# Patient Record
Sex: Female | Born: 1960 | Hispanic: No | Marital: Married | Smoking: Former smoker
Health system: Southern US, Community
[De-identification: ages and names within clinical notes are randomized; demographics above are authoritative.]

## PROBLEM LIST (undated history)

## (undated) DIAGNOSIS — G4700 Insomnia, unspecified: Secondary | ICD-10-CM

## (undated) DIAGNOSIS — R740 Nonspecific elevation of levels of transaminase and lactic acid dehydrogenase [LDH]: Secondary | ICD-10-CM

## (undated) DIAGNOSIS — L709 Acne, unspecified: Secondary | ICD-10-CM

## (undated) DIAGNOSIS — A64 Unspecified sexually transmitted disease: Secondary | ICD-10-CM

## (undated) DIAGNOSIS — F329 Major depressive disorder, single episode, unspecified: Secondary | ICD-10-CM

## (undated) DIAGNOSIS — D649 Anemia, unspecified: Secondary | ICD-10-CM

## (undated) DIAGNOSIS — F5000 Anorexia nervosa, unspecified: Secondary | ICD-10-CM

## (undated) HISTORY — DX: Major depressive disorder, single episode, unspecified: F32.9

## (undated) HISTORY — PX: BUNIONECTOMY: SHX129

## (undated) HISTORY — DX: Nonspecific elevation of levels of transaminase and lactic acid dehydrogenase (ldh): R74.0

## (undated) HISTORY — DX: Anorexia nervosa, unspecified: F50.00

## (undated) HISTORY — DX: Acne, unspecified: L70.9

## (undated) HISTORY — PX: DILITATION & CURRETTAGE/HYSTROSCOPY WITH HYDROTHERMAL ABLATION: SHX5570

## (undated) HISTORY — PX: BREAST ENHANCEMENT SURGERY: SHX7

## (undated) HISTORY — DX: Unspecified sexually transmitted disease: A64

## (undated) HISTORY — DX: Anemia, unspecified: D64.9

## (undated) HISTORY — DX: Insomnia, unspecified: G47.00

## (undated) HISTORY — PX: AUGMENTATION MAMMAPLASTY: SUR837

---

## 2004-01-31 ENCOUNTER — Other Ambulatory Visit: Admission: RE | Admit: 2004-01-31 | Discharge: 2004-01-31 | Payer: Self-pay | Admitting: Obstetrics and Gynecology

## 2004-03-11 ENCOUNTER — Encounter: Admission: RE | Admit: 2004-03-11 | Discharge: 2004-03-11 | Payer: Self-pay | Admitting: Obstetrics and Gynecology

## 2004-04-09 ENCOUNTER — Ambulatory Visit (HOSPITAL_COMMUNITY): Admission: RE | Admit: 2004-04-09 | Discharge: 2004-04-09 | Payer: Self-pay | Admitting: Obstetrics and Gynecology

## 2004-04-09 ENCOUNTER — Encounter (INDEPENDENT_AMBULATORY_CARE_PROVIDER_SITE_OTHER): Payer: Self-pay | Admitting: *Deleted

## 2004-06-04 ENCOUNTER — Ambulatory Visit (HOSPITAL_COMMUNITY): Admission: RE | Admit: 2004-06-04 | Discharge: 2004-06-04 | Payer: Self-pay | Admitting: Family Medicine

## 2005-03-27 ENCOUNTER — Other Ambulatory Visit: Admission: RE | Admit: 2005-03-27 | Discharge: 2005-03-27 | Payer: Self-pay | Admitting: Obstetrics and Gynecology

## 2005-05-16 ENCOUNTER — Encounter: Admission: RE | Admit: 2005-05-16 | Discharge: 2005-05-16 | Payer: Self-pay | Admitting: Obstetrics and Gynecology

## 2005-11-01 ENCOUNTER — Emergency Department (HOSPITAL_COMMUNITY): Admission: EM | Admit: 2005-11-01 | Discharge: 2005-11-01 | Payer: Self-pay | Admitting: Emergency Medicine

## 2006-08-26 ENCOUNTER — Encounter: Admission: RE | Admit: 2006-08-26 | Discharge: 2006-08-26 | Payer: Self-pay | Admitting: Obstetrics and Gynecology

## 2007-09-21 ENCOUNTER — Encounter: Admission: RE | Admit: 2007-09-21 | Discharge: 2007-09-21 | Payer: Self-pay | Admitting: Obstetrics and Gynecology

## 2007-10-01 ENCOUNTER — Encounter: Admission: RE | Admit: 2007-10-01 | Discharge: 2007-10-01 | Payer: Self-pay | Admitting: Obstetrics and Gynecology

## 2007-12-27 ENCOUNTER — Encounter: Payer: Self-pay | Admitting: Obstetrics and Gynecology

## 2007-12-27 ENCOUNTER — Ambulatory Visit (HOSPITAL_BASED_OUTPATIENT_CLINIC_OR_DEPARTMENT_OTHER): Admission: RE | Admit: 2007-12-27 | Discharge: 2007-12-27 | Payer: Self-pay | Admitting: Obstetrics and Gynecology

## 2007-12-27 HISTORY — PX: HYSTEROSCOPY: SHX211

## 2007-12-27 HISTORY — PX: ENDOMETRIAL ABLATION: SHX621

## 2008-05-10 LAB — HM MAMMOGRAPHY: HM Mammogram: NORMAL

## 2008-11-03 ENCOUNTER — Encounter: Admission: RE | Admit: 2008-11-03 | Discharge: 2008-11-03 | Payer: Self-pay | Admitting: Obstetrics and Gynecology

## 2008-11-26 LAB — CONVERTED CEMR LAB: Pap Smear: NORMAL

## 2009-03-04 ENCOUNTER — Emergency Department (HOSPITAL_COMMUNITY): Admission: EM | Admit: 2009-03-04 | Discharge: 2009-03-04 | Payer: Self-pay | Admitting: Emergency Medicine

## 2009-08-09 ENCOUNTER — Ambulatory Visit: Payer: Self-pay | Admitting: Family Medicine

## 2009-08-09 DIAGNOSIS — F3289 Other specified depressive episodes: Secondary | ICD-10-CM

## 2009-08-09 DIAGNOSIS — F329 Major depressive disorder, single episode, unspecified: Secondary | ICD-10-CM | POA: Insufficient documentation

## 2009-08-09 HISTORY — DX: Major depressive disorder, single episode, unspecified: F32.9

## 2009-08-09 HISTORY — DX: Other specified depressive episodes: F32.89

## 2009-08-13 ENCOUNTER — Telehealth: Payer: Self-pay | Admitting: Family Medicine

## 2009-08-13 LAB — CONVERTED CEMR LAB
ALT: 70 units/L — ABNORMAL HIGH (ref 0–35)
AST: 41 units/L — ABNORMAL HIGH (ref 0–37)
Albumin: 4 g/dL (ref 3.5–5.2)
Alkaline Phosphatase: 98 units/L (ref 39–117)
BUN: 13 mg/dL (ref 6–23)
Basophils Absolute: 0 10*3/uL (ref 0.0–0.1)
Basophils Relative: 1 % (ref 0.0–3.0)
Bilirubin, Direct: 0.1 mg/dL (ref 0.0–0.3)
CO2: 31 meq/L (ref 19–32)
Calcium: 9.4 mg/dL (ref 8.4–10.5)
Chloride: 98 meq/L (ref 96–112)
Cholesterol: 181 mg/dL (ref 0–200)
Creatinine, Ser: 0.6 mg/dL (ref 0.4–1.2)
Eosinophils Absolute: 0 10*3/uL (ref 0.0–0.7)
Eosinophils Relative: 0.9 % (ref 0.0–5.0)
GFR calc non Af Amer: 113.73 mL/min (ref >60)
Glucose, Bld: 84 mg/dL (ref 70–99)
HCT: 36.9 % (ref 36.0–46.0)
HDL: 29.6 mg/dL — ABNORMAL LOW (ref >39.00)
Hemoglobin: 12.9 g/dL (ref 12.0–15.0)
LDL Cholesterol: 136 mg/dL — ABNORMAL HIGH (ref 0–99)
Lymphocytes Relative: 29.8 % (ref 12.0–46.0)
Lymphs Abs: 1.4 10*3/uL (ref 0.7–4.0)
MCHC: 34.9 g/dL (ref 30.0–36.0)
MCV: 98 fL (ref 78.0–100.0)
Monocytes Absolute: 0.4 10*3/uL (ref 0.1–1.0)
Monocytes Relative: 8.1 % (ref 3.0–12.0)
Neutro Abs: 2.9 10*3/uL (ref 1.4–7.7)
Neutrophils Relative %: 60.2 % (ref 43.0–77.0)
Platelets: 483 10*3/uL — ABNORMAL HIGH (ref 150.0–400.0)
Potassium: 4 meq/L (ref 3.5–5.1)
RBC: 3.77 M/uL — ABNORMAL LOW (ref 3.87–5.11)
RDW: 13.5 % (ref 11.5–14.6)
Sodium: 138 meq/L (ref 135–145)
TSH: 1.75 microintl units/mL (ref 0.35–5.50)
Total Bilirubin: 0.3 mg/dL (ref 0.3–1.2)
Total CHOL/HDL Ratio: 6
Total Protein: 7.5 g/dL (ref 6.0–8.3)
Triglycerides: 78 mg/dL (ref 0.0–149.0)
VLDL: 15.6 mg/dL (ref 0.0–40.0)
WBC: 4.8 10*3/uL (ref 4.5–10.5)

## 2009-08-14 ENCOUNTER — Ambulatory Visit: Payer: Self-pay | Admitting: Family Medicine

## 2009-08-14 DIAGNOSIS — R740 Nonspecific elevation of levels of transaminase and lactic acid dehydrogenase [LDH]: Secondary | ICD-10-CM

## 2009-08-14 DIAGNOSIS — R7401 Elevation of levels of liver transaminase levels: Secondary | ICD-10-CM

## 2009-08-14 DIAGNOSIS — R21 Rash and other nonspecific skin eruption: Secondary | ICD-10-CM

## 2009-08-14 DIAGNOSIS — R7402 Elevation of levels of lactic acid dehydrogenase (LDH): Secondary | ICD-10-CM

## 2009-08-14 HISTORY — DX: Elevation of levels of lactic acid dehydrogenase (LDH): R74.02

## 2009-08-14 HISTORY — DX: Elevation of levels of liver transaminase levels: R74.01

## 2009-08-24 ENCOUNTER — Ambulatory Visit: Payer: Self-pay | Admitting: Family Medicine

## 2009-08-27 LAB — CONVERTED CEMR LAB
ALT: 39 units/L — ABNORMAL HIGH (ref 0–35)
AST: 37 units/L (ref 0–37)
Albumin: 3.8 g/dL (ref 3.5–5.2)
Alkaline Phosphatase: 66 units/L (ref 39–117)
Bilirubin, Direct: 0 mg/dL (ref 0.0–0.3)
Total Bilirubin: 0.4 mg/dL (ref 0.3–1.2)
Total Protein: 7.2 g/dL (ref 6.0–8.3)

## 2009-12-09 LAB — HM PAP SMEAR: HM Pap smear: NORMAL

## 2010-01-08 ENCOUNTER — Ambulatory Visit: Payer: Self-pay | Admitting: Family Medicine

## 2010-01-08 DIAGNOSIS — N390 Urinary tract infection, site not specified: Secondary | ICD-10-CM

## 2010-01-08 DIAGNOSIS — B373 Candidiasis of vulva and vagina: Secondary | ICD-10-CM

## 2010-01-08 LAB — CONVERTED CEMR LAB
Bilirubin Urine: NEGATIVE
Blood in Urine, dipstick: NEGATIVE
Glucose, Urine, Semiquant: NEGATIVE
Ketones, urine, test strip: NEGATIVE
Nitrite: NEGATIVE
Protein, U semiquant: NEGATIVE
Specific Gravity, Urine: 1.015
Urobilinogen, UA: 0.2
WBC Urine, dipstick: NEGATIVE
pH: 7

## 2010-01-09 ENCOUNTER — Encounter: Payer: Self-pay | Admitting: Family Medicine

## 2010-01-11 ENCOUNTER — Encounter: Payer: Self-pay | Admitting: Family Medicine

## 2010-01-14 ENCOUNTER — Encounter: Payer: Self-pay | Admitting: Family Medicine

## 2010-01-17 ENCOUNTER — Telehealth: Payer: Self-pay | Admitting: Family Medicine

## 2010-01-24 ENCOUNTER — Encounter: Admission: RE | Admit: 2010-01-24 | Discharge: 2010-01-24 | Payer: Self-pay | Admitting: Obstetrics and Gynecology

## 2010-02-07 ENCOUNTER — Ambulatory Visit: Payer: Self-pay | Admitting: Family Medicine

## 2010-02-07 LAB — CONVERTED CEMR LAB
Bilirubin Urine: NEGATIVE
Blood in Urine, dipstick: NEGATIVE
Glucose, Urine, Semiquant: NEGATIVE
Ketones, urine, test strip: NEGATIVE
Nitrite: NEGATIVE
Protein, U semiquant: NEGATIVE
Specific Gravity, Urine: 1.01
Urobilinogen, UA: 0.2
WBC Urine, dipstick: NEGATIVE
pH: 7

## 2010-02-23 ENCOUNTER — Ambulatory Visit: Payer: Self-pay | Admitting: Internal Medicine

## 2010-02-23 LAB — CONVERTED CEMR LAB
Blood in Urine, dipstick: NEGATIVE
Glucose, Urine, Semiquant: NEGATIVE
Ketones, urine, test strip: NEGATIVE
Nitrite: NEGATIVE
Protein, U semiquant: 30
Specific Gravity, Urine: 1.015
Urobilinogen, UA: 0.2
WBC Urine, dipstick: NEGATIVE
pH: 6.5

## 2010-05-02 ENCOUNTER — Encounter
Admission: RE | Admit: 2010-05-02 | Discharge: 2010-05-02 | Payer: Self-pay | Source: Home / Self Care | Attending: Gastroenterology | Admitting: Gastroenterology

## 2010-05-02 ENCOUNTER — Encounter: Payer: Self-pay | Admitting: Family Medicine

## 2010-05-19 ENCOUNTER — Encounter: Payer: Self-pay | Admitting: Obstetrics and Gynecology

## 2010-05-28 NOTE — Assessment & Plan Note (Signed)
Summary: RASH // RS   Vital Signs:  Patient profile:   50 year old female Menstrual status:  irregular Temp:     97.7 degrees F oral BP sitting:   92 / 64  (left arm) Cuff size:   regular  Vitals Entered By: Sid Falcon LPN (August 14, 2009 11:32 AM) CC: dry skin hand, groin area, reddened spots all over yesterday   History of Present Illness: Patient is seen for evaluation of rash. Approximately one week ago developed desquamating rash of her palms. This is nonpruritic and nonpainful. No mouth ulcerations. Yesterday had a different type rash which is described as some erythematous dots on her trunk which were nonpruritic and blanched with pressure. That rash is gone today.  No hx of hives. No known topical chemical exposures.  Patient does take Lamictal and Seroquel has been on these per her psychiatrist for approximately 6 months with no prior rash. Recent lab work significant for mildly elevated liver transaminases. Patient denies any fever or other problems.  Allergies: 1)  Percocet (Oxycodone-Acetaminophen) 2)  Cipro (Ciprofloxacin Hcl)  Past History:  Past Medical History: Last updated: 08/09/2009 Depression Eating disorder Hx Anorexia Nervosa  Review of Systems  The patient denies fever, chest pain, dyspnea on exertion, peripheral edema, headaches, hemoptysis, and muscle weakness.    Physical Exam  General:  alert, appropriate dress, cooperative to examination, and underweight appearing.   Eyes:  pupils equal, pupils round, and pupils reactive to light.   Ears:  External ear exam shows no significant lesions or deformities.  Otoscopic examination reveals clear canals, tympanic membranes are intact bilaterally without bulging, retraction, inflammation or discharge. Hearing is grossly normal bilaterally. Mouth:  no lesions or ulcerations noted Neck:  No deformities, masses, or tenderness noted. Lungs:  Normal respiratory effort, chest expands symmetrically. Lungs are  clear to auscultation, no crackles or wheezes. Heart:  Normal rate and regular rhythm. S1 and S2 normal without gallop, murmur, click, rub or other extra sounds. Skin:  patient has somewhat scaly rash palms of both hands. No evidence for ulceration.   punctate erythematous blanching rash which appears to be fading on her trunk. No evidence for urticaria.  no petechiae. Cervical Nodes:  No lymphadenopathy noted   Impression & Recommendations:  Problem # 1:  SKIN RASH (ICD-782.1) NO evidence for toxic epidermal necrolysis.  Doubt fungal.  Will consult with her psychiatrist regarding medications.  Problem # 2:  TRANSAMINASES, SERUM, ELEVATED (ICD-790.4) mild.  ?drug related.  Pt to get rececked in 2-3 weeks.  Complete Medication List: 1)  Lamictal 200 Mg Tabs (Lamotrigine) .... Once daily 2)  Seroquel Xr 150 Mg Xr24h-tab (Quetiapine fumarate) .... As needed 3)  Ambien 10 Mg Tabs (Zolpidem tartrate) .... As needed  Patient Instructions: 1)  follow up immediately if you develop any fever, mouth ulcerations, or worsening rash. 2)  I will notify your psychiatrist to make further decisions regarding your medication therapy

## 2010-05-28 NOTE — Assessment & Plan Note (Signed)
Summary: ? uti/cjr   Vital Signs:  Patient profile:   50 year old female Menstrual status:  irregular Height:      60 inches (152.40 cm) Weight:      102.31 pounds (46.50 kg) O2 Sat:      99 % on Room air Temp:     98.4 degrees F (36.89 degrees C) oral Pulse rate:   85 / minute BP sitting:   90 / 64  (left arm) Cuff size:   regular  Vitals Entered By: Josph Macho RMA (January 08, 2010 2:09 PM)  O2 Flow:  Room air CC: Possible UTI/ CF Is Patient Diabetic? No   History of Present Illness: patient is a 50 year old Caucasian female who is in today with complaints of mild dysuria. She reports symptoms have been present for the last several days. She relays a history of very mild dysuria earlier this year which quickly turned into an episode of pyelonephritis so she is excessively anxious about her current symptoms. She describes the discomfort as mild and more on the external surface. Tthere is a mild odor associated with the urine. She does also knowledge some vaginal itching and slight increase in vaginal discharge. The vaginal discharge is slightly malodorous whitish in nature. She is married and sexually active. She acknowledges some increasing urinary urgency but denies urinary frequency hematuria or incontinence. She denies fevers, chills, abdominal pain, back pain, chest pain, malaise, myalgias.  Current Medications (verified): 1)  Cymbalta 60 Mg Cpep (Duloxetine Hcl) .... One Daily 2)  Restoril 30 Mg Caps (Temazepam) .... Once Daily  Allergies (verified): 1)  Percocet (Oxycodone-Acetaminophen) 2)  Cipro (Ciprofloxacin Hcl)  Past History:  Past medical history reviewed for relevance to current acute and chronic problems. Social history (including risk factors) reviewed for relevance to current acute and chronic problems.  Past Medical History: Reviewed history from 08/09/2009 and no changes required. Depression Eating disorder Hx Anorexia Nervosa  Social  History: Reviewed history from 08/09/2009 and no changes required. Occupation:  Disease Management Nurse Married Alcohol use-no Religion affecting care Past smoker, quit 1989  Review of Systems      See HPI  Physical Exam  General:  Well-developed,well-nourished,in no acute distress; alert,appropriate and cooperative throughout examination Mouth:  Oral mucosa and oropharynx without lesions or exudates.  Lungs:  Normal respiratory effort, chest expands symmetrically. Lungs are clear to auscultation, no crackles or wheezes. Heart:  Normal rate and regular rhythm. S1 and S2 normal without gallop, murmur, click, rub or other extra sounds. Abdomen:  Bowel sounds positive,abdomen soft and non-tender without masses, organomegaly or hernias noted. Psych:  Cognition and judgment appear intact. Alert and cooperative with normal attention span and concentration. No apparent delusions, illusions, hallucinations   Impression & Recommendations:  Problem # 1:  CANDIDIASIS, VAGINAL (ICD-112.1)  Her updated medication list for this problem includes:    Fluconazole 150 Mg Tabs (Fluconazole) .Marland Kitchen... 1 tab by mouth q week as needed candidiasis Symptoms suggestive of yeast over growth, given instructions on good hydration and probiotic supplementation  Problem # 2:  UTI (ICD-599.0)  Orders: UA Dipstick w/o Micro (automated)  (81003) T-Culture, Urine ([address]268-34196) Has a h/o UTI and some mild dysuria noted, will send urine for culture, no strong indication of UTI on dip stick today  Complete Medication List: 1)  Cymbalta 60 Mg Cpep (Duloxetine hcl) .... One daily 2)  Restoril 30 Mg Caps (Temazepam) .... Once daily 3)  Fluconazole 150 Mg Tabs (Fluconazole) .Marland Kitchen.. 1 tab by mouth  q week as needed candidiasis 4)  Align 4 Mg Caps (Probiotic product) .Marland Kitchen.. 1 cap by mouth once daily  Patient Instructions: 1)  Please schedule a follow-up appointment as needed if symptoms worsen or do not improve. 2)  Drink  plenty of fluids up to 3-4 quarts a day. Cranberry juice is especially recommended in addition to large amounts of water. Avoid caffeine & carbonated drinks, they tend to irritate the bladder, Return in 3-5 days if you're not better: sooner if you're feeling worse.  3)  Try a probiotic such as Align caps daily for the next month and as needed after that. Prescriptions: ALIGN 4 MG CAPS (PROBIOTIC PRODUCT) 1 cap by mouth once daily  #7 x 0   Entered and Authorized by:   Danise Edge MD   Signed by:   Danise Edge MD on 01/08/2010   Method used:   Samples Given   RxID:   [id number] FLUCONAZOLE 150 MG TABS (FLUCONAZOLE) 1 tab by mouth q week as needed candidiasis  #2 x 0   Entered and Authorized by:   Danise Edge MD   Signed by:   Danise Edge MD on 01/08/2010   Method used:   [facility] to        [facility]  [place] [address] [facility]retail[facility]       [address]       [place], [place]  [address]       Ph: [phone] or [phone]       Fax: [phone]   RxID:   [id number]   Laboratory Results   Urine Tests    Routine Urinalysis   Color: yellow Appearance: Clear Glucose: negative   (Normal Range: Negative) Bilirubin: negative   (Normal Range: Negative) Ketone: negative   (Normal Range: Negative) Spec. Gravity: 1.015   (Normal Range: 1.003-1.035) Blood: negative   (Normal Range: Negative) pH: 7.0   (Normal Range: 5.0-8.0) Protein: negative   (Normal Range: Negative) Urobilinogen: 0.2   (Normal Range: 0-1) Nitrite: negative   (Normal Range: Negative) Leukocyte Esterace: negative   (Normal Range: Negative)    Comments: Rita Ohara  January 08, 2010 1:40 PM

## 2010-05-28 NOTE — Progress Notes (Signed)
  Phone Note Call from Patient   Caller: Patient Call For: [name] MD Complaint: Urinary/GYN Problems Summary of Call: New phone :  [phone] Initial call taken by: Lynann Beaver CMA,  January 17, 2010 1:29 PM

## 2010-05-28 NOTE — Assessment & Plan Note (Signed)
Summary: NEW PT EST // RS   Vital Signs:  Patient profile:   50 year old female Menstrual status:  irregular LMP:     05/29/2009 Height:      60 inches Weight:      100 pounds BMI:     19.60 Temp:     97.7 degrees F oral Pulse rate:   60 / minute Pulse rhythm:   regular Resp:     12 per minute BP sitting:   98 / 70  (left arm) Cuff size:   regular  Vitals Entered By: Sid Falcon LPN (August 09, 2009 10:29 AM) CC: New to establish  LMP (date): 05/29/2009     Menstrual Status irregular Enter LMP: 05/29/2009 Last PAP Result normal   History of Present Illness:  new patient to establish care.  Past medical history reviewed. Patient has history of anorexia over 30 years ago. Has had substantial weight loss this past year she estimates about 15 pounds but her weight has stabilized. She feels comfortable at her current weight. No history of bulimia. Remote history of anemia.  Previous C-section x3. History of depression treated by psychiatrist Lamictal and Seroquel. Question of bipolar illness but she is not sure of this diagnosis.  Currently depression symptoms stable.  Patient sees GYN regularly. Mammograms through their office. Tetanus up to date. Reported history low vitamin D on replacement per GYN. Last Pap smear last August.  Family history significant father bypass graft age 52 and history of stroke. Mother with hypertension.  Patient is married has 3 children. No history of smoking. No alcohol use. Works as a Tax adviser.  Preventive Screening-Counseling & Management  Alcohol-Tobacco     Smoking Status: quit     Year Quit: 1984  Allergies (verified): 1)  Percocet (Oxycodone-Acetaminophen) 2)  Cipro (Ciprofloxacin Hcl)  Past History:  Family History: Last updated: 08/09/2009 Mother, hypertension Father, heart disease (CABG62), stroke  Social History: Last updated: 08/09/2009 Occupation:  Disease Management Nurse Married Alcohol use-no Religion  affecting care Past smoker, quit 1989  Risk Factors: Smoking Status: quit (08/09/2009)  Past Medical History: Depression Eating disorder Hx Anorexia Nervosa  Past Surgical History: Three C- Sections, 1992, 1993, 1997  Family History: Mother, hypertension Father, heart disease (CABG62), stroke  Social History: Occupation:  Disease Management Nurse Married Alcohol use-no Religion affecting care Past smoker, quit 1989 Smoking Status:  quit Occupation:  employed  Review of Systems  The patient denies fever, vision loss, decreased hearing, hoarseness, chest pain, syncope, dyspnea on exertion, peripheral edema, prolonged cough, headaches, hemoptysis, abdominal pain, melena, hematochezia, severe indigestion/heartburn, hematuria, incontinence, genital sores, muscle weakness, suspicious skin lesions, transient blindness, difficulty walking, depression, unusual weight change, abnormal bleeding, enlarged lymph nodes, angioedema, and breast masses.         weight loss as per history of present illness. Patient states his weight has stabilized over the past several weeks  Physical Exam  General:  Well-developed,well-nourished,in no acute distress; alert,appropriate and cooperative throughout examination Head:  Normocephalic and atraumatic without obvious abnormalities. No apparent alopecia or balding. Eyes:  No corneal or conjunctival inflammation noted. EOMI. Perrla. Funduscopic exam benign, without hemorrhages, exudates or papilledema. Vision grossly normal. Ears:  External ear exam shows no significant lesions or deformities.  Otoscopic examination reveals clear canals, tympanic membranes are intact bilaterally without bulging, retraction, inflammation or discharge. Hearing is grossly normal bilaterally. Mouth:  Oral mucosa and oropharynx without lesions or exudates.  Teeth in good repair. Neck:  No deformities, masses,  or tenderness noted. Lungs:  Normal respiratory effort, chest  expands symmetrically. Lungs are clear to auscultation, no crackles or wheezes. Heart:  Normal rate and regular rhythm. S1 and S2 normal without gallop, murmur, click, rub or other extra sounds. Abdomen:  Bowel sounds positive,abdomen soft and non-tender without masses, organomegaly or hernias noted. Genitalia:  gyn Msk:  No deformity or scoliosis noted of thoracic or lumbar spine.   Neurologic:  alert & oriented X3, cranial nerves II-XII intact, strength normal in all extremities, and gait normal.   Skin:  Intact without suspicious lesions or rashes Psych:  good eye contact, not anxious appearing, and not depressed appearing.     Impression & Recommendations:  Problem # 1:  Preventive Health Care (ICD-V70.0) plan screening lab work. She will continue followup with GYN. Discussed importance of stabilizing her weight and regular food intake.  tetanus up to date.  Pt has hx low Vit D and she will continue with replacement.  Complete Medication List: 1)  Lamictal 200 Mg Tabs (Lamotrigine) .... Once daily 2)  Seroquel Xr 150 Mg Xr24h-tab (Quetiapine fumarate) .... As needed 3)  Ambien 10 Mg Tabs (Zolpidem tartrate) .... As needed  Other Orders: TLB-Lipid Panel (80061-LIPID) TLB-BMP (Basic Metabolic Panel-BMET) (80048-METABOL) TLB-CBC Platelet - w/Differential (85025-CBCD) TLB-Hepatic/Liver Function Pnl (80076-HEPATIC) TLB-TSH (Thyroid Stimulating Hormone) (84443-TSH) Venipuncture ([address])  Patient Instructions: 1)  It is important that you exercise reguarly at least 20 minutes 5 times a week. If you develop chest pain, have severe difficulty breathing, or feel very tired, stop exercising immediately and seek medical attention.  2)  Take calcium +vitamin D daily.   Preventive Care Screening  Pap Smear:    Date:  11/26/2008    Results:  normal   Mammogram:    Date:  04/28/2008    Results:  normal   Last Tetanus Booster:    Date:  07/28/2007    Results:  Historical

## 2010-05-28 NOTE — Letter (Signed)
Summary: Generic Letter  [place] at [facility]  [address] [place], [place] [address]   Phone: [phone]  Fax: [phone]    01/11/2010  TYLASIA FLETCHALL [address] [place], [place]  [address]  Dear Ms. Hottel,  Our office tried to reach you and was unsuccessful. We were wanting to notify you that your urine grew yeast. You should be feeling better with the Fluconazole. If not you could give [place] another urine sample so we can retest.  Any questions please contact us at [phone]. Also please let us know of an updated phone number.     Sincerely,   Josph Macho RMA

## 2010-05-28 NOTE — Letter (Signed)
Summary: Generic Letter  [place] at [facility]  [address] [place], [place] [address]   Phone: [phone]  Fax: [phone]    01/14/2010  GERRICA CYGAN [address] [place], [place]  [address]  Dear Ms. Obyrne,  Our office has tried to contact you to let you know we received your labs. The final culture came back recommending treatment with for your UTI. Would treat with Macrobid two times a day x 5days and repeat UA in 3-4 weeks.  Please contact our office at [phone] for a lab appointment and to also update our information with the correct phone number. Also if you have any questions please feel free to contact us.      Sincerely,   Josph Macho RMA

## 2010-05-28 NOTE — Progress Notes (Signed)
Summary: labs  Phone Note Call from Patient   Caller: Patient Call For: [name] MD Summary of Call: [phone] Pt would like to schedule liver function labs in 2 weeks rather than 3 as she is starting a new job and needs to know if this is ok with Dr. Caryl Never. Initial call taken by: Lynann Beaver CMA,  August 13, 2009 9:16 AM  Follow-up for Phone Call        yes  Pt. notified. Lynann Beaver [facility]  August 13, 2009 9:45 AM  Follow-up by: [name] MD,  August 13, 2009 9:43 AM

## 2010-05-28 NOTE — Assessment & Plan Note (Signed)
Summary: UTI SYMPTOMS...AS.   Vital Signs:  Patient profile:   50 year old female Menstrual status:  irregular Height:      60 inches Weight:      105 pounds O2 Sat:      97 % Temp:     97.4 degrees F Pulse rate:   61 / minute BP sitting:   102 / 60  (left arm) Cuff size:   regular  Vitals Entered By: Heather Davila (February 23, 2010 10:24 AM) CC: UTI?/pb   History of Present Illness: Feels she has another UTI still concerned due to history of pyelonephritis in spring  Recent urine and non diagnostic culture 5 days of macrobid  Having some foul smelling urine Now with bad "splitting headache" which she relates to UTI  some urgency and frequency No dysuria No hematuria  No dyspareunia Does note more vaginal dryness---not clear if symptoms may be post-coital    Current Medications (verified): 1)  Cymbalta 60 Mg Cpep (Duloxetine Hcl) .... One Daily 2)  Restoril 30 Mg Caps (Temazepam) .... Once Daily 3)  Fluconazole 150 Mg Tabs (Fluconazole) .Marland Kitchen.. 1 Tab By Mouth Q Week As Needed Candidiasis 4)  Align 4 Mg Caps (Probiotic Product) .Marland Kitchen.. 1 Cap By Mouth Once Daily 5)  Macrobid 100 Mg Caps (Nitrofurantoin Monohyd Macro) .Marland Kitchen.. 1 Capsule Two Times A Day X5 Days  Allergies (verified): 1)  Percocet (Oxycodone-Acetaminophen) 2)  Cipro (Ciprofloxacin Hcl)  Past History:  Past medical, surgical, family and social histories (including risk factors) reviewed for relevance to current acute and chronic problems.  Past Medical History: Reviewed history from 08/09/2009 and no changes required. Depression Eating disorder Hx Anorexia Nervosa  Past Surgical History: Reviewed history from 08/09/2009 and no changes required. Three C- Sections, 1992, 1993, 1997  Family History: Reviewed history from 08/09/2009 and no changes required. Mother, hypertension Father, heart disease (CABG62), stroke  Social History: Reviewed history from 08/09/2009 and no changes  required. Occupation:  Disease Management Nurse Married Alcohol use-no Religion affecting care Past smoker, quit 1989  Review of Systems       No vaginal discharge no fever  Physical Exam  General:  alert and normal appearance.   Abdomen:  soft and non-tender.   Msk:  no CVA tenderness   Impression & Recommendations:  Problem # 1:  ACUTE CYSTITIS (ICD-595.0) Assessment Heather Davila  not sure why she is having recurrent bladder symptoms No clear vaginal symptoms (though should consider vaginosis if recurrences continue) ??post coital problems  P: try amoxil for 3 days and then use before sex  Orders: UA Dipstick w/o Micro (manual) (03474)  Complete Medication List: 1)  Cymbalta 60 Mg Cpep (Duloxetine hcl) .... One daily 2)  Restoril 30 Mg Caps (Temazepam) .... Once daily 3)  Fluconazole 150 Mg Tabs (Fluconazole) .Marland Kitchen.. 1 tab by mouth q week as needed candidiasis 4)  Align 4 Mg Caps (Probiotic product) .Marland Kitchen.. 1 cap by mouth once daily 5)  Macrobid 100 Mg Caps (Nitrofurantoin monohyd macro) .Marland Kitchen.. 1 capsule two times a day x5 days 6)  Amoxicillin 500 Mg Tabs (Amoxicillin) .Marland Kitchen.. 1 tab by mouth two times a day for 3 days, then take 1 tab before sex  Patient Instructions: 1)  Please take the antibiotic two times a day for 3 days, then take before sex 2)  Please schedule a follow-up appointment as needed .  Prescriptions: AMOXICILLIN 500 MG TABS (AMOXICILLIN) 1 tab by mouth two times a day for 3 days, then take 1  tab before sex  #20 x 0   Entered and Authorized by:   Heather Salt MD   Signed by:   Heather Salt MD on 02/23/2010   Method used:   [facility]        [facility]  [place] [address] [facility]       [address]       [place], [place]  [address]       Ph: [phone] or [phone]       Fax: [phone]   RxID:   [id number]    Orders Added: 1)  Est. Patient Level III [27253] 2)  UA Dipstick w/o Micro (manual) [81002]    Laboratory Results   Urine  Tests   Date/Time Reported: Heather Davila  February 23, 2010 10:36 AM   Routine Urinalysis   Color: yellow Appearance: Clear Glucose: negative   (Normal Range: Negative) Bilirubin: moderate   (Normal Range: Negative) Ketone: negative   (Normal Range: Negative) Spec. Gravity: 1.015   (Normal Range: 1.003-1.035) Blood: negative   (Normal Range: Negative) pH: 6.5   (Normal Range: 5.0-8.0) Protein: 30   (Normal Range: Negative) Urobilinogen: 0.2   (Normal Range: 0-1) Nitrite: negative   (Normal Range: Negative) Leukocyte Esterace: negative   (Normal Range: Negative)

## 2010-06-05 ENCOUNTER — Ambulatory Visit: Payer: Self-pay | Admitting: Family Medicine

## 2010-07-03 ENCOUNTER — Other Ambulatory Visit: Payer: BC Managed Care – PPO | Admitting: Family Medicine

## 2010-07-03 DIAGNOSIS — Z0000 Encounter for general adult medical examination without abnormal findings: Secondary | ICD-10-CM

## 2010-07-03 LAB — BASIC METABOLIC PANEL
BUN: 16 mg/dL (ref 6–23)
CO2: 28 mEq/L (ref 19–32)
Calcium: 9.5 mg/dL (ref 8.4–10.5)
Chloride: 100 mEq/L (ref 96–112)
Creatinine, Ser: 0.6 mg/dL (ref 0.4–1.2)
GFR: 109.09 mL/min (ref >60.00)
Glucose, Bld: 81 mg/dL (ref 70–99)
Potassium: 4.5 mEq/L (ref 3.5–5.1)
Sodium: 136 mEq/L (ref 135–145)

## 2010-07-03 LAB — CBC WITH DIFFERENTIAL/PLATELET
Basophils Absolute: 0 10*3/uL (ref 0.0–0.1)
Basophils Relative: 1 % (ref 0.0–3.0)
Eosinophils Absolute: 0.1 10*3/uL (ref 0.0–0.7)
Eosinophils Relative: 2.4 % (ref 0.0–5.0)
HCT: 39.3 % (ref 36.0–46.0)
Hemoglobin: 13.9 g/dL (ref 12.0–15.0)
Lymphocytes Relative: 23.8 % (ref 12.0–46.0)
Lymphs Abs: 1 10*3/uL (ref 0.7–4.0)
MCHC: 35.3 g/dL (ref 30.0–36.0)
MCV: 99 fl (ref 78.0–100.0)
Monocytes Absolute: 0.4 10*3/uL (ref 0.1–1.0)
Monocytes Relative: 9.7 % (ref 3.0–12.0)
Neutro Abs: 2.6 10*3/uL (ref 1.4–7.7)
Neutrophils Relative %: 63.1 % (ref 43.0–77.0)
Platelets: 202 10*3/uL (ref 150.0–400.0)
RBC: 3.97 Mil/uL (ref 3.87–5.11)
RDW: 12.4 % (ref 11.5–14.6)
WBC: 4.2 10*3/uL — ABNORMAL LOW (ref 4.5–10.5)

## 2010-07-03 LAB — LIPID PANEL
Cholesterol: 181 mg/dL (ref 0–200)
HDL: 36.1 mg/dL — ABNORMAL LOW (ref >39.00)
LDL Cholesterol: 128 mg/dL — ABNORMAL HIGH (ref 0–99)
Total CHOL/HDL Ratio: 5
Triglycerides: 84 mg/dL (ref 0.0–149.0)
VLDL: 16.8 mg/dL (ref 0.0–40.0)

## 2010-07-03 LAB — HEPATIC FUNCTION PANEL
ALT: 48 U/L — ABNORMAL HIGH (ref 0–35)
AST: 31 U/L (ref 0–37)
Albumin: 4.3 g/dL (ref 3.5–5.2)
Alkaline Phosphatase: 49 U/L (ref 39–117)
Bilirubin, Direct: 0.1 mg/dL (ref 0.0–0.3)
Total Bilirubin: 0.5 mg/dL (ref 0.3–1.2)
Total Protein: 6.8 g/dL (ref 6.0–8.3)

## 2010-07-03 LAB — POCT URINALYSIS DIPSTICK
Bilirubin, UA: NEGATIVE
Blood, UA: NEGATIVE
Glucose, UA: NEGATIVE
Ketones, UA: NEGATIVE
Leukocytes, UA: NEGATIVE
Nitrite, UA: NEGATIVE
Protein, UA: NEGATIVE
Spec Grav, UA: 1.01
Urobilinogen, UA: 0.2
pH, UA: 7.5

## 2010-07-03 LAB — TSH: TSH: 2.3 u[IU]/mL (ref 0.35–5.50)

## 2010-07-08 ENCOUNTER — Other Ambulatory Visit: Payer: Self-pay

## 2010-07-09 ENCOUNTER — Encounter: Payer: Self-pay | Admitting: Family Medicine

## 2010-07-10 ENCOUNTER — Ambulatory Visit (INDEPENDENT_AMBULATORY_CARE_PROVIDER_SITE_OTHER): Payer: BC Managed Care – PPO | Admitting: Family Medicine

## 2010-07-10 ENCOUNTER — Encounter: Payer: Self-pay | Admitting: Family Medicine

## 2010-07-10 VITALS — BP 80/58 | HR 60 | Temp 98.6°F | Resp 12 | Ht 60.5 in | Wt 106.0 lb

## 2010-07-10 DIAGNOSIS — Z0000 Encounter for general adult medical examination without abnormal findings: Secondary | ICD-10-CM

## 2010-07-10 NOTE — Patient Instructions (Signed)
Continue with weight bearing exercise. Continue with calcium and Vit D supplementation.

## 2010-07-10 NOTE — Progress Notes (Signed)
  Subjective:    Patient ID: Heather Davila, female    DOB: 05-Sep-1960, 50 y.o.   MRN: [id number]  HPI  patient seen for wellness visit. Sees gynecologist regularly. Pap smear this past August normal.  Recent evaluation for some swallowing difficulties. Barium swallow and endoscopy performed. Apparently had esophageal ulcer. Question of silent reflux. Patient on Prilosec. Followup with gastroenterology.   Tetanus 2009.   History of depression treated with Cymbalta. Sees psychiatrist. Depression symptoms stable.   Tends to run low blood pressure. Taking no medications which would effect. Sometimes has dizziness with standing. Remote history of anemia but none now.  Walks some for exercise.   Review of Systems  Constitutional: Negative for fever, activity change, appetite change and fatigue.  HENT: Negative for hearing loss, ear pain, congestion, sore throat, voice change and sinus pressure.   Eyes: Negative for visual disturbance.  Respiratory: Negative for cough and shortness of breath.   Cardiovascular: Negative for chest pain and palpitations.  Gastrointestinal: Negative for abdominal pain, diarrhea, constipation and blood in stool.  Genitourinary: Negative for dysuria and hematuria.  Musculoskeletal: Negative for myalgias, back pain and arthralgias.  Skin: Negative for rash.  Neurological: Positive for dizziness. Negative for syncope and headaches.  Hematological: Negative for adenopathy.  Psychiatric/Behavioral: Negative for confusion and dysphoric mood.       Objective:   Physical Exam  Constitutional: She is oriented to person, place, and time. She appears well-developed and well-nourished.  HENT:  Head: Normocephalic and atraumatic.  Right Ear: External ear normal.  Left Ear: External ear normal.  Mouth/Throat: No oropharyngeal exudate.  Eyes: Conjunctivae and EOM are normal. Pupils are equal, round, and reactive to light. Right eye exhibits no discharge. Left eye  exhibits no discharge.  Neck: Normal range of motion. Neck supple. No thyromegaly present.  Cardiovascular: Normal rate, regular rhythm and normal heart sounds.  Exam reveals no gallop.   No murmur heard. Pulmonary/Chest: Effort normal and breath sounds normal. She has no wheezes. She has no rales.        Breast exam per GYN  Abdominal: Soft. Bowel sounds are normal. She exhibits no distension and no mass. There is no tenderness. There is no rebound and no guarding.  Genitourinary:        Per GYN  Musculoskeletal: Normal range of motion. She exhibits no edema and no tenderness.  Lymphadenopathy:    She has no cervical adenopathy.  Neurological: She is oriented to person, place, and time. No cranial nerve deficit.  Skin: Skin is warm. No rash noted.  Psychiatric: She has a normal mood and affect.          Assessment & Plan:   complete physical. Labs reviewed with patient. Low HDL which is chronic. Continue gynecologic followup. Reviewed appropriate levels of calcium and vitamin D. Recommend consistent weightbearing exercise. Tetanus up to date.

## 2010-07-18 ENCOUNTER — Encounter: Payer: Self-pay | Admitting: Family Medicine

## 2010-07-23 ENCOUNTER — Encounter: Payer: Self-pay | Admitting: Family Medicine

## 2010-07-29 ENCOUNTER — Telehealth: Payer: Self-pay

## 2010-07-29 NOTE — Telephone Encounter (Signed)
Pt dropped off 2 physical forms to be filled out on different days. She picked up one form and was told that the other form was lost and for pt to call and talk with Harriett Sine about locating the other form. Pt wants a call back about the other form or when form is found and ready to pick up.

## 2010-07-29 NOTE — Telephone Encounter (Signed)
I am not aware of a 2nd form, are you?

## 2010-07-29 NOTE — Telephone Encounter (Signed)
I never saw a second form.

## 2010-07-30 NOTE — Telephone Encounter (Signed)
Pt informed and she states she faxed the 2nd form last night.  Will be on the look-out for it

## 2010-07-30 NOTE — Telephone Encounter (Signed)
Form received, on Dr [employer] desk

## 2010-07-31 LAB — CBC
HCT: 41.4 % (ref 36.0–46.0)
Hemoglobin: 14.3 g/dL (ref 12.0–15.0)
MCHC: 34.5 g/dL (ref 30.0–36.0)
MCV: 97.2 fL (ref 78.0–100.0)
Platelets: 236 10*3/uL (ref 150–400)
RBC: 4.26 MIL/uL (ref 3.87–5.11)
RDW: 12.6 % (ref 11.5–15.5)
WBC: 5.3 10*3/uL (ref 4.0–10.5)

## 2010-07-31 LAB — DIFFERENTIAL
Basophils Relative: 1 % (ref 0–1)
Eosinophils Absolute: 0 10*3/uL (ref 0.0–0.7)
Eosinophils Relative: 1 % (ref 0–5)
Lymphs Abs: 1 10*3/uL (ref 0.7–4.0)
Monocytes Absolute: 0.3 10*3/uL (ref 0.1–1.0)
Monocytes Relative: 5 % (ref 3–12)

## 2010-07-31 LAB — BASIC METABOLIC PANEL
BUN: 11 mg/dL (ref 6–23)
CO2: 27 mEq/L (ref 19–32)
Calcium: 9.7 mg/dL (ref 8.4–10.5)
Chloride: 99 mEq/L (ref 96–112)
Creatinine, Ser: 0.65 mg/dL (ref 0.4–1.2)
GFR calc Af Amer: >60 mL/min (ref >60)
GFR calc non Af Amer: >60 mL/min (ref >60)
Glucose, Bld: 121 mg/dL — ABNORMAL HIGH (ref 70–99)
Potassium: 3.6 mEq/L (ref 3.5–5.1)
Sodium: 135 mEq/L (ref 135–145)

## 2010-07-31 LAB — POCT CARDIAC MARKERS
CKMB, poc: 1.4 ng/mL (ref 1.0–8.0)
Myoglobin, poc: 60 ng/mL (ref 12–200)
Myoglobin, poc: 67.7 ng/mL (ref 12–200)
Troponin i, poc: <0.05 ng/mL (ref 0.00–0.09)

## 2010-07-31 NOTE — Telephone Encounter (Signed)
Pt came to pick up form, last PPD 04/2009.  Will scan form to pt chart

## 2010-08-21 ENCOUNTER — Encounter: Payer: Self-pay | Admitting: Family Medicine

## 2010-08-27 ENCOUNTER — Encounter: Payer: Self-pay | Admitting: Family Medicine

## 2010-09-10 NOTE — Op Note (Signed)
NAMECARY, Heather Davila               ACCOUNT NO.:  [id number]   MEDICAL RECORD NO.:  [id number]          PATIENT TYPE:  AMB   LOCATION:  [facility]                         FACILITY:  [facility]   PHYSICIAN:  Cynthia P. Romine, M.D.DATE OF BIRTH:  Feb 10, 1961   DATE OF PROCEDURE:  12/27/2007  DATE OF DISCHARGE:                               OPERATIVE REPORT   PREOPERATIVE DIAGNOSIS:  Menorrhagia and known small endometrial polyps.   POSTOPERATIVE DIAGNOSIS:  Menorrhagia and known small endometrial  polyps, pathology pending.   PROCEDURE:  Hydrothermablation and hysteroscopy with resection of  endometrial polyp.   SURGEON:  Dr. Arline Asp Romine.   ANESTHESIA:  General.   ESTIMATED BLOOD LOSS:  Minimal.   COMPLICATIONS:  None.   PROCEDURE:  The patient was taken to the operating room and after the  induction of adequate general anesthesia was placed in dorsal lithotomy  position and prepped and draped in usual fashion.  The bladder was  drained with a red rubber catheter.  Posterior weighted and anterior  Sims retractors were placed.  The cervix was grasped on its anterior and  posterior lips with a single-tooth tenaculum.  Cervix was dilated to a  #23.  The hysteroscope for use with the hydrothermablation technique was  introduced into the cavity, and because of a kink in the tubing in the  system, visualization was poor.  It was necessary to change the tubing  in the HTA device, and after this was changed, visualization was  excellent.  The tubal ostia could be seen.  A small anterior fundal  polyp was also visualized.  Photographic documentation was taken.  Scope  was withdrawn to a point that was just inside the internal os, and  hydrothermablation was carried out in the usual fashion without  incident.  When the fluid was clear, the scope was withdrawn, and the  cervix was dilated to a #31 [employer].  An operating hysteroscope was then  introduced.  Sorbitol was used as a distention medium with  a pump, the  pressure on the pump being at 70 mmHg.  Sorbitol was introduced into the  endometrial cavity.  The anterior fundal polyp was resected with a  single loop.  The cavity was  then clean.  It well ablated.  Photographic documentation was taken  again.  The instrument was removed, and the polyp was removed and sent  to pathology.  Instruments removed from vagina, and the procedure was  terminated.  The patient tolerated it well and went in satisfactory  condition to post-anesthesia recovery.      Cynthia P. Romine, M.D.  Electronically Signed     CPR/MEDQ  D:  12/27/2007  T:  12/27/2007  Job:  [id number]   cc:   Edwena Felty. Romine, M.D.  Fax: [phone]

## 2010-09-13 NOTE — Op Note (Signed)
NAMEMCKENA, CHERN               ACCOUNT NO.:  [id number]   MEDICAL RECORD NO.:  [id number]          PATIENT TYPE:  AMB   LOCATION:  [facility]                           FACILITY:  [facility]   PHYSICIAN:  Carrington Clamp, M.D. DATE OF BIRTH:  1961-03-18   DATE OF PROCEDURE:  04/09/2004  DATE OF DISCHARGE:                                 OPERATIVE REPORT   PREOPERATIVE DIAGNOSES:  1.  Metrorrhagia.  2.  Endometrial polyps.   POSTOPERATIVE DIAGNOSES:  1.  Metrorrhagia.  2.  Endometrial polyps.   PROCEDURE:  Dilation and curettage and hysteroscopy.   ATTENDING:  Carrington Clamp, M.D.   ANESTHESIA:  MAC.   ESTIMATED BLOOD LOSS:  Less than 100 mL.   FLUIDS REPLACED:  __________.  Urine output was not measured.   COMPLICATIONS:  A perforated uterus.  There was a small cervical laceration.   FINDINGS:  Multiple endometrial polyps.  No fibroids seen in the endometrial  cavity.  Perforation was done with a NovaSure ablation instrument, but the  perforation was not bleeding for several minutes after the event.  The  NovaSure ablation was then abandoned.  Fluid imbalance was approximately 200  mL.   MEDICATIONS:  1% lidocaine to the cervix.   PATHOLOGY:  Uterine curettings.   COUNTS:  Correct x3.   TECHNIQUE:  After adequate anesthesia was achieved, the patient was prepped  and draped in the usual sterile fashion in the dorsal lithotomy position.  The bladder was emptied with a red rubber catheter.  The cervix was easily  dilated with Shawnie Pons dilators until the hysteroscope could be passed.  The  hysteroscope was passed into the uterine cavity and the above findings  noted.  The polypectomy was performed, followed by a curettage with a sharp  curette.  Material was obtained and sent to pathology.  The hysteroscope was  passed again, and the uterine cavity appeared to be cleaner.  The  measurements were then taken for the cavity length.  A sounding length of 11  cm was obtained, a  cervical length of 4.5 cm was obtained, which gave a  cavity length of 6.5, 6.5 was set on the NovaSure instrument, and it was  introduced into the endometrial cavity.  The instrument was deployed several  times but, unfortunately, the width of the cavity would go no further than 2  cm.  This to me did not seem correct because the cavity seemed much wider  than that.  On the third attempt to deploy, a pop was heard.  The instrument  was immediately withdrawn and the hysteroscope was passed into the uterus.  The hysteroscope was able to find the perforation and bowel was seen with  the hysteroscope.  The NovaSure was then abandoned and a few minutes were  awaited while we checked to make sure there was no additional bleeding from  the cervix.  There was some bleeding from a small cervical tear secondary to  the tenaculum being torn through.  Two stitches of 2-0 Vicryl were performed  to ensure hemostasis.  The hysteroscope was passed back into the uterine  cavity and no active bleeding was seen.  The perforation was obviously  stable and the hysteroscope was removed.  All instruments were then  withdrawn from the uterus and vagina.  The patient tolerated the procedure  well and was returned to the recovery room in stable condition.      MH/MEDQ  D:  04/09/2004  T:  04/09/2004  Job:  [id number]

## 2010-10-02 ENCOUNTER — Encounter: Payer: Self-pay | Admitting: Family Medicine

## 2010-10-02 ENCOUNTER — Ambulatory Visit (INDEPENDENT_AMBULATORY_CARE_PROVIDER_SITE_OTHER): Payer: BC Managed Care – PPO | Admitting: Family Medicine

## 2010-10-02 VITALS — BP 90/60 | Temp 97.4°F | Wt 104.0 lb

## 2010-10-02 DIAGNOSIS — R4189 Other symptoms and signs involving cognitive functions and awareness: Secondary | ICD-10-CM

## 2010-10-02 NOTE — Progress Notes (Signed)
  Subjective:    Patient ID: Heather Davila, female    DOB: June 21, 1960, 50 y.o.   MRN: [id number]  HPI Patient here with concern for short-term memory loss. She gives as an example difficulty with name recall. She had a grandmother with Alzheimer's disease otherwise no known family history of dementia. She denied headaches or any focal neurologic symptoms. She thinks symptoms present for about a year. She had several recent screening labs including TSH which was normal. No history of anemia or any concern for B12 deficiency. She denies any head injury. She has no history of seizures.  History of chronic insomnia and depression which has been treated by psychiatrist. She currently takes Cymbalta and Restoril for sleep.   Review of Systems  Constitutional: Negative for activity change, appetite change and unexpected weight change.  Eyes: Negative for visual disturbance.  Respiratory: Negative for cough and shortness of breath.   Cardiovascular: Negative for chest pain, palpitations and leg swelling.  Gastrointestinal: Negative for abdominal pain.  Genitourinary: Negative for dysuria.  Neurological: Negative for dizziness, syncope, weakness, light-headedness and headaches.  Hematological: Negative for adenopathy. Does not bruise/bleed easily.  Psychiatric/Behavioral: Positive for sleep disturbance. Negative for suicidal ideas, confusion, dysphoric mood and agitation.       Objective:   Physical Exam  Constitutional: She is oriented to person, place, and time. She appears well-developed and well-nourished. No distress.  HENT:  Right Ear: External ear normal.  Left Ear: External ear normal.  Mouth/Throat: Oropharynx is clear and moist.  Eyes: Pupils are equal, round, and reactive to light.  Cardiovascular: Normal rate, regular rhythm and normal heart sounds.   No murmur heard. Pulmonary/Chest: Effort normal and breath sounds normal. No respiratory distress. She has no wheezes. She has no  rales.  Musculoskeletal: She exhibits no edema.  Neurological: She is alert and oriented to person, place, and time. No cranial nerve deficit.       Cerebellar normal finger to nose testing No focal strength deficits  Skin: No rash noted.  Psychiatric: She has a normal mood and affect. Her behavior is normal.          Assessment & Plan:  Concern for short-term memory loss. Mini-Mental status exam reveals perfect score of 30 out of 30. Recent thyroid normal.  Nonfocal neurologic exam. We have not recommended any kind of brain scan at this time.  Consider CT of head or MRI if she develops any severe headaches or any new neurologic symptoms. We also discussed possible role of fatigue, anxiety, and/or depression may contribute to her symptoms

## 2010-10-02 NOTE — Patient Instructions (Signed)
Be in touch if you note any progressive memory problems or any focal neurologic concerns such as focal weakness, visual changes, or any new headaches.

## 2011-09-02 ENCOUNTER — Ambulatory Visit (HOSPITAL_COMMUNITY): Payer: BC Managed Care – PPO | Admitting: Psychiatry

## 2011-09-05 ENCOUNTER — Ambulatory Visit (HOSPITAL_COMMUNITY): Payer: BC Managed Care – PPO | Admitting: Psychiatry

## 2011-09-08 ENCOUNTER — Other Ambulatory Visit: Payer: Self-pay | Admitting: Family Medicine

## 2011-09-08 DIAGNOSIS — R945 Abnormal results of liver function studies: Secondary | ICD-10-CM

## 2011-09-10 ENCOUNTER — Other Ambulatory Visit: Payer: BC Managed Care – PPO

## 2011-10-27 ENCOUNTER — Encounter: Payer: Self-pay | Admitting: Family Medicine

## 2012-01-08 ENCOUNTER — Encounter: Payer: Self-pay | Admitting: Family Medicine

## 2012-01-08 ENCOUNTER — Ambulatory Visit (INDEPENDENT_AMBULATORY_CARE_PROVIDER_SITE_OTHER): Payer: BC Managed Care – PPO | Admitting: Family Medicine

## 2012-01-08 VITALS — BP 98/60 | Temp 97.0°F | Wt 102.0 lb

## 2012-01-08 DIAGNOSIS — M549 Dorsalgia, unspecified: Secondary | ICD-10-CM

## 2012-01-08 DIAGNOSIS — L03119 Cellulitis of unspecified part of limb: Secondary | ICD-10-CM

## 2012-01-08 LAB — POCT URINALYSIS DIPSTICK
Ketones, UA: NEGATIVE
Leukocytes, UA: NEGATIVE
Protein, UA: NEGATIVE
Urobilinogen, UA: 0.2
pH, UA: 6

## 2012-01-08 NOTE — Progress Notes (Signed)
  Subjective:    Patient ID: Heather Davila, female    DOB: 1961-03-19, 51 y.o.   MRN: [id number]  HPI  Patient seen with couple day history of nonspecific low back pain. She has some mild dizziness and felt similar with UTIs previously. No burning with urination. No urinary frequency. Patient also started Keflex from son's prescription yesterday. She had recent pedicure and had abrasion of her right heel and noticed some redness of the heel. No fever or chills. Redness right foot is already improving with one-day of Keflex. No nausea or vomiting.  Patient reports diagnosed with fibromyalgia and chronic fatigue syndrome during the past year. She is seen holistic medicine doctor for several lots of non-FDA supplements  Past Medical History  Diagnosis Date  . CANDIDIASIS, VAGINAL 01/08/2010  . DEPRESSION 08/09/2009  . TRANSAMINASES, SERUM, ELEVATED 08/14/2009  . Anorexia nervosa   . Anemia   . Insomnia    Past Surgical History  Procedure Date  . Cesarean section     X 3    reports that she quit smoking about 33 years ago. Her smoking use included Cigarettes. She has a 2.5 pack-year smoking history. She does not have any smokeless tobacco history on file. Her alcohol and drug histories not on file. family history includes Heart disease in her father; Hypertension in her mother; and Stroke in her father. Allergies  Allergen Reactions  . Ciprofloxacin     REACTION: hives  . Oxycodone-Acetaminophen     REACTION: hives      Review of Systems  Constitutional: Positive for fatigue. Negative for fever and chills.  Gastrointestinal: Negative for nausea, vomiting and abdominal pain.  Genitourinary: Negative for hematuria.  Musculoskeletal: Positive for back pain.       Objective:   Physical Exam  Constitutional: She appears well-developed and well-nourished.  Cardiovascular: Normal rate and regular rhythm.   Pulmonary/Chest: Effort normal and breath sounds normal. No respiratory  distress. She has no wheezes. She has no rales.  Musculoskeletal: She exhibits no edema.       Straight leg raises are negative bilaterally  Neurological:       Symmetric reflexes knee and ankle. Full-strength lower extremities  Skin:       Mild erythema and warmth and tenderness right heel. This covers an area approximately 3 x 3 cm. No pustules. No fluctuance          Assessment & Plan:  #1 low back pain. Nonfocal exam. Urine dipstick unremarkable. No evidence for UTI.  #2 early cellulitis right heel. Finish out Keflex 500 mg 3 times a day for 7 days. Followup as needed

## 2012-01-08 NOTE — Patient Instructions (Addendum)
Follow up immediately for any increased fever or other concerns

## 2012-05-31 LAB — HM PAP SMEAR: HM PAP: NEGATIVE

## 2012-06-01 LAB — HM MAMMOGRAPHY

## 2012-06-29 ENCOUNTER — Ambulatory Visit: Payer: Self-pay | Admitting: Family Medicine

## 2012-07-05 ENCOUNTER — Telehealth: Payer: Self-pay

## 2012-07-05 NOTE — Telephone Encounter (Signed)
What does she need? She needs copy was seen on I/A. She wants to have this mailed to her. This is done. Amy

## 2012-07-05 NOTE — Telephone Encounter (Signed)
Pt needs the forms for immunizations and tb   Call when ready  [phone]

## 2013-04-12 DIAGNOSIS — G4700 Insomnia, unspecified: Secondary | ICD-10-CM | POA: Insufficient documentation

## 2013-05-24 ENCOUNTER — Encounter: Payer: Self-pay | Admitting: Family Medicine

## 2013-05-24 ENCOUNTER — Ambulatory Visit (INDEPENDENT_AMBULATORY_CARE_PROVIDER_SITE_OTHER): Payer: BC Managed Care – PPO | Admitting: Family Medicine

## 2013-05-24 VITALS — BP 98/60 | HR 60 | Wt 108.0 lb

## 2013-05-24 DIAGNOSIS — R42 Dizziness and giddiness: Secondary | ICD-10-CM

## 2013-05-24 DIAGNOSIS — R51 Headache: Secondary | ICD-10-CM

## 2013-05-24 DIAGNOSIS — R519 Headache, unspecified: Secondary | ICD-10-CM

## 2013-05-24 NOTE — Progress Notes (Signed)
   Subjective:    Patient ID: Heather Davila, female    DOB: 09-Jul-1960, 53 y.o.   MRN: [id number]  HPI Patient seen with headaches an lightheadedness. Onset about 8 days ago. She also noticed some increased fatigue. Her headaches are fairly diffuse and dull and relatively mild and worse early morning and improved later in the day. She's had minimal if any sinus congestion. No cough. No fever. No nausea or vomiting. No visual difficulties. No focal weakness. No history of chronic headaches.  Her symptoms are actually improved today. She does not describe any orthostasis. Stays well-hydrated. No history of anemia.  Past Medical History  Diagnosis Date  . CANDIDIASIS, VAGINAL 01/08/2010  . DEPRESSION 08/09/2009  . TRANSAMINASES, SERUM, ELEVATED 08/14/2009  . Anorexia nervosa   . Anemia   . Insomnia    Past Surgical History  Procedure Laterality Date  . Cesarean section      X 3    reports that she quit smoking about 34 years ago. Her smoking use included Cigarettes. She has a 2.5 pack-year smoking history. She does not have any smokeless tobacco history on file. Her alcohol and drug histories are not on file. family history includes Heart disease in her father; Hypertension in her mother; Stroke in her father. Allergies  Allergen Reactions  . Ciprofloxacin     REACTION: hives  . Oxycodone-Acetaminophen     REACTION: hives       Review of Systems  Constitutional: Positive for fatigue. Negative for fever and chills.  Respiratory: Negative for cough and shortness of breath.   Cardiovascular: Negative for chest pain.  Genitourinary: Negative for dysuria.  Neurological: Positive for light-headedness and headaches. Negative for seizures and syncope.  Hematological: Negative for adenopathy.       Objective:   Physical Exam  Constitutional: She is oriented to person, place, and time. She appears well-developed and well-nourished.  HENT:  Mouth/Throat: Oropharynx is clear and  moist.  Neck: Neck supple. No thyromegaly present.  Cardiovascular: Normal rate and regular rhythm.  Exam reveals no gallop.   Pulmonary/Chest: Effort normal and breath sounds normal. No respiratory distress. She has no wheezes. She has no rales.  Musculoskeletal: She exhibits no edema.  Neurological: She is alert and oriented to person, place, and time. No cranial nerve deficit.          Assessment & Plan:  Patient presents with nonspecific headaches and lightheadedness. Question sinus related. Her symptoms are actually improved today. Nonfocal exam. No evidence for any active infection. Observe for now.

## 2013-05-24 NOTE — Progress Notes (Signed)
Pre visit review using our clinic review tool, if applicable. No additional management support is needed unless otherwise documented below in the visit note. 

## 2013-06-02 ENCOUNTER — Telehealth: Payer: Self-pay | Admitting: Family Medicine

## 2013-06-02 DIAGNOSIS — R42 Dizziness and giddiness: Secondary | ICD-10-CM

## 2013-06-02 NOTE — Telephone Encounter (Signed)
Can you please call patient and set up appt for lab to have a UA done please.

## 2013-06-02 NOTE — Telephone Encounter (Signed)
Yes.  Go ahead and order UA.

## 2013-06-02 NOTE — Telephone Encounter (Signed)
Pt states she was seen last Wednesday and she still is not feeling well, wants to see if she can get a urine specimen ordered to check for uti.

## 2013-06-02 NOTE — Telephone Encounter (Signed)
Last visit 05/24/13 for lightheadness

## 2013-06-06 ENCOUNTER — Encounter: Payer: Self-pay | Admitting: Nurse Practitioner

## 2013-06-07 ENCOUNTER — Ambulatory Visit: Payer: Self-pay | Admitting: Nurse Practitioner

## 2013-06-07 NOTE — Telephone Encounter (Signed)
Pt states she has an appt with her OBGYN and she will have it done there.

## 2013-06-09 ENCOUNTER — Ambulatory Visit: Payer: Self-pay | Admitting: Nurse Practitioner

## 2013-07-18 ENCOUNTER — Ambulatory Visit (INDEPENDENT_AMBULATORY_CARE_PROVIDER_SITE_OTHER): Payer: BC Managed Care – PPO | Admitting: Nurse Practitioner

## 2013-07-18 ENCOUNTER — Encounter: Payer: Self-pay | Admitting: Nurse Practitioner

## 2013-07-18 VITALS — BP 90/56 | HR 56 | Ht 60.0 in | Wt 103.0 lb

## 2013-07-18 DIAGNOSIS — Z01419 Encounter for gynecological examination (general) (routine) without abnormal findings: Secondary | ICD-10-CM

## 2013-07-18 DIAGNOSIS — Z0000 Encounter for general adult medical examination without abnormal findings: Secondary | ICD-10-CM

## 2013-07-18 DIAGNOSIS — E2839 Other primary ovarian failure: Secondary | ICD-10-CM

## 2013-07-18 LAB — POCT URINALYSIS DIPSTICK
BILIRUBIN UA: NEGATIVE
Blood, UA: NEGATIVE
GLUCOSE UA: NEGATIVE
Ketones, UA: NEGATIVE
LEUKOCYTES UA: NEGATIVE
NITRITE UA: NEGATIVE
PH UA: 8
Protein, UA: NEGATIVE
UROBILINOGEN UA: NEGATIVE

## 2013-07-18 NOTE — Progress Notes (Signed)
Patient ID: Heather Davila, female   DOB: 10-Oct-1960, 53 y.o.   MRN: [id number] 53 y.o. [id number] Married Caucasian Fe here for annual exam.  Not seeing alternative therapist  and not on any med's including progesterone.  No vaso symptoms. Some vaginal dryness and low libido.  Patient's last menstrual period was 12/27/2007.          Sexually active: yes  The current method of family planning is vasectomy and post menopausal status.    Exercising: no  The patient does not participate in regular exercise at present. Smoker:  no  Health Maintenance: Pap:  05/31/12, WNL, neg HR HPV MMG:  06/01/12, Bi-Rads 2: benign going to schedule Colonoscopy:  Tried X 2 and prep not good.  So put to sleep X 1 BMD:  none TDaP:  2013 Labs:  HB:  13.9 Urine:  Negative     reports that she quit smoking about 35 years ago. Her smoking use included Cigarettes. She has a 2.5 pack-year smoking history. She has never used smokeless tobacco. She reports that she does not drink alcohol or use illicit drugs.  Past Medical History  Diagnosis Date  . CANDIDIASIS, VAGINAL 01/08/2010  . DEPRESSION 08/09/2009  . TRANSAMINASES, SERUM, ELEVATED 08/14/2009  . Anorexia nervosa teen  . Anemia   . Insomnia     Past Surgical History  Procedure Laterality Date  . Cesarean section      X 3  . Dilitation & currettage/hystroscopy with hydrothermal ablation    . Breast enhancement surgery Bilateral   . Hysteroscopy  12/27/07    and HTA  . Endometrial ablation  12/27/07    Current Outpatient Prescriptions  Medication Sig Dispense Refill  . ABILIFY 5 MG tablet Take 1 tablet by mouth daily.      . clonazePAM (KLONOPIN) 0.5 MG tablet Take 1 tablet by mouth as needed.      . DULoxetine (CYMBALTA) 60 MG capsule Take 60 mg by mouth daily.      Marland Kitchen ibuprofen (ADVIL,MOTRIN) 800 MG tablet Take 1 tablet by mouth every 8 (eight) hours as needed.      . temazepam (RESTORIL) 30 MG capsule Take 30 mg by mouth at bedtime as needed.         No  current facility-administered medications for this visit.    Family History  Problem Relation Age of Onset  . Hypertension Mother   . Heart disease Father     [id number]  . Stroke Father   . Heart disease Paternal Grandfather     ROS:  Pertinent items are noted in HPI.  Otherwise, a comprehensive ROS was negative.  Exam:   BP 90/56  Pulse 56  Ht 5' (1.524 m)  Wt 103 lb (46.72 kg)  BMI 20.12 kg/m2  LMP 12/27/2007 Height: 5' (152.4 cm)  Ht Readings from Last 3 Encounters:  07/18/13 5' (1.524 m)  07/10/10 5' 0.5" (1.537 m)  02/23/10 5' (1.524 m)    General appearance: alert, cooperative and appears stated age Head: Normocephalic, without obvious abnormality, atraumatic Neck: no adenopathy, supple, symmetrical, trachea midline and thyroid normal to inspection and palpation Lungs: clear to auscultation bilaterally Breasts: normal appearance, no masses or tenderness, positive findings: implants bilaterally Heart: regular rate and rhythm Abdomen: soft, non-tender; no masses,  no organomegaly Extremities: extremities normal, atraumatic, no cyanosis or edema Skin: Skin color, texture, turgor normal. No rashes or lesions Lymph nodes: Cervical, supraclavicular, and axillary nodes normal. No abnormal inguinal nodes palpated Neurologic: Grossly normal  Pelvic: External genitalia:  no lesions              Urethra:  normal appearing urethra with no masses, tenderness or lesions              Bartholin's and Skene's: normal                 Vagina: normal appearing vagina with normal color and discharge, no lesions              Cervix: anteverted              Pap taken: no Bimanual Exam:  Uterus:  normal size, contour, position, consistency, mobility, non-tender              Adnexa: no mass, fullness, tenderness               Rectovaginal: Confirms               Anus:  normal sphincter tone, no lesions  A:  Well Woman with normal exam  Postmenopausal no longer on bio identical  hormones  Low libido    P:   Pap smear as per guidelines   Mammogram due now and is scheduled  Per request will check her estrogen levels. We discussed use of testosterone for libido and she was not interested.  Her previous estrogen level was quite high and FSH did show menopausal range.  Counseled on mammography screening, adequate intake of calcium and vitamin D, diet and exercise return annually or prn  An After Visit Summary was printed and given to the patient.

## 2013-07-18 NOTE — Patient Instructions (Signed)

## 2013-07-19 ENCOUNTER — Emergency Department (HOSPITAL_COMMUNITY)
Admission: EM | Admit: 2013-07-19 | Discharge: 2013-07-19 | Discharge status: Against Medical Advice | Payer: BC Managed Care – PPO | Attending: Emergency Medicine | Admitting: Emergency Medicine

## 2013-07-19 ENCOUNTER — Encounter (HOSPITAL_COMMUNITY): Payer: Self-pay | Admitting: Emergency Medicine

## 2013-07-19 DIAGNOSIS — R1013 Epigastric pain: Secondary | ICD-10-CM | POA: Insufficient documentation

## 2013-07-19 LAB — HEMOGLOBIN, FINGERSTICK: Hemoglobin, fingerstick: 13.9 g/dL (ref 12.0–16.0)

## 2013-07-19 LAB — ESTRADIOL: Estradiol: 38.2 pg/mL

## 2013-07-19 NOTE — ED Notes (Signed)
Pt states she is having sharp pain in her midepigastric area that started about 2100   Pt states she has been belching without relief  Denies N/V/D

## 2013-07-19 NOTE — Progress Notes (Signed)
Encouter reviewed by Dr. Josefa Half.

## 2013-07-19 NOTE — ED Notes (Signed)
Pt told registration clerk that she was feeling better and was going home

## 2013-08-05 ENCOUNTER — Encounter: Payer: Self-pay | Admitting: Certified Nurse Midwife

## 2013-08-05 ENCOUNTER — Ambulatory Visit (INDEPENDENT_AMBULATORY_CARE_PROVIDER_SITE_OTHER): Payer: BC Managed Care – PPO | Admitting: Certified Nurse Midwife

## 2013-08-05 ENCOUNTER — Ambulatory Visit: Payer: BC Managed Care – PPO | Admitting: Nurse Practitioner

## 2013-08-05 VITALS — BP 94/64 | HR 68 | Resp 16 | Ht 60.0 in | Wt 105.0 lb

## 2013-08-05 DIAGNOSIS — A64 Unspecified sexually transmitted disease: Secondary | ICD-10-CM

## 2013-08-05 DIAGNOSIS — N952 Postmenopausal atrophic vaginitis: Secondary | ICD-10-CM

## 2013-08-05 DIAGNOSIS — A6000 Herpesviral infection of urogenital system, unspecified: Secondary | ICD-10-CM

## 2013-08-05 DIAGNOSIS — A6009 Herpesviral infection of other urogenital tract: Secondary | ICD-10-CM

## 2013-08-05 HISTORY — DX: Unspecified sexually transmitted disease: A64

## 2013-08-05 MED ORDER — VALACYCLOVIR HCL 500 MG PO TABS: ORAL_TABLET | ORAL | Status: DC

## 2013-08-05 NOTE — Progress Notes (Signed)
53 y.o. Married Caucasian female [phone] here for complaint of blisters in vaginal area. Patient spouse of 26 years has history of HSV 2, which he treats with "cream". Patient has never had a outbreak in her life that she is aware history of or of HSV 1. Patient complainng of fatigue for the past few days and onset of blisters 2- 3 days ago and look just like what spouse has. Feels very irritated in vaginal area and history of vaginal dryness. No other issues. No new personal products.   O: Healthy WD,WN female Affect: normal, orientation x 3 Skin: warm and dry Abdomen:soft, non tender Inguinal lymph nodes, slightly tender not enlarged Pelvic exam:EXTERNAL GENITALIA: vulva slight red tender with herpetic appearing blisters noted, atrophic appearance   VAGINA: no abnormal discharge or lesions, and atrophic appearance CERVIX: no lesions or cervical motion tenderness and normal UTERUS: mid and normal, non tender ADNEXA: no masses palpable and nontender RECTUM: no herpetic blisters around rectal area or lesions  A:HSV ? 2 Spouse has HSV 2 for years  P: Discussed findings of herpes and etiology of transmission and reoccurrence. Questions addressed at length. Culture and serum screen offered declined, due spouse history and exam confirmation of appearance consistent with HSV. Discussed treatment with Valtrex, agreeable. Rx Valtrex see order Comfort measures with Aveeno sitz bath prn.  Discussed vaginal dryness will assess at recheck and discuss further.  RV  10 days, prn

## 2013-08-05 NOTE — Progress Notes (Signed)
Reviewed personally.  M. Suzanne Robbyn Hodkinson, MD.  

## 2013-08-05 NOTE — Patient Instructions (Signed)
Herpes Simplex Herpes simplex is generally classified as Type 1 or Type 2. Type 1 is generally the type that is responsible for cold sores. Type 2 is generally associated with sexually transmitted diseases. We now know that most of the thoughts on these viruses are inaccurate. We find that HSV1 is also present genitally and HSV2 can be present orally, but this will vary in different locations of the world. Herpes simplex is usually detected by doing a culture. Blood tests are also available for this virus; however, the accuracy is often not as good.  PREPARATION FOR TEST No preparation or fasting is necessary. NORMAL FINDINGS  No virus present  No HSV antigens or antibodies present Ranges for normal findings may vary among different laboratories and hospitals. You should always check with your doctor after having lab work or other tests done to discuss the meaning of your test results and whether your values are considered within normal limits. MEANING OF TEST  Your caregiver will go over the test results with you and discuss the importance and meaning of your results, as well as treatment options and the need for additional tests if necessary. OBTAINING THE TEST RESULTS  It is your responsibility to obtain your test results. Ask the lab or department performing the test when and how you will get your results. Document Released: 05/17/2004 Document Revised: 07/07/2011 Document Reviewed: 03/25/2008 [facility] Patient Information 2014 [place], [place].

## 2013-08-08 ENCOUNTER — Telehealth: Payer: Self-pay | Admitting: Gynecology

## 2013-08-08 NOTE — Telephone Encounter (Signed)
Spoke with patient. Patient states that when she was last seen by Regina Eck CNM on 4/10 it was decided to "hold off" on medication for a bacterial infection. Patient states that she is still having symptoms which include yellow discharge with odor, irritation, urinary frequency, and itching. Denies abdominal pain, bloating, and fevers. States she has appointment next week on 4/21 with Regina Eck CNM for a follow up. Would like to know if there is anything that she can have prescribed before coming in next week as she is uncomfortable. Has tried oatmeal sitz bath which she states "Does not seem to be working for me." Patient is also taking Valtrex and is requesting a cream to help to reduce her pain. Advised would check with Regina Eck CNM and would give her a call back. Patient agreeable and verbalizes understanding.   Preferred pharmacy is [facility].

## 2013-08-08 NOTE — Telephone Encounter (Signed)
Patient says Dr.Lathrop was waiting to call in a prescription for her bacterial infection. Patient has decided that she wants the prescription. Patient is also having pain and is asking for a topical prescription.

## 2013-08-09 ENCOUNTER — Other Ambulatory Visit: Payer: Self-pay | Admitting: Certified Nurse Midwife

## 2013-08-09 MED ORDER — ACYCLOVIR 5 % EX CREA: TOPICAL_CREAM | CUTANEOUS | Status: DC

## 2013-08-09 NOTE — Telephone Encounter (Signed)
Message left to return call to [place] at [phone], release of information advised can leave detailed message on cell phone. Advised a prescription was sent to [facility]. To return call with questions.   Routing to provider for final review. Patient agreeable to disposition. Will close encounter

## 2013-08-09 NOTE — Telephone Encounter (Signed)
Zovirax cream sent to pharmacy. Call patient let her know.

## 2013-08-09 NOTE — Telephone Encounter (Signed)
Patient calling again. Advised that I would send message to Debbi for her review and call back. Patient requesting a prescription cream.

## 2013-08-16 ENCOUNTER — Encounter: Payer: Self-pay | Admitting: Certified Nurse Midwife

## 2013-08-16 ENCOUNTER — Ambulatory Visit (INDEPENDENT_AMBULATORY_CARE_PROVIDER_SITE_OTHER): Payer: BC Managed Care – PPO | Admitting: Certified Nurse Midwife

## 2013-08-16 VITALS — BP 90/60 | HR 68 | Resp 16 | Ht 60.0 in | Wt 102.0 lb

## 2013-08-16 DIAGNOSIS — A6009 Herpesviral infection of other urogenital tract: Secondary | ICD-10-CM

## 2013-08-16 DIAGNOSIS — A6000 Herpesviral infection of urogenital system, unspecified: Secondary | ICD-10-CM

## 2013-08-16 MED ORDER — VALACYCLOVIR HCL 500 MG PO TABS: ORAL_TABLET | ORAL | Status: DC

## 2013-08-16 NOTE — Patient Instructions (Signed)
Herpes Simplex Herpes simplex is generally classified as Type 1 or Type 2. Type 1 is generally the type that is responsible for cold sores. Type 2 is generally associated with sexually transmitted diseases. We now know that most of the thoughts on these viruses are inaccurate. We find that HSV1 is also present genitally and HSV2 can be present orally, but this will vary in different locations of the world. Herpes simplex is usually detected by doing a culture. Blood tests are also available for this virus; however, the accuracy is often not as good.  PREPARATION FOR TEST No preparation or fasting is necessary. NORMAL FINDINGS  No virus present  No HSV antigens or antibodies present Ranges for normal findings may vary among different laboratories and hospitals. You should always check with your doctor after having lab work or other tests done to discuss the meaning of your test results and whether your values are considered within normal limits. MEANING OF TEST  Your caregiver will go over the test results with you and discuss the importance and meaning of your results, as well as treatment options and the need for additional tests if necessary. OBTAINING THE TEST RESULTS  It is your responsibility to obtain your test results. Ask the lab or department performing the test when and how you will get your results. Document Released: 05/17/2004 Document Revised: 07/07/2011 Document Reviewed: 03/25/2008 [employer] Patient Information 2014 [employer].  

## 2013-08-16 NOTE — Progress Notes (Signed)
53 y.o. Married Caucasian female [phone] here for follow up of Herpes treated with Valtrex and Acyclovir ointment initiated on 08/05/13. Patient has not noted any new blisters. Feeling much better, with less fatigue now. Starting to use Coconut oil for vaginal dryness now. Would like to do suppression therapy with Valtrex.Completed all medication as directed. No problems with medication.  O: Healthy WD,WN female Affect: normal, orientation x 3 Skin:warm and dry Inguinal lymph nodes non tender not enlarged Pelvic exam:EXTERNAL GENITALIA: normal appearing vulva with no masses, tenderness or lesions, only one herpetic blister noted, healing appearance VAGINA: no abnormal discharge or lesions and no blisters noted.  A:HSV ?2 resolving with Valtrex and Acyclovir ointment use. Patient desires suppression for HSV, due spouse history of.  Atrophic vaginitis using coconut oil with good response  P: Discussed findings of only one herpes blister which is healing. Questions addressed. Rx Valtrex see order. Advise if has any outbreaks in next 3 months.  RV prn

## 2013-08-24 NOTE — Progress Notes (Signed)
Reviewed personally.  M. Suzanne Terez Montee, MD.  

## 2013-09-15 ENCOUNTER — Encounter: Payer: Self-pay | Admitting: *Deleted

## 2013-09-16 ENCOUNTER — Ambulatory Visit (INDEPENDENT_AMBULATORY_CARE_PROVIDER_SITE_OTHER): Payer: BC Managed Care – PPO | Admitting: Neurology

## 2013-09-16 ENCOUNTER — Encounter: Payer: Self-pay | Admitting: Neurology

## 2013-09-16 ENCOUNTER — Encounter (INDEPENDENT_AMBULATORY_CARE_PROVIDER_SITE_OTHER): Payer: Self-pay

## 2013-09-16 VITALS — BP 95/63 | HR 54 | Resp 16 | Ht 60.5 in | Wt 104.0 lb

## 2013-09-16 DIAGNOSIS — F518 Other sleep disorders not due to a substance or known physiological condition: Secondary | ICD-10-CM

## 2013-09-16 DIAGNOSIS — IMO0002 Reserved for concepts with insufficient information to code with codable children: Secondary | ICD-10-CM

## 2013-09-16 DIAGNOSIS — R0609 Other forms of dyspnea: Secondary | ICD-10-CM

## 2013-09-16 DIAGNOSIS — R0989 Other specified symptoms and signs involving the circulatory and respiratory systems: Secondary | ICD-10-CM

## 2013-09-16 DIAGNOSIS — R0683 Snoring: Secondary | ICD-10-CM

## 2013-09-16 DIAGNOSIS — G4710 Hypersomnia, unspecified: Secondary | ICD-10-CM

## 2013-09-16 MED ORDER — ARMODAFINIL 150 MG PO TABS: 150.0000 mg | ORAL_TABLET | Freq: Every day | ORAL | Status: DC

## 2013-09-16 NOTE — Patient Instructions (Signed)

## 2013-09-16 NOTE — Progress Notes (Signed)
[facility]  Provider:  Larey Seat, M D  Referring Provider: Eulas Post, MD Primary Care Physician:  Eulas Post, MD  Chief Complaint  Patient presents with  . New Evaluation    Room 11  . Sleep consult    HPI:  Heather Davila is a 53 y.o., caucasian, right handed ,married  female, employed mother of 3.  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  She is  seen here as a referral from Dr. Sheralyn Boatman for evaluation of  Excessive hypersomnia.    [name] reports that she has become excessively daytime sleepy and fatigued sleep problems were first evident about 6 years ago when she had trouble falling asleep, She was not having sleep problems before that time. She begun taking sleep aids such as Ambien, Lunesta, trazodone but finds nor improvement. She was finally prescribed Restoril or temazepam and she was able to initiate sleep and sleeps all night with this medication. The dose was increased from 15 mg to 30 several years ago and she has stayed on the 30 mg pills.  However she's not feeling that it is  longer allowing her to fall asleep easier -or to stay asleep. This is in context to developing the hypersomnia and daytime as she is sleep deprived at night. The patient reports that she will take for sleep medicine at about 9 PM and usually is in Bed by 8 PM. She will fall asleep probably around 10 PM  but then wakes up after 3 hours perhaps 3-1/2 hours later. If she sleeps until 4 AM it "would be a rare, good night". Her husband of 25 years r eports her to snore, gasping, grunting for sleep. He travels for business a lot, and she mostly sleeps alone. Bed room is cool quiet and dark, but she runs a fan for noise.  She said that she feels as if she is ready to go for the day and once she gets up she is completely fatigued and exhausted;  a feeling that lasts the rest of the day. She feels no longer able to unwind if she goes  later to bed. Yet, she will accept a 2 hour period until she falls asleep. She reads in Bed, which used to relax her. No TV in bed.  No cell phone or computer in the bed room. She drinks no coffee after 8 AM because of insomnia. She wakes up spontaneously and never relies on an alarm. She has not been able to identify what is waking her.  She changed medications, tried to wean off Abilify and Cymbalta , but this has no effect on sleep.  She recalls screaming perfectly achieved only dreams after she falls asleep. During the daytime when she works she cannot now, also she may crave a nap. She has never been a person taking naps in her life she states.  Last week end her husband asked her to lay down since she was exhausted , and she fell asleep. She will fall easily asleep as a passenger ( not  as a driver).  She had an episode of restless legs related to low iron levels which were treated. Restless legs do not currently appear to be a problem.   She denies sleep paralysis or dream intrusion, no cataplexy.   Her medications are listed separately - she's currently only on temazepam and Klonopin and Brentilex .  She has no history of facial or airway surgeries,  she is the main caretaker of  her medically affected son, 65 years old. He has vascular lymphatic malformations with pain and blood clots. .  She has 3 sons , 50, 79.and 17.  She has been evaluated many years ago for Vit D  Dec deficiency , takes supplement per prescription, TSH yearly checked as normal. No anemia.      She endorses fatigue severity score at that 57 points and the Epworth sleepiness score at 17 points.   Review of Systems: Out of a complete 14 system review, the patient complains of only the following symptoms, and all other reviewed systems are negative. She endorses fatigue severity score at that 57 points and the Epworth sleepiness score at 17 points.  History   Social History  . Marital Status: Married    Spouse  Name: [name]    Number of Children: 3  . Years of Education: Master's   Occupational History  .     Social History Main Topics  . Smoking status: Former Smoker -- 0.50 packs/day for 5 years    Types: Cigarettes    Quit date: 07/10/1987  . Smokeless tobacco: Never Used  . Alcohol Use: No  . Drug Use: No  . Sexual Activity: Yes    Partners: Male    Birth Control/ Protection: Post-menopausal, Surgical     Comment: vasectomy   Other Topics Concern  . Not on file   Social History Narrative   Patient is married [name]) and lives at home with her husband and two children.   Patient has three children.   Patient works at [employer].   Patient has a Master's degree.   Patient is left-handed.   Patient drinks 6 cups of coffee daily.    Family History  Problem Relation Age of Onset  . Hypertension Mother   . Heart disease Father     [id number]  . Stroke Father   . Heart disease Paternal Grandfather     Past Medical History  Diagnosis Date  . CANDIDIASIS, VAGINAL 01/08/2010  . DEPRESSION 08/09/2009  . TRANSAMINASES, SERUM, ELEVATED 08/14/2009  . Anorexia nervosa teen  . Anemia   . Insomnia   . STD (sexually transmitted disease)     HSV2?    Past Surgical History  Procedure Laterality Date  . Cesarean section      X 3  . Dilitation & currettage/hystroscopy with hydrothermal ablation    . Breast enhancement surgery Bilateral   . Hysteroscopy  12/27/07    and HTA  . Endometrial ablation  12/27/07    Current Outpatient Prescriptions  Medication Sig Dispense Refill  . ABILIFY 5 MG tablet Take 2.5 mg by mouth daily.       . clonazePAM (KLONOPIN) 0.5 MG tablet Take 1 tablet by mouth 2 (two) times daily as needed for anxiety.       . temazepam (RESTORIL) 30 MG capsule Take 30 mg by mouth at bedtime as needed for sleep.       . Vortioxetine HBr (BRINTELLIX) 10 MG TABS Take 1 tablet by mouth daily.       No current facility-administered medications for this visit.     Allergies as of 09/16/2013 - Review Complete 09/16/2013  Allergen Reaction Noted  . Ciprofloxacin  08/09/2009  . Oxycodone-acetaminophen  08/09/2009    Vitals: BP 95/63  Pulse 54  Resp 16  Ht 5' 0.5" (1.537 m)  Wt 104 lb (47.174 kg)  BMI 19.97 kg/m2  LMP 12/27/2007  Last Weight:  Wt Readings from Last 1 Encounters:  09/16/13 104 lb (47.174 kg)   Last Height:   Ht Readings from Last 1 Encounters:  09/16/13 5' 0.5" (1.537 m)    Physical exam:  General: The patient is awake, alert and appears not in acute distress. The patient is well groomed. Head: Normocephalic, atraumatic. Neck is supple. Mallampati  14 , neck circumference:12 inches! , no TMJ , unrestrcited  Air flow through her nose. Retrognathia , overbite , used to have braces.  Cardiovascular:  Regular rate and rhythm, without  murmurs or carotid bruit, and without distended neck veins. Respiratory: Lungs are clear to auscultation. Skin:  Without evidence of edema, or rash Trunk: BMI is  elevated and patient  has normal posture.  Neurologic exam : The patient is awake and alert, oriented to place and time.  Memory subjective  described as intact.  There is a normal attention span & concentration ability. Speech is fluent without  dysarthria, dysphonia or aphasia. Mood and affect are appropriate.  Cranial nerves: Pupils are equal and briskly reactive to light. Funduscopic exam without  evidence of pallor or edema.  Extraocular movements  in vertical and horizontal planes intact and without nystagmus. Visual fields by finger perimetry are intact. Hearing to finger rub intact.  Facial sensation intact to fine touch. Facial motor strength is symmetric and tongue and uvula move midline.  Motor exam: Normal tone and normal muscle bulk and symmetric normal strength in all extremities.  Sensory:  Fine touch, pinprick and vibration were tested in all extremities. Proprioception normal.  Coordination: Rapid alternating  movements in the fingers/hands is tested and normal. Finger-to-nose maneuver tested and normal without evidence of ataxia, dysmetria or tremor.  Gait and station: Patient walks without assistive device , climbs up to the exam table. Strength within normal limits. Stance is stable and normal. Steps are unfragmented. Romberg testing is normal.  Deep tendon reflexes: in the  upper and lower extremities are symmetric and intact. Babinski maneuver response is downgoing.   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   Plan:  Treatment plan and additional workup :  Try the CALM application on your i phone or I pad. Cover the light emitting screen. Sleep hygiene information given .   Use Belsomra as  A non addictive sleep aid. Klonopin not longer effective, Temazepam not effective.  Seroquel was tried, but she felt depressed.  SPLIT study ordered, will be done with parasomnia montage.

## 2013-09-21 ENCOUNTER — Telehealth: Payer: Self-pay | Admitting: Neurology

## 2013-09-21 NOTE — Telephone Encounter (Signed)
All info has been submitted to ins.  Pending response.

## 2013-09-21 NOTE — Telephone Encounter (Signed)
Mae with [employer] is calling for pre authorization for Nuvigil 150mg  for patient--thank you.

## 2013-09-22 ENCOUNTER — Other Ambulatory Visit: Payer: Self-pay | Admitting: Neurology

## 2013-09-22 DIAGNOSIS — R0683 Snoring: Secondary | ICD-10-CM

## 2013-09-22 DIAGNOSIS — G4730 Sleep apnea, unspecified: Secondary | ICD-10-CM

## 2013-09-23 ENCOUNTER — Telehealth: Payer: Self-pay | Admitting: *Deleted

## 2013-09-23 ENCOUNTER — Telehealth: Payer: Self-pay

## 2013-09-23 NOTE — Telephone Encounter (Signed)
Message copied by Cathi Roan on Fri Sep 23, 2013  9:24 AM ------      Message from: Dory Horn      Created: Wed Sep 21, 2013 12:46 PM      Regarding: Need HST       This patient was denied an attended sleep study.  Please add order for HST.            Pt will be covered at 100% - copay does not apply.   ------

## 2013-09-23 NOTE — Telephone Encounter (Signed)
The prior auth for Heather Davila has been approved.  Please see next note.

## 2013-09-23 NOTE — Telephone Encounter (Signed)
Called to arrange home sleep test appointment and patient stated she really did not want to pursue this testing because she felt very strongly that she did not have sleep apnea.  She feels like it is an unnecessary test and expense.  Her primary diagnosis is insomnia and her hypersomnia she feels results from that. She doesn't snore, she has no witnessed apneas, she has a low BMI, a very small neck.  She simply has trouble initiating and maintaining sleep and this results in feeling sleepy during the daytime. She would like to hold off on home sleep testing for sleep apnea at this time.  I reviewed her chart and she doesn't meet the "high pre-test probability for sleep apnea" requirements for home sleep testing.  I expressed to her that if she desires, she can always take a little time and if she feels like she would like to pursue testing, her order will be in the system and we can revisit this.  She understands.  She mentions that her insurance called her and stated she needs to get more info from the physician in order to approve the request for Nuvigil.  I will forward this to [name] in order to expedite authorization for this prescription.

## 2013-09-23 NOTE — Telephone Encounter (Signed)
[employer] sent [place] a letter saying they have approved our request for coverage on Nuvigil effective until 09/23/2014 Ref # [id number].  They indicate they have notified the patient as well.

## 2013-10-18 ENCOUNTER — Ambulatory Visit: Payer: Self-pay | Admitting: Nurse Practitioner

## 2014-02-06 ENCOUNTER — Encounter (HOSPITAL_COMMUNITY): Payer: Self-pay | Admitting: Emergency Medicine

## 2014-02-06 ENCOUNTER — Emergency Department (HOSPITAL_COMMUNITY)
Admission: EM | Admit: 2014-02-06 | Discharge: 2014-02-06 | Disposition: A | Discharge status: Home / Self Care | Payer: BC Managed Care – PPO | Attending: Emergency Medicine | Admitting: Emergency Medicine

## 2014-02-06 DIAGNOSIS — Z8619 Personal history of other infectious and parasitic diseases: Secondary | ICD-10-CM | POA: Diagnosis not present

## 2014-02-06 DIAGNOSIS — Z862 Personal history of diseases of the blood and blood-forming organs and certain disorders involving the immune mechanism: Secondary | ICD-10-CM | POA: Insufficient documentation

## 2014-02-06 DIAGNOSIS — F329 Major depressive disorder, single episode, unspecified: Secondary | ICD-10-CM | POA: Diagnosis not present

## 2014-02-06 DIAGNOSIS — R300 Dysuria: Secondary | ICD-10-CM | POA: Diagnosis present

## 2014-02-06 DIAGNOSIS — Z87891 Personal history of nicotine dependence: Secondary | ICD-10-CM | POA: Insufficient documentation

## 2014-02-06 DIAGNOSIS — N390 Urinary tract infection, site not specified: Secondary | ICD-10-CM | POA: Diagnosis not present

## 2014-02-06 DIAGNOSIS — Z79899 Other long term (current) drug therapy: Secondary | ICD-10-CM | POA: Diagnosis not present

## 2014-02-06 LAB — URINE MICROSCOPIC-ADD ON

## 2014-02-06 LAB — URINALYSIS, ROUTINE W REFLEX MICROSCOPIC
BILIRUBIN URINE: NEGATIVE
GLUCOSE, UA: NEGATIVE mg/dL
KETONES UR: NEGATIVE mg/dL
Nitrite: NEGATIVE
PH: 7 (ref 5.0–8.0)
PROTEIN: NEGATIVE mg/dL
Specific Gravity, Urine: 1.007 (ref 1.005–1.030)
Urobilinogen, UA: 0.2 mg/dL (ref 0.0–1.0)

## 2014-02-06 MED ORDER — CEPHALEXIN 250 MG PO CAPS
500.0000 mg | ORAL_CAPSULE | Freq: Once | ORAL | Status: AC
  Administered 2014-02-06: 500 mg via ORAL
  Filled 2014-02-06: qty 2

## 2014-02-06 MED ORDER — KETOROLAC TROMETHAMINE 60 MG/2ML IM SOLN
60.0000 mg | Freq: Once | INTRAMUSCULAR | Status: AC
  Administered 2014-02-06: 60 mg via INTRAMUSCULAR
  Filled 2014-02-06: qty 2

## 2014-02-06 MED ORDER — PHENAZOPYRIDINE HCL 100 MG PO TABS: 100.0000 mg | ORAL_TABLET | Freq: Three times a day (TID) | ORAL | Status: DC | PRN

## 2014-02-06 MED ORDER — CEPHALEXIN 500 MG PO CAPS: 500.0000 mg | ORAL_CAPSULE | Freq: Two times a day (BID) | ORAL | Status: DC

## 2014-02-06 MED ORDER — PHENAZOPYRIDINE HCL 100 MG PO TABS
100.0000 mg | ORAL_TABLET | Freq: Once | ORAL | Status: AC
  Administered 2014-02-06: 100 mg via ORAL
  Filled 2014-02-06: qty 1

## 2014-02-06 NOTE — ED Provider Notes (Signed)
CSN: [id number]     Arrival date & time 02/06/14  0111 History   First MD Initiated Contact with Patient 02/06/14 0231     Chief Complaint  Patient presents with  . Urinary Tract Infection     (Consider location/radiation/quality/duration/timing/severity/associated sxs/prior Treatment) HPI  This is a 53 year old female who presents with dysuria. Patient reports 4-5 hours of worsening dysuria and frequency. Denies any urgency. Reports suprapubic pressure without pain. History of UTI which felt similar in the past. Denies any fevers or back pain.  Past Medical History  Diagnosis Date  . CANDIDIASIS, VAGINAL 01/08/2010  . DEPRESSION 08/09/2009  . TRANSAMINASES, SERUM, ELEVATED 08/14/2009  . Anorexia nervosa teen  . Anemia   . Insomnia   . STD (sexually transmitted disease)     HSV2?   Past Surgical History  Procedure Laterality Date  . Cesarean section      X 3  . Dilitation & currettage/hystroscopy with hydrothermal ablation    . Breast enhancement surgery Bilateral   . Hysteroscopy  12/27/07    and HTA  . Endometrial ablation  12/27/07   Family History  Problem Relation Age of Onset  . Hypertension Mother   . Heart disease Father     [id number]  . Stroke Father   . Heart disease Paternal Grandfather    History  Substance Use Topics  . Smoking status: Former Smoker -- 0.50 packs/day for 5 years    Types: Cigarettes    Quit date: 07/10/1987  . Smokeless tobacco: Never Used  . Alcohol Use: No   OB History   Grav Para Term Preterm Abortions TAB SAB Ect Mult Living   3 3 3       3      Review of Systems  Constitutional: Negative for fever.  Genitourinary: Positive for dysuria and frequency. Negative for hematuria and flank pain.  All other systems reviewed and are negative.     Allergies  Ciprofloxacin and Oxycodone-acetaminophen  Home Medications   Prior to Admission medications   Medication Sig Start Date End Date Taking? Authorizing Provider  Vortioxetine  HBr (BRINTELLIX) 10 MG TABS Take 1 tablet by mouth daily.   Yes Historical Provider, MD  cephALEXin (KEFLEX) 500 MG capsule Take 1 capsule (500 mg total) by mouth 2 (two) times daily. 02/06/14   Merryl Hacker, MD  phenazopyridine (PYRIDIUM) 100 MG tablet Take 1 tablet (100 mg total) by mouth 3 (three) times daily as needed for pain. 02/06/14   Merryl Hacker, MD   BP 90/57  Pulse 58  Temp(Src) 98 F (36.7 C) (Oral)  Resp 16  Ht 5' (1.524 m)  Wt 110 lb (49.896 kg)  BMI 21.48 kg/m2  SpO2 100%  LMP 12/27/2007 Physical Exam  Nursing note and vitals reviewed. Constitutional: She is oriented to person, place, and time. She appears well-developed and well-nourished. No distress.  HENT:  Head: Normocephalic and atraumatic.  Cardiovascular: Normal rate, regular rhythm and normal heart sounds.   No murmur heard. Pulmonary/Chest: Effort normal and breath sounds normal. No respiratory distress. She has no wheezes.  Abdominal: Soft. Bowel sounds are normal. There is no tenderness. There is no rebound and no guarding.  Genitourinary:  No CVA tenderness  Musculoskeletal: She exhibits no edema.  Neurological: She is alert and oriented to person, place, and time.  Skin: Skin is warm and dry.  Psychiatric: She has a normal mood and affect.    ED Course  Procedures (including critical care time) Labs Review  Labs Reviewed  URINALYSIS, ROUTINE W REFLEX MICROSCOPIC - Abnormal; Notable for the following:    APPearance CLOUDY (*)    Hgb urine dipstick LARGE (*)    Leukocytes, UA LARGE (*)    All other components within normal limits  URINE MICROSCOPIC-ADD ON - Abnormal; Notable for the following:    Squamous Epithelial / LPF FEW (*)    Bacteria, UA MANY (*)    All other components within normal limits  URINE CULTURE    Imaging Review No results found.   EKG Interpretation None      MDM   Final diagnoses:  UTI (lower urinary tract infection)    Patient presents with dysuria  and frequency. History of UTI. Denies systemic symptoms. Vital signs reassuring. Urinalysis with too numerous to count white cells and many bacteria. Culture sent. Patient given Keflex and pyridium. Will discharge with the same.  After history, exam, and medical workup I feel the patient has been appropriately medically screened and is safe for discharge home. Pertinent diagnoses were discussed with the patient. Patient was given return precautions.     Merryl Hacker, MD 02/06/14 [phone]

## 2014-02-06 NOTE — ED Notes (Signed)
MD at bedside. 

## 2014-02-06 NOTE — Discharge Instructions (Signed)

## 2014-02-06 NOTE — ED Notes (Signed)
Patient presents stating that a couple of hours ago she started having painful urination and urgency

## 2014-02-08 ENCOUNTER — Telehealth: Payer: Self-pay | Admitting: Certified Nurse Midwife

## 2014-02-08 ENCOUNTER — Institutional Professional Consult (permissible substitution): Payer: BC Managed Care – PPO | Admitting: Certified Nurse Midwife

## 2014-02-08 LAB — URINE CULTURE

## 2014-02-08 NOTE — Telephone Encounter (Signed)
Patient called and cancelled her consult appointment with Johny Shock, CNM today due her "son is in the hospital." I did not charge the patient a fee for the missed appointment. She rescheduled for 02/13/14.

## 2014-02-09 ENCOUNTER — Telehealth (HOSPITAL_COMMUNITY): Payer: Self-pay

## 2014-02-09 NOTE — ED Notes (Signed)
Post ED Visit - Positive Culture Follow-up  Culture report reviewed by antimicrobial stewardship pharmacist: []  Wes Dulaney, Pharm.D., BCPS [x]  Heide Guile, Pharm.D., BCPS []  Alycia Rossetti, Pharm.D., BCPS []  [place], Pharm.D., BCPS, AAHIVP []  Legrand Como, Pharm.D., BCPS, AAHIVP []  Carly Sabat, Pharm.D. []  Elenor Quinones, Pharm.D.  Positive urine culture Treated with cephalexin, organism sensitive to the same and no further patient follow-up is required at this time.  Ileene Musa 02/09/2014, 9:57 AM

## 2014-02-13 ENCOUNTER — Encounter: Payer: Self-pay | Admitting: Certified Nurse Midwife

## 2014-02-13 ENCOUNTER — Ambulatory Visit (INDEPENDENT_AMBULATORY_CARE_PROVIDER_SITE_OTHER): Payer: BC Managed Care – PPO | Admitting: Certified Nurse Midwife

## 2014-02-13 VITALS — BP 98/62 | HR 72 | Resp 16 | Ht 60.0 in | Wt 107.0 lb

## 2014-02-13 DIAGNOSIS — N952 Postmenopausal atrophic vaginitis: Secondary | ICD-10-CM

## 2014-02-13 MED ORDER — ESTRADIOL 10 MCG VA TABS: ORAL_TABLET | VAGINAL | Status: DC

## 2014-02-13 NOTE — Patient Instructions (Signed)

## 2014-02-13 NOTE — Progress Notes (Signed)
53 y.o. married white menopausal female [id number] here with complaint of vaginal symptoms dryness and pain with intercourse. Patient is not on HRT or desire use. Patient denies itching or burning. Recent UTI, seen in ER and treated.Patient has been using coconut oil, but is not working as well now. "Help". All UTI symptoms have resolved. No history of breast cancer, DVT or cardiovascular issues. Pap smear in 2/14 normal, with neg. HPVHR  O:Healthy female WDWN Affect: normal, orientation x 3  Exam: Lymph node: no enlargement or tenderness Pelvic exam: External genital: atrophic appearance BUS: negative Vagina: scant discharge noted. Vagina is thin and pale in appearance and slightly tender on exam. No odor Cervix: normal, non tender Uterus: normal, non tender Adnexa:normal, non tender, no masses or fullness noted    A:Atrophic vaginitis, menopausal Normal pelvic exam  P:Discussed findings of atrophic vaginitis and etiology. Discussed risks and benefits and expectation of estrogen use for atrophic vaginitis. Questions addressed. Patient would like to try. Continue Coconut Oil for sexual activity, but would wait until at least 2 weeks of Vagifem use first. Rx Vagifem see order with instructions of every hs for 14 days and then twice weekly vaginally.  Rv one month

## 2014-02-14 MED ORDER — ESTRADIOL 10 MCG VA TABS: 1.0000 | ORAL_TABLET | VAGINAL | Status: DC

## 2014-02-14 NOTE — Telephone Encounter (Signed)
Spoke with patient. Patient states that when she went to pick up Estradiol from pharmacy she was given one month supply but "My [facility]. So I was hoping you could switch the rx over." Rx for Estradiol 10 mcg #16 0RF sent to [facility]. Patient has one month follow up scheduled with Regina Eck CNM on 11/19 at 4pm. Patient is agreeable and verbalizes understanding.  Routing to provider for final review. Patient agreeable to disposition. Will close encounter

## 2014-02-14 NOTE — Telephone Encounter (Signed)
Pt would like to speak with nurse about a prescription.

## 2014-02-17 NOTE — Progress Notes (Signed)
Reviewed personally.  M. Suzanne Kyriana Yankee, MD.  

## 2014-02-27 ENCOUNTER — Encounter: Payer: Self-pay | Admitting: Certified Nurse Midwife

## 2014-03-15 ENCOUNTER — Ambulatory Visit: Payer: BC Managed Care – PPO | Admitting: Certified Nurse Midwife

## 2014-03-16 ENCOUNTER — Ambulatory Visit: Payer: BC Managed Care – PPO | Admitting: Certified Nurse Midwife

## 2014-03-20 ENCOUNTER — Encounter: Payer: Self-pay | Admitting: Certified Nurse Midwife

## 2014-03-20 ENCOUNTER — Ambulatory Visit (INDEPENDENT_AMBULATORY_CARE_PROVIDER_SITE_OTHER): Payer: BC Managed Care – PPO | Admitting: Certified Nurse Midwife

## 2014-03-20 VITALS — BP 120/74 | HR 72 | Ht 60.0 in | Wt 104.0 lb

## 2014-03-20 DIAGNOSIS — N952 Postmenopausal atrophic vaginitis: Secondary | ICD-10-CM

## 2014-03-20 MED ORDER — ESTRADIOL 10 MCG VA TABS: 1.0000 | ORAL_TABLET | VAGINAL | Status: DC

## 2014-03-20 NOTE — Progress Notes (Signed)
53 y.o. Married Caucasian female [phone] here for follow up of Atrophic Vaginits treated with Vagifem initiated on 02/13/14. Using medication as prescribed. Patient feels she may have improved. Has not tried sexual activity yet. Denies any vaginal bleeding.  O: Healthy WD,WN female Affect: Orientation  X 3 Pelvic exam:EXTERNAL GENITALIA: normal appearing vulva with no masses, tenderness or lesions VAGINA: no abnormal discharge or lesions and looks very moist, no dryness noted no pain with exam!!! CERVIX: no lesions or cervical motion tenderness UTERUS: gravid and anteverted ADNEXA: no masses palpable and nontender  A:Atrophic Vaginitis Vagifem working well  P: Discussed findings of improved appearance and no pain. Patient feels that things have definitely changed. Discussed sexual activity for the first time, using coconut oil vaginally and on spouse. Patient aware and will advise if concerns. Rx Vagifem see order  RV prn

## 2014-03-22 NOTE — Progress Notes (Signed)
Reviewed personally.  M. Suzanne Minetta Krisher, MD.  

## 2014-04-25 ENCOUNTER — Other Ambulatory Visit: Payer: BC Managed Care – PPO

## 2014-05-01 ENCOUNTER — Encounter (HOSPITAL_COMMUNITY): Payer: Self-pay | Admitting: *Deleted

## 2014-05-01 ENCOUNTER — Telehealth: Payer: Self-pay | Admitting: Family Medicine

## 2014-05-01 ENCOUNTER — Emergency Department (HOSPITAL_COMMUNITY): Payer: BLUE CROSS/BLUE SHIELD

## 2014-05-01 ENCOUNTER — Emergency Department (HOSPITAL_COMMUNITY)
Admission: EM | Admit: 2014-05-01 | Discharge: 2014-05-01 | Disposition: A | Discharge status: Home / Self Care | Payer: BLUE CROSS/BLUE SHIELD | Attending: Emergency Medicine | Admitting: Emergency Medicine

## 2014-05-01 ENCOUNTER — Encounter: Payer: BC Managed Care – PPO | Admitting: Family Medicine

## 2014-05-01 DIAGNOSIS — Z87891 Personal history of nicotine dependence: Secondary | ICD-10-CM | POA: Diagnosis not present

## 2014-05-01 DIAGNOSIS — R110 Nausea: Secondary | ICD-10-CM

## 2014-05-01 DIAGNOSIS — Z8619 Personal history of other infectious and parasitic diseases: Secondary | ICD-10-CM | POA: Diagnosis not present

## 2014-05-01 DIAGNOSIS — Z79899 Other long term (current) drug therapy: Secondary | ICD-10-CM | POA: Diagnosis not present

## 2014-05-01 DIAGNOSIS — R42 Dizziness and giddiness: Secondary | ICD-10-CM | POA: Diagnosis not present

## 2014-05-01 DIAGNOSIS — Z862 Personal history of diseases of the blood and blood-forming organs and certain disorders involving the immune mechanism: Secondary | ICD-10-CM | POA: Insufficient documentation

## 2014-05-01 DIAGNOSIS — G4700 Insomnia, unspecified: Secondary | ICD-10-CM | POA: Diagnosis not present

## 2014-05-01 LAB — DIFFERENTIAL
Basophils Absolute: 0 10*3/uL (ref 0.0–0.1)
Basophils Relative: 1 % (ref 0–1)
Eosinophils Absolute: 0.1 10*3/uL (ref 0.0–0.7)
Eosinophils Relative: 2 % (ref 0–5)
LYMPHS PCT: 23 % (ref 12–46)
Lymphs Abs: 1.1 10*3/uL (ref 0.7–4.0)
Monocytes Absolute: 0.4 10*3/uL (ref 0.1–1.0)
Monocytes Relative: 9 % (ref 3–12)
NEUTROS ABS: 3.1 10*3/uL (ref 1.7–7.7)
NEUTROS PCT: 65 % (ref 43–77)

## 2014-05-01 LAB — URINALYSIS, ROUTINE W REFLEX MICROSCOPIC
Bilirubin Urine: NEGATIVE
Glucose, UA: NEGATIVE mg/dL
Hgb urine dipstick: NEGATIVE
Ketones, ur: NEGATIVE mg/dL
Leukocytes, UA: NEGATIVE
Nitrite: NEGATIVE
Protein, ur: NEGATIVE mg/dL
Specific Gravity, Urine: 1.015 (ref 1.005–1.030)
Urobilinogen, UA: 0.2 mg/dL (ref 0.0–1.0)
pH: 7.5 (ref 5.0–8.0)

## 2014-05-01 LAB — RAPID URINE DRUG SCREEN, HOSP PERFORMED
Amphetamines: NOT DETECTED
Barbiturates: NOT DETECTED
Benzodiazepines: POSITIVE — AB
Cocaine: NOT DETECTED
Opiates: NOT DETECTED
Tetrahydrocannabinol: NOT DETECTED

## 2014-05-01 LAB — CBC
HEMATOCRIT: 38.6 % (ref 36.0–46.0)
HEMOGLOBIN: 13.3 g/dL (ref 12.0–15.0)
MCH: 32.6 pg (ref 26.0–34.0)
MCHC: 34.5 g/dL (ref 30.0–36.0)
MCV: 94.6 fL (ref 78.0–100.0)
Platelets: 210 10*3/uL (ref 150–400)
RBC: 4.08 MIL/uL (ref 3.87–5.11)
RDW: 11.9 % (ref 11.5–15.5)
WBC: 4.8 10*3/uL (ref 4.0–10.5)

## 2014-05-01 LAB — COMPREHENSIVE METABOLIC PANEL
ALT: 100 U/L — AB (ref 0–35)
AST: 68 U/L — ABNORMAL HIGH (ref 0–37)
Albumin: 4.3 g/dL (ref 3.5–5.2)
Alkaline Phosphatase: 76 U/L (ref 39–117)
Anion gap: 9 (ref 5–15)
BILIRUBIN TOTAL: 0.6 mg/dL (ref 0.3–1.2)
BUN: 8 mg/dL (ref 6–23)
CHLORIDE: 93 meq/L — AB (ref 96–112)
CO2: 30 mmol/L (ref 19–32)
CREATININE: 0.58 mg/dL (ref 0.50–1.10)
Calcium: 9.7 mg/dL (ref 8.4–10.5)
GLUCOSE: 107 mg/dL — AB (ref 70–99)
Potassium: 3.4 mmol/L — ABNORMAL LOW (ref 3.5–5.1)
Sodium: 132 mmol/L — ABNORMAL LOW (ref 135–145)
Total Protein: 7 g/dL (ref 6.0–8.3)

## 2014-05-01 LAB — APTT: aPTT: 33 seconds (ref 24–37)

## 2014-05-01 LAB — PROTIME-INR
INR: 1.04 (ref 0.00–1.49)
Prothrombin Time: 13.7 seconds (ref 11.6–15.2)

## 2014-05-01 LAB — TROPONIN I: Troponin I: <0.03 ng/mL (ref <0.031)

## 2014-05-01 LAB — ETHANOL: Alcohol, Ethyl (B): <5 mg/dL (ref 0–9)

## 2014-05-01 MED ORDER — SODIUM CHLORIDE 0.9 % IV BOLUS (SEPSIS)
1000.0000 mL | Freq: Once | INTRAVENOUS | Status: AC
  Administered 2014-05-01: 1000 mL via INTRAVENOUS

## 2014-05-01 MED ORDER — ONDANSETRON 4 MG PO TBDP: 4.0000 mg | ORAL_TABLET | Freq: Three times a day (TID) | ORAL | Status: DC | PRN

## 2014-05-01 MED ORDER — MECLIZINE HCL 25 MG PO TABS: 25.0000 mg | ORAL_TABLET | Freq: Three times a day (TID) | ORAL | Status: DC | PRN

## 2014-05-01 MED ORDER — ONDANSETRON HCL 4 MG/2ML IJ SOLN
4.0000 mg | Freq: Once | INTRAMUSCULAR | Status: AC
  Administered 2014-05-01: 4 mg via INTRAVENOUS
  Filled 2014-05-01: qty 2

## 2014-05-01 MED ORDER — MECLIZINE HCL 25 MG PO TABS
25.0000 mg | ORAL_TABLET | Freq: Once | ORAL | Status: AC
  Administered 2014-05-01: 25 mg via ORAL
  Filled 2014-05-01: qty 1

## 2014-05-01 NOTE — ED Notes (Signed)
Spoke with lisa, pa, in regards to patient's blood pressure, 86/65. She acknowledges, gives verbal order for 1 NS bolus.

## 2014-05-01 NOTE — ED Provider Notes (Signed)
MSE was initiated and I personally evaluated the patient and placed orders (if any) at  5:00 PM on May 01, 2014.  The patient appears stable so that the remainder of the MSE may be completed by another provider.   Pt reported onset of dizziness/near syncope at 1400 She is now  improving No arm/leg drift.  No facial droop Orders placed At this point, with symptoms improving and also reported near syncope, not code stroke at this time   Sharyon Cable, MD 05/01/14 1700

## 2014-05-01 NOTE — Telephone Encounter (Signed)
Can you please schedule patient for appt. Last visit 05/24/13

## 2014-05-01 NOTE — ED Notes (Signed)
Had Dairl Ponder, PA to eval pt at triage. pt reports dizziness increases when turning her head from side to side.

## 2014-05-01 NOTE — ED Notes (Addendum)
Pt reports feeling fine and then onset of dizziness while driving at approx [phone], pt also has nausea and "feels like she is leaning towards the right." right arm strength weaker, no facial droop noted, speech is clear, pt is leaning more towards right side in wheelchair.  ekg done airway intact at triage.

## 2014-05-01 NOTE — ED Notes (Signed)
Patient ambulated in hall to restroom, no distress. States she is feeling much better.

## 2014-05-01 NOTE — Discharge Instructions (Signed)
Take the prescribed medication as directed. °Follow-up with your primary care physician. °Return to the ED for new or worsening symptoms. ° °

## 2014-05-01 NOTE — ED Provider Notes (Signed)
CSN: [id number]     Arrival date & time 05/01/14  1625 History   First MD Initiated Contact with Patient 05/01/14 1720     Chief Complaint  Patient presents with  . Dizziness     (Consider location/radiation/quality/duration/timing/severity/associated sxs/prior Treatment) The history is provided by the patient and medical records.    This is a 54 y.o. F with PMH significant for anemia, insomnia, presenting to the ED for dizziness.  Patient states earlier this afternoon while driving around 0160 she experienced sudden onset of dizziness and nausea. States she pulled over and had her son pick her up. No syncope or LOC.  She states since her son picked her up her dizziness has improved, but has not entirely resolved. Dizziness and nausea both seem worse with movement, better when sitting still.  She denies any tinnitus, confusion, changes in speech, numbness or weakness of extremities, visual disturbance, fever, chills, or neck pain.  Patient states she feels like there may be fluid in her ears.  No episodes of dizziness like this in the past.  Patient not currently on any type of anti-coagulation.  VS stable on arrival.  Past Medical History  Diagnosis Date  . CANDIDIASIS, VAGINAL 01/08/2010  . DEPRESSION 08/09/2009  . TRANSAMINASES, SERUM, ELEVATED 08/14/2009  . Anorexia nervosa teen  . Anemia   . Insomnia   . STD (sexually transmitted disease)     HSV2?   Past Surgical History  Procedure Laterality Date  . Cesarean section      X 3  . Dilitation & currettage/hystroscopy with hydrothermal ablation    . Breast enhancement surgery Bilateral   . Hysteroscopy  12/27/07    and HTA  . Endometrial ablation  12/27/07   Family History  Problem Relation Age of Onset  . Hypertension Mother   . Heart disease Father     [id number]  . Stroke Father   . Heart disease Paternal Grandfather    History  Substance Use Topics  . Smoking status: Former Smoker -- 0.50 packs/day for 5 years    Types:  Cigarettes    Quit date: 07/10/1987  . Smokeless tobacco: Never Used  . Alcohol Use: No   OB History    Gravida Para Term Preterm AB TAB SAB Ectopic Multiple Living   3 3 3       3      Review of Systems  Gastrointestinal: Positive for nausea.  Neurological: Positive for dizziness.  All other systems reviewed and are negative.     Allergies  Ciprofloxacin and Oxycodone-acetaminophen  Home Medications   Prior to Admission medications   Medication Sig Start Date End Date Taking? Authorizing Provider  clonazePAM (KLONOPIN) 0.5 MG tablet as needed. 01/18/14   Historical Provider, MD  Estradiol 10 MCG TABS vaginal tablet Place 1 tablet (10 mcg total) vaginally 2 (two) times a week. 03/20/14   Regina Eck, CNM  JUBLIA 10 % SOLN daily. 01/05/14   Historical Provider, MD  temazepam (RESTORIL) 15 MG capsule at bedtime. 01/23/14   Historical Provider, MD  valACYclovir (VALTREX) 500 MG tablet daily. 02/05/14   Historical Provider, MD  Vortioxetine HBr (BRINTELLIX) 10 MG TABS Take 1 tablet by mouth daily.    Historical Provider, MD   BP 121/70 mmHg  Pulse 57  Temp(Src) 97.5 F (36.4 C)  Resp 18  SpO2 100%  LMP 12/27/2007   Physical Exam  Constitutional: She is oriented to person, place, and time. She appears well-developed and well-nourished.  HENT:  Head: Normocephalic and atraumatic.  Right Ear: Tympanic membrane and ear canal normal.  Left Ear: Tympanic membrane and ear canal normal.  Nose: Nose normal.  Mouth/Throat: Uvula is midline, oropharynx is clear and moist and mucous membranes are normal. No oropharyngeal exudate, posterior oropharyngeal edema, posterior oropharyngeal erythema or tonsillar abscesses.  Eyes: Conjunctivae and EOM are normal. Pupils are equal, round, and reactive to light.  Neck: Normal range of motion.  Cardiovascular: Normal rate, regular rhythm and normal heart sounds.   Pulmonary/Chest: Effort normal and breath sounds normal. No respiratory  distress. She has no wheezes.  Abdominal: Soft. Bowel sounds are normal. There is no tenderness. There is no guarding.  Musculoskeletal: Normal range of motion.  Neurological: She is alert and oriented to person, place, and time.  AAOx3, answering questions appropriately; equal strength UE and LE bilaterally; CN grossly intact; moves all extremities appropriately without ataxia; normal coordination, negative pronator drift, focal neuro deficits or facial asymmetry appreciated; + head turn test  Skin: Skin is warm and dry.  Psychiatric: She has a normal mood and affect. Her speech is normal.  Nursing note and vitals reviewed.   ED Course  Procedures (including critical care time) Labs Review Labs Reviewed  COMPREHENSIVE METABOLIC PANEL - Abnormal; Notable for the following:    Sodium 132 (*)    Potassium 3.4 (*)    Chloride 93 (*)    Glucose, Bld 107 (*)    AST 68 (*)    ALT 100 (*)    All other components within normal limits  URINE RAPID DRUG SCREEN (HOSP PERFORMED) - Abnormal; Notable for the following:    Benzodiazepines POSITIVE (*)    All other components within normal limits  URINALYSIS, ROUTINE W REFLEX MICROSCOPIC - Abnormal; Notable for the following:    APPearance HAZY (*)    All other components within normal limits  PROTIME-INR  APTT  CBC  DIFFERENTIAL  TROPONIN I  ETHANOL    Imaging Review Ct Head Wo Contrast  05/01/2014   CLINICAL DATA:  54 year old with dizziness.  EXAM: CT HEAD WITHOUT CONTRAST  TECHNIQUE: Contiguous axial images were obtained from the base of the skull through the vertex without contrast.  COMPARISON:  None  FINDINGS: There are calcifications in the basal ganglia bilaterally. No evidence for acute hemorrhage, mass lesion, midline shift, hydrocephalus or large infarct. Visualized paranasal sinuses are clear. Mastoid air cells are clear. No acute bone abnormality.  IMPRESSION: No acute intracranial abnormality.   Electronically Signed   By: Markus Daft M.D.   On: 05/01/2014 17:46     EKG Interpretation None      MDM   Final diagnoses:  Dizziness  Nausea   Patient 29-year-old female with sudden onset of dizziness while driving this afternoon. On arrival to ED, patient states symptoms have improved but not entirely resolved. Her neurologic exam is nonfocal-- + head turn test in room. Lab work is reassuring. CT head negative for acute findings.  After IV fluids, Zofran, and meclizine patient states all symptoms have resolved. She is currently sitting in bed reading a book in no acute distress.  Patient was able to get out of bed and ambulate in the hall without difficulty. She had a steady, non-ataxic gait. Upon returning to room, she did have a brief episode of hypotension. She was given additional fluids with improvement of her blood pressure. She continues to feel well without any recurrent nausea or dizziness.  Given patient's exam findings and improvement with  supportive treatment, feel that her symptoms are likely due to peripheral vertigo, low suspicion for cerebellar stroke or other acute intracranial pathology at this time.  Patient will be discharged home with zofran and meclizine as needed.  Encouraged close FU with PCP.  Discussed plan with patient, he/she acknowledged understanding and agreed with plan of care.  Return precautions given for new or worsening symptoms.  Case discussed with attending physician, Dr. Christy Gentles, who evaluated patient and agrees with assessment and plan of care.  Larene Pickett, PA-C 05/01/14 [place], MD 05/02/14 [phone]

## 2014-05-01 NOTE — Telephone Encounter (Signed)
[facility] - Client [place] [facility] Patient Name: Heather Davila Gender: Unknown DOB: 1960/06/23 Age: 54 Y 19 D Return Phone Number: [phone] (Primary) Address: [address] City/State/Zip: [place] [address] Client [facility] - Client Client Site [place] - Day Physician Carolann Littler Contact Type Call Call Type Triage / Clinical Relationship To Patient Self Return Phone Number [phone] (Primary) Chief Complaint Dizziness Initial Comment Caller states she is dizzy, thinks having ear problems. Nurse Assessment Guidelines Guideline Title Affirmed Question Affirmed Notes Nurse Date/Time (Eastern Time) Disp. Time Eilene Ghazi Time) Disposition Final User 05/01/2014 2:53:21 PM Attempt made - message left Fredna Dow 05/01/2014 3:10:33 PM Send To Clinical Follow Up Houston Siren, RN, Wells Guiles 05/01/2014 3:16:17 PM Attempt made - message left Loletta Specter, RN, Wells Guiles 05/01/2014 3:27:29 PM FINAL ATTEMPT MADE - message left Yes Loletta Specter RN, Wells Guiles After Care Instructions Given Call Event Type User Date / Time

## 2014-05-02 ENCOUNTER — Ambulatory Visit (INDEPENDENT_AMBULATORY_CARE_PROVIDER_SITE_OTHER): Payer: BLUE CROSS/BLUE SHIELD | Admitting: Family Medicine

## 2014-05-02 ENCOUNTER — Encounter: Payer: Self-pay | Admitting: Family Medicine

## 2014-05-02 VITALS — BP 110/60 | HR 64 | Temp 97.4°F | Wt 106.0 lb

## 2014-05-02 DIAGNOSIS — R7401 Elevation of levels of liver transaminase levels: Secondary | ICD-10-CM

## 2014-05-02 DIAGNOSIS — R740 Nonspecific elevation of levels of transaminase and lactic acid dehydrogenase [LDH]: Secondary | ICD-10-CM

## 2014-05-02 DIAGNOSIS — R42 Dizziness and giddiness: Secondary | ICD-10-CM

## 2014-05-02 NOTE — Telephone Encounter (Signed)
Pt has been sche for 9:45 today. Lm last night on vm. Confirmed w/ pt this am.

## 2014-05-02 NOTE — Progress Notes (Signed)
Pre visit review using our clinic review tool, if applicable. No additional management support is needed unless otherwise documented below in the visit note. 

## 2014-05-02 NOTE — Progress Notes (Signed)
Subjective:    Patient ID: Heather Davila, female    DOB: 1960/12/01, 54 y.o.   MRN: [id number]  HPI Emergency room follow-up. Patient was sitting yesterday with acute onset around 1:45 PM of dizziness and nausea without vomiting. This sounded like vertigo type of dizziness. She did recall having some bilateral ear pressure but ear exam was unremarkable. She had no syncope or loss of consciousness. She denied any change of hearing, tinnitus, confusion, speech changes, dysphagia, or any focal weakness. No recent fevers or chills. She's had somewhat similar symptoms rarely in the past. Patient had CT of head which showed no acute abnormalities. She had lab work significant for sodium 132 potassium 3.4. She had mildly elevated AST and ALTs levels which been positive in the past. She is followed by psychiatry and takes Klonopin and Restoril for chronic insomnia.   She feels well today. No dizziness. No chest pains. She received Zofran yesterday for nausea which did help  Regarding elevated liver transaminases. She's had mild elevations for several years. She's had previous hepatitis testing normal. She states her 68 year old mother has had elevated liver enzymes all of her life and they never found a clear etiology. Patient relates that she had ultrasound from another physician 2 years ago reportedly normal. We have no record of that x-ray. No alcohol use. She's had previous hepatitis B vaccinations  Past Medical History  Diagnosis Date  . CANDIDIASIS, VAGINAL 01/08/2010  . DEPRESSION 08/09/2009  . TRANSAMINASES, SERUM, ELEVATED 08/14/2009  . Anorexia nervosa teen  . Anemia   . Insomnia   . STD (sexually transmitted disease)     HSV2?   Past Surgical History  Procedure Laterality Date  . Cesarean section      X 3  . Dilitation & currettage/hystroscopy with hydrothermal ablation    . Breast enhancement surgery Bilateral   . Hysteroscopy  12/27/07    and HTA  . Endometrial ablation  12/27/07     reports that she quit smoking about 26 years ago. Her smoking use included Cigarettes. She has a 2.5 pack-year smoking history. She has never used smokeless tobacco. She reports that she does not drink alcohol or use illicit drugs. family history includes Heart disease in her father and paternal grandfather; Hypertension in her mother; Stroke in her father. Allergies  Allergen Reactions  . Ciprofloxacin     REACTION: hives  . Oxycodone-Acetaminophen     REACTION: hives      Review of Systems  Constitutional: Negative for fever and chills.  Respiratory: Negative for cough and shortness of breath.   Cardiovascular: Negative for chest pain.  Neurological: Positive for dizziness. Negative for syncope, weakness and headaches.  Hematological: Negative for adenopathy. Does not bruise/bleed easily.  Psychiatric/Behavioral: Negative for confusion.       Objective:   Physical Exam  Constitutional: She is oriented to person, place, and time. She appears well-developed and well-nourished.  HENT:  Right Ear: External ear normal.  Mouth/Throat: Oropharynx is clear and moist.  She has little bit of blood and bruising to the junction of the superior aspect of the left ear drum in the ear canal but no active bleeding. No signs of eardrum perforation  Neck: Neck supple.  Cardiovascular: Normal rate and regular rhythm.   Pulmonary/Chest: Effort normal and breath sounds normal.  Musculoskeletal: She exhibits no edema.  Neurological: She is alert and oriented to person, place, and time. No cranial nerve deficit. Coordination normal.  Gait normal. No Strength deficits  Psychiatric: She has a normal mood and affect. Her behavior is normal. Judgment and thought content normal.          Assessment & Plan:  #1 transient vertigo. Probable benign peripheral positional  vertigo. Nonfocal exam today.  Recommended observation #2 chronic elevated liver transaminases. She states these have been  elevated for several years without clear etiology. By recent labs these are just slightly more elevated. Check on prior testing and then will set up further lab work to investigate. We'll try to track down ultrasound she reportedly had 2 years ago.

## 2014-05-02 NOTE — Patient Instructions (Signed)

## 2014-05-03 ENCOUNTER — Other Ambulatory Visit: Payer: BC Managed Care – PPO

## 2014-05-03 ENCOUNTER — Telehealth: Payer: Self-pay

## 2014-05-03 NOTE — Telephone Encounter (Signed)
Can you please call this patient for a lab appointment. Thank you.

## 2014-05-03 NOTE — Telephone Encounter (Signed)
Lm on vm to cb °

## 2014-05-04 ENCOUNTER — Other Ambulatory Visit (INDEPENDENT_AMBULATORY_CARE_PROVIDER_SITE_OTHER): Payer: BLUE CROSS/BLUE SHIELD

## 2014-05-04 DIAGNOSIS — R7401 Elevation of levels of liver transaminase levels: Secondary | ICD-10-CM

## 2014-05-04 DIAGNOSIS — R740 Nonspecific elevation of levels of transaminase and lactic acid dehydrogenase [LDH]: Secondary | ICD-10-CM

## 2014-05-04 LAB — IRON AND TIBC
%SAT: 23 % (ref 20–55)
Iron: 86 ug/dL (ref 42–145)
TIBC: 366 ug/dL (ref 250–470)
UIBC: 280 ug/dL (ref 125–400)

## 2014-05-04 NOTE — Telephone Encounter (Signed)
Pt has been scheduled today for lab appt

## 2014-05-05 ENCOUNTER — Encounter: Payer: Self-pay | Admitting: Family Medicine

## 2014-05-05 LAB — HEPATITIS C ANTIBODY: HCV Ab: NEGATIVE

## 2014-05-05 LAB — ANA: Anti Nuclear Antibody(ANA): NEGATIVE

## 2014-05-08 LAB — CERULOPLASMIN: Ceruloplasmin: 27 mg/dL (ref 18–53)

## 2014-05-08 LAB — ALPHA-1-ANTITRYPSIN: A-1 Antitrypsin, Ser: 108 mg/dL (ref 83–199)

## 2014-05-09 LAB — ANTI-MICROSOMAL ANTIBODY LIVER / KIDNEY: LKM1 Ab: <=20 U (ref <=20.0)

## 2014-05-10 ENCOUNTER — Encounter: Payer: Self-pay | Admitting: Family Medicine

## 2014-05-15 ENCOUNTER — Encounter: Payer: BC Managed Care – PPO | Admitting: Family Medicine

## 2014-05-18 ENCOUNTER — Encounter: Payer: BC Managed Care – PPO | Admitting: Family Medicine

## 2014-05-18 ENCOUNTER — Other Ambulatory Visit: Payer: Self-pay

## 2014-05-18 MED ORDER — ARMODAFINIL 150 MG PO TABS: 150.0000 mg | ORAL_TABLET | Freq: Every day | ORAL | Status: DC

## 2014-05-29 ENCOUNTER — Encounter: Payer: Self-pay | Admitting: Family Medicine

## 2014-07-31 ENCOUNTER — Ambulatory Visit: Payer: BC Managed Care – PPO | Admitting: Nurse Practitioner

## 2014-08-09 ENCOUNTER — Encounter: Payer: Self-pay | Admitting: Family Medicine

## 2014-08-16 ENCOUNTER — Other Ambulatory Visit: Payer: Self-pay

## 2014-08-16 ENCOUNTER — Other Ambulatory Visit: Payer: Self-pay | Admitting: Certified Nurse Midwife

## 2014-08-16 DIAGNOSIS — N952 Postmenopausal atrophic vaginitis: Secondary | ICD-10-CM

## 2014-08-16 DIAGNOSIS — Z1231 Encounter for screening mammogram for malignant neoplasm of breast: Secondary | ICD-10-CM

## 2014-08-16 NOTE — Telephone Encounter (Signed)
Medication refill request: Estradiol  Last AEX:  07/18/13 PG Next AEX: 10/26/14 PG Last MMG (if hormonal medication request): 2014? Refill authorized: 03/20/14 #16tab/ 2 R to [facility].  Called patient to ask about MMG. Patient states she will call back and hang up the phone. Was unable to tell her that the phones were off and that she has to call at 1:30pm.   Routed to DL.  PG out of the office

## 2014-08-16 NOTE — Telephone Encounter (Signed)
Pt requests refill on vagifem - Generic estradiol  Pharmacy: [facility]

## 2014-08-16 NOTE — Telephone Encounter (Signed)
Patient called back. She states has not had a MMG. She will call today to make appt.

## 2014-08-16 NOTE — Telephone Encounter (Addendum)
Patient notified. Verbalized understanding.   Will need refill a soon as you get MMG report. Please Mrs. Heather Davila  - Encounter closed

## 2014-08-16 NOTE — Telephone Encounter (Signed)
Will need current mammogram before refill

## 2014-08-25 ENCOUNTER — Encounter: Payer: Self-pay | Admitting: Family Medicine

## 2014-08-28 ENCOUNTER — Other Ambulatory Visit: Payer: Self-pay | Admitting: Family Medicine

## 2014-08-28 DIAGNOSIS — R748 Abnormal levels of other serum enzymes: Secondary | ICD-10-CM

## 2014-08-29 ENCOUNTER — Ambulatory Visit
Admission: RE | Admit: 2014-08-29 | Discharge: 2014-08-29 | Disposition: A | Discharge status: Home / Self Care | Payer: BLUE CROSS/BLUE SHIELD | Source: Ambulatory Visit

## 2014-08-29 DIAGNOSIS — Z1231 Encounter for screening mammogram for malignant neoplasm of breast: Secondary | ICD-10-CM

## 2014-08-30 MED ORDER — ESTRADIOL 10 MCG VA TABS: 1.0000 | ORAL_TABLET | VAGINAL | Status: DC

## 2014-08-30 NOTE — Addendum Note (Signed)
Addended by: Elroy Channel on: 08/30/2014 11:34 AM   Modules accepted: Orders

## 2014-08-30 NOTE — Telephone Encounter (Signed)
MMG 08/29/14 BIRADS1:Neg   Please approve or deny Rx Routed to DL

## 2014-10-02 ENCOUNTER — Encounter: Payer: Self-pay | Admitting: Family Medicine

## 2014-10-26 ENCOUNTER — Ambulatory Visit (INDEPENDENT_AMBULATORY_CARE_PROVIDER_SITE_OTHER): Payer: BLUE CROSS/BLUE SHIELD | Admitting: Nurse Practitioner

## 2014-10-26 ENCOUNTER — Encounter: Payer: Self-pay | Admitting: Nurse Practitioner

## 2014-10-26 VITALS — BP 96/64 | HR 64 | Ht 60.25 in | Wt 104.0 lb

## 2014-10-26 DIAGNOSIS — Z0000 Encounter for general adult medical examination without abnormal findings: Secondary | ICD-10-CM

## 2014-10-26 DIAGNOSIS — Z01419 Encounter for gynecological examination (general) (routine) without abnormal findings: Secondary | ICD-10-CM | POA: Diagnosis not present

## 2014-10-26 DIAGNOSIS — N952 Postmenopausal atrophic vaginitis: Secondary | ICD-10-CM

## 2014-10-26 LAB — POCT URINALYSIS DIPSTICK
BILIRUBIN UA: NEGATIVE
Blood, UA: NEGATIVE
GLUCOSE UA: NEGATIVE
Ketones, UA: NEGATIVE
Leukocytes, UA: NEGATIVE
Nitrite, UA: NEGATIVE
PH UA: 8.5
PROTEIN UA: NEGATIVE
Urobilinogen, UA: NEGATIVE

## 2014-10-26 MED ORDER — ESTRADIOL 10 MCG VA TABS: 1.0000 | ORAL_TABLET | VAGINAL | Status: DC

## 2014-10-26 MED ORDER — VALACYCLOVIR HCL 500 MG PO TABS: 500.0000 mg | ORAL_TABLET | Freq: Two times a day (BID) | ORAL | Status: DC

## 2014-10-26 NOTE — Patient Instructions (Signed)

## 2014-10-26 NOTE — Progress Notes (Signed)
Encounter reviewed by Dr. Jaryn Hocutt Amundson C. Silva.  

## 2014-10-26 NOTE — Progress Notes (Signed)
Patient ID: Heather Davila, female   DOB: 06/01/1960, 54 y.o.   MRN: [id number] 54 y.o. [id number] Married  Caucasian Fe here for annual exam.  Doing well without new health problems.  She did have sleep study done and was negative.  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  Husband with history of HSV for 26 years - first known episode for her was 07/2013.  She did take suppressive therapy for awhile, but now only prn.   Patient's last menstrual period was 12/27/2007 (exact date).          Sexually active: Yes.    The current method of family planning is vasectomy and post menopausal status.    Exercising: Yes.    walking the dog Smoker:  no  Health Maintenance: Pap:  05/31/12, WNL, neg HR HPV MMG:  08/29/14, Bi-Rads 1:  negative  Colonoscopy:  10/17/11, poor prep, redo 2018 TDaP:  2013 Labs:  HB:  14.1  Urine:  Negative     reports that she quit smoking about 27 years ago. Her smoking use included Cigarettes. She has a 2.5 pack-year smoking history. She has never used smokeless tobacco. She reports that she does not drink alcohol or use illicit drugs.  Past Medical History  Diagnosis Date  . CANDIDIASIS, VAGINAL 01/08/2010  . DEPRESSION 08/09/2009  . TRANSAMINASES, SERUM, ELEVATED 08/14/2009  . Anorexia nervosa teen  . Anemia   . Insomnia   . STD (sexually transmitted disease)     HSV2?    Past Surgical History  Procedure Laterality Date  . Cesarean section      X 3  . Dilitation & currettage/hystroscopy with hydrothermal ablation    . Breast enhancement surgery Bilateral   . Hysteroscopy  12/27/07    and HTA  . Endometrial ablation  12/27/07    Current Outpatient Prescriptions  Medication Sig Dispense Refill  . buPROPion (WELLBUTRIN) 100 MG tablet Take 1.5 tablets by mouth daily.    . clonazePAM (KLONOPIN) 0.5 MG tablet Take 0.5 mg by mouth as needed.     . Estradiol 10 MCG TABS vaginal tablet Place 1 tablet (10 mcg total) vaginally 2 (two) times a week. 24 tablet 3   . ibuprofen (ADVIL,MOTRIN) 200 MG tablet Take 200 mg by mouth every 6 (six) hours as needed.    . temazepam (RESTORIL) 15 MG capsule Take 15 mg by mouth at bedtime.     . Vortioxetine HBr (BRINTELLIX) 10 MG TABS Take 1 tablet by mouth daily.    . JUBLIA 10 % SOLN daily.    . valACYclovir (VALTREX) 500 MG tablet Take 1 tablet (500 mg total) by mouth 2 (two) times daily. 90 tablet 3   No current facility-administered medications for this visit.    Family History  Problem Relation Age of Onset  . Hypertension Mother   . Heart disease Father     [id number]  . Stroke Father   . Heart disease Paternal Grandfather     ROS:  Pertinent items are noted in HPI.  Otherwise, a comprehensive ROS was negative.  Exam:   BP 96/64 mmHg  Pulse 64  Ht 5' 0.25" (1.53 m)  Wt 104 lb (47.174 kg)  BMI 20.15 kg/m2  LMP 12/27/2007 (Exact Date) Height: 5' 0.25" (153 cm) Ht Readings from Last 3 Encounters:  10/26/14 5' 0.25" (1.53 m)  03/20/14 5' (1.524 m)  02/13/14 5' (1.524 m)    General appearance: alert, cooperative and appears stated age Head:  Normocephalic, without obvious abnormality, atraumatic Neck: no adenopathy, supple, symmetrical, trachea midline and thyroid normal to inspection and palpation Lungs: clear to auscultation bilaterally Breasts: normal appearance, no masses or tenderness, positive findings: implants on the right remains the same irregularity of 'wave" without implant rupture. Heart: regular rate and rhythm Abdomen: soft, non-tender; no masses,  no organomegaly Extremities: extremities normal, atraumatic, no cyanosis or edema Skin: Skin color, texture, turgor normal. No rashes or lesions Lymph nodes: Cervical, supraclavicular, and axillary nodes normal. No abnormal inguinal nodes palpated Neurologic: Grossly normal   Pelvic: External genitalia:  no lesions              Urethra:  normal appearing urethra with no masses, tenderness or lesions              Bartholin's and  Skene's: normal                 Vagina: normal appearing vagina with normal color and discharge, no lesions              Cervix: anteverted              Pap taken: No. Bimanual Exam:  Uterus:  normal size, contour, position, consistency, mobility, non-tender              Adnexa: no mass, fullness, tenderness               Rectovaginal: Confirms               Anus:  normal sphincter tone, no lesions  Chaperone present:  no  A:  Well Woman with normal exam  Postmenopausal no longer on bio identical hormones Low libido  Atrophic vaginitis - improved with Vagifem  New diagnoses of HSV - spouse with 46 yr history of HSV    P:   Reviewed health and wellness pertinent to exam  Pap smear as above  Mammogram is due 5/17  Refill on Vagifem and Valtrex for a year  Counseled on breast self exam, mammography screening, adequate intake of calcium and vitamin D, diet and exercise return annually or prn  An After Visit Summary was printed and given to the patient.

## 2014-10-27 LAB — COMPREHENSIVE METABOLIC PANEL
ALK PHOS: 60 U/L (ref 39–117)
ALT: 156 U/L — AB (ref 0–35)
AST: 100 U/L — ABNORMAL HIGH (ref 0–37)
Albumin: 4.5 g/dL (ref 3.5–5.2)
BUN: 13 mg/dL (ref 6–23)
CALCIUM: 9.8 mg/dL (ref 8.4–10.5)
CO2: 30 mEq/L (ref 19–32)
Chloride: 99 mEq/L (ref 96–112)
Creat: 0.62 mg/dL (ref 0.50–1.10)
GLUCOSE: 72 mg/dL (ref 70–99)
Potassium: 4.6 mEq/L (ref 3.5–5.3)
Sodium: 141 mEq/L (ref 135–145)
TOTAL PROTEIN: 7.2 g/dL (ref 6.0–8.3)
Total Bilirubin: 0.4 mg/dL (ref 0.2–1.2)

## 2014-10-27 LAB — VITAMIN D 25 HYDROXY (VIT D DEFICIENCY, FRACTURES): Vit D, 25-Hydroxy: 57 ng/mL (ref 30–100)

## 2014-10-27 LAB — LIPID PANEL
CHOL/HDL RATIO: 4.9 ratio
Cholesterol: 206 mg/dL — ABNORMAL HIGH (ref 0–200)
HDL: 42 mg/dL — ABNORMAL LOW (ref >=46)
LDL CALC: 144 mg/dL — AB (ref 0–99)
TRIGLYCERIDES: 99 mg/dL (ref <150)
VLDL: 20 mg/dL (ref 0–40)

## 2014-10-27 LAB — TSH: TSH: 2.539 u[IU]/mL (ref 0.350–4.500)

## 2014-10-31 LAB — HEMOGLOBIN, FINGERSTICK: Hemoglobin, fingerstick: 14.1 g/dL (ref 12.0–16.0)

## 2014-11-02 ENCOUNTER — Telehealth: Payer: Self-pay | Admitting: Family Medicine

## 2014-11-02 NOTE — Telephone Encounter (Signed)
Results are in patient chart.

## 2014-11-02 NOTE — Telephone Encounter (Addendum)
Pt had hepatic function done at her OGYN and the results were elevated. Pt would like to know if you need to see her

## 2014-11-02 NOTE — Telephone Encounter (Signed)
I recommend that she get abdominal ultrasound to further assess.  Several recent screening labs for etiology of transaminasemia were negative.

## 2014-11-03 ENCOUNTER — Other Ambulatory Visit: Payer: Self-pay | Admitting: Family Medicine

## 2014-11-03 DIAGNOSIS — R740 Nonspecific elevation of levels of transaminase and lactic acid dehydrogenase [LDH]: Principal | ICD-10-CM

## 2014-11-03 DIAGNOSIS — R7401 Elevation of levels of liver transaminase levels: Secondary | ICD-10-CM

## 2014-11-03 NOTE — Addendum Note (Signed)
Addended by: Noe Gens E on: 11/03/2014 01:16 PM   Modules accepted: Orders

## 2014-11-03 NOTE — Telephone Encounter (Signed)
Elevated liver transaminases.

## 2014-11-03 NOTE — Telephone Encounter (Signed)
Pt informed. DX code?

## 2014-11-03 NOTE — Telephone Encounter (Signed)
Ultrasound ordered

## 2014-11-09 ENCOUNTER — Telehealth: Payer: Self-pay | Admitting: Family Medicine

## 2014-11-09 NOTE — Telephone Encounter (Signed)
OK to order but need to know which side- right or left?

## 2014-11-09 NOTE — Telephone Encounter (Signed)
Pt has an appt at [facility] and would like to have a  xray of  ribs. Pt son gave her a bear hug and they heard a pop.

## 2014-11-10 ENCOUNTER — Ambulatory Visit
Admission: RE | Admit: 2014-11-10 | Discharge: 2014-11-10 | Disposition: A | Discharge status: Home / Self Care | Payer: BLUE CROSS/BLUE SHIELD | Source: Ambulatory Visit | Attending: Family Medicine | Admitting: Family Medicine

## 2014-11-10 DIAGNOSIS — R740 Nonspecific elevation of levels of transaminase and lactic acid dehydrogenase [LDH]: Principal | ICD-10-CM

## 2014-11-10 DIAGNOSIS — R7402 Elevation of levels of lactic acid dehydrogenase (LDH): Secondary | ICD-10-CM

## 2014-11-13 NOTE — Telephone Encounter (Signed)
Called and pt stated that she is feeling better and  That she wants to hold off on getting a Xray.

## 2014-11-14 ENCOUNTER — Other Ambulatory Visit: Payer: Self-pay | Admitting: Family Medicine

## 2014-11-14 DIAGNOSIS — R748 Abnormal levels of other serum enzymes: Secondary | ICD-10-CM

## 2014-11-14 DIAGNOSIS — K838 Other specified diseases of biliary tract: Secondary | ICD-10-CM

## 2014-11-15 ENCOUNTER — Encounter: Payer: Self-pay | Admitting: Gastroenterology

## 2015-01-19 ENCOUNTER — Ambulatory Visit: Payer: BLUE CROSS/BLUE SHIELD | Admitting: Gastroenterology

## 2015-03-29 ENCOUNTER — Ambulatory Visit: Payer: BC Managed Care – PPO | Admitting: Nurse Practitioner

## 2015-04-17 ENCOUNTER — Encounter: Payer: Self-pay | Admitting: Family Medicine

## 2015-04-17 ENCOUNTER — Ambulatory Visit (INDEPENDENT_AMBULATORY_CARE_PROVIDER_SITE_OTHER): Payer: BLUE CROSS/BLUE SHIELD | Admitting: Family Medicine

## 2015-04-17 VITALS — BP 90/68 | HR 62 | Resp 14 | Ht 60.25 in | Wt 106.4 lb

## 2015-04-17 DIAGNOSIS — R413 Other amnesia: Secondary | ICD-10-CM | POA: Diagnosis not present

## 2015-04-17 LAB — VITAMIN B12: Vitamin B-12: 1398 pg/mL — ABNORMAL HIGH (ref 211–911)

## 2015-04-17 NOTE — Progress Notes (Signed)
Pre visit review using our clinic review tool, if applicable. No additional management support is needed unless otherwise documented below in the visit note. 

## 2015-04-17 NOTE — Progress Notes (Signed)
   Subjective:    Patient ID: Heather Davila, female    DOB: 1960/09/05, 54 y.o.   MRN: [id number]  HPI Patient seen with concern for cognitive changes Still works full-time. She's had testing in the past for ADD and was diagnosed with this by psychiatrist. She took Adderall but did not like side effects and stopped this. She does take Wellbutrin Denies depression.  She relates a list two-year history of some short-term memory deficits.  Sometimes difficulty retrieving words. Sometimes has difficulty with comprehension She feels it is more difficult to learn past. Recent TSH normal. CT head January 2016 no acute changes. No recent headaches. No specific risk factors for B12 deficiency.  Does have poor sleep at times and pills this definitely has an impact negatively on her memory. Denies any new stressors.  Past Medical History  Diagnosis Date  . CANDIDIASIS, VAGINAL 01/08/2010  . DEPRESSION 08/09/2009  . TRANSAMINASES, SERUM, ELEVATED 08/14/2009  . Anorexia nervosa teen  . Anemia   . Insomnia   . STD (sexually transmitted disease)     HSV2?   Past Surgical History  Procedure Laterality Date  . Cesarean section      X 3  . Dilitation & currettage/hystroscopy with hydrothermal ablation    . Breast enhancement surgery Bilateral   . Hysteroscopy  12/27/07    and HTA  . Endometrial ablation  12/27/07    reports that she quit smoking about 27 years ago. Her smoking use included Cigarettes. She has a 2.5 pack-year smoking history. She has never used smokeless tobacco. She reports that she does not drink alcohol or use illicit drugs. family history includes Heart disease in her father and paternal grandfather; Hypertension in her mother; Stroke in her father. Allergies  Allergen Reactions  . Ciprofloxacin     REACTION: hives  . Oxycodone-Acetaminophen     REACTION: hives      Review of Systems  Constitutional: Negative for appetite change, fatigue and unexpected weight change.    Eyes: Negative for visual disturbance.  Respiratory: Negative for cough, chest tightness, shortness of breath and wheezing.   Cardiovascular: Negative for chest pain, palpitations and leg swelling.  Genitourinary: Negative for dysuria.  Neurological: Negative for dizziness, seizures, syncope, weakness, light-headedness and headaches.       Objective:   Physical Exam  Constitutional: She is oriented to person, place, and time. She appears well-developed and well-nourished.  Eyes: Pupils are equal, round, and reactive to light.  Neck: Neck supple. No JVD present. No thyromegaly present.  Cardiovascular: Normal rate and regular rhythm.  Exam reveals no gallop.   Pulmonary/Chest: Effort normal and breath sounds normal. No respiratory distress. She has no wheezes. She has no rales.  Musculoskeletal: She exhibits no edema.  Neurological: She is alert and oriented to person, place, and time. No cranial nerve deficit.  Psychiatric: She has a normal mood and affect. Her behavior is normal. Judgment and thought content normal.  MMSE 30 out of 30          Assessment & Plan:  Patient presents with concern for cognitive impairment. She scores 30 out of 30 on Mini-Mental status exam. Will check B12 and RPR. We discussed other things that can effect cognitive performance such as adequate sleep and stress management. Routine follow-up 6 months and repeat cognitive testing then

## 2015-04-18 LAB — RPR

## 2015-04-25 ENCOUNTER — Other Ambulatory Visit (INDEPENDENT_AMBULATORY_CARE_PROVIDER_SITE_OTHER): Payer: BLUE CROSS/BLUE SHIELD

## 2015-04-25 ENCOUNTER — Ambulatory Visit (INDEPENDENT_AMBULATORY_CARE_PROVIDER_SITE_OTHER): Payer: BLUE CROSS/BLUE SHIELD | Admitting: Gastroenterology

## 2015-04-25 ENCOUNTER — Encounter: Payer: Self-pay | Admitting: Gastroenterology

## 2015-04-25 VITALS — BP 90/62 | HR 60 | Ht 63.0 in | Wt 103.6 lb

## 2015-04-25 DIAGNOSIS — R748 Abnormal levels of other serum enzymes: Secondary | ICD-10-CM

## 2015-04-25 LAB — CBC WITH DIFFERENTIAL/PLATELET
BASOS ABS: 0 10*3/uL (ref 0.0–0.1)
Basophils Relative: 0.9 % (ref 0.0–3.0)
EOS ABS: 0.2 10*3/uL (ref 0.0–0.7)
Eosinophils Relative: 4.3 % (ref 0.0–5.0)
HCT: 43.7 % (ref 36.0–46.0)
HEMOGLOBIN: 14.6 g/dL (ref 12.0–15.0)
Lymphocytes Relative: 32.9 % (ref 12.0–46.0)
Lymphs Abs: 1.6 10*3/uL (ref 0.7–4.0)
MCHC: 33.5 g/dL (ref 30.0–36.0)
MCV: 97.2 fl (ref 78.0–100.0)
MONO ABS: 0.3 10*3/uL (ref 0.1–1.0)
Monocytes Relative: 6.9 % (ref 3.0–12.0)
Neutro Abs: 2.6 10*3/uL (ref 1.4–7.7)
Neutrophils Relative %: 55 % (ref 43.0–77.0)
PLATELETS: 290 10*3/uL (ref 150.0–400.0)
RBC: 4.49 Mil/uL (ref 3.87–5.11)
RDW: 12.4 % (ref 11.5–15.5)
WBC: 4.7 10*3/uL (ref 4.0–10.5)

## 2015-04-25 LAB — COMPREHENSIVE METABOLIC PANEL
ALT: 83 U/L — AB (ref 0–35)
AST: 54 U/L — AB (ref 0–37)
Albumin: 4.8 g/dL (ref 3.5–5.2)
Alkaline Phosphatase: 87 U/L (ref 39–117)
BILIRUBIN TOTAL: 0.4 mg/dL (ref 0.2–1.2)
BUN: 13 mg/dL (ref 6–23)
CALCIUM: 10.3 mg/dL (ref 8.4–10.5)
CO2: 31 meq/L (ref 19–32)
CREATININE: 0.66 mg/dL (ref 0.40–1.20)
Chloride: 100 mEq/L (ref 96–112)
GFR: 99.56 mL/min (ref >60.00)
Glucose, Bld: 110 mg/dL — ABNORMAL HIGH (ref 70–99)
Potassium: 4 mEq/L (ref 3.5–5.1)
SODIUM: 140 meq/L (ref 135–145)
Total Protein: 8.1 g/dL (ref 6.0–8.3)

## 2015-04-25 LAB — IGA: IgA: 113 mg/dL (ref 68–378)

## 2015-04-25 LAB — PROTIME-INR
INR: 1.1 ratio — ABNORMAL HIGH (ref 0.8–1.0)
PROTHROMBIN TIME: 11.8 s (ref 9.6–13.1)

## 2015-04-25 NOTE — Patient Instructions (Addendum)
You will get labs drawn today:  Hepatitis A (IgM and IgG), Hepatitis B surface antigen, Hepatitis B surface antibody,anti smooth muscle antibody, TTG, total IgA level, CBC, CMET, INR.   MRI liver with MRCP images for elevated liver tests, dilated CBD on 2016 [place].   You have been scheduled for an MRI at [facility]  on 05/09/15. Your appointment time is 8 am. Please arrive 15 minutes prior to your appointment time for registration purposes. Please make certain not to have anything to eat or drink 6 hours prior to your test. In addition, if you have any metal in your body, have a pacemaker or defibrillator, please be sure to let your ordering physician know. This test typically takes 45 minutes to 1 hour to complete.

## 2015-04-25 NOTE — Progress Notes (Signed)
HPI: This is a   Very pleasant 54 year old woman     who was referred to me by Eulas Post, MD  to evaluate   Elevated liver tests .    Chief complaint is  Elevated liver tests  Maybe 10 years of elevated liver tests.  No risk factors for hepatitis.  No risky behavior ( no tattoos, no IV drug use).   She has never had hepatitis, no family history of liver disease.  Has always been thin.   Tends to have slightly constipated bowels.   No abdominal pains.  No abd pains.  No jaundice.  [place] 10/2014: 1. The gallbladder is unremarkable. There is mild common bile ductal dilation without evidence of common duct stones. No pancreatic mass is observed. 2. The liver exhibits no focal mass or ductal dilation. 3. The spleen, kidneys, IVC, and abdominal aorta are unremarkable. 4. MRI of the abdomen is recommended for further evaluation of the liver and hepatobiliary tree  Labs 08/21/2014:  Hepatitis C antibody negative , ceruloplasmin normal, alpha-1 antitrypsin level normal , ANA negative, AMA negative,  Iron studies normal Liver tests since Aug 20, 2009:  AST and ALT have been intermittently elevated dating back at least to 20-Aug-2009 and they were 40 and 70 respectively. Most recently ALT and AST in the 1-200 range. The remaining liver tests have all been normal persistently.  Review of systems: Pertinent positive and negative review of systems were noted in the above HPI section. Complete review of systems was performed and was otherwise normal.   Past Medical History  Diagnosis Date  . CANDIDIASIS, VAGINAL 01/08/2010  . DEPRESSION 08/09/2009  . TRANSAMINASES, SERUM, ELEVATED 08/14/2009  . Anorexia nervosa teen  . Anemia   . Insomnia   . STD (sexually transmitted disease)     HSV2?    Past Surgical History  Procedure Laterality Date  . Cesarean section      X 3  . Dilitation & currettage/hystroscopy with hydrothermal ablation    . Breast enhancement surgery Bilateral   . Hysteroscopy  12/27/07   and HTA  . Endometrial ablation  12/27/07    Current Outpatient Prescriptions  Medication Sig Dispense Refill  . buPROPion (WELLBUTRIN) 100 MG tablet Take 1.5 tablets by mouth daily.    . clonazePAM (KLONOPIN) 0.5 MG tablet Take 0.5 mg by mouth 2 (two) times daily as needed for anxiety.    . Estradiol 10 MCG TABS vaginal tablet Place 1 tablet (10 mcg total) vaginally 2 (two) times a week. 24 tablet 3  . ibuprofen (ADVIL,MOTRIN) 200 MG tablet Take 200 mg by mouth every 6 (six) hours as needed.    . Vortioxetine HBr (BRINTELLIX) 10 MG TABS Take 1 tablet by mouth daily.     No current facility-administered medications for this visit.    Allergies as of 04/25/2015 - Review Complete 04/25/2015  Allergen Reaction Noted  . Ciprofloxacin  08/09/2009  . Oxycodone-acetaminophen  08/09/2009    Family History  Problem Relation Age of Onset  . Hypertension Mother   . Colon cancer Mother   . Heart disease Father     [id number]  . Stroke Father   . Heart disease Paternal Grandfather     Social History   Social History  . Marital Status: Married    Spouse Name: [name]  . Number of Children: 3  . Years of Education: Master's   Occupational History  . Nurse    Social History Main Topics  . Smoking status: Former Smoker --  0.50 packs/day for 5 years    Types: Cigarettes    Quit date: 07/10/1987  . Smokeless tobacco: Never Used  . Alcohol Use: No  . Drug Use: No  . Sexual Activity:    Partners: Male    Birth Control/ Protection: Post-menopausal, Surgical     Comment: vasectomy   Other Topics Concern  . Not on file   Social History Narrative   Patient is married [name]) and lives at home with her husband and two children.   Patient has three children.   Patient works at [employer].   Patient has a Master's degree.   Patient is left-handed.   Patient drinks 6 cups of coffee daily.     Physical Exam: BP 90/62 mmHg  Pulse 60  Ht 5\' 3"  (1.6 m)  Wt 103 lb 9.6 oz  (46.993 kg)  BMI 18.36 kg/m2  LMP 12/27/2007 (Exact Date) Constitutional: generally well-appearing Psychiatric: alert and oriented x3 Eyes: extraocular movements intact Mouth: oral pharynx moist, no lesions Neck: supple no lymphadenopathy Cardiovascular: heart regular rate and rhythm Lungs: clear to auscultation bilaterally Abdomen: soft, nontender, nondistended, no obvious ascites, no peritoneal signs, normal bowel sounds Extremities: no lower extremity edema bilaterally Skin: no lesions on visible extremities   Assessment and plan: 54 y.o. female with   Elevated liver tests, asymptomatic;  Slightly dilated common bile duct on 2016 ultrasound   her liver tests have been elevated for at least 5 years and she believes even possibly 10 years. Most recently transaminases are in the 1-200 range and I explained to her that I think we should try to understand exactly why her liver is irritated even if necessary proceeding with liver biopsy. She has no liver disease risk factors. She  Has already had several blood tests done those do not need be repeated now however she needs some further biochemical liver workup, see those labs in the instructions section below. She will also have an MRI with MRCP images to better understand her slightly dilated bile duct on recent ultrasound.   Owens Loffler, MD [facility] 04/25/2015, 9:09 AM  Cc: Eulas Post, MD

## 2015-04-26 LAB — HEPATITIS B SURFACE ANTIGEN: Hepatitis B Surface Ag: NEGATIVE

## 2015-04-26 LAB — HEPATITIS B SURFACE ANTIBODY,QUALITATIVE: HEP B S AB: POSITIVE — AB

## 2015-04-26 LAB — HEPATITIS A ANTIBODY, TOTAL: Hep A Total Ab: BORDERLINE — AB

## 2015-04-26 LAB — TISSUE TRANSGLUTAMINASE, IGA: Tissue Transglutaminase Ab, IgA: 1 U/mL (ref <4)

## 2015-04-27 LAB — ANTI-SMOOTH MUSCLE ANTIBODY, IGG: Smooth Muscle Ab: 9 U (ref <20)

## 2015-05-01 ENCOUNTER — Encounter: Payer: Self-pay | Admitting: Gastroenterology

## 2015-05-01 NOTE — Telephone Encounter (Signed)
Dr Ardis Hughs please review the pt question regarding the MRI/MRCP?

## 2015-05-09 ENCOUNTER — Ambulatory Visit (HOSPITAL_COMMUNITY)
Admission: RE | Admit: 2015-05-09 | Discharge: 2015-05-09 | Disposition: A | Discharge status: Home / Self Care | Payer: BLUE CROSS/BLUE SHIELD | Source: Ambulatory Visit | Attending: Gastroenterology | Admitting: Gastroenterology

## 2015-05-09 ENCOUNTER — Ambulatory Visit (HOSPITAL_COMMUNITY): Admission: RE | Admit: 2015-05-09 | Payer: BLUE CROSS/BLUE SHIELD | Source: Ambulatory Visit

## 2015-05-09 ENCOUNTER — Other Ambulatory Visit: Payer: Self-pay | Admitting: Gastroenterology

## 2015-05-09 DIAGNOSIS — R748 Abnormal levels of other serum enzymes: Secondary | ICD-10-CM

## 2015-05-09 DIAGNOSIS — R945 Abnormal results of liver function studies: Secondary | ICD-10-CM | POA: Insufficient documentation

## 2015-05-09 MED ORDER — GADOBENATE DIMEGLUMINE 529 MG/ML IV SOLN
10.0000 mL | Freq: Once | INTRAVENOUS | Status: AC | PRN
  Administered 2015-05-09: 10 mL via INTRAVENOUS

## 2015-05-10 ENCOUNTER — Other Ambulatory Visit: Payer: Self-pay

## 2015-05-10 DIAGNOSIS — R945 Abnormal results of liver function studies: Principal | ICD-10-CM

## 2015-05-10 DIAGNOSIS — R7989 Other specified abnormal findings of blood chemistry: Secondary | ICD-10-CM

## 2015-05-23 ENCOUNTER — Other Ambulatory Visit (HOSPITAL_COMMUNITY): Payer: BLUE CROSS/BLUE SHIELD

## 2015-05-23 ENCOUNTER — Encounter: Payer: Self-pay | Admitting: Family Medicine

## 2015-05-23 ENCOUNTER — Ambulatory Visit (INDEPENDENT_AMBULATORY_CARE_PROVIDER_SITE_OTHER): Payer: BLUE CROSS/BLUE SHIELD | Admitting: Family Medicine

## 2015-05-23 VITALS — BP 90/60 | HR 60 | Wt 105.8 lb

## 2015-05-23 DIAGNOSIS — M19041 Primary osteoarthritis, right hand: Secondary | ICD-10-CM

## 2015-05-23 DIAGNOSIS — M19042 Primary osteoarthritis, left hand: Secondary | ICD-10-CM | POA: Diagnosis not present

## 2015-05-23 NOTE — Progress Notes (Signed)
   Subjective:    Patient ID: Heather Davila, female    DOB: 14-Nov-1960, 55 y.o.   MRN: [id number]  HPI Patient seen with bilateral hand pain. She states that her mother and sister have osteoarthritis. There is no family history of known inflammatory arthritis. She has pain and stiffness which involves the DIP and PIP joints of several digits of the hand. She does not have any generalized polyarthralgias and his never see signs of inflammation such as warmth or erythema. She does not take daily medication. Sometimes has some early morning stiffness.  Past Medical History  Diagnosis Date  . CANDIDIASIS, VAGINAL 01/08/2010  . DEPRESSION 08/09/2009  . TRANSAMINASES, SERUM, ELEVATED 08/14/2009  . Anorexia nervosa teen  . Anemia   . Insomnia   . STD (sexually transmitted disease)     HSV2?   Past Surgical History  Procedure Laterality Date  . Cesarean section      X 3  . Dilitation & currettage/hystroscopy with hydrothermal ablation    . Breast enhancement surgery Bilateral   . Hysteroscopy  12/27/07    and HTA  . Endometrial ablation  12/27/07    reports that she quit smoking about 27 years ago. Her smoking use included Cigarettes. She has a 2.5 pack-year smoking history. She has never used smokeless tobacco. She reports that she does not drink alcohol or use illicit drugs. family history includes Colon cancer in her mother; Heart disease in her father and paternal grandfather; Hypertension in her mother; Stroke in her father. Allergies  Allergen Reactions  . Ciprofloxacin     REACTION: hives  . Oxycodone-Acetaminophen     REACTION: hives      Review of Systems  Constitutional: Negative for fever and chills.  Musculoskeletal: Positive for arthralgias. Negative for back pain, joint swelling and neck stiffness.       Objective:   Physical Exam  Constitutional: She appears well-developed and well-nourished.  Cardiovascular: Normal rate and regular rhythm.   Pulmonary/Chest:  Effort normal and breath sounds normal. No respiratory distress. She has no wheezes. She has no rales.  Musculoskeletal:  Patient has some mild nodular changes involving DIP and PIP joints of several fingers. No signs of inflammation such as erythema or warmth. No evidence for any joint effusion          Assessment & Plan:  Bilateral hand pain. She has exam and history consistent with osteoarthritis. We explained this will progress gradually over time. She is not requiring any medication this point. We do not see indication for x-rays or labs since her findings are consistent with degenerative arthritis.

## 2015-05-23 NOTE — Patient Instructions (Signed)

## 2015-05-23 NOTE — Progress Notes (Signed)
Pre visit review using our clinic review tool, if applicable. No additional management support is needed unless otherwise documented below in the visit note. 

## 2015-06-04 ENCOUNTER — Telehealth: Payer: Self-pay | Admitting: *Deleted

## 2015-06-04 ENCOUNTER — Telehealth: Payer: Self-pay | Admitting: Family Medicine

## 2015-06-04 NOTE — Telephone Encounter (Signed)
-----   Message from Algernon Huxley, RN sent at 05/10/2015 12:48 PM EST ----- Regarding: LFT's Pt needs LFT's, orders in epic.

## 2015-06-04 NOTE — Telephone Encounter (Signed)
Patient notified and she will come for labs. 

## 2015-06-04 NOTE — Telephone Encounter (Signed)
Pt states she sent over letter for excusable for jury duty this Thursday 2/9. 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 Pt would like to pick up the letter asap.  Pt sent a letter to them back on her on, but they sent back stating she needs letter form her pcp

## 2015-06-05 ENCOUNTER — Encounter: Payer: Self-pay | Admitting: Family Medicine

## 2015-06-05 NOTE — Telephone Encounter (Signed)
Patient called again concerning the letter to excuse her for jury duty.  She is getting anxious because she is suppose to report to court this Thursday, Feb 9th.

## 2015-06-06 NOTE — Telephone Encounter (Signed)
Let me know when this is ready.

## 2015-06-06 NOTE — Telephone Encounter (Signed)
Letter done

## 2015-06-06 NOTE — Telephone Encounter (Signed)
Letter printed, signed, and put up front for pick up. Pt is aware.

## 2015-06-08 ENCOUNTER — Other Ambulatory Visit (INDEPENDENT_AMBULATORY_CARE_PROVIDER_SITE_OTHER): Payer: BLUE CROSS/BLUE SHIELD

## 2015-06-08 DIAGNOSIS — R7989 Other specified abnormal findings of blood chemistry: Secondary | ICD-10-CM | POA: Diagnosis not present

## 2015-06-08 DIAGNOSIS — R945 Abnormal results of liver function studies: Principal | ICD-10-CM

## 2015-06-08 LAB — HEPATIC FUNCTION PANEL
ALT: 127 U/L — ABNORMAL HIGH (ref 0–35)
AST: 82 U/L — ABNORMAL HIGH (ref 0–37)
Albumin: 4.5 g/dL (ref 3.5–5.2)
Alkaline Phosphatase: 73 U/L (ref 39–117)
BILIRUBIN TOTAL: 0.3 mg/dL (ref 0.2–1.2)
Bilirubin, Direct: 0.1 mg/dL (ref 0.0–0.3)
Total Protein: 7.3 g/dL (ref 6.0–8.3)

## 2015-06-13 ENCOUNTER — Other Ambulatory Visit: Payer: Self-pay

## 2015-06-13 DIAGNOSIS — R945 Abnormal results of liver function studies: Principal | ICD-10-CM

## 2015-06-13 DIAGNOSIS — R7989 Other specified abnormal findings of blood chemistry: Secondary | ICD-10-CM

## 2015-06-30 ENCOUNTER — Encounter: Payer: Self-pay | Admitting: Family Medicine

## 2015-08-13 ENCOUNTER — Other Ambulatory Visit (INDEPENDENT_AMBULATORY_CARE_PROVIDER_SITE_OTHER): Payer: BLUE CROSS/BLUE SHIELD

## 2015-08-13 DIAGNOSIS — R7989 Other specified abnormal findings of blood chemistry: Secondary | ICD-10-CM

## 2015-08-13 DIAGNOSIS — R945 Abnormal results of liver function studies: Principal | ICD-10-CM

## 2015-08-13 LAB — HEPATIC FUNCTION PANEL
ALBUMIN: 4.2 g/dL (ref 3.5–5.2)
ALT: 37 U/L — ABNORMAL HIGH (ref 0–35)
AST: 33 U/L (ref 0–37)
Alkaline Phosphatase: 57 U/L (ref 39–117)
Bilirubin, Direct: 0.1 mg/dL (ref 0.0–0.3)
Total Bilirubin: 0.3 mg/dL (ref 0.2–1.2)
Total Protein: 7 g/dL (ref 6.0–8.3)

## 2015-08-14 ENCOUNTER — Telehealth: Payer: Self-pay | Admitting: Gastroenterology

## 2015-08-14 NOTE — Telephone Encounter (Signed)
Pt aware to keep appt as scheduled

## 2015-08-15 ENCOUNTER — Ambulatory Visit (INDEPENDENT_AMBULATORY_CARE_PROVIDER_SITE_OTHER): Payer: BLUE CROSS/BLUE SHIELD | Admitting: Gastroenterology

## 2015-08-15 ENCOUNTER — Encounter: Payer: Self-pay | Admitting: Gastroenterology

## 2015-08-15 VITALS — BP 90/60 | HR 62 | Ht 60.0 in | Wt 105.5 lb

## 2015-08-15 DIAGNOSIS — R945 Abnormal results of liver function studies: Principal | ICD-10-CM

## 2015-08-15 DIAGNOSIS — R7989 Other specified abnormal findings of blood chemistry: Secondary | ICD-10-CM

## 2015-08-15 NOTE — Progress Notes (Signed)
Review of pertinent gastrointestinal problems: 1. Elevated liver tests (chronic, at least for 5-10 years by time of formal evaluation 08/22/2014): imaging [place] and MRI 08/22/2014 were both essentially normal: Labs 2016/17: Hepatitis C antibody negative , ceruloplasmin normal, alpha-1 antitrypsin level normal , ANA negative, AMA negative, Iron studies normal Liver tests since 21-Aug-2009; she is immune to hepatitis B, anti-smooth muscle antibody negative, celiac sprue testing negative, hepatitis A serology not immune, INR normal: AST and ALT have been intermittently elevated dating back at least to 2009/08/21 and they were 40 and 70 respectively. Most recently ALT and AST in the 1-200 range. The remaining liver tests have all been normal persistently.  LFTs have trended to nearly normal by August 22, 2015  HPI: This is a very pleasant 55 year old woman whom I last saw about 3 months ago at her initial evaluation.  Chief complaint is elevated liver tests  She has felt completely fine. She has no liver like issues of jaundice, pruritus, significant abdominal pains.     Past Medical History  Diagnosis Date  . CANDIDIASIS, VAGINAL 01/08/2010  . DEPRESSION 08/09/2009  . TRANSAMINASES, SERUM, ELEVATED 08/14/2009  . Anorexia nervosa teen  . Anemia   . Insomnia   . STD (sexually transmitted disease)     HSV2?    Past Surgical History  Procedure Laterality Date  . Cesarean section      X 3  . Dilitation & currettage/hystroscopy with hydrothermal ablation    . Breast enhancement surgery Bilateral   . Hysteroscopy  12/27/07    and HTA  . Endometrial ablation  12/27/07    Current Outpatient Prescriptions  Medication Sig Dispense Refill  . buPROPion (WELLBUTRIN) 100 MG tablet Take 1.5 tablets by mouth daily.    . clonazePAM (KLONOPIN) 0.5 MG tablet Take 0.5 mg by mouth 2 (two) times daily as needed for anxiety.    . Estradiol 10 MCG TABS vaginal tablet Place 1 tablet (10 mcg total) vaginally 2 (two) times a week. 24  tablet 3  . ibuprofen (ADVIL,MOTRIN) 200 MG tablet Take 200 mg by mouth every 6 (six) hours as needed.    . Vortioxetine HBr (BRINTELLIX) 10 MG TABS Take 1 tablet by mouth daily.     No current facility-administered medications for this visit.    Allergies as of 08/15/2015 - Review Complete 08/15/2015  Allergen Reaction Noted  . Ciprofloxacin  08/09/2009  . Oxycodone-acetaminophen  08/09/2009    Family History  Problem Relation Age of Onset  . Hypertension Mother   . Colon cancer Mother   . Heart disease Father     CABG62  . Stroke Father   . Heart disease Paternal Grandfather     Social History   Social History  . Marital Status: Married    Spouse Name: [name]  . Number of Children: 3  . Years of Education: Master's   Occupational History  . Nurse    Social History Main Topics  . Smoking status: Former Smoker -- 0.50 packs/day for 5 years    Types: Cigarettes    Quit date: 07/10/1987  . Smokeless tobacco: Never Used  . Alcohol Use: No  . Drug Use: No  . Sexual Activity:    Partners: Male    Birth Control/ Protection: Post-menopausal, Surgical     Comment: vasectomy   Other Topics Concern  . Not on file   Social History Narrative   Patient is married [name]) and lives at home with her husband and two children.  Patient has three children.   Patient works at [employer].   Patient has a Master's degree.   Patient is left-handed.   Patient drinks 6 cups of coffee daily.     Physical Exam: BP 90/60 mmHg  Pulse 62  Ht 5' (1.524 m)  Wt 105 lb 8 oz (47.854 kg)  BMI 20.60 kg/m2  LMP 12/27/2007 (Exact Date) Constitutional: generally well-appearing Psychiatric: alert and oriented x3 Abdomen: soft, nontender, nondistended, no obvious ascites, no peritoneal signs, normal bowel sounds   Assessment and plan: 55 y.o. female with Elevated liver tests  These are of unclear etiology but the usual battery of blood tests and imaging studies were  all essentially normal. Her liver tests have been slightly elevated for at least 6-10 years. She has no symptoms of liver disease or biliary process. Most recent set of LFTs show only single transaminase very mild elevation, 2 units above normal. I explained to her that I think we should simply follow her liver tests over time. The next set will be in 4 months. If she ever has persistent, significant elevation (transaminases in the high double digits or triple digits) then she may warrant liver biopsy for further evaluation. I reviewed her meds list with her, she is on no overtly hepatotoxic medicines   Owens Loffler, MD [facility] 08/15/2015, 8:50 AM

## 2015-08-15 NOTE — Patient Instructions (Signed)
Repeat LFTs in 4 months. Call if any questions or concerns before then.

## 2015-10-11 ENCOUNTER — Other Ambulatory Visit: Payer: Self-pay | Admitting: Nurse Practitioner

## 2015-10-11 NOTE — Telephone Encounter (Signed)
Medication refill request: YUVAFEM 10MCG Last AEX:  10/26/14 PG Next AEX: 11/12/15  Last MMG (if hormonal medication request): 08/29/14 BIRADS1 negative Refill authorized: 10/26/14 #24 w/3 refills today #24 w/0 refills? Please advise

## 2015-10-11 NOTE — Telephone Encounter (Signed)
Needs mammogram will do one refill only

## 2015-10-11 NOTE — Telephone Encounter (Signed)
Medication refill request: Yuvafem 4mcg Last AEX:  10/26/14 PG Next AEX:11/12/15 Last MMG (if hormonal medication request): 08/29/14 BIRADS1 Refill authorized: Yuvafem 14mcg #24 3R. Please advise. Thank you.

## 2015-10-12 ENCOUNTER — Telehealth: Payer: Self-pay

## 2015-10-12 NOTE — Telephone Encounter (Signed)
Called patient to inform that she needs to schedule mammogram. No answer, left message for patient to call me back.

## 2015-10-12 NOTE — Telephone Encounter (Signed)
Tried calling patient, no answer, left message to call back

## 2015-10-15 NOTE — Telephone Encounter (Signed)
Patient is returning a call to [facility]. °

## 2015-10-16 NOTE — Telephone Encounter (Signed)
Spoke with patient regarding scheduling mammogram. Closing this note.

## 2015-10-16 NOTE — Telephone Encounter (Signed)
Spoke with patient and let her know she needed to schedule mammogram. Closing this encounter

## 2015-10-25 ENCOUNTER — Other Ambulatory Visit: Payer: Self-pay | Admitting: Nurse Practitioner

## 2015-10-25 DIAGNOSIS — Z1231 Encounter for screening mammogram for malignant neoplasm of breast: Secondary | ICD-10-CM

## 2015-11-09 ENCOUNTER — Ambulatory Visit
Admission: RE | Admit: 2015-11-09 | Discharge: 2015-11-09 | Disposition: A | Discharge status: Home / Self Care | Payer: BLUE CROSS/BLUE SHIELD | Source: Ambulatory Visit | Attending: Nurse Practitioner | Admitting: Nurse Practitioner

## 2015-11-09 DIAGNOSIS — Z1231 Encounter for screening mammogram for malignant neoplasm of breast: Secondary | ICD-10-CM

## 2015-11-12 ENCOUNTER — Encounter: Payer: Self-pay | Admitting: Nurse Practitioner

## 2015-11-12 ENCOUNTER — Ambulatory Visit (INDEPENDENT_AMBULATORY_CARE_PROVIDER_SITE_OTHER): Payer: BLUE CROSS/BLUE SHIELD | Admitting: Nurse Practitioner

## 2015-11-12 VITALS — BP 108/66 | HR 76 | Ht 60.0 in | Wt 106.0 lb

## 2015-11-12 DIAGNOSIS — N952 Postmenopausal atrophic vaginitis: Secondary | ICD-10-CM

## 2015-11-12 DIAGNOSIS — Z01419 Encounter for gynecological examination (general) (routine) without abnormal findings: Secondary | ICD-10-CM

## 2015-11-12 DIAGNOSIS — A609 Anogenital herpesviral infection, unspecified: Secondary | ICD-10-CM

## 2015-11-12 DIAGNOSIS — Z0000 Encounter for general adult medical examination without abnormal findings: Secondary | ICD-10-CM | POA: Diagnosis not present

## 2015-11-12 LAB — POCT URINALYSIS DIPSTICK
Bilirubin, UA: NEGATIVE
Glucose, UA: NEGATIVE
KETONES UA: NEGATIVE
LEUKOCYTES UA: NEGATIVE
Nitrite, UA: NEGATIVE
PH UA: 6.5
PROTEIN UA: NEGATIVE
RBC UA: NEGATIVE
Urobilinogen, UA: NEGATIVE

## 2015-11-12 MED ORDER — ESTRADIOL 10 MCG VA TABS: ORAL_TABLET | VAGINAL | Status: DC

## 2015-11-12 NOTE — Progress Notes (Signed)
Reviewed personally.  M. Suzanne Faithlynn Deeley, MD.  

## 2015-11-12 NOTE — Progress Notes (Signed)
Patient ID: Heather Davila, female   DOB: 10/29/1960, 55 y.o.   MRN: [id number]  55 y.o. [id number] Married  Caucasian Fe here for annual exam.  She is followed by Dr. Ardis Hughs for abnormal LFT's and is due for a recheck in August.  She feels well without other health issues. Using Vagifem 1-2 X a week.  Patient's last menstrual period was 12/27/2007 (exact date).          Sexually active: Yes.    The current method of family planning is vasectomy and post menopausal status.    Exercising: Yes.   Walking almost everyday. Smoker:  no  Health Maintenance: Pap:  05/31/12, Negative with neg HR HPV MMG:  11/09/15, Bi-Rads 1:  negative,  Colonoscopy:  10/17/11, poor prep, redo (I spoke with Dr. Lorie Apley office 7/13, she has not had repeat and they will call her to schedule)  Will do with Dr. Ardis Hughs BMD:  Never  TDaP:  2013 ? - will verify with PCP/ health dept Hep C: 05/04/14 HIV: 1997 with pregnancy Labs: HB: 04/25/15=14.6   Urine: Negative     reports that she quit smoking about 28 years ago. Her smoking use included Cigarettes. She has a 2.5 pack-year smoking history. She has never used smokeless tobacco. She reports that she does not drink alcohol or use illicit drugs.  Past Medical History  Diagnosis Date  . DEPRESSION 08/09/2009  . TRANSAMINASES, SERUM, ELEVATED 08/14/2009  . Anorexia nervosa teen  . Anemia   . Insomnia   . STD (sexually transmitted disease) 08/05/2013    HSV2?, 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    Past Surgical History  Procedure Laterality Date  . Cesarean section      X 3  . Dilitation & currettage/hystroscopy with hydrothermal ablation    . Breast enhancement surgery Bilateral   . Hysteroscopy  12/27/07    and HTA  . Endometrial ablation  12/27/07    Current Outpatient Prescriptions  Medication Sig Dispense Refill  . buPROPion (WELLBUTRIN) 100 MG tablet Take 1.5 tablets by mouth daily.    . clonazePAM (KLONOPIN) 0.5 MG tablet Take 0.5 mg by mouth 2 (two) times daily as  needed for anxiety.    . Estradiol (YUVAFEM) 10 MCG TABS vaginal tablet INSERT 1 TABLET VAGINALLY TWICE WEEKLY 24 tablet 4  . ibuprofen (ADVIL,MOTRIN) 200 MG tablet Take 200 mg by mouth every 6 (six) hours as needed.    . Vortioxetine HBr (BRINTELLIX) 10 MG TABS Take 1 tablet by mouth daily.     No current facility-administered medications for this visit.    Family History  Problem Relation Age of Onset  . Hypertension Mother   . Colon cancer Mother 70    colon rescection  . Heart disease Father 44    CABG62  . Stroke Father 18  . Heart disease Paternal Grandfather   . Heart attack Sister 85    no stents  . Heart failure Paternal Aunt   . Diabetes Paternal Uncle     ROS:  Pertinent items are noted in HPI.  Otherwise, a comprehensive ROS was negative.  Exam:   BP 108/66 mmHg  Pulse 76  Ht 5' (1.524 m)  Wt 106 lb (48.081 kg)  BMI 20.70 kg/m2  LMP 12/27/2007 (Exact Date) Height: 5' (152.4 cm) Ht Readings from Last 3 Encounters:  11/12/15 5' (1.524 m)  08/15/15 5' (1.524 m)  04/25/15 5\' 3"  (1.6 m)    General appearance: alert, cooperative and appears  stated age Head: Normocephalic, without obvious abnormality, atraumatic Neck: no adenopathy, supple, symmetrical, trachea midline and thyroid normal to inspection and palpation Lungs: clear to auscultation bilaterally Breasts: normal appearance, no masses or tenderness, positive findings: implants bilaterally Heart: regular rate and rhythm Abdomen: soft, non-tender; no masses,  no organomegaly Extremities: extremities normal, atraumatic, no cyanosis or edema Skin: Skin color, texture, turgor normal. No rashes or lesions Lymph nodes: Cervical, supraclavicular, and axillary nodes normal. No abnormal inguinal nodes palpated Neurologic: Grossly normal   Pelvic: External genitalia:  no lesions              Urethra:  normal appearing urethra with no masses, tenderness or lesions              Bartholin's and Skene's: normal                  Vagina: slight atrophic appearing vagina with normal color and discharge, no lesions              Cervix: anteverted              Pap taken: Yes.   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  [place]: mother with colon cancer  History of depression - doing well on med's   P:   Reviewed health and wellness pertinent to exam  Pap smear as above  Mammogram is due 10/2016  Will follow with pap  She is encouraged to get colonoscopy with new GI - Dr. Ardis Hughs.  Refill on Vagifem for a year  Counseled on risk of DVT, CVA, cancer, etc.  Does not need a refill on Valtrex at this time -will CB  Counseled on breast self exam, mammography screening, adequate intake of calcium and vitamin D, diet and exercise, Kegel's exercises return annually or prn  An After Visit Summary was printed and given to the patient.

## 2015-11-12 NOTE — Patient Instructions (Addendum)

## 2015-11-14 LAB — IPS PAP TEST WITH HPV

## 2015-12-11 ENCOUNTER — Other Ambulatory Visit (INDEPENDENT_AMBULATORY_CARE_PROVIDER_SITE_OTHER): Payer: BLUE CROSS/BLUE SHIELD

## 2015-12-11 DIAGNOSIS — R7989 Other specified abnormal findings of blood chemistry: Secondary | ICD-10-CM | POA: Diagnosis not present

## 2015-12-11 DIAGNOSIS — R945 Abnormal results of liver function studies: Principal | ICD-10-CM

## 2015-12-11 LAB — HEPATIC FUNCTION PANEL
ALT: 83 U/L — ABNORMAL HIGH (ref 0–35)
AST: 62 U/L — AB (ref 0–37)
Albumin: 4.7 g/dL (ref 3.5–5.2)
Alkaline Phosphatase: 60 U/L (ref 39–117)
BILIRUBIN TOTAL: 0.4 mg/dL (ref 0.2–1.2)
Bilirubin, Direct: 0.1 mg/dL (ref 0.0–0.3)
Total Protein: 7.4 g/dL (ref 6.0–8.3)

## 2015-12-14 ENCOUNTER — Other Ambulatory Visit: Payer: Self-pay

## 2015-12-14 DIAGNOSIS — R945 Abnormal results of liver function studies: Principal | ICD-10-CM

## 2015-12-14 DIAGNOSIS — R7989 Other specified abnormal findings of blood chemistry: Secondary | ICD-10-CM

## 2016-02-24 ENCOUNTER — Other Ambulatory Visit: Payer: Self-pay | Admitting: Nurse Practitioner

## 2016-02-25 NOTE — Telephone Encounter (Signed)
Medication refill request: Valtrex  Last AEX:  11-12-15  Next AEX: not scheduled  Last MMG (if hormonal medication request): 11-12-15 WNL  Refill authorized: please advise

## 2016-09-02 ENCOUNTER — Other Ambulatory Visit: Payer: Self-pay

## 2016-09-02 MED ORDER — ESTRADIOL 10 MCG VA TABS: ORAL_TABLET | VAGINAL | 0 refills | Status: DC

## 2016-09-02 NOTE — Telephone Encounter (Signed)
Medication refill request: Estradiol Merril Abbe) Last AEX:  11/12/15 PG Next AEX: 12/15/16 PG Last MMG (if hormonal medication request): 11/09/15 BIRADS1, [facility]. Refill authorized: 10/1715 #24 4R. Please advise. Thank you.   Routing to DL since PG is out of the office.

## 2016-10-01 IMAGING — MR MR 3D RECON AT SCANNER
20 of 27 series · 20 of 27 positions shown · IV contrast (multihance)
Comparison: No priors.  Ultrasound of the abdomen 11/10/2014.

CLINICAL DATA: 54-year-old female with elevated liver enzymes for
the past 10 years. Abnormal ultrasound examination. Followup study.

EXAM:
MRI ABDOMEN WITHOUT AND WITH CONTRAST (INCLUDING MRCP)
TECHNIQUE: Multiplanar multisequence MR imaging of the abdomen was performed
both before and after the administration of intravenous contrast.
Heavily T2-weighted images of the biliary and pancreatic ducts were
obtained, and three-dimensional MRCP images were rendered by post
processing.
CONTRAST:  10mL MULTIHANCE GADOBENATE DIMEGLUMINE 529 MG/ML IV SOLN

[Series 3: T2 fat-sat · axial · 5.0mm · 0.78mm/px · 1 of 50 slices shown]
[im 1/50]
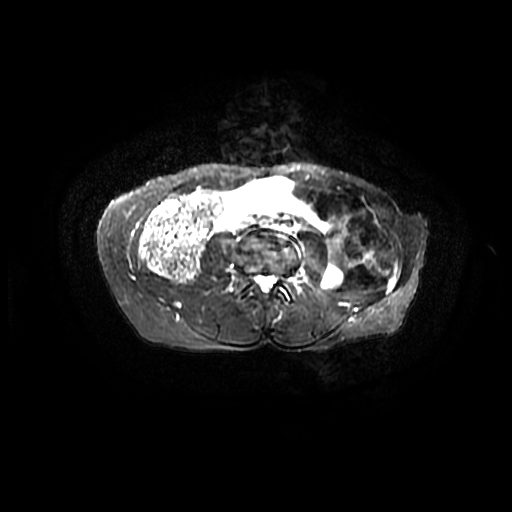

[Series 5: MRCP · coronal · 2.0mm · 0.70mm/px · 1 of 45 slices shown]
[im 1/45]
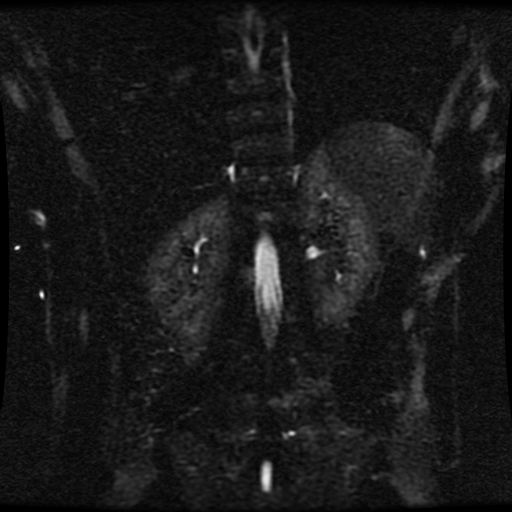

[Series 6: DWI b500 · axial · 6.0mm · 1.48mm/px · 1 of 72 slices shown]
[im 1/72]
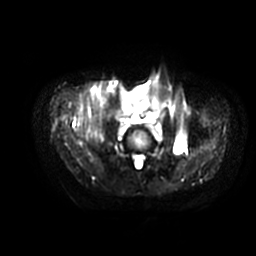

[Series 7: ax dualecho · axial · 5.0mm · 0.78mm/px · 1 of 106 slices shown]
[im 1/106]
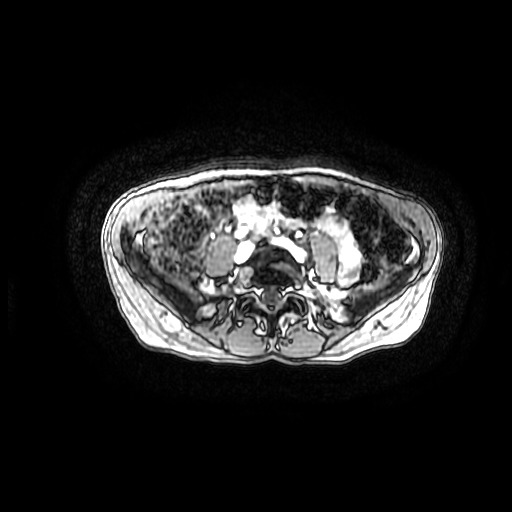

[Series 9: bSSFP fat-sat · coronal · 5.0mm · 0.70mm/px · 1 of 34 slices shown]
[im 1/34]
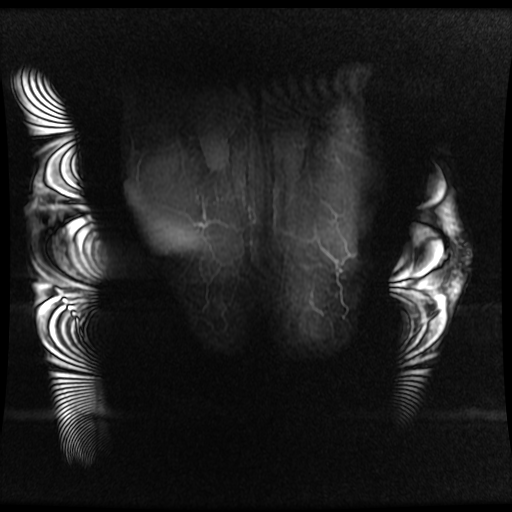

[Series 10: T2 · axial · 5.0mm · 0.78mm/px · 1 of 53 slices shown]
[im 1/53]
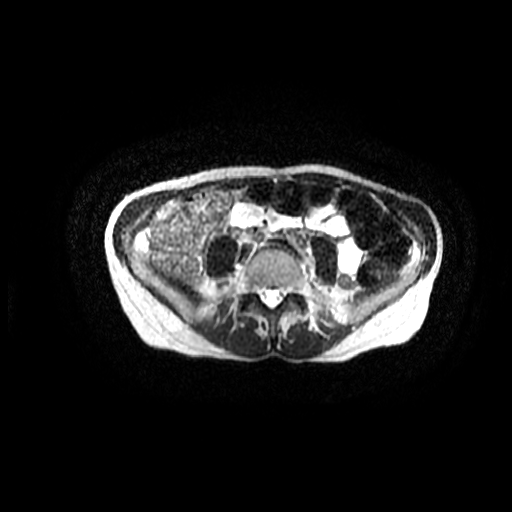

[Series 12: T1 dynamic · coronal · delayed · 4.0mm · 0.78mm/px · 1 of 88 slices shown (1 of 7)]
[im 1/88]
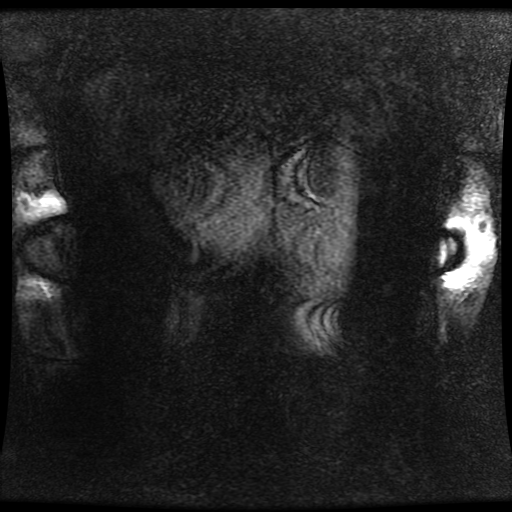

[Series 401: reformatted · coronal · 100.0mm · 0.51mm/px · 1 of 180 slices shown]
[im 1/180]
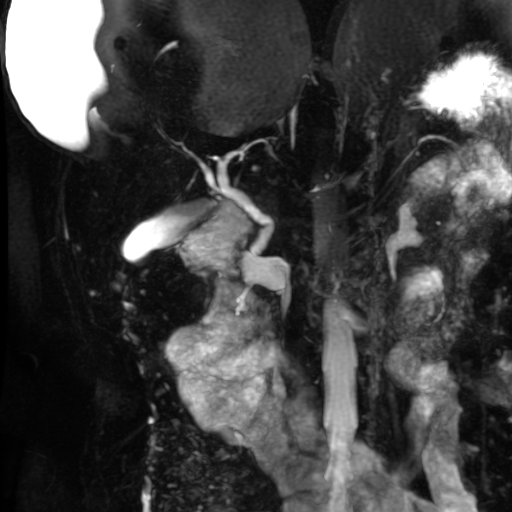

[Series 600: DWI · axial · 6.0mm · 1.48mm/px · 1 of 36 slices shown]
[im 1/36]
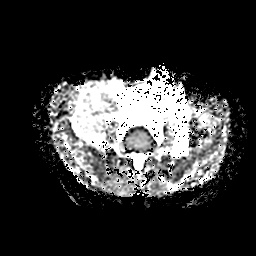

[Series 1100: T1 dynamic · axial · 5.0mm · 0.78mm/px · 1 of 96 slices shown (2 of 7)]
[im 1/96]
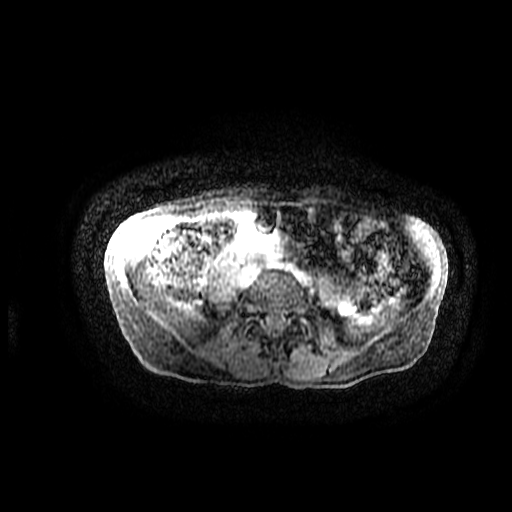

[Series 1102: T1 dynamic · axial · 5.0mm · 0.78mm/px · 1 of 96 slices shown (3 of 7)]
[im 1/96]
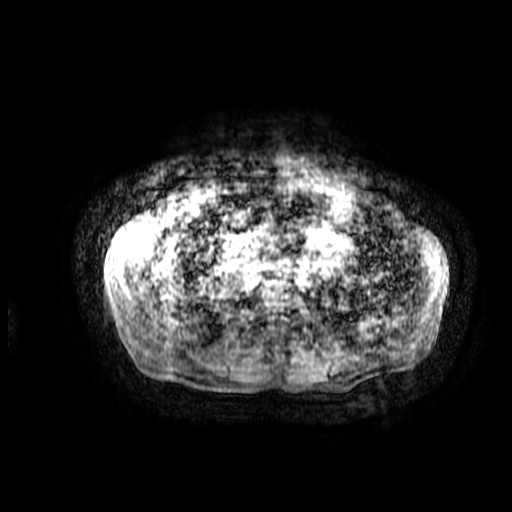

[Series 1103: T1 dynamic · axial · 5.0mm · 0.78mm/px · 1 of 96 slices shown (4 of 7)]
[im 1/96]
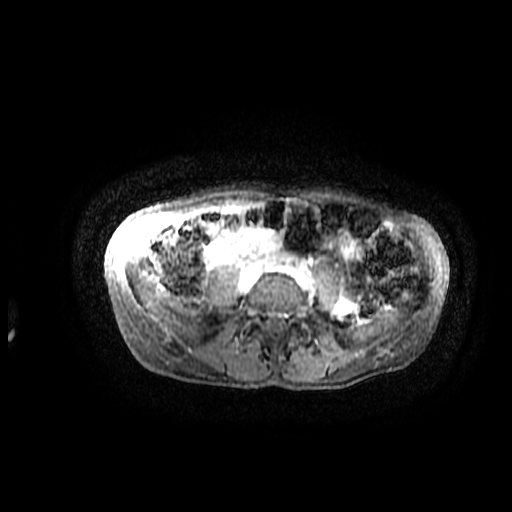

[Series 1104: T1 dynamic · axial · 5.0mm · 0.78mm/px · 1 of 96 slices shown (5 of 7)]
[im 1/96]
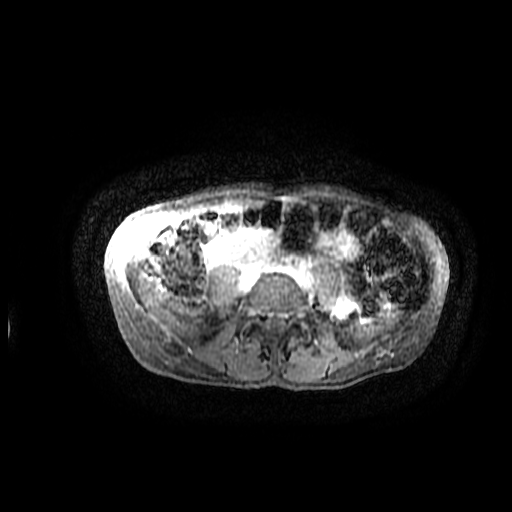

[Series 1106: T1 dynamic · axial · 5.0mm · 0.78mm/px · 1 of 96 slices shown (6 of 7)]
[im 1/96]
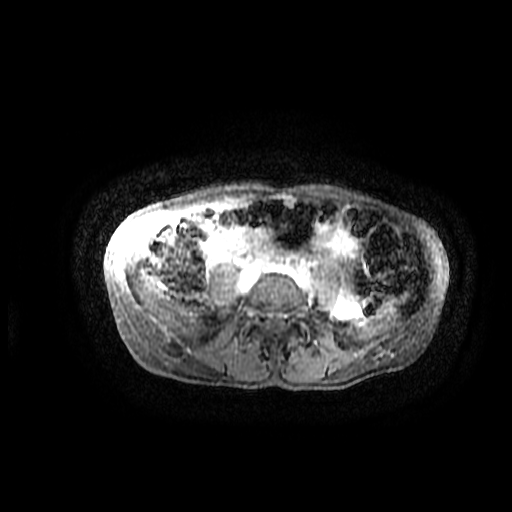

[Series 1107: T1 dynamic · axial · 5.0mm · 0.78mm/px · 1 of 96 slices shown (7 of 7)]
[im 1/96]
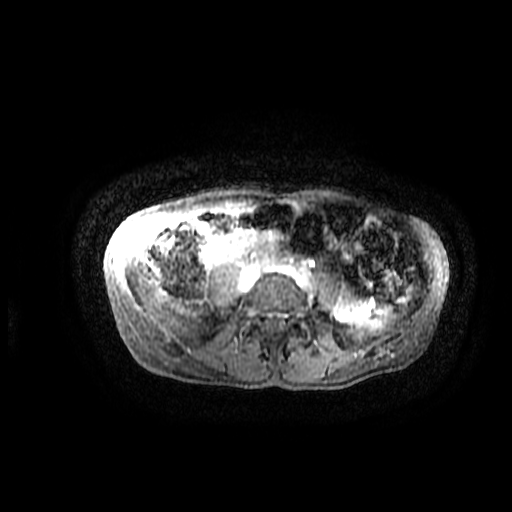

[((id)/(id)/1)-((id)/(id)/1) · axial · 5.0mm · 0.78mm/px · 1 of 95 slices shown (1 of 5)]
[im 1/95]
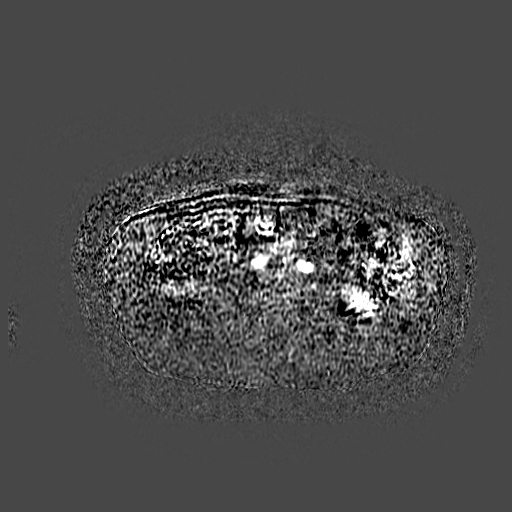

[((id)/(id)/1)-((id)/(id)/1) · axial · 5.0mm · 0.78mm/px · 1 of 96 slices shown (2 of 5)]
[im 1/96]
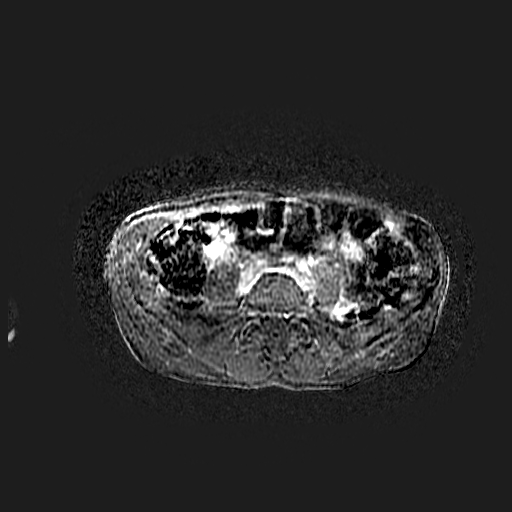

[((id)/(id)/1)-((id)/(id)/1) · axial · 5.0mm · 0.78mm/px · 1 of 96 slices shown (3 of 5)]
[im 1/96]
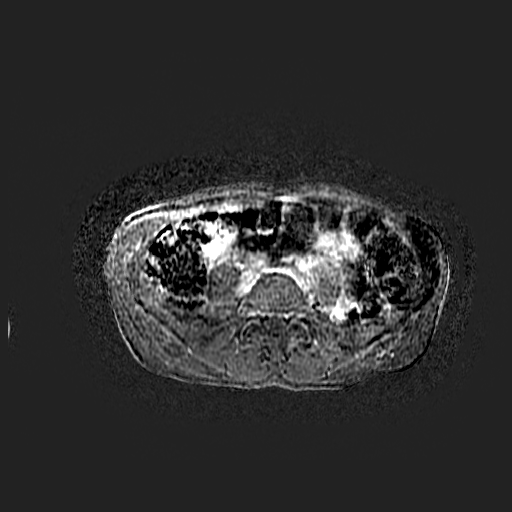

[((id)/(id)/1)-((id)/(id)/1) · axial · 5.0mm · 0.78mm/px · 1 of 96 slices shown (4 of 5)]
[im 1/96]
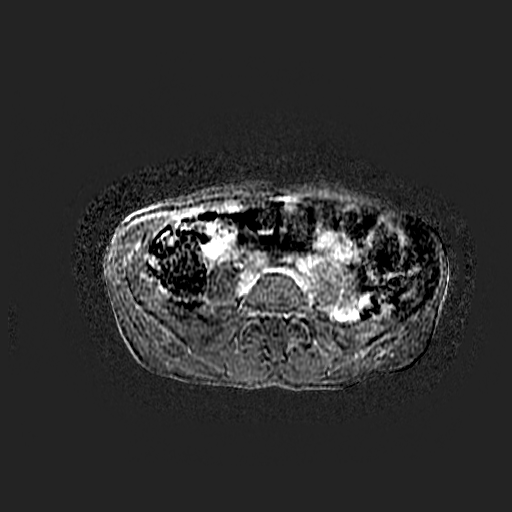

[((id)/(id)/1)-((id)/(id)/1) · axial · 5.0mm · 0.78mm/px · 1 of 96 slices shown (5 of 5)]
[im 1/96]
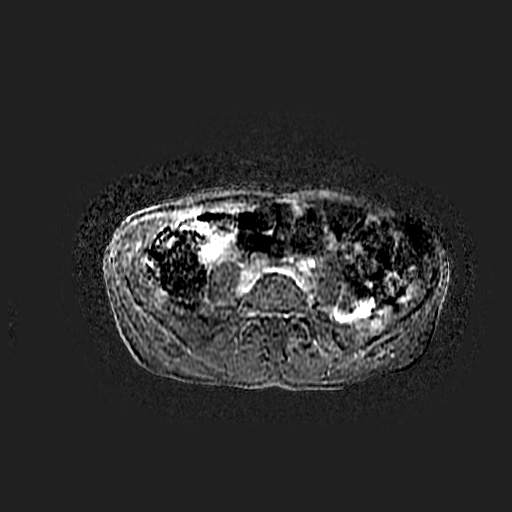

[20 of 27 positions shown; findings below may reference images not displayed]

FINDINGS: Comment: Per report from the technologist the patient began sneezing
after gadolinium administration, and the scan was momentarily
stopped, then later re-initiated.

Lower chest:  Bilateral breast implants are incidentally noted.

Hepatobiliary: No cystic or solid hepatic lesions. MRCP images
demonstrate no intra or extrahepatic biliary ductal dilatation.
Common bile duct measures 6 mm in the porta hepatis. No filling
defects in the gallbladder or common bile duct to suggest either
cholelithiasis or choledocholithiasis.

Pancreas: No pancreatic mass. No pancreatic ductal dilatation on
MRCP images. No pancreatic or peripancreatic fluid or inflammatory
changes.

Spleen: Unremarkable.

Adrenals/Urinary Tract: Bilateral kidneys and bilateral adrenal
glands are normal in appearance. No hydroureteronephrosis in the
visualized abdomen.

Stomach/Bowel: Visualized portions are unremarkable.

Vascular/Lymphatic: No aneurysm identified in the visualized
abdominal vasculature. No lymphadenopathy noted in the abdomen.

Other: No significant volume of ascites in the visualized peritoneal
cavity.

Musculoskeletal: No aggressive osseous lesions are noted in the
visualized portions of the skeleton.
IMPRESSION: 1. No explanation for the patient's elevated liver function tests.
Specifically, no evidence of biliary tract obstruction.
2. Incidental findings, as above.

## 2016-10-27 ENCOUNTER — Telehealth: Payer: Self-pay | Admitting: Certified Nurse Midwife

## 2016-10-27 NOTE — Telephone Encounter (Signed)
Patient would like a prescription for her mammogram mail to her so she can get done before coming in for aex in August.

## 2016-10-27 NOTE — Telephone Encounter (Signed)
Spoke with patient. Patient states she is planning to have screening MMG in [place] and needs an order, requesting written order to be mailed. Advised patient would review with Melvia Heaps, CNM when she returns to the office on 10/28/16 and return call. Verified mailing address on file.   Written order to Melvia Heaps, CNM for signature.

## 2016-10-28 NOTE — Telephone Encounter (Signed)
Call to patient. Advised written order for MMG placed in outgoing mail. Patient aware will need to call and schedule MMG. Patient verbalizes understanding and is agreeable.  Copy of written order to scan.  Routing to provider for final review. Patient is agreeable to disposition. Will close encounter.

## 2016-12-12 ENCOUNTER — Ambulatory Visit: Payer: BLUE CROSS/BLUE SHIELD | Admitting: Certified Nurse Midwife

## 2016-12-15 ENCOUNTER — Ambulatory Visit: Payer: BLUE CROSS/BLUE SHIELD | Admitting: Nurse Practitioner

## 2016-12-16 ENCOUNTER — Ambulatory Visit: Payer: BLUE CROSS/BLUE SHIELD | Admitting: Certified Nurse Midwife

## 2017-01-15 ENCOUNTER — Encounter: Payer: Self-pay | Admitting: Family Medicine

## 2017-08-12 DIAGNOSIS — F909 Attention-deficit hyperactivity disorder, unspecified type: Secondary | ICD-10-CM | POA: Insufficient documentation

## 2017-08-14 DIAGNOSIS — F520 Hypoactive sexual desire disorder: Secondary | ICD-10-CM | POA: Insufficient documentation

## 2017-08-14 DIAGNOSIS — N951 Menopausal and female climacteric states: Secondary | ICD-10-CM | POA: Insufficient documentation

## 2017-09-03 DIAGNOSIS — E079 Disorder of thyroid, unspecified: Secondary | ICD-10-CM | POA: Insufficient documentation

## 2017-09-03 DIAGNOSIS — L659 Nonscarring hair loss, unspecified: Secondary | ICD-10-CM | POA: Insufficient documentation

## 2017-09-03 DIAGNOSIS — E237 Disorder of pituitary gland, unspecified: Secondary | ICD-10-CM | POA: Insufficient documentation

## 2018-03-05 ENCOUNTER — Ambulatory Visit (INDEPENDENT_AMBULATORY_CARE_PROVIDER_SITE_OTHER): Payer: BLUE CROSS/BLUE SHIELD | Admitting: Certified Nurse Midwife

## 2018-03-05 ENCOUNTER — Encounter: Payer: Self-pay | Admitting: Certified Nurse Midwife

## 2018-03-05 ENCOUNTER — Other Ambulatory Visit: Payer: Self-pay

## 2018-03-05 VITALS — BP 102/64 | HR 68 | Resp 16 | Wt 109.0 lb

## 2018-03-05 DIAGNOSIS — N951 Menopausal and female climacteric states: Secondary | ICD-10-CM

## 2018-03-05 DIAGNOSIS — Z0000 Encounter for general adult medical examination without abnormal findings: Secondary | ICD-10-CM

## 2018-03-05 DIAGNOSIS — Z01419 Encounter for gynecological examination (general) (routine) without abnormal findings: Secondary | ICD-10-CM

## 2018-03-05 DIAGNOSIS — Z23 Encounter for immunization: Secondary | ICD-10-CM

## 2018-03-05 NOTE — Progress Notes (Signed)
Review of Systems  Constitutional: Negative.   HENT: Negative.   Eyes: Negative.   Respiratory: Negative.   Cardiovascular: Negative.   Gastrointestinal: Negative.   Genitourinary: Negative.   Musculoskeletal: Negative.   Skin: Negative.   Neurological: Negative.   Endo/Heme/Allergies: Negative.   Psychiatric/Behavioral:       Sleep changes with hot flashes      57 yo white married female here for consult regarding menopausal symptoms. Patient has been living in [place] and had aex with pap smear(normal per patient) and mammogram while  There. (brought copies of results) normal. Her Gyn there started her on a hormonal cream, because she was told she did not have any hormones with lab testing. Feels this has made the symptoms increase. She was also given Trazadone for insomnia used but for a very short period time. Did not help and she did not like how she felt with use. Denies vaginal bleeding. She returns here to seek recommendation regarding HRT use and expectations with menopause. Patient feels all this changed her and stopped both. Vaginal dryness had increased so continued  with Yuvafem twice weekly with good results. Has aex with PCP and all normal per patient. No other concerns today. She will be here in 10/2018 permanently and will do next exam here. Would like breast exam and pelvic exam today, just for reassurance.  Physical Exam  Constitutional: She is oriented to person, place, and time. She appears well-developed and well-nourished.  Neck: No thyromegaly present.  Pulmonary/Chest: Right breast exhibits no nipple discharge, no skin change and no tenderness. Left breast exhibits no nipple discharge, no skin change and no tenderness.  No masses noted,   Abdominal: Soft. She exhibits no mass. There is no tenderness. There is no guarding.  Genitourinary: Rectum normal, vagina normal and uterus normal. Pelvic exam was performed with patient supine. There is no rash, tenderness, lesion or  injury on the right labia. There is no rash, tenderness, lesion or injury on the left labia. Uterus is not tender. Cervix exhibits no motion tenderness and no discharge. Right adnexum displays no mass, no tenderness and no fullness. Left adnexum displays no mass, no tenderness and no fullness. No tenderness or bleeding in the vagina. No vaginal discharge found.  Genitourinary Comments: Vaginal moist, no atrophy  Lymphadenopathy: No inguinal adenopathy noted on the right or left side.  Neurological: She is alert and oriented to person, place, and time.  Skin: Skin is warm and dry.  Psychiatric: She has a normal mood and affect. Her speech is normal and behavior is normal. Judgment and thought content normal. Cognition and memory are normal.    A: Normal pelvic and breast exam Menopausal previous HRT cream has stopped Vaginal dryness with Yuvafem use with good results Anxiety/Depression with Prozac use working well Klonipin for increase anxiety if needed. Immunization update  P: Discussed normal finding of pelvic and breast exam.   Discussed no vaginal dryness, noted. Discussed continuing Yuvafem, she has refill Discussed Prozac can help with menopausal symptoms would advise to stay off HRT and see if this works well. Patient feel it is working now Discussed Klonipin use at hs to see if insomnia improves. Questions addressed at length. Feels much better after discussion. Will call if other questions until in [place] permanently again. Requests TDAP  Rv prn  18 minutes in consultation in addition to exam

## 2018-03-05 NOTE — Patient Instructions (Signed)
Perimenopause Perimenopause is the time when your body begins to move into the menopause (no menstrual period for 12 straight months). It is a natural process. Perimenopause can begin 2-8 years before the menopause and usually lasts for 1 year after the menopause. During this time, your ovaries may or may not produce an egg. The ovaries vary in their production of estrogen and progesterone hormones each month. This can cause irregular menstrual periods, difficulty getting pregnant, vaginal bleeding between periods, and uncomfortable symptoms. What are the causes?  Irregular production of the ovarian hormones, estrogen and progesterone, and not ovulating every month. Other causes include:  Tumor of the pituitary gland in the brain.  Medical disease that affects the ovaries.  Radiation treatment.  Chemotherapy.  Unknown causes.  Heavy smoking and excessive alcohol intake can bring on perimenopause sooner. What are the signs or symptoms?  Hot flashes.  Night sweats.  Irregular menstrual periods.  Decreased sex drive.  Vaginal dryness.  Headaches.  Mood swings.  Depression.  Memory problems.  Irritability.  Tiredness.  Weight gain.  Trouble getting pregnant.  The beginning of losing bone cells (osteoporosis).  The beginning of hardening of the arteries (atherosclerosis). How is this diagnosed? Your health care provider will make a diagnosis by analyzing your age, menstrual history, and symptoms. He or she will do a physical exam and note any changes in your body, especially your female organs. Female hormone tests may or may not be helpful depending on the amount of female hormones you produce and when you produce them. However, other hormone tests may be helpful to rule out other problems. How is this treated? In some cases, no treatment is needed. The decision on whether treatment is necessary during the perimenopause should be made by you and your health care  provider based on how the symptoms are affecting you and your lifestyle. Various treatments are available, such as:  Treating individual symptoms with a specific medicine for that symptom.  Herbal medicines that can help specific symptoms.  Counseling.  Group therapy. Follow these instructions at home:  Keep track of your menstrual periods (when they occur, how heavy they are, how long between periods, and how long they last) as well as your symptoms and when they started.  Only take over-the-counter or prescription medicines as directed by your health care provider.  Sleep and rest.  Exercise.  Eat a diet that contains calcium (good for your bones) and soy (acts like the estrogen hormone).  Do not smoke.  Avoid alcoholic beverages.  Take vitamin supplements as recommended by your health care provider. Taking vitamin E may help in certain cases.  Take calcium and vitamin D supplements to help prevent bone loss.  Group therapy is sometimes helpful.  Acupuncture may help in some cases. Contact a health care provider if:  You have questions about any symptoms you are having.  You need a referral to a specialist (gynecologist, psychiatrist, or psychologist). Get help right away if:  You have vaginal bleeding.  Your period lasts longer than 8 days.  Your periods are recurring sooner than 21 days.  You have bleeding after intercourse.  You have severe depression.  You have pain when you urinate.  You have severe headaches.  You have vision problems. This information is not intended to replace advice given to you by your health care provider. Make sure you discuss any questions you have with your health care provider. Document Released: 05/22/2004 Document Revised: 09/20/2015 Document Reviewed: 11/11/2012 Elsevier Interactive   Patient Education  2017 [employer] Inc[employer]  

## 2018-04-19 ENCOUNTER — Encounter: Payer: Self-pay | Admitting: Family Medicine

## 2018-04-19 ENCOUNTER — Ambulatory Visit: Payer: BLUE CROSS/BLUE SHIELD | Admitting: Family Medicine

## 2018-04-19 ENCOUNTER — Other Ambulatory Visit: Payer: Self-pay

## 2018-04-19 VITALS — BP 110/68 | HR 77 | Temp 97.8°F | Ht 60.0 in | Wt 105.1 lb

## 2018-04-19 DIAGNOSIS — K5904 Chronic idiopathic constipation: Secondary | ICD-10-CM | POA: Diagnosis not present

## 2018-04-19 MED ORDER — LUBIPROSTONE 24 MCG PO CAPS
24.0000 ug | ORAL_CAPSULE | Freq: Two times a day (BID) | ORAL | 1 refills | Status: DC
Start: 2018-04-19 — End: 2018-09-09

## 2018-04-19 NOTE — Patient Instructions (Signed)

## 2018-04-19 NOTE — Progress Notes (Signed)
Subjective:     Patient ID: Heather Davila, female   DOB: [birth year], 57 y.o.   MRN: [id number]  HPI Patient is seen with chief complaint of chronic constipation.  She is up in [place] but plans to move back here in the spring.  She plans to set up physical then.  She states that she had thyroid function testing as part of a physical last April which was normal.  She has had longstanding history of constipation.  No diagnosis of IBS.  She has some bloating.  She was scheduled for colonoscopy 2013 and went but had poor prep and colonoscopy was not done.  She is never rescheduled.  Is not any bloody stools.  No appetite or weight changes.  She has tried multiple things including increase fluid intake, regular walking, increase fiber intake, probiotics, stool softeners, MiraLAX, and Ex-Lax without much improvement.  Sometimes goes up to 10 days between bowel movements.  Past Medical History:  Diagnosis Date  . Anemia   . Anorexia nervosa teen  . DEPRESSION 08/09/2009  . Insomnia   . 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  . TRANSAMINASES, SERUM, ELEVATED 08/14/2009   Past Surgical History:  Procedure Laterality Date  . BREAST ENHANCEMENT SURGERY Bilateral   . CESAREAN SECTION     X 3  . DILITATION & CURRETTAGE/HYSTROSCOPY WITH HYDROTHERMAL ABLATION    . ENDOMETRIAL ABLATION  12/27/07  . HYSTEROSCOPY  12/27/07   and HTA    reports that she quit smoking about 30 years ago. Her smoking use included cigarettes. She has a 2.50 pack-year smoking history. She has never used smokeless tobacco. She reports that she does not drink alcohol or use drugs. family history includes Colon cancer (age of onset: 32) in her mother; Diabetes in her paternal uncle; Heart attack (age of onset: 78) in her sister; Heart disease in her paternal grandfather; Heart disease (age of onset: 82) in her father; Heart failure in her paternal aunt; Hypertension in her mother;  Stroke (age of onset: 8) in her father. Allergies  Allergen Reactions  . Ciprofloxacin     REACTION: hives  . Oxycodone-Acetaminophen     REACTION: hives     Review of Systems  Constitutional: Negative for appetite change, chills, fever and unexpected weight change.  Respiratory: Negative for shortness of breath.   Gastrointestinal: Positive for abdominal distention and constipation. Negative for blood in stool, diarrhea, nausea and vomiting.       Objective:   Physical Exam Constitutional:      Appearance: Normal appearance.  Cardiovascular:     Rate and Rhythm: Normal rate and regular rhythm.     Heart sounds: Normal heart sounds.  Pulmonary:     Effort: Pulmonary effort is normal.     Breath sounds: Normal breath sounds.  Abdominal:     General: Bowel sounds are normal.     Palpations: Abdomen is soft. There is no mass.     Tenderness: There is no abdominal tenderness. There is no guarding or rebound.  Neurological:     Mental Status: She is alert.        Assessment:     Chronic idiopathic constipation.  Patient has tried multiple conservative things as above without improvement.  She has had recent TSH which was normal.  Does not take any anticholinergic medications.    Plan:     -We recommend a trial of amities 24 mcg twice daily and  reviewed potential side effects -Patient needs repeat colonoscopy.  She would like to wait until she moved back here in April to get this set up -Continue conservative measures with increased fluids, fiber, exercise -Touch base if not improving over the next couple weeks  Eulas Post MD [facility]

## 2018-04-27 ENCOUNTER — Ambulatory Visit: Payer: BLUE CROSS/BLUE SHIELD | Admitting: Certified Nurse Midwife

## 2018-08-07 ENCOUNTER — Encounter: Payer: Self-pay | Admitting: Family Medicine

## 2018-08-31 ENCOUNTER — Ambulatory Visit: Payer: BLUE CROSS/BLUE SHIELD | Admitting: Family Medicine

## 2018-08-31 ENCOUNTER — Encounter: Payer: Self-pay | Admitting: Family Medicine

## 2018-08-31 ENCOUNTER — Other Ambulatory Visit: Payer: Self-pay

## 2018-08-31 VITALS — BP 94/58 | HR 88 | Temp 97.9°F | Ht 60.0 in | Wt 108.4 lb

## 2018-08-31 DIAGNOSIS — Z8709 Personal history of other diseases of the respiratory system: Secondary | ICD-10-CM

## 2018-08-31 DIAGNOSIS — R1310 Dysphagia, unspecified: Secondary | ICD-10-CM | POA: Diagnosis not present

## 2018-08-31 DIAGNOSIS — R740 Nonspecific elevation of levels of transaminase and lactic acid dehydrogenase [LDH]: Secondary | ICD-10-CM

## 2018-08-31 DIAGNOSIS — R7401 Elevation of levels of liver transaminase levels: Secondary | ICD-10-CM

## 2018-08-31 DIAGNOSIS — Z87898 Personal history of other specified conditions: Secondary | ICD-10-CM

## 2018-08-31 LAB — CBC WITH DIFFERENTIAL/PLATELET
Basophils Absolute: 0 10*3/uL (ref 0.0–0.1)
Basophils Relative: 1.3 % (ref 0.0–3.0)
Eosinophils Absolute: 0.2 10*3/uL (ref 0.0–0.7)
Eosinophils Relative: 6.4 % — ABNORMAL HIGH (ref 0.0–5.0)
HCT: 42.1 % (ref 36.0–46.0)
Hemoglobin: 14.6 g/dL (ref 12.0–15.0)
Lymphocytes Relative: 32.2 % (ref 12.0–46.0)
Lymphs Abs: 1.2 10*3/uL (ref 0.7–4.0)
MCHC: 34.7 g/dL (ref 30.0–36.0)
MCV: 95.6 fl (ref 78.0–100.0)
Monocytes Absolute: 0.3 10*3/uL (ref 0.1–1.0)
Monocytes Relative: 8.1 % (ref 3.0–12.0)
Neutro Abs: 2 10*3/uL (ref 1.4–7.7)
Neutrophils Relative %: 52 % (ref 43.0–77.0)
Platelets: 230 10*3/uL (ref 150.0–400.0)
RBC: 4.4 Mil/uL (ref 3.87–5.11)
RDW: 12.3 % (ref 11.5–15.5)
WBC: 3.8 10*3/uL — ABNORMAL LOW (ref 4.0–10.5)

## 2018-08-31 LAB — COMPREHENSIVE METABOLIC PANEL
ALT: 32 U/L (ref 0–35)
AST: 27 U/L (ref 0–37)
Albumin: 4.8 g/dL (ref 3.5–5.2)
Alkaline Phosphatase: 75 U/L (ref 39–117)
BUN: 20 mg/dL (ref 6–23)
CO2: 32 mEq/L (ref 19–32)
Calcium: 10 mg/dL (ref 8.4–10.5)
Chloride: 98 mEq/L (ref 96–112)
Creatinine, Ser: 0.59 mg/dL (ref 0.40–1.20)
GFR: 105.29 mL/min (ref >60.00)
Glucose, Bld: 95 mg/dL (ref 70–99)
Potassium: 4.1 mEq/L (ref 3.5–5.1)
Sodium: 137 mEq/L (ref 135–145)
Total Bilirubin: 0.5 mg/dL (ref 0.2–1.2)
Total Protein: 7.6 g/dL (ref 6.0–8.3)

## 2018-08-31 NOTE — Progress Notes (Signed)
Subjective:     Patient ID: Heather Davila, female   DOB: 20-Aug-1960, 58 y.o.   MRN: [id number]  HPI Kiarrah is seen with 1 year history of somewhat progressive solid food dysphagia.  She states she has had problems with swallowing things like carrots and pretzels.  She does not eat a lot of meats.  She is also noted that she frequently has cough when things to feel more obstructed in the sternal notch region of her esophagus.  She has not had any pain with swallowing.  Good appetite no weight loss.  She denies any active GERD symptoms.  Does never had upper endoscopy.  She has chronic constipation which is essentially unchanged.  She does have history of mild elevated liver transaminases.  She has had previous GI work-up with no clear etiology.  No recent labs  Patient relates back in March she had fairly severe dry cough.  She is specifically requesting COVID-19 antibody testing.  Her son and husband worked extensively in [place] and took public transportation there frequently.  They did not have any symptoms but she did have the cough and some mild dyspnea at that time.  Her cough and other respiratory symptoms have pretty much fully resolved.  , Past Medical History:  Diagnosis Date  . Anemia   . Anorexia nervosa teen  . DEPRESSION 08/09/2009  . Insomnia   . 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  . TRANSAMINASES, SERUM, ELEVATED 08/14/2009   Past Surgical History:  Procedure Laterality Date  . BREAST ENHANCEMENT SURGERY Bilateral   . CESAREAN SECTION     X 3  . DILITATION & CURRETTAGE/HYSTROSCOPY WITH HYDROTHERMAL ABLATION    . ENDOMETRIAL ABLATION  12/27/07  . HYSTEROSCOPY  12/27/07   and HTA    reports that she quit smoking about 31 years ago. Her smoking use included cigarettes. She has a 2.50 pack-year smoking history. She has never used smokeless tobacco. She reports that she does not drink alcohol or use drugs. family history  includes Colon cancer (age of onset: 24) in her mother; Diabetes in her paternal uncle; Heart attack (age of onset: 77) in her sister; Heart disease in her paternal grandfather; Heart disease (age of onset: 5) in her father; Heart failure in her paternal aunt; Hypertension in her mother; Stroke (age of onset: 42) in her father. Allergies  Allergen Reactions  . Ciprofloxacin     REACTION: hives  . Oxycodone-Acetaminophen     REACTION: hives     Review of Systems  Constitutional: Negative for appetite change, chills, fever and unexpected weight change.  Respiratory: Negative for shortness of breath and wheezing.   Cardiovascular: Negative for chest pain.  Gastrointestinal: Positive for constipation. Negative for abdominal pain, blood in stool, diarrhea, nausea and vomiting.  Genitourinary: Negative for dysuria.       Objective:   Physical Exam Constitutional:      Appearance: Normal appearance.  HENT:     Mouth/Throat:     Pharynx: Oropharynx is clear.  Neck:     Musculoskeletal: Neck supple.  Cardiovascular:     Rate and Rhythm: Normal rate and regular rhythm.  Pulmonary:     Effort: Pulmonary effort is normal.     Breath sounds: Normal breath sounds.  Abdominal:     General: Bowel sounds are normal. There is no distension.     Palpations: Abdomen is soft. There is no mass.  Tenderness: There is no abdominal tenderness. There is no guarding or rebound.  Lymphadenopathy:     Cervical: No cervical adenopathy.  Neurological:     Mental Status: She is alert.        Assessment:     #1 year history of progressive solid food dysphagia.  She has not had any red flags such as appetite or weight changes.  No active GERD symptoms  #2 history of mild elevated liver transaminases of undetermined etiology  #3 concern for previous COVID-19 infection.  Patient requesting antibody testing  #4 history of chronic idiopathic constipation    Plan:     -Set up GI referral.   Needs colonoscopy as she has never had full colonoscopy and may need EGD to further evaluate dysphagia symptoms as above  -We discussed limitations of COVID-19 antibody testing.  Patient would nevertheless like to proceed  -Check comprehensive metabolic panel and CBC  Eulas Post MD  Primary Care at [facility]

## 2018-08-31 NOTE — Patient Instructions (Signed)

## 2018-09-01 ENCOUNTER — Encounter: Payer: Self-pay | Admitting: Family Medicine

## 2018-09-01 LAB — SAR COV2 SEROLOGY (COVID19)AB(IGG),IA: SARS CoV2 AB IGG: NEGATIVE

## 2018-09-07 ENCOUNTER — Other Ambulatory Visit: Payer: Self-pay

## 2018-09-07 MED ORDER — YUVAFEM 10 MCG VA TABS: ORAL_TABLET | VAGINAL | 0 refills | Status: DC

## 2018-09-07 NOTE — Telephone Encounter (Signed)
Mills Koller  to Regina Eck, CNM       09/06/18 2:12 PM  Hi,  My last appointment was in November. At that time I had enough Yuvafem. I am now out of refills. Can you please send a [facility] to [facility] (I need the brand name. The generic doesn't work) 1 twice weekly.  Thank you,  Sybel Standish 2062/04/22

## 2018-09-07 NOTE — Telephone Encounter (Signed)
Telephone encounter created  

## 2018-09-07 NOTE — Telephone Encounter (Signed)
Medication refill request: Yuvafem 10 mcg vaginal tablets Last AEX:  11/12/2015 Next AEX: 12/15/2018 Last MMG (if hormonal medication request): 11/25/2017 Birads 2 benign Refill authorized: Yuvafem 10 mcg place 1 tablet vaginally twice weekly #[id number]  Please refill if appropriate.

## 2018-09-14 ENCOUNTER — Ambulatory Visit: Payer: BLUE CROSS/BLUE SHIELD | Admitting: Gastroenterology

## 2018-09-16 ENCOUNTER — Ambulatory Visit (INDEPENDENT_AMBULATORY_CARE_PROVIDER_SITE_OTHER): Payer: BLUE CROSS/BLUE SHIELD | Admitting: Gastroenterology

## 2018-09-16 ENCOUNTER — Telehealth: Payer: Self-pay | Admitting: Gastroenterology

## 2018-09-16 ENCOUNTER — Encounter: Payer: Self-pay | Admitting: Gastroenterology

## 2018-09-16 ENCOUNTER — Other Ambulatory Visit (HOSPITAL_COMMUNITY): Payer: Self-pay | Admitting: *Deleted

## 2018-09-16 ENCOUNTER — Other Ambulatory Visit: Payer: Self-pay

## 2018-09-16 VITALS — Ht 60.0 in | Wt 105.0 lb

## 2018-09-16 DIAGNOSIS — R1310 Dysphagia, unspecified: Secondary | ICD-10-CM

## 2018-09-16 DIAGNOSIS — Z1211 Encounter for screening for malignant neoplasm of colon: Secondary | ICD-10-CM | POA: Diagnosis not present

## 2018-09-16 NOTE — Progress Notes (Signed)
Review of pertinent gastrointestinal problems: 1. Elevated liver tests (chronic, at least for 5-10 years by time of formal evaluation 07-25-14): imaging [place] and MRI 07/25/2014 were both essentially normal: Labs 2016/17: Hepatitis C antibody negative , ceruloplasmin normal, alpha-1 antitrypsin level normal , ANA negative, AMA negative, Iron studies normal Liver tests since Jul 24, 2009; she is immune to hepatitis B, anti-smooth muscle antibody negative, celiac sprue testing negative, hepatitis A serology not immune, INR normal: AST and ALT have been intermittently elevated dating back at least to 2009-07-24 and they were 40 and 70 respectively. Most recently ALT and AST in the 1-200 range. The remaining liver tests have all been normal persistently.  LFTs have trended to nearly normal by April 2017  LFTs normal May 2020   This service was provided via virtual visit.  We tried audio and visual however the visual app was not working for her and so we went with audio and he had the patient was located at home.  I was located in my office.  The patient did consent to this virtual visit and is aware of possible charges through their insurance for this visit.  The patient is a new patient.  My certified medical assistant, Grace Bushy, contributed to this visit by contacting the patient by phone 1 or 2 business days prior to the appointment and also followed up on the recommendations I made after the visit.  Time spent on virtual visit: 22 min   HPI: This is a very pleasant 58 year old woman whom I last saw about 3 years ago.  She is here today for a different problem.  She is here today for a different problem.  For about a year she has had problems with coughing after she eats particular types of food: especially apples, carrots, pretzles can cause coughing.  Sometimes it will trigger sneezing as well.  These foods ALWAYS cause coughing.  She feels secretions in her lungs that want to come out.  She never has coughing fits  when she is not eating these types of foods.  She does not really describe symptoms of esophageal dysphasia.  She has no coughing or any troubles at all swallowing liquids. Overall her weight has been stable.  She sort of recalls seeing Dr. Collene Mares years ago around 2011-07-25. The EGD was for dysphasia.  She was noted to have some antral erosions as well as suggestions of eosinophilic esophagitis morphologically in her esophagus.  Multiple biopsies were taken.  I do not have any of the pathology results from that test however.  Chief complaint is coughing  ROS: complete GI ROS as described in HPI, all other review negative.  Constitutional:  No unintentional weight loss   Past Medical History:  Diagnosis Date  . Anemia   . Anorexia nervosa teen  . DEPRESSION 08/09/2009  . Insomnia   . 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  . TRANSAMINASES, SERUM, ELEVATED 08/14/2009    Past Surgical History:  Procedure Laterality Date  . BREAST ENHANCEMENT SURGERY Bilateral   . CESAREAN SECTION     X 3  . DILITATION & CURRETTAGE/HYSTROSCOPY WITH HYDROTHERMAL ABLATION    . ENDOMETRIAL ABLATION  12/27/07  . HYSTEROSCOPY  12/27/07   and HTA    Current Outpatient Medications  Medication Sig Dispense Refill  . clonazePAM (KLONOPIN) 0.5 MG tablet Take 0.5 mg by mouth daily as needed for anxiety.     Marland Kitchen doxycycline (VIBRAMYCIN) 100 MG capsule Take  100 mg by mouth daily.    Marland Kitchen ibuprofen (ADVIL,MOTRIN) 200 MG tablet Take 200 mg by mouth every 6 (six) hours as needed.    Marland Kitchen MAGNESIUM PO Take by mouth.    . ORACEA 40 MG capsule     . Probiotic Product (PROBIOTIC PO) Take by mouth.    Marland Kitchen UNABLE TO FIND 5HTP    . UNABLE TO FIND GABA    . valACYclovir (VALTREX) 500 MG tablet TAKE 1 TABLET TWICE A DAY (Patient taking differently: Take 500 mg by mouth as needed. ) 90 tablet 3  . venlafaxine XR (EFFEXOR-XR) 150 MG 24 hr capsule Take 150 mg by mouth daily with breakfast.    .  YUVAFEM 10 MCG TABS vaginal tablet INSERT 1 TABLET VAGINALLY TWICE WEEKLY 24 tablet 0   No current facility-administered medications for this visit.     Allergies as of 09/16/2018 - Review Complete 09/16/2018  Allergen Reaction Noted  . Ciprofloxacin  08/09/2009  . Oxycodone-acetaminophen  08/09/2009    Family History  Problem Relation Age of Onset  . Hypertension Mother   . Colon cancer Mother 14       colon rescection  . Heart disease Father 46       CABG62  . Stroke Father 30  . Heart disease Paternal Grandfather   . Heart attack Sister 66       no stents  . Heart failure Paternal Aunt   . Diabetes Paternal Uncle     Social History   Socioeconomic History  . Marital status: Married    Spouse name: [name]  . Number of children: 3  . Years of education: Master's  . Highest education level: Not on file  Occupational History  . Occupation: Optician, dispensing: [employer]  . Financial resource strain: Not on file  . Food insecurity:    Worry: Not on file    Inability: Not on file  . Transportation needs:    Medical: Not on file    Non-medical: Not on file  Tobacco Use  . Smoking status: Former Smoker    Packs/day: 0.50    Years: 5.00    Pack years: 2.50    Types: Cigarettes    Last attempt to quit: 07/10/1987    Years since quitting: 31.2  . Smokeless tobacco: Never Used  Substance and Sexual Activity  . Alcohol use: No  . Drug use: No  . Sexual activity: Yes    Partners: Male    Birth control/protection: Post-menopausal, Other-see comments    Comment: vasectomy  Lifestyle  . Physical activity:    Days per week: Not on file    Minutes per session: Not on file  . Stress: Not on file  Relationships  . Social connections:    Talks on phone: Not on file    Gets together: Not on file    Attends religious service: Not on file    Active member of club or organization: Not on file    Attends meetings of clubs or organizations:  Not on file    Relationship status: Not on file  . Intimate partner violence:    Fear of current or ex partner: Not on file    Emotionally abused: Not on file    Physically abused: Not on file    Forced sexual activity: Not on file  Other Topics Concern  . Not on file  Social History Narrative   Patient is married [name])  and lives at home with her husband and two children.   Patient has three children.   Patient works at [employer].   Patient has a Master's degree.   Patient is left-handed.   Patient drinks 6 cups of coffee daily.     Physical Exam: Unable to perform because this was a "telemed visit" due to current Covid-19 pandemic  Assessment and plan: 58 y.o. female with coughing after eating particular types of foods  It does not seem that she is really having any esophageal dysphasia.  What she is describing is coughing fits after she eats hard foods.  She said particularly this is noticeable with apples, carrots and pretzels.  She never has coughing fits when she is not eating.  She does not have any type of history of food hanging or catching after she swallows.  This seems like it is an oropharyngeal problem and I recommended modified barium swallow study with speech therapy as well as a barium esophagram to check for overt stricturing disease.  It is interesting that she had an EGD in 2013 with a different gastroenterologist for dysphasia.  That gastroenterologist described some morphologic findings of eosinophilic esophagitis and she took biopsies.  I cannot see the biopsy reports at the time of this visit.  We will contact that provider to get them sent here for my review.  Please see the "Patient Instructions" section for addition details about the plan.  Owens Loffler, MD [facility] 09/16/2018, 2:15 PM

## 2018-09-16 NOTE — Patient Instructions (Addendum)
We will reach out to Dr. Lorie Apley office for pathology reports from her 2013 EGD biopsies.  We will also arrange for modified barium swallow study with speech therapy as well as a barium esophagram.  You have been scheduled for a Barium Esophogram at [facility] imaging at [facility]  on 09/23/18 at 10:10. Please arrive 15 minutes prior to your appointment for registration. Make certain not to have anything to eat or drink 3 hours prior to your test. If you need to reschedule for any reason, please contact radiology at [phone] to do so. __________________________________________________________________ A barium swallow is an examination that concentrates on views of the esophagus. This tends to be a double contrast exam (barium and two liquids which, when combined, create a gas to distend the wall of the oesophagus) or single contrast (non-ionic iodine based). The study is usually tailored to your symptoms so a good history is essential. Attention is paid during the study to the form, structure and configuration of the esophagus, looking for functional disorders (such as aspiration, dysphagia, achalasia, motility and reflux) EXAMINATION You may be asked to change into a gown, depending on the type of swallow being performed. A radiologist and radiographer will perform the procedure. The radiologist will advise you of the type of contrast selected for your procedure and direct you during the exam. You will be asked to stand, sit or lie in several different positions and to hold a small amount of fluid in your mouth before being asked to swallow while the imaging is performed .In some instances you may be asked to swallow barium coated marshmallows to assess the motility of a solid food bolus. The exam can be recorded as a digital or video fluoroscopy procedure. POST PROCEDURE It will take 1-2 days for the barium to pass through your system. To facilitate this, it is important, unless otherwise  directed, to increase your fluids for the next 24-48hrs and to resume your normal diet.  This test typically takes about 30 minutes to perform.  You have been scheduled for a modified barium swallow on     at   Please arrive 15 minutes prior to your test for registration. You will go to  Radiology (1st Floor) for your appointment. Should you need to cancel or reschedule your appointment, please contact [phone] [place] [facility] [place]) or [phone] [place]). _____________________________________________________________________ A Modified Barium Swallow Study, or MBS, is a special x-ray that is taken to check swallowing skills. It is carried out by a Stage manager and a Psychologist, clinical (SLP). During this test, yourmouth, throat, and esophagus, a muscular tube which connects your mouth to your stomach, is checked. The test will help you, your doctor, and the SLP plan what types of foods and liquids are easier for you to swallow. The SLP will also identify positions and ways to help you swallow more easily and safely. What will happen during an MBS? You will be taken to an x-ray room and seated comfortably. You will be asked to swallow small amounts of food and liquid mixed with barium. Barium is a liquid or paste that allows images of your mouth, throat and esophagus to be seen on x-ray. The x-ray captures moving images of the food you are swallowing as it travels from your mouth through your throat and into your esophagus. This test helps identify whether food or liquid is entering your lungs (aspiration). The test also shows which part of your mouth or throat lacks strength or coordination to move the  food or liquid in the right direction. This test typically takes 30 minutes to 1 hour to complete.  Thank you for entrusting me with your care and choosing [facility].  Dr  Ardis Hughs  _______________________________________________________________________  __________________________________________________________________________________

## 2018-09-16 NOTE — Telephone Encounter (Signed)
Pt called to update pharmacy:  [facility] at [address], [place]

## 2018-09-16 NOTE — Telephone Encounter (Signed)
noted and updated

## 2018-09-21 ENCOUNTER — Ambulatory Visit (HOSPITAL_COMMUNITY)
Admission: RE | Admit: 2018-09-21 | Discharge: 2018-09-21 | Disposition: A | Discharge status: Home / Self Care | Payer: BLUE CROSS/BLUE SHIELD | Source: Ambulatory Visit | Attending: Gastroenterology | Admitting: Gastroenterology

## 2018-09-21 ENCOUNTER — Other Ambulatory Visit: Payer: Self-pay

## 2018-09-21 DIAGNOSIS — R1310 Dysphagia, unspecified: Secondary | ICD-10-CM

## 2018-09-21 DIAGNOSIS — R05 Cough: Secondary | ICD-10-CM | POA: Diagnosis not present

## 2018-09-23 ENCOUNTER — Other Ambulatory Visit: Payer: BLUE CROSS/BLUE SHIELD

## 2018-09-27 ENCOUNTER — Other Ambulatory Visit: Payer: Self-pay | Admitting: Gastroenterology

## 2018-09-27 DIAGNOSIS — K200 Eosinophilic esophagitis: Secondary | ICD-10-CM

## 2018-09-27 MED ORDER — PEG 3350-KCL-NA BICARB-NACL 420 G PO SOLR: 4000.0000 mL | ORAL | 0 refills | Status: DC

## 2018-09-29 NOTE — Addendum Note (Signed)
Addended by: Grace Bushy A on: 09/29/2018 11:42 AM   Modules accepted: Orders

## 2018-10-04 ENCOUNTER — Telehealth: Payer: Self-pay | Admitting: *Deleted

## 2018-10-04 NOTE — Telephone Encounter (Signed)

## 2018-10-05 ENCOUNTER — Encounter: Payer: Self-pay | Admitting: Gastroenterology

## 2018-10-05 ENCOUNTER — Ambulatory Visit (AMBULATORY_SURGERY_CENTER): Payer: BLUE CROSS/BLUE SHIELD | Admitting: Gastroenterology

## 2018-10-05 ENCOUNTER — Telehealth: Payer: Self-pay | Admitting: Gastroenterology

## 2018-10-05 ENCOUNTER — Other Ambulatory Visit: Payer: Self-pay

## 2018-10-05 VITALS — BP 128/57 | HR 66 | Temp 98.3°F | Resp 9 | Ht 60.0 in | Wt 105.0 lb

## 2018-10-05 DIAGNOSIS — Z1211 Encounter for screening for malignant neoplasm of colon: Secondary | ICD-10-CM | POA: Diagnosis not present

## 2018-10-05 DIAGNOSIS — D123 Benign neoplasm of transverse colon: Secondary | ICD-10-CM

## 2018-10-05 DIAGNOSIS — R1310 Dysphagia, unspecified: Secondary | ICD-10-CM | POA: Diagnosis not present

## 2018-10-05 DIAGNOSIS — B3781 Candidal esophagitis: Secondary | ICD-10-CM

## 2018-10-05 DIAGNOSIS — K635 Polyp of colon: Secondary | ICD-10-CM | POA: Diagnosis not present

## 2018-10-05 MED ORDER — SODIUM CHLORIDE 0.9 % IV SOLN: 500.0000 mL | Freq: Once | INTRAVENOUS | Status: DC

## 2018-10-05 MED ORDER — FLUCONAZOLE 100 MG PO TABS: 100.0000 mg | ORAL_TABLET | Freq: Every day | ORAL | 0 refills | Status: DC

## 2018-10-05 NOTE — Progress Notes (Signed)
Called to room to assist during endoscopic procedure.  Patient ID and intended procedure confirmed with present staff. Received instructions for my participation in the procedure from the performing physician.  

## 2018-10-05 NOTE — Op Note (Signed)
[facility] Patient Name: Heather Davila Procedure Date: 10/05/2018 1:58 PM MRN: [id number] Endoscopist: Milus Banister , MD Age: 58 Referring MD:  Date of Birth: 1960/12/24 Gender: Female Account #: [id number] Procedure:                Upper GI endoscopy Indications:              coughing spells with particular foods only; Barium                            esophagram and MBSS were both normal. EGD remotely                            with Dr. Collene Mares suggested EoE endoscopically however                            path never able to be obtained. No GERD, no                            dysphagia. Medicines:                Monitored Anesthesia Care Procedure:                Pre-Anesthesia Assessment:                           - Prior to the procedure, a History and Physical                            was performed, and patient medications and                            allergies were reviewed. The patient's tolerance of                            previous anesthesia was also reviewed. The risks                            and benefits of the procedure and the sedation                            options and risks were discussed with the patient.                            All questions were answered, and informed consent                            was obtained. Prior Anticoagulants: The patient has                            taken no previous anticoagulant or antiplatelet                            agents. ASA Grade Assessment: II - A patient with  mild systemic disease. After reviewing the risks                            and benefits, the patient was deemed in                            satisfactory condition to undergo the procedure.                           After obtaining informed consent, the endoscope was                            passed under direct vision. Throughout the                            procedure, the patient's blood pressure, pulse,  and                            oxygen saturations were monitored continuously. The                            Model GIF-HQ190 [id number]) scope was introduced                            through the mouth, and advanced to the second part                            of duodenum. The upper GI endoscopy was                            accomplished without difficulty. The patient                            tolerated the procedure well. Scope In: Scope Out: Findings:                 Classic appearing candida esophagitis throughout                            the entire esophagus.                           The exam was otherwise without abnormality. Complications:            No immediate complications. Estimated blood loss:                            None. Estimated Blood Loss:     Estimated blood loss: none. Impression:               - Candida esophagitis.                           - The examination was otherwise normal.                           - No specimens collected. Recommendation:           -  Patient has a contact number available for                            emergencies. The signs and symptoms of potential                            delayed complications were discussed with the                            patient. Return to normal activities tomorrow.                            Written discharge instructions were provided to the                            patient.                           - Resume previous diet.                           - Continue present medications.                           - Please start diflucan prescription that was                            called in today (diflucan 100mg  pills, one pill                            daily for 10 days, no refills).                           - Call Dr. Ardis Hughs' office to report on your                            response in 3-4 weeks. Milus Banister, MD 10/05/2018 2:38:09 PM This report has been signed electronically.

## 2018-10-05 NOTE — Telephone Encounter (Signed)
Patient husband called said that they sent the medication to the wrong location it needs to [facility] [address] [place] for fluconazole (DIFLUCAN) 100 MG

## 2018-10-05 NOTE — Op Note (Signed)
[facility] Patient Name: Heather Davila Procedure Date: 10/05/2018 1:58 PM MRN: [id number] Endoscopist: Milus Banister , MD Age: 58 Referring MD:  Date of Birth: 1960/05/30 Gender: Female Account #: [id number] Procedure:                Colonoscopy Indications:              Screening for colorectal malignant neoplasm Medicines:                Monitored Anesthesia Care Procedure:                Pre-Anesthesia Assessment:                           - Prior to the procedure, a History and Physical                            was performed, and patient medications and                            allergies were reviewed. The patient's tolerance of                            previous anesthesia was also reviewed. The risks                            and benefits of the procedure and the sedation                            options and risks were discussed with the patient.                            All questions were answered, and informed consent                            was obtained. Prior Anticoagulants: The patient has                            taken no previous anticoagulant or antiplatelet                            agents. ASA Grade Assessment: II - A patient with                            mild systemic disease. After reviewing the risks                            and benefits, the patient was deemed in                            satisfactory condition to undergo the procedure.                           After obtaining informed consent, the colonoscope  was passed under direct vision. Throughout the                            procedure, the patient's blood pressure, pulse, and                            oxygen saturations were monitored continuously. The                            Colonoscope was introduced through the anus and                            advanced to the the cecum, identified by                            appendiceal orifice and  ileocecal valve. The                            colonoscopy was performed without difficulty. The                            patient tolerated the procedure well. The quality                            of the bowel preparation was good. The ileocecal                            valve, appendiceal orifice, and rectum were                            photographed. Scope In: 2:05:44 PM Scope Out: 2:24:55 PM Scope Withdrawal Time: 0 hours 9 minutes 53 seconds  Total Procedure Duration: 0 hours 19 minutes 11 seconds  Findings:                 A 14 mm polyp was found in the hepatic flexure. The                            polyp was sessile. The polyp was removed with a hot                            snare. Resection and retrieval were complete.                           The exam was otherwise without abnormality on                            direct and retroflexion views. Complications:            No immediate complications. Estimated blood loss:                            None. Estimated Blood Loss:     Estimated blood loss: none. Impression:               -  One 14 mm polyp at the hepatic flexure, removed                            with a hot snare. Resected and retrieved.                           - The examination was otherwise normal on direct                            and retroflexion views. Recommendation:           - Patient has a contact number available for                            emergencies. The signs and symptoms of potential                            delayed complications were discussed with the                            patient. Return to normal activities tomorrow.                            Written discharge instructions were provided to the                            patient.                           - Resume previous diet.                           - Continue present medications.                           You will receive a letter within 2-3 weeks with the                             pathology results and my final recommendations.                           If the polyp(s) is proven to be 'pre-cancerous' on                            pathology, you will need repeat colonoscopy in 3                            years. If the polyp(s) is NOT 'precancerous' on                            pathology then you should repeat colon cancer                            screening in 10 years with colonoscopy without need  for colon cancer screening by any method prior to                            then (including stool testing). Milus Banister, MD 10/05/2018 2:26:54 PM This report has been signed electronically.

## 2018-10-05 NOTE — Progress Notes (Signed)
Pt's states no medical or surgical changes since previsit or office visit. 

## 2018-10-05 NOTE — Progress Notes (Signed)
Temp taken by Emmit Alexanders, CMA VS taken by Rica Mote, CMA

## 2018-10-05 NOTE — Patient Instructions (Signed)
Information on polyps given to you today.  Await pathology results.  Start new medication, Diflucan, today.    YOU HAD AN ENDOSCOPIC PROCEDURE TODAY AT [facility]:   Refer to the procedure report that was given to you for any specific questions about what was found during the examination.  If the procedure report does not answer your questions, please call your gastroenterologist to clarify.  If you requested that your care partner not be given the details of your procedure findings, then the procedure report has been included in a sealed envelope for you to review at your convenience later.  YOU SHOULD EXPECT: Some feelings of bloating in the abdomen. Passage of more gas than usual.  Walking can help get rid of the air that was put into your GI tract during the procedure and reduce the bloating. If you had a lower endoscopy (such as a colonoscopy or flexible sigmoidoscopy) you may notice spotting of blood in your stool or on the toilet paper. If you underwent a bowel prep for your procedure, you may not have a normal bowel movement for a few days.  Please Note:  You might notice some irritation and congestion in your nose or some drainage.  This is from the oxygen used during your procedure.  There is no need for concern and it should clear up in a day or so.  SYMPTOMS TO REPORT IMMEDIATELY:   Following lower endoscopy (colonoscopy or flexible sigmoidoscopy):  Excessive amounts of blood in the stool  Significant tenderness or worsening of abdominal pains  Swelling of the abdomen that is new, acute  Fever of 100F or higher   Following upper endoscopy (EGD)  Vomiting of blood or coffee ground material  New chest pain or pain under the shoulder blades  Painful or persistently difficult swallowing  New shortness of breath  Fever of 100F or higher  Black, tarry-looking stools  For urgent or emergent issues, a gastroenterologist can be reached at any hour by calling [phone]  [phone].   DIET:  We do recommend a small meal at first, but then you may proceed to your regular diet.  Drink plenty of fluids but you should avoid alcoholic beverages for 24 hours.  ACTIVITY:  You should plan to take it easy for the rest of today and you should NOT DRIVE or use heavy machinery until tomorrow (because of the sedation medicines used during the test).    FOLLOW UP: Our staff will call the number listed on your records 48-72 hours following your procedure to check on you and address any questions or concerns that you may have regarding the information given to you following your procedure. If we do not reach you, we will leave a message.  We will attempt to reach you two times.  During this call, we will ask if you have developed any symptoms of COVID 19. If you develop any symptoms (ie: fever, flu-like symptoms, shortness of breath, cough etc.) before then, please call [phone].  If you test positive for Covid 19 in the 2 weeks post procedure, please call and report this information to [place].    If any biopsies were taken you will be contacted by phone or by letter within the next 1-3 weeks.  Please call us at [phone] if you have not heard about the biopsies in 3 weeks.    SIGNATURES/CONFIDENTIALITY: You and/or your care partner have signed paperwork which will be entered into your electronic medical record.  These  signatures attest to the fact that that the information above on your After Visit Summary has been reviewed and is understood.  Full responsibility of the confidentiality of this discharge information lies with you and/or your care-partner.

## 2018-10-05 NOTE — Progress Notes (Signed)
To PACU, VSS. Report to Rn.tb 

## 2018-10-06 NOTE — Telephone Encounter (Signed)
Attempted to call patient back to confirm pharmacy as I see [facility] in [place], [address] No answer. Left message.

## 2018-10-06 NOTE — Telephone Encounter (Signed)
Patient return call please call back thanks.

## 2018-10-06 NOTE — Telephone Encounter (Signed)
Spoke with Mitzi Hansen at [facility] to cancel Rx for Diflucan. Spoke with [facility] to [facility].Order still being held. Called back [facility], order was cancelled but could not be released as it has already been shipped. Notified patient who states understanding. [facility] states it should be delivered in 3-5 business days. Dr. Ardis Hughs aware.

## 2018-10-07 ENCOUNTER — Telehealth: Payer: Self-pay | Admitting: *Deleted

## 2018-10-07 NOTE — Telephone Encounter (Signed)
  Follow up Call-  Call back number 10/05/2018  Post procedure Call Back phone  # [phone]  Permission to leave phone message Yes  Some recent data might be hidden     Patient questions:  Message left to call us if necessary.

## 2018-10-07 NOTE — Telephone Encounter (Signed)
  Follow up Call-  Call back number 10/05/2018  Post procedure Call Back phone  # [phone]  Permission to leave phone message Yes  Some recent data might be hidden     Patient questions:  Do you have a fever, pain , or abdominal swelling? No. Pain Score  0 *  Have you tolerated food without any problems? Yes.    Have you been able to return to your normal activities? Yes.    Do you have any questions about your discharge instructions: Diet   No. Medications  No. Follow up visit  No.  Do you have questions or concerns about your Care? No.  Actions: * If pain score is 4 or above: No action needed, pain <4.  1. Have you developed a fever since your procedure? NO  2.   Have you had an respiratory symptoms (SOB or cough) since your procedure? NO  3.   Have you tested positive for COVID 19 since your procedure NO  4.   Have you had any family members/close contacts diagnosed with the COVID 19 since your procedure?  NO   If yes to any of these questions please route to Joylene John, RN and Alphonsa Gin, RN.

## 2018-10-13 ENCOUNTER — Encounter: Payer: Self-pay | Admitting: Gastroenterology

## 2018-10-25 ENCOUNTER — Other Ambulatory Visit: Payer: Self-pay | Admitting: Certified Nurse Midwife

## 2018-10-25 DIAGNOSIS — Z1231 Encounter for screening mammogram for malignant neoplasm of breast: Secondary | ICD-10-CM

## 2018-11-01 ENCOUNTER — Other Ambulatory Visit: Payer: Self-pay

## 2018-11-01 DIAGNOSIS — R053 Chronic cough: Secondary | ICD-10-CM

## 2018-11-01 DIAGNOSIS — R05 Cough: Secondary | ICD-10-CM

## 2018-11-01 MED ORDER — OMEPRAZOLE 40 MG PO CPDR: 40.0000 mg | DELAYED_RELEASE_CAPSULE | Freq: Every day | ORAL | 3 refills | Status: DC

## 2018-11-02 ENCOUNTER — Encounter: Payer: Self-pay | Admitting: Psychiatry

## 2018-11-02 ENCOUNTER — Telehealth: Payer: Self-pay | Admitting: Gastroenterology

## 2018-11-02 ENCOUNTER — Other Ambulatory Visit: Payer: Self-pay

## 2018-11-02 ENCOUNTER — Ambulatory Visit (INDEPENDENT_AMBULATORY_CARE_PROVIDER_SITE_OTHER): Payer: BLUE CROSS/BLUE SHIELD | Admitting: Psychiatry

## 2018-11-02 VITALS — BP 101/56 | HR 64 | Ht 60.0 in | Wt 105.0 lb

## 2018-11-02 DIAGNOSIS — F3342 Major depressive disorder, recurrent, in full remission: Secondary | ICD-10-CM

## 2018-11-02 DIAGNOSIS — F419 Anxiety disorder, unspecified: Secondary | ICD-10-CM

## 2018-11-02 MED ORDER — CLONAZEPAM 0.5 MG PO TABS: 0.5000 mg | ORAL_TABLET | Freq: Every day | ORAL | 3 refills | Status: DC | PRN

## 2018-11-02 MED ORDER — VENLAFAXINE HCL ER 150 MG PO CP24: 150.0000 mg | ORAL_CAPSULE | Freq: Every day | ORAL | 1 refills | Status: DC
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
## 2018-11-02 NOTE — Telephone Encounter (Signed)
Patient called said that why is she getting a medication for GERD if on 5/26 it was known that she does not have GERD.Marland Kitchen and also that she would like to know if she can just try diflucan before being referred to pulmonary.

## 2018-11-02 NOTE — Telephone Encounter (Signed)
Ok, diflucan 100mg  pills, one pill once daily for 10 days.

## 2018-11-02 NOTE — Telephone Encounter (Signed)
Spoke with pt and explained Dr. Ardis Hughs was wanting her to try the omeprazole for chronic cough and he was not sure if it was gerd related or not. Pt does not want to try this, states she only coughs when she eats solid food. Pt thinks she needs another round of diflucan, she would like to try another 10days of the diflucan and does not feel she needs to see pulmonary. Please advise.

## 2018-11-03 ENCOUNTER — Other Ambulatory Visit: Payer: Self-pay

## 2018-11-03 MED ORDER — FLUCONAZOLE 100 MG PO TABS: 100.0000 mg | ORAL_TABLET | Freq: Every day | ORAL | 0 refills | Status: DC

## 2018-11-03 NOTE — Telephone Encounter (Signed)
Spoke with pt and script sent to pharmacy. 

## 2018-11-08 ENCOUNTER — Encounter: Payer: Self-pay | Admitting: Certified Nurse Midwife

## 2018-11-08 ENCOUNTER — Ambulatory Visit: Payer: BLUE CROSS/BLUE SHIELD | Admitting: Certified Nurse Midwife

## 2018-11-08 ENCOUNTER — Other Ambulatory Visit: Payer: Self-pay

## 2018-11-08 ENCOUNTER — Other Ambulatory Visit (HOSPITAL_COMMUNITY)
Admission: RE | Admit: 2018-11-08 | Discharge: 2018-11-08 | Disposition: A | Discharge status: Home / Self Care | Payer: BLUE CROSS/BLUE SHIELD | Source: Ambulatory Visit | Attending: Certified Nurse Midwife | Admitting: Certified Nurse Midwife

## 2018-11-08 VITALS — BP 94/64 | HR 68 | Temp 97.3°F | Resp 16 | Ht 59.75 in | Wt 105.0 lb

## 2018-11-08 DIAGNOSIS — Z124 Encounter for screening for malignant neoplasm of cervix: Secondary | ICD-10-CM | POA: Diagnosis not present

## 2018-11-08 DIAGNOSIS — Z01419 Encounter for gynecological examination (general) (routine) without abnormal findings: Secondary | ICD-10-CM | POA: Diagnosis not present

## 2018-11-08 DIAGNOSIS — Z0000 Encounter for general adult medical examination without abnormal findings: Secondary | ICD-10-CM

## 2018-11-08 DIAGNOSIS — A6009 Herpesviral infection of other urogenital tract: Secondary | ICD-10-CM

## 2018-11-08 DIAGNOSIS — E559 Vitamin D deficiency, unspecified: Secondary | ICD-10-CM

## 2018-11-08 DIAGNOSIS — N951 Menopausal and female climacteric states: Secondary | ICD-10-CM

## 2018-11-08 MED ORDER — VALACYCLOVIR HCL 500 MG PO TABS: ORAL_TABLET | ORAL | 12 refills | Status: DC

## 2018-11-08 NOTE — Progress Notes (Signed)
58 y.o. G11P3003 Married  Caucasian Fe here for annual exam. Menopausal symptoms improving and less hot flashes. Denies vaginal bleeding or vaginal dryness at this point, using Yuvafem with good results. Occasional outbreak of HSV, needs Valtrex updated. Has been exercising to help with the Covid 19 changes. Sees PCP aex, Klonopin, Effexor management.. No other health issues today.  Patient's last menstrual period was 12/27/2007 (exact date).          Sexually active: Yes.    The current method of family planning is vasectomy.    Exercising: Yes.    walking Smoker:  no  Review of Systems  Constitutional: Negative.   HENT: Negative.   Eyes: Negative.   Respiratory: Negative.   Cardiovascular: Negative.   Gastrointestinal: Negative.   Genitourinary: Negative.   Musculoskeletal: Negative.   Skin: Negative.   Neurological: Negative.   Endo/Heme/Allergies: Negative.   Psychiatric/Behavioral: Negative.     Health Maintenance: Pap:  11-12-15 neg HPV HR neg, 2019 done in [place]. History of Abnormal Pap: no MMG:  Bilateral & u/s 11-25-17 neg Self Breast exams: no Colonoscopy:  2020 f/u 37yrs polyp BMD: 2019 osteopenia    TDaP:  2019 Shingles: not done Pneumonia: not done Hep C and HIV: both neg per patient Labs: if needed   reports that she quit smoking about 31 years ago. Her smoking use included cigarettes. She has a 2.50 pack-year smoking history. She has never used smokeless tobacco. She reports that she does not drink alcohol or use drugs.  Past Medical History:  Diagnosis Date  . Anemia   . Anorexia nervosa teen  . DEPRESSION 08/09/2009  . Insomnia   . 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  . TRANSAMINASES, SERUM, ELEVATED 08/14/2009    Past Surgical History:  Procedure Laterality Date  . BREAST ENHANCEMENT SURGERY Bilateral   . CESAREAN SECTION     X 3  . DILITATION & CURRETTAGE/HYSTROSCOPY WITH HYDROTHERMAL ABLATION    .  ENDOMETRIAL ABLATION  12/27/07  . HYSTEROSCOPY  12/27/07   and HTA    Current Outpatient Medications  Medication Sig Dispense Refill  . clonazePAM (KLONOPIN) 0.5 MG tablet Take 1 tablet (0.5 mg total) by mouth daily as needed for anxiety. 30 tablet 3  . fluconazole (DIFLUCAN) 100 MG tablet Take 1 tablet (100 mg total) by mouth daily. 10 tablet 0  . fluconazole (DIFLUCAN) 100 MG tablet Take 1 tablet (100 mg total) by mouth daily. 10 tablet 0  . ibuprofen (ADVIL,MOTRIN) 200 MG tablet Take 200 mg by mouth every 6 (six) hours as needed.    Marland Kitchen MAGNESIUM PO Take by mouth.    Marland Kitchen omeprazole (PRILOSEC) 40 MG capsule Take 1 capsule (40 mg total) by mouth daily. (Patient not taking: Reported on 11/02/2018) 90 capsule 3  . ORACEA 40 MG capsule     . valACYclovir (VALTREX) 500 MG tablet TAKE 1 TABLET TWICE A DAY (Patient taking differently: Take 500 mg by mouth as needed. ) 90 tablet 3  . venlafaxine XR (EFFEXOR-XR) 150 MG 24 hr capsule Take 1 capsule (150 mg total) by mouth daily with breakfast. 90 capsule 1  . YUVAFEM 10 MCG TABS vaginal tablet INSERT 1 TABLET VAGINALLY TWICE WEEKLY 24 tablet 0   No current facility-administered medications for this visit.     Family History  Problem Relation Age of Onset  . Hypertension Mother   . Colon cancer Mother 51  colon rescection  . Heart disease Father 89       CABG62  . Stroke Father 95  . Heart disease Paternal Grandfather   . Heart attack Sister 69       no stents  . Depression Sister   . Heart failure Paternal Aunt   . Diabetes Paternal Uncle   . Esophageal cancer Neg Hx   . Rectal cancer Neg Hx   . Stomach cancer Neg Hx     ROS:  Pertinent items are noted in HPI.  Otherwise, a comprehensive ROS was negative.  Exam:   LMP 12/27/2007 (Exact Date)    Ht Readings from Last 3 Encounters:  10/05/18 5' (1.524 m)  09/16/18 5' (1.524 m)  08/31/18 5' (1.524 m)    General appearance: alert, cooperative and appears stated age Head:  Normocephalic, without obvious abnormality, atraumatic Neck: no adenopathy, supple, symmetrical, trachea midline and thyroid normal to inspection and palpation Lungs: clear to auscultation bilaterally Breasts: normal appearance, no masses or tenderness, No nipple retraction or dimpling, No nipple discharge or bleeding, No axillary or supraclavicular adenopathy, implants palpate intact Heart: regular rate and rhythm Abdomen: soft, non-tender; no masses,  no organomegaly Extremities: extremities normal, atraumatic, no cyanosis or edema Skin: Skin color, texture, turgor normal. No rashes or lesions Lymph nodes: Cervical, supraclavicular, and axillary nodes normal. No abnormal inguinal nodes palpated Neurologic: Grossly normal   Pelvic: External genitalia:  no lesions              Urethra:  normal appearing urethra with no masses, tenderness or lesions              Bartholin's and Skene's: normal                 Vagina: normal appearing vagina with normal color and discharge, no lesions              Cervix: no cervical motion tenderness, no lesions and normal appearance              Pap taken: Yes.   Bimanual Exam:  Uterus:  normal size, contour, position, consistency, mobility, non-tender and anteverted              Adnexa: normal adnexa and no mass, fullness, tenderness               Rectovaginal: Confirms               Anus:  normal sphincter tone, no lesions  Chaperone present: yes  A:  Well Woman with normal exam  Menopausal no HRT  Vaginal dryness Yuvafem working well, desires continuance  HSV 2 history, needs Valtrex update  Screening labs  P:   Reviewed health and wellness pertinent to exam  Aware of need to advise if vaginal bleeding  Discussed risks/benefits and warning signs with use, request Rx.   Rx Yuvafem see order with instructions  Rx Valtrex see order with instructions  Labs: TSH, Lipid panel, Vitamin D  Pap smear: yes   counseled on breast self exam, mammography  screening, feminine hygiene, adequate intake of calcium and vitamin D, diet and exercise  return annually or prn  An After Visit Summary was printed and given to the patient.

## 2018-11-09 ENCOUNTER — Encounter: Payer: Self-pay | Admitting: Certified Nurse Midwife

## 2018-11-09 ENCOUNTER — Telehealth: Payer: Self-pay | Admitting: Gastroenterology

## 2018-11-09 LAB — LIPID PANEL
Chol/HDL Ratio: 4 ratio (ref 0.0–4.4)
Cholesterol, Total: 204 mg/dL — ABNORMAL HIGH (ref 100–199)
HDL: 51 mg/dL (ref >39)
LDL Calculated: 139 mg/dL — ABNORMAL HIGH (ref 0–99)
Triglycerides: 71 mg/dL (ref 0–149)
VLDL Cholesterol Cal: 14 mg/dL (ref 5–40)

## 2018-11-09 LAB — TSH: TSH: 3.95 u[IU]/mL (ref 0.450–4.500)

## 2018-11-09 LAB — VITAMIN D 25 HYDROXY (VIT D DEFICIENCY, FRACTURES): Vit D, 25-Hydroxy: 46.2 ng/mL (ref 30.0–100.0)

## 2018-11-09 MED ORDER — YUVAFEM 10 MCG VA TABS: ORAL_TABLET | VAGINAL | 3 refills | Status: DC

## 2018-11-09 NOTE — Telephone Encounter (Signed)
   EGD, June 2013, Dr. Collene Mares, indication dysphasia and acid reflux.  Findings "linear folds in the esophagus biopsy x3 to rule out eosinophilic esophagitis.  Antral erosions.  Otherwise normal EGD."  Pathology showed "unremarkable squamous mucosa".

## 2018-11-11 LAB — CYTOLOGY - PAP
Diagnosis: NEGATIVE
HPV: NOT DETECTED

## 2018-12-03 ENCOUNTER — Ambulatory Visit
Admission: RE | Admit: 2018-12-03 | Discharge: 2018-12-03 | Disposition: A | Discharge status: Home / Self Care | Payer: BLUE CROSS/BLUE SHIELD | Source: Ambulatory Visit | Attending: Certified Nurse Midwife | Admitting: Certified Nurse Midwife

## 2018-12-03 ENCOUNTER — Other Ambulatory Visit: Payer: Self-pay | Admitting: Certified Nurse Midwife

## 2018-12-03 ENCOUNTER — Other Ambulatory Visit: Payer: Self-pay

## 2018-12-03 DIAGNOSIS — Z1231 Encounter for screening mammogram for malignant neoplasm of breast: Secondary | ICD-10-CM | POA: Diagnosis not present

## 2018-12-15 ENCOUNTER — Ambulatory Visit: Payer: BLUE CROSS/BLUE SHIELD | Admitting: Certified Nurse Midwife

## 2019-01-19 ENCOUNTER — Telehealth: Payer: Self-pay | Admitting: Psychiatry

## 2019-01-19 NOTE — Telephone Encounter (Signed)
Should have refills already on file, will contact pharmacy

## 2019-01-19 NOTE — Telephone Encounter (Signed)
Patient need refill on Klonapin to be sent to [facility] in [place]

## 2019-01-20 NOTE — Telephone Encounter (Signed)
I called her pharmacy and she has 3 refills on file. They are getting it ready for her.

## 2019-03-06 MED ORDER — YUVAFEM 10 MCG VA TABS: ORAL_TABLET | VAGINAL | 3 refills | Status: DC

## 2019-03-06 NOTE — Addendum Note (Signed)
Addended by: Regina Eck on: 03/06/2019 06:23 PM   Modules accepted: Orders

## 2019-03-10 ENCOUNTER — Ambulatory Visit (INDEPENDENT_AMBULATORY_CARE_PROVIDER_SITE_OTHER): Payer: BLUE CROSS/BLUE SHIELD | Admitting: Psychiatry

## 2019-03-10 ENCOUNTER — Other Ambulatory Visit: Payer: Self-pay

## 2019-03-10 ENCOUNTER — Encounter: Payer: Self-pay | Admitting: Psychiatry

## 2019-03-10 DIAGNOSIS — F419 Anxiety disorder, unspecified: Secondary | ICD-10-CM

## 2019-03-10 DIAGNOSIS — F3342 Major depressive disorder, recurrent, in full remission: Secondary | ICD-10-CM

## 2019-03-10 MED ORDER — CLONAZEPAM 0.5 MG PO TABS: ORAL_TABLET | ORAL | 5 refills | Status: DC

## 2019-03-10 MED ORDER — VENLAFAXINE HCL ER 150 MG PO CP24: 150.0000 mg | ORAL_CAPSULE | Freq: Every day | ORAL | 1 refills | Status: DC

## 2019-03-10 NOTE — Progress Notes (Signed)
Heather Davila [id number] May 10, 1960 58 y.o.  Virtual Visit via Telephone Note  I connected with pt on 03/10/19 at  4:00 PM EST by telephone and verified that I am speaking with the correct person using two identifiers.   I discussed the limitations, risks, security and privacy concerns of performing an evaluation and management service by telephone and the availability of in person appointments. I also discussed with the patient that there may be a patient responsible charge related to this service. The patient expressed understanding and agreed to proceed.   I discussed the assessment and treatment plan with the patient. The patient was provided an opportunity to ask questions and all were answered. The patient agreed with the plan and demonstrated an understanding of the instructions.   The patient was advised to call back or seek an in-person evaluation if the symptoms worsen or if the condition fails to improve as anticipated.  I provided 20 minutes of non-face-to-face time during this encounter.  The patient was located at home.  The provider was located at [facility].   [name], PMHNP   Subjective:   Patient ID:  Heather Davila is a 58 y.o. (DOB [birth year]) female.  Chief Complaint:  Chief Complaint  Patient presents with  . Follow-up    Depression, Anxiety, Insomnia.    HPI Heather Davila presents for follow-up of anxiety and depression. She reports "everything is fine. I am doing well on the medications I am on." She reports that her mood has been stable and denies depressed mood. She reports that her anxiety has been well controlled.   She reports that she has some difficulty with sleep since menopause and that Klonopin has been effective. She reports that sleep is "ok, but I was sleeping better." For the last couple of weeks she has been waking up after 5 hours and then is able to return to sleep. She is unsure what may have triggered insomnia. Denies  nightmares or vivid dreams. Appetite has been good. Energy and motivation have been good. Denies impaired concentration. Denies SI.   She is working as a Government social research officer for [employer].    Past Psychiatric Medication Trials: Effexor XR- Effective, well tolerated. Has taken for about 6 months.  Brintellix - Initially effective and then was not effective when re-started Trintellix Paxil- Ineffective Cymbalta- Ineffective Wellbutrin XL Abilify- Took briefly. Did not like.  Klonopin- Effective Temazepam- Took during menopause Lunesta- Ineffective Ambien-Ineffective Sonata- Ineffective Trazodone- Ineffective Adderall- Took once and had adverse effect. Lamictal- May have had an adverse effects.  Review of Systems:  Review of Systems  Musculoskeletal: Negative for gait problem.  Neurological: Negative for tremors and headaches.  Psychiatric/Behavioral:       Please refer to HPI    Medications: I have reviewed the patient's current medications.  Current Outpatient Medications  Medication Sig Dispense Refill  . ibuprofen (ADVIL,MOTRIN) 200 MG tablet Take 200 mg by mouth every 6 (six) hours as needed.    Marland Kitchen MAGNESIUM PO Take by mouth.    . valACYclovir (VALTREX) 500 MG tablet Take one twice daily for 3 days at onset of outbreak 45 tablet 12  . YUVAFEM 10 MCG TABS vaginal tablet INSERT 1 TABLET VAGINALLY TWICE WEEKLY 24 tablet 3  . [START ON 03/15/2019] clonazePAM (KLONOPIN) 0.5 MG tablet Take 1-1.5 tabs po QHS 40 tablet 5  . ORACEA 40 MG capsule     . venlafaxine XR (EFFEXOR-XR) 150 MG 24 hr capsule Take 1 capsule (150 mg total)  by mouth daily with breakfast. 90 capsule 1   No current facility-administered medications for this visit.     Medication Side Effects: None  Allergies:  Allergies  Allergen Reactions  . Ciprofloxacin     REACTION: hives  . Oxycodone-Acetaminophen     REACTION: hives    Past Medical History:  Diagnosis Date  . Anemia   . Anorexia nervosa teen   . DEPRESSION 08/09/2009  . Insomnia   . 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  . TRANSAMINASES, SERUM, ELEVATED 08/14/2009    Family History  Problem Relation Age of Onset  . Hypertension Mother   . Colon cancer Mother 78       colon rescection  . Heart disease Father 67       CABG62  . Stroke Father 60  . Heart disease Paternal Grandfather   . Heart attack Sister 62       no stents  . Depression Sister   . Heart failure Paternal Aunt   . Diabetes Paternal Uncle   . Esophageal cancer Neg Hx   . Rectal cancer Neg Hx   . Stomach cancer Neg Hx     Social History   Socioeconomic History  . Marital status: Married    Spouse name: [name]  . Number of children: 3  . Years of education: Master's  . Highest education level: Not on file  Occupational History  . Occupation: Optician, dispensing: [employer]  . Financial resource strain: Not on file  . Food insecurity    Worry: Not on file    Inability: Not on file  . Transportation needs    Medical: Not on file    Non-medical: Not on file  Tobacco Use  . Smoking status: Former Smoker    Packs/day: 0.50    Years: 5.00    Pack years: 2.50    Types: Cigarettes    Quit date: 07/10/1987    Years since quitting: 31.6  . Smokeless tobacco: Never Used  Substance and Sexual Activity  . Alcohol use: No  . Drug use: No  . Sexual activity: Yes    Partners: Male    Birth control/protection: Post-menopausal, Other-see comments    Comment: vasectomy  Lifestyle  . Physical activity    Days per week: Not on file    Minutes per session: Not on file  . Stress: Not on file  Relationships  . Social Herbalist on phone: Not on file    Gets together: Not on file    Attends religious service: Not on file    Active member of club or organization: Not on file    Attends meetings of clubs or organizations: Not on file    Relationship status: Not on file   . Intimate partner violence    Fear of current or ex partner: Not on file    Emotionally abused: Not on file    Physically abused: Not on file    Forced sexual activity: Not on file  Other Topics Concern  . Not on file  Social History Narrative   Patient is married [name]) and lives at home with her husband and two children.   Patient has three children.   Patient works at [employer].   Patient has a Master's degree.   Patient is left-handed.   Patient drinks 6 cups of coffee daily.  Past Medical History, Surgical history, Social history, and Family history were reviewed and updated as appropriate.   Please see review of systems for further details on the patient's review from today.   Objective:   Physical Exam:  LMP 12/27/2007 (Exact Date)   Physical Exam Neurological:     Mental Status: She is alert and oriented to person, place, and time.     Cranial Nerves: No dysarthria.  Psychiatric:        Attention and Perception: Attention normal.        Mood and Affect: Mood normal.        Speech: Speech normal.        Behavior: Behavior is cooperative.        Thought Content: Thought content normal. Thought content is not paranoid or delusional. Thought content does not include homicidal or suicidal ideation. Thought content does not include homicidal or suicidal plan.        Cognition and Memory: Cognition and memory normal.        Judgment: Judgment normal.     Lab Review:     Component Value Date/Time   NA 137 08/31/2018 0912   K 4.1 08/31/2018 0912   CL 98 08/31/2018 0912   CO2 32 08/31/2018 0912   GLUCOSE 95 08/31/2018 0912   BUN 20 08/31/2018 0912   CREATININE 0.59 08/31/2018 0912   CREATININE 0.62 10/26/2014 0953   CALCIUM 10.0 08/31/2018 0912   PROT 7.6 08/31/2018 0912   ALBUMIN 4.8 08/31/2018 0912   AST 27 08/31/2018 0912   ALT 32 08/31/2018 0912   ALKPHOS 75 08/31/2018 0912   BILITOT 0.5 08/31/2018 0912   GFRNONAA >90 05/01/2014 1645    GFRAA >90 05/01/2014 1645       Component Value Date/Time   WBC 3.8 (L) 08/31/2018 0912   RBC 4.40 08/31/2018 0912   HGB 14.6 08/31/2018 0912   HGB 14.1 10/26/2014 1034   HCT 42.1 08/31/2018 0912   PLT 230.0 08/31/2018 0912   MCV 95.6 08/31/2018 0912   MCH 32.6 05/01/2014 1645   MCHC 34.7 08/31/2018 0912   RDW 12.3 08/31/2018 0912   LYMPHSABS 1.2 08/31/2018 0912   MONOABS 0.3 08/31/2018 0912   EOSABS 0.2 08/31/2018 0912   BASOSABS 0.0 08/31/2018 0912    No results found for: POCLITH, LITHIUM   No results found for: PHENYTOIN, PHENOBARB, VALPROATE, CBMZ   .res Assessment: Plan:   Discussed that patient may wish to increase Klonopin to 0.5 mg 1-1/2 tabs p.o. nightly for 3-5 consecutive nights when she experiences occasional cycles of insomnia and then resume usual dose of 0.5 mg p.o. nightly.  Advised patient to contact office if insomnia worsens or does not improve. Will continue Effexor XR 150 mg daily for depression and anxiety. Patient to follow-up in 6 months or sooner if clinically indicated. Patient advised to contact office with any questions, adverse effects, or acute worsening in signs and symptoms.  Heather Davila was seen today for follow-up.  Diagnoses and all orders for this visit:  Recurrent major depressive disorder, in full remission ([place]) -     venlafaxine XR (EFFEXOR-XR) 150 MG 24 hr capsule; Take 1 capsule (150 mg total) by mouth daily with breakfast.  Anxiety -     venlafaxine XR (EFFEXOR-XR) 150 MG 24 hr capsule; Take 1 capsule (150 mg total) by mouth daily with breakfast. -     clonazePAM (KLONOPIN) 0.5 MG tablet; Take 1-1.5 tabs po QHS    Please see After  Visit Summary for patient specific instructions.  Future Appointments  Date Time Provider [place]  04/04/2019  3:40 PM Milus Banister, MD [facility]  09/08/2019  4:00 PM [name], PMHNP [facility] None  11/11/2019  1:30 PM Regina Eck, CNM [facility] None    No orders of the  defined types were placed in this encounter.     -------------------------------

## 2019-04-04 ENCOUNTER — Ambulatory Visit: Payer: BLUE CROSS/BLUE SHIELD | Admitting: Gastroenterology

## 2019-04-04 ENCOUNTER — Encounter: Payer: Self-pay | Admitting: Gastroenterology

## 2019-04-04 VITALS — BP 94/60 | HR 60 | Temp 97.1°F | Ht 60.0 in | Wt 103.4 lb

## 2019-04-04 DIAGNOSIS — R05 Cough: Secondary | ICD-10-CM

## 2019-04-04 DIAGNOSIS — K219 Gastro-esophageal reflux disease without esophagitis: Secondary | ICD-10-CM

## 2019-04-04 DIAGNOSIS — R053 Chronic cough: Secondary | ICD-10-CM

## 2019-04-04 MED ORDER — OMEPRAZOLE 40 MG PO CPDR: 40.0000 mg | DELAYED_RELEASE_CAPSULE | Freq: Every day | ORAL | 3 refills | Status: DC

## 2019-04-04 NOTE — Patient Instructions (Signed)
We have sent the following medications to your pharmacy for you to pick up at your convenience: Omeprazole   Referral has been sent to Pulmonary for chronic cough. Their office will contact you with appointment.   If you are age 57 or younger, your body mass index should be between 19-25. Your Body mass index is 20.19 kg/m. If this is out of the aformentioned range listed, please consider follow up with your Primary Care Provider.   Thank you for choosing me and [facility].  Dr. Ardis Hughs

## 2019-04-04 NOTE — Progress Notes (Signed)
Review of pertinent gastrointestinal problems: 1. Elevated liver tests (chronic, at least for 5-10 years by time of formal evaluation 07-30-2014): imaging [place] and MRI 30-Jul-2014 were both essentially normal: Labs 2016/17: Hepatitis C antibody negative , ceruloplasmin normal, alpha-1 antitrypsin level normal , ANA negative, AMA negative, Iron studies normal Liver tests since July 29, 2009; she is immune to hepatitis B, anti-smooth muscle antibody negative, celiac sprue testing negative, hepatitis A serology not immune, INR normal: AST and ALT have been intermittently elevated dating back at least to 07-29-09 and they were 40 and 70 respectively. Most recently ALT and AST in the 1-200 range. The remaining liver tests have all been normal persistently.  LFTs have trended to nearly normal by April 2017  LFTs normal May 2020 2. Coughing when she eats: Especially with apples, carrots and pretzels.  May 2020 barium esophagram was normal.  May 2020 modified swallow study was normal as well.  EGD June 2020 showed significant Candida esophagitis.  The examination was otherwise normal.  She was put on Diflucan.  Symptoms did not improved after Diflucan therapy, she was put on proton pump inhibitor once daily and referred to pulmonary for chronic cough.  She declined PPI and pulmonary referral.  Eventual repeat 10-day course of Diflucan was again unhelpful for her cough 3.  Precancerous colon polyps.  Colonoscopy June 2020 removed 1.4 cm sessile serrated adenoma.  3-year recall recommended   EGD, June 2013, Dr. Collene Mares, indication dysphasia and acid reflux.  Findings "linear folds in the esophagus biopsy x3 to rule out eosinophilic esophagitis.  Antral erosions.  Otherwise normal EGD."  Pathology showed "unremarkable squamous mucosa".   HPI: This is a very pleasant 58 year old woman whom I last saw the time of the EGD about 6 months ago.  At that time I diagnosed with Candida esophagitis and felt that this might be contributing to her  unusual coughing that only occur when she would eat hard foods.  210-day courses of Diflucan have failed to make any improvement in her cough.  Interestingly she never had any dysphagia or odynophagia with the Candida esophagitis.  She continues to have cough only when she eats hard foods.  Soft foods do not cause her to have a cough at all.  She does not have heartburn, she has no dysphagia.  She has no O. Dino aphasia.    ROS: complete GI ROS as described in HPI, all other review negative.  Constitutional:  No unintentional weight loss   Past Medical History:  Diagnosis Date  . Anemia   . Anorexia nervosa teen  . DEPRESSION 08/09/2009  . Insomnia   . 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  . TRANSAMINASES, SERUM, ELEVATED 08/14/2009    Past Surgical History:  Procedure Laterality Date  . BREAST ENHANCEMENT SURGERY Bilateral   . CESAREAN SECTION     X 3  . DILITATION & CURRETTAGE/HYSTROSCOPY WITH HYDROTHERMAL ABLATION    . ENDOMETRIAL ABLATION  12/27/07  . HYSTEROSCOPY  12/27/07   and HTA    Current Outpatient Medications  Medication Sig Dispense Refill  . clonazePAM (KLONOPIN) 0.5 MG tablet Take 1-1.5 tabs po QHS 40 tablet 5  . ibuprofen (ADVIL,MOTRIN) 200 MG tablet Take 200 mg by mouth every 6 (six) hours as needed.    Marland Kitchen MAGNESIUM PO Take by mouth.    . ORACEA 40 MG capsule     . valACYclovir (VALTREX) 500 MG tablet Take one twice daily for 3  days at onset of outbreak 45 tablet 12  . venlafaxine XR (EFFEXOR-XR) 150 MG 24 hr capsule Take 1 capsule (150 mg total) by mouth daily with breakfast. 90 capsule 1  . YUVAFEM 10 MCG TABS vaginal tablet INSERT 1 TABLET VAGINALLY TWICE WEEKLY 24 tablet 3   No current facility-administered medications for this visit.     Allergies as of 04/04/2019 - Review Complete 04/04/2019  Allergen Reaction Noted  . Ciprofloxacin  08/09/2009  . Oxycodone-acetaminophen  08/09/2009    Family History   Problem Relation Age of Onset  . Hypertension Mother   . Colon cancer Mother 95       colon rescection  . Heart disease Father 47       CABG62  . Stroke Father 36  . Heart disease Paternal Grandfather   . Heart attack Sister 84       no stents  . Depression Sister   . Heart failure Paternal Aunt   . Diabetes Paternal Uncle   . Esophageal cancer Neg Hx   . Rectal cancer Neg Hx   . Stomach cancer Neg Hx     Social History   Socioeconomic History  . Marital status: Married    Spouse name: [name]  . Number of children: 3  . Years of education: Master's  . Highest education level: Not on file  Occupational History  . Occupation: Optician, dispensing: [employer]  . Financial resource strain: Not on file  . Food insecurity    Worry: Not on file    Inability: Not on file  . Transportation needs    Medical: Not on file    Non-medical: Not on file  Tobacco Use  . Smoking status: Former Smoker    Packs/day: 0.50    Years: 5.00    Pack years: 2.50    Types: Cigarettes    Quit date: 07/10/1987    Years since quitting: 31.7  . Smokeless tobacco: Never Used  Substance and Sexual Activity  . Alcohol use: No  . Drug use: No  . Sexual activity: Yes    Partners: Male    Birth control/protection: Post-menopausal, Other-see comments    Comment: vasectomy  Lifestyle  . Physical activity    Days per week: Not on file    Minutes per session: Not on file  . Stress: Not on file  Relationships  . Social Herbalist on phone: Not on file    Gets together: Not on file    Attends religious service: Not on file    Active member of club or organization: Not on file    Attends meetings of clubs or organizations: Not on file    Relationship status: Not on file  . Intimate partner violence    Fear of current or ex partner: Not on file    Emotionally abused: Not on file    Physically abused: Not on file    Forced sexual activity: Not on file   Other Topics Concern  . Not on file  Social History Narrative   Patient is married [name]) and lives at home with her husband and two children.   Patient has three children.   Patient works at [employer].   Patient has a Master's degree.   Patient is left-handed.   Patient drinks 6 cups of coffee daily.     Physical Exam: BP 94/60   Pulse 60   Temp (!) 97.1 F (  36.2 C)   Ht 5' (1.524 m)   Wt 103 lb 6.4 oz (46.9 kg)   LMP 12/27/2007 (Exact Date)   BMI 20.19 kg/m  Constitutional: generally well-appearing Psychiatric: alert and oriented x3 Abdomen: soft, nontender, nondistended, no obvious ascites, no peritoneal signs, normal bowel sounds No peripheral edema noted in lower extremities  Assessment and plan: 58 y.o. female with chronic cough  She has a very unusual cough symptom.  It only occurs when she eats hard foods.  She does not have dysphagia or odynophagia.  EGD remotely with another provider with biopsies of the esophagus showed no sign of eosinophilic esophagitis.  My EGD June 2020 showed Candida in her esophagus.  I thought that this might be contributing to some of her symptoms however 2 10-day treatments of Diflucan have failed to make any improvement in her chronic cough.  I recommended a trial of omeprazole 40 mg 1 pill once daily shortly for breakfast and evaluation with a pulmonologist.  I think if neither of those are helpful then repeat EGD will probably be in order, really to confirm whether she still has the yeast infection or not.  If she does have persistent Candida then I would likely biopsy it to see if she possibly has a Diflucan resistant strain.  Please see the "Patient Instructions" section for addition details about the plan.  Owens Loffler, MD [facility] 04/04/2019, 3:49 PM

## 2019-04-05 DIAGNOSIS — L280 Lichen simplex chronicus: Secondary | ICD-10-CM | POA: Diagnosis not present

## 2019-04-05 DIAGNOSIS — L700 Acne vulgaris: Secondary | ICD-10-CM | POA: Diagnosis not present

## 2019-05-03 ENCOUNTER — Institutional Professional Consult (permissible substitution): Payer: BLUE CROSS/BLUE SHIELD | Admitting: Internal Medicine

## 2019-05-04 ENCOUNTER — Institutional Professional Consult (permissible substitution): Payer: BLUE CROSS/BLUE SHIELD | Admitting: Internal Medicine

## 2019-05-06 ENCOUNTER — Institutional Professional Consult (permissible substitution): Payer: BLUE CROSS/BLUE SHIELD | Admitting: Internal Medicine

## 2019-06-09 ENCOUNTER — Telehealth: Payer: Self-pay | Admitting: Psychiatry

## 2019-06-09 DIAGNOSIS — F419 Anxiety disorder, unspecified: Secondary | ICD-10-CM

## 2019-06-09 DIAGNOSIS — F3342 Major depressive disorder, recurrent, in full remission: Secondary | ICD-10-CM

## 2019-06-09 MED ORDER — VENLAFAXINE HCL ER 37.5 MG PO CP24: 37.5000 mg | ORAL_CAPSULE | Freq: Every day | ORAL | 0 refills | Status: DC

## 2019-06-09 NOTE — Telephone Encounter (Addendum)
She is noticing that if she forgets to take Effexor XR she will experience some dizziness and now is experiencing this even if she does not miss a dose. She is also noticing some mild depressive s/s.   She denies any recent medication changes.  Plan: Will add Effexor XR 37.5 mg po qhs and continue Effexor XR 150 mg po q am to improve possible discontinuation s/s that are occurring prior to next scheduled dose. Increase in dose may also improve mild depressive s/s.

## 2019-06-09 NOTE — Telephone Encounter (Signed)
Sherrianne to report that she feels the Effexor XR is starting to wear off too soon.  Would like to discuss increasing it some.  Please call.  Next appt 09/08/19

## 2019-06-12 ENCOUNTER — Other Ambulatory Visit: Payer: Self-pay | Admitting: Psychiatry

## 2019-06-12 DIAGNOSIS — F419 Anxiety disorder, unspecified: Secondary | ICD-10-CM

## 2019-06-12 NOTE — Telephone Encounter (Signed)
Next apt 09/08/2019

## 2019-06-15 ENCOUNTER — Institutional Professional Consult (permissible substitution): Payer: BLUE CROSS/BLUE SHIELD | Admitting: Internal Medicine

## 2019-06-16 ENCOUNTER — Telehealth: Payer: Self-pay | Admitting: Psychiatry

## 2019-06-16 DIAGNOSIS — F419 Anxiety disorder, unspecified: Secondary | ICD-10-CM

## 2019-06-16 DIAGNOSIS — F3342 Major depressive disorder, recurrent, in full remission: Secondary | ICD-10-CM

## 2019-06-16 MED ORDER — VENLAFAXINE HCL ER 75 MG PO CP24: 75.0000 mg | ORAL_CAPSULE | Freq: Every day | ORAL | 0 refills | Status: DC

## 2019-06-16 NOTE — Telephone Encounter (Signed)
Reports that she has still not received Effexor XR 37.5 mg from mail order due to weather delays with shipping. Mail order indicates she should have shipment by Monday and recommended that she request limited supply from local pharmacy. Pt reports that she is continuing to have significant depressive s/s and has been having significant difficulty with concentration that is negatively affecting occupational functioning. She reports that on a few occasions she has taken 2 Effexor XR 150 mg capsules to try to improve s/s without tolerability issues. Discussed increasing Effexor XR to 225 mg po qd and will send in 10 day supply of Effexor XR 75 mg to take with 150 mg capsules that she currently has. Advised pt to take two 37.5 mg capsules once shipment arrives from mail order with 150 mg capsules. Advised pt to call back if s/s worsen or do not improve.

## 2019-06-21 ENCOUNTER — Other Ambulatory Visit: Payer: Self-pay

## 2019-06-21 DIAGNOSIS — F419 Anxiety disorder, unspecified: Secondary | ICD-10-CM

## 2019-06-21 MED ORDER — CLONAZEPAM 0.5 MG PO TABS: ORAL_TABLET | ORAL | 2 refills | Status: DC

## 2019-06-23 ENCOUNTER — Institutional Professional Consult (permissible substitution): Payer: BLUE CROSS/BLUE SHIELD | Admitting: Internal Medicine

## 2019-07-13 ENCOUNTER — Other Ambulatory Visit: Payer: Self-pay | Admitting: Psychiatry

## 2019-07-13 DIAGNOSIS — F419 Anxiety disorder, unspecified: Secondary | ICD-10-CM

## 2019-07-13 DIAGNOSIS — F3342 Major depressive disorder, recurrent, in full remission: Secondary | ICD-10-CM

## 2019-07-13 MED ORDER — VENLAFAXINE HCL ER 150 MG PO CP24: 300.0000 mg | ORAL_CAPSULE | Freq: Every day | ORAL | 1 refills | Status: DC

## 2019-07-13 NOTE — Telephone Encounter (Signed)
Pt called to report she has increased Effexor XR to 300 mg daily and is running out soon. Asking if you will send new Rx to [facility] for 300 mg daily for 90 day supply. Working well at 300 mg dose. Next appt 5/11

## 2019-07-18 ENCOUNTER — Encounter: Payer: Self-pay | Admitting: Certified Nurse Midwife

## 2019-07-18 ENCOUNTER — Ambulatory Visit: Payer: BLUE CROSS/BLUE SHIELD | Admitting: Family Medicine

## 2019-07-18 ENCOUNTER — Encounter: Payer: Self-pay | Admitting: Internal Medicine

## 2019-07-18 ENCOUNTER — Ambulatory Visit: Payer: BLUE CROSS/BLUE SHIELD | Admitting: Internal Medicine

## 2019-07-18 ENCOUNTER — Other Ambulatory Visit: Payer: Self-pay

## 2019-07-18 ENCOUNTER — Ambulatory Visit: Payer: Self-pay

## 2019-07-18 ENCOUNTER — Encounter: Payer: Self-pay | Admitting: Family Medicine

## 2019-07-18 VITALS — BP 93/60 | HR 77 | Temp 97.6°F | Ht 60.0 in | Wt 101.2 lb

## 2019-07-18 DIAGNOSIS — R053 Chronic cough: Secondary | ICD-10-CM

## 2019-07-18 DIAGNOSIS — K219 Gastro-esophageal reflux disease without esophagitis: Secondary | ICD-10-CM | POA: Diagnosis not present

## 2019-07-18 DIAGNOSIS — Z87891 Personal history of nicotine dependence: Secondary | ICD-10-CM | POA: Diagnosis not present

## 2019-07-18 DIAGNOSIS — M542 Cervicalgia: Secondary | ICD-10-CM | POA: Diagnosis not present

## 2019-07-18 DIAGNOSIS — R05 Cough: Secondary | ICD-10-CM

## 2019-07-18 NOTE — Progress Notes (Signed)
Heather Davila    [id number]    06-08-1960  Primary Care Physician:Burchette, Alinda Sierras, MD  Referring Physician: Milus Banister, MD [address],  [place] [address] Reason for Consultation: "GERD, Chronic Cough" Date of Consultation: 07/18/2019  Chief complaint:   Chief Complaint  Patient presents with  . Consult    cough when eating solid foods x 2 years; hx in esophagus     HPI: Heather Davila is a 60 y.o. woman who presents with chronic cough. Started 1-2 years ago she thinks after a trial of doxycycline capsule for acne. (might have started before, she isn't sure.)  Cough is only with eating.  Apples, raw carrots, hard foods make her cough. No coughing with liquids or soft foods (cottage cheese, oatmeal.) cough is dry. Denies regurgitation.  It is hard to swallow, but she does eventually get the food down. No overt heart burn. No nocturnal cough. No dyspnea, chest tightness wheezing. Cough is not provoked by talking for long periods of time, exercise.   No history of pneumonia or bronchitis. Youngest son has asthma. Middle son has reactive airways disease. No remitting factors for cough.  On the days she doesn't eat hard foods, no cough. Overall feels like cough symptoms are progressing. Treated twice for candidal esophagitis. No change in her cough after treatments. Never been prescribed any inhalers.   There was consideration of EoE as a diagnosis but no biopsies to confirm this.   Started on once daily PPI - took it for 3 weeks - no change in cough.   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  Exposures: Lives at home with husband and adult son with health condition. 2 dogs.  Smoking history: light, remote in high school. Passive smoke exposure in both parents.   Social History   Occupational History  . Occupation: Optician, dispensing: [employer]  Tobacco Use  .  Smoking status: Former Smoker    Packs/day: 0.50    Years: 5.00    Pack years: 2.50    Types: Cigarettes    Quit date: 07/10/1987    Years since quitting: 32.0  . Smokeless tobacco: Never Used  Substance and Sexual Activity  . Alcohol use: No  . Drug use: No  . Sexual activity: Yes    Partners: Male    Birth control/protection: Post-menopausal, Other-see comments    Comment: vasectomy    Relevant family history: Family History  Problem Relation Age of Onset  . Hypertension Mother   . Colon cancer Mother 51       colon rescection  . Heart disease Father 63       CABG62  . Stroke Father 6  . Heart disease Paternal Grandfather   . Heart attack Sister 59       no stents  . Depression Sister   . Heart failure Paternal Aunt   . Diabetes Paternal Uncle   . Esophageal cancer Neg Hx   . Rectal cancer Neg Hx   . Stomach cancer Neg Hx     Past Medical History:  Diagnosis Date  . Anemia   . Anorexia nervosa teen  . DEPRESSION 08/09/2009  . Insomnia   . 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  . TRANSAMINASES, SERUM, ELEVATED 08/14/2009    Past Surgical History:  Procedure Laterality Date  .  BREAST ENHANCEMENT SURGERY Bilateral   . CESAREAN SECTION     X 3  . DILITATION & CURRETTAGE/HYSTROSCOPY WITH HYDROTHERMAL ABLATION    . ENDOMETRIAL ABLATION  12/27/07  . HYSTEROSCOPY  12/27/07   and HTA     Review of systems: Review of Systems  Constitutional: Negative for chills, fever and weight loss.  HENT: Negative for congestion, sinus pain and sore throat.   Eyes: Negative for discharge and redness.  Respiratory: Positive for cough. Negative for hemoptysis, sputum production, shortness of breath and wheezing.   Cardiovascular: Negative for chest pain, palpitations and leg swelling.  Gastrointestinal: Negative for heartburn, nausea and vomiting.  Musculoskeletal: Negative for joint pain and myalgias.  Skin: Negative for rash.    Neurological: Negative for dizziness, tremors, focal weakness and headaches.  Endo/Heme/Allergies: Negative for environmental allergies.  Psychiatric/Behavioral: Negative for depression. The patient is not nervous/anxious.   All other systems reviewed and are negative.   Physical Exam: Blood pressure 93/60, pulse 77, temperature 97.6 F (36.4 C), temperature source Temporal, height 5' (1.524 m), weight 101 lb 3.2 oz (45.9 kg), last menstrual period 12/27/2007, SpO2 98 %. Gen:      No acute distress ENT:  no nasal polyps, mucus membranes moist, no thrush Lungs:    No increased respiratory effort, symmetric chest wall excursion, clear to auscultation bilaterally, no wheezes or crackles CV:         Regular rate and rhythm; no murmurs, rubs, or gallops.  No pedal edema Abd:      + bowel sounds; soft, non-tender; no distension MSK: no acute synovitis of DIP or PIP joints, no mechanics hands.  Skin:      Warm and dry; no rashes Neuro: normal speech, no focal facial asymmetry Psych: alert and oriented x3, normal mood and affect   Data Reviewed/Medical Decision Making:  Independent interpretation of tests: Imaging:  PFTs: None available for review  Labs:   Immunization status:  Immunization History  Administered Date(s) Administered  . Influenza,inj,quad, With Preservative 02/26/2017  . Moderna SARS-COVID-2 Vaccination 04/20/2019, 05/18/2019  . Td 07/28/2007  . Tdap 03/05/2018    . I reviewed prior external note(s) from SLP, Dr. Edison Nasuti . I reviewed the result(s) of the labs and imaging as noted above.  . I have ordered spirometry and FeNO   Assessment:  Chronic Cough Possible GERD Remote tobacco use disorder - in high school None in over 30 years.   Plan/Recommendations:  Heather Davila is a 59 year old woman with a history of chronic cough dating over 2 years which seems to be progressively getting worse.  Her symptoms are almost entirely isolated to eating hard foods and  her symptoms completely abate with avoidance of these foods.  I explained the rationale in treating chronic cough methodology and trial and error.  I also explained that a lot of what we do in medicine is ruling out medical conditions.  Her story is not very convincing for asthma, and her history of smoking is very remote so I think smoking-related lung disease is in exhalation for her chronic cough is less likely.  I do think it is reasonable to obtain some spirometry pre and postbronchodilator as well as an exhaled nitric oxide test.  An exhaled nitric oxide test may be elevated in patients with eosinophilic esophagitis that could be helpful.  She did have an SLP evaluation but unfortunately those harder foods were not tested at that time and this evaluation was unable to provoke her symptoms.  All  breathing testing does require a negative Covid test and patient is unsure if she wants to proceed at this time.  I am happy to see her back as needed should her symptoms change or she develop any new respiratory condition.   Return to Care: Return if symptoms worsen or fail to improve.  Heather Llamas, MD [facility] [place]  CC: Milus Banister, MD

## 2019-07-18 NOTE — Progress Notes (Signed)
Office Visit Note   Patient: Heather Davila           Date of Birth: 1960/09/26           MRN: [id number] Visit Date: 07/18/2019 Requested by: Eulas Post, MD [place],  [place] [id number] PCP: Eulas Post, MD  Subjective: Chief Complaint  Patient presents with  . Neck - Pain    Pain posterior neck x 1 month. NKI. No radiating pain. No headaches.    HPI: She is here with neck pain.  Symptoms started about a month ago, no injury.  She started noticing pain at the base of her neck especially when looking downward for longer periods.  She is left-hand dominant, denies any radicular symptoms.  She noticed a numbness sensation near the C7 level intermittently.  She is not taking anything for the pain.  She is never had injury to her neck in the past.  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               ROS:   All other systems were reviewed and are negative.  Objective: Vital Signs: LMP 12/27/2007 (Exact Date)   Physical Exam:  General:  Alert and oriented, in no acute distress. Pulm:  Breathing unlabored. Psy:  Normal mood, congruent affect. Skin: No rash Neck: She has full range of motion, negative Spurling's test.  She is tender near the C7 spinous process and in the paraspinous muscles.  She has a lot of tightness in the trapezius muscles.  Upper extremity strength and reflexes are normal.  Imaging: X-ray cervical spine: She has straightening of the cervical spine.  There is degenerative disc disease at C5-6 and uncovertebral DJD.  She also has facet arthropathy at C5-6 and C6-7.    Assessment & Plan: 1.  Neck pain probably related to facet arthropathy at C5-6 and C6-7.  She also has a component of myofascial pain. -She will try either physical therapy or chiropractic, I suggested 2 options on her way home from work. -If symptoms do not improve, could contemplate MRI scan. -Trial of glucosamine and turmeric.  She did not want any  prescription medications.     Procedures: No procedures performed  No notes on file     PMFS History: Patient Active Problem List   Diagnosis Date Noted  . Alopecia 09/03/2017  . 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 Thyroid dysfunction 09/03/2017  . Lack of libido 08/14/2017  . Menopausal symptom 08/14/2017  . Adult attention deficit hyperactivity disorder 08/12/2017  . 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 Hypersomnia, persistent 09/16/2013  . Herpes genitalis in women 08/16/2013    Class: Diagnosis of  . Elevated LFTs 04/12/2013  . Insomnia 04/12/2013  . TRANSAMINASES, SERUM, ELEVATED 08/14/2009  . DEPRESSION 08/09/2009   Past Medical History:  Diagnosis Date  . Anemia   . Anorexia nervosa teen  . DEPRESSION 08/09/2009  . Insomnia   . 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  . TRANSAMINASES, SERUM, ELEVATED 08/14/2009    Family History  Problem Relation Age of Onset  . Hypertension Mother   . Colon cancer Mother 58       colon rescection  . Heart disease Father 47       CABG62  . Stroke Father 51  . Heart disease Paternal Grandfather   . Heart attack Sister 68  no stents  . Depression Sister   . Heart failure Paternal Aunt   . Diabetes Paternal Uncle   . Esophageal cancer Neg Hx   . Rectal cancer Neg Hx   . Stomach cancer Neg Hx     Past Surgical History:  Procedure Laterality Date  . BREAST ENHANCEMENT SURGERY Bilateral   . CESAREAN SECTION     X 3  . DILITATION & CURRETTAGE/HYSTROSCOPY WITH HYDROTHERMAL ABLATION    . ENDOMETRIAL ABLATION  12/27/07  . HYSTEROSCOPY  12/27/07   and HTA   Social History   Occupational History  . Occupation: Optician, dispensing: [employer]  Tobacco Use  . Smoking status: Former Smoker    Packs/day: 0.50    Years: 5.00    Pack years: 2.50    Types: Cigarettes    Quit date: 07/10/1987    Years since quitting:  32.0  . Smokeless tobacco: Never Used  Substance and Sexual Activity  . Alcohol use: No  . Drug use: No  . Sexual activity: Yes    Partners: Male    Birth control/protection: Post-menopausal, Other-see comments    Comment: vasectomy

## 2019-07-18 NOTE — Patient Instructions (Signed)
Spirometry and FeNO

## 2019-07-27 DIAGNOSIS — M5384 Other specified dorsopathies, thoracic region: Secondary | ICD-10-CM | POA: Diagnosis not present

## 2019-07-27 DIAGNOSIS — M50322 Other cervical disc degeneration at C5-C6 level: Secondary | ICD-10-CM | POA: Diagnosis not present

## 2019-07-27 DIAGNOSIS — M9901 Segmental and somatic dysfunction of cervical region: Secondary | ICD-10-CM | POA: Diagnosis not present

## 2019-07-27 DIAGNOSIS — M9902 Segmental and somatic dysfunction of thoracic region: Secondary | ICD-10-CM | POA: Diagnosis not present

## 2019-07-28 DIAGNOSIS — M9902 Segmental and somatic dysfunction of thoracic region: Secondary | ICD-10-CM | POA: Diagnosis not present

## 2019-07-28 DIAGNOSIS — M9901 Segmental and somatic dysfunction of cervical region: Secondary | ICD-10-CM | POA: Diagnosis not present

## 2019-07-28 DIAGNOSIS — M5384 Other specified dorsopathies, thoracic region: Secondary | ICD-10-CM | POA: Diagnosis not present

## 2019-07-28 DIAGNOSIS — M50322 Other cervical disc degeneration at C5-C6 level: Secondary | ICD-10-CM | POA: Diagnosis not present

## 2019-08-01 DIAGNOSIS — M50322 Other cervical disc degeneration at C5-C6 level: Secondary | ICD-10-CM | POA: Diagnosis not present

## 2019-08-01 DIAGNOSIS — M9902 Segmental and somatic dysfunction of thoracic region: Secondary | ICD-10-CM | POA: Diagnosis not present

## 2019-08-01 DIAGNOSIS — M5384 Other specified dorsopathies, thoracic region: Secondary | ICD-10-CM | POA: Diagnosis not present

## 2019-08-01 DIAGNOSIS — M9901 Segmental and somatic dysfunction of cervical region: Secondary | ICD-10-CM | POA: Diagnosis not present

## 2019-08-02 DIAGNOSIS — M9902 Segmental and somatic dysfunction of thoracic region: Secondary | ICD-10-CM | POA: Diagnosis not present

## 2019-08-02 DIAGNOSIS — M9901 Segmental and somatic dysfunction of cervical region: Secondary | ICD-10-CM | POA: Diagnosis not present

## 2019-08-02 DIAGNOSIS — M5384 Other specified dorsopathies, thoracic region: Secondary | ICD-10-CM | POA: Diagnosis not present

## 2019-08-02 DIAGNOSIS — M50322 Other cervical disc degeneration at C5-C6 level: Secondary | ICD-10-CM | POA: Diagnosis not present

## 2019-08-03 DIAGNOSIS — M50322 Other cervical disc degeneration at C5-C6 level: Secondary | ICD-10-CM | POA: Diagnosis not present

## 2019-08-03 DIAGNOSIS — M9901 Segmental and somatic dysfunction of cervical region: Secondary | ICD-10-CM | POA: Diagnosis not present

## 2019-08-03 DIAGNOSIS — M5384 Other specified dorsopathies, thoracic region: Secondary | ICD-10-CM | POA: Diagnosis not present

## 2019-08-03 DIAGNOSIS — M9902 Segmental and somatic dysfunction of thoracic region: Secondary | ICD-10-CM | POA: Diagnosis not present

## 2019-08-15 ENCOUNTER — Encounter: Payer: Self-pay | Admitting: Family Medicine

## 2019-08-15 DIAGNOSIS — M9901 Segmental and somatic dysfunction of cervical region: Secondary | ICD-10-CM | POA: Diagnosis not present

## 2019-08-15 DIAGNOSIS — M50322 Other cervical disc degeneration at C5-C6 level: Secondary | ICD-10-CM | POA: Diagnosis not present

## 2019-08-15 DIAGNOSIS — M9902 Segmental and somatic dysfunction of thoracic region: Secondary | ICD-10-CM | POA: Diagnosis not present

## 2019-08-15 DIAGNOSIS — M5384 Other specified dorsopathies, thoracic region: Secondary | ICD-10-CM | POA: Diagnosis not present

## 2019-08-15 DIAGNOSIS — M542 Cervicalgia: Secondary | ICD-10-CM

## 2019-08-16 ENCOUNTER — Ambulatory Visit: Payer: BLUE CROSS/BLUE SHIELD | Admitting: Physical Therapy

## 2019-08-16 DIAGNOSIS — M50322 Other cervical disc degeneration at C5-C6 level: Secondary | ICD-10-CM | POA: Diagnosis not present

## 2019-08-16 DIAGNOSIS — M9901 Segmental and somatic dysfunction of cervical region: Secondary | ICD-10-CM | POA: Diagnosis not present

## 2019-08-16 DIAGNOSIS — M5384 Other specified dorsopathies, thoracic region: Secondary | ICD-10-CM | POA: Diagnosis not present

## 2019-08-16 DIAGNOSIS — M9902 Segmental and somatic dysfunction of thoracic region: Secondary | ICD-10-CM | POA: Diagnosis not present

## 2019-08-17 DIAGNOSIS — M9902 Segmental and somatic dysfunction of thoracic region: Secondary | ICD-10-CM | POA: Diagnosis not present

## 2019-08-17 DIAGNOSIS — M50322 Other cervical disc degeneration at C5-C6 level: Secondary | ICD-10-CM | POA: Diagnosis not present

## 2019-08-17 DIAGNOSIS — M5384 Other specified dorsopathies, thoracic region: Secondary | ICD-10-CM | POA: Diagnosis not present

## 2019-08-17 DIAGNOSIS — M9901 Segmental and somatic dysfunction of cervical region: Secondary | ICD-10-CM | POA: Diagnosis not present

## 2019-08-22 ENCOUNTER — Encounter: Payer: BLUE CROSS/BLUE SHIELD | Admitting: Physical Therapy

## 2019-08-22 DIAGNOSIS — M9901 Segmental and somatic dysfunction of cervical region: Secondary | ICD-10-CM | POA: Diagnosis not present

## 2019-08-22 DIAGNOSIS — M9902 Segmental and somatic dysfunction of thoracic region: Secondary | ICD-10-CM | POA: Diagnosis not present

## 2019-08-22 DIAGNOSIS — M5384 Other specified dorsopathies, thoracic region: Secondary | ICD-10-CM | POA: Diagnosis not present

## 2019-08-22 DIAGNOSIS — M50322 Other cervical disc degeneration at C5-C6 level: Secondary | ICD-10-CM | POA: Diagnosis not present

## 2019-08-26 DIAGNOSIS — M47812 Spondylosis without myelopathy or radiculopathy, cervical region: Secondary | ICD-10-CM | POA: Diagnosis not present

## 2019-08-26 DIAGNOSIS — M5489 Other dorsalgia: Secondary | ICD-10-CM | POA: Diagnosis not present

## 2019-08-26 DIAGNOSIS — M7918 Myalgia, other site: Secondary | ICD-10-CM | POA: Diagnosis not present

## 2019-08-26 DIAGNOSIS — M5030 Other cervical disc degeneration, unspecified cervical region: Secondary | ICD-10-CM | POA: Diagnosis not present

## 2019-08-29 DIAGNOSIS — M5489 Other dorsalgia: Secondary | ICD-10-CM | POA: Diagnosis not present

## 2019-08-29 DIAGNOSIS — M7918 Myalgia, other site: Secondary | ICD-10-CM | POA: Diagnosis not present

## 2019-08-29 DIAGNOSIS — M5030 Other cervical disc degeneration, unspecified cervical region: Secondary | ICD-10-CM | POA: Diagnosis not present

## 2019-08-29 DIAGNOSIS — M47812 Spondylosis without myelopathy or radiculopathy, cervical region: Secondary | ICD-10-CM | POA: Diagnosis not present

## 2019-09-08 ENCOUNTER — Telehealth (INDEPENDENT_AMBULATORY_CARE_PROVIDER_SITE_OTHER): Payer: BLUE CROSS/BLUE SHIELD | Admitting: Psychiatry

## 2019-09-08 ENCOUNTER — Telehealth: Payer: Self-pay | Admitting: Psychiatry

## 2019-09-08 ENCOUNTER — Encounter: Payer: Self-pay | Admitting: Psychiatry

## 2019-09-08 VITALS — BP 110/60 | HR 60

## 2019-09-08 DIAGNOSIS — F3342 Major depressive disorder, recurrent, in full remission: Secondary | ICD-10-CM

## 2019-09-08 DIAGNOSIS — G4700 Insomnia, unspecified: Secondary | ICD-10-CM

## 2019-09-08 DIAGNOSIS — F419 Anxiety disorder, unspecified: Secondary | ICD-10-CM

## 2019-09-08 MED ORDER — CLONAZEPAM 0.5 MG PO TABS: ORAL_TABLET | ORAL | 5 refills | Status: DC

## 2019-09-08 MED ORDER — VENLAFAXINE HCL ER 150 MG PO CP24: 300.0000 mg | ORAL_CAPSULE | Freq: Every day | ORAL | 1 refills | Status: DC

## 2019-09-08 NOTE — Telephone Encounter (Signed)
Heather Davila, Heather Davila are scheduled for a virtual visit with your provider today.    Just as we do with appointments in the office, we must obtain your consent to participate.  Your consent will be active for this visit and any virtual visit you may have with one of our providers in the next 365 days.    If you have a MyChart account, I can also send a copy of this consent to you electronically.  All virtual visits are billed to your insurance company just like a traditional visit in the office.  As this is a virtual visit, video technology does not allow for your provider to perform a traditional examination.  This may limit your provider's ability to fully assess your condition.  If your provider identifies any concerns that need to be evaluated in person or the need to arrange testing such as labs, EKG, etc, we will make arrangements to do so.    Although advances in technology are sophisticated, we cannot ensure that it will always work on either your end or our end.  If the connection with a video visit is poor, we may have to switch to a telephone visit.  With either a video or telephone visit, we are not always able to ensure that we have a secure connection.   I need to obtain your verbal consent now.   Are you willing to proceed with your visit today?   Heather Davila has provided verbal consent on 09/08/2019 for a virtual visit (video or telephone).   [name], PMHNP 09/08/2019  4:21 PM

## 2019-09-08 NOTE — Progress Notes (Signed)
Birda Frye [id number] Nov 15, 1960 59 y.o.  Virtual Visit via Video Note  I connected with pt @ on 09/08/19 at  4:00 PM EDT by a video enabled telemedicine application and verified that I am speaking with the correct person using two identifiers.   I discussed the limitations of evaluation and management by telemedicine and the availability of in person appointments. The patient expressed understanding and agreed to proceed.  I discussed the assessment and treatment plan with the patient. The patient was provided an opportunity to ask questions and all were answered. The patient agreed with the plan and demonstrated an understanding of the instructions.   The patient was advised to call back or seek an in-person evaluation if the symptoms worsen or if the condition fails to improve as anticipated.  I provided 25 minutes of non-face-to-face time during this encounter.  The patient was located in her vehicle.  The provider was located at [place].   [name], PMHNP   Subjective:   Patient ID:  Heather Davila is a 59 y.o. (DOB 01/19/1961) female.  Chief Complaint:  Chief Complaint  Patient presents with  . Follow-up    Depression, Anxiety    HPI Heather Davila presents for follow-up of depression and insomnia. She reports that she is now taking Effexor XR 300 mg po qd and that her mood improved after increase in dose. She reports that she does not have any persistent depression. She reports that her anxiety has been ok. She has been sleeping well with Klonopin. Estimates sleeping about 7 hours a night. Appetite has been good. Energy and motivation have been good. Concentration has been adequate. She noticed worsening concentration when her depression worsened. Denies anhedonia and is enjoying the warmer weather. Denies SI.   She reports that her work has been going well and has been busy. Will have some time off when school ends June 10th. Will probably travel to [place] to  see new grandchild. Husband planning to retire in 39 months.   Past Psychiatric Medication Trials: Effexor XR- Effective, well tolerated. Has taken for about 6 months.  Brintellix - Initially effective and then was not effective when re-started Trintellix Paxil- Ineffective Cymbalta- Ineffective Wellbutrin XL Abilify- Took briefly. Did not like.  Klonopin- Effective Temazepam- Took during menopause Lunesta- Ineffective Ambien-Ineffective Sonata- Ineffective Trazodone- Ineffective Adderall- Took once and had adverse effect. Lamictal- May have had an adverse effects.   Review of Systems:  Review of Systems  Gastrointestinal: Negative.   Musculoskeletal: Negative for gait problem.  Neurological: Negative for tremors and headaches.  Psychiatric/Behavioral:       Please refer to HPI   Started PT for neck pain.  Medications: I have reviewed the patient's current medications.  Current Outpatient Medications  Medication Sig Dispense Refill  . clonazePAM (KLONOPIN) 0.5 MG tablet TAKE 1 TABLET BY MOUTH EVERY DAY AS NEEDED FOR ANXIETY 30 tablet 5  . ibuprofen (ADVIL,MOTRIN) 200 MG tablet Take 200 mg by mouth every 6 (six) hours as needed.    Marland Kitchen MAGNESIUM PO Take by mouth.    . ORACEA 40 MG capsule     . valACYclovir (VALTREX) 500 MG tablet Take one twice daily for 3 days at onset of outbreak 45 tablet 12  . venlafaxine XR (EFFEXOR-XR) 150 MG 24 hr capsule Take 2 capsules (300 mg total) by mouth daily with breakfast. 180 capsule 1  . YUVAFEM 10 MCG TABS vaginal tablet INSERT 1 TABLET VAGINALLY TWICE WEEKLY 24 tablet 3   No  current facility-administered medications for this visit.    Medication Side Effects: None  Allergies:  Allergies  Allergen Reactions  . Ciprofloxacin     REACTION: hives  . Oxycodone-Acetaminophen     REACTION: hives    Past Medical History:  Diagnosis Date  . Anemia   . Anorexia nervosa teen  . DEPRESSION 08/09/2009  . Insomnia   . 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  . TRANSAMINASES, SERUM, ELEVATED 08/14/2009    Family History  Problem Relation Age of Onset  . Hypertension Mother   . Colon cancer Mother 4       colon rescection  . Heart disease Father 36       CABG62  . Stroke Father 89  . Heart disease Paternal Grandfather   . Heart attack Sister 84       no stents  . Depression Sister   . Heart failure Paternal Aunt   . Diabetes Paternal Uncle   . Esophageal cancer Neg Hx   . Rectal cancer Neg Hx   . Stomach cancer Neg Hx     Social History   Socioeconomic History  . Marital status: Married    Spouse name: [name]  . Number of children: 3  . Years of education: Master's  . Highest education level: Not on file  Occupational History  . Occupation: Optician, dispensing: [employer]  Tobacco Use  . Smoking status: Former Smoker    Packs/day: 0.50    Years: 5.00    Pack years: 2.50    Types: Cigarettes    Quit date: 07/10/1987    Years since quitting: 32.1  . Smokeless tobacco: Never Used  Substance and Sexual Activity  . Alcohol use: No  . Drug use: No  . Sexual activity: Yes    Partners: Male    Birth control/protection: Post-menopausal, Other-see comments    Comment: vasectomy  Other Topics Concern  . Not on file  Social History Narrative   Patient is married [name]) and lives at home with her husband and two children.   Patient has three children.   Patient works at [employer].   Patient has a Master's degree.   Patient is left-handed.   Patient drinks 6 cups of coffee daily.   Social Determinants of Health   Financial Resource Strain:   . Difficulty of Paying Living Expenses:   Food Insecurity:   . Worried About Charity fundraiser in the Last Year:   . Arboriculturist in the Last Year:   Transportation Needs:   . Film/video editor (Medical):   Marland Kitchen Lack of Transportation (Non-Medical):   Physical Activity:   .  Days of Exercise per Week:   . Minutes of Exercise per Session:   Stress:   . Feeling of Stress :   Social Connections:   . Frequency of Communication with Friends and Family:   . Frequency of Social Gatherings with Friends and Family:   . Attends Religious Services:   . Active Member of Clubs or Organizations:   . Attends Archivist Meetings:   Marland Kitchen Marital Status:   Intimate Partner Violence:   . Fear of Current or Ex-Partner:   . Emotionally Abused:   Marland Kitchen Physically Abused:   . Sexually Abused:     Past Medical History, Surgical history, Social history, and Family history were reviewed and updated as appropriate.  Please see review of systems for further details on the patient's review from today.   Objective:   Physical Exam:  BP 110/60   Pulse 60   LMP 12/27/2007 (Exact Date)   Physical Exam Neurological:     Mental Status: She is alert and oriented to person, place, and time.     Cranial Nerves: No dysarthria.  Psychiatric:        Attention and Perception: Attention and perception normal.        Mood and Affect: Mood normal.        Speech: Speech normal.        Behavior: Behavior is cooperative.        Thought Content: Thought content normal. Thought content is not paranoid or delusional. Thought content does not include homicidal or suicidal ideation. Thought content does not include homicidal or suicidal plan.        Cognition and Memory: Cognition and memory normal.        Judgment: Judgment normal.     Comments: Insight intact     Lab Review:     Component Value Date/Time   NA 137 08/31/2018 0912   K 4.1 08/31/2018 0912   CL 98 08/31/2018 0912   CO2 32 08/31/2018 0912   GLUCOSE 95 08/31/2018 0912   BUN 20 08/31/2018 0912   CREATININE 0.59 08/31/2018 0912   CREATININE 0.62 10/26/2014 0953   CALCIUM 10.0 08/31/2018 0912   PROT 7.6 08/31/2018 0912   ALBUMIN 4.8 08/31/2018 0912   AST 27 08/31/2018 0912   ALT 32 08/31/2018 0912   ALKPHOS 75  08/31/2018 0912   BILITOT 0.5 08/31/2018 0912   GFRNONAA >90 05/01/2014 1645   GFRAA >90 05/01/2014 1645       Component Value Date/Time   WBC 3.8 (L) 08/31/2018 0912   RBC 4.40 08/31/2018 0912   HGB 14.6 08/31/2018 0912   HGB 14.1 10/26/2014 1034   HCT 42.1 08/31/2018 0912   PLT 230.0 08/31/2018 0912   MCV 95.6 08/31/2018 0912   MCH 32.6 05/01/2014 1645   MCHC 34.7 08/31/2018 0912   RDW 12.3 08/31/2018 0912   LYMPHSABS 1.2 08/31/2018 0912   MONOABS 0.3 08/31/2018 0912   EOSABS 0.2 08/31/2018 0912   BASOSABS 0.0 08/31/2018 0912    No results found for: POCLITH, LITHIUM   No results found for: PHENYTOIN, PHENOBARB, VALPROATE, CBMZ   .res Assessment: Plan:   Will continue Effexor XR 300 mg po qd for depression and anxiety since increased dose has been helpful for mood and anxiety.  Continue Klonopin 0.5 mg po qd prn anxiety. Pt to f/u in 6 months or sooner if clinically indicated.  Patient advised to contact office with any questions, adverse effects, or acute worsening in signs and symptoms.  Leveda was seen today for follow-up.  Diagnoses and all orders for this visit:  Recurrent major depressive disorder, in full remission ([place]) -     venlafaxine XR (EFFEXOR-XR) 150 MG 24 hr capsule; Take 2 capsules (300 mg total) by mouth daily with breakfast.  Anxiety -     venlafaxine XR (EFFEXOR-XR) 150 MG 24 hr capsule; Take 2 capsules (300 mg total) by mouth daily with breakfast. -     clonazePAM (KLONOPIN) 0.5 MG tablet; TAKE 1 TABLET BY MOUTH EVERY DAY AS NEEDED FOR ANXIETY     Please see After Visit Summary for patient specific instructions.  Future Appointments  Date Time Provider [place]  11/15/2019  1:00 PM Hale Bogus  S, MD [place] None    No orders of the defined types were placed in this encounter.     -------------------------------

## 2019-09-13 DIAGNOSIS — M7918 Myalgia, other site: Secondary | ICD-10-CM | POA: Diagnosis not present

## 2019-09-13 DIAGNOSIS — M5030 Other cervical disc degeneration, unspecified cervical region: Secondary | ICD-10-CM | POA: Diagnosis not present

## 2019-09-13 DIAGNOSIS — M5489 Other dorsalgia: Secondary | ICD-10-CM | POA: Diagnosis not present

## 2019-09-13 DIAGNOSIS — M47812 Spondylosis without myelopathy or radiculopathy, cervical region: Secondary | ICD-10-CM | POA: Diagnosis not present

## 2019-09-15 ENCOUNTER — Telehealth: Payer: Self-pay

## 2019-09-15 ENCOUNTER — Other Ambulatory Visit: Payer: Self-pay

## 2019-09-15 DIAGNOSIS — F419 Anxiety disorder, unspecified: Secondary | ICD-10-CM

## 2019-09-15 DIAGNOSIS — F3342 Major depressive disorder, recurrent, in full remission: Secondary | ICD-10-CM

## 2019-09-15 MED ORDER — VENLAFAXINE HCL ER 150 MG PO CP24: 300.0000 mg | ORAL_CAPSULE | Freq: Every day | ORAL | 1 refills | Status: DC

## 2019-09-15 NOTE — Telephone Encounter (Signed)
Patient called office yesterday about her medication Venlafaxine ER 150 mg take 2 daily, #180/90 day needing a prior authorization. I contacted her insurance [employer] and they informed me the patient is NOT covered for PHARMACY benefits, she only had commercial MEDICAL. They advised me to have her contact member services to see how she can add that to her plan.    I checked [facility] and she can get #180 with Heather Davila for $26.00.  LM with patient to call back

## 2019-09-15 NOTE — Telephone Encounter (Signed)
Patient called back and said she has her pharmacy benefits through a different plan that we did not have on file. This is through her husband she said. Given information, will submit with cover my meds again with updated insurance information.  [id number] RXBIN [id number] GRP#[id number]

## 2019-09-15 NOTE — Telephone Encounter (Signed)
Tried to submit a prior authorization for patient's VENLAFAXINE ER 150 mg through cover my med's, they responded back with medication is already covered on her plan. Contacted [facility] directly and they said yes it's on her plan and covered but due to quantity it needs a prior authorization. Gave representative information and she did approve it over the phone.   Effective 08/16/2019-09/14/2022 PA# [id number]

## 2019-09-20 ENCOUNTER — Encounter: Payer: Self-pay | Admitting: Gastroenterology

## 2019-09-20 NOTE — Telephone Encounter (Signed)
Pt scheduled for EGD at [facility] on 6/30 at 10:00am and [facility] on 6/16.

## 2019-09-21 ENCOUNTER — Telehealth: Payer: Self-pay | Admitting: Psychiatry

## 2019-09-21 NOTE — Telephone Encounter (Signed)
Contacted [facility] at [phone], they informed me patient received #90 delivered by [facility] on 09/14/2019. Due to her dose change they will ship her #180 on 10/21/2019. She therefore has enough for 45 days taking 2 of her Venlafaxine XR 150 mg capsules as prescribed.

## 2019-09-21 NOTE — Telephone Encounter (Signed)
LM with detailed information and if she did not receive #90 on 09/14/2019 contact [facility] and let us know. They show it was delivered to her address on file.

## 2019-09-21 NOTE — Telephone Encounter (Signed)
Patient's PA has been approved, not sure if patient needs to speak to someone directly at [facility], receiving texts are not always accurate.  I tried to reach them but never got to a live person then got disconnected, will try back again. This may be the issue the patient is also having.

## 2019-09-21 NOTE — Telephone Encounter (Signed)
Heather Davila called back today to report that the pharmacy will not fill her effexor.  She got a text from them saying it was too soon to fill.  She doesn't understand that.  She thought everything was straightened out.  Is this correct or is it still the PA?  Please let her know the status.

## 2019-09-22 DIAGNOSIS — M7918 Myalgia, other site: Secondary | ICD-10-CM | POA: Diagnosis not present

## 2019-09-22 DIAGNOSIS — M79605 Pain in left leg: Secondary | ICD-10-CM | POA: Diagnosis not present

## 2019-09-22 DIAGNOSIS — M5489 Other dorsalgia: Secondary | ICD-10-CM | POA: Diagnosis not present

## 2019-09-22 DIAGNOSIS — M5030 Other cervical disc degeneration, unspecified cervical region: Secondary | ICD-10-CM | POA: Diagnosis not present

## 2019-09-27 DIAGNOSIS — M5030 Other cervical disc degeneration, unspecified cervical region: Secondary | ICD-10-CM | POA: Diagnosis not present

## 2019-09-27 DIAGNOSIS — M7918 Myalgia, other site: Secondary | ICD-10-CM | POA: Diagnosis not present

## 2019-09-27 DIAGNOSIS — M5489 Other dorsalgia: Secondary | ICD-10-CM | POA: Diagnosis not present

## 2019-09-27 DIAGNOSIS — M79605 Pain in left leg: Secondary | ICD-10-CM | POA: Diagnosis not present

## 2019-09-29 DIAGNOSIS — M7918 Myalgia, other site: Secondary | ICD-10-CM | POA: Diagnosis not present

## 2019-09-29 DIAGNOSIS — M79605 Pain in left leg: Secondary | ICD-10-CM | POA: Diagnosis not present

## 2019-09-29 DIAGNOSIS — M5030 Other cervical disc degeneration, unspecified cervical region: Secondary | ICD-10-CM | POA: Diagnosis not present

## 2019-09-29 DIAGNOSIS — M5489 Other dorsalgia: Secondary | ICD-10-CM | POA: Diagnosis not present

## 2019-10-10 DIAGNOSIS — M79605 Pain in left leg: Secondary | ICD-10-CM | POA: Diagnosis not present

## 2019-10-10 DIAGNOSIS — M7918 Myalgia, other site: Secondary | ICD-10-CM | POA: Diagnosis not present

## 2019-10-10 DIAGNOSIS — M5489 Other dorsalgia: Secondary | ICD-10-CM | POA: Diagnosis not present

## 2019-10-10 DIAGNOSIS — M5030 Other cervical disc degeneration, unspecified cervical region: Secondary | ICD-10-CM | POA: Diagnosis not present

## 2019-10-12 ENCOUNTER — Other Ambulatory Visit: Payer: Self-pay | Admitting: Obstetrics & Gynecology

## 2019-10-12 ENCOUNTER — Telehealth: Payer: Self-pay | Admitting: Psychiatry

## 2019-10-12 ENCOUNTER — Other Ambulatory Visit: Payer: Self-pay

## 2019-10-12 ENCOUNTER — Encounter: Payer: Self-pay | Admitting: Family Medicine

## 2019-10-12 ENCOUNTER — Ambulatory Visit (AMBULATORY_SURGERY_CENTER): Payer: Self-pay

## 2019-10-12 VITALS — Ht 60.0 in | Wt 102.4 lb

## 2019-10-12 DIAGNOSIS — F3342 Major depressive disorder, recurrent, in full remission: Secondary | ICD-10-CM

## 2019-10-12 DIAGNOSIS — B3781 Candidal esophagitis: Secondary | ICD-10-CM

## 2019-10-12 DIAGNOSIS — Z1231 Encounter for screening mammogram for malignant neoplasm of breast: Secondary | ICD-10-CM

## 2019-10-12 NOTE — Telephone Encounter (Signed)
Patient called and said that she feels the medicine is not wqorking and she would like to change medicine. Please call her back at [phone]

## 2019-10-12 NOTE — Progress Notes (Signed)
No allergies to soy or egg Pt is not on blood thinners or diet pills Denies issues with sedation/intubation Denies atrial flutter/fib Denies constipation   Pt is aware of Covid safety and care partner requirements.      

## 2019-10-13 MED ORDER — BUPROPION HCL ER (XL) 150 MG PO TB24: 150.0000 mg | ORAL_TABLET | Freq: Every day | ORAL | 0 refills | Status: DC

## 2019-10-26 ENCOUNTER — Other Ambulatory Visit: Payer: Self-pay

## 2019-10-26 ENCOUNTER — Other Ambulatory Visit: Payer: Self-pay | Admitting: Psychiatry

## 2019-10-26 ENCOUNTER — Ambulatory Visit (AMBULATORY_SURGERY_CENTER): Payer: BLUE CROSS/BLUE SHIELD | Admitting: Gastroenterology

## 2019-10-26 ENCOUNTER — Encounter: Payer: Self-pay | Admitting: Gastroenterology

## 2019-10-26 VITALS — BP 111/71 | HR 54 | Temp 97.2°F | Resp 13 | Ht 60.0 in | Wt 102.0 lb

## 2019-10-26 DIAGNOSIS — B3781 Candidal esophagitis: Secondary | ICD-10-CM

## 2019-10-26 DIAGNOSIS — R05 Cough: Secondary | ICD-10-CM | POA: Diagnosis not present

## 2019-10-26 DIAGNOSIS — K229 Disease of esophagus, unspecified: Secondary | ICD-10-CM | POA: Diagnosis not present

## 2019-10-26 DIAGNOSIS — F3342 Major depressive disorder, recurrent, in full remission: Secondary | ICD-10-CM

## 2019-10-26 MED ORDER — SODIUM CHLORIDE 0.9 % IV SOLN: 500.0000 mL | Freq: Once | INTRAVENOUS | Status: DC

## 2019-10-26 NOTE — Progress Notes (Signed)
No problems noted in the recovery room. maw 

## 2019-10-26 NOTE — Progress Notes (Signed)
Called to room to assist during endoscopic procedure.  Patient ID and intended procedure confirmed with present staff. Received instructions for my participation in the procedure from the performing physician.  

## 2019-10-26 NOTE — Telephone Encounter (Signed)
Has apt 07/01, just started Wellbutrin last week

## 2019-10-26 NOTE — Progress Notes (Signed)
PT taken to PACU. Monitors in place. VSS. Report given to RN. 

## 2019-10-26 NOTE — Progress Notes (Signed)
Pt's states no medical or surgical changes since previsit or office visit.  CW - vitals 

## 2019-10-26 NOTE — Patient Instructions (Addendum)
You may resume your current medications today. Await biopsy results. Please call if any questions or concerns.     YOU HAD AN ENDOSCOPIC PROCEDURE TODAY AT [facility]:   Refer to the procedure report that was given to you for any specific questions about what was found during the examination.  If the procedure report does not answer your questions, please call your gastroenterologist to clarify.  If you requested that your care partner not be given the details of your procedure findings, then the procedure report has been included in a sealed envelope for you to review at your convenience later.  YOU SHOULD EXPECT: Some feelings of bloating in the abdomen. Passage of more gas than usual.  Walking can help get rid of the air that was put into your GI tract during the procedure and reduce the bloating. If you had a lower endoscopy (such as a colonoscopy or flexible sigmoidoscopy) you may notice spotting of blood in your stool or on the toilet paper. If you underwent a bowel prep for your procedure, you may not have a normal bowel movement for a few days.  Please Note:  You might notice some irritation and congestion in your nose or some drainage.  This is from the oxygen used during your procedure.  There is no need for concern and it should clear up in a day or so.  SYMPTOMS TO REPORT IMMEDIATELY:  r   Following upper endoscopy (EGD)  Vomiting of blood or coffee ground material  New chest pain or pain under the shoulder blades  Painful or persistently difficult swallowing  New shortness of breath  Fever of 100F or higher  Black, tarry-looking stools  For urgent or emergent issues, a gastroenterologist can be reached at any hour by calling [phone]. Do not use MyChart messaging for urgent concerns.    DIET:  We do recommend a small meal at first, but then you may proceed to your regular diet.  Drink plenty of fluids but you should avoid alcoholic beverages for  24 hours.  ACTIVITY:  You should plan to take it easy for the rest of today and you should NOT DRIVE or use heavy machinery until tomorrow (because of the sedation medicines used during the test).    FOLLOW UP: Our staff will call the number listed on your records 48-72 hours following your procedure to check on you and address any questions or concerns that you may have regarding the information given to you following your procedure. If we do not reach you, we will leave a message.  We will attempt to reach you two times.  During this call, we will ask if you have developed any symptoms of COVID 19. If you develop any symptoms (ie: fever, flu-like symptoms, shortness of breath, cough etc.) before then, please call [phone].  If you test positive for Covid 19 in the 2 weeks post procedure, please call and report this information to [place].    If any biopsies were taken you will be contacted by phone or by letter within the next 1-3 weeks.  Please call us at [phone] if you have not heard about the biopsies in 3 weeks.    SIGNATURES/CONFIDENTIALITY: You and/or your care partner have signed paperwork which will be entered into your electronic medical record.  These signatures attest to the fact that that the information above on your After Visit Summary has been reviewed and is understood.  Full responsibility of the confidentiality of  this discharge information lies with you and/or your care-partner.

## 2019-10-26 NOTE — Op Note (Signed)
[facility] Patient Name: Heather Davila Procedure Date: 10/26/2019 9:51 AM MRN: [id number] Endoscopist: Milus Banister , MD Age: 59 Referring MD:  Date of Birth: Dec 24, 1960 Gender: Female Account #: [id number] Procedure:                Upper GI endoscopy Indications:              Chronic cough while eating particular foods, EGD                            2020 candida esophagitis without improvement in                            symptoms after diflucan course twice, MBS and                            Barium esopahgram normal. EGD remotely with Dr.                            Collene Mares, normal esophageal biopsies. Currently on no                            antiacid meds. Medicines:                Monitored Anesthesia Care Procedure:                Pre-Anesthesia Assessment:                           - Prior to the procedure, a History and Physical                            was performed, and patient medications and                            allergies were reviewed. The patient's tolerance of                            previous anesthesia was also reviewed. The risks                            and benefits of the procedure and the sedation                            options and risks were discussed with the patient.                            All questions were answered, and informed consent                            was obtained. Prior Anticoagulants: The patient has                            taken no previous anticoagulant or antiplatelet  agents. ASA Grade Assessment: II - A patient with                            mild systemic disease. After reviewing the risks                            and benefits, the patient was deemed in                            satisfactory condition to undergo the procedure.                           After obtaining informed consent, the endoscope was                            passed under direct vision. Throughout the                             procedure, the patient's blood pressure, pulse, and                            oxygen saturations were monitored continuously. The                            Endoscope was introduced through the mouth, and                            advanced to the second part of duodenum. The upper                            GI endoscopy was accomplished without difficulty.                            The patient tolerated the procedure well. Scope In: Scope Out: Findings:                 The esophagus was normal.                           The stomach was normal.                           The examined duodenum was normal.                           Biopsies were taken with a cold forceps in the                            distal and proximal esophagus in separate jars and                            sent to pathology. Complications:            No immediate complications. Estimated blood loss:  None. Estimated Blood Loss:     Estimated blood loss: none. Impression:               - Normal UGI tract.                           - Esophagus was biopsied to check for eosinophilic                            esophagitis. Recommendation:           - Patient has a contact number available for                            emergencies. The signs and symptoms of potential                            delayed complications were discussed with the                            patient. Return to normal activities tomorrow.                            Written discharge instructions were provided to the                            patient.                           - Resume previous diet.                           - Continue present medications.                           - Await pathology results. Milus Banister, MD 10/26/2019 44:81:85 AM This report has been signed electronically.

## 2019-10-27 ENCOUNTER — Telehealth (INDEPENDENT_AMBULATORY_CARE_PROVIDER_SITE_OTHER): Payer: BLUE CROSS/BLUE SHIELD | Admitting: Psychiatry

## 2019-10-27 ENCOUNTER — Other Ambulatory Visit: Payer: Self-pay

## 2019-10-27 ENCOUNTER — Telehealth: Payer: Self-pay | Admitting: Psychiatry

## 2019-10-27 ENCOUNTER — Encounter: Payer: Self-pay | Admitting: Psychiatry

## 2019-10-27 DIAGNOSIS — F3342 Major depressive disorder, recurrent, in full remission: Secondary | ICD-10-CM

## 2019-10-27 DIAGNOSIS — M7918 Myalgia, other site: Secondary | ICD-10-CM | POA: Diagnosis not present

## 2019-10-27 DIAGNOSIS — M79605 Pain in left leg: Secondary | ICD-10-CM | POA: Diagnosis not present

## 2019-10-27 DIAGNOSIS — M5030 Other cervical disc degeneration, unspecified cervical region: Secondary | ICD-10-CM | POA: Diagnosis not present

## 2019-10-27 DIAGNOSIS — M5489 Other dorsalgia: Secondary | ICD-10-CM | POA: Diagnosis not present

## 2019-10-27 MED ORDER — BUPROPION HCL ER (XL) 150 MG PO TB24: 150.0000 mg | ORAL_TABLET | Freq: Every day | ORAL | 0 refills | Status: DC

## 2019-10-27 MED ORDER — LITHIUM CARBONATE 150 MG PO CAPS: ORAL_CAPSULE | ORAL | 1 refills | Status: DC

## 2019-10-27 NOTE — Telephone Encounter (Signed)
90 day sent to [facility] for Wellbutrin XL 150 mg

## 2019-10-27 NOTE — Telephone Encounter (Signed)
Patient called and said her [facility]butrin xl [facility]. Please re send

## 2019-10-27 NOTE — Progress Notes (Signed)
[name] [id number] 04-Aug-1960 59 y.o.  Virtual Visit via Telephone Note  I connected with pt on 10/27/19 at  1:15 PM EDT by telephone and verified that I am speaking with the correct person using two identifiers.   I discussed the limitations, risks, security and privacy concerns of performing an evaluation and management service by telephone and the availability of in person appointments. I also discussed with the patient that there may be a patient responsible charge related to this service. The patient expressed understanding and agreed to proceed.   I discussed the assessment and treatment plan with the patient. The patient was provided an opportunity to ask questions and all were answered. The patient agreed with the plan and demonstrated an understanding of the instructions.   The patient was advised to call back or seek an in-person evaluation if the symptoms worsen or if the condition fails to improve as anticipated.  I provided 30 minutes of non-face-to-face time during this encounter.  The patient was located at home.  The provider was located at [place].   [name], PMHNP   Subjective:   Patient ID:  Heather Davila is a 59 y.o. (DOB 1960-09-06) female.  Chief Complaint:  Chief Complaint  Patient presents with  . Depression  . Sleeping Problem    HPI Heather Davila presents for follow-up of depression. She reports, "I don't feel like the medicine is helping like it should." She reports that she also did not notice any improvement with increase in Effexor XR. She has not experienced any improvement with addition of Wellbutrin XL approximately 2 weeks ago. She reports that her sleep has been disrupted with early morning awakenings. She reports anxiety has been "not too bad." She reports that Klonopin no longer seems to be as effective for her sleep. Had some initial increase in anxiety with starting Wellbutrin and then this resolved.   She reports, "I  have good days and bad." She reports that she has "waves" of depression without apparent trigger. She reports that waves of depression can last hours or a couple of days. She reports that her energy and motivation are lower when depression comes on and otherwise there is no change in energy or motivation. Reports that she is still doing the tasks that she needs to do. Denies diminished interests and anhedonia, other than during waves of depression. Appetite has not changed. Denies being socially withdrawn overall. Denies SI.   Past Psychiatric Medication Trials: Effexor XR- Effective, well tolerated. Has taken for about 6 months.  Brintellix - Initially effective and then was not effective when re-started Trintellix Paxil- Ineffective. Helped initially. Cymbalta- Ineffective Wellbutrin XL Abilify- Took briefly. Did not like.  Klonopin- Effective Temazepam- Took during menopause Lunesta- Ineffective Ambien-Ineffective Sonata- Ineffective Trazodone- Ineffective Adderall- Took once and had adverse effect. Lamictal- May have had an adverse effects. "Felt weird."   Review of Systems:  Review of Systems  HENT: Positive for trouble swallowing.   Musculoskeletal: Negative for gait problem.  Neurological: Negative for tremors.  Psychiatric/Behavioral:       Please refer to HPI    Medications: I have reviewed the patient's current medications.  Current Outpatient Medications  Medication Sig Dispense Refill  . clonazePAM (KLONOPIN) 0.5 MG tablet TAKE 1 TABLET BY MOUTH EVERY DAY AS NEEDED FOR ANXIETY 30 tablet 5  . ibuprofen (ADVIL,MOTRIN) 200 MG tablet Take 200 mg by mouth every 6 (six) hours as needed.    Marland Kitchen MAGNESIUM PO Take by mouth.    Marland Kitchen  ORACEA 40 MG capsule     . venlafaxine XR (EFFEXOR-XR) 150 MG 24 hr capsule Take 2 capsules (300 mg total) by mouth daily with breakfast. 180 capsule 1  . YUVAFEM 10 MCG TABS vaginal tablet INSERT 1 TABLET VAGINALLY TWICE WEEKLY 24 tablet 3  . buPROPion  (WELLBUTRIN XL) 150 MG 24 hr tablet Take 1 tablet (150 mg total) by mouth daily. 90 tablet 0  . lithium carbonate 150 MG capsule Take 1 capsule po QHS x 3-5 days, then increase to 2 capsules po QHS 60 capsule 1  . valACYclovir (VALTREX) 500 MG tablet Take one twice daily for 3 days at onset of outbreak 45 tablet 12   No current facility-administered medications for this visit.    Medication Side Effects: None  Allergies:  Allergies  Allergen Reactions  . Ciprofloxacin     REACTION: hives  . Oxycodone-Acetaminophen     REACTION: hives    Past Medical History:  Diagnosis Date  . Anemia   . Anorexia nervosa teen  . DEPRESSION 08/09/2009  . Insomnia   . 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  . TRANSAMINASES, SERUM, ELEVATED 08/14/2009    Family History  Problem Relation Age of Onset  . Hypertension Mother   . Colon cancer Mother 48       colon rescection  . Colon polyps Mother   . Heart disease Father 31       CABG62  . Stroke Father 41  . Heart disease Paternal Grandfather   . Heart attack Sister 88       no stents  . Depression Sister   . Heart failure Paternal Aunt   . Diabetes Paternal Uncle   . Esophageal cancer Neg Hx   . Rectal cancer Neg Hx   . Stomach cancer Neg Hx     Social History   Socioeconomic History  . Marital status: Married    Spouse name: Heather Davila  . Number of children: 3  . Years of education: Master's  . Highest education level: Not on file  Occupational History  . Occupation: Optician, dispensing: [employer]  Tobacco Use  . Smoking status: Former Smoker    Packs/day: 0.50    Years: 5.00    Pack years: 2.50    Types: Cigarettes    Quit date: 07/10/1987    Years since quitting: 32.3  . Smokeless tobacco: Never Used  Vaping Use  . Vaping Use: Never used  Substance and Sexual Activity  . Alcohol use: No  . Drug use: No  . Sexual activity: Yes    Partners: Male    Birth  control/protection: Post-menopausal, Other-see comments    Comment: vasectomy  Other Topics Concern  . Not on file  Social History Narrative   Patient is married Heather Davila) and lives at home with her husband and two children.   Patient has three children.   Patient works at [employer].   Patient has a Master's degree.   Patient is left-handed.   Patient drinks 6 cups of coffee daily.   Social Determinants of Health   Financial Resource Strain:   . Difficulty of Paying Living Expenses:   Food Insecurity:   . Worried About Charity fundraiser in the Last Year:   . Arboriculturist in the Last Year:   Transportation Needs:   . Film/video editor (Medical):   Marland Kitchen Lack of Transportation (  Non-Medical):   Physical Activity:   . Days of Exercise per Week:   . Minutes of Exercise per Session:   Stress:   . Feeling of Stress :   Social Connections:   . Frequency of Communication with Friends and Family:   . Frequency of Social Gatherings with Friends and Family:   . Attends Religious Services:   . Active Member of Clubs or Organizations:   . Attends Archivist Meetings:   Marland Kitchen Marital Status:   Intimate Partner Violence:   . Fear of Current or Ex-Partner:   . Emotionally Abused:   Marland Kitchen Physically Abused:   . Sexually Abused:     Past Medical History, Surgical history, Social history, and Family history were reviewed and updated as appropriate.   Please see review of systems for further details on the patient's review from today.   Objective:   Physical Exam:  LMP 12/27/2007 (Exact Date)   Physical Exam Neurological:     Mental Status: She is alert and oriented to person, place, and time.     Cranial Nerves: No dysarthria.  Psychiatric:        Attention and Perception: Attention and perception normal.        Mood and Affect: Mood is depressed.        Speech: Speech normal.        Behavior: Behavior is cooperative.        Thought Content: Thought content  normal. Thought content is not paranoid or delusional. Thought content does not include homicidal or suicidal ideation. Thought content does not include homicidal or suicidal plan.        Cognition and Memory: Cognition and memory normal.        Judgment: Judgment normal.     Comments: Insight intact     Lab Review:     Component Value Date/Time   NA 137 08/31/2018 0912   K 4.1 08/31/2018 0912   CL 98 08/31/2018 0912   CO2 32 08/31/2018 0912   GLUCOSE 95 08/31/2018 0912   BUN 20 08/31/2018 0912   CREATININE 0.59 08/31/2018 0912   CREATININE 0.62 10/26/2014 0953   CALCIUM 10.0 08/31/2018 0912   PROT 7.6 08/31/2018 0912   ALBUMIN 4.8 08/31/2018 0912   AST 27 08/31/2018 0912   ALT 32 08/31/2018 0912   ALKPHOS 75 08/31/2018 0912   BILITOT 0.5 08/31/2018 0912   GFRNONAA >90 05/01/2014 1645   GFRAA >90 05/01/2014 1645       Component Value Date/Time   WBC 3.8 (L) 08/31/2018 0912   RBC 4.40 08/31/2018 0912   HGB 14.6 08/31/2018 0912   HGB 14.1 10/26/2014 1034   HCT 42.1 08/31/2018 0912   PLT 230.0 08/31/2018 0912   MCV 95.6 08/31/2018 0912   MCH 32.6 05/01/2014 1645   MCHC 34.7 08/31/2018 0912   RDW 12.3 08/31/2018 0912   LYMPHSABS 1.2 08/31/2018 0912   MONOABS 0.3 08/31/2018 0912   EOSABS 0.2 08/31/2018 0912   BASOSABS 0.0 08/31/2018 0912    No results found for: POCLITH, LITHIUM   No results found for: PHENYTOIN, PHENOBARB, VALPROATE, CBMZ   .res Assessment: Plan:   Discussed treatment options with patient based upon her past responses to medications.  Discussed potential benefits, risks, and side effects of several treatment options to include Rexulti and Lithium.  Patient reports that she is willing to start trial of other medication.  Discussed starting low-dose lithium since this may also be helpful for sleep initiation and  recent sleep disturbance.  Encouraged patient to contact office if she has not experienced any improvement or if signs and symptoms worsen, and  then Rexulti could be initiated. Will start lithium 150 mg p.o. nightly for 3 to 5 days, then increase to 2 capsules at bedtime for augmentation of depression. Will continue Wellbutrin XL 150 mg daily since more time is needed for an adequate trial since patient has only resumed Wellbutrin XL 2 weeks ago. Continue Effexor XR 300 mg daily for depression and anxiety. Continue Klonopin 0.5 mg at bedtime. Patient to follow-up in 2 to 3 weeks or sooner if clinically indicated. Patient advised to contact office with any questions, adverse effects, or acute worsening in signs and symptoms.   Heather Davila was seen today for depression and sleeping problem.  Diagnoses and all orders for this visit:  Recurrent major depressive disorder, in full remission ([place]) -     lithium carbonate 150 MG capsule; Take 1 capsule po QHS x 3-5 days, then increase to 2 capsules po QHS    Please see After Visit Summary for patient specific instructions.  Future Appointments  Date Time Provider [place]  11/15/2019  1:00 PM Megan Salon, MD [place] None  12/05/2019  4:20 PM [facility]    No orders of the defined types were placed in this encounter.     -------------------------------

## 2019-10-28 ENCOUNTER — Telehealth: Payer: Self-pay

## 2019-10-28 NOTE — Telephone Encounter (Signed)
  Follow up Call-  Call back number 10/26/2019 10/05/2018  Post procedure Call Back phone  # [phone]  Permission to leave phone message Yes Yes  Some recent data might be hidden     Patient questions:  Do you have a fever, pain , or abdominal swelling? No. Pain Score  0 *  Have you tolerated food without any problems? Yes.    Have you been able to return to your normal activities? Yes.    Do you have any questions about your discharge instructions: Diet   No. Medications  No. Follow up visit  No.  Do you have questions or concerns about your Care? No.  Actions: * If pain score is 4 or above: No action needed, pain <4.  1. Have you developed a fever since your procedure? no  2.   Have you had an respiratory symptoms (SOB or cough) since your procedure? no  3.   Have you tested positive for COVID 19 since your procedure no  4.   Have you had any family members/close contacts diagnosed with the COVID 19 since your procedure?  no   If yes to any of these questions please route to Joylene John, RN and Erenest Rasher, RN

## 2019-11-04 ENCOUNTER — Other Ambulatory Visit: Payer: Self-pay | Admitting: Psychiatry

## 2019-11-04 DIAGNOSIS — M79605 Pain in left leg: Secondary | ICD-10-CM | POA: Diagnosis not present

## 2019-11-04 DIAGNOSIS — M7918 Myalgia, other site: Secondary | ICD-10-CM | POA: Diagnosis not present

## 2019-11-04 DIAGNOSIS — M5489 Other dorsalgia: Secondary | ICD-10-CM | POA: Diagnosis not present

## 2019-11-04 DIAGNOSIS — F3342 Major depressive disorder, recurrent, in full remission: Secondary | ICD-10-CM

## 2019-11-04 DIAGNOSIS — M5030 Other cervical disc degeneration, unspecified cervical region: Secondary | ICD-10-CM | POA: Diagnosis not present

## 2019-11-04 NOTE — Telephone Encounter (Signed)
Pt requesting [facility]ill [facility] Apt [facility]

## 2019-11-07 MED ORDER — LITHIUM CARBONATE 150 MG PO CAPS: ORAL_CAPSULE | ORAL | 0 refills | Status: DC

## 2019-11-11 ENCOUNTER — Ambulatory Visit: Payer: Self-pay | Admitting: Certified Nurse Midwife

## 2019-11-14 NOTE — Progress Notes (Addendum)
59 y.o. [id number] Married Other or two or more races female here for annual exam.  Denies vaginal bleeding.  Never on HRT.  She   Patient's last menstrual period was 12/27/2007 (exact date).          Sexually active: Yes.    The current method of family planning is vasectomy.    Exercising: Yes.    walking Smoker:  no  Health Maintenance: Pap:  11-08-2018 neg HPV HR neg History of abnormal Pap:  no MMG:  12-06-2018 category c density birads 1:neg Colonoscopy:  2020 f/u 3 yrs polyp (Dr. Ardis Hughs) BMD:   2019 osteopenia TDaP:  2019 Pneumonia vaccine(s):  Not done Shingrix:   Not done Hep C testing: neg per patient Screening Labs:  Done 2020   reports that she quit smoking about 32 years ago. Her smoking use included cigarettes. She has a 2.50 pack-year smoking history. She has never used smokeless tobacco. She reports that she does not drink alcohol and does not use drugs.  Past Medical History:  Diagnosis Date  . Anemia   . Anorexia nervosa teen  . DEPRESSION 08/09/2009  . Insomnia   . 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  . TRANSAMINASES, SERUM, ELEVATED 08/14/2009    Past Surgical History:  Procedure Laterality Date  . BREAST ENHANCEMENT SURGERY Bilateral   . CESAREAN SECTION     X 3  . DILITATION & CURRETTAGE/HYSTROSCOPY WITH HYDROTHERMAL ABLATION    . ENDOMETRIAL ABLATION  12/27/07  . HYSTEROSCOPY  12/27/07   and HTA  . UPPER GASTROINTESTINAL ENDOSCOPY  2020    Current Outpatient Medications  Medication Sig Dispense Refill  . buPROPion (WELLBUTRIN XL) 150 MG 24 hr tablet Take 1 tablet (150 mg total) by mouth daily. 90 tablet 0  . clonazePAM (KLONOPIN) 0.5 MG tablet TAKE 1 TABLET BY MOUTH EVERY DAY AS NEEDED FOR ANXIETY 30 tablet 5  . ibuprofen (ADVIL,MOTRIN) 200 MG tablet Take 200 mg by mouth every 6 (six) hours as needed.    . lithium carbonate 150 MG capsule Take 2-3 capsules po QHS 270 capsule 0  . MAGNESIUM PO Take by  mouth.    . ORACEA 40 MG capsule     . valACYclovir (VALTREX) 500 MG tablet Take one twice daily for 3 days at onset of outbreak 45 tablet 12  . venlafaxine XR (EFFEXOR-XR) 150 MG 24 hr capsule Take 2 capsules (300 mg total) by mouth daily with breakfast. 180 capsule 1  . YUVAFEM 10 MCG TABS vaginal tablet INSERT 1 TABLET VAGINALLY TWICE WEEKLY 24 tablet 3   No current facility-administered medications for this visit.    Family History  Problem Relation Age of Onset  . Hypertension Mother   . Colon cancer Mother 46       colon rescection  . Colon polyps Mother   . Heart disease Father 66       CABG62  . Stroke Father 83  . Heart disease Paternal Grandfather   . Heart attack Sister 18       no stents  . Depression Sister   . Heart failure Paternal Aunt   . Diabetes Paternal Uncle   . Esophageal cancer Neg Hx   . Rectal cancer Neg Hx   . Stomach cancer Neg Hx     Review of Systems  Constitutional: Negative.   HENT: Negative.   Eyes: Negative.   Respiratory: Negative.   Cardiovascular: Negative.  Gastrointestinal: Negative.   Endocrine: Negative.   Musculoskeletal: Negative.   Skin: Negative.   Allergic/Immunologic: Negative.   Neurological: Negative.   Hematological: Negative.   Psychiatric/Behavioral: Negative.     Exam:   BP 100/62   Pulse 70   Resp 16   Ht 5' (1.524 m)   Wt 97 lb (44 kg)   LMP 12/27/2007 (Exact Date)   BMI 18.94 kg/m   Height: 5' (152.4 cm)  General appearance: alert, cooperative and appears stated age Head: Normocephalic, without obvious abnormality, atraumatic Neck: no adenopathy, supple, symmetrical, trachea midline and thyroid normal to inspection and palpation Lungs: clear to auscultation bilaterally Breasts: normal appearance, no masses or tenderness, saline implants present Heart: regular rate and rhythm Abdomen: soft, non-tender; bowel sounds normal; no masses,  no organomegaly Extremities: extremities normal, atraumatic, no  cyanosis or edema Skin: Skin color, texture, turgor normal. No rashes or lesions Lymph nodes: Cervical, supraclavicular, and axillary nodes normal. No abnormal inguinal nodes palpated Neurologic: Grossly normal   Pelvic: External genitalia:  no lesions              Urethra:  normal appearing urethra with no masses, tenderness or lesions              Bartholins and Skenes: normal                 Vagina: normal appearing vagina with normal color and discharge, no lesions              Cervix: no lesions              Pap taken: No. Bimanual Exam:  Uterus:  normal size, contour, position, consistency, mobility, non-tender              Adnexa: normal adnexa and no mass, fullness, tenderness               Rectovaginal: Confirms               Anus:  normal sphincter tone, no lesions  Chaperone, Terence Lux, CMA, was present for exam.  A:  Well Woman with normal exam PMP, no HRT Saline breast implants Family hx of colon cancer in her mother (aged 71) H/o HSV 2 H/o anorexia and depression Vaginal atrophy  P:   Mammogram guidelines reviewed.  This is scheduled for early August pap smear neg with neg HR HPV 2020.  Not indicated today Colonoscopy due 2023 D/w pt shingrix vaccination.  She's going to do this with a local pharmacy. Trial of estrace vaginal cream, 1 gram pv twice weekly.  #42.5 gram/3RD Lab work done 2020 Return annually or prn

## 2019-11-15 ENCOUNTER — Ambulatory Visit (INDEPENDENT_AMBULATORY_CARE_PROVIDER_SITE_OTHER): Payer: BLUE CROSS/BLUE SHIELD | Admitting: Obstetrics & Gynecology

## 2019-11-15 ENCOUNTER — Other Ambulatory Visit: Payer: Self-pay

## 2019-11-15 ENCOUNTER — Encounter: Payer: Self-pay | Admitting: Obstetrics & Gynecology

## 2019-11-15 ENCOUNTER — Telehealth: Payer: Self-pay | Admitting: Gastroenterology

## 2019-11-15 VITALS — BP 100/62 | HR 70 | Resp 16 | Ht 60.0 in | Wt 97.0 lb

## 2019-11-15 DIAGNOSIS — A6009 Herpesviral infection of other urogenital tract: Secondary | ICD-10-CM

## 2019-11-15 DIAGNOSIS — N951 Menopausal and female climacteric states: Secondary | ICD-10-CM

## 2019-11-15 DIAGNOSIS — Z01419 Encounter for gynecological examination (general) (routine) without abnormal findings: Secondary | ICD-10-CM | POA: Diagnosis not present

## 2019-11-15 MED ORDER — ESTRADIOL 0.1 MG/GM VA CREA: TOPICAL_CREAM | VAGINAL | 3 refills | Status: DC

## 2019-11-15 NOTE — Telephone Encounter (Signed)
  The pt has been advised that Dr Ardis Hughs sent a message on 7/6 regarding path results.  I have also given the information to the pt and resent to her My Chart. The pt has been advised of the information and verbalized understanding.     Heather Davila, The biopsies from your esophagus were all completely normal. So far, there is no evidence that your bothersome cough related to eating certain foods is due to a gastrointestinal issue. I think he should follow-up with pulmonary with regards to the testing that they recommended.

## 2019-11-15 NOTE — Telephone Encounter (Signed)
Pt is requesting results from EGD she had on 6/30

## 2019-11-17 ENCOUNTER — Encounter: Payer: Self-pay | Admitting: Psychiatry

## 2019-11-17 ENCOUNTER — Telehealth (INDEPENDENT_AMBULATORY_CARE_PROVIDER_SITE_OTHER): Payer: BLUE CROSS/BLUE SHIELD | Admitting: Psychiatry

## 2019-11-17 DIAGNOSIS — G4700 Insomnia, unspecified: Secondary | ICD-10-CM | POA: Diagnosis not present

## 2019-11-17 DIAGNOSIS — F3342 Major depressive disorder, recurrent, in full remission: Secondary | ICD-10-CM

## 2019-11-17 DIAGNOSIS — F419 Anxiety disorder, unspecified: Secondary | ICD-10-CM | POA: Diagnosis not present

## 2019-11-17 MED ORDER — VENLAFAXINE HCL ER 150 MG PO CP24: 300.0000 mg | ORAL_CAPSULE | Freq: Every day | ORAL | 1 refills | Status: DC

## 2019-11-17 MED ORDER — CLONAZEPAM 0.5 MG PO TABS: 0.7500 mg | ORAL_TABLET | Freq: Every day | ORAL | 4 refills | Status: DC

## 2019-11-17 MED ORDER — LITHIUM CARBONATE 150 MG PO CAPS: ORAL_CAPSULE | ORAL | 1 refills | Status: DC

## 2019-11-17 NOTE — Progress Notes (Signed)
Heather Davila [id number] 10/02/1960 59 y.o.  Virtual Visit via Video Note  I connected with pt @ on 11/17/19 at  3:00 PM EDT by a video enabled telemedicine application and verified that I am speaking with the correct person using two identifiers.   I discussed the limitations of evaluation and management by telemedicine and the availability of in person appointments. The patient expressed understanding and agreed to proceed.  I discussed the assessment and treatment plan with the patient. The patient was provided an opportunity to ask questions and all were answered. The patient agreed with the plan and demonstrated an understanding of the instructions.   The patient was advised to call back or seek an in-person evaluation if the symptoms worsen or if the condition fails to improve as anticipated.  I provided 20 minutes of non-face-to-face time during this encounter.  The patient was located at home.  The provider was located at [place].   [name], PMHNP   Subjective:   Patient ID:  Heather Davila is a 60 y.o. (DOB 1961/01/06) female.  Chief Complaint:  Chief Complaint  Patient presents with  . Follow-up    Depression, Insomnia    HPI Heather Davila presents for follow-up of depression and insomnia. She reports that she feels that Lithium has been helping with mood. Noticed additional improvement with increase in Lithium to 450 mg po QHS about 5 days ago. She did not notice any significant change in 150 mg and noticed some partial improvement with 300 mg dose. She reports that she stopped taking Wellbutrin. She reports that she is less depressed. No longer experiencing "waves" of depression. She reports sometimes she notices some very slight depression. Denies irritability. She reports that her anxiety is ok. She is experiencing increased interest and excitement about things. Excited about seeing her granddaughter next weekend. She reports that her energy and  motivation have been ok overall. Has to push herself to do some tasks that are not enjoyable. Concentration has been adequate. Appetite has been good. She has been awakening most nights around 3 am. She has been taking an additional 1/4 tab of Klonopin and then is able to return to sleep quickly. Sleeping about 7.5-8 hours. No recent socially withdrawn behavior. Denies SI.  Works part-time now and will return to work full-time in August.   Past Psychiatric Medication Trials: Effexor XR- Effective, well tolerated. Has taken for about 6 months.  Brintellix - Initially effective and then was not effective when re-started Trintellix Paxil- Ineffective. Helped initially. Cymbalta- Ineffective Wellbutrin XL Abilify- Took briefly. Did not like.  Klonopin- Effective Temazepam- Took during menopause Lunesta- Ineffective Ambien-Ineffective Sonata- Ineffective Trazodone- Ineffective Adderall- Took once and had adverse effect. Lamictal- May have had an adverse effects. "Felt weird."     Review of Systems:  Review of Systems  Gastrointestinal: Negative.   Musculoskeletal: Negative for gait problem.  Neurological: Negative for tremors.  Psychiatric/Behavioral:       Please refer to HPI    Medications: I have reviewed the patient's current medications.  Current Outpatient Medications  Medication Sig Dispense Refill  . clonazePAM (KLONOPIN) 0.5 MG tablet Take 1.5 tablets (0.75 mg total) by mouth at bedtime. 45 tablet 4  . estradiol (ESTRACE VAGINAL) 0.1 MG/GM vaginal cream 1 gram pv twice weekly at bedtime 42.5 g 3  . ibuprofen (ADVIL,MOTRIN) 200 MG tablet Take 200 mg by mouth every 6 (six) hours as needed.    . lithium carbonate 150 MG capsule Take 2-3 capsules po  QHS 270 capsule 1  . MAGNESIUM PO Take by mouth.    . ORACEA 40 MG capsule     . valACYclovir (VALTREX) 500 MG tablet Take one twice daily for 3 days at onset of outbreak 45 tablet 12  . venlafaxine XR (EFFEXOR-XR) 150 MG 24 hr  capsule Take 2 capsules (300 mg total) by mouth daily with breakfast. 180 capsule 1   No current facility-administered medications for this visit.    Medication Side Effects: None  Allergies:  Allergies  Allergen Reactions  . Ciprofloxacin     REACTION: hives  . Oxycodone-Acetaminophen     REACTION: hives    Past Medical History:  Diagnosis Date  . Adult acne   . Anemia   . Anorexia nervosa teen  . DEPRESSION 08/09/2009  . Insomnia   . 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  . TRANSAMINASES, SERUM, ELEVATED 08/14/2009    Family History  Problem Relation Age of Onset  . Hypertension Mother   . Colon cancer Mother 87       colon rescection  . Colon polyps Mother   . Heart disease Father 55       CABG62  . Stroke Father 48  . Heart disease Paternal Grandfather   . Heart attack Sister 55       no stents  . Depression Sister   . Heart failure Paternal Aunt   . Diabetes Paternal Uncle   . Esophageal cancer Neg Hx   . Rectal cancer Neg Hx   . Stomach cancer Neg Hx     Social History   Socioeconomic History  . Marital status: Married    Spouse name: [name]  . Number of children: 3  . Years of education: Master's  . Highest education level: Not on file  Occupational History  . Occupation: Optician, dispensing: [employer]  Tobacco Use  . Smoking status: Former Smoker    Packs/day: 0.50    Years: 5.00    Pack years: 2.50    Types: Cigarettes    Quit date: 07/10/1987    Years since quitting: 32.3  . Smokeless tobacco: Never Used  Vaping Use  . Vaping Use: Never used  Substance and Sexual Activity  . Alcohol use: No  . Drug use: No  . Sexual activity: Yes    Partners: Male    Birth control/protection: Post-menopausal, Other-see comments    Comment: vasectomy  Other Topics Concern  . Not on file  Social History Narrative   Patient is married [name]) and lives at home with her husband and two  children.   Patient has three children.   Patient works at [employer].   Patient has a Master's degree.   Patient is left-handed.   Patient drinks 6 cups of coffee daily.   Social Determinants of Health   Financial Resource Strain:   . Difficulty of Paying Living Expenses:   Food Insecurity:   . Worried About Charity fundraiser in the Last Year:   . Arboriculturist in the Last Year:   Transportation Needs:   . Film/video editor (Medical):   Marland Kitchen Lack of Transportation (Non-Medical):   Physical Activity:   . Days of Exercise per Week:   . Minutes of Exercise per Session:   Stress:   . Feeling of Stress :   Social Connections:   . Frequency of Communication with Friends and  Family:   . Frequency of Social Gatherings with Friends and Family:   . Attends Religious Services:   . Active Member of Clubs or Organizations:   . Attends Archivist Meetings:   Marland Kitchen Marital Status:   Intimate Partner Violence:   . Fear of Current or Ex-Partner:   . Emotionally Abused:   Marland Kitchen Physically Abused:   . Sexually Abused:     Past Medical History, Surgical history, Social history, and Family history were reviewed and updated as appropriate.   Please see review of systems for further details on the patient's review from today.   Objective:   Physical Exam:  LMP 12/27/2007 (Exact Date)   Physical Exam Neurological:     Mental Status: She is alert and oriented to person, place, and time.     Cranial Nerves: No dysarthria.  Psychiatric:        Attention and Perception: Attention and perception normal.        Mood and Affect: Mood normal.        Speech: Speech normal.        Behavior: Behavior is cooperative.        Thought Content: Thought content normal. Thought content is not paranoid or delusional. Thought content does not include homicidal or suicidal ideation. Thought content does not include homicidal or suicidal plan.        Cognition and Memory: Cognition and  memory normal.        Judgment: Judgment normal.     Comments: Insight intact     Lab Review:     Component Value Date/Time   NA 137 08/31/2018 0912   K 4.1 08/31/2018 0912   CL 98 08/31/2018 0912   CO2 32 08/31/2018 0912   GLUCOSE 95 08/31/2018 0912   BUN 20 08/31/2018 0912   CREATININE 0.59 08/31/2018 0912   CREATININE 0.62 10/26/2014 0953   CALCIUM 10.0 08/31/2018 0912   PROT 7.6 08/31/2018 0912   ALBUMIN 4.8 08/31/2018 0912   AST 27 08/31/2018 0912   ALT 32 08/31/2018 0912   ALKPHOS 75 08/31/2018 0912   BILITOT 0.5 08/31/2018 0912   GFRNONAA >90 05/01/2014 1645   GFRAA >90 05/01/2014 1645       Component Value Date/Time   WBC 3.8 (L) 08/31/2018 0912   RBC 4.40 08/31/2018 0912   HGB 14.6 08/31/2018 0912   HGB 14.1 10/26/2014 1034   HCT 42.1 08/31/2018 0912   PLT 230.0 08/31/2018 0912   MCV 95.6 08/31/2018 0912   MCH 32.6 05/01/2014 1645   MCHC 34.7 08/31/2018 0912   RDW 12.3 08/31/2018 0912   LYMPHSABS 1.2 08/31/2018 0912   MONOABS 0.3 08/31/2018 0912   EOSABS 0.2 08/31/2018 0912   BASOSABS 0.0 08/31/2018 0912    No results found for: POCLITH, LITHIUM   No results found for: PHENYTOIN, PHENOBARB, VALPROATE, CBMZ   .res Assessment: Plan:   Will continue lithium 450 mg daily for augmentation of depression since patient reports that her mood has improved with this dose of lithium. Continue Effexor XR 300 mg daily for depression and anxiety. Will increase Klonopin to 0.5 mg 1-1/2 tabs bedtime for insomnia. Patient to follow-up in 4 months or sooner if clinically indicated. Patient advised to contact office with any questions, adverse effects, or acute worsening in signs and symptoms.  Heather Davila was seen today for follow-up.  Diagnoses and all orders for this visit:  Anxiety -     clonazePAM (KLONOPIN) 0.5 MG tablet; Take 1.5 tablets (  0.75 mg total) by mouth at bedtime. -     venlafaxine XR (EFFEXOR-XR) 150 MG 24 hr capsule; Take 2 capsules (300 mg total) by  mouth daily with breakfast.  Insomnia, unspecified type -     clonazePAM (KLONOPIN) 0.5 MG tablet; Take 1.5 tablets (0.75 mg total) by mouth at bedtime.  Recurrent major depressive disorder, in full remission (HCC) -     venlafaxine XR (EFFEXOR-XR) 150 MG 24 hr capsule; Take 2 capsules (300 mg total) by mouth daily with breakfast. -     lithium carbonate 150 MG capsule; Take 2-3 capsules po QHS     Please see After Visit Summary for patient specific instructions.  Future Appointments  Date Time Provider [place]  12/05/2019  4:20 PM [facility]    No orders of the defined types were placed in this encounter.     -------------------------------

## 2019-12-05 ENCOUNTER — Other Ambulatory Visit: Payer: Self-pay

## 2019-12-05 ENCOUNTER — Ambulatory Visit
Admission: RE | Admit: 2019-12-05 | Discharge: 2019-12-05 | Disposition: A | Discharge status: Home / Self Care | Payer: BLUE CROSS/BLUE SHIELD | Source: Ambulatory Visit | Attending: Obstetrics & Gynecology | Admitting: Obstetrics & Gynecology

## 2019-12-05 DIAGNOSIS — Z1231 Encounter for screening mammogram for malignant neoplasm of breast: Secondary | ICD-10-CM | POA: Diagnosis not present

## 2020-01-21 DIAGNOSIS — Z03818 Encounter for observation for suspected exposure to other biological agents ruled out: Secondary | ICD-10-CM | POA: Diagnosis not present

## 2020-01-21 DIAGNOSIS — Z20822 Contact with and (suspected) exposure to covid-19: Secondary | ICD-10-CM | POA: Diagnosis not present

## 2020-01-26 ENCOUNTER — Other Ambulatory Visit: Payer: Self-pay

## 2020-01-26 ENCOUNTER — Encounter: Payer: Self-pay | Admitting: Family Medicine

## 2020-01-26 ENCOUNTER — Ambulatory Visit (INDEPENDENT_AMBULATORY_CARE_PROVIDER_SITE_OTHER): Payer: BLUE CROSS/BLUE SHIELD | Admitting: Family Medicine

## 2020-01-26 VITALS — BP 82/60 | HR 76 | Ht 60.0 in | Wt 99.4 lb

## 2020-01-26 DIAGNOSIS — F909 Attention-deficit hyperactivity disorder, unspecified type: Secondary | ICD-10-CM

## 2020-01-26 DIAGNOSIS — Z0000 Encounter for general adult medical examination without abnormal findings: Secondary | ICD-10-CM

## 2020-01-26 DIAGNOSIS — E079 Disorder of thyroid, unspecified: Secondary | ICD-10-CM | POA: Diagnosis not present

## 2020-01-26 DIAGNOSIS — E237 Disorder of pituitary gland, unspecified: Secondary | ICD-10-CM | POA: Diagnosis not present

## 2020-01-26 DIAGNOSIS — K5909 Other constipation: Secondary | ICD-10-CM

## 2020-01-26 DIAGNOSIS — Z8249 Family history of ischemic heart disease and other diseases of the circulatory system: Secondary | ICD-10-CM

## 2020-01-26 NOTE — Progress Notes (Signed)
Office Visit Note   Patient: Heather Davila           Date of Birth: July 14, 1960           MRN: [id number] Visit Date: 01/26/2020 Requested by: Eulas Post, MD [place],  [place] [id number] PCP: Eunice Blase, MD  Subjective: Chief Complaint  Patient presents with  . Annual Exam    HPI: She is here for annual wellness exam.  Feeling well, no specific concerns.  She has a history of thyroid dysfunction.  She has never required medication, but her TSH is always in the higher range of normal.  She has struggled with depression over the years, and has been on Effexor for about 15 years with good success.  She is not having any symptoms right now.  Since menopause, she has had constipation.  She goes about once per week and usually requires a laxative of some sort.  She was told she had a pituitary disorder but is asymptomatic from that standpoint as well.  She has a family history of early cardiac disease in a couple members.  She has not had any cardiac issues herself and is asymptomatic.  She had a colonoscopy about a year ago which revealed benign polyps.  She is up-to-date on eye exams and dental exams.  She saw her gynecologist recently.                ROS:   All other systems were reviewed and are negative.  Objective: Vital Signs: BP (!) 82/60   Pulse 76   Ht 5' (1.524 m)   Wt 99 lb 6.4 oz (45.1 kg)   LMP 12/27/2007 (Exact Date)   BMI 19.41 kg/m   Physical Exam:  General:  Alert and oriented, in no acute distress. Pulm:  Breathing unlabored. Psy:  Normal mood, congruent affect. Skin: No suspicious lesions seen HEENT:  [place]/AT, PERRLA, EOM Full, no nystagmus.  Funduscopic examination within normal limits.  No conjunctival erythema.  Tympanic membranes are pearly gray with normal landmarks.  External ear canals are normal.  Nasal passages are clear.  Oropharynx is clear.  No significant lymphadenopathy.  No thyromegaly or nodules.  2+ carotid  pulses without bruits. CV: Regular rate and rhythm without murmurs, rubs, or gallops.  No peripheral edema.  2+ radial and posterior tibial pulses. Lungs: Clear to auscultation throughout with no wheezing or areas of consolidation. Abd: Bowel sounds are active, no hepatosplenomegaly or masses.  Soft and nontender.  No audible bruits.  No evidence of ascites. Extremities: 2+ upper and lower DTRs.  No nail deformities.   Imaging: No results found.  Assessment & Plan: 1.  Wellness examination -Labs today.  Follow-up yearly.  2.  Elevated TSH -We will draw more detailed thyroid labs this morning.  3.  Constipation - Trial of elimination diet. [facility] Target GBX probiotics. - Thyroid studies.  4.  FH CAD - Consider CT calcium score.     Procedures: No procedures performed  No notes on file     PMFS History: Patient Active Problem List   Diagnosis Date Noted  . Alopecia 09/03/2017  . Disorder of pituitary gland ([place]) 09/03/2017  . Thyroid dysfunction 09/03/2017  . Menopausal symptom 08/14/2017  . Adult attention deficit hyperactivity disorder 08/12/2017  . 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 Hypersomnia, persistent 09/16/2013  . Herpes genitalis in women 08/16/2013    Class: Diagnosis of  .  Elevated LFTs 04/12/2013  . Insomnia 04/12/2013  . TRANSAMINASES, SERUM, ELEVATED 08/14/2009  . DEPRESSION 08/09/2009   Past Medical History:  Diagnosis Date  . Adult acne   . Anemia   . Anorexia nervosa teen  . DEPRESSION 08/09/2009  . Insomnia   . 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  . TRANSAMINASES, SERUM, ELEVATED 08/14/2009    Family History  Problem Relation Age of Onset  . Hypertension Mother   . Colon cancer Mother 33       colon rescection  . Colon polyps Mother   . Heart disease Father 81       CABG62  . Stroke Father 96  . Heart disease Paternal Grandfather   . Heart  attack Sister 40       no stents  . Depression Sister   . Heart failure Paternal Aunt   . Diabetes Paternal Uncle   . Esophageal cancer Neg Hx   . Rectal cancer Neg Hx   . Stomach cancer Neg Hx     Past Surgical History:  Procedure Laterality Date  . AUGMENTATION MAMMAPLASTY Bilateral   . BREAST ENHANCEMENT SURGERY Bilateral   . CESAREAN SECTION     X 3  . DILITATION & CURRETTAGE/HYSTROSCOPY WITH HYDROTHERMAL ABLATION    . ENDOMETRIAL ABLATION  12/27/07  . HYSTEROSCOPY  12/27/07   and HTA  . UPPER GASTROINTESTINAL ENDOSCOPY  2020   Social History   Occupational History  . Occupation: Optician, dispensing: [employer]  Tobacco Use  . Smoking status: Former Smoker    Packs/day: 0.50    Years: 5.00    Pack years: 2.50    Types: Cigarettes    Quit date: 07/10/1987    Years since quitting: 32.5  . Smokeless tobacco: Never Used  Vaping Use  . Vaping Use: Never used  Substance and Sexual Activity  . Alcohol use: No  . Drug use: No  . Sexual activity: Yes    Partners: Male    Birth control/protection: Post-menopausal, Other-see comments    Comment: vasectomy

## 2020-01-27 ENCOUNTER — Telehealth: Payer: Self-pay | Admitting: Family Medicine

## 2020-01-27 LAB — COMPREHENSIVE METABOLIC PANEL
AG Ratio: 1.9 (calc) (ref 1.0–2.5)
ALT: 194 U/L — ABNORMAL HIGH (ref 6–29)
AST: 91 U/L — ABNORMAL HIGH (ref 10–35)
Albumin: 4.7 g/dL (ref 3.6–5.1)
Alkaline phosphatase (APISO): 61 U/L (ref 37–153)
BUN: 13 mg/dL (ref 7–25)
CO2: 27 mmol/L (ref 20–32)
Calcium: 10.2 mg/dL (ref 8.6–10.4)
Chloride: 99 mmol/L (ref 98–110)
Creat: 0.68 mg/dL (ref 0.50–1.05)
Globulin: 2.5 g/dL (calc) (ref 1.9–3.7)
Glucose, Bld: 84 mg/dL (ref 65–99)
Potassium: 4.3 mmol/L (ref 3.5–5.3)
Sodium: 137 mmol/L (ref 135–146)
Total Bilirubin: 0.5 mg/dL (ref 0.2–1.2)
Total Protein: 7.2 g/dL (ref 6.1–8.1)

## 2020-01-27 LAB — CBC WITH DIFFERENTIAL/PLATELET
Absolute Monocytes: 286 cells/uL (ref 200–950)
Basophils Absolute: 80 cells/uL (ref 0–200)
Basophils Relative: 1.9 %
Eosinophils Absolute: 218 cells/uL (ref 15–500)
Eosinophils Relative: 5.2 %
HCT: 39.3 % (ref 35.0–45.0)
Hemoglobin: 13.7 g/dL (ref 11.7–15.5)
Lymphs Abs: 1247 cells/uL (ref 850–3900)
MCH: 34.4 pg — ABNORMAL HIGH (ref 27.0–33.0)
MCHC: 34.9 g/dL (ref 32.0–36.0)
MCV: 98.7 fL (ref 80.0–100.0)
MPV: 9.8 fL (ref 7.5–12.5)
Monocytes Relative: 6.8 %
Neutro Abs: 2369 cells/uL (ref 1500–7800)
Neutrophils Relative %: 56.4 %
Platelets: 255 10*3/uL (ref 140–400)
RBC: 3.98 10*6/uL (ref 3.80–5.10)
RDW: 11.7 % (ref 11.0–15.0)
Total Lymphocyte: 29.7 %
WBC: 4.2 10*3/uL (ref 3.8–10.8)

## 2020-01-27 LAB — THYROID PANEL WITH TSH
Free Thyroxine Index: 3 (ref 1.4–3.8)
T3 Uptake: 29 % (ref 22–35)
T4, Total: 10.4 ug/dL (ref 5.1–11.9)
TSH: 2.9 mIU/L (ref 0.40–4.50)

## 2020-01-27 LAB — LIPID PANEL
Cholesterol: 238 mg/dL — ABNORMAL HIGH (ref <200)
HDL: 61 mg/dL (ref >=50)
LDL Cholesterol (Calc): 159 mg/dL (calc) — ABNORMAL HIGH
Non-HDL Cholesterol (Calc): 177 mg/dL (calc) — ABNORMAL HIGH (ref <130)
Total CHOL/HDL Ratio: 3.9 (calc) (ref <5.0)
Triglycerides: 81 mg/dL (ref <150)

## 2020-01-27 LAB — THYROID PEROXIDASE ANTIBODY: Thyroperoxidase Ab SerPl-aCnc: 2 IU/mL (ref <9)

## 2020-01-27 LAB — HIGH SENSITIVITY CRP: hs-CRP: 0.3 mg/L

## 2020-01-27 NOTE — Telephone Encounter (Signed)
Labs show:  Thyroid labs look good.  Lipids are elevated.  Could contemplate ordering a CT Calcium Score test (see previous message).  Liver enzymes are elevated (AST, ALT).  It appears that Dr. Ardis Hughs looked into this in the past.  I would suggest rechecking in 3-4 months to be sure the numbers normalize again.  All else looks good.

## 2020-03-13 DIAGNOSIS — L700 Acne vulgaris: Secondary | ICD-10-CM | POA: Diagnosis not present

## 2020-04-06 ENCOUNTER — Telehealth (INDEPENDENT_AMBULATORY_CARE_PROVIDER_SITE_OTHER): Payer: BLUE CROSS/BLUE SHIELD | Admitting: Psychiatry

## 2020-04-06 ENCOUNTER — Encounter: Payer: Self-pay | Admitting: Psychiatry

## 2020-04-06 DIAGNOSIS — G4700 Insomnia, unspecified: Secondary | ICD-10-CM

## 2020-04-06 DIAGNOSIS — F3342 Major depressive disorder, recurrent, in full remission: Secondary | ICD-10-CM | POA: Diagnosis not present

## 2020-04-06 DIAGNOSIS — F419 Anxiety disorder, unspecified: Secondary | ICD-10-CM

## 2020-04-06 MED ORDER — CLONAZEPAM 0.5 MG PO TABS: 0.7500 mg | ORAL_TABLET | Freq: Every day | ORAL | 2 refills | Status: DC

## 2020-04-06 MED ORDER — LITHIUM CARBONATE 150 MG PO CAPS: 600.0000 mg | ORAL_CAPSULE | Freq: Every day | ORAL | 1 refills | Status: DC

## 2020-04-06 MED ORDER — VENLAFAXINE HCL ER 150 MG PO CP24: 300.0000 mg | ORAL_CAPSULE | Freq: Every day | ORAL | 1 refills | Status: DC

## 2020-04-06 NOTE — Progress Notes (Signed)
Heather Davila [id number] 07-May-1960 59 y.o.  Virtual Visit via Video Note  I connected with pt @ on 04/06/20 at  1:45 PM EST by a video enabled telemedicine application and verified that I am speaking with the correct person using two identifiers.   I discussed the limitations of evaluation and management by telemedicine and the availability of in person appointments. The patient expressed understanding and agreed to proceed.  I discussed the assessment and treatment plan with the patient. The patient was provided an opportunity to ask questions and all were answered. The patient agreed with the plan and demonstrated an understanding of the instructions.   The patient was advised to call back or seek an in-person evaluation if the symptoms worsen or if the condition fails to improve as anticipated.  I provided 30 minutes of non-face-to-face time during this encounter.  The patient was located at work.  The provider was located at [place].   Heather Davila, PMHNP   Subjective:   Patient ID:  Heather Davila is a 59 y.o. (DOB July 01, 1960) female.  Chief Complaint:  Chief Complaint  Patient presents with  . Follow-up    H/o depression, anxiety    HPI Heather Davila presents for follow-up of depression and anxiety. She reports, "I have been doing ok" however she feels that medication could be working better. She reports that she is "down more" without any apparent reason. She reports that she is noticing some continuous low mood. Energy and motivation have been ok. She reports that she is still taking care of everything that she needs to take care of. Some diminished enjoyment in things and continues to have some enjoyment and engage in these activities. She reports anxiety has been manageable. Denies any panic attacks. Some nervousness. Denies irritability. She reports that sleep has not been as good. She reports that she awakens after 4-5 hours and is unable to return to  sleep. Reports that she has about 4 days a week of not feeling well due to disturbed sleep. No change in appetite. Concentration has been ok and may be related to sleep loss. Denies SI.   Work has been going well. Denies any major psychosocial stressors. She reports that her son continues to have medical problems.   Has been taking Lithium 450 mg po QHS. She reports that she took 600 mg about 4 times this past week without seeing a significant change. Has been taking Lithium in the morning.   Past Psychiatric Medication Trials: Effexor XR- Effective, well tolerated. Has taken for about 6 months.  Brintellix - Initially effective and then was not effective when re-started Trintellix Paxil- Ineffective. Helped initially. Cymbalta- Ineffective Wellbutrin XL Abilify- Took briefly. Did not like.  Klonopin- Effective Temazepam- Took during menopause Lunesta- Ineffective Ambien-Ineffective Sonata- Ineffective Trazodone- Ineffective Remeron-Ineffective. Does not recall wt gain.  Adderall- Took once and had adverse effect. Lamictal- May have had an adverse effects."Felt weird."   Review of Systems:  Review of Systems  Gastrointestinal: Negative.   Musculoskeletal: Negative for gait problem.  Neurological: Negative for tremors.  Psychiatric/Behavioral:       Please refer to HPI    Medications: I have reviewed the patient's current medications.  Current Outpatient Medications  Medication Sig Dispense Refill  . estradiol (ESTRACE VAGINAL) 0.1 MG/GM vaginal cream 1 gram pv twice weekly at bedtime 42.5 g 3  . ibuprofen (ADVIL,MOTRIN) 200 MG tablet Take 200 mg by mouth every 6 (six) hours as needed.    Marland Kitchen MAGNESIUM PO  Take by mouth.    . Multiple Vitamin (MULTIVITAMIN) tablet Take 1 tablet by mouth daily.    . ORACEA 40 MG capsule     . valACYclovir (VALTREX) 500 MG tablet Take one twice daily for 3 days at onset of outbreak 45 tablet 12  . clonazePAM (KLONOPIN) 0.5 MG tablet Take 1.5  tablets (0.75 mg total) by mouth at bedtime. 45 tablet 2  . lithium carbonate 150 MG capsule Take 4 capsules (600 mg total) by mouth at bedtime. 270 capsule 1  . venlafaxine XR (EFFEXOR-XR) 150 MG 24 hr capsule Take 2 capsules (300 mg total) by mouth daily with breakfast. 180 capsule 1   No current facility-administered medications for this visit.    Medication Side Effects: None  Allergies:  Allergies  Allergen Reactions  . Ciprofloxacin     REACTION: hives  . Oxycodone-Acetaminophen     REACTION: hives    Past Medical History:  Diagnosis Date  . Adult acne   . Anemia   . Anorexia nervosa teen  . DEPRESSION 08/09/2009  . Insomnia   . 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  . TRANSAMINASES, SERUM, ELEVATED 08/14/2009    Family History  Problem Relation Age of Onset  . Hypertension Mother   . Colon cancer Mother 32       colon rescection  . Colon polyps Mother   . Heart disease Father 8       CABG62  . Stroke Father 82  . Heart disease Paternal Grandfather   . Heart attack Sister 5       no stents  . Depression Sister   . Heart failure Paternal Aunt   . Diabetes Paternal Uncle   . Esophageal cancer Neg Hx   . Rectal cancer Neg Hx   . Stomach cancer Neg Hx     Social History   Socioeconomic History  . Marital status: Married    Spouse name: [name]  . Number of children: 3  . Years of education: Master's  . Highest education level: Not on file  Occupational History  . Occupation: Optician, dispensing: [employer]  Tobacco Use  . Smoking status: Former Smoker    Packs/day: 0.50    Years: 5.00    Pack years: 2.50    Types: Cigarettes    Quit date: 07/10/1987    Years since quitting: 32.7  . Smokeless tobacco: Never Used  Vaping Use  . Vaping Use: Never used  Substance and Sexual Activity  . Alcohol use: No  . Drug use: No  . Sexual activity: Yes    Partners: Male    Birth control/protection:  Post-menopausal, Other-see comments    Comment: vasectomy  Other Topics Concern  . Not on file  Social History Narrative   Patient is married [name]) and lives at home with her husband and two children.   Patient has three children.   Patient works at [employer].   Patient has a Master's degree.   Patient is left-handed.   Patient drinks 6 cups of coffee daily.   Social Determinants of Health   Financial Resource Strain: Not on file  Food Insecurity: Not on file  Transportation Needs: Not on file  Physical Activity: Not on file  Stress: Not on file  Social Connections: Not on file  Intimate Partner Violence: Not on file    Past Medical History, Surgical history, Social history, and Family  history were reviewed and updated as appropriate.   Please see review of systems for further details on the patient's review from today.   Objective:   Physical Exam:  LMP 12/27/2007 (Exact Date)   Physical Exam Neurological:     Mental Status: She is alert and oriented to person, place, and time.     Cranial Nerves: No dysarthria.  Psychiatric:        Attention and Perception: Attention and perception normal.        Mood and Affect: Mood is depressed.        Speech: Speech normal.        Behavior: Behavior is cooperative.        Thought Content: Thought content normal. Thought content is not paranoid or delusional. Thought content does not include homicidal or suicidal ideation. Thought content does not include homicidal or suicidal plan.        Cognition and Memory: Cognition and memory normal.        Judgment: Judgment normal.     Comments: Insight intact     Lab Review:     Component Value Date/Time   NA 137 01/26/2020 0920   K 4.3 01/26/2020 0920   CL 99 01/26/2020 0920   CO2 27 01/26/2020 0920   GLUCOSE 84 01/26/2020 0920   BUN 13 01/26/2020 0920   CREATININE 0.68 01/26/2020 0920   CALCIUM 10.2 01/26/2020 0920   PROT 7.2 01/26/2020 0920   ALBUMIN 4.8  08/31/2018 0912   AST 91 (H) 01/26/2020 0920   ALT 194 (H) 01/26/2020 0920   ALKPHOS 75 08/31/2018 0912   BILITOT 0.5 01/26/2020 0920   GFRNONAA >90 05/01/2014 1645   GFRAA >90 05/01/2014 1645       Component Value Date/Time   WBC 4.2 01/26/2020 0920   RBC 3.98 01/26/2020 0920   HGB 13.7 01/26/2020 0920   HGB 14.1 10/26/2014 1034   HCT 39.3 01/26/2020 0920   PLT 255 01/26/2020 0920   MCV 98.7 01/26/2020 0920   MCH 34.4 (H) 01/26/2020 0920   MCHC 34.9 01/26/2020 0920   RDW 11.7 01/26/2020 0920   LYMPHSABS 1,247 01/26/2020 0920   MONOABS 0.3 08/31/2018 0912   EOSABS 218 01/26/2020 0920   BASOSABS 80 01/26/2020 0920    No results found for: POCLITH, LITHIUM   No results found for: PHENYTOIN, PHENOBARB, VALPROATE, CBMZ   .res Assessment: Plan:   Discussed potential benefits, risks, and side effects of increase in Lithium to 600 mg po qd for depression since Lithium has been helpful for her depressive s/s. Also discussed potential benefits, risks, and side effects of Remeron and discussed that it may help with augmentation of Effexor XR. She reports that she would initially like to try higher dose of Lithium to improve depression. Discussed that more time may be needed to determine response to increase in Lithium to 600 mg po QHS since she has only been at this dose for about 4 days.  She will call office in 2 weeks to update regarding response to increase in Lithium. Will send script for Lithium 600 mg po QHS at that time if depression is improved or consider adding remeron.  Continue Effexor XR 300 mg po qd for depression.  Continue Klonopin 0.75 mg po QHS.  Also discussed potential benefits, risks, and side effects of Belsomra and dayvigo to consider in the future if sleep does not improve. Pt to follow-up in 2 months or sooner if clinically indicated.  Patient advised to  contact office with any questions, adverse effects, or acute worsening in signs and symptoms.  Heather Davila was  seen today for follow-up.  Diagnoses and all orders for this visit:  Recurrent major depressive disorder, in full remission ([place]) -     lithium carbonate 150 MG capsule; Take 4 capsules (600 mg total) by mouth at bedtime. -     venlafaxine XR (EFFEXOR-XR) 150 MG 24 hr capsule; Take 2 capsules (300 mg total) by mouth daily with breakfast.  Anxiety -     venlafaxine XR (EFFEXOR-XR) 150 MG 24 hr capsule; Take 2 capsules (300 mg total) by mouth daily with breakfast. -     clonazePAM (KLONOPIN) 0.5 MG tablet; Take 1.5 tablets (0.75 mg total) by mouth at bedtime.  Insomnia, unspecified type -     clonazePAM (KLONOPIN) 0.5 MG tablet; Take 1.5 tablets (0.75 mg total) by mouth at bedtime.     Please see After Visit Summary for patient specific instructions.  Future Appointments  Date Time Provider [place]  05/29/2020  8:30 AM Heather Davila, PMHNP [facility] None    No orders of the defined types were placed in this encounter.     -------------------------------

## 2020-04-16 ENCOUNTER — Telehealth: Payer: Self-pay | Admitting: Psychiatry

## 2020-04-16 DIAGNOSIS — F3342 Major depressive disorder, recurrent, in full remission: Secondary | ICD-10-CM

## 2020-04-16 MED ORDER — LITHIUM CARBONATE 600 MG PO CAPS: 600.0000 mg | ORAL_CAPSULE | Freq: Every day | ORAL | 0 refills | Status: DC

## 2020-04-16 NOTE — Telephone Encounter (Signed)
Script sent  

## 2020-04-16 NOTE — Telephone Encounter (Signed)
Pt called and said that the  lithiun 150 mg is working when she takes 4 tabs. So please send in a new [facility]

## 2020-04-28 HISTORY — PX: FACELIFT: SHX1566

## 2020-05-07 DIAGNOSIS — H04123 Dry eye syndrome of bilateral lacrimal glands: Secondary | ICD-10-CM | POA: Diagnosis not present

## 2020-05-07 DIAGNOSIS — Z83511 Family history of glaucoma: Secondary | ICD-10-CM | POA: Diagnosis not present

## 2020-05-07 DIAGNOSIS — H43393 Other vitreous opacities, bilateral: Secondary | ICD-10-CM | POA: Diagnosis not present

## 2020-05-07 DIAGNOSIS — H2513 Age-related nuclear cataract, bilateral: Secondary | ICD-10-CM | POA: Diagnosis not present

## 2020-05-08 ENCOUNTER — Other Ambulatory Visit: Payer: Self-pay | Admitting: Psychiatry

## 2020-05-08 DIAGNOSIS — F3342 Major depressive disorder, recurrent, in full remission: Secondary | ICD-10-CM

## 2020-05-29 ENCOUNTER — Telehealth: Payer: BLUE CROSS/BLUE SHIELD | Admitting: Psychiatry

## 2020-06-08 ENCOUNTER — Telehealth (INDEPENDENT_AMBULATORY_CARE_PROVIDER_SITE_OTHER): Payer: BLUE CROSS/BLUE SHIELD | Admitting: Psychiatry

## 2020-06-08 DIAGNOSIS — F3342 Major depressive disorder, recurrent, in full remission: Secondary | ICD-10-CM

## 2020-06-08 MED ORDER — LITHIUM CARBONATE 150 MG PO CAPS: 450.0000 mg | ORAL_CAPSULE | Freq: Every day | ORAL | 0 refills | Status: DC

## 2020-06-08 MED ORDER — DEPLIN 15 15-90.314 MG PO CAPS: 15.0000 mg | ORAL_CAPSULE | Freq: Every day | ORAL | 0 refills | Status: DC

## 2020-06-08 NOTE — Progress Notes (Signed)
Heather Davila [id number] 09/03/1960 60 y.o.  Virtual Visit via Video Note  I connected with pt @ on 06/08/20 at 11:30 AM EST by a video enabled telemedicine application and verified that I am speaking with the correct person using two identifiers.   I discussed the limitations of evaluation and management by telemedicine and the availability of in person appointments. The patient expressed understanding and agreed to proceed.  I discussed the assessment and treatment plan with the patient. The patient was provided an opportunity to ask questions and all were answered. The patient agreed with the plan and demonstrated an understanding of the instructions.   The patient was advised to call back or seek an in-person evaluation if the symptoms worsen or if the condition fails to improve as anticipated.  I provided 30 minutes of non-face-to-face time during this encounter.  The patient was located at home.  The provider was located at [place].   [name], PMHNP   Subjective:   Patient ID:  Heather Davila is a 60 y.o. (DOB 1961/01/02) female.  Chief Complaint:  Chief Complaint  Patient presents with  . Depression    HPI Heather Davila presents for follow-up of depression and anxiety. She reports that initially Lithium helped and that increase in Lithium did not seem to be effective. She reports, "I wish I felt a little bit better." She reports that she does not have severe depression and tearfulness. "I feel like a need a little boost... Just not happy." She reports a low level persistent depression. Denies irritability. She reports that she has diminished excitement and enjoyment in things. She reports that she keeps busy at work. Energy and motivation has been ok. She reports that anxiety is "ok" with occasional middle of the night awakenings with anxious thoughts. She reports that her sleep has been ok overall. Appetite has not changed. She reports that her attention and  focus has been worse over time. She reports that she will jump between topics and has difficulty sustaining focus. She reports that sometimes she will forget what she went into a room to do. She does not feel like it is related to a med side effect. Denies SI.   She reports that "waves" of depression improved with Lithium. No improvement with 600 mg.   Her work has been busy due to Youth worker. She reports that her son is having some difficulties.   Past Psychiatric Medication Trials: Effexor XR- Effective, well tolerated. Has taken for about 6 months.  Trintellix - Initially effective and then was not effective when re-started Paxil- Ineffective. Helped initially. Cymbalta- Ineffective Wellbutrin XL-Ineffective.May have increased anxiety. Abilify- Took briefly. Did not like.  Klonopin- Effective Temazepam- Took during menopause Lunesta- Ineffective Ambien-Ineffective Sonata- Ineffective Trazodone- Ineffective Remeron-Ineffective. Does not recall wt gain.  Adderall- Took once and had adverse effect. Lamictal- May have had an adverse effects."Felt weird. Reports worsening s/s.   Review of Systems:  Review of Systems  Gastrointestinal: Negative.   Musculoskeletal: Negative for gait problem.  Neurological: Negative for tremors.  Psychiatric/Behavioral:       Please refer to HPI    Medications: I have reviewed the patient's current medications.  Current Outpatient Medications  Medication Sig Dispense Refill  . estradiol (ESTRACE VAGINAL) 0.1 MG/GM vaginal cream 1 gram pv twice weekly at bedtime 42.5 g 3  . ibuprofen (ADVIL,MOTRIN) 200 MG tablet Take 200 mg by mouth every 6 (six) hours as needed.    Marland Kitchen L-Methylfolate-Algae (DEPLIN 15) 15-90.314 MG CAPS  Take 15 mg by mouth daily. 30 capsule 0  . MAGNESIUM PO Take by mouth.    . Multiple Vitamin (MULTIVITAMIN) tablet Take 1 tablet by mouth daily.    . ORACEA 40 MG capsule     . valACYclovir (VALTREX) 500 MG tablet Take one twice  daily for 3 days at onset of outbreak 45 tablet 12  . venlafaxine XR (EFFEXOR-XR) 150 MG 24 hr capsule Take 2 capsules (300 mg total) by mouth daily with breakfast. 180 capsule 1  . clonazePAM (KLONOPIN) 0.5 MG tablet Take 1.5 tablets (0.75 mg total) by mouth at bedtime. 45 tablet 2  . lithium 150 MG capsule Take 3 capsules (450 mg total) by mouth at bedtime. 270 capsule 0   No current facility-administered medications for this visit.    Medication Side Effects: None  Allergies:  Allergies  Allergen Reactions  . Ciprofloxacin     REACTION: hives  . Oxycodone-Acetaminophen     REACTION: hives    Past Medical History:  Diagnosis Date  . Adult acne   . Anemia   . Anorexia nervosa teen  . DEPRESSION 08/09/2009  . Insomnia   . 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  . TRANSAMINASES, SERUM, ELEVATED 08/14/2009    Family History  Problem Relation Age of Onset  . Hypertension Mother   . Colon cancer Mother 65       colon rescection  . Colon polyps Mother   . Heart disease Father 32       CABG62  . Stroke Father 57  . Heart disease Paternal Grandfather   . Heart attack Sister 27       no stents  . Depression Sister   . Heart failure Paternal Aunt   . Diabetes Paternal Uncle   . Esophageal cancer Neg Hx   . Rectal cancer Neg Hx   . Stomach cancer Neg Hx     Social History   Socioeconomic History  . Marital status: Married    Spouse name: Heather Davila  . Number of children: 3  . Years of education: Master's  . Highest education level: Not on file  Occupational History  . Occupation: Optician, dispensing: [employer]  Tobacco Use  . Smoking status: Former Smoker    Packs/day: 0.50    Years: 5.00    Pack years: 2.50    Types: Cigarettes    Quit date: 07/10/1987    Years since quitting: 32.9  . Smokeless tobacco: Never Used  Vaping Use  . Vaping Use: Never used  Substance and Sexual Activity  . Alcohol use: No   . Drug use: No  . Sexual activity: Yes    Partners: Male    Birth control/protection: Post-menopausal, Other-see comments    Comment: vasectomy  Other Topics Concern  . Not on file  Social History Narrative   Patient is married Heather Davila) and lives at home with her husband and two children.   Patient has three children.   Patient works at [employer].   Patient has a Master's degree.   Patient is left-handed.   Patient drinks 6 cups of coffee daily.   Social Determinants of Health   Financial Resource Strain: Not on file  Food Insecurity: Not on file  Transportation Needs: Not on file  Physical Activity: Not on file  Stress: Not on file  Social Connections: Not on file  Intimate Partner Violence: Not on file  Past Medical History, Surgical history, Social history, and Family history were reviewed and updated as appropriate.   Please see review of systems for further details on the patient's review from today.   Objective:   Physical Exam:  LMP 12/27/2007 (Exact Date)   Physical Exam Neurological:     Mental Status: She is alert and oriented to person, place, and time.     Cranial Nerves: No dysarthria.  Psychiatric:        Attention and Perception: Attention and perception normal.        Mood and Affect: Mood normal.        Speech: Speech normal.        Behavior: Behavior is cooperative.        Thought Content: Thought content normal. Thought content is not paranoid or delusional. Thought content does not include homicidal or suicidal ideation. Thought content does not include homicidal or suicidal plan.        Cognition and Memory: Cognition and memory normal.        Judgment: Judgment normal.     Comments: Insight intact     Lab Review:     Component Value Date/Time   NA 137 01/26/2020 0920   K 4.3 01/26/2020 0920   CL 99 01/26/2020 0920   CO2 27 01/26/2020 0920   GLUCOSE 84 01/26/2020 0920   BUN 13 01/26/2020 0920   CREATININE 0.68  01/26/2020 0920   CALCIUM 10.2 01/26/2020 0920   PROT 7.2 01/26/2020 0920   ALBUMIN 4.8 08/31/2018 0912   AST 91 (H) 01/26/2020 0920   ALT 194 (H) 01/26/2020 0920   ALKPHOS 75 08/31/2018 0912   BILITOT 0.5 01/26/2020 0920   GFRNONAA >90 05/01/2014 1645   GFRAA >90 05/01/2014 1645       Component Value Date/Time   WBC 4.2 01/26/2020 0920   RBC 3.98 01/26/2020 0920   HGB 13.7 01/26/2020 0920   HGB 14.1 10/26/2014 1034   HCT 39.3 01/26/2020 0920   PLT 255 01/26/2020 0920   MCV 98.7 01/26/2020 0920   MCH 34.4 (H) 01/26/2020 0920   MCHC 34.9 01/26/2020 0920   RDW 11.7 01/26/2020 0920   LYMPHSABS 1,247 01/26/2020 0920   MONOABS 0.3 08/31/2018 0912   EOSABS 218 01/26/2020 0920   BASOSABS 80 01/26/2020 0920    No results found for: POCLITH, LITHIUM   No results found for: PHENYTOIN, PHENOBARB, VALPROATE, CBMZ   .res Assessment: Plan:   Patient seen for 30 minutes and time spent counseling patient regarding possible treatment options.  Discussed potential benefits, risks, and side effects of Deplin and Abilify. Discussed potential metabolic side effects associated with atypical antipsychotics, as well as potential risk for movement side effects. Advised pt to contact office if movement side effects occur.  Patient agrees to try of Deplin 15 mg po qd for augmentation of depression. Will decrease lithium to 450 mg at bedtime to use lowest possible effective dose since there was no significant improvement at the 600 mg dose. Continue Effexor XR 300 mg daily for depression and anxiety. Consider Abilify 2 mg daily.  Continue Klonopin 0.75 MG at bedtime for insomnia. Patient to follow up in 6 weeks or sooner if clinically indicated. Patient advised to contact office with any questions, adverse effects, or acute worsening in signs and symptoms.   Aleira was seen today for depression.  Diagnoses and all orders for this visit:  Recurrent major depressive disorder, in full remission  ([place]) -  lithium 150 MG capsule; Take 3 capsules (450 mg total) by mouth at bedtime. -     L-Methylfolate-Algae (DEPLIN 15) 15-90.314 MG CAPS; Take 15 mg by mouth daily.     Please see After Visit Summary for patient specific instructions.  Future Appointments  Date Time Provider [place]  11/16/2020  8:45 AM Megan Salon, MD [facility]    No orders of the defined types were placed in this encounter.     -------------------------------

## 2020-06-11 ENCOUNTER — Telehealth: Payer: BLUE CROSS/BLUE SHIELD | Admitting: Psychiatry

## 2020-06-18 ENCOUNTER — Telehealth: Payer: Self-pay | Admitting: Psychiatry

## 2020-06-18 DIAGNOSIS — F3342 Major depressive disorder, recurrent, in full remission: Secondary | ICD-10-CM

## 2020-06-18 MED ORDER — ARIPIPRAZOLE 2 MG PO TABS: 2.0000 mg | ORAL_TABLET | Freq: Every day | ORAL | 0 refills | Status: DC

## 2020-06-18 NOTE — Telephone Encounter (Signed)
Left detailed message for patient. Discussed that Abilify 2 mg po qd would be sent to her pharmacy since this was discussed during last visit. Advised her to contact office if she experiences any restlessness or involuntary movements. Discussed Abilify can be taken with or without food. Recommend taking Abilify in the morning and moving to HS if somnolence occurs. Patient advised to contact office with any questions, adverse effects, or acute worsening in signs and symptoms.

## 2020-06-18 NOTE — Telephone Encounter (Signed)
Pt called and said that the deplin is not working and she wants to talk to you about what to do next. Please give her a call at [phone]

## 2020-06-19 ENCOUNTER — Telehealth: Payer: Self-pay | Admitting: Psychiatry

## 2020-06-19 NOTE — Telephone Encounter (Signed)
Patient called in today concerned with the medications she is currently taking. She states that they just aren't working. She is taking Lithium 150mg , Effeexor XR, and just recently began Abilify, but was concerned that this wasn't the right medication she should be taking. She would like for you to please give her a call. Ph: [phone]

## 2020-06-19 NOTE — Telephone Encounter (Signed)
Returned call to pt. She reports that she is not sure about re-starting Abilify. She notices she is grinding her teeth and now thinks that she may have some anxiety. Her dentist noticed that she has been grinding her teeth. She reports that she is also requesting a medication that would target anxiety. Denies irritability. She reports that she is concerned about taking multiple medications.   She reports that Lithium has been helpful for "waves" of depression. She reports that Effexor continues to be helpful.   She reports that depression is her chief complaint.   She reports that she had [place] in the past and did not respond.  Discussed treatment options. Discussed that she is currently on maximum dose of Effexor and feels that she is receiving some benefit. Dicsussed that she has tried other medications for augmentation with either side effects or no improvement, ie Lamictal and Wellbutrin XL. Discussed that Abilify is approved for add on to an antidepressant for MDD. Discussed that it is not indicated for anxiety, however some people feel that it has helped both depression and anxiety.   Pt agrees to 2 week trial of Abilify and will contact office if no improvement or if this is not well tolerated. Patient advised to contact office with any questions, adverse effects, or acute worsening in signs and symptoms.

## 2020-07-03 ENCOUNTER — Other Ambulatory Visit: Payer: Self-pay | Admitting: Psychiatry

## 2020-07-03 DIAGNOSIS — F3342 Major depressive disorder, recurrent, in full remission: Secondary | ICD-10-CM

## 2020-07-06 ENCOUNTER — Telehealth: Payer: Self-pay | Admitting: Psychiatry

## 2020-07-06 DIAGNOSIS — F3342 Major depressive disorder, recurrent, in full remission: Secondary | ICD-10-CM

## 2020-07-06 MED ORDER — ARIPIPRAZOLE 2 MG PO TABS: 2.0000 mg | ORAL_TABLET | Freq: Every day | ORAL | 0 refills | Status: DC

## 2020-07-06 NOTE — Telephone Encounter (Signed)
Sent!

## 2020-07-06 NOTE — Telephone Encounter (Signed)
Next visit is 07/25/20. Heather Davila states Abilify is working well for her. She would like a RX sent to  [facility], [place] Phone:  [phone]  Fax:  [phone]

## 2020-07-25 ENCOUNTER — Encounter: Payer: Self-pay | Admitting: Psychiatry

## 2020-07-25 ENCOUNTER — Telehealth (INDEPENDENT_AMBULATORY_CARE_PROVIDER_SITE_OTHER): Payer: BLUE CROSS/BLUE SHIELD | Admitting: Psychiatry

## 2020-07-25 DIAGNOSIS — G4700 Insomnia, unspecified: Secondary | ICD-10-CM | POA: Diagnosis not present

## 2020-07-25 DIAGNOSIS — F419 Anxiety disorder, unspecified: Secondary | ICD-10-CM | POA: Diagnosis not present

## 2020-07-25 DIAGNOSIS — F3342 Major depressive disorder, recurrent, in full remission: Secondary | ICD-10-CM | POA: Diagnosis not present

## 2020-07-25 MED ORDER — VENLAFAXINE HCL ER 150 MG PO CP24: 300.0000 mg | ORAL_CAPSULE | Freq: Every day | ORAL | 1 refills | Status: DC

## 2020-07-25 MED ORDER — ARIPIPRAZOLE 2 MG PO TABS: 2.0000 mg | ORAL_TABLET | Freq: Every day | ORAL | 0 refills | Status: DC

## 2020-07-25 MED ORDER — LITHIUM CARBONATE 150 MG PO CAPS
450.0000 mg | ORAL_CAPSULE | Freq: Every day | ORAL | 0 refills | Status: DC
Start: 2020-07-25 — End: 2020-09-27

## 2020-07-25 NOTE — Progress Notes (Signed)
Heather Davila [id number] [birth year] 60 y.o.  Virtual Visit via Telephone Note  I connected with pt on 07/25/20 at  1:45 PM EDT by telephone and verified that I am speaking with the correct person using two identifiers.   I discussed the limitations, risks, security and privacy concerns of performing an evaluation and management service by telephone and the availability of in person appointments. I also discussed with the patient that there may be a patient responsible charge related to this service. The patient expressed understanding and agreed to proceed.   I discussed the assessment and treatment plan with the patient. The patient was provided an opportunity to ask questions and all were answered. The patient agreed with the plan and demonstrated an understanding of the instructions.   The patient was advised to call back or seek an in-person evaluation if the symptoms worsen or if the condition fails to improve as anticipated.  I provided 17 minutes of non-face-to-face time during this encounter.  The patient was located at home.  The provider was located at [place].   [name], PMHNP   Subjective:   Patient ID:  Heather Davila is a 60 y.o. (DOB 11/27/1960) female.  Chief Complaint:  Chief Complaint  Patient presents with  . Follow-up    Depression, Anxiety  . Insomnia    HPI [name] presents for follow-up of depression, anxiety, and insomnia. She reports that Abilify "was the best medication of anything I have tried... like before I was ever depressed." She reports that she was only sleeping 4-5 hours a night. She reports that she was able to fall asleep without difficulty and then would wake up after 5 hours. She reports that she did not feel restless. She reports that she did not experience any other side effects. She reports that anxiety did not increase.  She reports that she stopped Abilify 5 days ago and sleep has not significantly improved. She  reports that she slept for the first 3 days and is now having early morning awakenings again. She reports that mood may have worsened "a little bit" since stopping Abilify. She reports that she has been grinding her teeth. She reports that she has some anxiety and anxiety tends to correlate with depression. Appetite has been good. Energy and motivation have been ok. She reports that concentration is consistent with baseline. Denies SI.   Past Psychiatric Medication Trials: Effexor XR- Effective, well tolerated. Has taken for about 6 months.  Trintellix - Initially effective and then was not effective when re-started Paxil- Ineffective. Helped initially. Cymbalta- Ineffective Wellbutrin XL-Ineffective.May have increased anxiety. Abilify- Took briefly. Did not like.  Klonopin- Effective Temazepam- Took during menopause Lunesta- Ineffective Ambien-Ineffective Sonata- Ineffective Trazodone- Ineffective Remeron-Ineffective. Does not recall wt gain. Adderall- Took once and had adverse effect. Lamictal- May have had an adverse effects."Felt weird. Reports worsening s/s.   Review of Systems:  Review of Systems  Musculoskeletal: Negative for gait problem.  Neurological: Negative for tremors.  Psychiatric/Behavioral:       Please refer to HPI    Medications: I have reviewed the patient's current medications.  Current Outpatient Medications  Medication Sig Dispense Refill  . estradiol (ESTRACE VAGINAL) 0.1 MG/GM vaginal cream 1 gram pv twice weekly at bedtime 42.5 g 3  . ibuprofen (ADVIL,MOTRIN) 200 MG tablet Take 200 mg by mouth every 6 (six) hours as needed.    Marland Kitchen MAGNESIUM PO Take by mouth.    . Multiple Vitamin (MULTIVITAMIN) tablet Take 1 tablet by  mouth daily.    . ORACEA 40 MG capsule     . valACYclovir (VALTREX) 500 MG tablet Take one twice daily for 3 days at onset of outbreak 45 tablet 12  . ARIPiprazole (ABILIFY) 2 MG tablet Take 1 tablet (2 mg total) by mouth daily. 90 tablet 0   . clonazePAM (KLONOPIN) 0.5 MG tablet Take 1.5 tablets (0.75 mg total) by mouth at bedtime. 45 tablet 2  . lithium carbonate 150 MG capsule Take 3 capsules (450 mg total) by mouth at bedtime. 270 capsule 0  . venlafaxine XR (EFFEXOR-XR) 150 MG 24 hr capsule Take 2 capsules (300 mg total) by mouth daily with breakfast. 180 capsule 1   No current facility-administered medications for this visit.    Medication Side Effects: None  Allergies:  Allergies  Allergen Reactions  . Ciprofloxacin     REACTION: hives  . Oxycodone-Acetaminophen     REACTION: hives    Past Medical History:  Diagnosis Date  . Adult acne   . Anemia   . Anorexia nervosa teen  . DEPRESSION 08/09/2009  . Insomnia   . 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  . TRANSAMINASES, SERUM, ELEVATED 08/14/2009    Family History  Problem Relation Age of Onset  . Hypertension Mother   . Colon cancer Mother 58       colon rescection  . Colon polyps Mother   . Heart disease Father 90       CABG62  . Stroke Father 72  . Heart disease Paternal Grandfather   . Heart attack Sister 71       no stents  . Depression Sister   . Heart failure Paternal Aunt   . Diabetes Paternal Uncle   . Esophageal cancer Neg Hx   . Rectal cancer Neg Hx   . Stomach cancer Neg Hx     Social History   Socioeconomic History  . Marital status: Married    Spouse name: Heather Davila  . Number of children: 3  . Years of education: Master's  . Highest education level: Not on file  Occupational History  . Occupation: Optician, dispensing: [employer]  Tobacco Use  . Smoking status: Former Smoker    Packs/day: 0.50    Years: 5.00    Pack years: 2.50    Types: Cigarettes    Quit date: 07/10/1987    Years since quitting: 33.0  . Smokeless tobacco: Never Used  Vaping Use  . Vaping Use: Never used  Substance and Sexual Activity  . Alcohol use: No  . Drug use: No  . Sexual activity: Yes     Partners: Male    Birth control/protection: Post-menopausal, Other-see comments    Comment: vasectomy  Other Topics Concern  . Not on file  Social History Narrative   Patient is married Heather Davila) and lives at home with her husband and two children.   Patient has three children.   Patient works at [employer].   Patient has a Master's degree.   Patient is left-handed.   Patient drinks 6 cups of coffee daily.   Social Determinants of Health   Financial Resource Strain: Not on file  Food Insecurity: Not on file  Transportation Needs: Not on file  Physical Activity: Not on file  Stress: Not on file  Social Connections: Not on file  Intimate Partner Violence: Not on file    Past Medical History, Surgical history,  Social history, and Family history were reviewed and updated as appropriate.   Please see review of systems for further details on the patient's review from today.   Objective:   Physical Exam:  LMP 12/27/2007 (Exact Date)   Physical Exam Constitutional:      General: She is not in acute distress. Musculoskeletal:        General: No deformity.  Neurological:     Mental Status: She is alert and oriented to person, place, and time.     Coordination: Coordination normal.  Psychiatric:        Attention and Perception: Attention and perception normal. She does not perceive auditory or visual hallucinations.        Mood and Affect: Mood normal. Mood is not anxious or depressed. Affect is not labile, blunt, angry or inappropriate.        Speech: Speech normal.        Behavior: Behavior normal.        Thought Content: Thought content normal. Thought content is not paranoid or delusional. Thought content does not include homicidal or suicidal ideation. Thought content does not include homicidal or suicidal plan.        Cognition and Memory: Cognition and memory normal.        Judgment: Judgment normal.     Comments: Insight intact     Lab Review:      Component Value Date/Time   NA 137 01/26/2020 0920   K 4.3 01/26/2020 0920   CL 99 01/26/2020 0920   CO2 27 01/26/2020 0920   GLUCOSE 84 01/26/2020 0920   BUN 13 01/26/2020 0920   CREATININE 0.68 01/26/2020 0920   CALCIUM 10.2 01/26/2020 0920   PROT 7.2 01/26/2020 0920   ALBUMIN 4.8 08/31/2018 0912   AST 91 (H) 01/26/2020 0920   ALT 194 (H) 01/26/2020 0920   ALKPHOS 75 08/31/2018 0912   BILITOT 0.5 01/26/2020 0920   GFRNONAA >90 05/01/2014 1645   GFRAA >90 05/01/2014 1645       Component Value Date/Time   WBC 4.2 01/26/2020 0920   RBC 3.98 01/26/2020 0920   HGB 13.7 01/26/2020 0920   HGB 14.1 10/26/2014 1034   HCT 39.3 01/26/2020 0920   PLT 255 01/26/2020 0920   MCV 98.7 01/26/2020 0920   MCH 34.4 (H) 01/26/2020 0920   MCHC 34.9 01/26/2020 0920   RDW 11.7 01/26/2020 0920   LYMPHSABS 1,247 01/26/2020 0920   MONOABS 0.3 08/31/2018 0912   EOSABS 218 01/26/2020 0920   BASOSABS 80 01/26/2020 0920    No results found for: POCLITH, LITHIUM   No results found for: PHENYTOIN, PHENOBARB, VALPROATE, CBMZ   .res Assessment: Plan:   Pt reports that she would like to start re-trial of Abilify since it has been most helpful for her depressive s/s. Discussed taking Abilify 2 mg every other day to possibly minimize side effects and maintain benefit. Discussed that Abilify has a long half life and can be taken every other day without difficulty.  Will continue Effexor XR 300 mg po qd for depression and anxiety.  Continue Lithium 450 mg po QHS for depression. Pt to follow-up in 1-2 months or sooner if clinically indicated.  Patient advised to contact office with any questions, adverse effects, or acute worsening in signs and symptoms.  Amberlie was seen today for follow-up and insomnia.  Diagnoses and all orders for this visit:  Insomnia, unspecified type  Recurrent major depressive disorder, in full remission ([place]) -  ARIPiprazole (ABILIFY) 2 MG tablet; Take 1 tablet (2 mg  total) by mouth daily. -     lithium carbonate 150 MG capsule; Take 3 capsules (450 mg total) by mouth at bedtime. -     venlafaxine XR (EFFEXOR-XR) 150 MG 24 hr capsule; Take 2 capsules (300 mg total) by mouth daily with breakfast.  Anxiety -     venlafaxine XR (EFFEXOR-XR) 150 MG 24 hr capsule; Take 2 capsules (300 mg total) by mouth daily with breakfast.    Please see After Visit Summary for patient specific instructions.  Future Appointments  Date Time Provider [place]  08/06/2020  8:00 AM Dillingham, Loel Lofty, DO [facility] None  09/06/2020  9:00 AM [name], PMHNP [facility] None  11/16/2020  8:45 AM Megan Salon, MD [facility]    No orders of the defined types were placed in this encounter.     -------------------------------

## 2020-08-01 ENCOUNTER — Telehealth: Payer: Self-pay | Admitting: Psychiatry

## 2020-08-01 NOTE — Telephone Encounter (Signed)
Heather Davila called to report that she thinks her effexor needs to be changed.  Things are just off.  She hasn't been sleeping well for awhile.  She just feels it isn't working anymore.  Please call to discuss.  appt 5/12

## 2020-08-01 NOTE — Telephone Encounter (Signed)
Please review

## 2020-08-06 ENCOUNTER — Encounter: Payer: Self-pay | Admitting: Plastic Surgery

## 2020-08-06 ENCOUNTER — Institutional Professional Consult (permissible substitution): Payer: BLUE CROSS/BLUE SHIELD | Admitting: Plastic Surgery

## 2020-08-06 ENCOUNTER — Ambulatory Visit (INDEPENDENT_AMBULATORY_CARE_PROVIDER_SITE_OTHER): Payer: Self-pay | Admitting: Plastic Surgery

## 2020-08-06 ENCOUNTER — Other Ambulatory Visit: Payer: Self-pay

## 2020-08-06 DIAGNOSIS — Z719 Counseling, unspecified: Secondary | ICD-10-CM

## 2020-08-06 NOTE — Progress Notes (Addendum)
Patient ID: Heather Davila, female    DOB: Jul 25, 1960, 60 y.o.   MRN: [id number]   Chief Complaint  Patient presents with   Advice Only    The patient is a 60 year old female here for evaluation of her face.  She is 5 feet tall.  A mini facelift with Dr. Dessie Coma in the office about 6 years ago.  She does not like her nasolabial folds that have excess tissue and a deep line.  She has loss of volume in her midface her overall skin texture is quite nice.  She has a little bit of jowling on each side.  Her past medical history is positive for depression and she has had a breast augmentation, C-section and hysteroscopy.  She is not a smoker.  She is interested in a facelift.   Review of Systems  Constitutional: Negative.  Negative for activity change and appetite change.  HENT: Negative.   Eyes: Negative.   Respiratory: Negative.  Negative for chest tightness.   Cardiovascular: Negative.  Negative for leg swelling.  Gastrointestinal: Negative for abdominal distention.  Endocrine: Negative.   Genitourinary: Negative.   Musculoskeletal: Negative.   Hematological: Negative.   Psychiatric/Behavioral: Negative.     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   TRANSAMINASES, SERUM, ELEVATED 08/14/2009    Past Surgical History:  Procedure Laterality Date   AUGMENTATION MAMMAPLASTY Bilateral    BREAST ENHANCEMENT SURGERY Bilateral    CESAREAN SECTION     X 3   DILITATION & CURRETTAGE/HYSTROSCOPY WITH HYDROTHERMAL ABLATION     ENDOMETRIAL ABLATION  12/27/07   HYSTEROSCOPY  12/27/07   and HTA   UPPER GASTROINTESTINAL ENDOSCOPY  2020      Current Outpatient Medications:    ARIPiprazole (ABILIFY) 2 MG tablet, Take 1 tablet (2 mg total) by mouth daily., Disp: 90 tablet, Rfl: 0   estradiol (ESTRACE VAGINAL) 0.1 MG/GM  vaginal cream, 1 gram pv twice weekly at bedtime, Disp: 42.5 g, Rfl: 3   ibuprofen (ADVIL,MOTRIN) 200 MG tablet, Take 200 mg by mouth every 6 (six) hours as needed., Disp: , Rfl:    lithium carbonate 150 MG capsule, Take 3 capsules (450 mg total) by mouth at bedtime., Disp: 270 capsule, Rfl: 0   MAGNESIUM PO, Take by mouth., Disp: , Rfl:    Multiple Vitamin (MULTIVITAMIN) tablet, Take 1 tablet by mouth daily., Disp: , Rfl:    ORACEA 40 MG capsule, , Disp: , Rfl:    valACYclovir (VALTREX) 500 MG tablet, Take one twice daily for 3 days at onset of outbreak, Disp: 45 tablet, Rfl: 12   venlafaxine XR (EFFEXOR-XR) 150 MG 24 hr capsule, Take 2 capsules (300 mg total) by mouth daily with breakfast., Disp: 180 capsule, Rfl: 1   clonazePAM (KLONOPIN) 0.5 MG tablet, Take 1.5 tablets (0.75 mg total) by mouth at bedtime., Disp: 45 tablet, Rfl: 2   Objective:   Vitals:   08/06/20 0817  BP: 101/68  Pulse: 61  SpO2: (!) 86%    Physical Exam Vitals and nursing note reviewed.  Constitutional:      Appearance: Normal appearance.  HENT:     Head: Normocephalic and atraumatic.  Cardiovascular:     Rate and Rhythm: Normal rate.     Pulses: Normal pulses.  Pulmonary:     Effort: Pulmonary effort is normal.  Abdominal:     General: Abdomen is flat. There is no distension.  Musculoskeletal:        General: No swelling or deformity.  Skin:    General: Skin is warm.     Capillary Refill: Capillary refill takes less than 2 seconds.     Coloration: Skin is not jaundiced.     Findings: No bruising.  Neurological:     General: No focal deficit present.     Mental Status: She is alert and oriented to person, place, and time.  Psychiatric:        Mood and Affect: Mood normal.        Behavior: Behavior normal.        Thought Content: Thought content normal.     Assessment & Plan:  Encounter for counseling  The patient is a good candidate for a facelift.  We talked about the options for fillers,  Botox and laser.  These would all be of benefit but certainly not as gratifying as the facelift.  She would like a quote for a mini facelift and a full facelift. Pictures were obtained of the patient and placed in the chart with the patient's or guardian's permission.   [place], DO

## 2020-08-07 ENCOUNTER — Ambulatory Visit (HOSPITAL_BASED_OUTPATIENT_CLINIC_OR_DEPARTMENT_OTHER): Payer: BLUE CROSS/BLUE SHIELD | Admitting: Obstetrics & Gynecology

## 2020-08-09 ENCOUNTER — Telehealth: Payer: Self-pay | Admitting: Psychiatry

## 2020-08-09 DIAGNOSIS — F900 Attention-deficit hyperactivity disorder, predominantly inattentive type: Secondary | ICD-10-CM

## 2020-08-09 MED ORDER — METHYLPHENIDATE HCL ER (OSM) 27 MG PO TBCR
27.0000 mg | EXTENDED_RELEASE_TABLET | ORAL | 0 refills | Status: DC
Start: 2020-08-09 — End: 2020-08-17

## 2020-08-09 NOTE — Telephone Encounter (Signed)
Heather Davila called in today stating that she has been working from home this week and has a trouble concentrating. She would like to know if she could be place on some kind of ADHD medicine to try before her next appt on 5/12. She also said that her son is on Concerta and asked if that might be an option or something else would be fine. Pls call [phone]

## 2020-08-10 ENCOUNTER — Encounter (HOSPITAL_BASED_OUTPATIENT_CLINIC_OR_DEPARTMENT_OTHER): Payer: Self-pay

## 2020-08-13 ENCOUNTER — Ambulatory Visit (HOSPITAL_BASED_OUTPATIENT_CLINIC_OR_DEPARTMENT_OTHER): Payer: BLUE CROSS/BLUE SHIELD | Admitting: Obstetrics & Gynecology

## 2020-08-14 ENCOUNTER — Institutional Professional Consult (permissible substitution): Payer: BLUE CROSS/BLUE SHIELD | Admitting: Plastic Surgery

## 2020-08-17 ENCOUNTER — Telehealth: Payer: Self-pay | Admitting: Psychiatry

## 2020-08-17 DIAGNOSIS — F900 Attention-deficit hyperactivity disorder, predominantly inattentive type: Secondary | ICD-10-CM

## 2020-08-17 MED ORDER — METHYLPHENIDATE HCL ER (OSM) 36 MG PO TBCR: 36.0000 mg | EXTENDED_RELEASE_TABLET | ORAL | 0 refills | Status: DC

## 2020-08-17 NOTE — Telephone Encounter (Signed)
Pt informed

## 2020-08-17 NOTE — Telephone Encounter (Signed)
Please review

## 2020-08-17 NOTE — Telephone Encounter (Signed)
Please let her know it was sent. Insurance may not cover it yet since she just filled #[phone] last week.

## 2020-08-17 NOTE — Telephone Encounter (Signed)
Patient lm stating the Concerta at the lowest dose was well tolerated. She is requesting an increase per your suggestion. Please advise. # [id number].

## 2020-08-28 ENCOUNTER — Other Ambulatory Visit: Payer: Self-pay

## 2020-08-28 ENCOUNTER — Telehealth (INDEPENDENT_AMBULATORY_CARE_PROVIDER_SITE_OTHER): Payer: BLUE CROSS/BLUE SHIELD | Admitting: Psychiatry

## 2020-08-28 ENCOUNTER — Encounter: Payer: Self-pay | Admitting: Psychiatry

## 2020-08-28 DIAGNOSIS — F3342 Major depressive disorder, recurrent, in full remission: Secondary | ICD-10-CM | POA: Diagnosis not present

## 2020-08-28 DIAGNOSIS — F900 Attention-deficit hyperactivity disorder, predominantly inattentive type: Secondary | ICD-10-CM | POA: Diagnosis not present

## 2020-08-28 DIAGNOSIS — F419 Anxiety disorder, unspecified: Secondary | ICD-10-CM | POA: Diagnosis not present

## 2020-08-28 MED ORDER — REXULTI 0.5 MG PO TABS: 0.5000 mg | ORAL_TABLET | Freq: Every day | ORAL | 0 refills | Status: DC

## 2020-08-28 MED ORDER — VENLAFAXINE HCL ER 150 MG PO CP24: ORAL_CAPSULE | ORAL | 1 refills | Status: DC

## 2020-08-28 MED ORDER — PAROXETINE HCL 40 MG PO TABS: ORAL_TABLET | ORAL | 0 refills | Status: DC

## 2020-08-28 MED ORDER — METHYLPHENIDATE HCL ER (OSM) 36 MG PO TBCR: 36.0000 mg | EXTENDED_RELEASE_TABLET | ORAL | 0 refills | Status: DC

## 2020-08-28 NOTE — Progress Notes (Signed)
Heather Davila [id number] Nov 28, 1960 59 y.o.  Virtual Visit via Video Note  I connected with pt @ on 08/28/20 at 11:00 AM EDT by a video enabled telemedicine application and verified that I am speaking with the correct person using two identifiers.   I discussed the limitations of evaluation and management by telemedicine and the availability of in person appointments. The patient expressed understanding and agreed to proceed.  I discussed the assessment and treatment plan with the patient. The patient was provided an opportunity to ask questions and all were answered. The patient agreed with the plan and demonstrated an understanding of the instructions.   The patient was advised to call back or seek an in-person evaluation if the symptoms worsen or if the condition fails to improve as anticipated.  I provided 30 minutes of non-face-to-face time during this encounter.  The patient was located at home.  The provider was located at [place].   [name], PMHNP   Subjective:   Patient ID:  Heather Davila is a 60 y.o. (DOB 1960/10/13) female.  Chief Complaint:  Chief Complaint  Patient presents with  . ADHD  . Depression  . Anxiety    HPI Heather Davila presents for follow-up of ADHD, Depression, and anxiety. She reports that she feels Effexor is not working well. She has been grinding her teeth a lot and chewing gum and reports that this started before Abilify and Concerta. She reports that she weaned herself off Abilify since she was not sleeping longer than 4 hours. She reports that she started sleeping again after not taking it for 10 days. Now sleeping about 7 hours a night. She reports that Abilify was helpful for her depression. She reports that she has been having persistent sad mood. Denies panic s/s. She reports that Concerta has been helpful for concentration. Energy and motivation are better when she takes Concerta and is more productive. Energy and motivation  have been lower with depression. Appetite has been good and denies appetite suppression. Denies SI.   Paxil seemed to be more effective than others.   Past Psychiatric Medication Trials: Effexor XR- Effective, well tolerated.  Trintellix - Initially effective and then was not effective when re-started Paxil- Ineffective. Helped initially. Prozac-Insomnia Lexapro- Effective and then no longer as effective Zoloft- Initially effective and then not as effective.  Cymbalta- Ineffective. Helped initially. Wellbutrin XL-Ineffective.May have increased anxiety. Amitriptyline Nortriptyline Abilify- Helpful but caused severe insomnia.  Seroquel - Ineffective Risperdal Olanzapine- Ineffective Klonopin- Effective Temazepam- Took during menopause Lunesta- Ineffective Ambien-Ineffective Sonata- Ineffective Trazodone- Ineffective Remeron-Ineffective. Does not recall wt gain. Adderall- Took once and had adverse effect. Lamictal- May have had an adverse effects."Felt weird."Reports worsening s/s. Lithium   Review of Systems:  Review of Systems  Cardiovascular: Negative for palpitations.  Musculoskeletal: Negative for gait problem.  Neurological: Negative for tremors and headaches.  Psychiatric/Behavioral:       Please refer to HPI    Medications: I have reviewed the patient's current medications.  Current Outpatient Medications  Medication Sig Dispense Refill  . Brexpiprazole (REXULTI) 0.5 MG TABS Take 1 tablet (0.5 mg total) by mouth daily. 30 tablet 0  . clonazePAM (KLONOPIN) 0.5 MG tablet Take 1.5 tablets (0.75 mg total) by mouth at bedtime. 45 tablet 2  . estradiol (ESTRACE VAGINAL) 0.1 MG/GM vaginal cream 1 gram pv twice weekly at bedtime 42.5 g 3  . ibuprofen (ADVIL,MOTRIN) 200 MG tablet Take 200 mg by mouth every 6 (six) hours as needed.    Marland Kitchen  lithium carbonate 150 MG capsule Take 3 capsules (450 mg total) by mouth at bedtime. 270 capsule 0  . MAGNESIUM PO Take by mouth.     Derrill Memo ON 09/14/2020] methylphenidate (CONCERTA) 36 MG PO CR tablet Take 1 tablet (36 mg total) by mouth every morning. 30 tablet 0  . Multiple Vitamin (MULTIVITAMIN) tablet Take 1 tablet by mouth daily.    . ORACEA 40 MG capsule     . PARoxetine (PAXIL) 40 MG tablet Take 1/2 tablet daily for one week, then increase to 1 tablet daily 30 tablet 0  . valACYclovir (VALTREX) 500 MG tablet Take one twice daily for 3 days at onset of outbreak 45 tablet 12  . venlafaxine XR (EFFEXOR-XR) 150 MG 24 hr capsule Decrease to 1 capsule daily for one week, then stop 180 capsule 1   No current facility-administered medications for this visit.    Medication Side Effects: None  Allergies:  Allergies  Allergen Reactions  . Ciprofloxacin     REACTION: hives  . Oxycodone-Acetaminophen     REACTION: hives    Past Medical History:  Diagnosis Date  . Adult acne   . Anemia   . Anorexia nervosa teen  . DEPRESSION 08/09/2009  . Insomnia   . 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  . TRANSAMINASES, SERUM, ELEVATED 08/14/2009    Family History  Problem Relation Age of Onset  . Hypertension Mother   . Colon cancer Mother 78       colon rescection  . Colon polyps Mother   . Heart disease Father 28       CABG62  . Stroke Father 61  . Heart disease Paternal Grandfather   . Heart attack Sister 18       no stents  . Depression Sister   . Heart failure Paternal Aunt   . Diabetes Paternal Uncle   . Esophageal cancer Neg Hx   . Rectal cancer Neg Hx   . Stomach cancer Neg Hx     Social History   Socioeconomic History  . Marital status: Married    Spouse name: [name]  . Number of children: 3  . Years of education: Master's  . Highest education level: Not on file  Occupational History  . Occupation: Optician, dispensing: [employer]  Tobacco Use  . Smoking status: Former Smoker    Packs/day: 0.50    Years: 5.00    Pack years: 2.50     Types: Cigarettes    Quit date: 07/10/1987    Years since quitting: 33.1  . Smokeless tobacco: Never Used  Vaping Use  . Vaping Use: Never used  Substance and Sexual Activity  . Alcohol use: No  . Drug use: No  . Sexual activity: Yes    Partners: Male    Birth control/protection: Post-menopausal, Other-see comments    Comment: vasectomy  Other Topics Concern  . Not on file  Social History Narrative   Patient is married [name]) and lives at home with her husband and two children.   Patient has three children.   Patient works at [employer].   Patient has a Master's degree.   Patient is left-handed.   Patient drinks 6 cups of coffee daily.   Social Determinants of Health   Financial Resource Strain: Not on file  Food Insecurity: Not on file  Transportation Needs: Not on file  Physical Activity: Not on file  Stress: Not on file  Social Connections: Not on file  Intimate Partner Violence: Not on file    Past Medical History, Surgical history, Social history, and Family history were reviewed and updated as appropriate.   Please see review of systems for further details on the patient's review from today.   Objective:   Physical Exam:  LMP 12/27/2007 (Exact Date)   Physical Exam Neurological:     Mental Status: She is alert and oriented to person, place, and time.     Cranial Nerves: No dysarthria.  Psychiatric:        Attention and Perception: Attention and perception normal.        Mood and Affect: Mood is anxious and depressed.        Speech: Speech normal.        Behavior: Behavior is cooperative.        Thought Content: Thought content normal. Thought content is not paranoid or delusional. Thought content does not include homicidal or suicidal ideation. Thought content does not include homicidal or suicidal plan.        Cognition and Memory: Cognition and memory normal.        Judgment: Judgment normal.     Comments: Insight intact     Lab Review:      Component Value Date/Time   NA 137 01/26/2020 0920   K 4.3 01/26/2020 0920   CL 99 01/26/2020 0920   CO2 27 01/26/2020 0920   GLUCOSE 84 01/26/2020 0920   BUN 13 01/26/2020 0920   CREATININE 0.68 01/26/2020 0920   CALCIUM 10.2 01/26/2020 0920   PROT 7.2 01/26/2020 0920   ALBUMIN 4.8 08/31/2018 0912   AST 91 (H) 01/26/2020 0920   ALT 194 (H) 01/26/2020 0920   ALKPHOS 75 08/31/2018 0912   BILITOT 0.5 01/26/2020 0920   GFRNONAA >90 05/01/2014 1645   GFRAA >90 05/01/2014 1645       Component Value Date/Time   WBC 4.2 01/26/2020 0920   RBC 3.98 01/26/2020 0920   HGB 13.7 01/26/2020 0920   HGB 14.1 10/26/2014 1034   HCT 39.3 01/26/2020 0920   PLT 255 01/26/2020 0920   MCV 98.7 01/26/2020 0920   MCH 34.4 (H) 01/26/2020 0920   MCHC 34.9 01/26/2020 0920   RDW 11.7 01/26/2020 0920   LYMPHSABS 1,247 01/26/2020 0920   MONOABS 0.3 08/31/2018 0912   EOSABS 218 01/26/2020 0920   BASOSABS 80 01/26/2020 0920    No results found for: POCLITH, LITHIUM   No results found for: PHENYTOIN, PHENOBARB, VALPROATE, CBMZ   .res Assessment: Plan:    Pt seen for 30 minutes and time spent counseling pt regarding treatment options for depression and anxiety to include augmentation with Rexulti since it has a similar mechanism of action to Abilify and may not cause sleep disturbance. Pt agrees to trial of Rexulti. Will start start Rexulti 0.5 mg po qd for augmentation of depression.  Discussed re-trial of Paxil since pt reports that Paxil was most effective for her in the past. Pt reports that she would like to switch from Effexor XR to Paxil along with starting [place]. Will decrease Effexor XR to 150 mg po qd for one week, then stop. Will start Paxil 20 mg po qd for one week, then increase to 40 mg daily for anxiety and depression.  Continue Lithium 450 mg po qhs for mood s/s.  Continue Klonopin 0.75 mg po QHS for insomnia.  Continue Concerta 36 mg po qd for ADHD.  Pt to follow-up in 1 month  or sooner if clinically indicated.  Patient advised to contact office with any questions, adverse effects, or acute worsening in signs and symptoms.   Mckynzie was seen today for adhd, depression and anxiety.  Diagnoses and all orders for this visit:  Recurrent major depressive disorder, in full remission ([place]) -     Discontinue: Brexpiprazole (REXULTI) 0.5 MG TABS; Take 1 tablet (0.5 mg total) by mouth daily. -     Discontinue: PARoxetine (PAXIL) 40 MG tablet; Take 1/2 tablet daily for one week, then increase to 1 tablet daily -     venlafaxine XR (EFFEXOR-XR) 150 MG 24 hr capsule; Decrease to 1 capsule daily for one week, then stop -     Discontinue: Brexpiprazole (REXULTI) 0.5 MG TABS; Take 1 tablet (0.5 mg total) by mouth daily.  Anxiety -     Discontinue: PARoxetine (PAXIL) 40 MG tablet; Take 1/2 tablet daily for one week, then increase to 1 tablet daily -     venlafaxine XR (EFFEXOR-XR) 150 MG 24 hr capsule; Decrease to 1 capsule daily for one week, then stop  Attention deficit hyperactivity disorder (ADHD), predominantly inattentive type -     methylphenidate (CONCERTA) 36 MG PO CR tablet; Take 1 tablet (36 mg total) by mouth every morning.     Please see After Visit Summary for patient specific instructions.  Future Appointments  Date Time Provider [place]  09/27/2020  3:00 PM [name], PMHNP CP-CP None  09/28/2020  8:00 AM Scheeler, Carola Rhine, PA-C PSS[facility]PSS None  10/16/2020  8:20 AM Scheeler, Carola Rhine, PA-C PSS[facility]PSS None  10/23/2020  8:15 AM Dillingham, Loel Lofty, DO PSS[facility]PSS None  11/16/2020  8:45 AM Megan Salon, MD [facility]    No orders of the defined types were placed in this encounter.     -------------------------------

## 2020-08-29 ENCOUNTER — Telehealth: Payer: Self-pay | Admitting: Psychiatry

## 2020-08-29 DIAGNOSIS — F419 Anxiety disorder, unspecified: Secondary | ICD-10-CM

## 2020-08-29 DIAGNOSIS — F3342 Major depressive disorder, recurrent, in full remission: Secondary | ICD-10-CM

## 2020-08-29 MED ORDER — REXULTI 0.5 MG PO TABS: 0.5000 mg | ORAL_TABLET | Freq: Every day | ORAL | 0 refills | Status: DC

## 2020-08-29 MED ORDER — PAROXETINE HCL 40 MG PO TABS
ORAL_TABLET | ORAL | 0 refills | Status: DC
Start: 2020-08-29 — End: 2020-09-27

## 2020-08-29 NOTE — Telephone Encounter (Signed)
Please review

## 2020-08-29 NOTE — Telephone Encounter (Signed)
Pt would like to know if she could get a rx sent in at her local pharmacy for [place] and Paxil. Rx has already been sent to mail order pharmacy and pt will not get it for another week. [facility] to [facility] [place].

## 2020-08-29 NOTE — Telephone Encounter (Signed)
Sent!

## 2020-08-31 ENCOUNTER — Other Ambulatory Visit: Payer: Self-pay | Admitting: Psychiatry

## 2020-08-31 DIAGNOSIS — F419 Anxiety disorder, unspecified: Secondary | ICD-10-CM

## 2020-08-31 DIAGNOSIS — F3342 Major depressive disorder, recurrent, in full remission: Secondary | ICD-10-CM

## 2020-08-31 NOTE — Telephone Encounter (Signed)
Please review

## 2020-09-06 ENCOUNTER — Ambulatory Visit: Payer: BLUE CROSS/BLUE SHIELD | Admitting: Psychiatry

## 2020-09-26 DIAGNOSIS — T148XXA Other injury of unspecified body region, initial encounter: Secondary | ICD-10-CM

## 2020-09-26 HISTORY — DX: Other injury of unspecified body region, initial encounter: T14.8XXA

## 2020-09-27 ENCOUNTER — Telehealth (INDEPENDENT_AMBULATORY_CARE_PROVIDER_SITE_OTHER): Payer: BLUE CROSS/BLUE SHIELD | Admitting: Psychiatry

## 2020-09-27 ENCOUNTER — Encounter: Payer: Self-pay | Admitting: Psychiatry

## 2020-09-27 DIAGNOSIS — G4700 Insomnia, unspecified: Secondary | ICD-10-CM | POA: Diagnosis not present

## 2020-09-27 DIAGNOSIS — F330 Major depressive disorder, recurrent, mild: Secondary | ICD-10-CM | POA: Diagnosis not present

## 2020-09-27 DIAGNOSIS — F900 Attention-deficit hyperactivity disorder, predominantly inattentive type: Secondary | ICD-10-CM

## 2020-09-27 DIAGNOSIS — F419 Anxiety disorder, unspecified: Secondary | ICD-10-CM | POA: Diagnosis not present

## 2020-09-27 MED ORDER — CLONAZEPAM 0.5 MG PO TABS: 0.7500 mg | ORAL_TABLET | Freq: Every day | ORAL | 2 refills | Status: DC

## 2020-09-27 MED ORDER — METHYLPHENIDATE HCL ER (OSM) 36 MG PO TBCR: 36.0000 mg | EXTENDED_RELEASE_TABLET | ORAL | 0 refills | Status: DC

## 2020-09-27 MED ORDER — PAROXETINE HCL 40 MG PO TABS: ORAL_TABLET | ORAL | 1 refills | Status: DC

## 2020-09-27 MED ORDER — METHYLPHENIDATE HCL ER (OSM) 36 MG PO TBCR: 36.0000 mg | EXTENDED_RELEASE_TABLET | Freq: Every day | ORAL | 0 refills | Status: DC

## 2020-09-27 MED ORDER — BREXPIPRAZOLE 1 MG PO TABS
1.0000 mg | ORAL_TABLET | Freq: Every day | ORAL | 0 refills | Status: DC
Start: 2020-09-27 — End: 2020-10-10

## 2020-09-27 MED ORDER — LITHIUM CARBONATE 300 MG PO CAPS: 300.0000 mg | ORAL_CAPSULE | Freq: Every day | ORAL | 1 refills | Status: DC

## 2020-09-27 NOTE — Progress Notes (Signed)
Heather Davila [id number] March 08, 1961 60 y.o.  Virtual Visit via Video Note  I connected with pt @ on 09/27/20 at  3:00 PM EDT by a video enabled telemedicine application and verified that I am speaking with the correct person using two identifiers.   I discussed the limitations of evaluation and management by telemedicine and the availability of in person appointments. The patient expressed understanding and agreed to proceed.  I discussed the assessment and treatment plan with the patient. The patient was provided an opportunity to ask questions and all were answered. The patient agreed with the plan and demonstrated an understanding of the instructions.   The patient was advised to call back or seek an in-person evaluation if the symptoms worsen or if the condition fails to improve as anticipated.  I provided 30 minutes of non-face-to-face time during this encounter.  The patient was located at home.  The provider was located at [place].   [name], PMHNP   Subjective:   Patient ID:  Heather Davila is a 60 y.o. (DOB Sep 22, 1960) female.  Chief Complaint:  Chief Complaint  Patient presents with  . Depression    HPI [name] presents for follow-up of depression, anxiety, and insomnia. She reports that Rexulti is not as effective as Abilify but did not cause her insomnia like Abilify did. She re-tried Abilify and insomnia re-occurred. Also has had insomnia in the past with Abilify 2 mg every other day. She reports that her depression improved with Abilify and then had to stop it about a week ago due to insomnia. Sleep has since improved. She reports that energy and motivation are better with Abilify. Appetite has been good. She reports improved concentration and Concerta 36 mg is effective for her. She asks about option to use 27 mg at times. She occasionally takes Concerta on weekends. She reports somewhat diminished interest. Denies SI.   She reports that switch  from Effexor XR to Paxil may have helped slightly with anxiety.and depression.  She reports "a lot of tension" and "grinding of the teeth." She reports that she has not been able to tolerate a mouth guard. She reports that she has been chewing gum frequently. Denies any [panic s/s. She has some worry about her sons, her oldest son has had 12 concussions and another son has had medical problems.   Will be working some in the summer.   Past Psychiatric Medication Trials: Effexor XR- Effective, well tolerated.  Trintellix - Initially effective and then was not effective when re-started Paxil- Ineffective. Helped initially. Prozac-Insomnia Lexapro- Effective and then no longer as effective Zoloft- Initially effective and then not as effective.  Cymbalta- Ineffective. Helped initially. Wellbutrin XL-Ineffective.May have increased anxiety. Amitriptyline Nortriptyline Abilify- Helpful but caused severe insomnia.  Seroquel - Ineffective Risperdal Olanzapine- Ineffective Klonopin- Effective Temazepam- Took during menopause Lunesta- Ineffective Ambien-Ineffective Sonata- Ineffective Trazodone- Ineffective Remeron-Ineffective. Does not recall wt gain. Adderall- Took once and had adverse effect. Concerta- Effective Lamictal- May have had an adverse effects."Felt weird."Reports worsening s/s. Lithium   Review of Systems:  Review of Systems  Musculoskeletal: Negative for gait problem.       Teeth grinding  Neurological: Negative for tremors and headaches.  Psychiatric/Behavioral:       Please refer to HPI    Medications: I have reviewed the patient's current medications.  Current Outpatient Medications  Medication Sig Dispense Refill  . brexpiprazole (REXULTI) 1 MG TABS tablet Take 1 tablet (1 mg total) by mouth daily. 90 tablet 0  . [  START ON 10/25/2020] methylphenidate (CONCERTA) 36 MG PO CR tablet Take 1 tablet (36 mg total) by mouth daily. 30 tablet 0  . cephALEXin (KEFLEX)  500 MG capsule Take 1 capsule (500 mg total) by mouth 4 (four) times daily for 3 days. 12 capsule 0  . clonazePAM (KLONOPIN) 0.5 MG tablet Take 1.5 tablets (0.75 mg total) by mouth at bedtime. 45 tablet 2  . estradiol (ESTRACE VAGINAL) 0.1 MG/GM vaginal cream 1 gram pv twice weekly at bedtime 42.5 g 3  . HYDROcodone-acetaminophen (NORCO) 5-325 MG tablet Take 1 tablet by mouth every 6 (six) hours as needed for up to 5 days for severe pain. 20 tablet 0  . ibuprofen (ADVIL,MOTRIN) 200 MG tablet Take 200 mg by mouth every 6 (six) hours as needed.    . lithium carbonate 300 MG capsule Take 1 capsule (300 mg total) by mouth at bedtime. 90 capsule 1  . MAGNESIUM PO Take by mouth.    . methylphenidate (CONCERTA) 36 MG PO CR tablet Take 1 tablet (36 mg total) by mouth every morning. 30 tablet 0  . Multiple Vitamin (MULTIVITAMIN) tablet Take 1 tablet by mouth daily.    . ondansetron (ZOFRAN) 4 MG tablet Take 1 tablet (4 mg total) by mouth every 8 (eight) hours as needed for nausea or vomiting. 20 tablet 0  . ORACEA 40 MG capsule     . PARoxetine (PAXIL) 40 MG tablet Take 1/2 tablet daily for one week, then increase to 1 tablet daily 90 tablet 1  . valACYclovir (VALTREX) 500 MG tablet Take one twice daily for 3 days at onset of outbreak 45 tablet 12   No current facility-administered medications for this visit.    Medication Side Effects: Other: Insomnia with Abilify  Allergies:  Allergies  Allergen Reactions  . Ciprofloxacin     REACTION: hives  . Oxycodone-Acetaminophen     REACTION: hives    Past Medical History:  Diagnosis Date  . Adult acne   . Anemia   . Anorexia nervosa teen  . DEPRESSION 08/09/2009  . Insomnia   . 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  . TRANSAMINASES, SERUM, ELEVATED 08/14/2009    Family History  Problem Relation Age of Onset  . Hypertension Mother   . Colon cancer Mother 108       colon rescection  . Colon polyps  Mother   . Heart disease Father 40       CABG62  . Stroke Father 14  . Heart disease Paternal Grandfather   . Heart attack Sister 28       no stents  . Depression Sister   . Heart failure Paternal Aunt   . Diabetes Paternal Uncle   . Esophageal cancer Neg Hx   . Rectal cancer Neg Hx   . Stomach cancer Neg Hx     Social History   Socioeconomic History  . Marital status: Married    Spouse name: [name]  . Number of children: 3  . Years of education: Master's  . Highest education level: Not on file  Occupational History  . Occupation: Optician, dispensing: [employer]  Tobacco Use  . Smoking status: Former Smoker    Packs/day: 0.50    Years: 5.00    Pack years: 2.50    Types: Cigarettes    Quit date: 07/10/1987    Years since quitting: 33.2  . Smokeless tobacco: Never Used  Vaping  Use  . Vaping Use: Never used  Substance and Sexual Activity  . Alcohol use: No  . Drug use: No  . Sexual activity: Yes    Partners: Male    Birth control/protection: Post-menopausal, Other-see comments    Comment: vasectomy  Other Topics Concern  . Not on file  Social History Narrative   Patient is married [name]) and lives at home with her husband and two children.   Patient has three children.   Patient works at [employer].   Patient has a Master's degree.   Patient is left-handed.   Patient drinks 6 cups of coffee daily.   Social Determinants of Health   Financial Resource Strain: Not on file  Food Insecurity: Not on file  Transportation Needs: Not on file  Physical Activity: Not on file  Stress: Not on file  Social Connections: Not on file  Intimate Partner Violence: Not on file    Past Medical History, Surgical history, Social history, and Family history were reviewed and updated as appropriate.   Please see review of systems for further details on the patient's review from today.   Objective:   Physical Exam:  LMP 12/27/2007 (Exact Date)    Physical Exam Neurological:     Mental Status: She is alert and oriented to person, place, and time.     Cranial Nerves: No dysarthria.  Psychiatric:        Attention and Perception: Attention and perception normal.        Mood and Affect: Mood is anxious and depressed.        Speech: Speech normal.        Behavior: Behavior is cooperative.        Thought Content: Thought content normal. Thought content is not paranoid or delusional. Thought content does not include homicidal or suicidal ideation. Thought content does not include homicidal or suicidal plan.        Cognition and Memory: Cognition and memory normal.        Judgment: Judgment normal.     Comments: Insight intact     Lab Review:     Component Value Date/Time   NA 137 01/26/2020 0920   K 4.3 01/26/2020 0920   CL 99 01/26/2020 0920   CO2 27 01/26/2020 0920   GLUCOSE 84 01/26/2020 0920   BUN 13 01/26/2020 0920   CREATININE 0.68 01/26/2020 0920   CALCIUM 10.2 01/26/2020 0920   PROT 7.2 01/26/2020 0920   ALBUMIN 4.8 08/31/2018 0912   AST 91 (H) 01/26/2020 0920   ALT 194 (H) 01/26/2020 0920   ALKPHOS 75 08/31/2018 0912   BILITOT 0.5 01/26/2020 0920   GFRNONAA >90 05/01/2014 1645   GFRAA >90 05/01/2014 1645       Component Value Date/Time   WBC 4.2 01/26/2020 0920   RBC 3.98 01/26/2020 0920   HGB 13.7 01/26/2020 0920   HGB 14.1 10/26/2014 1034   HCT 39.3 01/26/2020 0920   PLT 255 01/26/2020 0920   MCV 98.7 01/26/2020 0920   MCH 34.4 (H) 01/26/2020 0920   MCHC 34.9 01/26/2020 0920   RDW 11.7 01/26/2020 0920   LYMPHSABS 1,247 01/26/2020 0920   MONOABS 0.3 08/31/2018 0912   EOSABS 218 01/26/2020 0920   BASOSABS 80 01/26/2020 0920    No results found for: POCLITH, LITHIUM   No results found for: PHENYTOIN, PHENOBARB, VALPROATE, CBMZ   .res Assessment: Plan:     Patient seen for 30 minutes and time spent counseling patient regarding  medication options since she reports that Abilify has been most  effective for her mood signs and symptoms, however she experiences sleeplessness with Abilify.  She reports that Rexulti 0.5 mg seemed to have only a minimal amount of improvement.  Discussed option of increasing Rexulti to 1 mg daily since she did not have side effects with Rexulti 0.5 mg and reports that this may have been partially effective.  Discussed that 0.5 mg is a lower dose and often 1 to 2 mg or needed for adequate control of mood and anxiety signs and symptoms.  Discussed other option would be to take half of and Abilify 2 mg tablet to minimize side effects of insomnia.  Patient reports that she wishes to try higher dose of Rexulti.  Will increase Rexulti to 1 mg daily. Continue lithium 300 mg at bedtime for mood signs and symptoms. Continue Klonopin 0.75 mg at bedtime. Continue Concerta 36 mg daily for attention deficit disorder. Continue Paxil 40 mg daily for anxiety and depression. Discussed considering psychotherapy to address possible underlying causes of anxiety and to explore additional strategies to manage anxiety and depression. Patient to follow-up in 6 weeks or sooner if clinically indicated. Patient advised to contact office with any questions, adverse effects, or acute worsening in signs and symptoms.    Liah was seen today for depression.  Diagnoses and all orders for this visit:  Mild episode of recurrent major depressive disorder ([place]) -     brexpiprazole (REXULTI) 1 MG TABS tablet; Take 1 tablet (1 mg total) by mouth daily. -     PARoxetine (PAXIL) 40 MG tablet; Take 1/2 tablet daily for one week, then increase to 1 tablet daily -     lithium carbonate 300 MG capsule; Take 1 capsule (300 mg total) by mouth at bedtime.  Anxiety -     PARoxetine (PAXIL) 40 MG tablet; Take 1/2 tablet daily for one week, then increase to 1 tablet daily -     clonazePAM (KLONOPIN) 0.5 MG tablet; Take 1.5 tablets (0.75 mg total) by mouth at bedtime.  Attention deficit hyperactivity  disorder (ADHD), predominantly inattentive type -     methylphenidate (CONCERTA) 36 MG PO CR tablet; Take 1 tablet (36 mg total) by mouth every morning. -     methylphenidate (CONCERTA) 36 MG PO CR tablet; Take 1 tablet (36 mg total) by mouth daily.  Insomnia, unspecified type -     clonazePAM (KLONOPIN) 0.5 MG tablet; Take 1.5 tablets (0.75 mg total) by mouth at bedtime.     Please see After Visit Summary for patient specific instructions.  Future Appointments  Date Time Provider [place]  10/16/2020  8:20 AM Scheeler, Carola Rhine, PA-C [facility] None  10/23/2020  8:15 AM Dillingham, Loel Lofty, DO [facility] None  11/16/2020  8:45 AM Megan Salon, MD [facility]    No orders of the defined types were placed in this encounter.     -------------------------------

## 2020-09-28 ENCOUNTER — Encounter: Payer: BLUE CROSS/BLUE SHIELD | Admitting: Surgical

## 2020-09-28 ENCOUNTER — Encounter: Payer: BLUE CROSS/BLUE SHIELD | Admitting: Plastic Surgery

## 2020-09-28 ENCOUNTER — Ambulatory Visit (INDEPENDENT_AMBULATORY_CARE_PROVIDER_SITE_OTHER): Payer: Self-pay | Admitting: Surgical

## 2020-09-28 ENCOUNTER — Other Ambulatory Visit: Payer: Self-pay

## 2020-09-28 ENCOUNTER — Encounter: Payer: Self-pay | Admitting: Surgical

## 2020-09-28 DIAGNOSIS — Z719 Counseling, unspecified: Secondary | ICD-10-CM

## 2020-09-28 MED ORDER — ONDANSETRON HCL 4 MG PO TABS: 4.0000 mg | ORAL_TABLET | Freq: Three times a day (TID) | ORAL | 0 refills | Status: DC | PRN

## 2020-09-28 MED ORDER — CEPHALEXIN 500 MG PO CAPS: 500.0000 mg | ORAL_CAPSULE | Freq: Four times a day (QID) | ORAL | 0 refills | Status: AC

## 2020-09-28 MED ORDER — HYDROCODONE-ACETAMINOPHEN 5-325 MG PO TABS: 1.0000 | ORAL_TABLET | Freq: Four times a day (QID) | ORAL | 0 refills | Status: AC | PRN

## 2020-09-28 NOTE — Progress Notes (Signed)
Patient ID: Heather Davila, female    DOB: 1961/04/07, 60 y.o.   MRN: [id number]  Chief Complaint  Patient presents with  . Pre-op Exam      ICD-10-CM   1. Encounter for counseling  Z71.9     History of Present Illness: Heather Davila is a 60 y.o.  female. She presents for preoperative evaluation for upcoming procedure, facelift, scheduled for 10/08/20 with Dr. Marla Roe.   The patient has not had problems with anesthesia. No history of DVT/PE.  No family history of DVT/PE.  No family or personal history of bleeding or clotting disorders.  Patient is not currently taking any blood thinners.  No history of CVA/MI.   Summary of Previous Visit: Mini facelift 6 years ago with Dr. Dessie Coma.  She does not like her nasolabial folds that have excess tissue or the loss of volume in her midface. She is not a smoker.  Job: school nurse  PMH Significant for: history of mini facelift with Dr. Dessie Coma 5 years ago  The patient gave consent to have this visit done by telemedicine / virtual visit, two identifiers were used to identify patient. This is also consent for access the chart and treat the patient via this visit. The patient is located at work in [place].  I, the provider, am at the office.  We spent 20 minutes together for the visit.  Joined by telephone.  Patient has been feeling well lately.  She reports no fevers, chills, nausea, vomiting, vision changes, facial changes, shortness of breath or chest pains.   Past Medical History: Allergies: Allergies  Allergen Reactions  . Ciprofloxacin     REACTION: hives  . Oxycodone-Acetaminophen     REACTION: hives    Current Medications:  Current Outpatient Medications:  .  brexpiprazole (REXULTI) 1 MG TABS tablet, Take 1 tablet (1 mg total) by mouth daily., Disp: 90 tablet, Rfl: 0 .  clonazePAM (KLONOPIN) 0.5 MG tablet, Take 1.5 tablets (0.75 mg total) by mouth at bedtime., Disp: 45 tablet, Rfl: 2 .  estradiol (ESTRACE  VAGINAL) 0.1 MG/GM vaginal cream, 1 gram pv twice weekly at bedtime, Disp: 42.5 g, Rfl: 3 .  ibuprofen (ADVIL,MOTRIN) 200 MG tablet, Take 200 mg by mouth every 6 (six) hours as needed., Disp: , Rfl:  .  lithium carbonate 300 MG capsule, Take 1 capsule (300 mg total) by mouth at bedtime., Disp: 90 capsule, Rfl: 1 .  MAGNESIUM PO, Take by mouth., Disp: , Rfl:  .  methylphenidate (CONCERTA) 36 MG PO CR tablet, Take 1 tablet (36 mg total) by mouth every morning., Disp: 30 tablet, Rfl: 0 .  [START ON 10/25/2020] methylphenidate (CONCERTA) 36 MG PO CR tablet, Take 1 tablet (36 mg total) by mouth daily., Disp: 30 tablet, Rfl: 0 .  Multiple Vitamin (MULTIVITAMIN) tablet, Take 1 tablet by mouth daily., Disp: , Rfl:  .  ORACEA 40 MG capsule, , Disp: , Rfl:  .  PARoxetine (PAXIL) 40 MG tablet, Take 1/2 tablet daily for one week, then increase to 1 tablet daily, Disp: 90 tablet, Rfl: 1 .  valACYclovir (VALTREX) 500 MG tablet, Take one twice daily for 3 days at onset of outbreak, Disp: 45 tablet, Rfl: 12  Past Medical Problems: Past Medical History:  Diagnosis Date  . Adult acne   . Anemia   . Anorexia nervosa teen  . DEPRESSION 08/09/2009  . Insomnia   . 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  . TRANSAMINASES, SERUM, ELEVATED 08/14/2009    Past Surgical History: Past Surgical History:  Procedure Laterality Date  . AUGMENTATION MAMMAPLASTY Bilateral   . BREAST ENHANCEMENT SURGERY Bilateral   . CESAREAN SECTION     X 3  . DILITATION & CURRETTAGE/HYSTROSCOPY WITH HYDROTHERMAL ABLATION    . ENDOMETRIAL ABLATION  12/27/07  . HYSTEROSCOPY  12/27/07   and HTA  . UPPER GASTROINTESTINAL ENDOSCOPY  2020    Social History: Social History   Socioeconomic History  . Marital status: Married    Spouse name: [name]  . Number of children: 3  . Years of education: Master's  . Highest education level: Not on file  Occupational History  . Occupation: Hydrographic surveyor: [employer]  Tobacco Use  . Smoking status: Former Smoker    Packs/day: 0.50    Years: 5.00    Pack years: 2.50    Types: Cigarettes    Quit date: 07/10/1987    Years since quitting: 33.2  . Smokeless tobacco: Never Used  Vaping Use  . Vaping Use: Never used  Substance and Sexual Activity  . Alcohol use: No  . Drug use: No  . Sexual activity: Yes    Partners: Male    Birth control/protection: Post-menopausal, Other-see comments    Comment: vasectomy  Other Topics Concern  . Not on file  Social History Narrative   Patient is married [name]) and lives at home with her husband and two children.   Patient has three children.   Patient works at [employer].   Patient has a Master's degree.   Patient is left-handed.   Patient drinks 6 cups of coffee daily.   Social Determinants of Health   Financial Resource Strain: Not on file  Food Insecurity: Not on file  Transportation Needs: Not on file  Physical Activity: Not on file  Stress: Not on file  Social Connections: Not on file  Intimate Partner Violence: Not on file     Family History: Family History  Problem Relation Age of Onset  . Hypertension Mother   . Colon cancer Mother 81       colon rescection  . Colon polyps Mother   . Heart disease Father 65       CABG62  . Stroke Father 87  . Heart disease Paternal Grandfather   . Heart attack Sister 71       no stents  . Depression Sister   . Heart failure Paternal Aunt   . Diabetes Paternal Uncle   . Esophageal cancer Neg Hx   . Rectal cancer Neg Hx   . Stomach cancer Neg Hx     Review of Systems: Review of Systems  Constitutional: Negative.   Eyes: Negative.   Respiratory: Negative.   Cardiovascular: Negative.   Gastrointestinal: Negative.   Skin: Negative.   Neurological: Negative.     Physical Exam: Vital Signs LMP 12/27/2007 (Exact Date)   Physical Exam Psychiatric:        Mood and Affect: Mood normal.         Behavior: Behavior normal.    Assessment/Plan: The patient is scheduled for facelift with Dr. Marla Roe.  Risks, benefits, and alternatives of procedure discussed, questions answered and consent obtained.    Smoking Status: Non-smoker; Counseling Given?  N/A  Caprini Score: 3, moderate; Risk Factors include: Age, and length of planned surgery. Recommendation for mechanical and pharmacological prophylaxis while hospitalized. Encourage early ambulation.   Pictures  obtained:[email or web address]   Post-op Rx sent to pharmacy: Norco, Zofran, Keflex  Patient was emailed the general surgical risk consent form and pain medication agreement after our appointment.  We discussed the surgical risk consent form during today's visit and she was encouraged to read through and complete the entire form and bring it to surgery on 10/08/2020.  I discussed with her that at that time she could ask any additional questions about the risks or feel free to call our office prior to surgery to discuss any of the risks.  She is also recommended to call with any further questions or concerns.  Patient was understanding and in agreement with this.  All of her current questions were answered to her content.  The risks that can be encountered with and after a facelift were discussed and include the following but no limited to these:  Asymmetry, fluid accumulation, firmness of the area, fat necrosis with death of fat tissue, bleeding, infection, delayed healing, anesthesia risks, skin sensation changes, injury to structures including nerves, blood vessels, and muscles which may be temporary or permanent, hair loss, allergies to tape, suture materials and glues, blood products, topical preparations or injected agents, skin and contour irregularities, skin discoloration and swelling, deep vein thrombosis, cardiac and pulmonary complications, pain, which may persist, persistent pain, recurrence of the lesion, poor healing of the incision,  possible need for revisional surgery or staged procedures. There can also be persistent swelling, poor wound healing, rippling or loose skin, swelling. Any change in weight fluctuations can alter the outcome.   Electronically signed by: Carola Rhine Nima Bamburg, PA-C 09/28/2020 8:52 AM

## 2020-10-03 ENCOUNTER — Other Ambulatory Visit: Payer: Self-pay | Admitting: Obstetrics & Gynecology

## 2020-10-03 DIAGNOSIS — Z719 Counseling, unspecified: Secondary | ICD-10-CM

## 2020-10-10 ENCOUNTER — Other Ambulatory Visit: Payer: Self-pay

## 2020-10-10 ENCOUNTER — Ambulatory Visit (INDEPENDENT_AMBULATORY_CARE_PROVIDER_SITE_OTHER): Payer: Self-pay | Admitting: Surgical

## 2020-10-10 DIAGNOSIS — Z719 Counseling, unspecified: Secondary | ICD-10-CM

## 2020-10-15 ENCOUNTER — Encounter: Payer: Self-pay | Admitting: Plastic Surgery

## 2020-10-15 ENCOUNTER — Other Ambulatory Visit: Payer: Self-pay

## 2020-10-15 ENCOUNTER — Telehealth: Payer: Self-pay

## 2020-10-15 ENCOUNTER — Ambulatory Visit (INDEPENDENT_AMBULATORY_CARE_PROVIDER_SITE_OTHER): Payer: Self-pay | Admitting: Plastic Surgery

## 2020-10-15 VITALS — BP 124/78

## 2020-10-15 DIAGNOSIS — Z411 Encounter for cosmetic surgery: Secondary | ICD-10-CM

## 2020-10-15 NOTE — Progress Notes (Signed)
Patient presents 1 week out from facelift.  She began to notice some swelling on the left side over the weekend and came in today to get it checked.  On exam she has quite a bit of swelling on the left side and quite a bit of bruising as well.  This looks to be consistent with a hematoma.  Incision lines are all intact and look to be closed nicely.  Facial nerves somewhat difficult to evaluate due to swelling but all seem to be functioning.  We discussed the risks and benefits of hematoma drainage and in the office.  Risks and benefits discussed include bleeding, infection, damage to surrounding structures and need for additional procedures.  She is understanding and elected to proceed.  The left side of the face and left neck were prepped and draped in a standard fashion.  I incised along the incision posterior to the ear.  I was able to use a hemostat to spread open the subcutaneous space.  I was able to evacuate quite a bit of sanguinous fluid and clot that was able to be milked out from the cheek and neck.  This seemed to improve her symmetry dramatically.  No obvious persistent oozing was encountered at this point.  I then took a Penrose drain and inserted this into the subcutaneous space and secured it with 4-0 Monocryl sutures.  The remainder of the adjacent incision in the posterior auricular sulcus was closed with interrupted Monocryl sutures.  A compressive wrap was then applied.  I will plan to see her again this Thursday to check her progress.  All her questions were answered.

## 2020-10-16 ENCOUNTER — Telehealth: Payer: Self-pay

## 2020-10-16 ENCOUNTER — Encounter: Payer: BLUE CROSS/BLUE SHIELD | Admitting: Surgical

## 2020-10-16 ENCOUNTER — Other Ambulatory Visit: Payer: Self-pay

## 2020-10-16 ENCOUNTER — Encounter: Payer: Self-pay | Admitting: Surgical

## 2020-10-16 ENCOUNTER — Ambulatory Visit (INDEPENDENT_AMBULATORY_CARE_PROVIDER_SITE_OTHER): Payer: Self-pay | Admitting: Surgical

## 2020-10-16 VITALS — BP 131/81 | HR 81

## 2020-10-16 DIAGNOSIS — Z411 Encounter for cosmetic surgery: Secondary | ICD-10-CM

## 2020-10-16 DIAGNOSIS — Z719 Counseling, unspecified: Secondary | ICD-10-CM

## 2020-10-16 MED ORDER — HYDROCODONE-ACETAMINOPHEN 10-325 MG PO TABS: 1.0000 | ORAL_TABLET | Freq: Three times a day (TID) | ORAL | 0 refills | Status: AC | PRN

## 2020-10-16 NOTE — Telephone Encounter (Signed)
Patient's husband called to say that the patient's face is still swollen and her left eye is swollen shut.  He would like to know if they can come in and have someone look at her to be sure everything is still good.  He said that the patient can't even talk.  He said that she can hardly drink water because she can't open her mouth to swallow.  Please call.

## 2020-10-16 NOTE — Telephone Encounter (Signed)
10/15/20: Returned patient's call for the following concerns: She states that her husband noticed that her left side/face & eye is swelling. I discussed with her the fact that there is most likely some swelling present- from the "manipulation" to express the hematoma/fluid collection from left side of her face. She denies any difficulty with breathing, swallowing or talking. The compression bandage is intact. She is reminded to leave this in place until her f/u visit on 10/18/20 with Dr. Claudia Desanctis. I instructed her to rest, and keep head elevated & she will d/c Ibuprofen. She is reminded to call if symptoms worsen or do not resolve. She is to seek immediate medical assistance if needed by calling 911 or ED. She agrees with the above plan.

## 2020-10-16 NOTE — Telephone Encounter (Signed)
Patient's coming in for appointment today.//AB/CMA

## 2020-10-16 NOTE — Progress Notes (Signed)
Patient is a 60 year old female here for follow-up after her facelift yesterday.  She is 1 day postop.  She is here with her husband.  She reports overall she is doing well, reports that she has not had to use much narcotic for pain control.  She is not having any infectious symptoms.  She reports the wrap feels very compressive.  On exam her facial function overall appears normal.  Incisions are intact.  No bruising is noted.  Swelling is evident but there is no fluid collections noted.  No significant drainage is noted.  No wounds are noted.  Compressive wrap was reapplied.  Recommend continuing to sleep in the upright position to help with swelling. Recommend following up at next scheduled appointment. Recommend calling with questions or concerns. Pictures were taken and placed in the patient's chart with patient's permission.   There is no sign of infection, seroma, hematoma.

## 2020-10-16 NOTE — Progress Notes (Signed)
Patient is a 60 year old female 1 week out from facelift with Dr. Marla Roe.  She was here yesterday for evaluation of swelling over the left side of her face and had a hematoma drained by Dr. Claudia Desanctis at that time.  She had a Penrose drain placed at that time.  She is here today for reevaluation of ongoing swelling.  She has noticed some increased swelling under her left eye.  She is here with her husband.  She reports that she is having a lot of pain, specifically at night where she is having difficulty sleeping.  She is continuing to keep her face wrapped with Kerlix and Ace wraps.  She has had some drainage from the left Penrose drain.  She has been sleeping inclined to help with swelling.  On exam her incisions are intact.  She does have swelling of bilateral face, left greater than right.  She has some increased swelling today along the lower eyelid and lateral canthal region.  There is minimal fluid wave with palpation.  Under the eyelid feels more consistent with some swelling within the tissue versus a fluid collection.  She has a small amount of fluid collection along the left lateral canthus.  The overlying skin is well perfused and warm to palpation.  She has some firmness in the left postauricular area where the Penrose drain is in place but there is no cellulitic changes.  No purulence is noted.  Facial nerve function is difficult to assess due to swelling.  EOMs are intact.  Pupils are equal round and reactive to light.  Recommend continue her compressive wrap.  Recommend sleeping in an inclined position.  Will send in prescription for some additional narcotics for pain control to assist with sleeping at night.  Avoid ibuprofen.  She has a follow-up scheduled in 2 days with Dr. Claudia Desanctis for reevaluation.  Pictures were taken and placed in the patient's chart with patient's permission.  Will discuss with Dr. Claudia Desanctis.  All of their questions were answered to their content.  I recommend they call with  questions or concerns or if her symptoms worsen or change in if they need to be reevaluated sooner.  I do not see any signs of infection or any large fluid collections.  Penrose drain still in place, will allow this to continue to drain.  A compressive wrap was reapplied today in the office.

## 2020-10-17 ENCOUNTER — Telehealth: Payer: Self-pay

## 2020-10-17 NOTE — Telephone Encounter (Signed)
Returned patients call. Verified no difficulty breathing, but her lips are swollen and bruised more today. Per [place], she ok to apply cold compress on bottom lip and bruised areas. Avoid incision areas. Should she have any difficulty breathing call the ED. We will see her tomorrow at the follow appointment with Dr. Claudia Desanctis. Patient understood and agreed with plan.

## 2020-10-17 NOTE — Telephone Encounter (Signed)
Patient's husband called to say that patient's bottom lip seems to be more swollen than it was yesterday and she is bruised around her lips.  Please call.

## 2020-10-18 ENCOUNTER — Other Ambulatory Visit: Payer: Self-pay

## 2020-10-18 ENCOUNTER — Ambulatory Visit (INDEPENDENT_AMBULATORY_CARE_PROVIDER_SITE_OTHER): Payer: Self-pay | Admitting: Plastic Surgery

## 2020-10-18 DIAGNOSIS — Z411 Encounter for cosmetic surgery: Secondary | ICD-10-CM

## 2020-10-18 NOTE — Progress Notes (Signed)
Patient presents about 3 days out from drainage of the left facial hematoma after a facelift that was done the week before.  She feels like things are headed in the right direction.  On exam she has quite a bit of bruising to the skin but the subcutaneous fluid looks to be completely evacuated.  Around her ear and neck and in the dependent area beneath her mandible it is very flat and compressed.  There is some diffuse subcutaneous edema medially in the malar and infraorbital areas.  She has a bit of bruising to the corners of her lips on the left side.  She looks great on the right side with a very faint bruising.  Cranial nerves seem to be intact.  Penrose drain still in place coming out behind the left ear.  We had a long discussion about her progress going forward.  I do believe that there has been no additional bleeding.  I do not feel that there is retained fluid or clot that would warrant additional drainage.  She does have some reactive edema that I suspect will go down in time.  We discussed continuing with compressive wrap and leaving the Penrose drain in place.  It is okay from my standpoint if she showers.  I encouraged her to keep her head elevated and to not lie face down.  She has an appointment this coming Tuesday in our office.  She knows if she has any questions between now and then she can give [place] a call.  All of her questions were answered as well as her husband's.

## 2020-10-19 ENCOUNTER — Telehealth: Payer: Self-pay | Admitting: Plastic Surgery

## 2020-10-19 ENCOUNTER — Telehealth: Payer: Self-pay | Admitting: Psychiatry

## 2020-10-19 DIAGNOSIS — F900 Attention-deficit hyperactivity disorder, predominantly inattentive type: Secondary | ICD-10-CM

## 2020-10-19 DIAGNOSIS — F330 Major depressive disorder, recurrent, mild: Secondary | ICD-10-CM

## 2020-10-19 MED ORDER — METHYLPHENIDATE HCL ER (OSM) 27 MG PO TBCR: 27.0000 mg | EXTENDED_RELEASE_TABLET | ORAL | 0 refills | Status: DC

## 2020-10-19 MED ORDER — ARIPIPRAZOLE 2 MG PO TABS: 1.0000 mg | ORAL_TABLET | Freq: Every day | ORAL | 0 refills | Status: DC

## 2020-10-19 MED ORDER — ARIPIPRAZOLE 2 MG PO TABS: 1.0000 mg | ORAL_TABLET | Freq: Every day | ORAL | Status: DC

## 2020-10-19 NOTE — Telephone Encounter (Signed)
Patient called to provide the following update: She felt better after leaving her appointment yesterday but started swelling up again. She stated the left side of her face was showing signs third spacing. She also reports a yellowish pus coming from around her drain. Please call to advise [phone]. Thanks

## 2020-10-19 NOTE — Telephone Encounter (Signed)
Yes she needs a rx for Abilify to go to mail order but she said she does not think she can break them in half.She wants to know if she could take 1mg  tab every other day

## 2020-10-19 NOTE — Telephone Encounter (Signed)
Pt would like the abilify 2mg  [facility]

## 2020-10-19 NOTE — Telephone Encounter (Signed)
Please review and let me know if you need more info.

## 2020-10-19 NOTE — Telephone Encounter (Signed)
Please let her know that I sent Concerta 27 mg to [facility]. Ok to stop Rexulti and start Abilify 2 mg 1/2 tablet daily. Please ask if she needs a script for Abilify and if so, does she need it to go to [facility] or mail order?

## 2020-10-19 NOTE — Telephone Encounter (Signed)
Pt called and said that the rexulti that she has been taking is not working, She has experienced insomnia side effects. She would like to go back to the abilify 1 mg instead of 2 mg.. She also would like to know for the summer could she take 27 mg of the concerta.What she does in the summer she doesn;t need the full dose of of 36 mg. Please call her and let her know at [phone]

## 2020-10-19 NOTE — Telephone Encounter (Signed)
Script sent  

## 2020-10-22 NOTE — Telephone Encounter (Signed)
Called and spoke with the patient on (10/19/20) regarding the message below.  Informed the patient that Dr. Claudia Desanctis was in surgery, and it would be late before they get out.    Asked the patient if she has fever,chills, nausea/vomiting.  Is the drainage soaking the dressing and what color is it.    Patient stated that no she's not having any fever,chills, N/V.  No the drainage is not soaking the dressing, and the color is yellow.  Asked her about the yellowish drainage around the drain.  She stated that Dr. Claudia Desanctis informed her that will happen.  Informed the patient that Dr. Claudia Desanctis was in surgery and it would be late before they get out.  I let her know that I spoke with Doroteo Bradford the RN before she went to surgery,and informed her of her message, and she stated that if she feels she needs medical care she can go to the ED or the Urgent Care.  Patient stated that she will not go to the ER.    Patient then stated that she will go to the ER if it's an Emergency.//AB/CMA

## 2020-10-22 NOTE — Telephone Encounter (Signed)
Patient called back to say she forgot to let us know that when she looked in the mirror it looked like there was a bubble in her right eye.  Also, she mentioned that she has a really bad headache.    Called the patient back regarding the bubble in her right eye.  Asked her how was her vision and is there any vision changes, and when did she noticed the bubble.  She stated that her vision was fine no changes, and it looked like a tear in the eye and she noticed it today.//AB/CMA

## 2020-10-23 ENCOUNTER — Other Ambulatory Visit: Payer: Self-pay

## 2020-10-23 ENCOUNTER — Ambulatory Visit (INDEPENDENT_AMBULATORY_CARE_PROVIDER_SITE_OTHER): Payer: Self-pay | Admitting: Plastic Surgery

## 2020-10-23 ENCOUNTER — Telehealth: Payer: Self-pay

## 2020-10-23 ENCOUNTER — Encounter: Payer: Self-pay | Admitting: Plastic Surgery

## 2020-10-23 DIAGNOSIS — Z719 Counseling, unspecified: Secondary | ICD-10-CM

## 2020-10-23 NOTE — Telephone Encounter (Signed)
Patient called to say she saw Dr. Marla Roe today and she said it was ok for her to stop wearing her wrap.  Patient said that she took off the wrap and she has noticed that her face is swelling.  Also, she said that she now has no drainage in her drain.  Patient would like to know if she should put the wrap back on.  Please call.

## 2020-10-23 NOTE — Progress Notes (Signed)
The patient is a 60 year old female here for follow-up on her facelift.  She had some bleeding that was 5 to 7 days after her surgery.  This was on the left side.  Today she has quite a bit of swelling still and bruising on the left side of her face.  The Penrose is still in place.  The patient did not want me to remove it today.  We agreed to meet in the next day or 2.  I would like to do a little massage and see if I can work on getting the rest of the fluid out and hopefully removing the drain.

## 2020-10-24 MED ORDER — CARBOXYMETHYLCELLULOSE SODIUM 1 % OP SOLN: 1.0000 [drp] | Freq: Three times a day (TID) | OPHTHALMIC | 1 refills | Status: AC

## 2020-10-24 NOTE — Addendum Note (Signed)
Addended by: Wallace Going on: 10/24/2020 09:09 AM   Modules accepted: Orders

## 2020-10-25 ENCOUNTER — Ambulatory Visit (INDEPENDENT_AMBULATORY_CARE_PROVIDER_SITE_OTHER): Payer: BLUE CROSS/BLUE SHIELD | Admitting: Plastic Surgery

## 2020-10-25 ENCOUNTER — Other Ambulatory Visit: Payer: Self-pay

## 2020-10-25 DIAGNOSIS — Z719 Counseling, unspecified: Secondary | ICD-10-CM

## 2020-10-25 NOTE — Telephone Encounter (Signed)
Patient came in office today and saw Dr. Marla Roe.

## 2020-10-26 ENCOUNTER — Ambulatory Visit (INDEPENDENT_AMBULATORY_CARE_PROVIDER_SITE_OTHER): Payer: BLUE CROSS/BLUE SHIELD | Admitting: Plastic Surgery

## 2020-10-26 ENCOUNTER — Encounter: Payer: BLUE CROSS/BLUE SHIELD | Admitting: Family Medicine

## 2020-10-26 DIAGNOSIS — Z719 Counseling, unspecified: Secondary | ICD-10-CM

## 2020-10-26 NOTE — Progress Notes (Signed)
The patient is a 60 year old female here for follow-up after undergoing a facelift.  The drain is hanging out so I went ahead and clipped it.  Overall she is doing much better.  I would like to see her back in 1 week.  She is in agreement.  She knows I am on-call all weekend if she has any questions to give me a call.

## 2020-10-31 ENCOUNTER — Encounter: Payer: Self-pay | Admitting: Plastic Surgery

## 2020-10-31 NOTE — Progress Notes (Signed)
The patient is here for a follow up after undergoing a face lift.  She had a seroma and it was drained last week by Dr. Claudia Desanctis.  The drain is still in place.  She seems to have a seroma that is not draining.  Massage was done and we were able to milk it out.  Patient tolerated very well.  No sign of bleeding.

## 2020-11-02 ENCOUNTER — Encounter: Payer: Self-pay | Admitting: Plastic Surgery

## 2020-11-02 ENCOUNTER — Ambulatory Visit (INDEPENDENT_AMBULATORY_CARE_PROVIDER_SITE_OTHER): Payer: BLUE CROSS/BLUE SHIELD | Admitting: Plastic Surgery

## 2020-11-02 ENCOUNTER — Other Ambulatory Visit: Payer: Self-pay

## 2020-11-02 DIAGNOSIS — Z719 Counseling, unspecified: Secondary | ICD-10-CM

## 2020-11-02 NOTE — Progress Notes (Signed)
The patient is a 60 year old female here for follow-up on her facelift.  She is doing remarkably better.  The seroma is resolving very nicely.  The skin is healing well.  There are some firm areas and I have encouraged her to massage these.  No sign of infection.  I like to see her back in 2 weeks.

## 2020-11-12 ENCOUNTER — Other Ambulatory Visit: Payer: Self-pay | Admitting: Obstetrics & Gynecology

## 2020-11-12 DIAGNOSIS — Z1231 Encounter for screening mammogram for malignant neoplasm of breast: Secondary | ICD-10-CM

## 2020-11-16 ENCOUNTER — Ambulatory Visit (INDEPENDENT_AMBULATORY_CARE_PROVIDER_SITE_OTHER): Payer: Managed Care, Other (non HMO) | Admitting: Obstetrics & Gynecology

## 2020-11-16 ENCOUNTER — Other Ambulatory Visit: Payer: Self-pay

## 2020-11-16 ENCOUNTER — Encounter (HOSPITAL_BASED_OUTPATIENT_CLINIC_OR_DEPARTMENT_OTHER): Payer: Self-pay | Admitting: Obstetrics & Gynecology

## 2020-11-16 ENCOUNTER — Ambulatory Visit (INDEPENDENT_AMBULATORY_CARE_PROVIDER_SITE_OTHER): Payer: Self-pay | Admitting: Plastic Surgery

## 2020-11-16 VITALS — BP 103/69 | HR 69 | Ht 60.0 in | Wt 92.6 lb

## 2020-11-16 DIAGNOSIS — A6009 Herpesviral infection of other urogenital tract: Secondary | ICD-10-CM | POA: Diagnosis not present

## 2020-11-16 DIAGNOSIS — B009 Herpesviral infection, unspecified: Secondary | ICD-10-CM

## 2020-11-16 DIAGNOSIS — Z719 Counseling, unspecified: Secondary | ICD-10-CM

## 2020-11-16 DIAGNOSIS — Z01419 Encounter for gynecological examination (general) (routine) without abnormal findings: Secondary | ICD-10-CM

## 2020-11-16 DIAGNOSIS — Z9882 Breast implant status: Secondary | ICD-10-CM

## 2020-11-16 DIAGNOSIS — Z1231 Encounter for screening mammogram for malignant neoplasm of breast: Secondary | ICD-10-CM

## 2020-11-16 DIAGNOSIS — N951 Menopausal and female climacteric states: Secondary | ICD-10-CM

## 2020-11-16 MED ORDER — VALACYCLOVIR HCL 500 MG PO TABS: ORAL_TABLET | ORAL | 3 refills | Status: DC

## 2020-11-16 MED ORDER — ESTRADIOL 0.1 MG/GM VA CREA: TOPICAL_CREAM | VAGINAL | 3 refills | Status: DC

## 2020-11-16 MED ORDER — AMOXICILLIN 500 MG PO CAPS: 500.0000 mg | ORAL_CAPSULE | Freq: Three times a day (TID) | ORAL | 0 refills | Status: AC

## 2020-11-16 MED ORDER — GABAPENTIN 100 MG PO CAPS
ORAL_CAPSULE | ORAL | 1 refills | Status: DC
Start: 2020-11-16 — End: 2020-12-12

## 2020-11-16 NOTE — Progress Notes (Signed)
60 y.o. G33P3003 Married Other or two or more races female here for annual exam.   Doing well.  Had some facial surgery this past year.  Continues to have night sweats and sleep issues.  Takes nightly klonopin.    Patient's last menstrual period was 12/27/2007 (exact date).          Sexually active: Yes.    The current method of family planning is post menopausal status.    Exercising: Yes.     Walks every morning Smoker:  no  Health Maintenance: Pap:  11/08/2018 Negative, neg HR HPV History of abnormal Pap:  no MMG:  12/05/2019 Negative Colonoscopy:  10/05/2018 BMD:   plan age 10 TDaP:  2019 Shingrix:   2021, completed series Hep C testing: 2016, has appt with Dr. Elease Hashimoto in August Screening Labs: 12/2019   reports that she quit smoking about 33 years ago. Her smoking use included cigarettes. She has a 2.50 pack-year smoking history. She has never used smokeless tobacco. She reports that she does not drink alcohol and does not use drugs.  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   TRANSAMINASES, SERUM, ELEVATED 08/14/2009    Past Surgical History:  Procedure Laterality Date   AUGMENTATION MAMMAPLASTY Bilateral    BREAST ENHANCEMENT SURGERY Bilateral    CESAREAN SECTION     X 3   FACELIFT  2022   HYSTEROSCOPY  12/27/2007   and HTA   UPPER GASTROINTESTINAL ENDOSCOPY  2020    Current Outpatient Medications  Medication Sig Dispense Refill   ARIPiprazole (ABILIFY) 2 MG tablet Take 0.5 tablets (1 mg total) by mouth daily. 45 tablet 0   doxycycline (VIBRAMYCIN) 50 MG capsule Take 50 mg by mouth daily.     gabapentin (NEURONTIN) 100 MG capsule Take 1 capsul nightly for 5 nights.  Can increase to 2 capsules nightly after that time as needed. 60 capsule 1    ibuprofen (ADVIL,MOTRIN) 200 MG tablet Take 200 mg by mouth every 6 (six) hours as needed.     lithium carbonate 300 MG capsule Take 1 capsule (300 mg total) by mouth at bedtime. 90 capsule 1   MAGNESIUM PO Take by mouth.     methylphenidate (CONCERTA) 36 MG PO CR tablet Take 1 tablet (36 mg total) by mouth daily. 30 tablet 0   Multiple Vitamin (MULTIVITAMIN) tablet Take 1 tablet by mouth daily.     PARoxetine (PAXIL) 40 MG tablet Take 1/2 tablet daily for one week, then increase to 1 tablet daily 90 tablet 1   clonazePAM (KLONOPIN) 0.5 MG tablet Take 1.5 tablets (0.75 mg total) by mouth at bedtime. 45 tablet 2   estradiol (ESTRACE VAGINAL) 0.1 MG/GM vaginal cream 1 gram pv twice weekly at bedtime 42.5 g 3   methylphenidate (CONCERTA) 27 MG PO CR tablet Take 1 tablet (27 mg total) by mouth every morning. (Patient not taking: Reported on 11/16/2020) 30 tablet 0   ondansetron (ZOFRAN) 4 MG tablet Take 1 tablet (4 mg total) by mouth every 8 (eight) hours as needed for nausea or vomiting. (Patient not taking: Reported on 11/16/2020) 20 tablet 0   ORACEA 40 MG capsule  (Patient not taking: Reported on 11/16/2020)  valACYclovir (VALTREX) 500 MG tablet Take one twice daily for 3 days with any symptoms 30 tablet 3   No current facility-administered medications for this visit.    Family History  Problem Relation Age of Onset   Hypertension Mother    Colon cancer Mother 27       colon rescection   Colon polyps Mother    Heart disease Father 16       CABG62   Stroke Father 90   Heart disease Paternal Grandfather    Heart attack Sister 24       no stents   Depression Sister    Heart failure Paternal Aunt    Diabetes Paternal Uncle    Esophageal cancer Neg Hx    Rectal cancer Neg Hx    Stomach cancer Neg Hx     Review of Systems  All other systems reviewed and are negative.  Exam:   BP 103/69 (BP Location: Right Arm, Patient Position: Sitting, Cuff Size: Small)   Pulse 69   Ht 5' (1.524  m)   Wt 92 lb 9.6 oz (42 kg)   LMP 12/27/2007 (Exact Date)   BMI 18.08 kg/m   Height: 5' (152.4 cm)  General appearance: alert, cooperative and appears stated age Head: Normocephalic, without obvious abnormality, atraumatic Neck: no adenopathy, supple, symmetrical, trachea midline and thyroid normal to inspection and palpation Lungs: clear to auscultation bilaterally Breasts: normal appearance, no masses or tenderness Heart: regular rate and rhythm Abdomen: soft, non-tender; bowel sounds normal; no masses,  no organomegaly Extremities: extremities normal, atraumatic, no cyanosis or edema Skin: Skin color, texture, turgor normal. No rashes or lesions Lymph nodes: Cervical, supraclavicular, and axillary nodes normal. No abnormal inguinal nodes palpated Neurologic: Grossly normal   Pelvic: External genitalia:  no lesions              Urethra:  normal appearing urethra with no masses, tenderness or lesions              Bartholins and Skenes: normal                 Vagina: normal appearing vagina with normal color and no discharge, no lesions              Cervix: no lesions              Pap taken: No. Bimanual Exam:  Uterus:  normal size, contour, position, consistency, mobility, non-tender              Adnexa: normal adnexa and no mass, fullness, tenderness               Rectovaginal: Confirms              Anus:  normal sphincter tone, no lesions  Chaperone, Octaviano Batty, CMA, was present for exam.  Assessment/Plan: 1. Well woman exam with routine gynecological exam - pap and HR HPV neg 2020.  Repeat next year. - BMD recommended closer to age 65 - lab work will be done with Dr. Elease Hashimoto - MMG due 11/2020 - colonoscopy up to date  2. Herpes genitalis in women - valACYclovir (VALTREX) 500 MG tablet; Take one twice daily for 3 days with any symptoms  Dispense: 30 tablet; Refill: 3  3. Vasomotor symptoms due to menopause - will try gabapentin  4. HSV-2 infection - rf for  valtrex sent to pharmacy  5. H/O bilateral breast implants - MM 3D SCREEN BREAST W/IMPLANT BILATERAL; Future

## 2020-11-16 NOTE — Progress Notes (Signed)
Patient is a 60 year old female here for follow-up on her face surgery.  She is doing much better and looking much better.  She has a little bit of firmness on the left lateral cheek and a little bit of redness in the preauricular and postauricular area.  Just to be safe I am going to send in some amoxicillin for her.  Patient is in agreement.  I like to see her back in a couple weeks and I can always do a telemetry visit if she has any questions.  I recommend massaging any stiff areas and avoiding sun exposure.

## 2020-11-19 ENCOUNTER — Telehealth (INDEPENDENT_AMBULATORY_CARE_PROVIDER_SITE_OTHER): Payer: 59 | Admitting: Psychiatry

## 2020-11-19 ENCOUNTER — Encounter: Payer: Self-pay | Admitting: Psychiatry

## 2020-11-19 VITALS — BP 103/64 | HR 64

## 2020-11-19 DIAGNOSIS — F900 Attention-deficit hyperactivity disorder, predominantly inattentive type: Secondary | ICD-10-CM

## 2020-11-19 DIAGNOSIS — F330 Major depressive disorder, recurrent, mild: Secondary | ICD-10-CM | POA: Diagnosis not present

## 2020-11-19 MED ORDER — METHYLPHENIDATE HCL ER (OSM) 36 MG PO TBCR: 36.0000 mg | EXTENDED_RELEASE_TABLET | Freq: Every day | ORAL | 0 refills | Status: DC

## 2020-11-19 MED ORDER — ARIPIPRAZOLE 2 MG PO TABS: 2.0000 mg | ORAL_TABLET | Freq: Every day | ORAL | 0 refills | Status: DC

## 2020-11-19 NOTE — Progress Notes (Signed)
Heather Davila [id number] [birth year] 60 y.o.  Virtual Visit via Video Note  I connected with pt @ on 11/19/20 at  9:00 AM EDT by a video enabled telemedicine application and verified that I am speaking with the correct person using two identifiers.   I discussed the limitations of evaluation and management by telemedicine and the availability of in person appointments. The patient expressed understanding and agreed to proceed.  I discussed the assessment and treatment plan with the patient. The patient was provided an opportunity to ask questions and all were answered. The patient agreed with the plan and demonstrated an understanding of the instructions.   The patient was advised to call back or seek an in-person evaluation if the symptoms worsen or if the condition fails to improve as anticipated.  I provided 25 minutes of non-face-to-face time during this encounter.  The patient was located at home.  The provider was located at [place].   [name], PMHNP   Subjective:   Patient ID:  Heather Davila is a 60 y.o. (DOB December 17, 1960) female.  Chief Complaint:  Chief Complaint  Patient presents with   Follow-up    History of depression, ADHD, anxiety, and sleep disturbance    HPI Heather Davila presents for follow-up of depression, anxiety, ADHD, and sleep disturbance. She reports "everything I am on seems to be working well." She reports that her gynecologist started her on gabapentin to help with her sleep and this has been minimally effective. Prior to gabapentin she would awaken at 2 am "wide awake." Reports that she slept 8 hours after taking Gabapentin 200 mg po QHS. Last night slept 7.5-8 hours with Gabapentin 100 mg and Klonopin 0.5 mg po QHS. She reports that her sleep has been disrupted for the last 10 years since she started menopause.  She reports that she has decreased Klonopin to 0.5 mg po QHS.   She notices "a little bit" of depression "but it is  manageable for sure." She reports that anxiety is less and is not grinding her teeth as much. Denies panic. She reports energy and motivation. She reports that her appetite has been good and denies any increase in appetite. She reports that Concerta 36 is more effective than the 27 mg. She reports that she has had difficulty with concentration with 27 mg dose. Denies SI.   She reports that Rexulti was not helpful for her mood and Abilify remains the most helpful for her depressive signs and symptoms.  Past Psychiatric Medication Trials: Effexor XR- Effective, well tolerated. Trintellix - Initially effective and then was not effective when re-started Paxil- Ineffective. Helped initially. Prozac-Insomnia Lexapro- Effective and then no longer as effective Zoloft- Initially effective and then not as effective.  Cymbalta- Ineffective. Helped initially. Wellbutrin XL-Ineffective.May have increased anxiety. Amitriptyline Nortriptyline Abilify- Helpful but caused severe insomnia. Seroquel - Ineffective Risperdal Olanzapine- Ineffective Klonopin- Effective Temazepam- Took during menopause Lunesta- Ineffective Ambien-Ineffective Sonata- Ineffective Trazodone- Ineffective Remeron-Ineffective. Does not recall wt gain.  Adderall- Took once and had adverse effect. Concerta- Effective Lamictal- May have had an adverse effects. "Felt weird." Reports worsening s/s.  Lithium Gabapentin  Review of Systems:  Review of Systems  Musculoskeletal:  Negative for gait problem.  Neurological:  Negative for tremors.       Denies any involuntary movements  Psychiatric/Behavioral:         Please refer to HPI   Medications: I have reviewed the patient's current medications.  Current Outpatient Medications  Medication Sig Dispense Refill  amoxicillin (AMOXIL) 500 MG capsule Take 1 capsule (500 mg total) by mouth 3 (three) times daily for 5 days. 15 capsule 0   doxycycline (VIBRAMYCIN) 50 MG capsule  Take 50 mg by mouth daily.     estradiol (ESTRACE VAGINAL) 0.1 MG/GM vaginal cream 1 gram pv twice weekly at bedtime 42.5 g 3   gabapentin (NEURONTIN) 100 MG capsule Take 1 capsul nightly for 5 nights.  Can increase to 2 capsules nightly after that time as needed. 60 capsule 1   lithium carbonate 300 MG capsule Take 1 capsule (300 mg total) by mouth at bedtime. 90 capsule 1   MAGNESIUM PO Take by mouth.     [START ON 01/14/2021] methylphenidate (CONCERTA) 36 MG PO CR tablet Take 1 tablet (36 mg total) by mouth daily. 30 tablet 0   [START ON 12/17/2020] methylphenidate (CONCERTA) 36 MG PO CR tablet Take 1 tablet (36 mg total) by mouth daily. 30 tablet 0   Multiple Vitamin (MULTIVITAMIN) tablet Take 1 tablet by mouth daily.     PARoxetine (PAXIL) 40 MG tablet Take 1/2 tablet daily for one week, then increase to 1 tablet daily 90 tablet 1   ARIPiprazole (ABILIFY) 2 MG tablet Take 1 tablet (2 mg total) by mouth daily. 90 tablet 0   clonazePAM (KLONOPIN) 0.5 MG tablet Take 1.5 tablets (0.75 mg total) by mouth at bedtime. 45 tablet 2   ibuprofen (ADVIL,MOTRIN) 200 MG tablet Take 200 mg by mouth every 6 (six) hours as needed.     methylphenidate (CONCERTA) 36 MG PO CR tablet Take 1 tablet (36 mg total) by mouth daily. 30 tablet 0   ondansetron (ZOFRAN) 4 MG tablet Take 1 tablet (4 mg total) by mouth every 8 (eight) hours as needed for nausea or vomiting. 20 tablet 0   valACYclovir (VALTREX) 500 MG tablet Take one twice daily for 3 days with any symptoms 30 tablet 3   No current facility-administered medications for this visit.    Medication Side Effects: Sleep Problems  Allergies:  Allergies  Allergen Reactions   Ciprofloxacin     REACTION: hives   Oxycodone-Acetaminophen     REACTION: hives    Past Medical History:  Diagnosis Date   Adult acne    Anemia    Anorexia nervosa teen   DEPRESSION 08/09/2009   Hematoma 09/2020   post op after face lift   Insomnia    STD (sexually transmitted  disease) 08/05/2013   HSV2?, 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   TRANSAMINASES, SERUM, ELEVATED 08/14/2009    Family History  Problem Relation Age of Onset   Hypertension Mother    Colon cancer Mother 74       colon rescection   Colon polyps Mother    Heart disease Father 53       CABG62   Stroke Father 34   Heart disease Paternal Grandfather    Heart attack Sister 36       no stents   Depression Sister    Heart failure Paternal Aunt    Diabetes Paternal Uncle    Esophageal cancer Neg Hx    Rectal cancer Neg Hx    Stomach cancer Neg Hx     Social History   Socioeconomic History   Marital status: Married    Spouse name: [name]   Number of children: 3   Years of education: Master's   Highest education level: Not on file  Occupational History   Occupation: Marine scientist  Employer: [employer]  Tobacco Use   Smoking status: Former    Packs/day: 0.50    Years: 5.00    Pack years: 2.50    Types: Cigarettes    Quit date: 07/10/1987    Years since quitting: 33.3   Smokeless tobacco: Never  Vaping Use   Vaping Use: Never used  Substance and Sexual Activity   Alcohol use: No   Drug use: No   Sexual activity: Yes    Partners: Male    Birth control/protection: Post-menopausal, Other-see comments    Comment: vasectomy  Other Topics Concern   Not on file  Social History Narrative   Patient is married Tax inspector) and lives at home with her husband and two children.   Patient has three children.   Patient works at [employer].   Patient has a Master's degree.   Patient is left-handed.   Patient drinks 6 cups of coffee daily.   Social Determinants of Health   Financial Resource Strain: Not on file  Food Insecurity: Not on file  Transportation Needs: Not on file  Physical Activity: Not on file  Stress: Not on file  Social Connections: Not on file  Intimate Partner Violence: Not on file    Past Medical History, Surgical history, Social history, and  Family history were reviewed and updated as appropriate.   Please see review of systems for further details on the patient's review from today.   Objective:   Physical Exam:  BP 103/64   Pulse 64   LMP 12/27/2007 (Exact Date)   Physical Exam Neurological:     Mental Status: She is alert and oriented to person, place, and time.     Cranial Nerves: No dysarthria.  Psychiatric:        Attention and Perception: Attention and perception normal.        Mood and Affect: Mood normal.        Speech: Speech normal.        Behavior: Behavior is cooperative.        Thought Content: Thought content normal. Thought content is not paranoid or delusional. Thought content does not include homicidal or suicidal ideation. Thought content does not include homicidal or suicidal plan.        Cognition and Memory: Cognition and memory normal.        Judgment: Judgment normal.     Comments: Insight intact    Lab Review:     Component Value Date/Time   NA 137 01/26/2020 0920   K 4.3 01/26/2020 0920   CL 99 01/26/2020 0920   CO2 27 01/26/2020 0920   GLUCOSE 84 01/26/2020 0920   BUN 13 01/26/2020 0920   CREATININE 0.68 01/26/2020 0920   CALCIUM 10.2 01/26/2020 0920   PROT 7.2 01/26/2020 0920   ALBUMIN 4.8 08/31/2018 0912   AST 91 (H) 01/26/2020 0920   ALT 194 (H) 01/26/2020 0920   ALKPHOS 75 08/31/2018 0912   BILITOT 0.5 01/26/2020 0920   GFRNONAA >90 05/01/2014 1645   GFRAA >90 05/01/2014 1645       Component Value Date/Time   WBC 4.2 01/26/2020 0920   RBC 3.98 01/26/2020 0920   HGB 13.7 01/26/2020 0920   HGB 14.1 10/26/2014 1034   HCT 39.3 01/26/2020 0920   PLT 255 01/26/2020 0920   MCV 98.7 01/26/2020 0920   MCH 34.4 (H) 01/26/2020 0920   MCHC 34.9 01/26/2020 0920   RDW 11.7 01/26/2020 0920   LYMPHSABS 1,247 01/26/2020 0920  MONOABS 0.3 08/31/2018 0912   EOSABS 218 01/26/2020 0920   BASOSABS 80 01/26/2020 0920    No results found for: POCLITH, LITHIUM   No results found  for: PHENYTOIN, PHENOBARB, VALPROATE, CBMZ   .res Assessment: Plan:    Patient seen for 30 minutes and time spent discussing treatment plan.  She reports that Abilify has been the most effective medication for her depression and that benefits outweigh sleep disturbance.  She reports that her sleep has improved with the addition of gabapentin from her OB/GYN and she plans to continue gabapentin 200 mg at bedtime for insomnia. Will continue Abilify 2 mg daily for augmentation of depression. Continue lithium 300 mg at bedtime for depression and mood stabilization. Continue Klonopin 0.5-0.75 mg for insomnia.  Discussed gradually reducing Klonopin to avoid potential rebound insomnia. Continue Paxil 40 mg daily for anxiety and depression. Will increase Concerta to 36 mg daily since this dose was previously well-tolerated and she reports worsening concentration with lower doses. Patient to follow-up in 3 months or sooner if clinically indicated. Patient advised to contact office with any questions, adverse effects, or acute worsening in signs and symptoms.   Heather Davila was seen today for follow-up.  Diagnoses and all orders for this visit:  Attention deficit hyperactivity disorder (ADHD), predominantly inattentive type -     methylphenidate (CONCERTA) 36 MG PO CR tablet; Take 1 tablet (36 mg total) by mouth daily. -     methylphenidate (CONCERTA) 36 MG PO CR tablet; Take 1 tablet (36 mg total) by mouth daily.  Mild episode of recurrent major depressive disorder (HCC) -     ARIPiprazole (ABILIFY) 2 MG tablet; Take 1 tablet (2 mg total) by mouth daily.    Please see After Visit Summary for patient specific instructions.  Future Appointments  Date Time Provider [place]  11/26/2020  8:30 AM Evelina Bucy, DPM [facility]  11/28/2020  7:00 AM Elease Hashimoto, Alinda Sierras, MD [facility]  11/30/2020  8:15 AM Dilling[place], Loel Lofty, DO [facility] None  12/10/2020  8:00 AM [facility]   02/18/2021  8:30 AM [name], PMHNP [facility] None  12/16/2021  8:15 AM Megan Salon, MD [facility]    No orders of the defined types were placed in this encounter.     -------------------------------

## 2020-11-26 ENCOUNTER — Ambulatory Visit (INDEPENDENT_AMBULATORY_CARE_PROVIDER_SITE_OTHER): Payer: Managed Care, Other (non HMO) | Admitting: Podiatry

## 2020-11-26 ENCOUNTER — Other Ambulatory Visit: Payer: Self-pay

## 2020-11-26 ENCOUNTER — Ambulatory Visit (INDEPENDENT_AMBULATORY_CARE_PROVIDER_SITE_OTHER): Payer: Managed Care, Other (non HMO)

## 2020-11-26 DIAGNOSIS — M2042 Other hammer toe(s) (acquired), left foot: Secondary | ICD-10-CM | POA: Diagnosis not present

## 2020-11-26 DIAGNOSIS — M2012 Hallux valgus (acquired), left foot: Secondary | ICD-10-CM

## 2020-11-26 DIAGNOSIS — M21612 Bunion of left foot: Secondary | ICD-10-CM

## 2020-11-26 DIAGNOSIS — M357 Hypermobility syndrome: Secondary | ICD-10-CM

## 2020-11-26 NOTE — Progress Notes (Signed)
  Subjective:  Patient ID: Heather Davila, female    DOB: 12/20/1960,  MRN: [id number]  Chief Complaint  Patient presents with   lesion    Lt 2nd toe medial side lesion x years -no injury - not painful but annoying -no redness/swelling/draiange/open -Tx: filling, OTC products and pedicures   60 y.o. female presents with the above complaint. History confirmed with patient.   Objective:  Physical Exam: warm, good capillary refill, no trophic changes or ulcerative lesions, normal DP and PT pulses, and normal sensory exam. Left Foot: hallux valgus left, not trackbound. POP 2nd toe PIPJ with large HPK. HPK lateral hallux IPJ, medial plantar distal hallux. 1st TMT gross hypermobility.    No images are attached to the encounter.  Radiographs: X-ray of the left foot: no fracture, dislocation, swelling or degenerative changes noted. Moderate hallux valgus deformity with 2nd metatarsal thickening. Cystic changes medial 1st met head. Assessment:   1. Hallux valgus, left   2. Hammer toe of left foot   3. Foot joint hypermobility    Plan:  Patient was evaluated and treated and all questions answered.  Bunion and Hammertoe -XR reviewed with patient -Educated on etiology of deformity -Patient has failed all conservative therapy and wishes to proceed with surgical intervention. All risks, benefits, and alternatives discussed with patient. No guarantees given. Consent reviewed and signed by patient. -Planned procedures: Lapidus bunion correction left, possible Akin osteotomy, 2nd hammertoe correction with pin fixation -ASA 1 - Normal health patient    No follow-ups on file.

## 2020-11-27 ENCOUNTER — Other Ambulatory Visit: Payer: Self-pay

## 2020-11-28 ENCOUNTER — Encounter: Payer: Self-pay | Admitting: Family Medicine

## 2020-11-28 ENCOUNTER — Other Ambulatory Visit: Payer: Self-pay | Admitting: Podiatry

## 2020-11-28 ENCOUNTER — Ambulatory Visit (INDEPENDENT_AMBULATORY_CARE_PROVIDER_SITE_OTHER): Payer: Managed Care, Other (non HMO) | Admitting: Family Medicine

## 2020-11-28 VITALS — BP 110/60 | Temp 97.9°F | Ht 60.0 in | Wt 94.5 lb

## 2020-11-28 DIAGNOSIS — M2012 Hallux valgus (acquired), left foot: Secondary | ICD-10-CM

## 2020-11-28 DIAGNOSIS — Z0000 Encounter for general adult medical examination without abnormal findings: Secondary | ICD-10-CM | POA: Diagnosis not present

## 2020-11-28 LAB — CBC WITH DIFFERENTIAL/PLATELET
Basophils Absolute: 0.1 10*3/uL (ref 0.0–0.1)
Basophils Relative: 2.3 % (ref 0.0–3.0)
Eosinophils Absolute: 0.1 10*3/uL (ref 0.0–0.7)
Eosinophils Relative: 3.3 % (ref 0.0–5.0)
HCT: 38.1 % (ref 36.0–46.0)
Hemoglobin: 13.2 g/dL (ref 12.0–15.0)
Lymphocytes Relative: 31 % (ref 12.0–46.0)
Lymphs Abs: 1.1 10*3/uL (ref 0.7–4.0)
MCHC: 34.6 g/dL (ref 30.0–36.0)
MCV: 96.6 fl (ref 78.0–100.0)
Monocytes Absolute: 0.3 10*3/uL (ref 0.1–1.0)
Monocytes Relative: 7.7 % (ref 3.0–12.0)
Neutro Abs: 2 10*3/uL (ref 1.4–7.7)
Neutrophils Relative %: 55.7 % (ref 43.0–77.0)
Platelets: 257 10*3/uL (ref 150.0–400.0)
RBC: 3.95 Mil/uL (ref 3.87–5.11)
RDW: 12.4 % (ref 11.5–15.5)
WBC: 3.7 10*3/uL — ABNORMAL LOW (ref 4.0–10.5)

## 2020-11-28 LAB — BASIC METABOLIC PANEL
BUN: 15 mg/dL (ref 6–23)
CO2: 26 mEq/L (ref 19–32)
Calcium: 9.8 mg/dL (ref 8.4–10.5)
Chloride: 101 mEq/L (ref 96–112)
Creatinine, Ser: 0.6 mg/dL (ref 0.40–1.20)
GFR: 98.8 mL/min (ref >60.00)
Glucose, Bld: 89 mg/dL (ref 70–99)
Potassium: 4 mEq/L (ref 3.5–5.1)
Sodium: 137 mEq/L (ref 135–145)

## 2020-11-28 LAB — HEPATIC FUNCTION PANEL
ALT: 81 U/L — ABNORMAL HIGH (ref 0–35)
AST: 50 U/L — ABNORMAL HIGH (ref 0–37)
Albumin: 4.6 g/dL (ref 3.5–5.2)
Alkaline Phosphatase: 64 U/L (ref 39–117)
Bilirubin, Direct: 0.1 mg/dL (ref 0.0–0.3)
Total Bilirubin: 0.4 mg/dL (ref 0.2–1.2)
Total Protein: 6.9 g/dL (ref 6.0–8.3)

## 2020-11-28 LAB — TSH: TSH: 2.38 u[IU]/mL (ref 0.35–5.50)

## 2020-11-28 NOTE — Progress Notes (Signed)
Established Patient Office Visit  Subjective:  Patient ID: Heather Davila, female    DOB: 06/12/1960  Age: 60 y.o. MRN: [id number]  CC:  Chief Complaint  Patient presents with   Annual Exam    No new concerns     HPI [name] presents for physical exam.  She is followed by gynecologist for yearly Pap smears and also gets mammograms through their office.  She has history of chronic idiopathic constipation.  She is taken medications per GI including Linzess without much improvement.  Sometimes goes up to 10 days without bowel movements.  Her colonoscopy is up-to-date but apparently needs repeat in 1 year.  She is followed by psychiatry and has history of attention deficit disorder and also history of recurrent depression.  On several medications for that including Abilify, lithium, Paxil.  Generally doing well.  Had recent plastic surgical face and recovering from that  History of elevated liver transaminases in the past.  She had extensive GI work-up with no clear etiology found.  At that time she was taking milk thistle and thinks this may have been related.  She no longer takes that supplement.  Health maintenance reviewed  -Pap smear up-to-date.  She states she had one last August -Previous hepatitis C screen 2016 negative -Tetanus due 2029 -Colonoscopy due 6/23 -Mammogram scheduled for couple weeks from now -COVID vaccines up-to-date  Family history-mother is 50 and alive.  She lives in [place].  She has no active medical problems other than osteoporosis.  She still drives.  Her father had CABG age 16 but lived to be age 44 and died of congestive heart failure complications.  She has 2 sisters.  They are generally healthy.  1 has had history of atrial fibrillation.  No family history of diabetes  Social history-she is married has 3 children.  Smoked only briefly back in her 72s.  No alcohol use.  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   TRANSAMINASES, SERUM, ELEVATED 08/14/2009    Past Surgical History:  Procedure Laterality Date   AUGMENTATION MAMMAPLASTY Bilateral    BREAST ENHANCEMENT SURGERY Bilateral    CESAREAN SECTION     X 3   FACELIFT  2022   HYSTEROSCOPY  12/27/2007   and HTA   UPPER GASTROINTESTINAL ENDOSCOPY  2020    Family History  Problem Relation Age of Onset   Cancer Mother 74       colon cancer   Hypertension Mother    Colon cancer Mother 21       colon rescection   Colon polyps Mother    Hypertension Father    Heart disease Father 36       CABG62   Stroke Father 34   Heart disease Sister        atrial fibrillation   Heart attack Sister 10       no stents   Depression Sister    Heart failure Paternal Aunt    Diabetes Paternal Uncle    Heart disease Paternal Grandfather    Esophageal cancer Neg Hx    Rectal cancer Neg Hx    Stomach cancer Neg Hx     Social History   Socioeconomic  History   Marital status: Married    Spouse name: Heather Davila   Number of children: 3   Years of education: Master's   Highest education level: Not on file  Occupational History   Occupation: Optician, dispensing: [employer]  Tobacco Use   Smoking status: Former    Packs/day: 0.50    Years: 5.00    Pack years: 2.50    Types: Cigarettes    Quit date: 07/10/1987    Years since quitting: 33.4   Smokeless tobacco: Never  Vaping Use   Vaping Use: Never used  Substance and Sexual Activity   Alcohol use: No   Drug use: No   Sexual activity: Yes    Partners: Male    Birth control/protection: Post-menopausal, Other-see comments    Comment: vasectomy  Other Topics Concern   Not on file  Social History Narrative   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   Patient  has three children.   Patient works at [employer].   Patient has a Master's degree.   Patient is left-handed.   Patient drinks 6 cups of coffee daily.   Social Determinants of Health   Financial Resource Strain: Not on file  Food Insecurity: Not on file  Transportation Needs: Not on file  Physical Activity: Not on file  Stress: Not on file  Social Connections: Not on file  Intimate Partner Violence: Not on file    Outpatient Medications Prior to Visit  Medication Sig Dispense Refill   ARIPiprazole (ABILIFY) 2 MG tablet Take 1 tablet (2 mg total) by mouth daily. 90 tablet 0   doxycycline (VIBRAMYCIN) 50 MG capsule Take 50 mg by mouth daily.     estradiol (ESTRACE VAGINAL) 0.1 MG/GM vaginal cream 1 gram pv twice weekly at bedtime 42.5 g 3   gabapentin (NEURONTIN) 100 MG capsule Take 1 capsul nightly for 5 nights.  Can increase to 2 capsules nightly after that time as needed. 60 capsule 1   ibuprofen (ADVIL,MOTRIN) 200 MG tablet Take 200 mg by mouth every 6 (six) hours as needed.     lithium carbonate 300 MG capsule Take 1 capsule (300 mg total) by mouth at bedtime. 90 capsule 1   MAGNESIUM PO Take by mouth.     methylphenidate (CONCERTA) 36 MG PO CR tablet Take 1 tablet (36 mg total) by mouth daily. 30 tablet 0   Multiple Vitamin (MULTIVITAMIN) tablet Take 1 tablet by mouth daily.     ondansetron (ZOFRAN) 4 MG tablet Take 1 tablet (4 mg total) by mouth every 8 (eight) hours as needed for nausea or vomiting. 20 tablet 0   PARoxetine (PAXIL) 40 MG tablet Take 1/2 tablet daily for one week, then increase to 1 tablet daily 90 tablet 1   valACYclovir (VALTREX) 500 MG tablet Take one twice daily for 3 days with any symptoms 30 tablet 3   clonazePAM (KLONOPIN) 0.5 MG tablet Take 1.5 tablets (0.75 mg total) by mouth at bedtime. 45 tablet 2   No facility-administered medications prior to visit.    Allergies  Allergen Reactions   Ciprofloxacin     REACTION: hives    Oxycodone-Acetaminophen     REACTION: hives    ROS Review of Systems  Constitutional:  Negative for activity change, appetite change, fatigue, fever and unexpected weight change.  HENT:  Negative for ear pain, hearing loss, sore throat and trouble swallowing.   Eyes:  Negative  for visual disturbance.  Respiratory:  Negative for cough and shortness of breath.   Cardiovascular:  Negative for chest pain and palpitations.  Gastrointestinal:  Negative for abdominal pain, blood in stool, constipation and diarrhea.  Genitourinary:  Negative for dysuria and hematuria.  Musculoskeletal:  Negative for arthralgias, back pain and myalgias.  Skin:  Negative for rash.  Neurological:  Negative for dizziness, syncope and headaches.  Hematological:  Negative for adenopathy.  Psychiatric/Behavioral:  Negative for confusion and dysphoric mood.      Objective:    Physical Exam Constitutional:      General: She is not in acute distress.    Appearance: She is well-developed. She is not ill-appearing.  HENT:     Head: Normocephalic and atraumatic.     Right Ear: Tympanic membrane normal.     Left Ear: Tympanic membrane normal.  Eyes:     Pupils: Pupils are equal, round, and reactive to light.  Neck:     Thyroid: No thyromegaly.  Cardiovascular:     Rate and Rhythm: Normal rate and regular rhythm.     Heart sounds: Normal heart sounds. No murmur heard. Pulmonary:     Effort: No respiratory distress.     Breath sounds: Normal breath sounds. No wheezing or rales.  Abdominal:     General: Bowel sounds are normal. There is no distension.     Palpations: Abdomen is soft. There is no mass.     Tenderness: There is no abdominal tenderness. There is no guarding or rebound.  Musculoskeletal:     Cervical back: Normal range of motion and neck supple.     Right lower leg: No edema.     Left lower leg: No edema.  Lymphadenopathy:     Cervical: No cervical adenopathy.  Skin:    Findings: No rash.   Neurological:     Mental Status: She is alert and oriented to person, place, and time.     Cranial Nerves: No cranial nerve deficit.  Psychiatric:        Behavior: Behavior normal.        Thought Content: Thought content normal.        Judgment: Judgment normal.    BP 110/60 (BP Location: Left Arm, Patient Position: Sitting, Cuff Size: Normal)   Temp 97.9 F (36.6 C) (Oral)   Ht 5' (1.524 m)   Wt 94 lb 8 oz (42.9 kg)   LMP 12/27/2007 (Exact Date)   BMI 18.46 kg/m  Wt Readings from Last 3 Encounters:  11/28/20 94 lb 8 oz (42.9 kg)  11/16/20 92 lb 9.6 oz (42 kg)  08/06/20 93 lb 6.4 oz (42.4 kg)     Health Maintenance Due  Topic Date Due   INFLUENZA VACCINE  11/26/2020    There are no preventive care reminders to display for this patient.  Lab Results  Component Value Date   TSH 2.90 01/26/2020   Lab Results  Component Value Date   WBC 4.2 01/26/2020   HGB 13.7 01/26/2020   HCT 39.3 01/26/2020   MCV 98.7 01/26/2020   PLT 255 01/26/2020   Lab Results  Component Value Date   NA 137 01/26/2020   K 4.3 01/26/2020   CO2 27 01/26/2020   GLUCOSE 84 01/26/2020   BUN 13 01/26/2020   CREATININE 0.68 01/26/2020   BILITOT 0.5 01/26/2020   ALKPHOS 75 08/31/2018   AST 91 (H) 01/26/2020   ALT 194 (H) 01/26/2020   PROT 7.2 01/26/2020   ALBUMIN  4.8 08/31/2018   CALCIUM 10.2 01/26/2020   ANIONGAP 9 05/01/2014   GFR 105.29 08/31/2018   Lab Results  Component Value Date   CHOL 238 (H) 01/26/2020   Lab Results  Component Value Date   HDL 61 01/26/2020   Lab Results  Component Value Date   LDLCALC 159 (H) 01/26/2020   Lab Results  Component Value Date   TRIG 81 01/26/2020   Lab Results  Component Value Date   CHOLHDL 3.9 01/26/2020   No results found for: HGBA1C    Assessment & Plan:   Problem List Items Addressed This Visit   None Visit Diagnoses     Physical exam    -  Primary   Relevant Orders   Hepatic function panel   TSH   CBC with  Differential/Platelet   Basic metabolic panel     Generally healthy 60 year old female.  She has history of recurrent depression followed by psychiatry.  She sees gynecologist regularly.  Does have chronic constipation and is considering establishing with a new GI provider.  -She had recent labs through work which included A1c 5.3%, glucose 67, cholesterol 203, HDL 53, LDL 124, triglyceride 131.  Lipids will not be repeated. -Repeat other labs as above. -She will continue with regular GYN follow-up regarding her Pap smears and mammograms. -She will clarify whether she is due for repeat colonoscopy next year -Continue regular weightbearing exercise.  We did discuss possible discussion of DEXA scan with her GYN given her increased risk with BMI less than 19  No orders of the defined types were placed in this encounter.   Follow-up: No follow-ups on file.    Carolann Littler, MD

## 2020-11-30 ENCOUNTER — Ambulatory Visit (INDEPENDENT_AMBULATORY_CARE_PROVIDER_SITE_OTHER): Payer: Self-pay | Admitting: Plastic Surgery

## 2020-11-30 ENCOUNTER — Other Ambulatory Visit: Payer: Self-pay

## 2020-11-30 DIAGNOSIS — Z719 Counseling, unspecified: Secondary | ICD-10-CM

## 2020-11-30 NOTE — Progress Notes (Signed)
Heather Davila is doing very well.  She is here for follow-up after undergoing a facelift.  She had a hematoma that was evacuated.  She still has a little bit of firmness on the left lateral cheek.  This is improving.  The coloring is improving as well.  I have asked her to continue massaging the area.  I would like to do some laser in the next few months.  She is interested in that.  And I will plan to see her back in 1 month unless she needs [place] sooner.

## 2020-12-06 ENCOUNTER — Ambulatory Visit (HOSPITAL_BASED_OUTPATIENT_CLINIC_OR_DEPARTMENT_OTHER): Payer: Managed Care, Other (non HMO) | Admitting: Radiology

## 2020-12-07 ENCOUNTER — Encounter (HOSPITAL_BASED_OUTPATIENT_CLINIC_OR_DEPARTMENT_OTHER): Payer: Self-pay

## 2020-12-10 ENCOUNTER — Ambulatory Visit (HOSPITAL_BASED_OUTPATIENT_CLINIC_OR_DEPARTMENT_OTHER): Payer: Managed Care, Other (non HMO) | Admitting: Radiology

## 2020-12-12 ENCOUNTER — Other Ambulatory Visit (HOSPITAL_BASED_OUTPATIENT_CLINIC_OR_DEPARTMENT_OTHER): Payer: Self-pay | Admitting: Obstetrics & Gynecology

## 2020-12-12 ENCOUNTER — Encounter (HOSPITAL_BASED_OUTPATIENT_CLINIC_OR_DEPARTMENT_OTHER): Payer: Self-pay

## 2020-12-12 ENCOUNTER — Inpatient Hospital Stay (HOSPITAL_BASED_OUTPATIENT_CLINIC_OR_DEPARTMENT_OTHER)
Admission: RE | Admit: 2020-12-12 | Payer: Managed Care, Other (non HMO) | Source: Ambulatory Visit | Admitting: Radiology

## 2020-12-12 MED ORDER — GABAPENTIN 100 MG PO CAPS: ORAL_CAPSULE | ORAL | 3 refills | Status: DC

## 2021-01-01 ENCOUNTER — Ambulatory Visit: Payer: Managed Care, Other (non HMO) | Admitting: Plastic Surgery

## 2021-01-04 ENCOUNTER — Ambulatory Visit: Payer: BLUE CROSS/BLUE SHIELD

## 2021-01-04 ENCOUNTER — Ambulatory Visit (INDEPENDENT_AMBULATORY_CARE_PROVIDER_SITE_OTHER): Payer: Self-pay | Admitting: Plastic Surgery

## 2021-01-04 ENCOUNTER — Other Ambulatory Visit: Payer: Self-pay

## 2021-01-04 ENCOUNTER — Encounter: Payer: Self-pay | Admitting: Plastic Surgery

## 2021-01-04 DIAGNOSIS — Z719 Counseling, unspecified: Secondary | ICD-10-CM

## 2021-01-04 NOTE — Progress Notes (Signed)
The patient has some scarring on the deep part of the left cheek.  I think that it would now be safe to do some laser and see if we can work that out.  I would like to go ahead and just laser her whole face with the BBL.  She is fine with that.  We will get that scheduled for her.

## 2021-01-15 ENCOUNTER — Other Ambulatory Visit: Payer: Self-pay

## 2021-01-15 ENCOUNTER — Ambulatory Visit (HOSPITAL_BASED_OUTPATIENT_CLINIC_OR_DEPARTMENT_OTHER)
Admission: RE | Admit: 2021-01-15 | Discharge: 2021-01-15 | Disposition: A | Discharge status: Home / Self Care | Payer: Managed Care, Other (non HMO) | Source: Ambulatory Visit | Attending: Obstetrics & Gynecology | Admitting: Obstetrics & Gynecology

## 2021-01-15 DIAGNOSIS — Z1231 Encounter for screening mammogram for malignant neoplasm of breast: Secondary | ICD-10-CM | POA: Insufficient documentation

## 2021-01-15 DIAGNOSIS — Z9882 Breast implant status: Secondary | ICD-10-CM | POA: Insufficient documentation

## 2021-01-25 ENCOUNTER — Telehealth (INDEPENDENT_AMBULATORY_CARE_PROVIDER_SITE_OTHER): Payer: 59 | Admitting: Psychiatry

## 2021-01-25 ENCOUNTER — Encounter: Payer: Self-pay | Admitting: Psychiatry

## 2021-01-25 DIAGNOSIS — G4700 Insomnia, unspecified: Secondary | ICD-10-CM

## 2021-01-25 DIAGNOSIS — F900 Attention-deficit hyperactivity disorder, predominantly inattentive type: Secondary | ICD-10-CM

## 2021-01-25 DIAGNOSIS — F419 Anxiety disorder, unspecified: Secondary | ICD-10-CM

## 2021-01-25 DIAGNOSIS — F330 Major depressive disorder, recurrent, mild: Secondary | ICD-10-CM

## 2021-01-25 MED ORDER — ARIPIPRAZOLE 2 MG PO TABS: 2.0000 mg | ORAL_TABLET | Freq: Every day | ORAL | 0 refills | Status: DC

## 2021-01-25 MED ORDER — PAROXETINE HCL 40 MG PO TABS: 40.0000 mg | ORAL_TABLET | Freq: Every day | ORAL | 1 refills | Status: DC

## 2021-01-25 MED ORDER — METHYLPHENIDATE HCL ER (OSM) 36 MG PO TBCR: 36.0000 mg | EXTENDED_RELEASE_TABLET | Freq: Every day | ORAL | 0 refills | Status: DC

## 2021-01-25 MED ORDER — PAROXETINE HCL 40 MG PO TABS: ORAL_TABLET | ORAL | 1 refills | Status: DC

## 2021-01-25 MED ORDER — LITHIUM CARBONATE 300 MG PO CAPS: 300.0000 mg | ORAL_CAPSULE | Freq: Every day | ORAL | 1 refills | Status: DC

## 2021-01-25 NOTE — Progress Notes (Signed)
Heather Davila [id number] 04/24/1961 60 y.o.  Virtual Visit via Video Note  I connected with pt @ on 01/25/21 at  1:45 PM EDT by a video enabled telemedicine application and verified that I am speaking with the correct person using two identifiers.   I discussed the limitations of evaluation and management by telemedicine and the availability of in person appointments. The patient expressed understanding and agreed to proceed.  I discussed the assessment and treatment plan with the patient. The patient was provided an opportunity to ask questions and all were answered. The patient agreed with the plan and demonstrated an understanding of the instructions.   The patient was advised to call back or seek an in-person evaluation if the symptoms worsen or if the condition fails to improve as anticipated.  I provided 30 minutes of non-face-to-face time during this encounter.  The patient was located at home.  The provider was located at [place].   [name], PMHNP   Subjective:   Patient ID:  Heather Davila is a 60 y.o. (DOB 1961-04-14) female.  Chief Complaint:  Chief Complaint  Patient presents with   Sleeping Problem   Follow-up    Depression, anxiety    HPI Heather Davila presents for follow-up of sleep disturbance, anxiety, and depression. She reports poor sleep. She reports that she has been taking Gabapentin 200 mg po QHS and it helps with sleep initiation. She is usually waking up every night around midnight and takes additional Gabapentin and then is able to return to sleep. She reports that if she does not take Gabapentin in the middle night she will not be able to return to sleep. Goes to bed around 8-8:30 pm. She reports that she does not experience excessive daytime somnolence with Gabapentin.   Denies depressed mood. She reports occ "little bouts" of depression. Denies anxiety. Energy and motivation have been good. Appetite has been stable. Concentration has  been improved. Denies SI.   She reports that work has been ok.   Her middle son has medical problems and learned that son has additional scar tissue in his lungs. He has vascular lymphatic malformation. Son had his leg amputated. Son lives with them. Husband retired.   Typically taking Klonopin 1 tab at bedtime.  Past Psychiatric Medication Trials: Effexor XR- Effective, well tolerated. Trintellix - Initially effective and then was not effective when re-started Paxil- Ineffective. Helped initially. Prozac-Insomnia Lexapro- Effective and then no longer as effective Zoloft- Initially effective and then not as effective.  Cymbalta- Ineffective. Helped initially. Wellbutrin XL-Ineffective.May have increased anxiety. Amitriptyline Nortriptyline Abilify- Helpful but caused severe insomnia. Seroquel - Ineffective Risperdal Olanzapine- Ineffective Klonopin- Effective Temazepam- Took during menopause Lunesta- Ineffective Ambien-Ineffective Sonata- Ineffective Trazodone- Ineffective Remeron-Ineffective. Does not recall wt gain.  Adderall- Took once and had adverse effect. Concerta- Effective Lamictal- May have had an adverse effects. "Felt weird." Reports worsening s/s.  Lithium Gabapentin    Review of Systems:  Review of Systems  Cardiovascular:  Negative for palpitations.  Gastrointestinal: Negative.   Musculoskeletal:  Negative for gait problem.       Sciatica  Neurological:  Negative for tremors.  Psychiatric/Behavioral:         Please refer to HPI   Medications: I have reviewed the patient's current medications.  Current Outpatient Medications  Medication Sig Dispense Refill   doxycycline (VIBRAMYCIN) 50 MG capsule Take 50 mg by mouth daily.     estradiol (ESTRACE VAGINAL) 0.1 MG/GM vaginal cream 1 gram pv twice weekly at  bedtime 42.5 g 3   gabapentin (NEURONTIN) 100 MG capsule Take 2 capsules nightly. 180 capsule 3   ibuprofen (ADVIL,MOTRIN) 200 MG tablet Take 200 mg  by mouth every 6 (six) hours as needed.     MAGNESIUM PO Take by mouth.     [START ON 02/24/2021] methylphenidate (CONCERTA) 36 MG PO CR tablet Take 1 tablet (36 mg total) by mouth daily. 30 tablet 0   [START ON 03/24/2021] methylphenidate (CONCERTA) 36 MG PO CR tablet Take 1 tablet (36 mg total) by mouth daily. 30 tablet 0   Multiple Vitamin (MULTIVITAMIN) tablet Take 1 tablet by mouth daily.     valACYclovir (VALTREX) 500 MG tablet Take one twice daily for 3 days with any symptoms 30 tablet 3   ARIPiprazole (ABILIFY) 2 MG tablet Take 1 tablet (2 mg total) by mouth daily. 90 tablet 0   clonazePAM (KLONOPIN) 0.5 MG tablet Take 1.5 tablets (0.75 mg total) by mouth at bedtime. 45 tablet 2   lithium carbonate 300 MG capsule Take 1 capsule (300 mg total) by mouth at bedtime. 90 capsule 1   [START ON 01/27/2021] methylphenidate (CONCERTA) 36 MG PO CR tablet Take 1 tablet (36 mg total) by mouth daily. 30 tablet 0   PARoxetine (PAXIL) 40 MG tablet Take 1/2 tablet daily for one week, then increase to 1 tablet daily 90 tablet 1   No current facility-administered medications for this visit.    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   TRANSAMINASES, SERUM, ELEVATED 08/14/2009    Family History  Problem Relation Age of Onset   Cancer Mother 67       colon cancer   Hypertension Mother    Colon cancer Mother 41       colon rescection   Colon polyps Mother    Hypertension Father    Heart disease Father 16       CABG62   Stroke Father 17   Heart disease Sister        atrial fibrillation   Heart attack Sister 40        no stents   Depression Sister    Heart failure Paternal Aunt    Diabetes Paternal Uncle    Heart disease Paternal Grandfather    Esophageal cancer Neg Hx    Rectal cancer Neg Hx    Stomach cancer Neg Hx     Social History   Socioeconomic History   Marital status: Married    Spouse name: [name]   Number of children: 3   Years of education: Master's   Highest education level: Not on file  Occupational History   Occupation: Optician, dispensing: [employer]  Tobacco Use   Smoking status: Former    Packs/day: 0.50    Years: 5.00    Pack years: 2.50    Types: Cigarettes    Quit date: 07/10/1987    Years since quitting: 33.5   Smokeless tobacco: Never  Vaping Use   Vaping Use: Never used  Substance and Sexual Activity   Alcohol use: No   Drug use: No   Sexual activity: Yes    Partners: Male    Birth control/protection: Post-menopausal, Other-see comments    Comment: vasectomy  Other Topics Concern   Not on file  Social History Narrative   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   Patient has three children.   Patient works at [employer].   Patient has a Master's degree.   Patient is left-handed.   Patient drinks 6 cups of coffee daily.   Social Determinants of Health   Financial Resource Strain: Not on file  Food Insecurity: Not on file  Transportation Needs: Not on file  Physical Activity: Not on file  Stress: Not on file  Social Connections: Not on file  Intimate Partner Violence: Not on file    Past Medical History, Surgical history, Social history, and Family history were reviewed and updated as appropriate.   Please see review of systems for further details on the patient's review from today.   Objective:   Physical Exam:  LMP 12/27/2007 (Exact Date)   Physical Exam Neurological:     Mental Status: She is alert and oriented to person, place, and time.     Cranial Nerves: No  dysarthria.  Psychiatric:        Attention and Perception: Attention and perception normal.        Mood and Affect: Mood normal.        Speech: Speech normal.        Behavior: Behavior is cooperative.        Thought Content: Thought content normal. Thought content is not paranoid or delusional. Thought content does not include homicidal or suicidal ideation. Thought content does not include homicidal or suicidal plan.        Cognition and Memory: Cognition and memory normal.        Judgment: Judgment normal.     Comments: Insight intact    Lab Review:     Component Value Date/Time   NA 137 11/28/2020 0733   K 4.0 11/28/2020 0733   CL 101 11/28/2020 0733   CO2 26 11/28/2020 0733   GLUCOSE 89 11/28/2020 0733   BUN 15 11/28/2020 0733   CREATININE 0.60 11/28/2020 0733   CREATININE 0.68 01/26/2020 0920   CALCIUM 9.8 11/28/2020 0733   PROT 6.9 11/28/2020 0733   ALBUMIN 4.6 11/28/2020 0733   AST 50 (H) 11/28/2020 0733   ALT 81 (H) 11/28/2020 0733   ALKPHOS 64 11/28/2020 0733   BILITOT 0.4 11/28/2020 0733   GFRNONAA >90 05/01/2014 1645   GFRAA >90 05/01/2014 1645       Component Value Date/Time   WBC 3.7 (L) 11/28/2020 0733   RBC 3.95 11/28/2020 0733   HGB 13.2 11/28/2020 0733   HGB 14.1 10/26/2014 1034   HCT 38.1 11/28/2020 0733   PLT 257.0 11/28/2020 0733   MCV 96.6 11/28/2020 0733   MCH 34.4 (H) 01/26/2020 0920   MCHC 34.6 11/28/2020 0733   RDW 12.4 11/28/2020 0733   LYMPHSABS 1.1 11/28/2020 0733   MONOABS 0.3 11/28/2020 0733   EOSABS 0.1 11/28/2020 0733   BASOSABS 0.1 11/28/2020 0733    No results found for: POCLITH, LITHIUM   No results found for: PHENYTOIN, PHENOBARB, VALPROATE, CBMZ   .res Assessment: Plan:    Pt seen for 30 minutes and time spent counseling pt regarding insomnia and possible treatment options. Recommend  increasing Gabapentin before bedtime to 300-400 mg po QHS to determine if these doses may help prevent middle of the night awakenings.  Discussed that she could continue to take Gabapentin as needed for middle of the night awakenings if needed. Requested the pt message provider in MyChart about response to increased dose of Gabapentin to determine which dose and administration schedule is most effective prior to sending additional script.  Will continue Abilify 2 mg po qd for mood s/s. Continue Lithium 300 mg po QHS for depression.  Continue Paxil 40 mg po qd for anxiety and depression.  Pt to follow-up in 6-8 weeks or sooner if clinically indicated.  Patient advised to contact office with any questions, adverse effects, or acute worsening in signs and symptoms.   Heather Davila was seen today for sleeping problem and follow-up.  Diagnoses and all orders for this visit:  Mild episode of recurrent major depressive disorder (HCC) -     ARIPiprazole (ABILIFY) 2 MG tablet; Take 1 tablet (2 mg total) by mouth daily. -     lithium carbonate 300 MG capsule; Take 1 capsule (300 mg total) by mouth at bedtime. -     PARoxetine (PAXIL) 40 MG tablet; Take 1/2 tablet daily for one week, then increase to 1 tablet daily  Attention deficit hyperactivity disorder (ADHD), predominantly inattentive type -     methylphenidate (CONCERTA) 36 MG PO CR tablet; Take 1 tablet (36 mg total) by mouth daily. -     methylphenidate (CONCERTA) 36 MG PO CR tablet; Take 1 tablet (36 mg total) by mouth daily. -     methylphenidate (CONCERTA) 36 MG PO CR tablet; Take 1 tablet (36 mg total) by mouth daily.  Anxiety -     PARoxetine (PAXIL) 40 MG tablet; Take 1/2 tablet daily for one week, then increase to 1 tablet daily    Please see After Visit Summary for patient specific instructions.  Future Appointments  Date Time Provider [place]  02/01/2021  8:15 AM Dillingham, Loel Lofty, DO [facility] None  02/18/2021  8:30 AM [name], PMHNP [facility] None  12/16/2021  8:15 AM Megan Salon, MD [facility]    No orders of the defined types were placed in  this encounter.     -------------------------------

## 2021-01-28 MED ORDER — GABAPENTIN 300 MG PO CAPS: 300.0000 mg | ORAL_CAPSULE | Freq: Every day | ORAL | 1 refills | Status: DC

## 2021-01-28 MED ORDER — GABAPENTIN 100 MG PO CAPS: ORAL_CAPSULE | ORAL | 1 refills | Status: DC

## 2021-02-01 ENCOUNTER — Encounter: Payer: Self-pay | Admitting: Plastic Surgery

## 2021-02-01 ENCOUNTER — Ambulatory Visit (INDEPENDENT_AMBULATORY_CARE_PROVIDER_SITE_OTHER): Payer: Self-pay | Admitting: Plastic Surgery

## 2021-02-01 ENCOUNTER — Other Ambulatory Visit: Payer: Self-pay

## 2021-02-01 DIAGNOSIS — Z719 Counseling, unspecified: Secondary | ICD-10-CM

## 2021-02-01 NOTE — Progress Notes (Signed)
Preoperative Dx: post face lift  Postoperative Dx:  same  Procedure: laser to face   Anesthesia: none  Description of Procedure:  Risks and complications were explained to the patient. Consent was confirmed and signed. Time out was called and all information was confirmed to be correct. The area  area was prepped with alcohol and wiped dry. The BBL laser was set at 6 J/cm2. The face was lasered. The patient tolerated the procedure well and there were no complications. The patient is to follow up in 4 weeks.

## 2021-02-07 MED ORDER — GABAPENTIN 100 MG PO CAPS: ORAL_CAPSULE | ORAL | 1 refills | Status: DC

## 2021-02-07 NOTE — Addendum Note (Signed)
Addended by: Sharyl Nimrod on: 02/07/2021 03:53 PM   Modules accepted: Orders

## 2021-02-18 ENCOUNTER — Telehealth: Payer: BLUE CROSS/BLUE SHIELD | Admitting: Psychiatry

## 2021-03-04 ENCOUNTER — Telehealth (INDEPENDENT_AMBULATORY_CARE_PROVIDER_SITE_OTHER): Payer: 59 | Admitting: Psychiatry

## 2021-03-04 ENCOUNTER — Encounter: Payer: Self-pay | Admitting: Psychiatry

## 2021-03-04 DIAGNOSIS — F419 Anxiety disorder, unspecified: Secondary | ICD-10-CM | POA: Diagnosis not present

## 2021-03-04 DIAGNOSIS — G4700 Insomnia, unspecified: Secondary | ICD-10-CM

## 2021-03-04 DIAGNOSIS — F330 Major depressive disorder, recurrent, mild: Secondary | ICD-10-CM | POA: Diagnosis not present

## 2021-03-04 DIAGNOSIS — F900 Attention-deficit hyperactivity disorder, predominantly inattentive type: Secondary | ICD-10-CM

## 2021-03-04 MED ORDER — ARIPIPRAZOLE 2 MG PO TABS: 2.0000 mg | ORAL_TABLET | Freq: Every day | ORAL | 0 refills | Status: DC

## 2021-03-04 MED ORDER — PAROXETINE HCL 40 MG PO TABS: 40.0000 mg | ORAL_TABLET | Freq: Every day | ORAL | 0 refills | Status: DC

## 2021-03-04 MED ORDER — CLONAZEPAM 0.5 MG PO TABS: 0.5000 mg | ORAL_TABLET | Freq: Every day | ORAL | 2 refills | Status: DC

## 2021-03-04 MED ORDER — METHYLPHENIDATE HCL ER (OSM) 36 MG PO TBCR: 36.0000 mg | EXTENDED_RELEASE_TABLET | Freq: Every day | ORAL | 0 refills | Status: DC

## 2021-03-04 NOTE — Progress Notes (Signed)
Heather Davila [id number] Dec 01, 1960 60 y.o.  Virtual Visit via Video Note  I connected with pt @ on 03/04/21 at  9:00 AM EST by a video enabled telemedicine application and verified that I am speaking with the correct person using two identifiers.   I discussed the limitations of evaluation and management by telemedicine and the availability of in person appointments. The patient expressed understanding and agreed to proceed.  I discussed the assessment and treatment plan with the patient. The patient was provided an opportunity to ask questions and all were answered. The patient agreed with the plan and demonstrated an understanding of the instructions.   The patient was advised to call back or seek an in-person evaluation if the symptoms worsen or if the condition fails to improve as anticipated.  I provided 30 minutes of non-face-to-face time during this encounter.  The patient was located at home.  The provider was located at [place].   [name], PMHNP   Subjective:   Patient ID:  Heather Davila is a 60 y.o. (DOB 05-17-1960) female.  Chief Complaint:  Chief Complaint  Patient presents with   Follow-up    Anxiety, depression, insomnia    HPI Heather Davila presents for follow-up of depression, anxiety, and insomnia. She reports, "lots of things have been going, but I think I am handling it ok."  She recognizes some past traumatic events after recent trigger. She reports some intrusive memories. Some anxiety about how to help family member. Denies panic attacks. Reports depression is "ok... it's manageable." She reports adequate sleep with Gabapentin 300 mg po QHS for sleep initiation and 100-200 mg as needed for middle of the night awakening. She reports energy and motivation have been good. Concerta remains effective for concentration. Denies SI.   Has considered seeing a therapist.   Past Psychiatric Medication Trials: Effexor XR- Effective, well  tolerated. Trintellix - Initially effective and then was not effective when re-started Paxil- Ineffective. Helped initially. Prozac-Insomnia Lexapro- Effective and then no longer as effective Zoloft- Initially effective and then not as effective.  Cymbalta- Ineffective. Helped initially. Wellbutrin XL-Ineffective.May have increased anxiety. Amitriptyline Nortriptyline Abilify- Helpful but caused severe insomnia. Seroquel - Ineffective Risperdal Olanzapine- Ineffective Klonopin- Effective Temazepam- Took during menopause Lunesta- Ineffective Ambien-Ineffective Sonata- Ineffective Trazodone- Ineffective Remeron-Ineffective. Does not recall wt gain.  Adderall- Took once and had adverse effect. Concerta- Effective Lamictal- May have had an adverse effects. "Felt weird." Reports worsening s/s.  Lithium Gabapentin   Review of Systems:  Review of Systems  Musculoskeletal:  Negative for gait problem.  Neurological:  Negative for tremors.  Psychiatric/Behavioral:         Please refer to HPI   Medications: I have reviewed the patient's current medications.  Current Outpatient Medications  Medication Sig Dispense Refill   doxycycline (VIBRAMYCIN) 50 MG capsule Take 50 mg by mouth daily.     estradiol (ESTRACE VAGINAL) 0.1 MG/GM vaginal cream 1 gram pv twice weekly at bedtime 42.5 g 3   gabapentin (NEURONTIN) 100 MG capsule Take 1-2 capsules as needed 180 capsule 1   gabapentin (NEURONTIN) 300 MG capsule Take 1 capsule (300 mg total) by mouth at bedtime. 90 capsule 1   ibuprofen (ADVIL,MOTRIN) 200 MG tablet Take 200 mg by mouth every 6 (six) hours as needed.     lithium carbonate 300 MG capsule Take 1 capsule (300 mg total) by mouth at bedtime. 90 capsule 1   MAGNESIUM PO Take by mouth.     Multiple Vitamin (MULTIVITAMIN)  tablet Take 1 tablet by mouth daily.     valACYclovir (VALTREX) 500 MG tablet Take one twice daily for 3 days with any symptoms 30 tablet 3   ARIPiprazole  (ABILIFY) 2 MG tablet Take 1 tablet (2 mg total) by mouth daily. 90 tablet 0   [START ON 04/01/2021] clonazePAM (KLONOPIN) 0.5 MG tablet Take 1 tablet (0.5 mg total) by mouth at bedtime. 30 tablet 2   [START ON 03/24/2021] methylphenidate (CONCERTA) 36 MG PO CR tablet Take 1 tablet (36 mg total) by mouth daily. 30 tablet 0   [START ON 05/19/2021] methylphenidate (CONCERTA) 36 MG PO CR tablet Take 1 tablet (36 mg total) by mouth daily. 30 tablet 0   [START ON 04/21/2021] methylphenidate (CONCERTA) 36 MG PO CR tablet Take 1 tablet (36 mg total) by mouth daily. 30 tablet 0   PARoxetine (PAXIL) 40 MG tablet Take 1 tablet (40 mg total) by mouth daily. 90 tablet 0   No current facility-administered medications for this visit.    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   TRANSAMINASES, SERUM, ELEVATED 08/14/2009    Family History  Problem Relation Age of Onset   Cancer Mother 85       colon cancer   Hypertension Mother    Colon cancer Mother 71       colon rescection   Colon polyps Mother    Hypertension Father    Heart disease Father 56       CABG62   Stroke Father 96   Heart disease Sister        atrial fibrillation   Heart attack Sister 32       no stents   Depression Sister    Heart failure Paternal Aunt    Diabetes Paternal Uncle    Heart disease Paternal Grandfather    Esophageal cancer Neg Hx    Rectal cancer Neg Hx    Stomach cancer Neg Hx     Social History   Socioeconomic History   Marital status: Married    Spouse name: Charles   Number of children: 3   Years of education: Master's    Highest education level: Not on file  Occupational History   Occupation: Optician, dispensing: [employer]  Tobacco Use   Smoking status: Former    Packs/day: 0.50    Years: 5.00    Pack years: 2.50    Types: Cigarettes    Quit date: 07/10/1987    Years since quitting: 33.6   Smokeless tobacco: Never  Vaping Use   Vaping Use: Never used  Substance and Sexual Activity   Alcohol use: No   Drug use: No   Sexual activity: Yes    Partners: Male    Birth control/protection: Post-menopausal, Other-see comments    Comment: vasectomy  Other Topics Concern   Not on file  Social History Narrative   Patient is married Tax inspector) and lives at home with her husband and two children.   Patient has three children.  Patient works at [employer].   Patient has a Master's degree.   Patient is left-handed.   Patient drinks 6 cups of coffee daily.   Social Determinants of Health   Financial Resource Strain: Not on file  Food Insecurity: Not on file  Transportation Needs: Not on file  Physical Activity: Not on file  Stress: Not on file  Social Connections: Not on file  Intimate Partner Violence: Not on file    Past Medical History, Surgical history, Social history, and Family history were reviewed and updated as appropriate.   Please see review of systems for further details on the patient's review from today.   Objective:   Physical Exam:  BP 95/60   Pulse 60   LMP 12/27/2007 (Exact Date)   Physical Exam Neurological:     Mental Status: She is alert and oriented to person, place, and time.     Cranial Nerves: No dysarthria.  Psychiatric:        Attention and Perception: Attention and perception normal.        Mood and Affect: Mood is anxious.        Speech: Speech normal.        Behavior: Behavior is cooperative.        Thought Content: Thought content normal. Thought content is not paranoid or delusional. Thought content does not include homicidal or  suicidal ideation. Thought content does not include homicidal or suicidal plan.        Cognition and Memory: Cognition and memory normal.        Judgment: Judgment normal.     Comments: Insight intact    Lab Review:     Component Value Date/Time   NA 137 11/28/2020 0733   K 4.0 11/28/2020 0733   CL 101 11/28/2020 0733   CO2 26 11/28/2020 0733   GLUCOSE 89 11/28/2020 0733   BUN 15 11/28/2020 0733   CREATININE 0.60 11/28/2020 0733   CREATININE 0.68 01/26/2020 0920   CALCIUM 9.8 11/28/2020 0733   PROT 6.9 11/28/2020 0733   ALBUMIN 4.6 11/28/2020 0733   AST 50 (H) 11/28/2020 0733   ALT 81 (H) 11/28/2020 0733   ALKPHOS 64 11/28/2020 0733   BILITOT 0.4 11/28/2020 0733   GFRNONAA >90 05/01/2014 1645   GFRAA >90 05/01/2014 1645       Component Value Date/Time   WBC 3.7 (L) 11/28/2020 0733   RBC 3.95 11/28/2020 0733   HGB 13.2 11/28/2020 0733   HGB 14.1 10/26/2014 1034   HCT 38.1 11/28/2020 0733   PLT 257.0 11/28/2020 0733   MCV 96.6 11/28/2020 0733   MCH 34.4 (H) 01/26/2020 0920   MCHC 34.6 11/28/2020 0733   RDW 12.4 11/28/2020 0733   LYMPHSABS 1.1 11/28/2020 0733   MONOABS 0.3 11/28/2020 0733   EOSABS 0.1 11/28/2020 0733   BASOSABS 0.1 11/28/2020 0733    No results found for: POCLITH, LITHIUM   No results found for: PHENYTOIN, PHENOBARB, VALPROATE, CBMZ   .res Assessment: Plan:   Pt seen for 30 minutes and time spent discussing potential benefits of therapy to address past trauma and other anxiety triggers. Provided pt with therapy referrals and pt is amenable to starting therapy.  Will continue current medications without changes at this time since mood and anxiety s/s are overall improved with medications.  Continue Abilify 2 mg po qd for depression.  Continue Klonopin 0.5 mg po QHS for anxiety and insomnia.  Continue Gabapentin 300 mg po QHS and 100-200 mg  as needed for middle of the night awakenings.  Continue Lithium 300 mg po QHS for mood s/s.  Continue Paxil  40 mg po qd for anxiety and depression.  Continue Concerta 36 mg po q am for ADHD.  Pt to follow-up in 3 months or sooner if clinically indicated.  Patient advised to contact office with any questions, adverse effects, or acute worsening in signs and symptoms.   Heather Davila was seen today for follow-up.  Diagnoses and all orders for this visit:  Attention deficit hyperactivity disorder (ADHD), predominantly inattentive type -     methylphenidate (CONCERTA) 36 MG PO CR tablet; Take 1 tablet (36 mg total) by mouth daily. -     methylphenidate (CONCERTA) 36 MG PO CR tablet; Take 1 tablet (36 mg total) by mouth daily.  Mild episode of recurrent major depressive disorder (HCC) -     ARIPiprazole (ABILIFY) 2 MG tablet; Take 1 tablet (2 mg total) by mouth daily. -     PARoxetine (PAXIL) 40 MG tablet; Take 1 tablet (40 mg total) by mouth daily.  Anxiety -     clonazePAM (KLONOPIN) 0.5 MG tablet; Take 1 tablet (0.5 mg total) by mouth at bedtime. -     PARoxetine (PAXIL) 40 MG tablet; Take 1 tablet (40 mg total) by mouth daily.  Insomnia, unspecified type -     clonazePAM (KLONOPIN) 0.5 MG tablet; Take 1 tablet (0.5 mg total) by mouth at bedtime.    Please see After Visit Summary for patient specific instructions.  Future Appointments  Date Time Provider [place]  03/08/2021  8:00 AM Dillingham, Loel Lofty, DO [facility] None  05/13/2021  9:00 AM [name], [facility] None  06/04/2021  8:30 AM [name], PMHNP [facility] None  12/16/2021  8:15 AM Megan Salon, MD [facility]    No orders of the defined types were placed in this encounter.     -------------------------------

## 2021-03-08 ENCOUNTER — Ambulatory Visit (INDEPENDENT_AMBULATORY_CARE_PROVIDER_SITE_OTHER): Payer: Self-pay | Admitting: Plastic Surgery

## 2021-03-08 ENCOUNTER — Other Ambulatory Visit: Payer: Managed Care, Other (non HMO) | Admitting: Plastic Surgery

## 2021-03-08 ENCOUNTER — Other Ambulatory Visit: Payer: Self-pay

## 2021-03-08 ENCOUNTER — Encounter: Payer: Self-pay | Admitting: Plastic Surgery

## 2021-03-08 DIAGNOSIS — Z719 Counseling, unspecified: Secondary | ICD-10-CM

## 2021-03-08 NOTE — Progress Notes (Signed)
[place] BBL Preoperative Dx: redness and hyperpigmentation  Postoperative Dx:  same  Procedure: laser to face   Anesthesia: none  Description of Procedure:  Risks and complications were explained to the patient. Consent was confirmed and signed. Time out was called and all information was confirmed to be correct. The area  area was prepped with alcohol and wiped dry. The BBL laser was set at 7 J/cm2 with the 560 nm and 3 Hz.   The face was lasered and then the handpiece switched to 515 nm and 6 J with 2 Hz.  The face was lasered.  The patient tolerated the procedure well and there were no complications. The patient is to follow up in 4 weeks.

## 2021-03-11 ENCOUNTER — Telehealth: Payer: Self-pay | Admitting: Plastic Surgery

## 2021-03-11 NOTE — Telephone Encounter (Signed)
Called and spoke with the patient regarding the message below.  Informed the patient that I spoke with Dr. Marla Roe regarding her message, and she stated that the swelling is good, and the popping could be scar tissue breaking up.  Patient verbalized understanding and agreed.  She stated that she just wanted to make sure everything was okay.    Informed the patient per Dr. Marla Roe if she is concerned about the swelling she can come in and see her today.  Patient stated that was okay.  Asked the patient was she sure she did not want to come in to be seen, and she stated she was sure.//AB/CMA

## 2021-03-11 NOTE — Telephone Encounter (Signed)
Patient had BBL Laser Friday and is concerned about the swelling she is having in the seroma area on her face. She said that the swelling has seemed to increase and she said she believes she had a blister because she felt something pop.  She asked if someone could follow up with her to make sure she doesn't need to come in or anything.  Please follow up

## 2021-03-13 DIAGNOSIS — S0080XA Unspecified superficial injury of other part of head, initial encounter: Secondary | ICD-10-CM | POA: Diagnosis not present

## 2021-03-13 DIAGNOSIS — L718 Other rosacea: Secondary | ICD-10-CM | POA: Diagnosis not present

## 2021-03-26 ENCOUNTER — Encounter: Payer: Self-pay | Admitting: Psychiatry

## 2021-03-26 ENCOUNTER — Telehealth (INDEPENDENT_AMBULATORY_CARE_PROVIDER_SITE_OTHER): Payer: 59 | Admitting: Psychiatry

## 2021-03-26 DIAGNOSIS — F419 Anxiety disorder, unspecified: Secondary | ICD-10-CM

## 2021-03-26 DIAGNOSIS — F332 Major depressive disorder, recurrent severe without psychotic features: Secondary | ICD-10-CM

## 2021-03-26 DIAGNOSIS — F330 Major depressive disorder, recurrent, mild: Secondary | ICD-10-CM

## 2021-03-26 MED ORDER — PAROXETINE HCL 40 MG PO TABS: ORAL_TABLET | ORAL | 0 refills | Status: DC

## 2021-03-26 MED ORDER — VILAZODONE HCL 40 MG PO TABS: ORAL_TABLET | ORAL | 0 refills | Status: DC

## 2021-03-26 NOTE — Progress Notes (Signed)
Heather Davila [id number] 06-15-1960 60 y.o.  Virtual Visit via Video Note  I connected with pt @ on 03/26/21 at  4:00 PM EST by a video enabled telemedicine application and verified that I am speaking with the correct person using two identifiers.   I discussed the limitations of evaluation and management by telemedicine and the availability of in person appointments. The patient expressed understanding and agreed to proceed.  I discussed the assessment and treatment plan with the patient. The patient was provided an opportunity to ask questions and all were answered. The patient agreed with the plan and demonstrated an understanding of the instructions.   The patient was advised to call back or seek an in-person evaluation if the symptoms worsen or if the condition fails to improve as anticipated.  I provided 30 minutes of non-face-to-face time during this encounter.  The patient was located at home.  The provider was located at [place].   [name], PMHNP   Subjective:   Patient ID:  Heather Davila is a 60 y.o. (DOB January 09, 1961) female.  Chief Complaint:  Chief Complaint  Patient presents with   Depression    HPI Heather Davila presents emergently for worsening depression. She reports that she has noticed some recent depression and questions if Paxil needs to be changed. Reports that she has had some some depression for the last 2 months. Notices some sadness. Some recent lower motivation. Energy has been ok. Lower enjoyment in things. Some diminished interest. "Not bad but creeping back in." Denies anxiety. Appetite has been ok. She reports that sleep is ok- "overall it's better." Concentration has been ok. Denies SI.   Reports things are good at home. Denies any recent stressors.   Does not recall taking Pristiq, Viibryd, Nortriptyline, or Imipramine  Past Psychiatric Medication Trials: Trintellix - Initially effective and then was not effective when  re-started Paxil- Ineffective. Helped initially. Prozac-Insomnia Lexapro- Effective and then no longer as effective Zoloft- Initially effective and then not as effective.  Cymbalta- Ineffective. Helped initially. Effexor XR- Effective, well tolerated. Wellbutrin XL-Ineffective.May have increased anxiety. Amitriptyline Abilify- Helpful but caused severe insomnia. Seroquel - Ineffective Risperdal Olanzapine- Ineffective Klonopin- Effective Temazepam- Took during menopause Lunesta- Ineffective Ambien-Ineffective Sonata- Ineffective Trazodone- Ineffective Remeron-Ineffective. Does not recall wt gain.  Adderall- Took once and had adverse effect. Concerta- Effective Lamictal- May have had an adverse effects. "Felt weird." Reports worsening s/s.  Lithium Gabapentin  Review of Systems:  Review of Systems  Medications: I have reviewed the patient's current medications.  Current Outpatient Medications  Medication Sig Dispense Refill   Vilazodone HCl (VIIBRYD) 40 MG TABS Take 1/2 tablet daily with breakfast for 10 days, then increase to 1 tablet daily with breakfast 30 tablet 0   ARIPiprazole (ABILIFY) 2 MG tablet Take 1 tablet (2 mg total) by mouth daily. 90 tablet 0   [START ON 04/01/2021] clonazePAM (KLONOPIN) 0.5 MG tablet Take 1 tablet (0.5 mg total) by mouth at bedtime. 30 tablet 2   doxycycline (VIBRAMYCIN) 50 MG capsule Take 50 mg by mouth daily.     estradiol (ESTRACE VAGINAL) 0.1 MG/GM vaginal cream 1 gram pv twice weekly at bedtime 42.5 g 3   gabapentin (NEURONTIN) 100 MG capsule Take 1-2 capsules as needed 180 capsule 1   gabapentin (NEURONTIN) 300 MG capsule Take 1 capsule (300 mg total) by mouth at bedtime. 90 capsule 1   ibuprofen (ADVIL,MOTRIN) 200 MG tablet Take 200 mg by mouth every 6 (six) hours as needed.  lithium carbonate 300 MG capsule Take 1 capsule (300 mg total) by mouth at bedtime. 90 capsule 1   MAGNESIUM PO Take by mouth.     methylphenidate (CONCERTA)  36 MG PO CR tablet Take 1 tablet (36 mg total) by mouth daily. 30 tablet 0   [START ON 05/19/2021] methylphenidate (CONCERTA) 36 MG PO CR tablet Take 1 tablet (36 mg total) by mouth daily. 30 tablet 0   [START ON 04/21/2021] methylphenidate (CONCERTA) 36 MG PO CR tablet Take 1 tablet (36 mg total) by mouth daily. 30 tablet 0   Multiple Vitamin (MULTIVITAMIN) tablet Take 1 tablet by mouth daily.     PARoxetine (PAXIL) 40 MG tablet Take 1/2 tablet daily for 10 days, then stop 90 tablet 0   valACYclovir (VALTREX) 500 MG tablet Take one twice daily for 3 days with any symptoms 30 tablet 3   No current facility-administered medications for this visit.    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   TRANSAMINASES, SERUM, ELEVATED 08/14/2009    Family History  Problem Relation Age of Onset   Cancer Mother 68       colon cancer   Hypertension Mother    Colon cancer Mother 65       colon rescection   Colon polyps Mother    Hypertension Father    Heart disease Father 39       CABG62   Stroke Father 42   Heart disease Sister        atrial fibrillation   Heart attack Sister 56       no stents   Depression Sister    Heart failure Paternal Aunt    Diabetes Paternal Uncle    Heart disease Paternal Grandfather    Esophageal cancer Neg Hx    Rectal cancer Neg Hx    Stomach cancer Neg Hx     Social History   Socioeconomic History   Marital status: Married    Spouse name: Charles   Number of children: 3   Years of education: Master's   Highest education level: Not on file  Occupational History   Occupation:  Optician, dispensing: [employer]  Tobacco Use   Smoking status: Former    Packs/day: 0.50    Years: 5.00    Pack years: 2.50    Types: Cigarettes    Quit date: 07/10/1987    Years since quitting: 33.7   Smokeless tobacco: Never  Vaping Use   Vaping Use: Never used  Substance and Sexual Activity   Alcohol use: No   Drug use: No   Sexual activity: Yes    Partners: Male    Birth control/protection: Post-menopausal, Other-see comments    Comment: vasectomy  Other Topics Concern   Not on file  Social History Narrative   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   Patient has three children.   Patient works at [employer].   Patient has a Brewing technologist  degree.   Patient is left-handed.   Patient drinks 6 cups of coffee daily.   Social Determinants of Health   Financial Resource Strain: Not on file  Food Insecurity: Not on file  Transportation Needs: Not on file  Physical Activity: Not on file  Stress: Not on file  Social Connections: Not on file  Intimate Partner Violence: Not on file    Past Medical History, Surgical history, Social history, and Family history were reviewed and updated as appropriate.   Please see review of systems for further details on the patient's review from today.   Objective:   Physical Exam:  LMP 12/27/2007 (Exact Date)   Physical Exam Neurological:     Mental Status: She is alert and oriented to person, place, and time.     Cranial Nerves: No dysarthria.  Psychiatric:        Attention and Perception: Attention and perception normal.        Mood and Affect: Mood is depressed.        Speech: Speech normal.        Behavior: Behavior is cooperative.        Thought Content: Thought content normal. Thought content is not paranoid or delusional. Thought content does not include homicidal or suicidal ideation. Thought content does not include homicidal or suicidal plan.        Cognition and  Memory: Cognition and memory normal.        Judgment: Judgment normal.     Comments: Insight intact    Lab Review:     Component Value Date/Time   NA 137 11/28/2020 0733   K 4.0 11/28/2020 0733   CL 101 11/28/2020 0733   CO2 26 11/28/2020 0733   GLUCOSE 89 11/28/2020 0733   BUN 15 11/28/2020 0733   CREATININE 0.60 11/28/2020 0733   CREATININE 0.68 01/26/2020 0920   CALCIUM 9.8 11/28/2020 0733   PROT 6.9 11/28/2020 0733   ALBUMIN 4.6 11/28/2020 0733   AST 50 (H) 11/28/2020 0733   ALT 81 (H) 11/28/2020 0733   ALKPHOS 64 11/28/2020 0733   BILITOT 0.4 11/28/2020 0733   GFRNONAA >90 05/01/2014 1645   GFRAA >90 05/01/2014 1645       Component Value Date/Time   WBC 3.7 (L) 11/28/2020 0733   RBC 3.95 11/28/2020 0733   HGB 13.2 11/28/2020 0733   HGB 14.1 10/26/2014 1034   HCT 38.1 11/28/2020 0733   PLT 257.0 11/28/2020 0733   MCV 96.6 11/28/2020 0733   MCH 34.4 (H) 01/26/2020 0920   MCHC 34.6 11/28/2020 0733   RDW 12.4 11/28/2020 0733   LYMPHSABS 1.1 11/28/2020 0733   MONOABS 0.3 11/28/2020 0733   EOSABS 0.1 11/28/2020 0733   BASOSABS 0.1 11/28/2020 0733    No results found for: POCLITH, LITHIUM   No results found for: PHENYTOIN, PHENOBARB, VALPROATE, CBMZ   .res Assessment: Plan:    Pt seen for 30 minutes and time spent discussing potential treatment options for her depression and reviewing past medication responses. Discussed that she has typically had more improvement in depressive s/s with SNRI's and meds with novel mechanisms of action compared to [facility]. Therefore, recommend considering either an SNRI, TCA, or Viibryd due to its unique mechanism of action. Reviewed potential benefits, risks, and side effects of Pristiq, Nortriptyline, and Viibryd. Pt agrees to trial of Viibryd. Discussed cross-titration of medications to minimize risk of discontinuation and possible side effects. Pt reports that she typically does not have discontinuation s/s or significant  side  effects with starting medications and therefore requests shorter cross-titration and prefers to use current supply of Paxil to taper. Advised pt to call office if she experiences discontinuation s/s or side effects.  Will decrease Paxil to 40 mg 1/2 tablet for 10 days, then stop.  Will start Viibryd 40 mg 1/2 tablet daily with breakfast for 10 days, then increase to 1 tablet daily with breakfast.  Continue Abilify 2 mg po qd for depression.  Continue Gabapentin 300 mg po QHS for insomnia and 100-200 mg as needed for anxiety.  Continue Lithium 300 mg po QHS for mood s/s.  Continue Concerta 36 mg po q am for ADHD.  Pt to follow-up in 2 months or sooner if clinically indicated.  Patient advised to contact office with any questions, adverse effects, or acute worsening in signs and symptoms.    Kailie was seen today for depression.  Diagnoses and all orders for this visit:  Mild episode of recurrent major depressive disorder (HCC) -     Vilazodone HCl (VIIBRYD) 40 MG TABS; Take 1/2 tablet daily with breakfast for 10 days, then increase to 1 tablet daily with breakfast -     PARoxetine (PAXIL) 40 MG tablet; Take 1/2 tablet daily for 10 days, then stop  Anxiety -     Vilazodone HCl (VIIBRYD) 40 MG TABS; Take 1/2 tablet daily with breakfast for 10 days, then increase to 1 tablet daily with breakfast -     PARoxetine (PAXIL) 40 MG tablet; Take 1/2 tablet daily for 10 days, then stop    Please see After Visit Summary for patient specific instructions.  Future Appointments  Date Time Provider [place]  04/12/2021 11:45 AM Dillingham, Loel Lofty, DO [facility] None  05/13/2021  9:00 AM [name], [facility] CP-CP None  06/04/2021  8:30 AM [name], PMHNP CP-CP None  12/16/2021  8:15 AM Megan Salon, MD [facility]    No orders of the defined types were placed in this encounter.     -------------------------------

## 2021-04-02 ENCOUNTER — Other Ambulatory Visit: Payer: Self-pay

## 2021-04-02 DIAGNOSIS — F419 Anxiety disorder, unspecified: Secondary | ICD-10-CM

## 2021-04-02 DIAGNOSIS — F330 Major depressive disorder, recurrent, mild: Secondary | ICD-10-CM

## 2021-04-02 MED ORDER — VILAZODONE HCL 40 MG PO TABS: ORAL_TABLET | ORAL | 1 refills | Status: DC

## 2021-04-02 NOTE — Telephone Encounter (Signed)
Rx sent for 90 day to [facility] Viibryd 40 mg

## 2021-04-04 ENCOUNTER — Telehealth: Payer: Self-pay | Admitting: Psychiatry

## 2021-04-04 NOTE — Telephone Encounter (Signed)
Im sure you have the PA already but here it is in case you don't

## 2021-04-04 NOTE — Telephone Encounter (Signed)
Viibryd is not covered under the patient's insurance plan. [facility] has set up a PA on [employer] [facility] [employer] with a key# [id number]. Please try to get a PA.

## 2021-04-04 NOTE — Telephone Encounter (Signed)
Noted thanks °

## 2021-04-05 NOTE — Telephone Encounter (Signed)
Prior Authorization submitted and approved for VILAZODONE 40 MG #90 effective 03/06/2021-04/05/2022, PA# [id number] With [facility]

## 2021-04-11 ENCOUNTER — Telehealth (INDEPENDENT_AMBULATORY_CARE_PROVIDER_SITE_OTHER): Payer: 59 | Admitting: Psychiatry

## 2021-04-11 ENCOUNTER — Encounter: Payer: Self-pay | Admitting: Psychiatry

## 2021-04-11 DIAGNOSIS — F330 Major depressive disorder, recurrent, mild: Secondary | ICD-10-CM

## 2021-04-11 DIAGNOSIS — F419 Anxiety disorder, unspecified: Secondary | ICD-10-CM

## 2021-04-11 MED ORDER — ARIPIPRAZOLE 2 MG PO TABS: 2.0000 mg | ORAL_TABLET | Freq: Every day | ORAL | 0 refills | Status: DC

## 2021-04-11 MED ORDER — VILAZODONE HCL 40 MG PO TABS: ORAL_TABLET | ORAL | 0 refills | Status: DC

## 2021-04-11 NOTE — Progress Notes (Signed)
Heather Davila [id number] [birth year] 60 y.o.  Virtual Visit via Video Note  I connected with pt @ on 04/11/21 at  9:30 AM EST by a video enabled telemedicine application and verified that I am speaking with the correct person using two identifiers.   I discussed the limitations of evaluation and management by telemedicine and the availability of in person appointments. The patient expressed understanding and agreed to proceed.  I discussed the assessment and treatment plan with the patient. The patient was provided an opportunity to ask questions and all were answered. The patient agreed with the plan and demonstrated an understanding of the instructions.   The patient was advised to call back or seek an in-person evaluation if the symptoms worsen or if the condition fails to improve as anticipated.  I provided 28 minutes of non-face-to-face time during this encounter.  The patient was located in her personal vehicle.  The provider was located at [place].   [name], PMHNP   Subjective:   Patient ID:  Heather Davila is a 60 y.o. (DOB Oct 22, 1960) female.  Chief Complaint:  Chief Complaint  Patient presents with   Medication Problem    Insomnia   Follow-up    Depression, Anxiety    HPI [name] presents for follow-up of depression, anxiety, and insomnia. She reports that Viibryd 60 mg "is definitely helping" and is no longer waking up crying. She has noticed she is not sleeping as well and has been waking up during the night. Taking 40 mg in the morning and 20 mg in the late afternoon/ early evening. She reports that sleep issues got worse with increased dose and started with initiation.   She reports that "depression is a lot better... I feel normal. I feel good." Reports that she previously had irritability and this has improved. Anxiety has improved with Viibryd. Energy and motivation have improved. Appetite has been good. Reports adequate concentration.  Concentration has improved with Concerta. Reports that she would start and stop different tasks and chores at home previously. Denies SI.   Past Psychiatric Medication Trials: Trintellix - Initially effective and then was not effective when re-started Paxil- Ineffective. Helped initially. Prozac-Insomnia Lexapro- Effective and then no longer as effective Zoloft- Initially effective and then not as effective.  Viibryd Cymbalta- Ineffective. Helped initially. Effexor XR- Effective, well tolerated. Wellbutrin XL-Ineffective.May have increased anxiety. Amitriptyline Abilify- Helpful but caused severe insomnia. Seroquel - Ineffective Risperdal Olanzapine- Ineffective Klonopin- Effective Temazepam- Took during menopause Lunesta- Ineffective Ambien-Ineffective Sonata- Ineffective Trazodone- Ineffective Remeron-Ineffective. Does not recall wt gain.  Adderall- Took once and had adverse effect. Concerta- Effective Lamictal- May have had an adverse effects. "Felt weird." Reports worsening s/s.  Lithium Gabapentin  Review of Systems:  Review of Systems  Musculoskeletal:  Negative for gait problem.  Neurological:  Negative for tremors.  Psychiatric/Behavioral:         Please refer to HPI   Medications: I have reviewed the patient's current medications.  Current Outpatient Medications  Medication Sig Dispense Refill   ARIPiprazole (ABILIFY) 2 MG tablet Take 1 tablet (2 mg total) by mouth daily. 90 tablet 0   clonazePAM (KLONOPIN) 0.5 MG tablet Take 1 tablet (0.5 mg total) by mouth at bedtime. 30 tablet 2   doxycycline (VIBRAMYCIN) 50 MG capsule Take 50 mg by mouth daily.     estradiol (ESTRACE VAGINAL) 0.1 MG/GM vaginal cream 1 gram pv twice weekly at bedtime 42.5 g 3   gabapentin (NEURONTIN) 100 MG capsule Take 1-2 capsules  as needed 180 capsule 1   gabapentin (NEURONTIN) 300 MG capsule Take 1 capsule (300 mg total) by mouth at bedtime. 90 capsule 1   ibuprofen (ADVIL,MOTRIN) 200  MG tablet Take 200 mg by mouth every 6 (six) hours as needed.     lithium carbonate 300 MG capsule Take 1 capsule (300 mg total) by mouth at bedtime. 90 capsule 1   MAGNESIUM PO Take by mouth.     methylphenidate (CONCERTA) 36 MG PO CR tablet Take 1 tablet (36 mg total) by mouth daily. 30 tablet 0   [START ON 05/19/2021] methylphenidate (CONCERTA) 36 MG PO CR tablet Take 1 tablet (36 mg total) by mouth daily. 30 tablet 0   [START ON 04/21/2021] methylphenidate (CONCERTA) 36 MG PO CR tablet Take 1 tablet (36 mg total) by mouth daily. 30 tablet 0   Multiple Vitamin (MULTIVITAMIN) tablet Take 1 tablet by mouth daily.     valACYclovir (VALTREX) 500 MG tablet Take one twice daily for 3 days with any symptoms 30 tablet 3   Vilazodone HCl (VIIBRYD) 40 MG TABS Take 1 tablet (40 mg total) by mouth every morning AND 0.5 tablets (20 mg total) daily after lunch. 135 tablet 0   No current facility-administered medications for this visit.    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   TRANSAMINASES, SERUM, ELEVATED 08/14/2009    Family History  Problem Relation Age of Onset   Cancer Mother 56       colon cancer   Hypertension Mother    Colon cancer Mother 58       colon rescection   Colon polyps Mother    Hypertension Father    Heart disease Father 41       CABG62   Stroke Father 59   Heart disease Sister        atrial fibrillation   Heart attack Sister 72       no stents   Depression Sister    Heart failure Paternal Aunt    Diabetes Paternal Uncle    Heart disease Paternal Grandfather    Esophageal  cancer Neg Hx    Rectal cancer Neg Hx    Stomach cancer Neg Hx     Social History   Socioeconomic History   Marital status: Married    Spouse name: [name]   Number of children: 3   Years of education: Master's   Highest education level: Not on file  Occupational History   Occupation: Optician, dispensing: [employer]  Tobacco Use   Smoking status: Former    Packs/day: 0.50    Years: 5.00    Pack years: 2.50    Types: Cigarettes    Quit date: 07/10/1987    Years since quitting: 33.7   Smokeless tobacco: Never  Vaping Use   Vaping Use: Never used  Substance and Sexual Activity   Alcohol use: No   Drug use: No   Sexual activity: Yes    Partners: Male  Birth control/protection: Post-menopausal, Other-see comments    Comment: vasectomy  Other Topics Concern   Not on file  Social History Narrative   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   Patient has three children.   Patient works at [employer].   Patient has a Master's degree.   Patient is left-handed.   Patient drinks 6 cups of coffee daily.   Social Determinants of Health   Financial Resource Strain: Not on file  Food Insecurity: Not on file  Transportation Needs: Not on file  Physical Activity: Not on file  Stress: Not on file  Social Connections: Not on file  Intimate Partner Violence: Not on file    Past Medical History, Surgical history, Social history, and Family history were reviewed and updated as appropriate.   Please see review of systems for further details on the patient's review from today.   Objective:   Physical Exam:  LMP 12/27/2007 (Exact Date)   Physical Exam Neurological:     Mental Status: She is alert and oriented to person, place, and time.     Cranial Nerves: No dysarthria.  Psychiatric:        Attention and Perception: Attention and perception normal.        Mood and Affect: Mood normal.        Speech: Speech  normal.        Behavior: Behavior is cooperative.        Thought Content: Thought content normal. Thought content is not paranoid or delusional. Thought content does not include homicidal or suicidal ideation. Thought content does not include homicidal or suicidal plan.        Cognition and Memory: Cognition and memory normal.        Judgment: Judgment normal.     Comments: Insight intact    Lab Review:     Component Value Date/Time   NA 137 11/28/2020 0733   K 4.0 11/28/2020 0733   CL 101 11/28/2020 0733   CO2 26 11/28/2020 0733   GLUCOSE 89 11/28/2020 0733   BUN 15 11/28/2020 0733   CREATININE 0.60 11/28/2020 0733   CREATININE 0.68 01/26/2020 0920   CALCIUM 9.8 11/28/2020 0733   PROT 6.9 11/28/2020 0733   ALBUMIN 4.6 11/28/2020 0733   AST 50 (H) 11/28/2020 0733   ALT 81 (H) 11/28/2020 0733   ALKPHOS 64 11/28/2020 0733   BILITOT 0.4 11/28/2020 0733   GFRNONAA >90 05/01/2014 1645   GFRAA >90 05/01/2014 1645       Component Value Date/Time   WBC 3.7 (L) 11/28/2020 0733   RBC 3.95 11/28/2020 0733   HGB 13.2 11/28/2020 0733   HGB 14.1 10/26/2014 1034   HCT 38.1 11/28/2020 0733   PLT 257.0 11/28/2020 0733   MCV 96.6 11/28/2020 0733   MCH 34.4 (H) 01/26/2020 0920   MCHC 34.6 11/28/2020 0733   RDW 12.4 11/28/2020 0733   LYMPHSABS 1.1 11/28/2020 0733   MONOABS 0.3 11/28/2020 0733   EOSABS 0.1 11/28/2020 0733   BASOSABS 0.1 11/28/2020 0733    No results found for: POCLITH, LITHIUM   No results found for: PHENYTOIN, PHENOBARB, VALPROATE, CBMZ   .res Assessment: Plan:   Pt seen for 28 minutes and time spent discussing treatment plan. Discussed that intermittent and early awakenings are a common side effect with Viibryd and often this side effect improves or resolves after a couple of weeks. Discussed that taking second dose of Viibryd earlier  may also help improve sleep disturbance. Discussed that it may be helpful to use Gabapentin 200 mg for middle of the night  awakenings since 100 mg has been only partially effective. She reports that Gabapentin 300 mg po QHS has been helpful for sleep initiation.  Continue Viibryd 40 mg po q am and 20 mg mid-day since this has been helpful for her mood and anxiety s/s.  Discussed considering reducing or discontinuing a medication at next visit to reduce polypharmacy once new baseline has been established after taking Viibryd for at least 4 weeks. Discussed considering discontinuation of either Abilify or Lithium.  Continue Abilify 2 mg po qd for depression. Continue Lithium 300 mg po QHS for depression.  Continue Concerta 36 mg po qd for ADHD.  Continue Klonopin 0.5 mg po QHS for insomnia and anxiety.  Pt to follow-up in 6 weeks or sooner if clinically indicated.  Patient advised to contact office with any questions, adverse effects, or acute worsening in signs and symptoms.   Yen was seen today for medication problem and follow-up.  Diagnoses and all orders for this visit:  Mild episode of recurrent major depressive disorder (HCC) -     Vilazodone HCl (VIIBRYD) 40 MG TABS; Take 1 tablet (40 mg total) by mouth every morning AND 0.5 tablets (20 mg total) daily after lunch. -     ARIPiprazole (ABILIFY) 2 MG tablet; Take 1 tablet (2 mg total) by mouth daily.  Anxiety -     Vilazodone HCl (VIIBRYD) 40 MG TABS; Take 1 tablet (40 mg total) by mouth every morning AND 0.5 tablets (20 mg total) daily after lunch.    Please see After Visit Summary for patient specific instructions.  Future Appointments  Date Time Provider [place]  04/12/2021 11:45 AM Dillingham, Loel Lofty, DO [facility] None  05/13/2021  9:00 AM [name], [facility] CP-CP None  06/04/2021  8:30 AM [name], PMHNP CP-CP None  12/16/2021  8:15 AM Megan Salon, MD [facility]    No orders of the defined types were placed in this encounter.     -------------------------------

## 2021-04-12 ENCOUNTER — Other Ambulatory Visit: Payer: Self-pay

## 2021-04-12 ENCOUNTER — Encounter: Payer: Self-pay | Admitting: Plastic Surgery

## 2021-04-12 ENCOUNTER — Ambulatory Visit (INDEPENDENT_AMBULATORY_CARE_PROVIDER_SITE_OTHER): Payer: Managed Care, Other (non HMO) | Admitting: Plastic Surgery

## 2021-04-12 DIAGNOSIS — Z719 Counseling, unspecified: Secondary | ICD-10-CM

## 2021-04-12 NOTE — Progress Notes (Signed)
Preoperative Dx: hyperpigmentation of face  Postoperative Dx:  same  Procedure: laser to face   Anesthesia: none  Description of Procedure:  Risks and complications were explained to the patient. Consent was confirmed and signed. Eye protection was placed. Time out was called and all information was confirmed to be correct. The area  area was prepped with alcohol and wiped dry. The 590 nm  BBL laser was set at 6 J/cm2. The face was lasered. The patient tolerated the procedure well and there were no complications. The patient is to follow up in 4 weeks.

## 2021-04-15 ENCOUNTER — Telehealth: Payer: Self-pay | Admitting: Psychiatry

## 2021-04-15 NOTE — Telephone Encounter (Signed)
Heather Davila Pharmacist, with [facility] called. They received a prescription on Janyth for generic Vybrid 1-1/2 per day. Per Ronalee Belts, this dose exceeds maximum of one daily. They need clarification before the can proceed to fill prescription. Pharmacy number is [id number]. Reference # is [id number]

## 2021-04-15 NOTE — Telephone Encounter (Signed)
See message from pharmacist. ? Needs a PA.

## 2021-04-17 NOTE — Telephone Encounter (Signed)
CC- can a pt take 60 mg Viibryd daily?

## 2021-04-17 NOTE — Telephone Encounter (Signed)
This is medically appropriate in this case

## 2021-04-18 NOTE — Telephone Encounter (Signed)
Noted thanks I will try to do a PA

## 2021-04-18 NOTE — Telephone Encounter (Signed)
Rtc to [facility] just to confirm dose of 40 mg take 1.5 tablets daily as well as confirming the Paroxetine was discontinued.   They will get her medication mailed out at this time.

## 2021-04-23 ENCOUNTER — Other Ambulatory Visit: Payer: Self-pay | Admitting: Psychiatry

## 2021-04-23 DIAGNOSIS — F330 Major depressive disorder, recurrent, mild: Secondary | ICD-10-CM

## 2021-04-23 DIAGNOSIS — F419 Anxiety disorder, unspecified: Secondary | ICD-10-CM

## 2021-04-30 MED ORDER — NORTRIPTYLINE HCL 25 MG PO CAPS: ORAL_CAPSULE | ORAL | 0 refills | Status: DC

## 2021-04-30 NOTE — Addendum Note (Signed)
Addended by: Sharyl Nimrod on: 04/30/2021 01:00 PM   Modules accepted: Orders

## 2021-05-10 ENCOUNTER — Encounter: Payer: Self-pay | Admitting: Psychiatry

## 2021-05-10 ENCOUNTER — Telehealth (INDEPENDENT_AMBULATORY_CARE_PROVIDER_SITE_OTHER): Payer: 59 | Admitting: Psychiatry

## 2021-05-10 ENCOUNTER — Telehealth: Payer: Self-pay

## 2021-05-10 DIAGNOSIS — F419 Anxiety disorder, unspecified: Secondary | ICD-10-CM | POA: Diagnosis not present

## 2021-05-10 DIAGNOSIS — F330 Major depressive disorder, recurrent, mild: Secondary | ICD-10-CM

## 2021-05-10 MED ORDER — VILAZODONE HCL 40 MG PO TABS: ORAL_TABLET | ORAL | 0 refills | Status: DC

## 2021-05-10 NOTE — Progress Notes (Signed)
Heather Davila [id number] 07-Nov-1960 61 y.o.  Virtual Visit via Video Note  I connected with pt @ on 05/10/21 at 11:30 AM EST by a video enabled telemedicine application and verified that I am speaking with the correct person using two identifiers.   I discussed the limitations of evaluation and management by telemedicine and the availability of in person appointments. The patient expressed understanding and agreed to proceed.  I discussed the assessment and treatment plan with the patient. The patient was provided an opportunity to ask questions and all were answered. The patient agreed with the plan and demonstrated an understanding of the instructions.   The patient was advised to call back or seek an in-person evaluation if the symptoms worsen or if the condition fails to improve as anticipated.  I provided 30 minutes of non-face-to-face time during this encounter.  The patient was located at home.  The provider was located at [place].   [name], PMHNP   Subjective:   Patient ID:  Heather Davila is a 61 y.o. (DOB 05-06-1960) female.  Chief Complaint:  Chief Complaint  Patient presents with   Depression    HPI Jahanna Raether presents for follow-up of depression, anxiety, and insomnia. She reports that she stopped Nortriptyline "because I did not like the way it made me feel. I was more irritable." She reports that Viibryd seemed to help "but it wears off." She reports that Viibryd 60 mg daily was not approved. She reports that she then tried Rexulti and Viibryd and "it worked great" but caused severe insomnia. Able to sleep with Abilify and viibryd. "I feel ok today because I took 60 mg of the Viibryd."  She reports that she has had some mornings where she awakens and is crying and this abates soon after taking Viibryd.  Energy and motivation have been lower with depression. She also experienced crying episodes with depression. Concentration is ok. Appetite has  been ok. Denies any recent anxiety.   Denies SI.   She feels that Abilify and Lithium are no longer effective. She would like to stop these medications.    Past Psychiatric Medication Trials: Trintellix - Initially effective and then was not effective when re-started Paxil- Ineffective. Helped initially. Prozac-Insomnia Lexapro- Effective and then no longer as effective Zoloft- Initially effective and then not as effective.  Viibryd Cymbalta- Ineffective. Helped initially. Effexor XR- Effective, well tolerated. Wellbutrin XL-Ineffective.May have increased anxiety. Amitriptyline Nortriptyline-Caused irritability, constipation Abilify- Helpful but caused severe insomnia. Rexulti-Helpful but caused insomnia. Seroquel - Ineffective Risperdal Olanzapine- Ineffective Klonopin- Effective Temazepam- Took during menopause Lunesta- Ineffective Ambien-Ineffective Sonata- Ineffective Trazodone- Ineffective Remeron-Ineffective. Does not recall wt gain.  Adderall- Took once and had adverse effect. Concerta- Effective Lamictal- May have had an adverse effects. "Felt weird." Reports worsening s/s.  Lithium Gabapentin    Review of Systems:  Review of Systems  Gastrointestinal: Negative.   Musculoskeletal:  Negative for gait problem.  Psychiatric/Behavioral:         Please refer to HPI   Medications: I have reviewed the patient's current medications.  Current Outpatient Medications  Medication Sig Dispense Refill   ARIPiprazole (ABILIFY) 2 MG tablet Take 1 tablet (2 mg total) by mouth daily. 90 tablet 0   doxycycline (VIBRAMYCIN) 50 MG capsule Take 50 mg by mouth daily.     estradiol (ESTRACE VAGINAL) 0.1 MG/GM vaginal cream 1 gram pv twice weekly at bedtime 42.5 g 3   gabapentin (NEURONTIN) 100 MG capsule Take 1-2 capsules as needed 180 capsule  1   gabapentin (NEURONTIN) 300 MG capsule Take 1 capsule (300 mg total) by mouth at bedtime. 90 capsule 1   ibuprofen (ADVIL,MOTRIN) 200  MG tablet Take 200 mg by mouth every 6 (six) hours as needed.     MAGNESIUM PO Take by mouth.     methylphenidate (CONCERTA) 36 MG PO CR tablet Take 1 tablet (36 mg total) by mouth daily. 30 tablet 0   Multiple Vitamin (MULTIVITAMIN) tablet Take 1 tablet by mouth daily.     valACYclovir (VALTREX) 500 MG tablet Take one twice daily for 3 days with any symptoms 30 tablet 3   clonazePAM (KLONOPIN) 0.5 MG tablet Take 1 tablet (0.5 mg total) by mouth at bedtime. 30 tablet 2   [START ON 05/19/2021] methylphenidate (CONCERTA) 36 MG PO CR tablet Take 1 tablet (36 mg total) by mouth daily. 30 tablet 0   methylphenidate (CONCERTA) 36 MG PO CR tablet Take 1 tablet (36 mg total) by mouth daily. 30 tablet 0   Vilazodone HCl (VIIBRYD) 40 MG TABS Take 1 tablet (40 mg total) by mouth every morning AND 0.5 tablets (20 mg total) daily after lunch. 135 tablet 0   No current facility-administered medications for this visit.    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   TRANSAMINASES, SERUM, ELEVATED 08/14/2009    Family History  Problem Relation Age of Onset   Cancer Mother 75       colon cancer   Hypertension Mother    Colon cancer Mother 84       colon rescection   Colon polyps Mother    Hypertension Father    Heart disease Father 73       CABG62   Stroke Father 18   Heart disease Sister        atrial fibrillation   Heart attack Sister 68       no stents   Depression Sister    Heart failure Paternal Aunt    Diabetes Paternal Uncle    Heart disease Paternal Grandfather    Esophageal cancer Neg Hx     Rectal cancer Neg Hx    Stomach cancer Neg Hx     Social History   Socioeconomic History   Marital status: Married    Spouse name: Charles   Number of children: 3   Years of education: Master's   Highest education level: Not on file  Occupational History   Occupation: Optician, dispensing: [employer]  Tobacco Use   Smoking status: Former    Packs/day: 0.50    Years: 5.00    Pack years: 2.50    Types: Cigarettes    Quit date: 07/10/1987    Years since quitting: 33.8   Smokeless tobacco: Never  Vaping Use   Vaping Use: Never used  Substance and Sexual Activity   Alcohol use: No   Drug use: No   Sexual activity: Yes    Partners: Male    Birth control/protection: Post-menopausal, Other-see comments  Comment: vasectomy  Other Topics Concern   Not on file  Social History Narrative   Patient is married [name]) and lives at home with her husband and two children.   Patient has three children.   Patient works at [employer].   Patient has a Master's degree.   Patient is left-handed.   Patient drinks 6 cups of coffee daily.   Social Determinants of Health   Financial Resource Strain: Not on file  Food Insecurity: Not on file  Transportation Needs: Not on file  Physical Activity: Not on file  Stress: Not on file  Social Connections: Not on file  Intimate Partner Violence: Not on file    Past Medical History, Surgical history, Social history, and Family history were reviewed and updated as appropriate.   Please see review of systems for further details on the patient's review from today.   Objective:   Physical Exam:  LMP 12/27/2007 (Exact Date)   Physical Exam Neurological:     Mental Status: She is alert and oriented to person, place, and time.     Cranial Nerves: No dysarthria.  Psychiatric:        Attention and Perception: Attention and perception normal.        Mood and Affect: Mood is depressed.        Speech: Speech normal.         Behavior: Behavior is cooperative.        Thought Content: Thought content normal. Thought content is not paranoid or delusional. Thought content does not include homicidal or suicidal ideation. Thought content does not include homicidal or suicidal plan.        Cognition and Memory: Cognition and memory normal.        Judgment: Judgment normal.     Comments: Insight intact    Lab Review:     Component Value Date/Time   NA 137 11/28/2020 0733   K 4.0 11/28/2020 0733   CL 101 11/28/2020 0733   CO2 26 11/28/2020 0733   GLUCOSE 89 11/28/2020 0733   BUN 15 11/28/2020 0733   CREATININE 0.60 11/28/2020 0733   CREATININE 0.68 01/26/2020 0920   CALCIUM 9.8 11/28/2020 0733   PROT 6.9 11/28/2020 0733   ALBUMIN 4.6 11/28/2020 0733   AST 50 (H) 11/28/2020 0733   ALT 81 (H) 11/28/2020 0733   ALKPHOS 64 11/28/2020 0733   BILITOT 0.4 11/28/2020 0733   GFRNONAA >90 05/01/2014 1645   GFRAA >90 05/01/2014 1645       Component Value Date/Time   WBC 3.7 (L) 11/28/2020 0733   RBC 3.95 11/28/2020 0733   HGB 13.2 11/28/2020 0733   HGB 14.1 10/26/2014 1034   HCT 38.1 11/28/2020 0733   PLT 257.0 11/28/2020 0733   MCV 96.6 11/28/2020 0733   MCH 34.4 (H) 01/26/2020 0920   MCHC 34.6 11/28/2020 0733   RDW 12.4 11/28/2020 0733   LYMPHSABS 1.1 11/28/2020 0733   MONOABS 0.3 11/28/2020 0733   EOSABS 0.1 11/28/2020 0733   BASOSABS 0.1 11/28/2020 0733    No results found for: POCLITH, LITHIUM   No results found for: PHENYTOIN, PHENOBARB, VALPROATE, CBMZ   .res Assessment: Plan:    Patient seen for 30 minutes and time spent discussing treatment plan.  She reports that Viibryd 60 mg daily seems to be effective for depression, however her understanding is that insurance did not approve Viibryd at this dose.  We will resubmit prescription and seek approval for Viibryd 60 mg daily  since she reports improved depression signs and symptoms with Viibryd 60 mg daily and has experienced recurrence of  depression when dose was reduced. Will discontinue lithium since she feels that lithium is no longer effective for her depressive signs and symptoms. She reports that she is also interested in discontinuing Abilify since she is unsure of benefit.  Recommend initially discontinuing lithium and then can try to reduce Abilify after response to discontinuation and lithium is known.  Continue Klonopin 0.5 mg at bedtime for anxiety and insomnia. Continue Concerta 36 mg in the morning for attention deficit disorder. Patient to follow-up with this provider in 3 weeks or sooner if clinically indicated. Patient advised to contact office with any questions, adverse effects, or acute worsening in signs and symptoms.   Annleigh was seen today for depression.  Diagnoses and all orders for this visit:  Mild episode of recurrent major depressive disorder (HCC) -     Vilazodone HCl (VIIBRYD) 40 MG TABS; Take 1 tablet (40 mg total) by mouth every morning AND 0.5 tablets (20 mg total) daily after lunch.  Anxiety -     Vilazodone HCl (VIIBRYD) 40 MG TABS; Take 1 tablet (40 mg total) by mouth every morning AND 0.5 tablets (20 mg total) daily after lunch.     Please see After Visit Summary for patient specific instructions.  Future Appointments  Date Time Provider [place]  05/13/2021  9:00 AM [name], [facility] None  05/14/2021  3:30 PM Dillingham, Loel Lofty, DO [facility] None  06/04/2021  8:30 AM [name], PMHNP CP[facility]CP None  12/16/2021  8:15 AM Megan Salon, MD [facility]    No orders of the defined types were placed in this encounter.     -------------------------------

## 2021-05-13 ENCOUNTER — Ambulatory Visit: Payer: 59 | Admitting: Psychiatry

## 2021-05-14 ENCOUNTER — Encounter: Payer: Self-pay | Admitting: Plastic Surgery

## 2021-05-14 ENCOUNTER — Ambulatory Visit (INDEPENDENT_AMBULATORY_CARE_PROVIDER_SITE_OTHER): Payer: Self-pay | Admitting: Plastic Surgery

## 2021-05-14 ENCOUNTER — Other Ambulatory Visit: Payer: Self-pay

## 2021-05-14 DIAGNOSIS — Z719 Counseling, unspecified: Secondary | ICD-10-CM

## 2021-05-14 NOTE — Progress Notes (Signed)
[place] Preoperative Dx: face scar  Postoperative Dx:  same  Procedure: laser to face   Anesthesia: none  Description of Procedure:  Risks and complications were explained to the patient. Consent was confirmed and signed. Eye protection was placed. Time out was called and all information was confirmed to be correct. The area  area was prepped with alcohol and wiped dry. The BBL laser was set at 560 nm with 5 and 6 J/cm2. The face was lasered. The patient tolerated the procedure well and there were no complications. The patient is to follow up in 4 weeks.

## 2021-05-27 MED ORDER — BREXPIPRAZOLE 1 MG PO TABS: 1.0000 mg | ORAL_TABLET | Freq: Every day | ORAL | 1 refills | Status: DC

## 2021-05-29 ENCOUNTER — Telehealth (INDEPENDENT_AMBULATORY_CARE_PROVIDER_SITE_OTHER): Payer: 59 | Admitting: Psychiatry

## 2021-05-29 ENCOUNTER — Encounter: Payer: Self-pay | Admitting: Psychiatry

## 2021-05-29 DIAGNOSIS — F419 Anxiety disorder, unspecified: Secondary | ICD-10-CM

## 2021-05-29 DIAGNOSIS — F332 Major depressive disorder, recurrent severe without psychotic features: Secondary | ICD-10-CM

## 2021-05-29 DIAGNOSIS — F330 Major depressive disorder, recurrent, mild: Secondary | ICD-10-CM

## 2021-05-29 DIAGNOSIS — F900 Attention-deficit hyperactivity disorder, predominantly inattentive type: Secondary | ICD-10-CM

## 2021-05-29 DIAGNOSIS — G4700 Insomnia, unspecified: Secondary | ICD-10-CM

## 2021-05-29 MED ORDER — METHYLPHENIDATE HCL ER (OSM) 36 MG PO TBCR: 36.0000 mg | EXTENDED_RELEASE_TABLET | Freq: Every day | ORAL | 0 refills | Status: DC

## 2021-05-29 NOTE — Progress Notes (Signed)
Heather Davila [id number] 05-14-1960 61 y.o.  Virtual Visit via Video Note  I connected with pt @ on 05/29/21 at 11:00 AM EST by a video enabled telemedicine application and verified that I am speaking with the correct person using two identifiers.   I discussed the limitations of evaluation and management by telemedicine and the availability of in person appointments. The patient expressed understanding and agreed to proceed.  I discussed the assessment and treatment plan with the patient. The patient was provided an opportunity to ask questions and all were answered. The patient agreed with the plan and demonstrated an understanding of the instructions.   The patient was advised to call back or seek an in-person evaluation if the symptoms worsen or if the condition fails to improve as anticipated.  I provided 25 minutes of non-face-to-face time during this encounter.  The patient was located at home.  The provider was located at [place].   [name], PMHNP   Subjective:   Patient ID:  Heather Davila is a 61 y.o. (DOB 07-May-1960) female.  Chief Complaint:  Chief Complaint  Patient presents with   Depression    HPI Heather Davila presents for follow-up of depression, anxiety, and insomnia. She reports that she has had increased depression. She reports that Viibryd seems to wear off quickly. She went back on Rexulti about 2 weeks ago and noticed some improvement and thinks that she may need to increase to 2 mg daily. She tried taking Rexulti 2 mg today. She has continued Lithium and stopped Abilify. Sleep has been "ok." She reports that she has had some anxiety. Energy and motivation have been low. Limited enjoyment in things. Concentration has been "ok." No change in appetite. Denies SI.   Son has had some health issues. Husband is doing ok. Work is going ok.   Past Psychiatric Medication Trials: Trintellix - Initially effective and then was not effective when  re-started Paxil- Ineffective. Helped initially. Prozac-Insomnia Lexapro- Effective and then no longer as effective Zoloft- Initially effective and then not as effective.  Viibryd Cymbalta- Ineffective. Helped initially. Effexor XR- Effective, well tolerated. Wellbutrin XL-Ineffective.May have increased anxiety. Amitriptyline Nortriptyline-Caused irritability, constipation Abilify- Helpful but caused severe insomnia. Rexulti-Helpful but caused insomnia. Seroquel - Ineffective Risperdal Olanzapine- Ineffective Klonopin- Effective Temazepam- Took during menopause Lunesta- Ineffective Ambien-Ineffective Sonata- Ineffective Trazodone- Ineffective Remeron-Ineffective. Does not recall wt gain.  Adderall- Took once and had adverse effect. Concerta- Effective Lamictal- May have had an adverse effects. "Felt weird." Reports worsening s/s.  Lithium Gabapentin    Review of Systems:  Review of Systems  Gastrointestinal: Negative.   Musculoskeletal:  Negative for gait problem.  Neurological:  Negative for tremors and headaches.  Psychiatric/Behavioral:         Please refer to HPI   Medications: I have reviewed the patient's current medications.  Current Outpatient Medications  Medication Sig Dispense Refill   brexpiprazole (REXULTI) 1 MG TABS tablet Take 1 tablet (1 mg total) by mouth daily. 30 tablet 1   doxycycline (VIBRAMYCIN) 50 MG capsule Take 50 mg by mouth daily.     estradiol (ESTRACE VAGINAL) 0.1 MG/GM vaginal cream 1 gram pv twice weekly at bedtime 42.5 g 3   gabapentin (NEURONTIN) 100 MG capsule Take 1-2 capsules as needed 180 capsule 1   gabapentin (NEURONTIN) 300 MG capsule Take 1 capsule (300 mg total) by mouth at bedtime. 90 capsule 1   ibuprofen (ADVIL,MOTRIN) 200 MG tablet Take 200 mg by mouth every 6 (six) hours as  needed.     Multiple Vitamin (MULTIVITAMIN) tablet Take 1 tablet by mouth daily.     TURMERIC PO Take by mouth.     valACYclovir (VALTREX) 500 MG  tablet Take one twice daily for 3 days with any symptoms 30 tablet 3   Vilazodone HCl (VIIBRYD) 40 MG TABS Take 1 tablet (40 mg total) by mouth every morning AND 0.5 tablets (20 mg total) daily after lunch. 135 tablet 0   clonazePAM (KLONOPIN) 0.5 MG tablet Take 1 tablet (0.5 mg total) by mouth at bedtime. 30 tablet 2   MAGNESIUM PO Take by mouth.     methylphenidate (CONCERTA) 36 MG PO CR tablet Take 1 tablet (36 mg total) by mouth daily. 30 tablet 0   methylphenidate (CONCERTA) 36 MG PO CR tablet Take 1 tablet (36 mg total) by mouth daily. 30 tablet 0   [START ON 07/02/2021] methylphenidate (CONCERTA) 36 MG PO CR tablet Take 1 tablet (36 mg total) by mouth daily. 30 tablet 0   No current facility-administered medications for this visit.    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   TRANSAMINASES, SERUM, ELEVATED 08/14/2009    Family History  Problem Relation Age of Onset   Cancer Mother 27       colon cancer   Hypertension Mother    Colon cancer Mother 77       colon rescection   Colon polyps Mother    Hypertension Father    Heart disease Father 55       CABG62   Stroke Father 58   Heart disease Sister        atrial fibrillation   Heart attack Sister 54       no stents   Depression Sister    Heart failure Paternal Aunt    Diabetes Paternal Uncle    Heart disease Paternal Grandfather    Esophageal cancer Neg Hx    Rectal cancer Neg Hx    Stomach cancer Neg Hx     Social History   Socioeconomic History   Marital status: Married    Spouse name: Charles   Number of children: 3   Years of  education: Master's   Highest education level: Not on file  Occupational History   Occupation: Optician, dispensing: [employer]  Tobacco Use   Smoking status: Former    Packs/day: 0.50    Years: 5.00    Pack years: 2.50    Types: Cigarettes    Quit date: 07/10/1987    Years since quitting: 33.9   Smokeless tobacco: Never  Vaping Use   Vaping Use: Never used  Substance and Sexual Activity   Alcohol use: No   Drug use: No   Sexual activity: Yes    Partners: Male    Birth control/protection: Post-menopausal, Other-see comments    Comment: vasectomy  Other Topics Concern   Not on file  Social History Narrative   Patient is married Tax inspector) and lives at home with her husband and  two children.   Patient has three children.   Patient works at [employer].   Patient has a Master's degree.   Patient is left-handed.   Patient drinks 6 cups of coffee daily.   Social Determinants of Health   Financial Resource Strain: Not on file  Food Insecurity: Not on file  Transportation Needs: Not on file  Physical Activity: Not on file  Stress: Not on file  Social Connections: Not on file  Intimate Partner Violence: Not on file    Past Medical History, Surgical history, Social history, and Family history were reviewed and updated as appropriate.   Please see review of systems for further details on the patient's review from today.   Objective:   Physical Exam:  LMP 12/27/2007 (Exact Date)   Physical Exam Neurological:     Mental Status: She is alert and oriented to person, place, and time.     Cranial Nerves: No dysarthria.  Psychiatric:        Attention and Perception: Attention and perception normal.        Mood and Affect: Mood is depressed.        Speech: Speech normal.        Behavior: Behavior is cooperative.        Thought Content: Thought content normal. Thought content is not paranoid or delusional. Thought content does not include homicidal or  suicidal ideation. Thought content does not include homicidal or suicidal plan.        Cognition and Memory: Cognition and memory normal.        Judgment: Judgment normal.     Comments: Insight intact    Lab Review:     Component Value Date/Time   NA 137 11/28/2020 0733   K 4.0 11/28/2020 0733   CL 101 11/28/2020 0733   CO2 26 11/28/2020 0733   GLUCOSE 89 11/28/2020 0733   BUN 15 11/28/2020 0733   CREATININE 0.60 11/28/2020 0733   CREATININE 0.68 01/26/2020 0920   CALCIUM 9.8 11/28/2020 0733   PROT 6.9 11/28/2020 0733   ALBUMIN 4.6 11/28/2020 0733   AST 50 (H) 11/28/2020 0733   ALT 81 (H) 11/28/2020 0733   ALKPHOS 64 11/28/2020 0733   BILITOT 0.4 11/28/2020 0733   GFRNONAA >90 05/01/2014 1645   GFRAA >90 05/01/2014 1645       Component Value Date/Time   WBC 3.7 (L) 11/28/2020 0733   RBC 3.95 11/28/2020 0733   HGB 13.2 11/28/2020 0733   HGB 14.1 10/26/2014 1034   HCT 38.1 11/28/2020 0733   PLT 257.0 11/28/2020 0733   MCV 96.6 11/28/2020 0733   MCH 34.4 (H) 01/26/2020 0920   MCHC 34.6 11/28/2020 0733   RDW 12.4 11/28/2020 0733   LYMPHSABS 1.1 11/28/2020 0733   MONOABS 0.3 11/28/2020 0733   EOSABS 0.1 11/28/2020 0733   BASOSABS 0.1 11/28/2020 0733    No results found for: POCLITH, LITHIUM   No results found for: PHENYTOIN, PHENOBARB, VALPROATE, CBMZ   .res Assessment: Plan:   Pt seen for 25 minutes and time spent discussing treatment options for depression. She reports that she would like to try an increase of Rexulti to 2 mg po qd using her current supply of 1 mg tabs. She will contact provider if this seems to be effective and she would like script sent for the 2 mg tabs.  Provider sent message to nurse to inquire about status of insurance approval of Viibryd 60 mg daily since pt reported improved  depressive s/s at 60 mg dose and had worsening depression when she backed dose down to 40 mg.  Discussed Pristiq as a possible alternative to Viibryd if depressive s/s  do not improve. Continue Klonopin 0.5 mg po QHS for insomnia.  Continue Concerta 36 mg po q am for ADHD.  Continue Gabapentin 300 mg po QHS for insomnia and anxiety.  Continue Gabapentin 100 mg 1-2 capsules as needed for middle of the night awakenings.  Pt has apt to start therapy with [name], [facility].  Pt to follow-up in 4-6 weeks or sooner if clinically indicated.  Patient advised to contact office with any questions, adverse effects, or acute worsening in signs and symptoms.   Leonor was seen today for depression.  Diagnoses and all orders for this visit:  Severe episode of recurrent major depressive disorder, without psychotic features ([place])  Attention deficit hyperactivity disorder (ADHD), predominantly inattentive type -     methylphenidate (CONCERTA) 36 MG PO CR tablet; Take 1 tablet (36 mg total) by mouth daily.  Insomnia, unspecified type     Please see After Visit Summary for patient specific instructions.  Future Appointments  Date Time Provider [place]  06/04/2021  8:30 AM [name], PMHNP CP-CP None  07/02/2021 10:00 AM [name], [facility] CP[facility]CP None  07/05/2021 11:45 AM [place], Loel Lofty, DO PSS[facility]PSS None  12/16/2021  8:15 AM Megan Salon, MD [facility]    No orders of the defined types were placed in this encounter.     -------------------------------

## 2021-06-04 ENCOUNTER — Ambulatory Visit: Payer: 59 | Admitting: Psychiatry

## 2021-06-10 MED ORDER — METHYLPHENIDATE HCL ER (OSM) 36 MG PO TBCR: 36.0000 mg | EXTENDED_RELEASE_TABLET | Freq: Every day | ORAL | 0 refills | Status: DC

## 2021-06-10 MED ORDER — BREXPIPRAZOLE 2 MG PO TABS: 2.0000 mg | ORAL_TABLET | Freq: Every day | ORAL | 1 refills | Status: DC

## 2021-06-11 MED ORDER — METHYLPHENIDATE HCL ER (CD) 30 MG PO CPCR: 30.0000 mg | ORAL_CAPSULE | ORAL | 0 refills | Status: DC

## 2021-06-11 MED ORDER — BREXPIPRAZOLE 2 MG PO TABS: 2.0000 mg | ORAL_TABLET | Freq: Every day | ORAL | 0 refills | Status: DC

## 2021-06-11 MED ORDER — GABAPENTIN 300 MG PO CAPS: 300.0000 mg | ORAL_CAPSULE | Freq: Every day | ORAL | 1 refills | Status: DC

## 2021-06-11 MED ORDER — METHYLPHENIDATE HCL ER (CD) 20 MG PO CPCR: 20.0000 mg | ORAL_CAPSULE | ORAL | 0 refills | Status: DC

## 2021-06-11 NOTE — Addendum Note (Signed)
Addended by: Sharyl Nimrod on: 06/11/2021 04:52 PM   Modules accepted: Orders

## 2021-06-11 NOTE — Addendum Note (Signed)
Addended by: Sharyl Nimrod on: 06/11/2021 03:17 PM   Modules accepted: Orders

## 2021-06-12 ENCOUNTER — Telehealth: Payer: Self-pay

## 2021-06-12 NOTE — Telephone Encounter (Signed)
Prior Authorization completed over the phone with [employer] for VILAZODONE 40 MG 1.5 TABLETS DAILY #45/30 DAY, approval received effective 06/12/2021-06/12/2022, PA# [id number]   [employer] (800) [phone]

## 2021-06-18 ENCOUNTER — Telehealth: Payer: 59 | Admitting: Psychiatry

## 2021-07-02 ENCOUNTER — Ambulatory Visit: Payer: 59 | Admitting: Psychiatry

## 2021-07-02 DIAGNOSIS — F330 Major depressive disorder, recurrent, mild: Secondary | ICD-10-CM
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
## 2021-07-05 ENCOUNTER — Ambulatory Visit (INDEPENDENT_AMBULATORY_CARE_PROVIDER_SITE_OTHER): Payer: Self-pay | Admitting: Plastic Surgery

## 2021-07-05 ENCOUNTER — Encounter: Payer: Self-pay | Admitting: Plastic Surgery

## 2021-07-05 ENCOUNTER — Other Ambulatory Visit: Payer: Self-pay

## 2021-07-05 DIAGNOSIS — Z719 Counseling, unspecified: Secondary | ICD-10-CM

## 2021-07-05 NOTE — Progress Notes (Signed)
[place] ? ?Preoperative Dx: hyperpigmentation of face ? ?Postoperative Dx:  same ? ?Procedure: laser to face  ? ?Anesthesia: none ? ?Description of Procedure:  ?Risks and complications were explained to the patient. Consent was confirmed and signed. Eye protection was placed. Time out was called and all information was confirmed to be correct. The area  area was prepped with alcohol and wiped dry. The BBL laser was set at 590 nm 7 J/cm2 and the 515 nm at 6 J. The face was lasered. The patient tolerated the procedure well and there were no complications. The patient is to follow up in 4 weeks. ? ? ?

## 2021-07-09 ENCOUNTER — Telehealth (INDEPENDENT_AMBULATORY_CARE_PROVIDER_SITE_OTHER): Payer: 59 | Admitting: Psychiatry

## 2021-07-09 ENCOUNTER — Encounter: Payer: Self-pay | Admitting: Psychiatry

## 2021-07-09 DIAGNOSIS — F900 Attention-deficit hyperactivity disorder, predominantly inattentive type: Secondary | ICD-10-CM

## 2021-07-09 DIAGNOSIS — F332 Major depressive disorder, recurrent severe without psychotic features: Secondary | ICD-10-CM | POA: Diagnosis not present

## 2021-07-09 MED ORDER — METHYLPHENIDATE HCL ER (OSM) 36 MG PO TBCR: 36.0000 mg | EXTENDED_RELEASE_TABLET | Freq: Every day | ORAL | 0 refills | Status: DC

## 2021-07-09 MED ORDER — AUVELITY 45-105 MG PO TBCR: 1.0000 | EXTENDED_RELEASE_TABLET | Freq: Two times a day (BID) | ORAL | 1 refills | Status: DC

## 2021-07-09 NOTE — Progress Notes (Signed)
Heather Davila ?[id number] ?[birth year] ?61 y.o. ? ?Virtual Visit via Video Note ? ?I connected with pt @ on 07/09/21 at  9:00 AM EDT by a video enabled telemedicine application and verified that I am speaking with the correct person using two identifiers. ?  ?I discussed the limitations of evaluation and management by telemedicine and the availability of in person appointments. The patient expressed understanding and agreed to proceed. ? ?I discussed the assessment and treatment plan with the patient. The patient was provided an opportunity to ask questions and all were answered. The patient agreed with the plan and demonstrated an understanding of the instructions. ?  ?The patient was advised to call back or seek an in-person evaluation if the symptoms worsen or if the condition fails to improve as anticipated. ? ?I provided 25 minutes of non-face-to-face time during this encounter.  The patient was located at work.  The provider was located at [place]. ? ? ?[name], PMHNP  ? ?Subjective:  ? ?Patient ID:  Heather Davila is a 61 y.o. (DOB [birth year]) female. ? ?Chief Complaint:  ?Chief Complaint  ?Patient presents with  ? Depression  ? ? ?HPI ?Heather Davila presents for follow-up of depression ?"I don't think the medicine is working." She reports, "I need pep in my step and I need to smile without forcing it." Energy and motivation have been low. She reports that she has had persistent sad mood. Some irritability. Denies anxiety. She reports that sleep is ok with Gabapentin. Appetite has been good. Concentration is improved with Concerta. Diminished enjoyment and interest in things. Denies SI.  ? ?Past Psychiatric Medication Trials: ?Trintellix - Initially effective and then was not effective when re-started ?Paxil- Ineffective. Helped initially. ?Prozac-Insomnia ?Lexapro- Effective and then no longer as effective ?Zoloft- Initially effective and then not as effective.  ?Viibryd ?Cymbalta-  Ineffective. Helped initially. ?Effexor XR- Effective, well tolerated. ?Wellbutrin XL-Ineffective.May have increased anxiety. ?Amitriptyline ?Nortriptyline-Caused irritability, constipation ?Abilify- Helpful but caused severe insomnia. ?Rexulti-Helpful but caused insomnia. ?Seroquel - Ineffective ?Risperdal ?Olanzapine- Ineffective ?Klonopin- Effective ?Temazepam- Took during menopause ?Lunesta- Ineffective ?Ambien-Ineffective ?Sonata- Ineffective ?Trazodone- Ineffective ?Remeron-Ineffective. Does not recall wt gain.  ?Adderall- Took once and had adverse effect. ?Concerta- Effective ?Metadate-Not as effective as Concerta ?Lamictal- May have had an adverse effects. "Felt weird." Reports worsening s/s.  ?Lithium ?Gabapentin ? ?Review of Systems:  ?Review of Systems  ?Musculoskeletal:  Negative for gait problem.  ?Neurological:  Negative for tremors.  ?Psychiatric/Behavioral:    ?     Please refer to HPI  ? ?Medications: I have reviewed the patient's current medications. ? ?Current Outpatient Medications  ?Medication Sig Dispense Refill  ? brexpiprazole (REXULTI) 2 MG TABS tablet Take 1 tablet (2 mg total) by mouth daily. 90 tablet 0  ? Dextromethorphan-buPROPion ER (AUVELITY) 45-105 MG TBCR Take 1 tablet by mouth 2 (two) times daily. 60 tablet 1  ? doxycycline (VIBRAMYCIN) 50 MG capsule Take 50 mg by mouth daily.    ? estradiol (ESTRACE VAGINAL) 0.1 MG/GM vaginal cream 1 gram pv twice weekly at bedtime 42.5 g 3  ? gabapentin (NEURONTIN) 100 MG capsule Take 1-2 capsules as needed 180 capsule 1  ? gabapentin (NEURONTIN) 300 MG capsule Take 1 capsule (300 mg total) by mouth at bedtime. 90 capsule 1  ? Multiple Vitamin (MULTIVITAMIN) tablet Take 1 tablet by mouth daily.    ? TURMERIC PO Take by mouth.    ? Vilazodone HCl (VIIBRYD) 40 MG TABS Take 1 tablet (40 mg total) by mouth  every morning AND 0.5 tablets (20 mg total) daily after lunch. 135 tablet 0  ? clonazePAM (KLONOPIN) 0.5 MG tablet Take 1 tablet (0.5 mg total) by  mouth at bedtime. 30 tablet 2  ? ibuprofen (ADVIL,MOTRIN) 200 MG tablet Take 200 mg by mouth every 6 (six) hours as needed.    ? MAGNESIUM PO Take by mouth. (Patient not taking: Reported on 07/09/2021)    ? methylphenidate (CONCERTA) 36 MG PO CR tablet Take 1 tablet (36 mg total) by mouth daily. (Patient not taking: Reported on 07/09/2021) 30 tablet 0  ? [START ON 07/11/2021] methylphenidate (CONCERTA) 36 MG PO CR tablet Take 1 tablet (36 mg total) by mouth daily. 30 tablet 0  ? methylphenidate (METADATE CD) 30 MG CR capsule Take 1 capsule (30 mg total) by mouth every morning. (Patient not taking: Reported on 07/09/2021) 30 capsule 0  ? valACYclovir (VALTREX) 500 MG tablet Take one twice daily for 3 days with any symptoms 30 tablet 3  ? ?No current facility-administered medications for this visit.  ? ? ?Medication Side Effects: None ? ?Allergies:  ?Allergies  ?Allergen Reactions  ? Ciprofloxacin   ?  REACTION: hives  ? Oxycodone-Acetaminophen   ?  REACTION: hives  ? ? ?Past Medical History:  ?Diagnosis Date  ? Adult acne   ? Anemia   ? Anorexia nervosa teen  ? DEPRESSION 08/09/2009  ? Hematoma 09/2020  ? post op after face lift  ? Insomnia   ? STD (sexually transmitted disease) 08/05/2013  ? 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  ? TRANSAMINASES, SERUM, ELEVATED 08/14/2009  ? ? ?Family History  ?Problem Relation Age of Onset  ? Cancer Mother 70  ?     colon cancer  ? Hypertension Mother   ? Colon cancer Mother 57  ?     colon rescection  ? Colon polyps Mother   ? Hypertension Father   ? Heart disease Father 63  ?     CABG62  ? Stroke Father 65  ? Heart disease Sister   ?     atrial fibrillation  ? Heart attack Sister 64  ?     no stents  ? Depression Sister   ? Heart failure Paternal Aunt   ? Diabetes Paternal Uncle   ? Heart disease Paternal Grandfather   ? Esophageal cancer Neg Hx   ? Rectal cancer Neg Hx   ? Stomach cancer Neg Hx   ? ? ?Social History  ? ?Socioeconomic History  ? Marital status: Married  ?  Spouse  name: [name]  ? Number of children: 3  ? Years of education: Master's  ? Highest education level: Not on file  ?Occupational History  ? Occupation: Nurse  ?  Employer: [employer]  ?Tobacco Use  ? Smoking status: Former  ?  Packs/day: 0.50  ?  Years: 5.00  ?  Pack years: 2.50  ?  Types: Cigarettes  ?  Quit date: 07/10/1987  ?  Years since quitting: 34.0  ? Smokeless tobacco: Never  ?Vaping Use  ? Vaping Use: Never used  ?Substance and Sexual Activity  ? Alcohol use: No  ? Drug use: No  ? Sexual activity: Yes  ?  Partners: Male  ?  Birth control/protection: Post-menopausal, Other-see comments  ?  Comment: vasectomy  ?Other Topics Concern  ? Not on file  ?Social History Narrative  ? 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  ?  Patient has three children.  ? Patient works at [employer].  ? Patient has a Master's degree.  ? Patient is left-handed.  ? Patient drinks 6 cups of coffee daily.  ? ?Social Determinants of Health  ? ?Financial Resource Strain: Not on file  ?Food Insecurity: Not on file  ?Transportation Needs: Not on file  ?Physical Activity: Not on file  ?Stress: Not on file  ?Social Connections: Not on file  ?Intimate Partner Violence: Not on file  ? ? ?Past Medical History, Surgical history, Social history, and Family history were reviewed and updated as appropriate.  ? ?Please see review of systems for further details on the patient's review from today.  ? ?Objective:  ? ?Physical Exam:  ?LMP 12/27/2007 (Exact Date)  ? ?Physical Exam ?Neurological:  ?   Mental Status: She is alert and oriented to person, place, and time.  ?   Cranial Nerves: No dysarthria.  ?Psychiatric:     ?   Attention and Perception: Attention and perception normal.     ?   Mood and Affect: Mood is depressed.     ?   Speech: Speech normal.     ?   Behavior: Behavior is cooperative.     ?   Thought Content: Thought content normal. Thought content is not paranoid or  delusional. Thought content does not include homicidal or suicidal ideation. Thought content does not include homicidal or suicidal plan.     ?   Cognition and Memory: Cognition and memory normal.     ?   Judgment: Judgme

## 2021-07-10 ENCOUNTER — Ambulatory Visit: Payer: 59 | Admitting: Psychiatry

## 2021-07-10 ENCOUNTER — Telehealth: Payer: Self-pay

## 2021-07-12 MED ORDER — METHYLPHENIDATE HCL ER (OSM) 36 MG PO TBCR: 36.0000 mg | EXTENDED_RELEASE_TABLET | Freq: Every day | ORAL | 0 refills | Status: DC

## 2021-07-12 NOTE — Addendum Note (Signed)
Addended by: Sharyl Nimrod on: 07/12/2021 02:48 PM ? ? Modules accepted: Orders ? ?

## 2021-07-15 MED ORDER — BREXPIPRAZOLE 2 MG PO TABS: ORAL_TABLET | ORAL | 0 refills | Status: DC

## 2021-07-15 NOTE — Addendum Note (Signed)
Addended by: Sharyl Nimrod on: 07/15/2021 11:00 AM ? ? Modules accepted: Orders ? ?

## 2021-08-05 ENCOUNTER — Other Ambulatory Visit: Payer: Self-pay | Admitting: Psychiatry

## 2021-08-05 DIAGNOSIS — F332 Major depressive disorder, recurrent severe without psychotic features: Secondary | ICD-10-CM

## 2021-08-06 ENCOUNTER — Encounter: Payer: Self-pay | Admitting: Psychiatry

## 2021-08-06 ENCOUNTER — Telehealth (INDEPENDENT_AMBULATORY_CARE_PROVIDER_SITE_OTHER): Payer: 59 | Admitting: Psychiatry

## 2021-08-06 DIAGNOSIS — G4700 Insomnia, unspecified: Secondary | ICD-10-CM

## 2021-08-06 DIAGNOSIS — F331 Major depressive disorder, recurrent, moderate: Secondary | ICD-10-CM

## 2021-08-06 DIAGNOSIS — F900 Attention-deficit hyperactivity disorder, predominantly inattentive type: Secondary | ICD-10-CM | POA: Diagnosis not present

## 2021-08-06 DIAGNOSIS — F419 Anxiety disorder, unspecified: Secondary | ICD-10-CM | POA: Diagnosis not present

## 2021-08-06 MED ORDER — AUVELITY 45-105 MG PO TBCR: 1.0000 | EXTENDED_RELEASE_TABLET | Freq: Two times a day (BID) | ORAL | 1 refills | Status: DC

## 2021-08-06 MED ORDER — BREXPIPRAZOLE 1 MG PO TABS: ORAL_TABLET | ORAL | 0 refills | Status: DC

## 2021-08-06 MED ORDER — METHYLPHENIDATE HCL ER (OSM) 36 MG PO TBCR: 36.0000 mg | EXTENDED_RELEASE_TABLET | Freq: Every day | ORAL | 0 refills | Status: DC

## 2021-08-06 MED ORDER — CLONAZEPAM 0.25 MG PO TBDP: ORAL_TABLET | ORAL | 2 refills | Status: DC

## 2021-08-06 MED ORDER — GABAPENTIN 100 MG PO CAPS: ORAL_CAPSULE | ORAL | 1 refills | Status: DC

## 2021-08-06 NOTE — Progress Notes (Signed)
Heather Davila ?[id number] ?Jan 20, 1961 ?61 y.o. ? ?Virtual Visit via Video Note ? ?I connected with pt @ on 08/06/21 at  8:30 AM EDT by a video enabled telemedicine application and verified that I am speaking with the correct person using two identifiers. ?  ?I discussed the limitations of evaluation and management by telemedicine and the availability of in person appointments. The patient expressed understanding and agreed to proceed. ? ?I discussed the assessment and treatment plan with the patient. The patient was provided an opportunity to ask questions and all were answered. The patient agreed with the plan and demonstrated an understanding of the instructions. ?  ?The patient was advised to call back or seek an in-person evaluation if the symptoms worsen or if the condition fails to improve as anticipated. ? ?I provided 25 minutes of non-face-to-face time during this encounter.  The patient was located at work.  The provider was located at home. ? ? ?[name], PMHNP  ? ?Subjective:  ? ?Patient ID:  Heather Davila is a 62 y.o. (DOB January 08, 1961) female. ? ?Chief Complaint:  ?Chief Complaint  ?Patient presents with  ? Depression  ? Anxiety  ? Sleeping Problem  ? ? ?HPI ?Heather Davila presents for follow-up of depression, anxiety, and insomnia.  She reports that she has started [employer]- "it is helping somewhat." She reports that she has occasionally forgotten second dose. "I'm still kind of down, but not as down as I was." She reports that she is less interested in things than she was when she was not depressed. Denies significantly irritability. Motivation is fair but not where I would like for it to be. She reports that her energy is ok. Had surgery on her foot 2 weeks ago and is slower. Some anxiety and reports that this has not changed since [place]. Sleep has been "ok." Typically taking Klonopin 0.5 mg 1/2 tab most nights. She wakes up once a night. Appetite has been good. No change with concentration.  Denies SI.  ? ?Did not see any change with stopping Viibryd.  ? ?Past Psychiatric Medication Trials: ?Trintellix - Initially effective and then was not effective when re-started ?Paxil- Ineffective. Helped initially. ?Prozac-Insomnia ?Lexapro- Effective and then no longer as effective ?Zoloft- Initially effective and then not as effective.  ?Viibryd ?Cymbalta- Ineffective. Helped initially. ?Effexor XR- Effective, well tolerated. ?Wellbutrin XL-Ineffective.May have increased anxiety. ?Amitriptyline ?Nortriptyline-Caused irritability, constipation ?Abilify- Helpful but caused severe insomnia. ?Rexulti-Helpful but caused insomnia. ?Seroquel - Ineffective ?Risperdal ?Olanzapine- Ineffective ?Klonopin- Effective ?Temazepam- Took during menopause ?Lunesta- Ineffective ?Ambien-Ineffective ?Sonata- Ineffective ?Trazodone- Ineffective ?Remeron-Ineffective. Does not recall wt gain.  ?Adderall- Took once and had adverse effect. ?Concerta- Effective ?Metadate-Not as effective as Concerta ?Lamictal- May have had an adverse effects. "Felt weird." Reports worsening s/s.  ?Lithium ?Gabapentin ? ?Review of Systems:  ?Review of Systems  ?Musculoskeletal:  Negative for gait problem.  ?     She had foot surgery 2 weeks ago and is wearing a boot.   ?Neurological:  Negative for tremors.  ?Psychiatric/Behavioral:    ?     Please refer to HPI  ? ?Medications: I have reviewed the patient's current medications. ? ?Current Outpatient Medications  ?Medication Sig Dispense Refill  ? clonazePAM (KLONOPIN) 0.25 MG disintegrating tablet Take 1-2 tabs sublingual at bedtime 60 tablet 2  ? doxycycline (VIBRAMYCIN) 50 MG capsule Take 50 mg by mouth daily.    ? estradiol (ESTRACE VAGINAL) 0.1 MG/GM vaginal cream 1 gram pv twice weekly at bedtime 42.5 g 3  ?  gabapentin (NEURONTIN) 300 MG capsule Take 1 capsule (300 mg total) by mouth at bedtime. 90 capsule 1  ? ibuprofen (ADVIL,MOTRIN) 200 MG tablet Take 200 mg by mouth every 6 (six) hours as needed.     ? Multiple Vitamin (MULTIVITAMIN) tablet Take 1 tablet by mouth daily.    ? TURMERIC PO Take by mouth.    ? valACYclovir (VALTREX) 500 MG tablet Take one twice daily for 3 days with any symptoms 30 tablet 3  ? brexpiprazole (REXULTI) 1 MG TABS tablet Take 1-2 tabs daily 180 tablet 0  ? clonazePAM (KLONOPIN) 0.5 MG tablet Take 1 tablet (0.5 mg total) by mouth at bedtime. 30 tablet 2  ? Dextromethorphan-buPROPion ER (AUVELITY) 45-105 MG TBCR Take 1 tablet by mouth 2 (two) times daily. 180 tablet 1  ? gabapentin (NEURONTIN) 100 MG capsule Take 1-2 capsules as needed 180 capsule 1  ? MAGNESIUM PO Take by mouth. (Patient not taking: Reported on 07/09/2021)    ? methylphenidate (CONCERTA) 36 MG PO CR tablet Take 1 tablet (36 mg total) by mouth daily. (Patient not taking: Reported on 07/09/2021) 30 tablet 0  ? [START ON 08/09/2021] methylphenidate (CONCERTA) 36 MG PO CR tablet Take 1 tablet (36 mg total) by mouth daily. 30 tablet 0  ? [START ON 09/06/2021] methylphenidate (CONCERTA) 36 MG PO CR tablet Take 1 tablet (36 mg total) by mouth daily. 30 tablet 0  ? ?No current facility-administered medications for this visit.  ? ? ?Medication Side Effects: None ? ?Allergies:  ?Allergies  ?Allergen Reactions  ? Ciprofloxacin   ?  REACTION: hives  ? Oxycodone-Acetaminophen   ?  REACTION: hives  ? ? ?Past Medical History:  ?Diagnosis Date  ? Adult acne   ? Anemia   ? Anorexia nervosa teen  ? DEPRESSION 08/09/2009  ? Hematoma 09/2020  ? post op after face lift  ? Insomnia   ? STD (sexually transmitted disease) 08/05/2013  ? 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  ? TRANSAMINASES, SERUM, ELEVATED 08/14/2009  ? ? ?Family History  ?Problem Relation Age of Onset  ? Cancer Mother 66  ?     colon cancer  ? Hypertension Mother   ? Colon cancer Mother 78  ?     colon rescection  ? Colon polyps Mother   ? Hypertension Father   ? Heart disease Father 31  ?     CABG62  ? Stroke Father 37  ? Heart disease Sister   ?     atrial fibrillation  ? Heart  attack Sister 99  ?     no stents  ? Depression Sister   ? Heart failure Paternal Aunt   ? Diabetes Paternal Uncle   ? Heart disease Paternal Grandfather   ? Esophageal cancer Neg Hx   ? Rectal cancer Neg Hx   ? Stomach cancer Neg Hx   ? ? ?Social History  ? ?Socioeconomic History  ? Marital status: Married  ?  Spouse name: [name]  ? Number of children: 3  ? Years of education: Master's  ? Highest education level: Not on file  ?Occupational History  ? Occupation: Nurse  ?  Employer: [employer]  ?Tobacco Use  ? Smoking status: Former  ?  Packs/day: 0.50  ?  Years: 5.00  ?  Pack years: 2.50  ?  Types: Cigarettes  ?  Quit date: 07/10/1987  ?  Years since quitting: 34.0  ? Smokeless tobacco: Never  ?Vaping Use  ?  Vaping Use: Never used  ?Substance and Sexual Activity  ? Alcohol use: No  ? Drug use: No  ? Sexual activity: Yes  ?  Partners: Male  ?  Birth control/protection: Post-menopausal, Other-see comments  ?  Comment: vasectomy  ?Other Topics Concern  ? Not on file  ?Social History Narrative  ? 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  ? Patient has three children.  ? Patient works at [employer].  ? Patient has a Master's degree.  ? Patient is left-handed.  ? Patient drinks 6 cups of coffee daily.  ? ?Social Determinants of Health  ? ?Financial Resource Strain: Not on file  ?Food Insecurity: Not on file  ?Transportation Needs: Not on file  ?Physical Activity: Not on file  ?Stress: Not on file  ?Social Connections: Not on file  ?Intimate Partner Violence: Not on file  ? ? ?Past Medical History, Surgical history, Social history, and Family history were reviewed and updated as appropriate.  ? ?Please see review of systems for further details on the patient's review from today.  ? ?Objective:  ? ?Physical Exam:  ?LMP 12/27/2007 (Exact Date)  ? ?Physical Exam ?Neurological:  ?   Mental Status: She is alert and oriented to person, place, and time.  ?    Cranial Nerves: No dysarthria.  ?Psychiatric:     ?   Attention and Perception: Attention and perception normal.     ?   Mood and Affect: Mood is depressed.     ?   Speech: Speech normal.     ?   Behavior: B

## 2021-08-08 ENCOUNTER — Other Ambulatory Visit: Payer: Self-pay | Admitting: Psychiatry

## 2021-08-08 DIAGNOSIS — F331 Major depressive disorder, recurrent, moderate: Secondary | ICD-10-CM

## 2021-08-12 MED ORDER — AUVELITY 45-105 MG PO TBCR: 1.0000 | EXTENDED_RELEASE_TABLET | Freq: Two times a day (BID) | ORAL | 2 refills | Status: DC

## 2021-08-13 MED ORDER — DESVENLAFAXINE SUCCINATE ER 50 MG PO TB24: 50.0000 mg | ORAL_TABLET | Freq: Every day | ORAL | 0 refills | Status: DC

## 2021-08-13 NOTE — Addendum Note (Signed)
Addended by: Sharyl Nimrod on: 08/13/2021 04:51 PM ? ? Modules accepted: Orders ? ?

## 2021-08-15 ENCOUNTER — Telehealth: Payer: 59 | Admitting: Psychiatry

## 2021-08-15 ENCOUNTER — Telehealth: Payer: Self-pay | Admitting: Psychiatry

## 2021-08-15 NOTE — Telephone Encounter (Signed)
Pt says she was told she needed a Prior Auth for her Pristiq.  Pharmacy is [facility] in [place].  Pls advise status. ? ?Next appt 5/11 ?

## 2021-08-15 NOTE — Telephone Encounter (Signed)
[facility]: Bur[facility] Riis - Rx #: [id number] ? ?I sent to plan today.  ?

## 2021-08-18 ENCOUNTER — Telehealth: Payer: Self-pay

## 2021-08-18 NOTE — Telephone Encounter (Signed)
Prior Approval received for DESVENLAFAXINE ER 50 MG PA# :[id number]; effective 07/19/2021-08/18/2022; ?

## 2021-08-18 NOTE — Telephone Encounter (Signed)
Prior Authorization submitted and approved for AUVELITY 45-105 MG IR ER effective 06/10/2021-07/26/2022 with [employer] HB#[id number] ?

## 2021-08-21 ENCOUNTER — Ambulatory Visit (INDEPENDENT_AMBULATORY_CARE_PROVIDER_SITE_OTHER): Payer: 59 | Admitting: Psychiatry

## 2021-08-21 DIAGNOSIS — F330 Major depressive disorder, recurrent, mild: Secondary | ICD-10-CM

## 2021-08-21 NOTE — Progress Notes (Signed)
?      [facility] Counselor/Therapist Progress Note ? ?Patient ID: Heather Davila, MRN: [id number],   ? ?Date: 08/21/2021 ? ?Time Spent: 50 minutes start time 12:07 PM end time 12:57 PM ? ?Treatment Type: Individual Therapy ? ?Reported Symptoms: depression,anxiety ? ?Mental Status Exam: ? ?Appearance:   Well Groomed     ?Behavior:  Appropriate  ?Motor:  Normal  ?Speech/Language:   Normal Rate  ?Affect:  Appropriate  ?Mood:  normal  ?Thought process:  normal  ?Thought content:    WNL  ?Sensory/Perceptual disturbances:    WNL  ?Orientation:  oriented to person, place, time/date, and situation  ?Attention:  Good  ?Concentration:  Good  ?Memory:  WNL  ?Fund of knowledge:   Good  ?Insight:    Good  ?Judgment:   Good  ?Impulse Control:  Good  ? ?Risk Assessment: ?Danger to Self:  No ?Self-injurious Behavior: No ?Danger to Others: No ?Duty to Warn:no ?Physical Aggression / Violence:No  ?Access to Firearms a concern: No  ?Gang Involvement:No  ? ?Subjective: Patient was present for session.  She shared that she was doing okay being on the new antidepressant.  Patient went on to share that she is just frustrated about being depressed.  Had patient think through more specifics and when she feels the depression.  She shared it is more in the morning and afternoon.  Patient did processing set on that feeling, suds level 7, negative cognition "there is something wrong with me" felt anxiety in her shoulders.  Patient was able to recognize that the depression really started when she transitioned from her elementary and middle school to high school.  Did some work with that part of her.  Patient responded well and was able to recognize what she needed to affirm herself with more regularly.  Discussed  Dr. Tawanna Solo neuro cycle detox and podcast that she can watch of hers on line.  Was educated on processing that will continue over the next week and encouraged to take time to journal regularly. ? ?Interventions: Cognitive  Behavioral Therapy and Solution-Oriented/Positive Psychology, and EMDR, BS P ? ?Diagnosis: ?  ICD-10-CM   ?1. Mild episode of recurrent major depressive disorder ([place])  F33.0   ?  ? ? ?Plan: Patient is to use grounding exercises to manage negative emotions.  Patient is to journal to release negative emotions appropriately.  Patient is to research Dr. Tawanna Solo podcast on the neuro cycle.  Patient is to take medication as directed ?Long-term goal: Elevate mood and show evidence of usual energy activities and socialization level ?Short-term goal: Patient will increase ability to express feelings to others and set limits with others.   ? ?[name], [facility] ? ? ? ? ? ? ? ? ? ? ? ? ? ? ? ? ? ? ?

## 2021-09-04 ENCOUNTER — Ambulatory Visit (INDEPENDENT_AMBULATORY_CARE_PROVIDER_SITE_OTHER): Payer: 59 | Admitting: Psychiatry

## 2021-09-04 DIAGNOSIS — F330 Major depressive disorder, recurrent, mild: Secondary | ICD-10-CM

## 2021-09-04 MED ORDER — DESVENLAFAXINE SUCCINATE ER 100 MG PO TB24: 100.0000 mg | ORAL_TABLET | Freq: Every day | ORAL | 1 refills | Status: DC

## 2021-09-04 NOTE — Addendum Note (Signed)
Addended by: Sharyl Nimrod on: 09/04/2021 12:30 PM ? ? Modules accepted: Orders ? ?

## 2021-09-04 NOTE — Progress Notes (Signed)
?    [facility] Counselor/Therapist Progress Note ? ?Patient ID: Heather Davila, MRN: [id number],   ? ?Date: 09/04/2021 ? ?Time Spent: 41 minutes 12:06 Pm end time 12:47 PM ?Virtual Visit via Video Note ?Connected with patient by a telemedicine/telehealth application, with their informed consent, and verified patient privacy and that I am speaking with the correct person using two identifiers. I discussed the limitations, risks, security and privacy concerns of performing psychotherapy and the availability of in person appointments. I also discussed with the patient that there may be a patient responsible charge related to this service. The patient expressed understanding and agreed to proceed. ?I discussed the treatment planning with the patient. The patient was provided an opportunity to ask questions and all were answered. The patient agreed with the plan and demonstrated an understanding of the instructions. The patient was advised to call  our office if  symptoms worsen or feel they are in a crisis state and need immediate contact. ?  ?Therapist Location: office ?Patient Location: Work office ?  ? ?Treatment Type: Individual Therapy ? ?Reported Symptoms: sadness, anxiety, triggered responses ? ?Mental Status Exam: ? ?Appearance:   Well Groomed     ?Behavior:  Appropriate  ?Motor:  Normal  ?Speech/Language:   Normal Rate  ?Affect:  Appropriate  ?Mood:  sad  ?Thought process:  normal  ?Thought content:    WNL  ?Sensory/Perceptual disturbances:    WNL  ?Orientation:  oriented to person, place, time/date, and situation  ?Attention:  Good  ?Concentration:  Good  ?Memory:  WNL  ?Fund of knowledge:   Good  ?Insight:    Good  ?Judgment:   Good  ?Impulse Control:  Good  ? ?Risk Assessment: ?Danger to Self:  No ?Self-injurious Behavior: No ?Danger to Others: No ?Duty to Warn:no ?Physical Aggression / Violence:No  ?Access to Firearms a concern: No  ?Gang Involvement:No  ? ?Subjective: Met with patient via virtual session.  She shared she was still feeling stuck and ready get moving forward with things.  Patient was encouraged to think of there was an automatic negative thought that keeps surfacing for her.  She was able to identify I am afraid of being alone as the cognition.  She went on to share that she remembers being alone most of her life.  She was able to think back to being 6-ish and crying at the window looking for her mom, suds level 8, negative cognition "I am afraid of being alone" felt anxiety in her stomach.  Patient was able to reduce suds level to 5 but not able to resolve the set.  A couple different strategies were tried to since it was a video session patient struggled.  Agreed to continue processing at next session which should be in person.  The importance of working on her automatic negative thoughts and starting to change them doing that Dr. Tawanna Solo neuro cycle strategy was discussed with patient.  The importance of journaling any repetitive thoughts to be addressed at next session was also discussed with patient.  Patient reported feeling more relaxed at the end of session even though processing did not get completed. ? ?Interventions: Cognitive Behavioral Therapy, Solution-Oriented/Positive Psychology, Eye Movement Desensitization and Reprocessing (EMDR), and BSP ? ?Diagnosis: ?  ICD-10-CM   ?1. Mild episode of recurrent major depressive disorder ([place])  F33.0   ?  ? ? ?Plan: Patient is to use grounding exercises to manage negative emotions.  Patient is to journal to release negative emotions appropriately.  Patient is to practice Dr. Tawanna Solo  neuro cycle exercises.  Patient is to take medication as directed ?Long-term goal: Elevate mood and show evidence of usual energy activities and socialization level ?Short-term goal: Patient will increase ability to express feelings to others and set limits with others.   ? ?[name], [facility] ? ? ? ? ? ? ? ? ? ? ? ? ? ? ? ? ? ? ?

## 2021-09-05 ENCOUNTER — Encounter: Payer: Self-pay | Admitting: Psychiatry

## 2021-09-05 ENCOUNTER — Telehealth: Payer: 59 | Admitting: Psychiatry

## 2021-09-05 ENCOUNTER — Telehealth (INDEPENDENT_AMBULATORY_CARE_PROVIDER_SITE_OTHER): Payer: 59 | Admitting: Psychiatry

## 2021-09-05 DIAGNOSIS — G4700 Insomnia, unspecified: Secondary | ICD-10-CM | POA: Diagnosis not present

## 2021-09-05 DIAGNOSIS — F331 Major depressive disorder, recurrent, moderate: Secondary | ICD-10-CM | POA: Diagnosis not present

## 2021-09-05 DIAGNOSIS — F419 Anxiety disorder, unspecified: Secondary | ICD-10-CM | POA: Diagnosis not present

## 2021-09-05 DIAGNOSIS — F900 Attention-deficit hyperactivity disorder, predominantly inattentive type: Secondary | ICD-10-CM

## 2021-09-05 MED ORDER — GABAPENTIN 100 MG PO CAPS: ORAL_CAPSULE | ORAL | 1 refills | Status: DC

## 2021-09-05 MED ORDER — METHYLPHENIDATE HCL ER (OSM) 36 MG PO TBCR: 36.0000 mg | EXTENDED_RELEASE_TABLET | Freq: Every day | ORAL | 0 refills | Status: DC

## 2021-09-05 NOTE — Progress Notes (Signed)
Heather Davila ?[id number] ?1961-03-15 ?61 y.o. ? ?Virtual Visit via Video Note ? ?I connected with pt @ on 09/05/21 at  8:30 AM EDT by a video enabled telemedicine application and verified that I am speaking with the correct person using two identifiers. ?  ?I discussed the limitations of evaluation and management by telemedicine and the availability of in person appointments. The patient expressed understanding and agreed to proceed. ? ?I discussed the assessment and treatment plan with the patient. The patient was provided an opportunity to ask questions and all were answered. The patient agreed with the plan and demonstrated an understanding of the instructions. ?  ?The patient was advised to call back or seek an in-person evaluation if the symptoms worsen or if the condition fails to improve as anticipated. ? ?I provided 15 minutes of non-face-to-face time during this encounter.  The patient was located at work.  The provider was located at [place]. ? ? ?[name], PMHNP  ? ?Subjective:  ? ?Patient ID:  Heather Davila is a 61 y.o. (DOB Jun 13, 1960) female. ? ?Chief Complaint:  ?Chief Complaint  ?Patient presents with  ? Depression  ? Follow-up  ?  Anxiety  ? ? ?Depression ?       Associated symptoms include no headaches. ?Heather Davila presents for follow-up of depression, anxiety, and insomnia.  ?She reports, "I think the medicine is starting to work... I feel like I am not as depressed." She reports some slight improvement in energy and motivation. She reports that she started Pristiq on 08/20/21 and increased Pristiq to 100 mg after a week since "I didn't see anything." Anxiety has been "ok." Some slight improvement in interest "but not where I want it to be." Concentration has been ok. Appetite has been good. Denies SI.  ? ?She reports that she did not sleep well last night. She reports that sometimes she needs a total of Gabapentin 600 mg. ? ?Has seen [name], [facility] for therapy. She  reports that she may need to go into the office for treatments.  ? ?Past Psychiatric Medication Trials: ?Trintellix - Initially effective and then was not effective when re-started ?Paxil- Ineffective. Helped initially. ?Prozac-Insomnia ?Lexapro- Effective and then no longer as effective ?Zoloft- Initially effective and then not as effective.  ?Viibryd ?Cymbalta- Ineffective. Helped initially. ?Effexor XR- Effective, well tolerated. ?Wellbutrin XL-Ineffective.May have increased anxiety. ?Amitriptyline ?Nortriptyline-Caused irritability, constipation ?Auvelity- ineffective ?Abilify- Helpful but caused severe insomnia. ?Rexulti-Helpful but caused insomnia. ?Seroquel - Ineffective ?Risperdal ?Olanzapine- Ineffective ?Klonopin- Effective ?Temazepam- Took during menopause ?Lunesta- Ineffective ?Ambien-Ineffective ?Sonata- Ineffective ?Trazodone- Ineffective ?Remeron-Ineffective. Does not recall wt gain.  ?Adderall- Took once and had adverse effect. ?Concerta- Effective ?Metadate-Not as effective as Concerta ?Lamictal- May have had an adverse effects. "Felt weird." Reports worsening s/s.  ?Lithium ?Gabapentin ? ?Review of Systems:  ?Review of Systems  ?Constitutional:  Negative for diaphoresis.  ?Musculoskeletal:  Negative for gait problem.  ?     Had foot surgery.  ?Neurological:  Negative for headaches.  ?Psychiatric/Behavioral:  Positive for depression.   ?     Please refer to HPI  ? ?Medications: I have reviewed the patient's current medications. ? ?Current Outpatient Medications  ?Medication Sig Dispense Refill  ? brexpiprazole (REXULTI) 1 MG TABS tablet Take 1-2 tabs daily 180 tablet 0  ? desvenlafaxine (PRISTIQ) 100 MG 24 hr tablet Take 1 tablet (100 mg total) by mouth daily. 30 tablet 1  ? doxycycline (VIBRAMYCIN) 50 MG capsule Take 50 mg by mouth daily.    ?  estradiol (ESTRACE VAGINAL) 0.1 MG/GM vaginal cream 1 gram pv twice weekly at bedtime 42.5 g 3  ? gabapentin (NEURONTIN) 300 MG capsule Take 1 capsule (300  mg total) by mouth at bedtime. 90 capsule 1  ? ibuprofen (ADVIL,MOTRIN) 200 MG tablet Take 200 mg by mouth every 6 (six) hours as needed.    ? MAGNESIUM PO Take by mouth.    ? methylphenidate (CONCERTA) 36 MG PO CR tablet Take 1 tablet (36 mg total) by mouth daily. 30 tablet 0  ? Multiple Vitamin (MULTIVITAMIN) tablet Take 1 tablet by mouth daily.    ? TURMERIC PO Take by mouth.    ? clonazePAM (KLONOPIN) 0.5 MG tablet Take 1 tablet (0.5 mg total) by mouth at bedtime. 30 tablet 2  ? gabapentin (NEURONTIN) 100 MG capsule Take 1-3 capsules as needed 270 capsule 1  ? [START ON 09/06/2021] methylphenidate (CONCERTA) 36 MG PO CR tablet Take 1 tablet (36 mg total) by mouth daily. 30 tablet 0  ? [START ON 10/07/2021] methylphenidate (CONCERTA) 36 MG PO CR tablet Take 1 tablet (36 mg total) by mouth daily. 30 tablet 0  ? valACYclovir (VALTREX) 500 MG tablet Take one twice daily for 3 days with any symptoms 30 tablet 3  ? ?No current facility-administered medications for this visit.  ? ? ?Medication Side Effects: None ? ?Allergies:  ?Allergies  ?Allergen Reactions  ? Ciprofloxacin   ?  REACTION: hives  ? Oxycodone-Acetaminophen   ?  REACTION: hives  ? ? ?Past Medical History:  ?Diagnosis Date  ? Adult acne   ? Anemia   ? Anorexia nervosa teen  ? DEPRESSION 08/09/2009  ? Hematoma 09/2020  ? post op after face lift  ? Insomnia   ? STD (sexually transmitted disease) 08/05/2013  ? 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  ? TRANSAMINASES, SERUM, ELEVATED 08/14/2009  ? ? ?Family History  ?Problem Relation Age of Onset  ? Cancer Mother 52  ?     colon cancer  ? Hypertension Mother   ? Colon cancer Mother 108  ?     colon rescection  ? Colon polyps Mother   ? Hypertension Father   ? Heart disease Father 29  ?     [id number]  ? Stroke Father 2  ? Heart disease Sister   ?     atrial fibrillation  ? Heart attack Sister 70  ?     no stents  ? Depression Sister   ? Heart failure Paternal Aunt   ? Diabetes Paternal Uncle   ? Heart disease Paternal  Grandfather   ? Esophageal cancer Neg Hx   ? Rectal cancer Neg Hx   ? Stomach cancer Neg Hx   ? ? ?Social History  ? ?Socioeconomic History  ? Marital status: Married  ?  Spouse name: [name]  ? Number of children: 3  ? Years of education: Master's  ? Highest education level: Not on file  ?Occupational History  ? Occupation: Nurse  ?  Employer: [employer]  ?Tobacco Use  ? Smoking status: Former  ?  Packs/day: 0.50  ?  Years: 5.00  ?  Pack years: 2.50  ?  Types: Cigarettes  ?  Quit date: 07/10/1987  ?  Years since quitting: 34.1  ? Smokeless tobacco: Never  ?Vaping Use  ? Vaping Use: Never used  ?Substance and Sexual Activity  ? Alcohol use: No  ? Drug use: No  ? Sexual activity: Yes  ?  Partners: Male  ?  Birth control/protection: Post-menopausal, Other-see comments  ?  Comment: vasectomy  ?Other Topics Concern  ? Not on file  ?Social History Narrative  ? 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  ? Patient has three children.  ? Patient works at [employer].  ? Patient has a Master's degree.  ? Patient is left-handed.  ? Patient drinks 6 cups of coffee daily.  ? ?Social Determinants of Health  ? ?Financial Resource Strain: Not on file  ?Food Insecurity: Not on file  ?Transportation Needs: Not on file  ?Physical Activity: Not on file  ?Stress: Not on file  ?Social Connections: Not on file  ?Intimate Partner Violence: Not on file  ? ? ?Past Medical History, Surgical history, Social history, and Family history were reviewed and updated as appropriate.  ? ?Please see review of systems for further details on the patient's review from today.  ? ?Objective:  ? ?Physical Exam:  ?LMP 12/27/2007 (Exact Date)  ? ?Physical Exam ?Neurological:  ?   Mental Status: She is alert and oriented to person, place, and time.  ?   Cranial Nerves: No dysarthria.  ?Psychiatric:     ?   Attention and Perception: Attention and perception normal.     ?   Mood and Affect: Mood is  anxious and depressed.     ?   Speech: Speech normal.     ?   Behavior: Behavior is cooperative.     ?   Thought Content: Thought content normal. Thought content is not paranoid or delusional. Thought cont

## 2021-09-13 ENCOUNTER — Encounter: Payer: Self-pay | Admitting: Gastroenterology

## 2021-09-13 MED ORDER — CONCERTA 36 MG PO TBCR: 36.0000 mg | EXTENDED_RELEASE_TABLET | Freq: Every day | ORAL | 0 refills | Status: DC

## 2021-09-16 ENCOUNTER — Ambulatory Visit (INDEPENDENT_AMBULATORY_CARE_PROVIDER_SITE_OTHER): Payer: Self-pay | Admitting: Plastic Surgery

## 2021-09-16 DIAGNOSIS — Z719 Counseling, unspecified: Secondary | ICD-10-CM

## 2021-09-16 NOTE — Progress Notes (Signed)
Preoperative Dx: hyperpigmentation of face  Postoperative Dx:  same  Procedure: laser to face   Anesthesia: none  Description of Procedure:  Risks and complications were explained to the patient. Consent was confirmed and signed. Eye protection was placed. Time out was called and all information was confirmed to be correct. The area  area was prepped with alcohol and wiped dry. The BBL laser was set at 590 nm 6 J/cm2. And 515 nm at 6 J. The face was lasered. The patient tolerated the procedure well and there were no complications. The patient is to follow up in 4 weeks.

## 2021-09-17 ENCOUNTER — Other Ambulatory Visit: Payer: Managed Care, Other (non HMO) | Admitting: Plastic Surgery

## 2021-09-18 ENCOUNTER — Ambulatory Visit (INDEPENDENT_AMBULATORY_CARE_PROVIDER_SITE_OTHER): Payer: 59 | Admitting: Psychiatry

## 2021-09-18 DIAGNOSIS — F330 Major depressive disorder, recurrent, mild: Secondary | ICD-10-CM | POA: Diagnosis not present

## 2021-09-18 MED ORDER — DEXMETHYLPHENIDATE HCL ER 10 MG PO CP24
10.0000 mg | ORAL_CAPSULE | Freq: Every day | ORAL | 0 refills | Status: DC
Start: 2021-09-18 — End: 2021-09-26

## 2021-09-18 NOTE — Addendum Note (Signed)
Addended by: Sharyl Nimrod on: 09/18/2021 12:20 PM   Modules accepted: Orders

## 2021-09-18 NOTE — Progress Notes (Signed)
[facility] Counselor/Therapist Progress Note  Patient ID: Heather Davila, MRN: [id number],    Date: 09/18/2021  Time Spent: 42 minutes Start time 8:02 AM end time 8:44 AM Video Started at 8:02 ended at 8:39 AM Phone had to be added to video due to not being able to hear client Phone only started at 8:39 ended at 8:44 AM Virtual Visit via Video Note Connected with patient by a telemedicine/telehealth application, with their informed consent, and verified patient privacy and that I am speaking with the correct person using two identifiers. I discussed the limitations, risks, security and privacy concerns of performing psychotherapy and the availability of in person appointments. I also discussed with the patient that there may be a patient responsible charge related to this service. The patient expressed understanding and agreed to proceed. I discussed the treatment planning with the patient. The patient was provided an opportunity to ask questions and all were answered. The patient agreed with the plan and demonstrated an understanding of the instructions. The patient was advised to call  our office if  symptoms worsen or feel they are in a crisis state and need immediate contact.   Therapist Location: office Patient Location: home    Treatment Type: Individual Therapy  Reported Symptoms: depression, anxiety, sleep issues,   Mental Status Exam:  Appearance:   Well Groomed     Behavior:  Appropriate  Motor:  Normal  Speech/Language:   Normal Rate  Affect:  Appropriate  Mood:  anxious and sad  Thought process:  normal  Thought content:    WNL  Sensory/Perceptual disturbances:    WNL  Orientation:  oriented to person, place, time/date, and situation  Attention:  Good  Concentration:  Good  Memory:  WNL  Fund of knowledge:   Good  Insight:    Good  Judgment:   Good  Impulse Control:  Good   Risk Assessment: Danger to Self:  No Self-injurious Behavior: No Danger to Others:  No Duty to Warn:no Physical Aggression / Violence:No  Access to Firearms a concern: No  Gang Involvement:No   Subjective: Met with patient via virtual session.  She explained she could not come in for a session and had to keep the session short due to having a Webinar online that she had to attend for work.  Patient reported she had been following through with plans to work on the information given to her on Heather Davila leaf.  She shared she had gone on podcast and decided to do the 21-day detox with the app.  She shared she was having some struggles and questions about different parts of the homework.  She shared she struggles with getting in touch with emotions and getting in touch with her body so that she can recognize when she gets triggered and what she is feeling and thinking.  Discussed the fact that when somebody has had to stop their feelings in and not pay attention to their body because they did not feel safe it is very difficult for them to start paying attention to their bodies and recognizing their feelings and thoughts.  Patient was able to recognize that as a young child she would stuff her feelings because her mother did not want her sharing anything that would upset her sister or anyone else.  Patient was encouraged to realize that in the processing that she is doing in session she is starting to learn how to focus on her body and her feelings as well  as her thoughts and that it will get easier to recognize as she continues in treatment.  Also discussed the importance of being able to identify the automatic negative thoughts and then to use that information to identify triggers and how they impact her physically.  Was also encouraged to recognize that this was a process of rethinking and reconditioning her automatic negative thoughts so it is going to take some time and repetition but she is going to recognize change as she progresses.  Patient stated she is also recognizing some progress  even though it may not be as much as she wants it to and she feels very committed to the process and hopeful that things will continue to progress.  She agreed to continue working on her neuro cycle changes with Heather Davila and through that journey will decide what she needs to address in next session.  Interventions: Cognitive Behavioral Therapy and Solution-Oriented/Positive Psychology  Diagnosis:   ICD-10-CM   1. Mild episode of recurrent major depressive disorder ([name])  F33.0       Plan: Patient is to use grounding exercises to manage negative emotions.  Patient is to journal to release negative emotions appropriately.  Patient is to practice Heather Davila  neuro cycle exercises.  Patient is to continue working on paying attention to what her body is feeling as well as emotions that are surfacing during different situations.  Patient is to take medication as directed Long-term goal: Elevate mood and show evidence of usual energy activities and socialization level Short-term goal: Patient will increase ability to express feelings to others and set limits with others.    [name], [facility]

## 2021-09-26 MED ORDER — DEXMETHYLPHENIDATE HCL ER 10 MG PO CP24: 20.0000 mg | ORAL_CAPSULE | Freq: Every day | ORAL | 0 refills | Status: DC

## 2021-09-26 NOTE — Addendum Note (Signed)
Addended by: Sharyl Nimrod on: 09/26/2021 12:42 PM   Modules accepted: Orders

## 2021-09-27 ENCOUNTER — Other Ambulatory Visit: Payer: Self-pay | Admitting: Psychiatry

## 2021-09-27 DIAGNOSIS — F331 Major depressive disorder, recurrent, moderate: Secondary | ICD-10-CM

## 2021-10-02 ENCOUNTER — Ambulatory Visit (INDEPENDENT_AMBULATORY_CARE_PROVIDER_SITE_OTHER): Payer: 59 | Admitting: Psychiatry

## 2021-10-02 DIAGNOSIS — F330 Major depressive disorder, recurrent, mild: Secondary | ICD-10-CM

## 2021-10-02 MED ORDER — DEXMETHYLPHENIDATE HCL ER 20 MG PO CP24: 20.0000 mg | ORAL_CAPSULE | Freq: Every day | ORAL | 0 refills | Status: DC

## 2021-10-02 NOTE — Progress Notes (Signed)
      [facility] Counselor/Therapist Progress Note  Patient ID: Heather Davila, MRN: [id number],    Date: 10/02/2021  Time Spent: 47 minutes start time 7:51 AM end time 8:38 AM  Treatment Type: Individual Therapy  Reported Symptoms: sleep issues, sadness, fatigue, triggered responses  Mental Status Exam:  Appearance:   Well Groomed     Behavior:  Appropriate  Motor:  Normal  Speech/Language:   Normal Rate  Affect:  Appropriate  Mood:  anxious  Thought process:  normal  Thought content:    WNL  Sensory/Perceptual disturbances:    WNL  Orientation:  oriented to person, place, time/date, and situation  Attention:  Good  Concentration:  Good  Memory:  WNL  Fund of knowledge:   Good  Insight:    Good  Judgment:   Good  Impulse Control:  Good   Risk Assessment: Danger to Self:  No Self-injurious Behavior: No Danger to Others: No Duty to Warn:no Physical Aggression / Violence:No  Access to Firearms a concern: No  Gang Involvement:No   Subjective: Patient was present for session. She shared she has been working on her 21 day detox for the neurocycle and that has been helpful.  Patient reported she feels that the things in treatment are stirring up things that she is still not where she wants to be with things.  Encouraged her to recognize that it does take time and she needs to be patient with herself.  Went on to process an incident that occurred when her mother talked to her like a child.  Suds level 8, negative cognition "I am weak" felt anger in her neck and shoulders.  Patient was able to reduce suds level to 3.  She was able to recognize the 61-year old part of her was getting triggered and did some work with that part of herself.  Patient reported feeling much better at the end of session.  Discussed the importance of continuing the processing between sessions and ways to release things appropriately.  Patient is also to work on affirming the younger parts of herself and  continuing to do the Dr. Tawanna Solo neuro psych work.  Interventions: Cognitive Behavioral Therapy, Eye Movement Desensitization and Reprocessing (EMDR), and Insight-Oriented  Diagnosis:   ICD-10-CM   1. Mild episode of recurrent major depressive disorder ([place])  F33.0       Plan:  Patient is to use grounding exercises to manage negative emotions.  Patient is to journal to release negative emotions appropriately.  Patient is to practice Dr. Tawanna Solo  neuro cycle exercises.  Patient is to continue working on paying attention to what her body is feeling as well as emotions that are surfacing during different situations.  Patient is to take medication as directed Long-term goal: Elevate mood and show evidence of usual energy activities and socialization level Short-term goal: Patient will increase ability to express feelings to others and set limits with others.    Heather Davila, [facility]

## 2021-10-02 NOTE — Addendum Note (Signed)
Addended by: Sharyl Nimrod on: 10/02/2021 08:21 AM   Modules accepted: Orders

## 2021-10-08 ENCOUNTER — Telehealth (INDEPENDENT_AMBULATORY_CARE_PROVIDER_SITE_OTHER): Payer: 59 | Admitting: Psychiatry

## 2021-10-08 ENCOUNTER — Encounter: Payer: Self-pay | Admitting: Psychiatry

## 2021-10-08 VITALS — BP 110/60 | HR 58

## 2021-10-08 DIAGNOSIS — G4700 Insomnia, unspecified: Secondary | ICD-10-CM | POA: Diagnosis not present

## 2021-10-08 DIAGNOSIS — F419 Anxiety disorder, unspecified: Secondary | ICD-10-CM | POA: Diagnosis not present

## 2021-10-08 DIAGNOSIS — F331 Major depressive disorder, recurrent, moderate: Secondary | ICD-10-CM | POA: Diagnosis not present

## 2021-10-08 DIAGNOSIS — F900 Attention-deficit hyperactivity disorder, predominantly inattentive type: Secondary | ICD-10-CM

## 2021-10-08 MED ORDER — AZSTARYS 52.3-10.4 MG PO CAPS: 1.0000 | ORAL_CAPSULE | Freq: Every morning | ORAL | 0 refills | Status: DC

## 2021-10-08 MED ORDER — DESVENLAFAXINE SUCCINATE ER 50 MG PO TB24: 50.0000 mg | ORAL_TABLET | Freq: Every day | ORAL | 0 refills | Status: DC

## 2021-10-08 NOTE — Progress Notes (Signed)
Heather Davila [id number] [birth year] 61 y.o.  Virtual Visit via Video Note  I connected with pt @ on 10/08/21 at  9:30 AM EDT by a video enabled telemedicine application and verified that I am speaking with the correct person using two identifiers.   I discussed the limitations of evaluation and management by telemedicine and the availability of in person appointments. The patient expressed understanding and agreed to proceed.  I discussed the assessment and treatment plan with the patient. The patient was provided an opportunity to ask questions and all were answered. The patient agreed with the plan and demonstrated an understanding of the instructions.   The patient was advised to call back or seek an in-person evaluation if the symptoms worsen or if the condition fails to improve as anticipated.  I provided 25 minutes of non-face-to-face time during this encounter.  The patient was located at work.  The provider was located at [place].   [name], PMHNP   Subjective:   Patient ID:  Heather Davila is a 61 y.o. (DOB [birth year]) female.  Chief Complaint:  Chief Complaint  Patient presents with   Depression   Anxiety    HPI Heather Davila presents for follow-up of depression, anxiety, and insomnia. She reports that Pristiq is "ok." She reports depression is "better." She reports that mornings are typically worse for her. She reports that anxiety has been "alright" and has improved slightly. She denies panic s/s. Sleeping ok overall with occasional sleep disturbance. Energy and motivation have been good. Some slight increase in enjoyment in things. Denies SI.   She reports that Concerta worked better for her than Focalin XR. She reports that Focalin XR 10 mg was not adequate and Focalin XR is helpful for about 4 hours and then wears off.   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   Past Psychiatric Medication Trials: Trintellix - Initially  effective and then was not effective when re-started Paxil- Ineffective. Helped initially. Prozac-Insomnia Lexapro- Effective and then no longer as effective Zoloft- Initially effective and then not as effective.  Viibryd Cymbalta- Ineffective. Helped initially. Effexor XR- Effective, well tolerated. Wellbutrin XL-Ineffective.May have increased anxiety. Amitriptyline Nortriptyline-Caused irritability, constipation Auvelity- ineffective Abilify- Helpful but caused severe insomnia. Rexulti-Helpful but caused insomnia. Seroquel - Ineffective Risperdal Olanzapine- Ineffective Klonopin- Effective Temazepam- Took during menopause Lunesta- Ineffective Ambien-Ineffective Sonata- Ineffective Trazodone- Ineffective Remeron-Ineffective. Does not recall wt gain.  Adderall- Took once and had adverse effect. Concerta- Effective Metadate-Not as effective as Concerta Dexmethylphenidate- Not as effective as Concerta Lamictal- May have had an adverse effects. "Felt weird." Reports worsening s/s.  Lithium Gabapentin   Review of Systems:  Review of Systems  Cardiovascular:  Negative for palpitations.  Musculoskeletal:  Negative for gait problem.  Neurological:  Negative for tremors.  Psychiatric/Behavioral:         Please refer to HPI    Medications: I have reviewed the patient's current medications.  Current Outpatient Medications  Medication Sig Dispense Refill   brexpiprazole (REXULTI) 1 MG TABS tablet Take 1-2 tabs daily 180 tablet 0   desvenlafaxine (PRISTIQ) 100 MG 24 hr tablet TAKE 1 TABLET BY MOUTH EVERY DAY 90 tablet 0   desvenlafaxine (PRISTIQ) 50 MG 24 hr tablet Take 1 tablet (50 mg total) by mouth daily. Take with a 100 mg tablet to equal total daily dose of 150 mg 90 tablet 0   dexmethylphenidate (FOCALIN XR) 20 MG 24 hr capsule Take 1 capsule (20 mg total) by mouth daily.  30 capsule 0   doxycycline (VIBRAMYCIN) 50 MG capsule Take 50 mg by mouth daily.     estradiol  (ESTRACE VAGINAL) 0.1 MG/GM vaginal cream 1 gram pv twice weekly at bedtime 42.5 g 3   gabapentin (NEURONTIN) 100 MG capsule Take 1-3 capsules as needed 270 capsule 1   gabapentin (NEURONTIN) 300 MG capsule Take 1 capsule (300 mg total) by mouth at bedtime. 90 capsule 1   ibuprofen (ADVIL,MOTRIN) 200 MG tablet Take 200 mg by mouth every 6 (six) hours as needed.     MAGNESIUM PO Take by mouth.     Multiple Vitamin (MULTIVITAMIN) tablet Take 1 tablet by mouth daily.     Serdexmethylphen-Dexmethylphen (AZSTARYS) 52.3-10.4 MG CAPS Take 1 capsule by mouth every morning. 30 capsule 0   TURMERIC PO Take by mouth.     clonazePAM (KLONOPIN) 0.5 MG tablet Take 1 tablet (0.5 mg total) by mouth at bedtime. 30 tablet 2   valACYclovir (VALTREX) 500 MG tablet Take one twice daily for 3 days with any symptoms 30 tablet 3   No current facility-administered medications for this visit.    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   TRANSAMINASES, SERUM, ELEVATED 08/14/2009    Family History  Problem Relation Age of Onset   Cancer Mother 62       colon cancer   Hypertension Mother    Colon cancer Mother 61       colon rescection   Colon polyps Mother    Hypertension Father    Heart disease Father 44       CABG62   Stroke Father 26   Heart disease Sister        atrial fibrillation   Heart attack Sister 31       no stents   Depression Sister    Heart failure Paternal Aunt    Diabetes Paternal Uncle    Heart disease Paternal Grandfather    Esophageal cancer Neg Hx    Rectal cancer Neg Hx    Stomach cancer Neg Hx      Social History   Socioeconomic History   Marital status: Married    Spouse name: Charles   Number of children: 3   Years of education: Master's   Highest education level: Not on file  Occupational History   Occupation: Optician, dispensing: [employer]  Tobacco Use   Smoking status: Former    Packs/day: 0.50    Years: 5.00    Total pack years: 2.50    Types: Cigarettes    Quit date: 07/10/1987    Years since quitting: 34.2   Smokeless tobacco: Never  Vaping Use   Vaping Use: Never used  Substance and Sexual Activity   Alcohol use: No   Drug use: No   Sexual activity: Yes    Partners: Male    Birth control/protection: Post-menopausal, Other-see comments    Comment: vasectomy  Other Topics Concern   Not on file  Social History Narrative   Patient  is married Tax inspector) and lives at home with her husband and two children.   Patient has three children.   Patient works at [employer].   Patient has a Master's degree.   Patient is left-handed.   Patient drinks 6 cups of coffee daily.   Social Determinants of Health   Financial Resource Strain: Not on file  Food Insecurity: Not on file  Transportation Needs: Not on file  Physical Activity: Not on file  Stress: Not on file  Social Connections: Not on file  Intimate Partner Violence: Not on file    Past Medical History, Surgical history, Social history, and Family history were reviewed and updated as appropriate.   Please see review of systems for further details on the patient's review from today.   Objective:   Physical Exam:  BP 110/60   Pulse (!) 58   LMP 12/27/2007 (Exact Date)   Physical Exam Neurological:     Mental Status: She is alert and oriented to person, place, and time.     Cranial Nerves: No dysarthria.  Psychiatric:        Attention and Perception: Attention and perception normal.        Mood and Affect: Mood is anxious and depressed.        Speech: Speech normal.         Behavior: Behavior is cooperative.        Thought Content: Thought content normal. Thought content is not paranoid or delusional. Thought content does not include homicidal or suicidal ideation. Thought content does not include homicidal or suicidal plan.        Cognition and Memory: Cognition and memory normal.        Judgment: Judgment normal.     Comments: Insight intact Mood is less anxious and less depressed     Lab Review:     Component Value Date/Time   NA 137 11/28/2020 0733   K 4.0 11/28/2020 0733   CL 101 11/28/2020 0733   CO2 26 11/28/2020 0733   GLUCOSE 89 11/28/2020 0733   BUN 15 11/28/2020 0733   CREATININE 0.60 11/28/2020 0733   CREATININE 0.68 01/26/2020 0920   CALCIUM 9.8 11/28/2020 0733   PROT 6.9 11/28/2020 0733   ALBUMIN 4.6 11/28/2020 0733   AST 50 (H) 11/28/2020 0733   ALT 81 (H) 11/28/2020 0733   ALKPHOS 64 11/28/2020 0733   BILITOT 0.4 11/28/2020 0733   GFRNONAA >90 05/01/2014 1645   GFRAA >90 05/01/2014 1645       Component Value Date/Time   WBC 3.7 (L) 11/28/2020 0733   RBC 3.95 11/28/2020 0733   HGB 13.2 11/28/2020 0733   HGB 14.1 10/26/2014 1034   HCT 38.1 11/28/2020 0733   PLT 257.0 11/28/2020 0733   MCV 96.6 11/28/2020 0733   MCH 34.4 (H) 01/26/2020 0920   MCHC 34.6 11/28/2020 0733   RDW 12.4 11/28/2020 0733   LYMPHSABS 1.1 11/28/2020 0733   MONOABS 0.3 11/28/2020 0733   EOSABS 0.1 11/28/2020 0733   BASOSABS 0.1 11/28/2020 0733    No results found for: "POCLITH", "LITHIUM"   No results found for: "PHENYTOIN", "PHENOBARB", "VALPROATE", "CBMZ"   .res Assessment: Plan:   Pt seen for 25 minutes and time spent discussing potential benefits, risks, and side effects of increasing Pristiq to 150 mg po qd. Pt agrees to increase in Pristiq to 150 mg po qd for depression and anxiety.  Also discussed treatment options for ADHD since Concerta is no longer available  and Focalin XR is not as effective. Discussed alternative treatment options to  include Jornay PM and Azstarys. Pt agrees to trial of Azstarys. Will send script for Azstarys 52.3-10.4 mg daily for ADHD. Discussed that Azstarys may require a Prior Authorization. Discussed that she may take Focalin XR 20 mg BID if needed until Azstarys is available.  Will continue Gabapentin 300 mg po QHS for insomnia and anxiety.  Continue Gabapentin 100 mg 1-3 capsules as needed for insomnia.  Continue Rexulti 1 mg po qd for depression.  Continue Klonopin 0.5 mg 1/2-1 tab po QHS prn anxiety and insomnia.  Recommend continuing therapy with [name], [facility].  Pt to follow-up with this provider in 4 weeks or sooner if clinically indicated.  Patient advised to contact office with any questions, adverse effects, or acute worsening in signs and symptoms.   Delainie was seen today for depression and anxiety.  Diagnoses and all orders for this visit:  Attention deficit hyperactivity disorder (ADHD), predominantly inattentive type -     Serdexmethylphen-Dexmethylphen (AZSTARYS) 52.3-10.4 MG CAPS; Take 1 capsule by mouth every morning.  Insomnia, unspecified type  Anxiety -     desvenlafaxine (PRISTIQ) 50 MG 24 hr tablet; Take 1 tablet (50 mg total) by mouth daily. Take with a 100 mg tablet to equal total daily dose of 150 mg  Moderate episode of recurrent major depressive disorder (HCC) -     desvenlafaxine (PRISTIQ) 50 MG 24 hr tablet; Take 1 tablet (50 mg total) by mouth daily. Take with a 100 mg tablet to equal total daily dose of 150 mg     Please see After Visit Summary for patient specific instructions.  Future Appointments  Date Time Provider [place]  10/31/2021  8:00 AM [name], [facility] None  11/13/2021  8:00 AM [name], [facility] CP-CP None  11/19/2021  9:00 AM [name], PMHNP CP-CP None  11/26/2021  8:00 AM [name], [facility] CP-CP None  12/11/2021  8:00 AM [name], [facility] CP-CP None  12/16/2021  8:15 AM Megan Salon, MD [facility]   12/25/2021  8:00 AM [name], [facility] CP-CP None  01/08/2022  8:00 AM [name], [facility] CP-CP None  01/22/2022  8:00 AM [name], [facility] CP-CP None    No orders of the defined types were placed in this encounter.     -------------------------------

## 2021-10-31 ENCOUNTER — Ambulatory Visit: Payer: 59 | Admitting: Psychiatry

## 2021-10-31 NOTE — Telephone Encounter (Signed)
error 

## 2021-11-05 ENCOUNTER — Encounter: Payer: Self-pay | Admitting: Psychiatry

## 2021-11-05 ENCOUNTER — Telehealth (INDEPENDENT_AMBULATORY_CARE_PROVIDER_SITE_OTHER): Payer: 59 | Admitting: Psychiatry

## 2021-11-05 VITALS — BP 100/60 | HR 60

## 2021-11-05 DIAGNOSIS — F419 Anxiety disorder, unspecified: Secondary | ICD-10-CM | POA: Diagnosis not present

## 2021-11-05 DIAGNOSIS — F900 Attention-deficit hyperactivity disorder, predominantly inattentive type: Secondary | ICD-10-CM | POA: Diagnosis not present

## 2021-11-05 DIAGNOSIS — F332 Major depressive disorder, recurrent severe without psychotic features: Secondary | ICD-10-CM | POA: Diagnosis not present

## 2021-11-05 DIAGNOSIS — G4700 Insomnia, unspecified: Secondary | ICD-10-CM | POA: Diagnosis not present

## 2021-11-05 MED ORDER — DEXMETHYLPHENIDATE HCL ER 20 MG PO CP24: 20.0000 mg | ORAL_CAPSULE | Freq: Every morning | ORAL | 0 refills | Status: DC

## 2021-11-05 MED ORDER — DEXMETHYLPHENIDATE HCL ER 10 MG PO CP24: 10.0000 mg | ORAL_CAPSULE | Freq: Every day | ORAL | 0 refills | Status: DC

## 2021-11-05 MED ORDER — CARIPRAZINE HCL 1.5 MG PO CAPS: 1.5000 mg | ORAL_CAPSULE | Freq: Every day | ORAL | 1 refills | Status: DC

## 2021-11-05 MED ORDER — CLONAZEPAM 0.5 MG PO TABS: 0.5000 mg | ORAL_TABLET | Freq: Every day | ORAL | 2 refills | Status: DC

## 2021-11-05 MED ORDER — GABAPENTIN 300 MG PO CAPS: 300.0000 mg | ORAL_CAPSULE | Freq: Every day | ORAL | 1 refills | Status: DC

## 2021-11-05 NOTE — Progress Notes (Signed)
Heather Davila [id number] 1960/10/25 61 y.o.  Virtual Visit via Video Note  I connected with pt @ on 11/05/21 at  1:00 PM EDT by a video enabled telemedicine application and verified that I am speaking with the correct person using two identifiers.   I discussed the limitations of evaluation and management by telemedicine and the availability of in person appointments. The patient expressed understanding and agreed to proceed.  I discussed the assessment and treatment plan with the patient. The patient was provided an opportunity to ask questions and all were answered. The patient agreed with the plan and demonstrated an understanding of the instructions.   The patient was advised to call back or seek an in-person evaluation if the symptoms worsen or if the condition fails to improve as anticipated.  I provided 25 minutes of non-face-to-face time during this encounter.  The patient was located at work.  The provider was located at [place].   [name], PMHNP   Subjective:   Patient ID:  Heather Davila is a 61 y.o. (DOB 08-Apr-1961) female.  Chief Complaint:  Chief Complaint  Patient presents with   Depression   Follow-up    Anxiety, insomnia, ADHD    HPI Heather Davila presents for follow-up of depression, anxiety, insomnia, and ADHD. She reports, "I feel like the medication isn't working that great." She reports that she felt more anxious on Pristiq 150 mg and resumed 100 mg daily. She reports persistent sad mood. Energy and motivation have been low. Diminished enjoyment in things. Denies withdrawing socially. Anxiety has improved on Pristiq 100 mg. Denies anxiety other than in response to mother having health issues. Sleep is adequate with Gabapentin. Appetite has been ok. Concentration has been "decent." She reports that Dexmethylphenidate 20 mg in the morning and 10 mg in the afternoon has been most effective. Denies SI.   Her mother has liver cancer and has had a  recent decline in her health. Just returned from visiting her in [place] and plans to return to [place] soon.   Past Psychiatric Medication Trials: Trintellix - Initially effective and then was not effective when re-started Paxil- Ineffective. Helped initially. Prozac-Insomnia Lexapro- Effective and then no longer as effective Zoloft- Initially effective and then not as effective.  Viibryd Cymbalta- Ineffective. Helped initially. Effexor XR- Effective, well tolerated. Pristiq- Increased anxiety at 150 mg daily Wellbutrin XL-Ineffective.May have increased anxiety. Amitriptyline Nortriptyline-Caused irritability, constipation Auvelity- ineffective Abilify- Helpful but caused severe insomnia. Rexulti-Helpful but caused insomnia. Seroquel - Ineffective Risperdal Olanzapine- Ineffective Klonopin- Effective Temazepam- Took during menopause Lunesta- Ineffective Ambien-Ineffective Sonata- Ineffective Trazodone- Ineffective Remeron-Ineffective. Does not recall wt gain.  Adderall- Took once and had adverse effect. Concerta- Effective Metadate-Not as effective as Concerta Dexmethylphenidate- Not as effective as Concerta Azstarys- some side effects. Not as effective.  Lamictal- May have had an adverse effects. "Felt weird." Reports worsening s/s.  Lithium Gabapentin    Review of Systems:  Review of Systems  Cardiovascular:  Negative for palpitations.  Musculoskeletal:  Negative for gait problem.  Neurological:  Negative for tremors.  Psychiatric/Behavioral:         Please refer to HPI    Medications: I have reviewed the patient's current medications.  Current Outpatient Medications  Medication Sig Dispense Refill   cariprazine (VRAYLAR) 1.5 MG capsule Take 1 capsule (1.5 mg total) by mouth daily. 30 capsule 1   desvenlafaxine (PRISTIQ) 100 MG 24 hr tablet TAKE 1 TABLET BY MOUTH EVERY DAY 90 tablet 0   dexmethylphenidate (FOCALIN XR)  10 MG 24 hr capsule Take 1 capsule (10 mg total) by  mouth daily in the afternoon. 30 capsule 0   doxycycline (VIBRAMYCIN) 50 MG capsule Take 50 mg by mouth daily.     estradiol (ESTRACE VAGINAL) 0.1 MG/GM vaginal cream 1 gram pv twice weekly at bedtime 42.5 g 3   gabapentin (NEURONTIN) 100 MG capsule Take 1-3 capsules as needed 270 capsule 1   ibuprofen (ADVIL,MOTRIN) 200 MG tablet Take 200 mg by mouth every 6 (six) hours as needed.     MAGNESIUM PO Take by mouth.     Multiple Vitamin (MULTIVITAMIN) tablet Take 1 tablet by mouth daily.     TURMERIC PO Take by mouth.     valACYclovir (VALTREX) 500 MG tablet Take one twice daily for 3 days with any symptoms 30 tablet 3   clonazePAM (KLONOPIN) 0.5 MG tablet Take 1 tablet (0.5 mg total) by mouth at bedtime. 30 tablet 2   dexmethylphenidate (FOCALIN XR) 20 MG 24 hr capsule Take 1 capsule (20 mg total) by mouth every morning. 30 capsule 0   gabapentin (NEURONTIN) 300 MG capsule Take 1 capsule (300 mg total) by mouth at bedtime. 90 capsule 1   No current facility-administered medications for this visit.    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   TRANSAMINASES, SERUM, ELEVATED 08/14/2009    Family History  Problem Relation Age of Onset   Cancer Mother 3       colon cancer   Hypertension Mother    Colon cancer Mother 43       colon rescection   Colon polyps Mother    Hypertension Father    Heart disease Father 53       CABG62   Stroke Father 25   Heart disease Sister        atrial fibrillation   Heart attack Sister 25       no stents   Depression Sister    Heart failure Paternal Aunt    Diabetes  Paternal Uncle    Heart disease Paternal Grandfather    Esophageal cancer Neg Hx    Rectal cancer Neg Hx    Stomach cancer Neg Hx     Social History   Socioeconomic History   Marital status: Married    Spouse name: Charles   Number of children: 3   Years of education: Master's   Highest education level: Not on file  Occupational History   Occupation: Optician, dispensing: [employer]  Tobacco Use   Smoking status: Former    Packs/day: 0.50    Years: 5.00    Total pack years: 2.50    Types: Cigarettes    Quit date: 07/10/1987    Years since quitting: 34.3   Smokeless tobacco: Never  Vaping Use   Vaping Use: Never used  Substance and Sexual Activity   Alcohol use: No   Drug use: No   Sexual activity: Yes    Partners: Male    Birth control/protection: Post-menopausal,  Other-see comments    Comment: vasectomy  Other Topics Concern   Not on file  Social History Narrative   Patient is married [name]) and lives at home with her husband and two children.   Patient has three children.   Patient works at [employer].   Patient has a Master's degree.   Patient is left-handed.   Patient drinks 6 cups of coffee daily.   Social Determinants of Health   Financial Resource Strain: Not on file  Food Insecurity: Not on file  Transportation Needs: Not on file  Physical Activity: Not on file  Stress: Not on file  Social Connections: Not on file  Intimate Partner Violence: Not on file    Past Medical History, Surgical history, Social history, and Family history were reviewed and updated as appropriate.   Please see review of systems for further details on the patient's review from today.   Objective:   Physical Exam:  BP 100/60   Pulse 60   LMP 12/27/2007 (Exact Date)   Physical Exam Neurological:     Mental Status: She is alert and oriented to person, place, and time.     Cranial Nerves: No dysarthria.  Psychiatric:        Attention and  Perception: Attention and perception normal.        Mood and Affect: Mood is depressed.        Speech: Speech normal.        Behavior: Behavior is cooperative.        Thought Content: Thought content normal. Thought content is not paranoid or delusional. Thought content does not include homicidal or suicidal ideation. Thought content does not include homicidal or suicidal plan.        Cognition and Memory: Cognition and memory normal.        Judgment: Judgment normal.     Comments: Insight intact     Lab Review:     Component Value Date/Time   NA 137 11/28/2020 0733   K 4.0 11/28/2020 0733   CL 101 11/28/2020 0733   CO2 26 11/28/2020 0733   GLUCOSE 89 11/28/2020 0733   BUN 15 11/28/2020 0733   CREATININE 0.60 11/28/2020 0733   CREATININE 0.68 01/26/2020 0920   CALCIUM 9.8 11/28/2020 0733   PROT 6.9 11/28/2020 0733   ALBUMIN 4.6 11/28/2020 0733   AST 50 (H) 11/28/2020 0733   ALT 81 (H) 11/28/2020 0733   ALKPHOS 64 11/28/2020 0733   BILITOT 0.4 11/28/2020 0733   GFRNONAA >90 05/01/2014 1645   GFRAA >90 05/01/2014 1645       Component Value Date/Time   WBC 3.7 (L) 11/28/2020 0733   RBC 3.95 11/28/2020 0733   HGB 13.2 11/28/2020 0733   HGB 14.1 10/26/2014 1034   HCT 38.1 11/28/2020 0733   PLT 257.0 11/28/2020 0733   MCV 96.6 11/28/2020 0733   MCH 34.4 (H) 01/26/2020 0920   MCHC 34.6 11/28/2020 0733   RDW 12.4 11/28/2020 0733   LYMPHSABS 1.1 11/28/2020 0733   MONOABS 0.3 11/28/2020 0733   EOSABS 0.1 11/28/2020 0733   BASOSABS 0.1 11/28/2020 0733    No results found for: "POCLITH", "LITHIUM"   No results found for: "PHENYTOIN", "PHENOBARB", "VALPROATE", "CBMZ"   .res Assessment: Plan:    Pt seen for 25 minutes and time spent discussing treatment options for depression. Discussed that Arman Filter has recently been approved for augmentation of major depression and is a partial dopamine agonist, similar to Abilify and Rexulti, which  have been somewhat helpful for her  depressive symptoms at times. Discussed option of discontinuing Rexulti and starting Vraylar for depression since she has recently felt that Rexulti is no longer as effective for her depression. Pt agrees to trial of Vraylar. Will start Vraylar 1.5 mg po qd for depression.  Will discontinue Rexulti due to switching to Vraylar.  Will continue Pristiq 100 mg po qd since she had increased anxiety at 150 mg po qd.  She reports that Focalin XR 20 mg in the morning and 10 mg in the afternoon seems to be effective and well tolerated. Scripts sent.  Continue Klonopin 0.5 mg po QHS for insomnia.  Continue Gabapentin 300 mg po QHS for insomnia and 100 mg 1-3 capsules as needed for intermittent awakenings.  Pt to follow-up with this provider in 4 weeks or sooner if clinically indicated.  Patient advised to contact office with any questions, adverse effects, or acute worsening in signs and symptoms.    Yehudit was seen today for depression and follow-up.  Diagnoses and all orders for this visit:  Severe episode of recurrent major depressive disorder, without psychotic features ([place]) -     cariprazine (VRAYLAR) 1.5 MG capsule; Take 1 capsule (1.5 mg total) by mouth daily.  Attention deficit hyperactivity disorder (ADHD), predominantly inattentive type -     dexmethylphenidate (FOCALIN XR) 20 MG 24 hr capsule; Take 1 capsule (20 mg total) by mouth every morning. -     dexmethylphenidate (FOCALIN XR) 10 MG 24 hr capsule; Take 1 capsule (10 mg total) by mouth daily in the afternoon.  Anxiety -     clonazePAM (KLONOPIN) 0.5 MG tablet; Take 1 tablet (0.5 mg total) by mouth at bedtime. -     gabapentin (NEURONTIN) 300 MG capsule; Take 1 capsule (300 mg total) by mouth at bedtime.  Insomnia, unspecified type -     clonazePAM (KLONOPIN) 0.5 MG tablet; Take 1 tablet (0.5 mg total) by mouth at bedtime. -     gabapentin (NEURONTIN) 300 MG capsule; Take 1 capsule (300 mg total) by mouth at bedtime.     Please  see After Visit Summary for patient specific instructions.  Future Appointments  Date Time Provider [place]  12/05/2021  8:30 AM [name], PMHNP CP-CP None  12/16/2021  8:15 AM Megan Salon, MD [facility]    No orders of the defined types were placed in this encounter.     -------------------------------

## 2021-11-12 ENCOUNTER — Other Ambulatory Visit: Payer: Self-pay

## 2021-11-12 ENCOUNTER — Telehealth: Payer: Self-pay | Admitting: Psychiatry

## 2021-11-12 MED ORDER — AZSTARYS 52.3-10.4 MG PO CAPS: 1.0000 | ORAL_CAPSULE | Freq: Every day | ORAL | 0 refills | Status: DC

## 2021-11-12 NOTE — Telephone Encounter (Signed)
Ok to pend.

## 2021-11-12 NOTE — Telephone Encounter (Signed)
Pended.

## 2021-11-12 NOTE — Telephone Encounter (Signed)
Pt called [facility] on back order. Pt requesting Rx for AZSTARYS 52.3-10.4. Stated she only tried for 2 days prior. Since then, she has tried and works well. [facility] [place]. Apt 8/10.

## 2021-11-13 ENCOUNTER — Ambulatory Visit: Payer: 59 | Admitting: Psychiatry

## 2021-11-13 ENCOUNTER — Telehealth: Payer: Self-pay | Admitting: Psychiatry

## 2021-11-13 NOTE — Telephone Encounter (Signed)
Pt called reporting her mom is in the last weeks of life. She stays with mom during the day and when she goes home she can not go to sleep. Asking for some type of sleep aid to help get some rest. [facility]. Contact Pt @ [phone] if needed.

## 2021-11-13 NOTE — Telephone Encounter (Signed)
Recommend that she try taking two of the Gabapentin 300 mg at bedtime.

## 2021-11-13 NOTE — Telephone Encounter (Signed)
I called patient. She said the clonazepam is not helping. She said the gabapentin had previously been helpful with sleep, but is not helpful now. Denies caffeine use late in the day. Focalin has been on backorder. She just started the new stimulant yesterday. She said she is having difficulty getting to sleep and staying asleep. She is relating it to the current situation and wonders if she just needs to get thru it with current meds.

## 2021-11-13 NOTE — Telephone Encounter (Signed)
Please review

## 2021-11-14 NOTE — Telephone Encounter (Signed)
Patient notified of recommendations. Told her if it was beneficial to let us know so we could send in new Rx.

## 2021-11-19 ENCOUNTER — Telehealth: Payer: 59 | Admitting: Psychiatry

## 2021-11-22 ENCOUNTER — Telehealth: Payer: Self-pay | Admitting: Psychiatry

## 2021-11-22 DIAGNOSIS — F419 Anxiety disorder, unspecified: Secondary | ICD-10-CM

## 2021-11-22 DIAGNOSIS — G4700 Insomnia, unspecified: Secondary | ICD-10-CM

## 2021-11-22 MED ORDER — GABAPENTIN 600 MG PO TABS: 600.0000 mg | ORAL_TABLET | Freq: Every day | ORAL | 1 refills | Status: DC

## 2021-11-22 NOTE — Telephone Encounter (Signed)
Script sent  

## 2021-11-22 NOTE — Telephone Encounter (Addendum)
Patient has been taking two 300-mg gabapentin and she said it is helpful. She needs a new Rx because she is going to run out sooner.  See 7/19 note. Pending new Rx for your disposition. Pended 600 mg qhs.

## 2021-11-22 NOTE — Telephone Encounter (Signed)
Heather Davila called requesting a new Rx on the Gabapentin 300 mg. She stated due to dosage adjustments per [name] she ran out early. A refill is also requested on the 100 mg .  Fill at [id number] [place], [place] - [address]  [address] [id number] Phone:  [phone] Fax:  [phone]  Next appointment : 12/02/21

## 2021-11-26 ENCOUNTER — Telehealth (INDEPENDENT_AMBULATORY_CARE_PROVIDER_SITE_OTHER): Payer: 59 | Admitting: Psychiatry

## 2021-11-26 ENCOUNTER — Ambulatory Visit: Payer: 59 | Admitting: Psychiatry

## 2021-11-26 ENCOUNTER — Encounter: Payer: Self-pay | Admitting: Psychiatry

## 2021-11-26 DIAGNOSIS — G4700 Insomnia, unspecified: Secondary | ICD-10-CM

## 2021-11-26 DIAGNOSIS — F331 Major depressive disorder, recurrent, moderate: Secondary | ICD-10-CM | POA: Diagnosis not present

## 2021-11-26 DIAGNOSIS — F900 Attention-deficit hyperactivity disorder, predominantly inattentive type: Secondary | ICD-10-CM

## 2021-11-26 DIAGNOSIS — F419 Anxiety disorder, unspecified: Secondary | ICD-10-CM

## 2021-11-26 MED ORDER — DESVENLAFAXINE SUCCINATE ER 100 MG PO TB24: 100.0000 mg | ORAL_TABLET | Freq: Every day | ORAL | 0 refills | Status: DC

## 2021-11-26 MED ORDER — DAYVIGO 5 MG PO TABS: 5.0000 mg | ORAL_TABLET | Freq: Every day | ORAL | 0 refills | Status: DC

## 2021-11-26 MED ORDER — DAYVIGO 10 MG PO TABS: ORAL_TABLET | ORAL | 1 refills | Status: DC

## 2021-11-26 MED ORDER — AZSTARYS 52.3-10.4 MG PO CAPS: 1.0000 | ORAL_CAPSULE | Freq: Every day | ORAL | 0 refills | Status: DC

## 2021-11-26 NOTE — Progress Notes (Signed)
Heather Davila [id number] Oct 09, 1960 61 y.o.  Virtual Visit via Video Note  I connected with pt @ on 11/26/21 at  1:45 PM EDT by a video enabled telemedicine application and verified that I am speaking with the correct person using two identifiers.   I discussed the limitations of evaluation and management by telemedicine and the availability of in person appointments. The patient expressed understanding and agreed to proceed.  I discussed the assessment and treatment plan with the patient. The patient was provided an opportunity to ask questions and all were answered. The patient agreed with the plan and demonstrated an understanding of the instructions.   The patient was advised to call back or seek an in-person evaluation if the symptoms worsen or if the condition fails to improve as anticipated.  I provided 25 minutes of non-face-to-face time during this encounter.  The patient was located in her personal vehicle in [place].  The provider was located at [place].   [name], PMHNP   Subjective:   Patient ID:  Heather Davila is a 61 y.o. (DOB Oct 14, 1960) female.  Chief Complaint:  Chief Complaint  Patient presents with   Insomnia   Anxiety   Follow-up    Depression    HPI Heather Davila presents for follow-up of depression, anxiety, ADHD, and insomnia.   She is going to [place] tomorrow to care for her mother. She reports that her mother is coming home from the hospital tomorrow and she plans to care for her mother until her mother passes.   She has been having difficulty falling asleep. Has been taking Gabapentin 700 mg po QHS. She reports that she is also taking Klonopin at bedtime  Has been sleeping better over the last week while not caring for her mother.   Anxiety has been higher. She reports that Arman Filter seems to be helping some with depression. She reports improved energy and motivation. Appetite has not changed. She reports that at times she has not eaten  as much due to staying with her mother in the hospital. She reports that Azstarys has been working ok. Denies SI.   Reports that she has 2 sisters that live nearby, however they are not willing to provide hands on care. Mother has been "kind of difficult" and does not want others coming into her home other than her daughters.   She will be out of work on [employer].   Past Psychiatric Medication Trials: Trintellix - Initially effective and then was not effective when re-started Paxil- Ineffective. Helped initially. Prozac-Insomnia Lexapro- Effective and then no longer as effective Zoloft- Initially effective and then not as effective.  Viibryd Cymbalta- Ineffective. Helped initially. Effexor XR- Effective, well tolerated. Pristiq- Increased anxiety at 150 mg daily Wellbutrin XL-Ineffective.May have increased anxiety. Amitriptyline Nortriptyline-Caused irritability, constipation Auvelity- ineffective Abilify- Helpful but caused severe insomnia. Rexulti-Helpful but caused insomnia. Vraylar Seroquel - Ineffective Risperdal Olanzapine- Ineffective Klonopin- Effective Temazepam- Took during menopause Lunesta- Ineffective Ambien-Ineffective Sonata- Ineffective Trazodone- Ineffective Remeron-Ineffective. Does not recall wt gain.  Adderall- Took once and had adverse effect. Concerta- Effective Metadate-Not as effective as Concerta Dexmethylphenidate- Not as effective as Concerta Azstarys- some side effects. Not as effective.  Lamictal- May have had an adverse effects. "Felt weird." Reports worsening s/s.  Lithium Gabapentin  Review of Systems:  Review of Systems  Musculoskeletal:  Negative for gait problem.  Neurological:  Negative for tremors.  Psychiatric/Behavioral:         Please refer to HPI    Medications: I have  reviewed the patient's current medications.  Current Outpatient Medications  Medication Sig Dispense Refill   Lemborexant (DAYVIGO) 10 MG TABS Take 1/2-1 tab po  QHS 30 tablet 1   Lemborexant (DAYVIGO) 5 MG TABS Take 5 mg by mouth at bedtime. Can increase to 10 mg po QHS after 3-5 days if 5 mg is ineffective 10 tablet 0   cariprazine (VRAYLAR) 1.5 MG capsule Take 1 capsule (1.5 mg total) by mouth daily. 30 capsule 1   clonazePAM (KLONOPIN) 0.5 MG tablet Take 1 tablet (0.5 mg total) by mouth at bedtime. 30 tablet 2   desvenlafaxine (PRISTIQ) 100 MG 24 hr tablet Take 1 tablet (100 mg total) by mouth daily. 90 tablet 0   doxycycline (VIBRAMYCIN) 50 MG capsule Take 50 mg by mouth daily.     estradiol (ESTRACE VAGINAL) 0.1 MG/GM vaginal cream 1 gram pv twice weekly at bedtime 42.5 g 3   gabapentin (NEURONTIN) 100 MG capsule Take 1-3 capsules as needed 270 capsule 1   gabapentin (NEURONTIN) 600 MG tablet Take 1 tablet (600 mg total) by mouth at bedtime. 30 tablet 1   ibuprofen (ADVIL,MOTRIN) 200 MG tablet Take 200 mg by mouth every 6 (six) hours as needed.     MAGNESIUM PO Take by mouth.     Multiple Vitamin (MULTIVITAMIN) tablet Take 1 tablet by mouth daily.     [START ON 12/10/2021] Serdexmethylphen-Dexmethylphen (AZSTARYS) 52.3-10.4 MG CAPS Take 1 capsule by mouth daily. 30 capsule 0   TURMERIC PO Take by mouth.     valACYclovir (VALTREX) 500 MG tablet Take one twice daily for 3 days with any symptoms 30 tablet 3   No current facility-administered medications for this visit.    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   TRANSAMINASES, SERUM, ELEVATED 08/14/2009    Family History  Problem Relation Age of Onset   Cancer Mother 63       colon cancer   Hypertension Mother     Colon cancer Mother 64       colon rescection   Colon polyps Mother    Hypertension Father    Heart disease Father 74       CABG62   Stroke Father 47   Heart disease Sister        atrial fibrillation   Heart attack Sister 28       no stents   Depression Sister    Heart failure Paternal Aunt    Diabetes Paternal Uncle    Heart disease Paternal Grandfather    Esophageal cancer Neg Hx    Rectal cancer Neg Hx    Stomach cancer Neg Hx     Social History   Socioeconomic History   Marital status: Married    Spouse name: [name]   Number of children: 3   Years of education: Master's   Highest education level: Not on file  Occupational History   Occupation: Optician, dispensing: [employer]  Tobacco Use   Smoking status: Former  Packs/day: 0.50    Years: 5.00    Total pack years: 2.50    Types: Cigarettes    Quit date: 07/10/1987    Years since quitting: 34.4   Smokeless tobacco: Never  Vaping Use   Vaping Use: Never used  Substance and Sexual Activity   Alcohol use: No   Drug use: No   Sexual activity: Yes    Partners: Male    Birth control/protection: Post-menopausal, Other-see comments    Comment: vasectomy  Other Topics Concern   Not on file  Social History Narrative   Patient is married Tax inspector) and lives at home with her husband and two children.   Patient has three children.   Patient works at [employer].   Patient has a Master's degree.   Patient is left-handed.   Patient drinks 6 cups of coffee daily.   Social Determinants of Health   Financial Resource Strain: Not on file  Food Insecurity: Not on file  Transportation Needs: Not on file  Physical Activity: Not on file  Stress: Not on file  Social Connections: Not on file  Intimate Partner Violence: Not on file    Past Medical History, Surgical history, Social history, and Family history were reviewed and updated as appropriate.   Please see review of systems for further  details on the patient's review from today.   Objective:   Physical Exam:  LMP 12/27/2007 (Exact Date)   Physical Exam Neurological:     Mental Status: She is alert and oriented to person, place, and time.     Cranial Nerves: No dysarthria.  Psychiatric:        Attention and Perception: Attention and perception normal.        Mood and Affect: Mood is anxious.        Speech: Speech normal.        Behavior: Behavior is cooperative.        Thought Content: Thought content normal. Thought content is not paranoid or delusional. Thought content does not include homicidal or suicidal ideation. Thought content does not include homicidal or suicidal plan.        Cognition and Memory: Cognition and memory normal.        Judgment: Judgment normal.     Comments: Insight intact She reports mood is less depressed since starting Vraylar.      Lab Review:     Component Value Date/Time   NA 137 11/28/2020 0733   K 4.0 11/28/2020 0733   CL 101 11/28/2020 0733   CO2 26 11/28/2020 0733   GLUCOSE 89 11/28/2020 0733   BUN 15 11/28/2020 0733   CREATININE 0.60 11/28/2020 0733   CREATININE 0.68 01/26/2020 0920   CALCIUM 9.8 11/28/2020 0733   PROT 6.9 11/28/2020 0733   ALBUMIN 4.6 11/28/2020 0733   AST 50 (H) 11/28/2020 0733   ALT 81 (H) 11/28/2020 0733   ALKPHOS 64 11/28/2020 0733   BILITOT 0.4 11/28/2020 0733   GFRNONAA >90 05/01/2014 1645   GFRAA >90 05/01/2014 1645       Component Value Date/Time   WBC 3.7 (L) 11/28/2020 0733   RBC 3.95 11/28/2020 0733   HGB 13.2 11/28/2020 0733   HGB 14.1 10/26/2014 1034   HCT 38.1 11/28/2020 0733   PLT 257.0 11/28/2020 0733   MCV 96.6 11/28/2020 0733   MCH 34.4 (H) 01/26/2020 0920   MCHC 34.6 11/28/2020 0733   RDW 12.4 11/28/2020 0733   LYMPHSABS 1.1 11/28/2020 0733   MONOABS  0.3 11/28/2020 0733   EOSABS 0.1 11/28/2020 0733   BASOSABS 0.1 11/28/2020 0733    No results found for: "POCLITH", "LITHIUM"   No results found for: "PHENYTOIN",  "PHENOBARB", "VALPROATE", "CBMZ"   .res Assessment: Plan:    Pt seen for 25 minutes and time spent discussing response to Vraylar and possible treatment options for insomnia. She reports that she had difficulty coming off sleep medication in the past and is apprehensive about re-starting a sleep medication. Discussed orexin antagonists as a possible option due to not being associated with dependence and tolerance. Discussed potential benefits, risks, and and side effects of Dayvigo for insomnia.  Patient agrees to trial of Dayvigo. Discussed free trial offer of Dayvigo from website and script sent to pharmacy for 10-day free trial.  Patient advised to start Dayvigo 5 mg one tablet by mouth at bedtime, and to increase to 2 tabs after 3 to 5 days if 5 mg dose is ineffective. Will also send script for Dayvigo 10 mg po QHS for insomnia.  Continue Vraylar 1.5 mg po qd for depression.  Continue Klonopin 0.5 mg po QHS for insomnia.  Continue Pristiq 100 mg po qd for depression and anxiety.  Continue Gabapentin 600 mg po QHS for insomnia.  Continue Gabapentin 100 mg 1-2 capsules as needed for intermittent awakenings.  Continue azstarys 52.3-10.4 mg po q am for ADHD.  Pt to follow-up with this provider in 4 weeks or sooner if clinically indicated.  Patient advised to contact office with any questions, adverse effects, or acute worsening in signs and symptoms.   Regine was seen today for insomnia, anxiety and follow-up.  Diagnoses and all orders for this visit:  Moderate episode of recurrent major depressive disorder (HCC) -     desvenlafaxine (PRISTIQ) 100 MG 24 hr tablet; Take 1 tablet (100 mg total) by mouth daily.  Insomnia, unspecified type -     Lemborexant (DAYVIGO) 10 MG TABS; Take 1/2-1 tab po QHS -     Lemborexant (DAYVIGO) 5 MG TABS; Take 5 mg by mouth at bedtime. Can increase to 10 mg po QHS after 3-5 days if 5 mg is ineffective  Attention deficit hyperactivity disorder (ADHD),  predominantly inattentive type -     Serdexmethylphen-Dexmethylphen (AZSTARYS) 52.3-10.4 MG CAPS; Take 1 capsule by mouth daily.  Anxiety     Please see After Visit Summary for patient specific instructions.  Future Appointments  Date Time Provider [place]  12/16/2021  8:15 AM Megan Salon, MD [facility]    No orders of the defined types were placed in this encounter.     -------------------------------

## 2021-11-27 ENCOUNTER — Telehealth: Payer: Self-pay

## 2021-11-27 NOTE — Telephone Encounter (Signed)
Prior Authorization initiated for [place] 10 MG with [facility] sent urgently due to pt going out of town Wednesday 8/02, approval received effective 10/28/2021-11/27/2022 PA# [id number]

## 2021-12-02 ENCOUNTER — Telehealth: Payer: 59 | Admitting: Psychiatry

## 2021-12-05 ENCOUNTER — Telehealth: Payer: 59 | Admitting: Psychiatry

## 2021-12-10 ENCOUNTER — Encounter: Payer: Self-pay | Admitting: Physician Assistant

## 2021-12-10 ENCOUNTER — Ambulatory Visit: Payer: Managed Care, Other (non HMO) | Admitting: Physician Assistant

## 2021-12-10 DIAGNOSIS — T8543XA Leakage of breast prosthesis and implant, initial encounter: Secondary | ICD-10-CM

## 2021-12-10 NOTE — Progress Notes (Addendum)
   Referring Provider Heather Post, MD [place],  [place] [address]   CC:  Chief Complaint  Patient presents with   Follow-up      Heather Davila is an 61 y.o. female.   HPI: This is a pleasant 61 year old female seen in our office for evaluation of ruptured right saline implant.  The patient is known to our practice having previously been seen for hyperpigmentation of the face.  She notes that in approximately 2003 she had bilateral saline implants placed in [place] by Dr. Teena Davila.  She notes prior to this she was a cup size a and postoperatively she is a cup size C but does not have any further information regarding the implants that were placed.  She notes she had no issues or concerns with them until several weeks ago when she was trying to move a hospital bed and felt a pop in the right breast, she looked down and saw that the implant had ruptured.  She notes some irritation but denies any significant pain, she denies any issues with the left-sided implant.  She denies any history of smoking, she notes her most recent mammogram was in September 2022 with no significant abnormalities.  She does not take any antiplatelets or anticoagulants.  Patient notes that she would like to be a "small C" cup.  Review of Systems General: Negative for fever  Physical Exam    11/05/2021    1:26 PM 10/08/2021    9:57 AM 03/04/2021    9:28 AM  Vitals with BMI  Systolic     Diastolic     Pulse        Information is confidential and restricted. Go to Review Flowsheets to unlock data.    General:  No acute distress,  Alert and oriented, Non-Toxic, Normal speech and affect Pulmonary : lung expansion normal, speaking in full sentences Cardiovascular: Regular rate Breasts: Right breast deflated, no redness         Assessment/Plan This is a 61 year old female seen in our office for evaluation of ruptured right saline implant.  Dr. Marla Davila had the opportunity to  personally evaluate the patient.  Given the obvious rupture we would recommend operative intervention and replacement of bilateral implants.  The patient will be calling her previous surgeons office to obtain records including implants that were originally placed.  When she obtains this information she will reach out to our office and let us know so that we may start the approval process for surgery.   Heather Davila Heather Davila 12/10/2021, 2:02 PM   Likely Allergan 68 350 cc saline implants

## 2021-12-11 ENCOUNTER — Ambulatory Visit: Payer: 59 | Admitting: Psychiatry

## 2021-12-11 ENCOUNTER — Ambulatory Visit: Payer: Managed Care, Other (non HMO) | Admitting: Surgical

## 2021-12-16 ENCOUNTER — Telehealth: Payer: Self-pay | Admitting: Plastic Surgery

## 2021-12-16 ENCOUNTER — Ambulatory Visit (HOSPITAL_BASED_OUTPATIENT_CLINIC_OR_DEPARTMENT_OTHER): Payer: Managed Care, Other (non HMO) | Admitting: Obstetrics & Gynecology

## 2021-12-16 NOTE — Telephone Encounter (Signed)
Patient wants to know if her implants came in yet; requesting call back.  Please advise at [phone].

## 2021-12-17 ENCOUNTER — Encounter: Payer: Self-pay | Admitting: *Deleted

## 2021-12-17 NOTE — Telephone Encounter (Signed)
Returned call to Ms. Heather Davila. Reviewed quote for removal of bilateral breast implants and replacement with Mentor silicone implants. She is agreeable to quote and states would like to schedule ASAP. Copy of quote sent to pt via mychart.

## 2021-12-19 ENCOUNTER — Telehealth: Payer: Self-pay | Admitting: Plastic Surgery

## 2021-12-19 NOTE — Telephone Encounter (Signed)
Pt is calling in to see if she can go down a size instead of the small "C" cup that was ordered.  Pt would like to have a call to discuss this before her surgery and was wondering if it has already been ordered if there is a charge to change the size if so she would just like to keep it at the size that was originally ordered.

## 2021-12-20 ENCOUNTER — Telehealth: Payer: Self-pay | Admitting: Plastic Surgery

## 2021-12-20 ENCOUNTER — Telehealth: Payer: Self-pay | Admitting: *Deleted

## 2021-12-20 NOTE — Telephone Encounter (Signed)
Pt has question about cup size order of her implants. She was wondering about Full size B to a small C.  She would like a call back.

## 2021-12-20 NOTE — Telephone Encounter (Signed)
Returned pt's call re: implant size. Explained that multiple sizes of implants are ordered with each case so the doctor can fit them according to what is needed. Also that I had forwarded her message from yesterday to Dr. Marla Roe and she can address the size issue during her pre-op appointment. Pt verbalized understanding

## 2021-12-23 ENCOUNTER — Telehealth: Payer: Self-pay | Admitting: *Deleted

## 2021-12-23 NOTE — Telephone Encounter (Signed)
Called and spoke with the patient and informed her that I have not been able to get a fax to go through to request records at  Dr. Holland Falling office.    I have call Dr. Holland Falling office and informed them of this, and I asked them if I could just mail the record release form to them.  They stated that would be fine to mail the record release form.    Mailed Record release form mailed to Dr. Louie Casa [phone] [facility] Dr. Jeanmarie Hubert [id number].//AB/CMA

## 2021-12-25 ENCOUNTER — Ambulatory Visit: Payer: 59 | Admitting: Psychiatry

## 2021-12-31 ENCOUNTER — Other Ambulatory Visit: Payer: Self-pay | Admitting: Psychiatry

## 2021-12-31 DIAGNOSIS — F332 Major depressive disorder, recurrent severe without psychotic features: Secondary | ICD-10-CM

## 2022-01-01 ENCOUNTER — Telehealth: Payer: 59 | Admitting: Psychiatry

## 2022-01-03 ENCOUNTER — Telehealth (INDEPENDENT_AMBULATORY_CARE_PROVIDER_SITE_OTHER): Payer: 59 | Admitting: Psychiatry

## 2022-01-03 ENCOUNTER — Encounter: Payer: Self-pay | Admitting: Psychiatry

## 2022-01-03 ENCOUNTER — Other Ambulatory Visit: Payer: Self-pay | Admitting: Psychiatry

## 2022-01-03 VITALS — BP 110/60 | HR 60

## 2022-01-03 DIAGNOSIS — F330 Major depressive disorder, recurrent, mild: Secondary | ICD-10-CM

## 2022-01-03 DIAGNOSIS — F900 Attention-deficit hyperactivity disorder, predominantly inattentive type: Secondary | ICD-10-CM | POA: Diagnosis not present

## 2022-01-03 DIAGNOSIS — F331 Major depressive disorder, recurrent, moderate: Secondary | ICD-10-CM

## 2022-01-03 DIAGNOSIS — F419 Anxiety disorder, unspecified: Secondary | ICD-10-CM | POA: Diagnosis not present

## 2022-01-03 DIAGNOSIS — G4700 Insomnia, unspecified: Secondary | ICD-10-CM

## 2022-01-03 MED ORDER — GABAPENTIN 100 MG PO CAPS: ORAL_CAPSULE | ORAL | 1 refills | Status: DC

## 2022-01-03 MED ORDER — CARIPRAZINE HCL 1.5 MG PO CAPS: 1.5000 mg | ORAL_CAPSULE | Freq: Every day | ORAL | 0 refills | Status: DC

## 2022-01-03 MED ORDER — GABAPENTIN 800 MG PO TABS: 800.0000 mg | ORAL_TABLET | Freq: Every day | ORAL | 1 refills | Status: DC

## 2022-01-03 MED ORDER — AZSTARYS 52.3-10.4 MG PO CAPS: 1.0000 | ORAL_CAPSULE | Freq: Every day | ORAL | 0 refills | Status: DC

## 2022-01-03 MED ORDER — TRAZODONE HCL 100 MG PO TABS: 100.0000 mg | ORAL_TABLET | Freq: Every day | ORAL | 1 refills | Status: DC

## 2022-01-03 MED ORDER — DESVENLAFAXINE SUCCINATE ER 100 MG PO TB24: 100.0000 mg | ORAL_TABLET | Freq: Every day | ORAL | 0 refills | Status: DC

## 2022-01-03 NOTE — Progress Notes (Signed)
Heather Davila [id number] 12-20-1960 61 y.o.  Virtual Visit via Video Note  I connected with pt @ on 01/03/22 at  9:30 AM EDT by a video enabled telemedicine application and verified that I am speaking with the correct person using two identifiers.   I discussed the limitations of evaluation and management by telemedicine and the availability of in person appointments. The patient expressed understanding and agreed to proceed.  I discussed the assessment and treatment plan with the patient. The patient was provided an opportunity to ask questions and all were answered. The patient agreed with the plan and demonstrated an understanding of the instructions.   The patient was advised to call back or seek an in-person evaluation if the symptoms worsen or if the condition fails to improve as anticipated.  I provided 30 minutes of non-face-to-face time during this encounter.  The patient was located at home.  The provider was located at [place].   [name], PMHNP   Subjective:   Patient ID:  Heather Davila is a 61 y.o. (DOB July 11, 1960) female.  Chief Complaint:  Chief Complaint  Patient presents with   Insomnia    HPI Heather Davila presents for follow-up of insomnia, anxiety, and depression.   She reports that she has had insomnia for several months since she started taking care of her mother. She reports that she is having to take medication in order to fall asleep. Taking Gabapentin 1000 mg po QHS and Trazodone 100 mg po QHS and Klonopin 1/2 tab and sleeps "pretty good" with this combination. Has tried Dayvigo a couple of times. She has not been able to sleep when she attempts to reduce meds. She reports that sleep has not improved since "things have gotten back to normal." Her mother died 12-31-2021 and reports that she had some healing conversations with her mother at the end and were able to resolve some things. She reports that she returned home about a week after  mother passed away. She reports that depression "is under control for sure." She reports that her anxiety has been "ok." Energy has been low with COVID and otherwise has been ok. Motivation has been ok. Concentration is "good" with Azstarys. Denies affective dulling. Denies SI.   She currently has COVID.   Plans to retire January 1st. Husband retired 2 years ago and he is 67 years older. They have an RV and would like to travel some. One son lives with them and 2 other sons live out of state. Both of their families are out of state.   Gabapentin last filled 8/26 x 2 Azstarys last filled 12/21/21 Dayvigo last filled 11/26/21 Klonopin last filled 11/05/21.   Past Psychiatric Medication Trials: Trintellix - Initially effective and then was not effective when re-started Paxil- Ineffective. Helped initially. Prozac-Insomnia Lexapro- Effective and then no longer as effective Zoloft- Initially effective and then not as effective.  Viibryd Cymbalta- Ineffective. Helped initially. Effexor XR- Effective, well tolerated. Pristiq- Increased anxiety at 150 mg daily Wellbutrin XL-Ineffective.May have increased anxiety. Amitriptyline Nortriptyline-Caused irritability, constipation Auvelity- ineffective Abilify- Helpful but caused severe insomnia. Rexulti-Helpful but caused insomnia. Vraylar Seroquel - Ineffective Risperdal Olanzapine- Ineffective Klonopin- Effective Temazepam- Took during menopause Lunesta- Ineffective Ambien-Ineffective Sonata- Ineffective Trazodone- Ineffective Remeron-Ineffective. Does not recall wt gain.  Adderall- Took once and had adverse effect. Concerta- Effective Metadate-Not as effective as Concerta Dexmethylphenidate- Not as effective as Concerta Azstarys- some side effects. Not as effective.  Lamictal- May have had an adverse effects. "Felt weird." Reports worsening  s/s.  Lithium Gabapentin    Review of Systems:  Review of Systems  Musculoskeletal:  Negative  for gait problem.  Neurological:  Negative for tremors.  Psychiatric/Behavioral:         Please refer to HPI    Medications: I have reviewed the patient's current medications.  Current Outpatient Medications  Medication Sig Dispense Refill   doxycycline (VIBRAMYCIN) 50 MG capsule Take 50 mg by mouth daily.     estradiol (ESTRACE VAGINAL) 0.1 MG/GM vaginal cream 1 gram pv twice weekly at bedtime 42.5 g 3   gabapentin (NEURONTIN) 800 MG tablet Take 1 tablet (800 mg total) by mouth at bedtime. 90 tablet 1   ibuprofen (ADVIL,MOTRIN) 200 MG tablet Take 200 mg by mouth every 6 (six) hours as needed.     MAGNESIUM PO Take by mouth.     Multiple Vitamin (MULTIVITAMIN) tablet Take 1 tablet by mouth daily.     [START ON 02/15/2022] Serdexmethylphen-Dexmethylphen (AZSTARYS) 52.3-10.4 MG CAPS Take 1 capsule by mouth daily. 30 capsule 0   cariprazine (VRAYLAR) 1.5 MG capsule Take 1 capsule (1.5 mg total) by mouth daily. 90 capsule 0   clonazePAM (KLONOPIN) 0.5 MG tablet Take 1 tablet (0.5 mg total) by mouth at bedtime. 30 tablet 2   desvenlafaxine (PRISTIQ) 100 MG 24 hr tablet Take 1 tablet (100 mg total) by mouth daily. 90 tablet 0   gabapentin (NEURONTIN) 100 MG capsule Take 1-3 capsules as needed 270 capsule 1   Lemborexant (DAYVIGO) 10 MG TABS Take 1/2-1 tab po QHS 30 tablet 1   Lemborexant (DAYVIGO) 5 MG TABS Take 5 mg by mouth at bedtime. Can increase to 10 mg po QHS after 3-5 days if 5 mg is ineffective 10 tablet 0   [START ON 01/18/2022] Serdexmethylphen-Dexmethylphen (AZSTARYS) 52.3-10.4 MG CAPS Take 1 capsule by mouth daily. 30 capsule 0   traZODone (DESYREL) 100 MG tablet Take 1 tablet (100 mg total) by mouth at bedtime. 90 tablet 1   valACYclovir (VALTREX) 500 MG tablet Take one twice daily for 3 days with any symptoms 30 tablet 3   No current facility-administered medications for this visit.    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   TRANSAMINASES, SERUM, ELEVATED 08/14/2009    Family History  Problem Relation Age of Onset   Cancer Mother 51       colon cancer   Hypertension Mother    Colon cancer Mother 67       colon rescection   Colon polyps Mother    Hypertension Father    Heart disease Father 86       CABG62   Stroke Father 11   Heart disease Sister        atrial fibrillation   Heart attack Sister 38       no stents   Depression Sister    Heart failure Paternal Aunt    Diabetes Paternal Uncle    Heart disease Paternal Grandfather    Esophageal cancer Neg Hx  Rectal cancer Neg Hx    Stomach cancer Neg Hx     Social History   Socioeconomic History   Marital status: Married    Spouse name: Charles   Number of children: 3   Years of education: Master's   Highest education level: Not on file  Occupational History   Occupation: Optician, dispensing: [employer]  Tobacco Use   Smoking status: Former    Packs/day: 0.50    Years: 5.00    Total pack years: 2.50    Types: Cigarettes    Quit date: 07/10/1987    Years since quitting: 34.5   Smokeless tobacco: Never  Vaping Use   Vaping Use: Never used  Substance and Sexual Activity   Alcohol use: No   Drug use: No   Sexual activity: Yes    Partners: Male    Birth control/protection: Post-menopausal, Other-see comments    Comment: vasectomy  Other Topics Concern   Not on file  Social History Narrative   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   Patient has three children.   Patient works at [employer].   Patient has a Master's degree.   Patient  is left-handed.   Patient drinks 6 cups of coffee daily.   Social Determinants of Health   Financial Resource Strain: Not on file  Food Insecurity: Not on file  Transportation Needs: Not on file  Physical Activity: Not on file  Stress: Not on file  Social Connections: Not on file  Intimate Partner Violence: Not on file    Past Medical History, Surgical history, Social history, and Family history were reviewed and updated as appropriate.   Please see review of systems for further details on the patient's review from today.   Objective:   Physical Exam:  BP 110/60   Pulse 60   LMP 12/27/2007 (Exact Date)   Physical Exam Neurological:     Mental Status: She is alert and oriented to person, place, and time.     Cranial Nerves: No dysarthria.  Psychiatric:        Attention and Perception: Attention and perception normal.        Mood and Affect: Mood normal.        Speech: Speech normal.        Behavior: Behavior is cooperative.        Thought Content: Thought content normal. Thought content is not paranoid or delusional. Thought content does not include homicidal or suicidal ideation. Thought content does not include homicidal or suicidal plan.        Cognition and Memory: Cognition and memory normal.        Judgment: Judgment normal.     Comments: Insight intact     Lab Review:     Component Value Date/Time   NA 137 11/28/2020 0733   K 4.0 11/28/2020 0733   CL 101 11/28/2020 0733   CO2 26 11/28/2020 0733   GLUCOSE 89 11/28/2020 0733   BUN 15 11/28/2020 0733   CREATININE 0.60 11/28/2020 0733   CREATININE 0.68 01/26/2020 0920   CALCIUM 9.8 11/28/2020 0733   PROT 6.9 11/28/2020 0733   ALBUMIN 4.6 11/28/2020 0733   AST 50 (H) 11/28/2020 0733   ALT 81 (H) 11/28/2020 0733   ALKPHOS 64 11/28/2020 0733   BILITOT 0.4 11/28/2020 0733   GFRNONAA >90 05/01/2014 1645   GFRAA >90 05/01/2014 1645  Component Value Date/Time   WBC 3.7 (L) 11/28/2020 0733   RBC 3.95  11/28/2020 0733   HGB 13.2 11/28/2020 0733   HGB 14.1 10/26/2014 1034   HCT 38.1 11/28/2020 0733   PLT 257.0 11/28/2020 0733   MCV 96.6 11/28/2020 0733   MCH 34.4 (H) 01/26/2020 0920   MCHC 34.6 11/28/2020 0733   RDW 12.4 11/28/2020 0733   LYMPHSABS 1.1 11/28/2020 0733   MONOABS 0.3 11/28/2020 0733   EOSABS 0.1 11/28/2020 0733   BASOSABS 0.1 11/28/2020 0733    No results found for: "POCLITH", "LITHIUM"   No results found for: "PHENYTOIN", "PHENOBARB", "VALPROATE", "CBMZ"   .res Assessment: Plan:    Pt seen for 30 minutes and time spent discussing recent insomnia and need to take higher amounts of Gabapentin and re-starting Trazodone in order to be able to sleep. Discussed that Vraylar may be causing insomnia since she has had sleep disturbance with other partial dopamine agonists. Discussed that benefits are outweighing side effects at this time since her depression has been significantly better controlled with Vraylar. Discussed considering gradually reducing meds for sleep after her retirement in January.  Will send in script for Trazodone 100 mg po QHS for insomnia since she reports that this has been helpful for her insomnia.  Will increase Gabapentin to 800 mg po QHS for insomnia. Will continue Gabapentin 100 mg 1-3 capsules as needed.  Continue Dayvigo 10 mg po QHS. Discussed that it may be more effective taken nightly instead of as needed.  Continue Vraylar 1.5 mg po qd for depression.  Continue Pristiq 100 mg po qd for depression and anxiety.  Continue Klonopin 0.5 mg po QHS for insomnia.  Continue azstarys 52.3-10.4 mg po q am for ADHD.  Pt to follow-up in 8 weeks or sooner if clinically indicated.  Patient advised to contact office with any questions, adverse effects, or acute worsening in signs and symptoms.   Dasja was seen today for insomnia.  Diagnoses and all orders for this visit:  Mild episode of recurrent major depressive disorder ([place]) -     cariprazine  (VRAYLAR) 1.5 MG capsule; Take 1 capsule (1.5 mg total) by mouth daily. -     desvenlafaxine (PRISTIQ) 100 MG 24 hr tablet; Take 1 tablet (100 mg total) by mouth daily.  Insomnia, unspecified type -     traZODone (DESYREL) 100 MG tablet; Take 1 tablet (100 mg total) by mouth at bedtime. -     gabapentin (NEURONTIN) 800 MG tablet; Take 1 tablet (800 mg total) by mouth at bedtime. -     gabapentin (NEURONTIN) 100 MG capsule; Take 1-3 capsules as needed  Anxiety -     gabapentin (NEURONTIN) 800 MG tablet; Take 1 tablet (800 mg total) by mouth at bedtime. -     gabapentin (NEURONTIN) 100 MG capsule; Take 1-3 capsules as needed  Attention deficit hyperactivity disorder (ADHD), predominantly inattentive type -     Serdexmethylphen-Dexmethylphen (AZSTARYS) 52.3-10.4 MG CAPS; Take 1 capsule by mouth daily. -     Serdexmethylphen-Dexmethylphen (AZSTARYS) 52.3-10.4 MG CAPS; Take 1 capsule by mouth daily.     Please see After Visit Summary for patient specific instructions.  Future Appointments  Date Time Provider [place]  01/10/2022  8:00 AM Clance Boll, PA-C [facility] None  01/30/2022  8:20 AM Clance Boll, PA-C [facility] None  03/27/2022  1:15 PM Megan Salon, MD [facility]    No orders of the defined types were placed in this  encounter.     -------------------------------

## 2022-01-06 ENCOUNTER — Other Ambulatory Visit: Payer: Self-pay

## 2022-01-06 DIAGNOSIS — F330 Major depressive disorder, recurrent, mild: Secondary | ICD-10-CM

## 2022-01-06 DIAGNOSIS — G4700 Insomnia, unspecified: Secondary | ICD-10-CM

## 2022-01-06 DIAGNOSIS — F419 Anxiety disorder, unspecified: Secondary | ICD-10-CM

## 2022-01-06 MED ORDER — GABAPENTIN 100 MG PO CAPS: ORAL_CAPSULE | ORAL | 1 refills | Status: DC

## 2022-01-07 ENCOUNTER — Telehealth: Payer: Self-pay

## 2022-01-07 NOTE — Telephone Encounter (Signed)
Prior Authorization initiated and submitted for AZSTARYS 52.3-10.4 CAPS with [employer], approval received effective 12/08/2021-01/07/2023, PA# [id number]   Tried to reach pt but left vm to call back about her approval and her co-pay should now be $25.00/month.

## 2022-01-08 ENCOUNTER — Encounter: Payer: Managed Care, Other (non HMO) | Admitting: Student

## 2022-01-08 ENCOUNTER — Ambulatory Visit: Payer: 59 | Admitting: Psychiatry

## 2022-01-10 ENCOUNTER — Encounter: Payer: Self-pay | Admitting: Surgical

## 2022-01-10 ENCOUNTER — Encounter: Payer: Managed Care, Other (non HMO) | Admitting: Student

## 2022-01-10 ENCOUNTER — Ambulatory Visit (INDEPENDENT_AMBULATORY_CARE_PROVIDER_SITE_OTHER): Payer: Managed Care, Other (non HMO) | Admitting: Surgical

## 2022-01-10 VITALS — BP 104/67 | HR 60 | Ht 60.0 in | Wt 93.4 lb

## 2022-01-10 DIAGNOSIS — T8543XA Leakage of breast prosthesis and implant, initial encounter: Secondary | ICD-10-CM

## 2022-01-10 DIAGNOSIS — Z719 Counseling, unspecified: Secondary | ICD-10-CM

## 2022-01-10 MED ORDER — HYDROCODONE-ACETAMINOPHEN 5-325 MG PO TABS: 1.0000 | ORAL_TABLET | Freq: Four times a day (QID) | ORAL | 0 refills | Status: AC | PRN

## 2022-01-10 MED ORDER — CEPHALEXIN 500 MG PO CAPS: 500.0000 mg | ORAL_CAPSULE | Freq: Four times a day (QID) | ORAL | 0 refills | Status: AC

## 2022-01-10 MED ORDER — ONDANSETRON HCL 4 MG PO TABS: 4.0000 mg | ORAL_TABLET | Freq: Three times a day (TID) | ORAL | 0 refills | Status: DC | PRN

## 2022-01-10 NOTE — Progress Notes (Signed)
Patient ID: Heather Davila, female    DOB: [birth year], 61 y.o.   MRN: [id number]  Chief Complaint  Patient presents with   Pre-op Exam     ICD-10-CM   1. Rupture of implant of right breast, initial encounter  T85.43XA     2. Encounter for counseling  Z71.9       History of Present Illness: Heather Davila is a 61 y.o.  female  with a history of bilateral breast implant placement in 2003.  She presents for preoperative evaluation for upcoming procedure, exchange of bilateral breast implants with saline implants, scheduled for 01/20/2022 with Dr. Marla Roe.  The patient has had problems with anesthesia. No history of DVT/PE.  Reports her sister had a DVT in the past.  No family or personal history of bleeding or clotting disorders.  Patient is not currently taking any blood thinners.  No history of CVA/MI.   Summary of Previous Visit: Non-smoker, recent mammogram in September 2022 which was negative, she is not on blood thinners.  She had bilateral saline implants placed in [place] in 2003.  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  She is feeling well, no recent changes in her health.    Past Medical History: Allergies: Allergies  Allergen Reactions   Ciprofloxacin     REACTION: hives   Oxycodone-Acetaminophen     REACTION: hives    Current Medications:  Current Outpatient Medications:    cariprazine (VRAYLAR) 1.5 MG capsule, Take 1 capsule (1.5 mg total) by mouth daily., Disp: 90 capsule, Rfl: 0   cephALEXin (KEFLEX) 500 MG capsule, Take 1 capsule (500 mg total) by mouth 4 (four) times daily for 5 days., Disp: 20 capsule, Rfl: 0   desvenlafaxine (PRISTIQ) 100 MG 24 hr tablet, Take 1 tablet (100 mg total) by mouth daily., Disp: 90 tablet, Rfl: 0   doxycycline (VIBRAMYCIN) 50 MG capsule, Take 50 mg by mouth daily., Disp: , Rfl:     estradiol (ESTRACE VAGINAL) 0.1 MG/GM vaginal cream, 1 gram pv twice weekly at bedtime, Disp: 42.5 g, Rfl: 3   gabapentin (NEURONTIN) 100 MG capsule, Take 1-3 capsules by mouth daily as needed, Disp: 270 capsule, Rfl: 1   gabapentin (NEURONTIN) 800 MG tablet, Take 1 tablet (800 mg total) by mouth at bedtime., Disp: 90 tablet, Rfl: 1   HYDROcodone-acetaminophen (NORCO) 5-325 MG tablet, Take 1 tablet by mouth every 6 (six) hours as needed for up to 3 days for severe pain., Disp: 12 tablet, Rfl: 0   ibuprofen (ADVIL,MOTRIN) 200 MG tablet, Take 200 mg by mouth every 6 (six) hours as needed., Disp: , Rfl:    Lemborexant (DAYVIGO) 10 MG TABS, Take 1/2-1 tab po QHS, Disp: 30 tablet, Rfl: 1   Lemborexant (DAYVIGO) 5 MG TABS, Take 5 mg by mouth at bedtime. Can increase to 10 mg po QHS after 3-5 days if 5 mg is ineffective, Disp: 10 tablet, Rfl: 0   MAGNESIUM PO, Take by mouth., Disp: , Rfl:    Multiple Vitamin (MULTIVITAMIN) tablet, Take 1 tablet by mouth daily., Disp: , Rfl:    ondansetron (ZOFRAN) 4 MG tablet, Take 1 tablet (4 mg total) by mouth every 8 (eight) hours as needed for nausea or vomiting., Disp: 20 tablet, Rfl: 0   [START ON 01/18/2022] Serdexmethylphen-Dexmethylphen (AZSTARYS) 52.3-10.4 MG CAPS, Take 1 capsule by mouth daily., Disp:  30 capsule, Rfl: 0   [START ON 02/15/2022] Serdexmethylphen-Dexmethylphen (AZSTARYS) 52.3-10.4 MG CAPS, Take 1 capsule by mouth daily., Disp: 30 capsule, Rfl: 0   traZODone (DESYREL) 100 MG tablet, Take 1 tablet (100 mg total) by mouth at bedtime., Disp: 90 tablet, Rfl: 1   valACYclovir (VALTREX) 500 MG tablet, Take one twice daily for 3 days with any symptoms, Disp: 30 tablet, Rfl: 3   clonazePAM (KLONOPIN) 0.5 MG tablet, Take 1 tablet (0.5 mg total) by mouth at bedtime., Disp: 30 tablet, Rfl: 2  Past Medical Problems: Past Medical History:  Diagnosis Date   Adult acne    Anemia    Anorexia nervosa teen   DEPRESSION 08/09/2009   Hematoma 09/2020   post op  after face lift   Insomnia    STD (sexually transmitted disease) 08/05/2013   HSV2?, 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   TRANSAMINASES, SERUM, ELEVATED 08/14/2009    Past Surgical History: Past Surgical History:  Procedure Laterality Date   AUGMENTATION MAMMAPLASTY Bilateral    BREAST ENHANCEMENT SURGERY Bilateral    BUNIONECTOMY     CESAREAN SECTION     X 3   FACELIFT  2022   HYSTEROSCOPY  12/27/2007   and HTA   UPPER GASTROINTESTINAL ENDOSCOPY  2020    Social History: Social History   Socioeconomic History   Marital status: Married    Spouse name: Charles   Number of children: 3   Years of education: Master's   Highest education level: Not on file  Occupational History   Occupation: Optician, dispensing: [employer]  Tobacco Use   Smoking status: Former    Packs/day: 0.50    Years: 5.00    Total pack years: 2.50    Types: Cigarettes    Quit date: 07/10/1987    Years since quitting: 34.5   Smokeless tobacco: Never  Vaping Use   Vaping Use: Never used  Substance and Sexual Activity   Alcohol use: No   Drug use: No   Sexual activity: Yes    Partners: Male    Birth control/protection: Post-menopausal, Other-see comments    Comment: vasectomy  Other Topics Concern   Not on file  Social History Narrative   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   Patient has three children.   Patient works at [employer].   Patient has a Master's degree.   Patient is left-handed.   Patient drinks 6 cups of coffee daily.   Social Determinants of Health   Financial Resource Strain: Not on file  Food Insecurity: Not on file  Transportation Needs: Not on file  Physical Activity: Not on file  Stress: Not on file  Social Connections: Not on file  Intimate Partner Violence: Not on file    Family History: Family History  Problem Relation Age of Onset   Cancer Mother 43       colon cancer   Hypertension Mother     Colon cancer Mother 21       colon rescection   Colon polyps Mother    Hypertension Father    Heart disease Father 45       CABG62   Stroke Father 81   Heart disease Sister        atrial fibrillation   Heart attack Sister 36       no stents   Depression Sister    Heart failure Paternal Aunt  Diabetes Paternal Uncle    Heart disease Paternal Grandfather    Esophageal cancer Neg Hx    Rectal cancer Neg Hx    Stomach cancer Neg Hx     Review of Systems: Review of Systems  Constitutional: Negative.   Respiratory: Negative.    Cardiovascular: Negative.   Gastrointestinal: Negative.     Physical Exam: Vital Signs BP 104/67 (BP Location: Left Arm, Patient Position: Sitting, Cuff Size: Small)   Pulse 60   Ht 5' (1.524 m)   Wt 93 lb 6.4 oz (42.4 kg)   LMP 12/27/2007 (Exact Date)   SpO2 99%   BMI 18.24 kg/m   Physical Exam  Constitutional:      General: Not in acute distress.    Appearance: Normal appearance. Not ill-appearing.  HENT:     Head: Normocephalic and atraumatic.  Eyes:     Pupils: Pupils are equal, round Neck:     Musculoskeletal: Normal range of motion.  Cardiovascular:     Rate and Rhythm: Normal rate    Pulses: Normal pulses.  Pulmonary:     Effort: Pulmonary effort is normal. No respiratory distress.  Abdominal:     General: Abdomen is flat. There is no distension.  Musculoskeletal: Normal range of motion.  Skin:    General: Skin is warm and dry.     Findings: No erythema or rash.  Neurological:     General: No focal deficit present.     Mental Status: Alert and oriented to person, place, and time. Mental status is at baseline.     Motor: No weakness.  Psychiatric:        Mood and Affect: Mood normal.        Behavior: Behavior normal.    Assessment/Plan: The patient is scheduled for removal of replacement of bilateral breast implants with Dr. Marla Roe.  Risks, benefits, and alternatives of procedure discussed, questions answered and  consent obtained.    Smoking Status: Non-smoker; Counseling Given?  N/A Last Mammogram: 01/15/2021; Results: Negative, BI-RADS 1 Discussed with patient recommendation for her yearly mammogram prior to surgery, patient reports she had 1 last year around this time and is not due for one till the end of September and does not want to have another one before surgery.  She is aware that she will need to wait approximately 6 months after surgery to have her next mammogram and she is understanding and in agreement with this.    Caprini Score: 7, high; Risk Factors include: Age, family history of DVT, varicose veins, and length of planned surgery. Recommendation for mechanical and possible chemoprophylaxis. Encourage early ambulation.  Will discuss need for postoperative Lovenox with Dr. Marla Roe.  Pictures were previously obtained.  Post-op Rx sent to pharmacy:  Norco, Zofran, Keflex  Patient was provided with the General Surgical Risk consent document and Pain Medication Agreement prior to their appointment.  They had adequate time to read through the risk consent documents and Pain Medication Agreement. We also discussed them in person together during this preop appointment. All of their questions were answered to their satisfaction.  Recommended calling if they have any further questions.  Risk consent form and Pain Medication Agreement to be scanned into patient's chart.  The risks that can be encountered with and after placement of a breast implant were discussed and include the following but not limited to these: bleeding, infection, delayed healing, anesthesia risks, skin sensation changes, injury to structures including nerves, blood vessels, and muscles which may be  temporary or permanent, allergies to tape, suture materials and glues, blood products, topical preparations or injected agents, skin contour irregularities, skin discoloration and swelling, deep vein thrombosis, cardiac and pulmonary  complications, pain, which may persist, fluid accumulation, wrinkling of the skin over the implanmt, changes in nipple or breast sensation, implant leakage or rupture, faulty position of the implant, persistent pain, formation of tight scar tissue around the implant (capsular contracture).  Patient was provided with the [place] implant patient decision checklist and this was completed during today's preoperative evaluation. Patient had time to read through the information and any questions were answered to their content. Form will be scanned into patient's chart.   Electronically signed by: Carola Rhine Gustie Bobb, PA-C 01/10/2022 1:24 PM

## 2022-01-20 DIAGNOSIS — T8543XA Leakage of breast prosthesis and implant, initial encounter: Secondary | ICD-10-CM

## 2022-01-20 DIAGNOSIS — Z411 Encounter for cosmetic surgery: Secondary | ICD-10-CM

## 2022-01-22 ENCOUNTER — Ambulatory Visit: Payer: 59 | Admitting: Psychiatry

## 2022-01-30 ENCOUNTER — Ambulatory Visit (INDEPENDENT_AMBULATORY_CARE_PROVIDER_SITE_OTHER): Payer: Self-pay | Admitting: Student

## 2022-01-30 DIAGNOSIS — T8543XD Leakage of breast prosthesis and implant, subsequent encounter: Secondary | ICD-10-CM

## 2022-01-30 NOTE — Progress Notes (Signed)
Patient is a 61 year old female with history of bilateral breast implant placement in 2003.  Patient underwent exchange of bilateral breast implants with saline implants on 01/20/2022 with Dr. Marla Roe.  Intraoperatively, a Mentor smooth round high-profile saline 290 cc with 310 cc in place was placed on the right side, and a Mentor smooth round high-profile saline 290 cc with 295 cc and placed on the left side.  Patient presents to the clinic today for postoperative follow-up.  Today, patient reports she is doing well.  She states that she is really happy with her results overall.  She denies any issues or concerns.  She denies any fevers or chills.  She denies any nausea or vomiting.  She denies any issues with the surgical sites.  Chaperone present on exam.  On exam, patient is sitting upright in no acute distress.  Implants are in place bilaterally.  There is a little mild overlying ecchymosis to the left medial aspect of the left breast.  There is no overlying erythema.  There are no significant fluid collections palpated on exam.  NAC's appear viable bilaterally.  Incisions are intact with Steri-Strips.  I discussed with the patient that she should continue compression and continue her physical activity restrictions.  Patient expressed understanding.  Patient reported that she is traveling this weekend on a plane to [place].  I discussed with the patient to make sure she gets up and ambulates frequently.  Patient expressed understanding.  I instructed the patient to call if she has any questions or concerns.  Patient to follow-up in 2 weeks.

## 2022-01-31 ENCOUNTER — Encounter: Payer: Self-pay | Admitting: Psychiatry

## 2022-01-31 ENCOUNTER — Telehealth (INDEPENDENT_AMBULATORY_CARE_PROVIDER_SITE_OTHER): Payer: 59 | Admitting: Psychiatry

## 2022-01-31 DIAGNOSIS — F332 Major depressive disorder, recurrent severe without psychotic features: Secondary | ICD-10-CM | POA: Diagnosis not present

## 2022-01-31 DIAGNOSIS — F900 Attention-deficit hyperactivity disorder, predominantly inattentive type: Secondary | ICD-10-CM | POA: Diagnosis not present

## 2022-01-31 DIAGNOSIS — G4700 Insomnia, unspecified: Secondary | ICD-10-CM

## 2022-01-31 MED ORDER — LURASIDONE HCL 40 MG PO TABS: ORAL_TABLET | ORAL | 2 refills | Status: DC

## 2022-01-31 MED ORDER — METHYLPHENIDATE HCL ER (OSM) 36 MG PO TBCR: 36.0000 mg | EXTENDED_RELEASE_TABLET | Freq: Two times a day (BID) | ORAL | 0 refills | Status: DC

## 2022-01-31 MED ORDER — TRAZODONE HCL 100 MG PO TABS: 100.0000 mg | ORAL_TABLET | Freq: Every day | ORAL | 1 refills | Status: DC

## 2022-01-31 NOTE — Progress Notes (Signed)
Heather Davila [id number] July 25, 1960 61 y.o.  Virtual Visit via Video Note  I connected with pt @ on 01/31/22 at  1:15 PM EDT by a video enabled telemedicine application and verified that I am speaking with the correct person using two identifiers.   I discussed the limitations of evaluation and management by telemedicine and the availability of in person appointments. The patient expressed understanding and agreed to proceed.  I discussed the assessment and treatment plan with the patient. The patient was provided an opportunity to ask questions and all were answered. The patient agreed with the plan and demonstrated an understanding of the instructions.   The patient was advised to call back or seek an in-person evaluation if the symptoms worsen or if the condition fails to improve as anticipated.  I provided 30 minutes of non-face-to-face time during this encounter.  The patient was located in her personal vehicle in [place].  The provider was located at [place].   [name], PMHNP   Subjective:   Patient ID:  Heather Davila is a 61 y.o. (DOB 07/12/1960) female.  Chief Complaint:  Chief Complaint  Patient presents with   Depression   Anxiety    HPI Arelys Glassco presents for follow-up of depression, anxiety, insomnia, and ADHD. She reports feeling, "probably the worst I have been... my depression medicine is not working at all." She reports that she has had persistent sad mood and denies any identifiable trigger. She reports that she did not take ADHD meds one day and felt more depressed. She notices that she gets some benefit from Azstarys "but it doesn't last." She stopped Pristiq and Vraylar because they did not seem to be working. She also tried increasing Trazodone and this was not helpful for depression. She reports that anxiety has been increased and is worried about her depression. Has been taking Klonopin 0.5 mg in the afternoon. Has been clinching her teeth.  Sleep has been "ok." Energy and motivation have been low. Concentration improved with ADHD medication and difficulty with concentration when medication wears off after 4 hours. Diminished enjoyment in things. Appetite has been ok. Denies SI- "I would never commit suicide."   Azstarys last filled 01/19/22. Dayvigo last filled 01/15/22. Gabapentin 100 last filled 01/06/22 Gabapentin 800 mg last filled 01/03/22 Klonopin last filled 11/05/21   Past Psychiatric Medication Trials: Trintellix - Initially effective and then was not effective when re-started Paxil- Ineffective. Helped initially. Prozac-Insomnia Lexapro- Effective and then no longer as effective Zoloft- Initially effective and then not as effective.  Viibryd Cymbalta- Ineffective. Helped initially. Effexor XR- Effective, well tolerated. Pristiq- Increased anxiety at 150 mg daily Wellbutrin XL-Ineffective.May have increased anxiety. Amitriptyline Nortriptyline-Caused irritability, constipation Auvelity- ineffective Abilify- Helpful but caused severe insomnia. Rexulti-Helpful but caused insomnia. Vraylar Seroquel - Ineffective Risperdal Olanzapine- Ineffective Klonopin- Effective Temazepam- Took during menopause Lunesta- Ineffective Ambien-Ineffective Sonata- Ineffective Trazodone- Ineffective Remeron-Ineffective. Does not recall wt gain.  Adderall- Took once and had adverse effect. Concerta- Effective Metadate-Not as effective as Concerta Dexmethylphenidate- Not as effective as Concerta Azstarys- some side effects. Not as effective.  Lamictal- May have had an adverse effects. "Felt weird." Reports worsening s/s.  Lithium Gabapentin (Had Comern­o and did not notice improvement).   Review of Systems:  Review of Systems  Gastrointestinal: Negative.   Musculoskeletal:  Negative for gait problem.  Neurological:  Negative for tremors and headaches.  Psychiatric/Behavioral:         Please refer to HPI    Medications: I have  reviewed  the patient's current medications.  Current Outpatient Medications  Medication Sig Dispense Refill   doxycycline (VIBRAMYCIN) 50 MG capsule Take 50 mg by mouth daily.     estradiol (ESTRACE VAGINAL) 0.1 MG/GM vaginal cream 1 gram pv twice weekly at bedtime 42.5 g 3   gabapentin (NEURONTIN) 100 MG capsule Take 1-3 capsules by mouth daily as needed 270 capsule 1   gabapentin (NEURONTIN) 800 MG tablet Take 1 tablet (800 mg total) by mouth at bedtime. 90 tablet 1   ibuprofen (ADVIL,MOTRIN) 200 MG tablet Take 200 mg by mouth every 6 (six) hours as needed.     Lemborexant (DAYVIGO) 10 MG TABS Take 1/2-1 tab po QHS 30 tablet 1   lurasidone (LATUDA) 40 MG TABS tablet Take 1/2 tablet daily with supper for one week, then increase to 1 tablet daily with supper 30 tablet 2   MAGNESIUM PO Take by mouth.     methylphenidate 36 MG PO CR tablet Take 1 tablet (36 mg total) by mouth 2 (two) times daily with breakfast and lunch. 60 tablet 0   Multiple Vitamin (MULTIVITAMIN) tablet Take 1 tablet by mouth daily.     clonazePAM (KLONOPIN) 0.5 MG tablet Take 1 tablet (0.5 mg total) by mouth at bedtime. 30 tablet 2   ondansetron (ZOFRAN) 4 MG tablet Take 1 tablet (4 mg total) by mouth every 8 (eight) hours as needed for nausea or vomiting. (Patient not taking: Reported on 01/31/2022) 20 tablet 0   [START ON 02/15/2022] Serdexmethylphen-Dexmethylphen (AZSTARYS) 52.3-10.4 MG CAPS Take 1 capsule by mouth daily. 30 capsule 0   traZODone (DESYREL) 100 MG tablet Take 1 tablet (100 mg total) by mouth at bedtime. Take 1-2 tabs po QHS prn 180 tablet 1   valACYclovir (VALTREX) 500 MG tablet Take one twice daily for 3 days with any symptoms 30 tablet 3   No current facility-administered medications for this visit.    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   TRANSAMINASES, SERUM, ELEVATED 08/14/2009    Family History  Problem Relation Age of Onset   Cancer Mother 105       colon cancer   Hypertension Mother    Colon cancer Mother 65       colon rescection   Colon polyps Mother    Hypertension Father    Heart disease Father 64       CABG62   Stroke Father 74   Heart disease Sister        atrial fibrillation   Heart attack Sister 76       no stents   Depression Sister    Heart failure Paternal Aunt    Diabetes Paternal Uncle    Heart disease Paternal Grandfather    Esophageal cancer Neg Hx    Rectal cancer Neg Hx    Stomach cancer Neg Hx     Social History   Socioeconomic History   Marital status: Married    Spouse name: [name]   Number of children: 3   Years of  education: Master's   Highest education level: Not on file  Occupational History   Occupation: Nurse    Employer: [employer]  Tobacco Use   Smoking status: Former    Packs/day: 0.50    Years: 5.00    Total pack years: 2.50    Types: Cigarettes    Quit date: 07/10/1987    Years since quitting: 34.5   Smokeless tobacco: Never  Vaping Use   Vaping Use: Never used  Substance and Sexual Activity   Alcohol use: No   Drug use: No   Sexual activity: Yes    Partners: Male    Birth control/protection: Post-menopausal, Other-see comments    Comment: vasectomy  Other Topics Concern   Not on file  Social History Narrative   Patient is married Tax inspector) and lives at home with her husband and two children.   Patient has three children.   Patient works at [employer].   Patient has a Master's degree.   Patient is left-handed.   Patient drinks 6 cups of coffee daily.   Social Determinants of Health   Financial  Resource Strain: Not on file  Food Insecurity: Not on file  Transportation Needs: Not on file  Physical Activity: Not on file  Stress: Not on file  Social Connections: Not on file  Intimate Partner Violence: Not on file    Past Medical History, Surgical history, Social history, and Family history were reviewed and updated as appropriate.   Please see review of systems for further details on the patient's review from today.   Objective:   Physical Exam:  LMP 12/27/2007 (Exact Date)   Physical Exam Neurological:     Mental Status: She is alert and oriented to person, place, and time.     Cranial Nerves: No dysarthria.  Psychiatric:        Attention and Perception: Attention and perception normal.        Mood and Affect: Mood is anxious and depressed.        Speech: Speech normal.        Behavior: Behavior is cooperative.        Thought Content: Thought content normal. Thought content is not paranoid or delusional. Thought content does not include homicidal or suicidal ideation. Thought content does not include homicidal or suicidal plan.        Cognition and Memory: Cognition and memory normal.        Judgment: Judgment normal.     Comments: Insight intact     Lab Review:     Component Value Date/Time   NA 137 11/28/2020 0733   K 4.0 11/28/2020 0733   CL 101 11/28/2020 0733   CO2 26 11/28/2020 0733   GLUCOSE 89 11/28/2020 0733   BUN 15 11/28/2020 0733   CREATININE 0.60 11/28/2020 0733   CREATININE 0.68 01/26/2020 0920   CALCIUM 9.8 11/28/2020 0733   PROT 6.9 11/28/2020 0733   ALBUMIN 4.6 11/28/2020 0733   AST 50 (H) 11/28/2020 0733   ALT 81 (H) 11/28/2020 0733   ALKPHOS 64 11/28/2020 0733   BILITOT 0.4 11/28/2020 0733   GFRNONAA >90 05/01/2014 1645   GFRAA >90 05/01/2014 1645       Component Value Date/Time   WBC 3.7 (L) 11/28/2020 0733   RBC 3.95 11/28/2020 0733   HGB 13.2 11/28/2020 0733   HGB 14.1 10/26/2014 1034   HCT 38.1 11/28/2020 0733   PLT 257.0  11/28/2020 0733   MCV 96.6 11/28/2020 0733  MCH 34.4 (H) 01/26/2020 0920   MCHC 34.6 11/28/2020 0733   RDW 12.4 11/28/2020 0733   LYMPHSABS 1.1 11/28/2020 0733   MONOABS 0.3 11/28/2020 0733   EOSABS 0.1 11/28/2020 0733   BASOSABS 0.1 11/28/2020 0733    No results found for: "POCLITH", "LITHIUM"   No results found for: "PHENYTOIN", "PHENOBARB", "VALPROATE", "CBMZ"   .res Assessment: Plan:    Pt seen for 30 minutes and time spent discussing treatment options for depression to include Fetzima or using Latuda off-label for depression without Bipolar D/O. Discussed potential benefits, risks, and side effects of Latuda. Pt agrees to trial of Latuda. Will start Latuda 20 mg daily with supper for one week, then increase to 40 mg daily with supper.  She reports that Azstarys is helpful, however duration typically lasts no more than 4 hours. She reports that Concerta was most effective although it also had a short duration. Will send in script for Concerta 36 mg twice daily since this will likely require prior authorization.  She has recently been taking Trazodone  200 mg po QHS. Will change script to Trazodone 100 mg 1-2 tabs po QHS for insomnia.  Continue Klonopin 0.5 mg po QHS prn insomnia. Discussed that she should have 2 refills on file.  Continue Dayvigo 10 mg po QHS prn insomnia.  Continue Gabapentin 800 mg po QHS for insomnia and anxiety and 100 mg 1-2 capsules as needed.  Pt to follow-up with this provider in 3 weeks or sooner if clinically indicated.  Patient advised to contact office with any questions, adverse effects, or acute worsening in signs and symptoms.   Aramis was seen today for depression and anxiety.  Diagnoses and all orders for this visit:  Severe episode of recurrent major depressive disorder, without psychotic features ([place]) -     lurasidone (LATUDA) 40 MG TABS tablet; Take 1/2 tablet daily with supper for one week, then increase to 1 tablet daily with  supper  Attention deficit hyperactivity disorder (ADHD), predominantly inattentive type -     methylphenidate 36 MG PO CR tablet; Take 1 tablet (36 mg total) by mouth 2 (two) times daily with breakfast and lunch.  Insomnia, unspecified type -     traZODone (DESYREL) 100 MG tablet; Take 1 tablet (100 mg total) by mouth at bedtime. Take 1-2 tabs po QHS prn     Please see After Visit Summary for patient specific instructions.  Future Appointments  Date Time Provider [place]  02/14/2022  8:30 AM Corena Herter, PA-C [facility] None  03/04/2022  8:30 AM [name], PMHNP [facility] None  03/27/2022  1:15 PM Megan Salon, MD [facility]    No orders of the defined types were placed in this encounter.     -------------------------------

## 2022-02-03 MED ORDER — PRAMIPEXOLE DIHYDROCHLORIDE 0.25 MG PO TABS: ORAL_TABLET | ORAL | 0 refills | Status: DC

## 2022-02-05 NOTE — Telephone Encounter (Signed)
Spoke to pt.She stated this is her second day on mirapex and she feels strange.She can't explain exactly how it makes her feel but said she can't function and wants to try something else.Please advise

## 2022-02-05 NOTE — Telephone Encounter (Signed)
Heather Davila called today at 11:00 to report that the Mirapex is making her sick.  Please call to discuss.

## 2022-02-06 ENCOUNTER — Encounter: Payer: Self-pay | Admitting: Psychiatry

## 2022-02-06 ENCOUNTER — Telehealth (INDEPENDENT_AMBULATORY_CARE_PROVIDER_SITE_OTHER): Payer: 59 | Admitting: Psychiatry

## 2022-02-06 DIAGNOSIS — F332 Major depressive disorder, recurrent severe without psychotic features: Secondary | ICD-10-CM

## 2022-02-06 DIAGNOSIS — G4700 Insomnia, unspecified: Secondary | ICD-10-CM

## 2022-02-06 DIAGNOSIS — F419 Anxiety disorder, unspecified: Secondary | ICD-10-CM

## 2022-02-06 DIAGNOSIS — F900 Attention-deficit hyperactivity disorder, predominantly inattentive type: Secondary | ICD-10-CM

## 2022-02-06 DIAGNOSIS — F339 Major depressive disorder, recurrent, unspecified: Secondary | ICD-10-CM | POA: Diagnosis not present

## 2022-02-06 MED ORDER — LURASIDONE HCL 20 MG PO TABS: ORAL_TABLET | ORAL | 0 refills | Status: DC

## 2022-02-06 MED ORDER — LITHIUM CARBONATE 150 MG PO CAPS: ORAL_CAPSULE | ORAL | 1 refills | Status: DC

## 2022-02-06 NOTE — Progress Notes (Signed)
Heather Davila [id number] 1960-12-18 61 y.o.  Virtual Visit via Video Note  I connected with pt @ on 02/06/22 at 10:00 AM EDT by a video enabled telemedicine application and verified that I am speaking with the correct person using two identifiers.   I discussed the limitations of evaluation and management by telemedicine and the availability of in person appointments. The patient expressed understanding and agreed to proceed.  I discussed the assessment and treatment plan with the patient. The patient was provided an opportunity to ask questions and all were answered. The patient agreed with the plan and demonstrated an understanding of the instructions.   The patient was advised to call back or seek an in-person evaluation if the symptoms worsen or if the condition fails to improve as anticipated.  I provided 25 minutes of non-face-to-face time during this encounter.  The patient was located at home.  The provider was located at [place].   [name], PMHNP   Subjective:   Patient ID:  Heather Davila is a 61 y.o. (DOB 08-Aug-1960) female.  Chief Complaint:  Chief Complaint  Patient presents with  . Depression  . Anxiety  . Sleeping Problem    HPI Heather Davila presents for follow-up of depression and anxiety.  She reports that she traveled over the weekend for a wedding and had a panic attack. She has been continuing to have some increased anxiety. Appetite has been ok  Denies SI.   She was able to obtain Concerta and has been taking it once daily since she did not feel well taking it BID. Prefers Azstarys over [employer] and Focalin.   She reports that she has been sleeping ok and will take Dayvigo prn when unable to sleep.   Plans to retire in January and may be interested in ECT during that time.   Past Psychiatric Medication Trials: Trintellix - Initially effective and then was not effective when re-started Paxil- Ineffective. Helped  initially. Prozac-Insomnia Lexapro- Effective and then no longer as effective Zoloft- Initially effective and then not as effective.  Viibryd Cymbalta- Ineffective. Helped initially. Effexor XR- Effective, well tolerated. Pristiq- Increased anxiety at 150 mg daily Wellbutrin XL-Ineffective.May have increased anxiety. Amitriptyline Nortriptyline-Caused irritability, constipation Auvelity- ineffective Abilify- Helpful but caused severe insomnia. Rexulti-Helpful but caused insomnia. Vraylar Seroquel - Ineffective Risperdal Olanzapine- Ineffective Klonopin- Effective Temazepam- Took during menopause Lunesta- Ineffective Ambien-Ineffective Sonata- Ineffective Trazodone- Ineffective Remeron-Ineffective. Does not recall wt gain.  Adderall- Took once and had adverse effect. Concerta- Effective Metadate-Not as effective as Concerta Dexmethylphenidate- Not as effective as Concerta Azstarys- some side effects. Not as effective.  Lamictal- May have had an adverse effects. "Felt weird." Reports worsening s/s.  Lithium Gabapentin Pramipexole- "felt really weird."   Review of Systems:  Review of Systems  Musculoskeletal:  Negative for gait problem.  Neurological:  Negative for tremors.       No current RLS.   Psychiatric/Behavioral:         Please refer to HPI    Medications: {medication reviewed/display:3041432}  Current Outpatient Medications  Medication Sig Dispense Refill  . doxycycline (VIBRAMYCIN) 50 MG capsule Take 50 mg by mouth daily.    Marland Kitchen gabapentin (NEURONTIN) 100 MG capsule Take 1-3 capsules by mouth daily as needed 270 capsule 1  . gabapentin (NEURONTIN) 800 MG tablet Take 1 tablet (800 mg total) by mouth at bedtime. 90 tablet 1  . lithium carbonate 150 MG capsule Take 1 capsule po QHS x 3-5 days, then increase to 2 capsules po  QHS 60 capsule 1  . lurasidone (LATUDA) 20 MG TABS tablet Take 1/2-1 tablet po daily with supper 30 tablet 0  . methylphenidate 36 MG PO CR  tablet Take 1 tablet (36 mg total) by mouth 2 (two) times daily with breakfast and lunch. 60 tablet 0  . traZODone (DESYREL) 100 MG tablet Take 1 tablet (100 mg total) by mouth at bedtime. Take 1-2 tabs po QHS prn 180 tablet 1  . clonazePAM (KLONOPIN) 0.5 MG tablet Take 1 tablet (0.5 mg total) by mouth at bedtime. 30 tablet 2  . estradiol (ESTRACE VAGINAL) 0.1 MG/GM vaginal cream 1 gram pv twice weekly at bedtime 42.5 g 3  . ibuprofen (ADVIL,MOTRIN) 200 MG tablet Take 200 mg by mouth every 6 (six) hours as needed.    . Lemborexant (DAYVIGO) 10 MG TABS Take 1/2-1 tab po QHS (Patient taking differently: Take 1/2-1 tab po QHS prn) 30 tablet 1  . MAGNESIUM PO Take by mouth.    . Multiple Vitamin (MULTIVITAMIN) tablet Take 1 tablet by mouth daily.    . ondansetron (ZOFRAN) 4 MG tablet Take 1 tablet (4 mg total) by mouth every 8 (eight) hours as needed for nausea or vomiting. (Patient not taking: Reported on 01/31/2022) 20 tablet 0  . valACYclovir (VALTREX) 500 MG tablet Take one twice daily for 3 days with any symptoms 30 tablet 3   No current facility-administered medications for this visit.    Medication Side Effects: {Medication Side Effects (Optional):21014029}  Allergies:  Allergies  Allergen Reactions  . Ciprofloxacin     REACTION: hives  . Oxycodone-Acetaminophen     REACTION: hives    Past Medical History:  Diagnosis Date  . Adult acne   . Anemia   . Anorexia nervosa teen  . DEPRESSION 08/09/2009  . Hematoma 09/2020   post op after face lift  . Insomnia   . 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  . TRANSAMINASES, SERUM, ELEVATED 08/14/2009    Family History  Problem Relation Age of Onset  . Cancer Mother 41       colon cancer  . Hypertension Mother   . Colon cancer Mother 81       colon rescection  . Colon polyps Mother   . Hypertension Father   . Heart disease Father 58       CABG62  . Stroke Father 19  . Heart disease  Sister        atrial fibrillation  . Heart attack Sister 34       no stents  . Depression Sister   . Heart failure Paternal Aunt   . Diabetes Paternal Uncle   . Heart disease Paternal Grandfather   . Esophageal cancer Neg Hx   . Rectal cancer Neg Hx   . Stomach cancer Neg Hx     Social History   Socioeconomic History  . Marital status: Married    Spouse name: [name]  . Number of children: 3  . Years of education: Master's  . Highest education level: Not on file  Occupational History  . Occupation: Optician, dispensing: [employer]  Tobacco Use  . Smoking status: Former    Packs/day: 0.50    Years: 5.00    Total pack years: 2.50    Types: Cigarettes    Quit date: 07/10/1987    Years since quitting: 34.6  . Smokeless tobacco: Never  Vaping Use  . Vaping  Use: Never used  Substance and Sexual Activity  . Alcohol use: No  . Drug use: No  . Sexual activity: Yes    Partners: Male    Birth control/protection: Post-menopausal, Other-see comments    Comment: vasectomy  Other Topics Concern  . Not on file  Social History Narrative   Patient is married [name]) and lives at home with her husband and two children.   Patient has three children.   Patient works at [employer].   Patient has a Master's degree.   Patient is left-handed.   Patient drinks 6 cups of coffee daily.   Social Determinants of Health   Financial Resource Strain: Not on file  Food Insecurity: Not on file  Transportation Needs: Not on file  Physical Activity: Not on file  Stress: Not on file  Social Connections: Not on file  Intimate Partner Violence: Not on file    Past Medical History, Surgical history, Social history, and Family history were reviewed and updated as appropriate.   Please see review of systems for further details on the patient's review from today.   Objective:   Physical Exam:  LMP 12/27/2007 (Exact Date)   Physical Exam  Lab Review:      Component Value Date/Time   NA 137 11/28/2020 0733   K 4.0 11/28/2020 0733   CL 101 11/28/2020 0733   CO2 26 11/28/2020 0733   GLUCOSE 89 11/28/2020 0733   BUN 15 11/28/2020 0733   CREATININE 0.60 11/28/2020 0733   CREATININE 0.68 01/26/2020 0920   CALCIUM 9.8 11/28/2020 0733   PROT 6.9 11/28/2020 0733   ALBUMIN 4.6 11/28/2020 0733   AST 50 (H) 11/28/2020 0733   ALT 81 (H) 11/28/2020 0733   ALKPHOS 64 11/28/2020 0733   BILITOT 0.4 11/28/2020 0733   GFRNONAA >90 05/01/2014 1645   GFRAA >90 05/01/2014 1645       Component Value Date/Time   WBC 3.7 (L) 11/28/2020 0733   RBC 3.95 11/28/2020 0733   HGB 13.2 11/28/2020 0733   HGB 14.1 10/26/2014 1034   HCT 38.1 11/28/2020 0733   PLT 257.0 11/28/2020 0733   MCV 96.6 11/28/2020 0733   MCH 34.4 (H) 01/26/2020 0920   MCHC 34.6 11/28/2020 0733   RDW 12.4 11/28/2020 0733   LYMPHSABS 1.1 11/28/2020 0733   MONOABS 0.3 11/28/2020 0733   EOSABS 0.1 11/28/2020 0733   BASOSABS 0.1 11/28/2020 0733    No results found for: "POCLITH", "LITHIUM"   No results found for: "PHENYTOIN", "PHENOBARB", "VALPROATE", "CBMZ"   .res Assessment: Plan:    Waniya was seen today for depression, anxiety and sleeping problem.  Diagnoses and all orders for this visit:  Severe episode of recurrent major depressive disorder, without psychotic features ([place]) -     lurasidone (LATUDA) 20 MG TABS tablet; Take 1/2-1 tablet po daily with supper -     lithium carbonate 150 MG capsule; Take 1 capsule po QHS x 3-5 days, then increase to 2 capsules po QHS     Please see After Visit Summary for patient specific instructions.  Future Appointments  Date Time Provider [place]  02/14/2022  8:30 AM Corena Herter, PA-C [facility] None  02/21/2022 12:45 PM [name], PMHNP [facility] None  03/27/2022  1:15 PM Megan Salon, MD [facility]    No orders of the defined types were placed in this encounter.     -------------------------------

## 2022-02-10 ENCOUNTER — Encounter: Payer: Managed Care, Other (non HMO) | Admitting: Surgical

## 2022-02-10 ENCOUNTER — Telehealth: Payer: Self-pay | Admitting: Psychiatry

## 2022-02-10 NOTE — Telephone Encounter (Signed)
Is it possible to scan the letter into My Chart where she can access it?

## 2022-02-10 NOTE — Telephone Encounter (Signed)
Letter signed and placed in office in box. Please let pt know letter is ready for pick up.

## 2022-02-10 NOTE — Telephone Encounter (Signed)
Pt requesting leave of Absence. Please print the following on Letterhead:    RE: Octivia Canion (DOB: 10-31-2060)    February 10, 2022  To Whom It May Concern:  I am writing to recommend a leave of absence for Princesa Willig (DOB: 10-31-2060) effective today through 03/13/22, with tentative return to work date of 03/14/22. Ms. Matsen is experiencing an exacerbation of depressive symptoms that are severely interfering with her ability to function at work. She is being referred for additional treatments and medications are being adjusted to target these symptoms.   Please contact our office with any questions or if additional information is needed.    Sincerely,    [name], PMH-NP

## 2022-02-11 ENCOUNTER — Telehealth: Payer: Self-pay | Admitting: Psychiatry

## 2022-02-11 NOTE — Progress Notes (Signed)
Patient is a 61 year old female with PMH of bilateral saline implant placement 2003 with subsequent rupture s/p replacement with saline implants performed 01/20/2022 Dr. Marla Roe presents to clinic for postoperative follow-up.    She was last seen here in clinic on 01/30/2022.  At that time, she was pleased with the outcome.  Exam was largely reassuring.  Continued compressive garments and activity modifications.  Today, patient is doing well.  She is pleased with the outcome from her surgery.  No complaints at this time.  She reports that her incisions have healed nicely.  She inquires about ongoing restrictions.  Steri-Strips removed.  Implants well-positioned.  No seromas or hematomas.  Incisions well-healed, small less than 0.5 cm superficial wound where sutures removed from left breast infra-areolar incision.  No overlying skin changes.  Discussed ongoing restrictions.  Vaseline to the small wound, suspect that it will heal within the next few days.  After that, she can begin scar mitigation gels.  Continue with activity modifications and sleeping on her back for another couple of weeks.  She will also obtain from submerging breasts in water until her wound has completely healed.  No specific follow-up needed.  Picture(s) obtained of the patient and placed in the chart were with the patient's or guardian's permission.

## 2022-02-11 NOTE — Telephone Encounter (Addendum)
Pt faxed paperwork to complete for LOA. Advised letter will not work. Call Pt when completed. Put in Magnet box to review.

## 2022-02-12 ENCOUNTER — Telehealth: Payer: Self-pay

## 2022-02-12 NOTE — Telephone Encounter (Signed)
Prior Authorization initiated for METHYLPHENIDATE CR 36 MG BID with [employer], pending response

## 2022-02-13 ENCOUNTER — Telehealth: Payer: Self-pay | Admitting: Psychiatry

## 2022-02-13 NOTE — Telephone Encounter (Signed)
Referral faxed the Mia at [facility] for ECT

## 2022-02-13 NOTE — Telephone Encounter (Signed)
Prior Authorization not needed pt already picked up medication.

## 2022-02-13 NOTE — Telephone Encounter (Signed)
Contacted pt to provide update regarding status of ECT referral. Also discussed intermittent FMLA and needed accommodations. Discussed requesting FMLA intermittent leave with duration of 2 hours- 3 days, with frequency of 1-2 times a week.   She reports that she has been tolerating Latuda and not experiencing RLS. She reports that she recently increased her dose to 20 mg in the evening. She reports that she has also increased Lithium to 300 mg at bedtime and is tolerating this as well. She is unsure about benefit at this time.   Patient advised to contact office with any questions, adverse effects, or acute worsening in signs and symptoms.

## 2022-02-14 ENCOUNTER — Ambulatory Visit (INDEPENDENT_AMBULATORY_CARE_PROVIDER_SITE_OTHER): Payer: Self-pay | Admitting: Physician Assistant

## 2022-02-14 DIAGNOSIS — T8543XD Leakage of breast prosthesis and implant, subsequent encounter: Secondary | ICD-10-CM

## 2022-02-14 DIAGNOSIS — Z0289 Encounter for other administrative examinations: Secondary | ICD-10-CM

## 2022-02-14 NOTE — Telephone Encounter (Signed)
Pt was notified forms complete.Pt will pick up.

## 2022-02-14 NOTE — Telephone Encounter (Signed)
Paper work completed and given to [place] to review and sign

## 2022-02-14 NOTE — Telephone Encounter (Signed)
Pt reported letter can not be used. Pt faxed [place] paperwork to complete and call pt to pick up when completed.

## 2022-02-21 ENCOUNTER — Encounter: Payer: Self-pay | Admitting: Psychiatry

## 2022-02-21 ENCOUNTER — Telehealth (INDEPENDENT_AMBULATORY_CARE_PROVIDER_SITE_OTHER): Payer: 59 | Admitting: Psychiatry

## 2022-02-21 DIAGNOSIS — F339 Major depressive disorder, recurrent, unspecified: Secondary | ICD-10-CM | POA: Diagnosis not present

## 2022-02-21 DIAGNOSIS — F900 Attention-deficit hyperactivity disorder, predominantly inattentive type: Secondary | ICD-10-CM

## 2022-02-21 DIAGNOSIS — G4700 Insomnia, unspecified: Secondary | ICD-10-CM | POA: Diagnosis not present

## 2022-02-21 DIAGNOSIS — F419 Anxiety disorder, unspecified: Secondary | ICD-10-CM

## 2022-02-21 MED ORDER — LITHIUM CARBONATE 600 MG PO CAPS: 600.0000 mg | ORAL_CAPSULE | Freq: Every day | ORAL | 1 refills | Status: DC

## 2022-02-21 MED ORDER — TRAZODONE HCL 100 MG PO TABS: 100.0000 mg | ORAL_TABLET | Freq: Every day | ORAL | 1 refills | Status: DC

## 2022-02-21 MED ORDER — LURASIDONE HCL 20 MG PO TABS: 20.0000 mg | ORAL_TABLET | Freq: Every day | ORAL | 1 refills | Status: DC

## 2022-02-21 MED ORDER — DAYVIGO 10 MG PO TABS: ORAL_TABLET | ORAL | 1 refills | Status: DC

## 2022-02-21 MED ORDER — GABAPENTIN 800 MG PO TABS: 800.0000 mg | ORAL_TABLET | Freq: Every day | ORAL | 1 refills | Status: DC

## 2022-02-21 MED ORDER — CLONAZEPAM 0.5 MG PO TABS: 0.5000 mg | ORAL_TABLET | Freq: Every day | ORAL | 2 refills | Status: DC

## 2022-02-21 MED ORDER — GABAPENTIN 100 MG PO CAPS: ORAL_CAPSULE | ORAL | 1 refills | Status: DC

## 2022-02-21 MED ORDER — METHYLPHENIDATE HCL ER (OSM) 36 MG PO TBCR: 36.0000 mg | EXTENDED_RELEASE_TABLET | Freq: Two times a day (BID) | ORAL | 0 refills | Status: DC

## 2022-02-21 NOTE — Progress Notes (Unsigned)
Heather Davila [id number] [birth year] 61 y.o.  Virtual Visit via Video Note  I connected with pt @ on 02/21/22 at 12:45 PM EDT by a video enabled telemedicine application and verified that I am speaking with the correct person using two identifiers.   I discussed the limitations of evaluation and management by telemedicine and the availability of in person appointments. The patient expressed understanding and agreed to proceed.  I discussed the assessment and treatment plan with the patient. The patient was provided an opportunity to ask questions and all were answered. The patient agreed with the plan and demonstrated an understanding of the instructions.   The patient was advised to call back or seek an in-person evaluation if the symptoms worsen or if the condition fails to improve as anticipated.  I provided 30 minutes of non-face-to-face time during this encounter.  The patient was located at home.  The provider was located at [place].   [name], PMHNP   Subjective:   Patient ID:  Heather Davila is a 61 y.o. (DOB [birth year]) female.  Chief Complaint:  Chief Complaint  Patient presents with   Depression   Anxiety    HPI Heather Davila presents for follow-up of depression, anxiety, and insomnia. She reports, "I'm not doing that great... I improved a little bit, but not that much. " Continued persistent sad mood. She has been having low energy and motivation- "can't even walk my dog." She reports that she has been able to work some with [employer] accommodations. She reports that her anxiety has been "a little bit higher." Denies any panic attacks since last visit. She reports that her sleep is ok with medication. She reports that concentration is improved with Concerta. No change in appetite. Denies SI.   She reports that she just learned that she will have an ECT consultation at [facility] on 02/25/22.  Not having restlessness currently on  Latuda 20 mg.   Concerta last filled 40/9/81 Dayvigo last filled 01/15/22 Klonopin last filled 11/05/21  Past Psychiatric Medication Trials: Trintellix - Initially effective and then was not effective when re-started Paxil- Ineffective. Helped initially. Prozac-Insomnia Lexapro- Effective and then no longer as effective Zoloft- Initially effective and then not as effective.  Viibryd Cymbalta- Ineffective. Helped initially. Effexor XR- Effective, well tolerated. Pristiq- Increased anxiety at 150 mg daily Wellbutrin XL-Ineffective.May have increased anxiety. Amitriptyline Nortriptyline-Caused irritability, constipation Auvelity- ineffective Abilify- Helpful but caused severe insomnia. Rexulti-Helpful but caused insomnia. Vraylar Seroquel - Ineffective Risperdal Olanzapine- Ineffective Klonopin- Effective Temazepam- Took during menopause Lunesta- Ineffective Ambien-Ineffective Sonata- Ineffective Trazodone- Ineffective Remeron-Ineffective. Does not recall wt gain.  Adderall- Took once and had adverse effect. Concerta- Effective Metadate-Not as effective as Concerta Dexmethylphenidate- Not as effective as Concerta Azstarys- some side effects. Not as effective.  Lamictal- May have had an adverse effects. "Felt weird." Reports worsening s/s.  Lithium Gabapentin Pramipexole- "felt really weird."     Review of Systems:  Review of Systems  Musculoskeletal:  Negative for gait problem.  Neurological:  Negative for tremors.       Denies RLS  Psychiatric/Behavioral:         Please refer to HPI    Medications: I have reviewed the patient's current medications.  Current Outpatient Medications  Medication Sig Dispense Refill   doxycycline (VIBRAMYCIN) 50 MG capsule Take 50 mg by mouth daily.     estradiol (ESTRACE VAGINAL) 0.1 MG/GM vaginal cream 1 gram pv twice weekly at bedtime 42.5 g 3   ibuprofen (  ADVIL,MOTRIN) 200 MG tablet Take 200 mg by mouth every 6 (six) hours as  needed.     MAGNESIUM PO Take by mouth.     Multiple Vitamin (MULTIVITAMIN) tablet Take 1 tablet by mouth daily.     valACYclovir (VALTREX) 500 MG tablet Take one twice daily for 3 days with any symptoms 30 tablet 3   clonazePAM (KLONOPIN) 0.5 MG tablet Take 1 tablet (0.5 mg total) by mouth at bedtime. 30 tablet 2   gabapentin (NEURONTIN) 100 MG capsule Take 1-3 capsules by mouth daily as needed 270 capsule 1   gabapentin (NEURONTIN) 800 MG tablet Take 1 tablet (800 mg total) by mouth at bedtime. 90 tablet 1   Lemborexant (DAYVIGO) 10 MG TABS Take 1/2-1 tab po QHS 30 tablet 1   lithium carbonate 600 MG capsule Take 1 capsule (600 mg total) by mouth at bedtime. 30 capsule 1   lurasidone (LATUDA) 20 MG TABS tablet Take 1 tablet (20 mg total) by mouth daily with supper. 30 tablet 1   [START ON 02/28/2022] methylphenidate 36 MG PO CR tablet Take 1 tablet (36 mg total) by mouth 2 (two) times daily with breakfast and lunch. 60 tablet 0   ondansetron (ZOFRAN) 4 MG tablet Take 1 tablet (4 mg total) by mouth every 8 (eight) hours as needed for nausea or vomiting. (Patient not taking: Reported on 01/31/2022) 20 tablet 0   traZODone (DESYREL) 100 MG tablet Take 1 tablet (100 mg total) by mouth at bedtime. Take 1-2 tabs po QHS prn 180 tablet 1   No current facility-administered medications for this visit.    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   TRANSAMINASES, SERUM, ELEVATED 08/14/2009    Family History  Problem Relation Age of Onset   Cancer Mother 64       colon cancer   Hypertension Mother    Colon cancer Mother  90       colon rescection   Colon polyps Mother    Hypertension Father    Heart disease Father 65       CABG62   Stroke Father 45   Heart disease Sister        atrial fibrillation   Heart attack Sister 48       no stents   Depression Sister    Heart failure Paternal Aunt    Diabetes Paternal Uncle    Heart disease Paternal Grandfather    Esophageal cancer Neg Hx    Rectal cancer Neg Hx    Stomach cancer Neg Hx     Social History   Socioeconomic History   Marital status: Married    Spouse name: [name]   Number of children: 3   Years of education: Master's   Highest education level: Not on file  Occupational History   Occupation: Optician, dispensing: [employer]  Tobacco Use   Smoking status: Former    Packs/day: 0.50    Years: 5.00    Total pack years: 2.50  Types: Cigarettes    Quit date: 07/10/1987    Years since quitting: 34.6   Smokeless tobacco: Never  Vaping Use   Vaping Use: Never used  Substance and Sexual Activity   Alcohol use: No   Drug use: No   Sexual activity: Yes    Partners: Male    Birth control/protection: Post-menopausal, Other-see comments    Comment: vasectomy  Other Topics Concern   Not on file  Social History Narrative   Patient is married Tax inspector) and lives at home with her husband and two children.   Patient has three children.   Patient works at [employer].   Patient has a Master's degree.   Patient is left-handed.   Patient drinks 6 cups of coffee daily.   Social Determinants of Health   Financial Resource Strain: Not on file  Food Insecurity: Not on file  Transportation Needs: Not on file  Physical Activity: Not on file  Stress: Not on file  Social Connections: Not on file  Intimate Partner Violence: Not on file    Past Medical History, Surgical history, Social history, and Family history were reviewed and updated as appropriate.   Please see review of systems for further details on the  patient's review from today.   Objective:   Physical Exam:  LMP 12/27/2007 (Exact Date)   Physical Exam Neurological:     Mental Status: She is alert and oriented to person, place, and time.     Cranial Nerves: No dysarthria.  Psychiatric:        Attention and Perception: Attention and perception normal.        Mood and Affect: Mood is anxious and depressed.        Speech: Speech normal.        Behavior: Behavior is cooperative.        Thought Content: Thought content normal. Thought content is not paranoid or delusional. Thought content does not include homicidal or suicidal ideation. Thought content does not include homicidal or suicidal plan.        Cognition and Memory: Cognition and memory normal.        Judgment: Judgment normal.     Comments: Insight intact     Lab Review:     Component Value Date/Time   NA 137 11/28/2020 0733   K 4.0 11/28/2020 0733   CL 101 11/28/2020 0733   CO2 26 11/28/2020 0733   GLUCOSE 89 11/28/2020 0733   BUN 15 11/28/2020 0733   CREATININE 0.60 11/28/2020 0733   CREATININE 0.68 01/26/2020 0920   CALCIUM 9.8 11/28/2020 0733   PROT 6.9 11/28/2020 0733   ALBUMIN 4.6 11/28/2020 0733   AST 50 (H) 11/28/2020 0733   ALT 81 (H) 11/28/2020 0733   ALKPHOS 64 11/28/2020 0733   BILITOT 0.4 11/28/2020 0733   GFRNONAA >90 05/01/2014 1645   GFRAA >90 05/01/2014 1645       Component Value Date/Time   WBC 3.7 (L) 11/28/2020 0733   RBC 3.95 11/28/2020 0733   HGB 13.2 11/28/2020 0733   HGB 14.1 10/26/2014 1034   HCT 38.1 11/28/2020 0733   PLT 257.0 11/28/2020 0733   MCV 96.6 11/28/2020 0733   MCH 34.4 (H) 01/26/2020 0920   MCHC 34.6 11/28/2020 0733   RDW 12.4 11/28/2020 0733   LYMPHSABS 1.1 11/28/2020 0733   MONOABS 0.3 11/28/2020 0733   EOSABS 0.1 11/28/2020 0733   BASOSABS 0.1 11/28/2020 0733    No results found for: "POCLITH", "LITHIUM"  No results found for: "PHENYTOIN", "PHENOBARB", "VALPROATE", "CBMZ"   .res Assessment: Plan:    Pt seen for 30 minutes and time spent discussin referral to ECT, FMLA accommodations, and  Recommend taking Concerta twice daily.   Blandina was seen today for depression and anxiety.  Diagnoses and all orders for this visit:  Recurrent major depression resistant to treatment ([place]) -     lithium carbonate 600 MG capsule; Take 1 capsule (600 mg total) by mouth at bedtime. -     lurasidone (LATUDA) 20 MG TABS tablet; Take 1 tablet (20 mg total) by mouth daily with supper.  Insomnia, unspecified type -     traZODone (DESYREL) 100 MG tablet; Take 1 tablet (100 mg total) by mouth at bedtime. Take 1-2 tabs po QHS prn -     gabapentin (NEURONTIN) 800 MG tablet; Take 1 tablet (800 mg total) by mouth at bedtime. -     gabapentin (NEURONTIN) 100 MG capsule; Take 1-3 capsules by mouth daily as needed -     clonazePAM (KLONOPIN) 0.5 MG tablet; Take 1 tablet (0.5 mg total) by mouth at bedtime. -     Lemborexant (DAYVIGO) 10 MG TABS; Take 1/2-1 tab po QHS  Anxiety -     gabapentin (NEURONTIN) 800 MG tablet; Take 1 tablet (800 mg total) by mouth at bedtime. -     gabapentin (NEURONTIN) 100 MG capsule; Take 1-3 capsules by mouth daily as needed -     clonazePAM (KLONOPIN) 0.5 MG tablet; Take 1 tablet (0.5 mg total) by mouth at bedtime.  Attention deficit hyperactivity disorder (ADHD), predominantly inattentive type -     methylphenidate 36 MG PO CR tablet; Take 1 tablet (36 mg total) by mouth 2 (two) times daily with breakfast and lunch.     Please see After Visit Summary for patient specific instructions.  Future Appointments  Date Time Provider [place]  03/14/2022  8:30 AM [name], PMHNP [facility] None  03/27/2022  1:15 PM Megan Salon, MD [facility]    No orders of the defined types were placed in this encounter.     -------------------------------

## 2022-02-25 ENCOUNTER — Telehealth: Payer: Self-pay | Admitting: Psychiatry

## 2022-02-25 NOTE — Telephone Encounter (Signed)
Pt called to report she starts ECT treatment Nov 8 will need new paperwork starting Nov 8 for 1 month out of work. Contact pt [phone]

## 2022-02-25 NOTE — Telephone Encounter (Signed)
Agree with plan for continuous leave of absence. Please complete FMLA forms and contact pt to determine if she needs leave to start immediately or next week. Thanks.

## 2022-02-26 ENCOUNTER — Other Ambulatory Visit (HOSPITAL_BASED_OUTPATIENT_CLINIC_OR_DEPARTMENT_OTHER): Payer: Self-pay | Admitting: Obstetrics & Gynecology

## 2022-02-27 ENCOUNTER — Telehealth: Payer: Self-pay | Admitting: Psychiatry

## 2022-02-27 ENCOUNTER — Encounter: Payer: Managed Care, Other (non HMO) | Admitting: Surgical

## 2022-02-27 NOTE — Telephone Encounter (Signed)
Received paperwork via fax to update for ECT treatments starting 03/07/22.Placed in Traci's box

## 2022-02-27 NOTE — Telephone Encounter (Signed)
Noted will update paper work received today

## 2022-02-28 NOTE — Telephone Encounter (Signed)
Have forms and working on, needs completed prior to her first ECT on 03/07/2022

## 2022-02-28 NOTE — Telephone Encounter (Signed)
On [place] CURRENT FMLA she is out intermittent basis ONLY. You are wanting to put her continuous for her 12 treatments? She begins on 03/07/2022 with 2-3 per week for 12 treatments then will go down to maintenance treatments.   Just let me know and I will update and let her know, she just needs them prior to beginning treatments next Friday.

## 2022-02-28 NOTE — Telephone Encounter (Signed)
Papers reviewed and signed. Papers in green folder in my box.

## 2022-02-28 NOTE — Telephone Encounter (Signed)
Spoke with pt and we will start continuous on 03/07/2022 for 5 weeks. Then reassess how her treatments are going since the [place] are coming up. Informed her I would get that update, keep her intermittent fmla as well. Will contact her once Jessica signs it. Pt will pick up.

## 2022-02-28 NOTE — Telephone Encounter (Signed)
Pt notified paper work ready for pick up.

## 2022-02-28 NOTE — Telephone Encounter (Signed)
Yes, she has been approved for intermittent FMLA and is requesting continuous FMLA while starting ECT. Please contact patient to find out how long she is needing continuous leave/FMLA.

## 2022-03-03 NOTE — Telephone Encounter (Signed)
Forms already completed, see other phone message

## 2022-03-04 ENCOUNTER — Telehealth: Payer: 59 | Admitting: Psychiatry

## 2022-03-13 ENCOUNTER — Encounter: Payer: Self-pay | Admitting: Psychiatry

## 2022-03-13 ENCOUNTER — Telehealth (INDEPENDENT_AMBULATORY_CARE_PROVIDER_SITE_OTHER): Payer: 59 | Admitting: Psychiatry

## 2022-03-13 DIAGNOSIS — G4700 Insomnia, unspecified: Secondary | ICD-10-CM

## 2022-03-13 DIAGNOSIS — F419 Anxiety disorder, unspecified: Secondary | ICD-10-CM | POA: Diagnosis not present

## 2022-03-13 DIAGNOSIS — F339 Major depressive disorder, recurrent, unspecified: Secondary | ICD-10-CM

## 2022-03-13 MED ORDER — LURASIDONE HCL 20 MG PO TABS: ORAL_TABLET | ORAL | 1 refills | Status: DC

## 2022-03-13 MED ORDER — VENLAFAXINE HCL ER 150 MG PO CP24: 150.0000 mg | ORAL_CAPSULE | Freq: Every day | ORAL | 0 refills | Status: DC

## 2022-03-13 MED ORDER — VENLAFAXINE HCL ER 75 MG PO CP24: ORAL_CAPSULE | ORAL | 0 refills | Status: DC

## 2022-03-13 NOTE — Progress Notes (Signed)
Heather Davila [id number] 1961/02/23 61 y.o.  Virtual Visit via Video Note  I connected with pt @ on 03/13/22 at  1:00 PM EST by a video enabled telemedicine application and verified that I am speaking with the correct person using two identifiers.   I discussed the limitations of evaluation and management by telemedicine and the availability of in person appointments. The patient expressed understanding and agreed to proceed.  I discussed the assessment and treatment plan with the patient. The patient was provided an opportunity to ask questions and all were answered. The patient agreed with the plan and demonstrated an understanding of the instructions.   The patient was advised to call back or seek an in-person evaluation if the symptoms worsen or if the condition fails to improve as anticipated.  I provided 25 minutes of non-face-to-face time during this encounter.  The patient was located at home.  The provider was located at [place].   [name], PMHNP   Subjective:   Patient ID:  Heather Davila is a 61 y.o. (DOB 07/30/1960) female.  Chief Complaint:  Chief Complaint  Patient presents with   Depression    HPI Heather Davila presents for follow-up of depression She has had 3 ECT treatments so far. "The medicine I am on is not helping at all."   She reports that her depression remains severe- "I've never been so depressed in my life." She reports that she has no interest in things. Anhedonia. Low energy and motivation. Denies irritability. She reports that her anxiety is "not too bad." She reports that she is staying in bed about 12 hours. She reports difficulty falling asleep. She has some intermittent awakenings. Appetite has been ok and possibly slightly decreased. Concentration has been poor. "The only thing that gives me a little boost is the ADHD medicine (Concerta)." Denies SI.   She does not think Lithium or Latuda have been helpful.   Currently on  continuous leave of absence from work.    Past Psychiatric Medication Trials: Trintellix - Initially effective and then was not effective when re-started Paxil- Ineffective. Helped initially. Prozac-Insomnia Lexapro- Effective and then no longer as effective Zoloft- Initially effective and then not as effective.  Viibryd Cymbalta- Ineffective. Helped initially. Effexor XR- Effective, well tolerated. Pristiq- Increased anxiety at 150 mg daily Wellbutrin XL-Ineffective.May have increased anxiety. Amitriptyline Nortriptyline-Caused irritability, constipation Auvelity- ineffective Abilify- Helpful but caused severe insomnia. Rexulti-Helpful but caused insomnia. Vraylar Seroquel - Ineffective Risperdal Olanzapine- Ineffective Latuda Klonopin- Effective Temazepam- Took during menopause Lunesta- Ineffective Ambien-Ineffective Sonata- Ineffective Trazodone- Ineffective Remeron-Ineffective. Does not recall wt gain.  Adderall- Took once and had adverse effect. Concerta- Effective Metadate-Not as effective as Concerta Dexmethylphenidate- Not as effective as Concerta Azstarys- some side effects. Not as effective.  Lamictal- May have had an adverse effects. "Felt weird." Reports worsening s/s.  Lithium Gabapentin Pramipexole- "felt really weird."     Review of Systems:  Review of Systems  Musculoskeletal:  Negative for gait problem.  Neurological:  Negative for tremors.  Psychiatric/Behavioral:         Please refer to HPI    Medications: I have reviewed the patient's current medications.  Current Outpatient Medications  Medication Sig Dispense Refill   clonazePAM (KLONOPIN) 0.5 MG tablet Take 1 tablet (0.5 mg total) by mouth at bedtime. 30 tablet 2   doxycycline (VIBRAMYCIN) 50 MG capsule Take 50 mg by mouth daily.     estradiol (ESTRACE) 0.1 MG/GM vaginal cream USE 1 GM VAGINALLY TWICE A WEEK  AT BEDTIME 42.5 g 0   Lemborexant (DAYVIGO) 10 MG TABS Take 1/2-1 tab po QHS 30  tablet 1   MAGNESIUM PO Take by mouth.     methylphenidate 36 MG PO CR tablet Take 1 tablet (36 mg total) by mouth 2 (two) times daily with breakfast and lunch. 60 tablet 0   Multiple Vitamin (MULTIVITAMIN) tablet Take 1 tablet by mouth daily.     traZODone (DESYREL) 100 MG tablet Take 1 tablet (100 mg total) by mouth at bedtime. Take 1-2 tabs po QHS prn 180 tablet 1   venlafaxine XR (EFFEXOR XR) 150 MG 24 hr capsule Take 1 capsule (150 mg total) by mouth daily with breakfast. 30 capsule 0   venlafaxine XR (EFFEXOR XR) 75 MG 24 hr capsule Take 1 capsule daily in the morning for one week, then increase to 2 capsules daily as tolerated 30 capsule 0   gabapentin (NEURONTIN) 100 MG capsule Take 1-3 capsules by mouth daily as needed (Patient not taking: Reported on 03/13/2022) 270 capsule 1   gabapentin (NEURONTIN) 800 MG tablet Take 1 tablet (800 mg total) by mouth at bedtime. (Patient not taking: Reported on 03/13/2022) 90 tablet 1   ibuprofen (ADVIL,MOTRIN) 200 MG tablet Take 200 mg by mouth every 6 (six) hours as needed.     lurasidone (LATUDA) 20 MG TABS tablet Decrease to 20 mg at bedtime for 3 nights, then stop 30 tablet 1   ondansetron (ZOFRAN) 4 MG tablet Take 1 tablet (4 mg total) by mouth every 8 (eight) hours as needed for nausea or vomiting. (Patient not taking: Reported on 01/31/2022) 20 tablet 0   valACYclovir (VALTREX) 500 MG tablet Take one twice daily for 3 days with any symptoms 30 tablet 3   No current facility-administered medications for this visit.    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    TRANSAMINASES, SERUM, ELEVATED 08/14/2009    Family History  Problem Relation Age of Onset   Cancer Mother 51       colon cancer   Hypertension Mother    Colon cancer Mother 68       colon rescection   Colon polyps Mother    Hypertension Father    Heart disease Father 21       CABG62   Stroke Father 19   Heart disease Sister        atrial fibrillation   Heart attack Sister 32       no stents   Depression Sister    Heart failure Paternal Aunt    Diabetes Paternal Uncle    Heart disease Paternal Grandfather    Esophageal cancer Neg Hx    Rectal cancer Neg Hx    Stomach cancer Neg Hx     Social History   Socioeconomic History   Marital status: Married    Spouse name: [name]   Number of children: 3   Years of education: Master's   Highest education level: Not on file  Occupational History   Occupation: Marine scientist  Employer: [employer]  Tobacco Use   Smoking status: Former    Packs/day: 0.50    Years: 5.00    Total pack years: 2.50    Types: Cigarettes    Quit date: 07/10/1987    Years since quitting: 34.6   Smokeless tobacco: Never  Vaping Use   Vaping Use: Never used  Substance and Sexual Activity   Alcohol use: No   Drug use: No   Sexual activity: Yes    Partners: Male    Birth control/protection: Post-menopausal, Other-see comments    Comment: vasectomy  Other Topics Concern   Not on file  Social History Narrative   Patient is married Tax inspector) and lives at home with her husband and two children.   Patient has three children.   Patient works at [employer].   Patient has a Master's degree.   Patient is left-handed.   Patient drinks 6 cups of coffee daily.   Social Determinants of Health   Financial Resource Strain: Not on file  Food Insecurity: Not on file  Transportation Needs: Not on file  Physical Activity: Not on file  Stress: Not on file  Social Connections: Not on file  Intimate Partner Violence: Not on file     Past Medical History, Surgical history, Social history, and Family history were reviewed and updated as appropriate.   Please see review of systems for further details on the patient's review from today.   Objective:   Physical Exam:  LMP 12/27/2007 (Exact Date)   Physical Exam Neurological:     Mental Status: She is alert and oriented to person, place, and time.     Cranial Nerves: No dysarthria.  Psychiatric:        Attention and Perception: Attention and perception normal.        Mood and Affect: Mood is anxious and depressed.        Speech: Speech normal.        Behavior: Behavior is cooperative.        Thought Content: Thought content normal. Thought content is not paranoid or delusional. Thought content does not include homicidal or suicidal ideation. Thought content does not include homicidal or suicidal plan.        Cognition and Memory: Cognition and memory normal.        Judgment: Judgment normal.     Comments: Insight intact     Lab Review:     Component Value Date/Time   NA 137 11/28/2020 0733   K 4.0 11/28/2020 0733   CL 101 11/28/2020 0733   CO2 26 11/28/2020 0733   GLUCOSE 89 11/28/2020 0733   BUN 15 11/28/2020 0733   CREATININE 0.60 11/28/2020 0733   CREATININE 0.68 01/26/2020 0920   CALCIUM 9.8 11/28/2020 0733   PROT 6.9 11/28/2020 0733   ALBUMIN 4.6 11/28/2020 0733   AST 50 (H) 11/28/2020 0733   ALT 81 (H) 11/28/2020 0733   ALKPHOS 64 11/28/2020 0733   BILITOT 0.4 11/28/2020 0733   GFRNONAA >90 05/01/2014 1645   GFRAA >90 05/01/2014 1645       Component Value Date/Time   WBC 3.7 (L) 11/28/2020 0733   RBC 3.95 11/28/2020 0733   HGB 13.2 11/28/2020 0733   HGB 14.1 10/26/2014 1034   HCT 38.1 11/28/2020 0733   PLT 257.0 11/28/2020 0733   MCV 96.6 11/28/2020 0733   MCH 34.4 (H) 01/26/2020 0920   MCHC 34.6 11/28/2020 0733   RDW 12.4 11/28/2020 0733  LYMPHSABS 1.1 11/28/2020 0733   MONOABS 0.3 11/28/2020 0733   EOSABS 0.1 11/28/2020 0733    BASOSABS 0.1 11/28/2020 0733    No results found for: "POCLITH", "LITHIUM"   No results found for: "PHENYTOIN", "PHENOBARB", "VALPROATE", "CBMZ"   .res Assessment: Plan:    Pt seen for 30 minutes and time spent discussing initiation of ECT treatments and changes to medication. Will decrease and discontinue Latuda due to no significant improvement in depression.  Discussed treatment options for depression and discussed re-starting a medication that has been effective or partially effective in the past. Agreed to re-start Effexor XR. Will start Effexor XR 75 mg daily for a week, then increase to 150 mg daily for depression and anxiety.  Recommended she contact ECT clinic regarding whether she should hold Klonopin and Dayvigo the nights before ECT treatments. Will continue Klonopin 0.5 mg po QHS and Dayvigo 10 mg po qhs for insomnia.  Continue Concerta 36 mg po BID for ADHD.  Continue Trazodone 100-200 mg po QHS for insomnia.  Pt to follow-up in 2-4 weeks or sooner if clinically indicated.  Patient advised to contact office with any questions, adverse effects, or acute worsening in signs and symptoms.   Heather Davila was seen today for depression.  Diagnoses and all orders for this visit:  Recurrent major depression resistant to treatment ([facility]) -     venlafaxine XR (EFFEXOR XR) 75 MG 24 hr capsule; Take 1 capsule daily in the morning for one week, then increase to 2 capsules daily as tolerated -     venlafaxine XR (EFFEXOR XR) 150 MG 24 hr capsule; Take 1 capsule (150 mg total) by mouth daily with breakfast. -     lurasidone (LATUDA) 20 MG TABS tablet; Decrease to 20 mg at bedtime for 3 nights, then stop  Anxiety  Insomnia, unspecified type     Please see After Visit Summary for patient specific instructions.  Future Appointments  Date Time Provider Katherine  03/27/2022  1:15 PM Megan Salon, MD [facility]  04/01/2022  9:00 AM [name], PMHNP [facility] None    No  orders of the defined types were placed in this encounter.     -------------------------------

## 2022-03-14 ENCOUNTER — Telehealth: Payer: 59 | Admitting: Psychiatry

## 2022-03-24 MED ORDER — VENLAFAXINE HCL ER 75 MG PO CP24: ORAL_CAPSULE | ORAL | 1 refills | Status: DC

## 2022-03-27 ENCOUNTER — Ambulatory Visit (HOSPITAL_BASED_OUTPATIENT_CLINIC_OR_DEPARTMENT_OTHER): Payer: Managed Care, Other (non HMO) | Admitting: Obstetrics & Gynecology

## 2022-04-01 ENCOUNTER — Telehealth (INDEPENDENT_AMBULATORY_CARE_PROVIDER_SITE_OTHER): Payer: 59 | Admitting: Psychiatry

## 2022-04-01 ENCOUNTER — Encounter: Payer: Self-pay | Admitting: Psychiatry

## 2022-04-01 ENCOUNTER — Telehealth: Payer: Self-pay | Admitting: Psychiatry

## 2022-04-01 DIAGNOSIS — G4700 Insomnia, unspecified: Secondary | ICD-10-CM | POA: Diagnosis not present

## 2022-04-01 DIAGNOSIS — F339 Major depressive disorder, recurrent, unspecified: Secondary | ICD-10-CM

## 2022-04-01 DIAGNOSIS — F419 Anxiety disorder, unspecified: Secondary | ICD-10-CM | POA: Diagnosis not present

## 2022-04-01 DIAGNOSIS — F900 Attention-deficit hyperactivity disorder, predominantly inattentive type: Secondary | ICD-10-CM

## 2022-04-01 MED ORDER — CAPLYTA 42 MG PO CAPS: 42.0000 mg | ORAL_CAPSULE | Freq: Every day | ORAL | 0 refills | Status: DC

## 2022-04-01 MED ORDER — METHYLPHENIDATE HCL ER (OSM) 36 MG PO TBCR: 36.0000 mg | EXTENDED_RELEASE_TABLET | Freq: Two times a day (BID) | ORAL | 0 refills | Status: DC

## 2022-04-01 MED ORDER — VENLAFAXINE HCL ER 150 MG PO CP24: 300.0000 mg | ORAL_CAPSULE | Freq: Every day | ORAL | 0 refills | Status: DC

## 2022-04-01 NOTE — Telephone Encounter (Signed)
Received FMLA forms. Given to [facility] 12/5

## 2022-04-01 NOTE — Progress Notes (Unsigned)
Heather Davila [id number] 1961/04/03 61 y.o.  Virtual Visit via Video Note  I connected with pt @ on 04/01/22 at  9:00 AM EST by a video enabled telemedicine application and verified that I am speaking with the correct person using two identifiers.   I discussed the limitations of evaluation and management by telemedicine and the availability of in person appointments. The patient expressed understanding and agreed to proceed.  I discussed the assessment and treatment plan with the patient. The patient was provided an opportunity to ask questions and all were answered. The patient agreed with the plan and demonstrated an understanding of the instructions.   The patient was advised to call back or seek an in-person evaluation if the symptoms worsen or if the condition fails to improve as anticipated.  I provided 30 minutes of non-face-to-face time during this encounter.  The patient was located at home.  The provider was located at [place].   [name], PMHNP   Subjective:   Patient ID:  Heather Davila is a 61 y.o. (DOB [birth year]) female.  Chief Complaint:  Chief Complaint  Patient presents with   Depression    HPI Jenifer Struve presents for follow-up of depression, anxiety, insomnia, and ADHD.   She reports that she cancelled upcoming ECT treatments since she does not notice any benefit and that ECT treatments are negatively affecting her memory. She reports that she cannot remember how to login to her computer and if she filed retirement paperwork. Her plan was to return to work on 04/11/22 and now feels that she is unable to do so. She has an apt on 04/08/22 with ECT providers.   She reports that she continues to have severe depression. Mood is consistently depressed. She is now having some anxiety in response to recent stressors and memory issues. Some panic. Some irritability. She reports adequate sleep. She resumed Gabapentin since she stopped ECT and reports  improved sleep. Appetite has been good. Energy and motivation have been low. She reports difficulty with concentration. She reports diminished interest and anhedonia. She reports that she has been socially withdrawn. Denies SI.   Denies any change with re-start in Effexor XR.   Klonopin last filled 90/30/09 Concerta last filled 23/30/07 Dayvigo last filled 02/21/22  Past Psychiatric Medication Trials: Trintellix - Initially effective and then was not effective when re-started Paxil- Ineffective. Helped initially. Prozac-Insomnia Lexapro- Effective and then no longer as effective Zoloft- Initially effective and then not as effective.  Viibryd Cymbalta- Ineffective. Helped initially. Effexor XR- Effective, well tolerated. Pristiq- Increased anxiety at 150 mg daily Wellbutrin XL-Ineffective.May have increased anxiety. Amitriptyline Nortriptyline-Caused irritability, constipation Auvelity- ineffective Abilify- Helpful but caused severe insomnia. Rexulti-Helpful but caused insomnia. Vraylar Seroquel - Ineffective Risperdal Olanzapine- Ineffective Latuda- Ineffective Klonopin- Effective Temazepam- Took during menopause Lunesta- Ineffective Ambien-Ineffective Sonata- Ineffective Trazodone- Ineffective Remeron-Ineffective. Does not recall wt gain.  Adderall- Took once and had adverse effect. Concerta- Effective Metadate-Not as effective as Concerta Dexmethylphenidate- Not as effective as Concerta Azstarys- some side effects. Not as effective.  Lamictal- May have had an adverse effects. "Felt weird." Reports worsening s/s.  Lithium Gabapentin Pramipexole- "felt really weird."   Review of Systems:  Review of Systems  Gastrointestinal: Negative.   Musculoskeletal:  Negative for gait problem.  Neurological:  Negative for tremors and headaches.  Psychiatric/Behavioral:         Please refer to HPI    Medications: I have reviewed the patient's current medications.  Current  Outpatient Medications  Medication Sig  Dispense Refill   doxycycline (VIBRAMYCIN) 50 MG capsule Take 50 mg by mouth daily.     estradiol (ESTRACE) 0.1 MG/GM vaginal cream USE 1 GM VAGINALLY TWICE A WEEK AT BEDTIME 42.5 g 0   gabapentin (NEURONTIN) 800 MG tablet Take 1 tablet (800 mg total) by mouth at bedtime. 90 tablet 1   Lemborexant (DAYVIGO) 10 MG TABS Take 1/2-1 tab po QHS 30 tablet 1   lumateperone tosylate (CAPLYTA) 42 MG capsule Take 1 capsule (42 mg total) by mouth daily. 28 capsule 0   MAGNESIUM PO Take by mouth.     Multiple Vitamin (MULTIVITAMIN) tablet Take 1 tablet by mouth daily.     traZODone (DESYREL) 100 MG tablet Take 1 tablet (100 mg total) by mouth at bedtime. Take 1-2 tabs po QHS prn 180 tablet 1   clonazePAM (KLONOPIN) 0.5 MG tablet Take 1 tablet (0.5 mg total) by mouth at bedtime. 30 tablet 2   gabapentin (NEURONTIN) 100 MG capsule Take 1-3 capsules by mouth daily as needed (Patient not taking: Reported on 03/13/2022) 270 capsule 1   ibuprofen (ADVIL,MOTRIN) 200 MG tablet Take 200 mg by mouth every 6 (six) hours as needed.     [START ON 04/21/2022] methylphenidate 36 MG PO CR tablet Take 1 tablet (36 mg total) by mouth 2 (two) times daily with breakfast and lunch. 60 tablet 0   ondansetron (ZOFRAN) 4 MG tablet Take 1 tablet (4 mg total) by mouth every 8 (eight) hours as needed for nausea or vomiting. (Patient not taking: Reported on 01/31/2022) 20 tablet 0   valACYclovir (VALTREX) 500 MG tablet Take one twice daily for 3 days with any symptoms 30 tablet 3   venlafaxine XR (EFFEXOR XR) 150 MG 24 hr capsule Take 2 capsules (300 mg total) by mouth daily with breakfast. 60 capsule 0   No current facility-administered medications for this visit.    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   TRANSAMINASES, SERUM, ELEVATED 08/14/2009    Family History  Problem Relation Age of Onset   Cancer Mother 54       colon cancer   Hypertension Mother    Colon cancer Mother 11       colon rescection   Colon polyps Mother    Hypertension Father    Heart disease Father 72       CABG62   Stroke Father 42   Heart disease Sister        atrial fibrillation   Heart attack Sister 32       no stents   Depression Sister    Heart failure Paternal Aunt    Diabetes Paternal Uncle    Heart disease Paternal Grandfather    Esophageal cancer Neg Hx    Rectal cancer Neg Hx    Stomach cancer Neg Hx     Social History   Socioeconomic History   Marital status: Married    Spouse name: [name]   Number of children: 3   Years of education: Brewing technologist  Highest education level: Not on file  Occupational History   Occupation: Nurse    Employer: [employer]  Tobacco Use   Smoking status: Former    Packs/day: 0.50    Years: 5.00    Total pack years: 2.50    Types: Cigarettes    Quit date: 07/10/1987    Years since quitting: 34.7   Smokeless tobacco: Never  Vaping Use   Vaping Use: Never used  Substance and Sexual Activity   Alcohol use: No   Drug use: No   Sexual activity: Yes    Partners: Male    Birth control/protection: Post-menopausal, Other-see comments    Comment: vasectomy  Other Topics Concern   Not on file  Social History Narrative   Patient is married Tax inspector) and lives at home with her husband and two children.   Patient has three children.   Patient works at [employer].   Patient has a Master's degree.   Patient is left-handed.   Patient drinks 6 cups of coffee daily.   Social Determinants of Health   Financial Resource Strain: Not on file   Food Insecurity: Not on file  Transportation Needs: Not on file  Physical Activity: Not on file  Stress: Not on file  Social Connections: Not on file  Intimate Partner Violence: Not on file    Past Medical History, Surgical history, Social history, and Family history were reviewed and updated as appropriate.   Please see review of systems for further details on the patient's review from today.   Objective:   Physical Exam:  LMP 12/27/2007 (Exact Date)   Physical Exam Constitutional:      General: She is not in acute distress. Musculoskeletal:        General: No deformity.  Neurological:     Mental Status: She is alert and oriented to person, place, and time.     Coordination: Coordination normal.  Psychiatric:        Attention and Perception: Attention and perception normal. She does not perceive auditory or visual hallucinations.        Mood and Affect: Mood is anxious and depressed. Affect is blunt. Affect is not labile, angry or inappropriate.        Speech: Speech normal.        Behavior: Behavior normal.        Thought Content: Thought content normal. Thought content is not paranoid or delusional. Thought content does not include homicidal or suicidal ideation. Thought content does not include homicidal or suicidal plan.        Cognition and Memory: Cognition normal. Memory is impaired.        Judgment: Judgment normal.     Comments: Insight intact Unable to remember if she completed retirement paperwork or process for FMLA     Lab Review:     Component Value Date/Time   NA 137 11/28/2020 0733   K 4.0 11/28/2020 0733   CL 101 11/28/2020 0733   CO2 26 11/28/2020 0733   GLUCOSE 89 11/28/2020 0733   BUN 15 11/28/2020 0733   CREATININE 0.60 11/28/2020 0733   CREATININE 0.68 01/26/2020 0920   CALCIUM 9.8 11/28/2020 0733   PROT 6.9 11/28/2020 0733   ALBUMIN 4.6 11/28/2020 0733   AST 50 (H) 11/28/2020 0733   ALT 81 (H) 11/28/2020 0733   ALKPHOS 64 11/28/2020 0733    BILITOT 0.4 11/28/2020 0733   GFRNONAA >90 05/01/2014 1645   GFRAA >90 05/01/2014 1645  Component Value Date/Time   WBC 3.7 (L) 11/28/2020 0733   RBC 3.95 11/28/2020 0733   HGB 13.2 11/28/2020 0733   HGB 14.1 10/26/2014 1034   HCT 38.1 11/28/2020 0733   PLT 257.0 11/28/2020 0733   MCV 96.6 11/28/2020 0733   MCH 34.4 (H) 01/26/2020 0920   MCHC 34.6 11/28/2020 0733   RDW 12.4 11/28/2020 0733   LYMPHSABS 1.1 11/28/2020 0733   MONOABS 0.3 11/28/2020 0733   EOSABS 0.1 11/28/2020 0733   BASOSABS 0.1 11/28/2020 0733    No results found for: "POCLITH", "LITHIUM"   No results found for: "PHENYTOIN", "PHENOBARB", "VALPROATE", "CBMZ"   .res Assessment: Plan:    Pt seen for 30 minutes and time spent discussing response to ECT and other possible treatment options. Pt reports that she does not plan to resume ECT due to memory impairment and not experiencing any benefit with treatment. She reports that she would like to start a medication that she has not tried previously. Reviewed past med trials. She has tried most Financial risk analyst. TCA's have been poorly tolerated. She has had some time limited improvement with partial dopamine agonist with side effect of insomnia. Discussed trial of Capltya off-label since it has a different mechanism of action from other medications that she has tried. Pt agrees to trial of Caplyta 42 mg daily. Discussed starting trial of Caplyta with samples and then considering sending a script if Caplyta is helpful for mood.  Will increase Effexor XR to 300 mg po qd for depression and anxiety.  Discussed potential benefits of pharmacogenetics testing and reviewed sample report with patient.  Patient consents to pharmacogenetic testing and saliva sample obtained at time of exam. Continue Klonopin 0.5 mg po QHS for anxiety and insomnia.  Continue Gabapentin 800 mg po QHS for insomnia and anxiety. Continue Gabapentin 100 mg 1-3 capsules as needed for insomnia.   Continue Dayvigo 10 mg po QHS for insomnia.  Recommend extending leave of absence for 4 more weeks with tentative RTW date of 05/01/22.  Pt to follow-up with this provider in 2-3 weeks or sooner if clinically indicated.  Patient advised to contact office with any questions, adverse effects, or acute worsening in signs and symptoms.   Paxtyn was seen today for depression.  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  Attention deficit hyperactivity disorder (ADHD), predominantly inattentive type -     methylphenidate 36 MG PO CR tablet; Take 1 tablet (36 mg total) by mouth 2 (two) times daily with breakfast and lunch.  Anxiety  Insomnia, unspecified type  Other orders -     lumateperone tosylate (CAPLYTA) 42 MG capsule; Take 1 capsule (42 mg total) by mouth daily.     Please see After Visit Summary for patient specific instructions.  Future Appointments  Date Time Provider [place]  04/17/2022  8:30 AM [name], PMHNP [facility] None  05/15/2022  1:35 PM Megan Salon, MD [facility]  08/18/2022  2:35 PM Megan Salon, MD [facility]    No orders of the defined types were placed in this encounter.     -------------------------------

## 2022-04-02 NOTE — Telephone Encounter (Signed)
Error

## 2022-04-09 ENCOUNTER — Telehealth: Payer: Self-pay

## 2022-04-09 NOTE — Telephone Encounter (Signed)
Prior Authorization submitted for Venlafaxine ER 150 mg capsules, 2 daily #60/30 day with [facility], pending response

## 2022-04-10 NOTE — Telephone Encounter (Signed)
Approval received effective through 04/09/2023 for Venlafaxine ER 150 mg capsules 2 daily, PA# [id number] with [facility]

## 2022-04-14 ENCOUNTER — Encounter: Payer: Self-pay | Admitting: Psychiatry

## 2022-04-17 ENCOUNTER — Encounter: Payer: Self-pay | Admitting: Psychiatry

## 2022-04-17 ENCOUNTER — Telehealth (INDEPENDENT_AMBULATORY_CARE_PROVIDER_SITE_OTHER): Payer: 59 | Admitting: Psychiatry

## 2022-04-17 DIAGNOSIS — G4700 Insomnia, unspecified: Secondary | ICD-10-CM | POA: Diagnosis not present

## 2022-04-17 DIAGNOSIS — F419 Anxiety disorder, unspecified: Secondary | ICD-10-CM | POA: Diagnosis not present

## 2022-04-17 DIAGNOSIS — F339 Major depressive disorder, recurrent, unspecified: Secondary | ICD-10-CM | POA: Diagnosis not present

## 2022-04-17 MED ORDER — CLONAZEPAM 0.5 MG PO TABS: ORAL_TABLET | ORAL | 1 refills | Status: DC

## 2022-04-17 MED ORDER — VENLAFAXINE HCL ER 75 MG PO CP24: ORAL_CAPSULE | ORAL | 1 refills | Status: DC

## 2022-04-17 MED ORDER — DAYVIGO 10 MG PO TABS: ORAL_TABLET | ORAL | 1 refills | Status: DC

## 2022-04-17 NOTE — Progress Notes (Signed)
Heather Davila [id number] [birth year] 61 y.o.  Virtual Visit via Video Note  I connected with pt @ on 04/17/22 at  8:30 AM EST by a video enabled telemedicine application and verified that I am speaking with the correct person using two identifiers.   I discussed the limitations of evaluation and management by telemedicine and the availability of in person appointments. The patient expressed understanding and agreed to proceed.  I discussed the assessment and treatment plan with the patient. The patient was provided an opportunity to ask questions and all were answered. The patient agreed with the plan and demonstrated an understanding of the instructions.   The patient was advised to call back or seek an in-person evaluation if the symptoms worsen or if the condition fails to improve as anticipated.  I provided 50 minutes of non-face-to-face time during this encounter.  The patient was located at home.  The provider was located at home.   [name], PMHNP   Subjective:   Patient ID:  Heather Davila is a 61 y.o. (DOB July 30, 1960) female.  Chief Complaint:  Chief Complaint  Patient presents with   Depression   Insomnia   Anxiety    HPI Heather Davila presents for follow-up of depression, anxiety, and insomnia. She reports that she had 12th ECT treatment yesterday and has noticed some recent improvement in depression. She reports "I was really discouraged because I wasn't seeing any improvement, but now I am more hopeful." She reports that she has had more energy and interest. "Before I didn't know anyone could be in a depressed state like that." She reports that before she would sit and stare at the wall without doing any other activities. She reports that she has been withdrawn from family and is starting to interact with others. Energy and motivation have improved some and are still low. She reports that she notices some changes in memory with ECT.   She reports that she was having  some increased anxiety and this has improved some since last ECT treatment. She reports that she has not noticed an improvement with antidepressants and reduced Effexor XR on her own to 150 mg daily for the past 1-2 days. She reports that recently she has been taking Concerta once daily on most days. She has been having difficulty sleeping. She has been needing Klonopin at night. She reports that Gabapentin was helping with her sleep and is not able to take it with ECT treatments. Appetite has been ok. Denies SI.   Her BP has been elevated recently, possibly starting 2 weeks ago. She reports that her last 2 BP readings were normal.   Dayvigo last filled 04/09/22. X 2 Klonopin last filled 03/25/22 x 2.  Concerta last filled 34/28/76.   Past Psychiatric Medication Trials: Trintellix - Initially effective and then was not effective when re-started Paxil- Ineffective. Helped initially. Prozac-Insomnia Lexapro- Effective and then no longer as effective Zoloft- Initially effective and then not as effective.  Viibryd Cymbalta- Ineffective. Helped initially. Effexor XR- Effective, well tolerated. Pristiq- Increased anxiety at 150 mg daily Wellbutrin XL-Ineffective.May have increased anxiety. Amitriptyline Nortriptyline-Caused irritability, constipation Auvelity- ineffective Abilify- Helpful but caused severe insomnia. Rexulti-Helpful but caused insomnia. Vraylar Seroquel - Ineffective Risperdal Olanzapine- Ineffective Latuda- Ineffective Caplyta Klonopin- Effective Temazepam- Took during menopause Lunesta- Ineffective Ambien-Ineffective Sonata- Ineffective Dayvigo- partially effective Trazodone- Ineffective Remeron-Ineffective. Does not recall wt gain.  Adderall- Took once and had adverse effect. Concerta- Effective Metadate-Not as effective as Concerta Dexmethylphenidate- Not as effective as Concerta Azstarys-  some side effects. Not as effective.  Lamictal- May have had an adverse  effects. "Felt weird." Reports worsening s/s.  Lithium Gabapentin Pramipexole- "felt really weird."   Review of Systems:  Review of Systems  Musculoskeletal:  Negative for gait problem.  Neurological:  Positive for headaches. Negative for tremors.  Psychiatric/Behavioral:         Please refer to HPI    Medications: I have reviewed the patient's current medications.  Current Outpatient Medications  Medication Sig Dispense Refill   doxycycline (VIBRAMYCIN) 50 MG capsule Take 50 mg by mouth daily.     estradiol (ESTRACE) 0.1 MG/GM vaginal cream USE 1 GM VAGINALLY TWICE A WEEK AT BEDTIME 42.5 g 0   ibuprofen (ADVIL,MOTRIN) 200 MG tablet Take 200 mg by mouth every 6 (six) hours as needed.     lumateperone tosylate (CAPLYTA) 42 MG capsule Take 1 capsule (42 mg total) by mouth daily. 28 capsule 0   MAGNESIUM PO Take by mouth.     [START ON 04/21/2022] methylphenidate 36 MG PO CR tablet Take 1 tablet (36 mg total) by mouth 2 (two) times daily with breakfast and lunch. (Patient taking differently: Take 36 mg by mouth daily.) 60 tablet 0   Multiple Vitamin (MULTIVITAMIN) tablet Take 1 tablet by mouth daily.     traZODone (DESYREL) 100 MG tablet Take 1 tablet (100 mg total) by mouth at bedtime. Take 1-2 tabs po QHS prn 180 tablet 1   clonazePAM (KLONOPIN) 0.5 MG tablet Take 1-1.5 tabs at bedtime and 1/2-1 tab as needed for anxiety. 60 tablet 1   [START ON 05/07/2022] Lemborexant (DAYVIGO) 10 MG TABS Take 1/2-1 tab po QHS 30 tablet 1   ondansetron (ZOFRAN) 4 MG tablet Take 1 tablet (4 mg total) by mouth every 8 (eight) hours as needed for nausea or vomiting. (Patient not taking: Reported on 01/31/2022) 20 tablet 0   valACYclovir (VALTREX) 500 MG tablet Take one twice daily for 3 days with any symptoms 30 tablet 3   venlafaxine XR (EFFEXOR XR) 75 MG 24 hr capsule Take 150 mg daily for 5 days, then 75 mg daily for 5 days, then stop 30 capsule 1   No current facility-administered medications for this  visit.    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   TRANSAMINASES, SERUM, ELEVATED 08/14/2009    Family History  Problem Relation Age of Onset   Cancer Mother 55       colon cancer   Hypertension Mother    Colon cancer Mother 76       colon rescection   Colon polyps Mother    Hypertension Father    Heart disease Father 81       CABG62   Stroke Father 62   Heart disease Sister        atrial fibrillation   Heart attack Sister 12       no stents   Depression Sister    Heart failure Paternal Aunt    Diabetes Paternal Uncle  Heart disease Paternal Grandfather    Esophageal cancer Neg Hx    Rectal cancer Neg Hx    Stomach cancer Neg Hx     Social History   Socioeconomic History   Marital status: Married    Spouse name: Charles   Number of children: 3   Years of education: Master's   Highest education level: Not on file  Occupational History   Occupation: Optician, dispensing: [employer]  Tobacco Use   Smoking status: Former    Packs/day: 0.50    Years: 5.00    Total pack years: 2.50    Types: Cigarettes    Quit date: 07/10/1987    Years since quitting: 34.7   Smokeless tobacco: Never  Vaping Use   Vaping Use: Never used  Substance and Sexual Activity   Alcohol use: No   Drug use: No   Sexual activity: Yes    Partners: Male    Birth control/protection: Post-menopausal, Other-see comments    Comment: vasectomy  Other Topics Concern   Not on file  Social History Narrative   Patient is married Tax inspector) and lives at home with her husband and two children.   Patient  has three children.   Patient works at [employer].   Patient has a Master's degree.   Patient is left-handed.   Patient drinks 6 cups of coffee daily.   Social Determinants of Health   Financial Resource Strain: Not on file  Food Insecurity: Not on file  Transportation Needs: Not on file  Physical Activity: Not on file  Stress: Not on file  Social Connections: Not on file  Intimate Partner Violence: Not on file    Past Medical History, Surgical history, Social history, and Family history were reviewed and updated as appropriate.   Please see review of systems for further details on the patient's review from today.   Objective:   Physical Exam:  LMP 12/27/2007 (Exact Date)   Physical Exam Neurological:     Mental Status: She is alert and oriented to person, place, and time.     Cranial Nerves: No dysarthria.  Psychiatric:        Attention and Perception: Attention and perception normal.        Mood and Affect: Mood is depressed. Mood is not anxious.        Speech: Speech normal.        Behavior: Behavior is cooperative.        Thought Content: Thought content normal. Thought content is not paranoid or delusional. Thought content does not include homicidal or suicidal ideation. Thought content does not include homicidal or suicidal plan.        Cognition and Memory: Cognition and memory normal.        Judgment: Judgment normal.     Comments: Insight intact Mood presents as less depressed and anxious compared to most recent exams. Speech is more spontaneous.      Lab Review:     Component Value Date/Time   NA 137 11/28/2020 0733   K 4.0 11/28/2020 0733   CL 101 11/28/2020 0733   CO2 26 11/28/2020 0733   GLUCOSE 89 11/28/2020 0733   BUN 15 11/28/2020 0733   CREATININE 0.60 11/28/2020 0733   CREATININE 0.68 01/26/2020 0920   CALCIUM 9.8 11/28/2020 0733   PROT 6.9 11/28/2020 0733   ALBUMIN 4.6 11/28/2020 0733   AST 50 (H) 11/28/2020 0733   ALT 81 (H)  11/28/2020 3086  ALKPHOS 64 11/28/2020 0733   BILITOT 0.4 11/28/2020 0733   GFRNONAA >90 05/01/2014 1645   GFRAA >90 05/01/2014 1645       Component Value Date/Time   WBC 3.7 (L) 11/28/2020 0733   RBC 3.95 11/28/2020 0733   HGB 13.2 11/28/2020 0733   HGB 14.1 10/26/2014 1034   HCT 38.1 11/28/2020 0733   PLT 257.0 11/28/2020 0733   MCV 96.6 11/28/2020 0733   MCH 34.4 (H) 01/26/2020 0920   MCHC 34.6 11/28/2020 0733   RDW 12.4 11/28/2020 0733   LYMPHSABS 1.1 11/28/2020 0733   MONOABS 0.3 11/28/2020 0733   EOSABS 0.1 11/28/2020 0733   BASOSABS 0.1 11/28/2020 0733    No results found for: "POCLITH", "LITHIUM"   No results found for: "PHENYTOIN", "PHENOBARB", "VALPROATE", "CBMZ"   .res Assessment: Plan:    Pt seen for 50 minutes and time spent discussing response to ECT treatments and medication changes, as well as reviewing pharmacogenetics testing results and discussing implications for treatment. Discussed that overall she has had very limited benefit from multiple psychiatric med trials and is starting to notice some improvement in symptoms with ECT. Recommended reducing polypharmacy due to limited benefit and possible side effects to include increased BP and sleep disturbance. Recommend taking Concerta 36 mg once daily to possibly reduce BP and sleep disturbance.  Recommend decreasing and discontinuing Effexor XR since this has not been helpful for her depressive s/s and may be elevating BP and possibly contributing to sleep disturbance. She reports that she decreased dose on her own to 150 mg daily a couple of days ago and reports that she would like to stop Effexor. She reports that she has been able to stop Effexor XR in the past without noticeable discontinuation s/s. Recommended not stopping Effexor XR abruptly to minimize risk of discontinuation symptoms and taking 150 mg daily for 5 days, then 75 mg daily for 5 days, and then stopping.  Discussed that she could take Klonopin  0.5 mg 1-1.5 tabs at bedtime to help with insomnia since insomnia is worse after stopping Gabapentin during ECT treatments. Discussed that her sleep may improve with decreases in Concerta and Effexor, and to use lowest possible effective dose of Klonopin to minimize risk of tolerance and dependence.  Will continue Dayvigo 10 mg po QHS for insomnia since she reports that this is helpful for sleep quality.  Will continue Caplyta at this time and will consider future discontinuation.  Continue Trazodone 100-200 mg at bedtime for insomnia.  Pt to follow-up with this provider in 3-4 weeks or sooner if clinically indicated.  She reports that she may be interested in re-starting therapy and would like to try a different therapy approach than EMDR. Provider will assist patient in referral process and has staffed case with potential therapist within the practice. Patient advised to contact office with any questions, adverse effects, or acute worsening in signs and symptoms.   Heather Davila was seen today for depression, insomnia and anxiety.  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  Insomnia, unspecified type -     clonazePAM (KLONOPIN) 0.5 MG tablet; Take 1-1.5 tabs at bedtime  and 1/2-1 tab as needed for anxiety. -     Lemborexant (DAYVIGO) 10 MG TABS; Take 1/2-1 tab po QHS     Please see After Visit Summary for patient specific instructions.  Future Appointments  Date Time Provider [place]  04/22/2022  9:00 AM Eulas Post, MD [facility]  05/09/2022 11:30 AM [name], PMHNP [facility] None  05/15/2022  1:35 PM Megan Salon, MD [facility]  08/18/2022  2:35 PM Megan Salon, MD [facility]  [facility]    No orders of the defined types were placed in this encounter.     -------------------------------

## 2022-04-22 ENCOUNTER — Ambulatory Visit (INDEPENDENT_AMBULATORY_CARE_PROVIDER_SITE_OTHER): Payer: Managed Care, Other (non HMO) | Admitting: Family Medicine

## 2022-04-22 ENCOUNTER — Encounter: Payer: Self-pay | Admitting: Family Medicine

## 2022-04-22 VITALS — BP 120/70 | HR 70 | Temp 97.6°F | Ht 59.84 in | Wt 91.3 lb

## 2022-04-22 DIAGNOSIS — Z0000 Encounter for general adult medical examination without abnormal findings: Secondary | ICD-10-CM

## 2022-04-22 DIAGNOSIS — R748 Abnormal levels of other serum enzymes: Secondary | ICD-10-CM | POA: Diagnosis not present

## 2022-04-22 LAB — BASIC METABOLIC PANEL
BUN: 10 mg/dL (ref 6–23)
CO2: 31 mEq/L (ref 19–32)
Calcium: 10.5 mg/dL (ref 8.4–10.5)
Chloride: 98 mEq/L (ref 96–112)
Creatinine, Ser: 0.71 mg/dL (ref 0.40–1.20)
GFR: 92.67 mL/min (ref >60.00)
Glucose, Bld: 102 mg/dL — ABNORMAL HIGH (ref 70–99)
Potassium: 4.1 mEq/L (ref 3.5–5.1)
Sodium: 138 mEq/L (ref 135–145)

## 2022-04-22 LAB — CBC WITH DIFFERENTIAL/PLATELET
Basophils Absolute: 0 10*3/uL (ref 0.0–0.1)
Basophils Relative: 1 % (ref 0.0–3.0)
Eosinophils Absolute: 0.1 10*3/uL (ref 0.0–0.7)
Eosinophils Relative: 3.7 % (ref 0.0–5.0)
HCT: 40.7 % (ref 36.0–46.0)
Hemoglobin: 14.3 g/dL (ref 12.0–15.0)
Lymphocytes Relative: 27 % (ref 12.0–46.0)
Lymphs Abs: 0.9 10*3/uL (ref 0.7–4.0)
MCHC: 35.1 g/dL (ref 30.0–36.0)
MCV: 95.7 fl (ref 78.0–100.0)
Monocytes Absolute: 0.2 10*3/uL (ref 0.1–1.0)
Monocytes Relative: 6 % (ref 3.0–12.0)
Neutro Abs: 2 10*3/uL (ref 1.4–7.7)
Neutrophils Relative %: 62.3 % (ref 43.0–77.0)
Platelets: 245 10*3/uL (ref 150.0–400.0)
RBC: 4.25 Mil/uL (ref 3.87–5.11)
RDW: 13.2 % (ref 11.5–15.5)
WBC: 3.3 10*3/uL — ABNORMAL LOW (ref 4.0–10.5)

## 2022-04-22 LAB — HEPATIC FUNCTION PANEL
ALT: 118 U/L — ABNORMAL HIGH (ref 0–35)
AST: 82 U/L — ABNORMAL HIGH (ref 0–37)
Albumin: 5.3 g/dL — ABNORMAL HIGH (ref 3.5–5.2)
Alkaline Phosphatase: 74 U/L (ref 39–117)
Bilirubin, Direct: 0 mg/dL (ref 0.0–0.3)
Total Bilirubin: 0.3 mg/dL (ref 0.2–1.2)
Total Protein: 8.3 g/dL (ref 6.0–8.3)

## 2022-04-22 LAB — LIPID PANEL
Cholesterol: 262 mg/dL — ABNORMAL HIGH (ref 0–200)
HDL: 51.3 mg/dL (ref >39.00)
LDL Cholesterol: 178 mg/dL — ABNORMAL HIGH (ref 0–99)
NonHDL: 210.4
Total CHOL/HDL Ratio: 5
Triglycerides: 160 mg/dL — ABNORMAL HIGH (ref 0.0–149.0)
VLDL: 32 mg/dL (ref 0.0–40.0)

## 2022-04-22 LAB — TSH: TSH: 2.64 u[IU]/mL (ref 0.35–5.50)

## 2022-04-22 NOTE — Progress Notes (Signed)
Established Patient Office Visit  Subjective   Patient ID: Heather Davila, female    DOB: 1960-07-30  Age: 61 y.o. MRN: [id number]  Chief Complaint  Patient presents with   Annual Exam    HPI   Heather Davila is seen for physical exam.  She had very challenging year with regard to her mental health.  She is followed by [facility] psychiatry and has been getting ECT treatments after failing several medications.  She does see GYN regularly and states that she is up-to-date with regard to Pap smear but is not sure regarding exact date of last Pap smear.  She states her mammograms are also up-to-date.  Last colonoscopy was 2020 with recommended 3-year follow-up.  She has not heard recently from GI.  Declines flu vaccine.  Tetanus up-to-date.  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   TRANSAMINASES, SERUM, ELEVATED 08/14/2009   Past Surgical History:  Procedure Laterality Date   AUGMENTATION MAMMAPLASTY Bilateral    BREAST ENHANCEMENT SURGERY Bilateral    BUNIONECTOMY     CESAREAN SECTION     X 3   FACELIFT  2022   HYSTEROSCOPY  12/27/2007   and HTA   UPPER GASTROINTESTINAL ENDOSCOPY  2020    reports that she quit smoking about 34 years ago. Her smoking use included cigarettes. She has a 2.50 pack-year smoking history. She has never used smokeless tobacco. She reports that she does not drink alcohol and does not use drugs. family history includes Cancer (age of onset: 104) in her mother; Colon cancer (age of onset: 64) in her mother; Colon polyps in her mother; Depression in her sister; Diabetes in her paternal uncle; Heart attack (age of onset: 33) in her sister; Heart disease in her paternal grandfather and sister; Heart disease (age of onset: 28) in her father; Heart  failure in her paternal aunt; Hypertension in her father and mother; Stroke (age of onset: 8) in her father. Allergies  Allergen Reactions   Ciprofloxacin     REACTION: hives   Oxycodone-Acetaminophen     REACTION: hives    Review of Systems  Constitutional:  Negative for chills, fever, malaise/fatigue and weight loss.  HENT:  Negative for hearing loss.   Eyes:  Negative for blurred vision and double vision.  Respiratory:  Negative for cough and shortness of breath.   Cardiovascular:  Negative for chest pain, palpitations and leg swelling.  Gastrointestinal:  Negative for abdominal pain, blood in stool, constipation and diarrhea.  Genitourinary:  Negative for dysuria.  Skin:  Negative for rash.  Neurological:  Negative for dizziness, speech change, seizures, loss of consciousness and headaches.  Psychiatric/Behavioral:  Negative for depression.       Objective:     BP 120/70 (BP Location: Left Arm, Patient Position: Sitting, Cuff Size: Normal)   Pulse 70   Temp 97.6 F (36.4 C) (Oral)   Ht 4' 11.84" (1.52 m)   Wt 91 lb 4.8 oz (41.4 kg)   LMP 12/27/2007 (Exact Date)   SpO2 99%   BMI 17.92 kg/m  BP Readings from Last 3 Encounters:  04/22/22 120/70  01/10/22 104/67  11/28/20 110/60   Wt Readings from Last 3 Encounters:  04/22/22 91  lb 4.8 oz (41.4 kg)  01/10/22 93 lb 6.4 oz (42.4 kg)  11/28/20 94 lb 8 oz (42.9 kg)      Physical Exam Vitals reviewed.  Constitutional:      Appearance: She is well-developed.  HENT:     Head: Normocephalic and atraumatic.  Eyes:     Pupils: Pupils are equal, round, and reactive to light.  Neck:     Thyroid: No thyromegaly.  Cardiovascular:     Rate and Rhythm: Normal rate and regular rhythm.     Heart sounds: Normal heart sounds. No murmur heard. Pulmonary:     Effort: No respiratory distress.     Breath sounds: Normal breath sounds. No wheezing or rales.  Abdominal:     General: Bowel sounds are normal. There is no  distension.     Palpations: Abdomen is soft. There is no mass.     Tenderness: There is no abdominal tenderness. There is no guarding or rebound.  Musculoskeletal:        General: Normal range of motion.     Cervical back: Normal range of motion and neck supple.     Right lower leg: No edema.     Left lower leg: No edema.  Lymphadenopathy:     Cervical: No cervical adenopathy.  Skin:    Findings: No rash.  Neurological:     Mental Status: She is alert and oriented to person, place, and time.     Cranial Nerves: No cranial nerve deficit.      No results found for any visits on 04/22/22.    The 10-year ASCVD risk score (Arnett DK, et al., 2019) is: 3.1%    Assessment & Plan:   Problem List Items Addressed This Visit   None Visit Diagnoses     Physical exam    -  Primary   Relevant Orders   Basic metabolic panel   Lipid panel   CBC with Differential/Platelet   TSH   Hepatic function panel     Heather Davila has history of recurrent depression has been resistant to multiple medications.  Currently seeing psychiatry for ECT treatments.  She is underweight for height with BMI less than 18.  We expressed her concern regarding her current weight and discussed healthy strategies to try to gain some weight and hopefully establish more consistent exercise  -She will check with GI regarding colonoscopy -She plans to continue follow-up with GYN regarding Pap smears and mammograms -Set up screening labs as above -We have recommended more consistent weightbearing exercise -High risk for osteoporosis.  Consider discussing DEXA scan with her GYN this year  No follow-ups on file.    Heather Littler, MD

## 2022-04-22 NOTE — Patient Instructions (Signed)
Contact Dr Ardis Hughs (GI) to confirm if repeat colonoscopy indicated at this time  Confirm date of last pap smear.

## 2022-04-23 NOTE — Addendum Note (Signed)
Addended by: Nilda Riggs on: 04/23/2022 09:20 AM   Modules accepted: Orders

## 2022-04-24 ENCOUNTER — Telehealth: Payer: Self-pay | Admitting: Psychiatry

## 2022-04-24 NOTE — Telephone Encounter (Signed)
Please advise 

## 2022-04-24 NOTE — Telephone Encounter (Signed)
Returned call to pt. She reports that tomorrow is ECT provider's last day and others will be rotating in place of usual provider and this is concerning to her. Encouraged her to talk with ECT provider about her concerns tomorrow. Discussed that it appears several providers have been involved in her care and usually there is a team approach, and she may continue to receive care from providers that are already familiar with her care and this would be helpful for continuity. Pt verbalized understanding.

## 2022-04-24 NOTE — Telephone Encounter (Signed)
Pt is currently getting treated for ECT at [facility]. Her doctor, Dr. Nelva Bush, is leaving tomorrow and she will only have fill in doctors from now on. Should she continue there or switch treatment to [facility] at this time? Worried about continuity of care and getting best care.

## 2022-04-29 ENCOUNTER — Telehealth (INDEPENDENT_AMBULATORY_CARE_PROVIDER_SITE_OTHER): Payer: 59 | Admitting: Psychiatry

## 2022-04-29 ENCOUNTER — Encounter: Payer: Self-pay | Admitting: Psychiatry

## 2022-04-29 DIAGNOSIS — F419 Anxiety disorder, unspecified: Secondary | ICD-10-CM | POA: Diagnosis not present

## 2022-04-29 DIAGNOSIS — G4700 Insomnia, unspecified: Secondary | ICD-10-CM

## 2022-04-29 DIAGNOSIS — F339 Major depressive disorder, recurrent, unspecified: Secondary | ICD-10-CM | POA: Diagnosis not present

## 2022-04-29 DIAGNOSIS — F900 Attention-deficit hyperactivity disorder, predominantly inattentive type: Secondary | ICD-10-CM

## 2022-04-29 MED ORDER — FETZIMA TITRATION 20 & 40 MG PO C4PK: EXTENDED_RELEASE_CAPSULE | ORAL | 0 refills | Status: DC

## 2022-04-29 NOTE — Progress Notes (Signed)
Heather Davila [id number] 1960-07-01 62 y.o.  Virtual Visit via Video Note  I connected with pt @ on 04/29/22 at 10:30 AM EST by a video enabled telemedicine application and verified that I am speaking with the correct person using two identifiers.   I discussed the limitations of evaluation and management by telemedicine and the availability of in person appointments. The patient expressed understanding and agreed to proceed.  I discussed the assessment and treatment plan with the patient. The patient was provided an opportunity to ask questions and all were answered. The patient agreed with the plan and demonstrated an understanding of the instructions.   The patient was advised to call back or seek an in-person evaluation if the symptoms worsen or if the condition fails to improve as anticipated.  I provided 35 minutes of non-face-to-face time during this encounter.  The patient was located at home.  The provider was located at home.   [name], PMHNP   Subjective:   Patient ID:  Heather Davila is a 62 y.o. (DOB 03/16/1961) female.  Chief Complaint:  Chief Complaint  Patient presents with   Depression   Anxiety    HPI Heather Davila presents for follow-up of depression, anxiety, and insomnia. She reports, "I feel like I am doing worse than ever." She reports, "all I do is sit and stare out the window. I feel like I have no memory." She reports some disorientation- "I don't even know where the grocery store is." She has another ECT treatment scheduled tomorrow. She reports that her energy and motivation at very low. She reports, "I have no life. I don't do anything." Continues to have anhedonia. "The only thing I look forward to is sleep. I wish I could sleep all the time." She reports adequate sleep with medications. She reports that she often feels "frightened." She reports some anxiety and panic. Denies change in appetite. Food is less enjoyable. She used to love to cook and no  longer enjoys cooking. Concentration has been poor. Low energy and motivation. Cannot recall the last time she walked her dog.   Denies SI. "I'm not suicidal, but I wish I was 62 yo and my life was just about over. But I would never do that to my family and because of my faith, but at the same time I don't want to live either.   She has now retired from work.   She reports that she is not sure if Caplyta has had an effect "but I am afraid to be without anything." She reports that she tolerated taper and discontinuation of Effexor XR.   Klonopin last filled 04/22/22 Dayvigo last filled 04/09/22. Concerta last filled 27/25/36.  Past Psychiatric Medication Trials: Trintellix - Initially effective and then was not effective when re-started Paxil- Ineffective. Helped initially. Prozac-Insomnia Lexapro- Effective and then no longer as effective Zoloft- Initially effective and then not as effective.  Viibryd Cymbalta- Ineffective. Helped initially. Effexor XR- Effective, well tolerated. Pristiq- Increased anxiety at 150 mg daily Wellbutrin XL-Ineffective.May have increased anxiety. Amitriptyline Nortriptyline-Caused irritability, constipation Auvelity- ineffective Abilify- Helpful but caused severe insomnia. Rexulti-Helpful but caused insomnia. Vraylar Seroquel - Ineffective Risperdal Olanzapine- Ineffective Latuda- Ineffective Caplyta Klonopin- Effective Temazepam- Took during menopause Lunesta- Ineffective Ambien-Ineffective Sonata- Ineffective Dayvigo- partially effective Trazodone- Ineffective Remeron-Ineffective. Does not recall wt gain.  Adderall- Took once and had adverse effect. Concerta- Effective Metadate-Not as effective as Concerta Dexmethylphenidate- Not as effective as Concerta Azstarys- some side effects. Not as effective.  Lamictal- May have  had an adverse effects. "Felt weird." Reports worsening s/s.  Lithium Gabapentin Pramipexole- "felt really weird."      Review of Systems:  Review of Systems  Musculoskeletal:  Negative for gait problem.  Neurological:  Negative for tremors.  Psychiatric/Behavioral:         Please refer to HPI    Medications: I have reviewed the patient's current medications.  Current Outpatient Medications  Medication Sig Dispense Refill   clonazePAM (KLONOPIN) 0.5 MG tablet Take 1-1.5 tabs at bedtime and 1/2-1 tab as needed for anxiety. 60 tablet 1   [START ON 05/07/2022] Lemborexant (DAYVIGO) 10 MG TABS Take 1/2-1 tab po QHS 30 tablet 1   Levomilnacipran HCl ER (FETZIMA TITRATION) 20 & 40 MG C4PK Use as directed 1 each 0   methylphenidate 36 MG PO CR tablet Take 1 tablet (36 mg total) by mouth 2 (two) times daily with breakfast and lunch. (Patient taking differently: Take 36 mg by mouth daily.) 60 tablet 0   traZODone (DESYREL) 100 MG tablet Take 1 tablet (100 mg total) by mouth at bedtime. Take 1-2 tabs po QHS prn 180 tablet 1   doxycycline (VIBRAMYCIN) 50 MG capsule Take 50 mg by mouth daily.     estradiol (ESTRACE) 0.1 MG/GM vaginal cream USE 1 GM VAGINALLY TWICE A WEEK AT BEDTIME 42.5 g 0   MAGNESIUM PO Take by mouth.     Multiple Vitamin (MULTIVITAMIN) tablet Take 1 tablet by mouth daily.     valACYclovir (VALTREX) 500 MG tablet Take one twice daily for 3 days with any symptoms 30 tablet 3   No current facility-administered medications for this visit.    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   TRANSAMINASES, SERUM, ELEVATED 08/14/2009    Family History  Problem Relation Age of Onset   Cancer Mother 16       colon  cancer   Hypertension Mother    Colon cancer Mother 32       colon rescection   Colon polyps Mother    Hypertension Father    Heart disease Father 55       CABG62   Stroke Father 75   Heart disease Sister        atrial fibrillation   Heart attack Sister 71       no stents   Depression Sister    Heart failure Paternal Aunt    Diabetes Paternal Uncle    Heart disease Paternal Grandfather    Esophageal cancer Neg Hx    Rectal cancer Neg Hx    Stomach cancer Neg Hx     Social History   Socioeconomic History   Marital status: Married    Spouse name: [name]   Number of children: 3   Years of education: Master's   Highest education level: Not on file  Occupational History   Occupation: Optician, dispensing: [employer]  Tobacco Use   Smoking status: Former    Packs/day: 0.50    Years: 5.00  Total pack years: 2.50    Types: Cigarettes    Quit date: 07/10/1987    Years since quitting: 34.8   Smokeless tobacco: Never  Vaping Use   Vaping Use: Never used  Substance and Sexual Activity   Alcohol use: No   Drug use: No   Sexual activity: Yes    Partners: Male    Birth control/protection: Post-menopausal, Other-see comments    Comment: vasectomy  Other Topics Concern   Not on file  Social History Narrative   Patient is married Tax inspector) and lives at home with her husband and two children.   Patient has three children.   Patient works at [employer].   Patient has a Master's degree.   Patient is left-handed.   Patient drinks 6 cups of coffee daily.   Social Determinants of Health   Financial Resource Strain: Not on file  Food Insecurity: Not on file  Transportation Needs: Not on file  Physical Activity: Not on file  Stress: Not on file  Social Connections: Not on file  Intimate Partner Violence: Not on file    Past Medical History, Surgical history, Social history, and Family history were reviewed and updated as appropriate.   Please  see review of systems for further details on the patient's review from today.   Objective:   Physical Exam:  LMP 12/27/2007 (Exact Date)   Physical Exam Neurological:     Mental Status: She is alert and oriented to person, place, and time.     Cranial Nerves: No dysarthria.  Psychiatric:        Attention and Perception: Attention and perception normal.        Mood and Affect: Mood is anxious and depressed. Affect is blunt.        Speech: Speech normal.        Behavior: Behavior is cooperative.        Thought Content: Thought content normal. Thought content is not paranoid or delusional. Thought content does not include homicidal or suicidal ideation. Thought content does not include homicidal or suicidal plan.        Cognition and Memory: Cognition and memory normal.        Judgment: Judgment normal.     Comments: Insight intact     Lab Review:     Component Value Date/Time   NA 138 04/22/2022 0932   K 4.1 04/22/2022 0932   CL 98 04/22/2022 0932   CO2 31 04/22/2022 0932   GLUCOSE 102 (H) 04/22/2022 0932   BUN 10 04/22/2022 0932   CREATININE 0.71 04/22/2022 0932   CREATININE 0.68 01/26/2020 0920   CALCIUM 10.5 04/22/2022 0932   PROT 8.3 04/22/2022 0932   ALBUMIN 5.3 (H) 04/22/2022 0932   AST 82 (H) 04/22/2022 0932   ALT 118 (H) 04/22/2022 0932   ALKPHOS 74 04/22/2022 0932   BILITOT 0.3 04/22/2022 0932   GFRNONAA >90 05/01/2014 1645   GFRAA >90 05/01/2014 1645       Component Value Date/Time   WBC 3.3 (L) 04/22/2022 0932   RBC 4.25 04/22/2022 0932   HGB 14.3 04/22/2022 0932   HGB 14.1 10/26/2014 1034   HCT 40.7 04/22/2022 0932   PLT 245.0 04/22/2022 0932   MCV 95.7 04/22/2022 0932   MCH 34.4 (H) 01/26/2020 0920   MCHC 35.1 04/22/2022 0932   RDW 13.2 04/22/2022 0932   LYMPHSABS 0.9 04/22/2022 0932   MONOABS 0.2 04/22/2022 0932   EOSABS 0.1 04/22/2022 0932   BASOSABS 0.0  04/22/2022 0932    No results found for: "POCLITH", "LITHIUM"   No results found for:  "PHENYTOIN", "PHENOBARB", "VALPROATE", "CBMZ"   .res Assessment: Plan:   Pt seen for 35 minutes and time spent discussing treatment options for depression. Encouraged pt to discuss her questions and concerns about limited response to ECT treatments with ECT providers tomorrow. Pt asks about other treatment options for depression. Discussed potential benefits, risks, and side effects of Spravato. She reports that she is interested in Spravato treatments if she does not respond to ECT. Notified office staff of pt's interest in Spravato.  Will discontinue Caplyta since benefit is unclear.  Discussed potential benefits, risks, and side effects of Fetzima since pharmacogenetic testing indicated that there were no genetic contraindications to [facility]. Pt agrees to trial of Fetzima. Will start Fetzima 20 mg daily for 2 days, then increase to 40 mg daily for depression. Advised pt to contact office if she experiences increase in BP.  Continue Dayvigo 10 mg po QHS for insomnia.  Continue Klonopin 0.5 mg 1-1.5 tabs at bedtime and 1/2-1 tab as needed for anxiety.  Continue Concerta 26 mg daily for ADHD.  Continue Trazodone 100 mg 1-2 tabs po QHS prn insomnia.  Will discuss starting therapy with Rinaldo Cloud, LCSW.  Pt to follow-up with this provider in 3 weeks or sooner if clinically indicated.  Patient advised to contact office with any questions, adverse effects, or acute worsening in signs and symptoms.  Kesley was seen today for depression and anxiety.  Diagnoses and all orders for this visit:  Recurrent major depression resistant to treatment ([place]) -     Levomilnacipran HCl ER (FETZIMA TITRATION) 20 & 40 MG C4PK; Use as directed  Anxiety -     Levomilnacipran HCl ER (FETZIMA TITRATION) 20 & 40 MG C4PK; Use as directed  Insomnia, unspecified type  Attention deficit hyperactivity disorder (ADHD), predominantly inattentive type     Please see After Visit Summary for patient specific  instructions.  Future Appointments  Date Time Provider Ber[place]  05/15/2022  1:35 PM Megan Salon, MD [facility]  08/18/2022  2:35 PM Megan Salon, MD [facility]    No orders of the defined types were placed in this encounter.     -------------------------------

## 2022-05-01 ENCOUNTER — Telehealth: Payer: Self-pay | Admitting: Psychiatry

## 2022-05-01 NOTE — Telephone Encounter (Signed)
Told patient to call [facility] and to ask if they could tell her if another location had in stock if that one didn't.  Also let her know that there were several others in front of her for Spravato so that there was no timeframe for starting it currently.

## 2022-05-01 NOTE — Telephone Encounter (Signed)
Pt called reporting Levomilnacipran HCI 20 & 40 mg is on back order at [facility] in [place]. Advise if you know of pharmacy with in stock. Also, mentioned status on Spravato treatment and therapy apt. Contact pt @ [phone]

## 2022-05-02 ENCOUNTER — Telehealth: Payer: Self-pay | Admitting: Psychiatry

## 2022-05-02 DIAGNOSIS — F339 Major depressive disorder, recurrent, unspecified: Secondary | ICD-10-CM

## 2022-05-02 MED ORDER — SAVELLA 50 MG PO TABS: 50.0000 mg | ORAL_TABLET | Freq: Two times a day (BID) | ORAL | 0 refills | Status: DC

## 2022-05-02 MED ORDER — SAVELLA 12.5 MG PO TABS: ORAL_TABLET | ORAL | 0 refills | Status: DC

## 2022-05-02 NOTE — Telephone Encounter (Signed)
PT called back 2:00 and that [facility] no  longer has the [place] in stock. She is going to call some other places, but may need an alternative med for now. She feels like she needs to be on something.

## 2022-05-02 NOTE — Telephone Encounter (Signed)
Pt called back asking if she can start on a different med in the wait for new med to be available. Also, ask again about spravato. Explained there is a process and others in line before her.

## 2022-05-02 NOTE — Telephone Encounter (Signed)
Patient has called multiple times today. I saw that you sent 2 Rx to [facility]. I notified patient of this.

## 2022-05-02 NOTE — Telephone Encounter (Signed)
Pt called back again and said that no pharmacy  has it in stock. Can you send in something different

## 2022-05-02 NOTE — Telephone Encounter (Signed)
Contacted pt's pharmacy about availability of Milnacipran [facility]) as an alternative. Script sent to pt's pharmacy. Attempted to call pt to discuss. No answer. MyChart message sent to patient with information re: [place].

## 2022-05-02 NOTE — Telephone Encounter (Signed)
Pt called at 11:45a.  She found Fetzima '20mg'$  at [facility] on [address].  The titration packs out out stock nationwide.  She wants to know if [name] will send in the script for what is available.  Next appt 2/6

## 2022-05-06 ENCOUNTER — Telehealth: Payer: 59 | Admitting: Psychiatry

## 2022-05-07 MED ORDER — ESCITALOPRAM OXALATE 10 MG PO TABS: 10.0000 mg | ORAL_TABLET | Freq: Every day | ORAL | 0 refills | Status: DC

## 2022-05-07 NOTE — Addendum Note (Signed)
Addended by: Sharyl Nimrod on: 05/07/2022 02:46 PM   Modules accepted: Orders

## 2022-05-07 NOTE — Addendum Note (Signed)
Addended by: Sharyl Nimrod on: 05/07/2022 12:49 PM   Modules accepted: Orders

## 2022-05-08 ENCOUNTER — Ambulatory Visit (INDEPENDENT_AMBULATORY_CARE_PROVIDER_SITE_OTHER): Payer: 59 | Admitting: Behavioral Health

## 2022-05-08 DIAGNOSIS — F339 Major depressive disorder, recurrent, unspecified: Secondary | ICD-10-CM | POA: Diagnosis not present

## 2022-05-08 DIAGNOSIS — F900 Attention-deficit hyperactivity disorder, predominantly inattentive type: Secondary | ICD-10-CM | POA: Diagnosis not present

## 2022-05-08 DIAGNOSIS — F419 Anxiety disorder, unspecified: Secondary | ICD-10-CM

## 2022-05-08 NOTE — Progress Notes (Signed)
[facility] Initial Adult Exam  Name: Heather Davila Date: 05/08/2022 MRN: [id number] DOB: Feb 28, 1961 PCP: Eulas Post, MD  Time spent: 60 minutes    Guardian/Payee:  Johnnye Sima requested:  No   Reason for Visit /Presenting Problem: The patient presents as a 62 year old Caucasian married female referred for therapy by her current [facility] prescriber. She reports she is compliant with her medication regimen. The patient reports having a recent adjustment with her prescribed medication regimen. The patient states plans to soon attend ECT for depression. The patient states "I just fail into a deep depression". The patient presents with her husband in session whom reports belief that the ECT has been helpful for the patient. However, the patient presents with a reserve belief regarding the effectiveness. The patients husband reports the patient presents for therapy due to "Worrying about not getting better". The patient states belief to have side effects impairing her memory regarding ECT. The patients states she has had 19 ECT treatments as of today. The patient's husband reports she has delt with depression over a decade and has received numerous medication trials that sometimes appeared to help and then it wasn't helpful after a while.   The patient states she was born and raised in [place]. She reports she was raised by her biological parents whom are deceased. She reports to have two sister and no brothers. She describes her relationship with her sisters as "Good". She reports she has been married 34 years to her spouse. She states she has been married once. The patient states to have three sons and no daughters. She describes her relationship with her children as "Good". She has one grandchild age 70 years old whom she describes her relationship with as "Its good". 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 She states the highest education she has achieved is a Conservator, museum/gallery. She reports to have a stable residence and denied being at risk of eviction. The patient reports to reside with her husband. The patient reports interest with going back to church, getting back in her bible study, going out with friends, cooking, and cleaning.    Mental Status Exam:    Appearance:   Casual     Behavior:  Appropriate and Sharing  Motor:  Normal  Speech/Language:   Clear and Coherent  Affect:  Flat  Mood:  depressed and dysthymic  Thought process:  normal  Thought content:    WNL  Sensory/Perceptual disturbances:    WNL  Orientation:  oriented to person  Attention:  Good  Concentration:  Good  Memory:  WNL  Fund of knowledge:   Good  Insight:    Good  Judgment:   Good  Impulse Control:  Good   Reported Symptoms:  Increasing forgetfulness, low energy, hopelessness, trouble concentrating, problems with appetite, worrying all the time, trouble leaving home due to not wanting to, reports to have thoughts of dying sometimes without a plan, means, or intent.   Risk Assessment: Danger to Self:  No Self-injurious Behavior: No Danger to Others: No Duty to Warn:no Physical Aggression / Violence:No  Access to Firearms a concern: No  The patient's husband reports to have a locked safe for his fire arm for protection.  Gang Involvement:No  Patient / guardian was educated about steps to take if suicide or homicide risk level increases between visits: yes While future psychiatric events cannot  be accurately predicted, the patient does not currently require acute inpatient psychiatric care and does not currently meet [facility] involuntary commitment criteria.  In the event of an emergency the patient was encouraged to dial 911, call [facility], utilize a local emergency room, or call [facility] after hours line.   Substance Abuse History: Current  substance abuse: No   The patient denied current and past abuse of alchol and illicit substances.   Past Psychiatric History:   Previous psychological history is significant for ADHD, anxiety, and depression Outpatient Providers: [facility] for E[facility], and [facility].   History of Psych Hospitalization: No  Psychological Testing:   Abuse History: Victim of No.,  Report needed: Denied  Victim of Neglect:Denied  Perpetrator of  Denied  Witness / Exposure to Domestic Violence: Yes   Protective Services Involvement: No  Witness to [employer] Violence:  No   Family History:  Family History  Problem Relation Age of Onset   Cancer Mother 38       colon cancer   Hypertension Mother    Colon cancer Mother 60       colon rescection   Colon polyps Mother    Hypertension Father    Heart disease Father 70       CABG62   Stroke Father 62   Heart disease Sister        atrial fibrillation   Heart attack Sister 20       no stents   Depression Sister    Heart failure Paternal Aunt    Diabetes Paternal Uncle    Heart disease Paternal Grandfather    Esophageal cancer Neg Hx    Rectal cancer Neg Hx    Stomach cancer Neg Hx     Living situation: The patient lives with their spouse  Sexual Orientation:  Straight  Relationship Status: married    Support Systems; Spouse  Financial Stress:  No   Income/Employment/Disability: No income  Armed forces logistics/support/administrative officer: No   Educational History: Education: Scientist, product/process development:   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   Any cultural differences that may affect / interfere with treatment:  Not applicable   Recreation/Hobbies: Reading, walking, clean house and cook.   Stressors: The patient reports "My depression". The patient's husband reports "Fear of her life not getting better".   Strengths:  Supportive Relationships  Barriers:  Denied   Legal History: Pending legal issue / charges: The  patient has no significant history of legal issues. 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   TRANSAMINASES, SERUM, ELEVATED 08/14/2009    Past Surgical History:  Procedure Laterality Date   AUGMENTATION MAMMAPLASTY Bilateral    BREAST ENHANCEMENT SURGERY Bilateral    BUNIONECTOMY     CESAREAN SECTION     X 3   FACELIFT  2022   HYSTEROSCOPY  12/27/2007   and HTA   UPPER GASTROINTESTINAL ENDOSCOPY  2020    Medications: Current Outpatient Medications  Medication Sig Dispense Refill   clonazePAM (KLONOPIN) 0.5 MG tablet Take 1-1.5 tabs at bedtime and 1/2-1 tab as needed for anxiety. 60 tablet 1  doxycycline (VIBRAMYCIN) 50 MG capsule Take 50 mg by mouth daily.     escitalopram (LEXAPRO) 10 MG tablet Take 1 tablet (10 mg total) by mouth daily. 30 tablet 0   estradiol (ESTRACE) 0.1 MG/GM vaginal cream USE 1 GM VAGINALLY TWICE A WEEK AT BEDTIME 42.5 g 0   Lemborexant (DAYVIGO) 10 MG TABS Take 1/2-1 tab po QHS 30 tablet 1   MAGNESIUM PO Take by mouth.     methylphenidate 36 MG PO CR tablet Take 1 tablet (36 mg total) by mouth 2 (two) times daily with breakfast and lunch. (Patient taking differently: Take 36 mg by mouth daily.) 60 tablet 0   Multiple Vitamin (MULTIVITAMIN) tablet Take 1 tablet by mouth daily.     traZODone (DESYREL) 100 MG tablet Take 1 tablet (100 mg total) by mouth at bedtime. Take 1-2 tabs po QHS prn 180 tablet 1   valACYclovir (VALTREX) 500 MG tablet Take one twice daily for 3 days with any symptoms 30 tablet 3   No current facility-administered medications for this visit.    Allergies  Allergen Reactions   Ciprofloxacin     REACTION: hives    Oxycodone-Acetaminophen     REACTION: hives    Diagnoses:    ICD-10-CM   1. Recurrent major depression resistant to treatment ([place])  F33.9     2. Anxiety  F41.9     3. Attention deficit hyperactivity disorder (ADHD), predominantly inattentive type  F90.0       Plan of Care:   1) Long Term Goal: To alleviate depressive symptoms and return to previous level of effective functioning.      Short Term Goal: Increase ability to manage moods.     Objective: Be free of suicidal thoughts; call the crisis hotline if having suicidal thoughts.     Objective: Make short and simple to do list and complete three task each day.     Objective: Get thru the week without a crying spell.   2) Long Term Goal: Stabilize anxiety level while increasing ability to function on a daily basis.      Short Term Goal: Develop strategies to reduce symptoms.     Objective: Develop strategies for thought distraction when fixating on the future.      Objective: Learn two new ways of coping with routine stressors.     Jannifer Hick, [facility]

## 2022-05-09 ENCOUNTER — Telehealth: Payer: 59 | Admitting: Psychiatry

## 2022-05-14 ENCOUNTER — Other Ambulatory Visit: Payer: Self-pay

## 2022-05-14 ENCOUNTER — Ambulatory Visit (INDEPENDENT_AMBULATORY_CARE_PROVIDER_SITE_OTHER): Payer: 59 | Admitting: Psychiatry

## 2022-05-14 ENCOUNTER — Encounter: Payer: Self-pay | Admitting: Behavioral Health

## 2022-05-14 ENCOUNTER — Encounter: Payer: Self-pay | Admitting: Psychiatry

## 2022-05-14 ENCOUNTER — Ambulatory Visit: Payer: 59 | Admitting: Behavioral Health

## 2022-05-14 ENCOUNTER — Ambulatory Visit (INDEPENDENT_AMBULATORY_CARE_PROVIDER_SITE_OTHER): Payer: 59 | Admitting: Behavioral Health

## 2022-05-14 VITALS — BP 125/54 | HR 73 | Wt 89.0 lb

## 2022-05-14 DIAGNOSIS — F419 Anxiety disorder, unspecified: Secondary | ICD-10-CM | POA: Diagnosis not present

## 2022-05-14 DIAGNOSIS — G4700 Insomnia, unspecified: Secondary | ICD-10-CM

## 2022-05-14 DIAGNOSIS — F411 Generalized anxiety disorder: Secondary | ICD-10-CM | POA: Diagnosis not present

## 2022-05-14 DIAGNOSIS — F332 Major depressive disorder, recurrent severe without psychotic features: Secondary | ICD-10-CM | POA: Diagnosis not present

## 2022-05-14 DIAGNOSIS — F339 Major depressive disorder, recurrent, unspecified: Secondary | ICD-10-CM

## 2022-05-14 MED ORDER — ESCITALOPRAM OXALATE 20 MG PO TABS: 20.0000 mg | ORAL_TABLET | Freq: Every day | ORAL | 2 refills | Status: DC

## 2022-05-14 MED ORDER — DAYVIGO 10 MG PO TABS: ORAL_TABLET | ORAL | 2 refills | Status: DC

## 2022-05-14 NOTE — Progress Notes (Signed)
Heather Davila [id number] 05/28/1960 62 y.o.  Subjective:   Patient ID:  Heather Davila is a 62 y.o. (DOB July 30, 1960) female.  Chief Complaint:  Chief Complaint  Patient presents with   Depression   Anxiety    HPI Heather Davila presents to the office today for follow-up of depression, anxiety, insomnia, and ADHD. She is accompanied by her husband. She reports that she is considering stopping ECT treatments since she has not seen a significant improvement and is concerned about memory loss. She has had over 20 ECT treatments and is interested in starting Spravato. Her husband reports that he has noticed some improvements in her depression with ECT. He reports that he has not seen any recent improvements after medication changes. He reports that she is crying less, answering the phone, and talking with their children by phone again.  Husband reports that she has "always been a Research officer, trade [employer]." He reports that she has had to obsessively clean.  Heather Davila reports that she has had increased worry in response to depression treatments not seeming to be effective. She reports that she continues to have persistent depression. She reports feeling sad "all the time and for no reason." Husband reports that she is not crying as much. Energy and motivation have been very low. She cooked last night for the first time in awhile. She has been talking some with family. She reports that she is sleeping well with medication and staying in bed for 10 hours. Husband report that her food intake is minimal. Husband reports that she will eat only small amounts and not sit down to eat.  She reports that her concentration is "not good." They report that she has had significant memory impairment since starting ECT. They report that she is not able to remember where she keeps items in her own kitchen. She reports looking forward to talking to her children. She reports excessive feelings of guilt and husband reports that she apologizes to  him daily for recent depression. Occasional passive death wishes. Denies SI.   She has started therapy with Basilia Jumbo, LCSW.   Klonopin last filled 04/22/22. Dayvigo last filled 04/09/22 x 2  Past Psychiatric Medication Trials: Trintellix - Initially effective and then was not effective when re-started Paxil- Ineffective. Helped initially. Prozac-Insomnia Lexapro- Effective and then no longer as effective Zoloft- Initially effective and then not as effective.  Viibryd Cymbalta- Ineffective. Helped initially. Effexor XR- Effective, well tolerated. Pristiq- Increased anxiety at 150 mg daily Wellbutrin XL-Ineffective.May have increased anxiety. Amitriptyline Nortriptyline-Caused irritability, constipation Auvelity- ineffective Abilify- Helpful but caused severe insomnia. Rexulti-Helpful but caused insomnia. Vraylar Seroquel - Ineffective Risperdal Olanzapine- Ineffective Latuda- Ineffective Caplyta Klonopin- Effective Temazepam- Took during menopause Lunesta- Ineffective Ambien-Ineffective Sonata- Ineffective Dayvigo- partially effective Trazodone- Ineffective Remeron-Ineffective. Does not recall wt gain.  Adderall- Took once and had adverse effect. Concerta- Effective Metadate-Not as effective as Concerta Dexmethylphenidate- Not as effective as Concerta Azstarys- some side effects. Not as effective.  Lamictal- May have had an adverse effects. "Felt weird." Reports worsening s/s.  Lithium Gabapentin Pramipexole- "felt really weird."   AIMS    Flowsheet Row Video Visit from 01/03/2022 in [place] Total Score 0      ECT-MADRS    Flowsheet Row Video Visit from 04/29/2022 in [place] Video Visit from 02/06/2022 in [place] Total Score 44 23      PHQ2-9    Flowsheet Row Video Visit from 02/06/2022 in [place] Visit from  11/16/2020 in [place] Visit from 04/19/2018 in  at [facility] Total Score 6 0 0  PHQ-9 Total Score 14 -- 2        Review of Systems:  Review of Systems  Gastrointestinal: Negative.   Musculoskeletal:  Negative for gait problem.  Neurological:  Negative for tremors and headaches.  Psychiatric/Behavioral:         Please refer to HPI    Medications: I have reviewed the patient's current medications.  Current Outpatient Medications  Medication Sig Dispense Refill   clonazePAM (KLONOPIN) 0.5 MG tablet Take 1-1.5 tabs at bedtime and 1/2-1 tab as needed for anxiety. 60 tablet 1   doxycycline (VIBRAMYCIN) 50 MG capsule Take 50 mg by mouth daily.     estradiol (ESTRACE) 0.1 MG/GM vaginal cream USE 1 GM VAGINALLY TWICE A WEEK AT BEDTIME 42.5 g 0   MAGNESIUM PO Take by mouth.     methylphenidate 36 MG PO CR tablet Take 1 tablet (36 mg total) by mouth 2 (two) times daily with breakfast and lunch. (Patient taking differently: Take 36 mg by mouth daily.) 60 tablet 0   Multiple Vitamin (MULTIVITAMIN) tablet Take 1 tablet by mouth daily.     traZODone (DESYREL) 100 MG tablet Take 1 tablet (100 mg total) by mouth at bedtime. Take 1-2 tabs po QHS prn 180 tablet 1   escitalopram (LEXAPRO) 20 MG tablet Take 1 tablet (20 mg total) by mouth daily. 30 tablet 2   Lemborexant (DAYVIGO) 10 MG TABS Take 1/2-1 tab po QHS 30 tablet 2   valACYclovir (VALTREX) 500 MG tablet Take one twice daily for 3 days with any symptoms (Patient not taking: Reported on 05/14/2022) 30 tablet 3   No current facility-administered medications for this visit.    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   TRANSAMINASES, SERUM, ELEVATED 08/14/2009    Past Medical History, Surgical history, Social history, and Family history were reviewed and updated as appropriate.   Please see review of systems for further details on the patient's review from today.   Objective:   Physical Exam:  BP (!) 125/54   Pulse 73   Wt 89 lb (40.4 kg)   LMP 12/27/2007 (Exact Date)   BMI 17.47 kg/m   Physical Exam Constitutional:      General: She is not in acute distress. Musculoskeletal:        General: No deformity.  Neurological:     Mental Status: She is alert and oriented to person, place, and time.     Coordination: Coordination normal.  Psychiatric:        Attention and Perception: Attention and perception normal. She does not perceive auditory or visual hallucinations.        Mood and Affect: Mood is anxious and depressed. Affect is not labile, blunt, angry or inappropriate.        Speech: Speech normal.        Behavior: Behavior normal.  Thought Content: Thought content normal. Thought content is not paranoid or delusional. Thought content does not include homicidal or suicidal ideation. Thought content does not include homicidal or suicidal plan.        Cognition and Memory: Cognition normal. Memory is impaired.        Judgment: Judgment normal.     Comments: Insight intact     Lab Review:     Component Value Date/Time   NA 138 04/22/2022 0932   K 4.1 04/22/2022 0932   CL 98 04/22/2022 0932   CO2 31 04/22/2022 0932   GLUCOSE 102 (H) 04/22/2022 0932   BUN 10 04/22/2022 0932   CREATININE 0.71 04/22/2022 0932   CREATININE 0.68 01/26/2020 0920   CALCIUM 10.5 04/22/2022 0932   PROT 8.3 04/22/2022 0932   ALBUMIN 5.3 (H) 04/22/2022 0932   AST 82 (H) 04/22/2022 0932   ALT 118 (H) 04/22/2022 0932   ALKPHOS 74 04/22/2022 0932   BILITOT 0.3 04/22/2022 0932   GFRNONAA >90 05/01/2014 1645   GFRAA >90 05/01/2014  1645       Component Value Date/Time   WBC 3.3 (L) 04/22/2022 0932   RBC 4.25 04/22/2022 0932   HGB 14.3 04/22/2022 0932   HGB 14.1 10/26/2014 1034   HCT 40.7 04/22/2022 0932   PLT 245.0 04/22/2022 0932   MCV 95.7 04/22/2022 0932   MCH 34.4 (H) 01/26/2020 0920   MCHC 35.1 04/22/2022 0932   RDW 13.2 04/22/2022 0932   LYMPHSABS 0.9 04/22/2022 0932   MONOABS 0.2 04/22/2022 0932   EOSABS 0.1 04/22/2022 0932   BASOSABS 0.0 04/22/2022 0932    No results found for: "POCLITH", "LITHIUM"   No results found for: "PHENYTOIN", "PHENOBARB", "VALPROATE", "CBMZ"   .res Assessment: Plan:    Pt seen for 45 minutes and time spent obtaining collateral information from pt's husband and discussing response to treatment. Discussed that more time is needed to see response from Lexapro since she has been taking it for one week. Discussed potential benefits, risks, and side effects of increasing Lexapro to 20 mg daily and pt agrees to dose increase. Discussed that she has tried most treatment options approved for depression and many medications that are used off-label for depression and has had limited improvement.  Discussed that she is currently on the waiting list for Spravato and provider is uncertain about wait time before starting treatment since it is dependent on insurance approval and when office is able to start a new patient on treatments due to space and staffing constraints. Informed pt that clinic nurse has offered to meet with her after her visit today and should be able to provide more information.  Recommended continuing ECT at least until she can start Spravato treatments since she may not be able to start Spravato immediately, her husband is noticing some slight improvements in depressive symptoms, and since some patients require multiple treatments before benefiting from ECT.  Continue Klonopin as needed for anxiety.  Continue Dayvigo 10 mg 1/2-1 tab po QHS for insomnia.  Continue  Concerta 36 mg po qd for ADHD.  Continue Trazodone 100 mg 1-2 tabs as needed for insomnia.  Recommend continuing psychotherapy.  Pt to follow-up with this provider in 3-4 weeks or sooner if clinically indicated.  Patient advised to contact office with any questions, adverse effects, or acute worsening in signs and symptoms.   Heather Davila was seen today for depression and anxiety.  Diagnoses and all orders for this visit:  Recurrent major depression resistant  to treatment ([place]) -     escitalopram (LEXAPRO) 20 MG tablet; Take 1 tablet (20 mg total) by mouth daily.  Generalized anxiety disorder  Insomnia, unspecified type -     Lemborexant (DAYVIGO) 10 MG TABS; Take 1/2-1 tab po QHS     Please see After Visit Summary for patient specific instructions.  Future Appointments  Date Time Provider [place]  05/21/2022 10:00 AM Jannifer Hick, [facility] CP-CP None  05/30/2022  9:00 AM Jannifer Hick, [facility] CP-CP None  06/03/2022 10:30 AM [name], PMHNP CP-CP None  08/18/2022  2:35 PM Megan Salon, MD [facility]    No orders of the defined types were placed in this encounter.   -------------------------------

## 2022-05-14 NOTE — Patient Instructions (Signed)
Past Psychiatric Medication Trials: Trintellix - Initially effective and then was not effective when re-started Paxil- Ineffective. Helped initially. Prozac-Insomnia Lexapro- Effective and then no longer as effective Zoloft- Initially effective and then not as effective.  Viibryd Cymbalta- Ineffective. Helped initially. Effexor XR- Effective, well tolerated. Pristiq- Increased anxiety at 150 mg daily Wellbutrin XL-Ineffective.May have increased anxiety. Amitriptyline Nortriptyline-Caused irritability, constipation Auvelity- ineffective Abilify- Helpful but caused severe insomnia. Rexulti-Helpful but caused insomnia. Vraylar Seroquel - Ineffective Risperdal Olanzapine- Ineffective Latuda- Ineffective Caplyta Klonopin- Effective Temazepam- Took during menopause Lunesta- Ineffective Ambien-Ineffective Sonata- Ineffective Dayvigo- partially effective Trazodone- Ineffective Remeron-Ineffective. Does not recall wt gain.  Adderall- Took once and had adverse effect. Concerta- Effective Metadate-Not as effective as Concerta Dexmethylphenidate- Not as effective as Concerta Azstarys- some side effects. Not as effective.  Lamictal- May have had an adverse effects. "Felt weird." Reports worsening s/s.  Lithium Gabapentin Pramipexole- "felt really weird."

## 2022-05-14 NOTE — Progress Notes (Unsigned)
[facility] Counselor/Therapist Progress Note  Patient ID: Heather Davila, MRN: [id number],    Date: 05/14/2022  Time Spent: 50 minutes   Treatment Type: Psychotherapy  Reported Symptoms: The patient reports doing "Alright, same depression and really I have no idea why. I guess cause the meds stopped working. I can't remember anything because of the ECT treatments. I don't feel like they are helping I want to stop them".   Mental Status Exam:  Appearance:   Casual     Behavior:  Appropriate  Motor:  Normal  Speech/Language:   Clear and Coherent  Affect:  Depressed and Flat  Mood:  depressed and dysthymic  Thought process:  normal  Thought content:    WNL  Sensory/Perceptual disturbances:    WNL  Orientation:  oriented to person  Attention:  Good  Concentration:  Good  Memory:  WNL  Fund of knowledge:   Good  Insight:    Good  Judgment:   Good  Impulse Control:  Good   Risk Assessment: Danger to Self:  No Self-injurious Behavior: No Danger to Others: No Duty to Warn:no Physical Aggression / Violence:No  Access to Firearms a concern: No  Gang Involvement:No   Subjective: The patient reports since last being seen by this therapist she has not experienced any crying spells and she denied experiencing suicidal thoughts. The patient reports interest with receiving treatment for Spravado. She reports having knowledge that "Ketamine can help with anti resistant depression and I just thought it would help". She reports she has received 23 ECT treatments and states "It causes you to not remember and that's discouraging because you can't remember anything. I feel like its not helping".   The patient denied experiencing ruminating thoughts on the past and future since being seen by this therapist. She identified a plan to implement three task to work towards her therapy goals as deep cleaning her house, cooking daily and walking the dog daily. She reports interest with meeting with  her friend one day during the week. In addition, the patient reports interest with wanting to incorporate daily prayer time. She reports in the past lack of motivation has prevented her from doing things she enjoy.  In addition, she reports having low energy and feeling tired has prevented her from doing things she enjoy in the past. The patient identified common unhelpful thoughts in the past as "Can't do anything well, nothing is going to change for the better, there must be something wrong with me". In session she changed unhelpful thoughts to "There are people who care about me, I am lovable". The patient identified common unhelpful thoughts in the past that creat anxiety as "I am going to make a mistake and it will be terrible, I worry a lot". In session, she reframed anxious thoughts as "Most of the time nothing really bad ever happens". The patient reports since dealing with depression "I have gotten away from [place] and its really bothering me". The patient identified her take away as "Set goals, try to think positive and focus on Jesus". The patient denied SI/HI, AH/VH.   The counselor questioned mental health symptoms experienced. The counselor discussed automatic thoughts and educated the patient on thoughts/feelings/behaviors are connected. The counselor requested the patient identify common unhelpful thoughts that create anxiety and depression. The counselor provided assistance to the patient with creating a plan and identifying daily task she would like to complete for the week. The counselor integrated spirituality in today's session and  read Philippians 4:8 to the patient due to her spiritual beliefs. The counselor encouraged the patient to challenge negative automatic thoughts and reframe her thoughts. The counselor discussed staying in the present moment to assist the patient with managing thoughts that make her anxious. The counselor assessed for SI/HI, AH/VH.     Interventions: Cognitive  Behavioral Therapy  Diagnosis:   ICD-10-CM   1. Severe episode of recurrent major depressive disorder, without psychotic features ([place])  F33.2     2. Anxiety  F41.9       Plan:   The patient reports interest with going back to church, getting back in her bible study, going out with friends, cooking, cleaning.    1) Long Term Goal: To alleviate depressive symptoms and return to previous level of effective functioning.      Short Term Goal: Increase ability to manage moods.     Objective: Be free of suicidal thoughts; call the crisis hotline if having suicidal thoughts.     Objective: Make short and simple to do list and complete three task each day.     Objective: Get thru the week without a crying spell.    2) Long Term Goal: Stabilize anxiety level while increasing ability to function on a daily basis.      Short Term Goal: Develop strategies to reduce symptoms.     Objective: Develop strategies for thought distraction when fixating on the future.      Objective: Learn two new ways of coping with routine stressors.     Jannifer Hick, [facility]

## 2022-05-15 ENCOUNTER — Ambulatory Visit (HOSPITAL_BASED_OUTPATIENT_CLINIC_OR_DEPARTMENT_OTHER): Payer: Managed Care, Other (non HMO) | Admitting: Obstetrics & Gynecology

## 2022-05-15 MED ORDER — SPRAVATO (56 MG DOSE) 28 MG/DEVICE NA SOPK: 56.0000 mg | PACK | NASAL | 1 refills | Status: DC

## 2022-05-21 ENCOUNTER — Ambulatory Visit: Payer: 59

## 2022-05-21 ENCOUNTER — Other Ambulatory Visit: Payer: Self-pay

## 2022-05-21 ENCOUNTER — Ambulatory Visit: Payer: 59 | Admitting: Behavioral Health

## 2022-05-21 ENCOUNTER — Ambulatory Visit (INDEPENDENT_AMBULATORY_CARE_PROVIDER_SITE_OTHER): Payer: 59 | Admitting: Psychiatry

## 2022-05-21 ENCOUNTER — Encounter: Payer: Self-pay | Admitting: Psychiatry

## 2022-05-21 ENCOUNTER — Ambulatory Visit (INDEPENDENT_AMBULATORY_CARE_PROVIDER_SITE_OTHER): Payer: 59 | Admitting: Behavioral Health

## 2022-05-21 VITALS — BP 133/76 | HR 74

## 2022-05-21 DIAGNOSIS — F900 Attention-deficit hyperactivity disorder, predominantly inattentive type: Secondary | ICD-10-CM

## 2022-05-21 DIAGNOSIS — F332 Major depressive disorder, recurrent severe without psychotic features: Secondary | ICD-10-CM | POA: Diagnosis not present

## 2022-05-21 DIAGNOSIS — G4700 Insomnia, unspecified: Secondary | ICD-10-CM

## 2022-05-21 DIAGNOSIS — F339 Major depressive disorder, recurrent, unspecified: Secondary | ICD-10-CM | POA: Diagnosis not present

## 2022-05-21 DIAGNOSIS — F411 Generalized anxiety disorder: Secondary | ICD-10-CM

## 2022-05-21 MED ORDER — SPRAVATO (84 MG DOSE) 28 MG/DEVICE NA SOPK: 84.0000 mg | PACK | NASAL | 5 refills | Status: DC

## 2022-05-21 NOTE — Progress Notes (Signed)
[name] [id number] 1961-02-15 62 y.o.  Subjective:   Patient ID:  Heather Davila is a 62 y.o. (DOB 1960/10/28) female.  Chief Complaint:  Chief Complaint  Patient presents with   Follow-up   Depression   Anxiety    HPI [name] presents to the office today for follow-up of treatment resistant recurrent depression, generalized anxiety disorder and insomnia.  Almost too numerous to count med failures. She is here for Spravato administration for her treatment resistant depression.  05/21/22 appt noted: Patient was administered first dose Spravato 56 mg intranasally today.  The patient experienced the typical dissociation which gradually resolved over the 2-hour period of observation.  There were no complications.  Specifically the patient did not have nausea or vomiting or headache.   Dissociation was mild. Did not experience any emotional relief from disabling depression.wants to increase Spravato to the usual dose.  She is aware insurance is not covering the costs at this time.  No SI acutely. Tolerating meds.     Past Psychiatric Medication Trials: Trintellix - Initially effective and then was not effective when re-started Paxil- Ineffective. Helped initially. Prozac-Insomnia Lexapro- Effective and then no longer as effective Zoloft- Initially effective and then not as effective.  Viibryd Cymbalta- Ineffective. Helped initially. Effexor XR- Effective, well tolerated. Pristiq- Increased anxiety at 150 mg daily Wellbutrin XL-Ineffective.May have increased anxiety. Amitriptyline Nortriptyline-Caused irritability, constipation Auvelity- ineffective Abilify- Helpful but caused severe insomnia. Rexulti-Helpful but caused insomnia. Vraylar Seroquel - Ineffective Risperdal Olanzapine- Ineffective Latuda- Ineffective Caplyta Klonopin- Effective Temazepam- Took during menopause Lunesta- Ineffective Ambien-Ineffective Sonata- Ineffective Dayvigo- partially  effective Trazodone- Ineffective Remeron-Ineffective. Does not recall wt gain.  Adderall- Took once and had adverse effect. Concerta- Effective Metadate-Not as effective as Concerta Dexmethylphenidate- Not as effective as Concerta Azstarys- some side effects. Not as effective.  Lamictal- May have had an adverse effects. "Felt weird." Reports worsening s/s.  Lithium Gabapentin Pramipexole- "felt really weird."   ECT #20 with minimal response  AIMS    Flowsheet Row Video Visit from 01/03/2022 in [place] Total Score 0      ECT-MADRS    Flowsheet Row Video Visit from 04/29/2022 in [place] Video Visit from 02/06/2022 in [facility]  MADRS Total Score 44 23      PHQ2-9    Flowsheet Row Video Visit from 02/06/2022 in [place] Office Visit from 11/16/2020 in [facility] at [place] Visit from 04/19/2018 in [place] [facility] Total Score 6 0 0  PHQ-9 Total Score 14 -- 2        Review of Systems:  Review of Systems  Constitutional:  Positive for fatigue.  Psychiatric/Behavioral:  Positive for decreased concentration and dysphoric mood.     Medications: I have reviewed the patient's current medications.  Current Outpatient Medications  Medication Sig Dispense Refill   clonazePAM (KLONOPIN) 0.5 MG tablet Take 1-1.5 tabs at bedtime and 1/2-1 tab as needed for anxiety. 60 tablet 1   doxycycline (VIBRAMYCIN) 50 MG capsule Take 50 mg by mouth daily.     escitalopram (LEXAPRO) 20 MG tablet Take 1 tablet (20 mg total) by mouth daily. 30 tablet 2   Esketamine HCl, 56 MG Dose, (SPRAVATO, 56 MG DOSE,) 28 MG/DEVICE SOPK Place 56 mg into the nose every 3 (three) days. 2 each 1   estradiol (ESTRACE) 0.1 MG/GM vaginal cream USE 1  GM VAGINALLY TWICE A WEEK AT BEDTIME 42.5 g 0    Lemborexant (DAYVIGO) 10 MG TABS Take 1/2-1 tab po QHS 30 tablet 2   MAGNESIUM PO Take by mouth.     methylphenidate 36 MG PO CR tablet Take 1 tablet (36 mg total) by mouth 2 (two) times daily with breakfast and lunch. (Patient taking differently: Take 36 mg by mouth daily.) 60 tablet 0   Multiple Vitamin (MULTIVITAMIN) tablet Take 1 tablet by mouth daily.     traZODone (DESYREL) 100 MG tablet Take 1 tablet (100 mg total) by mouth at bedtime. Take 1-2 tabs po QHS prn 180 tablet 1   valACYclovir (VALTREX) 500 MG tablet Take one twice daily for 3 days with any symptoms 30 tablet 3   Esketamine HCl, 84 MG Dose, (SPRAVATO, 84 MG DOSE,) 28 MG/DEVICE SOPK Place 84 mg into the nose every 3 (three) days. 3 each 5   No current facility-administered medications for this visit.    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   TRANSAMINASES, SERUM, ELEVATED 08/14/2009    Past Medical History, Surgical history, Social history, and Family history were reviewed and updated as appropriate.   Please see review of systems for further details on the patient's review from today.   Objective:   Physical Exam:  LMP 12/27/2007 (Exact Date)   Physical Exam Constitutional:      General: She is not in acute distress. Musculoskeletal:        General: No deformity.  Neurological:     Mental Status: She is alert and oriented to person, place, and time.     Coordination: Coordination normal.  Psychiatric:        Attention and Perception: Attention and perception normal. She does not perceive auditory or visual hallucinations.         Mood and Affect: Mood is depressed. Mood is not anxious. Affect is blunt. Affect is not labile, angry or inappropriate.        Speech: Speech normal.        Behavior: Behavior normal.        Thought Content: Thought content normal. Thought content is not paranoid or delusional. Thought content does not include homicidal or suicidal ideation. Thought content does not include suicidal plan.        Cognition and Memory: Cognition and memory normal.        Judgment: Judgment normal.     Comments: Insight intact     Lab Review:     Component Value Date/Time   NA 138 04/22/2022 0932   K 4.1 04/22/2022 0932   CL 98 04/22/2022 0932   CO2 31 04/22/2022 0932   GLUCOSE 102 (H) 04/22/2022 0932   BUN 10 04/22/2022 0932   CREATININE 0.71 04/22/2022 0932   CREATININE 0.68 01/26/2020 0920   CALCIUM 10.5 04/22/2022 0932   PROT 8.3 04/22/2022 0932   ALBUMIN 5.3 (H) 04/22/2022 0932   AST 82 (H) 04/22/2022 0932   ALT 118 (H) 04/22/2022 0932   ALKPHOS  74 04/22/2022 0932   BILITOT 0.3 04/22/2022 0932   GFRNONAA >90 05/01/2014 1645   GFRAA >90 05/01/2014 1645       Component Value Date/Time   WBC 3.3 (L) 04/22/2022 0932   RBC 4.25 04/22/2022 0932   HGB 14.3 04/22/2022 0932   HGB 14.1 10/26/2014 1034   HCT 40.7 04/22/2022 0932   PLT 245.0 04/22/2022 0932   MCV 95.7 04/22/2022 0932   MCH 34.4 (H) 01/26/2020 0920   MCHC 35.1 04/22/2022 0932   RDW 13.2 04/22/2022 0932   LYMPHSABS 0.9 04/22/2022 0932   MONOABS 0.2 04/22/2022 0932   EOSABS 0.1 04/22/2022 0932   BASOSABS 0.0 04/22/2022 0932    No results found for: "POCLITH", "LITHIUM"   No results found for: "PHENYTOIN", "PHENOBARB", "VALPROATE", "CBMZ"   .res Assessment: Plan:    Monisha was seen today for follow-up, depression and anxiety.  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  Patient was administered first dose Spravato 56 mg intranasally today.  The patient experienced the typical dissociation which gradually resolved over the 2-hour period of observation.  There were no complications.  Specifically the patient did not have nausea or vomiting or headache.  Blood pressures remained within normal ranges at the 40-minute and 2-hour follow-up intervals.  By the time the 2-hour observation period was met the patient was alert and oriented and able to exit without assistance.  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  See nursing note for further details.  Regarding med options.  She has obviously been thoroughly tried on numerous medications.  The only antidepressant category that she has not taken is MAO inhibitors which are not compatible with Spravato.  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 Disc this with her.  She will consider.  She wants to increase Spravato to '84mg'$  in hopes of improvement as soon as possible.  Tolerating meds. No med changes.  FU twice weekly Spravato for 4 weeks and reevaluate.  Lynder Parents, MD, DFAPA    Please see After Visit Summary for patient specific instructions.  Future Appointments  Date Time Provider [place]  05/23/2022  9:00 AM CP-NURSE [facility] None  05/23/2022  9:00 AM Mozingo, Berdie Ogren, NP CP-CP None  05/30/2022  9:00 AM Jannifer Hick, [facility] CP-CP None  06/03/2022 10:30 AM [name], PMHNP CP-CP None  08/18/2022  2:35 PM Megan Salon, MD [facility]    No orders of the defined types were placed in this encounter.   ------------------------------- T

## 2022-05-21 NOTE — Progress Notes (Unsigned)
[facility] Counselor/Therapist Progress Note  Patient ID: Heather Davila, MRN: [id number],    Date: 05/21/2022  Time Spent: 50 minutes   Treatment Type: Psychotherapy  Reported Symptoms: Depression   Mental Status Exam:  Appearance:   Casual     Behavior:  Appropriate and Sharing  Motor:  Normal  Speech/Language:   Clear and Coherent  Affect:  Depressed and Flat  Mood:  sad  Thought process:  normal  Thought content:    WNL  Sensory/Perceptual disturbances:    WNL  Orientation:  oriented to person  Attention:  Good  Concentration:  Good  Memory:  WNL  Fund of knowledge:   Good  Insight:    Good  Judgment:   Good  Impulse Control:  Good   Risk Assessment: Danger to Self:  No Self-injurious Behavior: No Danger to Others: No Duty to Warn:no Physical Aggression / Violence:No  Access to Firearms a concern: No  Gang Involvement:No   Subjective: The patient discussed her past history in session that she states belief has contributed to her depression. The patient reports her mother died in 2022/01/02 and reports after her mother died her son whom was addicted to illicit substance moved into her home to help him discontinue his opioid addiction. She reports these events were "Disturbing" she reports her son is a jockey and expressed gratitude with him overcoming his drug addiction. The patient reports having guilty feelings regarding her mother and the patient states to struggle with self blame. She reports having guilty feelings regarding her mother due to "She just made people feel that way she was never around much growing up so I was on my own a lot". She reports having thoughts that she owed her mother something and was supposed to take care of her. She identified self defeating beliefs as "Submissiveness and achievement due to my worthiness depends on my achievements and people will not love me as a flawed human being". In addition she reports "I should always try to please  others even if I make myself miserable in the process. The problems in my relationships are bound to be my fought". She reports her husband excepts her with flaws she states to have self defeating beliefs regarding her deceased mother.   She states to identify with cognitive distortions of emotional reasoning due to "I feel like an idiot a lot of times" and mental filtering by "dwell on negatives and ignore positives of myself". She reports "being that little girl if I were an older person I would tell me it wasn't my responsibility it wasn't my fought. Me being older I feel like it is my fought and my responsibility". She reports additional feelings as sadness, depression, death of a loved one, loss of a job, failure to achieve an important personal goal, guilt or shame to believe she has hurt her mother, anxiety, worry, fear, inferioty or inadequacy, loneliness, and hopelessness. She identified thinking errors as ignoring the good, blowing things up, fortune telling, negative labeling, feelings as facts, and self blaming. The patient identified her take away from today's session as "It made me realize things I am thinking I didn't know I was thinking and how it connects with how I feel and just to be more of aware of how I think".  The patient reports her ECT treatments have been discontinued and states she will start her first [facility] treatment today the patient states "I am looking forward to it". She denied SI/HI, AH/VH.  Counselor discussed the patients mental health. The counselor utilized reflective listening and validated the patient statements. The counselor educated the patient on thoughts/feelings/behaviors are connected. The counselor discussed irrational thinking and challenging cognitive distortions. The counselor requested feedback from the patient on what thinking errors she identified with. The counselor educated the patient on coping strategies to utilize to assist her with challenging  irrational thoughts. The counselor assessed for SI/HI, AH/VH.  Interventions: Cognitive Behavioral Therapy  Diagnosis:   ICD-10-CM   1. Severe episode of recurrent major depressive disorder, without psychotic features ([place])  F33.2     2. Generalized anxiety disorder  F41.1       Plan:   1) Long Term Goal: To alleviate depressive symptoms and return to previous level of effective functioning.      Short Term Goal: Increase ability to manage moods.     Objective: Be free of suicidal thoughts; call the crisis hotline if having suicidal thoughts.     Objective: Make short and simple to do list and complete three task each day.     Objective: Get thru the week without a crying spell.    2) Long Term Goal: Stabilize anxiety level while increasing ability to function on a daily basis.      Short Term Goal: Develop strategies to reduce symptoms.     Objective: Develop strategies for thought distraction when fixating on the future.      Objective: Learn two new ways of coping with routine stressors.    Jannifer Hick, [facility]

## 2022-05-22 ENCOUNTER — Encounter: Payer: Self-pay | Admitting: Behavioral Health

## 2022-05-23 ENCOUNTER — Ambulatory Visit: Payer: 59

## 2022-05-23 ENCOUNTER — Encounter: Payer: Self-pay | Admitting: Psychiatry

## 2022-05-23 ENCOUNTER — Encounter: Payer: 59 | Admitting: Adult Health

## 2022-05-23 ENCOUNTER — Ambulatory Visit (INDEPENDENT_AMBULATORY_CARE_PROVIDER_SITE_OTHER): Payer: 59 | Admitting: Psychiatry

## 2022-05-23 VITALS — BP 127/76 | HR 71

## 2022-05-23 DIAGNOSIS — F411 Generalized anxiety disorder: Secondary | ICD-10-CM | POA: Diagnosis not present

## 2022-05-23 DIAGNOSIS — F339 Major depressive disorder, recurrent, unspecified: Secondary | ICD-10-CM | POA: Diagnosis not present

## 2022-05-23 DIAGNOSIS — G4700 Insomnia, unspecified: Secondary | ICD-10-CM | POA: Diagnosis not present

## 2022-05-23 DIAGNOSIS — F900 Attention-deficit hyperactivity disorder, predominantly inattentive type: Secondary | ICD-10-CM

## 2022-05-23 NOTE — Progress Notes (Signed)
Heather Davila [id number] [birth year] 62 y.o.  Subjective:   Patient ID:  Heather Davila is a 62 y.o. (DOB 04-28-1961) female.  Chief Complaint:  Chief Complaint  Patient presents with   Follow-up   Depression    HPI Heather Davila presents to the office today for follow-up of treatment resistant recurrent depression, generalized anxiety disorder and insomnia.  Almost too numerous to count med failures. She is here for Spravato administration for her treatment resistant depression.  05/21/22 appt noted: Patient was administered first dose Spravato 56 mg intranasally today.  The patient experienced the typical dissociation which gradually resolved over the 2-hour period of observation.  There were no complications.  Specifically the patient did not have nausea or vomiting or headache.   Dissociation was mild. Did not experience any emotional relief from disabling depression.wants to increase Spravato to the usual dose.  She is aware insurance is not covering the costs at this time.  No SI acutely. Tolerating meds.    05/23/22 appt noted: Patient was administered first dose Spravato 84 mg intranasally today.  The patient experienced the typical dissociation which gradually resolved over the 2-hour period of observation.  There were no complications.  Specifically the patient did not have nausea or vomiting or headache.   Dissociation was mild and not sig different from the 56 mg dose. Did not experience any emotional relief from disabling depression.wants to increase Spravato to the usual dose.  She is aware insurance is not covering the costs at this time.  No SI acutely.  Pervasive depression continues without variability. Tolerating meds.     Past Psychiatric Medication Trials: Trintellix - Initially effective and then was not effective when re-started Paxil- Ineffective. Helped initially. Prozac-Insomnia Lexapro- Effective and then no longer as effective Zoloft- Initially effective and  then not as effective.  Viibryd Cymbalta- Ineffective. Helped initially. Effexor XR- Effective, well tolerated. Pristiq- Increased anxiety at 150 mg daily Wellbutrin XL-Ineffective.May have increased anxiety. Amitriptyline Nortriptyline-Caused irritability, constipation Auvelity- ineffective Abilify- Helpful but caused severe insomnia. Rexulti-Helpful but caused insomnia. Vraylar Seroquel - Ineffective Risperdal Olanzapine- Ineffective Latuda- Ineffective Caplyta Klonopin- Effective Temazepam- Took during menopause Lunesta- Ineffective Ambien-Ineffective Sonata- Ineffective Dayvigo- partially effective Trazodone- Ineffective Remeron-Ineffective. Does not recall wt gain.  Adderall- Took once and had adverse effect. Concerta- Effective Metadate-Not as effective as Concerta Dexmethylphenidate- Not as effective as Concerta Azstarys- some side effects. Not as effective.  Lamictal- May have had an adverse effects. "Felt weird." Reports worsening s/s.  Lithium Gabapentin Pramipexole- "felt really weird."   ECT #20 with minimal response  AIMS    Flowsheet Row Video Visit from 01/03/2022 in [place] Total Score 0      ECT-MADRS    Flowsheet Row Video Visit from 04/29/2022 in [place] [facility] Video Visit from 02/06/2022 in [facility]  MADRS Total Score 44 23      PHQ2-9    Flowsheet Row Video Visit from 02/06/2022 in [place] Office Visit from 11/16/2020 in [facility] for [facility] [place] Visit from 04/19/2018 in [place] [facility] Total Score 6 0 0  PHQ-9 Total Score 14 -- 2        Review of Systems:  Review of Systems  Constitutional:  Positive for fatigue.  Neurological:  Negative for weakness.  Psychiatric/Behavioral:  Positive for decreased concentration and dysphoric  mood.     Medications: I  have reviewed the patient's current medications.  Current Outpatient Medications  Medication Sig Dispense Refill   clonazePAM (KLONOPIN) 0.5 MG tablet Take 1-1.5 tabs at bedtime and 1/2-1 tab as needed for anxiety. 60 tablet 1   doxycycline (VIBRAMYCIN) 50 MG capsule Take 50 mg by mouth daily.     escitalopram (LEXAPRO) 20 MG tablet Take 1 tablet (20 mg total) by mouth daily. 30 tablet 2   Esketamine HCl, 56 MG Dose, (SPRAVATO, 56 MG DOSE,) 28 MG/DEVICE SOPK Place 56 mg into the nose every 3 (three) days. 2 each 1   Esketamine HCl, 84 MG Dose, (SPRAVATO, 84 MG DOSE,) 28 MG/DEVICE SOPK Place 84 mg into the nose every 3 (three) days. 3 each 5   estradiol (ESTRACE) 0.1 MG/GM vaginal cream USE 1 GM VAGINALLY TWICE A WEEK AT BEDTIME 42.5 g 0   Lemborexant (DAYVIGO) 10 MG TABS Take 1/2-1 tab po QHS 30 tablet 2   MAGNESIUM PO Take by mouth.     Multiple Vitamin (MULTIVITAMIN) tablet Take 1 tablet by mouth daily.     traZODone (DESYREL) 100 MG tablet Take 1 tablet (100 mg total) by mouth at bedtime. Take 1-2 tabs po QHS prn 180 tablet 1   valACYclovir (VALTREX) 500 MG tablet Take one twice daily for 3 days with any symptoms 30 tablet 3   methylphenidate 36 MG PO CR tablet Take 1 tablet (36 mg total) by mouth 2 (two) times daily with breakfast and lunch. (Patient taking differently: Take 36 mg by mouth daily.) 60 tablet 0   No current facility-administered medications for this visit.    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   TRANSAMINASES, SERUM, ELEVATED 08/14/2009    Past Medical  History, Surgical history, Social history, and Family history were reviewed and updated as appropriate.   Please see review of systems for further details on the patient's review from today.   Objective:   Physical Exam:  LMP 12/27/2007 (Exact Date)   Physical Exam Constitutional:      General: She is not in acute distress. Musculoskeletal:        General: No deformity.  Neurological:     Mental Status: She is alert and oriented to person, place, and time.     Coordination: Coordination normal.  Psychiatric:        Attention and Perception: Attention and perception normal. She does not perceive auditory or visual hallucinations.        Mood and Affect: Mood is depressed. Mood is not anxious. Affect is blunt. Affect is not labile, angry or inappropriate.        Speech: Speech normal.        Behavior: Behavior normal. Behavior is not slowed.        Thought Content: Thought content normal. Thought content is not paranoid or delusional. Thought content does not include homicidal or suicidal ideation. Thought content does not include suicidal plan.        Cognition and Memory: Cognition and memory normal.  Judgment: Judgment normal.     Comments: Insight intact     Lab Review:     Component Value Date/Time   NA 138 04/22/2022 0932   K 4.1 04/22/2022 0932   CL 98 04/22/2022 0932   CO2 31 04/22/2022 0932   GLUCOSE 102 (H) 04/22/2022 0932   BUN 10 04/22/2022 0932   CREATININE 0.71 04/22/2022 0932   CREATININE 0.68 01/26/2020 0920   CALCIUM 10.5 04/22/2022 0932   PROT 8.3 04/22/2022 0932   ALBUMIN 5.3 (H) 04/22/2022 0932   AST 82 (H) 04/22/2022 0932   ALT 118 (H) 04/22/2022 0932   ALKPHOS 74 04/22/2022 0932   BILITOT 0.3 04/22/2022 0932   GFRNONAA >90 05/01/2014 1645   GFRAA >90 05/01/2014 1645       Component Value Date/Time   WBC 3.3 (L) 04/22/2022 0932   RBC 4.25 04/22/2022 0932   HGB 14.3 04/22/2022 0932   HGB 14.1 10/26/2014 1034   HCT 40.7 04/22/2022 0932    PLT 245.0 04/22/2022 0932   MCV 95.7 04/22/2022 0932   MCH 34.4 (H) 01/26/2020 0920   MCHC 35.1 04/22/2022 0932   RDW 13.2 04/22/2022 0932   LYMPHSABS 0.9 04/22/2022 0932   MONOABS 0.2 04/22/2022 0932   EOSABS 0.1 04/22/2022 0932   BASOSABS 0.0 04/22/2022 0932    No results found for: "POCLITH", "LITHIUM"   No results found for: "PHENYTOIN", "PHENOBARB", "VALPROATE", "CBMZ"   .res Assessment: Plan:    Heather Davila was seen today for follow-up and depression.  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  Patient was administered first dose Spravato 84 mg intranasally today.  The patient experienced the typical dissociation which gradually resolved over the 2-hour period of observation.  There were no complications.  Specifically the patient did not have nausea or vomiting or headache.  Blood pressures remained within normal ranges at the 40-minute and 2-hour follow-up intervals.  By the time the 2-hour observation period was met the patient was alert and oriented and able to exit without assistance.  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  See nursing note for further details.  Regarding med options.  She has obviously been thoroughly tried on numerous medications.  The only antidepressant category that she has not taken is MAO inhibitors which are not compatible with Spravato.  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 Disc this with her.  She will consider.  Tolerated the  increase Spravato to '84mg'$  in hopes of  improvement as soon as possible.  Tolerating meds. No med changes.  FU twice weekly Spravato for 4 weeks and reevaluate.  Heather Parents, MD, DFAPA    Please see After Visit Summary for patient specific instructions.  Future Appointments  Date Time Provider [place]  06/03/2022 10:30 AM [name], PMHNP CP-CP None  08/18/2022  2:35 PM Megan Salon, MD [facility]    No orders of the defined types were placed in this encounter.   ------------------------------- T

## 2022-05-27 ENCOUNTER — Ambulatory Visit (INDEPENDENT_AMBULATORY_CARE_PROVIDER_SITE_OTHER): Payer: 59 | Admitting: Psychiatry

## 2022-05-27 ENCOUNTER — Ambulatory Visit: Payer: Self-pay

## 2022-05-27 ENCOUNTER — Encounter: Payer: Self-pay | Admitting: Psychiatry

## 2022-05-27 VITALS — BP 135/70 | HR 74

## 2022-05-27 DIAGNOSIS — F339 Major depressive disorder, recurrent, unspecified: Secondary | ICD-10-CM | POA: Diagnosis not present

## 2022-05-27 DIAGNOSIS — G4700 Insomnia, unspecified: Secondary | ICD-10-CM

## 2022-05-27 DIAGNOSIS — F411 Generalized anxiety disorder: Secondary | ICD-10-CM

## 2022-05-27 DIAGNOSIS — F900 Attention-deficit hyperactivity disorder, predominantly inattentive type: Secondary | ICD-10-CM | POA: Diagnosis not present

## 2022-05-27 NOTE — Progress Notes (Signed)
Heather Davila [id number] 1960/09/24 62 y.o.  Subjective:   Patient ID:  Heather Davila is a 62 y.o. (DOB 12/08/1960) female.  Chief Complaint:  Chief Complaint  Patient presents with   Follow-up   Depression   Anxiety    HPI Heather Davila presents to the office today for follow-up of treatment resistant recurrent depression, generalized anxiety disorder and insomnia.  Almost too numerous to count med failures. She is here for Spravato administration for her treatment resistant depression.  05/21/22 appt noted: Patient was administered first dose Spravato 56 mg intranasally today.  The patient experienced the typical dissociation which gradually resolved over the 2-hour period of observation.  There were no complications.  Specifically the patient did not have nausea or vomiting or headache.   Dissociation was mild. Did not experience any emotional relief from disabling depression.wants to increase Spravato to the usual dose.  She is aware insurance is not covering the costs at this time.  No SI acutely. Tolerating meds.    05/23/22 appt noted: Patient was administered Spravato 84 mg intranasally today.  The patient experienced the typical dissociation which gradually resolved over the 2-hour period of observation.  There were no complications.  Specifically the patient did not have nausea or vomiting.   Dissociation was greater than last dose and a little bothersome DT some HA. Tolerating meds.   Mood has not changed significantly thus far with Spravato.  05/27/22 appt noted: Patient was administered Spravato 84 mg intranasally today.  The patient experienced the typical dissociation which gradually resolved over the 2-hour period of observation.  There were no complications.  Specifically the patient did not have nausea or vomiting or headache. Today she has felt a little relief from the pressing heaviness of depression but a little more anxiety.  During administration of Spravato she  listens to Prue and was able to relax a little more with the feeling of dissociation that was mildly bothersome. Husband was also in on the session today and asked some questions about IV ketamine versus nasal spray ketamine.  Past Psychiatric Medication Trials: Trintellix - Initially effective and then was not effective when re-started Paxil- Ineffective. Helped initially. Prozac-Insomnia Lexapro- Effective and then no longer as effective Zoloft- Initially effective and then not as effective.  Viibryd Cymbalta- Ineffective. Helped initially. Effexor XR- Effective, well tolerated. Pristiq- Increased anxiety at 150 mg daily Wellbutrin XL-Ineffective.May have increased anxiety. Amitriptyline Nortriptyline-Caused irritability, constipation Auvelity- ineffective Abilify- Helpful but caused severe insomnia. Rexulti-Helpful but caused insomnia. Vraylar Seroquel - Ineffective Risperdal Olanzapine- Ineffective Latuda- Ineffective Caplyta Klonopin- Effective Temazepam- Took during menopause Lunesta- Ineffective Ambien-Ineffective Sonata- Ineffective Dayvigo- partially effective Trazodone- Ineffective Remeron-Ineffective. Does not recall wt gain.  Adderall- Took once and had adverse effect. Concerta- Effective Metadate-Not as effective as Concerta Dexmethylphenidate- Not as effective as Concerta Azstarys- some side effects. Not as effective.  Lamictal- May have had an adverse effects. "Felt weird." Reports worsening s/s.  Lithium Gabapentin Pramipexole- "felt really weird."   ECT #20 with minimal response. H says it was partly helpful.  AIMS    Flowsheet Row Video Visit from 01/03/2022 in [place] Total Score 0      ECT-MADRS    Flowsheet Row Video Visit from 04/29/2022 in [place] Video Visit from 02/06/2022 in [facility]  MADRS Total Score 44 23      PHQ2-9     Flowsheet Row Video Visit from 02/06/2022 in [facility]  [facility] Office Visit from 11/16/2020 in [facility] [place] Visit from 04/19/2018 in [place] at [facility] Total Score 6 0 0  PHQ-9 Total Score 14 -- 2        Review of Systems:  Review of Systems  Constitutional:  Positive for fatigue.  Cardiovascular:  Negative for palpitations.  Neurological:  Negative for weakness.  Psychiatric/Behavioral:  Positive for decreased concentration and dysphoric mood.     Medications: I have reviewed the patient's current medications.  Current Outpatient Medications  Medication Sig Dispense Refill   clonazePAM (KLONOPIN) 0.5 MG tablet Take 1-1.5 tabs at bedtime and 1/2-1 tab as needed for anxiety. 60 tablet 1   doxycycline (VIBRAMYCIN) 50 MG capsule Take 50 mg by mouth daily.     escitalopram (LEXAPRO) 20 MG tablet Take 1 tablet (20 mg total) by mouth daily. 30 tablet 2   Esketamine HCl, 56 MG Dose, (SPRAVATO, 56 MG DOSE,) 28 MG/DEVICE SOPK Place 56 mg into the nose every 3 (three) days. 2 each 1   Esketamine HCl, 84 MG Dose, (SPRAVATO, 84 MG DOSE,) 28 MG/DEVICE SOPK Place 84 mg into the nose every 3 (three) days. 3 each 5   estradiol (ESTRACE) 0.1 MG/GM vaginal cream USE 1 GM VAGINALLY TWICE A WEEK AT BEDTIME 42.5 g 0   Lemborexant (DAYVIGO) 10 MG TABS Take 1/2-1 tab po QHS 30 tablet 2   MAGNESIUM PO Take by mouth.     Multiple Vitamin (MULTIVITAMIN) tablet Take 1 tablet by mouth daily.     traZODone (DESYREL) 100 MG tablet Take 1 tablet (100 mg total) by mouth at bedtime. Take 1-2 tabs po QHS prn 180 tablet 1   valACYclovir (VALTREX) 500 MG tablet Take one twice daily for 3 days with any symptoms 30 tablet 3   methylphenidate 36 MG PO CR tablet Take 1 tablet (36 mg total) by mouth 2 (two) times daily with breakfast and lunch. (Patient taking differently: Take 36 mg by mouth daily.) 60 tablet 0    No current facility-administered medications for this visit.    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   TRANSAMINASES, SERUM, ELEVATED 08/14/2009    Past Medical History, Surgical history, Social history, and Family history were reviewed and updated as appropriate.   Please see review of systems for further details on the patient's review from today.   Objective:   Physical Exam:  LMP 12/27/2007 (Exact Date)   Physical Exam Constitutional:      General: She is not in acute distress. Musculoskeletal:        General: No deformity.  Neurological:     Mental Status: She is alert and oriented to person, place, and time.     Coordination: Coordination normal.  Psychiatric:        Attention and Perception: Attention and perception normal. She does not perceive auditory or visual hallucinations.        Mood and Affect: Mood is anxious and  depressed. Affect is blunt. Affect is not labile, angry or inappropriate.        Speech: Speech normal.        Behavior: Behavior normal. Behavior is not slowed.        Thought Content: Thought content normal. Thought content is not paranoid or delusional. Thought content does not include homicidal or suicidal ideation. Thought content does not include suicidal plan.        Cognition and Memory: Cognition and memory normal.        Judgment: Judgment normal.     Comments: Insight intact     Lab Review:     Component Value Date/Time   NA 138 04/22/2022 0932   K 4.1 04/22/2022 0932   CL 98 04/22/2022 0932   CO2 31 04/22/2022 0932   GLUCOSE 102 (H) 04/22/2022  0932   BUN 10 04/22/2022 0932   CREATININE 0.71 04/22/2022 0932   CREATININE 0.68 01/26/2020 0920   CALCIUM 10.5 04/22/2022 0932   PROT 8.3 04/22/2022 0932   ALBUMIN 5.3 (H) 04/22/2022 0932   AST 82 (H) 04/22/2022 0932   ALT 118 (H) 04/22/2022 0932   ALKPHOS 74 04/22/2022 0932   BILITOT 0.3 04/22/2022 0932   GFRNONAA >90 05/01/2014 1645   GFRAA >90 05/01/2014 1645       Component Value Date/Time   WBC 3.3 (L) 04/22/2022 0932   RBC 4.25 04/22/2022 0932   HGB 14.3 04/22/2022 0932   HGB 14.1 10/26/2014 1034   HCT 40.7 04/22/2022 0932   PLT 245.0 04/22/2022 0932   MCV 95.7 04/22/2022 0932   MCH 34.4 (H) 01/26/2020 0920   MCHC 35.1 04/22/2022 0932   RDW 13.2 04/22/2022 0932   LYMPHSABS 0.9 04/22/2022 0932   MONOABS 0.2 04/22/2022 0932   EOSABS 0.1 04/22/2022 0932   BASOSABS 0.0 04/22/2022 0932    No results found for: "POCLITH", "LITHIUM"   No results found for: "PHENYTOIN", "PHENOBARB", "VALPROATE", "CBMZ"   .res Assessment: Plan:    Sarye was seen today for follow-up, depression and anxiety.  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  Patient was administered Spravato 84 mg intranasally today.  The patient experienced the typical dissociation which gradually resolved over the 2-hour period of observation.  There were no complications.  Specifically the patient did not have nausea or vomiting. But mild headache.  Blood pressures remained within normal ranges at the 40-minute and 2-hour follow-up intervals.  By the time the 2-hour observation period was met the patient was alert and oriented and able to exit without assistance.  Patient feels the Spravato administration is helpful for the treatment resistant depression and  would like to continue the treatment.  See nursing note for further details.Tolerated the Spravato to '84mg'$  in hopes of improvement as soon as possible.  Regarding med options.  She has obviously been thoroughly tried on numerous medications.  The only antidepressant category that she has not taken is MAO inhibitors which are not compatible with Spravato.  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 Disc this with her previously.  She will consider.  Today she was a little less depressed but a little more anxious.  Answered  her questions and that of her husband about the difference between IV ketamine and nasal spray ketamine and overall treatment recommendations.  They agreed to continue the current treatment plan with Spravato.  Tolerating meds. No med changes.  FU twice weekly Spravato for 4 weeks and reevaluate.  Lynder Parents, MD, DFAPA    Please see After Visit Summary for patient specific instructions.  Future Appointments  Date Time Provider [place]  05/29/2022  9:00 AM CP-NURSE CP-CP None  05/29/2022  9:00 AM Mozingo, Berdie Ogren, NP CP-CP None  06/03/2022 10:30 AM [name], PMHNP CP-CP None  08/18/2022  2:35 PM Megan Salon, MD [facility]    No orders of the defined types were placed in this encounter.   ------------------------------- T

## 2022-05-28 NOTE — Progress Notes (Signed)
Nurse visit:   Patient arrived for her 1st Spravato treatment. Pt is being treated for Treatment Resistant Depression, the starting dose is 56 mg (2 of the 28 mg) nasal sprays. Pt sees [name], NP but Dr. Clovis Pu will follow pt's care during Spravato treatments. Patient taken to treatment room, I explained and discussed her treatment and how the schedule should go, along with any side effects that may occur. Answered any questions and concerns the patient had. Pt's Spravato is delivered through [place] and pt is paying out of pocket until her insurance is verified and showing up for billing. Spravato medication is stored at doctors office per REMS/FDA guidelines. The medication is required to be locked behind two doors per FDA/REMS Protocol. Medication is also disposed of properly per regulations.    Vital Signs assessed at 1:15 PM 135/83, pulse 69, pulse ox 92%. Instructed pt to blow her nose and recline back slightly to prevent any of medication dripping out of her nose. Pt. Given 1st dose (28 mg inhaler), then 5 minutes between 2nd dose. No complaints of nausea reported. Pt did not bring anything to listen to, so she just reclined back and closed her eyes. After 40 minutes I went to assess her vital signs, no side effects or symptoms reported, B/P 144/80, pulse 69. Informed pt she could rest in the chair until Dr. Clovis Pu came in to discuss her treatment at the end. She had no questions/concerns, Nurse was with pt a total of 60 minutes but pt observed for 120 minutes per protocol. Discharge vital signs were B/P 133/76, pulse 74. Dr. Clovis Pu visited with pt. She reported dissociation and Dr. Clovis Pu felt her next dose should be 84 mg. Pt agreed. Instructed pt to contact [place] to pay for medication but they deliver on M/W/F and I needed the medication before the next treatment. She verbalized understanding, next treatment was scheduled for January 26th, 2023.  LOT # [id number] EXP MAY 2025

## 2022-05-29 ENCOUNTER — Telehealth: Payer: Self-pay

## 2022-05-29 ENCOUNTER — Ambulatory Visit: Payer: Self-pay | Admitting: Psychiatry

## 2022-05-29 ENCOUNTER — Ambulatory Visit: Payer: Self-pay

## 2022-05-29 VITALS — BP 146/79 | HR 65

## 2022-05-29 DIAGNOSIS — G4700 Insomnia, unspecified: Secondary | ICD-10-CM

## 2022-05-29 DIAGNOSIS — F900 Attention-deficit hyperactivity disorder, predominantly inattentive type: Secondary | ICD-10-CM

## 2022-05-29 DIAGNOSIS — F339 Major depressive disorder, recurrent, unspecified: Secondary | ICD-10-CM

## 2022-05-29 DIAGNOSIS — F411 Generalized anxiety disorder: Secondary | ICD-10-CM

## 2022-05-29 NOTE — Progress Notes (Signed)
NURSE NOTE:    Pt arrived for her Spravato Treatment, she was taken to treatment room. Her vitals signs were taken and her B/P was up from her regular readings. She did report a intense headache woke her up this morning and she still has it. She reports taking Ibuprofen right before she arrived at the office. Pt has had 2 headaches after each 84 mg Spravato treatment, she reports the one on Tuesday, January 30th was pretty intense after her treatment. I notified Dr. Clovis Pu before proceeding to administer her Spravato.   After discussing with Dr. Clovis Pu he advised not to start Spravato today and see how she feels either Friday or next week. He also advised she should not take Concerta prior to a Spravato treatment.  Informed pt of Dr. Casimiro Needle recommendation, she agreed and reports she normally doesn't take it but for some reason this morning she did. Will contact me in the morning if there are any worsening side effects or symptoms.

## 2022-05-29 NOTE — Telephone Encounter (Signed)
Prior Authorization submitted and approved for Spravato 84 mg with [employer], effective 04/29/2022-05/29/2023, Case ID [id number]   Rx's sent to [facility].

## 2022-05-29 NOTE — Progress Notes (Signed)
NURSES NOTE:       Patient arrived for her  3rd Spravato treatment. Pt is being treated for Treatment Resistant Depression, the starting dose is 56 mg (2 of the 28 mg) nasal sprays. Pt will be receiving 84 mg, this is her maintenance dose. Pt sees [name], NP but Dr. Clovis Pu will follow pt's care during Spravato treatments. Patient taken to treatment room, I explained and discussed her treatment and how the schedule should go, along with any side effects that may occur. Answered any questions and concerns the patient had. Pt's Spravato is delivered through [place] and pt is paying out of pocket until her insurance is verified and showing up for billing. Spravato medication is stored at doctors office per REMS/FDA guidelines. The medication is required to be locked behind two doors per FDA/REMS Protocol. Medication is also disposed of properly per regulations.      Vital Signs assessed at 2:08 PM 128/72, pulse 71, pulse ox 92%. Instructed pt to blow her nose and recline back slightly to prevent any of medication dripping out of her nose. Pt. Given 1st dose (28 mg inhaler), then 5 minutes between 2nd dose and 3rd dose. No complaints of nausea/vomiting reported. Pt did not bring anything to listen to, so she just reclined back and closed her eyes. After 40 minutes I went to assess her vital signs, no side effects or symptoms reported, B/P 144/75, pulse 65. Informed pt she could rest in the chair until Dr. Clovis Pu came in to discuss her treatment at the end. She had no questions/concerns, Nurse was with pt a total of 60 minutes but pt observed for 120 minutes per protocol. Discharge vital signs were at 4:00 PM, B/P 135/70, pulse 74. Pt reports a pretty intense headache again today, offered Tylenol or Ibuprofen but declined due to she will be leaving soon and has some to take. Dr. Clovis Pu visited with pt. Pt reported to Dr. Clovis Pu that she has dissociation as well, she seems clear when ready to leave with her  husband. Pt agreed. Instructed pt to contact [place] to pay for medication but they deliver on M/W/F and I needed the medication before the next treatment. She verbalized understanding, next treatment was scheduled for February 1st, 2024.   LOT # '[id number]'$ [id number] EXP JAN 2027

## 2022-05-30 ENCOUNTER — Encounter: Payer: Self-pay | Admitting: Psychiatry

## 2022-05-30 ENCOUNTER — Ambulatory Visit: Payer: 59 | Admitting: Behavioral Health

## 2022-05-30 ENCOUNTER — Ambulatory Visit: Payer: Self-pay

## 2022-05-30 ENCOUNTER — Ambulatory Visit (INDEPENDENT_AMBULATORY_CARE_PROVIDER_SITE_OTHER): Payer: 59 | Admitting: Psychiatry

## 2022-05-30 VITALS — BP 125/76 | HR 62

## 2022-05-30 DIAGNOSIS — F339 Major depressive disorder, recurrent, unspecified: Secondary | ICD-10-CM

## 2022-05-30 DIAGNOSIS — F411 Generalized anxiety disorder: Secondary | ICD-10-CM

## 2022-05-30 DIAGNOSIS — F900 Attention-deficit hyperactivity disorder, predominantly inattentive type: Secondary | ICD-10-CM

## 2022-05-30 DIAGNOSIS — G4700 Insomnia, unspecified: Secondary | ICD-10-CM | POA: Diagnosis not present

## 2022-05-30 NOTE — Progress Notes (Addendum)
Heather Davila [id number] December 02, 1960 62 y.o.  Subjective:   Patient ID:  Heather Davila is a 62 y.o. (DOB [birth year]) female.  Chief Complaint:  Chief Complaint  Patient presents with   Follow-up   Depression   Fatigue    HPI Heather Davila presents to the office today for follow-up of treatment resistant recurrent depression, generalized anxiety disorder and insomnia.  Almost too numerous to count med failures. She is here for Spravato administration for her treatment resistant depression.  05/21/22 appt noted: Patient was administered first dose Spravato 56 mg intranasally today.  The patient experienced the typical dissociation which gradually resolved over the 2-hour period of observation.  There were no complications.  Specifically the patient did not have nausea or vomiting or headache.   Dissociation was mild. Did not experience any emotional relief from disabling depression.wants to increase Spravato to the usual dose.  She is aware insurance is not covering the costs at this time.  No SI acutely. Tolerating meds.    05/23/22 appt noted: Patient was administered Spravato 84 mg intranasally today.  The patient experienced the typical dissociation which gradually resolved over the 2-hour period of observation.  There were no complications.  Specifically the patient did not have nausea or vomiting.   Dissociation was greater than last dose and a little bothersome DT some HA. Tolerating meds.   Mood has not changed significantly thus far with Spravato.  05/27/22 appt noted: Patient was administered Spravato 84 mg intranasally today.  The patient experienced the typical dissociation which gradually resolved over the 2-hour period of observation.  There were no complications.  Specifically the patient did not have nausea or vomiting or headache. Today she has felt a little relief from the pressing heaviness of depression but a little more anxiety.  During administration of Spravato she  listens to Heather Davila and was able to relax a little more with the feeling of dissociation that was mildly bothersome. Husband was also in on the session today and asked some questions about IV ketamine versus nasal spray ketamine.  05/29/22 appt noted: Cancelled today DT hypertension  Past Psychiatric Medication Trials: Trintellix - Initially effective and then was not effective when re-started Paxil- Ineffective. Helped initially. Prozac-Insomnia Lexapro- Effective and then no longer as effective Zoloft- Initially effective and then not as effective.  Viibryd Cymbalta- Ineffective. Helped initially. Effexor XR- Effective, well tolerated. Pristiq- Increased anxiety at 150 mg daily Wellbutrin XL-Ineffective.May have increased anxiety. Amitriptyline Nortriptyline-Caused irritability, constipation Auvelity- ineffective Abilify- Helpful but caused severe insomnia. Rexulti-Helpful but caused insomnia. Vraylar Seroquel - Ineffective Risperdal Olanzapine- Ineffective Latuda- Ineffective Caplyta Klonopin- Effective Temazepam- Took during menopause Lunesta- Ineffective Ambien-Ineffective Sonata- Ineffective Dayvigo- partially effective Trazodone- Ineffective Remeron-Ineffective. Does not recall wt gain.  Adderall- Took once and had adverse effect. Concerta- Effective Metadate-Not as effective as Concerta Dexmethylphenidate- Not as effective as Concerta Azstarys- some side effects. Not as effective.  Lamictal- May have had an adverse effects. "Felt weird." Reports worsening s/s.  Lithium Gabapentin Pramipexole- "felt really weird."   ECT #20 with minimal response. H says it was partly helpful.  AIMS    Flowsheet Row Video Visit from 01/03/2022 in [place] Total Score 0      ECT-MADRS    Flowsheet Row Video Visit from 04/29/2022 in [place] Video Visit from 02/06/2022 in [facility]  MADRS Total Score 44 23      PHQ2-9    Flowsheet  Row Video Visit from 02/06/2022 in [place] Office Visit from 11/16/2020 in [facility] [place] Visit from 04/19/2018 in [place] [facility] Total Score 6 0 0  PHQ-9 Total Score 14 -- 2        Review of Systems:  Review of Systems  Constitutional:  Positive for fatigue.  Cardiovascular:  Negative for palpitations.  Neurological:  Negative for tremors and weakness.  Psychiatric/Behavioral:  Positive for decreased concentration and dysphoric mood.     Medications: I have reviewed the patient's current medications.  Current Outpatient Medications  Medication Sig Dispense Refill   clonazePAM (KLONOPIN) 0.5 MG tablet Take 1-1.5 tabs at bedtime and 1/2-1 tab as needed for anxiety. 60 tablet 1   doxycycline (VIBRAMYCIN) 50 MG capsule Take 50 mg by mouth daily.     Esketamine HCl, 84 MG Dose, (SPRAVATO, 84 MG DOSE,) 28 MG/DEVICE SOPK Place 84 mg into the nose every 3 (three) days. 3 each 5   estradiol (ESTRACE) 0.1 MG/GM vaginal cream USE 1 GM VAGINALLY TWICE A WEEK AT BEDTIME 42.5 g 0   MAGNESIUM PO Take by mouth.     Multiple Vitamin (MULTIVITAMIN) tablet Take 1 tablet by mouth daily.     traZODone (DESYREL) 100 MG tablet Take 1 tablet (100 mg total) by mouth at bedtime. Take 1-2 tabs po QHS prn 180 tablet 1   valACYclovir (VALTREX) 500 MG tablet Take one twice daily for 3 days with any symptoms (Patient not taking: Reported on 06/03/2022) 30 tablet 3   escitalopram (LEXAPRO) 20 MG tablet Take 1 tablet (20 mg total) by mouth daily. 30 tablet 2   Esketamine HCl, 56 MG Dose, (SPRAVATO, 56 MG DOSE,) 28 MG/DEVICE SOPK Place 56 mg into the nose every 3 (three) days. (Patient not taking: Reported on 05/30/2022) 2 each 1   gabapentin (NEURONTIN) 100 MG capsule Take 1 capsule (100 mg total) by mouth 3 (three) times daily  as needed. Take 1 capsule three times daily 90 capsule 1   Lemborexant (DAYVIGO) 10 MG TABS Take 1/2-1 tab po QHS 30 tablet 2   No current facility-administered medications for this visit.    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   TRANSAMINASES, SERUM, ELEVATED 08/14/2009    Past Medical History, Surgical history, Social history, and Family history were reviewed and updated as appropriate.   Please see review of systems for further details on the patient's review from today.   Objective:   Physical Exam:  LMP 12/27/2007 (Exact Date)   Physical Exam Constitutional:      General: She is not in acute distress. Musculoskeletal:        General: No deformity.  Neurological:     Mental Status: She is alert and oriented to person, place, and time.     Coordination: Coordination normal.  Psychiatric:        Attention and Perception: Attention and perception normal. She does not perceive auditory or  visual hallucinations.        Mood and Affect: Mood is anxious and depressed. Affect is blunt. Affect is not labile, angry or inappropriate.        Speech: Speech normal.        Behavior: Behavior normal. Behavior is not slowed.        Thought Content: Thought content normal. Thought content is not paranoid or delusional. Thought content does not include homicidal or suicidal ideation. Thought content does not include suicidal plan.        Cognition and Memory: Cognition and memory normal.        Judgment: Judgment normal.     Comments: Insight intact Less intense depression     Lab Review:      Component Value Date/Time   NA 138 04/22/2022 0932   K 4.1 04/22/2022 0932   CL 98 04/22/2022 0932   CO2 31 04/22/2022 0932   GLUCOSE 102 (H) 04/22/2022 0932   BUN 10 04/22/2022 0932   CREATININE 0.71 04/22/2022 0932   CREATININE 0.68 01/26/2020 0920   CALCIUM 10.5 04/22/2022 0932   PROT 8.3 04/22/2022 0932   ALBUMIN 5.3 (H) 04/22/2022 0932   AST 82 (H) 04/22/2022 0932   ALT 118 (H) 04/22/2022 0932   ALKPHOS 74 04/22/2022 0932   BILITOT 0.3 04/22/2022 0932   GFRNONAA >90 05/01/2014 1645   GFRAA >90 05/01/2014 1645       Component Value Date/Time   WBC 3.3 (L) 04/22/2022 0932   RBC 4.25 04/22/2022 0932   HGB 14.3 04/22/2022 0932   HGB 14.1 10/26/2014 1034   HCT 40.7 04/22/2022 0932   PLT 245.0 04/22/2022 0932   MCV 95.7 04/22/2022 0932   MCH 34.4 (H) 01/26/2020 0920   MCHC 35.1 04/22/2022 0932   RDW 13.2 04/22/2022 0932   LYMPHSABS 0.9 04/22/2022 0932   MONOABS 0.2 04/22/2022 0932   EOSABS 0.1 04/22/2022 0932   BASOSABS 0.0 04/22/2022 0932    No results found for: "POCLITH", "LITHIUM"   No results found for: "PHENYTOIN", "PHENOBARB", "VALPROATE", "CBMZ"   .res Assessment: Plan:    Heather Davila was seen today for follow-up, depression and fatigue.  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  Patient was administered Spravato 84 mg intranasally today.  The patient experienced the typical dissociation which gradually resolved over the 2-hour period of observation.  There were no complications.  Specifically the patient did not have nausea or vomiting. But mild headache.  Blood pressures remained within normal ranges at the 40-minute and 2-hour follow-up intervals.  By the time the 2-hour observation period was met the patient  was alert and oriented and able to exit without assistance.  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  See nursing note for further details.Tolerated the Spravato to 25m in hopes of improvement as soon as possible.  Regarding med options.  She has obviously been thoroughly tried on numerous medications.  The only antidepressant category that she has not taken is MAO inhibitors which are not compatible with Spravato.  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 Disc this with her previously.  She will consider.  Today she was a little less depressed and more hopeful  Tolerating meds. No med changes.  FU twice weekly Spravato for 4 weeks and reevaluate.  Heather Parents, MD, DFAPA    Please see After Visit Summary for patient specific instructions.  Future Appointments  Date Time Provider [place]  07/01/2022  9:00 AM [name], PMHNP [facility] None  08/18/2022  2:35 PM Megan Salon, MD [facility]    No orders of the defined types were placed in this encounter.   ------------------------------- T

## 2022-06-02 NOTE — Progress Notes (Signed)
Nurse visit:   Patient arrived for her 2nd Spravato treatment. Pt is being treated for Treatment Resistant Depression, the starting dose is 56 mg (2 of the 28 mg) nasal sprays. Pt will be receiving 84 mg today, first dose. Pt sees [name], NP but Dr. Clovis Pu will follow pt's care during Spravato treatments. Patient taken to treatment room, I explained and discussed her treatment and how the schedule should go, along with any side effects that may occur. Answered any questions and concerns the patient had. Pt's Spravato is delivered through [place] and pt is paying out of pocket until her insurance is verified and showing up for billing. Spravato medication is stored at doctors office per REMS/FDA guidelines. The medication is required to be locked behind two doors per FDA/REMS Protocol. Medication is also disposed of properly per regulations.      Vital Signs assessed at 9:10 AM 110/64, pulse 59, pulse ox 92%. Instructed pt to blow her nose and recline back slightly to prevent any of medication dripping out of her nose. Pt. Given 1st dose (28 mg inhaler), then 5 minutes between 2nd dose and 3rd dose. No complaints of nausea/vomiting reported. Pt did not bring anything to listen to, so she just reclined back and closed her eyes. After 40 minutes I went to assess her vital signs, no side effects or symptoms reported, B/P 144/80, pulse 69. Informed pt she could rest in the chair until Dr. Clovis Pu came in to discuss her treatment at the end. She had no questions/concerns, Nurse was with pt a total of 60 minutes but pt observed for 120 minutes per protocol. Discharge vital signs were at 11:05 AM, B/P 127/7, pulse 74. Dr. Clovis Pu visited with pt. Pt reported to Dr. Clovis Pu she has developed a headache with this dose.  She reported dissociation as well, she seems clear when ready to leave with her husband. Pt agreed. Instructed pt to contact [place] to pay for medication but they deliver on M/W/F and I needed the  medication before the next treatment. She verbalized understanding, next treatment was scheduled for January 30th, 2024.   LOT # '23MG'$ [id number] EXP JAN 2027

## 2022-06-02 NOTE — Progress Notes (Signed)
NURSES NOTE:           Patient arrived for her 4th Spravato treatment. Pt was unable to get treatment yesterday due to intense headache and B/P slightly elevated for her. Dr. Clovis Pu advised she not take treatment until headache resolved. Pt reports her headache finally went away yesterday and none reported at this time. Pt is being treated for Treatment Resistant Depression, the starting dose is 56 mg (2 of the 28 mg) nasal sprays. Pt will be receiving 84 mg, this is her maintenance dose. Pt sees [name], NP but Dr. Clovis Pu will follow pt's care during Spravato treatments. Patient taken to treatment room, I explained and discussed her treatment and how the schedule should go, along with any side effects that may occur. Answered any questions and concerns the patient had. Pt's Spravato is delivered through [place] and pt is paying out of pocket until her insurance is verified and showing up for billing. Spravato medication is stored at doctors office per REMS/FDA guidelines. The medication is required to be locked behind two doors per FDA/REMS Protocol. Medication is also disposed of properly per regulations.      Vital Signs assessed at 9:05 AM 136/86, pulse 59, pulse ox 92%. Instructed pt to blow her nose and recline back slightly to prevent any of medication dripping out of her nose. Pt. Given 1st dose (28 mg inhaler), then 5 minutes between 2nd dose and 3rd dose. No complaints of nausea/vomiting reported. Pt did listen to music today, she just reclined back and closed her eyes. After 40 minutes I went to assess her vital signs, no side effects or symptoms reported, B/P 137/81, pulse 63. Informed pt she could rest in the chair until Dr. Clovis Pu came in to discuss her treatment at the end. She had no questions/concerns, Nurse was with pt a total of 60 minutes but pt observed for 120 minutes per protocol. Discharge vital signs were at 11:05 AM, B/P 125/76, pulse 62. Pt reports a slight headache again  today, offered Tylenol or Ibuprofen but declined due to she will be leaving soon and has some to take. She reports dissociation but just wanted it to get over quickly today for some reason. She seems clear when ready to leave with her husband. Pt agreed. Also found out while pt was here that her insurance is not covering her medication and visits and it will be effective 04/28/2022. Pt has an upcoming apt with [name] on February 6th so she will get her treatment after pt's visit with her.   LOT # [id number] EXP NOV 2026

## 2022-06-03 ENCOUNTER — Encounter: Payer: Self-pay | Admitting: Psychiatry

## 2022-06-03 ENCOUNTER — Ambulatory Visit: Payer: 59

## 2022-06-03 ENCOUNTER — Ambulatory Visit (INDEPENDENT_AMBULATORY_CARE_PROVIDER_SITE_OTHER): Payer: 59 | Admitting: Psychiatry

## 2022-06-03 VITALS — BP 106/73 | HR 61

## 2022-06-03 DIAGNOSIS — F339 Major depressive disorder, recurrent, unspecified: Secondary | ICD-10-CM

## 2022-06-03 DIAGNOSIS — F411 Generalized anxiety disorder: Secondary | ICD-10-CM | POA: Diagnosis not present

## 2022-06-03 DIAGNOSIS — F419 Anxiety disorder, unspecified: Secondary | ICD-10-CM | POA: Diagnosis not present

## 2022-06-03 DIAGNOSIS — G4700 Insomnia, unspecified: Secondary | ICD-10-CM | POA: Diagnosis not present

## 2022-06-03 MED ORDER — DAYVIGO 10 MG PO TABS: ORAL_TABLET | ORAL | 2 refills | Status: DC

## 2022-06-03 MED ORDER — GABAPENTIN 100 MG PO CAPS: 100.0000 mg | ORAL_CAPSULE | Freq: Three times a day (TID) | ORAL | 1 refills | Status: DC | PRN

## 2022-06-03 MED ORDER — ESCITALOPRAM OXALATE 20 MG PO TABS: 20.0000 mg | ORAL_TABLET | Freq: Every day | ORAL | 2 refills | Status: DC

## 2022-06-03 NOTE — Progress Notes (Signed)
NURSES NOTE:     Patient arrived for her 5th Spravato treatment. Pt had appointment with her provider today, [name], NP. Pt is being treated for Treatment Resistant Depression, the starting dose is 56 mg (2 of the 28 mg) nasal sprays. Pt will be receiving 84 mg, this is her maintenance dose. Patient taken to treatment room, I explained and discussed her treatment and how the schedule should go, along with any side effects that may occur. Answered any questions and concerns the patient had. Pt's Spravato is delivered through [place] and she is now billing with her insurance. Spravato medication is stored at doctors office per REMS/FDA guidelines. The medication is required to be locked behind two doors per FDA/REMS Protocol. Medication is also disposed of properly per regulations.      Vital Signs assessed at 11:10 AM 133/73, pulse 61, pulse ox 92%. Instructed pt to blow her nose and recline back slightly to prevent any of medication dripping out of her nose. Pt. Given 1st dose (28 mg inhaler), then 5 minutes between 2nd dose and 3rd dose. No complaints of nausea/vomiting reported. Pt did listen to music today, she just reclined back and closed her eyes. After 40 minutes I went to assess her vital signs, no side effects or symptoms reported, B/P 144/74, pulse 63. Informed pt she could rest in the chair until [name] came in to discuss her treatment at the end. She had no questions/concerns, Nurse was with pt a total of 60 minutes but pt observed for 120 minutes per protocol. Discharge vital signs were at 1:00 PM, B/P 106/73, pulse 61. Pt reports a slight headache but nothing like she has been experiencing. She reports dissociation she was clear upon her discharge, she left with her husband. She scheduled her next apt Thursday morning at 8:30 AM while her husband goes to his doctors apt. Pt is doing more and getting out of house more.    LOT # '[id number]'$ [id number] EXP JAN 2027

## 2022-06-03 NOTE — Progress Notes (Unsigned)
Heather Davila [id number] [birth year] 62 y.o.  Subjective:   Patient ID:  Heather Davila is a 62 y.o. (DOB 13-Jul-1960) female.  Chief Complaint: No chief complaint on file.   HPI [name] presents to the office today for follow-up of *** She reports that Spravato is "helping a little. I don't think my antiepressant is helping at all." She reports that depression has been slightly better. She reports that she has stopped ECT and had limited benefit with it. She reports that she continues to have memory difficulties. She reports that she is continuing to have diffoculty with some tasks, such as banking. She reports that this has been since ECT. She reporst that her concentration is "not so good." She has not been taking Concerta due to elevated BP.   She reports having increased anxiety. She reports feeling "nervous, I can't catch my breath type thing." Denies panic attacks.   She reports that Trazodone has been helpful at night and she feels calmer afterwards. Able to sleep about 10 hours with dayvigo and Trazodone. Slight increase in energy and motivation. She reports that she has been thinking about starting some things back. Has started reading her Bible again. Minimal enjoyment in things. Attended a Bible study at a friend's house yesterday. Taking her dog to a file for runs on occasion. She has been cooking again. Talking to children. Appetite is "alright, not bad." Denies SI.   Thinking about seeing a therapist at [facility].   Taking Klonopin once daily in the afternoon.    Klonopin last filled 04/22/22. Dayvigo last filled 04/09/22  Past Psychiatric Medication Trials: Trintellix - Initially effective and then was not effective when re-started Paxil- Ineffective. Helped initially. Prozac-Insomnia Lexapro- Effective and then no longer as effective Zoloft- Initially effective and then not as effective.  Viibryd Cymbalta- Ineffective. Helped initially. Effexor XR-  Effective, well tolerated. Pristiq- Increased anxiety at 150 mg daily Wellbutrin XL-Ineffective.May have increased anxiety. Amitriptyline Nortriptyline-Caused irritability, constipation Auvelity- ineffective Abilify- Helpful but caused severe insomnia. Rexulti-Helpful but caused insomnia. Vraylar Seroquel - Ineffective Risperdal Olanzapine- Ineffective Latuda- Ineffective Caplyta Klonopin- Effective Temazepam- Took during menopause Lunesta- Ineffective Ambien-Ineffective Sonata- Ineffective Dayvigo- partially effective Trazodone- Ineffective Remeron-Ineffective. Does not recall wt gain.  Adderall- Took once and had adverse effect. Concerta- Effective Metadate-Not as effective as Concerta Dexmethylphenidate- Not as effective as Concerta Azstarys- some side effects. Not as effective.  Lamictal- May have had an adverse effects. "Felt weird." Reports worsening s/s.  Lithium Gabapentin Pramipexole- "felt really weird."     AIMS    Flowsheet Row Video Visit from 01/03/2022 in [place] Total Score 0      ECT-MADRS    Flowsheet Row Video Visit from 04/29/2022 in [place] Video Visit from 02/06/2022 in [facility]  MADRS Total Score 44 23      PHQ2-9    Flowsheet Row Video Visit from 02/06/2022 in [place] Office Visit from 11/16/2020 in [facility] for [facility] [place] Visit from 04/19/2018 in [place] at [facility] Total Score 6 0 0  PHQ-9 Total Score 14 -- 2        Review of Systems:  Review of Systems  Gastrointestinal: Negative.   Musculoskeletal:  Negative for gait problem.  Neurological:  Negative for tremors.  Psychiatric/Behavioral:         Please refer to HPI    Medications:  I have reviewed the patient's current medications.  Current Outpatient Medications   Medication Sig Dispense Refill   clonazePAM (KLONOPIN) 0.5 MG tablet Take 1-1.5 tabs at bedtime and 1/2-1 tab as needed for anxiety. 60 tablet 1   doxycycline (VIBRAMYCIN) 50 MG capsule Take 50 mg by mouth daily.     escitalopram (LEXAPRO) 20 MG tablet Take 1 tablet (20 mg total) by mouth daily. 30 tablet 2   Esketamine HCl, 56 MG Dose, (SPRAVATO, 56 MG DOSE,) 28 MG/DEVICE SOPK Place 56 mg into the nose every 3 (three) days. (Patient not taking: Reported on 05/30/2022) 2 each 1   Esketamine HCl, 84 MG Dose, (SPRAVATO, 84 MG DOSE,) 28 MG/DEVICE SOPK Place 84 mg into the nose every 3 (three) days. 3 each 5   estradiol (ESTRACE) 0.1 MG/GM vaginal cream USE 1 GM VAGINALLY TWICE A WEEK AT BEDTIME 42.5 g 0   Lemborexant (DAYVIGO) 10 MG TABS Take 1/2-1 tab po QHS 30 tablet 2   MAGNESIUM PO Take by mouth.     methylphenidate 36 MG PO CR tablet Take 1 tablet (36 mg total) by mouth 2 (two) times daily with breakfast and lunch. (Patient taking differently: Take 36 mg by mouth daily.) 60 tablet 0   Multiple Vitamin (MULTIVITAMIN) tablet Take 1 tablet by mouth daily.     traZODone (DESYREL) 100 MG tablet Take 1 tablet (100 mg total) by mouth at bedtime. Take 1-2 tabs po QHS prn 180 tablet 1   valACYclovir (VALTREX) 500 MG tablet Take one twice daily for 3 days with any symptoms 30 tablet 3   No current facility-administered medications for this visit.    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   TRANSAMINASES, SERUM, ELEVATED 08/14/2009    Past Medical  History, Surgical history, Social history, and Family history were reviewed and updated as appropriate.   Please see review of systems for further details on the patient's review from today.   Objective:   Physical Exam:  LMP 12/27/2007 (Exact Date)   Physical Exam  Lab Review:     Component Value Date/Time   NA 138 04/22/2022 0932   K 4.1 04/22/2022 0932   CL 98 04/22/2022 0932   CO2 31 04/22/2022 0932   GLUCOSE 102 (H) 04/22/2022 0932   BUN 10 04/22/2022 0932   CREATININE 0.71 04/22/2022 0932   CREATININE 0.68 01/26/2020 0920   CALCIUM 10.5 04/22/2022 0932   PROT 8.3 04/22/2022 0932   ALBUMIN 5.3 (H) 04/22/2022 0932   AST 82 (H) 04/22/2022 0932   ALT 118 (H) 04/22/2022 0932   ALKPHOS 74 04/22/2022 0932   BILITOT 0.3 04/22/2022 0932   GFRNONAA >90 05/01/2014 1645   GFRAA >90 05/01/2014 1645       Component Value Date/Time   WBC 3.3 (L) 04/22/2022 0932   RBC 4.25 04/22/2022 0932   HGB 14.3 04/22/2022 0932   HGB 14.1 10/26/2014 1034   HCT 40.7 04/22/2022 0932  PLT 245.0 04/22/2022 0932   MCV 95.7 04/22/2022 0932   MCH 34.4 (H) 01/26/2020 0920   MCHC 35.1 04/22/2022 0932   RDW 13.2 04/22/2022 0932   LYMPHSABS 0.9 04/22/2022 0932   MONOABS 0.2 04/22/2022 0932   EOSABS 0.1 04/22/2022 0932   BASOSABS 0.0 04/22/2022 0932    No results found for: "POCLITH", "LITHIUM"   No results found for: "PHENYTOIN", "PHENOBARB", "VALPROATE", "CBMZ"   .res Assessment: Plan:    There are no diagnoses linked to this encounter.   Please see After Visit Summary for patient specific instructions.  Future Appointments  Date Time Provider [place]  06/03/2022 11:30 AM CP-NURSE [facility] None  06/05/2022  1:00 PM Cottle, Billey Co., MD CP-CP None  06/05/2022  1:00 PM CP-NURSE CP-CP None  08/18/2022  2:35 PM Megan Salon, MD [facility]    No orders of the defined types were placed in this encounter.   -------------------------------

## 2022-06-05 ENCOUNTER — Encounter: Payer: 59 | Admitting: Psychiatry

## 2022-06-05 ENCOUNTER — Ambulatory Visit (INDEPENDENT_AMBULATORY_CARE_PROVIDER_SITE_OTHER): Payer: 59 | Admitting: Psychiatry

## 2022-06-05 ENCOUNTER — Ambulatory Visit: Payer: 59

## 2022-06-05 VITALS — BP 126/75 | HR 59

## 2022-06-05 DIAGNOSIS — G4700 Insomnia, unspecified: Secondary | ICD-10-CM

## 2022-06-05 DIAGNOSIS — F411 Generalized anxiety disorder: Secondary | ICD-10-CM

## 2022-06-05 DIAGNOSIS — F900 Attention-deficit hyperactivity disorder, predominantly inattentive type: Secondary | ICD-10-CM | POA: Diagnosis not present

## 2022-06-05 DIAGNOSIS — F339 Major depressive disorder, recurrent, unspecified: Secondary | ICD-10-CM | POA: Diagnosis not present

## 2022-06-06 ENCOUNTER — Encounter: Payer: Self-pay | Admitting: Psychiatry

## 2022-06-06 NOTE — Addendum Note (Signed)
Addended by: Reatha Armour on: 06/06/2022 06:08 PM   Modules accepted: Level of Service

## 2022-06-06 NOTE — Progress Notes (Signed)
Heather Davila [id number] February 26, 1961 62 y.o.  Subjective:   Patient ID:  Heather Davila is a 62 y.o. (DOB 1961/04/09) female.  Chief Complaint:  Chief Complaint  Patient presents with   Follow-up   Depression   Fatigue    HPI Heather Davila presents to the office today for follow-up of treatment resistant recurrent depression, generalized anxiety disorder and insomnia.  Almost too numerous to count med failures. She is here for Spravato administration for her treatment resistant depression.  05/21/22 appt noted: Patient was administered first dose Spravato 56 mg intranasally today.  The patient experienced the typical dissociation which gradually resolved over the 2-hour period of observation.  There were no complications.  Specifically the patient did not have nausea or vomiting or headache.   Dissociation was mild. Did not experience any emotional relief from disabling depression.wants to increase Spravato to the usual dose.  She is aware insurance is not covering the costs at this time.  No SI acutely. Tolerating meds.    05/23/22 appt noted: Patient was administered Spravato 84 mg intranasally today.  The patient experienced the typical dissociation which gradually resolved over the 2-hour period of observation.  There were no complications.  Specifically the patient did not have nausea or vomiting.   Dissociation was greater than last dose and a little bothersome DT some HA. Tolerating meds.   Mood has not changed significantly thus far with Spravato.  05/27/22 appt noted: Patient was administered Spravato 84 mg intranasally today.  The patient experienced the typical dissociation which gradually resolved over the 2-hour period of observation.  There were no complications.  Specifically the patient did not have nausea or vomiting or headache. Today she has felt a little relief from the pressing heaviness of depression but a little more anxiety.  During administration of Spravato she  listens to [place] and was able to relax a little more with the feeling of dissociation that was mildly bothersome. Husband was also in on the session today and asked some questions about IV ketamine versus nasal spray ketamine.  05/29/22 appt noted: Cancelled today DT hypertension  05/30/22 appt noted: Patient was administered Spravato 84 mg intranasally today.  The patient experienced the typical dissociation which gradually resolved over the 2-hour period of observation.  There were no complications.  Specifically the patient did not have nausea or vomiting or headache. She is seeing some improvement in mood with less continuous depression and less intensity.  Hopefulness is better.   No med complaints or SE  Past Psychiatric Medication Trials: Trintellix - Initially effective and then was not effective when re-started Paxil- Ineffective. Helped initially. Prozac-Insomnia Lexapro- Effective and then no longer as effective Zoloft- Initially effective and then not as effective.  Viibryd Cymbalta- Ineffective. Helped initially. Effexor XR- Effective, well tolerated. Pristiq- Increased anxiety at 150 mg daily Wellbutrin XL-Ineffective.May have increased anxiety. Amitriptyline Nortriptyline-Caused irritability, constipation Auvelity- ineffective Abilify- Helpful but caused severe insomnia. Rexulti-Helpful but caused insomnia. Vraylar Seroquel - Ineffective Risperdal Olanzapine- Ineffective Latuda- Ineffective Caplyta Klonopin- Effective Temazepam- Took during menopause Lunesta- Ineffective Ambien-Ineffective Sonata- Ineffective Dayvigo- partially effective Trazodone- Ineffective Remeron-Ineffective. Does not recall wt gain.  Adderall- Took once and had adverse effect. Concerta- Effective Metadate-Not as effective as Concerta Dexmethylphenidate- Not as effective as Concerta Azstarys- some side effects. Not as effective.  Lamictal- May have had an adverse effects. "Felt  weird." Reports worsening s/s.  Lithium Gabapentin Pramipexole- "felt really weird."   ECT #20 with minimal response. H says it was partly  helpful.  AIMS    Flowsheet Row Video Visit from 01/03/2022 in [place] Total Score 0      ECT-MADRS    Flowsheet Row Video Visit from 04/29/2022 in [facility] Video Visit from 02/06/2022 in [facility]  MADRS Total Score 44 23      PHQ2-9    Flowsheet Row Video Visit from 02/06/2022 in [place] Office Visit from 11/16/2020 in [facility] for [facility] [place] Visit from 04/19/2018 in [place] at [facility] Total Score 6 0 0  PHQ-9 Total Score 14 -- 2        Review of Systems:  Review of Systems  Constitutional:  Positive for fatigue.  Cardiovascular:  Negative for palpitations.  Neurological:  Negative for dizziness, tremors and weakness.  Psychiatric/Behavioral:  Positive for decreased concentration and dysphoric mood.     Medications: I have reviewed the patient's current medications.  Current Outpatient Medications  Medication Sig Dispense Refill   clonazePAM (KLONOPIN) 0.5 MG tablet Take 1-1.5 tabs at bedtime and 1/2-1 tab as needed for anxiety. 60 tablet 1   doxycycline (VIBRAMYCIN) 50 MG capsule Take 50 mg by mouth daily.     escitalopram (LEXAPRO) 20 MG tablet Take 1 tablet (20 mg total) by mouth daily. 30 tablet 2   Esketamine HCl, 56 MG Dose, (SPRAVATO, 56 MG DOSE,) 28 MG/DEVICE SOPK Place 56 mg into the nose every 3 (three) days. 2 each 1   Esketamine HCl, 84 MG Dose, (SPRAVATO, 84 MG DOSE,) 28 MG/DEVICE SOPK Place 84 mg into the nose every 3 (three) days. 3 each 5   estradiol (ESTRACE) 0.1 MG/GM vaginal cream USE 1 GM VAGINALLY TWICE A WEEK AT BEDTIME 42.5 g 0   gabapentin (NEURONTIN) 100 MG capsule Take 1 capsule (100 mg total) by  mouth 3 (three) times daily as needed. Take 1 capsule three times daily 90 capsule 1   Lemborexant (DAYVIGO) 10 MG TABS Take 1/2-1 tab po QHS 30 tablet 2   MAGNESIUM PO Take by mouth.     Multiple Vitamin (MULTIVITAMIN) tablet Take 1 tablet by mouth daily.     traZODone (DESYREL) 100 MG tablet Take 1 tablet (100 mg total) by mouth at bedtime. Take 1-2 tabs po QHS prn 180 tablet 1   valACYclovir (VALTREX) 500 MG tablet Take one twice daily for 3 days with any symptoms 30 tablet 3   No current facility-administered medications for this visit.    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   TRANSAMINASES, SERUM, ELEVATED 08/14/2009    Past Medical History, Surgical history, Social history, and Family history were reviewed and updated as appropriate.   Please see review of systems for further details on the patient's review from today.   Objective:   Physical Exam:  LMP 12/27/2007 (Exact Date)   Physical Exam Constitutional:      General: She is not in acute  distress. Musculoskeletal:        General: No deformity.  Neurological:     Mental Status: She is alert and oriented to person, place, and time.     Coordination: Coordination normal.  Psychiatric:        Attention and Perception: Attention and perception normal. She does not perceive auditory or visual hallucinations.        Mood and Affect: Mood is anxious and depressed. Affect is blunt. Affect is not labile or angry.        Speech: Speech normal.        Behavior: Behavior normal. Behavior is not slowed.        Thought Content: Thought content  normal. Thought content is not paranoid or delusional. Thought content does not include homicidal or suicidal ideation. Thought content does not include suicidal plan.        Cognition and Memory: Cognition and memory normal.        Judgment: Judgment normal.     Comments: Insight intact Less intense depression     Lab Review:     Component Value Date/Time   NA 138 04/22/2022 0932   K 4.1 04/22/2022 0932   CL 98 04/22/2022 0932   CO2 31 04/22/2022 0932   GLUCOSE 102 (H) 04/22/2022 0932   BUN 10 04/22/2022 0932   CREATININE 0.71 04/22/2022 0932   CREATININE 0.68 01/26/2020 0920   CALCIUM 10.5 04/22/2022 0932   PROT 8.3 04/22/2022 0932   ALBUMIN 5.3 (H) 04/22/2022 0932   AST 82 (H) 04/22/2022 0932   ALT 118 (H) 04/22/2022 0932   ALKPHOS 74 04/22/2022 0932   BILITOT 0.3 04/22/2022 0932   GFRNONAA >90 05/01/2014 1645   GFRAA >90 05/01/2014 1645       Component Value Date/Time   WBC 3.3 (L) 04/22/2022 0932   RBC 4.25 04/22/2022 0932   HGB 14.3 04/22/2022 0932   HGB 14.1 10/26/2014 1034   HCT 40.7 04/22/2022 0932   PLT 245.0 04/22/2022 0932   MCV 95.7 04/22/2022 0932   MCH 34.4 (H) 01/26/2020 0920   MCHC 35.1 04/22/2022 0932   RDW 13.2 04/22/2022 0932   LYMPHSABS 0.9 04/22/2022 0932   MONOABS 0.2 04/22/2022 0932   EOSABS 0.1 04/22/2022 0932   BASOSABS 0.0 04/22/2022 0932    No results found for: "POCLITH", "LITHIUM"   No results found for: "PHENYTOIN", "PHENOBARB", "VALPROATE", "CBMZ"   .res Assessment: Plan:    Heather Davila was seen today for follow-up, depression and fatigue.  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  Patient was administered Spravato 84 mg intranasally today.  The patient experienced the typical  dissociation which gradually resolved over the 2-hour period of observation.  There were no complications.  Specifically the patient did not have nausea or vomiting. But mild headache.  Blood pressures remained within normal ranges at the 40-minute and 2-hour follow-up intervals.  By the time the 2-hour observation period was met the patient was alert and oriented and able to exit without assistance.  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  See nursing note for further details.Tolerated the Spravato to 27m in hopes of improvement as soon as possible.  Regarding med options.  She has obviously  been thoroughly tried on numerous medications.  The only antidepressant category that she has not taken is MAO inhibitors which are not compatible with Spravato.  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 Disc this with her previously.  She will consider.  Today she was a little less depressed and more hopeful.  Looks like starting to improve with Spravato  Tolerating meds. No med changes today  FU twice weekly Spravato for 4 weeks and reevaluate.  Lynder Parents, MD, DFAPA    Please see After Visit Summary for patient specific instructions.  Future Appointments  Date Time Provider [place]  07/01/2022  9:00 AM [name], PMHNP CP-CP None  08/18/2022  2:35 PM Megan Salon, MD [facility]    No orders of the defined types were placed in this encounter.   ------------------------------- T

## 2022-06-09 ENCOUNTER — Other Ambulatory Visit: Payer: Self-pay

## 2022-06-09 DIAGNOSIS — F339 Major depressive disorder, recurrent, unspecified: Secondary | ICD-10-CM

## 2022-06-09 MED ORDER — SPRAVATO (84 MG DOSE) 28 MG/DEVICE NA SOPK: 84.0000 mg | PACK | NASAL | 5 refills | Status: DC

## 2022-06-10 ENCOUNTER — Encounter: Payer: Self-pay | Admitting: Physician Assistant

## 2022-06-10 ENCOUNTER — Ambulatory Visit (INDEPENDENT_AMBULATORY_CARE_PROVIDER_SITE_OTHER): Payer: 59 | Admitting: Physician Assistant

## 2022-06-10 ENCOUNTER — Ambulatory Visit: Payer: 59

## 2022-06-10 VITALS — BP 109/68 | HR 73

## 2022-06-10 DIAGNOSIS — G4700 Insomnia, unspecified: Secondary | ICD-10-CM | POA: Diagnosis not present

## 2022-06-10 DIAGNOSIS — F411 Generalized anxiety disorder: Secondary | ICD-10-CM | POA: Diagnosis not present

## 2022-06-10 DIAGNOSIS — F339 Major depressive disorder, recurrent, unspecified: Secondary | ICD-10-CM | POA: Diagnosis not present

## 2022-06-10 DIAGNOSIS — F900 Attention-deficit hyperactivity disorder, predominantly inattentive type: Secondary | ICD-10-CM

## 2022-06-10 NOTE — Progress Notes (Addendum)
[facility]  Patient ID: Heather Davila,  MRN: [id number]  PCP: Heather Post, MD  Date of Evaluation: 06/10/2022 Time spent:40 minutes  Chief Complaint:  Chief Complaint   Depression; Other    HISTORY/CURRENT STATUS: HPI For 7th Spravato treatment  [name] feels that she's responding to the Spravato treatments. Feels more hopeful, less down, has a little more motivation. Doesn't feel 'heavy' like she did. Not having any SE from the treatments now. Initially she had a mild h/a when started tx a few weeks ago. Feels that she's tolerating it.  Energy and motivation are better.  No extreme sadness now, although it still occurs some.   Sleeps well most of the time. ADLs and personal hygiene are good for her.  Denies any changes in concentration, making decisions, or remembering things.  Appetite has not changed.  Weight is stable.  Anxiety is controlled.  Denies suicidal or homicidal thoughts.  Patient denies increased energy with decreased need for sleep, increased talkativeness, racing thoughts, impulsivity or risky behaviors, increased spending, increased libido, grandiosity, increased irritability or anger, paranoia, or hallucinations.  Review of Systems  Constitutional: Negative.   HENT: Negative.    Eyes: Negative.   Respiratory: Negative.    Cardiovascular: Negative.   Gastrointestinal: Negative.   Genitourinary: Negative.   Musculoskeletal: Negative.   Skin: Negative.   Neurological: Negative.   Endo/Heme/Allergies: Negative.   Psychiatric/Behavioral:         See HPI   Individual Medical History/ Review of Systems: Changes? :No   Past Psychiatric Medication Trials: Trintellix - Initially effective and then was not effective when re-started Paxil- Ineffective. Helped initially. Prozac-Insomnia Lexapro- Effective and then no longer as effective Zoloft- Initially effective and then not as effective.  Viibryd Cymbalta- Ineffective. Helped  initially. Effexor XR- Effective, well tolerated. Pristiq- Increased anxiety at 150 mg daily Wellbutrin XL-Ineffective.May have increased anxiety. Amitriptyline Nortriptyline-Caused irritability, constipation Auvelity- ineffective Abilify- Helpful but caused severe insomnia. Rexulti-Helpful but caused insomnia. Vraylar Seroquel - Ineffective Risperdal Olanzapine- Ineffective Latuda- Ineffective Caplyta Klonopin- Effective Temazepam- Took during menopause Lunesta- Ineffective Ambien-Ineffective Sonata- Ineffective Dayvigo- partially effective Trazodone- Ineffective Remeron-Ineffective. Does not recall wt gain.  Adderall- Took once and had adverse effect. Concerta- Effective Metadate-Not as effective as Concerta Dexmethylphenidate- Not as effective as Concerta Azstarys- some side effects. Not as effective.  Lamictal- May have had an adverse effects. "Felt weird." Reports worsening s/s.  Lithium Gabapentin Pramipexole- "felt really weird."   Allergies: Ciprofloxacin and Oxycodone-acetaminophen  Current Medications:  Current Outpatient Medications:    clonazePAM (KLONOPIN) 0.5 MG tablet, Take 1-1.5 tabs at bedtime and 1/2-1 tab as needed for anxiety., Disp: 60 tablet, Rfl: 1   escitalopram (LEXAPRO) 20 MG tablet, Take 1 tablet (20 mg total) by mouth daily., Disp: 30 tablet, Rfl: 2   Esketamine HCl, 84 MG Dose, (SPRAVATO, 84 MG DOSE,) 28 MG/DEVICE SOPK, Place 84 mg into the nose every 3 (three) days., Disp: 3 each, Rfl: 5   estradiol (ESTRACE) 0.1 MG/GM vaginal cream, USE 1 GM VAGINALLY TWICE A WEEK AT BEDTIME, Disp: 42.5 g, Rfl: 0   gabapentin (NEURONTIN) 100 MG capsule, Take 1 capsule (100 mg total) by mouth 3 (three) times daily as needed. Take 1 capsule three times daily, Disp: 90 capsule, Rfl: 1   Lemborexant (DAYVIGO) 10 MG TABS, Take 1/2-1 tab po QHS, Disp: 30 tablet, Rfl: 2   Multiple Vitamin (MULTIVITAMIN) tablet, Take 1 tablet by mouth daily., Disp: , Rfl:     traZODone (DESYREL) 100  MG tablet, Take 1 tablet (100 mg total) by mouth at bedtime. Take 1-2 tabs po QHS prn, Disp: 180 tablet, Rfl: 1   valACYclovir (VALTREX) 500 MG tablet, Take one twice daily for 3 days with any symptoms, Disp: 30 tablet, Rfl: 3   doxycycline (VIBRAMYCIN) 50 MG capsule, Take 50 mg by mouth daily., Disp: , Rfl:    Esketamine HCl, 56 MG Dose, (SPRAVATO, 56 MG DOSE,) 28 MG/DEVICE SOPK, Place 56 mg into the nose every 3 (three) days. (Patient not taking: Reported on 06/10/2022), Disp: 2 each, Rfl: 1   MAGNESIUM PO, Take by mouth., Disp: , Rfl:  Medication Side Effects: none  Family Medical/ Social History: Changes? No  MENTAL HEALTH EXAM:  Last menstrual period 12/27/2007.There is no height or weight on file to calculate BMI.  General Appearance: Casual and Well Groomed  Eye Contact:  Good  Speech:  Clear and Coherent and Normal Rate  Volume:  Normal  Mood:  Euthymic  Affect:  Congruent  Thought Process:  Goal Directed and Descriptions of Associations: Circumstantial  Orientation:  Full (Time, Place, and Person)  Thought Content: Logical   Suicidal Thoughts:  No  Homicidal Thoughts:  No  Memory:  WNL  Judgement:  Good  Insight:  Good  Psychomotor Activity:  Normal  Concentration:  Concentration: Good  Recall:  Good  Fund of Knowledge: Good  Language: Good  Assets:  Desire for Improvement Financial Resources/Insurance Housing Transportation  ADL's:  Intact  Cognition: WNL  Prognosis:  Good   Patient was administered Spravato 84 mg intranasally today.  The patient experienced the typical dissociation which gradually resolved over the 2-hour period of observation.  There were no complications.  Specifically the patient did not have nausea or vomiting or headache.  Blood pressures remained within normal ranges at the 40-minute and 2-hour follow-up intervals.  By the time the 2-hour observation period was met the patient was alert and oriented and able to exit  without assistance.  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  See nursing note for further details.  DIAGNOSES:    ICD-10-CM   1. 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 Generalized anxiety disorder  F41.1     3. Attention deficit hyperactivity disorder (ADHD), predominantly inattentive type  F90.0     4. Insomnia, unspecified type  G47.00      Receiving Psychotherapy: Yes  with Emily Filbert, [facility]  RECOMMENDATIONS:  PDMP reviewed.  Spravato filled 06/09/2022.  Dayvigo filled 06/03/2022.  I provided 40 minutes of face to face time during this encounter, including time spent before and after the visit in records review, medical decision making, counseling pertinent to today's visit, and charting.   She's responding to the Spravato, will continue txment per protocol.  Continue Klonopin 0.5 mg, 1-1.5 tabs nightly and 1/2-1 p.o. as needed. Continue Lexapro 20 mg, 1 p.o. daily. Continue Spravato 84 mg, twice a week. Continue gabapentin 100 mg, 1 p.o. 3 times daily. Continue Dayvigo 10 mg, 1/2-1 p.o. nightly. Continue trazodone 100 mg, 1 p.o. nightly. Continue therapy with Emily Filbert [facility]. Return in 3 days.  Donnal Moat, PA-C

## 2022-06-11 ENCOUNTER — Encounter: Payer: Self-pay | Admitting: Physician Assistant

## 2022-06-12 ENCOUNTER — Encounter: Payer: Self-pay | Admitting: Physician Assistant

## 2022-06-12 ENCOUNTER — Ambulatory Visit (INDEPENDENT_AMBULATORY_CARE_PROVIDER_SITE_OTHER): Payer: 59 | Admitting: Physician Assistant

## 2022-06-12 DIAGNOSIS — G4700 Insomnia, unspecified: Secondary | ICD-10-CM | POA: Diagnosis not present

## 2022-06-12 DIAGNOSIS — F411 Generalized anxiety disorder: Secondary | ICD-10-CM | POA: Diagnosis not present

## 2022-06-12 DIAGNOSIS — F339 Major depressive disorder, recurrent, unspecified: Secondary | ICD-10-CM | POA: Diagnosis not present

## 2022-06-12 NOTE — Progress Notes (Unsigned)
[facility]  Patient ID: Heather Davila,  MRN: [id number]  PCP: Eulas Post, MD  Date of Evaluation: 06/12/2022 Time spent:40 minutes  Chief Complaint:  Chief Complaint   Depression; Other    HISTORY/CURRENT STATUS: HPI For 8th Spravato treatment  Heather Davila is responding to the Spravato and very thankful it's helping. Has more energy and motivation.  She does things with a couple of friends and is involved in [place] study, which she did prior to starting the Spravato but it was much more difficult to be motivated to do those things.  She feels much more hopeful.   Did not sleep well last night but that does sometimes happen with her.  ADLs and personal hygiene are good for her.  Denies any changes in concentration, making decisions, or remembering things.  Appetite has not changed.  Weight is stable.  Anxiety is controlled.  Denies suicidal or homicidal thoughts.  Patient denies increased energy with decreased need for sleep, increased talkativeness, racing thoughts, impulsivity or risky behaviors, increased spending, increased libido, grandiosity, increased irritability or anger, paranoia, or hallucinations.  Review of Systems  Constitutional: Negative.   HENT: Negative.    Eyes: Negative.   Respiratory: Negative.    Cardiovascular: Negative.   Gastrointestinal: Negative.   Genitourinary: Negative.   Musculoskeletal: Negative.   Skin: Negative.   Neurological: Negative.   Endo/Heme/Allergies: Negative.   Psychiatric/Behavioral:         See HPI   Individual Medical History/ Review of Systems: Changes? :No   Past Psychiatric Medication Trials: Trintellix - Initially effective and then was not effective when re-started Paxil- Ineffective. Helped initially. Prozac-Insomnia Lexapro- Effective and then no longer as effective Zoloft- Initially effective and then not as effective.  Viibryd Cymbalta- Ineffective. Helped initially. Effexor XR- Effective, well  tolerated. Pristiq- Increased anxiety at 150 mg daily Wellbutrin XL-Ineffective.May have increased anxiety. Amitriptyline Nortriptyline-Caused irritability, constipation Auvelity- ineffective Abilify- Helpful but caused severe insomnia. Rexulti-Helpful but caused insomnia. Vraylar Seroquel - Ineffective Risperdal Olanzapine- Ineffective Latuda- Ineffective Caplyta Klonopin- Effective Temazepam- Took during menopause Lunesta- Ineffective Ambien-Ineffective Sonata- Ineffective Dayvigo- partially effective Trazodone- Ineffective Remeron-Ineffective. Does not recall wt gain.  Adderall- Took once and had adverse effect. Concerta- Effective Metadate-Not as effective as Concerta Dexmethylphenidate- Not as effective as Concerta Azstarys- some side effects. Not as effective.  Lamictal- May have had an adverse effects. "Felt weird." Reports worsening s/s.  Lithium Gabapentin Pramipexole- "felt really weird."   Allergies: Ciprofloxacin and Oxycodone-acetaminophen  Current Medications:  Current Outpatient Medications:    clonazePAM (KLONOPIN) 0.5 MG tablet, Take 1-1.5 tabs at bedtime and 1/2-1 tab as needed for anxiety., Disp: 60 tablet, Rfl: 1   doxycycline (VIBRAMYCIN) 50 MG capsule, Take 50 mg by mouth daily., Disp: , Rfl:    escitalopram (LEXAPRO) 20 MG tablet, Take 1 tablet (20 mg total) by mouth daily., Disp: 30 tablet, Rfl: 2   Esketamine HCl, 84 MG Dose, (SPRAVATO, 84 MG DOSE,) 28 MG/DEVICE SOPK, Place 84 mg into the nose every 3 (three) days., Disp: 3 each, Rfl: 5   estradiol (ESTRACE) 0.1 MG/GM vaginal cream, USE 1 GM VAGINALLY TWICE A WEEK AT BEDTIME, Disp: 42.5 g, Rfl: 0   gabapentin (NEURONTIN) 100 MG capsule, Take 1 capsule (100 mg total) by mouth 3 (three) times daily as needed. Take 1 capsule three times daily, Disp: 90 capsule, Rfl: 1   Lemborexant (DAYVIGO) 10 MG TABS, Take 1/2-1 tab po QHS, Disp: 30 tablet, Rfl: 2   MAGNESIUM PO, Take by  mouth., Disp: , Rfl:     Multiple Vitamin (MULTIVITAMIN) tablet, Take 1 tablet by mouth daily., Disp: , Rfl:    traZODone (DESYREL) 100 MG tablet, Take 1 tablet (100 mg total) by mouth at bedtime. Take 1-2 tabs po QHS prn, Disp: 180 tablet, Rfl: 1   valACYclovir (VALTREX) 500 MG tablet, Take one twice daily for 3 days with any symptoms, Disp: 30 tablet, Rfl: 3 Medication Side Effects: none  Family Medical/ Social History: Changes? No  MENTAL HEALTH EXAM:  Last menstrual period 12/27/2007.There is no height or weight on file to calculate BMI.  General Appearance: Casual and Well Groomed  Eye Contact:  Good  Speech:  Clear and Coherent and Normal Rate  Volume:  Normal  Mood:  Euthymic  Affect:  Congruent  Thought Process:  Goal Directed and Descriptions of Associations: Circumstantial  Orientation:  Full (Time, Place, and Person)  Thought Content: Logical   Suicidal Thoughts:  No  Homicidal Thoughts:  No  Memory:  WNL  Judgement:  Good  Insight:  Good  Psychomotor Activity:  Normal  Concentration:  Concentration: Good  Recall:  Good  Fund of Knowledge: Good  Language: Good  Assets:  Communication Skills Desire for Improvement Financial Resources/Insurance Housing Transportation  ADL's:  Intact  Cognition: WNL  Prognosis:  Good   Patient was administered Spravato 84 mg intranasally today.  The patient experienced the typical dissociation which gradually resolved over the 2-hour period of observation.  There were no complications.  Specifically the patient did not have nausea or vomiting or headache.  Blood pressures remained within normal ranges at the 40-minute and 2-hour follow-up intervals.  By the time the 2-hour observation period was met the patient was alert and oriented and able to exit without assistance.  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  See nursing note for further details.  DIAGNOSES:  No  diagnosis found.  Receiving Psychotherapy: Yes  with Emily Filbert, [facility]  RECOMMENDATIONS:  PDMP reviewed.  Spravato filled 06/09/2022.  Dayvigo filled 06/03/2022.  I provided 40 minutes of face to face time during this encounter, including time spent before and after the visit in records review, medical decision making, counseling pertinent to today's visit, and charting.   She's responding to the Spravato, will continue txment per protocol.  Continue Klonopin 0.5 mg, 1-1.5 tabs nightly and 1/2-1 p.o. as needed. Continue Lexapro 20 mg, 1 p.o. daily. Continue Spravato 84 mg, twice a week. Continue gabapentin 100 mg, 1 p.o. 3 times daily. Continue Dayvigo 10 mg, 1/2-1 p.o. nightly. Continue trazodone 100 mg, 1 p.o. nightly. Continue therapy with Emily Filbert [facility]. Return in 3 days.  Donnal Moat, PA-C

## 2022-06-16 ENCOUNTER — Encounter: Payer: Self-pay | Admitting: Psychiatry

## 2022-06-16 ENCOUNTER — Telehealth (INDEPENDENT_AMBULATORY_CARE_PROVIDER_SITE_OTHER): Payer: 59 | Admitting: Psychiatry

## 2022-06-16 DIAGNOSIS — G4700 Insomnia, unspecified: Secondary | ICD-10-CM

## 2022-06-16 DIAGNOSIS — F900 Attention-deficit hyperactivity disorder, predominantly inattentive type: Secondary | ICD-10-CM

## 2022-06-16 DIAGNOSIS — F419 Anxiety disorder, unspecified: Secondary | ICD-10-CM

## 2022-06-16 MED ORDER — GABAPENTIN 300 MG PO CAPS: ORAL_CAPSULE | ORAL | 1 refills | Status: DC

## 2022-06-16 MED ORDER — METHYLPHENIDATE HCL ER (OSM) 36 MG PO TBCR: 36.0000 mg | EXTENDED_RELEASE_TABLET | Freq: Every day | ORAL | 0 refills | Status: DC | PRN

## 2022-06-16 NOTE — Progress Notes (Signed)
Heather Davila [id number] 10-11-1960 62 y.o.  Virtual Visit via Video Note  I connected with pt @ on 06/16/22 at 11:00 AM EST by a video enabled telemedicine application and verified that I am speaking with the correct person using two identifiers.   I discussed the limitations of evaluation and management by telemedicine and the availability of in person appointments. The patient expressed understanding and agreed to proceed.  I discussed the assessment and treatment plan with the patient. The patient was provided an opportunity to ask questions and all were answered. The patient agreed with the plan and demonstrated an understanding of the instructions.   The patient was advised to call back or seek an in-person evaluation if the symptoms worsen or if the condition fails to improve as anticipated.  I provided 25 minutes of non-face-to-face time during this encounter.  The patient was located at home.  The provider was located at home.   [name], PMHNP   Subjective:   Patient ID:  Heather Davila is a 62 y.o. (DOB 12-23-1960) female.  Chief Complaint:  Chief Complaint  Patient presents with   Insomnia    HPI Heather Davila presents emergently for worsening insomnia. She reports, "I'm not sleeping and I cannot function." She reports meds (Dayvigo and Trazodone) "will put me to sleep" and then wakes up about 2 hours later and does not return to sleep other than possibly "dozing off" on occasion. She reports that this has been occurring for about a week. Denies RLS. She notices a hand tremor.   She reports that depression is "ok. I do think the Spravato is helping. I'm still not where I need to be." She reports that she continues to have a "gloomy feeling... don't have the joy I used to feel or the happiness." She reports that she is "doing more" now, to include taking her dog for a walk. She went to a Bible study this morning. Some social interaction. She went shopping with a friend  on Friday. She has been going to church, reading her Bible, and praying. She has started cooking and baking again. Some increase in appetite. Energy and motivation have improved and remain "on the low end." She is trying to get out and walk more.   She reports, "I have anxiety." She questions if anxiety is causing her hands to shake and this has occurred in the past. Denies panic attacks. Denies anxious thoughts at night.   Denies SI.   She reports that she took Concerta one day last week and it was helpful for her concentration. She reports that her memory is gradually improving.   She saw Marco Collie, PhD last week and felt that this was a good fit.   She has been taking Gabapentin 100 mg TID.   Dayvigo last filled 06/03/22.  Klonopin last filled 05/29/22.  Review of Systems:  Review of Systems  Cardiovascular:  Negative for palpitations.  Musculoskeletal:  Negative for gait problem.  Neurological:  Positive for tremors.  Psychiatric/Behavioral:         Please refer to HPI    Medications: I have reviewed the patient's current medications.  Current Outpatient Medications  Medication Sig Dispense Refill   clonazePAM (KLONOPIN) 0.5 MG tablet Take 1-1.5 tabs at bedtime and 1/2-1 tab as needed for anxiety. 60 tablet 1   escitalopram (LEXAPRO) 20 MG tablet Take 1 tablet (20 mg total) by mouth daily. 30 tablet 2   Esketamine HCl, 84 MG Dose, (SPRAVATO, 84 MG DOSE,)  28 MG/DEVICE SOPK Place 84 mg into the nose every 3 (three) days. 3 each 5   estradiol (ESTRACE) 0.1 MG/GM vaginal cream USE 1 GM VAGINALLY TWICE A WEEK AT BEDTIME 42.5 g 0   gabapentin (NEURONTIN) 100 MG capsule Take 1 capsule (100 mg total) by mouth 3 (three) times daily as needed. Take 1 capsule three times daily 90 capsule 1   Lemborexant (DAYVIGO) 10 MG TABS Take 1/2-1 tab po QHS 30 tablet 2   MAGNESIUM PO Take by mouth.     Multiple Vitamin (MULTIVITAMIN) tablet Take 1 tablet by mouth daily.     traZODone (DESYREL) 100 MG  tablet Take 1 tablet (100 mg total) by mouth at bedtime. Take 1-2 tabs po QHS prn 180 tablet 1   valACYclovir (VALTREX) 500 MG tablet Take one twice daily for 3 days with any symptoms 30 tablet 3   doxycycline (VIBRAMYCIN) 50 MG capsule Take 50 mg by mouth daily.     gabapentin (NEURONTIN) 300 MG capsule Take 1-2 capsules at bedtime 60 capsule 1   methylphenidate 36 MG PO CR tablet Take 1 tablet (36 mg total) by mouth daily as needed. Do not take on Spravato treatment days. 30 tablet 0   No current facility-administered medications for this visit.    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   TRANSAMINASES, SERUM, ELEVATED 08/14/2009    Family History  Problem Relation Age of Onset   Cancer Mother 54       colon cancer   Hypertension Mother    Colon cancer Mother 45       colon rescection   Colon polyps Mother    Hypertension Father    Heart disease Father 27       CABG62   Stroke Father 59   Heart disease Sister        atrial fibrillation   Heart attack Sister 30       no stents   Depression Sister    Heart failure Paternal Aunt    Diabetes Paternal Uncle    Heart disease Paternal Grandfather    Esophageal cancer Neg Hx    Rectal cancer Neg Hx    Stomach cancer Neg Hx     Social History   Socioeconomic History   Marital status: Married    Spouse name: [name]   Number of children: 3   Years of education: Master's   Highest education level: Not on file  Occupational History   Occupation: Optician, dispensing: [employer]  Tobacco Use   Smoking status: Former    Packs/day:  0.50    Years: 5.00    Total pack years: 2.50    Types: Cigarettes    Quit date: 07/10/1987    Years since quitting: 34.9   Smokeless tobacco: Never  Vaping Use   Vaping Use: Never used  Substance and Sexual Activity   Alcohol use: No   Drug use: No   Sexual activity: Yes    Partners: Male  Birth control/protection: Post-menopausal, Other-see comments    Comment: vasectomy  Other Topics Concern   Not on file  Social History Narrative   Patient is married Tax inspector) and lives at home with her husband and two children.   Patient has three children.   Patient works at [employer].   Patient has a Master's degree.   Patient is left-handed.   Patient drinks 6 cups of coffee daily.   Social Determinants of Health   Financial Resource Strain: Not on file  Food Insecurity: Not on file  Transportation Needs: Not on file  Physical Activity: Not on file  Stress: Not on file  Social Connections: Not on file  Intimate Partner Violence: Not on file    Past Medical History, Surgical history, Social history, and Family history were reviewed and updated as appropriate.   Please see review of systems for further details on the patient's review from today.   Objective:   Physical Exam:  Wt 89 lb (40.4 kg)   LMP 12/27/2007 (Exact Date)   BMI 17.47 kg/m   Physical Exam Neurological:     Mental Status: She is alert and oriented to person, place, and time.     Cranial Nerves: No dysarthria.  Psychiatric:        Attention and Perception: Attention and perception normal.        Speech: Speech normal.        Behavior: Behavior is cooperative.        Thought Content: Thought content normal. Thought content is not paranoid or delusional. Thought content does not include homicidal or suicidal ideation. Thought content does not include homicidal or suicidal plan.        Cognition and Memory: Cognition and memory normal.        Judgment: Judgment normal.     Comments: Insight  intact Mood presents as overall less anxious and less depressed compared to most recent exam.      Lab Review:     Component Value Date/Time   NA 138 04/22/2022 0932   K 4.1 04/22/2022 0932   CL 98 04/22/2022 0932   CO2 31 04/22/2022 0932   GLUCOSE 102 (H) 04/22/2022 0932   BUN 10 04/22/2022 0932   CREATININE 0.71 04/22/2022 0932   CREATININE 0.68 01/26/2020 0920   CALCIUM 10.5 04/22/2022 0932   PROT 8.3 04/22/2022 0932   ALBUMIN 5.3 (H) 04/22/2022 0932   AST 82 (H) 04/22/2022 0932   ALT 118 (H) 04/22/2022 0932   ALKPHOS 74 04/22/2022 0932   BILITOT 0.3 04/22/2022 0932   GFRNONAA >90 05/01/2014 1645   GFRAA >90 05/01/2014 1645       Component Value Date/Time   WBC 3.3 (L) 04/22/2022 0932   RBC 4.25 04/22/2022 0932   HGB 14.3 04/22/2022 0932   HGB 14.1 10/26/2014 1034   HCT 40.7 04/22/2022 0932   PLT 245.0 04/22/2022 0932   MCV 95.7 04/22/2022 0932   MCH 34.4 (H) 01/26/2020 0920   MCHC 35.1 04/22/2022 0932   RDW 13.2 04/22/2022 0932   LYMPHSABS 0.9 04/22/2022 0932   MONOABS 0.2 04/22/2022 0932   EOSABS 0.1 04/22/2022 0932   BASOSABS 0.0 04/22/2022 0932    No results found for: "POCLITH", "LITHIUM"   No results found for: "PHENYTOIN", "PHENOBARB", "VALPROATE", "CBMZ"   .res Assessment: Plan:    Pt seen for 25 minutes and time spent discussing acute exacerbation of insomnia. Discussed that in the past higher doses of Gabapentin (ie, 300-600 mg po  QHS) have been most effective for her insomnia and that she would use 100 mg capsules for middle of the night awakenings. Recommended increase in Gabapentin at bedtime to improve insomnia. Pt is in agreement with this plan.  Will start Gabapentin 300 mg 1-2 capsules at bedtime. Will continue Gabapentin 100 mg prn, however recommend using Gabapentin 100 mg prn for middle of the night awakenings and not during the day since this has not been helpful for her anxiety.  Will re-start Concerta 36 mg as needed (not on days of  Spravato treatments) since she reports that Concerta has been helpful for her concentration and her BP has remained well controlled. Discussed stopping Concerta if increase in BP occurs.  Continue Dayvigo 10 mg po QHS and Trazodone for insomnia since she reports that these medications have been helpful for sleep initiation.  Continue Lexapro 20 mg po qd for anxiety and depression.  Recommend continuing Spravato treatments since this has been helpful for depression.  Pt to follow-up with this provider in 3-4 weeks or sooner if clinically indicated.  Recommend continuing psychotherapy.  Patient advised to contact office with any questions, adverse effects, or acute worsening in signs and symptoms.   Ellerie was seen today for insomnia.  Diagnoses and all orders for this visit:  Anxiety -     gabapentin (NEURONTIN) 300 MG capsule; Take 1-2 capsules at bedtime  Insomnia, unspecified type -     gabapentin (NEURONTIN) 300 MG capsule; Take 1-2 capsules at bedtime  Attention deficit hyperactivity disorder (ADHD), predominantly inattentive type -     methylphenidate 36 MG PO CR tablet; Take 1 tablet (36 mg total) by mouth daily as needed. Do not take on Spravato treatment days.     Please see After Visit Summary for patient specific instructions.  Future Appointments  Date Time Provider [place]  06/17/2022  2:00 PM Cottle, Billey Co., MD CP-CP None  06/17/2022  2:00 PM CP-NURSE CP-CP None  06/19/2022  8:00 AM CP-NURSE CP-CP None  06/19/2022  9:00 AM Cottle, Billey Co., MD CP-CP None  07/01/2022  9:00 AM [name], PMHNP CP-CP None  08/18/2022  2:35 PM Megan Salon, MD [facility]    No orders of the defined types were placed in this encounter.     -------------------------------

## 2022-06-17 ENCOUNTER — Ambulatory Visit (INDEPENDENT_AMBULATORY_CARE_PROVIDER_SITE_OTHER): Payer: 59 | Admitting: Psychiatry

## 2022-06-17 ENCOUNTER — Ambulatory Visit: Payer: 59

## 2022-06-17 ENCOUNTER — Telehealth: Payer: Self-pay | Admitting: Psychiatry

## 2022-06-17 ENCOUNTER — Ambulatory Visit: Payer: 59 | Admitting: Psychiatry

## 2022-06-17 ENCOUNTER — Encounter: Payer: Self-pay | Admitting: Psychiatry

## 2022-06-17 VITALS — BP 98/59 | HR 71

## 2022-06-17 DIAGNOSIS — F900 Attention-deficit hyperactivity disorder, predominantly inattentive type: Secondary | ICD-10-CM

## 2022-06-17 DIAGNOSIS — F339 Major depressive disorder, recurrent, unspecified: Secondary | ICD-10-CM | POA: Diagnosis not present

## 2022-06-17 DIAGNOSIS — G4700 Insomnia, unspecified: Secondary | ICD-10-CM

## 2022-06-17 DIAGNOSIS — F411 Generalized anxiety disorder: Secondary | ICD-10-CM | POA: Diagnosis not present

## 2022-06-17 NOTE — Progress Notes (Signed)
Heather Davila [id number] 05/17/1960 62 y.o.  Subjective:   Patient ID:  Heather Davila is a 62 y.o. (DOB 10-31-1960) female.  Chief Complaint:  Chief Complaint  Patient presents with   Follow-up   Depression   Anxiety   Sleeping Problem   Fatigue    HPI Heather Davila presents to the office today for follow-up of treatment resistant recurrent depression, generalized anxiety disorder and insomnia.  Almost too numerous to count med failures. She is here for Spravato administration for her treatment resistant depression.  05/21/22 appt noted: Patient was administered first dose Spravato 56 mg intranasally today.  The patient experienced the typical dissociation which gradually resolved over the 2-hour period of observation.  There were no complications.  Specifically the patient did not have nausea or vomiting or headache.   Dissociation was mild. Did not experience any emotional relief from disabling depression.wants to increase Spravato to the usual dose.  She is aware insurance is not covering the costs at this time.  No SI acutely. Tolerating meds.    05/23/22 appt noted: Patient was administered Spravato 84 mg intranasally today.  The patient experienced the typical dissociation which gradually resolved over the 2-hour period of observation.  There were no complications.  Specifically the patient did not have nausea or vomiting.   Dissociation was greater than last dose and a little bothersome DT some HA. Tolerating meds.   Mood has not changed significantly thus far with Spravato.  05/27/22 appt noted: Patient was administered Spravato 84 mg intranasally today.  The patient experienced the typical dissociation which gradually resolved over the 2-hour period of observation.  There were no complications.  Specifically the patient did not have nausea or vomiting or headache. Today she has felt a little relief from the pressing heaviness of depression but a little more anxiety.  During  administration of Spravato she listens to Organ and was able to relax a little more with the feeling of dissociation that was mildly bothersome. Husband was also in on the session today and asked some questions about IV ketamine versus nasal spray ketamine.  05/29/22 appt noted: Cancelled today DT hypertension  05/30/22 appt noted: Patient was administered Spravato 84 mg intranasally today.  The patient experienced the typical dissociation which gradually resolved over the 2-hour period of observation.  There were no complications.  Specifically the patient did not have nausea or vomiting or headache. She is seeing some improvement in mood with less continuous depression and less intensity.  Hopefulness is better.   No med complaints or SE  06/05/22 appt noted: Patient was administered Spravato 84 mg intranasally today.  The patient experienced the typical dissociation which gradually resolved over the 2-hour period of observation.  There were no complications.  Specifically the patient did not have nausea or vomiting or headache.  Specifically HA is less of a problem with Spravato. No SE with meds. Depression is improving with less intensity.  Intermittent anxiety easily.  More able to enjoy things.  No new concerns.  Past Psychiatric Medication Trials: Trintellix - Initially effective and then was not effective when re-started Paxil- Ineffective. Helped initially. Prozac-Insomnia Lexapro- Effective and then no longer as effective Zoloft- Initially effective and then not as effective.  Viibryd Cymbalta- Ineffective. Helped initially. Effexor XR- Effective, well tolerated. Pristiq- Increased anxiety at 150 mg daily Wellbutrin XL-Ineffective.May have increased anxiety. Amitriptyline Nortriptyline-Caused irritability, constipation Auvelity- ineffective Abilify- Helpful but caused severe insomnia. Rexulti-Helpful but caused insomnia. Vraylar Seroquel -  Ineffective Risperdal Olanzapine-  Ineffective Latuda- Ineffective Caplyta Klonopin- Effective Temazepam- Took during menopause Lunesta- Ineffective Ambien-Ineffective Sonata- Ineffective Dayvigo- partially effective Trazodone- Ineffective Remeron-Ineffective. Does not recall wt gain.  Adderall- Took once and had adverse effect. Concerta- Effective Metadate-Not as effective as Concerta Dexmethylphenidate- Not as effective as Concerta Azstarys- some side effects. Not as effective.  Lamictal- May have had an adverse effects. "Felt weird." Reports worsening s/s.  Lithium Gabapentin Pramipexole- "felt really weird."   ECT #20 with minimal response. H says it was partly helpful.  AIMS    Flowsheet Row Video Visit from 01/03/2022 in [place] Total Score 0      ECT-MADRS    Flowsheet Row Video Visit from 04/29/2022 in [facility] Video Visit from 02/06/2022 in [facility]  MADRS Total Score 44 23      PHQ2-9    Flowsheet Row Video Visit from 02/06/2022 in [place] Office Visit from 11/16/2020 in [facility] [place] for [facility] Visit from 04/19/2018 in [place] at [facility] Total Score 6 0 0  PHQ-9 Total Score 14 -- 2        Review of Systems:  Review of Systems  Constitutional:  Positive for fatigue.  Cardiovascular:  Negative for palpitations.  Neurological:  Negative for dizziness and tremors.  Psychiatric/Behavioral:  Positive for decreased concentration and dysphoric mood. The patient is nervous/anxious.     Medications: I have reviewed the patient's current medications.  Current Outpatient Medications  Medication Sig Dispense Refill   clonazePAM (KLONOPIN) 0.5 MG tablet Take 1-1.5 tabs at bedtime and 1/2-1 tab as needed for anxiety. 60 tablet 1   doxycycline (VIBRAMYCIN)  50 MG capsule Take 50 mg by mouth daily.     escitalopram (LEXAPRO) 20 MG tablet Take 1 tablet (20 mg total) by mouth daily. 30 tablet 2   Esketamine HCl, 84 MG Dose, (SPRAVATO, 84 MG DOSE,) 28 MG/DEVICE SOPK Place 84 mg into the nose every 3 (three) days. 3 each 5   estradiol (ESTRACE) 0.1 MG/GM vaginal cream USE 1 GM VAGINALLY TWICE A WEEK AT BEDTIME 42.5 g 0   gabapentin (NEURONTIN) 100 MG capsule Take 1 capsule (100 mg total) by mouth 3 (three) times daily as needed. Take 1 capsule three times daily 90 capsule 1   gabapentin (NEURONTIN) 300 MG capsule Take 1-2 capsules at bedtime 60 capsule 1   Lemborexant (DAYVIGO) 10 MG TABS Take 1/2-1 tab po QHS 30 tablet 2   MAGNESIUM PO Take by mouth.     methylphenidate 36 MG PO CR tablet Take 1 tablet (36 mg total) by mouth daily as needed. Do not take on Spravato treatment days. 30 tablet 0   Multiple Vitamin (MULTIVITAMIN) tablet Take 1 tablet by mouth daily.     traZODone (DESYREL) 100 MG tablet Take 1 tablet (100 mg total) by mouth at bedtime. Take 1-2 tabs po QHS prn 180 tablet 1   valACYclovir (VALTREX) 500 MG tablet Take one twice daily for 3 days with any symptoms 30 tablet 3   No current facility-administered medications for this visit.    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   TRANSAMINASES, SERUM, ELEVATED 08/14/2009    Past Medical History, Surgical history, Social history, and Family history were reviewed and updated as appropriate.   Please see review of systems for further details on the patient's review from today.   Objective:   Physical  Exam:  LMP 12/27/2007 (Exact Date)   Physical Exam Constitutional:      General: She is not in acute distress. Musculoskeletal:        General: No deformity.  Neurological:     Mental Status: She is alert and oriented to person, place, and time.     Coordination: Coordination normal.  Psychiatric:        Attention and Perception: Attention and perception normal. She does not perceive auditory or visual hallucinations.        Mood and Affect: Mood is anxious and depressed. Affect is blunt. Affect is not labile or angry.        Speech: Speech normal.        Behavior: Behavior normal. Behavior is not slowed.        Thought Content: Thought content normal. Thought content is not paranoid or delusional. Thought content does not include homicidal or suicidal ideation. Thought content does not include suicidal plan.        Cognition and Memory: Cognition and memory normal.        Judgment: Judgment normal.     Comments: Insight intact Less intense depression and less blunted     Lab Review:     Component Value Date/Time   NA 138 04/22/2022 0932   K 4.1 04/22/2022 0932   CL 98 04/22/2022 0932   CO2 31 04/22/2022 0932   GLUCOSE 102 (H) 04/22/2022 0932   BUN 10 04/22/2022 0932   CREATININE 0.71 04/22/2022 0932   CREATININE 0.68 01/26/2020 0920   CALCIUM 10.5 04/22/2022 0932   PROT 8.3 04/22/2022 0932   ALBUMIN 5.3 (H) 04/22/2022 0932   AST 82 (H) 04/22/2022 0932   ALT 118 (H) 04/22/2022 0932   ALKPHOS 74 04/22/2022 0932   BILITOT 0.3 04/22/2022 0932   GFRNONAA >90 05/01/2014 1645   GFRAA >90 05/01/2014 1645       Component Value Date/Time   WBC 3.3 (L) 04/22/2022 0932   RBC 4.25 04/22/2022 0932   HGB 14.3 04/22/2022 0932   HGB 14.1 10/26/2014 1034   HCT 40.7 04/22/2022 0932   PLT 245.0 04/22/2022 0932   MCV 95.7 04/22/2022 0932   MCH 34.4 (H) 01/26/2020 0920   MCHC 35.1 04/22/2022 0932   RDW 13.2 04/22/2022 0932   LYMPHSABS 0.9 04/22/2022 0932   MONOABS 0.2 04/22/2022  0932   EOSABS 0.1 04/22/2022 0932   BASOSABS 0.0 04/22/2022 0932    No results found for: "POCLITH", "LITHIUM"   No results found for: "PHENYTOIN", "PHENOBARB", "VALPROATE", "CBMZ"   .res Assessment: Plan:    Heather Davila was seen today for follow-up, depression, anxiety, sleeping problem and fatigue.  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  Patient was administered Spravato 84 mg intranasally today.  The patient experienced the  typical dissociation which gradually resolved over the 2-hour period of observation.  There were no complications.  Specifically the patient did not have nausea or vomiting. But mild headache.  Blood pressures remained within normal ranges at the 40-minute and 2-hour follow-up intervals.  By the time the 2-hour observation period was met the patient was alert and oriented and able to exit without assistance.  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  See nursing note for further details.Tolerated the Spravato to 55m in hopes of improvement as soon as possible.  Regarding med options.  She has obviously been thoroughly tried on numerous medications.  The only antidepressant category that she has not taken is MAO inhibitors which are not compatible with Spravato.  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 Disc this with her previously.  She will consider.  Today she was a little less depressed and more hopeful.  Looks like starting to improve with Spravato.  Wants to continue the tx  plan  Tolerating meds. No med changes today  FU twice weekly Spravato for 4 weeks and reevaluate.  CLynder Parents MD, DFAPA    Please see After Visit Summary for patient specific instructions.  Future Appointments  Date Time Provider [place] 06/19/2022  8:00 AM CP-NURSE CP-CP None  06/19/2022  9:00 AM Cottle, CBilley Co, MD CP-CP None  07/01/2022  9:00 AM CThayer Headings PMHNP CP-CP None  08/18/2022  2:35 PM MMegan Salon MD [facility]    No orders of the defined types were placed in this encounter.   ------------------------------- T

## 2022-06-17 NOTE — Progress Notes (Signed)
NURSES NOTE:     Patient arrived for her 47th Spravato treatment. Pt is being treated for Treatment Resistant Depression, the starting dose is 56 mg (2 of the 28 mg) nasal sprays. Pt will be receiving 84 mg, this is her maintenance dose. Patient taken to treatment room, I explained and discussed her treatment and how the schedule should go, along with any side effects that may occur. Answered any questions and concerns the patient had. Pt's Spravato is delivered through [place] and she is now billing with her insurance. Spravato medication is stored at doctors office per REMS/FDA guidelines. The medication is required to be locked behind two doors per FDA/REMS Protocol. Medication is also disposed of properly per regulations.      Vital Signs assessed at 2:30 PM 112/72, pulse 72, pulse ox 92%. Instructed pt to blow her nose and recline back slightly to prevent any of medication dripping out of her nose. Pt. Given 1st dose (28 mg inhaler), then 5 minutes between 2nd dose and 3rd dose. No complaints of nausea/vomiting reported. Pt did listen to music today, she just reclined back and closed her eyes. After 40 minutes I went to assess her vital signs, no side effects or symptoms reported, B/P 120/72, pulse 69. Informed pt she could rest in the chair Donnal Moat, PA-C came in to discuss her treatment at the end. She had no questions/concerns, Nurse was with pt a total of 60 minutes but pt observed for 120 minutes per protocol. Discharge vital signs were at 4:20 PM, B/P 109/68, pulse 73. Pt denies a headache. She reports dissociation she was clear upon her discharge, she left with her husband. She scheduled her next apt Thursday morning at 8:00 AM. Pt is doing more and getting out of house more. Pt feeling much better.   LOT # [id number] EXP JAN 2027

## 2022-06-17 NOTE — Telephone Encounter (Signed)
Heather Davila called in today at 9:30. Did take Gabapentin for sleep last night. Took up to 965m and only slept 8pm-11pm. Please call.

## 2022-06-17 NOTE — Telephone Encounter (Signed)
Please see message. Dr. Clovis Pu will see her today after her treatment. I will let him know as well.

## 2022-06-18 NOTE — Telephone Encounter (Signed)
Would you please call patient to relay the information I sent to her in the My Chart message? It looks like she has not read it yet. Thanks.

## 2022-06-18 NOTE — Telephone Encounter (Signed)
Called patient with the recommendations. She had been taking 2 trazodone.

## 2022-06-19 ENCOUNTER — Ambulatory Visit (INDEPENDENT_AMBULATORY_CARE_PROVIDER_SITE_OTHER): Payer: 59 | Admitting: Psychiatry

## 2022-06-19 ENCOUNTER — Ambulatory Visit: Payer: 59

## 2022-06-19 VITALS — BP 125/76 | HR 70

## 2022-06-19 DIAGNOSIS — G4700 Insomnia, unspecified: Secondary | ICD-10-CM | POA: Diagnosis not present

## 2022-06-19 DIAGNOSIS — F339 Major depressive disorder, recurrent, unspecified: Secondary | ICD-10-CM | POA: Diagnosis not present

## 2022-06-19 DIAGNOSIS — F900 Attention-deficit hyperactivity disorder, predominantly inattentive type: Secondary | ICD-10-CM

## 2022-06-19 DIAGNOSIS — F411 Generalized anxiety disorder: Secondary | ICD-10-CM

## 2022-06-20 ENCOUNTER — Telehealth: Payer: Self-pay | Admitting: Psychiatry

## 2022-06-20 ENCOUNTER — Encounter: Payer: Self-pay | Admitting: Psychiatry

## 2022-06-20 DIAGNOSIS — F900 Attention-deficit hyperactivity disorder, predominantly inattentive type: Secondary | ICD-10-CM

## 2022-06-20 NOTE — Progress Notes (Signed)
Heather Davila [id number] Feb 16, 1961 62 y.o.  Subjective:   Patient ID:  Heather Davila is a 62 y.o. (DOB February 22, 1961) female.  Chief Complaint:  Chief Complaint  Patient presents with   Follow-up   Depression   Anxiety   Sleeping Problem    HPI Heather Davila presents to the office today for follow-up of treatment resistant recurrent depression, generalized anxiety disorder and insomnia.  Almost too numerous to count med failures. She is here for Spravato administration for her treatment resistant depression.  05/21/22 appt noted: Patient was administered first dose Spravato 56 mg intranasally today.  The patient experienced the typical dissociation which gradually resolved over the 2-hour period of observation.  There were no complications.  Specifically the patient did not have nausea or vomiting or headache.   Dissociation was mild. Did not experience any emotional relief from disabling depression.wants to increase Spravato to the usual dose.  She is aware insurance is not covering the costs at this time.  No SI acutely. Tolerating meds.    05/23/22 appt noted: Patient was administered Spravato 84 mg intranasally today.  The patient experienced the typical dissociation which gradually resolved over the 2-hour period of observation.  There were no complications.  Specifically the patient did not have nausea or vomiting.   Dissociation was greater than last dose and a little bothersome DT some HA. Tolerating meds.   Mood has not changed significantly thus far with Spravato.  05/27/22 appt noted: Patient was administered Spravato 84 mg intranasally today.  The patient experienced the typical dissociation which gradually resolved over the 2-hour period of observation.  There were no complications.  Specifically the patient did not have nausea or vomiting or headache. Today she has felt a little relief from the pressing heaviness of depression but a little more anxiety.  During  administration of Spravato she listens to [place] and was able to relax a little more with the feeling of dissociation that was mildly bothersome. Husband was also in on the session today and asked some questions about IV ketamine versus nasal spray ketamine.  05/29/22 appt noted: Cancelled today DT hypertension  05/30/22 appt noted: Patient was administered Spravato 84 mg intranasally today.  The patient experienced the typical dissociation which gradually resolved over the 2-hour period of observation.  There were no complications.  Specifically the patient did not have nausea or vomiting or headache. She is seeing some improvement in mood with less continuous depression and less intensity.  Hopefulness is better.   No med complaints or SE  06/05/22 appt noted: Patient was administered Spravato 84 mg intranasally today.  The patient experienced the typical dissociation which gradually resolved over the 2-hour period of observation.  There were no complications.  Specifically the patient did not have nausea or vomiting or headache.  Specifically HA is less of a problem with Spravato. No SE with meds. Depression is improving with less intensity.  Intermittent anxiety easily.  More able to enjoy things.  No new concerns.  06/17/22 appt noted: Patient was administered Spravato 84 mg intranasally today.  The patient experienced the typical dissociation which gradually resolved over the 2-hour period of observation.  There were no complications.  Specifically the patient did not have nausea or vomiting or headache.  Specifically HA is less of a problem with Spravato. No SE with meds. Depression is improved 45% with less intensity.  Intermittent anxiety less easily.  More able to enjoy things.  No new concerns.  More active. Current meds:  increased clonazepam to about 1.5 mg HS DT recent insomnia, lexapro 20, Dayvigo 10 mg HS, concerta 36 mg AM, trazodone 100 mg HS.  06/20/22 appt noted: Current  meds: increased clonazepam to about 1.5 mg HS DT recent insomnia, lexapro 20, Dayvigo 10 mg HS, concerta 36 mg AM, trazodone 100 mg HS. Patient was administered Spravato 84 mg intranasally today.  The patient experienced the typical dissociation which gradually resolved over the 2-hour period of observation.  There were no complications.  Specifically the patient did not have nausea or vomiting or headache.  Specifically HA is less of a problem with Spravato. No SE with meds. Depression is improved 45% with less intensity.  Intermittent anxiety less easily.  More able to enjoy things.  No new concerns.  More active.  Past Psychiatric Medication Trials: Trintellix - Initially effective and then was not effective when re-started Paxil- Ineffective. Helped initially. Prozac-Insomnia Lexapro- Effective and then no longer as effective Zoloft- Initially effective and then not as effective.  Viibryd Cymbalta- Ineffective. Helped initially. Effexor XR- Effective, well tolerated. Pristiq- Increased anxiety at 150 mg daily Wellbutrin XL-Ineffective.May have increased anxiety. Amitriptyline Nortriptyline-Caused irritability, constipation Auvelity- ineffective Abilify- Helpful but caused severe insomnia. Rexulti-Helpful but caused insomnia. Vraylar Seroquel - Ineffective Risperdal Olanzapine- Ineffective Latuda- Ineffective Caplyta Klonopin- Effective Temazepam- Took during menopause Lunesta- Ineffective Ambien-Ineffective Sonata- Ineffective Dayvigo- partially effective Trazodone- Ineffective Remeron-Ineffective. Does not recall wt gain.  Adderall- Took once and had adverse effect. Concerta- Effective Metadate-Not as effective as Concerta Dexmethylphenidate- Not as effective as Concerta Azstarys- some side effects. Not as effective.  Lamictal- May have had an adverse effects. "Felt weird." Reports worsening s/s.  Lithium Gabapentin Pramipexole- "felt really weird."   ECT #20 with  minimal response. H says it was partly helpful.  AIMS    Flowsheet Row Video Visit from 01/03/2022 in [facility] Total Score 0      ECT-MADRS    Flowsheet Row Video Visit from 04/29/2022 in [facility] Video Visit from 02/06/2022 in [facility]  MADRS Total Score 44 23      PHQ2-9    Flowsheet Row Video Visit from 02/06/2022 in [place] Office Visit from 11/16/2020 in [facility] for [facility] [place] Visit from 04/19/2018 in [place] at [facility] Total Score 6 0 0  PHQ-9 Total Score 14 -- 2        Review of Systems:  Review of Systems  Constitutional:  Positive for fatigue.  Cardiovascular:  Negative for chest pain and palpitations.  Neurological:  Negative for dizziness and tremors.  Psychiatric/Behavioral:  Positive for dysphoric mood. Negative for decreased concentration. The patient is nervous/anxious.     Medications: I have reviewed the patient's current medications.  Current Outpatient Medications  Medication Sig Dispense Refill   clonazePAM (KLONOPIN) 0.5 MG tablet Take 1-1.5 tabs at bedtime and 1/2-1 tab as needed for anxiety. 60 tablet 1   doxycycline (VIBRAMYCIN) 50 MG capsule Take 50 mg by mouth daily.     escitalopram (LEXAPRO) 20 MG tablet Take 1 tablet (20 mg total) by mouth daily. 30 tablet 2   Esketamine HCl, 84 MG Dose, (SPRAVATO, 84 MG DOSE,) 28 MG/DEVICE SOPK Place 84 mg into the nose every 3 (three) days. 3 each 5   estradiol (ESTRACE) 0.1 MG/GM vaginal cream USE 1 GM VAGINALLY TWICE A WEEK AT BEDTIME 42.5 g 0  gabapentin (NEURONTIN) 100 MG capsule Take 1 capsule (100 mg total) by mouth 3 (three) times daily as needed. Take 1 capsule three times daily 90 capsule 1   gabapentin (NEURONTIN) 300 MG capsule Take 1-2 capsules at bedtime 60 capsule 1   Lemborexant  (DAYVIGO) 10 MG TABS Take 1/2-1 tab po QHS 30 tablet 2   MAGNESIUM PO Take by mouth.     methylphenidate 36 MG PO CR tablet Take 1 tablet (36 mg total) by mouth daily as needed. Do not take on Spravato treatment days. 30 tablet 0   Multiple Vitamin (MULTIVITAMIN) tablet Take 1 tablet by mouth daily.     traZODone (DESYREL) 100 MG tablet Take 1 tablet (100 mg total) by mouth at bedtime. Take 1-2 tabs po QHS prn 180 tablet 1   valACYclovir (VALTREX) 500 MG tablet Take one twice daily for 3 days with any symptoms 30 tablet 3   No current facility-administered medications for this visit.    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   TRANSAMINASES, SERUM, ELEVATED 08/14/2009    Past Medical History, Surgical history, Social history, and Family history were reviewed and updated as appropriate.   Please see review of systems for further details on the patient's review from today.   Objective:   Physical Exam:  LMP 12/27/2007 (Exact Date)   Physical Exam Constitutional:      General: She is not in acute distress. Musculoskeletal:        General: No deformity.  Neurological:     Mental Status: She is alert and oriented to person, place, and time.     Coordination: Coordination normal.  Psychiatric:        Attention and Perception: Attention and perception normal. She does not perceive auditory or visual hallucinations.        Mood and Affect: Mood is anxious and depressed. Affect is blunt. Affect is not labile or angry.        Speech: Speech normal.        Behavior: Behavior normal.        Thought Content:  Thought content normal. Thought content is not paranoid or delusional. Thought content does not include homicidal or suicidal ideation. Thought content does not include suicidal plan.        Cognition and Memory: Cognition and memory normal.        Judgment: Judgment normal.     Comments: Insight intact Less intense depression by 45% less blunted     Lab Review:     Component Value Date/Time   NA 138 04/22/2022 0932   K 4.1 04/22/2022 0932   CL 98 04/22/2022 0932   CO2 31 04/22/2022 0932   GLUCOSE 102 (H) 04/22/2022 0932   BUN 10 04/22/2022 0932   CREATININE 0.71 04/22/2022 0932   CREATININE 0.68 01/26/2020 0920   CALCIUM 10.5 04/22/2022 0932   PROT 8.3 04/22/2022 0932   ALBUMIN 5.3 (H) 04/22/2022 0932   AST 82 (H) 04/22/2022 0932   ALT 118 (H) 04/22/2022 0932  ALKPHOS 74 04/22/2022 0932   BILITOT 0.3 04/22/2022 0932   GFRNONAA >90 05/01/2014 1645   GFRAA >90 05/01/2014 1645       Component Value Date/Time   WBC 3.3 (L) 04/22/2022 0932   RBC 4.25 04/22/2022 0932   HGB 14.3 04/22/2022 0932   HGB 14.1 10/26/2014 1034   HCT 40.7 04/22/2022 0932   PLT 245.0 04/22/2022 0932   MCV 95.7 04/22/2022 0932   MCH 34.4 (H) 01/26/2020 0920   MCHC 35.1 04/22/2022 0932   RDW 13.2 04/22/2022 0932   LYMPHSABS 0.9 04/22/2022 0932   MONOABS 0.2 04/22/2022 0932   EOSABS 0.1 04/22/2022 0932   BASOSABS 0.0 04/22/2022 0932    No results found for: "POCLITH", "LITHIUM"   No results found for: "PHENYTOIN", "PHENOBARB", "VALPROATE", "CBMZ"   .res Assessment: Plan:    Malaija was seen today for follow-up, depression, anxiety and sleeping problem.  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  Patient was administered Spravato 84 mg  intranasally today.  The patient experienced the typical dissociation which gradually resolved over the 2-hour period of observation.  There were no complications.  Specifically the patient did not have nausea or vomiting. But mild headache.  Blood pressures remained within normal ranges at the 40-minute and 2-hour follow-up intervals.  By the time the 2-hour observation period was met the patient was alert and oriented and able to exit without assistance.  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  See nursing note for further details.Tolerated the Spravato to '84mg'$  in hopes of improvement as soon as possible.  Regarding med options.  She has obviously been thoroughly tried on numerous medications.  The only antidepressant category that she has not taken is MAO inhibitors which are not compatible with Spravato.  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 Disc this with her previously.    Today she was a little less depressed and more hopeful.  Looks like starting to improve with Spravato.  Wants to continue the tx plan  Tolerating meds.  Satisfied she is making progress No med changes today, working on sleep meds.  FU twice weekly Spravato until improvement maxes out then drop back to weekly.  Lynder Parents, MD, DFAPA    Please see After Visit Summary for patient specific instructions.  Future Appointments  Date Time Provider [place]  06/23/2022  2:00 PM Cottle, Billey Co., MD CP-CP None  06/23/2022  2:00 PM CP-NURSE CP-CP None  06/26/2022  8:00 AM CP-NURSE CP-CP None  06/26/2022  9:30 AM Cottle, Billey Co., MD CP-CP None  07/01/2022  9:00 AM [name], PMHNP CP-CP None  08/18/2022  2:35 PM Megan Salon, MD [facility]    No orders of the defined  types were placed in this encounter.   ------------------------------- T

## 2022-06-20 NOTE — Progress Notes (Addendum)
Yesenya Lappin [id number] 06-17-1960 62 y.o.  Subjective:   Patient ID:  Heather Davila is a 62 y.o. (DOB December 22, 1960) female.  Chief Complaint:  Chief Complaint  Patient presents with   Follow-up   Depression   Anxiety    HPI Reather Halilovic presents to the office today for follow-up of treatment resistant recurrent depression, generalized anxiety disorder and insomnia.  Almost too numerous to count med failures. She is here for Spravato administration for her treatment resistant depression.  05/21/22 appt noted: Patient was administered first dose Spravato 56 mg intranasally today.  The patient experienced the typical dissociation which gradually resolved over the 2-hour period of observation.  There were no complications.  Specifically the patient did not have nausea or vomiting or headache.   Dissociation was mild. Did not experience any emotional relief from disabling depression.wants to increase Spravato to the usual dose.  She is aware insurance is not covering the costs at this time.  No SI acutely. Tolerating meds.    05/23/22 appt noted: Patient was administered Spravato 84 mg intranasally today.  The patient experienced the typical dissociation which gradually resolved over the 2-hour period of observation.  There were no complications.  Specifically the patient did not have nausea or vomiting.   Dissociation was greater than last dose and a little bothersome DT some HA. Tolerating meds.   Mood has not changed significantly thus far with Spravato.  05/27/22 appt noted: Patient was administered Spravato 84 mg intranasally today.  The patient experienced the typical dissociation which gradually resolved over the 2-hour period of observation.  There were no complications.  Specifically the patient did not have nausea or vomiting or headache. Today she has felt a little relief from the pressing heaviness of depression but a little more anxiety.  During administration of Spravato she  listens to [place] and was able to relax a little more with the feeling of dissociation that was mildly bothersome. Husband was also in on the session today and asked some questions about IV ketamine versus nasal spray ketamine.  05/29/22 appt noted: Cancelled today DT hypertension  05/30/22 appt noted: Patient was administered Spravato 84 mg intranasally today.  The patient experienced the typical dissociation which gradually resolved over the 2-hour period of observation.  There were no complications.  Specifically the patient did not have nausea or vomiting or headache. She is seeing some improvement in mood with less continuous depression and less intensity.  Hopefulness is better.   No med complaints or SE  06/05/22 appt noted: Patient was administered Spravato 84 mg intranasally today.  The patient experienced the typical dissociation which gradually resolved over the 2-hour period of observation.  There were no complications.  Specifically the patient did not have nausea or vomiting or headache.  Specifically HA is less of a problem with Spravato. No SE with meds. Depression is improving with less intensity.  Intermittent anxiety easily.  More able to enjoy things.  No new concerns.  06/17/22 appt noted: Patient was administered Spravato 84 mg intranasally today.  The patient experienced the typical dissociation which gradually resolved over the 2-hour period of observation.  There were no complications.  Specifically the patient did not have nausea or vomiting or headache.  Specifically HA is less of a problem with Spravato. No SE with meds. Depression is improved 45% with less intensity.  Intermittent anxiety less easily.  More able to enjoy things.  No new concerns.  More active. Current meds: increased clonazepam to about  1.5 mg HS DT recent insomnia, lexapro 20, Dayvigo 10 mg HS, concerta 36 mg AM, trazodone 100 mg HS.  Past Psychiatric Medication Trials: Trintellix - Initially  effective and then was not effective when re-started Paxil- Ineffective. Helped initially. Prozac-Insomnia Lexapro- Effective and then no longer as effective Zoloft- Initially effective and then not as effective.  Viibryd Cymbalta- Ineffective. Helped initially. Effexor XR- Effective, well tolerated. Pristiq- Increased anxiety at 150 mg daily Wellbutrin XL-Ineffective.May have increased anxiety. Amitriptyline Nortriptyline-Caused irritability, constipation Auvelity- ineffective Abilify- Helpful but caused severe insomnia. Rexulti-Helpful but caused insomnia. Vraylar Seroquel - Ineffective Risperdal Olanzapine- Ineffective Latuda- Ineffective Caplyta Klonopin- Effective Temazepam- Took during menopause Lunesta- Ineffective Ambien-Ineffective Sonata- Ineffective Dayvigo- partially effective Trazodone- Ineffective Remeron-Ineffective. Does not recall wt gain.  Adderall- Took once and had adverse effect. Concerta- Effective Metadate-Not as effective as Concerta Dexmethylphenidate- Not as effective as Concerta Azstarys- some side effects. Not as effective.  Lamictal- May have had an adverse effects. "Felt weird." Reports worsening s/s.  Lithium Gabapentin Pramipexole- "felt really weird."   ECT #20 with minimal response. H says it was partly helpful.  AIMS    Flowsheet Row Video Visit from 01/03/2022 in [place] Total Score 0      ECT-MADRS    Flowsheet Row Video Visit from 04/29/2022 in [facility] Video Visit from 02/06/2022 in [facility]  MADRS Total Score 44 23      PHQ2-9    Flowsheet Row Video Visit from 02/06/2022 in [place] Office Visit from 11/16/2020 in [facility] for [facility] at [place] Visit from 04/19/2018 in [place] at [facility] Total Score 6 0 0  PHQ-9 Total  Score 14 -- 2        Review of Systems:  Review of Systems  Constitutional:  Positive for fatigue.  Cardiovascular:  Negative for chest pain and palpitations.  Neurological:  Negative for dizziness and tremors.  Psychiatric/Behavioral:  Positive for decreased concentration and dysphoric mood. The patient is nervous/anxious.     Medications: I have reviewed the patient's current medications.  Current Outpatient Medications  Medication Sig Dispense Refill   clonazePAM (KLONOPIN) 0.5 MG tablet Take 1-1.5 tabs at bedtime and 1/2-1 tab as needed for anxiety. 60 tablet 1   doxycycline (VIBRAMYCIN) 50 MG capsule Take 50 mg by mouth daily.     escitalopram (LEXAPRO) 20 MG tablet Take 1 tablet (20 mg total) by mouth daily. 30 tablet 2   Esketamine HCl, 84 MG Dose, (SPRAVATO, 84 MG DOSE,) 28 MG/DEVICE SOPK Place 84 mg into the nose every 3 (three) days. 3 each 5   estradiol (ESTRACE) 0.1 MG/GM vaginal cream USE 1 GM VAGINALLY TWICE A WEEK AT BEDTIME 42.5 g 0   gabapentin (NEURONTIN) 100 MG capsule Take 1 capsule (100 mg total) by mouth 3 (three) times daily as needed. Take 1 capsule three times daily 90 capsule 1   gabapentin (NEURONTIN) 300 MG capsule Take 1-2 capsules at bedtime 60 capsule 1   Lemborexant (DAYVIGO) 10 MG TABS Take 1/2-1 tab po QHS 30 tablet 2   MAGNESIUM PO Take by mouth.     methylphenidate 36 MG PO CR tablet Take 1 tablet (36 mg total) by mouth daily as needed. Do not take on Spravato treatment days. 30 tablet 0   Multiple Vitamin (MULTIVITAMIN) tablet Take 1 tablet by mouth daily.     traZODone (  DESYREL) 100 MG tablet Take 1 tablet (100 mg total) by mouth at bedtime. Take 1-2 tabs po QHS prn 180 tablet 1   valACYclovir (VALTREX) 500 MG tablet Take one twice daily for 3 days with any symptoms 30 tablet 3   No current facility-administered medications for this visit.    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   TRANSAMINASES, SERUM, ELEVATED 08/14/2009    Past Medical History, Surgical history, Social history, and Family history were reviewed and updated as appropriate.   Please see review of systems for further details on the patient's review from today.   Objective:   Physical Exam:  LMP 12/27/2007 (Exact Date)   Physical Exam Constitutional:      General: She is not in acute distress. Musculoskeletal:        General: No deformity.  Neurological:     Mental Status: She is alert and oriented to person, place, and time.     Coordination: Coordination normal.  Psychiatric:        Attention and Perception: Attention and perception normal. She does not perceive auditory or visual hallucinations.        Mood and Affect: Mood is anxious and depressed. Affect is blunt. Affect is not labile or angry.        Speech: Speech normal.        Behavior: Behavior normal. Behavior is not slowed.        Thought Content: Thought content normal. Thought content is not paranoid or delusional. Thought content does not include homicidal or suicidal ideation. Thought content does not include suicidal plan.        Cognition and Memory: Cognition and memory normal.        Judgment: Judgment normal.     Comments: Insight intact Less intense depression by 45% less blunted     Lab Review:     Component Value Date/Time   NA 138 04/22/2022 0932   K 4.1 04/22/2022 0932   CL 98 04/22/2022 0932   CO2 31 04/22/2022 0932   GLUCOSE 102 (H) 04/22/2022 0932   BUN 10 04/22/2022 0932   CREATININE 0.71 04/22/2022 0932   CREATININE 0.68 01/26/2020 0920   CALCIUM 10.5 04/22/2022 0932   PROT  8.3 04/22/2022 0932   ALBUMIN 5.3 (H) 04/22/2022 0932   AST 82 (H) 04/22/2022 0932   ALT 118 (H) 04/22/2022 0932   ALKPHOS 74 04/22/2022 0932   BILITOT 0.3 04/22/2022 0932   GFRNONAA >90 05/01/2014 1645   GFRAA >90 05/01/2014 1645       Component Value Date/Time   WBC 3.3 (L) 04/22/2022 0932   RBC 4.25 04/22/2022 0932   HGB 14.3 04/22/2022 0932   HGB 14.1 10/26/2014 1034   HCT 40.7 04/22/2022 0932   PLT 245.0 04/22/2022 0932   MCV 95.7 04/22/2022 0932   MCH 34.4 (H) 01/26/2020 0920   MCHC 35.1 04/22/2022 0932   RDW 13.2 04/22/2022 0932   LYMPHSABS 0.9 04/22/2022 0932   MONOABS 0.2 04/22/2022 0932  EOSABS 0.1 04/22/2022 0932   BASOSABS 0.0 04/22/2022 0932    No results found for: "POCLITH", "LITHIUM"   No results found for: "PHENYTOIN", "PHENOBARB", "VALPROATE", "CBMZ"   .res Assessment: Plan:    Fantasy was seen today for follow-up, depression and anxiety.  Diagnoses and all orders for this visit:  Recurrent major depression resistant to treatment [facility])  Attention deficit hyperactivity disorder (ADHD), predominantly inattentive type  Generalized anxiety disorder  Insomnia, unspecified type    Pt seen for TRD and administration of Spravato.  Patient was administered Spravato 84 mg intranasally today.  The patient experienced the typical dissociation which gradually resolved over the 2-hour period of observation.  There were no complications.  Specifically the patient did not have nausea or vomiting. But mild headache.  Blood pressures remained within normal ranges at the 40-minute and 2-hour follow-up intervals.  By the time the 2-hour observation period was met the patient was alert and oriented and able to exit without assistance.  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  See nursing note for further details.Tolerated the Spravato to '84mg'$  in hopes of improvement as soon as  possible.  Regarding med options.  She has obviously been thoroughly tried on numerous medications.  The only antidepressant category that she has not taken is MAO inhibitors which are not compatible with Spravato.  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 Disc this with her previously.    Today she was a little less depressed and more hopeful.  Looks like starting to improve with Spravato.  Wants to continue the tx plan  Tolerating meds. No med changes today, working on sleep meds.  FU twice weekly Spravato until improvement maxes out then drop back to weekly.  Lynder Parents, MD, DFAPA    Please see After Visit Summary for patient specific instructions.  Future Appointments  Date Time Provider [place]  06/23/2022  2:00 PM Cottle, Billey Co., MD CP-CP None  06/23/2022  2:00 PM CP-NURSE CP-CP None  06/26/2022  8:00 AM CP-NURSE CP-CP None  06/26/2022  9:30 AM Cottle, Billey Co., MD CP-CP None  07/01/2022  9:00 AM [name], PMHNP CP-CP None  08/18/2022  2:35 PM Megan Salon, MD [facility]    No orders of the defined types were placed in this encounter.   ------------------------------- T

## 2022-06-20 NOTE — Telephone Encounter (Signed)
Pt requesting lower dose of generic Concerta now 36 mg. Not working now, wants to try to decrease due to BP. [facility] [place].

## 2022-06-20 NOTE — Telephone Encounter (Signed)
Recommend holding Concerta for now until BP improves.

## 2022-06-23 ENCOUNTER — Encounter: Payer: Self-pay | Admitting: Psychiatry

## 2022-06-23 ENCOUNTER — Ambulatory Visit: Payer: 59

## 2022-06-23 ENCOUNTER — Ambulatory Visit (INDEPENDENT_AMBULATORY_CARE_PROVIDER_SITE_OTHER): Payer: 59 | Admitting: Psychiatry

## 2022-06-23 VITALS — BP 109/62 | HR 62

## 2022-06-23 DIAGNOSIS — G4700 Insomnia, unspecified: Secondary | ICD-10-CM | POA: Diagnosis not present

## 2022-06-23 DIAGNOSIS — F900 Attention-deficit hyperactivity disorder, predominantly inattentive type: Secondary | ICD-10-CM

## 2022-06-23 DIAGNOSIS — F339 Major depressive disorder, recurrent, unspecified: Secondary | ICD-10-CM

## 2022-06-23 DIAGNOSIS — F411 Generalized anxiety disorder: Secondary | ICD-10-CM | POA: Diagnosis not present

## 2022-06-23 NOTE — Progress Notes (Signed)
NURSES NOTE:     Patient arrived for her 10th Spravato treatment. Pt is being treated for Treatment Resistant Depression, the starting dose is 56 mg (2 of the 28 mg) nasal sprays. Pt will be receiving 84 mg, this is her maintenance dose. Patient taken to treatment room, I explained and discussed her treatment and how the schedule should go, along with any side effects that may occur. Answered any questions and concerns the patient had. Pt's Spravato is delivered through [place] and she is now billing with her insurance. Spravato medication is stored at doctors office per REMS/FDA guidelines. The medication is required to be locked behind two doors per FDA/REMS Protocol. Medication is also disposed of properly per regulations.      Vital Signs assessed at 8:00 AM 116/70, pulse 63, pulse ox 92%. Instructed pt to blow her nose and recline back slightly to prevent any of medication dripping out of her nose. Pt. Given 1st dose (28 mg inhaler), then 5 minutes between 2nd dose and 3rd dose. No complaints of nausea/vomiting reported. Pt did listen to music today, she just reclined back and closed her eyes. After 40 minutes I went to assess her vital signs, no side effects or symptoms reported, B/P 120/69, pulse 63. Informed pt she could rest in the chair Dr. Clovis Pu came in to discuss her treatment at the end. She had no questions/concerns, Nurse was with pt a total of 60 minutes but pt observed for 120 minutes per protocol. Discharge vital signs were at 10:00 AM, B/P 125/76, pulse 70. Pt denies a headache. She reports dissociation she was clear upon her discharge, she left with her husband. She scheduled her next apt Monday afternoon at 2:00 PM. Pt is doing more and getting out of house more. Pt feeling much better.   LOT # [id number] EXP FEB 2027

## 2022-06-23 NOTE — Progress Notes (Signed)
NURSES NOTE:     Patient arrived for her 9th Spravato treatment. Pt is being treated for Treatment Resistant Depression, the starting dose is 56 mg (2 of the 28 mg) nasal sprays. Pt will be receiving 84 mg, this is her maintenance dose. Patient taken to treatment room, I explained and discussed her treatment and how the schedule should go, along with any side effects that may occur. Answered any questions and concerns the patient had. Pt's Spravato is delivered through [place] and she is now billing with her insurance. Spravato medication is stored at doctors office per REMS/FDA guidelines. The medication is required to be locked behind two doors per FDA/REMS Protocol. Medication is also disposed of properly per regulations.      Vital Signs assessed at 2:00 PM 113/60, pulse 70, pulse ox 92%. Instructed pt to blow her nose and recline back slightly to prevent any of medication dripping out of her nose. Pt. Given 1st dose (28 mg inhaler), then 5 minutes between 2nd dose and 3rd dose. No complaints of nausea/vomiting reported. Pt did listen to music today, she just reclined back and closed her eyes. After 40 minutes I went to assess her vital signs, no side effects or symptoms reported, B/P 109/61, pulse 72. Informed pt she could rest in the chair Dr. Clovis Pu came in to discuss her treatment at the end. She had no questions/concerns, Nurse was with pt a total of 60 minutes but pt observed for 120 minutes per protocol. Discharge vital signs were at 4:00 PM, B/P 98/59, pulse 71. Pt denies a headache. She reports dissociation she was clear upon her discharge, she left with her husband. She scheduled her next apt Thursday morning at 8:00 AM. Pt is doing more and getting out of house more. Pt feeling much better.   LOT # [id number] EXP FEB 2027

## 2022-06-23 NOTE — Progress Notes (Signed)
[name] [id number] 20-Mar-1961 62 y.o.  Subjective:   Patient ID:  Heather Davila is a 62 y.o. (DOB 1961/04/09) female.  Chief Complaint:  Chief Complaint  Patient presents with   Follow-up   Depression   Sleeping Problem    HPI Heather Davila presents to the office today for follow-up of treatment resistant recurrent depression, generalized anxiety disorder and insomnia.  Almost too numerous to count med failures. She is here for Spravato administration for her treatment resistant depression.  05/21/22 appt noted: Patient was administered first dose Spravato 56 mg intranasally today.  The patient experienced the typical dissociation which gradually resolved over the 2-hour period of observation.  There were no complications.  Specifically the patient did not have nausea or vomiting or headache.   Dissociation was mild. Did not experience any emotional relief from disabling depression.wants to increase Spravato to the usual dose.  She is aware insurance is not covering the costs at this time.  No SI acutely. Tolerating meds.    05/23/22 appt noted: Patient was administered Spravato 84 mg intranasally today.  The patient experienced the typical dissociation which gradually resolved over the 2-hour period of observation.  There were no complications.  Specifically the patient did not have nausea or vomiting.   Dissociation was greater than last dose and a little bothersome DT some HA. Tolerating meds.   Mood has not changed significantly thus far with Spravato.  05/27/22 appt noted: Patient was administered Spravato 84 mg intranasally today.  The patient experienced the typical dissociation which gradually resolved over the 2-hour period of observation.  There were no complications.  Specifically the patient did not have nausea or vomiting or headache. Today she has felt a little relief from the pressing heaviness of depression but a little more anxiety.  During administration of  Spravato she listens to [place] and was able to relax a little more with the feeling of dissociation that was mildly bothersome. Husband was also in on the session today and asked some questions about IV ketamine versus nasal spray ketamine.  05/29/22 appt noted: Cancelled today DT hypertension  05/30/22 appt noted: Patient was administered Spravato 84 mg intranasally today.  The patient experienced the typical dissociation which gradually resolved over the 2-hour period of observation.  There were no complications.  Specifically the patient did not have nausea or vomiting or headache. She is seeing some improvement in mood with less continuous depression and less intensity.  Hopefulness is better.   No med complaints or SE  06/05/22 appt noted: Patient was administered Spravato 84 mg intranasally today.  The patient experienced the typical dissociation which gradually resolved over the 2-hour period of observation.  There were no complications.  Specifically the patient did not have nausea or vomiting or headache.  Specifically HA is less of a problem with Spravato. No SE with meds. Depression is improving with less intensity.  Intermittent anxiety easily.  More able to enjoy things.  No new concerns.  06/17/22 appt noted: Patient was administered Spravato 84 mg intranasally today.  The patient experienced the typical dissociation which gradually resolved over the 2-hour period of observation.  There were no complications.  Specifically the patient did not have nausea or vomiting or headache.  Specifically HA is less of a problem with Spravato. No SE with meds. Depression is improved 45% with less intensity.  Intermittent anxiety less easily.  More able to enjoy things.  No new concerns.  More active. Current meds: increased clonazepam to  about 1.5 mg HS DT recent insomnia, lexapro 20, Dayvigo 10 mg HS, concerta 36 mg AM, trazodone 100 mg HS.  06/20/22 appt noted: Current meds: increased  clonazepam to about 1.5 mg HS DT recent insomnia, lexapro 20, Dayvigo 10 mg HS, concerta 36 mg AM, trazodone 100 mg HS. Patient was administered Spravato 84 mg intranasally today.  The patient experienced the typical dissociation which gradually resolved over the 2-hour period of observation.  There were no complications.  Specifically the patient did not have nausea or vomiting or headache.  Specifically HA is less of a problem with Spravato. No SE with meds. Depression is improved 45% with less intensity.  Intermittent anxiety less easily.  More able to enjoy things.  No new concerns.  More active.  06/23/22 appt noted:  Current meds: increased clonazepam to about 1.5 mg HS DT recent insomnia, lexapro 20, Dayvigo 10 mg HS, concerta 36 mg AM, trazodone 100 mg HS. Patient was administered Spravato 84 mg intranasally today.  The patient experienced the typical dissociation which gradually resolved over the 2-hour period of observation.  There were no complications.  Specifically the patient did not have nausea or vomiting or headache.  Specifically HA is less of a problem with Spravato. No SE with meds. Intensity of Spravato dissociation varies.  Last week very intesne and this week more typical.  Depression is continuing to improve with better interest and activity and motivation.  Gradual improvement.  Wants to continue.  Past Psychiatric Medication Trials: Trintellix - Initially effective and then was not effective when re-started Paxil- Ineffective. Helped initially. Prozac-Insomnia Lexapro- Effective and then no longer as effective Zoloft- Initially effective and then not as effective.  Viibryd Cymbalta- Ineffective. Helped initially. Effexor XR- Effective, well tolerated. Pristiq- Increased anxiety at 150 mg daily Wellbutrin XL-Ineffective.May have increased anxiety. Amitriptyline Nortriptyline-Caused irritability, constipation Auvelity- ineffective Abilify- Helpful but caused severe  insomnia. Rexulti-Helpful but caused insomnia. Vraylar Seroquel - Ineffective Risperdal Olanzapine- Ineffective Latuda- Ineffective Caplyta Klonopin- Effective Temazepam- Took during menopause Lunesta- Ineffective Ambien-Ineffective Sonata- Ineffective Dayvigo- partially effective Trazodone- Ineffective Remeron-Ineffective. Does not recall wt gain.  Adderall- Took once and had adverse effect. Concerta- Effective Metadate-Not as effective as Concerta Dexmethylphenidate- Not as effective as Concerta Azstarys- some side effects. Not as effective.  Lamictal- May have had an adverse effects. "Felt weird." Reports worsening s/s.  Lithium Gabapentin Pramipexole- "felt really weird."   ECT #20 with minimal response. H says it was partly helpful.  AIMS    Flowsheet Row Video Visit from 01/03/2022 in [place] Total Score 0      ECT-MADRS    Flowsheet Row Video Visit from 04/29/2022 in [facility] Video Visit from 02/06/2022 in [facility]  MADRS Total Score 44 23      PHQ2-9    Flowsheet Row Video Visit from 02/06/2022 in [place] Office Visit from 11/16/2020 in [facility] Visit from 04/19/2018 in [place] [facility] Total Score 6 0 0  PHQ-9 Total Score 14 -- 2        Review of Systems:  Review of Systems  Constitutional:  Positive for fatigue.  Cardiovascular:  Negative for chest pain and palpitations.  Neurological:  Negative for tremors.  Psychiatric/Behavioral:  Positive for dysphoric mood. Negative for decreased concentration. The patient is nervous/anxious.     Medications: I have reviewed  the patient's current medications.  Current Outpatient Medications  Medication Sig Dispense Refill   clonazePAM (KLONOPIN) 0.5 MG tablet Take 1-1.5 tabs at  bedtime and 1/2-1 tab as needed for anxiety. 60 tablet 1   doxycycline (VIBRAMYCIN) 50 MG capsule Take 50 mg by mouth daily.     escitalopram (LEXAPRO) 20 MG tablet Take 1 tablet (20 mg total) by mouth daily. 30 tablet 2   Esketamine HCl, 84 MG Dose, (SPRAVATO, 84 MG DOSE,) 28 MG/DEVICE SOPK Place 84 mg into the nose every 3 (three) days. 3 each 5   estradiol (ESTRACE) 0.1 MG/GM vaginal cream USE 1 GM VAGINALLY TWICE A WEEK AT BEDTIME 42.5 g 0   gabapentin (NEURONTIN) 100 MG capsule Take 1 capsule (100 mg total) by mouth 3 (three) times daily as needed. Take 1 capsule three times daily 90 capsule 1   gabapentin (NEURONTIN) 300 MG capsule Take 1-2 capsules at bedtime 60 capsule 1   Lemborexant (DAYVIGO) 10 MG TABS Take 1/2-1 tab po QHS 30 tablet 2   MAGNESIUM PO Take by mouth.     methylphenidate 36 MG PO CR tablet Take 1 tablet (36 mg total) by mouth daily as needed. Do not take on Spravato treatment days. 30 tablet 0   Multiple Vitamin (MULTIVITAMIN) tablet Take 1 tablet by mouth daily.     traZODone (DESYREL) 100 MG tablet Take 1 tablet (100 mg total) by mouth at bedtime. Take 1-2 tabs po QHS prn 180 tablet 1   valACYclovir (VALTREX) 500 MG tablet Take one twice daily for 3 days with any symptoms 30 tablet 3   No current facility-administered medications for this visit.    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   TRANSAMINASES, SERUM, ELEVATED 08/14/2009    Past Medical History, Surgical history, Social history, and Family history were reviewed and updated as appropriate.   Please see review of systems for  further details on the patient's review from today.   Objective:   Physical Exam:  LMP 12/27/2007 (Exact Date)   Physical Exam Constitutional:      General: She is not in acute distress. Musculoskeletal:        General: No deformity.  Neurological:     Mental Status: She is alert and oriented to person, place, and time.     Coordination: Coordination normal.  Psychiatric:        Attention and Perception: Attention and perception normal. She does not perceive auditory or visual hallucinations.        Mood and Affect: Mood is anxious and depressed. Affect is blunt. Affect is not labile or angry.        Speech: Speech normal.        Behavior: Behavior normal.        Thought Content: Thought content normal. Thought content is not delusional. Thought content does not include homicidal or suicidal ideation. Thought content does not include suicidal plan.        Cognition and Memory: Cognition and memory normal.  Judgment: Judgment normal.     Comments: Insight intact Less intense depression by 45% less blunted     Lab Review:     Component Value Date/Time   NA 138 04/22/2022 0932   K 4.1 04/22/2022 0932   CL 98 04/22/2022 0932   CO2 31 04/22/2022 0932   GLUCOSE 102 (H) 04/22/2022 0932   BUN 10 04/22/2022 0932   CREATININE 0.71 04/22/2022 0932   CREATININE 0.68 01/26/2020 0920   CALCIUM 10.5 04/22/2022 0932   PROT 8.3 04/22/2022 0932   ALBUMIN 5.3 (H) 04/22/2022 0932   AST 82 (H) 04/22/2022 0932   ALT 118 (H) 04/22/2022 0932   ALKPHOS 74 04/22/2022 0932   BILITOT 0.3 04/22/2022 0932   GFRNONAA >90 05/01/2014 1645   GFRAA >90 05/01/2014 1645       Component Value Date/Time   WBC 3.3 (L) 04/22/2022 0932   RBC 4.25 04/22/2022 0932   HGB 14.3 04/22/2022 0932   HGB 14.1 10/26/2014 1034   HCT 40.7 04/22/2022 0932   PLT 245.0 04/22/2022 0932   MCV 95.7 04/22/2022 0932   MCH 34.4 (H) 01/26/2020 0920   MCHC 35.1 04/22/2022 0932   RDW 13.2 04/22/2022 0932    LYMPHSABS 0.9 04/22/2022 0932   MONOABS 0.2 04/22/2022 0932   EOSABS 0.1 04/22/2022 0932   BASOSABS 0.0 04/22/2022 0932    No results found for: "POCLITH", "LITHIUM"   No results found for: "PHENYTOIN", "PHENOBARB", "VALPROATE", "CBMZ"   .res Assessment: Plan:    Heather Davila was seen today for follow-up, depression and sleeping problem.  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  Patient was administered Spravato 84 mg intranasally today.  The patient experienced the typical dissociation which gradually resolved over the 2-hour period of observation.  There were no complications.  Specifically the patient did not have nausea or vomiting. But mild headache.  Blood pressures remained within normal ranges at the 40-minute and 2-hour follow-up intervals.  By the time the 2-hour observation period was met the patient was alert and oriented and able to exit without assistance.  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  See nursing note for further details.Tolerated the Spravato to '84mg'$  .  Regarding med options.  She has obviously been thoroughly tried on numerous medications.  The only antidepressant category that she has not taken is MAO inhibitors which are not compatible with Spravato.  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 Disc this with her previously.    Today she was a little less depressed and more hopeful.  Looks like starting to improve with Spravato.  Wants to continue the tx plan  Tolerating meds.  Satisfied  she is making progress No med changes today, working on sleep meds.  FU twice weekly Spravato until improvement maxes out then drop back to weekly.  Lynder Parents, MD, DFAPA    Please see After Visit Summary for patient specific instructions.  Future Appointments  Date Time Provider Tice  06/26/2022  8:00 AM CP-NURSE [facility] None  06/26/2022  9:30 AM Cottle, Billey Co., MD CP-CP None  06/30/2022  2:00  PM Cottle, Billey Co., MD CP-CP None  06/30/2022  2:00 PM CP-NURSE CP-CP None  07/01/2022  9:00 AM [name], PMHNP CP-CP None  08/18/2022  2:35 PM Megan Salon, MD [facility]    No orders of the defined types were placed in this encounter.   ------------------------------- T

## 2022-06-25 ENCOUNTER — Other Ambulatory Visit: Payer: Self-pay | Admitting: Psychiatry

## 2022-06-25 ENCOUNTER — Encounter: Payer: Self-pay | Admitting: Psychiatry

## 2022-06-25 ENCOUNTER — Ambulatory Visit: Payer: 59

## 2022-06-25 ENCOUNTER — Ambulatory Visit (INDEPENDENT_AMBULATORY_CARE_PROVIDER_SITE_OTHER): Payer: 59 | Admitting: Psychiatry

## 2022-06-25 VITALS — BP 102/74 | HR 63

## 2022-06-25 DIAGNOSIS — F339 Major depressive disorder, recurrent, unspecified: Secondary | ICD-10-CM

## 2022-06-25 DIAGNOSIS — G4700 Insomnia, unspecified: Secondary | ICD-10-CM

## 2022-06-25 DIAGNOSIS — F900 Attention-deficit hyperactivity disorder, predominantly inattentive type: Secondary | ICD-10-CM

## 2022-06-25 DIAGNOSIS — F411 Generalized anxiety disorder: Secondary | ICD-10-CM

## 2022-06-25 MED ORDER — METHYLPHENIDATE HCL ER (OSM) 27 MG PO TBCR: 27.0000 mg | EXTENDED_RELEASE_TABLET | ORAL | 0 refills | Status: DC

## 2022-06-25 NOTE — Telephone Encounter (Signed)
Please let her know a script has been sent for 27 mg dose.

## 2022-06-25 NOTE — Telephone Encounter (Signed)
Please send, I will keep instructions the same

## 2022-06-25 NOTE — Progress Notes (Signed)
Heather Davila:[id number] [birth year] 62 y.o.  Subjective:   Patient ID:  Heather Davila is a 62 y.o. (DOB 1961-02-12) female.  Chief Complaint:  Chief Complaint  Patient presents with   Follow-up   Depression   Anxiety   Fatigue    HPI Tanelle Hausen presents to the office today for follow-up of treatment resistant recurrent depression, generalized anxiety disorder and insomnia.  Almost too numerous to count med failures. She is here for Spravato administration for her treatment resistant depression.  05/21/22 appt noted: Patient was administered first dose Spravato 56 mg intranasally today.  The patient experienced the typical dissociation which gradually resolved over the 2-hour period of observation.  There were no complications.  Specifically the patient did not have nausea or vomiting or headache.   Dissociation was mild. Did not experience any emotional relief from disabling depression.wants to increase Spravato to the usual dose.  She is aware insurance is not covering the costs at this time.  No SI acutely. Tolerating meds.    05/23/22 appt noted: Patient was administered Spravato 84 mg intranasally today.  The patient experienced the typical dissociation which gradually resolved over the 2-hour period of observation.  There were no complications.  Specifically the patient did not have nausea or vomiting.   Dissociation was greater than last dose and a little bothersome DT some HA. Tolerating meds.   Mood has not changed significantly thus far with Spravato.  05/27/22 appt noted: Patient was administered Spravato 84 mg intranasally today.  The patient experienced the typical dissociation which gradually resolved over the 2-hour period of observation.  There were no complications.  Specifically the patient did not have nausea or vomiting or headache. Today she has felt a little relief from the pressing heaviness of depression but a little more anxiety.  During administration of  Spravato she listens to [place] and was able to relax a little more with the feeling of dissociation that was mildly bothersome. Husband was also in on the session today and asked some questions about IV ketamine versus nasal spray ketamine.  05/29/22 appt noted: Cancelled today DT hypertension  05/30/22 appt noted: Patient was administered Spravato 84 mg intranasally today.  The patient experienced the typical dissociation which gradually resolved over the 2-hour period of observation.  There were no complications.  Specifically the patient did not have nausea or vomiting or headache. She is seeing some improvement in mood with less continuous depression and less intensity.  Hopefulness is better.   No med complaints or SE  06/05/22 appt noted: Patient was administered Spravato 84 mg intranasally today.  The patient experienced the typical dissociation which gradually resolved over the 2-hour period of observation.  There were no complications.  Specifically the patient did not have nausea or vomiting or headache.  Specifically HA is less of a problem with Spravato. No SE with meds. Depression is improving with less intensity.  Intermittent anxiety easily.  More able to enjoy things.  No new concerns.  06/17/22 appt noted: Patient was administered Spravato 84 mg intranasally today.  The patient experienced the typical dissociation which gradually resolved over the 2-hour period of observation.  There were no complications.  Specifically the patient did not have nausea or vomiting or headache.  Specifically HA is less of a problem with Spravato. No SE with meds. Depression is improved 45% with less intensity.  Intermittent anxiety less easily.  More able to enjoy things.  No new concerns.  More active. Current meds: increased  clonazepam to about 1.5 mg HS DT recent insomnia, lexapro 20, Dayvigo 10 mg HS, concerta 36 mg AM, trazodone 100 mg HS.  06/20/22 appt noted: Current meds: increased  clonazepam to about 1.5 mg HS DT recent insomnia, lexapro 20, Dayvigo 10 mg HS, concerta 36 mg AM, trazodone 100 mg HS. Patient was administered Spravato 84 mg intranasally today.  The patient experienced the typical dissociation which gradually resolved over the 2-hour period of observation.  There were no complications.  Specifically the patient did not have nausea or vomiting or headache.  Specifically HA is less of a problem with Spravato. No SE with meds. Depression is improved 45% with less intensity.  Intermittent anxiety less easily.  More able to enjoy things.  No new concerns.  More active.  06/23/22 appt noted:  Current meds: increased clonazepam to about 1.5 mg HS DT recent insomnia, lexapro 20, Dayvigo 10 mg HS, concerta 36 mg AM, trazodone 100 mg HS. Patient was administered Spravato 84 mg intranasally today.  The patient experienced the typical dissociation which gradually resolved over the 2-hour period of observation.  There were no complications.  Specifically the patient did not have nausea or vomiting or headache.  Specifically HA is less of a problem with Spravato. No SE with meds. Intensity of Spravato dissociation varies.  Last week very intesne and this week more typical.  Depression is continuing to improve with better interest and activity and motivation.  Gradual improvement.  Wants to continue.  06/25/22 appt noted: Current meds: increased clonazepam to about 1.5 mg HS DT recent insomnia, lexapro 20, Dayvigo 10 mg HS, concerta 36 mg AM, trazodone 100 mg HS. Patient was administered Spravato 84 mg intranasally today.  The patient experienced the typical dissociation which gradually resolved over the 2-hour period of observation.  There were no complications.  Specifically the patient did not have nausea or vomiting or headache.  Specifically HA is less of a problem with Spravato. No SE with meds. Intensity of Spravato dissociation varies.  No scary and well  tolerated. Continues to gradually improve with Spravato.  She is more motivated and enjoying things more.  H sees progress.  Wants to continue twice weekly until gets maximum response.  Dep is not gone yet.  Past Psychiatric Medication Trials: Trintellix - Initially effective and then was not effective when re-started Paxil- Ineffective. Helped initially. Prozac-Insomnia Lexapro- Effective and then no longer as effective Zoloft- Initially effective and then not as effective.  Viibryd Cymbalta- Ineffective. Helped initially. Effexor XR- Effective, well tolerated. Pristiq- Increased anxiety at 150 mg daily Wellbutrin XL-Ineffective.May have increased anxiety. Amitriptyline Nortriptyline-Caused irritability, constipation Auvelity- ineffective Abilify- Helpful but caused severe insomnia. Rexulti-Helpful but caused insomnia. Vraylar Seroquel - Ineffective Risperdal Olanzapine- Ineffective Latuda- Ineffective Caplyta Klonopin- Effective Temazepam- Took during menopause Lunesta- Ineffective Ambien-Ineffective Sonata- Ineffective Dayvigo- partially effective Trazodone- Ineffective Remeron-Ineffective. Does not recall wt gain.  Adderall- Took once and had adverse effect. Concerta- Effective Metadate-Not as effective as Concerta Dexmethylphenidate- Not as effective as Concerta Azstarys- some side effects. Not as effective.  Lamictal- May have had an adverse effects. "Felt weird." Reports worsening s/s.  Lithium Gabapentin Pramipexole- "felt really weird."   ECT #20 with minimal response. H says it was partly helpful.  AIMS    Flowsheet Row Video Visit from 01/03/2022 in [place]  AIMS Total Score 0      ECT-MADRS    Flowsheet Row Video Visit from 04/29/2022 in [facility] Video  Visit from 02/06/2022 in [place] Total Score 44 23      PHQ2-9    Flowsheet Row Video Visit  from 02/06/2022 in [place] Office Visit from 11/16/2020 in [facility] [place] Visit from 04/19/2018 in [place] at [facility] Total Score 6 0 0  PHQ-9 Total Score 14 -- 2        Review of Systems:  Review of Systems  Constitutional:  Positive for fatigue.  Cardiovascular:  Negative for palpitations.  Neurological:  Negative for tremors.  Psychiatric/Behavioral:  Positive for dysphoric mood. Negative for decreased concentration. The patient is nervous/anxious.     Medications: I have reviewed the patient's current medications.  Current Outpatient Medications  Medication Sig Dispense Refill   clonazePAM (KLONOPIN) 0.5 MG tablet Take 1-1.5 tabs at bedtime and 1/2-1 tab as needed for anxiety. 60 tablet 1   doxycycline (VIBRAMYCIN) 50 MG capsule Take 50 mg by mouth daily.     escitalopram (LEXAPRO) 20 MG tablet Take 1 tablet (20 mg total) by mouth daily. 30 tablet 2   estradiol (ESTRACE) 0.1 MG/GM vaginal cream USE 1 GM VAGINALLY TWICE A WEEK AT BEDTIME 42.5 g 0   gabapentin (NEURONTIN) 100 MG capsule Take 1 capsule (100 mg total) by mouth 3 (three) times daily as needed. Take 1 capsule three times daily 90 capsule 1   gabapentin (NEURONTIN) 300 MG capsule Take 1-2 capsules at bedtime 60 capsule 1   Lemborexant (DAYVIGO) 10 MG TABS Take 1/2-1 tab po QHS 30 tablet 2   MAGNESIUM PO Take by mouth.     methylphenidate (CONCERTA) 27 MG PO CR tablet Take 1 tablet (27 mg total) by mouth every morning. 30 tablet 0   Multiple Vitamin (MULTIVITAMIN) tablet Take 1 tablet by mouth daily.     SPRAVATO, 84 MG DOSE, 28 MG/DEVICE SOPK USE 3 SPRAY IN EACH NOSTRIL EVERY 3 (THREE) DAYS. 3 each 5   traZODone (DESYREL) 100 MG tablet Take 1 tablet (100 mg total) by mouth at bedtime. Take 1-2 tabs po QHS prn 180 tablet 1   valACYclovir (VALTREX) 500 MG tablet Take one twice daily for 3 days with any  symptoms 30 tablet 3   No current facility-administered medications for this visit.    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   TRANSAMINASES, SERUM, ELEVATED 08/14/2009    Past Medical History, Surgical history, Social history, and Family history were reviewed and updated as appropriate.   Please see review of systems for further details on the patient's review from today.   Objective:   Physical Exam:  LMP 12/27/2007 (Exact Date)   Physical Exam Constitutional:      General: She is not in acute distress. Musculoskeletal:        General: No deformity.  Neurological:     Mental Status: She is alert and oriented to person, place, and time.     Coordination: Coordination normal.  Psychiatric:        Attention and Perception: Attention and perception normal. She does not perceive auditory or visual hallucinations.        Mood and Affect: Mood is anxious and depressed. Affect is blunt. Affect is not labile or angry.        Speech: Speech normal.        Behavior: Behavior normal.        Thought Content: Thought content normal. Thought content is not delusional. Thought content does not include homicidal or suicidal ideation. Thought content does not include suicidal plan.        Cognition and Memory: Cognition and memory normal.        Judgment: Judgment normal.     Comments: Insight intact Less intense depression by 50% less blunted     Lab Review:     Component Value Date/Time   NA 138 04/22/2022 0932   K 4.1 04/22/2022 0932   CL 98 04/22/2022 0932   CO2 31 04/22/2022 0932   GLUCOSE 102  (H) 04/22/2022 0932   BUN 10 04/22/2022 0932   CREATININE 0.71 04/22/2022 0932   CREATININE 0.68 01/26/2020 0920   CALCIUM 10.5 04/22/2022 0932   PROT 8.3 04/22/2022 0932   ALBUMIN 5.3 (H) 04/22/2022 0932   AST 82 (H) 04/22/2022 0932   ALT 118 (H) 04/22/2022 0932   ALKPHOS 74 04/22/2022 0932   BILITOT 0.3 04/22/2022 0932   GFRNONAA >90 05/01/2014 1645   GFRAA >90 05/01/2014 1645       Component Value Date/Time   WBC 3.3 (L) 04/22/2022 0932   RBC 4.25 04/22/2022 0932   HGB 14.3 04/22/2022 0932   HGB 14.1 10/26/2014 1034   HCT 40.7 04/22/2022 0932   PLT 245.0 04/22/2022 0932   MCV 95.7 04/22/2022 0932   MCH 34.4 (H) 01/26/2020 0920   MCHC 35.1 04/22/2022 0932   RDW 13.2 04/22/2022 0932   LYMPHSABS 0.9 04/22/2022 0932   MONOABS 0.2 04/22/2022 0932   EOSABS 0.1 04/22/2022 0932   BASOSABS 0.0 04/22/2022 0932    No results found for: "POCLITH", "LITHIUM"   No results found for: "PHENYTOIN", "PHENOBARB", "VALPROATE", "CBMZ"   .res Assessment: Plan:    Madelena was seen today for follow-up, depression, anxiety and fatigue.  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  Patient was administered Spravato 84 mg intranasally today.  The patient experienced the typical dissociation which gradually resolved over the 2-hour period of observation.  There were no complications.  Specifically the patient did not have nausea or vomiting. But mild headache.  Blood pressures remained within normal ranges at the 40-minute and 2-hour follow-up intervals.  By the time the 2-hour observation period was met the patient was alert and oriented and able to exit without assistance.  Patient feels the Spravato administration is helpful for the treatment  resistant depression and would like to continue the treatment.  See nursing note for further details.Tolerated the Spravato to '84mg'$  .  Regarding med options.  She has obviously been thoroughly tried on numerous medications.  The only antidepressant category that she has not taken is MAO inhibitors which are not compatible with Spravato.  The only other options with reasonable chance of success might be to use the most successful antidepressants at higher than  recommended maximum dose for treatment resistant status. Disc this with her previously.    Today she was a little less depressed and more hopeful.  Looks like starting to improve with Spravato.  Wants to continue the tx plan  Tolerating meds.  Satisfied she is making progress No med changes today  FU twice weekly Spravato until improvement maxes out then drop back to weekly.  Lynder Parents, MD, DFAPA    Please see After Visit Summary for patient specific instructions.  Future Appointments  Date Time Provider Sanders  06/30/2022  2:00 PM Cottle, Billey Co., MD CP-CP None  06/30/2022  2:00 PM CP-NURSE CP-CP None  07/01/2022  9:00 AM [name], PMHNP CP-CP None  08/18/2022  2:35 PM Megan Salon, MD [facility]    No orders of the defined types were placed in this encounter.   ------------------------------- T

## 2022-06-25 NOTE — Progress Notes (Signed)
NURSES NOTE:     Patient arrived for her 45 th Spravato treatment. Pt is being treated for Treatment Resistant Depression, the starting dose is 56 mg (2 of the 28 mg) nasal sprays. Pt will be receiving 84 mg, this is her maintenance dose. Patient taken to treatment room, I explained and discussed her treatment and how the schedule should go, along with any side effects that may occur. Answered any questions and concerns the patient had. Pt's Spravato is delivered through [place] and she is now billing with her insurance. Spravato medication is stored at doctors office per REMS/FDA guidelines. The medication is required to be locked behind two doors per FDA/REMS Protocol. Medication is also disposed of properly per regulations.      Vital Signs assessed at 8:05 AM 113/67, pulse 58, pulse ox 92%. Instructed pt to blow her nose and recline back slightly to prevent any of medication dripping out of her nose. Pt. Given 1st dose (28 mg inhaler), then 5 minutes between 2nd dose and 3rd dose. No complaints of nausea/vomiting reported. Pt did listen to music today, she just reclined back and closed her eyes. After 40 minutes I went to assess her vital signs, no side effects or symptoms reported, B/P 129/79, pulse 57. Informed pt she could rest in the chair Dr. Clovis Pu came in to discuss her treatment at the end. She had no questions/concerns, Nurse was with pt a total of 60 minutes but pt observed for 120 minutes per protocol. Discharge vital signs were at 10:09 AM, B/P 102/74, pulse 63. Pt denies a headache. She reports dissociation she was clear upon her discharge, she left with her husband. She scheduled her next apt Monday afternoon at 2:00 PM. Pt is doing more and getting out of house more. Pt feeling much better.   LOT # [id number] EXP JAN 2027

## 2022-06-25 NOTE — Telephone Encounter (Signed)
Patient says her BP is better. They are better on Spravato days but Tressia Miners said she holds Concerta on those days. She feels like she just needs a lower dose of Concerta.

## 2022-06-26 ENCOUNTER — Encounter: Payer: 59 | Admitting: Psychiatry

## 2022-06-26 ENCOUNTER — Encounter: Payer: Self-pay | Admitting: Physician Assistant

## 2022-06-28 NOTE — Progress Notes (Signed)
NURSES NOTE:     Patient arrived for her 68th Spravato treatment. Pt is being treated for Treatment Resistant Depression, the starting dose is 56 mg (2 of the 28 mg) nasal sprays. Pt will be receiving 84 mg, this is her maintenance dose. Patient taken to treatment room, I explained and discussed her treatment and how the schedule should go, along with any side effects that may occur. Answered any questions and concerns the patient had. Pt's Spravato is delivered through [place] and she is now billing with her insurance. Spravato medication is stored at doctors office per REMS/FDA guidelines. The medication is required to be locked behind two doors per FDA/REMS Protocol. Medication is also disposed of properly per regulations.      Vital Signs assessed at 8:40 AM 124/75, pulse 73, pulse ox 92%. Instructed pt to blow her nose and recline back slightly to prevent any of medication dripping out of her nose. Pt. Given 1st dose (28 mg inhaler), then 5 minutes between 2nd dose and 3rd dose. No complaints of nausea/vomiting reported. Pt did listen to music today, she just reclined back and closed her eyes. After 40 minutes I went to assess her vital signs, no side effects or symptoms reported, B/P 127/71, pulse 59. Informed pt she could rest in the chair Dr. Clovis Pu came in to discuss her treatment at the end. She had no questions/concerns, Nurse was with pt a total of 60 minutes but pt observed for 120 minutes per protocol. Discharge vital signs were at 10:40 AM, B/P 126/75, pulse 59. Pt denies a headache. She reports dissociation she was clear upon her discharge, she left with her husband. She scheduled her next apt February 13th. Pt is doing more and getting out of house more. Pt feeling much better.   LOT # '[id number] EXP JAN 2027

## 2022-06-30 ENCOUNTER — Encounter: Payer: Self-pay | Admitting: Psychiatry

## 2022-06-30 ENCOUNTER — Ambulatory Visit: Payer: 59

## 2022-06-30 ENCOUNTER — Ambulatory Visit (INDEPENDENT_AMBULATORY_CARE_PROVIDER_SITE_OTHER): Payer: 59 | Admitting: Psychiatry

## 2022-06-30 VITALS — BP 115/70 | HR 66

## 2022-06-30 DIAGNOSIS — G4700 Insomnia, unspecified: Secondary | ICD-10-CM | POA: Diagnosis not present

## 2022-06-30 DIAGNOSIS — F900 Attention-deficit hyperactivity disorder, predominantly inattentive type: Secondary | ICD-10-CM

## 2022-06-30 DIAGNOSIS — F339 Major depressive disorder, recurrent, unspecified: Secondary | ICD-10-CM

## 2022-06-30 DIAGNOSIS — F411 Generalized anxiety disorder: Secondary | ICD-10-CM

## 2022-06-30 NOTE — Progress Notes (Signed)
Verdis Granat [id number] Apr 08, 1961 62 y.o.  Subjective:   Patient ID:  Heather Davila is a 62 y.o. (DOB [birth year]) female.  Chief Complaint:  Chief Complaint  Patient presents with   Follow-up   Depression   Fatigue    HPI Heather Davila presents to the office today for follow-up of treatment resistant recurrent depression, generalized anxiety disorder and insomnia.  Almost too numerous to count med failures. She is here for Spravato administration for her treatment resistant depression.  05/21/22 appt noted: Patient was administered first dose Spravato 56 mg intranasally today.  The patient experienced the typical dissociation which gradually resolved over the 2-hour period of observation.  There were no complications.  Specifically the patient did not have nausea or vomiting or headache.   Dissociation was mild. Did not experience any emotional relief from disabling depression.wants to increase Spravato to the usual dose.  She is aware insurance is not covering the costs at this time.  No SI acutely. Tolerating meds.    05/23/22 appt noted: Patient was administered Spravato 84 mg intranasally today.  The patient experienced the typical dissociation which gradually resolved over the 2-hour period of observation.  There were no complications.  Specifically the patient did not have nausea or vomiting.   Dissociation was greater than last dose and a little bothersome DT some HA. Tolerating meds.   Mood has not changed significantly thus far with Spravato.  05/27/22 appt noted: Patient was administered Spravato 84 mg intranasally today.  The patient experienced the typical dissociation which gradually resolved over the 2-hour period of observation.  There were no complications.  Specifically the patient did not have nausea or vomiting or headache. Today she has felt a little relief from the pressing heaviness of depression but a little more anxiety.  During administration of Spravato she  listens to [place] and was able to relax a little more with the feeling of dissociation that was mildly bothersome. Husband was also in on the session today and asked some questions about IV ketamine versus nasal spray ketamine.  05/29/22 appt noted: Cancelled today DT hypertension  05/30/22 appt noted: Patient was administered Spravato 84 mg intranasally today.  The patient experienced the typical dissociation which gradually resolved over the 2-hour period of observation.  There were no complications.  Specifically the patient did not have nausea or vomiting or headache. She is seeing some improvement in mood with less continuous depression and less intensity.  Hopefulness is better.   No med complaints or SE  06/05/22 appt noted: Patient was administered Spravato 84 mg intranasally today.  The patient experienced the typical dissociation which gradually resolved over the 2-hour period of observation.  There were no complications.  Specifically the patient did not have nausea or vomiting or headache.  Specifically HA is less of a problem with Spravato. No SE with meds. Depression is improving with less intensity.  Intermittent anxiety easily.  More able to enjoy things.  No new concerns.  06/17/22 appt noted: Patient was administered Spravato 84 mg intranasally today.  The patient experienced the typical dissociation which gradually resolved over the 2-hour period of observation.  There were no complications.  Specifically the patient did not have nausea or vomiting or headache.  Specifically HA is less of a problem with Spravato. No SE with meds. Depression is improved 45% with less intensity.  Intermittent anxiety less easily.  More able to enjoy things.  No new concerns.  More active. Current meds: increased clonazepam to about  1.5 mg HS DT recent insomnia, lexapro 20, Dayvigo 10 mg HS, concerta 36 mg AM, trazodone 100 mg HS.  06/20/22 appt noted: Current meds: increased clonazepam to  about 1.5 mg HS DT recent insomnia, lexapro 20, Dayvigo 10 mg HS, concerta 36 mg AM, trazodone 100 mg HS. Patient was administered Spravato 84 mg intranasally today.  The patient experienced the typical dissociation which gradually resolved over the 2-hour period of observation.  There were no complications.  Specifically the patient did not have nausea or vomiting or headache.  Specifically HA is less of a problem with Spravato. No SE with meds. Depression is improved 45% with less intensity.  Intermittent anxiety less easily.  More able to enjoy things.  No new concerns.  More active.  06/23/22 appt noted:  Current meds: increased clonazepam to about 1.5 mg HS DT recent insomnia, lexapro 20, Dayvigo 10 mg HS, concerta 36 mg AM, trazodone 100 mg HS. Patient was administered Spravato 84 mg intranasally today.  The patient experienced the typical dissociation which gradually resolved over the 2-hour period of observation.  There were no complications.  Specifically the patient did not have nausea or vomiting or headache.  Specifically HA is less of a problem with Spravato. No SE with meds. Intensity of Spravato dissociation varies.  Last week very intesne and this week more typical.  Depression is continuing to improve with better interest and activity and motivation.  Gradual improvement.  Wants to continue.  06/25/22 appt noted: Current meds: increased clonazepam to about 1.5 mg HS DT recent insomnia, lexapro 20, Dayvigo 10 mg HS, concerta 36 mg AM, trazodone 100 mg HS. Patient was administered Spravato 84 mg intranasally today.  The patient experienced the typical dissociation which gradually resolved over the 2-hour period of observation.  There were no complications.  Specifically the patient did not have nausea or vomiting or headache.  Specifically HA is less of a problem with Spravato. No SE with meds. Intensity of Spravato dissociation varies.  No scary and well tolerated. Continues to  gradually improve with Spravato.  She is more motivated and enjoying things more.  H sees progress.  Wants to continue twice weekly until gets maximum response.  Dep is not gone yet.  06/30/22 appt noted:  seen with H Current meds: increased clonazepam to about 1.5 mg HS DT recent insomnia, lexapro 20, Dayvigo 10 mg HS, concerta 36 mg AM, trazodone 100 mg HS. Patient was administered Spravato 84 mg intranasally today.  The patient experienced the typical dissociation which gradually resolved over the 2-hour period of observation.  There were no complications.  Specifically the patient did not have nausea or vomiting or headache.  Specifically HA is less of a problem with Spravato. No SE with meds. Intensity of Spravato dissociation varies.  No scary and well tolerated. Continues to gradually improve with Spravato.  She is more motivated and enjoying things more.  H sees even more progress than she does; more active and smiling.  Wants to continue twice weekly until gets maximum response.  Dep is 80% better.  Past Psychiatric Medication Trials: Trintellix - Initially effective and then was not effective when re-started Paxil- Ineffective. Helped initially. Prozac-Insomnia Lexapro- Effective and then no longer as effective Zoloft- Initially effective and then not as effective.  Viibryd Cymbalta- Ineffective. Helped initially. Effexor XR- Effective, well tolerated. Pristiq- Increased anxiety at 150 mg daily Wellbutrin XL-Ineffective.May have increased anxiety. Amitriptyline Nortriptyline-Caused irritability, constipation Auvelity- ineffective Abilify- Helpful but caused severe insomnia. Rexulti-Helpful  but caused insomnia. Vraylar Seroquel - Ineffective Risperdal Olanzapine- Ineffective Latuda- Ineffective Caplyta Klonopin- Effective Temazepam- Took during menopause Lunesta- Ineffective Ambien-Ineffective Sonata- Ineffective Dayvigo- partially effective Trazodone-  Ineffective Remeron-Ineffective. Does not recall wt gain.  Adderall- Took once and had adverse effect. Concerta- Effective Metadate-Not as effective as Concerta Dexmethylphenidate- Not as effective as Concerta Azstarys- some side effects. Not as effective.  Lamictal- May have had an adverse effects. "Felt weird." Reports worsening s/s.  Lithium Gabapentin Pramipexole- "felt really weird."   ECT #20 with minimal response. H says it was partly helpful.  AIMS    Flowsheet Row Video Visit from 01/03/2022 in [place] Total Score 0      ECT-MADRS    Flowsheet Row Video Visit from 04/29/2022 in [facility] Video Visit from 02/06/2022 in [facility]  MADRS Total Score 44 23      PHQ2-9    Flowsheet Row Video Visit from 02/06/2022 in [place] Office Visit from 11/16/2020 in [facility] [place] Visit from 04/19/2018 in [place] at [facility] Total Score 6 0 0  PHQ-9 Total Score 14 -- 2        Review of Systems:  Review of Systems  Constitutional:  Negative for fatigue.  Cardiovascular:  Negative for palpitations.  Neurological:  Negative for tremors.  Psychiatric/Behavioral:  Positive for dysphoric mood. Negative for decreased concentration. The patient is not nervous/anxious.     Medications: I have reviewed the patient's current medications.  Current Outpatient Medications  Medication Sig Dispense Refill   clonazePAM (KLONOPIN) 0.5 MG tablet Take 1-1.5 tabs at bedtime and 1/2-1 tab as needed for anxiety. 60 tablet 1   doxycycline (VIBRAMYCIN) 50 MG capsule Take 50 mg by mouth daily.     escitalopram (LEXAPRO) 20 MG tablet Take 1 tablet (20 mg total) by mouth daily. 30 tablet 2   estradiol (ESTRACE) 0.1 MG/GM vaginal cream USE 1 GM VAGINALLY TWICE A WEEK AT BEDTIME 42.5 g 0    gabapentin (NEURONTIN) 100 MG capsule Take 1 capsule (100 mg total) by mouth 3 (three) times daily as needed. Take 1 capsule three times daily 90 capsule 1   gabapentin (NEURONTIN) 300 MG capsule Take 1-2 capsules at bedtime 60 capsule 1   Lemborexant (DAYVIGO) 10 MG TABS Take 1/2-1 tab po QHS 30 tablet 2   MAGNESIUM PO Take by mouth.     methylphenidate (CONCERTA) 27 MG PO CR tablet Take 1 tablet (27 mg total) by mouth every morning. 30 tablet 0   Multiple Vitamin (MULTIVITAMIN) tablet Take 1 tablet by mouth daily.     SPRAVATO, 84 MG DOSE, 28 MG/DEVICE SOPK USE 3 SPRAY IN EACH NOSTRIL EVERY 3 (THREE) DAYS. 3 each 5   traZODone (DESYREL) 100 MG tablet Take 1 tablet (100 mg total) by mouth at bedtime. Take 1-2 tabs po QHS prn 180 tablet 1   valACYclovir (VALTREX) 500 MG tablet Take one twice daily for 3 days with any symptoms 30 tablet 3   No current facility-administered medications for this visit.    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   TRANSAMINASES, SERUM, ELEVATED 08/14/2009    Past Medical History, Surgical history, Social history, and Family history were reviewed and updated as appropriate.   Please see review of systems for further details on the patient's review from today.   Objective:   Physical Exam:  LMP 12/27/2007 (Exact Date)   Physical Exam Constitutional:      General: She is not in acute distress. Musculoskeletal:        General: No deformity.  Neurological:     Mental Status: She is alert and oriented to person, place, and time.     Coordination: Coordination normal.   Psychiatric:        Attention and Perception: Attention and perception normal. She does not perceive auditory or visual hallucinations.        Mood and Affect: Mood is anxious and depressed. Affect is not labile, blunt or angry.        Speech: Speech normal.        Behavior: Behavior normal.        Thought Content: Thought content normal. Thought content is not delusional. Thought content does not include homicidal or suicidal ideation. Thought content does not include suicidal plan.        Cognition and Memory: Cognition and memory normal.        Judgment: Judgment normal.     Comments: Insight intact Less intense depression by 85%  Some smiling     Lab Review:     Component Value Date/Time   NA 138 04/22/2022 0932   K 4.1 04/22/2022 0932   CL 98 04/22/2022 0932   CO2 31 04/22/2022 0932   GLUCOSE 102 (H) 04/22/2022 0932   BUN 10 04/22/2022 0932   CREATININE 0.71 04/22/2022 0932   CREATININE 0.68 01/26/2020 0920   CALCIUM 10.5 04/22/2022 0932   PROT 8.3 04/22/2022 0932   ALBUMIN 5.3 (H) 04/22/2022 0932   AST 82 (H) 04/22/2022 0932   ALT 118 (H) 04/22/2022 0932   ALKPHOS 74 04/22/2022 0932   BILITOT 0.3 04/22/2022 0932   GFRNONAA >90 05/01/2014 1645   GFRAA >90 05/01/2014 1645       Component Value Date/Time   WBC 3.3 (L) 04/22/2022 0932   RBC 4.25 04/22/2022 0932   HGB 14.3 04/22/2022 0932   HGB 14.1 10/26/2014 1034   HCT 40.7 04/22/2022 0932   PLT 245.0 04/22/2022 0932   MCV 95.7 04/22/2022 0932   MCH 34.4 (H) 01/26/2020 0920   MCHC 35.1 04/22/2022 0932   RDW 13.2 04/22/2022 0932   LYMPHSABS 0.9 04/22/2022 0932   MONOABS 0.2 04/22/2022 0932   EOSABS 0.1 04/22/2022 0932   BASOSABS 0.0 04/22/2022 0932    No results found for: "POCLITH", "LITHIUM"   No results found for: "PHENYTOIN", "PHENOBARB", "VALPROATE", "CBMZ"   .res Assessment: Plan:    Heather Davila was seen today for follow-up, depression and fatigue.  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  Patient was administered Spravato 84 mg intranasally today.  The patient experienced the typical dissociation which gradually resolved over the 2-hour period of observation.  There were no complications.  Specifically the patient did not have nausea or vomiting. But mild headache.  Blood pressures remained within normal ranges at the 40-minute and 2-hour follow-up intervals.  By the time the 2-hour observation period was met the patient was alert and oriented and able to exit without assistance.  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  See nursing note for further details.Tolerated the Spravato to '84mg'$  .  Regarding med options.  She has obviously been thoroughly tried on numerous medications.  The only antidepressant category that she has not taken is MAO inhibitors which are not compatible with Spravato.  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 Disc this with her previously.    Today she was 85% less depressed and more hopeful.  She and H very pleased. Wants to continue the tx plan  Tolerating meds.  Satisfied she is making progress No med changes today  FU twice weekly Spravato until improvement maxes out then drop back to weekly.  Lynder Parents, MD, DFAPA    Please see After Visit Summary for patient specific instructions.  Future Appointments  Date Time Provider [place]  07/01/2022  9:00 AM [name], PMHNP [facility] None  07/02/2022  9:00 AM Cottle, Billey Co., MD CP-CP None  07/02/2022   9:00 AM CP-NURSE CP-CP None  08/18/2022  2:35 PM Megan Salon, MD [facility]    No orders of the defined types were placed in this encounter.   ------------------------------- T

## 2022-06-30 NOTE — Progress Notes (Signed)
NURSES NOTE:     Patient arrived for her 13th Spravato treatment. Pt is being treated for Treatment Resistant Depression, the starting dose is 56 mg (2 of the 28 mg) nasal sprays. Pt will be receiving 84 mg, this is her maintenance dose. Patient taken to treatment room, I explained and discussed her treatment and how the schedule should go, along with any side effects that may occur. Answered any questions and concerns the patient had. Pt's Spravato is delivered through [place] and she is now billing with her insurance. Spravato medication is stored at doctors office per REMS/FDA guidelines. The medication is required to be locked behind two doors per FDA/REMS Protocol. Medication is also disposed of properly per regulations.      Vital Signs assessed at 2:15 PM 100/61, pulse 56, pulse ox 92%. Instructed pt to blow her nose and recline back slightly to prevent any of medication dripping out of her nose. Pt. Given 1st dose (28 mg inhaler), then 5 minutes between 2nd dose and 3rd dose. No complaints of nausea/vomiting reported. Pt did listen to music today, she just reclined back and closed her eyes. After 40 minutes I went to assess her vital signs, no side effects or symptoms reported, B/P 126/73, pulse 65. Informed pt could rest in the chair Dr. Clovis Pu came in to discuss her treatment at the end. She had no questions/concerns, Nurse was with pt a total of 60 minutes but pt observed for 120 minutes per protocol. Discharge vital signs were at 4:00 PM, B/P 115/70, pulse 66. She reports dissociation she was clear upon her discharge, she left with her husband. She scheduled her next apt Wednesday morning, March 6th. Pt is doing more and getting out of house more. Pt feeling much better.   LOT # [id number] EXP FEB 2027

## 2022-07-01 ENCOUNTER — Encounter: Payer: Self-pay | Admitting: Psychiatry

## 2022-07-01 ENCOUNTER — Telehealth (INDEPENDENT_AMBULATORY_CARE_PROVIDER_SITE_OTHER): Payer: 59 | Admitting: Psychiatry

## 2022-07-01 DIAGNOSIS — F339 Major depressive disorder, recurrent, unspecified: Secondary | ICD-10-CM

## 2022-07-01 DIAGNOSIS — F419 Anxiety disorder, unspecified: Secondary | ICD-10-CM

## 2022-07-01 DIAGNOSIS — G4700 Insomnia, unspecified: Secondary | ICD-10-CM

## 2022-07-01 MED ORDER — ESCITALOPRAM OXALATE 20 MG PO TABS: ORAL_TABLET | ORAL | 2 refills | Status: DC

## 2022-07-01 MED ORDER — CLONAZEPAM 0.5 MG PO TABS: ORAL_TABLET | ORAL | 1 refills | Status: DC

## 2022-07-01 MED ORDER — SERTRALINE HCL 100 MG PO TABS: ORAL_TABLET | ORAL | 1 refills | Status: DC

## 2022-07-01 MED ORDER — GABAPENTIN 800 MG PO TABS: 800.0000 mg | ORAL_TABLET | Freq: Every day | ORAL | 1 refills | Status: DC
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
## 2022-07-02 ENCOUNTER — Ambulatory Visit: Payer: 59

## 2022-07-02 ENCOUNTER — Ambulatory Visit (INDEPENDENT_AMBULATORY_CARE_PROVIDER_SITE_OTHER): Payer: 59 | Admitting: Psychiatry

## 2022-07-02 ENCOUNTER — Encounter: Payer: Self-pay | Admitting: Psychiatry

## 2022-07-02 VITALS — BP 89/63 | HR 59

## 2022-07-02 DIAGNOSIS — F339 Major depressive disorder, recurrent, unspecified: Secondary | ICD-10-CM | POA: Diagnosis not present

## 2022-07-02 DIAGNOSIS — G4700 Insomnia, unspecified: Secondary | ICD-10-CM

## 2022-07-02 DIAGNOSIS — F411 Generalized anxiety disorder: Secondary | ICD-10-CM | POA: Diagnosis not present

## 2022-07-02 DIAGNOSIS — F900 Attention-deficit hyperactivity disorder, predominantly inattentive type: Secondary | ICD-10-CM | POA: Diagnosis not present

## 2022-07-02 NOTE — Progress Notes (Signed)
NURSES NOTE:     Patient arrived for her 63 th Spravato treatment. Pt is being treated for Treatment Resistant Depression, the starting dose is 56 mg (2 of the 28 mg) nasal sprays. Pt will be receiving 84 mg, this is her maintenance dose. Patient taken to treatment room, I explained and discussed her treatment and how the schedule should go, along with any side effects that may occur. Answered any questions and concerns the patient had. Pt's Spravato is delivered through [place] and she is now billing with her insurance. Spravato medication is stored at doctors office per REMS/FDA guidelines. The medication is required to be locked behind two doors per FDA/REMS Protocol. Medication is also disposed of properly per regulations.      Vital Signs assessed at 9:00 AM 84/58, pulse 61, pulse ox 92%. Instructed pt to blow her nose and recline back slightly to prevent any of medication dripping out of her nose. Pt. Given 1st dose (28 mg inhaler), then 5 minutes between 2nd dose and 3rd dose. No complaints of nausea/vomiting reported. Pt did listen to music today, she just reclined back and closed her eyes. After 40 minutes I went to assess her vital signs, no side effects or symptoms reported, B/P 98/61, pulse 55. Informed pt could rest in the chair Dr. Clovis Pu came in to discuss her treatment at the end. She had no questions/concerns, Nurse was with pt a total of 60 minutes but pt observed for 120 minutes per protocol. Discharge vital signs were at 11:00 AM, B/P 89/63, pulse 59. She reports dissociation she was clear upon her discharge, she left with her husband. She scheduled her next apt Monday afternoon , March 11th. Pt is doing more and getting out of house more. Pt feeling much better.   LOT # [id number] EXP JAN 2027

## 2022-07-02 NOTE — Progress Notes (Incomplete)
Heather Davila ZL:[id number] Sep 15, 1960 62 y.o.  Subjective:   Patient ID:  Heather Davila is a 62 y.o. (DOB [birth year]) female.  Chief Complaint:  No chief complaint on file.   HPI Heather Davila presents to the office today for follow-up of treatment resistant recurrent depression, generalized anxiety disorder and insomnia.  Almost too numerous to count med failures. She is here for Spravato administration for her treatment resistant depression.  05/21/22 appt noted: Patient was administered first dose Spravato 56 mg intranasally today.  The patient experienced the typical dissociation which gradually resolved over the 2-hour period of observation.  There were no complications.  Specifically the patient did not have nausea or vomiting or headache.   Dissociation was mild. Did not experience any emotional relief from disabling depression.wants to increase Spravato to the usual dose.  She is aware insurance is not covering the costs at this time.  No SI acutely. Tolerating meds.    05/23/22 appt noted: Patient was administered Spravato 84 mg intranasally today.  The patient experienced the typical dissociation which gradually resolved over the 2-hour period of observation.  There were no complications.  Specifically the patient did not have nausea or vomiting.   Dissociation was greater than last dose and a little bothersome DT some HA. Tolerating meds.   Mood has not changed significantly thus far with Spravato.  05/27/22 appt noted: Patient was administered Spravato 84 mg intranasally today.  The patient experienced the typical dissociation which gradually resolved over the 2-hour period of observation.  There were no complications.  Specifically the patient did not have nausea or vomiting or headache. Today she has felt a little relief from the pressing heaviness of depression but a little more anxiety.  During administration of Spravato she listens to [place] and was able to relax a  little more with the feeling of dissociation that was mildly bothersome. Husband was also in on the session today and asked some questions about IV ketamine versus nasal spray ketamine.  05/29/22 appt noted: Cancelled today DT hypertension  05/30/22 appt noted: Patient was administered Spravato 84 mg intranasally today.  The patient experienced the typical dissociation which gradually resolved over the 2-hour period of observation.  There were no complications.  Specifically the patient did not have nausea or vomiting or headache. She is seeing some improvement in mood with less continuous depression and less intensity.  Hopefulness is better.   No med complaints or SE  06/05/22 appt noted: Patient was administered Spravato 84 mg intranasally today.  The patient experienced the typical dissociation which gradually resolved over the 2-hour period of observation.  There were no complications.  Specifically the patient did not have nausea or vomiting or headache.  Specifically HA is less of a problem with Spravato. No SE with meds. Depression is improving with less intensity.  Intermittent anxiety easily.  More able to enjoy things.  No new concerns.  06/17/22 appt noted: Patient was administered Spravato 84 mg intranasally today.  The patient experienced the typical dissociation which gradually resolved over the 2-hour period of observation.  There were no complications.  Specifically the patient did not have nausea or vomiting or headache.  Specifically HA is less of a problem with Spravato. No SE with meds. Depression is improved 45% with less intensity.  Intermittent anxiety less easily.  More able to enjoy things.  No new concerns.  More active. Current meds: increased clonazepam to about 1.5 mg HS DT recent insomnia, lexapro 20, Dayvigo 10 mg  HS, concerta 36 mg AM, trazodone 100 mg HS.  06/20/22 appt noted: Current meds: increased clonazepam to about 1.5 mg HS DT recent insomnia, lexapro 20,  Dayvigo 10 mg HS, concerta 36 mg AM, trazodone 100 mg HS. Patient was administered Spravato 84 mg intranasally today.  The patient experienced the typical dissociation which gradually resolved over the 2-hour period of observation.  There were no complications.  Specifically the patient did not have nausea or vomiting or headache.  Specifically HA is less of a problem with Spravato. No SE with meds. Depression is improved 45% with less intensity.  Intermittent anxiety less easily.  More able to enjoy things.  No new concerns.  More active.  06/23/22 appt noted:  Current meds: increased clonazepam to about 1.5 mg HS DT recent insomnia, lexapro 20, Dayvigo 10 mg HS, concerta 36 mg AM, trazodone 100 mg HS. Patient was administered Spravato 84 mg intranasally today.  The patient experienced the typical dissociation which gradually resolved over the 2-hour period of observation.  There were no complications.  Specifically the patient did not have nausea or vomiting or headache.  Specifically HA is less of a problem with Spravato. No SE with meds. Intensity of Spravato dissociation varies.  Last week very intesne and this week more typical.  Depression is continuing to improve with better interest and activity and motivation.  Gradual improvement.  Wants to continue.  06/25/22 appt noted: Current meds: increased clonazepam to about 1.5 mg HS DT recent insomnia, lexapro 20, Dayvigo 10 mg HS, concerta 36 mg AM, trazodone 100 mg HS. Patient was administered Spravato 84 mg intranasally today.  The patient experienced the typical dissociation which gradually resolved over the 2-hour period of observation.  There were no complications.  Specifically the patient did not have nausea or vomiting or headache.  Specifically HA is less of a problem with Spravato. No SE with meds. Intensity of Spravato dissociation varies.  No scary and well tolerated. Continues to gradually improve with Spravato.  She is more motivated  and enjoying things more.  H sees progress.  Wants to continue twice weekly until gets maximum response.  Dep is not gone yet.  06/30/22 appt noted:  seen with H Current meds: increased clonazepam to about 1.5 mg HS DT recent insomnia, lexapro 20, Dayvigo 10 mg HS, concerta 36 mg AM, trazodone 100 mg HS. Patient was administered Spravato 84 mg intranasally today.  The patient experienced the typical dissociation which gradually resolved over the 2-hour period of observation.  There were no complications.  Specifically the patient did not have nausea or vomiting or headache.  Specifically HA is less of a problem with Spravato. No SE with meds. Intensity of Spravato dissociation varies.  No scary and well tolerated. Continues to gradually improve with Spravato.  She is more motivated and enjoying things more.  H sees even more progress than she does; more active and smiling.  Wants to continue twice weekly until gets maximum response.  Dep is 80% better.  Past Psychiatric Medication Trials: Trintellix - Initially effective and then was not effective when re-started Paxil- Ineffective. Helped initially. Prozac-Insomnia Lexapro- Effective and then no longer as effective Zoloft- Initially effective and then not as effective.  Viibryd Cymbalta- Ineffective. Helped initially. Effexor XR- Effective, well tolerated. Pristiq- Increased anxiety at 150 mg daily Wellbutrin XL-Ineffective.May have increased anxiety. Amitriptyline Nortriptyline-Caused irritability, constipation Auvelity- ineffective Abilify- Helpful but caused severe insomnia. Rexulti-Helpful but caused insomnia. Vraylar Seroquel - Ineffective Risperdal Olanzapine- Ineffective Latuda-  Ineffective Caplyta Klonopin- Effective Temazepam- Took during menopause Lunesta- Ineffective Ambien-Ineffective Sonata- Ineffective Dayvigo- partially effective Trazodone- Ineffective Remeron-Ineffective. Does not recall wt gain.  Adderall- Took  once and had adverse effect. Concerta- Effective Metadate-Not as effective as Concerta Dexmethylphenidate- Not as effective as Concerta Azstarys- some side effects. Not as effective.  Lamictal- May have had an adverse effects. "Felt weird." Reports worsening s/s.  Lithium Gabapentin Pramipexole- "felt really weird."   ECT #20 with minimal response. H says it was partly helpful.  AIMS    Flowsheet Row Video Visit from 01/03/2022 in [facility] Total Score 0      ECT-MADRS    Flowsheet Row Video Visit from 04/29/2022 in [facility] Video Visit from 02/06/2022 in [facility]  MADRS Total Score 44 23      PHQ2-9    Flowsheet Row Video Visit from 02/06/2022 in [place] Office Visit from 11/16/2020 in [facility] Visit from 04/19/2018 in [place] [facility] Total Score 6 0 0  PHQ-9 Total Score 14 -- 2        Review of Systems:  Review of Systems  Constitutional:  Negative for fatigue.  Cardiovascular:  Negative for palpitations.  Neurological:  Negative for tremors.  Psychiatric/Behavioral:  Positive for dysphoric mood. Negative for decreased concentration. The patient is not nervous/anxious.     Medications: I have reviewed the patient's current medications.  Current Outpatient Medications  Medication Sig Dispense Refill  . clonazePAM (KLONOPIN) 0.5 MG tablet Take 1-1.5 tabs at bedtime and 1/2-1 tab as needed for anxiety. 60 tablet 1  . doxycycline (VIBRAMYCIN) 50 MG capsule Take 50 mg by mouth daily.    Marland Kitchen escitalopram (LEXAPRO) 20 MG tablet Take 1/2 tablet daily for 7 days, then stop 30 tablet 2  . estradiol (ESTRACE) 0.1 MG/GM vaginal cream USE 1 GM VAGINALLY TWICE A WEEK AT BEDTIME 42.5 g 0  . gabapentin (NEURONTIN) 100 MG capsule Take 1 capsule (100 mg total)  by mouth 3 (three) times daily as needed. Take 1 capsule three times daily (Patient not taking: Reported on 07/01/2022) 90 capsule 1  . gabapentin (NEURONTIN) 800 MG tablet Take 1 tablet (800 mg total) by mouth at bedtime. 90 tablet 1  . Lemborexant (DAYVIGO) 10 MG TABS Take 1/2-1 tab po QHS 30 tablet 2  . MAGNESIUM PO Take by mouth.    . methylphenidate (CONCERTA) 27 MG PO CR tablet Take 1 tablet (27 mg total) by mouth every morning. 30 tablet 0  . Multiple Vitamin (MULTIVITAMIN) tablet Take 1 tablet by mouth daily.    . sertraline (ZOLOFT) 100 MG tablet Take 1/2 tablet daily for 5 days, then 1 tablet daily for 5 days, then 1.5 tablets daily 45 tablet 1  . SPRAVATO, 84 MG DOSE, 28 MG/DEVICE SOPK USE 3 SPRAY IN EACH NOSTRIL EVERY 3 (THREE) DAYS. 3 each 5  . traZODone (DESYREL) 100 MG tablet Take 1 tablet (100 mg total) by mouth at bedtime. Take 1-2 tabs po QHS prn 180 tablet 1  . valACYclovir (VALTREX) 500 MG tablet Take one twice daily for 3 days with any symptoms 30 tablet 3   No current facility-administered medications for this visit.    Medication Side Effects: None  Allergies:  Allergies  Allergen Reactions  . Ciprofloxacin     REACTION: hives  . Oxycodone-Acetaminophen  REACTION: hives    Past Medical History:  Diagnosis Date  . Adult acne   . Anemia   . Anorexia nervosa teen  . DEPRESSION 08/09/2009  . Hematoma 09/2020   post op after face lift  . Insomnia   . 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  . TRANSAMINASES, SERUM, ELEVATED 08/14/2009    Past Medical History, Surgical history, Social history, and Family history were reviewed and updated as appropriate.   Please see review of systems for further details on the patient's review from today.   Objective:   Physical Exam:  LMP 12/27/2007 (Exact Date)   Physical Exam Constitutional:      General: She is not in acute distress. Musculoskeletal:        General:  No deformity.  Neurological:     Mental Status: She is alert and oriented to person, place, and time.     Coordination: Coordination normal.  Psychiatric:        Attention and Perception: Attention and perception normal. She does not perceive auditory or visual hallucinations.        Mood and Affect: Mood is anxious and depressed. Affect is not labile, blunt or angry.        Speech: Speech normal.        Behavior: Behavior normal.        Thought Content: Thought content normal. Thought content is not delusional. Thought content does not include homicidal or suicidal ideation. Thought content does not include suicidal plan.        Cognition and Memory: Cognition and memory normal.        Judgment: Judgment normal.     Comments: Insight intact Less intense depression by 85%  Some smiling     Lab Review:     Component Value Date/Time   NA 138 04/22/2022 0932   K 4.1 04/22/2022 0932   CL 98 04/22/2022 0932   CO2 31 04/22/2022 0932   GLUCOSE 102 (H) 04/22/2022 0932   BUN 10 04/22/2022 0932   CREATININE 0.71 04/22/2022 0932   CREATININE 0.68 01/26/2020 0920   CALCIUM 10.5 04/22/2022 0932   PROT 8.3 04/22/2022 0932   ALBUMIN 5.3 (H) 04/22/2022 0932   AST 82 (H) 04/22/2022 0932   ALT 118 (H) 04/22/2022 0932   ALKPHOS 74 04/22/2022 0932   BILITOT 0.3 04/22/2022 0932   GFRNONAA >90 05/01/2014 1645   GFRAA >90 05/01/2014 1645       Component Value Date/Time   WBC 3.3 (L) 04/22/2022 0932   RBC 4.25 04/22/2022 0932   HGB 14.3 04/22/2022 0932   HGB 14.1 10/26/2014 1034   HCT 40.7 04/22/2022 0932   PLT 245.0 04/22/2022 0932   MCV 95.7 04/22/2022 0932   MCH 34.4 (H) 01/26/2020 0920   MCHC 35.1 04/22/2022 0932   RDW 13.2 04/22/2022 0932   LYMPHSABS 0.9 04/22/2022 0932   MONOABS 0.2 04/22/2022 0932   EOSABS 0.1 04/22/2022 0932   BASOSABS 0.0 04/22/2022 0932    No results found for: "POCLITH", "LITHIUM"   No results found for: "PHENYTOIN", "PHENOBARB", "VALPROATE", "CBMZ"    .res Assessment: Plan:    There are no diagnoses linked to this encounter.   Pt seen for TRD and administration of Spravato.  Patient was administered Spravato 84 mg intranasally today.  The patient experienced the typical dissociation which gradually resolved over the 2-hour period of observation.  There were no complications.  Specifically the patient  did not have nausea or vomiting. But mild headache.  Blood pressures remained within normal ranges at the 40-minute and 2-hour follow-up intervals.  By the time the 2-hour observation period was met the patient was alert and oriented and able to exit without assistance.  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  See nursing note for further details.Tolerated the Spravato to '84mg'$  .  Regarding med options.  She has obviously been thoroughly tried on numerous medications.  The only antidepressant category that she has not taken is MAO inhibitors which are not compatible with Spravato.  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 Disc this with her previously.    Today she was 85% less depressed and more hopeful.  She and H very pleased. Wants to continue the tx plan  Tolerating meds.  Satisfied she is making progress No med changes today  FU twice weekly Spravato until improvement maxes out then drop back to weekly.  Lynder Parents, MD, DFAPA    Please see After Visit Summary for patient specific instructions.  Future Appointments  Date Time Provider [place]  07/07/2022  2:00 PM Cottle, Billey Co., MD CP-CP None  07/07/2022  2:00 PM CP-NURSE CP-CP None  07/09/2022  8:00 AM CP-NURSE CP-CP None  07/09/2022  9:00 AM Cottle, Billey Co., MD CP-CP None  08/18/2022  2:35 PM Megan Salon, MD [facility]     No orders of the defined types were placed in this encounter.   ------------------------------- T

## 2022-07-07 ENCOUNTER — Ambulatory Visit: Payer: 59

## 2022-07-07 ENCOUNTER — Encounter: Payer: Self-pay | Admitting: Psychiatry

## 2022-07-07 ENCOUNTER — Ambulatory Visit (INDEPENDENT_AMBULATORY_CARE_PROVIDER_SITE_OTHER): Payer: 59 | Admitting: Psychiatry

## 2022-07-07 VITALS — BP 130/64 | HR 72

## 2022-07-07 DIAGNOSIS — G4700 Insomnia, unspecified: Secondary | ICD-10-CM

## 2022-07-07 DIAGNOSIS — F900 Attention-deficit hyperactivity disorder, predominantly inattentive type: Secondary | ICD-10-CM | POA: Diagnosis not present

## 2022-07-07 DIAGNOSIS — F339 Major depressive disorder, recurrent, unspecified: Secondary | ICD-10-CM | POA: Diagnosis not present

## 2022-07-07 DIAGNOSIS — F411 Generalized anxiety disorder: Secondary | ICD-10-CM

## 2022-07-07 NOTE — Progress Notes (Signed)
NURSES NOTE:     Patient arrived for her 15th Spravato treatment. Pt is being treated for Treatment Resistant Depression, the starting dose is 56 mg (2 of the 28 mg) nasal sprays. Pt will be receiving 84 mg, this is her maintenance dose. Patient taken to treatment room, I explained and discussed her treatment and how the schedule should go, along with any side effects that may occur. Answered any questions and concerns the patient had. Pt's Spravato is delivered through [place] and she is now billing with her insurance. Spravato medication is stored at doctors office per REMS/FDA guidelines. The medication is required to be locked behind two doors per FDA/REMS Protocol. Medication is also disposed of properly per regulations.      Vital Signs assessed at 1:45 PM 100/58, pulse 59, pulse ox 92%. Instructed pt to blow her nose and recline back slightly to prevent any of medication dripping out of her nose. Pt. Given 1st dose (28 mg inhaler), then 5 minutes between 2nd dose and 3rd dose. No complaints of nausea/vomiting reported. Pt did listen to music today, she just reclined back and closed her eyes. After 40 minutes I went to assess her vital signs, no side effects or symptoms reported, B/P 106/72, pulse 63. Informed pt could rest in the chair Dr. Clovis Pu came in to discuss her treatment at the end. She had no questions/concerns, Nurse was with pt a total of 60 minutes but pt observed for 120 minutes per protocol. Discharge vital signs were at 3:45 PM, B/P 130/64, pulse 72. She reports dissociation she was clear upon her discharge, she left with her husband. She scheduled her next apt Wednesday morning, March 13th. Pt is doing more and getting out of house more. Pt feeling much better.   LOT # [id number] EXP JAN 2027

## 2022-07-07 NOTE — Progress Notes (Signed)
Heather Davila ZL:[id number] 1961/04/21 62 y.o.  Subjective:   Patient ID:  Heather Davila is a 62 y.o. (DOB [birth year]) female.  Chief Complaint:  Chief Complaint  Patient presents with   Follow-up   Depression   Anxiety   Fatigue    HPI Heather Davila presents to the office today for follow-up of treatment resistant recurrent depression, generalized anxiety disorder and insomnia.  Almost too numerous to count med failures. Heather Davila is here for Spravato administration for her treatment resistant depression.  05/21/22 appt noted: Patient was administered first dose Spravato 56 mg intranasally today.  The patient experienced the typical dissociation which gradually resolved over the 2-hour period of observation.  There were no complications.  Specifically the patient did not have nausea or vomiting or headache.   Dissociation was mild. Did not experience any emotional relief from disabling depression.wants to increase Spravato to the usual dose.  Heather Davila is aware insurance is not covering the costs at this time.  No SI acutely. Tolerating meds.    05/23/22 appt noted: Patient was administered Spravato 84 mg intranasally today.  The patient experienced the typical dissociation which gradually resolved over the 2-hour period of observation.  There were no complications.  Specifically the patient did not have nausea or vomiting.   Dissociation was greater than last dose and a little bothersome DT some HA. Tolerating meds.   Mood has not changed significantly thus far with Spravato.  05/27/22 appt noted: Patient was administered Spravato 84 mg intranasally today.  The patient experienced the typical dissociation which gradually resolved over the 2-hour period of observation.  There were no complications.  Specifically the patient did not have nausea or vomiting or headache. Today Heather Davila has felt a little relief from the pressing heaviness of depression but a little more anxiety.  During administration of  Spravato Heather Davila listens to [place] and was able to relax a little more with the feeling of dissociation that was mildly bothersome. Husband was also in on the session today and asked some questions about IV ketamine versus nasal spray ketamine.  05/29/22 appt noted: Cancelled today DT hypertension  05/30/22 appt noted: Patient was administered Spravato 84 mg intranasally today.  The patient experienced the typical dissociation which gradually resolved over the 2-hour period of observation.  There were no complications.  Specifically the patient did not have nausea or vomiting or headache. Heather Davila is seeing some improvement in mood with less continuous depression and less intensity.  Hopefulness is better.   No med complaints or SE  06/05/22 appt noted: Patient was administered Spravato 84 mg intranasally today.  The patient experienced the typical dissociation which gradually resolved over the 2-hour period of observation.  There were no complications.  Specifically the patient did not have nausea or vomiting or headache.  Specifically HA is less of a problem with Spravato. No SE with meds. Depression is improving with less intensity.  Intermittent anxiety easily.  More able to enjoy things.  No new concerns.  06/17/22 appt noted: Patient was administered Spravato 84 mg intranasally today.  The patient experienced the typical dissociation which gradually resolved over the 2-hour period of observation.  There were no complications.  Specifically the patient did not have nausea or vomiting or headache.  Specifically HA is less of a problem with Spravato. No SE with meds. Depression is improved 45% with less intensity.  Intermittent anxiety less easily.  More able to enjoy things.  No new concerns.  More active. Current meds: increased  clonazepam to about 1.5 mg HS DT recent insomnia, lexapro 20, Dayvigo 10 mg HS, concerta 36 mg AM, trazodone 100 mg HS.  06/20/22 appt noted: Current meds: increased  clonazepam to about 1.5 mg HS DT recent insomnia, lexapro 20, Dayvigo 10 mg HS, concerta 36 mg AM, trazodone 100 mg HS. Patient was administered Spravato 84 mg intranasally today.  The patient experienced the typical dissociation which gradually resolved over the 2-hour period of observation.  There were no complications.  Specifically the patient did not have nausea or vomiting or headache.  Specifically HA is less of a problem with Spravato. No SE with meds. Depression is improved 45% with less intensity.  Intermittent anxiety less easily.  More able to enjoy things.  No new concerns.  More active.  06/23/22 appt noted:  Current meds: increased clonazepam to about 1.5 mg HS DT recent insomnia, lexapro 20, Dayvigo 10 mg HS, concerta 36 mg AM, trazodone 100 mg HS. Patient was administered Spravato 84 mg intranasally today.  The patient experienced the typical dissociation which gradually resolved over the 2-hour period of observation.  There were no complications.  Specifically the patient did not have nausea or vomiting or headache.  Specifically HA is less of a problem with Spravato. No SE with meds. Intensity of Spravato dissociation varies.  Last week very intesne and this week more typical.  Depression is continuing to improve with better interest and activity and motivation.  Gradual improvement.  Wants to continue.  06/25/22 appt noted: Current meds: increased clonazepam to about 1.5 mg HS DT recent insomnia, lexapro 20, Dayvigo 10 mg HS, concerta 36 mg AM, trazodone 100 mg HS. Patient was administered Spravato 84 mg intranasally today.  The patient experienced the typical dissociation which gradually resolved over the 2-hour period of observation.  There were no complications.  Specifically the patient did not have nausea or vomiting or headache.  Specifically HA is less of a problem with Spravato. No SE with meds. Intensity of Spravato dissociation varies.  No scary and well  tolerated. Continues to gradually improve with Spravato.  Heather Davila is more motivated and enjoying things more.  H sees progress.  Wants to continue twice weekly until gets maximum response.  Dep is not gone yet.  06/30/22 appt noted:  seen with H Current meds: increased clonazepam to about 1.5 mg HS DT recent insomnia, lexapro 20, Dayvigo 10 mg HS, concerta 36 mg AM, trazodone 100 mg HS. Patient was administered Spravato 84 mg intranasally today.  The patient experienced the typical dissociation which gradually resolved over the 2-hour period of observation.  There were no complications.  Specifically the patient did not have nausea or vomiting or headache.  Specifically HA is less of a problem with Spravato. No SE with meds. Intensity of Spravato dissociation varies.  No scary and well tolerated. Continues to gradually improve with Spravato.  Heather Davila is more motivated and enjoying things more.  H sees even more progress than Heather Davila does; more active and smiling.  Wants to continue twice weekly until gets maximum response.  Dep is 80% better.  07/07/22 appt noted: Current meds: increased clonazepam to about 1.5 mg HS DT recent insomnia, lexapro 20, Dayvigo 10 mg HS, concerta 36 mg AM, trazodone 100 mg HS. Patient was administered Spravato 84 mg intranasally today.  The patient experienced the typical dissociation which gradually resolved over the 2-hour period of observation.  There were no complications.  Specifically the patient did not have nausea or vomiting or  headache.  Specifically HA is less of a problem with Spravato. No SE with meds. Intensity of Spravato dissociation varies.  No scary and well tolerated.  Was more intese today. Overall mood is markedly better and please with meds. No changes desired.  Past Psychiatric Medication Trials: Trintellix - Initially effective and then was not effective when re-started Paxil- Ineffective. Helped initially. Prozac-Insomnia Lexapro- Effective and then no  longer as effective Zoloft- Initially effective and then not as effective.  Viibryd Cymbalta- Ineffective. Helped initially. Effexor XR- Effective, well tolerated. Pristiq- Increased anxiety at 150 mg daily Wellbutrin XL-Ineffective.May have increased anxiety. Amitriptyline Nortriptyline-Caused irritability, constipation Auvelity- ineffective Abilify- Helpful but caused severe insomnia. Rexulti-Helpful but caused insomnia. Vraylar Seroquel - Ineffective Risperdal Olanzapine- Ineffective Latuda- Ineffective Caplyta Klonopin- Effective Temazepam- Took during menopause Lunesta- Ineffective Ambien-Ineffective Sonata- Ineffective Dayvigo- partially effective Trazodone- Ineffective Remeron-Ineffective. Does not recall wt gain.  Adderall- Took once and had adverse effect. Concerta- Effective Metadate-Not as effective as Concerta Dexmethylphenidate- Not as effective as Concerta Azstarys- some side effects. Not as effective.  Lamictal- May have had an adverse effects. "Felt weird." Reports worsening s/s.  Lithium Gabapentin Pramipexole- "felt really weird."   ECT #20 with minimal response. H says it was partly helpful.  AIMS    Flowsheet Row Video Visit from 01/03/2022 in [place] Total Score 0      ECT-MADRS    Flowsheet Row Video Visit from 04/29/2022 in [facility] Video Visit from 02/06/2022 in [facility]  MADRS Total Score 44 23      PHQ2-9    Flowsheet Row Video Visit from 02/06/2022 in [place] Office Visit from 11/16/2020 in [facility] [place] Visit from 04/19/2018 in [place] [facility] Total Score 6 0 0  PHQ-9 Total Score 14 -- 2        Review of Systems:  Review of Systems  Constitutional:  Negative for fatigue.  Cardiovascular:  Negative for  palpitations.  Psychiatric/Behavioral:  Positive for dysphoric mood. Negative for decreased concentration. The patient is not nervous/anxious.     Medications: I have reviewed the patient's current medications.  Current Outpatient Medications  Medication Sig Dispense Refill   clonazePAM (KLONOPIN) 0.5 MG tablet Take 1-1.5 tabs at bedtime and 1/2-1 tab as needed for anxiety. 60 tablet 1   doxycycline (VIBRAMYCIN) 50 MG capsule Take 50 mg by mouth daily.     escitalopram (LEXAPRO) 20 MG tablet Take 1/2 tablet daily for 7 days, then stop 30 tablet 2   estradiol (ESTRACE) 0.1 MG/GM vaginal cream USE 1 GM VAGINALLY TWICE A WEEK AT BEDTIME 42.5 g 0   gabapentin (NEURONTIN) 800 MG tablet Take 1 tablet (800 mg total) by mouth at bedtime. 90 tablet 1   Lemborexant (DAYVIGO) 10 MG TABS Take 1/2-1 tab po QHS 30 tablet 2   MAGNESIUM PO Take by mouth.     methylphenidate (CONCERTA) 27 MG PO CR tablet Take 1 tablet (27 mg total) by mouth every morning. 30 tablet 0   Multiple Vitamin (MULTIVITAMIN) tablet Take 1 tablet by mouth daily.     sertraline (ZOLOFT) 100 MG tablet Take 1/2 tablet daily for 5 days, then 1 tablet daily for 5 days, then 1.5 tablets daily 45 tablet 1   SPRAVATO, 84 MG DOSE, 28 MG/DEVICE SOPK USE 3 SPRAY IN EACH NOSTRIL EVERY 3 (THREE) DAYS. 3  each 5   traZODone (DESYREL) 100 MG tablet Take 1 tablet (100 mg total) by mouth at bedtime. Take 1-2 tabs po QHS prn 180 tablet 1   valACYclovir (VALTREX) 500 MG tablet Take one twice daily for 3 days with any symptoms 30 tablet 3   gabapentin (NEURONTIN) 100 MG capsule Take 1 capsule (100 mg total) by mouth 3 (three) times daily as needed. Take 1 capsule three times daily 90 capsule 1   No current facility-administered medications for this visit.    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   TRANSAMINASES, SERUM, ELEVATED 08/14/2009    Past Medical History, Surgical history, Social history, and Family history were reviewed and updated as appropriate.   Please see review of systems for further details on the patient's review from today.   Objective:   Physical Exam:  LMP 12/27/2007 (Exact Date)   Physical Exam Constitutional:      General: Heather Davila is not in acute distress. Musculoskeletal:        General: No deformity.  Neurological:     Mental Status: Heather Davila is alert and oriented to person, place, and time.     Coordination: Coordination normal.  Psychiatric:        Attention and Perception: Attention and perception normal. Heather Davila does not perceive auditory or visual hallucinations.        Mood and Affect: Mood is anxious and depressed. Affect is not labile, blunt or angry.        Speech: Speech normal.        Behavior: Behavior normal.        Thought Content: Thought content normal. Thought content is not delusional. Thought content does not include homicidal or suicidal ideation. Thought content does not include suicidal plan.        Cognition and Memory: Cognition and memory normal.        Judgment: Judgment normal.     Comments: Insight intact Less intense depression by 85%  More smiling     Lab Review:     Component Value Date/Time   NA 138 04/22/2022 0932   K 4.1 04/22/2022 0932   CL 98 04/22/2022 0932   CO2 31 04/22/2022 0932   GLUCOSE 102 (H) 04/22/2022 0932   BUN 10 04/22/2022 0932   CREATININE 0.71 04/22/2022 0932   CREATININE 0.68 01/26/2020 0920   CALCIUM 10.5 04/22/2022 0932   PROT 8.3 04/22/2022 0932   ALBUMIN 5.3 (H) 04/22/2022 0932   AST 82 (H) 04/22/2022 0932   ALT 118 (H) 04/22/2022 0932   ALKPHOS 74  04/22/2022 0932   BILITOT 0.3 04/22/2022 0932   GFRNONAA >90 05/01/2014 1645   GFRAA >90 05/01/2014 1645       Component Value Date/Time   WBC 3.3 (L) 04/22/2022 0932   RBC 4.25 04/22/2022 0932   HGB 14.3 04/22/2022 0932   HGB 14.1 10/26/2014 1034   HCT 40.7 04/22/2022 0932   PLT 245.0 04/22/2022 0932   MCV 95.7 04/22/2022 0932   MCH 34.4 (H)  01/26/2020 0920   MCHC 35.1 04/22/2022 0932   RDW 13.2 04/22/2022 0932   LYMPHSABS 0.9 04/22/2022 0932   MONOABS 0.2 04/22/2022 0932   EOSABS 0.1 04/22/2022 0932   BASOSABS 0.0 04/22/2022 0932    No results found for: "POCLITH", "LITHIUM"   No results found for: "PHENYTOIN", "PHENOBARB", "VALPROATE", "CBMZ"   .res Assessment: Plan:    Heather Davila was seen today for follow-up, depression, anxiety and fatigue.  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  Patient was administered Spravato 84 mg intranasally today.  The patient experienced the typical dissociation which gradually resolved over the 2-hour period of observation.  There were no complications.  Specifically the patient did not have nausea or vomiting. But mild headache.  Blood pressures remained within normal ranges at the 40-minute and 2-hour follow-up intervals.  By the time the 2-hour observation period was met the patient was alert and oriented and able to exit without assistance.  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  See nursing note for further details.Tolerated the Spravato to '84mg'$  .  Regarding med options.  Heather Davila has obviously been thoroughly tried on numerous medications.  The only antidepressant category that Heather Davila has not taken is MAO  inhibitors which are not compatible with Spravato.  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 Disc this with her previously.    Today Heather Davila was 85% less depressed and more hopeful.  Heather Davila and H very pleased. Wants to continue the tx plan  Tolerating meds.  Satisfied Heather Davila is making progress No med changes today  FU twice weekly Spravato until improvement maxes out then drop back to weekly.  Lynder Parents, MD, DFAPA    Please see After Visit Summary for patient specific instructions.  Future Appointments  Date Time Provider Craig  07/09/2022  8:00 AM CP-NURSE [facility] None  07/09/2022  9:00 AM Cottle, Billey Co., MD [facility] None  07/29/2022  9:00 AM [name], PMHNP CP-CP None  11/14/2022  9:55 AM Megan Salon, MD [facility]    No orders of the defined types were placed in this encounter.   ------------------------------- T

## 2022-07-09 ENCOUNTER — Encounter: Payer: Self-pay | Admitting: Psychiatry

## 2022-07-09 ENCOUNTER — Telehealth: Payer: Self-pay

## 2022-07-09 ENCOUNTER — Ambulatory Visit (INDEPENDENT_AMBULATORY_CARE_PROVIDER_SITE_OTHER): Payer: 59 | Admitting: Psychiatry

## 2022-07-09 ENCOUNTER — Ambulatory Visit: Payer: 59

## 2022-07-09 VITALS — BP 115/62 | HR 62

## 2022-07-09 DIAGNOSIS — G4700 Insomnia, unspecified: Secondary | ICD-10-CM | POA: Diagnosis not present

## 2022-07-09 DIAGNOSIS — F900 Attention-deficit hyperactivity disorder, predominantly inattentive type: Secondary | ICD-10-CM | POA: Diagnosis not present

## 2022-07-09 DIAGNOSIS — F411 Generalized anxiety disorder: Secondary | ICD-10-CM | POA: Diagnosis not present

## 2022-07-09 DIAGNOSIS — F339 Major depressive disorder, recurrent, unspecified: Secondary | ICD-10-CM

## 2022-07-09 NOTE — Progress Notes (Signed)
Heather Davila ZL:[id number] 1961/04/21 62 y.o.  Subjective:   Patient ID:  Heather Davila is a 62 y.o. (DOB [birth year]) female.  Chief Complaint:  Chief Complaint  Patient presents with   Follow-up   Depression   Anxiety   Fatigue    HPI Heather Davila presents to the office today for follow-up of treatment resistant recurrent depression, generalized anxiety disorder and insomnia.  Almost too numerous to count med failures. She is here for Spravato administration for her treatment resistant depression.  05/21/22 appt noted: Patient was administered first dose Spravato 56 mg intranasally today.  The patient experienced the typical dissociation which gradually resolved over the 2-hour period of observation.  There were no complications.  Specifically the patient did not have nausea or vomiting or headache.   Dissociation was mild. Did not experience any emotional relief from disabling depression.wants to increase Spravato to the usual dose.  She is aware insurance is not covering the costs at this time.  No SI acutely. Tolerating meds.    05/23/22 appt noted: Patient was administered Spravato 84 mg intranasally today.  The patient experienced the typical dissociation which gradually resolved over the 2-hour period of observation.  There were no complications.  Specifically the patient did not have nausea or vomiting.   Dissociation was greater than last dose and a little bothersome DT some HA. Tolerating meds.   Mood has not changed significantly thus far with Spravato.  05/27/22 appt noted: Patient was administered Spravato 84 mg intranasally today.  The patient experienced the typical dissociation which gradually resolved over the 2-hour period of observation.  There were no complications.  Specifically the patient did not have nausea or vomiting or headache. Today she has felt a little relief from the pressing heaviness of depression but a little more anxiety.  During administration of  Spravato she listens to [place] and was able to relax a little more with the feeling of dissociation that was mildly bothersome. Husband was also in on the session today and asked some questions about IV ketamine versus nasal spray ketamine.  05/29/22 appt noted: Cancelled today DT hypertension  05/30/22 appt noted: Patient was administered Spravato 84 mg intranasally today.  The patient experienced the typical dissociation which gradually resolved over the 2-hour period of observation.  There were no complications.  Specifically the patient did not have nausea or vomiting or headache. She is seeing some improvement in mood with less continuous depression and less intensity.  Hopefulness is better.   No med complaints or SE  06/05/22 appt noted: Patient was administered Spravato 84 mg intranasally today.  The patient experienced the typical dissociation which gradually resolved over the 2-hour period of observation.  There were no complications.  Specifically the patient did not have nausea or vomiting or headache.  Specifically HA is less of a problem with Spravato. No SE with meds. Depression is improving with less intensity.  Intermittent anxiety easily.  More able to enjoy things.  No new concerns.  06/17/22 appt noted: Patient was administered Spravato 84 mg intranasally today.  The patient experienced the typical dissociation which gradually resolved over the 2-hour period of observation.  There were no complications.  Specifically the patient did not have nausea or vomiting or headache.  Specifically HA is less of a problem with Spravato. No SE with meds. Depression is improved 45% with less intensity.  Intermittent anxiety less easily.  More able to enjoy things.  No new concerns.  More active. Current meds: increased  clonazepam to about 1.5 mg HS DT recent insomnia, lexapro 20, Dayvigo 10 mg HS, concerta 36 mg AM, trazodone 100 mg HS.  06/20/22 appt noted: Current meds: increased  clonazepam to about 1.5 mg HS DT recent insomnia, lexapro 20, Dayvigo 10 mg HS, concerta 36 mg AM, trazodone 100 mg HS. Patient was administered Spravato 84 mg intranasally today.  The patient experienced the typical dissociation which gradually resolved over the 2-hour period of observation.  There were no complications.  Specifically the patient did not have nausea or vomiting or headache.  Specifically HA is less of a problem with Spravato. No SE with meds. Depression is improved 45% with less intensity.  Intermittent anxiety less easily.  More able to enjoy things.  No new concerns.  More active.  06/23/22 appt noted:  Current meds: increased clonazepam to about 1.5 mg HS DT recent insomnia, lexapro 20, Dayvigo 10 mg HS, concerta 36 mg AM, trazodone 100 mg HS. Patient was administered Spravato 84 mg intranasally today.  The patient experienced the typical dissociation which gradually resolved over the 2-hour period of observation.  There were no complications.  Specifically the patient did not have nausea or vomiting or headache.  Specifically HA is less of a problem with Spravato. No SE with meds. Intensity of Spravato dissociation varies.  Last week very intesne and this week more typical.  Depression is continuing to improve with better interest and activity and motivation.  Gradual improvement.  Wants to continue.  06/25/22 appt noted: Current meds: increased clonazepam to about 1.5 mg HS DT recent insomnia, lexapro 20, Dayvigo 10 mg HS, concerta 36 mg AM, trazodone 100 mg HS. Patient was administered Spravato 84 mg intranasally today.  The patient experienced the typical dissociation which gradually resolved over the 2-hour period of observation.  There were no complications.  Specifically the patient did not have nausea or vomiting or headache.  Specifically HA is less of a problem with Spravato. No SE with meds. Intensity of Spravato dissociation varies.  No scary and well  tolerated. Continues to gradually improve with Spravato.  She is more motivated and enjoying things more.  H sees progress.  Wants to continue twice weekly until gets maximum response.  Dep is not gone yet.  06/30/22 appt noted:  seen with H Current meds: increased clonazepam to about 1.5 mg HS DT recent insomnia, lexapro 20, Dayvigo 10 mg HS, concerta 36 mg AM, trazodone 100 mg HS. Patient was administered Spravato 84 mg intranasally today.  The patient experienced the typical dissociation which gradually resolved over the 2-hour period of observation.  There were no complications.  Specifically the patient did not have nausea or vomiting or headache.  Specifically HA is less of a problem with Spravato. No SE with meds. Intensity of Spravato dissociation varies.  No scary and well tolerated. Continues to gradually improve with Spravato.  She is more motivated and enjoying things more.  H sees even more progress than she does; more active and smiling.  Wants to continue twice weekly until gets maximum response.  Dep is 80% better.  07/07/22 appt noted: Current meds: increased clonazepam to about 1.5 mg HS DT recent insomnia, lexapro 20, Dayvigo 10 mg HS, concerta 36 mg AM, trazodone 100 mg HS. Patient was administered Spravato 84 mg intranasally today.  The patient experienced the typical dissociation which gradually resolved over the 2-hour period of observation.  There were no complications.  Specifically the patient did not have nausea or vomiting or  headache.  Specifically HA is less of a problem with Spravato. No SE with meds. Intensity of Spravato dissociation varies.  No scary and well tolerated.  Was more intese today. Overall mood is markedly better and please with meds. No changes desired.  07/09/22 appt noted: In transition from Lexapro to sertraline and missing Concerta bc CO crash from it after about 4 hours.  Disc these concerns.  She'll discuss also with [name], NP [place] dep the  last couple of days and concerned about reducing Spravato frequency while transition from one antidep to another.  Affect less positive. Patient was administered Spravato 84 mg intranasally today.  The patient experienced the typical dissociation which gradually resolved over the 2-hour period of observation.  There were no complications.  Specifically the patient did not have nausea or vomiting or headache.  Specifically HA is less of a problem with Spravato. No SE with meds. Intensity of Spravato dissociation varies.  No scary and well tolerated.   Past Psychiatric Medication Trials: Trintellix - Initially effective and then was not effective when re-started Paxil- Ineffective. Helped initially. Prozac-Insomnia Lexapro- Effective and then no longer as effective Zoloft- Initially effective and then not as effective.  Viibryd Cymbalta- Ineffective. Helped initially. Effexor XR- Effective, well tolerated. Pristiq- Increased anxiety at 150 mg daily Wellbutrin XL-Ineffective.May have increased anxiety. Amitriptyline Nortriptyline-Caused irritability, constipation Auvelity- ineffective Abilify- Helpful but caused severe insomnia. Rexulti-Helpful but caused insomnia. Vraylar Seroquel - Ineffective Risperdal Olanzapine- Ineffective Latuda- Ineffective Caplyta Klonopin- Effective Temazepam- Took during menopause Lunesta- Ineffective Ambien-Ineffective Sonata- Ineffective Dayvigo- partially effective Trazodone- Ineffective Remeron-Ineffective. Does not recall wt gain.  Adderall- Took once and had adverse effect. Concerta- Effective Metadate-Not as effective as Concerta Dexmethylphenidate- Not as effective as Concerta Azstarys- some side effects. Not as effective.  Lamictal- May have had an adverse effects. "Felt weird." Reports worsening s/s.  Lithium Gabapentin Pramipexole- "felt really weird."   ECT #20 with minimal response. H says it was partly helpful.  AIMS    Flowsheet  Row Video Visit from 01/03/2022 in [facility] Total Score 0      ECT-MADRS    Flowsheet Row Video Visit from 04/29/2022 in [facility] Video Visit from 02/06/2022 in [facility]  MADRS Total Score 44 23      PHQ2-9    Flowsheet Row Video Visit from 02/06/2022 in [facility] Visit from 11/16/2020 in [facility] for [facility] [place] Visit from 04/19/2018 in [place] [facility] Total Score 6 0 0  PHQ-9 Total Score 14 -- 2        Review of Systems:  Review of Systems  Constitutional:  Negative for fatigue.  Cardiovascular:  Negative for palpitations.  Psychiatric/Behavioral:  Positive for dysphoric mood. Negative for agitation and decreased concentration. The patient is not nervous/anxious.     Medications: I have reviewed the patient's current medications.  Current Outpatient Medications  Medication Sig Dispense Refill   clonazePAM (KLONOPIN) 0.5 MG tablet Take 1-1.5 tabs at bedtime and 1/2-1 tab as needed for anxiety. 60 tablet 1   doxycycline (VIBRAMYCIN) 50 MG capsule Take 50 mg by mouth daily.     estradiol (ESTRACE) 0.1 MG/GM vaginal cream USE 1 GM VAGINALLY TWICE A WEEK AT BEDTIME 42.5 g 0   gabapentin (NEURONTIN) 800 MG tablet Take 1 tablet (800 mg total) by mouth at bedtime. 90 tablet 1  Lemborexant (DAYVIGO) 10 MG TABS Take 1/2-1 tab po QHS 30 tablet 2   MAGNESIUM PO Take by mouth.     methylphenidate (CONCERTA) 27 MG PO CR tablet Take 1 tablet (27 mg total) by mouth every morning. 30 tablet 0   Multiple Vitamin (MULTIVITAMIN) tablet Take 1 tablet by mouth daily.     sertraline (ZOLOFT) 100 MG tablet Take 1/2 tablet daily for 5 days, then 1 tablet daily for 5 days, then 1.5 tablets daily 45 tablet 1   SPRAVATO, 84 MG DOSE, 28 MG/DEVICE SOPK USE 3 SPRAY IN EACH NOSTRIL EVERY 3  (THREE) DAYS. 3 each 5   traZODone (DESYREL) 100 MG tablet Take 1 tablet (100 mg total) by mouth at bedtime. Take 1-2 tabs po QHS prn 180 tablet 1   valACYclovir (VALTREX) 500 MG tablet Take one twice daily for 3 days with any symptoms 30 tablet 3   escitalopram (LEXAPRO) 20 MG tablet Take 1/2 tablet daily for 7 days, then stop (Patient not taking: Reported on 07/09/2022) 30 tablet 2   gabapentin (NEURONTIN) 100 MG capsule Take 1 capsule (100 mg total) by mouth 3 (three) times daily as needed. Take 1 capsule three times daily 90 capsule 1   No current facility-administered medications for this visit.    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   TRANSAMINASES, SERUM, ELEVATED 08/14/2009    Past Medical History, Surgical history, Social history, and Family history were reviewed and updated as appropriate.   Please see review of systems for further details on the patient's review from today.   Objective:   Physical Exam:  LMP 12/27/2007 (Exact Date)   Physical Exam Constitutional:      General: She is not in acute distress. Musculoskeletal:        General: No deformity.  Neurological:     Mental Status: She is alert and oriented to person, place, and time.     Coordination: Coordination normal.  Psychiatric:        Attention and Perception: Attention and perception normal. She does not perceive auditory or visual hallucinations.        Mood and Affect: Mood is anxious and depressed. Affect is not labile, blunt or angry.        Speech: Speech normal.        Behavior: Behavior normal.        Thought  Content: Thought content normal. Thought content is not delusional. Thought content does not include homicidal or suicidal ideation. Thought content does not include suicidal plan.        Cognition and Memory: Cognition and memory normal.        Judgment: Judgment normal.     Comments: Insight intact Less intense depression by 85% usually but a little more the last couple of days perhaps related to med transitons     Lab Review:     Component Value Date/Time   NA 138 04/22/2022 0932   K 4.1 04/22/2022 0932   CL 98 04/22/2022 0932  CO2 31 04/22/2022 0932   GLUCOSE 102 (H) 04/22/2022 0932   BUN 10 04/22/2022 0932   CREATININE 0.71 04/22/2022 0932   CREATININE 0.68 01/26/2020 0920   CALCIUM 10.5 04/22/2022 0932   PROT 8.3 04/22/2022 0932   ALBUMIN 5.3 (H) 04/22/2022 0932   AST 82 (H) 04/22/2022 0932   ALT 118 (H) 04/22/2022 0932   ALKPHOS 74 04/22/2022 0932   BILITOT 0.3 04/22/2022 0932   GFRNONAA >90 05/01/2014 1645   GFRAA >90 05/01/2014 1645       Component Value Date/Time   WBC 3.3 (L) 04/22/2022 0932   RBC 4.25 04/22/2022 0932   HGB 14.3 04/22/2022 0932   HGB 14.1 10/26/2014 1034   HCT 40.7 04/22/2022 0932   PLT 245.0 04/22/2022 0932   MCV 95.7 04/22/2022 0932   MCH 34.4 (H) 01/26/2020 0920   MCHC 35.1 04/22/2022 0932   RDW 13.2 04/22/2022 0932   LYMPHSABS 0.9 04/22/2022 0932   MONOABS 0.2 04/22/2022 0932   EOSABS 0.1 04/22/2022 0932   BASOSABS 0.0 04/22/2022 0932    No results found for: "POCLITH", "LITHIUM"   No results found for: "PHENYTOIN", "PHENOBARB", "VALPROATE", "CBMZ"   .res Assessment: Plan:    Heather Davila was seen today for follow-up, depression, anxiety and fatigue.  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  Patient was administered Spravato 84 mg intranasally today.  The patient experienced the typical dissociation which gradually resolved over the 2-hour period of observation.  There were no complications.  Specifically the patient did not have nausea or vomiting. But mild headache.  Blood pressures remained within normal ranges at the 40-minute and 2-hour follow-up intervals.  By the time the 2-hour observation period was met the patient was alert and oriented and able to exit without assistance.  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  See nursing note for further details.Tolerated the Spravato to '84mg'$  .  Regarding med options.  She has obviously been thoroughly tried on numerous medications.  The only antidepressant category that she has not taken is MAO inhibitors which are not compatible with Spravato.  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 Disc this with her previously.    Less intense depression by 85% usually but a little more the last couple of days perhaps related to med transitons Wants to continue the tx plan  Tolerating meds.  Satisfied she is making progress Continue transition to sertraline from Lexapro.  Consider other long acting MPH like Cotempla, Jornay PM, Daytrana bc she gets mood benefit with MPH and concerta is too short acting.    FU twice weekly Spravato until improvement maxes out then drop back to weekly.  She wants to   Lynder Parents, MD, DFAPA    Please see After Visit Summary for patient specific instructions.  Future Appointments  Date Time Provider [place]  07/29/2022  9:00 AM [name], PMHNP [facility] None  11/14/2022  9:55 AM Megan Salon, MD [facility]    No orders of the defined types were placed  in this encounter.   ------------------------------- T

## 2022-07-09 NOTE — Telephone Encounter (Signed)
She would like to keep Spravato twice weekly during her transition in antidepressant.  She mentioned she was more depressed in the last 24 to 48 hours then she had been.  She also mentioned that could be other factors such as changing her antidepressant dosage and changing the dosage of Concerta and the fact that she usually skips it.  She reports the Concerta helps her mood but it only lasts for hours or so and then the crash is unpleasant and dysphoric.  So she has often been skipping the Concerta.  Consider potentially longer smoother acting methylphenidate products than Concerta both to help ADD symptoms as well as to help mood symptoms.  Examples could include Jornay, Cotempla, and Daytrana if it could get it covered by insurance.

## 2022-07-09 NOTE — Telephone Encounter (Signed)
Pt scheduled for twice a week next week.

## 2022-07-09 NOTE — Telephone Encounter (Signed)
Pt is here this morning for her 16 th Spravato Treatment, her first treatment was 56 mg but all others have been 84 mg. Dr. Clovis Pu will touch base with pt after her treatment this morning but pt has reported yesterday she noticed she was more depressed. It has been discussed she would go down to once a week starting next week with her Spravato treatments, she is scheduled next Thursday the 21 st. She has also had a couple of changes with oral medications, she recently decreased her Concerta to 27 mg the end of March around the 28 th due to side effects. Pt also tapered off Lexapro 20 mg and started Zoloft this was on 07/01/2022 at her last apt with [name]. She is currently at 100 mg daily but will be going up to 150 mg soon. Pt is concerned about going down to once a week with Spravato, I did reassure her that we can certainty go back to twice a week if needed. Informed her sometimes other patients may decrease down but may have to increase dosing temporarily. Also discussed with her other medication changes this could all be a factor to look at. Her insurance has approved her Spravato for twice a week currently.   Informed her I would discuss with Dr. Clovis Pu as well as [name] and see what they both are recommending. Reassured her if over the weekend she starts declining or feeling worse she can contact me and I can get her in for a treatment on Monday the 18th if needed. Patient felt better about that possibility and that decreased her anxiety some. Pt has significantly improved since starting Spravato and the goal is to continue that improvement as well as get her stabilized consistently.

## 2022-07-09 NOTE — Progress Notes (Signed)
NURSES NOTE:     Patient arrived for her 16th Spravato treatment. Pt is being treated for Treatment Resistant Depression, the starting dose is 56 mg (2 of the 28 mg) nasal sprays. Pt will be receiving 84 mg, this is her maintenance dose. Patient taken to treatment room, I explained and discussed her treatment and how the schedule should go, along with any side effects that may occur. Answered any questions and concerns the patient had. Pt's Spravato is delivered through [place] and she is now billing with her insurance. Spravato medication is stored at doctors office per REMS/FDA guidelines. The medication is required to be locked behind two doors per FDA/REMS Protocol. Medication is also disposed of properly per regulations.      Vital Signs assessed at 8:05 AM 105/66, pulse 59, pulse ox 92%. Instructed pt to blow her nose and recline back slightly to prevent any of medication dripping out of her nose. Pt did report feeling depressed yesterday, we discussed her medication changes recently and she is concerned about decreasing Spravato treatments to once a week. Informed her I would discuss with Dr. Clovis Pu and [name] and get their recommendation. See note.  Pt. Given 1st dose (28 mg inhaler), then 5 minutes between 2nd dose and 3rd dose. No complaints of nausea/vomiting reported. Pt did listen to music today, she just reclined back and closed her eyes. After 40 minutes I went to assess her vital signs, no side effects or symptoms reported, B/P 112/65, pulse 58. Informed pt could rest in the chair Dr. Clovis Pu came in to discuss her treatment at the end. She will continue twice a week Spravato treatments. Nurse was with pt a total of 60 minutes but pt observed for 120 minutes per protocol. Discharge vital signs were at 10:00 AM, B/P 115/62, pulse 62. She reports dissociation she was clear upon her discharge, she left with her husband. She scheduled her next apt Monday, March 18th. Advised to contact me with any  concerns or issues prior to her apt next week.    LOT # [id number] EXP JAN [phone]

## 2022-07-09 NOTE — Progress Notes (Signed)
Heather Davila [id number] [birth year] 62 y.o.  Subjective:   Patient ID:  Heather Davila is a 62 y.o. (DOB 02-13-1961) female.  Chief Complaint:  Chief Complaint  Patient presents with   Follow-up   Depression   Fatigue     HPI Heather Davila presents to the office today for follow-up of treatment resistant recurrent depression, generalized anxiety disorder and insomnia.  Almost too numerous to count med failures. She is here for Spravato administration for her treatment resistant depression.  05/21/22 appt noted: Patient was administered first dose Spravato 56 mg intranasally today.  The patient experienced the typical dissociation which gradually resolved over the 2-hour period of observation.  There were no complications.  Specifically the patient did not have nausea or vomiting or headache.   Dissociation was mild. Did not experience any emotional relief from disabling depression.wants to increase Spravato to the usual dose.  She is aware insurance is not covering the costs at this time.  No SI acutely. Tolerating meds.    05/23/22 appt noted: Patient was administered Spravato 84 mg intranasally today.  The patient experienced the typical dissociation which gradually resolved over the 2-hour period of observation.  There were no complications.  Specifically the patient did not have nausea or vomiting.   Dissociation was greater than last dose and a little bothersome DT some HA. Tolerating meds.   Mood has not changed significantly thus far with Spravato.  05/27/22 appt noted: Patient was administered Spravato 84 mg intranasally today.  The patient experienced the typical dissociation which gradually resolved over the 2-hour period of observation.  There were no complications.  Specifically the patient did not have nausea or vomiting or headache. Today she has felt a little relief from the pressing heaviness of depression but a little more anxiety.  During administration of Spravato she  listens to [place] and was able to relax a little more with the feeling of dissociation that was mildly bothersome. Husband was also in on the session today and asked some questions about IV ketamine versus nasal spray ketamine.  05/29/22 appt noted: Cancelled today DT hypertension  05/30/22 appt noted: Patient was administered Spravato 84 mg intranasally today.  The patient experienced the typical dissociation which gradually resolved over the 2-hour period of observation.  There were no complications.  Specifically the patient did not have nausea or vomiting or headache. She is seeing some improvement in mood with less continuous depression and less intensity.  Hopefulness is better.   No med complaints or SE  06/05/22 appt noted: Patient was administered Spravato 84 mg intranasally today.  The patient experienced the typical dissociation which gradually resolved over the 2-hour period of observation.  There were no complications.  Specifically the patient did not have nausea or vomiting or headache.  Specifically HA is less of a problem with Spravato. No SE with meds. Depression is improving with less intensity.  Intermittent anxiety easily.  More able to enjoy things.  No new concerns.  06/17/22 appt noted: Patient was administered Spravato 84 mg intranasally today.  The patient experienced the typical dissociation which gradually resolved over the 2-hour period of observation.  There were no complications.  Specifically the patient did not have nausea or vomiting or headache.  Specifically HA is less of a problem with Spravato. No SE with meds. Depression is improved 45% with less intensity.  Intermittent anxiety less easily.  More able to enjoy things.  No new concerns.  More active. Current meds: increased clonazepam to  about 1.5 mg HS DT recent insomnia, lexapro 20, Dayvigo 10 mg HS, concerta 36 mg AM, trazodone 100 mg HS.  06/20/22 appt noted: Current meds: increased clonazepam to  about 1.5 mg HS DT recent insomnia, lexapro 20, Dayvigo 10 mg HS, concerta 36 mg AM, trazodone 100 mg HS. Patient was administered Spravato 84 mg intranasally today.  The patient experienced the typical dissociation which gradually resolved over the 2-hour period of observation.  There were no complications.  Specifically the patient did not have nausea or vomiting or headache.  Specifically HA is less of a problem with Spravato. No SE with meds. Depression is improved 45% with less intensity.  Intermittent anxiety less easily.  More able to enjoy things.  No new concerns.  More active.  06/23/22 appt noted:  Current meds: increased clonazepam to about 1.5 mg HS DT recent insomnia, lexapro 20, Dayvigo 10 mg HS, concerta 36 mg AM, trazodone 100 mg HS. Patient was administered Spravato 84 mg intranasally today.  The patient experienced the typical dissociation which gradually resolved over the 2-hour period of observation.  There were no complications.  Specifically the patient did not have nausea or vomiting or headache.  Specifically HA is less of a problem with Spravato. No SE with meds. Intensity of Spravato dissociation varies.  Last week very intesne and this week more typical.  Depression is continuing to improve with better interest and activity and motivation.  Gradual improvement.  Wants to continue.  06/25/22 appt noted: Current meds: increased clonazepam to about 1.5 mg HS DT recent insomnia, lexapro 20, Dayvigo 10 mg HS, concerta 36 mg AM, trazodone 100 mg HS. Patient was administered Spravato 84 mg intranasally today.  The patient experienced the typical dissociation which gradually resolved over the 2-hour period of observation.  There were no complications.  Specifically the patient did not have nausea or vomiting or headache.  Specifically HA is less of a problem with Spravato. No SE with meds. Intensity of Spravato dissociation varies.  No scary and well tolerated. Continues to  gradually improve with Spravato.  She is more motivated and enjoying things more.  H sees progress.  Wants to continue twice weekly until gets maximum response.  Dep is not gone yet.  06/30/22 appt noted:  seen with H Current meds: increased clonazepam to about 1.5 mg HS DT recent insomnia, lexapro 20, Dayvigo 10 mg HS, concerta 36 mg AM, trazodone 100 mg HS. Patient was administered Spravato 84 mg intranasally today.  The patient experienced the typical dissociation which gradually resolved over the 2-hour period of observation.  There were no complications.  Specifically the patient did not have nausea or vomiting or headache.  Specifically HA is less of a problem with Spravato. No SE with meds. Intensity of Spravato dissociation varies.  No scary and well tolerated. Continues to gradually improve with Spravato.  She is more motivated and enjoying things more.  H sees even more progress than she does; more active and smiling.  Wants to continue twice weekly until gets maximum response.  Dep is 80% better.  07/02/22 appt noted: Current meds: increased clonazepam to about 1.5 mg HS DT recent insomnia, lexapro 20, Dayvigo 10 mg HS, concerta 36 mg AM, trazodone 100 mg HS. Patient was administered Spravato 84 mg intranasally today.  The patient experienced the typical dissociation which gradually resolved over the 2-hour period of observation.  There were no complications.  Specifically the patient did not have nausea or vomiting or headache.  Specifically HA is less of a problem with Spravato. No SE with meds. Intensity of Spravato dissociation varies.  No scary and well tolerated. Continues to gradually improve with Spravato.  She is more motivated and enjoying things more.  H sees even more progress than she does; more active and smiling.  Wants to continue twice weekly until gets maximum response.  Dep is 80+% better.  Past Psychiatric Medication Trials: Trintellix - Initially effective and then was  not effective when re-started Paxil- Ineffective. Helped initially. Prozac-Insomnia Lexapro- Effective and then no longer as effective Zoloft- Initially effective and then not as effective.  Viibryd Cymbalta- Ineffective. Helped initially. Effexor XR- Effective, well tolerated. Pristiq- Increased anxiety at 150 mg daily Wellbutrin XL-Ineffective.May have increased anxiety. Amitriptyline Nortriptyline-Caused irritability, constipation Auvelity- ineffective Abilify- Helpful but caused severe insomnia. Rexulti-Helpful but caused insomnia. Vraylar Seroquel - Ineffective Risperdal Olanzapine- Ineffective Latuda- Ineffective Caplyta Klonopin- Effective Temazepam- Took during menopause Lunesta- Ineffective Ambien-Ineffective Sonata- Ineffective Dayvigo- partially effective Trazodone- Ineffective Remeron-Ineffective. Does not recall wt gain.  Adderall- Took once and had adverse effect. Concerta- Effective Metadate-Not as effective as Concerta Dexmethylphenidate- Not as effective as Concerta Azstarys- some side effects. Not as effective.  Lamictal- May have had an adverse effects. "Felt weird." Reports worsening s/s.  Lithium Gabapentin Pramipexole- "felt really weird."   ECT #20 with minimal response. H says it was partly helpful.  AIMS    Flowsheet Row Video Visit from 01/03/2022 in [place] Total Score 0      ECT-MADRS    Flowsheet Row Video Visit from 04/29/2022 in [facility] Video Visit from 02/06/2022 in [facility]  MADRS Total Score 44 23      PHQ2-9    Flowsheet Row Video Visit from 02/06/2022 in [place] Office Visit from 11/16/2020 in [facility] for [facility] [place] Visit from 04/19/2018 in [place] at [facility] Total Score 6 0 0  PHQ-9 Total Score 14 -- 2         Review of Systems:  Review of Systems  Constitutional:  Negative for fatigue.  Cardiovascular:  Negative for palpitations.  Neurological:  Negative for tremors.  Psychiatric/Behavioral:  Positive for dysphoric mood. Negative for decreased concentration. The patient is not nervous/anxious.     Medications: I have reviewed the patient's current medications.  Current Outpatient Medications  Medication Sig Dispense Refill   clonazePAM (KLONOPIN) 0.5 MG tablet Take 1-1.5 tabs at bedtime and 1/2-1 tab as needed for anxiety. 60 tablet 1   doxycycline (VIBRAMYCIN) 50 MG capsule Take 50 mg by mouth daily.     escitalopram (LEXAPRO) 20 MG tablet Take 1/2 tablet daily for 7 days, then stop (Patient not taking: Reported on 07/09/2022) 30 tablet 2   estradiol (ESTRACE) 0.1 MG/GM vaginal cream USE 1 GM VAGINALLY TWICE A WEEK AT BEDTIME 42.5 g 0   gabapentin (NEURONTIN) 100 MG capsule Take 1 capsule (100 mg total) by mouth 3 (three) times daily as needed. Take 1 capsule three times daily 90 capsule 1   gabapentin (NEURONTIN) 800 MG tablet Take 1 tablet (800 mg total) by mouth at bedtime. 90 tablet 1   Lemborexant (DAYVIGO) 10 MG TABS Take 1/2-1 tab po QHS 30 tablet 2   MAGNESIUM PO Take by mouth.     methylphenidate (CONCERTA) 27 MG PO CR tablet Take 1 tablet (27 mg total) by mouth every  morning. 30 tablet 0   Multiple Vitamin (MULTIVITAMIN) tablet Take 1 tablet by mouth daily.     sertraline (ZOLOFT) 100 MG tablet Take 1/2 tablet daily for 5 days, then 1 tablet daily for 5 days, then 1.5 tablets daily 45 tablet 1   SPRAVATO, 84 MG DOSE, 28 MG/DEVICE SOPK USE 3 SPRAY IN EACH NOSTRIL EVERY 3 (THREE) DAYS. 3 each 5   traZODone (DESYREL) 100 MG tablet Take 1 tablet (100 mg total) by mouth at bedtime. Take 1-2 tabs po QHS prn 180 tablet 1   valACYclovir (VALTREX) 500 MG tablet Take one twice daily for 3 days with any symptoms 30 tablet 3   No current facility-administered medications for this visit.     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   TRANSAMINASES, SERUM, ELEVATED 08/14/2009    Past Medical History, Surgical history, Social history, and Family history were reviewed and updated as appropriate.   Please see review of systems for further details on the patient's review from today.   Objective:   Physical Exam:  LMP 12/27/2007 (Exact Date)   Physical Exam Constitutional:      General: She is not in acute distress. Musculoskeletal:        General: No deformity.  Neurological:     Mental Status: She is alert and oriented to person, place, and time.     Coordination: Coordination normal.  Psychiatric:        Attention and Perception: Attention and perception normal. She does not perceive auditory or visual hallucinations.        Mood and Affect: Mood is anxious and depressed. Affect is not labile, blunt or angry.        Speech: Speech normal.        Behavior: Behavior normal.        Thought Content: Thought content normal. Thought content is not delusional. Thought content does not include homicidal or suicidal ideation. Thought content does not include suicidal plan.        Cognition and Memory: Cognition and memory normal.        Judgment: Judgment normal.     Comments: Insight intact Less intense depression by 85%  Some smiling     Lab Review:     Component Value Date/Time   NA 138 04/22/2022 0932   K 4.1 04/22/2022 0932   CL 98 04/22/2022 0932   CO2 31 04/22/2022 0932   GLUCOSE 102 (H) 04/22/2022 0932   BUN 10 04/22/2022 0932   CREATININE 0.71 04/22/2022 0932   CREATININE  0.68 01/26/2020 0920   CALCIUM 10.5 04/22/2022 0932   PROT 8.3 04/22/2022 0932   ALBUMIN 5.3 (H) 04/22/2022 0932   AST 82 (H) 04/22/2022 0932   ALT 118 (H) 04/22/2022 0932   ALKPHOS 74 04/22/2022 0932   BILITOT 0.3 04/22/2022 0932   GFRNONAA >90 05/01/2014 1645   GFRAA >90 05/01/2014 1645       Component Value Date/Time   WBC 3.3 (L) 04/22/2022 0932   RBC 4.25 04/22/2022  0932   HGB 14.3 04/22/2022 0932   HGB 14.1 10/26/2014 1034   HCT 40.7 04/22/2022 0932   PLT 245.0 04/22/2022 0932   MCV 95.7 04/22/2022 0932   MCH 34.4 (H) 01/26/2020 0920   MCHC 35.1 04/22/2022 0932   RDW 13.2 04/22/2022 0932   LYMPHSABS 0.9 04/22/2022 0932   MONOABS 0.2 04/22/2022 0932   EOSABS 0.1 04/22/2022 0932   BASOSABS 0.0 04/22/2022 0932    No results found for: "POCLITH", "LITHIUM"   No results found for: "PHENYTOIN", "PHENOBARB", "VALPROATE", "CBMZ"   .res Assessment: Plan:    Luddie was seen today for follow-up, depression and fatigue.  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  Patient was administered Spravato 84 mg intranasally today.  The patient experienced the typical dissociation which gradually resolved over the 2-hour period of observation.  There were no complications.  Specifically the patient did not have nausea or vomiting. But mild headache.  Blood pressures remained within normal ranges at the 40-minute and 2-hour follow-up intervals.  By the time the 2-hour observation period was met the patient was alert and oriented and able to exit without assistance.  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  See nursing note for further  details.Tolerated the Spravato to '84mg'$  .  Regarding med options.  She has obviously been thoroughly tried on numerous medications.  The only antidepressant category that she has not taken is MAO inhibitors which are not compatible with Spravato.  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 Disc this with her previously.    Today she was 85% less depressed and more hopeful.  She and H very pleased. Wants to continue the tx plan  Tolerating meds.  Satisfied she is making progress No med changes today  FU twice weekly Spravato until improvement maxes out then drop back to weekly.  Lynder Parents, MD, DFAPA    Please see After Visit Summary for patient specific instructions.  Future Appointments  Date Time Provider [place]  07/29/2022  9:00 AM [name], PMHNP [facility] None  11/14/2022  9:55 AM Megan Salon, MD [facility]    No orders of the defined types were placed in this encounter.   ------------------------------- T

## 2022-07-10 ENCOUNTER — Other Ambulatory Visit: Payer: Self-pay | Admitting: Psychiatry

## 2022-07-10 DIAGNOSIS — F339 Major depressive disorder, recurrent, unspecified: Secondary | ICD-10-CM

## 2022-07-10 NOTE — Telephone Encounter (Signed)
Please send

## 2022-07-10 NOTE — Telephone Encounter (Signed)
These recommendations sound reasonable based on you all's latest observations. Agree with whatever medication changes you all consider appropriate in conjunction with her Spravato treatments since she is being frequently followed by you all. Will be glad to resume management of her care once she has completed Spravato treatments. Thank you.

## 2022-07-14 ENCOUNTER — Ambulatory Visit: Payer: 59

## 2022-07-14 ENCOUNTER — Ambulatory Visit (INDEPENDENT_AMBULATORY_CARE_PROVIDER_SITE_OTHER): Payer: 59 | Admitting: Psychiatry

## 2022-07-14 ENCOUNTER — Encounter: Payer: Self-pay | Admitting: Psychiatry

## 2022-07-14 ENCOUNTER — Telehealth: Payer: Self-pay

## 2022-07-14 VITALS — BP 100/58 | HR 65

## 2022-07-14 DIAGNOSIS — F339 Major depressive disorder, recurrent, unspecified: Secondary | ICD-10-CM

## 2022-07-14 DIAGNOSIS — G4700 Insomnia, unspecified: Secondary | ICD-10-CM | POA: Diagnosis not present

## 2022-07-14 DIAGNOSIS — F411 Generalized anxiety disorder: Secondary | ICD-10-CM

## 2022-07-14 DIAGNOSIS — F900 Attention-deficit hyperactivity disorder, predominantly inattentive type: Secondary | ICD-10-CM | POA: Diagnosis not present

## 2022-07-14 DIAGNOSIS — F419 Anxiety disorder, unspecified: Secondary | ICD-10-CM

## 2022-07-14 MED ORDER — JORNAY PM 20 MG PO CP24: 1.0000 | ORAL_CAPSULE | Freq: Every evening | ORAL | 0 refills | Status: DC

## 2022-07-14 MED ORDER — CLONAZEPAM 0.5 MG PO TABS: ORAL_TABLET | ORAL | 1 refills | Status: DC

## 2022-07-14 NOTE — Telephone Encounter (Signed)
Pt arrived for her Spravato treatment and reports she is not doing well. Her last treatment was on 07/09/22 and she reported then her depression seemed to be worsening. Now she reports over the weekend her depression worsened even more so she stopped Zoloft 100 mg and went straight back to Lexapro 20 mg this is on Saturday the 16th when she restarted that. She hasn't been taking the Concerta because it wears off after 4 hours and she doesn't like how it makes her feel when it wears off. I asked her if it did that when she was on the previous dose of 36 mg but she couldn't remember. She does know it helps with depression so she said she is going to restart it but did ask about switching to the patch, previously mentioned by Dr. Clovis Pu. Her son did have a procedure on Thursday which can obviously be contributing to her worsening depression.   Informed her I would update Dr. Clovis Pu and [name] and see how they wanted to proceed.

## 2022-07-14 NOTE — Progress Notes (Signed)
Heather Davila [id number] 18-Aug-1960 62 y.o.  Subjective:   Patient ID:  Heather Davila is a 62 y.o. (DOB [birth year]) female.  Chief Complaint:  Chief Complaint  Patient presents with   Follow-up   Depression   ADD   Fatigue   Stress   Sleeping Problem    HPI Heather Davila presents to the office today for follow-up of treatment resistant recurrent depression, generalized anxiety disorder and insomnia.  Almost too numerous to count med failures. She is here for Spravato administration for her treatment resistant depression.  05/21/22 appt noted: Patient was administered first dose Spravato 56 mg intranasally today.  The patient experienced the typical dissociation which gradually resolved over the 2-hour period of observation.  There were no complications.  Specifically the patient did not have nausea or vomiting or headache.   Dissociation was mild. Did not experience any emotional relief from disabling depression.wants to increase Spravato to the usual dose.  She is aware insurance is not covering the costs at this time.  No SI acutely. Tolerating meds.    05/23/22 appt noted: Patient was administered Spravato 84 mg intranasally today.  The patient experienced the typical dissociation which gradually resolved over the 2-hour period of observation.  There were no complications.  Specifically the patient did not have nausea or vomiting.   Dissociation was greater than last dose and a little bothersome DT some HA. Tolerating meds.   Mood has not changed significantly thus far with Spravato.  05/27/22 appt noted: Patient was administered Spravato 84 mg intranasally today.  The patient experienced the typical dissociation which gradually resolved over the 2-hour period of observation.  There were no complications.  Specifically the patient did not have nausea or vomiting or headache. Today she has felt a little relief from the pressing heaviness of depression but a little more anxiety.   During administration of Spravato she listens to [place] and was able to relax a little more with the feeling of dissociation that was mildly bothersome. Husband was also in on the session today and asked some questions about IV ketamine versus nasal spray ketamine.  05/29/22 appt noted: Cancelled today DT hypertension  05/30/22 appt noted: Patient was administered Spravato 84 mg intranasally today.  The patient experienced the typical dissociation which gradually resolved over the 2-hour period of observation.  There were no complications.  Specifically the patient did not have nausea or vomiting or headache. She is seeing some improvement in mood with less continuous depression and less intensity.  Hopefulness is better.   No med complaints or SE  06/05/22 appt noted: Patient was administered Spravato 84 mg intranasally today.  The patient experienced the typical dissociation which gradually resolved over the 2-hour period of observation.  There were no complications.  Specifically the patient did not have nausea or vomiting or headache.  Specifically HA is less of a problem with Spravato. No SE with meds. Depression is improving with less intensity.  Intermittent anxiety easily.  More able to enjoy things.  No new concerns.  06/17/22 appt noted: Patient was administered Spravato 84 mg intranasally today.  The patient experienced the typical dissociation which gradually resolved over the 2-hour period of observation.  There were no complications.  Specifically the patient did not have nausea or vomiting or headache.  Specifically HA is less of a problem with Spravato. No SE with meds. Depression is improved 45% with less intensity.  Intermittent anxiety less easily.  More able to enjoy things.  No new  concerns.  More active. Current meds: increased clonazepam to about 1.5 mg HS DT recent insomnia, lexapro 20, Dayvigo 10 mg HS, concerta 36 mg AM, trazodone 100 mg HS.  06/20/22 appt  noted: Current meds: increased clonazepam to about 1.5 mg HS DT recent insomnia, lexapro 20, Dayvigo 10 mg HS, concerta 36 mg AM, trazodone 100 mg HS. Patient was administered Spravato 84 mg intranasally today.  The patient experienced the typical dissociation which gradually resolved over the 2-hour period of observation.  There were no complications.  Specifically the patient did not have nausea or vomiting or headache.  Specifically HA is less of a problem with Spravato. No SE with meds. Depression is improved 45% with less intensity.  Intermittent anxiety less easily.  More able to enjoy things.  No new concerns.  More active.  06/23/22 appt noted:  Current meds: increased clonazepam to about 1.5 mg HS DT recent insomnia, lexapro 20, Dayvigo 10 mg HS, concerta 36 mg AM, trazodone 100 mg HS. Patient was administered Spravato 84 mg intranasally today.  The patient experienced the typical dissociation which gradually resolved over the 2-hour period of observation.  There were no complications.  Specifically the patient did not have nausea or vomiting or headache.  Specifically HA is less of a problem with Spravato. No SE with meds. Intensity of Spravato dissociation varies.  Last week very intesne and this week more typical.  Depression is continuing to improve with better interest and activity and motivation.  Gradual improvement.  Wants to continue.  06/25/22 appt noted: Current meds: increased clonazepam to about 1.5 mg HS DT recent insomnia, lexapro 20, Dayvigo 10 mg HS, concerta 36 mg AM, trazodone 100 mg HS. Patient was administered Spravato 84 mg intranasally today.  The patient experienced the typical dissociation which gradually resolved over the 2-hour period of observation.  There were no complications.  Specifically the patient did not have nausea or vomiting or headache.  Specifically HA is less of a problem with Spravato. No SE with meds. Intensity of Spravato dissociation varies.  No  scary and well tolerated. Continues to gradually improve with Spravato.  She is more motivated and enjoying things more.  H sees progress.  Wants to continue twice weekly until gets maximum response.  Dep is not gone yet.  06/30/22 appt noted:  seen with H Current meds: increased clonazepam to about 1.5 mg HS DT recent insomnia, lexapro 20, Dayvigo 10 mg HS, concerta 36 mg AM, trazodone 100 mg HS. Patient was administered Spravato 84 mg intranasally today.  The patient experienced the typical dissociation which gradually resolved over the 2-hour period of observation.  There were no complications.  Specifically the patient did not have nausea or vomiting or headache.  Specifically HA is less of a problem with Spravato. No SE with meds. Intensity of Spravato dissociation varies.  No scary and well tolerated. Continues to gradually improve with Spravato.  She is more motivated and enjoying things more.  H sees even more progress than she does; more active and smiling.  Wants to continue twice weekly until gets maximum response.  Dep is 80% better.  07/07/22 appt noted: Current meds: increased clonazepam to about 1.5 mg HS DT recent insomnia, lexapro 20, Dayvigo 10 mg HS, concerta 36 mg AM, trazodone 100 mg HS. Patient was administered Spravato 84 mg intranasally today.  The patient experienced the typical dissociation which gradually resolved over the 2-hour period of observation.  There were no complications.  Specifically the patient  did not have nausea or vomiting or headache.  Specifically HA is less of a problem with Spravato. No SE with meds. Intensity of Spravato dissociation varies.  No scary and well tolerated.  Was more intese today. Overall mood is markedly better and please with meds. No changes desired.  07/09/22 appt noted: In transition from Lexapro to sertraline and missing Concerta bc CO crash from it after about 4 hours.  Disc these concerns.  She'll discuss also with [name],  NP More dep the last couple of days and concerned about reducing Spravato frequency while transition from one antidep to another.  Affect less positive. Patient was administered Spravato 84 mg intranasally today.  The patient experienced the typical dissociation which gradually resolved over the 2-hour period of observation.  There were no complications.  Specifically the patient did not have nausea or vomiting or headache.  Specifically HA is less of a problem with Spravato. No SE with meds. Intensity of Spravato dissociation varies.  No scary and well tolerated.   07/14/22 appt noted: Has been more depressed this week.  More trouble with sleep.  Taking clonazepam 1-1.5  of the 0.5 mg tablets.   Trazodone not helping much. She switched back from 200 sertraline to Lexapro 20.  No SE More trouble with motivation.  Some stress with son with special needs. Misses the benevit of Concerta but it was too short acting and crashed in 4-6 hours. Patient was administered Spravato 84 mg intranasally today.  The patient experienced the typical dissociation which gradually resolved over the 2-hour period of observation.  There were no complications.  Specifically the patient did not have nausea or vomiting or headache.  Specifically HA is less of a problem with Spravato. No SE with meds. Intensity of Spravato dissociation varies.  No scary and well tolerated.   Past Psychiatric Medication Trials: Trintellix - Initially effective and then was not effective when re-started Paxil- Ineffective. Helped initially. Prozac-Insomnia Lexapro- Effective and then no longer as effective Zoloft- Initially effective and then not as effective.  Viibryd Cymbalta- Ineffective. Helped initially. Effexor XR- Effective, well tolerated. Pristiq- Increased anxiety at 150 mg daily Wellbutrin XL-Ineffective.May have increased anxiety. Amitriptyline Nortriptyline-Caused irritability, constipation Auvelity- ineffective Abilify-  Helpful but caused severe insomnia. Rexulti-Helpful but caused insomnia. Vraylar Seroquel - Ineffective Risperdal Olanzapine- Ineffective Latuda- Ineffective Caplyta Klonopin- Effective Temazepam- Took during menopause Lunesta- Ineffective Ambien-Ineffective Sonata- Ineffective Dayvigo- partially effective Trazodone- Ineffective Remeron-Ineffective. Does not recall wt gain.  Adderall- Took once and had adverse effect. Concerta- Effective but only 4-6 hours Metadate-Not as effective as Concerta Dexmethylphenidate- Not as effective as Concerta Azstarys- some side effects. Not as effective.  Lamictal- May have had an adverse effects. "Felt weird." Reports worsening s/s.  Lithium Gabapentin Pramipexole- "felt really weird."   ECT #20 with minimal response. H says it was partly helpful.  AIMS    Flowsheet Row Video Visit from 01/03/2022 in [place] Total Score 0      ECT-MADRS    Flowsheet Row Video Visit from 04/29/2022 in [place] Video Visit from 02/06/2022 in [facility]  MADRS Total Score 44 23      PHQ2-9    Flowsheet Row Video Visit from 02/06/2022 in [place] Office Visit from 11/16/2020 in [facility] for [facility] [place] Visit from 04/19/2018 in [place] [facility] Total Score 6 0 0  PHQ-9 Total Score 14 -- 2        Review of Systems:  Review of Systems  Constitutional:  Positive for fatigue.  Cardiovascular:  Negative for palpitations.  Psychiatric/Behavioral:  Positive for dysphoric mood. Negative for agitation and decreased concentration. The patient is not nervous/anxious.     Medications: I have reviewed the patient's current medications.  Current Outpatient Medications  Medication Sig Dispense Refill   clonazePAM (KLONOPIN) 0.5 MG tablet Take 1-1.5 tabs  at bedtime and 1/2-1 tab as needed for anxiety. 60 tablet 1   doxycycline (VIBRAMYCIN) 50 MG capsule Take 50 mg by mouth daily.     escitalopram (LEXAPRO) 20 MG tablet Take 1/2 tablet daily for 7 days, then stop 30 tablet 2   Esketamine HCl, 84 MG Dose, (SPRAVATO, 84 MG DOSE,) 28 MG/DEVICE SOPK USE 3 SPRAYS IN EACH NOSTRIL 3 DAYS 3 each 2   estradiol (ESTRACE) 0.1 MG/GM vaginal cream USE 1 GM VAGINALLY TWICE A WEEK AT BEDTIME 42.5 g 0   gabapentin (NEURONTIN) 800 MG tablet Take 1 tablet (800 mg total) by mouth at bedtime. 90 tablet 1   Lemborexant (DAYVIGO) 10 MG TABS Take 1/2-1 tab po QHS 30 tablet 2   MAGNESIUM PO Take by mouth.     Methylphenidate HCl ER, PM, (JORNAY PM) 20 MG CP24 Take 1 capsule (20 mg total) by mouth every evening. 30 capsule 0   Multiple Vitamin (MULTIVITAMIN) tablet Take 1 tablet by mouth daily.     traZODone (DESYREL) 100 MG tablet Take 1 tablet (100 mg total) by mouth at bedtime. Take 1-2 tabs po QHS prn 180 tablet 1   valACYclovir (VALTREX) 500 MG tablet Take one twice daily for 3 days with any symptoms 30 tablet 3   gabapentin (NEURONTIN) 100 MG capsule Take 1 capsule (100 mg total) by mouth 3 (three) times daily as needed. Take 1 capsule three times daily 90 capsule 1   sertraline (ZOLOFT) 100 MG tablet Take 1/2 tablet daily for 5 days, then 1 tablet daily for 5 days, then 1.5 tablets daily (Patient not taking: Reported on 07/14/2022) 45 tablet 1   No current facility-administered medications for this visit.    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   TRANSAMINASES, SERUM, ELEVATED  08/14/2009    Past Medical History, Surgical history, Social history, and Family history were reviewed and updated as appropriate.   Please see review of systems for further details on the patient's review from today.   Objective:   Physical Exam:  LMP 12/27/2007 (Exact Date)   Physical Exam Constitutional:      General: She is not in acute distress. Musculoskeletal:        General: No deformity.  Neurological:     Mental Status: She is alert and oriented to person, place, and time.     Coordination: Coordination normal.  Psychiatric:        Attention and Perception: Attention and perception normal. She does not perceive auditory or visual hallucinations.  Mood and Affect: Mood is anxious and depressed. Affect is blunt. Affect is not labile or angry.        Speech: Speech normal.        Behavior: Behavior normal.        Thought Content: Thought content normal. Thought content is not delusional. Thought content does not include homicidal or suicidal ideation. Thought content does not include suicidal plan.        Cognition and Memory: Cognition and memory normal.        Judgment: Judgment normal.     Comments: Insight intact More down the last week.     Lab Review:     Component Value Date/Time   NA 138 04/22/2022 0932   K 4.1 04/22/2022 0932   CL 98 04/22/2022 0932   CO2 31 04/22/2022 0932   GLUCOSE 102 (H) 04/22/2022 0932   BUN 10 04/22/2022 0932   CREATININE 0.71 04/22/2022 0932   CREATININE 0.68 01/26/2020 0920   CALCIUM 10.5 04/22/2022 0932   PROT 8.3 04/22/2022 0932   ALBUMIN 5.3 (H) 04/22/2022 0932   AST 82 (H) 04/22/2022 0932   ALT 118 (H) 04/22/2022 0932   ALKPHOS 74 04/22/2022 0932   BILITOT 0.3 04/22/2022 0932   GFRNONAA >90 05/01/2014 1645   GFRAA >90 05/01/2014 1645       Component Value Date/Time   WBC 3.3 (L) 04/22/2022 0932   RBC 4.25 04/22/2022 0932   HGB 14.3 04/22/2022 0932   HGB 14.1 10/26/2014 1034   HCT 40.7 04/22/2022 0932    PLT 245.0 04/22/2022 0932   MCV 95.7 04/22/2022 0932   MCH 34.4 (H) 01/26/2020 0920   MCHC 35.1 04/22/2022 0932   RDW 13.2 04/22/2022 0932   LYMPHSABS 0.9 04/22/2022 0932   MONOABS 0.2 04/22/2022 0932   EOSABS 0.1 04/22/2022 0932   BASOSABS 0.0 04/22/2022 0932    No results found for: "POCLITH", "LITHIUM"   No results found for: "PHENYTOIN", "PHENOBARB", "VALPROATE", "CBMZ"   .res Assessment: Plan:    Christienne was seen today for follow-up, depression, add, fatigue, stress and sleeping problem.  Diagnoses and all orders for this visit:  Recurrent major depression resistant to treatment ([place])  Generalized anxiety disorder  Attention deficit hyperactivity disorder (ADHD), predominantly inattentive type -     Methylphenidate HCl ER, PM, (JORNAY PM) 20 MG CP24; Take 1 capsule (20 mg total) by mouth every evening.  Insomnia, unspecified type    Pt seen for TRD and administration of Spravato.  Patient was administered Spravato 84 mg intranasally today.  The patient experienced the typical dissociation which gradually resolved over the 2-hour period of observation.  There were no complications.  Specifically the patient did not have nausea or vomiting. But mild headache.  Blood pressures remained within normal ranges at the 40-minute and 2-hour follow-up intervals.  By the time the 2-hour observation period was met the patient was alert and oriented and able to exit without assistance.  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  See nursing note for further details.Tolerated the Spravato to 84mg  .  Regarding med options.  She has obviously been thoroughly tried on numerous medications.  The only antidepressant category that she has not taken is MAO inhibitors which are not compatible with Spravato.  The only other options with reasonable chance of success might be to use the most successful  antidepressants at higher than recommended maximum dose for treatment  resistant status. Disc this with her previously.    We discussed the short-term risks associated with benzodiazepines including sedation and increased fall risk among others.  Discussed long-term side effect risk including dependence, potential withdrawal symptoms, and the potential eventual dose-related risk of dementia.  But recent studies from 2020 dispute this association between benzodiazepines and dementia risk. Newer studies in 2020 do not support an association with dementia.  Less intense depression with Spravato but starting to relapse ? Re: stopping Concerta bc of the crash.   Wants to continue the tx plan  Tolerating meds.  Satisfied she is making progress Continue transition to sertraline from Lexapro.  She switched back to Lexapro with inadequate trial of sertraline.  Unclear which is best but will focus on sleep and getting stimulant doses solved.  Easiestr trial for insomnia, one further increase clonazepam 1 mg HS. razodone doesn't work.  Consider other long acting MPH like Cotempla, Jornay PM, Daytrana bc she gets mood benefit with MPH and concerta is too short acting.   Trial Jornapy PM for longer acting smooth distribution of MPH which helped ADD and mood.  FU twice weekly Spravato until improvement maxes out then drop back to weekly.  She wants to   Lynder Parents, MD, DFAPA    Please see After Visit Summary for patient specific instructions.  Future Appointments  Date Time Provider [place]  07/17/2022  8:00 AM CP-NURSE [facility] None  07/17/2022  9:00 AM Cottle, Billey Co., MD [facility] None  07/29/2022  9:00 AM [name], PMHNP CP-CP None  11/14/2022  9:55 AM Megan Salon, MD [facility]    No orders of the defined types were placed in this encounter.   ------------------------------- T

## 2022-07-14 NOTE — Telephone Encounter (Signed)
I will address these issues in my notes today.

## 2022-07-17 ENCOUNTER — Ambulatory Visit (INDEPENDENT_AMBULATORY_CARE_PROVIDER_SITE_OTHER): Payer: 59 | Admitting: Psychiatry

## 2022-07-17 ENCOUNTER — Ambulatory Visit: Payer: 59

## 2022-07-17 ENCOUNTER — Encounter: Payer: Self-pay | Admitting: Psychiatry

## 2022-07-17 VITALS — BP 123/83 | HR 61

## 2022-07-17 DIAGNOSIS — F339 Major depressive disorder, recurrent, unspecified: Secondary | ICD-10-CM | POA: Diagnosis not present

## 2022-07-17 DIAGNOSIS — G4700 Insomnia, unspecified: Secondary | ICD-10-CM | POA: Diagnosis not present

## 2022-07-17 DIAGNOSIS — F900 Attention-deficit hyperactivity disorder, predominantly inattentive type: Secondary | ICD-10-CM | POA: Diagnosis not present

## 2022-07-17 DIAGNOSIS — F411 Generalized anxiety disorder: Secondary | ICD-10-CM | POA: Diagnosis not present

## 2022-07-17 NOTE — Progress Notes (Signed)
Julyana Bedrick [id number] May 04, 1960 62 y.o.  Subjective:   Patient ID:  Heather Davila is a 62 y.o. (DOB [birth year]) female.  Chief Complaint:  Chief Complaint  Patient presents with   Follow-up   Depression   ADD    HPI Nareh Younghans presents to the office today for follow-up of treatment resistant recurrent depression, generalized anxiety disorder and insomnia.  Almost too numerous to count med failures. She is here for Spravato administration for her treatment resistant depression.  05/21/22 appt noted: Patient was administered first dose Spravato 56 mg intranasally today.  The patient experienced the typical dissociation which gradually resolved over the 2-hour period of observation.  There were no complications.  Specifically the patient did not have nausea or vomiting or headache.   Dissociation was mild. Did not experience any emotional relief from disabling depression.wants to increase Spravato to the usual dose.  She is aware insurance is not covering the costs at this time.  No SI acutely. Tolerating meds.    05/23/22 appt noted: Patient was administered Spravato 84 mg intranasally today.  The patient experienced the typical dissociation which gradually resolved over the 2-hour period of observation.  There were no complications.  Specifically the patient did not have nausea or vomiting.   Dissociation was greater than last dose and a little bothersome DT some HA. Tolerating meds.   Mood has not changed significantly thus far with Spravato.  05/27/22 appt noted: Patient was administered Spravato 84 mg intranasally today.  The patient experienced the typical dissociation which gradually resolved over the 2-hour period of observation.  There were no complications.  Specifically the patient did not have nausea or vomiting or headache. Today she has felt a little relief from the pressing heaviness of depression but a little more anxiety.  During administration of Spravato she  listens to [facility] and was able to relax a little more with the feeling of dissociation that was mildly bothersome. Husband was also in on the session today and asked some questions about IV ketamine versus nasal spray ketamine.  05/29/22 appt noted: Cancelled today DT hypertension  05/30/22 appt noted: Patient was administered Spravato 84 mg intranasally today.  The patient experienced the typical dissociation which gradually resolved over the 2-hour period of observation.  There were no complications.  Specifically the patient did not have nausea or vomiting or headache. She is seeing some improvement in mood with less continuous depression and less intensity.  Hopefulness is better.   No med complaints or SE  06/05/22 appt noted: Patient was administered Spravato 84 mg intranasally today.  The patient experienced the typical dissociation which gradually resolved over the 2-hour period of observation.  There were no complications.  Specifically the patient did not have nausea or vomiting or headache.  Specifically HA is less of a problem with Spravato. No SE with meds. Depression is improving with less intensity.  Intermittent anxiety easily.  More able to enjoy things.  No new concerns.  06/17/22 appt noted: Patient was administered Spravato 84 mg intranasally today.  The patient experienced the typical dissociation which gradually resolved over the 2-hour period of observation.  There were no complications.  Specifically the patient did not have nausea or vomiting or headache.  Specifically HA is less of a problem with Spravato. No SE with meds. Depression is improved 45% with less intensity.  Intermittent anxiety less easily.  More able to enjoy things.  No new concerns.  More active. Current meds: increased clonazepam to about  1.5 mg HS DT recent insomnia, lexapro 20, Dayvigo 10 mg HS, concerta 36 mg AM, trazodone 100 mg HS.  06/20/22 appt noted: Current meds: increased clonazepam to  about 1.5 mg HS DT recent insomnia, lexapro 20, Dayvigo 10 mg HS, concerta 36 mg AM, trazodone 100 mg HS. Patient was administered Spravato 84 mg intranasally today.  The patient experienced the typical dissociation which gradually resolved over the 2-hour period of observation.  There were no complications.  Specifically the patient did not have nausea or vomiting or headache.  Specifically HA is less of a problem with Spravato. No SE with meds. Depression is improved 45% with less intensity.  Intermittent anxiety less easily.  More able to enjoy things.  No new concerns.  More active.  06/23/22 appt noted:  Current meds: increased clonazepam to about 1.5 mg HS DT recent insomnia, lexapro 20, Dayvigo 10 mg HS, concerta 36 mg AM, trazodone 100 mg HS. Patient was administered Spravato 84 mg intranasally today.  The patient experienced the typical dissociation which gradually resolved over the 2-hour period of observation.  There were no complications.  Specifically the patient did not have nausea or vomiting or headache.  Specifically HA is less of a problem with Spravato. No SE with meds. Intensity of Spravato dissociation varies.  Last week very intesne and this week more typical.  Depression is continuing to improve with better interest and activity and motivation.  Gradual improvement.  Wants to continue.  06/25/22 appt noted: Current meds: increased clonazepam to about 1.5 mg HS DT recent insomnia, lexapro 20, Dayvigo 10 mg HS, concerta 36 mg AM, trazodone 100 mg HS. Patient was administered Spravato 84 mg intranasally today.  The patient experienced the typical dissociation which gradually resolved over the 2-hour period of observation.  There were no complications.  Specifically the patient did not have nausea or vomiting or headache.  Specifically HA is less of a problem with Spravato. No SE with meds. Intensity of Spravato dissociation varies.  No scary and well tolerated. Continues to  gradually improve with Spravato.  She is more motivated and enjoying things more.  H sees progress.  Wants to continue twice weekly until gets maximum response.  Dep is not gone yet.  06/30/22 appt noted:  seen with H Current meds: increased clonazepam to about 1.5 mg HS DT recent insomnia, lexapro 20, Dayvigo 10 mg HS, concerta 36 mg AM, trazodone 100 mg HS. Patient was administered Spravato 84 mg intranasally today.  The patient experienced the typical dissociation which gradually resolved over the 2-hour period of observation.  There were no complications.  Specifically the patient did not have nausea or vomiting or headache.  Specifically HA is less of a problem with Spravato. No SE with meds. Intensity of Spravato dissociation varies.  No scary and well tolerated. Continues to gradually improve with Spravato.  She is more motivated and enjoying things more.  H sees even more progress than she does; more active and smiling.  Wants to continue twice weekly until gets maximum response.  Dep is 80% better.  07/07/22 appt noted: Current meds: increased clonazepam to about 1.5 mg HS DT recent insomnia, lexapro 20, Dayvigo 10 mg HS, concerta 36 mg AM, trazodone 100 mg HS. Patient was administered Spravato 84 mg intranasally today.  The patient experienced the typical dissociation which gradually resolved over the 2-hour period of observation.  There were no complications.  Specifically the patient did not have nausea or vomiting or headache.  Specifically  HA is less of a problem with Spravato. No SE with meds. Intensity of Spravato dissociation varies.  No scary and well tolerated.  Was more intese today. Overall mood is markedly better and please with meds. No changes desired.  07/09/22 appt noted: In transition from Lexapro to sertraline and missing Concerta bc CO crash from it after about 4 hours.  Disc these concerns.  She'll discuss also with [name], NP More dep the last couple of days and  concerned about reducing Spravato frequency while transition from one antidep to another.  Affect less positive. Patient was administered Spravato 84 mg intranasally today.  The patient experienced the typical dissociation which gradually resolved over the 2-hour period of observation.  There were no complications.  Specifically the patient did not have nausea or vomiting or headache.  Specifically HA is less of a problem with Spravato. No SE with meds. Intensity of Spravato dissociation varies.  No scary and well tolerated.   07/14/22 appt noted: Has been more depressed this week.  More trouble with sleep.  Taking clonazepam 1-1.5  of the 0.5 mg tablets.   Trazodone not helping much. She switched back from 200 sertraline to Lexapro 20.  No SE More trouble with motivation.  Some stress with son with special needs. Misses the benevit of Concerta but it was too short acting and crashed in 4-6 hours. Patient was administered Spravato 84 mg intranasally today.  The patient experienced the typical dissociation which gradually resolved over the 2-hour period of observation.  There were no complications.  Specifically the patient did not have nausea or vomiting or headache.  Specifically HA is less of a problem with Spravato. No SE with meds. Intensity of Spravato dissociation varies.  No scary and well tolerated.   07/17/22 appt noted: Patient was administered Spravato 84 mg intranasally today.  The patient experienced the typical dissociation which gradually resolved over the 2-hour period of observation.  There were no complications.  Specifically the patient did not have nausea or vomiting or headache.  Specifically HA is less of a problem with Spravato. No SE with meds. Intensity of Spravato dissociation varies.  Not scary and well tolerated.  Just got Jornay last night but wasn't sure how to take it.  Wants to start and this was discussed.  She felt MPH helped mood and attn but didn't last long enough  and had crash after a few hours on Concerta.   Past Psychiatric Medication Trials: Trintellix - Initially effective and then was not effective when re-started Paxil- Ineffective. Helped initially. Prozac-Insomnia Lexapro- Effective and then no longer as effective Zoloft- Initially effective and then not as effective.  Viibryd Cymbalta- Ineffective. Helped initially. Effexor XR- Effective, well tolerated. Pristiq- Increased anxiety at 150 mg daily Wellbutrin XL-Ineffective.May have increased anxiety. Amitriptyline Nortriptyline-Caused irritability, constipation Auvelity- ineffective Abilify- Helpful but caused severe insomnia. Rexulti-Helpful but caused insomnia. Vraylar Seroquel - Ineffective Risperdal Olanzapine- Ineffective Latuda- Ineffective Caplyta Klonopin- Effective Temazepam- Took during menopause Lunesta- Ineffective Ambien-Ineffective Sonata- Ineffective Dayvigo- partially effective Trazodone- Ineffective Remeron-Ineffective. Does not recall wt gain.  Adderall- Took once and had adverse effect. Concerta- Effective but only 4-6 hours Metadate-Not as effective as Concerta Dexmethylphenidate- Not as effective as Concerta Azstarys- some side effects. Not as effective.  Lamictal- May have had an adverse effects. "Felt weird." Reports worsening s/s.  Lithium Gabapentin Pramipexole- "felt really weird."   ECT #20 with minimal response. H says it was partly helpful.  AIMS    Flowsheet Row Video  Visit from 01/03/2022 in [place] Total Score 0      ECT-MADRS    Flowsheet Row Video Visit from 04/29/2022 in [place] Video Visit from 02/06/2022 in [facility]  MADRS Total Score 44 23      PHQ2-9    Flowsheet Row Video Visit from 02/06/2022 in [place] Office Visit from 11/16/2020 in [facility] for [facility] Visit from 04/19/2018 in [place] at [facility] Total Score 6 0 0  PHQ-9 Total Score 14 -- 2        Review of Systems:  Review of Systems  Constitutional:  Positive for fatigue.  Cardiovascular:  Negative for chest pain and palpitations.  Psychiatric/Behavioral:  Positive for dysphoric mood. Negative for agitation and decreased concentration. The patient is not nervous/anxious.     Medications: I have reviewed the patient's current medications.  Current Outpatient Medications  Medication Sig Dispense Refill   clonazePAM (KLONOPIN) 0.5 MG tablet 2 tablets at night 60 tablet 1   doxycycline (VIBRAMYCIN) 50 MG capsule Take 50 mg by mouth daily.     escitalopram (LEXAPRO) 20 MG tablet Take 1/2 tablet daily for 7 days, then stop (Patient taking differently: Take 20 mg by mouth daily. Take 1/2 tablet daily for 7 days, then stop) 30 tablet 2   Esketamine HCl, 84 MG Dose, (SPRAVATO, 84 MG DOSE,) 28 MG/DEVICE SOPK USE 3 SPRAYS IN EACH NOSTRIL 3 DAYS 3 each 2   estradiol (ESTRACE) 0.1 MG/GM vaginal cream USE 1 GM VAGINALLY TWICE A WEEK AT BEDTIME 42.5 g 0   gabapentin (NEURONTIN) 800 MG tablet Take 1 tablet (800 mg total) by mouth at bedtime. 90 tablet 1   Lemborexant (DAYVIGO) 10 MG TABS Take 1/2-1 tab po QHS 30 tablet 2   MAGNESIUM PO Take by mouth.     Multiple Vitamin (MULTIVITAMIN) tablet Take 1 tablet by mouth daily.     traZODone (DESYREL) 100 MG tablet Take 1 tablet (100 mg total) by mouth at bedtime. Take 1-2 tabs po QHS prn 180 tablet 1   valACYclovir (VALTREX) 500 MG tablet Take one twice daily for 3 days with any symptoms 30 tablet 3   gabapentin (NEURONTIN) 100 MG capsule Take 1 capsule (100 mg total) by mouth 3 (three) times daily as needed. Take 1 capsule three times daily 90 capsule 1   Methylphenidate HCl ER, PM, (JORNAY PM) 20 MG CP24 Take 1 capsule (20 mg total) by mouth every evening. (Patient not taking: Reported on 07/17/2022) 30  capsule 0   sertraline (ZOLOFT) 100 MG tablet Take 1/2 tablet daily for 5 days, then 1 tablet daily for 5 days, then 1.5 tablets daily (Patient not taking: Reported on 07/17/2022) 45 tablet 1   No current facility-administered medications for this visit.    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   TRANSAMINASES, SERUM, ELEVATED 08/14/2009    Past  Medical History, Surgical history, Social history, and Family history were reviewed and updated as appropriate.   Please see review of systems for further details on the patient's review from today.   Objective:   Physical Exam:  LMP 12/27/2007 (Exact Date)   Physical Exam Constitutional:      General: She is not in acute distress. Musculoskeletal:        General: No deformity.  Neurological:     Mental Status: She is alert and oriented to person, place, and time.     Coordination: Coordination normal.  Psychiatric:        Attention and Perception: Attention and perception normal. She does not perceive auditory or visual hallucinations.        Mood and Affect: Mood is anxious and depressed. Affect is blunt. Affect is not labile or angry.        Speech: Speech normal.        Behavior: Behavior normal. Behavior is not slowed.        Thought Content: Thought content normal. Thought content is not delusional. Thought content does not include homicidal or suicidal ideation. Thought content does not include suicidal plan.        Cognition and Memory: Cognition and memory normal.        Judgment: Judgment normal.     Comments: Insight intact More down the last  week.     Lab Review:     Component Value Date/Time   NA 138 04/22/2022 0932   K 4.1 04/22/2022 0932   CL 98 04/22/2022 0932   CO2 31 04/22/2022 0932   GLUCOSE 102 (H) 04/22/2022 0932   BUN 10 04/22/2022 0932   CREATININE 0.71 04/22/2022 0932   CREATININE 0.68 01/26/2020 0920   CALCIUM 10.5 04/22/2022 0932   PROT 8.3 04/22/2022 0932   ALBUMIN 5.3 (H) 04/22/2022 0932   AST 82 (H) 04/22/2022 0932   ALT 118 (H) 04/22/2022 0932   ALKPHOS 74 04/22/2022 0932   BILITOT 0.3 04/22/2022 0932   GFRNONAA >90 05/01/2014 1645   GFRAA >90 05/01/2014 1645       Component Value Date/Time   WBC 3.3 (L) 04/22/2022 0932   RBC 4.25 04/22/2022 0932   HGB 14.3 04/22/2022 0932   HGB 14.1 10/26/2014 1034   HCT 40.7 04/22/2022 0932   PLT 245.0 04/22/2022 0932   MCV 95.7 04/22/2022 0932   MCH 34.4 (H) 01/26/2020 0920   MCHC 35.1 04/22/2022 0932   RDW 13.2 04/22/2022 0932   LYMPHSABS 0.9 04/22/2022 0932   MONOABS 0.2 04/22/2022 0932   EOSABS 0.1 04/22/2022 0932   BASOSABS 0.0 04/22/2022 0932    No results found for: "POCLITH", "LITHIUM"   No results found for: "PHENYTOIN", "PHENOBARB", "VALPROATE", "CBMZ"   .res Assessment: Plan:    Kemily was seen today for follow-up, depression and add.  Diagnoses and all orders for this visit:  Recurrent major depression resistant to treatment ([address])  Generalized anxiety disorder  Attention deficit hyperactivity disorder (ADHD), predominantly inattentive type  Insomnia, unspecified type    Pt seen for TRD and administration of Spravato.  Patient was administered Spravato 84 mg intranasally today.  The patient experienced the typical dissociation which gradually resolved over the 2-hour period of observation.  There were no complications.  Specifically the patient did not have nausea or vomiting. But mild headache.  Blood pressures remained within normal ranges at the 40-minute and 2-hour follow-up intervals.  By the time the 2-hour observation  period was met  the patient was alert and oriented and able to exit without assistance.  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  See nursing note for further details.Tolerated the Spravato to 84mg  .  Regarding med options.  She has obviously been thoroughly tried on numerous medications.  The only antidepressant category that she has not taken is MAO inhibitors which are not compatible with Spravato.  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 Disc this with her previously.    We discussed the short-term risks associated with benzodiazepines including sedation and increased fall risk among others.  Discussed long-term side effect risk including dependence, potential withdrawal symptoms, and the potential eventual dose-related risk of dementia.  But recent studies from 2020 dispute this association between benzodiazepines and dementia risk. Newer studies in 2020 do not support an association with dementia.  Less intense depression with Spravato but starting to relapse ? Re: stopping Concerta bc of the crash.   Wants to continue the tx plan  Tolerating meds.  Satisfied she is making progress Continue transition to sertraline from Lexapro.  She switched back to Lexapro with inadequate trial of sertraline.  Unclear which is best but will focus on sleep and getting stimulant doses solved.  Easiest trial for insomnia, one further increase clonazepam 1 mg HS. trazodone doesn't work.  Consider other long acting MPH like Cotempla, Jornay PM, Daytrana bc she gets mood benefit with MPH and concerta is too short acting.   Trial Jornapy PM for longer acting smooth distribution of MPH which helped ADD and mood.  She will start tonight.  FU twice weekly Spravato until improvement maxes  out then drop back to weekly.  She wants to   Lynder Parents, MD, DFAPA    Please see After Visit Summary for patient specific instructions.  Future Appointments  Date Time Provider [place]  07/29/2022  9:00 AM [name], PMHNP [facility] None  11/14/2022  9:55 AM Megan Salon, MD [facility]    No orders of the defined types were placed in this encounter.   ------------------------------- T

## 2022-07-20 NOTE — Progress Notes (Signed)
NURSES NOTE:     Patient arrived for her 18th Spravato treatment. Pt is being treated for Treatment Resistant Depression, the starting dose is 56 mg (2 of the 28 mg) nasal sprays. Pt will be receiving 84 mg, this is her maintenance dose. Patient taken to treatment room, I explained and discussed her treatment and how the schedule should go, along with any side effects that may occur. Answered any questions and concerns the patient had. Pt's Spravato is delivered through [place] and she is now billing with her insurance. Spravato medication is stored at doctors office per REMS/FDA guidelines. The medication is required to be locked behind two doors per FDA/REMS Protocol. Medication is also disposed of properly per regulations.      Vital Signs assessed at 8:10 AM 109/71, pulse 58, pulse ox 92%. Instructed pt to blow her nose and recline back slightly to prevent any of medication dripping out of her nose.   Pt. Given 1st dose (28 mg inhaler), then 5 minutes between 2nd dose and 3rd dose. No complaints of nausea/vomiting reported. Pt did listen to music today, she just reclined back and closed her eyes. After 40 minutes I went to assess her vital signs, no side effects or symptoms reported, B/P 131/74, pulse 56. Informed pt could rest in the chair Dr. Clovis Pu came in to discuss her treatment at the end. She will continue twice a week Spravato treatments. Nurse was with pt a total of 60 minutes but pt observed for 120 minutes per protocol. Discharge vital signs were at 10:10 AM, B/P 123/83, pulse 61. She reports dissociation she was clear upon her discharge, she left with her husband. She scheduled her next apt  March 25th. Advised to contact me with any concerns or issues prior to her apt next week.    LOT # [id number] EXP JAN 2027

## 2022-07-20 NOTE — Progress Notes (Signed)
NURSES NOTE:     Patient arrived for her 17th Spravato treatment. Pt is being treated for Treatment Resistant Depression, the starting dose is 56 mg (2 of the 28 mg) nasal sprays. Pt will be receiving 84 mg, this is her maintenance dose. Patient taken to treatment room, I explained and discussed her treatment and how the schedule should go, along with any side effects that may occur. Answered any questions and concerns the patient had. Pt's Spravato is delivered through [place] and she is now billing with her insurance. Spravato medication is stored at doctors office per REMS/FDA guidelines. The medication is required to be locked behind two doors per FDA/REMS Protocol. Medication is also disposed of properly per regulations.      Vital Signs assessed at 2:05 PM 130/77, pulse 63, pulse ox 92%. Instructed pt to blow her nose and recline back slightly to prevent any of medication dripping out of her nose.   Pt. Given 1st dose (28 mg inhaler), then 5 minutes between 2nd dose and 3rd dose. No complaints of nausea/vomiting reported. Pt did listen to music today, she just reclined back and closed her eyes. After 40 minutes I went to assess her vital signs, no side effects or symptoms reported, B/P 120/73, pulse 58. Informed pt could rest in the chair Dr. Clovis Pu came in to discuss her treatment at the end. She will continue twice a week Spravato treatments. Nurse was with pt a total of 60 minutes but pt observed for 120 minutes per protocol. Discharge vital signs were at 4:05 PM, B/P 100/58, pulse 65. She reports dissociation she was clear upon her discharge, she left with her husband. She scheduled her next apt  March 21st. Advised to contact me with any concerns or issues prior to her apt next week.    LOT # [id number] EXP JAN 2027

## 2022-07-21 ENCOUNTER — Encounter: Payer: 59 | Admitting: Psychiatry

## 2022-07-21 ENCOUNTER — Telehealth: Payer: Self-pay

## 2022-07-21 NOTE — Telephone Encounter (Signed)
Spoke with pt this morning and she reports starting the Jornay 20 mg but it felt she didn't take anything asking if she can increase dose?

## 2022-07-21 NOTE — Telephone Encounter (Signed)
OK to double the dose to 40 mg PM [place]

## 2022-07-22 ENCOUNTER — Encounter: Payer: Self-pay | Admitting: Psychiatry

## 2022-07-22 ENCOUNTER — Ambulatory Visit: Payer: 59

## 2022-07-22 ENCOUNTER — Ambulatory Visit (INDEPENDENT_AMBULATORY_CARE_PROVIDER_SITE_OTHER): Payer: 59 | Admitting: Psychiatry

## 2022-07-22 VITALS — BP 108/67 | HR 64

## 2022-07-22 DIAGNOSIS — G4700 Insomnia, unspecified: Secondary | ICD-10-CM | POA: Diagnosis not present

## 2022-07-22 DIAGNOSIS — F339 Major depressive disorder, recurrent, unspecified: Secondary | ICD-10-CM | POA: Diagnosis not present

## 2022-07-22 DIAGNOSIS — F411 Generalized anxiety disorder: Secondary | ICD-10-CM | POA: Diagnosis not present

## 2022-07-22 DIAGNOSIS — F900 Attention-deficit hyperactivity disorder, predominantly inattentive type: Secondary | ICD-10-CM

## 2022-07-22 NOTE — Telephone Encounter (Signed)
Informed pt via My Chart message and pt will also be in this afternoon for her Spravato treatment.

## 2022-07-22 NOTE — Progress Notes (Signed)
Malini Lehrmann [id number] 1960/10/28 62 y.o.  Subjective:   Patient ID:  Heather Davila is a 62 y.o. (DOB Jun 17, 1960) female.  Chief Complaint:  Chief Complaint  Patient presents with  . Follow-up  . Depression  . Fatigue  . ADD    HPI Heather Davila presents to the office today for follow-up of treatment resistant recurrent depression, generalized anxiety disorder and insomnia.  Almost too numerous to count med failures. She is here for Spravato administration for her treatment resistant depression.  05/21/22 appt noted: Patient was administered first dose Spravato 56 mg intranasally today.  The patient experienced the typical dissociation which gradually resolved over the 2-hour period of observation.  There were no complications.  Specifically the patient did not have nausea or vomiting or headache.   Dissociation was mild. Did not experience any emotional relief from disabling depression.wants to increase Spravato to the usual dose.  She is aware insurance is not covering the costs at this time.  No SI acutely. Tolerating meds.    05/23/22 appt noted: Patient was administered Spravato 84 mg intranasally today.  The patient experienced the typical dissociation which gradually resolved over the 2-hour period of observation.  There were no complications.  Specifically the patient did not have nausea or vomiting.   Dissociation was greater than last dose and a little bothersome DT some HA. Tolerating meds.   Mood has not changed significantly thus far with Spravato.  05/27/22 appt noted: Patient was administered Spravato 84 mg intranasally today.  The patient experienced the typical dissociation which gradually resolved over the 2-hour period of observation.  There were no complications.  Specifically the patient did not have nausea or vomiting or headache. Today she has felt a little relief from the pressing heaviness of depression but a little more anxiety.  During administration of  Spravato she listens to [place] and was able to relax a little more with the feeling of dissociation that was mildly bothersome. Husband was also in on the session today and asked some questions about IV ketamine versus nasal spray ketamine.  05/29/22 appt noted: Cancelled today DT hypertension  05/30/22 appt noted: Patient was administered Spravato 84 mg intranasally today.  The patient experienced the typical dissociation which gradually resolved over the 2-hour period of observation.  There were no complications.  Specifically the patient did not have nausea or vomiting or headache. She is seeing some improvement in mood with less continuous depression and less intensity.  Hopefulness is better.   No med complaints or SE  06/05/22 appt noted: Patient was administered Spravato 84 mg intranasally today.  The patient experienced the typical dissociation which gradually resolved over the 2-hour period of observation.  There were no complications.  Specifically the patient did not have nausea or vomiting or headache.  Specifically HA is less of a problem with Spravato. No SE with meds. Depression is improving with less intensity.  Intermittent anxiety easily.  More able to enjoy things.  No new concerns.  06/17/22 appt noted: Patient was administered Spravato 84 mg intranasally today.  The patient experienced the typical dissociation which gradually resolved over the 2-hour period of observation.  There were no complications.  Specifically the patient did not have nausea or vomiting or headache.  Specifically HA is less of a problem with Spravato. No SE with meds. Depression is improved 45% with less intensity.  Intermittent anxiety less easily.  More able to enjoy things.  No new concerns.  More active. Current meds: increased  clonazepam to about 1.5 mg HS DT recent insomnia, lexapro 20, Dayvigo 10 mg HS, concerta 36 mg AM, trazodone 100 mg HS.  06/20/22 appt noted: Current meds: increased  clonazepam to about 1.5 mg HS DT recent insomnia, lexapro 20, Dayvigo 10 mg HS, concerta 36 mg AM, trazodone 100 mg HS. Patient was administered Spravato 84 mg intranasally today.  The patient experienced the typical dissociation which gradually resolved over the 2-hour period of observation.  There were no complications.  Specifically the patient did not have nausea or vomiting or headache.  Specifically HA is less of a problem with Spravato. No SE with meds. Depression is improved 45% with less intensity.  Intermittent anxiety less easily.  More able to enjoy things.  No new concerns.  More active.  06/23/22 appt noted:  Current meds: increased clonazepam to about 1.5 mg HS DT recent insomnia, lexapro 20, Dayvigo 10 mg HS, concerta 36 mg AM, trazodone 100 mg HS. Patient was administered Spravato 84 mg intranasally today.  The patient experienced the typical dissociation which gradually resolved over the 2-hour period of observation.  There were no complications.  Specifically the patient did not have nausea or vomiting or headache.  Specifically HA is less of a problem with Spravato. No SE with meds. Intensity of Spravato dissociation varies.  Last week very intesne and this week more typical.  Depression is continuing to improve with better interest and activity and motivation.  Gradual improvement.  Wants to continue.  06/25/22 appt noted: Current meds: increased clonazepam to about 1.5 mg HS DT recent insomnia, lexapro 20, Dayvigo 10 mg HS, concerta 36 mg AM, trazodone 100 mg HS. Patient was administered Spravato 84 mg intranasally today.  The patient experienced the typical dissociation which gradually resolved over the 2-hour period of observation.  There were no complications.  Specifically the patient did not have nausea or vomiting or headache.  Specifically HA is less of a problem with Spravato. No SE with meds. Intensity of Spravato dissociation varies.  No scary and well  tolerated. Continues to gradually improve with Spravato.  She is more motivated and enjoying things more.  H sees progress.  Wants to continue twice weekly until gets maximum response.  Dep is not gone yet.  06/30/22 appt noted:  seen with H Current meds: increased clonazepam to about 1.5 mg HS DT recent insomnia, lexapro 20, Dayvigo 10 mg HS, concerta 36 mg AM, trazodone 100 mg HS. Patient was administered Spravato 84 mg intranasally today.  The patient experienced the typical dissociation which gradually resolved over the 2-hour period of observation.  There were no complications.  Specifically the patient did not have nausea or vomiting or headache.  Specifically HA is less of a problem with Spravato. No SE with meds. Intensity of Spravato dissociation varies.  No scary and well tolerated. Continues to gradually improve with Spravato.  She is more motivated and enjoying things more.  H sees even more progress than she does; more active and smiling.  Wants to continue twice weekly until gets maximum response.  Dep is 80% better.  07/07/22 appt noted: Current meds: increased clonazepam to about 1.5 mg HS DT recent insomnia, lexapro 20, Dayvigo 10 mg HS, concerta 36 mg AM, trazodone 100 mg HS. Patient was administered Spravato 84 mg intranasally today.  The patient experienced the typical dissociation which gradually resolved over the 2-hour period of observation.  There were no complications.  Specifically the patient did not have nausea or vomiting or  headache.  Specifically HA is less of a problem with Spravato. No SE with meds. Intensity of Spravato dissociation varies.  No scary and well tolerated.  Was more intese today. Overall mood is markedly better and please with meds. No changes desired.  07/09/22 appt noted: In transition from Lexapro to sertraline and missing Concerta bc CO crash from it after about 4 hours.  Disc these concerns.  She'll discuss also with [name], NP More dep the  last couple of days and concerned about reducing Spravato frequency while transition from one antidep to another.  Affect less positive. Patient was administered Spravato 84 mg intranasally today.  The patient experienced the typical dissociation which gradually resolved over the 2-hour period of observation.  There were no complications.  Specifically the patient did not have nausea or vomiting or headache.  Specifically HA is less of a problem with Spravato. No SE with meds. Intensity of Spravato dissociation varies.  No scary and well tolerated.   07/14/22 appt noted: Has been more depressed this week.  More trouble with sleep.  Taking clonazepam 1-1.5  of the 0.5 mg tablets.   Trazodone not helping much. She switched back from 200 sertraline to Lexapro 20.  No SE More trouble with motivation.  Some stress with son with special needs. Misses the benevit of Concerta but it was too short acting and crashed in 4-6 hours. Patient was administered Spravato 84 mg intranasally today.  The patient experienced the typical dissociation which gradually resolved over the 2-hour period of observation.  There were no complications.  Specifically the patient did not have nausea or vomiting or headache.  Specifically HA is less of a problem with Spravato. No SE with meds. Intensity of Spravato dissociation varies.  No scary and well tolerated.   07/17/22 appt noted: Patient was administered Spravato 84 mg intranasally today.  The patient experienced the typical dissociation which gradually resolved over the 2-hour period of observation.  There were no complications.  Specifically the patient did not have nausea or vomiting or headache.  Specifically HA is less of a problem with Spravato. No SE with meds. Intensity of Spravato dissociation varies.  Not scary and well tolerated.  Just got Jornay last night but wasn't sure how to take it.  Wants to start and this was discussed.  She felt MPH helped mood and attn but  didn't last long enough and had crash after a few hours on Concerta.   07/22/22 appt noted: Patient was administered Spravato 84 mg intranasally today.  The patient experienced the typical dissociation which gradually resolved over the 2-hour period of observation.  There were no complications.  Specifically the patient did not have nausea or vomiting or headache.  Specifically HA is less of a problem with Spravato. No SE with meds. Intensity of Spravato dissociation varies.  Not scary and well tolerated.  Start Jornay 20 mg over the weekend and did not notice any particular effect good or bad.  Increased to 40 mg. Other psych meds: Clonazepam 0.5 mg tablets 2 nightly, gabapentin dosage varies, Dayvigo 10 mg nightly, Lexapro 20 mg daily, to trazodone 100 mg nightly    Past Psychiatric Medication Trials: Trintellix - Initially effective and then was not effective when re-started Paxil- Ineffective. Helped initially. Prozac-Insomnia Lexapro- Effective and then no longer as effective Zoloft- Initially effective and then not as effective.  Viibryd Cymbalta- Ineffective. Helped initially. Effexor XR- Effective, well tolerated. Pristiq- Increased anxiety at 150 mg daily Wellbutrin XL-Ineffective.May have  increased anxiety. Amitriptyline Nortriptyline-Caused irritability, constipation Auvelity- ineffective Abilify- Helpful but caused severe insomnia. Rexulti-Helpful but caused insomnia. Vraylar Seroquel - Ineffective Risperdal Olanzapine- Ineffective Latuda- Ineffective Caplyta Klonopin- Effective Temazepam- Took during menopause Lunesta- Ineffective Ambien-Ineffective Sonata- Ineffective Dayvigo- partially effective Trazodone- Ineffective Remeron-Ineffective. Does not recall wt gain.  Adderall- Took once and had adverse effect. Concerta- Effective but only 4-6 hours Metadate-Not as effective as Concerta Dexmethylphenidate- Not as effective as Concerta Azstarys- some side  effects. Not as effective.  Lamictal- May have had an adverse effects. "Felt weird." Reports worsening s/s.  Lithium Gabapentin Pramipexole- "felt really weird."   ECT #20 with minimal response. H says it was partly helpful.  AIMS    Flowsheet Row Video Visit from 01/03/2022 in [place] Total Score 0      ECT-MADRS    Flowsheet Row Video Visit from 04/29/2022 in [facility] Video Visit from 02/06/2022 in [facility]  MADRS Total Score 44 23      PHQ2-9    Flowsheet Row Video Visit from 02/06/2022 in [place] Office Visit from 11/16/2020 in [facility] for [facility] [place] Visit from 04/19/2018 in [place] at [facility] Total Score 6 0 0  PHQ-9 Total Score 14 -- 2        Review of Systems:  Review of Systems  Constitutional:  Positive for fatigue.  Cardiovascular:  Negative for chest pain and palpitations.  Psychiatric/Behavioral:  Positive for dysphoric mood. Negative for agitation and decreased concentration. The patient is not nervous/anxious.     Medications: I have reviewed the patient's current medications.  Current Outpatient Medications  Medication Sig Dispense Refill  . clonazePAM (KLONOPIN) 0.5 MG tablet 2 tablets at night 60 tablet 1  . doxycycline (VIBRAMYCIN) 50 MG capsule Take 50 mg by mouth daily.    Marland Kitchen escitalopram (LEXAPRO) 20 MG tablet Take 1/2 tablet daily for 7 days, then stop (Patient taking differently: Take 20 mg by mouth daily. Take 1/2 tablet daily for 7 days, then stop) 30 tablet 2  . Esketamine HCl, 84 MG Dose, (SPRAVATO, 84 MG DOSE,) 28 MG/DEVICE SOPK USE 3 SPRAYS IN EACH NOSTRIL 3 DAYS 3 each 2  . estradiol (ESTRACE) 0.1 MG/GM vaginal cream USE 1 GM VAGINALLY TWICE A WEEK AT BEDTIME 42.5 g 0  . gabapentin (NEURONTIN) 800 MG tablet Take 1 tablet (800 mg  total) by mouth at bedtime. 90 tablet 1  . Lemborexant (DAYVIGO) 10 MG TABS Take 1/2-1 tab po QHS 30 tablet 2  . MAGNESIUM PO Take by mouth.    . Methylphenidate HCl ER, PM, (JORNAY PM) 20 MG CP24 Take 1 capsule (20 mg total) by mouth every evening. (Patient taking differently: Take 2 capsules by mouth every evening.) 30 capsule 0  . Multiple Vitamin (MULTIVITAMIN) tablet Take 1 tablet by mouth daily.    . traZODone (DESYREL) 100 MG tablet Take 1 tablet (100 mg total) by mouth at bedtime. Take 1-2 tabs po QHS prn 180 tablet 1  . valACYclovir (VALTREX) 500 MG tablet Take one twice daily for 3 days with any symptoms 30 tablet 3  . gabapentin (NEURONTIN) 100 MG capsule Take 1 capsule (100 mg total) by mouth 3 (three) times daily as needed. Take 1 capsule three times daily 90 capsule 1  . sertraline (ZOLOFT) 100 MG tablet Take 1/2 tablet daily for 5 days, then 1 tablet  daily for 5 days, then 1.5 tablets daily (Patient not taking: Reported on 07/22/2022) 45 tablet 1   No current facility-administered medications for this visit.    Medication Side Effects: None  Allergies:  Allergies  Allergen Reactions  . Ciprofloxacin     REACTION: hives  . Oxycodone-Acetaminophen     REACTION: hives    Past Medical History:  Diagnosis Date  . Adult acne   . Anemia   . Anorexia nervosa teen  . DEPRESSION 08/09/2009  . Hematoma 09/2020   post op after face lift  . Insomnia   . 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  . TRANSAMINASES, SERUM, ELEVATED 08/14/2009    Past Medical History, Surgical history, Social history, and Family history were reviewed and updated as appropriate.   Please see review of systems for further details on the patient's review from today.   Objective:   Physical Exam:  LMP 12/27/2007 (Exact Date)   Physical Exam Constitutional:      General: She is not in acute distress. Musculoskeletal:        General: No deformity.   Neurological:     Mental Status: She is alert and oriented to person, place, and time.     Coordination: Coordination normal.  Psychiatric:        Attention and Perception: Attention and perception normal. She does not perceive auditory or visual hallucinations.        Mood and Affect: Mood is anxious and depressed. Affect is blunt. Affect is not labile or angry.        Speech: Speech normal.        Behavior: Behavior normal. Behavior is not slowed.        Thought Content: Thought content normal. Thought content is not delusional. Thought content does not include homicidal or suicidal ideation. Thought content does not include suicidal plan.        Cognition and Memory: Cognition and memory normal.        Judgment: Judgment normal.     Comments: Insight intact More down the last week.    Lab Review:     Component Value Date/Time   NA 138 04/22/2022 0932   K 4.1 04/22/2022 0932   CL 98 04/22/2022 0932   CO2 31 04/22/2022 0932   GLUCOSE 102 (H) 04/22/2022 0932   BUN 10 04/22/2022 0932   CREATININE 0.71 04/22/2022 0932   CREATININE 0.68 01/26/2020 0920   CALCIUM 10.5 04/22/2022 0932   PROT 8.3 04/22/2022 0932   ALBUMIN 5.3 (H) 04/22/2022 0932   AST 82 (H) 04/22/2022 0932   ALT 118 (H) 04/22/2022 0932   ALKPHOS 74 04/22/2022 0932   BILITOT 0.3 04/22/2022 0932   GFRNONAA >90 05/01/2014 1645   GFRAA >90 05/01/2014 1645       Component Value Date/Time   WBC 3.3 (L) 04/22/2022 0932   RBC 4.25 04/22/2022 0932   HGB 14.3 04/22/2022 0932   HGB 14.1 10/26/2014 1034   HCT 40.7 04/22/2022 0932   PLT 245.0 04/22/2022 0932   MCV 95.7 04/22/2022 0932   MCH 34.4 (H) 01/26/2020 0920   MCHC 35.1 04/22/2022 0932   RDW 13.2 04/22/2022 0932   LYMPHSABS 0.9 04/22/2022 0932   MONOABS 0.2 04/22/2022 0932   EOSABS 0.1 04/22/2022 0932   BASOSABS 0.0 04/22/2022 0932    No results found for: "POCLITH", "LITHIUM"   No results found for: "PHENYTOIN", "PHENOBARB", "VALPROATE", "CBMZ"    .  res Assessment: Plan:    Aleezah was seen today for follow-up, depression, fatigue and add.  Diagnoses and all orders for this visit:  Recurrent major depression resistant to treatment [facility])  Attention deficit hyperactivity disorder (ADHD), predominantly inattentive type  Generalized anxiety disorder  Insomnia, unspecified type    Pt seen for TRD and administration of Spravato.  Patient was administered Spravato 84 mg intranasally today.  The patient experienced the typical dissociation which gradually resolved over the 2-hour period of observation.  There were no complications.  Specifically the patient did not have nausea or vomiting. But mild headache.  Blood pressures remained within normal ranges at the 40-minute and 2-hour follow-up intervals.  By the time the 2-hour observation period was met the patient was alert and oriented and able to exit without assistance.  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  See nursing note for further details.Tolerated the Spravato to 84mg  .  Regarding med options.  She has obviously been thoroughly tried on numerous medications.  The only antidepressant category that she has not taken is MAO inhibitors which are not compatible with Spravato.  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 Disc this with her previously.    We discussed the short-term risks associated with benzodiazepines including sedation and increased fall risk among others.  Discussed long-term side effect risk including dependence, potential withdrawal symptoms, and the potential eventual dose-related risk of dementia.  But recent studies from 2020 dispute this association between benzodiazepines and dementia risk. Newer studies in 2020 do not support an  association with dementia.  Less intense depression with Spravato but starting to relapse ? Re: stopping Concerta bc of the crash.   Wants to continue the tx plan  Tolerating meds.  Satisfied she is making progress Continue transition to sertraline from Lexapro.  She switched back to Lexapro with inadequate trial of sertraline.  Unclear which is best but will focus on sleep and getting stimulant doses solved.  Easiest trial for insomnia, one further increase clonazepam 1 mg HS. trazodone doesn't work.  Consider other long acting MPH like Cotempla, Jornay PM, Daytrana bc she gets mood benefit with MPH and concerta is too short acting.   Trial Jornapy PM for longer acting smooth distribution of MPH which helped ADD and mood.  She will start tonight.  FU twice weekly Spravato until improvement maxes out then drop back to weekly.  She wants to   Lynder Parents, MD, DFAPA    Please see After Visit Summary for patient specific instructions.  Future Appointments  Date Time Provider [place]  07/24/2022  8:00 AM CP-NURSE [facility] None  07/24/2022  9:00 AM Cottle, Billey Co., MD CP-CP None  07/29/2022  9:00 AM [name], PMHNP CP-CP None  08/19/2022  1:55 PM Megan Salon, MD [facility]    No orders of the defined types were placed in this encounter.   ------------------------------- T

## 2022-07-23 ENCOUNTER — Other Ambulatory Visit: Payer: Self-pay | Admitting: Psychiatry

## 2022-07-23 DIAGNOSIS — F339 Major depressive disorder, recurrent, unspecified: Secondary | ICD-10-CM

## 2022-07-23 NOTE — Progress Notes (Signed)
NURSES NOTE:     Patient arrived for her 19th Spravato treatment. Pt is being treated for Treatment Resistant Depression, the starting dose is 56 mg (2 of the 28 mg) nasal sprays. Pt will be receiving 84 mg, this is her maintenance dose. Patient taken to treatment room, I explained and discussed her treatment and how the schedule should go, along with any side effects that may occur. Answered any questions and concerns the patient had. Pt's Spravato is delivered through [place] and she is now billing with her insurance. Spravato medication is stored at doctors office per REMS/FDA guidelines. The medication is required to be locked behind two doors per FDA/REMS Protocol. Medication is also disposed of properly per regulations.      Vital Signs assessed at 2:00 PM 122/70, pulse 61, pulse ox 92%. Instructed pt to blow her nose and recline back slightly to prevent any of medication dripping out of her nose.  We did discuss increasing her Jornay to 40 mg beginning tonight.  Pt. Given 1st dose (28 mg inhaler), then 5 minutes between 2nd dose and 3rd dose. No complaints of nausea/vomiting reported. Pt did listen to music today, she just reclined back and closed her eyes. After 40 minutes I went to assess her vital signs, no side effects or symptoms reported, B/P 134/74, pulse 62. Informed pt could rest in the chair Dr. Clovis Pu came in to discuss her treatment at the end. She will continue twice a week Spravato treatments. Nurse was with pt a total of 60 minutes but pt observed for 120 minutes per protocol. Discharge vital signs were at 4:00 PM, B/P 108/67, pulse 64. She reports dissociation she was clear upon her discharge, she left with her husband. She scheduled her next apt  March 28th. Advised to contact me with any concerns or issues prior to her apt next week.    LOT # [id number] EXP [id number]

## 2022-07-24 ENCOUNTER — Encounter: Payer: Self-pay | Admitting: Psychiatry

## 2022-07-24 ENCOUNTER — Ambulatory Visit: Payer: 59

## 2022-07-24 ENCOUNTER — Ambulatory Visit (INDEPENDENT_AMBULATORY_CARE_PROVIDER_SITE_OTHER): Payer: 59 | Admitting: Psychiatry

## 2022-07-24 ENCOUNTER — Telehealth: Payer: Self-pay | Admitting: Psychiatry

## 2022-07-24 VITALS — BP 129/82 | HR 60

## 2022-07-24 DIAGNOSIS — F900 Attention-deficit hyperactivity disorder, predominantly inattentive type: Secondary | ICD-10-CM

## 2022-07-24 DIAGNOSIS — F411 Generalized anxiety disorder: Secondary | ICD-10-CM

## 2022-07-24 DIAGNOSIS — F339 Major depressive disorder, recurrent, unspecified: Secondary | ICD-10-CM

## 2022-07-24 DIAGNOSIS — G4700 Insomnia, unspecified: Secondary | ICD-10-CM

## 2022-07-24 NOTE — Telephone Encounter (Signed)
[employer] ins sent [place] request form for Auvelity 45-105mg 

## 2022-07-24 NOTE — Progress Notes (Signed)
Neilani Cisneroz CE:[id number] 1960/04/30 62 y.o.  Subjective:   Patient ID:  Heather Davila is a 62 y.o. (DOB [birth year]) female.  Chief Complaint:  Chief Complaint  Patient presents with   Follow-up   Depression   ADD    HPI Heather Davila presents to the office today for follow-up of treatment resistant recurrent depression, generalized anxiety disorder and insomnia.  Almost too numerous to count med failures. She is here for Spravato administration for her treatment resistant depression.  05/21/22 appt noted: Patient was administered first dose Spravato 56 mg intranasally today.  The patient experienced the typical dissociation which gradually resolved over the 2-hour period of observation.  There were no complications.  Specifically the patient did not have nausea or vomiting or headache.   Dissociation was mild. Did not experience any emotional relief from disabling depression.wants to increase Spravato to the usual dose.  She is aware insurance is not covering the costs at this time.  No SI acutely. Tolerating meds.    05/23/22 appt noted: Patient was administered Spravato 84 mg intranasally today.  The patient experienced the typical dissociation which gradually resolved over the 2-hour period of observation.  There were no complications.  Specifically the patient did not have nausea or vomiting.   Dissociation was greater than last dose and a little bothersome DT some HA. Tolerating meds.   Mood has not changed significantly thus far with Spravato.  05/27/22 appt noted: Patient was administered Spravato 84 mg intranasally today.  The patient experienced the typical dissociation which gradually resolved over the 2-hour period of observation.  There were no complications.  Specifically the patient did not have nausea or vomiting or headache. Today she has felt a little relief from the pressing heaviness of depression but a little more anxiety.  During administration of Spravato she  listens to [place] and was able to relax a little more with the feeling of dissociation that was mildly bothersome. Husband was also in on the session today and asked some questions about IV ketamine versus nasal spray ketamine.  05/29/22 appt noted: Cancelled today DT hypertension  05/30/22 appt noted: Patient was administered Spravato 84 mg intranasally today.  The patient experienced the typical dissociation which gradually resolved over the 2-hour period of observation.  There were no complications.  Specifically the patient did not have nausea or vomiting or headache. She is seeing some improvement in mood with less continuous depression and less intensity.  Hopefulness is better.   No med complaints or SE  06/05/22 appt noted: Patient was administered Spravato 84 mg intranasally today.  The patient experienced the typical dissociation which gradually resolved over the 2-hour period of observation.  There were no complications.  Specifically the patient did not have nausea or vomiting or headache.  Specifically HA is less of a problem with Spravato. No SE with meds. Depression is improving with less intensity.  Intermittent anxiety easily.  More able to enjoy things.  No new concerns.  06/17/22 appt noted: Patient was administered Spravato 84 mg intranasally today.  The patient experienced the typical dissociation which gradually resolved over the 2-hour period of observation.  There were no complications.  Specifically the patient did not have nausea or vomiting or headache.  Specifically HA is less of a problem with Spravato. No SE with meds. Depression is improved 45% with less intensity.  Intermittent anxiety less easily.  More able to enjoy things.  No new concerns.  More active. Current meds: increased clonazepam to about  1.5 mg HS DT recent insomnia, lexapro 20, Dayvigo 10 mg HS, concerta 36 mg AM, trazodone 100 mg HS.  06/20/22 appt noted: Current meds: increased clonazepam to  about 1.5 mg HS DT recent insomnia, lexapro 20, Dayvigo 10 mg HS, concerta 36 mg AM, trazodone 100 mg HS. Patient was administered Spravato 84 mg intranasally today.  The patient experienced the typical dissociation which gradually resolved over the 2-hour period of observation.  There were no complications.  Specifically the patient did not have nausea or vomiting or headache.  Specifically HA is less of a problem with Spravato. No SE with meds. Depression is improved 45% with less intensity.  Intermittent anxiety less easily.  More able to enjoy things.  No new concerns.  More active.  06/23/22 appt noted:  Current meds: increased clonazepam to about 1.5 mg HS DT recent insomnia, lexapro 20, Dayvigo 10 mg HS, concerta 36 mg AM, trazodone 100 mg HS. Patient was administered Spravato 84 mg intranasally today.  The patient experienced the typical dissociation which gradually resolved over the 2-hour period of observation.  There were no complications.  Specifically the patient did not have nausea or vomiting or headache.  Specifically HA is less of a problem with Spravato. No SE with meds. Intensity of Spravato dissociation varies.  Last week very intesne and this week more typical.  Depression is continuing to improve with better interest and activity and motivation.  Gradual improvement.  Wants to continue.  06/25/22 appt noted: Current meds: increased clonazepam to about 1.5 mg HS DT recent insomnia, lexapro 20, Dayvigo 10 mg HS, concerta 36 mg AM, trazodone 100 mg HS. Patient was administered Spravato 84 mg intranasally today.  The patient experienced the typical dissociation which gradually resolved over the 2-hour period of observation.  There were no complications.  Specifically the patient did not have nausea or vomiting or headache.  Specifically HA is less of a problem with Spravato. No SE with meds. Intensity of Spravato dissociation varies.  No scary and well tolerated. Continues to  gradually improve with Spravato.  She is more motivated and enjoying things more.  H sees progress.  Wants to continue twice weekly until gets maximum response.  Dep is not gone yet.  06/30/22 appt noted:  seen with H Current meds: increased clonazepam to about 1.5 mg HS DT recent insomnia, lexapro 20, Dayvigo 10 mg HS, concerta 36 mg AM, trazodone 100 mg HS. Patient was administered Spravato 84 mg intranasally today.  The patient experienced the typical dissociation which gradually resolved over the 2-hour period of observation.  There were no complications.  Specifically the patient did not have nausea or vomiting or headache.  Specifically HA is less of a problem with Spravato. No SE with meds. Intensity of Spravato dissociation varies.  No scary and well tolerated. Continues to gradually improve with Spravato.  She is more motivated and enjoying things more.  H sees even more progress than she does; more active and smiling.  Wants to continue twice weekly until gets maximum response.  Dep is 80% better.  07/07/22 appt noted: Current meds: increased clonazepam to about 1.5 mg HS DT recent insomnia, lexapro 20, Dayvigo 10 mg HS, concerta 36 mg AM, trazodone 100 mg HS. Patient was administered Spravato 84 mg intranasally today.  The patient experienced the typical dissociation which gradually resolved over the 2-hour period of observation.  There were no complications.  Specifically the patient did not have nausea or vomiting or headache.  Specifically  HA is less of a problem with Spravato. No SE with meds. Intensity of Spravato dissociation varies.  No scary and well tolerated.  Was more intese today. Overall mood is markedly better and please with meds. No changes desired.  07/09/22 appt noted: In transition from Lexapro to sertraline and missing Concerta bc CO crash from it after about 4 hours.  Disc these concerns.  She'll discuss also with [name], NP More dep the last couple of days and  concerned about reducing Spravato frequency while transition from one antidep to another.  Affect less positive. Patient was administered Spravato 84 mg intranasally today.  The patient experienced the typical dissociation which gradually resolved over the 2-hour period of observation.  There were no complications.  Specifically the patient did not have nausea or vomiting or headache.  Specifically HA is less of a problem with Spravato. No SE with meds. Intensity of Spravato dissociation varies.  No scary and well tolerated.   07/14/22 appt noted: Has been more depressed this week.  More trouble with sleep.  Taking clonazepam 1-1.5  of the 0.5 mg tablets.   Trazodone not helping much. She switched back from 200 sertraline to Lexapro 20.  No SE More trouble with motivation.  Some stress with son with special needs. Misses the benevit of Concerta but it was too short acting and crashed in 4-6 hours. Patient was administered Spravato 84 mg intranasally today.  The patient experienced the typical dissociation which gradually resolved over the 2-hour period of observation.  There were no complications.  Specifically the patient did not have nausea or vomiting or headache.  Specifically HA is less of a problem with Spravato. No SE with meds. Intensity of Spravato dissociation varies.  No scary and well tolerated.   07/17/22 appt noted: Patient was administered Spravato 84 mg intranasally today.  The patient experienced the typical dissociation which gradually resolved over the 2-hour period of observation.  There were no complications.  Specifically the patient did not have nausea or vomiting or headache.  Specifically HA is less of a problem with Spravato. No SE with meds. Intensity of Spravato dissociation varies.  Not scary and well tolerated.  Just got Jornay last night but wasn't sure how to take it.  Wants to start and this was discussed.  She felt MPH helped mood and attn but didn't last long enough  and had crash after a few hours on Concerta.   07/22/22 appt noted: Patient was administered Spravato 84 mg intranasally today.  The patient experienced the typical dissociation which gradually resolved over the 2-hour period of observation.  There were no complications.  Specifically the patient did not have nausea or vomiting or headache.  Specifically HA is less of a problem with Spravato. No SE with meds. Intensity of Spravato dissociation varies.  Not scary and well tolerated.  Start Jornay 20 mg over the weekend and did not notice any particular effect good or bad.  Increased to 40 mg. Other psych meds: Clonazepam 0.5 mg tablets 2 nightly, gabapentin dosage varies, Dayvigo 10 mg nightly, Lexapro 20 mg daily, to trazodone 100 mg nightly  07/24/22 appt noted: Patient was administered Spravato 84 mg intranasally today.  The patient experienced the typical dissociation which gradually resolved over the 2-hour period of observation.  There were no complications.  Specifically the patient did not have nausea or vomiting or headache.  Specifically HA is less of a problem with Spravato. No SE with meds. Intensity of Spravato dissociation  varies.  Not scary and well tolerated.  Other psych meds: Clonazepam 0.5 mg tablets 2 nightly, gabapentin dosage varies, Dayvigo 10 mg nightly, Lexapro 20 mg daily, to trazodone 100 mg nightly, Jornay 40 mg PM Doing well with Spravato.  Noticed a little effect from the Jornay 40 without SE but would like to increase the dose for ADD and mood.  Sleeping well.  No new concerns.  Still more depressed than she was with th initial benefit of Spravato.  Past Psychiatric Medication Trials: Trintellix - Initially effective and then was not effective when re-started Paxil- Ineffective. Helped initially. Prozac-Insomnia Lexapro- Effective and then no longer as effective Zoloft- Initially effective and then not as effective.  Viibryd Cymbalta- Ineffective. Helped  initially. Effexor XR- Effective, well tolerated. Pristiq- Increased anxiety at 150 mg daily Wellbutrin XL-Ineffective.May have increased anxiety. Amitriptyline Nortriptyline-Caused irritability, constipation Auvelity- ineffective Abilify- Helpful but caused severe insomnia. Rexulti-Helpful but caused insomnia. Vraylar Seroquel - Ineffective Risperdal Olanzapine- Ineffective Latuda- Ineffective Caplyta Klonopin- Effective Temazepam- Took during menopause Lunesta- Ineffective Ambien-Ineffective Sonata- Ineffective Dayvigo- partially effective Trazodone- Ineffective Remeron-Ineffective. Does not recall wt gain.  Adderall- Took once and had adverse effect. Concerta- Effective but only 4-6 hours Metadate-Not as effective as Concerta Dexmethylphenidate- Not as effective as Concerta Azstarys- some side effects. Not as effective.  Lamictal- May have had an adverse effects. "Felt weird." Reports worsening s/s.  Lithium Gabapentin Pramipexole- "felt really weird."   ECT #20 with minimal response. H says it was partly helpful.  AIMS    Flowsheet Row Video Visit from 01/03/2022 in [place] Total Score 0      ECT-MADRS    Flowsheet Row Video Visit from 04/29/2022 in [facility] Video Visit from 02/06/2022 in [facility]  MADRS Total Score 44 23      PHQ2-9    Flowsheet Row Video Visit from 02/06/2022 in [place] Office Visit from 11/16/2020 in [facility] [place] Visit from 04/19/2018 in [place] [facility] Total Score 6 0 0  PHQ-9 Total Score 14 -- 2        Review of Systems:  Review of Systems  Constitutional:  Positive for fatigue.  Cardiovascular:  Negative for palpitations.  Neurological:  Negative for tremors.  Psychiatric/Behavioral:  Positive for dysphoric  mood. Negative for agitation and decreased concentration. The patient is not nervous/anxious.     Medications: I have reviewed the patient's current medications.  Current Outpatient Medications  Medication Sig Dispense Refill   clonazePAM (KLONOPIN) 0.5 MG tablet 2 tablets at night 60 tablet 1   doxycycline (VIBRAMYCIN) 50 MG capsule Take 50 mg by mouth daily.     escitalopram (LEXAPRO) 20 MG tablet Take 1/2 tablet daily for 7 days, then stop (Patient taking differently: Take 20 mg by mouth daily. Take 1/2 tablet daily for 7 days, then stop) 30 tablet 2   Esketamine HCl, 84 MG Dose, (SPRAVATO, 84 MG DOSE,) 28 MG/DEVICE SOPK USE 3 SPRAYS IN EACH NOSTRIL EVERY 3 DAYS 3 each 0   estradiol (ESTRACE) 0.1 MG/GM vaginal cream USE 1 GM VAGINALLY TWICE A WEEK AT BEDTIME 42.5 g 0   gabapentin (NEURONTIN) 800 MG tablet Take 1 tablet (800 mg total) by mouth at bedtime. 90 tablet 1   Lemborexant (DAYVIGO) 10 MG TABS Take 1/2-1 tab po QHS 30 tablet 2   MAGNESIUM PO Take  by mouth.     Methylphenidate HCl ER, PM, (JORNAY PM) 20 MG CP24 Take 1 capsule (20 mg total) by mouth every evening. (Patient taking differently: Take 2 capsules by mouth every evening.) 30 capsule 0   Multiple Vitamin (MULTIVITAMIN) tablet Take 1 tablet by mouth daily.     sertraline (ZOLOFT) 100 MG tablet Take 1/2 tablet daily for 5 days, then 1 tablet daily for 5 days, then 1.5 tablets daily 45 tablet 1   traZODone (DESYREL) 100 MG tablet Take 1 tablet (100 mg total) by mouth at bedtime. Take 1-2 tabs po QHS prn 180 tablet 1   valACYclovir (VALTREX) 500 MG tablet Take one twice daily for 3 days with any symptoms 30 tablet 3   gabapentin (NEURONTIN) 100 MG capsule Take 1 capsule (100 mg total) by mouth 3 (three) times daily as needed. Take 1 capsule three times daily 90 capsule 1   No current facility-administered medications for this visit.    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   TRANSAMINASES, SERUM, ELEVATED 08/14/2009    Past Medical History, Surgical history, Social history, and Family history were reviewed and updated as appropriate.   Please see review of systems for further details on the patient's review from today.   Objective:   Physical Exam:  LMP 12/27/2007 (Exact Date)   Physical Exam Constitutional:      General: She is not in acute distress. Musculoskeletal:        General: No deformity.  Neurological:     Mental Status: She is alert and oriented to person, place, and time.     Coordination: Coordination normal.  Psychiatric:        Attention and Perception: Attention and perception normal. She does not perceive auditory or visual hallucinations.        Mood and Affect: Mood is anxious and depressed. Affect is blunt. Affect is not labile.        Speech: Speech normal.        Behavior: Behavior normal.        Thought Content: Thought content normal. Thought content is not delusional. Thought content does not include homicidal or suicidal ideation. Thought content does not include suicidal plan.        Cognition and Memory: Cognition and memory normal.        Judgment: Judgment normal.     Comments: Insight intact More down the last 2 week.     Lab Review:     Component Value Date/Time   NA 138 04/22/2022 0932   K 4.1 04/22/2022 0932   CL 98 04/22/2022 0932   CO2 31 04/22/2022 0932   GLUCOSE 102 (H) 04/22/2022 0932   BUN 10 04/22/2022 0932   CREATININE 0.71 04/22/2022 0932   CREATININE 0.68 01/26/2020 0920   CALCIUM 10.5 04/22/2022 0932   PROT 8.3 04/22/2022 0932   ALBUMIN 5.3 (H) 04/22/2022 0932    AST 82 (H) 04/22/2022 0932   ALT 118 (H) 04/22/2022 0932   ALKPHOS 74 04/22/2022  0932   BILITOT 0.3 04/22/2022 0932   GFRNONAA >90 05/01/2014 1645   GFRAA >90 05/01/2014 1645       Component Value Date/Time   WBC 3.3 (L) 04/22/2022 0932   RBC 4.25 04/22/2022 0932   HGB 14.3 04/22/2022 0932   HGB 14.1 10/26/2014 1034   HCT 40.7 04/22/2022 0932   PLT 245.0 04/22/2022 0932   MCV 95.7 04/22/2022 0932   MCH 34.4 (H) 01/26/2020 0920   MCHC 35.1 04/22/2022 0932   RDW 13.2 04/22/2022 0932   LYMPHSABS 0.9 04/22/2022 0932   MONOABS 0.2 04/22/2022 0932   EOSABS 0.1 04/22/2022 0932   BASOSABS 0.0 04/22/2022 0932    No results found for: "POCLITH", "LITHIUM"   No results found for: "PHENYTOIN", "PHENOBARB", "VALPROATE", "CBMZ"   .res Assessment: Plan:    Skylaa was seen today for follow-up, depression and add.  Diagnoses and all orders for this visit:  Recurrent major depression resistant to treatment [facility])  Attention deficit hyperactivity disorder (ADHD), predominantly inattentive type  Generalized anxiety disorder  Insomnia, unspecified type    Pt seen for TRD and administration of Spravato.  Patient was administered Spravato 84 mg intranasally today.  The patient experienced the typical dissociation which gradually resolved over the 2-hour period of observation.  There were no complications.  Specifically the patient did not have nausea or vomiting. But mild headache.  Blood pressures remained within normal ranges at the 40-minute and 2-hour follow-up intervals.  By the time the 2-hour observation period was met the patient was alert and oriented and able to exit without assistance.  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  See nursing note for further details.Tolerated the Spravato to 84mg  .  Regarding med options.  She has obviously been thoroughly tried on numerous medications.  The  only antidepressant category that she has not taken is MAO inhibitors which are not compatible with Spravato.  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 Disc this with her previously.    We discussed the short-term risks associated with benzodiazepines including sedation and increased fall risk among others.  Discussed long-term side effect risk including dependence, potential withdrawal symptoms, and the potential eventual dose-related risk of dementia.  But recent studies from 2020 dispute this association between benzodiazepines and dementia risk. Newer studies in 2020 do not support an association with dementia.  Less intense depression with Spravato but starting to relapse ? Re: stopping Concerta bc of the crash.   Wants to continue the tx plan  Tolerating meds.  Satisfied she is making progress Continue transition to sertraline from Lexapro.  She switched back to Lexapro with inadequate trial of sertraline.  Unclear which is best but will focus on sleep and getting stimulant doses solved.  Easiest trial for insomnia, clonazepam 1 mg HS. trazodone doesn't work.  Consider other long acting MPH like Cotempla, Jornay PM, Daytrana bc she gets mood benefit with MPH and concerta is too short acting.   Increase Jornay 60 PM for longer acting smooth distribution of MPH which helped ADD and mood.    FU twice weekly Spravato until improvement maxes out then drop back to weekly.  She wants to   Lynder Parents, MD, DFAPA    Please see After Visit Summary for patient specific instructions.  Future Appointments  Date Time  Provider Marvin  07/29/2022  7:00 AM [place], Betty G, MD [facility]  07/29/2022  9:00 AM [name], PMHNP [facility] None  08/19/2022  1:55 PM Megan Salon, MD [facility]    No orders of the defined types were placed in this  encounter.   ------------------------------- T

## 2022-07-25 ENCOUNTER — Other Ambulatory Visit: Payer: Self-pay | Admitting: Psychiatry

## 2022-07-25 DIAGNOSIS — F339 Major depressive disorder, recurrent, unspecified: Secondary | ICD-10-CM

## 2022-07-28 ENCOUNTER — Encounter: Payer: 59 | Admitting: Psychiatry

## 2022-07-29 ENCOUNTER — Telehealth: Payer: 59 | Admitting: Psychiatry

## 2022-07-29 ENCOUNTER — Ambulatory Visit: Payer: Managed Care, Other (non HMO) | Admitting: Family Medicine

## 2022-07-30 ENCOUNTER — Ambulatory Visit: Payer: 59

## 2022-07-30 ENCOUNTER — Encounter: Payer: Self-pay | Admitting: Psychiatry

## 2022-07-30 ENCOUNTER — Ambulatory Visit (INDEPENDENT_AMBULATORY_CARE_PROVIDER_SITE_OTHER): Payer: 59 | Admitting: Psychiatry

## 2022-07-30 ENCOUNTER — Other Ambulatory Visit: Payer: Self-pay

## 2022-07-30 VITALS — BP 131/80 | HR 67

## 2022-07-30 DIAGNOSIS — G4700 Insomnia, unspecified: Secondary | ICD-10-CM

## 2022-07-30 DIAGNOSIS — Z6379 Other stressful life events affecting family and household: Secondary | ICD-10-CM | POA: Diagnosis not present

## 2022-07-30 DIAGNOSIS — F339 Major depressive disorder, recurrent, unspecified: Secondary | ICD-10-CM

## 2022-07-30 DIAGNOSIS — F419 Anxiety disorder, unspecified: Secondary | ICD-10-CM

## 2022-07-30 DIAGNOSIS — F900 Attention-deficit hyperactivity disorder, predominantly inattentive type: Secondary | ICD-10-CM

## 2022-07-30 DIAGNOSIS — F411 Generalized anxiety disorder: Secondary | ICD-10-CM | POA: Diagnosis not present

## 2022-07-30 MED ORDER — JORNAY PM 80 MG PO CP24
1.0000 | ORAL_CAPSULE | Freq: Every evening | ORAL | 0 refills | Status: DC
Start: 2022-07-30 — End: 2022-08-04

## 2022-07-30 MED ORDER — CLONAZEPAM 0.5 MG PO TABS: ORAL_TABLET | ORAL | 1 refills | Status: DC

## 2022-07-30 NOTE — Progress Notes (Signed)
Heather Davila CE:[id number] 1960-10-27 62 y.o.  Subjective:   Patient ID:  Heather Davila is a 62 y.o. (DOB 1960/10/01) female.  Chief Complaint:  Chief Complaint  Patient presents with   Follow-up   Depression   Anxiety   Stress   Sleeping Problem   ADD    HPI Heather Davila presents to the office today for follow-up of treatment resistant recurrent depression, generalized anxiety disorder and insomnia.  Almost too numerous to count med failures. She is here for Spravato administration for her treatment resistant depression.  05/21/22 appt noted: Patient was administered first dose Spravato 56 mg intranasally today.  The patient experienced the typical dissociation which gradually resolved over the 2-hour period of observation.  There were no complications.  Specifically the patient did not have nausea or vomiting or headache.   Dissociation was mild. Did not experience any emotional relief from disabling depression.wants to increase Spravato to the usual dose.  She is aware insurance is not covering the costs at this time.  No SI acutely. Tolerating meds.    05/23/22 appt noted: Patient was administered Spravato 84 mg intranasally today.  The patient experienced the typical dissociation which gradually resolved over the 2-hour period of observation.  There were no complications.  Specifically the patient did not have nausea or vomiting.   Dissociation was greater than last dose and a little bothersome DT some HA. Tolerating meds.   Mood has not changed significantly thus far with Spravato.  05/27/22 appt noted: Patient was administered Spravato 84 mg intranasally today.  The patient experienced the typical dissociation which gradually resolved over the 2-hour period of observation.  There were no complications.  Specifically the patient did not have nausea or vomiting or headache. Today she has felt a little relief from the pressing heaviness of depression but a little more anxiety.   During administration of Spravato she listens to [place] and was able to relax a little more with the feeling of dissociation that was mildly bothersome. Husband was also in on the session today and asked some questions about IV ketamine versus nasal spray ketamine.  05/29/22 appt noted: Cancelled today DT hypertension  05/30/22 appt noted: Patient was administered Spravato 84 mg intranasally today.  The patient experienced the typical dissociation which gradually resolved over the 2-hour period of observation.  There were no complications.  Specifically the patient did not have nausea or vomiting or headache. She is seeing some improvement in mood with less continuous depression and less intensity.  Hopefulness is better.   No med complaints or SE  06/05/22 appt noted: Patient was administered Spravato 84 mg intranasally today.  The patient experienced the typical dissociation which gradually resolved over the 2-hour period of observation.  There were no complications.  Specifically the patient did not have nausea or vomiting or headache.  Specifically HA is less of a problem with Spravato. No SE with meds. Depression is improving with less intensity.  Intermittent anxiety easily.  More able to enjoy things.  No new concerns.  06/17/22 appt noted: Patient was administered Spravato 84 mg intranasally today.  The patient experienced the typical dissociation which gradually resolved over the 2-hour period of observation.  There were no complications.  Specifically the patient did not have nausea or vomiting or headache.  Specifically HA is less of a problem with Spravato. No SE with meds. Depression is improved 45% with less intensity.  Intermittent anxiety less easily.  More able to enjoy things.  No new  concerns.  More active. Current meds: increased clonazepam to about 1.5 mg HS DT recent insomnia, lexapro 20, Dayvigo 10 mg HS, concerta 36 mg AM, trazodone 100 mg HS.  06/20/22 appt  noted: Current meds: increased clonazepam to about 1.5 mg HS DT recent insomnia, lexapro 20, Dayvigo 10 mg HS, concerta 36 mg AM, trazodone 100 mg HS. Patient was administered Spravato 84 mg intranasally today.  The patient experienced the typical dissociation which gradually resolved over the 2-hour period of observation.  There were no complications.  Specifically the patient did not have nausea or vomiting or headache.  Specifically HA is less of a problem with Spravato. No SE with meds. Depression is improved 45% with less intensity.  Intermittent anxiety less easily.  More able to enjoy things.  No new concerns.  More active.  06/23/22 appt noted:  Current meds: increased clonazepam to about 1.5 mg HS DT recent insomnia, lexapro 20, Dayvigo 10 mg HS, concerta 36 mg AM, trazodone 100 mg HS. Patient was administered Spravato 84 mg intranasally today.  The patient experienced the typical dissociation which gradually resolved over the 2-hour period of observation.  There were no complications.  Specifically the patient did not have nausea or vomiting or headache.  Specifically HA is less of a problem with Spravato. No SE with meds. Intensity of Spravato dissociation varies.  Last week very intesne and this week more typical.  Depression is continuing to improve with better interest and activity and motivation.  Gradual improvement.  Wants to continue.  06/25/22 appt noted: Current meds: increased clonazepam to about 1.5 mg HS DT recent insomnia, lexapro 20, Dayvigo 10 mg HS, concerta 36 mg AM, trazodone 100 mg HS. Patient was administered Spravato 84 mg intranasally today.  The patient experienced the typical dissociation which gradually resolved over the 2-hour period of observation.  There were no complications.  Specifically the patient did not have nausea or vomiting or headache.  Specifically HA is less of a problem with Spravato. No SE with meds. Intensity of Spravato dissociation varies.  No  scary and well tolerated. Continues to gradually improve with Spravato.  She is more motivated and enjoying things more.  H sees progress.  Wants to continue twice weekly until gets maximum response.  Dep is not gone yet.  06/30/22 appt noted:  seen with H Current meds: increased clonazepam to about 1.5 mg HS DT recent insomnia, lexapro 20, Dayvigo 10 mg HS, concerta 36 mg AM, trazodone 100 mg HS. Patient was administered Spravato 84 mg intranasally today.  The patient experienced the typical dissociation which gradually resolved over the 2-hour period of observation.  There were no complications.  Specifically the patient did not have nausea or vomiting or headache.  Specifically HA is less of a problem with Spravato. No SE with meds. Intensity of Spravato dissociation varies.  No scary and well tolerated. Continues to gradually improve with Spravato.  She is more motivated and enjoying things more.  H sees even more progress than she does; more active and smiling.  Wants to continue twice weekly until gets maximum response.  Dep is 80% better.  07/07/22 appt noted: Current meds: increased clonazepam to about 1.5 mg HS DT recent insomnia, lexapro 20, Dayvigo 10 mg HS, concerta 36 mg AM, trazodone 100 mg HS. Patient was administered Spravato 84 mg intranasally today.  The patient experienced the typical dissociation which gradually resolved over the 2-hour period of observation.  There were no complications.  Specifically the patient  did not have nausea or vomiting or headache.  Specifically HA is less of a problem with Spravato. No SE with meds. Intensity of Spravato dissociation varies.  No scary and well tolerated.  Was more intese today. Overall mood is markedly better and please with meds. No changes desired.  07/09/22 appt noted: In transition from Lexapro to sertraline and missing Concerta bc CO crash from it after about 4 hours.  Disc these concerns.  She'll discuss also with [name],  NP More dep the last couple of days and concerned about reducing Spravato frequency while transition from one antidep to another.  Affect less positive. Patient was administered Spravato 84 mg intranasally today.  The patient experienced the typical dissociation which gradually resolved over the 2-hour period of observation.  There were no complications.  Specifically the patient did not have nausea or vomiting or headache.  Specifically HA is less of a problem with Spravato. No SE with meds. Intensity of Spravato dissociation varies.  No scary and well tolerated.   07/14/22 appt noted: Has been more depressed this week.  More trouble with sleep.  Taking clonazepam 1-1.5  of the 0.5 mg tablets.   Trazodone not helping much. She switched back from 200 sertraline to Lexapro 20.  No SE More trouble with motivation.  Some stress with son with special needs. Misses the benevit of Concerta but it was too short acting and crashed in 4-6 hours. Patient was administered Spravato 84 mg intranasally today.  The patient experienced the typical dissociation which gradually resolved over the 2-hour period of observation.  There were no complications.  Specifically the patient did not have nausea or vomiting or headache.  Specifically HA is less of a problem with Spravato. No SE with meds. Intensity of Spravato dissociation varies.  No scary and well tolerated.   07/17/22 appt noted: Patient was administered Spravato 84 mg intranasally today.  The patient experienced the typical dissociation which gradually resolved over the 2-hour period of observation.  There were no complications.  Specifically the patient did not have nausea or vomiting or headache.  Specifically HA is less of a problem with Spravato. No SE with meds. Intensity of Spravato dissociation varies.  Not scary and well tolerated.  Just got Jornay last night but wasn't sure how to take it.  Wants to start and this was discussed.  She felt MPH helped  mood and attn but didn't last long enough and had crash after a few hours on Concerta.   07/22/22 appt noted: Patient was administered Spravato 84 mg intranasally today.  The patient experienced the typical dissociation which gradually resolved over the 2-hour period of observation.  There were no complications.  Specifically the patient did not have nausea or vomiting or headache.  Specifically HA is less of a problem with Spravato. No SE with meds. Intensity of Spravato dissociation varies.  Not scary and well tolerated.  Start Jornay 20 mg over the weekend and did not notice any particular effect good or bad.  Increased to 40 mg. Other psych meds: Clonazepam 0.5 mg tablets 2 nightly, gabapentin dosage varies, Dayvigo 10 mg nightly, Lexapro 20 mg daily, to trazodone 100 mg nightly  07/24/22 appt noted: Patient was administered Spravato 84 mg intranasally today.  The patient experienced the typical dissociation which gradually resolved over the 2-hour period of observation.  There were no complications.  Specifically the patient did not have nausea or vomiting or headache.  Specifically HA is less of a problem  with Spravato. No SE with meds. Intensity of Spravato dissociation varies.  Not scary and well tolerated.  Other psych meds: Clonazepam 0.5 mg tablets 2 nightly, gabapentin dosage varies, Dayvigo 10 mg nightly, Lexapro 20 mg daily, to trazodone 100 mg nightly, Jornay 40 mg PM Doing well with Spravato.  Noticed a little effect from the Jornay 40 without SE but would like to increase the dose for ADD and mood.  Sleeping well.  No new concerns.  Still more depressed than she was with th initial benefit of Spravato.  07/30/22 note:  Patient was administered Spravato 84 mg intranasally today.  The patient experienced the typical dissociation which gradually resolved over the 2-hour period of observation.  There were no complications.  Specifically the patient did not have nausea or vomiting or  headache.  Specifically HA is less of a problem with Spravato. Other psych meds: Clonazepam 0.5 mg tablets 2 nightly, gabapentin dosage varies, Dayvigo 10 mg nightly, Lexapro 20 mg daily, to trazodone 100 mg nightly, Jornay 60 mg PM Wants to increase Jornay to 80 mg PM to increase benefit for ADD and depression. No SE She has experienced a major family crisis that threatens to change her daily life from now on.  It has not triggered suicidal thoughts at this time but she is very afraid.  No desire to change medications.    Past Psychiatric Medication Trials: Trintellix - Initially effective and then was not effective when re-started Paxil- Ineffective. Helped initially. Prozac-Insomnia Lexapro- Effective and then no longer as effective Zoloft- Initially effective and then not as effective.  Viibryd Cymbalta- Ineffective. Helped initially. Effexor XR- Effective, well tolerated. Pristiq- Increased anxiety at 150 mg daily Wellbutrin XL-Ineffective.May have increased anxiety. Amitriptyline Nortriptyline-Caused irritability, constipation Auvelity- ineffective Abilify- Helpful but caused severe insomnia. Rexulti-Helpful but caused insomnia. Vraylar Seroquel - Ineffective Risperdal Olanzapine- Ineffective Latuda- Ineffective Caplyta Klonopin- Effective Temazepam- Took during menopause Lunesta- Ineffective Ambien-Ineffective Sonata- Ineffective Dayvigo- partially effective Trazodone- Ineffective Remeron-Ineffective. Does not recall wt gain.  Adderall- Took once and had adverse effect. Concerta- Effective but only 4-6 hours Metadate-Not as effective as Concerta Dexmethylphenidate- Not as effective as Concerta Azstarys- some side effects. Not as effective.  Lamictal- May have had an adverse effects. "Felt weird." Reports worsening s/s.  Lithium Gabapentin Pramipexole- "felt really weird."   ECT #20 with minimal response. H says it was partly helpful.  AIMS    Flowsheet Row  Video Visit from 01/03/2022 in [facility]  AIMS Total Score 0      ECT-MADRS    Flowsheet Row Video Visit from 04/29/2022 in [facility] Video Visit from 02/06/2022 in [facility]  MADRS Total Score 44 23      PHQ2-9    Flowsheet Row Video Visit from 02/06/2022 in [facility] Office Visit from 11/16/2020 in [facility] Office Visit from 04/19/2018 in [facility] Total Score 6 0 0  PHQ-9 Total Score 14 -- 2        Review of Systems:  Review of Systems  Constitutional:  Positive for fatigue.  Cardiovascular:  Negative for palpitations.  Neurological:  Negative for tremors.  Psychiatric/Behavioral:  Positive for dysphoric mood. Negative for agitation and decreased concentration. The patient is nervous/anxious.     Medications: I have reviewed the patient's current medications.  Current Outpatient Medications  Medication Sig Dispense Refill  doxycycline (VIBRAMYCIN) 50 MG capsule Take 50 mg by mouth daily.     escitalopram (LEXAPRO) 20 MG tablet Take 1/2 tablet daily for 7 days, then stop (Patient taking differently: Take 20 mg by mouth daily. Take 1/2 tablet daily for 7 days, then stop) 30 tablet 2   Esketamine HCl, 84 MG Dose, (SPRAVATO, 84 MG DOSE,) 28 MG/DEVICE SOPK USE 3 SPRAYS IN EACH NOSTRIL EVERY 3 DAYS 3 each 3   estradiol (ESTRACE) 0.1 MG/GM vaginal cream USE 1 GM VAGINALLY TWICE A WEEK AT BEDTIME 42.5 g 0   gabapentin (NEURONTIN) 800 MG tablet Take 1 tablet (800 mg total) by mouth at bedtime. 90 tablet 1   Lemborexant (DAYVIGO) 10 MG TABS Take 1/2-1 tab po QHS 30 tablet 2   MAGNESIUM PO Take by mouth.     Methylphenidate HCl ER, PM, (JORNAY PM) 80 MG CP24 Take 1 capsule (80 mg total) by mouth every evening. 30 capsule 0   Multiple Vitamin (MULTIVITAMIN) tablet Take 1  tablet by mouth daily.     sertraline (ZOLOFT) 100 MG tablet Take 1/2 tablet daily for 5 days, then 1 tablet daily for 5 days, then 1.5 tablets daily 45 tablet 1   traZODone (DESYREL) 100 MG tablet Take 1 tablet (100 mg total) by mouth at bedtime. Take 1-2 tabs po QHS prn 180 tablet 1   valACYclovir (VALTREX) 500 MG tablet Take one twice daily for 3 days with any symptoms 30 tablet 3   clonazePAM (KLONOPIN) 0.5 MG tablet 2 tablets at night and 1 tablet as needed daily for panic anxiety 75 tablet 1   gabapentin (NEURONTIN) 100 MG capsule Take 1 capsule (100 mg total) by mouth 3 (three) times daily as needed. Take 1 capsule three times daily 90 capsule 1   No current facility-administered medications for this visit.    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   TRANSAMINASES, SERUM, ELEVATED 08/14/2009    Past Medical History, Surgical history, Social history, and Family history were reviewed and updated as appropriate.   Please see review of systems for further details on the patient's review from today.   Objective:   Physical Exam:  LMP 12/27/2007 (Exact Date)   Physical Exam Constitutional:      General: She is not in acute distress. Musculoskeletal:        General: No deformity.  Neurological:     Mental Status: She is alert and oriented to person, place, and time.     Sensory: Sensory deficit present.     Coordination: Coordination normal.  Psychiatric:        Attention and Perception: Attention and perception normal. She does not perceive auditory or visual hallucinations.        Mood and  Affect: Mood is anxious and depressed. Affect is blunt. Affect is not labile.        Speech: Speech normal. Speech is not slurred.        Behavior: Behavior normal.        Thought Content:  Thought content normal. Thought content is not delusional. Thought content does not include homicidal or suicidal ideation. Thought content does not include suicidal plan.        Cognition and Memory: Cognition and memory normal.        Judgment: Judgment normal.     Comments: Insight intact More down twith stress.     Lab Review:     Component Value Date/Time   NA 138 04/22/2022 0932   K 4.1 04/22/2022 0932   CL 98 04/22/2022 0932   CO2 31 04/22/2022 0932   GLUCOSE 102 (H) 04/22/2022 0932   BUN 10 04/22/2022 0932   CREATININE 0.71 04/22/2022 0932   CREATININE 0.68 01/26/2020 0920   CALCIUM 10.5 04/22/2022 0932   PROT 8.3 04/22/2022 0932   ALBUMIN 5.3 (H) 04/22/2022 0932   AST 82 (H) 04/22/2022 0932   ALT 118 (H) 04/22/2022 0932   ALKPHOS 74 04/22/2022 0932   BILITOT 0.3 04/22/2022 0932   GFRNONAA >90 05/01/2014 1645   GFRAA >90 05/01/2014 1645       Component Value Date/Time   WBC 3.3 (L) 04/22/2022 0932   RBC 4.25 04/22/2022 0932   HGB 14.3 04/22/2022 0932   HGB 14.1 10/26/2014 1034   HCT 40.7 04/22/2022 0932   PLT 245.0 04/22/2022 0932   MCV 95.7 04/22/2022 0932   MCH 34.4 (H) 01/26/2020 0920   MCHC 35.1 04/22/2022 0932   RDW 13.2 04/22/2022 0932   LYMPHSABS 0.9 04/22/2022 0932   MONOABS 0.2 04/22/2022 0932   EOSABS 0.1 04/22/2022 0932   BASOSABS 0.0 04/22/2022 0932    No results found for: "POCLITH", "LITHIUM"   No results found for: "PHENYTOIN", "PHENOBARB", "VALPROATE", "CBMZ"   .res Assessment: Plan:    Aarshi was seen today for follow-up, depression, anxiety, stress, sleeping problem and add.  Diagnoses and all orders for this visit:  Recurrent major depression resistant to treatment  Stressful life event affecting family  Attention deficit hyperactivity  disorder (ADHD), predominantly inattentive type -     Methylphenidate HCl ER, PM, (JORNAY PM) 80 MG CP24; Take 1 capsule (80 mg total) by mouth every evening.  Generalized anxiety disorder  Insomnia, unspecified type -     clonazePAM (KLONOPIN) 0.5 MG tablet; 2 tablets at night and 1 tablet as needed daily for panic anxiety  Anxiety disorder, unspecified type -     clonazePAM (KLONOPIN) 0.5 MG tablet; 2 tablets at night and 1 tablet as needed daily for panic anxiety    Pt seen for TRD and administration of Spravato.  Patient was administered Spravato 84 mg intranasally today.  The patient experienced the typical dissociation which gradually resolved over the 2-hour period of observation.  There were no complications.  Specifically the patient did not have nausea or vomiting. But mild headache.  Blood pressures remained within normal ranges at the 40-minute and 2-hour follow-up intervals.  By the time the 2-hour observation period was met the patient was alert and oriented and able to exit without assistance.  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  See nursing note for further details.Tolerated the Spravato to 84mg  .  Regarding med options.  She has obviously been thoroughly tried on numerous medications.  The only antidepressant category that she has not taken is MAO inhibitors which are not compatible with Spravato.  The only other options with reasonable chance of success might be to use the most successful antidepressants  at higher than recommended maximum dose for treatment resistant status. Disc this with her previously.    We discussed the short-term risks associated with benzodiazepines including sedation and increased fall risk among others.  Discussed long-term side effect risk including dependence, potential withdrawal symptoms, and the potential eventual dose-related risk of dementia.  But  recent studies from 2020 dispute this association between benzodiazepines and dementia risk. Newer studies in 2020 do not support an association with dementia.  Less intense depression with Spravato but starting to relapse ? Re: stopping Concerta bc of the crash.   Wants to continue the tx plan  Tolerating meds.  Satisfied she is making progress Continue transition to sertraline from Lexapro.  She switched back to Lexapro with inadequate trial of sertraline.  Unclear which is best but will focus on sleep and getting stimulant doses solved.  Easiest trial for insomnia, clonazepam 1 mg HS. trazodone doesn't work.  Consider other long acting MPH like Cotempla, Jornay PM, Daytrana bc she gets mood benefit with MPH and concerta is too short acting.   Increase Jornay 80 PM for longer acting smooth distribution of MPH which helped ADD and mood.    Crisis intervention around the events that have occurred in her family that may change her life from this point forward.  She is managing with expected emotional distress but not suicidal thought.  She does not desire any further medication changes.  Consider again the possibility of retrying the sertraline as it does sometimes have a better antianxiety effect than does Lexapro.  FU twice weekly Spravato until improvement maxes out then drop back to weekly.  She wants to   Meredith Staggers, MD, DFAPA    Please see After Visit Summary for patient specific instructions.  Future Appointments  Date Time Provider Department Center  08/19/2022  1:55 PM Jerene Bears, MD [facility]    No orders of the defined types were placed in this encounter.   ------------------------------- T

## 2022-07-31 NOTE — Progress Notes (Signed)
NURSES NOTE:     Patient arrived for her 45 st Spravato treatment. Pt is being treated for Treatment Resistant Depression, the starting dose is 56 mg (2 of the 28 mg) nasal sprays. Pt will be receiving 84 mg, this is her maintenance dose. Patient taken to treatment room, I explained and discussed her treatment and how the schedule should go, along with any side effects that may occur. Answered any questions and concerns the patient had. Pt's Spravato is delivered through [place] and she is now billing with her insurance. Spravato medication is stored at doctors office per REMS/FDA guidelines. The medication is required to be locked behind two doors per FDA/REMS Protocol. Medication is also disposed of properly per regulations.      Vital Signs assessed at 8:05 AM 125/71, pulse 67, pulse ox 92%. Instructed pt to blow her nose and recline back slightly to prevent any of medication dripping out of her nose.  We did discuss increasing her Jornay to 80 mg beginning tonight. Pt also reports having traumatizing news last Thursday related to her husband and obviously very upset and having panic attacks. Informed her I would inform Dr. Clovis Pu and [name] of the situation, she was appreciative.  Pt. Given 1st dose (28 mg inhaler), then 5 minutes between 2nd dose and 3rd dose. No complaints of nausea/vomiting reported. Pt did listen to music today, she just reclined back and closed her eyes. After 40 minutes I went to assess her vital signs, no side effects or symptoms reported, B/P 131/73, pulse 59. Informed pt could rest in the chair Dr. Clovis Pu came in to discuss her treatment at the end. She will continue twice a week Spravato treatments. Nurse was with pt a total of 60 minutes but pt observed for 120 minutes per protocol. Discharge vital signs were at 9:55 AM, B/P 131/80, pulse 67. She reports dissociation she was clear upon her discharge. She scheduled her next apt  April 8th. Advised to contact me with any concerns  or issues prior to her apt next week.    LOT # [id number] EXP [id number]

## 2022-07-31 NOTE — Progress Notes (Signed)
NURSES NOTE:     Patient arrived for her 94 th Spravato treatment. Pt is being treated for Treatment Resistant Depression, the starting dose is 56 mg (2 of the 28 mg) nasal sprays. Pt will be receiving 84 mg, this is her maintenance dose. Patient taken to treatment room, I explained and discussed her treatment and how the schedule should go, along with any side effects that may occur. Answered any questions and concerns the patient had. Pt's Spravato is delivered through [place] and she is now billing with her insurance. Spravato medication is stored at doctors office per REMS/FDA guidelines. The medication is required to be locked behind two doors per FDA/REMS Protocol. Medication is also disposed of properly per regulations.      Vital Signs assessed at 8:05 AM 130/82, pulse 58, pulse ox 92%. Instructed pt to blow her nose and recline back slightly to prevent any of medication dripping out of her nose.  We did discuss increasing her Jornay to 60 mg beginning tonight.  Pt. Given 1st dose (28 mg inhaler), then 5 minutes between 2nd dose and 3rd dose. No complaints of nausea/vomiting reported. Pt did listen to music today, she just reclined back and closed her eyes. After 40 minutes I went to assess her vital signs, no side effects or symptoms reported, B/P 132/79, pulse 60. Informed pt could rest in the chair Dr. Clovis Pu came in to discuss her treatment at the end. She will continue twice a week Spravato treatments. Nurse was with pt a total of 60 minutes but pt observed for 120 minutes per protocol. Discharge vital signs were at 10:00 AM, B/P 129/82, pulse 60. She reports dissociation she was clear upon her discharge, she left with her husband. She scheduled her next apt April 1st. Advised to contact me with any concerns or issues prior to her apt next week.    LOT # [id number] EXP [id number]

## 2022-08-01 ENCOUNTER — Encounter: Payer: Self-pay | Admitting: Psychiatry

## 2022-08-04 ENCOUNTER — Encounter: Payer: 59 | Admitting: Psychiatry

## 2022-08-04 ENCOUNTER — Ambulatory Visit: Payer: 59

## 2022-08-04 ENCOUNTER — Ambulatory Visit (INDEPENDENT_AMBULATORY_CARE_PROVIDER_SITE_OTHER): Payer: 59 | Admitting: Psychiatry

## 2022-08-04 VITALS — BP 119/76 | HR 65

## 2022-08-04 DIAGNOSIS — G4700 Insomnia, unspecified: Secondary | ICD-10-CM

## 2022-08-04 DIAGNOSIS — F339 Major depressive disorder, recurrent, unspecified: Secondary | ICD-10-CM

## 2022-08-04 DIAGNOSIS — F411 Generalized anxiety disorder: Secondary | ICD-10-CM

## 2022-08-04 DIAGNOSIS — Z6379 Other stressful life events affecting family and household: Secondary | ICD-10-CM

## 2022-08-04 DIAGNOSIS — F900 Attention-deficit hyperactivity disorder, predominantly inattentive type: Secondary | ICD-10-CM | POA: Diagnosis not present

## 2022-08-04 MED ORDER — METHYLPHENIDATE HCL 20 MG PO TABS
20.0000 mg | ORAL_TABLET | Freq: Three times a day (TID) | ORAL | 0 refills | Status: DC
Start: 2022-08-04 — End: 2022-08-07

## 2022-08-04 NOTE — Progress Notes (Signed)
Heather Davila [id number] 1960/06/06 62 y.o.  Subjective:   Patient ID:  Heather Davila is a 62 y.o. (DOB 1960/09/30) female.  Chief Complaint:  Chief Complaint  Patient presents with   Follow-up   Depression   ADD   Stress    HPI Heather Davila presents to the office today for follow-up of treatment resistant recurrent depression, generalized anxiety disorder and insomnia.  Almost too numerous to count med failures. She is here for Spravato administration for her treatment resistant depression.  05/21/22 appt noted: Patient was administered first dose Spravato 56 mg intranasally today.  The patient experienced the typical dissociation which gradually resolved over the 2-hour period of observation.  There were no complications.  Specifically the patient did not have nausea or vomiting or headache.   Dissociation was mild. Did not experience any emotional relief from disabling depression.wants to increase Spravato to the usual dose.  She is aware insurance is not covering the costs at this time.  No SI acutely. Tolerating meds.    05/23/22 appt noted: Patient was administered Spravato 84 mg intranasally today.  The patient experienced the typical dissociation which gradually resolved over the 2-hour period of observation.  There were no complications.  Specifically the patient did not have nausea or vomiting.   Dissociation was greater than last dose and a little bothersome DT some HA. Tolerating meds.   Mood has not changed significantly thus far with Spravato.  05/27/22 appt noted: Patient was administered Spravato 84 mg intranasally today.  The patient experienced the typical dissociation which gradually resolved over the 2-hour period of observation.  There were no complications.  Specifically the patient did not have nausea or vomiting or headache. Today she has felt a little relief from the pressing heaviness of depression but a little more anxiety.  During administration of Spravato  she listens to [place] music and was able to relax a little more with the feeling of dissociation that was mildly bothersome. Husband was also in on the session today and asked some questions about IV ketamine versus nasal spray ketamine.  05/29/22 appt noted: Cancelled today DT hypertension  05/30/22 appt noted: Patient was administered Spravato 84 mg intranasally today.  The patient experienced the typical dissociation which gradually resolved over the 2-hour period of observation.  There were no complications.  Specifically the patient did not have nausea or vomiting or headache. She is seeing some improvement in mood with less continuous depression and less intensity.  Hopefulness is better.   No med complaints or SE  06/05/22 appt noted: Patient was administered Spravato 84 mg intranasally today.  The patient experienced the typical dissociation which gradually resolved over the 2-hour period of observation.  There were no complications.  Specifically the patient did not have nausea or vomiting or headache.  Specifically HA is less of a problem with Spravato. No SE with meds. Depression is improving with less intensity.  Intermittent anxiety easily.  More able to enjoy things.  No new concerns.  06/17/22 appt noted: Patient was administered Spravato 84 mg intranasally today.  The patient experienced the typical dissociation which gradually resolved over the 2-hour period of observation.  There were no complications.  Specifically the patient did not have nausea or vomiting or headache.  Specifically HA is less of a problem with Spravato. No SE with meds. Depression is improved 45% with less intensity.  Intermittent anxiety less easily.  More able to enjoy things.  No new concerns.  More active. Current meds: increased  clonazepam to about 1.5 mg HS DT recent insomnia, lexapro 20, Dayvigo 10 mg HS, concerta 36 mg AM, trazodone 100 mg HS.  06/20/22 appt noted: Current meds: increased clonazepam to  about 1.5 mg HS DT recent insomnia, lexapro 20, Dayvigo 10 mg HS, concerta 36 mg AM, trazodone 100 mg HS. Patient was administered Spravato 84 mg intranasally today.  The patient experienced the typical dissociation which gradually resolved over the 2-hour period of observation.  There were no complications.  Specifically the patient did not have nausea or vomiting or headache.  Specifically HA is less of a problem with Spravato. No SE with meds. Depression is improved 45% with less intensity.  Intermittent anxiety less easily.  More able to enjoy things.  No new concerns.  More active.  06/23/22 appt noted:  Current meds: increased clonazepam to about 1.5 mg HS DT recent insomnia, lexapro 20, Dayvigo 10 mg HS, concerta 36 mg AM, trazodone 100 mg HS. Patient was administered Spravato 84 mg intranasally today.  The patient experienced the typical dissociation which gradually resolved over the 2-hour period of observation.  There were no complications.  Specifically the patient did not have nausea or vomiting or headache.  Specifically HA is less of a problem with Spravato. No SE with meds. Intensity of Spravato dissociation varies.  Last week very intesne and this week more typical.  Depression is continuing to improve with better interest and activity and motivation.  Gradual improvement.  Wants to continue.  06/25/22 appt noted: Current meds: increased clonazepam to about 1.5 mg HS DT recent insomnia, lexapro 20, Dayvigo 10 mg HS, concerta 36 mg AM, trazodone 100 mg HS. Patient was administered Spravato 84 mg intranasally today.  The patient experienced the typical dissociation which gradually resolved over the 2-hour period of observation.  There were no complications.  Specifically the patient did not have nausea or vomiting or headache.  Specifically HA is less of a problem with Spravato. No SE with meds. Intensity of Spravato dissociation varies.  No scary and well tolerated. Continues to  gradually improve with Spravato.  She is more motivated and enjoying things more.  H sees progress.  Wants to continue twice weekly until gets maximum response.  Dep is not gone yet.  06/30/22 appt noted:  seen with H Current meds: increased clonazepam to about 1.5 mg HS DT recent insomnia, lexapro 20, Dayvigo 10 mg HS, concerta 36 mg AM, trazodone 100 mg HS. Patient was administered Spravato 84 mg intranasally today.  The patient experienced the typical dissociation which gradually resolved over the 2-hour period of observation.  There were no complications.  Specifically the patient did not have nausea or vomiting or headache.  Specifically HA is less of a problem with Spravato. No SE with meds. Intensity of Spravato dissociation varies.  No scary and well tolerated. Continues to gradually improve with Spravato.  She is more motivated and enjoying things more.  H sees even more progress than she does; more active and smiling.  Wants to continue twice weekly until gets maximum response.  Dep is 80% better.  07/07/22 appt noted: Current meds: increased clonazepam to about 1.5 mg HS DT recent insomnia, lexapro 20, Dayvigo 10 mg HS, concerta 36 mg AM, trazodone 100 mg HS. Patient was administered Spravato 84 mg intranasally today.  The patient experienced the typical dissociation which gradually resolved over the 2-hour period of observation.  There were no complications.  Specifically the patient did not have nausea or vomiting or  headache.  Specifically HA is less of a problem with Spravato. No SE with meds. Intensity of Spravato dissociation varies.  No scary and well tolerated.  Was more intese today. Overall mood is markedly better and please with meds. No changes desired.  07/09/22 appt noted: In transition from Lexapro to sertraline and missing Concerta bc CO crash from it after about 4 hours.  Disc these concerns.  She'll discuss also with Corie Chiquito, NP More dep the last couple of days and  concerned about reducing Spravato frequency while transition from one antidep to another.  Affect less positive. Patient was administered Spravato 84 mg intranasally today.  The patient experienced the typical dissociation which gradually resolved over the 2-hour period of observation.  There were no complications.  Specifically the patient did not have nausea or vomiting or headache.  Specifically HA is less of a problem with Spravato. No SE with meds. Intensity of Spravato dissociation varies.  No scary and well tolerated.   07/14/22 appt noted: Has been more depressed this week.  More trouble with sleep.  Taking clonazepam 1-1.5  of the 0.5 mg tablets.   Trazodone not helping much. She switched back from 200 sertraline to Lexapro 20.  No SE More trouble with motivation.  Some stress with son with special needs. Misses the benevit of Concerta but it was too short acting and crashed in 4-6 hours. Patient was administered Spravato 84 mg intranasally today.  The patient experienced the typical dissociation which gradually resolved over the 2-hour period of observation.  There were no complications.  Specifically the patient did not have nausea or vomiting or headache.  Specifically HA is less of a problem with Spravato. No SE with meds. Intensity of Spravato dissociation varies.  No scary and well tolerated.   07/17/22 appt noted: Patient was administered Spravato 84 mg intranasally today.  The patient experienced the typical dissociation which gradually resolved over the 2-hour period of observation.  There were no complications.  Specifically the patient did not have nausea or vomiting or headache.  Specifically HA is less of a problem with Spravato. No SE with meds. Intensity of Spravato dissociation varies.  Not scary and well tolerated.  Just got Jornay last night but wasn't sure how to take it.  Wants to start and this was discussed.  She felt MPH helped mood and attn but didn't last long enough  and had crash after a few hours on Concerta.   07/22/22 appt noted: Patient was administered Spravato 84 mg intranasally today.  The patient experienced the typical dissociation which gradually resolved over the 2-hour period of observation.  There were no complications.  Specifically the patient did not have nausea or vomiting or headache.  Specifically HA is less of a problem with Spravato. No SE with meds. Intensity of Spravato dissociation varies.  Not scary and well tolerated.  Start Jornay 20 mg over the weekend and did not notice any particular effect good or bad.  Increased to 40 mg. Other psych meds: Clonazepam 0.5 mg tablets 2 nightly, gabapentin dosage varies, Dayvigo 10 mg nightly, Lexapro 20 mg daily, to trazodone 100 mg nightly  07/24/22 appt noted: Patient was administered Spravato 84 mg intranasally today.  The patient experienced the typical dissociation which gradually resolved over the 2-hour period of observation.  There were no complications.  Specifically the patient did not have nausea or vomiting or headache.  Specifically HA is less of a problem with Spravato. No SE with meds. Intensity  of Spravato dissociation varies.  Not scary and well tolerated.  Other psych meds: Clonazepam 0.5 mg tablets 2 nightly, gabapentin dosage varies, Dayvigo 10 mg nightly, Lexapro 20 mg daily, to trazodone 100 mg nightly, Jornay 40 mg PM Doing well with Spravato.  Noticed a little effect from the Jornay 40 without SE but would like to increase the dose for ADD and mood.  Sleeping well.  No new concerns.  Still more depressed than she was with th initial benefit of Spravato.  07/30/22 note:  Patient was administered Spravato 84 mg intranasally today.  The patient experienced the typical dissociation which gradually resolved over the 2-hour period of observation.  There were no complications.  Specifically the patient did not have nausea or vomiting or headache.  Specifically HA is less of a problem  with Spravato. Other psych meds: Clonazepam 0.5 mg tablets 2 nightly, gabapentin dosage varies, Dayvigo 10 mg nightly, Lexapro 20 mg daily, to trazodone 100 mg nightly, Jornay 60 mg PM Wants to increase Jornay to 80 mg PM to increase benefit for ADD and depression. No SE She has experienced a major family crisis that threatens to change her daily life from now on.  It has not triggered suicidal thoughts at this time but she is very afraid.  No desire to change medications.  08/04/22 appt noted" Patient was administered Spravato 84 mg intranasally today.  The patient experienced the typical dissociation which gradually resolved over the 2-hour period of observation.  There were no complications.  Specifically the patient did not have nausea or vomiting or headache.  Specifically HA is less of a problem with Spravato. Other psych meds: Clonazepam 0.5 mg tablets 2 nightly, gabapentin dosage varies, Dayvigo 10 mg nightly, Lexapro 20 mg daily, trazodone 100 mg nightly, Jornay 80 mg PM No SE No benefit or SE with increase Jormay 80 mg.  Says Concerta helped ADD and depression but not Jornay.  However duration was insufficient.  Still depressed and wants change to another form of stimulant.  No concerns with other meds. Continues with strong family stressors creating uncertainty about her future but no quick resolution. No SI   Past Psychiatric Medication Trials: Trintellix - Initially effective and then was not effective when re-started Paxil- Ineffective. Helped initially. Prozac-Insomnia Lexapro- Effective and then no longer as effective Zoloft- Initially effective and then not as effective.  Viibryd Cymbalta- Ineffective. Helped initially. Effexor XR- Effective, well tolerated. Pristiq- Increased anxiety at 150 mg daily Wellbutrin XL-Ineffective.May have increased anxiety. Amitriptyline Nortriptyline-Caused irritability, constipation Auvelity- ineffective Abilify- Helpful but caused severe  insomnia. Rexulti-Helpful but caused insomnia. Vraylar Seroquel - Ineffective Risperdal Olanzapine- Ineffective Latuda- Ineffective Caplyta Klonopin- Effective Temazepam- Took during menopause Lunesta- Ineffective Ambien-Ineffective Sonata- Ineffective Dayvigo- partially effective Trazodone- Ineffective Remeron-Ineffective. Does not recall wt gain.  Adderall- Took once and had adverse effect. Concerta- Effective but only 4-6 hours Metadate-Not as effective as Concerta Dexmethylphenidate- Not as effective as Concerta Azstarys- some side effects. Not as effective.  Lamictal- May have had an adverse effects. "Felt weird." Reports worsening s/s.  Lithium Gabapentin Pramipexole- "felt really weird."   ECT #20 with minimal response. H says it was partly helpful.  AIMS    Flowsheet Row Video Visit from 01/03/2022 in [facility]  AIMS Total Score 0      ECT-MADRS    Flowsheet Row Video Visit from 04/29/2022 in [facility] Video Visit from 02/06/2022 in [facility]  MADRS Total Score  44 23      PHQ2-9    Flowsheet Row [facility] Office Visit from [facility]16/2020 [facility]  PHQ-2 Total Score 6 0 0  PHQ-9 Total Score 14 -- 2        Review of Systems:  Review of Systems  Constitutional:  Positive for fatigue.  Cardiovascular:  Negative for chest pain and palpitations.  Neurological:  Negative for tremors.  Psychiatric/Behavioral:  Positive for dysphoric mood. Negative for agitation and decreased concentration. The patient is nervous/anxious.     Medications: I have reviewed the patient's current medications.  Current Outpatient Medications  Medication Sig Dispense Refill   clonazePAM (KLONOPIN) 0.5  MG tablet 2 tablets at night and 1 tablet as needed daily for panic anxiety 75 tablet 1   doxycycline (VIBRAMYCIN) 50 MG capsule Take 50 mg by mouth daily.     escitalopram (LEXAPRO) 20 MG tablet Take 1/2 tablet daily for 7 days, then stop (Patient taking differently: Take 20 mg by mouth daily. Take 1/2 tablet daily for 7 days, then stop) 30 tablet 2   Esketamine HCl, 84 MG Dose, (SPRAVATO, 84 MG DOSE,) 28 MG/DEVICE SOPK USE 3 SPRAYS IN EACH NOSTRIL EVERY 3 DAYS 3 each 3   estradiol (ESTRACE) 0.1 MG/GM vaginal cream USE 1 GM VAGINALLY TWICE A WEEK AT BEDTIME 42.5 g 0   gabapentin (NEURONTIN) 800 MG tablet Take 1 tablet (800 mg total) by mouth at bedtime. 90 tablet 1   Lemborexant (DAYVIGO) 10 MG TABS Take 1/2-1 tab po QHS 30 tablet 2   MAGNESIUM PO Take by mouth.     methylphenidate (RITALIN) 20 MG tablet Take 1 tablet (20 mg total) by mouth 3 (three) times daily with meals. 90 tablet 0   Multiple Vitamin (MULTIVITAMIN) tablet Take 1 tablet by mouth daily.     traZODone (DESYREL) 100 MG tablet Take 1 tablet (100 mg total) by mouth at bedtime. Take 1-2 tabs po QHS prn 180 tablet 1   valACYclovir (VALTREX) 500 MG tablet Take one twice daily for 3 days with any symptoms 30 tablet 3   gabapentin (NEURONTIN) 100 MG capsule Take 1 capsule (100 mg total) by mouth 3 (three) times daily as needed. Take 1 capsule three times daily 90 capsule 1   No current facility-administered medications for this visit.    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   TRANSAMINASES, SERUM, ELEVATED 08/14/2009    Past Medical History,  Surgical history, Social history, and Family history were reviewed and updated as appropriate.   Please see review of systems for further details on the patient's review from today.   Objective:   Physical Exam:  LMP 12/27/2007 (Exact Date)   Physical Exam Constitutional:      General: She is not in acute distress. Musculoskeletal:        General: No deformity.  Neurological:  Mental Status: She is alert and oriented to person, place, and time.     Sensory: Sensory deficit present.     Coordination: Coordination normal.  Psychiatric:        Attention and Perception: Attention and perception normal. She does not perceive auditory or visual hallucinations.        Mood and Affect: Mood is anxious and depressed. Affect is blunt. Affect is not labile.        Speech: Speech normal. Speech is not slurred.        Behavior: Behavior normal.        Thought Content: Thought content normal. Thought content is not delusional. Thought content does not include homicidal or suicidal ideation. Thought content does not include suicidal plan.        Cognition and Memory: Cognition and memory normal.        Judgment: Judgment normal.     Comments: Insight intact More down with stress and worried.     Lab Review:     Component Value Date/Time   NA 138 04/22/2022 0932   K 4.1 04/22/2022 0932   CL 98 04/22/2022 0932   CO2 31 04/22/2022 0932   GLUCOSE 102 (H) 04/22/2022 0932   BUN 10 04/22/2022 0932   CREATININE 0.71 04/22/2022 0932   CREATININE 0.68 01/26/2020 0920   CALCIUM 10.5 04/22/2022 0932   PROT 8.3 04/22/2022 0932   ALBUMIN 5.3 (H) 04/22/2022 0932   AST 82 (H) 04/22/2022 0932   ALT 118 (H) 04/22/2022 0932   ALKPHOS 74 04/22/2022 0932   BILITOT 0.3 04/22/2022 0932   GFRNONAA >90 05/01/2014 1645   GFRAA >90 05/01/2014 1645       Component Value Date/Time   WBC 3.3 (L) 04/22/2022 0932   RBC 4.25 04/22/2022 0932   HGB 14.3 04/22/2022 0932   HGB 14.1 10/26/2014 1034   HCT  40.7 04/22/2022 0932   PLT 245.0 04/22/2022 0932   MCV 95.7 04/22/2022 0932   MCH 34.4 (H) 01/26/2020 0920   MCHC 35.1 04/22/2022 0932   RDW 13.2 04/22/2022 0932   LYMPHSABS 0.9 04/22/2022 0932   MONOABS 0.2 04/22/2022 0932   EOSABS 0.1 04/22/2022 0932   BASOSABS 0.0 04/22/2022 0932    No results found for: "POCLITH", "LITHIUM"   No results found for: "PHENYTOIN", "PHENOBARB", "VALPROATE", "CBMZ"   .res Assessment: Plan:    Helena was seen today for follow-up, depression, add and stress.  Diagnoses and all orders for this visit:  Recurrent major depression resistant to treatment -     methylphenidate (RITALIN) 20 MG tablet; Take 1 tablet (20 mg total) by mouth 3 (three) times daily with meals.  Stressful life event affecting family  Attention deficit hyperactivity disorder (ADHD), predominantly inattentive type -     methylphenidate (RITALIN) 20 MG tablet; Take 1 tablet (20 mg total) by mouth 3 (three) times daily with meals.  Generalized anxiety disorder  Insomnia, unspecified type    Pt seen for TRD and administration of Spravato.  Patient was administered Spravato 84 mg intranasally today.  The patient experienced the typical dissociation which gradually resolved over the 2-hour period of observation.  There were no complications.  Specifically the patient did not have nausea or vomiting. But mild headache.  Blood pressures remained within normal ranges at the 40-minute and 2-hour follow-up intervals.  By the time the 2-hour observation period was met the patient was alert and oriented and able to exit without assistance.  Patient feels the Spravato  administration is helpful for the treatment resistant depression and would like to continue the treatment.  See nursing note for further details.Tolerated the Spravato to 84mg  . Plan to continue twice daily to max response.  Regarding med options.  She has obviously been thoroughly tried on numerous medications.  The only  antidepressant category that she has not taken is MAO inhibitors which are not compatible with Spravato.  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 Disc this with her previously.    We discussed the short-term risks associated with benzodiazepines including sedation and increased fall risk among others.  Discussed long-term side effect risk including dependence, potential withdrawal symptoms, and the potential eventual dose-related risk of dementia.  But recent studies from 2020 dispute this association between benzodiazepines and dementia risk. Newer studies in 2020 do not support an association with dementia.  Less intense depression with Spravato but some relapse ? Re: stopping Concerta bc of the crash.    Tolerating meds.  Satisfied she is making progress Continue transition to sertraline from Lexapro.  She switched back to Lexapro with inadequate trial of sertraline.  Unclear which is best but will focus on sleep and getting stimulant doses solved. Consider retrial sertraline later for better anxiety management.  Easiest trial for insomnia, clonazepam 1 mg HS. trazodone doesn't work well.  Consider other long acting MPH like Cotempla,  Daytrana bc she gets mood benefit with MPH and concerta is too short acting but no sig response with [place].   First to stpeed response switch to Ritalin 20 TID for ADD and mood.    Crisis intervention around the events that have occurred in her family that may change her life from this point forward.  She is managing with expected emotional distress but not suicidal thought.  She does not desire any further medication changes except noted.  Consider again the possibility of retrying the sertraline as it does sometimes have a better antianxiety effect than does Lexapro.  FU twice weekly Spravato until improvement maxes out  then drop back to weekly.  She wants to   Meredith Staggers, MD, DFAPA    Please see After Visit Summary for patient specific instructions.  Future Appointments  Date Time Provider Department Center  08/07/2022  8:00 AM CP-NURSE [facility] None  08/07/2022  9:00 AM Cottle, Steva Ready., MD [facility] None  08/11/2022  2:00 PM Cottle, Steva Ready., MD [facility] None  08/11/2022  2:00 PM CP-NURSE CP-[facility] None  08/13/2022  8:00 AM CP-NURSE CP-CP None  08/13/2022  9:00 AM Cottle, Steva Ready., MD [facility] None  08/19/2022  1:55 PM Jerene Bears, MD [facility]  08/20/2022  8:00 AM CP-NURSE CP-CP None  08/20/2022  9:00 AM Cottle, Steva Ready., MD CP-CP None    No orders of the defined types were placed in this encounter.   ------------------------------- T

## 2022-08-05 ENCOUNTER — Other Ambulatory Visit: Payer: Self-pay | Admitting: Psychiatry

## 2022-08-05 DIAGNOSIS — F339 Major depressive disorder, recurrent, unspecified: Secondary | ICD-10-CM

## 2022-08-05 NOTE — Progress Notes (Signed)
NURSES NOTE:     Patient arrived for her 64 nd  Spravato treatment. Pt is being treated for Treatment Resistant Depression, the starting dose is 56 mg (2 of the 28 mg) nasal sprays. Pt will be receiving 84 mg (3 of the 28 mg) nasal sprays, this is her maintenance dose. Patient taken to treatment room, I explained and discussed her treatment and how the schedule should go, along with any side effects that may occur. Answered any questions and concerns the patient had. Pt's Spravato is delivered through [place] and she is now billing with her insurance. Spravato medication is stored at doctors office per REMS/FDA guidelines. The medication is required to be locked behind two doors per FDA/REMS Protocol. Medication is also disposed of properly per regulations. All treatments and her vital signs are documented in Spravato REMS per protocol of treatment center regulations.      Vital Signs assessed at 10:00 AM 125/81, pulse 61, pulse ox 92%. Instructed pt to blow her nose and recline back slightly to prevent any of medication dripping out of her nose.  Pt reports her Ophelia Charter is not working at all still even at 80 mg and asking to change to another medication, I did let Dr. Jennelle Human know prior to him discussing her treatment with her today.  We did discuss her husband's situation some again and she did share some feelings and the latest update. She is obviously very upset about the situation. Pt. Given 1st dose (28 mg inhaler), then 5 minutes between 2nd dose and 3rd dose. No complaints of nausea/vomiting reported. Pt did listen to music today, she just reclined back and closed her eyes. After 40 minutes I went to assess her vital signs, no side effects or symptoms reported, B/P 135/72, pulse 61. Informed pt could rest in the chair Dr. Jennelle Human came in to discuss her treatment at the end. She will continue twice a week Spravato treatments. Nurse was with pt a total of 60 minutes but pt observed for 120 minutes per  protocol. Discharge vital signs were at 11:45 AM, B/P 119/76, pulse 65. She reports dissociation but she was clear upon her discharge. She scheduled her next apt  April 11th. Advised to contact me with any concerns or issues prior to her apt next week.    LOT # [id number]

## 2022-08-07 ENCOUNTER — Ambulatory Visit (INDEPENDENT_AMBULATORY_CARE_PROVIDER_SITE_OTHER): Payer: 59 | Admitting: Psychiatry

## 2022-08-07 ENCOUNTER — Ambulatory Visit: Payer: 59

## 2022-08-07 VITALS — BP 118/75 | HR 71

## 2022-08-07 DIAGNOSIS — F339 Major depressive disorder, recurrent, unspecified: Secondary | ICD-10-CM

## 2022-08-07 DIAGNOSIS — G4700 Insomnia, unspecified: Secondary | ICD-10-CM

## 2022-08-07 DIAGNOSIS — F900 Attention-deficit hyperactivity disorder, predominantly inattentive type: Secondary | ICD-10-CM | POA: Diagnosis not present

## 2022-08-07 DIAGNOSIS — Z6379 Other stressful life events affecting family and household: Secondary | ICD-10-CM | POA: Diagnosis not present

## 2022-08-07 DIAGNOSIS — F411 Generalized anxiety disorder: Secondary | ICD-10-CM | POA: Diagnosis not present

## 2022-08-07 MED ORDER — COTEMPLA XR-ODT 17.3 MG PO TBED
1.0000 | EXTENDED_RELEASE_TABLET | Freq: Every morning | ORAL | 0 refills | Status: DC
Start: 2022-08-07 — End: 2022-08-11

## 2022-08-07 NOTE — Patient Instructions (Signed)
[facility] [address], [address] [place], [place] [address] Phone: [phone]

## 2022-08-07 NOTE — Progress Notes (Signed)
Heather Davila [id number] 06-17-1960 62 y.o.  Subjective:   Patient ID:  Heather Davila is a 62 y.o. (DOB December 22, 1960) female.  Chief Complaint:  Chief Complaint  Patient presents with   Follow-up   Depression   Anxiety    HPI Heather Davila presents to the office today for follow-up of treatment resistant recurrent depression, generalized anxiety disorder and insomnia.  Almost too numerous to count med failures. She is here for Spravato administration for her treatment resistant depression.  05/21/22 appt noted: Patient was administered first dose Spravato 56 mg intranasally today.  The patient experienced the typical dissociation which gradually resolved over the 2-hour period of observation.  There were no complications.  Specifically the patient did not have nausea or vomiting or headache.   Dissociation was mild. Did not experience any emotional relief from disabling depression.wants to increase Spravato to the usual dose.  She is aware insurance is not covering the costs at this time.  No SI acutely. Tolerating meds.    05/23/22 appt noted: Patient was administered Spravato 84 mg intranasally today.  The patient experienced the typical dissociation which gradually resolved over the 2-hour period of observation.  There were no complications.  Specifically the patient did not have nausea or vomiting.   Dissociation was greater than last dose and a little bothersome DT some HA. Tolerating meds.   Mood has not changed significantly thus far with Spravato.  05/27/22 appt noted: Patient was administered Spravato 84 mg intranasally today.  The patient experienced the typical dissociation which gradually resolved over the 2-hour period of observation.  There were no complications.  Specifically the patient did not have nausea or vomiting or headache. Today she has felt a little relief from the pressing heaviness of depression but a little more anxiety.  During administration of Spravato she  listens to [place] and was able to relax a little more with the feeling of dissociation that was mildly bothersome. Husband was also in on the session today and asked some questions about IV ketamine versus nasal spray ketamine.  05/29/22 appt noted: Cancelled today DT hypertension  05/30/22 appt noted: Patient was administered Spravato 84 mg intranasally today.  The patient experienced the typical dissociation which gradually resolved over the 2-hour period of observation.  There were no complications.  Specifically the patient did not have nausea or vomiting or headache. She is seeing some improvement in mood with less continuous depression and less intensity.  Hopefulness is better.   No med complaints or SE  06/05/22 appt noted: Patient was administered Spravato 84 mg intranasally today.  The patient experienced the typical dissociation which gradually resolved over the 2-hour period of observation.  There were no complications.  Specifically the patient did not have nausea or vomiting or headache.  Specifically HA is less of a problem with Spravato. No SE with meds. Depression is improving with less intensity.  Intermittent anxiety easily.  More able to enjoy things.  No new concerns.  06/17/22 appt noted: Patient was administered Spravato 84 mg intranasally today.  The patient experienced the typical dissociation which gradually resolved over the 2-hour period of observation.  There were no complications.  Specifically the patient did not have nausea or vomiting or headache.  Specifically HA is less of a problem with Spravato. No SE with meds. Depression is improved 45% with less intensity.  Intermittent anxiety less easily.  More able to enjoy things.  No new concerns.  More active. Current meds: increased clonazepam to about  1.5 mg HS DT recent insomnia, lexapro 20, Dayvigo 10 mg HS, concerta 36 mg AM, trazodone 100 mg HS.  06/20/22 appt noted: Current meds: increased clonazepam to  about 1.5 mg HS DT recent insomnia, lexapro 20, Dayvigo 10 mg HS, concerta 36 mg AM, trazodone 100 mg HS. Patient was administered Spravato 84 mg intranasally today.  The patient experienced the typical dissociation which gradually resolved over the 2-hour period of observation.  There were no complications.  Specifically the patient did not have nausea or vomiting or headache.  Specifically HA is less of a problem with Spravato. No SE with meds. Depression is improved 45% with less intensity.  Intermittent anxiety less easily.  More able to enjoy things.  No new concerns.  More active.  06/23/22 appt noted:  Current meds: increased clonazepam to about 1.5 mg HS DT recent insomnia, lexapro 20, Dayvigo 10 mg HS, concerta 36 mg AM, trazodone 100 mg HS. Patient was administered Spravato 84 mg intranasally today.  The patient experienced the typical dissociation which gradually resolved over the 2-hour period of observation.  There were no complications.  Specifically the patient did not have nausea or vomiting or headache.  Specifically HA is less of a problem with Spravato. No SE with meds. Intensity of Spravato dissociation varies.  Last week very intesne and this week more typical.  Depression is continuing to improve with better interest and activity and motivation.  Gradual improvement.  Wants to continue.  06/25/22 appt noted: Current meds: increased clonazepam to about 1.5 mg HS DT recent insomnia, lexapro 20, Dayvigo 10 mg HS, concerta 36 mg AM, trazodone 100 mg HS. Patient was administered Spravato 84 mg intranasally today.  The patient experienced the typical dissociation which gradually resolved over the 2-hour period of observation.  There were no complications.  Specifically the patient did not have nausea or vomiting or headache.  Specifically HA is less of a problem with Spravato. No SE with meds. Intensity of Spravato dissociation varies.  No scary and well tolerated. Continues to  gradually improve with Spravato.  She is more motivated and enjoying things more.  H sees progress.  Wants to continue twice weekly until gets maximum response.  Dep is not gone yet.  06/30/22 appt noted:  seen with H Current meds: increased clonazepam to about 1.5 mg HS DT recent insomnia, lexapro 20, Dayvigo 10 mg HS, concerta 36 mg AM, trazodone 100 mg HS. Patient was administered Spravato 84 mg intranasally today.  The patient experienced the typical dissociation which gradually resolved over the 2-hour period of observation.  There were no complications.  Specifically the patient did not have nausea or vomiting or headache.  Specifically HA is less of a problem with Spravato. No SE with meds. Intensity of Spravato dissociation varies.  No scary and well tolerated. Continues to gradually improve with Spravato.  She is more motivated and enjoying things more.  H sees even more progress than she does; more active and smiling.  Wants to continue twice weekly until gets maximum response.  Dep is 80% better.  07/07/22 appt noted: Current meds: increased clonazepam to about 1.5 mg HS DT recent insomnia, lexapro 20, Dayvigo 10 mg HS, concerta 36 mg AM, trazodone 100 mg HS. Patient was administered Spravato 84 mg intranasally today.  The patient experienced the typical dissociation which gradually resolved over the 2-hour period of observation.  There were no complications.  Specifically the patient did not have nausea or vomiting or headache.  Specifically  HA is less of a problem with Spravato. No SE with meds. Intensity of Spravato dissociation varies.  No scary and well tolerated.  Was more intese today. Overall mood is markedly better and please with meds. No changes desired.  07/09/22 appt noted: In transition from Lexapro to sertraline and missing Concerta bc CO crash from it after about 4 hours.  Disc these concerns.  She'll discuss also with Heather Headings, NP More dep the last couple of days and  concerned about reducing Spravato frequency while transition from one antidep to another.  Affect less positive. Patient was administered Spravato 84 mg intranasally today.  The patient experienced the typical dissociation which gradually resolved over the 2-hour period of observation.  There were no complications.  Specifically the patient did not have nausea or vomiting or headache.  Specifically HA is less of a problem with Spravato. No SE with meds. Intensity of Spravato dissociation varies.  No scary and well tolerated.   07/14/22 appt noted: Has been more depressed this week.  More trouble with sleep.  Taking clonazepam 1-1.5  of the 0.5 mg tablets.   Trazodone not helping much. She switched back from 200 sertraline to Lexapro 20.  No SE More trouble with motivation.  Some stress with son with special needs. Misses the benevit of Concerta but it was too short acting and crashed in 4-6 hours. Patient was administered Spravato 84 mg intranasally today.  The patient experienced the typical dissociation which gradually resolved over the 2-hour period of observation.  There were no complications.  Specifically the patient did not have nausea or vomiting or headache.  Specifically HA is less of a problem with Spravato. No SE with meds. Intensity of Spravato dissociation varies.  No scary and well tolerated.   07/17/22 appt noted: Patient was administered Spravato 84 mg intranasally today.  The patient experienced the typical dissociation which gradually resolved over the 2-hour period of observation.  There were no complications.  Specifically the patient did not have nausea or vomiting or headache.  Specifically HA is less of a problem with Spravato. No SE with meds. Intensity of Spravato dissociation varies.  Not scary and well tolerated.  Just got Jornay last night but wasn't sure how to take it.  Wants to start and this was discussed.  She felt MPH helped mood and attn but didn't last long enough  and had crash after a few hours on Concerta.   07/22/22 appt noted: Patient was administered Spravato 84 mg intranasally today.  The patient experienced the typical dissociation which gradually resolved over the 2-hour period of observation.  There were no complications.  Specifically the patient did not have nausea or vomiting or headache.  Specifically HA is less of a problem with Spravato. No SE with meds. Intensity of Spravato dissociation varies.  Not scary and well tolerated.  Start Jornay 20 mg over the weekend and did not notice any particular effect good or bad.  Increased to 40 mg. Other psych meds: Clonazepam 0.5 mg tablets 2 nightly, gabapentin dosage varies, Dayvigo 10 mg nightly, Lexapro 20 mg daily, to trazodone 100 mg nightly  07/24/22 appt noted: Patient was administered Spravato 84 mg intranasally today.  The patient experienced the typical dissociation which gradually resolved over the 2-hour period of observation.  There were no complications.  Specifically the patient did not have nausea or vomiting or headache.  Specifically HA is less of a problem with Spravato. No SE with meds. Intensity of Spravato dissociation  varies.  Not scary and well tolerated.  Other psych meds: Clonazepam 0.5 mg tablets 2 nightly, gabapentin dosage varies, Dayvigo 10 mg nightly, Lexapro 20 mg daily, to trazodone 100 mg nightly, Jornay 40 mg PM Doing well with Spravato.  Noticed a little effect from the Jornay 40 without SE but would like to increase the dose for ADD and mood.  Sleeping well.  No new concerns.  Still more depressed than she was with th initial benefit of Spravato.  07/30/22 note:  Patient was administered Spravato 84 mg intranasally today.  The patient experienced the typical dissociation which gradually resolved over the 2-hour period of observation.  There were no complications.  Specifically the patient did not have nausea or vomiting or headache.  Specifically HA is less of a problem  with Spravato. Other psych meds: Clonazepam 0.5 mg tablets 2 nightly, gabapentin dosage varies, Dayvigo 10 mg nightly, Lexapro 20 mg daily, to trazodone 100 mg nightly, Jornay 60 mg PM Wants to increase Jornay to 80 mg PM to increase benefit for ADD and depression. No SE She has experienced a major family crisis that threatens to change her daily life from now on.  It has not triggered suicidal thoughts at this time but she is very afraid.  No desire to change medications.  08/04/22 appt noted" Patient was administered Spravato 84 mg intranasally today.  The patient experienced the typical dissociation which gradually resolved over the 2-hour period of observation.  There were no complications.  Specifically the patient did not have nausea or vomiting or headache.  Specifically HA is less of a problem with Spravato. Other psych meds: Clonazepam 0.5 mg tablets 2 nightly, gabapentin dosage varies, Dayvigo 10 mg nightly, Lexapro 20 mg daily, trazodone 100 mg nightly, Jornay 80 mg PM No SE No benefit or SE with increase Jormay 80 mg.  Says Concerta helped ADD and depression but not Jornay.  However duration was insufficient.  Still depressed and wants change to another form of stimulant.  No concerns with other meds. Continues with strong family stressors creating uncertainty about her future but no quick resolution. No SI  08/07/22 appt noted: Patient was administered Spravato 84 mg intranasally today.  The patient experienced the typical dissociation which gradually resolved over the 2-hour period of observation.  There were no complications.  Specifically the patient did not have nausea or vomiting or headache.  Specifically HA is less of a problem with Spravato. Other psych meds: Clonazepam 0.5 mg tablets 2 nightly, gabapentin dosage varies, Dayvigo 10 mg nightly, sertraline 150 mg daily, trazodone 100 mg nightly, beginning trial of Cotempla 17.3 No SE Still sig depression with anxiety and a lot of  stress.  Looking for more mood benefit MPH bc helps mood briefly but wears off.  Wants longer duration.   Sleep ok. Switched back to sertraline from Lexapro.  Better for 2 hours after each dose Ritalin 20 mg with mood but then it wore off.  BP up after 3rd dose 160/90 and then took H's amlodipine 5 and BP became normal.    Past Psychiatric Medication Trials: Trintellix - Initially effective and then was not effective when re-started Paxil- Ineffective. Helped initially. Prozac-Insomnia Lexapro- Effective and then no longer as effective Zoloft- Initially effective and then not as effective.  Viibryd Cymbalta- Ineffective. Helped initially. Effexor XR- Effective, well tolerated. Pristiq- Increased anxiety at 150 mg daily Wellbutrin XL-Ineffective.May have increased anxiety. Amitriptyline Nortriptyline-Caused irritability, constipation Auvelity- ineffective Abilify- Helpful but caused severe insomnia. Rexulti-Helpful  but caused insomnia. Vraylar Seroquel - Ineffective Risperdal Olanzapine- Ineffective Latuda- Ineffective Caplyta Klonopin- Effective Temazepam- Took during menopause Lunesta- Ineffective Ambien-Ineffective Sonata- Ineffective Dayvigo- partially effective Trazodone- Ineffective Remeron-Ineffective. Does not recall wt gain.  Adderall- Took once and had adverse effect. Concerta- Effective but only 4-6 hours Metadate-Not as effective as Concerta Dexmethylphenidate- Not as effective as Concerta Azstarys- some side effects. Not as effective.  Lamictal- May have had an adverse effects. "Felt weird." Reports worsening s/s.  Lithium Gabapentin Pramipexole- "felt really weird."   ECT #20 with minimal response. H says it was partly helpful.  AIMS    Flowsheet Row Video Visit from 01/03/2022 in [facility]  AIMS Total Score 0      ECT-MADRS    Flowsheet Row Video Visit from 04/29/2022 in [facility] Video  Visit from 02/06/2022 in [facility]  MADRS Total Score 44 23      PHQ2-9    Flowsheet Row Video Visit from 02/06/2022 in [facility] Office Visit from 11/16/2020 in [facility] Office Visit from 04/19/2018 in [facility] Total Score 6 0 0  PHQ-9 Total Score 14 -- 2        Review of Systems:  Review of Systems  Constitutional:  Positive for fatigue.  Cardiovascular:  Negative for chest pain.  Neurological:  Negative for tremors.  Psychiatric/Behavioral:  Positive for dysphoric mood. Negative for agitation and decreased concentration. The patient is nervous/anxious.     Medications: I have reviewed the patient's current medications.  Current Outpatient Medications  Medication Sig Dispense Refill   clonazePAM (KLONOPIN) 0.5 MG tablet 2 tablets at night and 1 tablet as needed daily for panic anxiety 75 tablet 1   doxycycline (VIBRAMYCIN) 50 MG capsule Take 50 mg by mouth daily.     Esketamine HCl, 84 MG Dose, (SPRAVATO, 84 MG DOSE,) 28 MG/DEVICE SOPK USE 3 SPRAYS IN EACH NOSTRIL EVERY 3 DAYS 3 each 2   estradiol (ESTRACE) 0.1 MG/GM vaginal cream USE 1 GM VAGINALLY TWICE A WEEK AT BEDTIME 42.5 g 0   gabapentin (NEURONTIN) 800 MG tablet Take 1 tablet (800 mg total) by mouth at bedtime. 90 tablet 1   Lemborexant (DAYVIGO) 10 MG TABS Take 1/2-1 tab po QHS 30 tablet 2   MAGNESIUM PO Take by mouth.     Methylphenidate (COTEMPLA XR-ODT) 17.3 MG TBED Take 1 tablet by mouth every morning.     Multiple Vitamin (MULTIVITAMIN) tablet Take 1 tablet by mouth daily.     sertraline (ZOLOFT) 100 MG tablet Take 150 mg by mouth daily.     traZODone (DESYREL) 100 MG tablet Take 1 tablet (100 mg total) by mouth at bedtime. Take 1-2 tabs po QHS prn 180 tablet 1   valACYclovir (VALTREX) 500 MG tablet Take one twice daily for 3 days with any symptoms 30 tablet  3   gabapentin (NEURONTIN) 100 MG capsule Take 1 capsule (100 mg total) by mouth 3 (three) times daily as needed. Take 1 capsule three times daily 90 capsule 1   methylphenidate (DAYTRANA) 30 MG/9HR Place 1 patch onto the skin daily. wear patch for 9 hours only each day (Patient not taking: Reported on 08/11/2022) 30 patch 0   No current facility-administered medications for this visit.    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   TRANSAMINASES, SERUM, ELEVATED 08/14/2009    Past Medical History, Surgical history, Social history, and Family history were reviewed and updated as appropriate.   Please see review of systems for further details on the patient's review from today.   Objective:   Physical Exam:  LMP 12/27/2007 (Exact Date)   Physical Exam Constitutional:      General: She is not in acute distress. Musculoskeletal:        General: No deformity.  Neurological:     Mental Status: She is alert and oriented to person, place, and time.     Sensory: Sensory deficit present.     Coordination: Coordination normal.  Psychiatric:        Attention and Perception: Attention and perception normal. She does not perceive auditory or visual hallucinations.        Mood and Affect: Mood is anxious and depressed. Affect is blunt. Affect is not labile.        Speech: Speech normal.        Behavior: Behavior normal.        Thought Content: Thought content normal. Thought content is not delusional. Thought content does not include homicidal or suicidal ideation. Thought content does not include suicidal plan.         Cognition and Memory: Cognition and memory normal.        Judgment: Judgment normal.     Comments: Insight intact More down with stress and worried.     Lab Review:     Component Value Date/Time   NA 138 04/22/2022 0932   K 4.1 04/22/2022 0932   CL 98 04/22/2022 0932   CO2 31 04/22/2022 0932   GLUCOSE 102 (H) 04/22/2022 0932   BUN 10 04/22/2022 0932   CREATININE 0.71 04/22/2022 0932   CREATININE 0.68 01/26/2020 0920   CALCIUM 10.5 04/22/2022 0932   PROT 8.3 04/22/2022 0932   ALBUMIN 5.3 (H) 04/22/2022 0932   AST 82 (H) 04/22/2022 0932   ALT 118 (H) 04/22/2022 0932   ALKPHOS 74 04/22/2022 0932   BILITOT 0.3 04/22/2022 0932   GFRNONAA >90 05/01/2014 1645   GFRAA >90 05/01/2014 1645       Component Value Date/Time   WBC 3.3 (L) 04/22/2022 0932   RBC 4.25 04/22/2022 0932   HGB 14.3 04/22/2022 0932   HGB 14.1 10/26/2014 1034   HCT 40.7 04/22/2022 0932   PLT 245.0 04/22/2022 0932   MCV 95.7 04/22/2022 0932   MCH 34.4 (H) 01/26/2020 0920   MCHC 35.1 04/22/2022 0932   RDW 13.2 04/22/2022 0932   LYMPHSABS 0.9 04/22/2022 0932   MONOABS 0.2 04/22/2022 0932   EOSABS 0.1 04/22/2022 0932   BASOSABS 0.0 04/22/2022 0932    No results found for: "POCLITH", "LITHIUM"   No results found for: "PHENYTOIN", "PHENOBARB", "VALPROATE", "CBMZ"   .res Assessment: Plan:    Heather Davila was seen today for follow-up, depression and anxiety.  Diagnoses and all orders for this visit:  Recurrent major depression resistant to treatment -     Discontinue: Methylphenidate (COTEMPLA XR-ODT) 17.3 MG TBED; Take 1  tablet (17.3 mg total) by mouth every morning.  Attention deficit hyperactivity disorder (ADHD), predominantly inattentive type -     Discontinue: Methylphenidate (COTEMPLA XR-ODT) 17.3 MG TBED; Take 1 tablet (17.3 mg total) by mouth every morning.  Stressful life event affecting family  Generalized anxiety disorder  Insomnia, unspecified type    Pt seen for TRD and  administration of Spravato.  Patient was administered Spravato 84 mg intranasally today.  The patient experienced the typical dissociation which gradually resolved over the 2-hour period of observation.  There were no complications.  Specifically the patient did not have nausea or vomiting. But mild headache.  Blood pressures remained within normal ranges at the 40-minute and 2-hour follow-up intervals.  By the time the 2-hour observation period was met the patient was alert and oriented and able to exit without assistance.  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  See nursing note for further details.Tolerated the Spravato to 84mg  . Plan to continue twice daily to max response.  Regarding med options.  She has obviously been thoroughly tried on numerous medications.  The only antidepressant category that she has not taken is MAO inhibitors which are not compatible with Spravato.  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 Disc this with her previously.    We discussed the short-term risks associated with benzodiazepines including sedation and increased fall risk among others.  Discussed long-term side effect risk including dependence, potential withdrawal symptoms, and the potential eventual dose-related risk of dementia.  But recent studies from 2020 dispute this association between benzodiazepines and dementia risk. Newer studies in 2020 do not support an association with dementia.  Less intense depression with Spravato but some relapse ? Re: stopping Concerta bc of the crash.    Tolerating meds.  Satisfied she is making progress Continue sertraline trial at 150 mg daily   for insomnia, clonazepam 1 mg HS. trazodone doesn't work well.  Consider other long acting MPH  like Cotempla,  Daytrana bc she gets mood benefit with MPH and concerta is too short acting but no sig response with [place].   DC Ritalin DT SE Cotempla middle dose.  Should be more affordable than Daytrana but Daytrana likely more effective duration.  Crisis intervention around the events that have occurred in her family that may change her life from this point forward.  She is managing with expected emotional distress but not suicidal thought.  She does not desire any further medication changes except noted.  Consider again the possibility of retrying the sertraline as it does sometimes have a better antianxiety effect than does Lexapro.  FU twice weekly Spravato until improvement maxes out then drop back to weekly.  She wants to   [facility] Staggersarey Cottle, MD, DFAPA    Please see After Visit Summary for patient specific instructions.  Future Appointments  Date Time Provider Department Center  08/13/2022  1:00 PM Cottle, Steva Readyarey G Jr., MD [facility] None  08/13/2022  1:00 PM CP-NURSE [facility] None  08/19/2022  1:55 PM Jerene BearsMiller, Mary S, MD [facility]  08/20/2022  8:00 AM CP-NURSE [facility] None  08/20/2022  9:00 AM Cottle, Steva Readyarey G Jr., MD [facility] None    No orders of the defined types were placed in this encounter.   ------------------------------- T

## 2022-08-08 ENCOUNTER — Telehealth: Payer: Self-pay | Admitting: Psychiatry

## 2022-08-08 ENCOUNTER — Other Ambulatory Visit: Payer: Self-pay | Admitting: Psychiatry

## 2022-08-08 DIAGNOSIS — F339 Major depressive disorder, recurrent, unspecified: Secondary | ICD-10-CM

## 2022-08-08 NOTE — Telephone Encounter (Signed)
Pt called at 3:48p.  She is asking Beth to have Dr Jennelle Human try to get approval for the ADHD patch (she didn't know the medicine name) they discussed.  She said the current medicine is too short-lived.

## 2022-08-08 NOTE — Telephone Encounter (Signed)
This will be discussed on Monday at her apt

## 2022-08-11 ENCOUNTER — Ambulatory Visit (INDEPENDENT_AMBULATORY_CARE_PROVIDER_SITE_OTHER): Payer: 59 | Admitting: Psychiatry

## 2022-08-11 ENCOUNTER — Encounter: Payer: Self-pay | Admitting: Psychiatry

## 2022-08-11 ENCOUNTER — Ambulatory Visit: Payer: 59

## 2022-08-11 VITALS — BP 126/75 | HR 70

## 2022-08-11 DIAGNOSIS — F339 Major depressive disorder, recurrent, unspecified: Secondary | ICD-10-CM

## 2022-08-11 DIAGNOSIS — Z6379 Other stressful life events affecting family and household: Secondary | ICD-10-CM | POA: Diagnosis not present

## 2022-08-11 DIAGNOSIS — F900 Attention-deficit hyperactivity disorder, predominantly inattentive type: Secondary | ICD-10-CM

## 2022-08-11 DIAGNOSIS — F419 Anxiety disorder, unspecified: Secondary | ICD-10-CM | POA: Diagnosis not present

## 2022-08-11 DIAGNOSIS — F411 Generalized anxiety disorder: Secondary | ICD-10-CM

## 2022-08-11 DIAGNOSIS — G4700 Insomnia, unspecified: Secondary | ICD-10-CM

## 2022-08-11 MED ORDER — TRAZODONE HCL 100 MG PO TABS
100.0000 mg | ORAL_TABLET | Freq: Every day | ORAL | 1 refills | Status: DC
Start: 2022-08-11 — End: 2022-08-13

## 2022-08-11 MED ORDER — DAYVIGO 10 MG PO TABS
ORAL_TABLET | ORAL | 2 refills | Status: DC
Start: 2022-08-11 — End: 2022-08-13

## 2022-08-11 MED ORDER — METHYLPHENIDATE 30 MG/9HR TD PTCH
1.0000 | MEDICATED_PATCH | Freq: Every day | TRANSDERMAL | 0 refills | Status: DC
Start: 2022-08-11 — End: 2022-08-13

## 2022-08-11 NOTE — Progress Notes (Signed)
Heather Davila [id number] 05-02-1960 62 y.o.  Subjective:   Patient ID:  Heather Davila is a 62 y.o. (DOB [birth year]) female.  Chief Complaint:  Chief Complaint  Patient presents with   Follow-up   Depression   Stress   ADD   Anxiety    HPI [name] presents to the office today for follow-up of treatment resistant recurrent depression, generalized anxiety disorder and insomnia.  Almost too numerous to count med failures. She is here for Spravato administration for her treatment resistant depression.  05/21/22 appt noted: Patient was administered first dose Spravato 56 mg intranasally today.  The patient experienced the typical dissociation which gradually resolved over the 2-hour period of observation.  There were no complications.  Specifically the patient did not have nausea or vomiting or headache.   Dissociation was mild. Did not experience any emotional relief from disabling depression.wants to increase Spravato to the usual dose.  She is aware insurance is not covering the costs at this time.  No SI acutely. Tolerating meds.    05/23/22 appt noted: Patient was administered Spravato 84 mg intranasally today.  The patient experienced the typical dissociation which gradually resolved over the 2-hour period of observation.  There were no complications.  Specifically the patient did not have nausea or vomiting.   Dissociation was greater than last dose and a little bothersome DT some HA. Tolerating meds.   Mood has not changed significantly thus far with Spravato.  05/27/22 appt noted: Patient was administered Spravato 84 mg intranasally today.  The patient experienced the typical dissociation which gradually resolved over the 2-hour period of observation.  There were no complications.  Specifically the patient did not have nausea or vomiting or headache. Today she has felt a little relief from the pressing heaviness of depression but a little more anxiety.  During administration  of Spravato she listens to [place] music and was able to relax a little more with the feeling of dissociation that was mildly bothersome. Husband was also in on the session today and asked some questions about IV ketamine versus nasal spray ketamine.  05/29/22 appt noted: Cancelled today DT hypertension  05/30/22 appt noted: Patient was administered Spravato 84 mg intranasally today.  The patient experienced the typical dissociation which gradually resolved over the 2-hour period of observation.  There were no complications.  Specifically the patient did not have nausea or vomiting or headache. She is seeing some improvement in mood with less continuous depression and less intensity.  Hopefulness is better.   No med complaints or SE  06/05/22 appt noted: Patient was administered Spravato 84 mg intranasally today.  The patient experienced the typical dissociation which gradually resolved over the 2-hour period of observation.  There were no complications.  Specifically the patient did not have nausea or vomiting or headache.  Specifically HA is less of a problem with Spravato. No SE with meds. Depression is improving with less intensity.  Intermittent anxiety easily.  More able to enjoy things.  No new concerns.  06/17/22 appt noted: Patient was administered Spravato 84 mg intranasally today.  The patient experienced the typical dissociation which gradually resolved over the 2-hour period of observation.  There were no complications.  Specifically the patient did not have nausea or vomiting or headache.  Specifically HA is less of a problem with Spravato. No SE with meds. Depression is improved 45% with less intensity.  Intermittent anxiety less easily.  More able to enjoy things.  No new concerns.  More active.  Current meds: increased clonazepam to about 1.5 mg HS DT recent insomnia, lexapro 20, Dayvigo 10 mg HS, concerta 36 mg AM, trazodone 100 mg HS.  06/20/22 appt noted: Current meds: increased  clonazepam to about 1.5 mg HS DT recent insomnia, lexapro 20, Dayvigo 10 mg HS, concerta 36 mg AM, trazodone 100 mg HS. Patient was administered Spravato 84 mg intranasally today.  The patient experienced the typical dissociation which gradually resolved over the 2-hour period of observation.  There were no complications.  Specifically the patient did not have nausea or vomiting or headache.  Specifically HA is less of a problem with Spravato. No SE with meds. Depression is improved 45% with less intensity.  Intermittent anxiety less easily.  More able to enjoy things.  No new concerns.  More active.  06/23/22 appt noted:  Current meds: increased clonazepam to about 1.5 mg HS DT recent insomnia, lexapro 20, Dayvigo 10 mg HS, concerta 36 mg AM, trazodone 100 mg HS. Patient was administered Spravato 84 mg intranasally today.  The patient experienced the typical dissociation which gradually resolved over the 2-hour period of observation.  There were no complications.  Specifically the patient did not have nausea or vomiting or headache.  Specifically HA is less of a problem with Spravato. No SE with meds. Intensity of Spravato dissociation varies.  Last week very intesne and this week more typical.  Depression is continuing to improve with better interest and activity and motivation.  Gradual improvement.  Wants to continue.  06/25/22 appt noted: Current meds: increased clonazepam to about 1.5 mg HS DT recent insomnia, lexapro 20, Dayvigo 10 mg HS, concerta 36 mg AM, trazodone 100 mg HS. Patient was administered Spravato 84 mg intranasally today.  The patient experienced the typical dissociation which gradually resolved over the 2-hour period of observation.  There were no complications.  Specifically the patient did not have nausea or vomiting or headache.  Specifically HA is less of a problem with Spravato. No SE with meds. Intensity of Spravato dissociation varies.  No scary and well  tolerated. Continues to gradually improve with Spravato.  She is more motivated and enjoying things more.  H sees progress.  Wants to continue twice weekly until gets maximum response.  Dep is not gone yet.  06/30/22 appt noted:  seen with H Current meds: increased clonazepam to about 1.5 mg HS DT recent insomnia, lexapro 20, Dayvigo 10 mg HS, concerta 36 mg AM, trazodone 100 mg HS. Patient was administered Spravato 84 mg intranasally today.  The patient experienced the typical dissociation which gradually resolved over the 2-hour period of observation.  There were no complications.  Specifically the patient did not have nausea or vomiting or headache.  Specifically HA is less of a problem with Spravato. No SE with meds. Intensity of Spravato dissociation varies.  No scary and well tolerated. Continues to gradually improve with Spravato.  She is more motivated and enjoying things more.  H sees even more progress than she does; more active and smiling.  Wants to continue twice weekly until gets maximum response.  Dep is 80% better.  07/07/22 appt noted: Current meds: increased clonazepam to about 1.5 mg HS DT recent insomnia, lexapro 20, Dayvigo 10 mg HS, concerta 36 mg AM, trazodone 100 mg HS. Patient was administered Spravato 84 mg intranasally today.  The patient experienced the typical dissociation which gradually resolved over the 2-hour period of observation.  There were no complications.  Specifically the patient did not have nausea  or vomiting or headache.  Specifically HA is less of a problem with Spravato. No SE with meds. Intensity of Spravato dissociation varies.  No scary and well tolerated.  Was more intese today. Overall mood is markedly better and please with meds. No changes desired.  07/09/22 appt noted: In transition from Lexapro to sertraline and missing Concerta bc CO crash from it after about 4 hours.  Disc these concerns.  She'll discuss also with Corie Chiquito, NP More dep the  last couple of days and concerned about reducing Spravato frequency while transition from one antidep to another.  Affect less positive. Patient was administered Spravato 84 mg intranasally today.  The patient experienced the typical dissociation which gradually resolved over the 2-hour period of observation.  There were no complications.  Specifically the patient did not have nausea or vomiting or headache.  Specifically HA is less of a problem with Spravato. No SE with meds. Intensity of Spravato dissociation varies.  No scary and well tolerated.   07/14/22 appt noted: Has been more depressed this week.  More trouble with sleep.  Taking clonazepam 1-1.5  of the 0.5 mg tablets.   Trazodone not helping much. She switched back from 200 sertraline to Lexapro 20.  No SE More trouble with motivation.  Some stress with son with special needs. Misses the benevit of Concerta but it was too short acting and crashed in 4-6 hours. Patient was administered Spravato 84 mg intranasally today.  The patient experienced the typical dissociation which gradually resolved over the 2-hour period of observation.  There were no complications.  Specifically the patient did not have nausea or vomiting or headache.  Specifically HA is less of a problem with Spravato. No SE with meds. Intensity of Spravato dissociation varies.  No scary and well tolerated.   07/17/22 appt noted: Patient was administered Spravato 84 mg intranasally today.  The patient experienced the typical dissociation which gradually resolved over the 2-hour period of observation.  There were no complications.  Specifically the patient did not have nausea or vomiting or headache.  Specifically HA is less of a problem with Spravato. No SE with meds. Intensity of Spravato dissociation varies.  Not scary and well tolerated.  Just got Jornay last night but wasn't sure how to take it.  Wants to start and this was discussed.  She felt MPH helped mood and attn but  didn't last long enough and had crash after a few hours on Concerta.   07/22/22 appt noted: Patient was administered Spravato 84 mg intranasally today.  The patient experienced the typical dissociation which gradually resolved over the 2-hour period of observation.  There were no complications.  Specifically the patient did not have nausea or vomiting or headache.  Specifically HA is less of a problem with Spravato. No SE with meds. Intensity of Spravato dissociation varies.  Not scary and well tolerated.  Start Jornay 20 mg over the weekend and did not notice any particular effect good or bad.  Increased to 40 mg. Other psych meds: Clonazepam 0.5 mg tablets 2 nightly, gabapentin dosage varies, Dayvigo 10 mg nightly, Lexapro 20 mg daily, to trazodone 100 mg nightly  07/24/22 appt noted: Patient was administered Spravato 84 mg intranasally today.  The patient experienced the typical dissociation which gradually resolved over the 2-hour period of observation.  There were no complications.  Specifically the patient did not have nausea or vomiting or headache.  Specifically HA is less of a problem with Spravato. No SE  with meds. Intensity of Spravato dissociation varies.  Not scary and well tolerated.  Other psych meds: Clonazepam 0.5 mg tablets 2 nightly, gabapentin dosage varies, Dayvigo 10 mg nightly, Lexapro 20 mg daily, to trazodone 100 mg nightly, Jornay 40 mg PM Doing well with Spravato.  Noticed a little effect from the Jornay 40 without SE but would like to increase the dose for ADD and mood.  Sleeping well.  No new concerns.  Still more depressed than she was with th initial benefit of Spravato.  07/30/22 note:  Patient was administered Spravato 84 mg intranasally today.  The patient experienced the typical dissociation which gradually resolved over the 2-hour period of observation.  There were no complications.  Specifically the patient did not have nausea or vomiting or headache.  Specifically  HA is less of a problem with Spravato. Other psych meds: Clonazepam 0.5 mg tablets 2 nightly, gabapentin dosage varies, Dayvigo 10 mg nightly, Lexapro 20 mg daily, to trazodone 100 mg nightly, Jornay 60 mg PM Wants to increase Jornay to 80 mg PM to increase benefit for ADD and depression. No SE She has experienced a major family crisis that threatens to change her daily life from now on.  It has not triggered suicidal thoughts at this time but she is very afraid.  No desire to change medications.  08/04/22 appt noted" Patient was administered Spravato 84 mg intranasally today.  The patient experienced the typical dissociation which gradually resolved over the 2-hour period of observation.  There were no complications.  Specifically the patient did not have nausea or vomiting or headache.  Specifically HA is less of a problem with Spravato. Other psych meds: Clonazepam 0.5 mg tablets 2 nightly, gabapentin dosage varies, Dayvigo 10 mg nightly, Lexapro 20 mg daily, trazodone 100 mg nightly, Jornay 80 mg PM No SE No benefit or SE with increase Jormay 80 mg.  Says Concerta helped ADD and depression but not Jornay.  However duration was insufficient.  Still depressed and wants change to another form of stimulant.  No concerns with other meds. Continues with strong family stressors creating uncertainty about her future but no quick resolution. No SI Trial Cotempla 17.3 and DC Ritalin & Jornay 80  08/11/2022 appointment noted: Patient was administered Spravato 84 mg intranasally today.  The patient experienced the typical dissociation which gradually resolved over the 2-hour period of observation.  There were no complications.  Specifically the patient did not have nausea or vomiting or headache.  Specifically HA is less of a problem with Spravato now vs when started it.. Other psych meds: Clonazepam 0.5 mg tablets 2 nightly, gabapentin dosage varies, Dayvigo 10 mg nightly, trazodone 100 mg nightly, cotempla  17.3, switch from Lexapro 20 to sertaline 15o mg . No noticeable effects sertraline from 4 depression but has seen some antianxiety effects from the sertraline.  No side effects noted.  No side effects with the other medicines.  Still is only getting very brief benefit 3 to 4 hours from Cotempla for ADD and mood.  She wants to try the Daytrana patch as we have discussed before. No SE  Better for 2 hours after each dose Ritalin 20 mg with mood but then it wore off.  BP up after 3rd dose 160/90 and then took H's amlodipine 5 and BP became normal.    Past Psychiatric Medication Trials: Trintellix - Initially effective and then was not effective when re-started Paxil- Ineffective. Helped initially. Prozac-Insomnia Lexapro- Effective and then no longer as effective  Zoloft- Initially effective and then not as effective.  Viibryd Cymbalta- Ineffective. Helped initially. Effexor XR- Effective, well tolerated. Pristiq- Increased anxiety at 150 mg daily Wellbutrin XL-Ineffective.May have increased anxiety. Amitriptyline Nortriptyline-Caused irritability, constipation Auvelity- ineffective Abilify- Helpful but caused severe insomnia. Rexulti-Helpful but caused insomnia. Vraylar Seroquel - Ineffective Risperdal Olanzapine- Ineffective Latuda- Ineffective Caplyta Klonopin- Effective Temazepam- Took during menopause Lunesta- Ineffective Ambien-Ineffective Sonata- Ineffective Dayvigo- partially effective Trazodone- Ineffective Remeron-Ineffective. Does not recall wt gain.   Adderall- Took once and had adverse effect. Concerta- Effective but only 4-6 hours Jornay 80 NR Metadate-Not as effective as Concerta Dexmethylphenidate- Not as effective as Concerta Azstarys- some side effects. Not as effective.  Ritalin short acting and SE anxiety & HTN @20  TID Cotemplay 17.3   Lamictal- May have had an adverse effects. "Felt weird." Reports worsening s/s.  Lithium Gabapentin Pramipexole-  "felt really weird."   ECT #20 with minimal response. H says it was partly helpful.  AIMS    Flowsheet Row Video Visit from 01/03/2022 in [facility]  AIMS Total Score 0      ECT-MADRS    Flowsheet Row Video Visit from 04/29/2022 in [facility] Video Visit from 02/06/2022 in [facility]  MADRS Total Score 44 23      PHQ2-9    Flowsheet Row Video Visit from 02/06/2022 in [facility] Office Visit from 11/16/2020 in [facility] Office Visit from 04/19/2018 in [facility] Total Score 6 0 0  PHQ-9 Total Score 14 -- 2        Review of Systems:  Review of Systems  Constitutional:  Positive for fatigue.  Cardiovascular:  Negative for chest pain and palpitations.  Neurological:  Negative for tremors.  Psychiatric/Behavioral:  Positive for dysphoric mood. Negative for decreased concentration. The patient is nervous/anxious.     Medications: I have reviewed the patient's current medications.  Current Outpatient Medications  Medication Sig Dispense Refill   clonazePAM (KLONOPIN) 0.5 MG tablet 2 tablets at night and 1 tablet as needed daily for panic anxiety 75 tablet 1   doxycycline (VIBRAMYCIN) 50 MG capsule Take 50 mg by mouth daily.     Esketamine HCl, 84 MG Dose, (SPRAVATO, 84 MG DOSE,) 28 MG/DEVICE SOPK USE 3 SPRAYS IN EACH NOSTRIL EVERY 3 DAYS 3 each 2   estradiol (ESTRACE) 0.1 MG/GM vaginal cream USE 1 GM VAGINALLY TWICE A WEEK AT BEDTIME 42.5 g 0   gabapentin (NEURONTIN) 800 MG tablet Take 1 tablet (800 mg total) by mouth at bedtime. 90 tablet 1   MAGNESIUM PO Take by mouth.     methylphenidate (DAYTRANA) 30 MG/9HR Place 1 patch onto the skin daily. wear patch for 9 hours only each day 30 patch 0   Multiple Vitamin (MULTIVITAMIN) tablet Take 1 tablet by mouth daily.      sertraline (ZOLOFT) 100 MG tablet Take 150 mg by mouth daily.     valACYclovir (VALTREX) 500 MG tablet Take one twice daily for 3 days with any symptoms 30 tablet 3   gabapentin (NEURONTIN) 100 MG capsule Take 1 capsule (100 mg total) by mouth 3 (three) times daily as needed. Take 1 capsule three times daily 90 capsule 1   Lemborexant (DAYVIGO) 10 MG TABS Take 1/2-1 tab po QHS 30 tablet 2   traZODone (DESYREL) 100 MG tablet Take 1 tablet (100 mg total) by mouth  at bedtime. Take 1-2 tabs po QHS prn 180 tablet 1   No current facility-administered medications for this visit.    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   TRANSAMINASES, SERUM, ELEVATED 08/14/2009    Past Medical History, Surgical history, Social history, and Family history were reviewed and updated as appropriate.   Please see review of systems for further details on the patient's review from today.   Objective:   Physical Exam:  LMP 12/27/2007 (Exact Date)   Physical Exam Constitutional:      General: She is not in acute distress. Musculoskeletal:        General: No deformity.  Neurological:     Mental Status: She is alert and oriented to person, place, and time.     Sensory: Sensory deficit present.     Coordination: Coordination normal.  Psychiatric:        Attention and Perception: Attention and perception normal. She does not perceive auditory hallucinations.        Mood and Affect: Mood is anxious and depressed. Affect is blunt. Affect is not labile.        Speech: Speech normal. Speech is not slurred.        Behavior: Behavior  normal.        Thought Content: Thought content normal. Thought content is not delusional. Thought content does not include homicidal or suicidal ideation. Thought content does not include suicidal plan.        Cognition and Memory: Cognition and memory normal.        Judgment: Judgment normal.     Comments: Insight intact More down with stress and worried.     Lab Review:     Component Value Date/Time   NA 138 04/22/2022 0932   K 4.1 04/22/2022 0932   CL 98 04/22/2022 0932   CO2 31 04/22/2022 0932   GLUCOSE 102 (H) 04/22/2022 0932   BUN 10 04/22/2022 0932   CREATININE 0.71 04/22/2022 0932   CREATININE 0.68 01/26/2020 0920   CALCIUM 10.5 04/22/2022 0932   PROT 8.3 04/22/2022 0932   ALBUMIN 5.3 (H) 04/22/2022 0932   AST 82 (H) 04/22/2022 0932   ALT 118 (H) 04/22/2022 0932   ALKPHOS 74 04/22/2022 0932   BILITOT 0.3 04/22/2022 0932   GFRNONAA >90 05/01/2014 1645   GFRAA >90 05/01/2014 1645       Component Value Date/Time   WBC 3.3 (L) 04/22/2022 0932   RBC 4.25 04/22/2022 0932   HGB 14.3 04/22/2022 0932   HGB 14.1 10/26/2014 1034   HCT 40.7 04/22/2022 0932   PLT 245.0 04/22/2022 0932   MCV 95.7 04/22/2022 0932   MCH 34.4 (H) 01/26/2020 0920   MCHC 35.1 04/22/2022 0932   RDW 13.2 04/22/2022 0932   LYMPHSABS 0.9 04/22/2022 0932   MONOABS 0.2 04/22/2022 0932   EOSABS 0.1 04/22/2022 0932   BASOSABS 0.0 04/22/2022 0932    No results found for: "POCLITH", "LITHIUM"   No results found for: "PHENYTOIN", "PHENOBARB", "VALPROATE", "CBMZ"   .res  Assessment: Plan:    Nikola was seen today for follow-up, depression, stress, add and anxiety.  Diagnoses and all orders for this visit:  Recurrent major depression resistant to treatment  Anxiety disorder, unspecified type  Attention deficit hyperactivity disorder (ADHD), predominantly inattentive type -     methylphenidate (DAYTRANA) 30 MG/9HR; Place 1 patch onto the skin daily. wear patch for 9 hours only each  day  Stressful life event affecting family  Generalized anxiety disorder  Insomnia, unspecified type -     traZODone (DESYREL) 100 MG tablet; Take 1 tablet (100 mg total) by mouth at bedtime. Take 1-2 tabs po QHS prn -     Lemborexant (DAYVIGO) 10 MG TABS; Take 1/2-1 tab po QHS    Pt seen for TRD and administration of Spravato.  Patient was administered Spravato 84 mg intranasally today.  The patient experienced the typical dissociation which gradually resolved over the 2-hour period of observation.  There were no complications.  Specifically the patient did not have nausea or vomiting. But mild headache.  Blood pressures remained within normal ranges at the 40-minute and 2-hour follow-up intervals.  By the time the 2-hour observation period was met the patient was alert and oriented and able to exit without assistance.  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  See nursing note for further details.Tolerated the Spravato to 84mg  . Plan to continue twice daily to max response.  Regarding med options.  She has obviously been thoroughly tried on numerous medications.  The only antidepressant category that she has not taken is MAO inhibitors which are not compatible with Spravato.  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 Disc this with her previously.    We discussed the short-term risks associated with benzodiazepines including sedation and increased fall risk among others.  Discussed long-term side effect risk including dependence, potential withdrawal symptoms, and the potential eventual dose-related risk of dementia.  But recent studies from 2020 dispute this association between benzodiazepines and dementia risk. Newer studies in 2020 do not support an association  with dementia.  Less intense depression with Spravato but some relapse ? Re: stopping Concerta bc of the crash.    Increase sertaline to 200 mg daily.  It has helped anxiety bu tnot depression.    Easiest trial for insomnia, clonazepam 1 mg HS. trazodone doesn't work well.  DC Cotempla and switch to Daytrana 30 mg AM to try to get better duration than po versions have given so far.  Crisis intervention around the events that have occurred in her family that may change her life from this point forward.  She is managing with expected emotional distress but not suicidal thought.  She does not desire any further medication changes except noted.  FU twice weekly Spravato until improvement maxes out then drop back to weekly.  She wants to   Meredith Staggers, MD, DFAPA    Please see After Visit Summary for patient specific instructions.  Future Appointments  Date Time Provider Department Center  08/13/2022  1:00 PM Cottle, Steva Ready., MD [facility] None  08/13/2022  1:00 PM CP-NURSE [facility] None  08/19/2022  1:55 PM Jerene Bears, MD [facility]  08/20/2022  8:00 AM CP-NURSE [facility] None  08/20/2022  9:00 AM Cottle, Steva Ready., MD [facility] None  No orders of the defined types were placed in this encounter.   ------------------------------- T

## 2022-08-12 NOTE — Progress Notes (Signed)
NURSES NOTE:     Patient arrived for her 75 th  Spravato treatment. Pt is being treated for Treatment Resistant Depression, the starting dose is 56 mg (2 of the 28 mg) nasal sprays. Pt will be receiving 84 mg (3 of the 28 mg) nasal sprays, this is her maintenance dose. Patient taken to treatment room, I explained and discussed her treatment and how the schedule should go, along with any side effects that may occur. Answered any questions and concerns the patient had. Pt's Spravato is delivered through [place] and she is now billing with her insurance. Spravato medication is stored at doctors office per REMS/FDA guidelines. The medication is required to be locked behind two doors per FDA/REMS Protocol. Medication is also disposed of properly per regulations. All treatments and her vital signs are documented in Spravato REMS per protocol of treatment center regulations.      Vital Signs assessed at 12:00 PM 137/74, pulse 66, pulse ox 92%. Instructed pt to blow her nose and recline back slightly to prevent any of medication dripping out of her nose.  Pt reports the last stimulant Dr. Jennelle Human gave her is not working. Pt requesting Daytrana Patches, informed her I would let Dr. Jennelle Human know and hopefully there would be no issue of getting it filled. Pt. Given 1st dose (28 mg inhaler), then 5 minutes between 2nd dose and 3rd dose. No complaints of nausea/vomiting reported. Pt did listen to music today, she just reclined back and closed her eyes. After 40 minutes I went to assess her vital signs, no side effects or symptoms reported, B/P 146/81, pulse 64. Informed pt could rest in the chair Dr. Jennelle Human came in to discuss her treatment at the end. She will continue twice a week Spravato treatments. Nurse was with pt a total of 60 minutes but pt observed for 120 minutes per protocol. Discharge vital signs were at 11:45 AM, B/P 119/76, pulse 65. She reports dissociation but she was clear upon her discharge. She scheduled  her next apt  April 17 th. Advised to contact me with any concerns or issues prior to her apt next week.    LOT # [id number] EXP [id number]

## 2022-08-13 ENCOUNTER — Encounter: Payer: Self-pay | Admitting: Psychiatry

## 2022-08-13 ENCOUNTER — Ambulatory Visit (INDEPENDENT_AMBULATORY_CARE_PROVIDER_SITE_OTHER): Payer: 59 | Admitting: Psychiatry

## 2022-08-13 ENCOUNTER — Other Ambulatory Visit: Payer: Self-pay

## 2022-08-13 ENCOUNTER — Ambulatory Visit: Payer: 59

## 2022-08-13 ENCOUNTER — Telehealth: Payer: Self-pay

## 2022-08-13 ENCOUNTER — Encounter: Payer: 59 | Admitting: Psychiatry

## 2022-08-13 VITALS — BP 97/62 | HR 77

## 2022-08-13 DIAGNOSIS — F900 Attention-deficit hyperactivity disorder, predominantly inattentive type: Secondary | ICD-10-CM

## 2022-08-13 DIAGNOSIS — F411 Generalized anxiety disorder: Secondary | ICD-10-CM | POA: Diagnosis not present

## 2022-08-13 DIAGNOSIS — F339 Major depressive disorder, recurrent, unspecified: Secondary | ICD-10-CM

## 2022-08-13 DIAGNOSIS — G4700 Insomnia, unspecified: Secondary | ICD-10-CM

## 2022-08-13 MED ORDER — METHYLPHENIDATE 30 MG/9HR TD PTCH
1.0000 | MEDICATED_PATCH | Freq: Every day | TRANSDERMAL | 0 refills | Status: DC
Start: 2022-08-13 — End: 2022-08-15

## 2022-08-13 MED ORDER — TRAZODONE HCL 100 MG PO TABS
100.0000 mg | ORAL_TABLET | Freq: Every day | ORAL | 1 refills | Status: DC
Start: 2022-08-13 — End: 2022-09-19

## 2022-08-13 MED ORDER — SERTRALINE HCL 100 MG PO TABS
200.0000 mg | ORAL_TABLET | Freq: Every day | ORAL | 0 refills | Status: DC
Start: 2022-08-13 — End: 2022-10-02

## 2022-08-13 MED ORDER — DAYVIGO 10 MG PO TABS: 10.0000 mg | ORAL_TABLET | Freq: Every evening | ORAL | 2 refills | Status: DC

## 2022-08-13 MED ORDER — SERTRALINE HCL 100 MG PO TABS: 200.0000 mg | ORAL_TABLET | Freq: Every day | ORAL | 0 refills | Status: DC

## 2022-08-13 NOTE — Telephone Encounter (Signed)
[facility] called at 4:11p.  She said that the pt has [employer] and that they are not in the pt's network for Daytrana.  She said even the generic is $400.  They think you will want to try to have it filled somewhere else.  Next appt 4/24

## 2022-08-13 NOTE — Progress Notes (Signed)
Heather Davila [id number] 03-14-1961 62 y.o.  Subjective:   Patient ID:  Heather Davila is a 62 y.o. (DOB 1960-07-15) female.  Chief Complaint:  Chief Complaint  Patient presents with   Follow-up   Depression   Anxiety   ADD   Fatigue    HPI Heather Davila presents to the office today for follow-up of treatment resistant recurrent depression, generalized anxiety disorder and insomnia.  Almost too numerous to count med failures. She is here for Spravato administration for her treatment resistant depression.  05/21/22 appt noted: Patient was administered first dose Spravato 56 mg intranasally today.  The patient experienced the typical dissociation which gradually resolved over the 2-hour period of observation.  There were no complications.  Specifically the patient did not have nausea or vomiting or headache.   Dissociation was mild. Did not experience any emotional relief from disabling depression.wants to increase Spravato to the usual dose.  She is aware insurance is not covering the costs at this time.  No SI acutely. Tolerating meds.    05/23/22 appt noted: Patient was administered Spravato 84 mg intranasally today.  The patient experienced the typical dissociation which gradually resolved over the 2-hour period of observation.  There were no complications.  Specifically the patient did not have nausea or vomiting.   Dissociation was greater than last dose and a little bothersome DT some HA. Tolerating meds.   Mood has not changed significantly thus far with Spravato.  05/27/22 appt noted: Patient was administered Spravato 84 mg intranasally today.  The patient experienced the typical dissociation which gradually resolved over the 2-hour period of observation.  There were no complications.  Specifically the patient did not have nausea or vomiting or headache. Today she has felt a little relief from the pressing heaviness of depression but a little more anxiety.  During administration  of Spravato she listens to [place] music and was able to relax a little more with the feeling of dissociation that was mildly bothersome. Husband was also in on the session today and asked some questions about IV ketamine versus nasal spray ketamine.  05/29/22 appt noted: Cancelled today DT hypertension  05/30/22 appt noted: Patient was administered Spravato 84 mg intranasally today.  The patient experienced the typical dissociation which gradually resolved over the 2-hour period of observation.  There were no complications.  Specifically the patient did not have nausea or vomiting or headache. She is seeing some improvement in mood with less continuous depression and less intensity.  Hopefulness is better.   No med complaints or SE  06/05/22 appt noted: Patient was administered Spravato 84 mg intranasally today.  The patient experienced the typical dissociation which gradually resolved over the 2-hour period of observation.  There were no complications.  Specifically the patient did not have nausea or vomiting or headache.  Specifically HA is less of a problem with Spravato. No SE with meds. Depression is improving with less intensity.  Intermittent anxiety easily.  More able to enjoy things.  No new concerns.  06/17/22 appt noted: Patient was administered Spravato 84 mg intranasally today.  The patient experienced the typical dissociation which gradually resolved over the 2-hour period of observation.  There were no complications.  Specifically the patient did not have nausea or vomiting or headache.  Specifically HA is less of a problem with Spravato. No SE with meds. Depression is improved 45% with less intensity.  Intermittent anxiety less easily.  More able to enjoy things.  No new concerns.  More active.  Current meds: increased clonazepam to about 1.5 mg HS DT recent insomnia, lexapro 20, Dayvigo 10 mg HS, concerta 36 mg AM, trazodone 100 mg HS.  06/20/22 appt noted: Current meds: increased  clonazepam to about 1.5 mg HS DT recent insomnia, lexapro 20, Dayvigo 10 mg HS, concerta 36 mg AM, trazodone 100 mg HS. Patient was administered Spravato 84 mg intranasally today.  The patient experienced the typical dissociation which gradually resolved over the 2-hour period of observation.  There were no complications.  Specifically the patient did not have nausea or vomiting or headache.  Specifically HA is less of a problem with Spravato. No SE with meds. Depression is improved 45% with less intensity.  Intermittent anxiety less easily.  More able to enjoy things.  No new concerns.  More active.  06/23/22 appt noted:  Current meds: increased clonazepam to about 1.5 mg HS DT recent insomnia, lexapro 20, Dayvigo 10 mg HS, concerta 36 mg AM, trazodone 100 mg HS. Patient was administered Spravato 84 mg intranasally today.  The patient experienced the typical dissociation which gradually resolved over the 2-hour period of observation.  There were no complications.  Specifically the patient did not have nausea or vomiting or headache.  Specifically HA is less of a problem with Spravato. No SE with meds. Intensity of Spravato dissociation varies.  Last week very intesne and this week more typical.  Depression is continuing to improve with better interest and activity and motivation.  Gradual improvement.  Wants to continue.  06/25/22 appt noted: Current meds: increased clonazepam to about 1.5 mg HS DT recent insomnia, lexapro 20, Dayvigo 10 mg HS, concerta 36 mg AM, trazodone 100 mg HS. Patient was administered Spravato 84 mg intranasally today.  The patient experienced the typical dissociation which gradually resolved over the 2-hour period of observation.  There were no complications.  Specifically the patient did not have nausea or vomiting or headache.  Specifically HA is less of a problem with Spravato. No SE with meds. Intensity of Spravato dissociation varies.  No scary and well  tolerated. Continues to gradually improve with Spravato.  She is more motivated and enjoying things more.  H sees progress.  Wants to continue twice weekly until gets maximum response.  Dep is not gone yet.  06/30/22 appt noted:  seen with H Current meds: increased clonazepam to about 1.5 mg HS DT recent insomnia, lexapro 20, Dayvigo 10 mg HS, concerta 36 mg AM, trazodone 100 mg HS. Patient was administered Spravato 84 mg intranasally today.  The patient experienced the typical dissociation which gradually resolved over the 2-hour period of observation.  There were no complications.  Specifically the patient did not have nausea or vomiting or headache.  Specifically HA is less of a problem with Spravato. No SE with meds. Intensity of Spravato dissociation varies.  No scary and well tolerated. Continues to gradually improve with Spravato.  She is more motivated and enjoying things more.  H sees even more progress than she does; more active and smiling.  Wants to continue twice weekly until gets maximum response.  Dep is 80% better.  07/07/22 appt noted: Current meds: increased clonazepam to about 1.5 mg HS DT recent insomnia, lexapro 20, Dayvigo 10 mg HS, concerta 36 mg AM, trazodone 100 mg HS. Patient was administered Spravato 84 mg intranasally today.  The patient experienced the typical dissociation which gradually resolved over the 2-hour period of observation.  There were no complications.  Specifically the patient did not have nausea  or vomiting or headache.  Specifically HA is less of a problem with Spravato. No SE with meds. Intensity of Spravato dissociation varies.  No scary and well tolerated.  Was more intese today. Overall mood is markedly better and please with meds. No changes desired.  07/09/22 appt noted: In transition from Lexapro to sertraline and missing Concerta bc CO crash from it after about 4 hours.  Disc these concerns.  She'll discuss also with Corie Chiquito, NP More dep the  last couple of days and concerned about reducing Spravato frequency while transition from one antidep to another.  Affect less positive. Patient was administered Spravato 84 mg intranasally today.  The patient experienced the typical dissociation which gradually resolved over the 2-hour period of observation.  There were no complications.  Specifically the patient did not have nausea or vomiting or headache.  Specifically HA is less of a problem with Spravato. No SE with meds. Intensity of Spravato dissociation varies.  No scary and well tolerated.   07/14/22 appt noted: Has been more depressed this week.  More trouble with sleep.  Taking clonazepam 1-1.5  of the 0.5 mg tablets.   Trazodone not helping much. She switched back from 200 sertraline to Lexapro 20.  No SE More trouble with motivation.  Some stress with son with special needs. Misses the benevit of Concerta but it was too short acting and crashed in 4-6 hours. Patient was administered Spravato 84 mg intranasally today.  The patient experienced the typical dissociation which gradually resolved over the 2-hour period of observation.  There were no complications.  Specifically the patient did not have nausea or vomiting or headache.  Specifically HA is less of a problem with Spravato. No SE with meds. Intensity of Spravato dissociation varies.  No scary and well tolerated.   07/17/22 appt noted: Patient was administered Spravato 84 mg intranasally today.  The patient experienced the typical dissociation which gradually resolved over the 2-hour period of observation.  There were no complications.  Specifically the patient did not have nausea or vomiting or headache.  Specifically HA is less of a problem with Spravato. No SE with meds. Intensity of Spravato dissociation varies.  Not scary and well tolerated.  Just got Jornay last night but wasn't sure how to take it.  Wants to start and this was discussed.  She felt MPH helped mood and attn but  didn't last long enough and had crash after a few hours on Concerta.  Better for 2 hours after each dose Ritalin 20 mg with mood but then it wore off.  BP up after 3rd dose 160/90 and then took H's amlodipine 5 and BP became normal.  07/22/22 appt noted: Patient was administered Spravato 84 mg intranasally today.  The patient experienced the typical dissociation which gradually resolved over the 2-hour period of observation.  There were no complications.  Specifically the patient did not have nausea or vomiting or headache.  Specifically HA is less of a problem with Spravato. No SE with meds. Intensity of Spravato dissociation varies.  Not scary and well tolerated.  Start Jornay 20 mg over the weekend and did not notice any particular effect good or bad.  Increased to 40 mg. Other psych meds: Clonazepam 0.5 mg tablets 2 nightly, gabapentin dosage varies, Dayvigo 10 mg nightly, Lexapro 20 mg daily, to trazodone 100 mg nightly  07/24/22 appt noted: Patient was administered Spravato 84 mg intranasally today.  The patient experienced the typical dissociation which gradually resolved over  the 2-hour period of observation.  There were no complications.  Specifically the patient did not have nausea or vomiting or headache.  Specifically HA is less of a problem with Spravato. No SE with meds. Intensity of Spravato dissociation varies.  Not scary and well tolerated.  Other psych meds: Clonazepam 0.5 mg tablets 2 nightly, gabapentin dosage varies, Dayvigo 10 mg nightly, Lexapro 20 mg daily, to trazodone 100 mg nightly, Jornay 40 mg PM Doing well with Spravato.  Noticed a little effect from the Jornay 40 without SE but would like to increase the dose for ADD and mood.  Sleeping well.  No new concerns.  Still more depressed than she was with th initial benefit of Spravato.  07/30/22 note:  Patient was administered Spravato 84 mg intranasally today.  The patient experienced the typical dissociation which gradually  resolved over the 2-hour period of observation.  There were no complications.  Specifically the patient did not have nausea or vomiting or headache.  Specifically HA is less of a problem with Spravato. Other psych meds: Clonazepam 0.5 mg tablets 2 nightly, gabapentin dosage varies, Dayvigo 10 mg nightly, Lexapro 20 mg daily, to trazodone 100 mg nightly, Jornay 60 mg PM Wants to increase Jornay to 80 mg PM to increase benefit for ADD and depression. No SE She has experienced a major family crisis that threatens to change her daily life from now on.  It has not triggered suicidal thoughts at this time but she is very afraid.  No desire to change medications.  08/04/22 appt noted" Patient was administered Spravato 84 mg intranasally today.  The patient experienced the typical dissociation which gradually resolved over the 2-hour period of observation.  There were no complications.  Specifically the patient did not have nausea or vomiting or headache.  Specifically HA is less of a problem with Spravato. Other psych meds: Clonazepam 0.5 mg tablets 2 nightly, gabapentin dosage varies, Dayvigo 10 mg nightly, Lexapro 20 mg daily, trazodone 100 mg nightly, Jornay 80 mg PM No SE No benefit or SE with increase Jormay 80 mg.  Says Concerta helped ADD and depression but not Jornay.  However duration was insufficient.  Still depressed and wants change to another form of stimulant.  No concerns with other meds. Continues with strong family stressors creating uncertainty about her future but no quick resolution. No SI Trial Cotempla 17.3 and DC Ritalin & Jornay 80  08/11/2022 appointment noted: Patient was administered Spravato 84 mg intranasally today.  The patient experienced the typical dissociation which gradually resolved over the 2-hour period of observation.  There were no complications.  Specifically the patient did not have nausea or vomiting or headache.  Specifically HA is less of a problem with Spravato now  vs when started it.. Other psych meds: Clonazepam 0.5 mg tablets 2 nightly, gabapentin dosage varies, Dayvigo 10 mg nightly, trazodone 100 mg nightly, cotempla 17.3, switch from Lexapro 20 to sertaline 15o mg . No noticeable effects sertraline from for depression but has seen some antianxiety effects from the sertraline.  No side effects noted.  No side effects with the other medicines.  Still is only getting very brief benefit 3 to 4 hours from Cotempla for ADD and mood.  She wants to try the Daytrana patch as we have discussed before. No SE  08/13/22 appt: Patient was administered Spravato 84 mg intranasally today.  The patient experienced the typical dissociation which gradually resolved over the 2-hour period of observation.  There were no complications.  Specifically the patient did not have nausea or vomiting or headache.  Specifically HA is less of a problem with Spravato now vs when started it.. Other psych meds: Clonazepam 0.5 mg tablets 2 nightly, gabapentin dosage varies, Dayvigo 10 mg nightly, trazodone 100 mg nightly, cotempla 17.3, switch from Lexapro 20 to sertaline 15o mg . No noticeable effects sertraline from for depression but has seen some antianxiety effects from the sertraline.  No side effects noted.  No side effects with the other medicines.  Still is only getting very brief benefit 3 to 4 hours from Cotempla for ADD and mood.  She wants to try the Daytrana patch as we have discussed before.  Hasn't been able to get it yet. No SE   AIMS    Flowsheet Row Video Visit from 01/03/2022 in [facility]  AIMS Total Score 0      ECT-MADRS    Flowsheet Row Clinical Support from 08/13/2022 in [facility] Video Visit from 04/29/2022 in [facility] Video Visit from 02/06/2022 in [facility]  MADRS Total Score 27 44 23      PHQ2-9    Flowsheet Row Video Visit from  02/06/2022 in [facility] Office Visit from 11/16/2020 in [facility] [place] Office Visit from 04/19/2018 in [facility] [place]  PHQ-2 Total Score 6 0 0  PHQ-9 Total Score 14 -- 2        Past Psychiatric Medication Trials: Trintellix - Initially effective and then was not effective when re-started Paxil- Ineffective. Helped initially. Prozac-Insomnia Lexapro- Effective and then no longer as effective Zoloft- Initially effective and then not as effective.  Viibryd Cymbalta- Ineffective. Helped initially. Effexor XR- Effective, well tolerated. Pristiq- Increased anxiety at 150 mg daily Wellbutrin XL-Ineffective.May have increased anxiety. Amitriptyline Nortriptyline-Caused irritability, constipation Auvelity- ineffective Abilify- Helpful but caused severe insomnia. Rexulti-Helpful but caused insomnia. Vraylar Seroquel - Ineffective Risperdal Olanzapine- Ineffective Latuda- Ineffective Caplyta Klonopin- Effective Temazepam- Took during menopause Lunesta- Ineffective Ambien-Ineffective Sonata- Ineffective Dayvigo- partially effective Trazodone- Ineffective Remeron-Ineffective. Does not recall wt gain.   Adderall- Took once and had adverse effect. Concerta- Effective but only 4-6 hours Jornay 80 NR Metadate-Not as effective as Concerta Dexmethylphenidate- Not as effective as Concerta Azstarys- some side effects. Not as effective.  Ritalin short acting and SE anxiety & HTN @20  TID Cotemplay 17.3   Lamictal- May have had an adverse effects. "Felt weird." Reports worsening s/s.  Lithium Gabapentin Pramipexole- "felt really weird."   ECT #20 with minimal response. H says it was partly helpful.  AIMS    Flowsheet Row Video Visit from 01/03/2022 in [facility]  AIMS Total Score 0      ECT-MADRS    Flowsheet Row Clinical Support from  08/13/2022 in [facility] Video Visit from 04/29/2022 in [facility] Video Visit from 02/06/2022 in [facility]  MADRS Total Score 27 44 23      PHQ2-9    Flowsheet Row Video Visit from 02/06/2022 in [facility] Office Visit from 11/16/2020 in [facility] Office Visit from 04/19/2018 in [facility] Total Score 6 0 0  PHQ-9 Total Score 14 -- 2        Review of Systems:  Review of  Systems  Constitutional:  Positive for fatigue.  Cardiovascular:  Negative for palpitations.  Neurological:  Negative for tremors.  Psychiatric/Behavioral:  Positive for dysphoric mood. Negative for decreased concentration. The patient is nervous/anxious.     Medications: I have reviewed the patient's current medications.  Current Outpatient Medications  Medication Sig Dispense Refill   clonazePAM (KLONOPIN) 0.5 MG tablet 2 tablets at night and 1 tablet as needed daily for panic anxiety 75 tablet 1   doxycycline (VIBRAMYCIN) 50 MG capsule Take 50 mg by mouth daily.     Esketamine HCl, 84 MG Dose, (SPRAVATO, 84 MG DOSE,) 28 MG/DEVICE SOPK USE 3 SPRAYS IN EACH NOSTRIL EVERY 3 DAYS 3 each 2   estradiol (ESTRACE) 0.1 MG/GM vaginal cream USE 1 GM VAGINALLY TWICE A WEEK AT BEDTIME 42.5 g 0   gabapentin (NEURONTIN) 800 MG tablet Take 1 tablet (800 mg total) by mouth at bedtime. 90 tablet 1   MAGNESIUM PO Take by mouth.     Methylphenidate (COTEMPLA XR-ODT) 17.3 MG TBED Take 1 tablet by mouth every morning.     Multiple Vitamin (MULTIVITAMIN) tablet Take 1 tablet by mouth daily.     valACYclovir (VALTREX) 500 MG tablet Take one twice daily for 3 days with any symptoms 30 tablet 3   gabapentin (NEURONTIN) 100 MG capsule Take 1 capsule (100 mg total) by mouth 3 (three) times daily as needed. Take 1 capsule three times  daily 90 capsule 1   Lemborexant (DAYVIGO) 10 MG TABS Take 1 tablet (10 mg total) by mouth at bedtime. 30 tablet 2   methylphenidate (DAYTRANA) 30 MG/9HR Place 1 patch onto the skin daily. wear patch for 9 hours only each day (Patient not taking: Reported on 08/13/2022) 30 patch 0   sertraline (ZOLOFT) 100 MG tablet Take 2 tablets (200 mg total) by mouth daily. 180 tablet 0   traZODone (DESYREL) 100 MG tablet Take 1 tablet (100 mg total) by mouth at bedtime. Take 1-2 tabs po QHS prn 180 tablet 1   No current facility-administered medications for this visit.    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   TRANSAMINASES, SERUM, ELEVATED 08/14/2009    Past Medical History, Surgical history, Social history, and Family history were reviewed and updated as appropriate.   Please see review of systems for further details on the patient's review from today.   Objective:   Physical Exam:  LMP 12/27/2007 (Exact Date)   Physical Exam Constitutional:      General: She is not in acute distress. Musculoskeletal:        General: No deformity.  Neurological:     Mental Status: She is alert and oriented to person, place, and time.     Sensory: Sensory deficit present.     Coordination: Coordination normal.  Psychiatric:        Attention and Perception: Attention and perception normal. She does not perceive auditory hallucinations.        Mood and Affect: Mood is anxious and depressed. Affect is blunt. Affect  is not labile.        Speech: Speech normal. Speech is not slurred.        Behavior: Behavior normal.         Thought Content: Thought content normal. Thought content is not delusional. Thought content does not include homicidal or suicidal ideation. Thought content does not include suicidal plan.        Cognition and Memory: Cognition and memory normal.        Judgment: Judgment normal.     Comments: Insight intact More down with stress and worried. Affect more negative since family emergency     Lab Review:     Component Value Date/Time   NA 138 04/22/2022 0932   K 4.1 04/22/2022 0932   CL 98 04/22/2022 0932   CO2 31 04/22/2022 0932   GLUCOSE 102 (H) 04/22/2022 0932   BUN 10 04/22/2022 0932   CREATININE 0.71 04/22/2022 0932   CREATININE 0.68 01/26/2020 0920   CALCIUM 10.5 04/22/2022 0932   PROT 8.3 04/22/2022 0932   ALBUMIN 5.3 (H) 04/22/2022 0932   AST 82 (H) 04/22/2022 0932   ALT 118 (H) 04/22/2022 0932   ALKPHOS 74 04/22/2022 0932   BILITOT 0.3 04/22/2022 0932   GFRNONAA >90 05/01/2014 1645   GFRAA >90 05/01/2014 1645       Component Value Date/Time   WBC 3.3 (L) 04/22/2022 0932   RBC 4.25 04/22/2022 0932   HGB 14.3 04/22/2022 0932   HGB 14.1 10/26/2014 1034   HCT 40.7 04/22/2022 0932   PLT 245.0 04/22/2022 0932   MCV 95.7 04/22/2022 0932   MCH 34.4 (H) 01/26/2020 0920   MCHC 35.1 04/22/2022 0932   RDW 13.2 04/22/2022 0932   LYMPHSABS 0.9 04/22/2022 0932   MONOABS 0.2 04/22/2022 0932   EOSABS 0.1 04/22/2022 0932   BASOSABS 0.0 04/22/2022 0932    No results found for: "POCLITH", "LITHIUM"   No results found for: "PHENYTOIN", "PHENOBARB", "VALPROATE", "CBMZ"   .res Assessment: Plan:    Chase was seen today for follow-up, depression, anxiety, add and fatigue.  Diagnoses and all orders for this visit:  Recurrent major depression resistant to treatment -     sertraline (ZOLOFT) 100 MG tablet; Take 2 tablets (200 mg total) by mouth daily.  Attention deficit hyperactivity disorder (ADHD), predominantly inattentive type  Generalized anxiety disorder -      sertraline (ZOLOFT) 100 MG tablet; Take 2 tablets (200 mg total) by mouth daily.  Insomnia, unspecified type -     traZODone (DESYREL) 100 MG tablet; Take 1 tablet (100 mg total) by mouth at bedtime. Take 1-2 tabs po QHS prn -     Lemborexant (DAYVIGO) 10 MG TABS; Take 1 tablet (10 mg total) by mouth at bedtime.    Pt seen for TRD and administration of Spravato.  Patient was administered Spravato 84 mg intranasally today.  The patient experienced the typical dissociation which gradually resolved over the 2-hour period of observation.  There were no complications.  Specifically the patient did not have nausea or vomiting. But mild headache.  Blood pressures remained within normal ranges at the 40-minute and 2-hour follow-up intervals.  By the time the 2-hour observation period was met the patient was alert and oriented and able to exit without assistance.  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  See nursing note for further details.Tolerated the Spravato to 84mg  . Plan to continue twice daily  to max response.  Regarding med options.  She has obviously been thoroughly tried on numerous medications.  The only antidepressant category that she has not taken is MAO inhibitors which are not compatible with Spravato.  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 Disc this with her previously.    We discussed the short-term risks associated with benzodiazepines including sedation and increased fall risk among others.  Discussed long-term side effect risk including dependence, potential withdrawal symptoms, and the potential eventual dose-related risk of dementia.  But recent studies from 2020 dispute this association between benzodiazepines and dementia risk. Newer studies in 2020 do  not support an association with dementia.  Less intense depression with Spravato but some relapse ? Re: stopping Concerta bc of the crash.    Just Increased sertaline to 200 mg daily.  It has helped anxiety bu tnot depression.    Easiest trial for insomnia, clonazepam 1 mg HS. trazodone doesn't work well.  DC Cotempla and switch to Daytrana 30 mg AM to try to get better duration than po versions have given so far.  Crisis intervention around the events that have occurred in her family that may change her life from this point forward.  She is managing with expected emotional distress but not suicidal thought.  She does not desire any further medication changes except noted.  FU twice weekly Spravato until improvement maxes out then drop back to weekly.  She wants to continue twice weekly as long as possible.  Meredith Staggers, MD, DFAPA    Please see After Visit Summary for patient specific instructions.  Future Appointments  Date Time Provider Department Center  08/18/2022  1:00 PM Cottle, Steva Ready., MD [facility] None  08/18/2022  1:00 PM CP-NURSE [facility] None  08/19/2022  1:55 PM Jerene Bears, MD [facility]  08/21/2022  9:30 AM CP-NURSE [facility] None  08/21/2022  9:30 AM Mozingo, Thereasa Solo, NP [facility] None  08/25/2022  1:00 PM Cottle, Steva Ready., MD [facility] None  08/25/2022  1:00 PM CP-NURSE [facility] None    No orders of the defined types were placed in this encounter.   ------------------------------- T

## 2022-08-13 NOTE — Telephone Encounter (Signed)
Noted thanks.     Dr. Jennelle Human we will have to change then

## 2022-08-15 ENCOUNTER — Other Ambulatory Visit: Payer: Self-pay

## 2022-08-15 DIAGNOSIS — F900 Attention-deficit hyperactivity disorder, predominantly inattentive type: Secondary | ICD-10-CM

## 2022-08-15 MED ORDER — METHYLPHENIDATE 30 MG/9HR TD PTCH: 1.0000 | MEDICATED_PATCH | Freq: Every day | TRANSDERMAL | 0 refills | Status: DC

## 2022-08-17 NOTE — Progress Notes (Signed)
NURSES NOTE:     Patient arrived for her 54 th  Spravato treatment. Pt is being treated for Treatment Resistant Depression, the starting dose is 56 mg (2 of the 28 mg) nasal sprays. Pt will be receiving 84 mg (3 of the 28 mg) nasal sprays, this is her maintenance dose. Patient taken to treatment room, I explained and discussed her treatment and how the schedule should go, along with any side effects that may occur. Answered any questions and concerns the patient had. Pt's Spravato is delivered through [place] and she is now billing with her insurance. Spravato medication is stored at doctors office per REMS/FDA guidelines. The medication is required to be locked behind two doors per FDA/REMS Protocol. Medication is also disposed of properly per regulations. All treatments and her vital signs are documented in Spravato REMS per protocol of treatment center regulations.      Vital Signs assessed at 1:00 PM 132/74, pulse 68, pulse ox 92%. Instructed pt to blow her nose and recline back slightly to prevent any of medication dripping out of her nose.  Pt. Given 1st dose (28 mg inhaler), then 5 minutes between 2nd dose and 3rd dose. No complaints of nausea/vomiting reported. Pt did listen to music today, she just reclined back and closed her eyes. After 40 minutes I went to assess her vital signs, no side effects or symptoms reported, B/P 131/72, pulse 69. Informed pt could rest in the chair Dr. Jennelle Human came in to discuss her treatment at the end. She will continue twice a week Spravato treatments. Nurse was with pt a total of 60 minutes but pt observed for 120 minutes per protocol. Discharge vital signs were at 3:00 PM, B/P 97/62, pulse 77. She reports dissociation but she was clear upon her discharge. She scheduled her next apt  April 22 nd. Advised to contact me with any concerns or issues prior to her apt next week.    LOT # [id number] EXP FEB 2027

## 2022-08-18 ENCOUNTER — Encounter (HOSPITAL_BASED_OUTPATIENT_CLINIC_OR_DEPARTMENT_OTHER): Payer: Managed Care, Other (non HMO) | Admitting: Obstetrics & Gynecology

## 2022-08-18 ENCOUNTER — Ambulatory Visit (INDEPENDENT_AMBULATORY_CARE_PROVIDER_SITE_OTHER): Payer: 59 | Admitting: Psychiatry

## 2022-08-18 ENCOUNTER — Encounter: Payer: Self-pay | Admitting: Psychiatry

## 2022-08-18 ENCOUNTER — Ambulatory Visit: Payer: 59

## 2022-08-18 VITALS — BP 111/78 | HR 66

## 2022-08-18 DIAGNOSIS — G4700 Insomnia, unspecified: Secondary | ICD-10-CM | POA: Diagnosis not present

## 2022-08-18 DIAGNOSIS — F411 Generalized anxiety disorder: Secondary | ICD-10-CM

## 2022-08-18 DIAGNOSIS — F339 Major depressive disorder, recurrent, unspecified: Secondary | ICD-10-CM | POA: Diagnosis not present

## 2022-08-18 DIAGNOSIS — F900 Attention-deficit hyperactivity disorder, predominantly inattentive type: Secondary | ICD-10-CM | POA: Diagnosis not present

## 2022-08-18 DIAGNOSIS — Z6379 Other stressful life events affecting family and household: Secondary | ICD-10-CM

## 2022-08-18 MED ORDER — METHYLPHENIDATE 30 MG/9HR TD PTCH: 1.0000 | MEDICATED_PATCH | Freq: Every day | TRANSDERMAL | 0 refills | Status: DC

## 2022-08-18 NOTE — Progress Notes (Signed)
NURSES NOTE:     Patient arrived for her 76 th  Spravato treatment. Pt is being treated for Treatment Resistant Depression, the starting dose is 56 mg (2 of the 28 mg) nasal sprays. Pt will be receiving 84 mg (3 of the 28 mg) nasal sprays, this is her maintenance dose. Patient taken to treatment room, I explained and discussed her treatment and how the schedule should go, along with any side effects that may occur. Answered any questions and concerns the patient had. Pt's Spravato is delivered through [place] and she is now billing with her insurance. Spravato medication is stored at doctors office per REMS/FDA guidelines. The medication is required to be locked behind two doors per FDA/REMS Protocol. Medication is also disposed of properly per regulations. All treatments and her vital signs are documented in Spravato REMS per protocol of treatment center regulations.      Vital Signs assessed at 1:10 PM 123/77, pulse 70, pulse ox 92%. Instructed pt to blow her nose and recline back slightly to prevent any of medication dripping out of her nose.  Pt. Given 1st dose (28 mg inhaler), then 5 minutes between 2nd dose and 3rd dose. No complaints of nausea/vomiting reported. Pt did listen to music today, she just reclined back and closed her eyes. After 40 minutes I went to assess her vital signs, no side effects or symptoms reported, B/P 129/72, pulse 63. Informed pt could rest in the chair Dr. Jennelle Human came in to discuss her treatment at the end. She will continue twice a week Spravato treatments. Nurse was with pt a total of 60 minutes but pt observed for 120 minutes per protocol. Discharge vital signs were at 3:05 PM, B/P 111/78, pulse 66. She reports dissociation but she was clear upon her discharge. She scheduled her next apt  April 25th.. Advised to contact me with any concerns or issues prior to her apt next week.    LOT # [id number] EXP OCT 2026

## 2022-08-18 NOTE — Progress Notes (Signed)
Witney Huie [id number] March 27, 1961 62 y.o.  Subjective:   Patient ID:  Heather Davila is a 62 y.o. (DOB 1960-08-30) female.  Chief Complaint:  Chief Complaint  Patient presents with   Follow-up   Depression   ADD   Anxiety   Stress    HPI Heather Davila presents to the office today for follow-up of treatment resistant recurrent depression, generalized anxiety disorder and insomnia.  Almost too numerous to count med failures. She is here for Spravato administration for her treatment resistant depression.  05/21/22 appt noted: Patient was administered first dose Spravato 56 mg intranasally today.  The patient experienced the typical dissociation which gradually resolved over the 2-hour period of observation.  There were no complications.  Specifically the patient did not have nausea or vomiting or headache.   Dissociation was mild. Did not experience any emotional relief from disabling depression.wants to increase Spravato to the usual dose.  She is aware insurance is not covering the costs at this time.  No SI acutely. Tolerating meds.    05/23/22 appt noted: Patient was administered Spravato 84 mg intranasally today.  The patient experienced the typical dissociation which gradually resolved over the 2-hour period of observation.  There were no complications.  Specifically the patient did not have nausea or vomiting.   Dissociation was greater than last dose and a little bothersome DT some HA. Tolerating meds.   Mood has not changed significantly thus far with Spravato.  05/27/22 appt noted: Patient was administered Spravato 84 mg intranasally today.  The patient experienced the typical dissociation which gradually resolved over the 2-hour period of observation.  There were no complications.  Specifically the patient did not have nausea or vomiting or headache. Today she has felt a little relief from the pressing heaviness of depression but a little more anxiety.  During administration  of Spravato she listens to [place] music and was able to relax a little more with the feeling of dissociation that was mildly bothersome. Husband was also in on the session today and asked some questions about IV ketamine versus nasal spray ketamine.  05/29/22 appt noted: Cancelled today DT hypertension  05/30/22 appt noted: Patient was administered Spravato 84 mg intranasally today.  The patient experienced the typical dissociation which gradually resolved over the 2-hour period of observation.  There were no complications.  Specifically the patient did not have nausea or vomiting or headache. She is seeing some improvement in mood with less continuous depression and less intensity.  Hopefulness is better.   No med complaints or SE  06/05/22 appt noted: Patient was administered Spravato 84 mg intranasally today.  The patient experienced the typical dissociation which gradually resolved over the 2-hour period of observation.  There were no complications.  Specifically the patient did not have nausea or vomiting or headache.  Specifically HA is less of a problem with Spravato. No SE with meds. Depression is improving with less intensity.  Intermittent anxiety easily.  More able to enjoy things.  No new concerns.  06/17/22 appt noted: Patient was administered Spravato 84 mg intranasally today.  The patient experienced the typical dissociation which gradually resolved over the 2-hour period of observation.  There were no complications.  Specifically the patient did not have nausea or vomiting or headache.  Specifically HA is less of a problem with Spravato. No SE with meds. Depression is improved 45% with less intensity.  Intermittent anxiety less easily.  More able to enjoy things.  No new concerns.  More active.  Current meds: increased clonazepam to about 1.5 mg HS DT recent insomnia, lexapro 20, Dayvigo 10 mg HS, concerta 36 mg AM, trazodone 100 mg HS.  06/20/22 appt noted: Current meds: increased  clonazepam to about 1.5 mg HS DT recent insomnia, lexapro 20, Dayvigo 10 mg HS, concerta 36 mg AM, trazodone 100 mg HS. Patient was administered Spravato 84 mg intranasally today.  The patient experienced the typical dissociation which gradually resolved over the 2-hour period of observation.  There were no complications.  Specifically the patient did not have nausea or vomiting or headache.  Specifically HA is less of a problem with Spravato. No SE with meds. Depression is improved 45% with less intensity.  Intermittent anxiety less easily.  More able to enjoy things.  No new concerns.  More active.  06/23/22 appt noted:  Current meds: increased clonazepam to about 1.5 mg HS DT recent insomnia, lexapro 20, Dayvigo 10 mg HS, concerta 36 mg AM, trazodone 100 mg HS. Patient was administered Spravato 84 mg intranasally today.  The patient experienced the typical dissociation which gradually resolved over the 2-hour period of observation.  There were no complications.  Specifically the patient did not have nausea or vomiting or headache.  Specifically HA is less of a problem with Spravato. No SE with meds. Intensity of Spravato dissociation varies.  Last week very intesne and this week more typical.  Depression is continuing to improve with better interest and activity and motivation.  Gradual improvement.  Wants to continue.  06/25/22 appt noted: Current meds: increased clonazepam to about 1.5 mg HS DT recent insomnia, lexapro 20, Dayvigo 10 mg HS, concerta 36 mg AM, trazodone 100 mg HS. Patient was administered Spravato 84 mg intranasally today.  The patient experienced the typical dissociation which gradually resolved over the 2-hour period of observation.  There were no complications.  Specifically the patient did not have nausea or vomiting or headache.  Specifically HA is less of a problem with Spravato. No SE with meds. Intensity of Spravato dissociation varies.  No scary and well  tolerated. Continues to gradually improve with Spravato.  She is more motivated and enjoying things more.  H sees progress.  Wants to continue twice weekly until gets maximum response.  Dep is not gone yet.  06/30/22 appt noted:  seen with H Current meds: increased clonazepam to about 1.5 mg HS DT recent insomnia, lexapro 20, Dayvigo 10 mg HS, concerta 36 mg AM, trazodone 100 mg HS. Patient was administered Spravato 84 mg intranasally today.  The patient experienced the typical dissociation which gradually resolved over the 2-hour period of observation.  There were no complications.  Specifically the patient did not have nausea or vomiting or headache.  Specifically HA is less of a problem with Spravato. No SE with meds. Intensity of Spravato dissociation varies.  No scary and well tolerated. Continues to gradually improve with Spravato.  She is more motivated and enjoying things more.  H sees even more progress than she does; more active and smiling.  Wants to continue twice weekly until gets maximum response.  Dep is 80% better.  07/07/22 appt noted: Current meds: increased clonazepam to about 1.5 mg HS DT recent insomnia, lexapro 20, Dayvigo 10 mg HS, concerta 36 mg AM, trazodone 100 mg HS. Patient was administered Spravato 84 mg intranasally today.  The patient experienced the typical dissociation which gradually resolved over the 2-hour period of observation.  There were no complications.  Specifically the patient did not have nausea  or vomiting or headache.  Specifically HA is less of a problem with Spravato. No SE with meds. Intensity of Spravato dissociation varies.  No scary and well tolerated.  Was more intese today. Overall mood is markedly better and please with meds. No changes desired.  07/09/22 appt noted: In transition from Lexapro to sertraline and missing Concerta bc CO crash from it after about 4 hours.  Disc these concerns.  She'll discuss also with Corie Chiquito, NP More dep the  last couple of days and concerned about reducing Spravato frequency while transition from one antidep to another.  Affect less positive. Patient was administered Spravato 84 mg intranasally today.  The patient experienced the typical dissociation which gradually resolved over the 2-hour period of observation.  There were no complications.  Specifically the patient did not have nausea or vomiting or headache.  Specifically HA is less of a problem with Spravato. No SE with meds. Intensity of Spravato dissociation varies.  No scary and well tolerated.   07/14/22 appt noted: Has been more depressed this week.  More trouble with sleep.  Taking clonazepam 1-1.5  of the 0.5 mg tablets.   Trazodone not helping much. She switched back from 200 sertraline to Lexapro 20.  No SE More trouble with motivation.  Some stress with son with special needs. Misses the benevit of Concerta but it was too short acting and crashed in 4-6 hours. Patient was administered Spravato 84 mg intranasally today.  The patient experienced the typical dissociation which gradually resolved over the 2-hour period of observation.  There were no complications.  Specifically the patient did not have nausea or vomiting or headache.  Specifically HA is less of a problem with Spravato. No SE with meds. Intensity of Spravato dissociation varies.  No scary and well tolerated.   07/17/22 appt noted: Patient was administered Spravato 84 mg intranasally today.  The patient experienced the typical dissociation which gradually resolved over the 2-hour period of observation.  There were no complications.  Specifically the patient did not have nausea or vomiting or headache.  Specifically HA is less of a problem with Spravato. No SE with meds. Intensity of Spravato dissociation varies.  Not scary and well tolerated.  Just got Jornay last night but wasn't sure how to take it.  Wants to start and this was discussed.  She felt MPH helped mood and attn but  didn't last long enough and had crash after a few hours on Concerta.  Better for 2 hours after each dose Ritalin 20 mg with mood but then it wore off.  BP up after 3rd dose 160/90 and then took H's amlodipine 5 and BP became normal.  07/22/22 appt noted: Patient was administered Spravato 84 mg intranasally today.  The patient experienced the typical dissociation which gradually resolved over the 2-hour period of observation.  There were no complications.  Specifically the patient did not have nausea or vomiting or headache.  Specifically HA is less of a problem with Spravato. No SE with meds. Intensity of Spravato dissociation varies.  Not scary and well tolerated.  Start Jornay 20 mg over the weekend and did not notice any particular effect good or bad.  Increased to 40 mg. Other psych meds: Clonazepam 0.5 mg tablets 2 nightly, gabapentin dosage varies, Dayvigo 10 mg nightly, Lexapro 20 mg daily, to trazodone 100 mg nightly  07/24/22 appt noted: Patient was administered Spravato 84 mg intranasally today.  The patient experienced the typical dissociation which gradually resolved over  the 2-hour period of observation.  There were no complications.  Specifically the patient did not have nausea or vomiting or headache.  Specifically HA is less of a problem with Spravato. No SE with meds. Intensity of Spravato dissociation varies.  Not scary and well tolerated.  Other psych meds: Clonazepam 0.5 mg tablets 2 nightly, gabapentin dosage varies, Dayvigo 10 mg nightly, Lexapro 20 mg daily, to trazodone 100 mg nightly, Jornay 40 mg PM Doing well with Spravato.  Noticed a little effect from the Jornay 40 without SE but would like to increase the dose for ADD and mood.  Sleeping well.  No new concerns.  Still more depressed than she was with th initial benefit of Spravato.  07/30/22 note:  Patient was administered Spravato 84 mg intranasally today.  The patient experienced the typical dissociation which gradually  resolved over the 2-hour period of observation.  There were no complications.  Specifically the patient did not have nausea or vomiting or headache.  Specifically HA is less of a problem with Spravato. Other psych meds: Clonazepam 0.5 mg tablets 2 nightly, gabapentin dosage varies, Dayvigo 10 mg nightly, Lexapro 20 mg daily, to trazodone 100 mg nightly, Jornay 60 mg PM Wants to increase Jornay to 80 mg PM to increase benefit for ADD and depression. No SE She has experienced a major family crisis that threatens to change her daily life from now on.  It has not triggered suicidal thoughts at this time but she is very afraid.  No desire to change medications.  08/04/22 appt noted" Patient was administered Spravato 84 mg intranasally today.  The patient experienced the typical dissociation which gradually resolved over the 2-hour period of observation.  There were no complications.  Specifically the patient did not have nausea or vomiting or headache.  Specifically HA is less of a problem with Spravato. Other psych meds: Clonazepam 0.5 mg tablets 2 nightly, gabapentin dosage varies, Dayvigo 10 mg nightly, Lexapro 20 mg daily, trazodone 100 mg nightly, Jornay 80 mg PM No SE No benefit or SE with increase Jormay 80 mg.  Says Concerta helped ADD and depression but not Jornay.  However duration was insufficient.  Still depressed and wants change to another form of stimulant.  No concerns with other meds. Continues with strong family stressors creating uncertainty about her future but no quick resolution. No SI Trial Cotempla 17.3 and DC Ritalin & Jornay 80  08/11/2022 appointment noted: Patient was administered Spravato 84 mg intranasally today.  The patient experienced the typical dissociation which gradually resolved over the 2-hour period of observation.  There were no complications.  Specifically the patient did not have nausea or vomiting or headache.  Specifically HA is less of a problem with Spravato now  vs when started it.. Other psych meds: Clonazepam 0.5 mg tablets 2 nightly, gabapentin dosage varies, Dayvigo 10 mg nightly, trazodone 100 mg nightly, cotempla 17.3, switch from Lexapro 20 to sertaline 15o mg . No noticeable effects sertraline from for depression but has seen some antianxiety effects from the sertraline.  No side effects noted.  No side effects with the other medicines.  Still is only getting very brief benefit 3 to 4 hours from Cotempla for ADD and mood.  She wants to try the Daytrana patch as we have discussed before. No SE  08/13/22 appt: Patient was administered Spravato 84 mg intranasally today.  The patient experienced the typical dissociation which gradually resolved over the 2-hour period of observation.  There were no complications.  Specifically the patient did not have nausea or vomiting or headache.  Specifically HA is less of a problem with Spravato now vs when started it.. Other psych meds: Clonazepam 0.5 mg tablets 2 nightly, gabapentin dosage varies, Dayvigo 10 mg nightly, trazodone 100 mg nightly, cotempla 17.3, switch from Lexapro 20 to sertaline 15o mg . No noticeable effects sertraline from for depression but has seen some antianxiety effects from the sertraline.  No side effects noted.  No side effects with the other medicines.  Still is only getting very brief benefit 3 to 4 hours from Cotempla for ADD and mood.  She wants to try the Daytrana patch as we have discussed before.  Hasn't been able to get it yet. No SE Plan.  Continue to try to get Daytrana 30 AM  08/18/22 appt noted: Patient was administered Spravato 84 mg intranasally today.  The patient experienced the typical dissociation which gradually resolved over the 2-hour period of observation.  There were no complications.  Specifically the patient did not have nausea or vomiting or headache.  Specifically HA is less of a problem with Spravato now vs when started it.. Other psych meds: Clonazepam 0.5 mg  tablets 2 nightly, gabapentin dosage varies, Dayvigo 10 mg nightly, trazodone 100 mg nightly, cotempla 17.3, switch from Lexapro 20 to sertaline 15o mg . No noticeable effects sertraline from for depression but has seen some antianxiety effects from the sertraline.  No side effects noted.  No side effects with the other medicines.  Still is only getting very brief benefit 3 to 4 hours from Cotempla for ADD and mood.  She wants to try the Daytrana patch as we have discussed before.  Hasn't been able to get it yet.  Overall depression is some better than it was.   No SE Plan.  Continue to try to get Daytrana 30 AM   AIMS    Flowsheet Row Video Visit from 01/03/2022 in [facility]  AIMS Total Score 0      ECT-MADRS    Flowsheet Row Clinical Support from 08/13/2022 in [facility] Video Visit from 04/29/2022 in [facility] Video Visit from 02/06/2022 in [facility]  MADRS Total Score 27 44 23      PHQ2-9    Flowsheet Row Video Visit from 02/06/2022 in [facility] Office Visit from 11/16/2020 in [facility] [place] Office Visit from 04/19/2018 in [facility] [place]  PHQ-2 Total Score 6 0 0  PHQ-9 Total Score 14 -- 2        Past Psychiatric Medication Trials: Trintellix - Initially effective and then was not effective when re-started Paxil- Ineffective. Helped initially. Prozac-Insomnia Lexapro- Effective and then no longer as effective Zoloft- Initially effective and then not as effective.  Viibryd Cymbalta- Ineffective. Helped initially. Effexor XR- Effective, well tolerated. Pristiq- Increased anxiety at 150 mg daily Wellbutrin XL-Ineffective.May have increased anxiety. Amitriptyline Nortriptyline-Caused irritability, constipation Auvelity- ineffective Abilify- Helpful but  caused severe insomnia. Rexulti-Helpful but caused insomnia. Vraylar Seroquel - Ineffective Risperdal Olanzapine- Ineffective Latuda- Ineffective Caplyta Klonopin- Effective Temazepam- Took during menopause Lunesta- Ineffective Ambien-Ineffective Sonata- Ineffective Dayvigo- partially effective Trazodone- Ineffective Remeron-Ineffective. Does not recall wt gain.   Adderall- Took once and had adverse effect. Concerta- Effective but only 4-6 hours Jornay 80 NR Metadate-Not as effective as Concerta Dexmethylphenidate- Not as effective as Concerta Azstarys- some side effects. Not  as effective.  Ritalin short acting and SE anxiety & HTN @20  TID Cotemplay 17.3   Lamictal- May have had an adverse effects. "Felt weird." Reports worsening s/s.  Lithium Gabapentin Pramipexole- "felt really weird."   ECT #20 with minimal response. H says it was partly helpful.  AIMS    Flowsheet Row Video Visit from 01/03/2022 in [facility]  AIMS Total Score 0      ECT-MADRS    Flowsheet Row Clinical Support from 08/13/2022 in [facility] Video Visit from 04/29/2022 in [facility] Video Visit from 02/06/2022 in [facility]  MADRS Total Score 27 44 23      PHQ2-9    Flowsheet Row Video Visit from 02/06/2022 in [facility] Office Visit from 11/16/2020 in [facility] [place] Office Visit from 04/19/2018 in [facility] Total Score 6 0 0  PHQ-9 Total Score 14 -- 2        Review of Systems:  Review of Systems  Constitutional:  Positive for fatigue.  Cardiovascular:  Negative for palpitations.  Neurological:  Negative for tremors and weakness.  Psychiatric/Behavioral:  Positive for dysphoric mood. Negative for decreased concentration. The patient is nervous/anxious.      Medications: I have reviewed the patient's current medications.  Current Outpatient Medications  Medication Sig Dispense Refill   clonazePAM (KLONOPIN) 0.5 MG tablet 2 tablets at night and 1 tablet as needed daily for panic anxiety 75 tablet 1   doxycycline (VIBRAMYCIN) 50 MG capsule Take 50 mg by mouth daily.     Esketamine HCl, 84 MG Dose, (SPRAVATO, 84 MG DOSE,) 28 MG/DEVICE SOPK USE 3 SPRAYS IN EACH NOSTRIL EVERY 3 DAYS 3 each 2   estradiol (ESTRACE) 0.1 MG/GM vaginal cream USE 1 GM VAGINALLY TWICE A WEEK AT BEDTIME 42.5 g 0   gabapentin (NEURONTIN) 800 MG tablet Take 1 tablet (800 mg total) by mouth at bedtime. 90 tablet 1   Lemborexant (DAYVIGO) 10 MG TABS Take 1 tablet (10 mg total) by mouth at bedtime. 30 tablet 2   MAGNESIUM PO Take by mouth.     Methylphenidate (COTEMPLA XR-ODT) 17.3 MG TBED Take 1 tablet by mouth every morning.     Multiple Vitamin (MULTIVITAMIN) tablet Take 1 tablet by mouth daily.     sertraline (ZOLOFT) 100 MG tablet Take 2 tablets (200 mg total) by mouth daily. 180 tablet 0   traZODone (DESYREL) 100 MG tablet Take 1 tablet (100 mg total) by mouth at bedtime. Take 1-2 tabs po QHS prn 180 tablet 1   valACYclovir (VALTREX) 500 MG tablet Take one twice daily for 3 days with any symptoms 30 tablet 3   gabapentin (NEURONTIN) 100 MG capsule Take 1 capsule (100 mg total) by mouth 3 (three) times daily as needed. Take 1 capsule three times daily 90 capsule 1   methylphenidate (DAYTRANA) 30 MG/9HR Place 1 patch onto the skin daily. wear patch for 9 hours only each day 30 patch 0   No current facility-administered medications for this visit.    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   TRANSAMINASES, SERUM, ELEVATED 08/14/2009    Past Medical History, Surgical history, Social history, and Family history were reviewed and updated as appropriate.   Please see review of systems for further details on the patient's review from today.   Objective:   Physical Exam:  LMP 12/27/2007 (Exact Date)   Physical Exam Constitutional:      General: She is not in acute distress. Musculoskeletal:        General: No deformity.  Neurological:     Mental Status: She is alert and oriented to person, place, and time.     Sensory: Sensory deficit present.     Coordination: Coordination normal.  Psychiatric:        Attention and Perception: Attention and perception normal. She does not perceive auditory hallucinations.        Mood and Affect: Mood is anxious and depressed. Affect is blunt. Affect is not labile.        Speech: Speech normal. Speech is not slurred.        Behavior: Behavior normal.        Thought Content: Thought content normal. Thought content is not delusional. Thought content does not include homicidal or suicidal ideation. Thought content does not include suicidal plan.        Cognition and Memory: Cognition and memory normal.        Judgment: Judgment normal.     Comments: Insight intact More down with stress and worried. Affect blunted again     Lab Review:     Component Value Date/Time   NA 138 04/22/2022 0932   K 4.1 04/22/2022 0932   CL 98 04/22/2022 0932   CO2 31 04/22/2022 0932   GLUCOSE 102 (H) 04/22/2022 0932   BUN 10 04/22/2022 0932   CREATININE 0.71 04/22/2022 0932   CREATININE 0.68 01/26/2020 0920   CALCIUM 10.5 04/22/2022 0932   PROT 8.3 04/22/2022 0932   ALBUMIN 5.3 (H) 04/22/2022 0932   AST 82 (H) 04/22/2022 0932   ALT 118 (H) 04/22/2022 0932   ALKPHOS 74 04/22/2022 0932   BILITOT 0.3 04/22/2022 0932   GFRNONAA >90 05/01/2014  1645   GFRAA >90 05/01/2014 1645       Component Value Date/Time   WBC 3.3 (L) 04/22/2022 0932   RBC 4.25 04/22/2022 0932   HGB 14.3 04/22/2022 0932   HGB 14.1 10/26/2014 1034   HCT 40.7 04/22/2022 0932   PLT 245.0 04/22/2022 0932   MCV 95.7 04/22/2022 0932   MCH 34.4 (H) 01/26/2020 0920   MCHC 35.1 04/22/2022 0932   RDW 13.2 04/22/2022 0932   LYMPHSABS 0.9 04/22/2022 0932   MONOABS 0.2 04/22/2022 0932   EOSABS 0.1 04/22/2022 0932   BASOSABS 0.0 04/22/2022 0932    No results found for: "POCLITH", "LITHIUM"   No results found for: "PHENYTOIN", "PHENOBARB", "VALPROATE", "CBMZ"   .res Assessment: Plan:    Nur was seen today for follow-up, depression, add, anxiety and stress.  Diagnoses and all orders for this visit:  Recurrent major depression resistant to treatment  Attention deficit hyperactivity disorder (ADHD), predominantly inattentive type -     methylphenidate (DAYTRANA) 30 MG/9HR; Place 1 patch onto the skin daily. wear patch for 9 hours only each day  Generalized anxiety disorder  Insomnia, unspecified type  Stressful  life event affecting family    Pt seen for TRD and administration of Spravato.  Patient was administered Spravato 84 mg intranasally today.  The patient experienced the typical dissociation which gradually resolved over the 2-hour period of observation.  There were no complications.  Specifically the patient did not have nausea or vomiting. But mild headache.  Blood pressures remained within normal ranges at the 40-minute and 2-hour follow-up intervals.  By the time the 2-hour observation period was met the patient was alert and oriented and able to exit without assistance.  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  See nursing note for further details.Tolerated the Spravato to  . Plan to continue twice daily to max response.  Regarding med options.   She has obviously been thoroughly tried on numerous medications.  The only antidepressant category that she has not taken is MAO inhibitors which are not compatible with Spravato.  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 Disc this with her previously.    We discussed the short-term risks associated with benzodiazepines including sedation and increased fall risk among others.  Discussed long-term side effect risk including dependence, potential withdrawal symptoms, and the potential eventual dose-related risk of dementia.  But recent studies from 2020 dispute this association between benzodiazepines and dementia risk. Newer studies in 2020 do not support an association with dementia.  Less intense depression with Spravato but some relapse ? Re: stopping Concerta bc of the crash.    Continue sertaline to 200 mg daily.  It has helped anxiety bu tnot depression.    Easiest trial for insomnia, clonazepam 1 mg HS. trazodone doesn't work well.  DC Cotempla and switch to Daytrana 30 mg AM to try to get better duration than po versions have given so far.  Crisis intervention around the events that have occurred in her family that may change her life from this point forward.  She is managing with expected emotional distress but not suicidal thought.  She does not desire any further medication changes except noted.  FU twice weekly Spravato until improvement maxes out then drop back to weekly.  She wants to continue twice weekly as long as possible.  Meredith Staggers, MD, DFAPA    Please see After Visit Summary for patient specific instructions.  Future Appointments  Date Time Provider Department Center  08/19/2022  1:55 PM Jerene Bears, MD [facility]  08/21/2022  9:30 AM CP-NURSE [facility] None  08/21/2022  9:30 AM Mozingo, Thereasa Solo, NP [facility] None  08/22/2022   8:00 AM Shirline Frees, NP [facility]  08/25/2022  1:00 PM Cottle, Steva Ready., MD [facility] None  08/25/2022  1:00 PM CP-NURSE [facility] None    No orders of the defined types were placed in this encounter.   ------------------------------- T

## 2022-08-18 NOTE — Progress Notes (Signed)
NURSES NOTE:     Patient arrived for her 23rd  Spravato treatment. Pt is being treated for Treatment Resistant Depression, the starting dose is 56 mg (2 of the 28 mg) nasal sprays. Pt will be receiving 84 mg (3 of the 28 mg) nasal sprays, this is her maintenance dose. Patient taken to treatment room, I explained and discussed her treatment and how the schedule should go, along with any side effects that may occur. Answered any questions and concerns the patient had. Pt's Spravato is delivered through [place] and she is now billing with her insurance. Spravato medication is stored at doctors office per REMS/FDA guidelines. The medication is required to be locked behind two doors per FDA/REMS Protocol. Medication is also disposed of properly per regulations. All treatments and her vital signs are documented in Spravato REMS per protocol of treatment center regulations.      Vital Signs assessed at 8:05 AM 109/67, pulse 68, pulse ox 92%. Instructed pt to blow her nose and recline back slightly to prevent any of medication dripping out of her nose.  Pt. Given 1st dose (28 mg inhaler), then 5 minutes between 2nd dose and 3rd dose. No complaints of nausea/vomiting reported. Pt did listen to music today, she just reclined back and closed her eyes. After 40 minutes I went to assess her vital signs, no side effects or symptoms reported, B/P 123/70, pulse 63. Informed pt could rest in the chair Dr. Jennelle Human came in to discuss her treatment at the end. She will continue twice a week Spravato treatments. Nurse was with pt a total of 60 minutes but pt observed for 120 minutes per protocol. Discharge vital signs were at 9:55 AM, B/P 118/75, pulse 71. She reports dissociation but she was clear upon her discharge. She scheduled her next apt  April 15th. Advised to contact me with any concerns or issues prior to her apt next week.    LOT # [id number] EXP JAN 2027

## 2022-08-19 ENCOUNTER — Encounter (HOSPITAL_BASED_OUTPATIENT_CLINIC_OR_DEPARTMENT_OTHER): Payer: Self-pay | Admitting: Obstetrics & Gynecology

## 2022-08-19 ENCOUNTER — Ambulatory Visit (HOSPITAL_BASED_OUTPATIENT_CLINIC_OR_DEPARTMENT_OTHER): Payer: Managed Care, Other (non HMO) | Admitting: Obstetrics & Gynecology

## 2022-08-19 ENCOUNTER — Other Ambulatory Visit (HOSPITAL_COMMUNITY)
Admission: RE | Admit: 2022-08-19 | Discharge: 2022-08-19 | Disposition: A | Discharge status: Home / Self Care | Payer: Managed Care, Other (non HMO) | Source: Ambulatory Visit | Attending: Obstetrics & Gynecology | Admitting: Obstetrics & Gynecology

## 2022-08-19 VITALS — BP 131/81 | HR 70 | Ht 60.0 in | Wt 88.8 lb

## 2022-08-19 DIAGNOSIS — A6009 Herpesviral infection of other urogenital tract: Secondary | ICD-10-CM

## 2022-08-19 DIAGNOSIS — Z8659 Personal history of other mental and behavioral disorders: Secondary | ICD-10-CM | POA: Diagnosis not present

## 2022-08-19 DIAGNOSIS — Z01419 Encounter for gynecological examination (general) (routine) without abnormal findings: Secondary | ICD-10-CM | POA: Diagnosis not present

## 2022-08-19 DIAGNOSIS — N898 Other specified noninflammatory disorders of vagina: Secondary | ICD-10-CM

## 2022-08-19 DIAGNOSIS — Z124 Encounter for screening for malignant neoplasm of cervix: Secondary | ICD-10-CM | POA: Insufficient documentation

## 2022-08-19 DIAGNOSIS — Z87440 Personal history of urinary (tract) infections: Secondary | ICD-10-CM

## 2022-08-19 MED ORDER — ESTRADIOL 10 MCG VA TABS
1.0000 | ORAL_TABLET | VAGINAL | 3 refills | Status: DC
Start: 2022-08-21 — End: 2023-08-28

## 2022-08-19 MED ORDER — VALACYCLOVIR HCL 500 MG PO TABS: ORAL_TABLET | ORAL | 3 refills | Status: DC

## 2022-08-19 NOTE — Progress Notes (Signed)
62 y.o. [id number] Married Other or two or more races female here for annual exam.  Has experienced issues with mood disorder this year.  Followed by [facility].  She is changing therapy to [facility].  Has questions about   Denies vaginal bleeding.  Has had three UTIs in the last few months.  Went to urgent care for treatment.  Has been using vaginal estrogen cream but felt was messy and stopped months ago.  Vagifem discussed.    H/o HSV.  Uses valtrex with outbreaks.  Needs RF.  Patient's last menstrual period was 12/27/2007 (exact date).          Sexually active: Yes.    The current method of family planning is post menopausal status.    Smoker:  no  Health Maintenance: Pap:  2020 History of abnormal Pap:  no MMG:  12/2020 Colonoscopy:  09/2018.  Follow up 3 years.   BMD:   guidelines reviewed Screening Labs: 03/2022   reports that she quit smoking about 35 years ago. Her smoking use included cigarettes. She has a 2.50 pack-year smoking history. She has never used smokeless tobacco. She reports that she does not drink alcohol and does not use drugs.  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   TRANSAMINASES, SERUM, ELEVATED 08/14/2009    Past Surgical History:  Procedure Laterality Date   AUGMENTATION MAMMAPLASTY Bilateral    BREAST ENHANCEMENT SURGERY Bilateral    BUNIONECTOMY     CESAREAN SECTION     X 3   FACELIFT  2022   HYSTEROSCOPY  12/27/2007   and HTA   UPPER GASTROINTESTINAL ENDOSCOPY  2020    Current Outpatient Medications  Medication Sig Dispense Refill   clonazePAM (KLONOPIN) 0.5 MG tablet 2 tablets at night and 1 tablet as needed daily for panic anxiety 75 tablet 1   Esketamine HCl, 84 MG Dose, (SPRAVATO, 84 MG DOSE,) 28 MG/DEVICE SOPK USE 3  SPRAYS IN EACH NOSTRIL EVERY 3 DAYS 3 each 2   estradiol (ESTRACE) 0.1 MG/GM vaginal cream USE 1 GM VAGINALLY TWICE A WEEK AT BEDTIME 42.5 g 0   gabapentin (NEURONTIN) 800 MG tablet Take 1 tablet (800 mg total) by mouth at bedtime. 90 tablet 1   Lemborexant (DAYVIGO) 10 MG TABS Take 1 tablet (10 mg total) by mouth at bedtime. 30 tablet 2   MAGNESIUM PO Take by mouth.     Methylphenidate (COTEMPLA XR-ODT) 17.3 MG TBED Take 1 tablet by mouth every morning.     Multiple Vitamin (MULTIVITAMIN) tablet Take 1 tablet by mouth daily.     sertraline (ZOLOFT) 100 MG tablet Take 2 tablets (200 mg total) by mouth daily. 180 tablet 0   traZODone (DESYREL) 100 MG tablet Take 1 tablet (100 mg total) by mouth at bedtime. Take 1-2 tabs po QHS prn 180 tablet 1   valACYclovir (VALTREX) 500 MG tablet Take one twice daily for 3 days with any symptoms 30 tablet 3   doxycycline (VIBRAMYCIN) 50 MG capsule Take 50 mg by mouth daily. (Patient not taking: Reported on 08/19/2022)     gabapentin (NEURONTIN) 100 MG capsule Take 1 capsule (100 mg total) by mouth 3 (three) times daily as needed. Take 1  capsule three times daily 90 capsule 1   methylphenidate (DAYTRANA) 30 MG/9HR Place 1 patch onto the skin daily. wear patch for 9 hours only each day (Patient not taking: Reported on 08/19/2022) 30 patch 0   No current facility-administered medications for this visit.    Family History  Problem Relation Age of Onset   Cancer Mother 90       colon cancer   Hypertension Mother    Colon cancer Mother 42       colon rescection   Colon polyps Mother    Hypertension Father    Heart disease Father 9       CABG62   Stroke Father 83   Heart disease Sister        atrial fibrillation   Heart attack Sister 87       no stents   Depression Sister    Heart failure Paternal Aunt    Diabetes Paternal Uncle    Heart disease Paternal Grandfather    Esophageal cancer Neg Hx    Rectal cancer Neg Hx    Stomach cancer Neg Hx      ROS: Constitutional: negative Genitourinary:negative  Exam:   BP 131/81 (BP Location: Left Arm, Patient Position: Sitting, Cuff Size: Normal)   Pulse 70   Ht 5' (1.524 m) Comment: Reported  Wt 88 lb 12.8 oz (40.3 kg)   LMP 12/27/2007 (Exact Date)   BMI 17.34 kg/m   Height: 5' (152.4 cm) (Reported)  General appearance: alert, cooperative and appears stated age Head: Normocephalic, without obvious abnormality, atraumatic Neck: no adenopathy, supple, symmetrical, trachea midline and thyroid normal to inspection and palpation Lungs: clear to auscultation bilaterally Breasts: normal appearance, no masses or tenderness Heart: regular rate and rhythm Abdomen: soft, non-tender; bowel sounds normal; no masses,  no organomegaly Extremities: extremities normal, atraumatic, no cyanosis or edema Skin: Skin color, texture, turgor normal. No rashes or lesions Lymph nodes: Cervical, supraclavicular, and axillary nodes normal. No abnormal inguinal nodes palpated Neurologic: Grossly normal   Pelvic: External genitalia:  no lesions              Urethra:  normal appearing urethra with no masses, tenderness or lesions              Bartholins and Skenes: normal                 Vagina: normal appearing vagina with normal color and no discharge, no lesions              Cervix: no lesions              Pap taken: Yes.   Bimanual Exam:  Uterus:  normal size, contour, position, consistency, mobility, non-tender              Adnexa: normal adnexa and no mass, fullness, tenderness               Rectovaginal: Confirms               Anus:  normal sphincter tone, no lesions  Chaperone, Ina Homes, CMA, was present for exam.  Assessment/Plan: 1. Well woman exam with routine gynecological exam - Pap smear obtained today - Mammogram scheduled 09/11/2022 - Colonoscopy 09/2018.  Follow up 3 years.  Pt aware due. - Bone mineral density guidelines reviewed - lab work done with PCP, Dr. Caryl Never -  vaccines reviewed/updated  2. Cervical cancer screening - Cytology - PAP( [place])  3. Herpes genitalis in  women - valACYclovir (VALTREX) 500 MG tablet; Take one twice daily for 3 days with any symptoms  Dispense: 30 tablet; Refill: 3  4. Vaginal dryness - Estradiol (VAGIFEM) 10 MCG TABS vaginal tablet; Place 1 tablet (10 mcg total) vaginally 2 (two) times a week.  Dispense: 24 tablet; Refill: 3  5. History of depression  6.  History of UTI - encouraged pt have urine culture done with any future UTI to ensure there is bacteria present and also that it is being fully treated.   - relation to menopausal changes discussed

## 2022-08-20 ENCOUNTER — Encounter: Payer: 59 | Admitting: Psychiatry

## 2022-08-20 ENCOUNTER — Telehealth (HOSPITAL_BASED_OUTPATIENT_CLINIC_OR_DEPARTMENT_OTHER): Payer: Self-pay | Admitting: Obstetrics & Gynecology

## 2022-08-20 NOTE — Telephone Encounter (Signed)
Patient called and would like for mammogram order be sent to where she had last time not [place] .Per  she has change mind about going there.

## 2022-08-21 ENCOUNTER — Encounter: Payer: Self-pay | Admitting: Adult Health

## 2022-08-21 ENCOUNTER — Ambulatory Visit: Payer: 59

## 2022-08-21 ENCOUNTER — Ambulatory Visit (INDEPENDENT_AMBULATORY_CARE_PROVIDER_SITE_OTHER): Payer: 59 | Admitting: Adult Health

## 2022-08-21 VITALS — BP 127/79 | HR 64

## 2022-08-21 DIAGNOSIS — F339 Major depressive disorder, recurrent, unspecified: Secondary | ICD-10-CM | POA: Diagnosis not present

## 2022-08-21 NOTE — Progress Notes (Signed)
NURSES NOTE:     Patient arrived for her 27 th  Spravato treatment. Pt is being treated for Treatm61ent Resistant Depression, the starting dose is 56 mg (2 of the 28 mg) nasal sprays. Pt will be receiving 84 mg (3 of the 28 mg) nasal sprays, this is her maintenance dose. Patient taken to treatment room, I explained and discussed her treatment and how the schedule should go, along with any side effects that may occur. Answered any questions and concerns the patient had. Pt's Spravato is delivered through [place] and she is now billing with her insurance. Spravato medication is stored at doctors office per REMS/FDA guidelines. The medication is required to be locked behind two doors per FDA/REMS Protocol. Medication is also disposed of properly per regulations. All treatments and her vital signs are documented in Spravato REMS per protocol of treatment center regulations.      Pt reports [facility] at [facility] notified her that her Daytrana patches have came in and she can pick them up this morning. Vital Signs assessed at 8:00 AM 118/66, pulse 60, pulse ox 92%. Instructed pt to blow her nose and recline back slightly to prevent any of medication dripping out of her nose.  Pt. Given 1st dose (28 mg inhaler), then 5 minutes between 2nd dose and 3rd dose. No complaints of nausea/vomiting reported. Pt did listen to music today, she just reclined back and closed her eyes. After 40 minutes I went to assess her vital signs, no side effects or symptoms reported, B/P 120/54, pulse 59. Informed pt could rest in the chair Dr. Jennelle Human came in to discuss her treatment at the end. She will continue twice a week Spravato treatments. Nurse was with pt a total of 60 minutes but pt observed for 120 minutes per protocol. Discharge vital signs were at 9:55 AM, B/P 127/79, pulse 64. She reports dissociation but she was clear upon her discharge. She scheduled her next apt  April 29th.. Advised to contact me with any concerns or issues  prior to her apt next week.    LOT # [id number] EXP OCT 2026

## 2022-08-21 NOTE — Progress Notes (Signed)
Heather Davila [id number] 09-04-1960 62 y.o.  Subjective:   Patient ID:  Heather Davila is a 62 y.o. (DOB [birth year]) female.  Chief Complaint: No chief complaint on file.   HPI Heather Davila presents to the office today for follow-up of treatment resistant depression (TRD).   Spravato treatment    Patient was administered Spravato 84 mg intranasally today. Patient was observed by provider throughout [facility] treatment. The patient experienced the typical dissociation which gradually resolved over the 2-hour period of observation. There were no complications. Specifically, the patient did not have any untoward side effects - feeling disconnected from themself, their thoughts, feelings and things around them, dizziness, nausea, feeling sleepy, decreased feeling of sensitivity (numbness) spinning sensation, feeling anxious, lack of energy, increased blood pressure, feeling happy or very excited, or headache. Blood pressures remained within normal ranges at the 40-minute and 2-hour follow-up intervals. By the time the 2-hour observation period was met the patient was alert and oriented and able to exit without assistance. Patient willing to continue Spravato administration for the treatment of resistant depression. See nursing note for further details.   AIMS    Flowsheet Row Video Visit from 01/03/2022 in [facility]  AIMS Total Score 0      ECT-MADRS    Flowsheet Row Clinical Support from 08/13/2022 in [facility] Video Visit from 04/29/2022 in [facility] Video Visit from 02/06/2022 in [facility]  MADRS Total Score 27 44 23      PHQ2-9    Flowsheet Row Office Visit from 08/19/2022 in [facility] Video Visit from 02/06/2022 in [facility] Office Visit from 11/16/2020 in [facility] for  [facility] [place] Office Visit from 04/19/2018 in [facility] Total Score 0 6 0 0  PHQ-9 Total Score -- 14 -- 2        Review of Systems:  Review of Systems  Musculoskeletal:  Negative for gait problem.  Neurological:  Negative for tremors.  Psychiatric/Behavioral:         Please refer to HPI    Medications: I have reviewed the patient's current medications.  Current Outpatient Medications  Medication Sig Dispense Refill   clonazePAM (KLONOPIN) 0.5 MG tablet 2 tablets at night and 1 tablet as needed daily for panic anxiety 75 tablet 1   doxycycline (VIBRAMYCIN) 50 MG capsule Take 50 mg by mouth daily. (Patient not taking: Reported on 08/19/2022)     Esketamine HCl, 84 MG Dose, (SPRAVATO, 84 MG DOSE,) 28 MG/DEVICE SOPK USE 3 SPRAYS IN EACH NOSTRIL EVERY 3 DAYS 3 each 2   Estradiol (VAGIFEM) 10 MCG TABS vaginal tablet Place 1 tablet (10 mcg total) vaginally 2 (two) times a week. 24 tablet 3   gabapentin (NEURONTIN) 100 MG capsule Take 1 capsule (100 mg total) by mouth 3 (three) times daily as needed. Take 1 capsule three times daily 90 capsule 1   gabapentin (NEURONTIN) 800 MG tablet Take 1 tablet (800 mg total) by mouth at bedtime. 90 tablet 1   Lemborexant (DAYVIGO) 10 MG TABS Take 1 tablet (10 mg total) by mouth at bedtime. 30 tablet 2   MAGNESIUM PO Take by mouth.     Methylphenidate (COTEMPLA XR-ODT) 17.3 MG TBED Take 1 tablet by mouth every morning.     methylphenidate (DAYTRANA) 30 MG/9HR Place 1 patch onto the skin  daily. wear patch for 9 hours only each day (Patient not taking: Reported on 08/19/2022) 30 patch 0   Multiple Vitamin (MULTIVITAMIN) tablet Take 1 tablet by mouth daily.     sertraline (ZOLOFT) 100 MG tablet Take 2 tablets (200 mg total) by mouth daily. 180 tablet 0   traZODone (DESYREL) 100 MG tablet Take 1 tablet (100 mg total) by mouth at bedtime. Take 1-2 tabs po QHS prn 180 tablet 1   valACYclovir  (VALTREX) 500 MG tablet Take one twice daily for 3 days with any symptoms 30 tablet 3   No current facility-administered medications for this visit.    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   TRANSAMINASES, SERUM, ELEVATED 08/14/2009    Past Medical History, Surgical history, Social history, and Family history were reviewed and updated as appropriate.   Please see review of systems for further details on the patient's review from today.   Objective:   Physical Exam:  LMP 12/27/2007 (Exact Date)   Physical Exam Constitutional:      General: She is not in acute distress. Musculoskeletal:        General: No deformity.  Neurological:     Mental Status: She is alert and oriented to person, place, and time.     Coordination: Coordination normal.  Psychiatric:        Attention and Perception: Attention and perception normal. She does not perceive auditory or visual hallucinations.        Mood and Affect: Mood normal. Mood is not anxious or depressed. Affect is not labile, blunt, angry or inappropriate.        Speech: Speech normal.        Behavior: Behavior normal.        Thought Content: Thought content normal. Thought content is not paranoid or delusional. Thought content does not include homicidal or suicidal ideation. Thought content does not include homicidal or suicidal plan.        Cognition and Memory: Cognition and memory normal.        Judgment: Judgment normal.     Comments: Insight intact     Lab Review:     Component Value Date/Time   NA 138 04/22/2022 0932   K 4.1 04/22/2022 0932    CL 98 04/22/2022 0932   CO2 31 04/22/2022 0932   GLUCOSE 102 (H) 04/22/2022 0932   BUN 10 04/22/2022 0932   CREATININE 0.71 04/22/2022 0932   CREATININE 0.68 01/26/2020 0920   CALCIUM 10.5 04/22/2022 0932   PROT 8.3 04/22/2022 0932   ALBUMIN 5.3 (H) 04/22/2022 0932   AST 82 (H) 04/22/2022 0932   ALT 118 (H) 04/22/2022 0932   ALKPHOS 74 04/22/2022 0932   BILITOT 0.3 04/22/2022 0932   GFRNONAA >90 05/01/2014 1645   GFRAA >90 05/01/2014 1645       Component Value Date/Time   WBC 3.3 (L) 04/22/2022 0932   RBC 4.25 04/22/2022 0932   HGB 14.3 04/22/2022 0932   HGB 14.1 10/26/2014 1034   HCT 40.7  04/22/2022 0932   PLT 245.0 04/22/2022 0932   MCV 95.7 04/22/2022 0932   MCH 34.4 (H) 01/26/2020 0920   MCHC 35.1 04/22/2022 0932   RDW 13.2 04/22/2022 0932   LYMPHSABS 0.9 04/22/2022 0932   MONOABS 0.2 04/22/2022 0932   EOSABS 0.1 04/22/2022 0932   BASOSABS 0.0 04/22/2022 0932    No results found for: "POCLITH", "LITHIUM"   No results found for: "PHENYTOIN", "PHENOBARB", "VALPROATE", "CBMZ"   .res Assessment: Plan:    Recommendations/Plan   Continue Spravato treatment.   Patient advised to contact office with any questions, adverse effects, or acute worsening in signs and symptoms.  Diagnoses and all orders for this visit:  Recurrent major depression resistant to treatment     Please see After Visit Summary for patient specific instructions.  Future Appointments  Date Time Provider Department Center  08/21/2022  9:30 AM Dericka Ostenson, Thereasa Solo, NP [facility] None  08/22/2022  8:00 AM Shirline Frees, NP [facility]  08/25/2022  1:00 PM Cottle, Steva Ready., MD [facility] None  08/25/2022  1:00 PM CP-NURSE CP-CP None  09/18/2023  8:15 AM Jerene Bears, MD [facility]    No orders of the defined types were placed in this encounter.   -------------------------------

## 2022-08-22 ENCOUNTER — Other Ambulatory Visit (HOSPITAL_BASED_OUTPATIENT_CLINIC_OR_DEPARTMENT_OTHER): Payer: Self-pay | Admitting: Obstetrics & Gynecology

## 2022-08-22 ENCOUNTER — Other Ambulatory Visit: Payer: Self-pay | Admitting: Psychiatry

## 2022-08-22 ENCOUNTER — Encounter: Payer: Self-pay | Admitting: Adult Health

## 2022-08-22 ENCOUNTER — Ambulatory Visit: Payer: Managed Care, Other (non HMO) | Admitting: Adult Health

## 2022-08-22 ENCOUNTER — Telehealth: Payer: Self-pay

## 2022-08-22 VITALS — BP 100/70 | HR 70 | Temp 97.5°F | Wt 88.0 lb

## 2022-08-22 DIAGNOSIS — R748 Abnormal levels of other serum enzymes: Secondary | ICD-10-CM

## 2022-08-22 DIAGNOSIS — F339 Major depressive disorder, recurrent, unspecified: Secondary | ICD-10-CM

## 2022-08-22 DIAGNOSIS — Z1231 Encounter for screening mammogram for malignant neoplasm of breast: Secondary | ICD-10-CM

## 2022-08-22 LAB — CYTOLOGY - PAP
Comment: NEGATIVE
Diagnosis: NEGATIVE
High risk HPV: NEGATIVE

## 2022-08-22 NOTE — Progress Notes (Signed)
Subjective:    Patient ID: Heather Davila, female    DOB: 1960/09/15, 63 y.o.   MRN: [id number]  HPI 62 year old female who  has a past medical history of Adult acne, Anemia, Anorexia nervosa (teen), DEPRESSION (08/09/2009), Hematoma (09/2020), Insomnia, STD (sexually transmitted disease) (08/05/2013), and TRANSAMINASES, SERUM, ELEVATED (08/14/2009).  She presents to the office today for follow up, she is a patient of Dr. Caryl Never who I am seeing today. She has a history of persistent elevated liver transaminases these have been up for several years and has had a work up before including ultrasound in 2016 with unrevealing etiology. During her visit with her PCP in December she was referred to GI for further evaluation. This referral was placed but she never scheduled an appointment d/t " I saw Dr. Christella Hartigan a few years ago and he could not find the reason why my liver enzymes are elevated". She may be interested in going elsewhere, she would like me to place the order and she will think about it.   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   TRANSAMINASES, SERUM, ELEVATED 08/14/2009    Social History   Socioeconomic History   Marital status: Married    Spouse name: Charles   Number of children: 3   Years of education: Master's   Highest education level: Master's degree (e.g., MA, MS, MEng, MEd, MSW, MBA)  Occupational History   Occupation: Academic librarian: [employer]  Tobacco Use   Smoking status: Former    Packs/day: 0.50    Years: 5.00    Additional pack years: 0.00    Total pack  years: 2.50    Types: Cigarettes    Quit date: 07/10/1987    Years since quitting: 35.1   Smokeless tobacco: Never  Vaping Use   Vaping Use: Never used  Substance and Sexual Activity   Alcohol use: No   Drug use: No   Sexual activity: Yes    Partners: Male    Birth control/protection: Post-menopausal, Other-see comments    Comment: vasectomy  Other Topics Concern   Not on file  Social History Narrative   Patient is married Scientist, physiological) and lives at home with her husband and two children.   Patient has three children.   Patient works at [employer].   Patient has a Master's degree.   Patient is left-handed.   Patient drinks 6 cups of coffee daily.   Social Determinants of Health   Financial Resource Strain: Low Risk  (08/21/2022)   Overall Financial Resource Strain (CARDIA)    Difficulty of Paying Living Expenses: Not hard at all  Food Insecurity: No Food Insecurity (08/21/2022)   Hunger Vital Sign    Worried About Running Out of Food in the Last Year: Never true    Ran Out of Food in the Last Year: Never true  Transportation Needs: No Transportation Needs (08/21/2022)   PRAPARE - Administrator, Civil Service (Medical): No  Lack of Transportation (Non-Medical): No  Physical Activity: Unknown (08/21/2022)   Exercise Vital Sign    Days of Exercise per Week: 0 days    Minutes of Exercise per Session: Not on file  Stress: No Stress Concern Present (08/21/2022)   [employer] of Occupational Health - Occupational Stress Questionnaire    Feeling of Stress : Not at all  Social Connections: Moderately Integrated (08/21/2022)   Social Connection and Isolation Panel [NHANES]    Frequency of Communication with Friends and Family: More than three times a week    Frequency of Social Gatherings with Friends and Family: Twice a week    Attends Religious Services: More than 4 times per year    Active Member of [employer] or Organizations: No    Attends Museum/gallery exhibitions officer: Not on file    Marital Status: Married  Catering manager Violence: Not on file    Past Surgical History:  Procedure Laterality Date   AUGMENTATION MAMMAPLASTY Bilateral    BREAST ENHANCEMENT SURGERY Bilateral    BUNIONECTOMY     CESAREAN SECTION     X 3   FACELIFT  2022   HYSTEROSCOPY  12/27/2007   and HTA   UPPER GASTROINTESTINAL ENDOSCOPY  2020    Family History  Problem Relation Age of Onset   Cancer Mother 61       colon cancer   Hypertension Mother    Colon cancer Mother 97       colon rescection   Colon polyps Mother    Hypertension Father    Heart disease Father 11       CABG62   Stroke Father 64   Heart disease Sister        atrial fibrillation   Heart attack Sister 91       no stents   Depression Sister    Heart failure Paternal Aunt    Diabetes Paternal Uncle    Heart disease Paternal Grandfather    Esophageal cancer Neg Hx    Rectal cancer Neg Hx    Stomach cancer Neg Hx     Allergies  Allergen Reactions   Ciprofloxacin     REACTION: hives   Oxycodone-Acetaminophen     REACTION: hives    Current Outpatient Medications on File Prior to Visit  Medication Sig Dispense Refill   clonazePAM (KLONOPIN) 0.5 MG tablet 2 tablets at night and 1 tablet as needed daily for panic anxiety 75 tablet 1   doxycycline (VIBRAMYCIN) 50 MG capsule Take 50 mg by mouth daily. (Patient not taking: Reported on 08/19/2022)     Esketamine HCl, 84 MG Dose, (SPRAVATO, 84 MG DOSE,) 28 MG/DEVICE SOPK USE 3 SPRAYS IN EACH NOSTRIL EVERY 3 DAYS 3 each 2   Estradiol (VAGIFEM) 10 MCG TABS vaginal tablet Place 1 tablet (10 mcg total) vaginally 2 (two) times a week. 24 tablet 3   gabapentin (NEURONTIN) 100 MG capsule Take 1 capsule (100 mg total) by mouth 3 (three) times daily as needed. Take 1 capsule three times daily 90 capsule 1   gabapentin (NEURONTIN) 800 MG tablet Take 1 tablet (800 mg total) by mouth at bedtime. 90 tablet 1   Lemborexant (DAYVIGO) 10 MG  TABS Take 1 tablet (10 mg total) by mouth at bedtime. 30 tablet 2   MAGNESIUM PO Take by mouth.     Methylphenidate (COTEMPLA XR-ODT) 17.3 MG TBED Take 1 tablet by mouth every morning.     methylphenidate (DAYTRANA) 30 MG/9HR Place 1  patch onto the skin daily. wear patch for 9 hours only each day (Patient not taking: Reported on 08/19/2022) 30 patch 0   Multiple Vitamin (MULTIVITAMIN) tablet Take 1 tablet by mouth daily.     sertraline (ZOLOFT) 100 MG tablet Take 2 tablets (200 mg total) by mouth daily. 180 tablet 0   traZODone (DESYREL) 100 MG tablet Take 1 tablet (100 mg total) by mouth at bedtime. Take 1-2 tabs po QHS prn 180 tablet 1   valACYclovir (VALTREX) 500 MG tablet Take one twice daily for 3 days with any symptoms 30 tablet 3   No current facility-administered medications on file prior to visit.    LMP 12/27/2007 (Exact Date)       Objective:   Physical Exam Vitals and nursing note reviewed.  Constitutional:      Appearance: Normal appearance.  Skin:    General: Skin is warm and dry.  Neurological:     General: No focal deficit present.     Mental Status: She is alert and oriented to person, place, and time.  Psychiatric:        Mood and Affect: Mood normal.        Behavior: Behavior normal.        Thought Content: Thought content normal.        Judgment: Judgment normal.        Assessment & Plan:  1. Elevated liver enzymes - Will place referral to [facility]  - Ambulatory referral to Gastroenterology - Follow up as needed  Shirline Frees, NP

## 2022-08-22 NOTE — Telephone Encounter (Signed)
Spoke with Dr. Jennelle Human and he reports pt will have to wait till he can see her in person to discuss and explain his next recommendation.

## 2022-08-22 NOTE — Telephone Encounter (Signed)
Pt picked up her Daytrana Patches after her treatment yesterday, 08/21/22 and tried them yesterday and today and reports no improvement with her mood. She mentions Dr. Jennelle Human told her if they didn't work he would send in a Rx that increases her dopamine level. Informed her I would review her chart and send message to Dr. Jennelle Human but didn't know how quick something would be sent in.

## 2022-08-22 NOTE — Telephone Encounter (Signed)
Do not send yet. Do not need.

## 2022-08-22 NOTE — Telephone Encounter (Signed)
Pt no longer wants to have mammogram done in [place], she would like to have it done here at [facility]. Advised order placed and she could call to schedule.

## 2022-08-25 ENCOUNTER — Encounter: Payer: Self-pay | Admitting: Psychiatry

## 2022-08-25 ENCOUNTER — Ambulatory Visit (INDEPENDENT_AMBULATORY_CARE_PROVIDER_SITE_OTHER): Payer: 59 | Admitting: Psychiatry

## 2022-08-25 ENCOUNTER — Ambulatory Visit: Payer: 59

## 2022-08-25 VITALS — BP 102/70 | HR 70

## 2022-08-25 DIAGNOSIS — F339 Major depressive disorder, recurrent, unspecified: Secondary | ICD-10-CM

## 2022-08-25 DIAGNOSIS — Z6379 Other stressful life events affecting family and household: Secondary | ICD-10-CM

## 2022-08-25 DIAGNOSIS — F411 Generalized anxiety disorder: Secondary | ICD-10-CM | POA: Diagnosis not present

## 2022-08-25 DIAGNOSIS — F900 Attention-deficit hyperactivity disorder, predominantly inattentive type: Secondary | ICD-10-CM | POA: Diagnosis not present

## 2022-08-25 DIAGNOSIS — G4700 Insomnia, unspecified: Secondary | ICD-10-CM | POA: Diagnosis not present

## 2022-08-25 MED ORDER — PRAMIPEXOLE DIHYDROCHLORIDE 0.25 MG PO TABS: ORAL_TABLET | ORAL | 0 refills | Status: DC

## 2022-08-25 NOTE — Progress Notes (Signed)
NURSES NOTE:     Patient arrived for her 28th  Spravato treatment. Pt is being treated for Treatment Resistant Depression, the starting dose is 56 mg (2 of the 28 mg) nasal sprays. Pt will be receiving 84 mg (3 of the 28 mg) nasal sprays, this is her maintenance dose. Patient taken to treatment room, I explained and discussed her treatment and how the schedule should go, along with any side effects that may occur. Answered any questions and concerns the patient had. Pt's Spravato is delivered through [place] and she is now billing with her insurance. Spravato medication is stored at doctors office per REMS/FDA guidelines. The medication is required to be locked behind two doors per FDA/REMS Protocol. Medication is also disposed of properly per regulations. All treatments and her vital signs are documented in Spravato [facility] per protocol of treatment center regulations.      Pt taken to treatment room, vital Signs assessed at 12:55 PM 150/86, pulse 60, pulse ox 92%. Instructed pt to blow her nose and recline back slightly to prevent any of medication dripping out of her nose.  Pt. Given 1st dose (28 mg inhaler), then 5 minutes between 2nd dose and 3rd dose. No complaints of nausea/vomiting reported. Pt did listen to music today, she just reclined back and closed her eyes. After 40 minutes I went to assess her vital signs, no side effects or symptoms reported, B/P 125/68, pulse 65. Informed pt could rest in the chair Dr. Jennelle Human came in to discuss her treatment at the end. She will continue twice a week Spravato treatments. Nurse was with pt a total of 60 minutes but pt observed for 120 minutes per protocol. Discharge vital signs were at 2:50 PM, B/P 102/70, pulse 70. She reports dissociation but she was clear upon her discharge. She scheduled her next apt  May 1st.. Advised to contact me with any concerns or issues prior to her apt next week.    LOT # [id number] EXP FEB 2027

## 2022-08-25 NOTE — Progress Notes (Signed)
Heather Davila [id number] Dec 27, 1960 62 y.o.  Subjective:   Patient ID:  Heather Davila is a 61 y.o. (DOB 1961/03/08) female.  Chief Complaint:  Chief Complaint  Patient presents with   Follow-up   Depression   Anxiety   Sleeping Problem   Stress    HPI Heather Davila presents to the office today for follow-up of treatment resistant recurrent depression, generalized anxiety disorder and insomnia.  Almost too numerous to count med failures. She is here for Spravato administration for her treatment resistant depression.  05/21/22 appt noted: Patient was administered first dose Spravato 56 mg intranasally today.  The patient experienced the typical dissociation which gradually resolved over the 2-hour period of observation.  There were no complications.  Specifically the patient did not have nausea or vomiting or headache.   Dissociation was mild. Did not experience any emotional relief from disabling depression.wants to increase Spravato to the usual dose.  She is aware insurance is not covering the costs at this time.  No SI acutely. Tolerating meds.    05/23/22 appt noted: Patient was administered Spravato 84 mg intranasally today.  The patient experienced the typical dissociation which gradually resolved over the 2-hour period of observation.  There were no complications.  Specifically the patient did not have nausea or vomiting.   Dissociation was greater than last dose and a little bothersome DT some HA. Tolerating meds.   Mood has not changed significantly thus far with Spravato.  05/27/22 appt noted: Patient was administered Spravato 84 mg intranasally today.  The patient experienced the typical dissociation which gradually resolved over the 2-hour period of observation.  There were no complications.  Specifically the patient did not have nausea or vomiting or headache. Today she has felt a little relief from the pressing heaviness of depression but a little more anxiety.  During  administration of Spravato she listens to [place] music and was able to relax a little more with the feeling of dissociation that was mildly bothersome. Husband was also in on the session today and asked some questions about IV ketamine versus nasal spray ketamine.  05/29/22 appt noted: Cancelled today DT hypertension  05/30/22 appt noted: Patient was administered Spravato 84 mg intranasally today.  The patient experienced the typical dissociation which gradually resolved over the 2-hour period of observation.  There were no complications.  Specifically the patient did not have nausea or vomiting or headache. She is seeing some improvement in mood with less continuous depression and less intensity.  Hopefulness is better.   No med complaints or SE  06/05/22 appt noted: Patient was administered Spravato 84 mg intranasally today.  The patient experienced the typical dissociation which gradually resolved over the 2-hour period of observation.  There were no complications.  Specifically the patient did not have nausea or vomiting or headache.  Specifically HA is less of a problem with Spravato. No SE with meds. Depression is improving with less intensity.  Intermittent anxiety easily.  More able to enjoy things.  No new concerns.  06/17/22 appt noted: Patient was administered Spravato 84 mg intranasally today.  The patient experienced the typical dissociation which gradually resolved over the 2-hour period of observation.  There were no complications.  Specifically the patient did not have nausea or vomiting or headache.  Specifically HA is less of a problem with Spravato. No SE with meds. Depression is improved 45% with less intensity.  Intermittent anxiety less easily.  More able to enjoy things.  No new concerns.  More  active. Current meds: increased clonazepam to about 1.5 mg HS DT recent insomnia, lexapro 20, Dayvigo 10 mg HS, concerta 36 mg AM, trazodone 100 mg HS.  06/20/22 appt noted: Current  meds: increased clonazepam to about 1.5 mg HS DT recent insomnia, lexapro 20, Dayvigo 10 mg HS, concerta 36 mg AM, trazodone 100 mg HS. Patient was administered Spravato 84 mg intranasally today.  The patient experienced the typical dissociation which gradually resolved over the 2-hour period of observation.  There were no complications.  Specifically the patient did not have nausea or vomiting or headache.  Specifically HA is less of a problem with Spravato. No SE with meds. Depression is improved 45% with less intensity.  Intermittent anxiety less easily.  More able to enjoy things.  No new concerns.  More active.  06/23/22 appt noted:  Current meds: increased clonazepam to about 1.5 mg HS DT recent insomnia, lexapro 20, Dayvigo 10 mg HS, concerta 36 mg AM, trazodone 100 mg HS. Patient was administered Spravato 84 mg intranasally today.  The patient experienced the typical dissociation which gradually resolved over the 2-hour period of observation.  There were no complications.  Specifically the patient did not have nausea or vomiting or headache.  Specifically HA is less of a problem with Spravato. No SE with meds. Intensity of Spravato dissociation varies.  Last week very intesne and this week more typical.  Depression is continuing to improve with better interest and activity and motivation.  Gradual improvement.  Wants to continue.  06/25/22 appt noted: Current meds: increased clonazepam to about 1.5 mg HS DT recent insomnia, lexapro 20, Dayvigo 10 mg HS, concerta 36 mg AM, trazodone 100 mg HS. Patient was administered Spravato 84 mg intranasally today.  The patient experienced the typical dissociation which gradually resolved over the 2-hour period of observation.  There were no complications.  Specifically the patient did not have nausea or vomiting or headache.  Specifically HA is less of a problem with Spravato. No SE with meds. Intensity of Spravato dissociation varies.  No scary and well  tolerated. Continues to gradually improve with Spravato.  She is more motivated and enjoying things more.  H sees progress.  Wants to continue twice weekly until gets maximum response.  Dep is not gone yet.  06/30/22 appt noted:  seen with H Current meds: increased clonazepam to about 1.5 mg HS DT recent insomnia, lexapro 20, Dayvigo 10 mg HS, concerta 36 mg AM, trazodone 100 mg HS. Patient was administered Spravato 84 mg intranasally today.  The patient experienced the typical dissociation which gradually resolved over the 2-hour period of observation.  There were no complications.  Specifically the patient did not have nausea or vomiting or headache.  Specifically HA is less of a problem with Spravato. No SE with meds. Intensity of Spravato dissociation varies.  No scary and well tolerated. Continues to gradually improve with Spravato.  She is more motivated and enjoying things more.  H sees even more progress than she does; more active and smiling.  Wants to continue twice weekly until gets maximum response.  Dep is 80% better.  07/07/22 appt noted: Current meds: increased clonazepam to about 1.5 mg HS DT recent insomnia, lexapro 20, Dayvigo 10 mg HS, concerta 36 mg AM, trazodone 100 mg HS. Patient was administered Spravato 84 mg intranasally today.  The patient experienced the typical dissociation which gradually resolved over the 2-hour period of observation.  There were no complications.  Specifically the patient did not have  nausea or vomiting or headache.  Specifically HA is less of a problem with Spravato. No SE with meds. Intensity of Spravato dissociation varies.  No scary and well tolerated.  Was more intese today. Overall mood is markedly better and please with meds. No changes desired.  07/09/22 appt noted: In transition from Lexapro to sertraline and missing Concerta bc CO crash from it after about 4 hours.  Disc these concerns.  She'll discuss also with Corie Chiquito, NP More dep the  last couple of days and concerned about reducing Spravato frequency while transition from one antidep to another.  Affect less positive. Patient was administered Spravato 84 mg intranasally today.  The patient experienced the typical dissociation which gradually resolved over the 2-hour period of observation.  There were no complications.  Specifically the patient did not have nausea or vomiting or headache.  Specifically HA is less of a problem with Spravato. No SE with meds. Intensity of Spravato dissociation varies.  No scary and well tolerated.   07/14/22 appt noted: Has been more depressed this week.  More trouble with sleep.  Taking clonazepam 1-1.5  of the 0.5 mg tablets.   Trazodone not helping much. She switched back from 200 sertraline to Lexapro 20.  No SE More trouble with motivation.  Some stress with son with special needs. Misses the benevit of Concerta but it was too short acting and crashed in 4-6 hours. Patient was administered Spravato 84 mg intranasally today.  The patient experienced the typical dissociation which gradually resolved over the 2-hour period of observation.  There were no complications.  Specifically the patient did not have nausea or vomiting or headache.  Specifically HA is less of a problem with Spravato. No SE with meds. Intensity of Spravato dissociation varies.  No scary and well tolerated.   07/17/22 appt noted: Patient was administered Spravato 84 mg intranasally today.  The patient experienced the typical dissociation which gradually resolved over the 2-hour period of observation.  There were no complications.  Specifically the patient did not have nausea or vomiting or headache.  Specifically HA is less of a problem with Spravato. No SE with meds. Intensity of Spravato dissociation varies.  Not scary and well tolerated.  Just got Jornay last night but wasn't sure how to take it.  Wants to start and this was discussed.  She felt MPH helped mood and attn but  didn't last long enough and had crash after a few hours on Concerta.  Better for 2 hours after each dose Ritalin 20 mg with mood but then it wore off.  BP up after 3rd dose 160/90 and then took H's amlodipine 5 and BP became normal.  07/22/22 appt noted: Patient was administered Spravato 84 mg intranasally today.  The patient experienced the typical dissociation which gradually resolved over the 2-hour period of observation.  There were no complications.  Specifically the patient did not have nausea or vomiting or headache.  Specifically HA is less of a problem with Spravato. No SE with meds. Intensity of Spravato dissociation varies.  Not scary and well tolerated.  Start Jornay 20 mg over the weekend and did not notice any particular effect good or bad.  Increased to 40 mg. Other psych meds: Clonazepam 0.5 mg tablets 2 nightly, gabapentin dosage varies, Dayvigo 10 mg nightly, Lexapro 20 mg daily, to trazodone 100 mg nightly  07/24/22 appt noted: Patient was administered Spravato 84 mg intranasally today.  The patient experienced the typical dissociation which gradually resolved  over the 2-hour period of observation.  There were no complications.  Specifically the patient did not have nausea or vomiting or headache.  Specifically HA is less of a problem with Spravato. No SE with meds. Intensity of Spravato dissociation varies.  Not scary and well tolerated.  Other psych meds: Clonazepam 0.5 mg tablets 2 nightly, gabapentin dosage varies, Dayvigo 10 mg nightly, Lexapro 20 mg daily, to trazodone 100 mg nightly, Jornay 40 mg PM Doing well with Spravato.  Noticed a little effect from the Jornay 40 without SE but would like to increase the dose for ADD and mood.  Sleeping well.  No new concerns.  Still more depressed than she was with th initial benefit of Spravato.  07/30/22 note:  Patient was administered Spravato 84 mg intranasally today.  The patient experienced the typical dissociation which gradually  resolved over the 2-hour period of observation.  There were no complications.  Specifically the patient did not have nausea or vomiting or headache.  Specifically HA is less of a problem with Spravato. Other psych meds: Clonazepam 0.5 mg tablets 2 nightly, gabapentin dosage varies, Dayvigo 10 mg nightly, Lexapro 20 mg daily, to trazodone 100 mg nightly, Jornay 60 mg PM Wants to increase Jornay to 80 mg PM to increase benefit for ADD and depression. No SE She has experienced a major family crisis that threatens to change her daily life from now on.  It has not triggered suicidal thoughts at this time but she is very afraid.  No desire to change medications.  08/04/22 appt noted" Patient was administered Spravato 84 mg intranasally today.  The patient experienced the typical dissociation which gradually resolved over the 2-hour period of observation.  There were no complications.  Specifically the patient did not have nausea or vomiting or headache.  Specifically HA is less of a problem with Spravato. Other psych meds: Clonazepam 0.5 mg tablets 2 nightly, gabapentin dosage varies, Dayvigo 10 mg nightly, Lexapro 20 mg daily, trazodone 100 mg nightly, Jornay 80 mg PM No SE No benefit or SE with increase Jormay 80 mg.  Says Concerta helped ADD and depression but not Jornay.  However duration was insufficient.  Still depressed and wants change to another form of stimulant.  No concerns with other meds. Continues with strong family stressors creating uncertainty about her future but no quick resolution. No SI Trial Cotempla 17.3 and DC Ritalin & Jornay 80  08/11/2022 appointment noted: Patient was administered Spravato 84 mg intranasally today.  The patient experienced the typical dissociation which gradually resolved over the 2-hour period of observation.  There were no complications.  Specifically the patient did not have nausea or vomiting or headache.  Specifically HA is less of a problem with Spravato now  vs when started it.. Other psych meds: Clonazepam 0.5 mg tablets 2 nightly, gabapentin dosage varies, Dayvigo 10 mg nightly, trazodone 100 mg nightly, cotempla 17.3, switch from Lexapro 20 to sertaline 15o mg . No noticeable effects sertraline from for depression but has seen some antianxiety effects from the sertraline.  No side effects noted.  No side effects with the other medicines.  Still is only getting very brief benefit 3 to 4 hours from Cotempla for ADD and mood.  She wants to try the Daytrana patch as we have discussed before. No SE  08/13/22 appt: Patient was administered Spravato 84 mg intranasally today.  The patient experienced the typical dissociation which gradually resolved over the 2-hour period of observation.  There were no  complications.  Specifically the patient did not have nausea or vomiting or headache.  Specifically HA is less of a problem with Spravato now vs when started it.. Other psych meds: Clonazepam 0.5 mg tablets 2 nightly, gabapentin dosage varies, Dayvigo 10 mg nightly, trazodone 100 mg nightly, cotempla 17.3, switch from Lexapro 20 to sertaline 15o mg . No noticeable effects sertraline from for depression but has seen some antianxiety effects from the sertraline.  No side effects noted.  No side effects with the other medicines.  Still is only getting very brief benefit 3 to 4 hours from Cotempla for ADD and mood.  She wants to try the Daytrana patch as we have discussed before.  Hasn't been able to get it yet. No SE Plan.  Continue to try to get Daytrana 30 AM  08/18/22 appt noted: Patient was administered Spravato 84 mg intranasally today.  The patient experienced the typical dissociation which gradually resolved over the 2-hour period of observation.  There were no complications.  Specifically the patient did not have nausea or vomiting or headache.  Specifically HA is less of a problem with Spravato now vs when started it.. Other psych meds: Clonazepam 0.5 mg  tablets 2 nightly, gabapentin dosage varies, Dayvigo 10 mg nightly, trazodone 100 mg nightly, cotempla 17.3, switch from Lexapro 20 to sertaline 15o mg . No noticeable effects sertraline from for depression but has seen some antianxiety effects from the sertraline.  No side effects noted.  No side effects with the other medicines.  Still is only getting very brief benefit 3 to 4 hours from Cotempla for ADD and mood.  She wants to try the Daytrana patch as we have discussed before.  Hasn't been able to get it yet.  Overall depression is some better than it was.   No SE Plan.  Continue to try to get Daytrana 30 AM  08/25/22 appt : No benefit Daytrana.  No SE.  Wants further med changes and interested In retrying pramipexole.   Tolerating meds. Patient was administered Spravato 84 mg intranasally today.  The patient experienced the typical dissociation which gradually resolved over the 2-hour period of observation.  There were no complications.  Specifically the patient did not have nausea or vomiting or headache.  Specifically HA is less of a problem with Spravato now vs when started it.. Other psych meds: Clonazepam 0.5 mg tablets 2 nightly, gabapentin dosage varies, Dayvigo 10 mg nightly, trazodone 100 mg nightly, , switch from Lexapro 20 to sertaline 15o mg . Daytrana 30 AM for a few days. Struggles with depression and no SI.  Reduced interest and mood and motivation and enjoyment and socialization.  AIMS    Flowsheet Row Video Visit from 01/03/2022 in [facility]  AIMS Total Score 0      ECT-MADRS    Flowsheet Row Clinical Support from 08/13/2022 in [facility] Video Visit from 04/29/2022 in [facility] Video Visit from 02/06/2022 in [facility]  MADRS Total Score 27 44 23      GAD-7    Flowsheet Row Office Visit from 08/22/2022 in [facility] [place]   Total GAD-7 Score 7      PHQ2-9    Flowsheet Row Office Visit from 08/22/2022 in [facility] [place] [facility] [place] Office Visit from 08/19/2022 in [facility] Video Visit from 02/06/2022 in [facility]  Office Visit from 11/16/2020 in [facility] for [facility] Office Visit from 04/19/2018 in [facility] Total Score 3 0 6 0 0  PHQ-9 Total Score 5 -- 14 -- 2        Past Psychiatric Medication Trials: Trintellix - Initially effective and then was not effective when re-started Paxil- Ineffective. Helped initially. Prozac-Insomnia Lexapro- Effective and then no longer as effective Zoloft- Initially effective and then not as effective.  Viibryd Cymbalta- Ineffective. Helped initially. Effexor XR- Effective, well tolerated. Pristiq- Increased anxiety at 150 mg daily Wellbutrin XL-Ineffective.May have increased anxiety. Amitriptyline Nortriptyline-Caused irritability, constipation Auvelity- ineffective  Abilify- Helpful but caused severe insomnia. Rexulti-Helpful but caused insomnia. Vraylar Seroquel - Ineffective Risperdal Olanzapine- Ineffective Latuda- Ineffective Caplyta  Klonopin- Effective Temazepam- Took during menopause Lunesta- Ineffective Ambien-Ineffective Sonata- Ineffective Dayvigo- partially effective Trazodone- Ineffective Remeron-Ineffective. Does not recall wt gain.   Adderall- Took once and had adverse effect. Concerta- Effective but only 4-6 hours Jornay 80 NR Metadate-Not as effective as Concerta Dexmethylphenidate- Not as effective as Concerta Azstarys- some side effects. Not as effective.  Ritalin short acting and SE anxiety & HTN @20  TID Cotemplay 17.3  Daytrana  Lamictal- May have had an adverse effects. "Felt weird." Reports worsening s/s.  Lithium Gabapentin Pramipexole- "felt  really weird."   ECT #20 with minimal response. H says it was partly helpful.  AIMS    Flowsheet Row Video Visit from 01/03/2022 in [facility]  AIMS Total Score 0      ECT-MADRS    Flowsheet Row Clinical Support from 08/13/2022 in [facility] Video Visit from 04/29/2022 in [facility] Video Visit from 02/06/2022 in [facility]  MADRS Total Score 27 44 23      GAD-7    Flowsheet Row Office Visit from 08/22/2022 in [facility] [place]  Total GAD-7 Score 7      PHQ2-9    Flowsheet Row Office Visit from 08/22/2022 in [facility] [place] Office Visit from 08/19/2022 in [facility] for [facility] at [facility] Video Visit from 02/06/2022 in [facility] Office Visit from 11/16/2020 in [facility] for [facility] [place] Office Visit from 04/19/2018 in [facility] Total Score 3 0 6 0 0  PHQ-9 Total Score 5 -- 14 -- 2        Review of Systems:  Review of Systems  Constitutional:  Positive for fatigue.  Cardiovascular:  Negative for chest pain and palpitations.  Neurological:  Negative for tremors and weakness.  Psychiatric/Behavioral:  Positive for dysphoric mood. Negative for decreased concentration. The patient is nervous/anxious.     Medications: I have reviewed the patient's current medications.  Current Outpatient Medications  Medication Sig Dispense Refill   clonazePAM (KLONOPIN) 0.5 MG tablet 2 tablets at night and 1 tablet as needed daily for panic anxiety 75 tablet 1   Esketamine HCl, 84 MG Dose, (SPRAVATO, 84 MG DOSE,) 28 MG/DEVICE SOPK USE 3 SPRAYS IN EACH NOSTRIL EVERY 3 DAYS 3 each 0   Estradiol (VAGIFEM) 10 MCG TABS vaginal tablet Place 1 tablet (10 mcg total) vaginally 2 (two) times a week.  24 tablet 3   gabapentin (NEURONTIN) 800 MG tablet Take 1 tablet (800 mg total) by mouth at bedtime. 90 tablet 1   Lemborexant (  DAYVIGO) 10 MG TABS Take 1 tablet (10 mg total) by mouth at bedtime. 30 tablet 2   MAGNESIUM PO Take by mouth.     methylphenidate (DAYTRANA) 30 MG/9HR Place 1 patch onto the skin daily. wear patch for 9 hours only each day     Multiple Vitamin (MULTIVITAMIN) tablet Take 1 tablet by mouth daily.     pramipexole (MIRAPEX) 0.25 MG tablet 1 twice daily for 5 days then 1and 1/2 tablets twice daily 90 tablet 0   sertraline (ZOLOFT) 100 MG tablet Take 2 tablets (200 mg total) by mouth daily. 180 tablet 0   traZODone (DESYREL) 100 MG tablet Take 1 tablet (100 mg total) by mouth at bedtime. Take 1-2 tabs po QHS prn 180 tablet 1   valACYclovir (VALTREX) 500 MG tablet Take one twice daily for 3 days with any symptoms 30 tablet 3   No current facility-administered medications for this visit.    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   TRANSAMINASES, SERUM, ELEVATED 08/14/2009    Past Medical History, Surgical history, Social history, and Family history were reviewed and updated as appropriate.   Please see review of systems for further details on the patient's review from today.   Objective:   Physical Exam:  LMP 12/27/2007 (Exact Date)   Physical Exam Constitutional:      General: She is not in acute distress. Musculoskeletal:        General: No deformity.  Neurological:     Mental Status: She is alert and oriented to person, place, and time.     Sensory: Sensory deficit present.      Coordination: Coordination normal.  Psychiatric:        Attention and Perception: Attention and perception normal. She does not perceive auditory hallucinations.        Mood and Affect: Mood is anxious and depressed. Affect is blunt. Affect is not labile.        Speech: Speech normal. Speech is not slurred.        Behavior: Behavior normal.        Thought Content: Thought content normal. Thought content is not delusional. Thought content does not include homicidal or suicidal ideation. Thought content does not include suicidal plan.        Cognition and Memory: Cognition and memory normal.        Judgment: Judgment normal.     Comments: Insight intact More down with stress and worried. Affect blunted still     Lab Review:     Component Value Date/Time   NA 138 04/22/2022 0932   K 4.1 04/22/2022 0932   CL 98 04/22/2022 0932   CO2 31 04/22/2022 0932   GLUCOSE 102 (H) 04/22/2022 0932   BUN 10 04/22/2022 0932   CREATININE 0.71 04/22/2022 0932   CREATININE 0.68 01/26/2020 0920   CALCIUM 10.5 04/22/2022 0932   PROT 8.3 04/22/2022 0932   ALBUMIN 5.3 (H) 04/22/2022 0932   AST 82 (H) 04/22/2022 0932   ALT 118 (H) 04/22/2022  0932   ALKPHOS 74 04/22/2022 0932   BILITOT 0.3 04/22/2022 0932   GFRNONAA >90 05/01/2014 1645   GFRAA >90 05/01/2014 1645       Component Value Date/Time   WBC 3.3 (L) 04/22/2022 0932   RBC 4.25 04/22/2022 0932   HGB 14.3 04/22/2022 0932   HGB 14.1 10/26/2014 1034   HCT 40.7 04/22/2022 0932   PLT 245.0 04/22/2022 0932   MCV 95.7 04/22/2022 0932   MCH 34.4 (H) 01/26/2020 0920   MCHC 35.1 04/22/2022 0932   RDW 13.2 04/22/2022 0932   LYMPHSABS 0.9 04/22/2022 0932   MONOABS 0.2 04/22/2022 0932   EOSABS 0.1 04/22/2022 0932   BASOSABS 0.0 04/22/2022 0932    No results found for: "POCLITH", "LITHIUM"   No results found for: "PHENYTOIN", "PHENOBARB", "VALPROATE", "CBMZ"   .res Assessment: Plan:    Jere was seen today for follow-up, depression,  anxiety, sleeping problem and stress.  Diagnoses and all orders for this visit:  Recurrent major depression resistant to treatment [facility])  Attention deficit hyperactivity disorder (ADHD), predominantly inattentive type  Generalized anxiety disorder  Insomnia, unspecified type  Stressful life event affecting family  Other orders -     pramipexole (MIRAPEX) 0.25 MG tablet; 1 twice daily for 5 days then 1and 1/2 tablets twice daily    Pt seen for TRD and administration of Spravato.  Patient was administered Spravato 84 mg intranasally today.  The patient experienced the typical dissociation which gradually resolved over the 2-hour period of observation.  There were no complications.  Specifically the patient did not have nausea or vomiting. But mild headache.  Blood pressures remained within normal ranges at the 40-minute and 2-hour follow-up intervals.  By the time the 2-hour observation period was met the patient was alert and oriented and able to exit without assistance.  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  See nursing note for further details.Tolerated the Spravato to 84mg  . Plan to continue twice daily to max response.  Regarding med options.  She has obviously been thoroughly tried on numerous medications.  The only antidepressant category that she has not taken is MAO inhibitors which are not compatible with Spravato.  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 Disc this with her previously.    We discussed the short-term risks associated with benzodiazepines including sedation and increased fall risk among others.  Discussed long-term side effect risk including dependence, potential withdrawal symptoms, and the potential eventual dose-related risk of  dementia.  But recent studies from 2020 dispute this association between benzodiazepines and dementia risk. Newer studies in 2020 do not support an association with dementia.  Less intense depression with Spravato but some relapse ? Re: stopping Concerta bc of the crash.    Continue sertaline to 200 mg daily.  It has helped anxiety bu tnot depression.    Easiest trial for insomnia, clonazepam 1 mg HS. trazodone doesn't work well.  No benefit Daytrana 30 AM nor benefit. DC and retry pramipexole and increase more slowly for TRD  Crisis intervention around the events that have occurred in her family that may change her life from this point forward.  She is managing with expected emotional distress but not suicidal thought.  She does not desire any further medication changes except noted.  FU twice weekly Spravato  until improvement maxes out then drop back to weekly.  She wants to continue twice weekly as long as possible.  Meredith Staggers, MD, DFAPA    Please see After Visit Summary for patient specific instructions.  Future Appointments  Date Time Provider Department Center  08/27/2022  8:00 AM CP-NURSE [facility] None  08/27/2022  9:00 AM Cottle, Steva Ready., MD [facility] None  09/11/2022  8:00 AM [facility] 1 [facility]  09/18/2023  8:15 AM Jerene Bears, MD [facility]    No orders of the defined types were placed in this encounter.   ------------------------------- T

## 2022-08-26 ENCOUNTER — Other Ambulatory Visit: Payer: Self-pay | Admitting: Psychiatry

## 2022-08-26 DIAGNOSIS — G4700 Insomnia, unspecified: Secondary | ICD-10-CM

## 2022-08-27 ENCOUNTER — Ambulatory Visit (INDEPENDENT_AMBULATORY_CARE_PROVIDER_SITE_OTHER): Payer: 59 | Admitting: Psychiatry

## 2022-08-27 ENCOUNTER — Ambulatory Visit: Payer: 59

## 2022-08-27 ENCOUNTER — Other Ambulatory Visit: Payer: Self-pay | Admitting: Psychiatry

## 2022-08-27 VITALS — BP 120/80 | HR 62

## 2022-08-27 DIAGNOSIS — G4700 Insomnia, unspecified: Secondary | ICD-10-CM

## 2022-08-27 DIAGNOSIS — F339 Major depressive disorder, recurrent, unspecified: Secondary | ICD-10-CM

## 2022-08-27 DIAGNOSIS — Z6379 Other stressful life events affecting family and household: Secondary | ICD-10-CM

## 2022-08-27 DIAGNOSIS — F900 Attention-deficit hyperactivity disorder, predominantly inattentive type: Secondary | ICD-10-CM | POA: Diagnosis not present

## 2022-08-27 DIAGNOSIS — F411 Generalized anxiety disorder: Secondary | ICD-10-CM

## 2022-08-27 NOTE — Progress Notes (Signed)
Heather Davila [id number] 09/20/1960 62 y.o.  Subjective:   Patient ID:  Heather Davila is a 62 y.o. (DOB Jan 16, 1961) female.  Chief Complaint:  Chief Complaint  Patient presents with   Follow-up   Depression   Anxiety   Stress    HPI Heather Davila presents to the office today for follow-up of treatment resistant recurrent depression, generalized anxiety disorder and insomnia.  Almost too numerous to count med failures. She is here for Spravato administration for her treatment resistant depression.  05/21/22 appt noted: Patient was administered first dose Spravato 56 mg intranasally today.  The patient experienced the typical dissociation which gradually resolved over the 2-hour period of observation.  There were no complications.  Specifically the patient did not have nausea or vomiting or headache.   Dissociation was mild. Did not experience any emotional relief from disabling depression.wants to increase Spravato to the usual dose.  She is aware insurance is not covering the costs at this time.  No SI acutely. Tolerating meds.    05/23/22 appt noted: Patient was administered Spravato 84 mg intranasally today.  The patient experienced the typical dissociation which gradually resolved over the 2-hour period of observation.  There were no complications.  Specifically the patient did not have nausea or vomiting.   Dissociation was greater than last dose and a little bothersome DT some HA. Tolerating meds.   Mood has not changed significantly thus far with Spravato.  05/27/22 appt noted: Patient was administered Spravato 84 mg intranasally today.  The patient experienced the typical dissociation which gradually resolved over the 2-hour period of observation.  There were no complications.  Specifically the patient did not have nausea or vomiting or headache. Today she has felt a little relief from the pressing heaviness of depression but a little more anxiety.  During administration of  Spravato she listens to [place] music and was able to relax a little more with the feeling of dissociation that was mildly bothersome. Husband was also in on the session today and asked some questions about IV ketamine versus nasal spray ketamine.  05/29/22 appt noted: Cancelled today DT hypertension  05/30/22 appt noted: Patient was administered Spravato 84 mg intranasally today.  The patient experienced the typical dissociation which gradually resolved over the 2-hour period of observation.  There were no complications.  Specifically the patient did not have nausea or vomiting or headache. She is seeing some improvement in mood with less continuous depression and less intensity.  Hopefulness is better.   No med complaints or SE  06/05/22 appt noted: Patient was administered Spravato 84 mg intranasally today.  The patient experienced the typical dissociation which gradually resolved over the 2-hour period of observation.  There were no complications.  Specifically the patient did not have nausea or vomiting or headache.  Specifically HA is less of a problem with Spravato. No SE with meds. Depression is improving with less intensity.  Intermittent anxiety easily.  More able to enjoy things.  No new concerns.  06/17/22 appt noted: Patient was administered Spravato 84 mg intranasally today.  The patient experienced the typical dissociation which gradually resolved over the 2-hour period of observation.  There were no complications.  Specifically the patient did not have nausea or vomiting or headache.  Specifically HA is less of a problem with Spravato. No SE with meds. Depression is improved 45% with less intensity.  Intermittent anxiety less easily.  More able to enjoy things.  No new concerns.  More active. Current meds: increased  clonazepam to about 1.5 mg HS DT recent insomnia, lexapro 20, Dayvigo 10 mg HS, concerta 36 mg AM, trazodone 100 mg HS.  06/20/22 appt noted: Current meds: increased  clonazepam to about 1.5 mg HS DT recent insomnia, lexapro 20, Dayvigo 10 mg HS, concerta 36 mg AM, trazodone 100 mg HS. Patient was administered Spravato 84 mg intranasally today.  The patient experienced the typical dissociation which gradually resolved over the 2-hour period of observation.  There were no complications.  Specifically the patient did not have nausea or vomiting or headache.  Specifically HA is less of a problem with Spravato. No SE with meds. Depression is improved 45% with less intensity.  Intermittent anxiety less easily.  More able to enjoy things.  No new concerns.  More active.  06/23/22 appt noted:  Current meds: increased clonazepam to about 1.5 mg HS DT recent insomnia, lexapro 20, Dayvigo 10 mg HS, concerta 36 mg AM, trazodone 100 mg HS. Patient was administered Spravato 84 mg intranasally today.  The patient experienced the typical dissociation which gradually resolved over the 2-hour period of observation.  There were no complications.  Specifically the patient did not have nausea or vomiting or headache.  Specifically HA is less of a problem with Spravato. No SE with meds. Intensity of Spravato dissociation varies.  Last week very intesne and this week more typical.  Depression is continuing to improve with better interest and activity and motivation.  Gradual improvement.  Wants to continue.  06/25/22 appt noted: Current meds: increased clonazepam to about 1.5 mg HS DT recent insomnia, lexapro 20, Dayvigo 10 mg HS, concerta 36 mg AM, trazodone 100 mg HS. Patient was administered Spravato 84 mg intranasally today.  The patient experienced the typical dissociation which gradually resolved over the 2-hour period of observation.  There were no complications.  Specifically the patient did not have nausea or vomiting or headache.  Specifically HA is less of a problem with Spravato. No SE with meds. Intensity of Spravato dissociation varies.  No scary and well  tolerated. Continues to gradually improve with Spravato.  She is more motivated and enjoying things more.  H sees progress.  Wants to continue twice weekly until gets maximum response.  Dep is not gone yet.  06/30/22 appt noted:  seen with H Current meds: increased clonazepam to about 1.5 mg HS DT recent insomnia, lexapro 20, Dayvigo 10 mg HS, concerta 36 mg AM, trazodone 100 mg HS. Patient was administered Spravato 84 mg intranasally today.  The patient experienced the typical dissociation which gradually resolved over the 2-hour period of observation.  There were no complications.  Specifically the patient did not have nausea or vomiting or headache.  Specifically HA is less of a problem with Spravato. No SE with meds. Intensity of Spravato dissociation varies.  No scary and well tolerated. Continues to gradually improve with Spravato.  She is more motivated and enjoying things more.  H sees even more progress than she does; more active and smiling.  Wants to continue twice weekly until gets maximum response.  Dep is 80% better.  07/07/22 appt noted: Current meds: increased clonazepam to about 1.5 mg HS DT recent insomnia, lexapro 20, Dayvigo 10 mg HS, concerta 36 mg AM, trazodone 100 mg HS. Patient was administered Spravato 84 mg intranasally today.  The patient experienced the typical dissociation which gradually resolved over the 2-hour period of observation.  There were no complications.  Specifically the patient did not have nausea or vomiting or  headache.  Specifically HA is less of a problem with Spravato. No SE with meds. Intensity of Spravato dissociation varies.  No scary and well tolerated.  Was more intese today. Overall mood is markedly better and please with meds. No changes desired.  07/09/22 appt noted: In transition from Lexapro to sertraline and missing Concerta bc CO crash from it after about 4 hours.  Disc these concerns.  She'll discuss also with Corie Chiquito, NP More dep the  last couple of days and concerned about reducing Spravato frequency while transition from one antidep to another.  Affect less positive. Patient was administered Spravato 84 mg intranasally today.  The patient experienced the typical dissociation which gradually resolved over the 2-hour period of observation.  There were no complications.  Specifically the patient did not have nausea or vomiting or headache.  Specifically HA is less of a problem with Spravato. No SE with meds. Intensity of Spravato dissociation varies.  No scary and well tolerated.   07/14/22 appt noted: Has been more depressed this week.  More trouble with sleep.  Taking clonazepam 1-1.5  of the 0.5 mg tablets.   Trazodone not helping much. She switched back from 200 sertraline to Lexapro 20.  No SE More trouble with motivation.  Some stress with son with special needs. Misses the benevit of Concerta but it was too short acting and crashed in 4-6 hours. Patient was administered Spravato 84 mg intranasally today.  The patient experienced the typical dissociation which gradually resolved over the 2-hour period of observation.  There were no complications.  Specifically the patient did not have nausea or vomiting or headache.  Specifically HA is less of a problem with Spravato. No SE with meds. Intensity of Spravato dissociation varies.  No scary and well tolerated.   07/17/22 appt noted: Patient was administered Spravato 84 mg intranasally today.  The patient experienced the typical dissociation which gradually resolved over the 2-hour period of observation.  There were no complications.  Specifically the patient did not have nausea or vomiting or headache.  Specifically HA is less of a problem with Spravato. No SE with meds. Intensity of Spravato dissociation varies.  Not scary and well tolerated.  Just got Jornay last night but wasn't sure how to take it.  Wants to start and this was discussed.  She felt MPH helped mood and attn but  didn't last long enough and had crash after a few hours on Concerta.  Better for 2 hours after each dose Ritalin 20 mg with mood but then it wore off.  BP up after 3rd dose 160/90 and then took H's amlodipine 5 and BP became normal.  07/22/22 appt noted: Patient was administered Spravato 84 mg intranasally today.  The patient experienced the typical dissociation which gradually resolved over the 2-hour period of observation.  There were no complications.  Specifically the patient did not have nausea or vomiting or headache.  Specifically HA is less of a problem with Spravato. No SE with meds. Intensity of Spravato dissociation varies.  Not scary and well tolerated.  Start Jornay 20 mg over the weekend and did not notice any particular effect good or bad.  Increased to 40 mg. Other psych meds: Clonazepam 0.5 mg tablets 2 nightly, gabapentin dosage varies, Dayvigo 10 mg nightly, Lexapro 20 mg daily, to trazodone 100 mg nightly  07/24/22 appt noted: Patient was administered Spravato 84 mg intranasally today.  The patient experienced the typical dissociation which gradually resolved over the 2-hour period  of observation.  There were no complications.  Specifically the patient did not have nausea or vomiting or headache.  Specifically HA is less of a problem with Spravato. No SE with meds. Intensity of Spravato dissociation varies.  Not scary and well tolerated.  Other psych meds: Clonazepam 0.5 mg tablets 2 nightly, gabapentin dosage varies, Dayvigo 10 mg nightly, Lexapro 20 mg daily, to trazodone 100 mg nightly, Jornay 40 mg PM Doing well with Spravato.  Noticed a little effect from the Jornay 40 without SE but would like to increase the dose for ADD and mood.  Sleeping well.  No new concerns.  Still more depressed than she was with th initial benefit of Spravato.  07/30/22 note:  Patient was administered Spravato 84 mg intranasally today.  The patient experienced the typical dissociation which gradually  resolved over the 2-hour period of observation.  There were no complications.  Specifically the patient did not have nausea or vomiting or headache.  Specifically HA is less of a problem with Spravato. Other psych meds: Clonazepam 0.5 mg tablets 2 nightly, gabapentin dosage varies, Dayvigo 10 mg nightly, Lexapro 20 mg daily, to trazodone 100 mg nightly, Jornay 60 mg PM Wants to increase Jornay to 80 mg PM to increase benefit for ADD and depression. No SE She has experienced a major family crisis that threatens to change her daily life from now on.  It has not triggered suicidal thoughts at this time but she is very afraid.  No desire to change medications.  08/04/22 appt noted" Patient was administered Spravato 84 mg intranasally today.  The patient experienced the typical dissociation which gradually resolved over the 2-hour period of observation.  There were no complications.  Specifically the patient did not have nausea or vomiting or headache.  Specifically HA is less of a problem with Spravato. Other psych meds: Clonazepam 0.5 mg tablets 2 nightly, gabapentin dosage varies, Dayvigo 10 mg nightly, Lexapro 20 mg daily, trazodone 100 mg nightly, Jornay 80 mg PM No SE No benefit or SE with increase Jormay 80 mg.  Says Concerta helped ADD and depression but not Jornay.  However duration was insufficient.  Still depressed and wants change to another form of stimulant.  No concerns with other meds. Continues with strong family stressors creating uncertainty about her future but no quick resolution. No SI Trial Cotempla 17.3 and DC Ritalin & Jornay 80  08/11/2022 appointment noted: Patient was administered Spravato 84 mg intranasally today.  The patient experienced the typical dissociation which gradually resolved over the 2-hour period of observation.  There were no complications.  Specifically the patient did not have nausea or vomiting or headache.  Specifically HA is less of a problem with Spravato now  vs when started it.. Other psych meds: Clonazepam 0.5 mg tablets 2 nightly, gabapentin dosage varies, Dayvigo 10 mg nightly, trazodone 100 mg nightly, cotempla 17.3, switch from Lexapro 20 to sertaline 15o mg . No noticeable effects sertraline from for depression but has seen some antianxiety effects from the sertraline.  No side effects noted.  No side effects with the other medicines.  Still is only getting very brief benefit 3 to 4 hours from Cotempla for ADD and mood.  She wants to try the Daytrana patch as we have discussed before. No SE  08/13/22 appt: Patient was administered Spravato 84 mg intranasally today.  The patient experienced the typical dissociation which gradually resolved over the 2-hour period of observation.  There were no complications.  Specifically the  patient did not have nausea or vomiting or headache.  Specifically HA is less of a problem with Spravato now vs when started it.. Other psych meds: Clonazepam 0.5 mg tablets 2 nightly, gabapentin dosage varies, Dayvigo 10 mg nightly, trazodone 100 mg nightly, cotempla 17.3, switch from Lexapro 20 to sertaline 15o mg . No noticeable effects sertraline from for depression but has seen some antianxiety effects from the sertraline.  No side effects noted.  No side effects with the other medicines.  Still is only getting very brief benefit 3 to 4 hours from Cotempla for ADD and mood.  She wants to try the Daytrana patch as we have discussed before.  Hasn't been able to get it yet. No SE Plan.  Continue to try to get Daytrana 30 AM  08/18/22 appt noted: Patient was administered Spravato 84 mg intranasally today.  The patient experienced the typical dissociation which gradually resolved over the 2-hour period of observation.  There were no complications.  Specifically the patient did not have nausea or vomiting or headache.  Specifically HA is less of a problem with Spravato now vs when started it.. Other psych meds: Clonazepam 0.5 mg  tablets 2 nightly, gabapentin dosage varies, Dayvigo 10 mg nightly, trazodone 100 mg nightly, cotempla 17.3, switch from Lexapro 20 to sertaline 15o mg . No noticeable effects sertraline from for depression but has seen some antianxiety effects from the sertraline.  No side effects noted.  No side effects with the other medicines.  Still is only getting very brief benefit 3 to 4 hours from Cotempla for ADD and mood.  She wants to try the Daytrana patch as we have discussed before.  Hasn't been able to get it yet.  Overall depression is some better than it was.   No SE Plan.  Continue to try to get Daytrana 30 AM  08/25/22 appt : No benefit Daytrana.  No SE.  Wants further med changes and interested In retrying pramipexole.   Tolerating meds. Patient was administered Spravato 84 mg intranasally today.  The patient experienced the typical dissociation which gradually resolved over the 2-hour period of observation.  There were no complications.  Specifically the patient did not have nausea or vomiting or headache.  Specifically HA is less of a problem with Spravato now vs when started it.. Other psych meds: Clonazepam 0.5 mg tablets 2 nightly, gabapentin dosage varies, Dayvigo 10 mg nightly, trazodone 100 mg nightly, , switch from Lexapro 20 to sertaline 15o mg . Daytrana 30 AM for a few days. Struggles with depression and no SI.  Reduced interest and mood and motivation and enjoyment and socialization.  08/27/22 appt noted: Psych meds:  Clonazepam 0.5 mg tablets 2 nightly, gabapentin dosage varies, Dayvigo 10 mg nightly, trazodone 100 mg nightly,  sertaline 15o mg . Returned to [employer] AM, pramipexole  Tolerating meds. Patient was administered Spravato 84 mg intranasally today.  The patient experienced the typical dissociation which gradually resolved over the 2-hour period of observation.  There were no complications.  Specifically the patient did not have nausea or vomiting or headache.   Reports  taking cotempla bc brief mood benefit and sig focus and productivity benefit. Took 1 dose pramipexole and felt more dep.  AIMS    Flowsheet Row Video Visit from 01/03/2022 in [facility]  AIMS Total Score 0      ECT-MADRS    Flowsheet Row Clinical Support from 08/13/2022 in [facility] Video Visit from  04/29/2022 in [facility] Video Visit from 02/06/2022 in [facility]  MADRS Total Score 27 44 23      GAD-7    Flowsheet Row Office Visit from 08/22/2022 in [facility] [place]  Total GAD-7 Score 7      PHQ2-9    Flowsheet Row Office Visit from 08/22/2022 in [facility] [place] Office Visit from 08/19/2022 in [facility] at [facility] Video Visit from 02/06/2022 in [facility] Office Visit from 11/16/2020 in [facility] [place] Office Visit from 04/19/2018 in [facility] Total Score 3 0 6 0 0  PHQ-9 Total Score 5 -- 14 -- 2        Past Psychiatric Medication Trials: Trintellix - Initially effective and then was not effective when re-started Paxil- Ineffective. Helped initially. Prozac-Insomnia Lexapro- Effective and then no longer as effective Zoloft- Initially effective and then not as effective.  Viibryd Cymbalta- Ineffective. Helped initially. Effexor XR- Effective, well tolerated. Pristiq- Increased anxiety at 150 mg daily Wellbutrin XL-Ineffective.May have increased anxiety. Amitriptyline Nortriptyline-Caused irritability, constipation Auvelity- ineffective  Abilify- Helpful but caused severe insomnia. Rexulti-Helpful but caused insomnia. Vraylar Seroquel - Ineffective Risperdal Olanzapine- Ineffective Latuda- Ineffective Caplyta  Klonopin-  Effective Temazepam- Took during menopause Lunesta- Ineffective Ambien-Ineffective Sonata- Ineffective Dayvigo- partially effective Trazodone- Ineffective Remeron-Ineffective. Does not recall wt gain.   Adderall- Took once and had adverse effect. Concerta- Effective but only 4-6 hours Jornay 80 NR Metadate-Not as effective as Concerta Dexmethylphenidate- Not as effective as Concerta Azstarys- some side effects. Not as effective.  Ritalin short acting and SE anxiety & HTN @20  TID Cotemplay 17.3  Daytrana  Lamictal- May have had an adverse effects. "Felt weird." Reports worsening s/s.  Lithium Gabapentin Pramipexole- "felt really weird."   ECT #20 with minimal response. H says it was partly helpful.  AIMS    Flowsheet Row Video Visit from 01/03/2022 in [facility]  AIMS Total Score 0      ECT-MADRS    Flowsheet Row Clinical Support from 08/13/2022 in [facility] Video Visit from 04/29/2022 in [facility] Video Visit from 02/06/2022 in [facility]  MADRS Total Score 27 44 23      GAD-7    Flowsheet Row Office Visit from 08/22/2022 in [facility] [place]  Total GAD-7 Score 7      PHQ2-9    Flowsheet Row Office Visit from 08/22/2022 in [facility] [place] Office Visit from 08/19/2022 in [facility] Video Visit from 02/06/2022 in [facility] Office Visit from 11/16/2020 in [facility] [place] Office Visit from 04/19/2018 in [facility] Total Score 3 0 6 0 0  PHQ-9 Total Score 5 -- 14 -- 2        Review of Systems:  Review of Systems  Constitutional:  Positive for fatigue.  Cardiovascular:  Negative for chest pain and palpitations.  Neurological:   Negative for tremors and weakness.  Psychiatric/Behavioral:  Positive for dysphoric mood. Negative for decreased concentration. The patient is nervous/anxious.     Medications: I have reviewed the patient's current medications.  Current Outpatient Medications  Medication Sig Dispense  Refill   clonazePAM (KLONOPIN) 0.5 MG tablet 2 tablets at night and 1 tablet as needed daily for panic anxiety 75 tablet 1   Esketamine HCl, 84 MG Dose, (SPRAVATO, 84 MG DOSE,) 28 MG/DEVICE SOPK USE 3 SPRAYS IN EACH NOSTRIL EVERY 3 DAYS 3 each 2   Estradiol (VAGIFEM) 10 MCG TABS vaginal tablet Place 1 tablet (10 mcg total) vaginally 2 (two) times a week. 24 tablet 3   gabapentin (NEURONTIN) 800 MG tablet Take 1 tablet (800 mg total) by mouth at bedtime. 90 tablet 1   Lemborexant (DAYVIGO) 10 MG TABS Take 1 tablet (10 mg total) by mouth at bedtime. 30 tablet 2   MAGNESIUM PO Take by mouth.     Methylphenidate (COTEMPLA XR-ODT) 17.3 MG TBED Take by mouth.     pramipexole (MIRAPEX) 0.25 MG tablet 1 twice daily for 5 days then 1and 1/2 tablets twice daily 90 tablet 0   sertraline (ZOLOFT) 100 MG tablet Take 2 tablets (200 mg total) by mouth daily. 180 tablet 0   traZODone (DESYREL) 100 MG tablet Take 1 tablet (100 mg total) by mouth at bedtime. Take 1-2 tabs po QHS prn 180 tablet 1   valACYclovir (VALTREX) 500 MG tablet Take one twice daily for 3 days with any symptoms 30 tablet 3   Multiple Vitamin (MULTIVITAMIN) tablet Take 1 tablet by mouth daily. (Patient not taking: Reported on 08/27/2022)     No current facility-administered medications for this visit.    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   TRANSAMINASES, SERUM, ELEVATED 08/14/2009    Past Medical History, Surgical history, Social history, and Family history were reviewed and updated as appropriate.   Please see review of systems for further details on the patient's review from today.   Objective:   Physical Exam:  LMP 12/27/2007 (Exact Date)   Physical Exam Constitutional:      General: She is not in acute distress. Musculoskeletal:        General: No deformity.  Neurological:     Mental Status: She is alert and oriented to person, place, and time.     Sensory: Sensory deficit present.     Coordination: Coordination normal.  Psychiatric:        Attention and Perception: Attention and perception normal. She does not perceive auditory hallucinations.        Mood and Affect: Mood is anxious and depressed. Affect is blunt. Affect is not labile.        Speech: Speech normal. Speech is not slurred.        Behavior: Behavior normal.        Thought Content: Thought content normal. Thought content is not delusional. Thought content does not include homicidal or suicidal ideation. Thought content does not include suicidal plan.        Cognition and Memory: Cognition and memory normal.        Judgment: Judgment normal.     Comments: Insight intact More down with stress and worried. negative Affect blunted still  Lab Review:     Component Value Date/Time   NA 138 04/22/2022 0932   K 4.1 04/22/2022 0932   CL 98 04/22/2022 0932   CO2 31 04/22/2022 0932   GLUCOSE 102 (H) 04/22/2022 0932   BUN 10 04/22/2022 0932   CREATININE 0.71 04/22/2022 0932   CREATININE 0.68 01/26/2020 0920   CALCIUM 10.5 04/22/2022 0932   PROT 8.3 04/22/2022 0932   ALBUMIN 5.3 (H) 04/22/2022 0932   AST 82 (H) 04/22/2022 0932   ALT 118 (H) 04/22/2022 0932   ALKPHOS 74 04/22/2022 0932   BILITOT 0.3 04/22/2022 0932   GFRNONAA >90 05/01/2014 1645   GFRAA >90 05/01/2014  1645       Component Value Date/Time   WBC 3.3 (L) 04/22/2022 0932   RBC 4.25 04/22/2022 0932   HGB 14.3 04/22/2022 0932   HGB 14.1 10/26/2014 1034   HCT 40.7 04/22/2022 0932   PLT 245.0 04/22/2022 0932   MCV 95.7 04/22/2022 0932   MCH 34.4 (H) 01/26/2020 0920   MCHC 35.1 04/22/2022 0932   RDW 13.2 04/22/2022 0932   LYMPHSABS 0.9 04/22/2022 0932   MONOABS 0.2 04/22/2022 0932   EOSABS 0.1 04/22/2022 0932   BASOSABS 0.0 04/22/2022 0932    No results found for: "POCLITH", "LITHIUM"   No results found for: "PHENYTOIN", "PHENOBARB", "VALPROATE", "CBMZ"   .res Assessment: Plan:    Danira was seen today for follow-up, depression, anxiety and stress.  Diagnoses and all orders for this visit:  Recurrent major depression resistant to treatment [facility])  Generalized anxiety disorder  Attention deficit hyperactivity disorder (ADHD), predominantly inattentive type  Insomnia, unspecified type  Stressful life event affecting family     Pt seen for TRD and administration of Spravato.  Patient was administered Spravato 84 mg intranasally today.  The patient experienced the typical dissociation which gradually resolved over the 2-hour period of observation.  There were no complications.  Specifically the patient did not have nausea or vomiting. But mild headache.  Blood pressures remained within normal ranges at the 40-minute and 2-hour follow-up intervals.  By the time the 2-hour observation period was met the patient was alert and oriented and able to exit without assistance.  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  See nursing note for further details.Tolerated the Spravato to 84mg  . Plan to continue twice daily to max response.  Regarding med options.  She has obviously been thoroughly tried on numerous medications.  The only antidepressant category that she has not taken is MAO inhibitors  which are not compatible with Spravato.  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 Disc this with her previously.    We discussed the short-term risks associated with benzodiazepines including sedation and increased fall risk among others.  Discussed long-term side effect risk including dependence, potential withdrawal symptoms, and the potential eventual dose-related risk of dementia.  But recent studies from 2020 dispute this association between benzodiazepines and dementia risk. Newer studies in 2020 do not support an association with dementia.  Less intense depression with Spravato but some relapse ? Re: stopping Concerta bc of the crash.    Continue sertaline to 200 mg daily.  It has helped anxiety bu tnot depression.    Easiest trial for insomnia, clonazepam 1 mg HS. trazodone doesn't work well.  She wants  to continue Cotempla or Concerta bc helps mood early in day and focus  benefit lasts longer.  Have not been able to get stimulant to be consitently effective throughout the day.  continue pramipexole trial off-label and increase more slowly for TRD.  1 dose is not sufficient trial.  Crisis intervention around the events that have occurred in her family that may change her life from this point forward.  She is managing with expected emotional distress but not suicidal thought.  She does not desire any further medication changes except noted.  FU twice weekly Spravato until improvement maxes out then drop back to weekly.  She wants to continue twice weekly as long as possible.  Meredith Staggers, MD, DFAPA    Please see After Visit Summary for patient specific instructions.  Future Appointments  Date Time Provider Department Center  09/01/2022  1:00 PM Cottle, Steva Ready., MD [facility] None  09/01/2022  1:00 PM CP-NURSE [facility] None  09/03/2022  8:00 AM CP-NURSE  CP-CP None  09/03/2022  9:00 AM Cottle, Steva Ready., MD [facility] None  09/08/2022  1:00 PM Cottle, Steva Ready., MD [facility] None  09/08/2022  1:00 PM CP-NURSE CP-[facility] None  09/11/2022  8:00 AM [facility] 1 [facility]  09/11/2022  9:00 AM Cottle, Steva Ready., MD [facility] None  09/11/2022  9:00 AM CP-NURSE [facility] None  09/15/2022  1:00 PM Cottle, Steva Ready., MD [facility] None  09/15/2022  1:00 PM CP-NURSE CP-CP None  09/17/2022  8:00 AM CP-NURSE CP-[facility] None  09/17/2022  9:00 AM Cottle, Steva Ready., MD [facility] None  09/23/2022  8:00 AM CP-NURSE CP-[facility] None  09/23/2022  9:00 AM Cottle, Steva Ready., MD CP[facility] None  09/25/2022  8:00 AM CP-NURSE CP-CP None  09/25/2022  9:00 AM Cottle, Steva Ready., MD [facility] None  09/18/2023  8:15 AM Jerene Bears, MD [facility]    No orders of the defined types were placed in this encounter.   ------------------------------- T

## 2022-08-27 NOTE — Progress Notes (Signed)
NURSES NOTE:     Patient arrived for her 29th  Spravato treatment. Pt is being treated for Treatment Resistant Depression, the starting dose is 56 mg (2 of the 28 mg) nasal sprays. Pt will be receiving 84 mg (3 of the 28 mg) nasal sprays, this is her maintenance dose. Patient taken to treatment room, I explained and discussed her treatment and how the schedule should go, along with any side effects that may occur. Answered any questions and concerns the patient had. Pt's Spravato is delivered through [place] and she is now billing with her insurance. Spravato medication is stored at doctors office per REMS/FDA guidelines. The medication is required to be locked behind two doors per FDA/REMS Protocol. Medication is also disposed of properly per regulations. All treatments and her vital signs are documented in Spravato [facility]S per protocol of treatment center regulations.      Pt taken to treatment room, vital Signs assessed at 8:00 AM 104/63, pulse 62, pulse ox 92%. Instructed pt to blow her nose and recline back slightly to prevent any of medication dripping out of her nose.  Pt. Given 1st dose (28 mg inhaler), then 5 minutes between 2nd dose and 3rd dose. No complaints of nausea/vomiting reported. Pt did listen to music today, she just reclined back and closed her eyes. After 40 minutes I went to assess her vital signs, no side effects or symptoms reported, B/P 118/75, pulse 64. Informed pt could rest in the chair Dr. Jennelle Human came in to discuss her treatment at the end. She will continue twice a week Spravato treatments. Nurse was with pt a total of 60 minutes but pt observed for 120 minutes per protocol. Discharge vital signs were at 9:53 AM, B/P 120/80, pulse 62. She reports dissociation but she was clear upon her discharge. She scheduled her next apt  May 6th.. Advised to contact me with any concerns or issues prior to her apt next week.    LOT # [id number] EXP FEB 2027

## 2022-08-29 ENCOUNTER — Other Ambulatory Visit: Payer: Self-pay | Admitting: Psychiatry

## 2022-08-29 DIAGNOSIS — F339 Major depressive disorder, recurrent, unspecified: Secondary | ICD-10-CM

## 2022-09-01 ENCOUNTER — Ambulatory Visit (INDEPENDENT_AMBULATORY_CARE_PROVIDER_SITE_OTHER): Payer: 59 | Admitting: Psychiatry

## 2022-09-01 ENCOUNTER — Ambulatory Visit: Payer: 59

## 2022-09-01 ENCOUNTER — Other Ambulatory Visit: Payer: Self-pay

## 2022-09-01 ENCOUNTER — Encounter: Payer: Self-pay | Admitting: Psychiatry

## 2022-09-01 VITALS — BP 143/77 | HR 58

## 2022-09-01 DIAGNOSIS — Z6379 Other stressful life events affecting family and household: Secondary | ICD-10-CM

## 2022-09-01 DIAGNOSIS — G4700 Insomnia, unspecified: Secondary | ICD-10-CM

## 2022-09-01 DIAGNOSIS — F339 Major depressive disorder, recurrent, unspecified: Secondary | ICD-10-CM

## 2022-09-01 DIAGNOSIS — F900 Attention-deficit hyperactivity disorder, predominantly inattentive type: Secondary | ICD-10-CM | POA: Diagnosis not present

## 2022-09-01 DIAGNOSIS — F411 Generalized anxiety disorder: Secondary | ICD-10-CM | POA: Diagnosis not present

## 2022-09-01 MED ORDER — COTEMPLA XR-ODT 17.3 MG PO TBED: 17.3000 mg | EXTENDED_RELEASE_TABLET | Freq: Every morning | ORAL | 0 refills | Status: DC

## 2022-09-01 NOTE — Progress Notes (Signed)
Heather Davila [id number] 18-May-1960 62 y.o.  Subjective:   Patient ID:  Heather Davila is a 62 y.o. (DOB 1961/01/11) female.  Chief Complaint:  Chief Complaint  Patient presents with   Follow-up   Depression   Anxiety   Stress   ADD    HPI Heather Davila presents to the office today for follow-up of treatment resistant recurrent depression, generalized anxiety disorder and insomnia.  Almost too numerous to count med failures. She is here for Spravato administration for her treatment resistant depression.  05/21/22 appt noted: Patient was administered first dose Spravato 56 mg intranasally today.  The patient experienced the typical dissociation which gradually resolved over the 2-hour period of observation.  There were no complications.  Specifically the patient did not have nausea or vomiting or headache.   Dissociation was mild. Did not experience any emotional relief from disabling depression.wants to increase Spravato to the usual dose.  She is aware insurance is not covering the costs at this time.  No SI acutely. Tolerating meds.    05/23/22 appt noted: Patient was administered Spravato 84 mg intranasally today.  The patient experienced the typical dissociation which gradually resolved over the 2-hour period of observation.  There were no complications.  Specifically the patient did not have nausea or vomiting.   Dissociation was greater than last dose and a little bothersome DT some HA. Tolerating meds.   Mood has not changed significantly thus far with Spravato.  05/27/22 appt noted: Patient was administered Spravato 84 mg intranasally today.  The patient experienced the typical dissociation which gradually resolved over the 2-hour period of observation.  There were no complications.  Specifically the patient did not have nausea or vomiting or headache. Today she has felt a little relief from the pressing heaviness of depression but a little more anxiety.  During administration  of Spravato she listens to [place] music and was able to relax a little more with the feeling of dissociation that was mildly bothersome. Husband was also in on the session today and asked some questions about IV ketamine versus nasal spray ketamine.  05/29/22 appt noted: Cancelled today DT hypertension  05/30/22 appt noted: Patient was administered Spravato 84 mg intranasally today.  The patient experienced the typical dissociation which gradually resolved over the 2-hour period of observation.  There were no complications.  Specifically the patient did not have nausea or vomiting or headache. She is seeing some improvement in mood with less continuous depression and less intensity.  Hopefulness is better.   No med complaints or SE  06/05/22 appt noted: Patient was administered Spravato 84 mg intranasally today.  The patient experienced the typical dissociation which gradually resolved over the 2-hour period of observation.  There were no complications.  Specifically the patient did not have nausea or vomiting or headache.  Specifically HA is less of a problem with Spravato. No SE with meds. Depression is improving with less intensity.  Intermittent anxiety easily.  More able to enjoy things.  No new concerns.  06/17/22 appt noted: Patient was administered Spravato 84 mg intranasally today.  The patient experienced the typical dissociation which gradually resolved over the 2-hour period of observation.  There were no complications.  Specifically the patient did not have nausea or vomiting or headache.  Specifically HA is less of a problem with Spravato. No SE with meds. Depression is improved 45% with less intensity.  Intermittent anxiety less easily.  More able to enjoy things.  No new concerns.  More active.  Current meds: increased clonazepam to about 1.5 mg HS DT recent insomnia, lexapro 20, Dayvigo 10 mg HS, concerta 36 mg AM, trazodone 100 mg HS.  06/20/22 appt noted: Current meds: increased  clonazepam to about 1.5 mg HS DT recent insomnia, lexapro 20, Dayvigo 10 mg HS, concerta 36 mg AM, trazodone 100 mg HS. Patient was administered Spravato 84 mg intranasally today.  The patient experienced the typical dissociation which gradually resolved over the 2-hour period of observation.  There were no complications.  Specifically the patient did not have nausea or vomiting or headache.  Specifically HA is less of a problem with Spravato. No SE with meds. Depression is improved 45% with less intensity.  Intermittent anxiety less easily.  More able to enjoy things.  No new concerns.  More active.  06/23/22 appt noted:  Current meds: increased clonazepam to about 1.5 mg HS DT recent insomnia, lexapro 20, Dayvigo 10 mg HS, concerta 36 mg AM, trazodone 100 mg HS. Patient was administered Spravato 84 mg intranasally today.  The patient experienced the typical dissociation which gradually resolved over the 2-hour period of observation.  There were no complications.  Specifically the patient did not have nausea or vomiting or headache.  Specifically HA is less of a problem with Spravato. No SE with meds. Intensity of Spravato dissociation varies.  Last week very intesne and this week more typical.  Depression is continuing to improve with better interest and activity and motivation.  Gradual improvement.  Wants to continue.  06/25/22 appt noted: Current meds: increased clonazepam to about 1.5 mg HS DT recent insomnia, lexapro 20, Dayvigo 10 mg HS, concerta 36 mg AM, trazodone 100 mg HS. Patient was administered Spravato 84 mg intranasally today.  The patient experienced the typical dissociation which gradually resolved over the 2-hour period of observation.  There were no complications.  Specifically the patient did not have nausea or vomiting or headache.  Specifically HA is less of a problem with Spravato. No SE with meds. Intensity of Spravato dissociation varies.  No scary and well  tolerated. Continues to gradually improve with Spravato.  She is more motivated and enjoying things more.  H sees progress.  Wants to continue twice weekly until gets maximum response.  Dep is not gone yet.  06/30/22 appt noted:  seen with H Current meds: increased clonazepam to about 1.5 mg HS DT recent insomnia, lexapro 20, Dayvigo 10 mg HS, concerta 36 mg AM, trazodone 100 mg HS. Patient was administered Spravato 84 mg intranasally today.  The patient experienced the typical dissociation which gradually resolved over the 2-hour period of observation.  There were no complications.  Specifically the patient did not have nausea or vomiting or headache.  Specifically HA is less of a problem with Spravato. No SE with meds. Intensity of Spravato dissociation varies.  No scary and well tolerated. Continues to gradually improve with Spravato.  She is more motivated and enjoying things more.  H sees even more progress than she does; more active and smiling.  Wants to continue twice weekly until gets maximum response.  Dep is 80% better.  07/07/22 appt noted: Current meds: increased clonazepam to about 1.5 mg HS DT recent insomnia, lexapro 20, Dayvigo 10 mg HS, concerta 36 mg AM, trazodone 100 mg HS. Patient was administered Spravato 84 mg intranasally today.  The patient experienced the typical dissociation which gradually resolved over the 2-hour period of observation.  There were no complications.  Specifically the patient did not have nausea  or vomiting or headache.  Specifically HA is less of a problem with Spravato. No SE with meds. Intensity of Spravato dissociation varies.  No scary and well tolerated.  Was more intese today. Overall mood is markedly better and please with meds. No changes desired.  07/09/22 appt noted: In transition from Lexapro to sertraline and missing Concerta bc CO crash from it after about 4 hours.  Disc these concerns.  She'll discuss also with Corie Chiquito, NP More dep the  last couple of days and concerned about reducing Spravato frequency while transition from one antidep to another.  Affect less positive. Patient was administered Spravato 84 mg intranasally today.  The patient experienced the typical dissociation which gradually resolved over the 2-hour period of observation.  There were no complications.  Specifically the patient did not have nausea or vomiting or headache.  Specifically HA is less of a problem with Spravato. No SE with meds. Intensity of Spravato dissociation varies.  No scary and well tolerated.   07/14/22 appt noted: Has been more depressed this week.  More trouble with sleep.  Taking clonazepam 1-1.5  of the 0.5 mg tablets.   Trazodone not helping much. She switched back from 200 sertraline to Lexapro 20.  No SE More trouble with motivation.  Some stress with son with special needs. Misses the benevit of Concerta but it was too short acting and crashed in 4-6 hours. Patient was administered Spravato 84 mg intranasally today.  The patient experienced the typical dissociation which gradually resolved over the 2-hour period of observation.  There were no complications.  Specifically the patient did not have nausea or vomiting or headache.  Specifically HA is less of a problem with Spravato. No SE with meds. Intensity of Spravato dissociation varies.  No scary and well tolerated.   07/17/22 appt noted: Patient was administered Spravato 84 mg intranasally today.  The patient experienced the typical dissociation which gradually resolved over the 2-hour period of observation.  There were no complications.  Specifically the patient did not have nausea or vomiting or headache.  Specifically HA is less of a problem with Spravato. No SE with meds. Intensity of Spravato dissociation varies.  Not scary and well tolerated.  Just got Jornay last night but wasn't sure how to take it.  Wants to start and this was discussed.  She felt MPH helped mood and attn but  didn't last long enough and had crash after a few hours on Concerta.  Better for 2 hours after each dose Ritalin 20 mg with mood but then it wore off.  BP up after 3rd dose 160/90 and then took H's amlodipine 5 and BP became normal.  07/22/22 appt noted: Patient was administered Spravato 84 mg intranasally today.  The patient experienced the typical dissociation which gradually resolved over the 2-hour period of observation.  There were no complications.  Specifically the patient did not have nausea or vomiting or headache.  Specifically HA is less of a problem with Spravato. No SE with meds. Intensity of Spravato dissociation varies.  Not scary and well tolerated.  Start Jornay 20 mg over the weekend and did not notice any particular effect good or bad.  Increased to 40 mg. Other psych meds: Clonazepam 0.5 mg tablets 2 nightly, gabapentin dosage varies, Dayvigo 10 mg nightly, Lexapro 20 mg daily, to trazodone 100 mg nightly  07/24/22 appt noted: Patient was administered Spravato 84 mg intranasally today.  The patient experienced the typical dissociation which gradually resolved over  the 2-hour period of observation.  There were no complications.  Specifically the patient did not have nausea or vomiting or headache.  Specifically HA is less of a problem with Spravato. No SE with meds. Intensity of Spravato dissociation varies.  Not scary and well tolerated.  Other psych meds: Clonazepam 0.5 mg tablets 2 nightly, gabapentin dosage varies, Dayvigo 10 mg nightly, Lexapro 20 mg daily, to trazodone 100 mg nightly, Jornay 40 mg PM Doing well with Spravato.  Noticed a little effect from the Jornay 40 without SE but would like to increase the dose for ADD and mood.  Sleeping well.  No new concerns.  Still more depressed than she was with th initial benefit of Spravato.  07/30/22 note:  Patient was administered Spravato 84 mg intranasally today.  The patient experienced the typical dissociation which gradually  resolved over the 2-hour period of observation.  There were no complications.  Specifically the patient did not have nausea or vomiting or headache.  Specifically HA is less of a problem with Spravato. Other psych meds: Clonazepam 0.5 mg tablets 2 nightly, gabapentin dosage varies, Dayvigo 10 mg nightly, Lexapro 20 mg daily, to trazodone 100 mg nightly, Jornay 60 mg PM Wants to increase Jornay to 80 mg PM to increase benefit for ADD and depression. No SE She has experienced a major family crisis that threatens to change her daily life from now on.  It has not triggered suicidal thoughts at this time but she is very afraid.  No desire to change medications.  08/04/22 appt noted" Patient was administered Spravato 84 mg intranasally today.  The patient experienced the typical dissociation which gradually resolved over the 2-hour period of observation.  There were no complications.  Specifically the patient did not have nausea or vomiting or headache.  Specifically HA is less of a problem with Spravato. Other psych meds: Clonazepam 0.5 mg tablets 2 nightly, gabapentin dosage varies, Dayvigo 10 mg nightly, Lexapro 20 mg daily, trazodone 100 mg nightly, Jornay 80 mg PM No SE No benefit or SE with increase Jormay 80 mg.  Says Concerta helped ADD and depression but not Jornay.  However duration was insufficient.  Still depressed and wants change to another form of stimulant.  No concerns with other meds. Continues with strong family stressors creating uncertainty about her future but no quick resolution. No SI Trial Cotempla 17.3 and DC Ritalin & Jornay 80  08/11/2022 appointment noted: Patient was administered Spravato 84 mg intranasally today.  The patient experienced the typical dissociation which gradually resolved over the 2-hour period of observation.  There were no complications.  Specifically the patient did not have nausea or vomiting or headache.  Specifically HA is less of a problem with Spravato now  vs when started it.. Other psych meds: Clonazepam 0.5 mg tablets 2 nightly, gabapentin dosage varies, Dayvigo 10 mg nightly, trazodone 100 mg nightly, cotempla 17.3, switch from Lexapro 20 to sertaline 15o mg . No noticeable effects sertraline from for depression but has seen some antianxiety effects from the sertraline.  No side effects noted.  No side effects with the other medicines.  Still is only getting very brief benefit 3 to 4 hours from Cotempla for ADD and mood.  She wants to try the Daytrana patch as we have discussed before. No SE  08/13/22 appt: Patient was administered Spravato 84 mg intranasally today.  The patient experienced the typical dissociation which gradually resolved over the 2-hour period of observation.  There were no complications.  Specifically the patient did not have nausea or vomiting or headache.  Specifically HA is less of a problem with Spravato now vs when started it.. Other psych meds: Clonazepam 0.5 mg tablets 2 nightly, gabapentin dosage varies, Dayvigo 10 mg nightly, trazodone 100 mg nightly, cotempla 17.3, switch from Lexapro 20 to sertaline 15o mg . No noticeable effects sertraline from for depression but has seen some antianxiety effects from the sertraline.  No side effects noted.  No side effects with the other medicines.  Still is only getting very brief benefit 3 to 4 hours from Cotempla for ADD and mood.  She wants to try the Daytrana patch as we have discussed before.  Hasn't been able to get it yet. No SE Plan.  Continue to try to get Daytrana 30 AM  08/18/22 appt noted: Patient was administered Spravato 84 mg intranasally today.  The patient experienced the typical dissociation which gradually resolved over the 2-hour period of observation.  There were no complications.  Specifically the patient did not have nausea or vomiting or headache.  Specifically HA is less of a problem with Spravato now vs when started it.. Other psych meds: Clonazepam 0.5 mg  tablets 2 nightly, gabapentin dosage varies, Dayvigo 10 mg nightly, trazodone 100 mg nightly, cotempla 17.3, switch from Lexapro 20 to sertaline 15o mg . No noticeable effects sertraline from for depression but has seen some antianxiety effects from the sertraline.  No side effects noted.  No side effects with the other medicines.  Still is only getting very brief benefit 3 to 4 hours from Cotempla for ADD and mood.  She wants to try the Daytrana patch as we have discussed before.  Hasn't been able to get it yet.  Overall depression is some better than it was.   No SE Plan.  Continue to try to get Daytrana 30 AM  08/25/22 appt : No benefit Daytrana.  No SE.  Wants further med changes and interested In retrying pramipexole.   Tolerating meds. Patient was administered Spravato 84 mg intranasally today.  The patient experienced the typical dissociation which gradually resolved over the 2-hour period of observation.  There were no complications.  Specifically the patient did not have nausea or vomiting or headache.  Specifically HA is less of a problem with Spravato now vs when started it.. Other psych meds: Clonazepam 0.5 mg tablets 2 nightly, gabapentin dosage varies, Dayvigo 10 mg nightly, trazodone 100 mg nightly, , switch from Lexapro 20 to sertaline 15o mg . Daytrana 30 AM for a few days. Struggles with depression and no SI.  Reduced interest and mood and motivation and enjoyment and socialization.  08/27/22 appt noted: Psych meds:  Clonazepam 0.5 mg tablets 2 nightly, gabapentin dosage varies, Dayvigo 10 mg nightly, trazodone 100 mg nightly,  sertaline 15o mg . Returned to [employer] AM, pramipexole  Tolerating meds. Patient was administered Spravato 84 mg intranasally today.  The patient experienced the typical dissociation which gradually resolved over the 2-hour period of observation.  There were no complications.  Specifically the patient did not have nausea or vomiting or headache.   Reports  taking cotempla bc brief mood benefit and sig focus and productivity benefit. Took 1 dose pramipexole and felt more dep.  09/01/22 appt noted: Psych meds:  Clonazepam 0.5 mg tablets 2 nightly, gabapentin dosage varies, Dayvigo 10 mg nightly, trazodone 100 mg nightly,  sertaline 15o mg . Returned to Cotempla AM, pramipexole  0.25 mg BID. Tolerating meds now. Patient was administered  Spravato 84 mg intranasally today.  The patient experienced the typical dissociation which gradually resolved over the 2-hour period of observation.  There were no complications.  Specifically the patient did not have nausea or vomiting or headache.   Willing to try the pramipexole and will continue 0.25 mg BID this week and then consider increase if tolerated.  Mood is a little better.  Still looking for further improvement.    AIMS    Flowsheet Row Video Visit from 01/03/2022 in [facility]  AIMS Total Score 0      ECT-MADRS    Flowsheet Row Clinical Support from 08/13/2022 in [facility] Video Visit from 04/29/2022 in [facility] Video Visit from 02/06/2022 in [facility]  MADRS Total Score 27 44 23      GAD-7    Flowsheet Row Office Visit from 08/22/2022 in [facility] [place]  Total GAD-7 Score 7      PHQ2-9    Flowsheet Row Office Visit from 08/22/2022 in [facility] [place] Office Visit from 08/19/2022 in [facility] for [facility] at [facility] Video Visit from 02/06/2022 in [facility] Office Visit from 11/16/2020 in [facility] for [facility] [place] Office Visit from 04/19/2018 in [facility] [place]  PHQ-2 Total Score 3 0 6 0 0  PHQ-9 Total Score 5 -- 14 -- 2        Past Psychiatric Medication Trials: Trintellix - Initially  effective and then was not effective when re-started Paxil- Ineffective. Helped initially. Prozac-Insomnia Lexapro- Effective and then no longer as effective Zoloft- Initially effective and then not as effective.  Viibryd Cymbalta- Ineffective. Helped initially. Effexor XR- Effective, well tolerated. Pristiq- Increased anxiety at 150 mg daily Wellbutrin XL-Ineffective.May have increased anxiety. Amitriptyline Nortriptyline-Caused irritability, constipation Auvelity- ineffective  Abilify- Helpful but caused severe insomnia. Rexulti-Helpful but caused insomnia. Vraylar Seroquel - Ineffective Risperdal Olanzapine- Ineffective Latuda- Ineffective Caplyta  Klonopin- Effective Temazepam- Took during menopause Lunesta- Ineffective Ambien-Ineffective Sonata- Ineffective Dayvigo- partially effective Trazodone- Ineffective Remeron-Ineffective. Does not recall wt gain.   Adderall- Took once and had adverse effect. Concerta- Effective but only 4-6 hours Jornay 80 NR Metadate-Not as effective as Concerta Dexmethylphenidate- Not as effective as Concerta Azstarys- some side effects. Not as effective.  Ritalin short acting and SE anxiety & HTN @20  TID Cotemplay 17.3  Daytrana  Lamictal- May have had an adverse effects. "Felt weird." Reports worsening s/s.  Lithium Gabapentin Pramipexole- "felt really weird."   ECT #20 with minimal response. H says it was partly helpful.  AIMS    Flowsheet Row Video Visit from 01/03/2022 in [facility]  AIMS Total Score 0      ECT-MADRS    Flowsheet Row Clinical Support from 08/13/2022 in [facility] Video Visit from 04/29/2022 in [facility] Video Visit from 02/06/2022 in [facility]  MADRS Total Score 27 44 23      GAD-7    Flowsheet Row Office Visit from 08/22/2022 in [facility] [place]  Total  GAD-7 Score 7      PHQ2-9    Flowsheet Row Office Visit from 08/22/2022 in [facility] [place] Office Visit from 08/19/2022 in [facility] at [facility] Video Visit from 02/06/2022 in [place]  [facility] Office Visit from 11/16/2020 in [facility] Office Visit from 04/19/2018 in [facility] Total Score 3 0 6 0 0  PHQ-9 Total Score 5 -- 14 -- 2        Review of Systems:  Review of Systems  Constitutional:  Positive for fatigue.  Cardiovascular:  Negative for chest pain and palpitations.  Neurological:  Negative for tremors.  Psychiatric/Behavioral:  Positive for dysphoric mood. Negative for decreased concentration. The patient is nervous/anxious.     Medications: I have reviewed the patient's current medications.  Current Outpatient Medications  Medication Sig Dispense Refill   clonazePAM (KLONOPIN) 0.5 MG tablet 2 tablets at night and 1 tablet as needed daily for panic anxiety 75 tablet 1   Esketamine HCl, 84 MG Dose, (SPRAVATO, 84 MG DOSE,) 28 MG/DEVICE SOPK USE 3 SPRAYS IN EACH NOSTRIL EVERY 3 DAYS 3 each 1   Estradiol (VAGIFEM) 10 MCG TABS vaginal tablet Place 1 tablet (10 mcg total) vaginally 2 (two) times a week. 24 tablet 3   gabapentin (NEURONTIN) 800 MG tablet Take 1 tablet (800 mg total) by mouth at bedtime. 90 tablet 1   MAGNESIUM PO Take by mouth.     [START ON 09/04/2022] Methylphenidate (COTEMPLA XR-ODT) 17.3 MG TBED Take 1 tablet (17.3 mg total) by mouth every morning. 30 tablet 0   Multiple Vitamin (MULTIVITAMIN) tablet Take 1 tablet by mouth daily.     pramipexole (MIRAPEX) 0.25 MG tablet 1 twice daily for 5 days then 1and 1/2 tablets twice daily (Patient taking differently: Take 0.25 mg by mouth 2 (two) times daily.) 90 tablet 0   sertraline (ZOLOFT) 100 MG tablet Take 2 tablets (200 mg total) by  mouth daily. 180 tablet 0   traZODone (DESYREL) 100 MG tablet Take 1 tablet (100 mg total) by mouth at bedtime. Take 1-2 tabs po QHS prn 180 tablet 1   valACYclovir (VALTREX) 500 MG tablet Take one twice daily for 3 days with any symptoms 30 tablet 3   No current facility-administered medications for this visit.    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   TRANSAMINASES, SERUM, ELEVATED 08/14/2009    Past Medical History, Surgical history, Social history, and Family history were reviewed and updated as appropriate.   Please see review of systems for further details on the patient's review from today.   Objective:   Physical Exam:  LMP 12/27/2007 (Exact Date)   Physical Exam Constitutional:      General: She is not in acute distress. Musculoskeletal:        General: No deformity.  Neurological:     Mental Status: She is alert and oriented to person, place, and time.     Sensory: Sensory deficit present.     Coordination: Coordination normal.  Psychiatric:        Attention and Perception: Attention and perception normal. She does not perceive auditory hallucinations.  Mood and Affect: Mood is anxious and depressed. Affect is blunt. Affect is not labile.        Speech: Speech normal. Speech is not slurred.        Behavior: Behavior normal.        Thought Content: Thought content normal. Thought content is not delusional. Thought content does not include homicidal or suicidal ideation. Thought content does not include suicidal plan.        Cognition and Memory: Cognition and memory normal.         Judgment: Judgment normal.     Comments: Insight intact Less down than last week. Affect blunted still     Lab Review:     Component Value Date/Time   NA 138 04/22/2022 0932   K 4.1 04/22/2022 0932   CL 98 04/22/2022 0932   CO2 31 04/22/2022 0932   GLUCOSE 102 (H) 04/22/2022 0932   BUN 10 04/22/2022 0932   CREATININE 0.71 04/22/2022 0932   CREATININE 0.68 01/26/2020 0920   CALCIUM 10.5 04/22/2022 0932   PROT 8.3 04/22/2022 0932   ALBUMIN 5.3 (H) 04/22/2022 0932   AST 82 (H) 04/22/2022 0932   ALT 118 (H) 04/22/2022 0932   ALKPHOS 74 04/22/2022 0932   BILITOT 0.3 04/22/2022 0932   GFRNONAA >90 05/01/2014 1645   GFRAA >90 05/01/2014 1645       Component Value Date/Time   WBC 3.3 (L) 04/22/2022 0932   RBC 4.25 04/22/2022 0932   HGB 14.3 04/22/2022 0932   HGB 14.1 10/26/2014 1034   HCT 40.7 04/22/2022 0932   PLT 245.0 04/22/2022 0932   MCV 95.7 04/22/2022 0932   MCH 34.4 (H) 01/26/2020 0920   MCHC 35.1 04/22/2022 0932   RDW 13.2 04/22/2022 0932   LYMPHSABS 0.9 04/22/2022 0932   MONOABS 0.2 04/22/2022 0932   EOSABS 0.1 04/22/2022 0932   BASOSABS 0.0 04/22/2022 0932    No results found for: "POCLITH", "LITHIUM"   No results found for: "PHENYTOIN", "PHENOBARB", "VALPROATE", "CBMZ"   .res Assessment: Plan:    Therasa was seen today for follow-up, depression, anxiety, stress and add.  Diagnoses and all orders for this visit:  Recurrent major depression resistant to treatment [facility])  Generalized anxiety disorder  Attention deficit hyperactivity disorder (ADHD), predominantly inattentive type  Insomnia, unspecified type  Stressful life event affecting family     Pt seen for TRD and administration of Spravato.  Patient was administered Spravato 84 mg intranasally today.  The patient experienced the typical dissociation which gradually resolved over the 2-hour period of observation.  There were no complications.  Specifically the patient did not have nausea or  vomiting. But mild headache.  Blood pressures remained within normal ranges at the 40-minute and 2-hour follow-up intervals.  By the time the 2-hour observation period was met the patient was alert and oriented and able to exit without assistance.  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  See nursing note for further details.Tolerated the Spravato to 84mg  . Plan to continue twice daily to max response.  Regarding med options.  She has obviously been thoroughly tried on numerous medications.  The only antidepressant category that she has not taken is MAO inhibitors which are not compatible with Spravato.  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 Disc this with her  previously.    We discussed the short-term risks associated with benzodiazepines including sedation and increased fall risk among others.  Discussed long-term side effect risk including dependence, potential withdrawal symptoms, and the potential eventual dose-related risk of dementia.  But recent studies from 2020 dispute this association between benzodiazepines and dementia risk. Newer studies in 2020 do not support an association with dementia.  Less intense depression with Spravato but some relapse ? Re: stopping Concerta bc of the crash.    Continue sertaline to 200 mg daily.  It has helped anxiety bu tnot depression.    for insomnia, clonazepam 1 mg HS. trazodone doesn't work well.  She wants to continue Cotempla or Concerta bc helps mood early in day and focus  benefit lasts longer.  Have not been able to get stimulant to be consitently effective throughout the day.  continue pramipexole trial off-label and increase more slowly for TRD.  Increased to 0.25 mg BID  Crisis intervention around the events  that have occurred in her family that may change her life from this point forward.  She is managing with expected emotional distress but not suicidal thought.  She does not desire any further medication changes except noted.  FU twice weekly Spravato until improvement maxes out then drop back to weekly.  She wants to continue twice weekly as long as possible.  Meredith Staggers, MD, DFAPA    Please see After Visit Summary for patient specific instructions.  Future Appointments  Date Time Provider Department Center  09/03/2022  8:00 AM CP-NURSE [facility] None  09/03/2022  9:00 AM Cottle, Steva Ready., MD [facility] None  09/08/2022  1:00 PM Cottle, Steva Ready., MD [facility] None  09/08/2022  1:00 PM CP-NURSE CP-CP None  09/11/2022  8:00 AM [facility] 1 [facility]  09/11/2022  9:00 AM Cottle, Steva Ready., MD CP-CP None  09/11/2022  9:00 AM CP-NURSE CP-CP None  09/15/2022  1:00 PM Cottle, Steva Ready., MD CP-CP None  09/15/2022  1:00 PM CP-NURSE CP-CP None  09/17/2022  8:00 AM CP-NURSE CP-CP None  09/17/2022  9:00 AM Cottle, Steva Ready., MD CP-CP None  09/23/2022  8:00 AM CP-NURSE CP-CP None  09/23/2022  9:00 AM Cottle, Steva Ready., MD CP-CP None  09/25/2022  8:00 AM CP-NURSE CP-CP None  09/25/2022  9:00 AM Cottle, Steva Ready., MD CP-CP None  09/18/2023  8:15 AM Jerene Bears, MD [facility]    No orders of the defined types were placed in this encounter.   ------------------------------- T

## 2022-09-01 NOTE — Telephone Encounter (Signed)
Worsks best of all stimulants she's tried

## 2022-09-01 NOTE — Progress Notes (Signed)
NURSES NOTE:     Patient arrived for her 30th  Spravato treatment. Pt is being treated for Treatment Resistant Depression, the starting dose is 56 mg (2 of the 28 mg) nasal sprays. Pt will be receiving 84 mg (3 of the 28 mg) nasal sprays, this is her maintenance dose. Patient taken to treatment room, I explained and discussed her treatment and how the schedule should go, along with any side effects that may occur. Answered any questions and concerns the patient had. Pt's Spravato is delivered through [place] and she is now billing with her insurance. Spravato medication is stored at doctors office per REMS/FDA guidelines. The medication is required to be locked behind two doors per FDA/REMS Protocol. Medication is also disposed of properly per regulations. All treatments and her vital signs are documented in Spravato REMS per protocol of treatment center regulations.      Pt asked about a refill on Cotempla and to cancel any other Rx's still out on her Day[employer] Patches which I did. Vital Signs assessed at 12:50 PM 130/88, pulse 74, pulse ox 92%. Instructed pt to blow her nose and recline back slightly to prevent any of medication dripping out of her nose.  Pt. Given 1st dose (28 mg inhaler), then 5 minutes between 2nd dose and 3rd dose. No complaints of nausea/vomiting reported. Pt did listen to music today, she just reclined back and closed her eyes. After 40 minutes I went to assess her vital signs, no side effects or symptoms reported, B/P 126/81, pulse 78. Informed pt could rest in the chair Dr. Jennelle Human came in to discuss her treatment at the end. She will continue twice a week Spravato treatments. Nurse was with pt a total of 60 minutes but pt observed for 120 minutes per protocol. Discharge vital signs were at 2:52 PM, B/P 143/77, pulse 58. She reports dissociation but she was clear upon her discharge. She scheduled her next apt  May 8th.. Advised to contact me with any concerns or issues prior to her apt  next week.    LOT # [id number] EXP OCT 2026

## 2022-09-03 ENCOUNTER — Ambulatory Visit: Payer: 59

## 2022-09-03 ENCOUNTER — Encounter: Payer: Self-pay | Admitting: Psychiatry

## 2022-09-03 ENCOUNTER — Ambulatory Visit (INDEPENDENT_AMBULATORY_CARE_PROVIDER_SITE_OTHER): Payer: 59 | Admitting: Psychiatry

## 2022-09-03 ENCOUNTER — Other Ambulatory Visit: Payer: Self-pay | Admitting: Psychiatry

## 2022-09-03 VITALS — BP 129/69 | HR 69

## 2022-09-03 DIAGNOSIS — F339 Major depressive disorder, recurrent, unspecified: Secondary | ICD-10-CM

## 2022-09-03 DIAGNOSIS — F411 Generalized anxiety disorder: Secondary | ICD-10-CM

## 2022-09-03 DIAGNOSIS — F900 Attention-deficit hyperactivity disorder, predominantly inattentive type: Secondary | ICD-10-CM

## 2022-09-03 DIAGNOSIS — G4700 Insomnia, unspecified: Secondary | ICD-10-CM | POA: Diagnosis not present

## 2022-09-03 DIAGNOSIS — Z6379 Other stressful life events affecting family and household: Secondary | ICD-10-CM

## 2022-09-03 NOTE — Progress Notes (Signed)
NURSES NOTE:     Patient arrived for her 31st  Spravato treatment. Pt is being treated for Treatment Resistant Depression, the starting dose is 56 mg (2 of the 28 mg) nasal sprays. Pt will be receiving 84 mg (3 of the 28 mg) nasal sprays, this is her maintenance dose. Patient taken to treatment room, I explained and discussed her treatment and how the schedule should go, along with any side effects that may occur. Answered any questions and concerns the patient had. Pt's Spravato is delivered through [place] and she is now billing with her insurance. Spravato medication is stored at doctors office per REMS/FDA guidelines. The medication is required to be locked behind two doors per FDA/REMS Protocol. Medication is also disposed of properly per regulations. All treatments and her vital signs are documented in Spravato REMS per protocol of treatment center regulations.      Vital Signs assessed at 7:55 AM 117/74, pulse 66, pulse ox 92%. Instructed pt to blow her nose and recline back slightly to prevent any of medication dripping out of her nose.  Pt. Given 1st dose (28 mg inhaler), then 5 minutes between 2nd dose and 3rd dose. No complaints of nausea/vomiting reported. Pt did listen to music today, she just reclined back and closed her eyes. After 40 minutes I went to assess her vital signs, no side effects or symptoms reported, B/P 121/74, pulse 63. Informed pt could rest in the chair Dr. Jennelle Human came in to discuss her treatment at the end. She will continue twice a week Spravato treatments. Nurse was with pt a total of 60 minutes but pt observed for 120 minutes per protocol. Discharge vital signs were at 10:00 AM, B/P 129/69, pulse 63. She reports dissociation but she was clear upon her discharge. She scheduled her next apt  May13 th.. Advised to contact me with any concerns or issues prior to her apt next week.    LOT # [id number] EXP OCT 2026

## 2022-09-03 NOTE — Progress Notes (Signed)
Heather Davila [id number] 18-May-1960 62 y.o.  Subjective:   Patient ID:  Heather Davila is a 62 y.o. (DOB 1961/01/11) female.  Chief Complaint:  Chief Complaint  Patient presents with   Follow-up   Depression   Anxiety   Stress   ADD    HPI Heather Davila presents to the office today for follow-up of treatment resistant recurrent depression, generalized anxiety disorder and insomnia.  Almost too numerous to count med failures. She is here for Spravato administration for her treatment resistant depression.  05/21/22 appt noted: Patient was administered first dose Spravato 56 mg intranasally today.  The patient experienced the typical dissociation which gradually resolved over the 2-hour period of observation.  There were no complications.  Specifically the patient did not have nausea or vomiting or headache.   Dissociation was mild. Did not experience any emotional relief from disabling depression.wants to increase Spravato to the usual dose.  She is aware insurance is not covering the costs at this time.  No SI acutely. Tolerating meds.    05/23/22 appt noted: Patient was administered Spravato 84 mg intranasally today.  The patient experienced the typical dissociation which gradually resolved over the 2-hour period of observation.  There were no complications.  Specifically the patient did not have nausea or vomiting.   Dissociation was greater than last dose and a little bothersome DT some HA. Tolerating meds.   Mood has not changed significantly thus far with Spravato.  05/27/22 appt noted: Patient was administered Spravato 84 mg intranasally today.  The patient experienced the typical dissociation which gradually resolved over the 2-hour period of observation.  There were no complications.  Specifically the patient did not have nausea or vomiting or headache. Today she has felt a little relief from the pressing heaviness of depression but a little more anxiety.  During administration  of Spravato she listens to [place] music and was able to relax a little more with the feeling of dissociation that was mildly bothersome. Husband was also in on the session today and asked some questions about IV ketamine versus nasal spray ketamine.  05/29/22 appt noted: Cancelled today DT hypertension  05/30/22 appt noted: Patient was administered Spravato 84 mg intranasally today.  The patient experienced the typical dissociation which gradually resolved over the 2-hour period of observation.  There were no complications.  Specifically the patient did not have nausea or vomiting or headache. She is seeing some improvement in mood with less continuous depression and less intensity.  Hopefulness is better.   No med complaints or SE  06/05/22 appt noted: Patient was administered Spravato 84 mg intranasally today.  The patient experienced the typical dissociation which gradually resolved over the 2-hour period of observation.  There were no complications.  Specifically the patient did not have nausea or vomiting or headache.  Specifically HA is less of a problem with Spravato. No SE with meds. Depression is improving with less intensity.  Intermittent anxiety easily.  More able to enjoy things.  No new concerns.  06/17/22 appt noted: Patient was administered Spravato 84 mg intranasally today.  The patient experienced the typical dissociation which gradually resolved over the 2-hour period of observation.  There were no complications.  Specifically the patient did not have nausea or vomiting or headache.  Specifically HA is less of a problem with Spravato. No SE with meds. Depression is improved 45% with less intensity.  Intermittent anxiety less easily.  More able to enjoy things.  No new concerns.  More active.  Current meds: increased clonazepam to about 1.5 mg HS DT recent insomnia, lexapro 20, Dayvigo 10 mg HS, concerta 36 mg AM, trazodone 100 mg HS.  06/20/22 appt noted: Current meds: increased  clonazepam to about 1.5 mg HS DT recent insomnia, lexapro 20, Dayvigo 10 mg HS, concerta 36 mg AM, trazodone 100 mg HS. Patient was administered Spravato 84 mg intranasally today.  The patient experienced the typical dissociation which gradually resolved over the 2-hour period of observation.  There were no complications.  Specifically the patient did not have nausea or vomiting or headache.  Specifically HA is less of a problem with Spravato. No SE with meds. Depression is improved 45% with less intensity.  Intermittent anxiety less easily.  More able to enjoy things.  No new concerns.  More active.  06/23/22 appt noted:  Current meds: increased clonazepam to about 1.5 mg HS DT recent insomnia, lexapro 20, Dayvigo 10 mg HS, concerta 36 mg AM, trazodone 100 mg HS. Patient was administered Spravato 84 mg intranasally today.  The patient experienced the typical dissociation which gradually resolved over the 2-hour period of observation.  There were no complications.  Specifically the patient did not have nausea or vomiting or headache.  Specifically HA is less of a problem with Spravato. No SE with meds. Intensity of Spravato dissociation varies.  Last week very intesne and this week more typical.  Depression is continuing to improve with better interest and activity and motivation.  Gradual improvement.  Wants to continue.  06/25/22 appt noted: Current meds: increased clonazepam to about 1.5 mg HS DT recent insomnia, lexapro 20, Dayvigo 10 mg HS, concerta 36 mg AM, trazodone 100 mg HS. Patient was administered Spravato 84 mg intranasally today.  The patient experienced the typical dissociation which gradually resolved over the 2-hour period of observation.  There were no complications.  Specifically the patient did not have nausea or vomiting or headache.  Specifically HA is less of a problem with Spravato. No SE with meds. Intensity of Spravato dissociation varies.  No scary and well  tolerated. Continues to gradually improve with Spravato.  She is more motivated and enjoying things more.  H sees progress.  Wants to continue twice weekly until gets maximum response.  Dep is not gone yet.  06/30/22 appt noted:  seen with H Current meds: increased clonazepam to about 1.5 mg HS DT recent insomnia, lexapro 20, Dayvigo 10 mg HS, concerta 36 mg AM, trazodone 100 mg HS. Patient was administered Spravato 84 mg intranasally today.  The patient experienced the typical dissociation which gradually resolved over the 2-hour period of observation.  There were no complications.  Specifically the patient did not have nausea or vomiting or headache.  Specifically HA is less of a problem with Spravato. No SE with meds. Intensity of Spravato dissociation varies.  No scary and well tolerated. Continues to gradually improve with Spravato.  She is more motivated and enjoying things more.  H sees even more progress than she does; more active and smiling.  Wants to continue twice weekly until gets maximum response.  Dep is 80% better.  07/07/22 appt noted: Current meds: increased clonazepam to about 1.5 mg HS DT recent insomnia, lexapro 20, Dayvigo 10 mg HS, concerta 36 mg AM, trazodone 100 mg HS. Patient was administered Spravato 84 mg intranasally today.  The patient experienced the typical dissociation which gradually resolved over the 2-hour period of observation.  There were no complications.  Specifically the patient did not have nausea  or vomiting or headache.  Specifically HA is less of a problem with Spravato. No SE with meds. Intensity of Spravato dissociation varies.  No scary and well tolerated.  Was more intese today. Overall mood is markedly better and please with meds. No changes desired.  07/09/22 appt noted: In transition from Lexapro to sertraline and missing Concerta bc CO crash from it after about 4 hours.  Disc these concerns.  She'll discuss also with Corie Chiquito, NP More dep the  last couple of days and concerned about reducing Spravato frequency while transition from one antidep to another.  Affect less positive. Patient was administered Spravato 84 mg intranasally today.  The patient experienced the typical dissociation which gradually resolved over the 2-hour period of observation.  There were no complications.  Specifically the patient did not have nausea or vomiting or headache.  Specifically HA is less of a problem with Spravato. No SE with meds. Intensity of Spravato dissociation varies.  No scary and well tolerated.   07/14/22 appt noted: Has been more depressed this week.  More trouble with sleep.  Taking clonazepam 1-1.5  of the 0.5 mg tablets.   Trazodone not helping much. She switched back from 200 sertraline to Lexapro 20.  No SE More trouble with motivation.  Some stress with son with special needs. Misses the benevit of Concerta but it was too short acting and crashed in 4-6 hours. Patient was administered Spravato 84 mg intranasally today.  The patient experienced the typical dissociation which gradually resolved over the 2-hour period of observation.  There were no complications.  Specifically the patient did not have nausea or vomiting or headache.  Specifically HA is less of a problem with Spravato. No SE with meds. Intensity of Spravato dissociation varies.  No scary and well tolerated.   07/17/22 appt noted: Patient was administered Spravato 84 mg intranasally today.  The patient experienced the typical dissociation which gradually resolved over the 2-hour period of observation.  There were no complications.  Specifically the patient did not have nausea or vomiting or headache.  Specifically HA is less of a problem with Spravato. No SE with meds. Intensity of Spravato dissociation varies.  Not scary and well tolerated.  Just got Jornay last night but wasn't sure how to take it.  Wants to start and this was discussed.  She felt MPH helped mood and attn but  didn't last long enough and had crash after a few hours on Concerta.  Better for 2 hours after each dose Ritalin 20 mg with mood but then it wore off.  BP up after 3rd dose 160/90 and then took H's amlodipine 5 and BP became normal.  07/22/22 appt noted: Patient was administered Spravato 84 mg intranasally today.  The patient experienced the typical dissociation which gradually resolved over the 2-hour period of observation.  There were no complications.  Specifically the patient did not have nausea or vomiting or headache.  Specifically HA is less of a problem with Spravato. No SE with meds. Intensity of Spravato dissociation varies.  Not scary and well tolerated.  Start Jornay 20 mg over the weekend and did not notice any particular effect good or bad.  Increased to 40 mg. Other psych meds: Clonazepam 0.5 mg tablets 2 nightly, gabapentin dosage varies, Dayvigo 10 mg nightly, Lexapro 20 mg daily, to trazodone 100 mg nightly  07/24/22 appt noted: Patient was administered Spravato 84 mg intranasally today.  The patient experienced the typical dissociation which gradually resolved over  the 2-hour period of observation.  There were no complications.  Specifically the patient did not have nausea or vomiting or headache.  Specifically HA is less of a problem with Spravato. No SE with meds. Intensity of Spravato dissociation varies.  Not scary and well tolerated.  Other psych meds: Clonazepam 0.5 mg tablets 2 nightly, gabapentin dosage varies, Dayvigo 10 mg nightly, Lexapro 20 mg daily, to trazodone 100 mg nightly, Jornay 40 mg PM Doing well with Spravato.  Noticed a little effect from the Jornay 40 without SE but would like to increase the dose for ADD and mood.  Sleeping well.  No new concerns.  Still more depressed than she was with th initial benefit of Spravato.  07/30/22 note:  Patient was administered Spravato 84 mg intranasally today.  The patient experienced the typical dissociation which gradually  resolved over the 2-hour period of observation.  There were no complications.  Specifically the patient did not have nausea or vomiting or headache.  Specifically HA is less of a problem with Spravato. Other psych meds: Clonazepam 0.5 mg tablets 2 nightly, gabapentin dosage varies, Dayvigo 10 mg nightly, Lexapro 20 mg daily, to trazodone 100 mg nightly, Jornay 60 mg PM Wants to increase Jornay to 80 mg PM to increase benefit for ADD and depression. No SE She has experienced a major family crisis that threatens to change her daily life from now on.  It has not triggered suicidal thoughts at this time but she is very afraid.  No desire to change medications.  08/04/22 appt noted" Patient was administered Spravato 84 mg intranasally today.  The patient experienced the typical dissociation which gradually resolved over the 2-hour period of observation.  There were no complications.  Specifically the patient did not have nausea or vomiting or headache.  Specifically HA is less of a problem with Spravato. Other psych meds: Clonazepam 0.5 mg tablets 2 nightly, gabapentin dosage varies, Dayvigo 10 mg nightly, Lexapro 20 mg daily, trazodone 100 mg nightly, Jornay 80 mg PM No SE No benefit or SE with increase Jormay 80 mg.  Says Concerta helped ADD and depression but not Jornay.  However duration was insufficient.  Still depressed and wants change to another form of stimulant.  No concerns with other meds. Continues with strong family stressors creating uncertainty about her future but no quick resolution. No SI Trial Cotempla 17.3 and DC Ritalin & Jornay 80  08/11/2022 appointment noted: Patient was administered Spravato 84 mg intranasally today.  The patient experienced the typical dissociation which gradually resolved over the 2-hour period of observation.  There were no complications.  Specifically the patient did not have nausea or vomiting or headache.  Specifically HA is less of a problem with Spravato now  vs when started it.. Other psych meds: Clonazepam 0.5 mg tablets 2 nightly, gabapentin dosage varies, Dayvigo 10 mg nightly, trazodone 100 mg nightly, cotempla 17.3, switch from Lexapro 20 to sertaline 15o mg . No noticeable effects sertraline from for depression but has seen some antianxiety effects from the sertraline.  No side effects noted.  No side effects with the other medicines.  Still is only getting very brief benefit 3 to 4 hours from Cotempla for ADD and mood.  She wants to try the Daytrana patch as we have discussed before. No SE  08/13/22 appt: Patient was administered Spravato 84 mg intranasally today.  The patient experienced the typical dissociation which gradually resolved over the 2-hour period of observation.  There were no complications.  Specifically the patient did not have nausea or vomiting or headache.  Specifically HA is less of a problem with Spravato now vs when started it.. Other psych meds: Clonazepam 0.5 mg tablets 2 nightly, gabapentin dosage varies, Dayvigo 10 mg nightly, trazodone 100 mg nightly, cotempla 17.3, switch from Lexapro 20 to sertaline 15o mg . No noticeable effects sertraline from for depression but has seen some antianxiety effects from the sertraline.  No side effects noted.  No side effects with the other medicines.  Still is only getting very brief benefit 3 to 4 hours from Cotempla for ADD and mood.  She wants to try the Daytrana patch as we have discussed before.  Hasn't been able to get it yet. No SE Plan.  Continue to try to get Daytrana 30 AM  08/18/22 appt noted: Patient was administered Spravato 84 mg intranasally today.  The patient experienced the typical dissociation which gradually resolved over the 2-hour period of observation.  There were no complications.  Specifically the patient did not have nausea or vomiting or headache.  Specifically HA is less of a problem with Spravato now vs when started it.. Other psych meds: Clonazepam 0.5 mg  tablets 2 nightly, gabapentin dosage varies, Dayvigo 10 mg nightly, trazodone 100 mg nightly, cotempla 17.3, switch from Lexapro 20 to sertaline 15o mg . No noticeable effects sertraline from for depression but has seen some antianxiety effects from the sertraline.  No side effects noted.  No side effects with the other medicines.  Still is only getting very brief benefit 3 to 4 hours from Cotempla for ADD and mood.  She wants to try the Daytrana patch as we have discussed before.  Hasn't been able to get it yet.  Overall depression is some better than it was.   No SE Plan.  Continue to try to get Daytrana 30 AM  08/25/22 appt : No benefit Daytrana.  No SE.  Wants further med changes and interested In retrying pramipexole.   Tolerating meds. Patient was administered Spravato 84 mg intranasally today.  The patient experienced the typical dissociation which gradually resolved over the 2-hour period of observation.  There were no complications.  Specifically the patient did not have nausea or vomiting or headache.  Specifically HA is less of a problem with Spravato now vs when started it.. Other psych meds: Clonazepam 0.5 mg tablets 2 nightly, gabapentin dosage varies, Dayvigo 10 mg nightly, trazodone 100 mg nightly, , switch from Lexapro 20 to sertaline 15o mg . Daytrana 30 AM for a few days. Struggles with depression and no SI.  Reduced interest and mood and motivation and enjoyment and socialization.  08/27/22 appt noted: Psych meds:  Clonazepam 0.5 mg tablets 2 nightly, gabapentin dosage varies, Dayvigo 10 mg nightly, trazodone 100 mg nightly,  sertaline 15o mg . Returned to [employer] AM, pramipexole  Tolerating meds. Patient was administered Spravato 84 mg intranasally today.  The patient experienced the typical dissociation which gradually resolved over the 2-hour period of observation.  There were no complications.  Specifically the patient did not have nausea or vomiting or headache.   Reports  taking cotempla bc brief mood benefit and sig focus and productivity benefit. Took 1 dose pramipexole and felt more dep.  09/01/22 appt noted: Psych meds:  Clonazepam 0.5 mg tablets 2 nightly, gabapentin dosage varies, Dayvigo 10 mg nightly, trazodone 100 mg nightly,  sertaline 15o mg . Returned to Cotempla AM, pramipexole  0.25 mg BID. Tolerating meds now. Patient was administered  Spravato 84 mg intranasally today.  The patient experienced the typical dissociation which gradually resolved over the 2-hour period of observation.  There were no complications.  Specifically the patient did not have nausea or vomiting or headache.   Willing to try the pramipexole and will continue 0.25 mg BID this week and then consider increase if tolerated.  Mood is a little better.  Still looking for further improvement.  09/03/22 appt noted: Psych meds:  Clonazepam 0.5 mg tablets 2 nightly, gabapentin dosage varies, Dayvigo 10 mg nightly, trazodone 100 mg nightly,  sertaline 15o mg . Returned to Cotempla AM, pramipexole  0.25 mg tablet 1 and 1/2 tablets twice daily BID. Tolerating meds now. Patient was administered Spravato 84 mg intranasally today.  The patient experienced the typical dissociation which gradually resolved over the 2-hour period of observation.  There were no complications.  Specifically the patient did not have nausea or vomiting or headache.   Depression may be a little better with the parmipexole and is tolerating it well so far.  Still struggling with focus and residual depression.  Tolerating meds without SE.  More hopeful and able to enjoy some things.  AIMS    Flowsheet Row Video Visit from 01/03/2022 in [facility]  AIMS Total Score 0      ECT-MADRS    Flowsheet Row Clinical Support from 08/13/2022 in [facility] Video Visit from 04/29/2022 in [facility] Video Visit from 02/06/2022 in [facility]  MADRS Total Score 27 44 23      GAD-7    Flowsheet Row Office Visit from 08/22/2022 in [facility] [place]  Total GAD-7 Score 7      PHQ2-9    Flowsheet Row Office Visit from 08/22/2022 in [facility] [place] Office Visit from 08/19/2022 in [facility] Video Visit from 02/06/2022 in [facility] Office Visit from 11/16/2020 in [facility] at [place] Office Visit from 04/19/2018 in [facility] at [place]  PHQ-2 Total Score 3 0 6 0 0  PHQ-9 Total Score 5 -- 14 -- 2        Past Psychiatric Medication Trials: Trintellix - Initially effective and then was not effective when re-started Paxil- Ineffective. Helped initially. Prozac-Insomnia Lexapro- Effective and then no longer as effective Zoloft- Initially effective and then not as effective.  Viibryd Cymbalta- Ineffective. Helped initially. Effexor XR- Effective, well tolerated. Pristiq- Increased anxiety at 150 mg daily Wellbutrin XL-Ineffective.May have increased anxiety. Amitriptyline Nortriptyline-Caused irritability, constipation Auvelity- ineffective  Abilify- Helpful but caused severe insomnia. Rexulti-Helpful but caused insomnia. Vraylar Seroquel - Ineffective Risperdal Olanzapine- Ineffective Latuda- Ineffective Caplyta  Klonopin- Effective Temazepam- Took during menopause Lunesta- Ineffective Ambien-Ineffective Sonata- Ineffective Dayvigo- partially effective Trazodone- Ineffective Remeron-Ineffective. Does not recall wt gain.   Adderall- Took once and had adverse effect. Concerta- Effective but only 4-6 hours Jornay 80 NR Metadate-Not as effective as Concerta Dexmethylphenidate- Not as effective as Concerta Azstarys- some side effects. Not as effective.  Ritalin short acting and SE anxiety & HTN  @21  TID Cotemplay 17.3  Daytrana  Lamictal- May have had an adverse effects. "Felt weird." Reports worsening s/s.  Lithium Gabapentin Pramipexole- "felt really weird."   ECT #20 with minimal response. H says it was partly helpful.  AIMS    Flowsheet Row Video Visit from 01/03/2022 in  [place] [facility]  AIMS Total Score 0      ECT-MADRS    Flowsheet Row Clinical Support from 08/13/2022 in [facility] Video Visit from 04/29/2022 in [facility] Video Visit from 02/06/2022 in [facility]  MADRS Total Score 27 44 23      GAD-7    Flowsheet Row Office Visit from 08/22/2022 in [facility] [place] [facility] [place]  Total GAD-7 Score 7      PHQ2-9    Flowsheet Row Office Visit from 08/22/2022 in [facility] [place] Office Visit from 08/19/2022 in [facility] at [facility] Video Visit from 02/06/2022 in [facility] Office Visit from 11/16/2020 in [facility] for [facility] [place] Office Visit from 04/19/2018 in [facility] Total Score 3 0 6 0 0  PHQ-9 Total Score 5 -- 14 -- 2        Review of Systems:  Review of Systems  Constitutional:  Positive for fatigue.  Cardiovascular:  Negative for palpitations.  Neurological:  Negative for tremors.  Psychiatric/Behavioral:  Positive for dysphoric mood. Negative for decreased concentration. The patient is nervous/anxious.     Medications: I have reviewed the patient's current medications.  Current Outpatient Medications  Medication Sig Dispense Refill   clonazePAM (KLONOPIN) 0.5 MG tablet 2 tablets at night and 1 tablet as needed daily for panic anxiety 75 tablet 1   Esketamine HCl, 84 MG Dose, (SPRAVATO, 84 MG DOSE,) 28 MG/DEVICE SOPK USE 3 SPRAYS IN EACH NOSTRIL  EVERY 3 DAYS 3 each 1   Estradiol (VAGIFEM) 10 MCG TABS vaginal tablet Place 1 tablet (10 mcg total) vaginally 2 (two) times a week. 24 tablet 3   gabapentin (NEURONTIN) 800 MG tablet Take 1 tablet (800 mg total) by mouth at bedtime. 90 tablet 1   MAGNESIUM PO Take by mouth.     [START ON 09/04/2022] Methylphenidate (COTEMPLA XR-ODT) 17.3 MG TBED Take 1 tablet (17.3 mg total) by mouth every morning. 30 tablet 0   Multiple Vitamin (MULTIVITAMIN) tablet Take 1 tablet by mouth daily.     pramipexole (MIRAPEX) 0.25 MG tablet 1 twice daily for 5 days then 1and 1/2 tablets twice daily (Patient taking differently: Take 0.375 mg by mouth 2 (two) times daily.) 90 tablet 0   sertraline (ZOLOFT) 100 MG tablet Take 2 tablets (200 mg total) by mouth daily. 180 tablet 0   traZODone (DESYREL) 100 MG tablet Take 1 tablet (100 mg total) by mouth at bedtime. Take 1-2 tabs po QHS prn 180 tablet 1   valACYclovir (VALTREX) 500 MG tablet Take one twice daily for 3 days with any symptoms 30 tablet 3   No current facility-administered medications for this visit.    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   TRANSAMINASES, SERUM, ELEVATED 08/14/2009    Past Medical History, Surgical history, Social history, and Family  history were reviewed and updated as appropriate.   Please see review of systems for further details on the patient's review from today.   Objective:   Physical Exam:  LMP 12/27/2007 (Exact Date)   Physical Exam Constitutional:      General: She is not in acute distress. Musculoskeletal:        General: No  deformity.  Neurological:     Mental Status: She is alert and oriented to person, place, and time.     Coordination: Coordination normal.  Psychiatric:        Attention and Perception: Attention and perception normal. She does not perceive auditory hallucinations.        Mood and Affect: Mood is anxious and depressed. Affect is blunt. Affect is not labile.        Speech: Speech normal. Speech is not slurred.        Behavior: Behavior normal.        Thought Content: Thought content normal. Thought content is not delusional. Thought content does not include homicidal or suicidal ideation. Thought content does not include suicidal plan.        Cognition and Memory: Cognition and memory normal.        Judgment: Judgment normal.     Comments: Insight intact Less down than last week. Affect blunted still     Lab Review:     Component Value Date/Time   NA 138 04/22/2022 0932   K 4.1 04/22/2022 0932   CL 98 04/22/2022 0932   CO2 31 04/22/2022 0932   GLUCOSE 102 (H) 04/22/2022 0932   BUN 10 04/22/2022 0932   CREATININE 0.71 04/22/2022 0932   CREATININE 0.68 01/26/2020 0920   CALCIUM 10.5 04/22/2022 0932   PROT 8.3 04/22/2022 0932   ALBUMIN 5.3 (H) 04/22/2022 0932   AST 82 (H) 04/22/2022 0932   ALT 118 (H) 04/22/2022 0932   ALKPHOS 74 04/22/2022 0932   BILITOT 0.3 04/22/2022 0932   GFRNONAA >90 05/01/2014 1645   GFRAA >90 05/01/2014 1645       Component Value Date/Time   WBC 3.3 (L) 04/22/2022 0932   RBC 4.25 04/22/2022 0932   HGB 14.3 04/22/2022 0932   HGB 14.1 10/26/2014 1034   HCT 40.7 04/22/2022 0932   PLT 245.0 04/22/2022 0932   MCV 95.7 04/22/2022 0932   MCH 34.4 (H) 01/26/2020 0920   MCHC 35.1 04/22/2022 0932   RDW 13.2 04/22/2022 0932   LYMPHSABS 0.9 04/22/2022 0932   MONOABS 0.2 04/22/2022 0932   EOSABS 0.1 04/22/2022 0932   BASOSABS 0.0 04/22/2022 0932    No results found for: "POCLITH", "LITHIUM"   No results found for: "PHENYTOIN", "PHENOBARB",  "VALPROATE", "CBMZ"   .res Assessment: Plan:    Ladavia was seen today for follow-up, depression, anxiety, stress and add.  Diagnoses and all orders for this visit:  Recurrent major depression resistant to treatment [facility])  Generalized anxiety disorder  Attention deficit hyperactivity disorder (ADHD), predominantly inattentive type  Insomnia, unspecified type  Stressful life event affecting family     Pt seen for TRD and administration of Spravato.  Patient was administered Spravato 84 mg intranasally today.  The patient experienced the typical dissociation which gradually resolved over the 2-hour period of observation.  There were no complications.  Specifically the patient did not have nausea or vomiting. But mild headache.  Blood pressures remained within normal ranges at the 40-minute and 2-hour follow-up intervals.  By the time the 2-hour observation period was met  the patient was alert and oriented and able to exit without assistance.  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  See nursing note for further details.Tolerated the Spravato to 84mg  . Plan to continue twice daily to max response.  Regarding med options.  She has obviously been thoroughly tried on numerous medications.  The only antidepressant category that she has not taken is MAO inhibitors which are not compatible with Spravato.  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 Disc this with her previously.    We discussed the short-term risks associated with benzodiazepines including sedation and increased fall risk among others.  Discussed long-term side effect risk including dependence, potential withdrawal symptoms, and the potential eventual dose-related risk of dementia.  But recent studies  from 2020 dispute this association between benzodiazepines and dementia risk. Newer studies in 2020 do not support an association with dementia.  Less intense depression with Spravato but some relapse ? Re: stopping Concerta bc of the crash.    Continue sertaline to 200 mg daily.  It has helped anxiety bu tnot depression.    for insomnia, clonazepam 1 mg HS. trazodone doesn't work well.  She wants to continue Cotempla or Concerta bc helps mood early in day and focus  benefit lasts longer.  Have not been able to get stimulant to be consitently effective throughout the day.  continue pramipexole trial off-label and increase more slowly for TRD.  Increase  Gradually  to 0.375 mg BID, then likely to 0.5 mg BID She is tolerating this without problems so far.  Crisis intervention around the events that have occurred in her family that may change her life from this point forward.  She is managing with expected emotional distress but not suicidal thought.  She does not desire any further medication changes except noted.  FU twice weekly Spravato until improvement maxes out then drop back to weekly.  She wants to continue twice weekly as long as possible.  Meredith Staggers, MD, DFAPA    Please see After Visit Summary for patient specific instructions.  Future Appointments  Date Time Provider Department Center  09/08/2022  1:00 PM Cottle, Steva Ready., MD [facility] None  09/08/2022  1:00 PM CP-NURSE [facility] None  09/11/2022  8:00 AM [facility] 1 [facility]  09/11/2022  9:00 AM Cottle, Steva Ready., MD [facility] None  09/11/2022  9:00 AM CP-NURSE [facility] None  09/15/2022  1:00 PM Cottle, Steva Ready., MD [facility] None  09/15/2022  1:00 PM CP-NURSE [facility] None  09/17/2022  8:00 AM CP-NURSE [facility] None  09/17/2022  9:00 AM Cottle, Steva Ready., MD [facility] None  09/23/2022  8:00 AM CP-NURSE [facility] None  09/23/2022  9:00 AM Cottle, Steva Ready., MD [facility] None  09/25/2022  8:00 AM CP-NURSE [facility] None  09/25/2022  9:00 AM Cottle, Steva Ready., MD [facility] None  09/18/2023  8:15 AM Jerene Bears, MD [facility]    No orders of the defined types were placed in this encounter.   ------------------------------- T

## 2022-09-05 ENCOUNTER — Other Ambulatory Visit: Payer: Self-pay | Admitting: Psychiatry

## 2022-09-05 DIAGNOSIS — F339 Major depressive disorder, recurrent, unspecified: Secondary | ICD-10-CM

## 2022-09-05 NOTE — Telephone Encounter (Signed)
Hold she doesn't need any sent at this time

## 2022-09-08 ENCOUNTER — Ambulatory Visit (INDEPENDENT_AMBULATORY_CARE_PROVIDER_SITE_OTHER): Payer: 59 | Admitting: Psychiatry

## 2022-09-08 ENCOUNTER — Ambulatory Visit: Payer: 59

## 2022-09-08 ENCOUNTER — Encounter: Payer: Self-pay | Admitting: Psychiatry

## 2022-09-08 VITALS — BP 144/84 | HR 72

## 2022-09-08 DIAGNOSIS — F339 Major depressive disorder, recurrent, unspecified: Secondary | ICD-10-CM

## 2022-09-08 DIAGNOSIS — G4700 Insomnia, unspecified: Secondary | ICD-10-CM

## 2022-09-08 DIAGNOSIS — F900 Attention-deficit hyperactivity disorder, predominantly inattentive type: Secondary | ICD-10-CM | POA: Diagnosis not present

## 2022-09-08 DIAGNOSIS — F411 Generalized anxiety disorder: Secondary | ICD-10-CM

## 2022-09-08 DIAGNOSIS — Z6379 Other stressful life events affecting family and household: Secondary | ICD-10-CM

## 2022-09-08 NOTE — Progress Notes (Signed)
NURSES NOTE:     Patient arrived for her 32nd  Spravato treatment. Pt is being treated for Treatment Resistant Depression, the starting dose is 56 mg (2 of the 28 mg) nasal sprays. Pt will be receiving 84 mg (3 of the 28 mg) nasal sprays, this is her maintenance dose. Patient taken to treatment room, I explained and discussed her treatment and how the schedule should go, along with any side effects that may occur. Answered any questions and concerns the patient had. Pt's Spravato is delivered through [place] and she is now billing with her insurance. Spravato medication is stored at doctors office per REMS/FDA guidelines. The medication is required to be locked behind two doors per FDA/REMS Protocol. Medication is also disposed of properly per regulations. All treatments and her vital signs are documented in Spravato REMS per protocol of treatment center regulations.      Vital Signs assessed at 12:50 PM 139/72, pulse 86, pulse ox 92%. Instructed pt to blow her nose and recline back slightly to prevent any of medication dripping out of her nose. Pt reports having a sore throat today so she is wearing a mask in between dosing and when medical staff walks in room.  Pt. Given 1st dose (28 mg inhaler), then 5 minutes between 2nd dose and 3rd dose. No complaints of nausea/vomiting reported. Pt did listen to music today, she just reclined back and closed her eyes. After 40 minutes I went to assess her vital signs, no side effects or symptoms reported, B/P 144/84, pulse 72. Informed pt could rest in the chair Dr. Jennelle Human came in to discuss her treatment at the end. She will continue twice a week Spravato treatments. Nurse was with pt a total of 60 minutes but pt observed for 120 minutes per protocol. Discharge vital signs were at 2:50 PM, B/P 134/88, pulse 76. She reports dissociation but she was clear upon her discharge. She scheduled her next apt  May16 th, Thursday.. Advised to contact me with any concerns or issues  prior to her apt next week.    LOT # [id number] EXP OCT 2026

## 2022-09-08 NOTE — Progress Notes (Signed)
Heather Davila [id number] 18-May-1960 62 y.o.  Subjective:   Patient ID:  Heather Davila is a 62 y.o. (DOB 1961/01/11) female.  Chief Complaint:  Chief Complaint  Patient presents with   Follow-up   Depression   Anxiety   Stress   ADD    HPI Heather Davila presents to the office today for follow-up of treatment resistant recurrent depression, generalized anxiety disorder and insomnia.  Almost too numerous to count med failures. She is here for Spravato administration for her treatment resistant depression.  05/21/22 appt noted: Patient was administered first dose Spravato 56 mg intranasally today.  The patient experienced the typical dissociation which gradually resolved over the 2-hour period of observation.  There were no complications.  Specifically the patient did not have nausea or vomiting or headache.   Dissociation was mild. Did not experience any emotional relief from disabling depression.wants to increase Spravato to the usual dose.  She is aware insurance is not covering the costs at this time.  No SI acutely. Tolerating meds.    05/23/22 appt noted: Patient was administered Spravato 84 mg intranasally today.  The patient experienced the typical dissociation which gradually resolved over the 2-hour period of observation.  There were no complications.  Specifically the patient did not have nausea or vomiting.   Dissociation was greater than last dose and a little bothersome DT some HA. Tolerating meds.   Mood has not changed significantly thus far with Spravato.  05/27/22 appt noted: Patient was administered Spravato 84 mg intranasally today.  The patient experienced the typical dissociation which gradually resolved over the 2-hour period of observation.  There were no complications.  Specifically the patient did not have nausea or vomiting or headache. Today she has felt a little relief from the pressing heaviness of depression but a little more anxiety.  During administration  of Spravato she listens to [place] music and was able to relax a little more with the feeling of dissociation that was mildly bothersome. Husband was also in on the session today and asked some questions about IV ketamine versus nasal spray ketamine.  05/29/22 appt noted: Cancelled today DT hypertension  05/30/22 appt noted: Patient was administered Spravato 84 mg intranasally today.  The patient experienced the typical dissociation which gradually resolved over the 2-hour period of observation.  There were no complications.  Specifically the patient did not have nausea or vomiting or headache. She is seeing some improvement in mood with less continuous depression and less intensity.  Hopefulness is better.   No med complaints or SE  06/05/22 appt noted: Patient was administered Spravato 84 mg intranasally today.  The patient experienced the typical dissociation which gradually resolved over the 2-hour period of observation.  There were no complications.  Specifically the patient did not have nausea or vomiting or headache.  Specifically HA is less of a problem with Spravato. No SE with meds. Depression is improving with less intensity.  Intermittent anxiety easily.  More able to enjoy things.  No new concerns.  06/17/22 appt noted: Patient was administered Spravato 84 mg intranasally today.  The patient experienced the typical dissociation which gradually resolved over the 2-hour period of observation.  There were no complications.  Specifically the patient did not have nausea or vomiting or headache.  Specifically HA is less of a problem with Spravato. No SE with meds. Depression is improved 45% with less intensity.  Intermittent anxiety less easily.  More able to enjoy things.  No new concerns.  More active.  Current meds: increased clonazepam to about 1.5 mg HS DT recent insomnia, lexapro 20, Dayvigo 10 mg HS, concerta 36 mg AM, trazodone 100 mg HS.  06/20/22 appt noted: Current meds: increased  clonazepam to about 1.5 mg HS DT recent insomnia, lexapro 20, Dayvigo 10 mg HS, concerta 36 mg AM, trazodone 100 mg HS. Patient was administered Spravato 84 mg intranasally today.  The patient experienced the typical dissociation which gradually resolved over the 2-hour period of observation.  There were no complications.  Specifically the patient did not have nausea or vomiting or headache.  Specifically HA is less of a problem with Spravato. No SE with meds. Depression is improved 45% with less intensity.  Intermittent anxiety less easily.  More able to enjoy things.  No new concerns.  More active.  06/23/22 appt noted:  Current meds: increased clonazepam to about 1.5 mg HS DT recent insomnia, lexapro 20, Dayvigo 10 mg HS, concerta 36 mg AM, trazodone 100 mg HS. Patient was administered Spravato 84 mg intranasally today.  The patient experienced the typical dissociation which gradually resolved over the 2-hour period of observation.  There were no complications.  Specifically the patient did not have nausea or vomiting or headache.  Specifically HA is less of a problem with Spravato. No SE with meds. Intensity of Spravato dissociation varies.  Last week very intesne and this week more typical.  Depression is continuing to improve with better interest and activity and motivation.  Gradual improvement.  Wants to continue.  06/25/22 appt noted: Current meds: increased clonazepam to about 1.5 mg HS DT recent insomnia, lexapro 20, Dayvigo 10 mg HS, concerta 36 mg AM, trazodone 100 mg HS. Patient was administered Spravato 84 mg intranasally today.  The patient experienced the typical dissociation which gradually resolved over the 2-hour period of observation.  There were no complications.  Specifically the patient did not have nausea or vomiting or headache.  Specifically HA is less of a problem with Spravato. No SE with meds. Intensity of Spravato dissociation varies.  No scary and well  tolerated. Continues to gradually improve with Spravato.  She is more motivated and enjoying things more.  H sees progress.  Wants to continue twice weekly until gets maximum response.  Dep is not gone yet.  06/30/22 appt noted:  seen with H Current meds: increased clonazepam to about 1.5 mg HS DT recent insomnia, lexapro 20, Dayvigo 10 mg HS, concerta 36 mg AM, trazodone 100 mg HS. Patient was administered Spravato 84 mg intranasally today.  The patient experienced the typical dissociation which gradually resolved over the 2-hour period of observation.  There were no complications.  Specifically the patient did not have nausea or vomiting or headache.  Specifically HA is less of a problem with Spravato. No SE with meds. Intensity of Spravato dissociation varies.  No scary and well tolerated. Continues to gradually improve with Spravato.  She is more motivated and enjoying things more.  H sees even more progress than she does; more active and smiling.  Wants to continue twice weekly until gets maximum response.  Dep is 80% better.  07/07/22 appt noted: Current meds: increased clonazepam to about 1.5 mg HS DT recent insomnia, lexapro 20, Dayvigo 10 mg HS, concerta 36 mg AM, trazodone 100 mg HS. Patient was administered Spravato 84 mg intranasally today.  The patient experienced the typical dissociation which gradually resolved over the 2-hour period of observation.  There were no complications.  Specifically the patient did not have nausea  or vomiting or headache.  Specifically HA is less of a problem with Spravato. No SE with meds. Intensity of Spravato dissociation varies.  No scary and well tolerated.  Was more intese today. Overall mood is markedly better and please with meds. No changes desired.  07/09/22 appt noted: In transition from Lexapro to sertraline and missing Concerta bc CO crash from it after about 4 hours.  Disc these concerns.  She'll discuss also with Corie Chiquito, NP More dep the  last couple of days and concerned about reducing Spravato frequency while transition from one antidep to another.  Affect less positive. Patient was administered Spravato 84 mg intranasally today.  The patient experienced the typical dissociation which gradually resolved over the 2-hour period of observation.  There were no complications.  Specifically the patient did not have nausea or vomiting or headache.  Specifically HA is less of a problem with Spravato. No SE with meds. Intensity of Spravato dissociation varies.  No scary and well tolerated.   07/14/22 appt noted: Has been more depressed this week.  More trouble with sleep.  Taking clonazepam 1-1.5  of the 0.5 mg tablets.   Trazodone not helping much. She switched back from 200 sertraline to Lexapro 20.  No SE More trouble with motivation.  Some stress with son with special needs. Misses the benevit of Concerta but it was too short acting and crashed in 4-6 hours. Patient was administered Spravato 84 mg intranasally today.  The patient experienced the typical dissociation which gradually resolved over the 2-hour period of observation.  There were no complications.  Specifically the patient did not have nausea or vomiting or headache.  Specifically HA is less of a problem with Spravato. No SE with meds. Intensity of Spravato dissociation varies.  No scary and well tolerated.   07/17/22 appt noted: Patient was administered Spravato 84 mg intranasally today.  The patient experienced the typical dissociation which gradually resolved over the 2-hour period of observation.  There were no complications.  Specifically the patient did not have nausea or vomiting or headache.  Specifically HA is less of a problem with Spravato. No SE with meds. Intensity of Spravato dissociation varies.  Not scary and well tolerated.  Just got Jornay last night but wasn't sure how to take it.  Wants to start and this was discussed.  She felt MPH helped mood and attn but  didn't last long enough and had crash after a few hours on Concerta.  Better for 2 hours after each dose Ritalin 20 mg with mood but then it wore off.  BP up after 3rd dose 160/90 and then took H's amlodipine 5 and BP became normal.  07/22/22 appt noted: Patient was administered Spravato 84 mg intranasally today.  The patient experienced the typical dissociation which gradually resolved over the 2-hour period of observation.  There were no complications.  Specifically the patient did not have nausea or vomiting or headache.  Specifically HA is less of a problem with Spravato. No SE with meds. Intensity of Spravato dissociation varies.  Not scary and well tolerated.  Start Jornay 20 mg over the weekend and did not notice any particular effect good or bad.  Increased to 40 mg. Other psych meds: Clonazepam 0.5 mg tablets 2 nightly, gabapentin dosage varies, Dayvigo 10 mg nightly, Lexapro 20 mg daily, to trazodone 100 mg nightly  07/24/22 appt noted: Patient was administered Spravato 84 mg intranasally today.  The patient experienced the typical dissociation which gradually resolved over  the 2-hour period of observation.  There were no complications.  Specifically the patient did not have nausea or vomiting or headache.  Specifically HA is less of a problem with Spravato. No SE with meds. Intensity of Spravato dissociation varies.  Not scary and well tolerated.  Other psych meds: Clonazepam 0.5 mg tablets 2 nightly, gabapentin dosage varies, Dayvigo 10 mg nightly, Lexapro 20 mg daily, to trazodone 100 mg nightly, Jornay 40 mg PM Doing well with Spravato.  Noticed a little effect from the Jornay 40 without SE but would like to increase the dose for ADD and mood.  Sleeping well.  No new concerns.  Still more depressed than she was with th initial benefit of Spravato.  07/30/22 note:  Patient was administered Spravato 84 mg intranasally today.  The patient experienced the typical dissociation which gradually  resolved over the 2-hour period of observation.  There were no complications.  Specifically the patient did not have nausea or vomiting or headache.  Specifically HA is less of a problem with Spravato. Other psych meds: Clonazepam 0.5 mg tablets 2 nightly, gabapentin dosage varies, Dayvigo 10 mg nightly, Lexapro 20 mg daily, to trazodone 100 mg nightly, Jornay 60 mg PM Wants to increase Jornay to 80 mg PM to increase benefit for ADD and depression. No SE She has experienced a major family crisis that threatens to change her daily life from now on.  It has not triggered suicidal thoughts at this time but she is very afraid.  No desire to change medications.  08/04/22 appt noted" Patient was administered Spravato 84 mg intranasally today.  The patient experienced the typical dissociation which gradually resolved over the 2-hour period of observation.  There were no complications.  Specifically the patient did not have nausea or vomiting or headache.  Specifically HA is less of a problem with Spravato. Other psych meds: Clonazepam 0.5 mg tablets 2 nightly, gabapentin dosage varies, Dayvigo 10 mg nightly, Lexapro 20 mg daily, trazodone 100 mg nightly, Jornay 80 mg PM No SE No benefit or SE with increase Jormay 80 mg.  Says Concerta helped ADD and depression but not Jornay.  However duration was insufficient.  Still depressed and wants change to another form of stimulant.  No concerns with other meds. Continues with strong family stressors creating uncertainty about her future but no quick resolution. No SI Trial Cotempla 17.3 and DC Ritalin & Jornay 80  08/11/2022 appointment noted: Patient was administered Spravato 84 mg intranasally today.  The patient experienced the typical dissociation which gradually resolved over the 2-hour period of observation.  There were no complications.  Specifically the patient did not have nausea or vomiting or headache.  Specifically HA is less of a problem with Spravato now  vs when started it.. Other psych meds: Clonazepam 0.5 mg tablets 2 nightly, gabapentin dosage varies, Dayvigo 10 mg nightly, trazodone 100 mg nightly, cotempla 17.3, switch from Lexapro 20 to sertaline 15o mg . No noticeable effects sertraline from for depression but has seen some antianxiety effects from the sertraline.  No side effects noted.  No side effects with the other medicines.  Still is only getting very brief benefit 3 to 4 hours from Cotempla for ADD and mood.  She wants to try the Daytrana patch as we have discussed before. No SE  08/13/22 appt: Patient was administered Spravato 84 mg intranasally today.  The patient experienced the typical dissociation which gradually resolved over the 2-hour period of observation.  There were no complications.  Specifically the patient did not have nausea or vomiting or headache.  Specifically HA is less of a problem with Spravato now vs when started it.. Other psych meds: Clonazepam 0.5 mg tablets 2 nightly, gabapentin dosage varies, Dayvigo 10 mg nightly, trazodone 100 mg nightly, cotempla 17.3, switch from Lexapro 20 to sertaline 15o mg . No noticeable effects sertraline from for depression but has seen some antianxiety effects from the sertraline.  No side effects noted.  No side effects with the other medicines.  Still is only getting very brief benefit 3 to 4 hours from Cotempla for ADD and mood.  She wants to try the Daytrana patch as we have discussed before.  Hasn't been able to get it yet. No SE Plan.  Continue to try to get Daytrana 30 AM  08/18/22 appt noted: Patient was administered Spravato 84 mg intranasally today.  The patient experienced the typical dissociation which gradually resolved over the 2-hour period of observation.  There were no complications.  Specifically the patient did not have nausea or vomiting or headache.  Specifically HA is less of a problem with Spravato now vs when started it.. Other psych meds: Clonazepam 0.5 mg  tablets 2 nightly, gabapentin dosage varies, Dayvigo 10 mg nightly, trazodone 100 mg nightly, cotempla 17.3, switch from Lexapro 20 to sertaline 15o mg . No noticeable effects sertraline from for depression but has seen some antianxiety effects from the sertraline.  No side effects noted.  No side effects with the other medicines.  Still is only getting very brief benefit 3 to 4 hours from Cotempla for ADD and mood.  She wants to try the Daytrana patch as we have discussed before.  Hasn't been able to get it yet.  Overall depression is some better than it was.   No SE Plan.  Continue to try to get Daytrana 30 AM  08/25/22 appt : No benefit Daytrana.  No SE.  Wants further med changes and interested In retrying pramipexole.   Tolerating meds. Patient was administered Spravato 84 mg intranasally today.  The patient experienced the typical dissociation which gradually resolved over the 2-hour period of observation.  There were no complications.  Specifically the patient did not have nausea or vomiting or headache.  Specifically HA is less of a problem with Spravato now vs when started it.. Other psych meds: Clonazepam 0.5 mg tablets 2 nightly, gabapentin dosage varies, Dayvigo 10 mg nightly, trazodone 100 mg nightly, , switch from Lexapro 20 to sertaline 15o mg . Daytrana 30 AM for a few days. Struggles with depression and no SI.  Reduced interest and mood and motivation and enjoyment and socialization.  08/27/22 appt noted: Psych meds:  Clonazepam 0.5 mg tablets 2 nightly, gabapentin dosage varies, Dayvigo 10 mg nightly, trazodone 100 mg nightly,  sertaline 15o mg . Returned to [employer] AM, pramipexole  Tolerating meds. Patient was administered Spravato 84 mg intranasally today.  The patient experienced the typical dissociation which gradually resolved over the 2-hour period of observation.  There were no complications.  Specifically the patient did not have nausea or vomiting or headache.   Reports  taking cotempla bc brief mood benefit and sig focus and productivity benefit. Took 1 dose pramipexole and felt more dep.  09/01/22 appt noted: Psych meds:  Clonazepam 0.5 mg tablets 2 nightly, gabapentin dosage varies, Dayvigo 10 mg nightly, trazodone 100 mg nightly,  sertaline 15o mg . Returned to Cotempla AM, pramipexole  0.25 mg BID. Tolerating meds now. Patient was administered  Spravato 84 mg intranasally today.  The patient experienced the typical dissociation which gradually resolved over the 2-hour period of observation.  There were no complications.  Specifically the patient did not have nausea or vomiting or headache.   Willing to try the pramipexole and will continue 0.25 mg BID this week and then consider increase if tolerated.  Mood is a little better.  Still looking for further improvement.  09/03/22 appt noted: Psych meds:  Clonazepam 0.5 mg tablets 2 nightly, gabapentin dosage varies, Dayvigo 10 mg nightly, trazodone 100 mg nightly,  sertaline 15o mg . Returned to Cotempla AM, pramipexole  0.25 mg tablet 1 and 1/2 tablets twice daily BID. Tolerating meds now. Patient was administered Spravato 84 mg intranasally today.  The patient experienced the typical dissociation which gradually resolved over the 2-hour period of observation.  There were no complications.  Specifically the patient did not have nausea or vomiting or headache.   Depression may be a little better with the parmipexole and is tolerating it well so far.  Still struggling with focus and residual depression.  Tolerating meds without SE.  More hopeful and able to enjoy some things.  09/08/22 appt  Psych meds:  Clonazepam 0.5 mg tablets 2 nightly, gabapentin dosage varies, Dayvigo 10 mg nightly, trazodone 100 mg nightly,  sertaline 15o mg . Returned to Cotempla AM, pramipexole  0.5 mg BID. Tolerating meds now. Patient was administered Spravato 84 mg intranasally today.  The patient experienced the typical dissociation which  gradually resolved over the 2-hour period of observation.  There were no complications.  Specifically the patient did not have nausea or vomiting or headache.   She has seen her depression drop from a 10/10 prior to treatment with Spravato to now 4/10 With additional benefit noted when she increased the pramipexole to 0.5 mg twice daily.  She is not having side effects with this like she did in the past. Anxiety levels are also improved. She is sleeping okay with the current medications.   AIMS    Flowsheet Row Video Visit from 01/03/2022 in [facility]  AIMS Total Score 0      ECT-MADRS    Flowsheet Row Clinical Support from 08/13/2022 in [facility] Video Visit from 04/29/2022 in [facility] Video Visit from 02/06/2022 in [facility]  MADRS Total Score 27 44 23      GAD-7    Flowsheet Row Office Visit from 08/22/2022 in [facility] [place]  Total GAD-7 Score 7      PHQ2-9    Flowsheet Row Office Visit from 08/22/2022 in [facility] [place] Office Visit from 08/19/2022 in [facility] Video Visit from 02/06/2022 in [facility] Office Visit from 11/16/2020 in [facility] [place] Office Visit from 04/19/2018 in [facility] [place]  PHQ-2 Total Score 3 0 6 0 0  PHQ-9 Total Score 5 -- 14 -- 2        Past Psychiatric Medication Trials: Trintellix - Initially effective and then was not effective when re-started Paxil- Ineffective. Helped initially. Prozac-Insomnia Lexapro- Effective and then no longer as effective Zoloft- Initially effective and then not as effective.  Viibryd Cymbalta- Ineffective. Helped initially. Effexor XR- Effective, well tolerated. Pristiq- Increased  anxiety at 150 mg daily Wellbutrin XL-Ineffective.May have increased anxiety. Amitriptyline Nortriptyline-Caused  irritability, constipation Auvelity- ineffective  Abilify- Helpful but caused severe insomnia. Rexulti-Helpful but caused insomnia. Vraylar Seroquel - Ineffective Risperdal Olanzapine- Ineffective Latuda- Ineffective Caplyta  Klonopin- Effective Temazepam- Took during menopause Lunesta- Ineffective Ambien-Ineffective Sonata- Ineffective Dayvigo- partially effective Trazodone- Ineffective Remeron-Ineffective. Does not recall wt gain.   Adderall- Took once and had adverse effect. Concerta- Effective but only 4-6 hours Jornay 80 NR Metadate-Not as effective as Concerta Dexmethylphenidate- Not as effective as Concerta Azstarys- some side effects. Not as effective.  Ritalin short acting and SE anxiety & HTN @20  TID Cotemplay 17.3  Daytrana  Lamictal- May have had an adverse effects. "Felt weird." Reports worsening s/s.  Lithium Gabapentin Pramipexole- "felt really weird."   ECT #20 with minimal response. H says it was partly helpful.  AIMS    Flowsheet Row Video Visit from 01/03/2022 in [facility]  AIMS Total Score 0      ECT-MADRS    Flowsheet Row Clinical Support from 08/13/2022 in [facility] Video Visit from 04/29/2022 in [facility] Video Visit from 02/06/2022 in [facility]  MADRS Total Score 27 44 23      GAD-7    Flowsheet Row Office Visit from 08/22/2022 in [facility] [place]  Total GAD-7 Score 7      PHQ2-9    Flowsheet Row Office Visit from 08/22/2022 in [facility] [place] Office Visit from 08/19/2022 in [facility] Video Visit from 02/06/2022 in [facility] Office Visit from 11/16/2020 in [facility] [place] Office Visit from 04/19/2018 in [facility] Total Score 3 0 6 0 0  PHQ-9 Total Score 5 -- 14 -- 2        Review of Systems:  Review of Systems  Constitutional:  Positive for fatigue.  Cardiovascular:  Negative for chest pain and palpitations.  Neurological:  Negative for tremors.  Psychiatric/Behavioral:  Positive for dysphoric mood. Negative for decreased concentration. The patient is nervous/anxious.     Medications: I have reviewed the patient's current medications.  Current Outpatient Medications  Medication Sig Dispense Refill   clonazePAM (KLONOPIN) 0.5 MG tablet 2 tablets at night and 1 tablet as needed daily for panic anxiety 75 tablet 1   Esketamine HCl, 84 MG Dose, (SPRAVATO, 84 MG DOSE,) 28 MG/DEVICE SOPK USE 3 SPRAYS IN EACH NOSTRIL EVERY 3 DAYS 3 each 1   Estradiol (VAGIFEM) 10 MCG TABS vaginal tablet Place 1 tablet (10 mcg total) vaginally 2 (two) times a week. 24 tablet 3   gabapentin (NEURONTIN) 800 MG tablet Take 1 tablet (800 mg total) by mouth at bedtime. 90 tablet 1   MAGNESIUM PO Take by mouth.     Methylphenidate (COTEMPLA XR-ODT) 17.3 MG TBED Take 1 tablet (17.3 mg total) by mouth every morning. 30 tablet 0   Multiple Vitamin (MULTIVITAMIN) tablet Take 1 tablet by mouth daily.     pramipexole (MIRAPEX) 0.25 MG tablet 1 twice daily for 5 days then 1and 1/2 tablets twice daily (Patient taking differently: Take 0.5 mg by mouth 2 (two) times daily.) 90 tablet 0   sertraline (ZOLOFT) 100 MG tablet Take 2 tablets (200 mg total) by mouth daily. 180 tablet 0   traZODone (DESYREL) 100 MG tablet Take 1 tablet (100 mg total) by mouth at bedtime. Take 1-2 tabs  po QHS prn 180 tablet 1   valACYclovir (VALTREX) 500 MG tablet Take one twice daily for 3 days with any symptoms 30 tablet 3   No current facility-administered medications for this visit.    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   TRANSAMINASES, SERUM, ELEVATED 08/14/2009    Past Medical History, Surgical history, Social history, and Family history were reviewed and updated as appropriate.   Please see review of systems for further details on the patient's review from today.   Objective:   Physical Exam:  LMP 12/27/2007 (Exact Date)   Physical Exam Constitutional:      General: She is not in acute distress. Musculoskeletal:        General: No deformity.  Neurological:     Mental Status: She is alert and oriented to person, place, and time.     Coordination: Coordination normal.  Psychiatric:        Attention and Perception: Attention and perception normal. She does not perceive auditory hallucinations.        Mood and Affect: Mood is anxious and depressed. Affect is blunt. Affect is not labile.        Speech: Speech normal. Speech is not slurred.        Behavior: Behavior normal.        Thought Content: Thought content normal. Thought content is not delusional. Thought content does not include homicidal or suicidal ideation. Thought content does not include suicidal plan.        Cognition and Memory: Cognition and memory normal.        Judgment: Judgment normal.     Comments: Insight intact Less down than last week. Affect blunted less     Lab Review:     Component Value Date/Time   NA 138 04/22/2022 0932   K 4.1 04/22/2022 0932   CL 98 04/22/2022 0932   CO2 31 04/22/2022 0932   GLUCOSE 102 (H) 04/22/2022 0932   BUN 10 04/22/2022 0932   CREATININE 0.71 04/22/2022 0932   CREATININE 0.68 01/26/2020  0920   CALCIUM 10.5 04/22/2022 0932   PROT 8.3 04/22/2022 0932   ALBUMIN 5.3 (H) 04/22/2022 0932   AST 82 (H) 04/22/2022 0932   ALT 118 (H) 04/22/2022 0932   ALKPHOS 74 04/22/2022 0932   BILITOT 0.3 04/22/2022 0932   GFRNONAA >90 05/01/2014 1645   GFRAA >90 05/01/2014 1645       Component Value Date/Time   WBC 3.3 (L) 04/22/2022 0932   RBC 4.25 04/22/2022 0932   HGB 14.3 04/22/2022 0932   HGB 14.1 10/26/2014 1034   HCT 40.7 04/22/2022 0932   PLT 245.0 04/22/2022 0932   MCV 95.7 04/22/2022 0932   MCH 34.4 (H) 01/26/2020 0920   MCHC 35.1 04/22/2022 0932   RDW 13.2 04/22/2022 0932   LYMPHSABS 0.9 04/22/2022 0932   MONOABS 0.2 04/22/2022 0932   EOSABS 0.1 04/22/2022 0932   BASOSABS 0.0 04/22/2022 0932    No results found for: "POCLITH", "LITHIUM"   No  results found for: "PHENYTOIN", "PHENOBARB", "VALPROATE", "CBMZ"   .res Assessment: Plan:    Deone was seen today for follow-up, depression, anxiety, stress and add.  Diagnoses and all orders for this visit:  Recurrent major depression resistant to treatment [facility])  Generalized anxiety disorder  Attention deficit hyperactivity disorder (ADHD), predominantly inattentive type  Insomnia, unspecified type  Stressful life event affecting family     Pt seen for TRD and administration of Spravato.  Is benefitting markedly.  Also retrial with paroxetine 0.5 mg twice daily has been markedly helpful at reducing the depression from a 10/10 level to 4/10 level in combination with the Spravato.  She has gotten additional benefit since the pramipexole was added.  We discussed the pros and cons of going up another step to see if we could push the depression into remission.  She would like to try increasing the pramipexole to 0.75 mg twice daily.  We discussed side effects of this medication including the unusual side effect potential of impulsivity or compulsivity or manic type symptoms.  Patient was administered Spravato 84 mg  intranasally today.  The patient experienced the typical dissociation which gradually resolved over the 2-hour period of observation.  There were no complications.  Specifically the patient did not have nausea or vomiting. But mild headache.  Blood pressures remained within normal ranges at the 40-minute and 2-hour follow-up intervals.  By the time the 2-hour observation period was met the patient was alert and oriented and able to exit without assistance.  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  See nursing note for further details.Tolerated the Spravato to 84mg  . Plan to continue twice daily to max response.  Regarding med options.  She has obviously been thoroughly tried on numerous medications.  The only antidepressant category that she has not taken is MAO inhibitors which are not compatible with Spravato.  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 Disc this with her previously.    We discussed the short-term risks associated with benzodiazepines including sedation and increased fall risk among others.  Discussed long-term side effect risk including dependence, potential withdrawal symptoms, and the potential eventual dose-related risk of dementia.  But recent studies from 2020 dispute this association between benzodiazepines and dementia risk. Newer studies in 2020 do not support an association with dementia.   Continue sertaline to 200 mg daily.  It has helped anxiety bu tnot depression.    for insomnia, clonazepam 1 mg HS. trazodone doesn't work well.  She wants to continue Cotempla or Concerta bc helps mood early in day and focus  benefit lasts longer.  Have not been able to get stimulant to be consitently effective throughout the day.  continue pramipexole trial  off-label and increase more slowly for TRD.  Increase  Gradually  to 0.75 mg BID She is tolerating this without problems so far.  Crisis intervention around the events that have occurred in her family that may change her life from this point forward.  She is managing with expected emotional distress but not suicidal thought.  She does not desire any further medication changes except noted.  FU twice weekly Spravato until improvement maxes out then drop back to weekly.  She wants to continue twice weekly as long as possible.  Meredith Staggers, MD, DFAPA    Please see  After Visit Summary for patient specific instructions.  Future Appointments  Date Time Provider Department Center  09/11/2022  8:00 AM [facility] 1 [facility]  09/11/2022  9:00 AM Cottle, Steva Ready., MD [facility] None  09/11/2022  9:00 AM CP-NURSE [facility] None  09/15/2022  1:00 PM Cottle, Steva Ready., MD [facility] None  09/15/2022  1:00 PM CP-NURSE [facility] None  09/17/2022  8:00 AM CP-NURSE [facility] None  09/17/2022  9:00 AM Cottle, Steva Ready., MD [facility] None  09/23/2022  8:00 AM CP-NURSE [facility] None  09/23/2022  9:00 AM Cottle, Steva Ready., MD [facility] None  09/25/2022  8:00 AM CP-NURSE CP-CP None  09/25/2022  9:00 AM Cottle, Steva Ready., MD [facility] None  09/18/2023  8:15 AM Jerene Bears, MD [facility]    No orders of the defined types were placed in this encounter.   ------------------------------- T

## 2022-09-11 ENCOUNTER — Encounter: Payer: Self-pay | Admitting: Psychiatry

## 2022-09-11 ENCOUNTER — Ambulatory Visit: Payer: 59

## 2022-09-11 ENCOUNTER — Ambulatory Visit (HOSPITAL_BASED_OUTPATIENT_CLINIC_OR_DEPARTMENT_OTHER)
Admission: RE | Admit: 2022-09-11 | Discharge: 2022-09-11 | Disposition: A | Discharge status: Home / Self Care | Payer: Managed Care, Other (non HMO) | Source: Ambulatory Visit | Attending: Obstetrics & Gynecology | Admitting: Obstetrics & Gynecology

## 2022-09-11 ENCOUNTER — Encounter (HOSPITAL_BASED_OUTPATIENT_CLINIC_OR_DEPARTMENT_OTHER): Payer: Self-pay | Admitting: Radiology

## 2022-09-11 ENCOUNTER — Ambulatory Visit (INDEPENDENT_AMBULATORY_CARE_PROVIDER_SITE_OTHER): Payer: 59 | Admitting: Psychiatry

## 2022-09-11 ENCOUNTER — Other Ambulatory Visit (HOSPITAL_BASED_OUTPATIENT_CLINIC_OR_DEPARTMENT_OTHER): Payer: Self-pay | Admitting: Obstetrics & Gynecology

## 2022-09-11 VITALS — BP 112/86 | HR 68

## 2022-09-11 DIAGNOSIS — F339 Major depressive disorder, recurrent, unspecified: Secondary | ICD-10-CM | POA: Diagnosis not present

## 2022-09-11 DIAGNOSIS — F900 Attention-deficit hyperactivity disorder, predominantly inattentive type: Secondary | ICD-10-CM

## 2022-09-11 DIAGNOSIS — G4700 Insomnia, unspecified: Secondary | ICD-10-CM | POA: Diagnosis not present

## 2022-09-11 DIAGNOSIS — F411 Generalized anxiety disorder: Secondary | ICD-10-CM

## 2022-09-11 DIAGNOSIS — Z1231 Encounter for screening mammogram for malignant neoplasm of breast: Secondary | ICD-10-CM | POA: Insufficient documentation

## 2022-09-11 DIAGNOSIS — Z6379 Other stressful life events affecting family and household: Secondary | ICD-10-CM

## 2022-09-11 NOTE — Progress Notes (Signed)
NURSES NOTE:     Patient arrived for her 77 th Spravato treatment. Pt is being treated for Treatment Resistant Depression, the starting dose is 56 mg (2 of the 28 mg) nasal sprays. Pt will be receiving 84 mg, this is her maintenance dose. Patient taken to treatment room, I explained and discussed her treatment and how the schedule should go, along with any side effects that may occur. Answered any questions and concerns the patient had. Pt's Spravato is delivered through [place] and she is now billing with her insurance. Spravato medication is stored at doctors office per REMS/FDA guidelines. The medication is required to be locked behind two doors per FDA/REMS Protocol. Medication is also disposed of properly per regulations.      Vital Signs assessed at 2:00 PM 115/70, pulse 69, pulse ox 92%. Instructed pt to blow her nose and recline back slightly to prevent any of medication dripping out of her nose. Pt. Given 1st dose (28 mg inhaler), then 5 minutes between 2nd dose and 3rd dose. No complaints of nausea/vomiting reported. Pt did listen to music today, she just reclined back and closed her eyes. After 40 minutes I went to assess her vital signs, no side effects or symptoms reported, B/P 125/71, pulse 59. Informed pt she could rest in the chair Dr. Jennelle Human came in to discuss her treatment at the end. She had no questions/concerns, Nurse was with pt a total of 60 minutes but pt observed for 120 minutes per protocol. Discharge vital signs were at 3:50 PM, B/P 109/62, pulse 62. Pt denies a headache. She reports dissociation she was clear upon her discharge, she left with her husband. She scheduled her next apt February 28 th. Pt is doing more and getting out of house more. Pt feeling much better.   LOT # [id number] EXP JAN 2027

## 2022-09-11 NOTE — Progress Notes (Signed)
NURSES NOTE:     Patient arrived for her #33  Spravato treatment. Pt is being treated for Treatment Resistant Depression, the starting dose is 56 mg (2 of the 28 mg) nasal sprays. Pt will be receiving 84 mg (3 of the 28 mg) nasal sprays, this is her maintenance dose. Patient taken to treatment room, I explained and discussed her treatment and how the schedule should go, along with any side effects that may occur. Answered any questions and concerns the patient had. Pt's Spravato is delivered through [place] and she is now billing with her insurance. Spravato medication is stored at doctors office per REMS/FDA guidelines. The medication is required to be locked behind two doors per FDA/REMS Protocol. Medication is also disposed of properly per regulations. All treatments and her vital signs are documented in Spravato REMS per protocol of treatment center regulations.      Vital Signs assessed at 8:50 PM 123/78, pulse 63, pulse ox 92%. Instructed pt to blow her nose and recline back slightly to prevent any of medication dripping out of her nose.  Pt. Given 1st dose (28 mg inhaler), then 5 minutes between 2nd dose and 3rd dose. No complaints of nausea/vomiting reported. Pt did listen to music today, she just reclined back and closed her eyes. After 40 minutes I went to assess her vital signs, no side effects or symptoms reported, B/P 139/77, pulse 64. Informed pt could rest in the chair Dr. Jennelle Human came in to discuss her treatment at the end. She will continue twice a week Spravato treatments. Nurse was with pt a total of 60 minutes but pt observed for 120 minutes per protocol. Discharge vital signs were at 10:45 PM, B/P 112/86, pulse 68. She reports dissociation but she was clear upon her discharge. She scheduled her next apt  May20 th, Monday.. Advised to contact me with any concerns or issues prior to her apt next week.    LOT # [id number] EXP OCT 2026

## 2022-09-11 NOTE — Progress Notes (Signed)
Heather Davila [id number] 06-15-1960 62 y.o.  Subjective:   Patient ID:  Heather Davila is a 62 y.o. (DOB [birth year]) female.  Chief Complaint:  Chief Complaint  Patient presents with   Follow-up   Depression   Anxiety   ADD   Fatigue    HPI Heather Davila presents to the office today for follow-up of treatment resistant recurrent depression, generalized anxiety disorder and insomnia.  Almost too numerous to count med failures. She is here for Spravato administration for her treatment resistant depression.  05/21/22 appt noted: Patient was administered first dose Spravato 56 mg intranasally today.  The patient experienced the typical dissociation which gradually resolved over the 2-hour period of observation.  There were no complications.  Specifically the patient did not have nausea or vomiting or headache.   Dissociation was mild. Did not experience any emotional relief from disabling depression.wants to increase Spravato to the usual dose.  She is aware insurance is not covering the costs at this time.  No SI acutely. Tolerating meds.    05/23/22 appt noted: Patient was administered Spravato 84 mg intranasally today.  The patient experienced the typical dissociation which gradually resolved over the 2-hour period of observation.  There were no complications.  Specifically the patient did not have nausea or vomiting.   Dissociation was greater than last dose and a little bothersome DT some HA. Tolerating meds.   Mood has not changed significantly thus far with Spravato.  05/27/22 appt noted: Patient was administered Spravato 84 mg intranasally today.  The patient experienced the typical dissociation which gradually resolved over the 2-hour period of observation.  There were no complications.  Specifically the patient did not have nausea or vomiting or headache. Today she has felt a little relief from the pressing heaviness of depression but a little more anxiety.  During administration  of Spravato she listens to [place] music and was able to relax a little more with the feeling of dissociation that was mildly bothersome. Husband was also in on the session today and asked some questions about IV ketamine versus nasal spray ketamine.  05/29/22 appt noted: Cancelled today DT hypertension  05/30/22 appt noted: Patient was administered Spravato 84 mg intranasally today.  The patient experienced the typical dissociation which gradually resolved over the 2-hour period of observation.  There were no complications.  Specifically the patient did not have nausea or vomiting or headache. She is seeing some improvement in mood with less continuous depression and less intensity.  Hopefulness is better.   No med complaints or SE  06/05/22 appt noted: Patient was administered Spravato 84 mg intranasally today.  The patient experienced the typical dissociation which gradually resolved over the 2-hour period of observation.  There were no complications.  Specifically the patient did not have nausea or vomiting or headache.  Specifically HA is less of a problem with Spravato. No SE with meds. Depression is improving with less intensity.  Intermittent anxiety easily.  More able to enjoy things.  No new concerns.  06/17/22 appt noted: Patient was administered Spravato 84 mg intranasally today.  The patient experienced the typical dissociation which gradually resolved over the 2-hour period of observation.  There were no complications.  Specifically the patient did not have nausea or vomiting or headache.  Specifically HA is less of a problem with Spravato. No SE with meds. Depression is improved 45% with less intensity.  Intermittent anxiety less easily.  More able to enjoy things.  No new concerns.  More active.  Current meds: increased clonazepam to about 1.5 mg HS DT recent insomnia, lexapro 20, Dayvigo 10 mg HS, concerta 36 mg AM, trazodone 100 mg HS.  06/20/22 appt noted: Current meds: increased  clonazepam to about 1.5 mg HS DT recent insomnia, lexapro 20, Dayvigo 10 mg HS, concerta 36 mg AM, trazodone 100 mg HS. Patient was administered Spravato 84 mg intranasally today.  The patient experienced the typical dissociation which gradually resolved over the 2-hour period of observation.  There were no complications.  Specifically the patient did not have nausea or vomiting or headache.  Specifically HA is less of a problem with Spravato. No SE with meds. Depression is improved 45% with less intensity.  Intermittent anxiety less easily.  More able to enjoy things.  No new concerns.  More active.  06/23/22 appt noted:  Current meds: increased clonazepam to about 1.5 mg HS DT recent insomnia, lexapro 20, Dayvigo 10 mg HS, concerta 36 mg AM, trazodone 100 mg HS. Patient was administered Spravato 84 mg intranasally today.  The patient experienced the typical dissociation which gradually resolved over the 2-hour period of observation.  There were no complications.  Specifically the patient did not have nausea or vomiting or headache.  Specifically HA is less of a problem with Spravato. No SE with meds. Intensity of Spravato dissociation varies.  Last week very intesne and this week more typical.  Depression is continuing to improve with better interest and activity and motivation.  Gradual improvement.  Wants to continue.  06/25/22 appt noted: Current meds: increased clonazepam to about 1.5 mg HS DT recent insomnia, lexapro 20, Dayvigo 10 mg HS, concerta 36 mg AM, trazodone 100 mg HS. Patient was administered Spravato 84 mg intranasally today.  The patient experienced the typical dissociation which gradually resolved over the 2-hour period of observation.  There were no complications.  Specifically the patient did not have nausea or vomiting or headache.  Specifically HA is less of a problem with Spravato. No SE with meds. Intensity of Spravato dissociation varies.  No scary and well  tolerated. Continues to gradually improve with Spravato.  She is more motivated and enjoying things more.  H sees progress.  Wants to continue twice weekly until gets maximum response.  Dep is not gone yet.  06/30/22 appt noted:  seen with H Current meds: increased clonazepam to about 1.5 mg HS DT recent insomnia, lexapro 20, Dayvigo 10 mg HS, concerta 36 mg AM, trazodone 100 mg HS. Patient was administered Spravato 84 mg intranasally today.  The patient experienced the typical dissociation which gradually resolved over the 2-hour period of observation.  There were no complications.  Specifically the patient did not have nausea or vomiting or headache.  Specifically HA is less of a problem with Spravato. No SE with meds. Intensity of Spravato dissociation varies.  No scary and well tolerated. Continues to gradually improve with Spravato.  She is more motivated and enjoying things more.  H sees even more progress than she does; more active and smiling.  Wants to continue twice weekly until gets maximum response.  Dep is 80% better.  07/07/22 appt noted: Current meds: increased clonazepam to about 1.5 mg HS DT recent insomnia, lexapro 20, Dayvigo 10 mg HS, concerta 36 mg AM, trazodone 100 mg HS. Patient was administered Spravato 84 mg intranasally today.  The patient experienced the typical dissociation which gradually resolved over the 2-hour period of observation.  There were no complications.  Specifically the patient did not have nausea  or vomiting or headache.  Specifically HA is less of a problem with Spravato. No SE with meds. Intensity of Spravato dissociation varies.  No scary and well tolerated.  Was more intese today. Overall mood is markedly better and please with meds. No changes desired.  07/09/22 appt noted: In transition from Lexapro to sertraline and missing Concerta bc CO crash from it after about 4 hours.  Disc these concerns.  She'll discuss also with Corie Chiquito, NP More dep the  last couple of days and concerned about reducing Spravato frequency while transition from one antidep to another.  Affect less positive. Patient was administered Spravato 84 mg intranasally today.  The patient experienced the typical dissociation which gradually resolved over the 2-hour period of observation.  There were no complications.  Specifically the patient did not have nausea or vomiting or headache.  Specifically HA is less of a problem with Spravato. No SE with meds. Intensity of Spravato dissociation varies.  No scary and well tolerated.   07/14/22 appt noted: Has been more depressed this week.  More trouble with sleep.  Taking clonazepam 1-1.5  of the 0.5 mg tablets.   Trazodone not helping much. She switched back from 200 sertraline to Lexapro 20.  No SE More trouble with motivation.  Some stress with son with special needs. Misses the benevit of Concerta but it was too short acting and crashed in 4-6 hours. Patient was administered Spravato 84 mg intranasally today.  The patient experienced the typical dissociation which gradually resolved over the 2-hour period of observation.  There were no complications.  Specifically the patient did not have nausea or vomiting or headache.  Specifically HA is less of a problem with Spravato. No SE with meds. Intensity of Spravato dissociation varies.  No scary and well tolerated.   07/17/22 appt noted: Patient was administered Spravato 84 mg intranasally today.  The patient experienced the typical dissociation which gradually resolved over the 2-hour period of observation.  There were no complications.  Specifically the patient did not have nausea or vomiting or headache.  Specifically HA is less of a problem with Spravato. No SE with meds. Intensity of Spravato dissociation varies.  Not scary and well tolerated.  Just got Jornay last night but wasn't sure how to take it.  Wants to start and this was discussed.  She felt MPH helped mood and attn but  didn't last long enough and had crash after a few hours on Concerta.  Better for 2 hours after each dose Ritalin 20 mg with mood but then it wore off.  BP up after 3rd dose 160/90 and then took H's amlodipine 5 and BP became normal.  07/22/22 appt noted: Patient was administered Spravato 84 mg intranasally today.  The patient experienced the typical dissociation which gradually resolved over the 2-hour period of observation.  There were no complications.  Specifically the patient did not have nausea or vomiting or headache.  Specifically HA is less of a problem with Spravato. No SE with meds. Intensity of Spravato dissociation varies.  Not scary and well tolerated.  Start Jornay 20 mg over the weekend and did not notice any particular effect good or bad.  Increased to 40 mg. Other psych meds: Clonazepam 0.5 mg tablets 2 nightly, gabapentin dosage varies, Dayvigo 10 mg nightly, Lexapro 20 mg daily, to trazodone 100 mg nightly  07/24/22 appt noted: Patient was administered Spravato 84 mg intranasally today.  The patient experienced the typical dissociation which gradually resolved over  the 2-hour period of observation.  There were no complications.  Specifically the patient did not have nausea or vomiting or headache.  Specifically HA is less of a problem with Spravato. No SE with meds. Intensity of Spravato dissociation varies.  Not scary and well tolerated.  Other psych meds: Clonazepam 0.5 mg tablets 2 nightly, gabapentin dosage varies, Dayvigo 10 mg nightly, Lexapro 20 mg daily, to trazodone 100 mg nightly, Jornay 40 mg PM Doing well with Spravato.  Noticed a little effect from the Jornay 40 without SE but would like to increase the dose for ADD and mood.  Sleeping well.  No new concerns.  Still more depressed than she was with th initial benefit of Spravato.  07/30/22 note:  Patient was administered Spravato 84 mg intranasally today.  The patient experienced the typical dissociation which gradually  resolved over the 2-hour period of observation.  There were no complications.  Specifically the patient did not have nausea or vomiting or headache.  Specifically HA is less of a problem with Spravato. Other psych meds: Clonazepam 0.5 mg tablets 2 nightly, gabapentin dosage varies, Dayvigo 10 mg nightly, Lexapro 20 mg daily, to trazodone 100 mg nightly, Jornay 60 mg PM Wants to increase Jornay to 80 mg PM to increase benefit for ADD and depression. No SE She has experienced a major family crisis that threatens to change her daily life from now on.  It has not triggered suicidal thoughts at this time but she is very afraid.  No desire to change medications.  08/04/22 appt noted" Patient was administered Spravato 84 mg intranasally today.  The patient experienced the typical dissociation which gradually resolved over the 2-hour period of observation.  There were no complications.  Specifically the patient did not have nausea or vomiting or headache.  Specifically HA is less of a problem with Spravato. Other psych meds: Clonazepam 0.5 mg tablets 2 nightly, gabapentin dosage varies, Dayvigo 10 mg nightly, Lexapro 20 mg daily, trazodone 100 mg nightly, Jornay 80 mg PM No SE No benefit or SE with increase Jormay 80 mg.  Says Concerta helped ADD and depression but not Jornay.  However duration was insufficient.  Still depressed and wants change to another form of stimulant.  No concerns with other meds. Continues with strong family stressors creating uncertainty about her future but no quick resolution. No SI Trial Cotempla 17.3 and DC Ritalin & Jornay 80  08/11/2022 appointment noted: Patient was administered Spravato 84 mg intranasally today.  The patient experienced the typical dissociation which gradually resolved over the 2-hour period of observation.  There were no complications.  Specifically the patient did not have nausea or vomiting or headache.  Specifically HA is less of a problem with Spravato now  vs when started it.. Other psych meds: Clonazepam 0.5 mg tablets 2 nightly, gabapentin dosage varies, Dayvigo 10 mg nightly, trazodone 100 mg nightly, cotempla 17.3, switch from Lexapro 20 to sertaline 15o mg . No noticeable effects sertraline from for depression but has seen some antianxiety effects from the sertraline.  No side effects noted.  No side effects with the other medicines.  Still is only getting very brief benefit 3 to 4 hours from Cotempla for ADD and mood.  She wants to try the Daytrana patch as we have discussed before. No SE  08/13/22 appt: Patient was administered Spravato 84 mg intranasally today.  The patient experienced the typical dissociation which gradually resolved over the 2-hour period of observation.  There were no complications.  Specifically the patient did not have nausea or vomiting or headache.  Specifically HA is less of a problem with Spravato now vs when started it.. Other psych meds: Clonazepam 0.5 mg tablets 2 nightly, gabapentin dosage varies, Dayvigo 10 mg nightly, trazodone 100 mg nightly, cotempla 17.3, switch from Lexapro 20 to sertaline 15o mg . No noticeable effects sertraline from for depression but has seen some antianxiety effects from the sertraline.  No side effects noted.  No side effects with the other medicines.  Still is only getting very brief benefit 3 to 4 hours from Cotempla for ADD and mood.  She wants to try the Daytrana patch as we have discussed before.  Hasn't been able to get it yet. No SE Plan.  Continue to try to get Daytrana 30 AM  08/18/22 appt noted: Patient was administered Spravato 84 mg intranasally today.  The patient experienced the typical dissociation which gradually resolved over the 2-hour period of observation.  There were no complications.  Specifically the patient did not have nausea or vomiting or headache.  Specifically HA is less of a problem with Spravato now vs when started it.. Other psych meds: Clonazepam 0.5 mg  tablets 2 nightly, gabapentin dosage varies, Dayvigo 10 mg nightly, trazodone 100 mg nightly, cotempla 17.3, switch from Lexapro 20 to sertaline 15o mg . No noticeable effects sertraline from for depression but has seen some antianxiety effects from the sertraline.  No side effects noted.  No side effects with the other medicines.  Still is only getting very brief benefit 3 to 4 hours from Cotempla for ADD and mood.  She wants to try the Daytrana patch as we have discussed before.  Hasn't been able to get it yet.  Overall depression is some better than it was.   No SE Plan.  Continue to try to get Daytrana 30 AM  08/25/22 appt : No benefit Daytrana.  No SE.  Wants further med changes and interested In retrying pramipexole.   Tolerating meds. Patient was administered Spravato 84 mg intranasally today.  The patient experienced the typical dissociation which gradually resolved over the 2-hour period of observation.  There were no complications.  Specifically the patient did not have nausea or vomiting or headache.  Specifically HA is less of a problem with Spravato now vs when started it.. Other psych meds: Clonazepam 0.5 mg tablets 2 nightly, gabapentin dosage varies, Dayvigo 10 mg nightly, trazodone 100 mg nightly, , switch from Lexapro 20 to sertaline 15o mg . Daytrana 30 AM for a few days. Struggles with depression and no SI.  Reduced interest and mood and motivation and enjoyment and socialization.  08/27/22 appt noted: Psych meds:  Clonazepam 0.5 mg tablets 2 nightly, gabapentin dosage varies, Dayvigo 10 mg nightly, trazodone 100 mg nightly,  sertaline 15o mg . Returned to [employer] AM, pramipexole  Tolerating meds. Patient was administered Spravato 84 mg intranasally today.  The patient experienced the typical dissociation which gradually resolved over the 2-hour period of observation.  There were no complications.  Specifically the patient did not have nausea or vomiting or headache.   Reports  taking cotempla bc brief mood benefit and sig focus and productivity benefit. Took 1 dose pramipexole and felt more dep.  09/01/22 appt noted: Psych meds:  Clonazepam 0.5 mg tablets 2 nightly, gabapentin dosage varies, Dayvigo 10 mg nightly, trazodone 100 mg nightly,  sertaline 15o mg . Returned to Cotempla AM, pramipexole  0.25 mg BID. Tolerating meds now. Patient was administered  Spravato 84 mg intranasally today.  The patient experienced the typical dissociation which gradually resolved over the 2-hour period of observation.  There were no complications.  Specifically the patient did not have nausea or vomiting or headache.   Willing to try the pramipexole and will continue 0.25 mg BID this week and then consider increase if tolerated.  Mood is a little better.  Still looking for further improvement.  09/03/22 appt noted: Psych meds:  Clonazepam 0.5 mg tablets 2 nightly, gabapentin dosage varies, Dayvigo 10 mg nightly, trazodone 100 mg nightly,  sertaline 15o mg . Returned to Cotempla AM, pramipexole  0.25 mg tablet 1 and 1/2 tablets twice daily BID. Tolerating meds now. Patient was administered Spravato 84 mg intranasally today.  The patient experienced the typical dissociation which gradually resolved over the 2-hour period of observation.  There were no complications.  Specifically the patient did not have nausea or vomiting or headache.   Depression may be a little better with the parmipexole and is tolerating it well so far.  Still struggling with focus and residual depression.  Tolerating meds without SE.  More hopeful and able to enjoy some things.  09/08/22 appt  Psych meds:  Clonazepam 0.5 mg tablets 2 nightly, gabapentin dosage varies, Dayvigo 10 mg nightly, trazodone 100 mg nightly,  sertaline 15o mg . Returned to Cotempla AM, pramipexole  0.5 mg BID. Tolerating meds now. Patient was administered Spravato 84 mg intranasally today.  The patient experienced the typical dissociation which  gradually resolved over the 2-hour period of observation.  There were no complications.  Specifically the patient did not have nausea or vomiting or headache.   She has seen her depression drop from a 10/10 prior to treatment with Spravato to now 4/10 With additional benefit noted when she increased the pramipexole to 0.5 mg twice daily.  She is not having side effects with this like she did in the past. Anxiety levels are also improved. She is sleeping okay with the current medications. Plan: continue pramipexole trial off-label and increase more slowly for TRD.  Increase  Gradually  to 0.75 mg BID  09/11/22 appt noted: Psych meds:  Clonazepam 0.5 mg tablets 2 nightly, gabapentin dosage varies, Dayvigo 10 mg nightly, trazodone 100 mg nightly,  sertaline 15o mg . Returned to Cotempla AM, pramipexole  0.5 mg BID. Tolerating meds now. Patient was administered Spravato 84 mg intranasally today.  The patient experienced the typical dissociation which gradually resolved over the 2-hour period of observation.  There were no complications.  Specifically the patient did not have nausea or vomiting or headache.   She has seen her depression drop from a 10/10 prior to treatment with Spravato to now 4/10 With additional benefit noted when she increased the pramipexole to 0.5 mg twice daily.  She is not having side effects with this like she did in the past. Clearly improving with the pramipexole now and tolerating it.  Satisfied with current meds but would consider increasing if it might be helpful.   AIMS    Flowsheet Row Video Visit from 01/03/2022 in [facility]  AIMS Total Score 0      ECT-MADRS    Flowsheet Row Clinical Support from 08/13/2022 in [facility] Video Visit from 04/29/2022 in [address] [facility] Video Visit from 02/06/2022 in [facility]  MADRS Total Score 27 [phone] 23      GAD-7     Flowsheet Row  Office Visit from 08/22/2022 in [facility] [place]  Total GAD-7 Score 7      PHQ2-9    Flowsheet Row Office Visit from 08/22/2022 in [facility] [place] Office Visit from 08/19/2022 in [facility] Video Visit from 02/06/2022 in [facility] Office Visit from 11/16/2020 in [facility] [place] Office Visit from 04/19/2018 in [facility] Total Score 3 0 6 0 0  PHQ-9 Total Score 5 -- 14 -- 2        Past Psychiatric Medication Trials: Trintellix - Initially effective and then was not effective when re-started Paxil- Ineffective. Helped initially. Prozac-Insomnia Lexapro- Effective and then no longer as effective Zoloft- Initially effective and then not as effective.  Viibryd Cymbalta- Ineffective. Helped initially. Effexor XR- Effective, well tolerated. Pristiq- Increased anxiety at 150 mg daily Wellbutrin XL-Ineffective.May have increased anxiety. Amitriptyline Nortriptyline-Caused irritability, constipation Auvelity- ineffective  Abilify- Helpful but caused severe insomnia. Rexulti-Helpful but caused insomnia. Vraylar Seroquel - Ineffective Risperdal Olanzapine- Ineffective Latuda- Ineffective Caplyta  Klonopin- Effective Temazepam- Took during menopause Lunesta- Ineffective Ambien-Ineffective Sonata- Ineffective Dayvigo- partially effective Trazodone- Ineffective Remeron-Ineffective. Does not recall wt gain.   Adderall- Took once and had adverse effect. Concerta- Effective but only 4-6 hours Jornay 80 NR Metadate-Not as effective as Concerta Dexmethylphenidate- Not as effective as Concerta Azstarys- some side effects. Not as effective.  Ritalin short acting and SE anxiety & HTN @20  TID Cotemplay 17.3  Daytrana  Lamictal- May have had an  adverse effects. "Felt weird." Reports worsening s/s.  Lithium Gabapentin Pramipexole- "felt really weird."   ECT #20 with minimal response. H says it was partly helpful.  AIMS    Flowsheet Row Video Visit from 01/03/2022 in [facility]  AIMS Total Score 0      ECT-MADRS    Flowsheet Row Clinical Support from 08/13/2022 in [facility] Video Visit from 04/29/2022 in [facility] Video Visit from 02/06/2022 in [facility]  MADRS Total Score [phone]      GAD-7    Flowsheet Row Office Visit from 08/22/2022 in [facility] [place]  Total GAD-7 Score 7      PHQ2-9    Flowsheet Row Office Visit from 08/22/2022 in [facility] [place] Office Visit from 08/19/2022 in [facility] Video Visit from 02/06/2022 in [facility] Office Visit from 11/16/2020 in [facility] for [facility] [place] Office Visit from 04/19/2018 in [facility] Total Score 3 0 6 0 0  PHQ-9 Total Score 5 -- 14 -- 2        Review of Systems:  Review of Systems  Constitutional:  Positive for fatigue.  Cardiovascular:  Negative for palpitations.  Neurological:  Negative for tremors.  Psychiatric/Behavioral:  Positive for dysphoric mood. Negative for decreased concentration. The patient is nervous/anxious.     Medications: I have reviewed the patient's current medications.  Current Outpatient Medications  Medication Sig Dispense Refill   clonazePAM (KLONOPIN) 0.5 MG tablet 2 tablets at night and 1 tablet as needed daily for panic anxiety 75 tablet 1   Estradiol (VAGIFEM) 10 MCG TABS vaginal tablet Place 1 tablet (10 mcg total) vaginally 2 (  two) times a week. 24 tablet 3   gabapentin (NEURONTIN) 800 MG tablet Take  1 tablet (800 mg total) by mouth at bedtime. 90 tablet 1   MAGNESIUM PO Take by mouth.     Methylphenidate (COTEMPLA XR-ODT) 17.3 MG TBED Take 1 tablet (17.3 mg total) by mouth every morning. 30 tablet 0   Multiple Vitamin (MULTIVITAMIN) tablet Take 1 tablet by mouth daily.     pramipexole (MIRAPEX) 0.25 MG tablet 1 twice daily for 5 days then 1and 1/2 tablets twice daily (Patient taking differently: Take 0.5 mg by mouth 2 (two) times daily.) 90 tablet 0   sertraline (ZOLOFT) 100 MG tablet Take 2 tablets (200 mg total) by mouth daily. 180 tablet 0   traZODone (DESYREL) 100 MG tablet Take 1 tablet (100 mg total) by mouth at bedtime. Take 1-2 tabs po QHS prn 180 tablet 1   valACYclovir (VALTREX) 500 MG tablet Take one twice daily for 3 days with any symptoms 30 tablet 3   Esketamine HCl, 84 MG Dose, (SPRAVATO, 84 MG DOSE,) 28 MG/DEVICE SOPK USE 3 SPRAYS IN EACH NOSTRIL EVERY 3 DAYS 3 each 2   No current facility-administered medications for this visit.    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   TRANSAMINASES, SERUM, ELEVATED 08/14/2009    Past Medical History, Surgical history, Social history, and Family history were reviewed and updated as appropriate.   Please see review of systems for further details on the patient's review from today.   Objective:   Physical Exam:  LMP 12/27/2007 (Exact Date)   Physical Exam Constitutional:      General: She is not in acute distress. Musculoskeletal:        General: No deformity.  Neurological:     Mental Status: She is alert and oriented to person, place, and  time.     Coordination: Coordination normal.  Psychiatric:        Attention and Perception: Attention and perception normal. She does not perceive auditory hallucinations.        Mood and Affect: Mood is anxious and depressed. Affect is blunt. Affect is not labile.        Speech: Speech normal. Speech is not slurred.        Behavior: Behavior normal.        Thought Content: Thought content normal. Thought content is not delusional. Thought content does not include homicidal or suicidal ideation. Thought content does not include suicidal plan.        Cognition and Memory: Cognition and memory normal.        Judgment: Judgment normal.     Comments: Insight intact Less down than last week. Affect blunted less than in the past.     Lab Review:     Component Value Date/Time   NA 138 04/22/2022 0932   K 4.1 04/22/2022 0932   CL 98 04/22/2022 0932   CO2 31 04/22/2022 0932   GLUCOSE 102 (H) 04/22/2022 0932   BUN 10 04/22/2022 0932  CREATININE 0.71 04/22/2022 0932   CREATININE 0.68 01/26/2020 0920   CALCIUM 10.5 04/22/2022 0932   PROT 8.3 04/22/2022 0932   ALBUMIN 5.3 (H) 04/22/2022 0932   AST 82 (H) 04/22/2022 0932   ALT 118 (H) 04/22/2022 0932   ALKPHOS 74 04/22/2022 0932   BILITOT 0.3 04/22/2022 0932   GFRNONAA >90 05/01/2014 1645   GFRAA >90 05/01/2014 1645       Component Value Date/Time   WBC 3.3 (L) 04/22/2022 0932   RBC 4.25 04/22/2022 0932   HGB 14.3 04/22/2022 0932   HGB 14.1 10/26/2014 1034   HCT 40.7 04/22/2022 0932   PLT 245.0 04/22/2022 0932   MCV 95.7 04/22/2022 0932   MCH 34.4 (H) 01/26/2020 0920   MCHC 35.1 04/22/2022 0932   RDW 13.2 04/22/2022 0932   LYMPHSABS 0.9 04/22/2022 0932   MONOABS 0.2 04/22/2022 0932   EOSABS 0.1 04/22/2022 0932   BASOSABS 0.0 04/22/2022 0932    No results found for: "POCLITH", "LITHIUM"   No results found for: "PHENYTOIN", "PHENOBARB", "VALPROATE", "CBMZ"   .res Assessment: Plan:    Heather Davila was seen today for follow-up,  depression, anxiety, add and fatigue.  Diagnoses and all orders for this visit:  Recurrent major depression resistant to treatment [facility])  Generalized anxiety disorder  Attention deficit hyperactivity disorder (ADHD), predominantly inattentive type  Insomnia, unspecified type  Stressful life event affecting family     Pt seen for TRD and administration of Spravato.  Is benefitting markedly.  Also retrial with paroxetine 0.5 mg twice daily has been markedly helpful at reducing the depression from a 10/10 level to 4/10 level in combination with the Spravato.  She has gotten additional benefit since the pramipexole was added.  We discussed the pros and cons of going up another step to see if we could push the depression into remission.  She would like to try increasing the pramipexole to 0.75 mg twice daily.  We discussed side effects of this medication including the unusual side effect potential of impulsivity or compulsivity or manic type symptoms.  Patient was administered Spravato 84 mg intranasally today.  The patient experienced the typical dissociation which gradually resolved over the 2-hour period of observation.  There were no complications.  Specifically the patient did not have nausea or vomiting. But mild headache.  Blood pressures remained within normal ranges at the 40-minute and 2-hour follow-up intervals.  By the time the 2-hour observation period was met the patient was alert and oriented and able to exit without assistance.  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  See nursing note for further details.Tolerated the Spravato to 84mg  . Plan to continue twice daily to max response.  Regarding med options.  She has obviously been thoroughly tried on numerous medications.  The only antidepressant category that she has not taken is MAO inhibitors which are not compatible with Spravato.  The  only other options with reasonable chance of success might be to use the most successful antidepressants at higher than recommended maximum dose for treatment resistant status. Disc this with her previously.    We discussed the short-term risks associated with benzodiazepines including sedation and increased fall risk among others.  Discussed long-term side effect risk including dependence, potential withdrawal symptoms, and the potential eventual dose-related risk of dementia.  But recent studies from 2020 dispute this association between benzodiazepines and dementia risk. Newer studies in 2020 do not support an  association with dementia.  Continue sertaline to 200 mg daily.  It has helped anxiety but not depression.    for insomnia, clonazepam 1 mg HS. trazodone doesn't work well.  She wants to continue Cotempla or Concerta bc helps mood early in day and focus  benefit lasts longer.  Have not been able to get stimulant to be consitently effective throughout the day.  continue pramipexole trial off-label and increase more slowly for TRD 0.75 mg BID She is tolerating this without problems so far.  Clearly better with it.  Consider further increase   Crisis intervention around the events that have occurred in her family that may change her life from this point forward.  She is managing with expected emotional distress but not suicidal thought.  She does not desire any further medication changes except noted.  FU twice weekly Spravato until improvement maxes out then drop back to weekly.  She wants to continue twice weekly as long as possible.  Meredith Staggers, MD, DFAPA    Please see After Visit Summary for patient specific instructions.  Future Appointments  Date Time Provider Department Center  09/15/2022  1:00 PM Cottle, Steva Ready., MD [facility] None  09/15/2022  1:00 PM CP-NURSE [facility] None  09/17/2022  1:00 PM Cottle, Steva Ready., MD [facility] None  09/17/2022  1:00 PM CP-NURSE [facility] None   09/23/2022  1:00 PM Cottle, Steva Ready., MD [facility] None  09/23/2022  1:00 PM CP-NURSE [facility] None  09/25/2022  8:00 AM CP-NURSE [facility] None  09/25/2022  9:00 AM Cottle, Steva Ready., MD [facility] None  09/18/2023  8:15 AM Jerene Bears, MD [facility]    No orders of the defined types were placed in this encounter.   ------------------------------- T

## 2022-09-12 ENCOUNTER — Other Ambulatory Visit: Payer: Self-pay | Admitting: Psychiatry

## 2022-09-12 DIAGNOSIS — F339 Major depressive disorder, recurrent, unspecified: Secondary | ICD-10-CM

## 2022-09-15 ENCOUNTER — Ambulatory Visit: Payer: 59

## 2022-09-15 ENCOUNTER — Encounter: Payer: Self-pay | Admitting: Psychiatry

## 2022-09-15 ENCOUNTER — Ambulatory Visit (INDEPENDENT_AMBULATORY_CARE_PROVIDER_SITE_OTHER): Payer: 59 | Admitting: Psychiatry

## 2022-09-15 VITALS — BP 144/94 | HR 62

## 2022-09-15 DIAGNOSIS — F900 Attention-deficit hyperactivity disorder, predominantly inattentive type: Secondary | ICD-10-CM | POA: Diagnosis not present

## 2022-09-15 DIAGNOSIS — Z6379 Other stressful life events affecting family and household: Secondary | ICD-10-CM

## 2022-09-15 DIAGNOSIS — F339 Major depressive disorder, recurrent, unspecified: Secondary | ICD-10-CM | POA: Diagnosis not present

## 2022-09-15 DIAGNOSIS — F411 Generalized anxiety disorder: Secondary | ICD-10-CM

## 2022-09-15 DIAGNOSIS — G4700 Insomnia, unspecified: Secondary | ICD-10-CM | POA: Diagnosis not present

## 2022-09-15 NOTE — Progress Notes (Signed)
Heather Davila [id number] 1960-08-17 62 y.o.  Subjective:   Patient ID:  Heather Davila is a 62 y.o. (DOB Nov 14, 1960) female.  Chief Complaint:  Chief Complaint  Patient presents with   Follow-up   Depression   Anxiety   ADD   Stress    HPI Heather Davila presents to the office today for follow-up of treatment resistant recurrent depression, generalized anxiety disorder and insomnia.  Almost too numerous to count med failures. She is here for Spravato administration for her treatment resistant depression.  05/21/22 appt noted: Patient was administered first dose Spravato 56 mg intranasally today.  The patient experienced the typical dissociation which gradually resolved over the 2-hour period of observation.  There were no complications.  Specifically the patient did not have nausea or vomiting or headache.   Dissociation was mild. Did not experience any emotional relief from disabling depression.wants to increase Spravato to the usual dose.  She is aware insurance is not covering the costs at this time.  No SI acutely. Tolerating meds.    05/23/22 appt noted: Patient was administered Spravato 84 mg intranasally today.  The patient experienced the typical dissociation which gradually resolved over the 2-hour period of observation.  There were no complications.  Specifically the patient did not have nausea or vomiting.   Dissociation was greater than last dose and a little bothersome DT some HA. Tolerating meds.   Mood has not changed significantly thus far with Spravato.  05/27/22 appt noted: Patient was administered Spravato 84 mg intranasally today.  The patient experienced the typical dissociation which gradually resolved over the 2-hour period of observation.  There were no complications.  Specifically the patient did not have nausea or vomiting or headache. Today she has felt a little relief from the pressing heaviness of depression but a little more anxiety.  During administration  of Spravato she listens to [place] music and was able to relax a little more with the feeling of dissociation that was mildly bothersome. Husband was also in on the session today and asked some questions about IV ketamine versus nasal spray ketamine.  05/29/22 appt noted: Cancelled today DT hypertension  05/30/22 appt noted: Patient was administered Spravato 84 mg intranasally today.  The patient experienced the typical dissociation which gradually resolved over the 2-hour period of observation.  There were no complications.  Specifically the patient did not have nausea or vomiting or headache. She is seeing some improvement in mood with less continuous depression and less intensity.  Hopefulness is better.   No med complaints or SE  06/05/22 appt noted: Patient was administered Spravato 84 mg intranasally today.  The patient experienced the typical dissociation which gradually resolved over the 2-hour period of observation.  There were no complications.  Specifically the patient did not have nausea or vomiting or headache.  Specifically HA is less of a problem with Spravato. No SE with meds. Depression is improving with less intensity.  Intermittent anxiety easily.  More able to enjoy things.  No new concerns.  06/17/22 appt noted: Patient was administered Spravato 84 mg intranasally today.  The patient experienced the typical dissociation which gradually resolved over the 2-hour period of observation.  There were no complications.  Specifically the patient did not have nausea or vomiting or headache.  Specifically HA is less of a problem with Spravato. No SE with meds. Depression is improved 45% with less intensity.  Intermittent anxiety less easily.  More able to enjoy things.  No new concerns.  More active.  Current meds: increased clonazepam to about 1.5 mg HS DT recent insomnia, lexapro 20, Dayvigo 10 mg HS, concerta 36 mg AM, trazodone 100 mg HS.  06/20/22 appt noted: Current meds: increased  clonazepam to about 1.5 mg HS DT recent insomnia, lexapro 20, Dayvigo 10 mg HS, concerta 36 mg AM, trazodone 100 mg HS. Patient was administered Spravato 84 mg intranasally today.  The patient experienced the typical dissociation which gradually resolved over the 2-hour period of observation.  There were no complications.  Specifically the patient did not have nausea or vomiting or headache.  Specifically HA is less of a problem with Spravato. No SE with meds. Depression is improved 45% with less intensity.  Intermittent anxiety less easily.  More able to enjoy things.  No new concerns.  More active.  06/23/22 appt noted:  Current meds: increased clonazepam to about 1.5 mg HS DT recent insomnia, lexapro 20, Dayvigo 10 mg HS, concerta 36 mg AM, trazodone 100 mg HS. Patient was administered Spravato 84 mg intranasally today.  The patient experienced the typical dissociation which gradually resolved over the 2-hour period of observation.  There were no complications.  Specifically the patient did not have nausea or vomiting or headache.  Specifically HA is less of a problem with Spravato. No SE with meds. Intensity of Spravato dissociation varies.  Last week very intesne and this week more typical.  Depression is continuing to improve with better interest and activity and motivation.  Gradual improvement.  Wants to continue.  06/25/22 appt noted: Current meds: increased clonazepam to about 1.5 mg HS DT recent insomnia, lexapro 20, Dayvigo 10 mg HS, concerta 36 mg AM, trazodone 100 mg HS. Patient was administered Spravato 84 mg intranasally today.  The patient experienced the typical dissociation which gradually resolved over the 2-hour period of observation.  There were no complications.  Specifically the patient did not have nausea or vomiting or headache.  Specifically HA is less of a problem with Spravato. No SE with meds. Intensity of Spravato dissociation varies.  No scary and well  tolerated. Continues to gradually improve with Spravato.  She is more motivated and enjoying things more.  H sees progress.  Wants to continue twice weekly until gets maximum response.  Dep is not gone yet.  06/30/22 appt noted:  seen with H Current meds: increased clonazepam to about 1.5 mg HS DT recent insomnia, lexapro 20, Dayvigo 10 mg HS, concerta 36 mg AM, trazodone 100 mg HS. Patient was administered Spravato 84 mg intranasally today.  The patient experienced the typical dissociation which gradually resolved over the 2-hour period of observation.  There were no complications.  Specifically the patient did not have nausea or vomiting or headache.  Specifically HA is less of a problem with Spravato. No SE with meds. Intensity of Spravato dissociation varies.  No scary and well tolerated. Continues to gradually improve with Spravato.  She is more motivated and enjoying things more.  H sees even more progress than she does; more active and smiling.  Wants to continue twice weekly until gets maximum response.  Dep is 80% better.  07/07/22 appt noted: Current meds: increased clonazepam to about 1.5 mg HS DT recent insomnia, lexapro 20, Dayvigo 10 mg HS, concerta 36 mg AM, trazodone 100 mg HS. Patient was administered Spravato 84 mg intranasally today.  The patient experienced the typical dissociation which gradually resolved over the 2-hour period of observation.  There were no complications.  Specifically the patient did not have nausea  or vomiting or headache.  Specifically HA is less of a problem with Spravato. No SE with meds. Intensity of Spravato dissociation varies.  No scary and well tolerated.  Was more intese today. Overall mood is markedly better and please with meds. No changes desired.  07/09/22 appt noted: In transition from Lexapro to sertraline and missing Concerta bc CO crash from it after about 4 hours.  Disc these concerns.  She'll discuss also with Corie Chiquito, NP More dep the  last couple of days and concerned about reducing Spravato frequency while transition from one antidep to another.  Affect less positive. Patient was administered Spravato 84 mg intranasally today.  The patient experienced the typical dissociation which gradually resolved over the 2-hour period of observation.  There were no complications.  Specifically the patient did not have nausea or vomiting or headache.  Specifically HA is less of a problem with Spravato. No SE with meds. Intensity of Spravato dissociation varies.  No scary and well tolerated.   07/14/22 appt noted: Has been more depressed this week.  More trouble with sleep.  Taking clonazepam 1-1.5  of the 0.5 mg tablets.   Trazodone not helping much. She switched back from 200 sertraline to Lexapro 20.  No SE More trouble with motivation.  Some stress with son with special needs. Misses the benevit of Concerta but it was too short acting and crashed in 4-6 hours. Patient was administered Spravato 84 mg intranasally today.  The patient experienced the typical dissociation which gradually resolved over the 2-hour period of observation.  There were no complications.  Specifically the patient did not have nausea or vomiting or headache.  Specifically HA is less of a problem with Spravato. No SE with meds. Intensity of Spravato dissociation varies.  No scary and well tolerated.   07/17/22 appt noted: Patient was administered Spravato 84 mg intranasally today.  The patient experienced the typical dissociation which gradually resolved over the 2-hour period of observation.  There were no complications.  Specifically the patient did not have nausea or vomiting or headache.  Specifically HA is less of a problem with Spravato. No SE with meds. Intensity of Spravato dissociation varies.  Not scary and well tolerated.  Just got Jornay last night but wasn't sure how to take it.  Wants to start and this was discussed.  She felt MPH helped mood and attn but  didn't last long enough and had crash after a few hours on Concerta.  Better for 2 hours after each dose Ritalin 20 mg with mood but then it wore off.  BP up after 3rd dose 160/90 and then took H's amlodipine 5 and BP became normal.  07/22/22 appt noted: Patient was administered Spravato 84 mg intranasally today.  The patient experienced the typical dissociation which gradually resolved over the 2-hour period of observation.  There were no complications.  Specifically the patient did not have nausea or vomiting or headache.  Specifically HA is less of a problem with Spravato. No SE with meds. Intensity of Spravato dissociation varies.  Not scary and well tolerated.  Start Jornay 20 mg over the weekend and did not notice any particular effect good or bad.  Increased to 40 mg. Other psych meds: Clonazepam 0.5 mg tablets 2 nightly, gabapentin dosage varies, Dayvigo 10 mg nightly, Lexapro 20 mg daily, to trazodone 100 mg nightly  07/24/22 appt noted: Patient was administered Spravato 84 mg intranasally today.  The patient experienced the typical dissociation which gradually resolved over  the 2-hour period of observation.  There were no complications.  Specifically the patient did not have nausea or vomiting or headache.  Specifically HA is less of a problem with Spravato. No SE with meds. Intensity of Spravato dissociation varies.  Not scary and well tolerated.  Other psych meds: Clonazepam 0.5 mg tablets 2 nightly, gabapentin dosage varies, Dayvigo 10 mg nightly, Lexapro 20 mg daily, to trazodone 100 mg nightly, Jornay 40 mg PM Doing well with Spravato.  Noticed a little effect from the Jornay 40 without SE but would like to increase the dose for ADD and mood.  Sleeping well.  No new concerns.  Still more depressed than she was with th initial benefit of Spravato.  07/30/22 note:  Patient was administered Spravato 84 mg intranasally today.  The patient experienced the typical dissociation which gradually  resolved over the 2-hour period of observation.  There were no complications.  Specifically the patient did not have nausea or vomiting or headache.  Specifically HA is less of a problem with Spravato. Other psych meds: Clonazepam 0.5 mg tablets 2 nightly, gabapentin dosage varies, Dayvigo 10 mg nightly, Lexapro 20 mg daily, to trazodone 100 mg nightly, Jornay 60 mg PM Wants to increase Jornay to 80 mg PM to increase benefit for ADD and depression. No SE She has experienced a major family crisis that threatens to change her daily life from now on.  It has not triggered suicidal thoughts at this time but she is very afraid.  No desire to change medications.  08/04/22 appt noted" Patient was administered Spravato 84 mg intranasally today.  The patient experienced the typical dissociation which gradually resolved over the 2-hour period of observation.  There were no complications.  Specifically the patient did not have nausea or vomiting or headache.  Specifically HA is less of a problem with Spravato. Other psych meds: Clonazepam 0.5 mg tablets 2 nightly, gabapentin dosage varies, Dayvigo 10 mg nightly, Lexapro 20 mg daily, trazodone 100 mg nightly, Jornay 80 mg PM No SE No benefit or SE with increase Jormay 80 mg.  Says Concerta helped ADD and depression but not Jornay.  However duration was insufficient.  Still depressed and wants change to another form of stimulant.  No concerns with other meds. Continues with strong family stressors creating uncertainty about her future but no quick resolution. No SI Trial Cotempla 17.3 and DC Ritalin & Jornay 80  08/11/2022 appointment noted: Patient was administered Spravato 84 mg intranasally today.  The patient experienced the typical dissociation which gradually resolved over the 2-hour period of observation.  There were no complications.  Specifically the patient did not have nausea or vomiting or headache.  Specifically HA is less of a problem with Spravato now  vs when started it.. Other psych meds: Clonazepam 0.5 mg tablets 2 nightly, gabapentin dosage varies, Dayvigo 10 mg nightly, trazodone 100 mg nightly, cotempla 17.3, switch from Lexapro 20 to sertaline 15o mg . No noticeable effects sertraline from for depression but has seen some antianxiety effects from the sertraline.  No side effects noted.  No side effects with the other medicines.  Still is only getting very brief benefit 3 to 4 hours from Cotempla for ADD and mood.  She wants to try the Daytrana patch as we have discussed before. No SE  08/13/22 appt: Patient was administered Spravato 84 mg intranasally today.  The patient experienced the typical dissociation which gradually resolved over the 2-hour period of observation.  There were no complications.  Specifically the patient did not have nausea or vomiting or headache.  Specifically HA is less of a problem with Spravato now vs when started it.. Other psych meds: Clonazepam 0.5 mg tablets 2 nightly, gabapentin dosage varies, Dayvigo 10 mg nightly, trazodone 100 mg nightly, cotempla 17.3, switch from Lexapro 20 to sertaline 15o mg . No noticeable effects sertraline from for depression but has seen some antianxiety effects from the sertraline.  No side effects noted.  No side effects with the other medicines.  Still is only getting very brief benefit 3 to 4 hours from Cotempla for ADD and mood.  She wants to try the Daytrana patch as we have discussed before.  Hasn't been able to get it yet. No SE Plan.  Continue to try to get Daytrana 30 AM  08/18/22 appt noted: Patient was administered Spravato 84 mg intranasally today.  The patient experienced the typical dissociation which gradually resolved over the 2-hour period of observation.  There were no complications.  Specifically the patient did not have nausea or vomiting or headache.  Specifically HA is less of a problem with Spravato now vs when started it.. Other psych meds: Clonazepam 0.5 mg  tablets 2 nightly, gabapentin dosage varies, Dayvigo 10 mg nightly, trazodone 100 mg nightly, cotempla 17.3, switch from Lexapro 20 to sertaline 15o mg . No noticeable effects sertraline from for depression but has seen some antianxiety effects from the sertraline.  No side effects noted.  No side effects with the other medicines.  Still is only getting very brief benefit 3 to 4 hours from Cotempla for ADD and mood.  She wants to try the Daytrana patch as we have discussed before.  Hasn't been able to get it yet.  Overall depression is some better than it was.   No SE Plan.  Continue to try to get Daytrana 30 AM  08/25/22 appt : No benefit Daytrana.  No SE.  Wants further med changes and interested In retrying pramipexole.   Tolerating meds. Patient was administered Spravato 84 mg intranasally today.  The patient experienced the typical dissociation which gradually resolved over the 2-hour period of observation.  There were no complications.  Specifically the patient did not have nausea or vomiting or headache.  Specifically HA is less of a problem with Spravato now vs when started it.. Other psych meds: Clonazepam 0.5 mg tablets 2 nightly, gabapentin dosage varies, Dayvigo 10 mg nightly, trazodone 100 mg nightly, , switch from Lexapro 20 to sertaline 15o mg . Daytrana 30 AM for a few days. Struggles with depression and no SI.  Reduced interest and mood and motivation and enjoyment and socialization.  08/27/22 appt noted: Psych meds:  Clonazepam 0.5 mg tablets 2 nightly, gabapentin dosage varies, Dayvigo 10 mg nightly, trazodone 100 mg nightly,  sertaline 15o mg . Returned to [employer] AM, pramipexole  Tolerating meds. Patient was administered Spravato 84 mg intranasally today.  The patient experienced the typical dissociation which gradually resolved over the 2-hour period of observation.  There were no complications.  Specifically the patient did not have nausea or vomiting or headache.   Reports  taking cotempla bc brief mood benefit and sig focus and productivity benefit. Took 1 dose pramipexole and felt more dep.  09/01/22 appt noted: Psych meds:  Clonazepam 0.5 mg tablets 2 nightly, gabapentin dosage varies, Dayvigo 10 mg nightly, trazodone 100 mg nightly,  sertaline 15o mg . Returned to Cotempla AM, pramipexole  0.25 mg BID. Tolerating meds now. Patient was administered  Spravato 84 mg intranasally today.  The patient experienced the typical dissociation which gradually resolved over the 2-hour period of observation.  There were no complications.  Specifically the patient did not have nausea or vomiting or headache.   Willing to try the pramipexole and will continue 0.25 mg BID this week and then consider increase if tolerated.  Mood is a little better.  Still looking for further improvement.  09/03/22 appt noted: Psych meds:  Clonazepam 0.5 mg tablets 2 nightly, gabapentin dosage varies, Dayvigo 10 mg nightly, trazodone 100 mg nightly,  sertaline 15o mg . Returned to Cotempla AM, pramipexole  0.25 mg tablet 1 and 1/2 tablets twice daily BID. Tolerating meds now. Patient was administered Spravato 84 mg intranasally today.  The patient experienced the typical dissociation which gradually resolved over the 2-hour period of observation.  There were no complications.  Specifically the patient did not have nausea or vomiting or headache.   Depression may be a little better with the parmipexole and is tolerating it well so far.  Still struggling with focus and residual depression.  Tolerating meds without SE.  More hopeful and able to enjoy some things.  09/08/22 appt  Psych meds:  Clonazepam 0.5 mg tablets 2 nightly, gabapentin dosage varies, Dayvigo 10 mg nightly, trazodone 100 mg nightly,  sertaline 15o mg . Returned to Cotempla AM, pramipexole  0.5 mg BID. Tolerating meds now. Patient was administered Spravato 84 mg intranasally today.  The patient experienced the typical dissociation which  gradually resolved over the 2-hour period of observation.  There were no complications.  Specifically the patient did not have nausea or vomiting or headache.   She has seen her depression drop from a 10/10 prior to treatment with Spravato to now 4/10 With additional benefit noted when she increased the pramipexole to 0.5 mg twice daily.  She is not having side effects with this like she did in the past. Anxiety levels are also improved. She is sleeping okay with the current medications. Plan: continue pramipexole trial off-label and increase more slowly for TRD.  Increase  Gradually  to 0.75 mg BID  09/11/22 appt noted: Psych meds:  Clonazepam 0.5 mg tablets 2 nightly, gabapentin dosage varies, Dayvigo 10 mg nightly, trazodone 100 mg nightly,  sertaline 15o mg . Returned to Cotempla AM, pramipexole  0.75 mg BID. Tolerating meds now. Patient was administered Spravato 84 mg intranasally today.  The patient experienced the typical dissociation which gradually resolved over the 2-hour period of observation.  There were no complications.  Specifically the patient did not have nausea or vomiting or headache.   She has seen her depression drop from a 10/10 prior to treatment with Spravato to now 4/10 With additional benefit noted when she increased the pramipexole to 0.5 mg twice daily.  She is not having side effects with this like she did in the past. Clearly improving with the pramipexole now and tolerating it.  Satisfied with current meds but would consider increasing if it might be helpful.  09/15/22 appt noted: Psych meds:  Clonazepam 0.5 mg tablets 2 nightly, gabapentin dosage varies, Dayvigo 10 mg nightly, trazodone 100 mg nightly,  sertaline 15o mg . Returned to Cotempla AM, pramipexole  0.75 mg BID too anxious and reduced to 0.5 mg BID. Tolerating meds now. Patient was administered Spravato 84 mg intranasally today.  The patient experienced the typical dissociation which gradually resolved over  the 2-hour period of observation.  There were no complications.  Specifically the patient  did not have nausea or vomiting or headache.   Trouble staying asleep longer than 6 hours.  Doesn't feel she can tolerate pramipexole 0.75 mg BID DT anxiety not experienced at  0.5 mg BID.  Otherwise tolerating meds.  Mood is better with Spravato and pramipexole.   Not sleeping as long with meds.  EMA 6 hours.  Wonders about change Disc concerns about waiting for therapy. Feels ready to reduce Spravato to weekly.    AIMS    Flowsheet Row Video Visit from 01/03/2022 in [facility]  AIMS Total Score 0      ECT-MADRS    Flowsheet Row Clinical Support from 08/13/2022 in [facility] Video Visit from 04/29/2022 in [facility] Video Visit from 02/06/2022 in [facility]  MADRS Total Score [phone] [id number]      GAD-7    Flowsheet Row Office Visit from 08/22/2022 in [facility] [place]  Total GAD-7 Score 7      PHQ2-9    Flowsheet Row Office Visit from 08/22/2022 in [facility] [place] Office Visit from 08/19/2022 in [facility] Video Visit from 02/06/2022 in [facility] Office Visit from 11/16/2020 in [facility] [place] Office Visit from 04/19/2018 in [facility] [place]  PHQ-2 Total Score 3 0 6 0 0  PHQ-9 Total Score 5 -- 14 -- 2        Past Psychiatric Medication Trials: Trintellix - Initially effective and then was not effective when re-started Paxil- Ineffective. Helped initially. Prozac-Insomnia Lexapro- Effective and then no longer as effective Zoloft- Initially effective and then not as effective.  Viibryd Cymbalta- Ineffective. Helped initially. Effexor XR- Effective, well  tolerated. Pristiq- Increased anxiety at 150 mg daily Wellbutrin XL-Ineffective.May have increased anxiety. Amitriptyline Nortriptyline-Caused irritability, constipation Auvelity- ineffective  Abilify- Helpful but caused severe insomnia. Rexulti-Helpful but caused insomnia. Vraylar Seroquel - Ineffective Risperdal Olanzapine- Ineffective Latuda- Ineffective Caplyta  Klonopin- Effective Temazepam- Took during menopause Lunesta- Ineffective Ambien-Ineffective Sonata- Ineffective Dayvigo- partially effective Trazodone- Ineffective Remeron-Ineffective. Does not recall wt gain.   Adderall- Took once and had adverse effect. Concerta- Effective but only 4-6 hours Jornay 80 NR Metadate-Not as effective as Concerta Dexmethylphenidate- Not as effective as Concerta Azstarys- some side effects. Not as effective.  Ritalin short acting and SE anxiety & HTN @20  TID Cotemplay 17.3  Daytrana  Lamictal- May have had an adverse effects. "Felt weird." Reports worsening s/s.  Lithium Gabapentin Pramipexole- "felt really weird."   ECT #20 with minimal response. H says it was partly helpful.  AIMS    Flowsheet Row Video Visit from 01/03/2022 in [facility]  AIMS Total Score 0      ECT-MADRS    Flowsheet Row Clinical Support from 08/13/2022 in [facility] Video Visit from 04/29/2022 in [facility] Video Visit from 02/06/2022 in [facility]  MADRS Total Score 27 44 23      GAD-7    Flowsheet Row Office Visit from 08/22/2022 in [facility] [place]  Total GAD-7 Score 7      PHQ2-9    Flowsheet Row Office Visit from 08/22/2022 in [facility] [place] Office Visit from 08/19/2022 in [facility] Video  Visit from 02/06/2022 in [facility]  Visit from 11/16/2020 in [facility] Office Visit from 04/19/2018 in [facility] Total Score 3 0 6 0 0  PHQ-9 Total Score 5 -- 14 -- 2        Review of Systems:  Review of Systems  Constitutional:  Positive for fatigue.  Cardiovascular:  Negative for palpitations.  Neurological:  Negative for tremors.  Psychiatric/Behavioral:  Positive for dysphoric mood. Negative for decreased concentration. The patient is nervous/anxious.     Medications: I have reviewed the patient's current medications.  Current Outpatient Medications  Medication Sig Dispense Refill   clonazePAM (KLONOPIN) 0.5 MG tablet 2 tablets at night and 1 tablet as needed daily for panic anxiety 75 tablet 1   Esketamine HCl, 84 MG Dose, (SPRAVATO, 84 MG DOSE,) 28 MG/DEVICE SOPK USE 3 SPRAYS IN EACH NOSTRIL EVERY 3 DAYS 3 each 2   Estradiol (VAGIFEM) 10 MCG TABS vaginal tablet Place 1 tablet (10 mcg total) vaginally 2 (two) times a week. 24 tablet 3   gabapentin (NEURONTIN) 800 MG tablet Take 1 tablet (800 mg total) by mouth at bedtime. 90 tablet 1   MAGNESIUM PO Take by mouth.     Methylphenidate (COTEMPLA XR-ODT) 17.3 MG TBED Take 1 tablet (17.3 mg total) by mouth every morning. 30 tablet 0   Multiple Vitamin (MULTIVITAMIN) tablet Take 1 tablet by mouth daily.     pramipexole (MIRAPEX) 0.25 MG tablet 1 twice daily for 5 days then 1and 1/2 tablets twice daily (Patient taking differently: Take 0.75 mg by mouth 2 (two) times daily.) 90 tablet 0   sertraline (ZOLOFT) 100 MG tablet Take 2 tablets (200 mg total) by mouth daily. 180 tablet 0   traZODone (DESYREL) 100 MG tablet Take 1 tablet (100 mg total) by mouth at bedtime. Take 1-2 tabs po QHS prn 180 tablet 1   valACYclovir (VALTREX) 500 MG tablet Take one twice daily for 3 days with any symptoms 30 tablet 3   No current facility-administered medications for this visit.    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   TRANSAMINASES, SERUM, ELEVATED 08/14/2009    Past Medical History, Surgical history, Social history, and Family history were reviewed and updated as appropriate.   Please see review of systems for further details on the patient's review from today.   Objective:   Physical Exam:  LMP 12/27/2007 (Exact Date)   Physical Exam Constitutional:      General: She is not in acute distress. Musculoskeletal:        General: No deformity.  Neurological:     Mental Status: She is alert and oriented to person, place, and time.     Coordination: Coordination normal.  Psychiatric:        Attention and Perception: Attention and perception normal. She does not perceive auditory hallucinations.        Mood and Affect: Mood is anxious and  depressed. Affect is not labile or blunt.        Speech: Speech normal. Speech is not slurred.        Behavior: Behavior normal.        Thought Content: Thought content normal. Thought content is not delusional. Thought content does not include homicidal or suicidal ideation. Thought content does not include suicidal plan.        Cognition and Memory: Cognition and memory normal.        Judgment: Judgment normal.     Comments: Insight intact Less down  Affect blunted less than in the past.     Lab Review:     Component Value Date/Time   NA 138 04/22/2022 0932   K 4.1 04/22/2022 0932   CL 98 04/22/2022 0932   CO2 31 04/22/2022 0932   GLUCOSE 102 (H) 04/22/2022 0932   BUN 10 04/22/2022 0932   CREATININE 0.71 04/22/2022 0932   CREATININE 0.68  01/26/2020 0920   CALCIUM 10.5 04/22/2022 0932   PROT 8.3 04/22/2022 0932   ALBUMIN 5.3 (H) 04/22/2022 0932   AST 82 (H) 04/22/2022 0932   ALT 118 (H) 04/22/2022 0932   ALKPHOS 74 04/22/2022 0932   BILITOT 0.3 04/22/2022 0932   GFRNONAA >90 05/01/2014 1645   GFRAA >90 05/01/2014 1645       Component Value Date/Time   WBC 3.3 (L) 04/22/2022 0932   RBC 4.25 04/22/2022 0932   HGB 14.3 04/22/2022 0932   HGB 14.1 10/26/2014 1034   HCT 40.7 04/22/2022 0932   PLT 245.0 04/22/2022 0932   MCV 95.7 04/22/2022 0932   MCH 34.4 (H) 01/26/2020 0920   MCHC 35.1 04/22/2022 0932   RDW 13.2 04/22/2022 0932   LYMPHSABS 0.9 04/22/2022 0932   MONOABS 0.2 04/22/2022 0932   EOSABS 0.1 04/22/2022 0932   BASOSABS 0.0 04/22/2022 0932    No results found for: "POCLITH", "LITHIUM"   No results found for: "PHENYTOIN", "PHENOBARB", "VALPROATE", "CBMZ"   .res Assessment: Plan:    Ani was seen today for follow-up, depression, anxiety, add and stress.  Diagnoses and all orders for this visit:  Recurrent major depression resistant to treatment [facility])  Generalized anxiety disorder  Attention deficit hyperactivity disorder (ADHD), predominantly inattentive type  Insomnia, unspecified type  Stressful life event affecting family     Pt seen for TRD and administration of Spravato.  Is benefitting markedly.  Also retrial with paroxetine 0.5 mg twice daily has been markedly helpful at reducing the depression from a 10/10 level to 3/10 level in combination with the Spravato.  She has gotten additional benefit since the pramipexole was added. we discussed side effects of this medication including the unusual side effect potential of impulsivity or compulsivity or manic type symptoms.  Patient was administered Spravato 84 mg intranasally today.  The patient experienced the typical dissociation which gradually resolved over the 2-hour period of observation.  There were no complications.  Specifically the  patient did not have nausea or vomiting. But mild headache.  Blood pressures remained within normal ranges at the 40-minute and 2-hour follow-up intervals.  By the time the 2-hour observation period was met the patient was alert and oriented and able to exit without assistance.  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  See nursing note for further details.Tolerated the Spravato to 84mg  . OK to reduce Spravato to weekly.  Watch for worsening.  Regarding med options.  She has obviously been thoroughly tried on numerous medications.  The only antidepressant category that she has not taken is MAO inhibitors which are not compatible with Spravato.  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 Disc this with her previously.    We discussed the short-term risks associated with benzodiazepines including sedation and increased fall risk among others.  Discussed long-term side effect risk including dependence, potential withdrawal symptoms, and the potential eventual dose-related risk of dementia.  But recent studies from 2020 dispute this association between benzodiazepines and dementia risk. Newer studies in 2020 do not support an association with dementia.  Continue sertaline to 200 mg daily.  It has helped anxiety but not depression.    for insomnia, clonazepam 1 mg HS. trazodone doesn't work well.  She wants to continue Cotempla or Concerta bc helps mood early in day and focus  benefit lasts longer.  Have not been able to get stimulant to be consitently effective throughout the day.  continue pramipexole trial off-label and reduce back to  TRD 0.5 mg BID She is tolerating this without problems so far.  Clearly better with it.   Crisis intervention around the events that have  occurred in her family that may change her life from this point forward.  She is managing with expected emotional distress but not suicidal thought.  She does not desire any further medication changes except noted.  FU weekly Spravato .  Meredith Staggers, MD, DFAPA    Please see After Visit Summary for patient specific instructions.  Future Appointments  Date Time Provider Department Center  09/25/2022  8:00 AM CP-NURSE [facility] None  09/25/2022  9:00 AM Cottle, Steva Ready., MD [facility] None  09/18/2023  8:15 AM Jerene Bears, MD [facility]    No orders of the defined types were placed in this encounter.   ------------------------------- T

## 2022-09-15 NOTE — Progress Notes (Signed)
NURSES NOTE:     Patient arrived for her #34 Spravato treatment. Pt is being treated for Treatment Resistant Depression, the starting dose is 56 mg (2 of the 28 mg) nasal sprays. Pt will be receiving 84 mg (3 of the 28 mg) nasal sprays, this is her maintenance dose. Patient taken to treatment room, I explained and discussed her treatment and how the schedule should go, along with any side effects that may occur. Answered any questions and concerns the patient had. Pt's Spravato is delivered through [place] and she is now billing with her insurance. Spravato medication is stored at doctors office per REMS/FDA guidelines. The medication is required to be locked behind two doors per FDA/REMS Protocol. Medication is also disposed of properly per regulations. All treatments and her vital signs are documented in Spravato REMS per protocol of treatment center regulations.      Vital Signs assessed at 1:05 PM 124/75, pulse 62, pulse ox 92%. Instructed pt to blow her nose and recline back slightly to prevent any of medication dripping out of her nose.  Pt. Given 1st dose (28 mg inhaler), then 5 minutes between 2nd dose and 3rd dose. No complaints of nausea/vomiting reported. Pt did listen to music today, she just reclined back and closed her eyes. After 40 minutes I went to assess her vital signs, no side effects or symptoms reported, B/P 143/89, pulse 65. Informed pt could rest in the chair Dr. Jennelle Human came in to discuss her treatment at the end. She asked about trying treatments once a week, I agreed and will let Dr. Jennelle Human know and discuss with pt. She also reports taking 3 of the Pramipexoles per day makes her more anxious so twice a day is working for her. She also reports her B/P has been up over the weekend, along with a headache. Her B/P is up today from her normal but no reports of a headache yet. Nurse was with pt a total of 60 minutes but pt observed for 120 minutes per protocol. Discharge vital signs were at  3:10 PM, B/P 144/94, pulse 62. She reports dissociation but she was clear upon her discharge. She scheduled her next apt  May 30 th. She is trying once a week treatments. Pt also given samples of Belsomra 15 mg X 3 packs, 20 mg X 2 packs. She will contact me with an update on how medication is working for her.   LOT # [id number] EXP JAN 2027

## 2022-09-16 ENCOUNTER — Other Ambulatory Visit: Payer: Self-pay | Admitting: Psychiatry

## 2022-09-16 DIAGNOSIS — F339 Major depressive disorder, recurrent, unspecified: Secondary | ICD-10-CM

## 2022-09-16 MED ORDER — PRAMIPEXOLE DIHYDROCHLORIDE 0.5 MG PO TABS: 0.5000 mg | ORAL_TABLET | Freq: Two times a day (BID) | ORAL | 0 refills | Status: DC

## 2022-09-17 ENCOUNTER — Encounter: Payer: 59 | Admitting: Psychiatry

## 2022-09-18 ENCOUNTER — Other Ambulatory Visit: Payer: Self-pay | Admitting: Psychiatry

## 2022-09-18 DIAGNOSIS — F339 Major depressive disorder, recurrent, unspecified: Secondary | ICD-10-CM

## 2022-09-19 ENCOUNTER — Telehealth: Payer: Self-pay

## 2022-09-19 ENCOUNTER — Other Ambulatory Visit: Payer: Self-pay | Admitting: Psychiatry

## 2022-09-19 MED ORDER — QUETIAPINE FUMARATE 25 MG PO TABS: 25.0000 mg | ORAL_TABLET | Freq: Every day | ORAL | 0 refills | Status: DC

## 2022-09-19 NOTE — Telephone Encounter (Signed)
She has TR insomnia and failed to respond to all the usual meds.  I cannot RX Dayvigo above 10 mg 1 nightly. Stop Trazodone, Belsomra and Dayvigo..  RX Seroquel 25 mg , 1 HS sent.

## 2022-09-19 NOTE — Telephone Encounter (Signed)
Pt called on Thursday, May 23rd to report the Belsomra 15 mg and 20 mg is not effective. She started taking Monday night so it has only been 3 nights. Pt reports she tried 2 trazodone when she woke up at 1:30 AM but that didn't help her fall asleep. She reports the only thing that helped was taking Dayvigo 10mg . She reports knowing Dayvigo 10 mg is only 1 at night but was asking if Dr. Jennelle Human would consider prescribing 1 to 2 tablets of Dayvigo 10 mg at night. She is requesting a refill of Dayvigo now. Her last refill was 08/04/2022 for #30.

## 2022-09-19 NOTE — Telephone Encounter (Signed)
Pt given information and what Dr. Jennelle Human recommends.

## 2022-09-23 ENCOUNTER — Encounter: Payer: 59 | Admitting: Psychiatry

## 2022-09-23 ENCOUNTER — Other Ambulatory Visit: Payer: Self-pay | Admitting: Psychiatry

## 2022-09-23 DIAGNOSIS — G4700 Insomnia, unspecified: Secondary | ICD-10-CM

## 2022-09-23 MED ORDER — DAYVIGO 10 MG PO TABS: 10.0000 mg | ORAL_TABLET | Freq: Every evening | ORAL | 0 refills | Status: DC

## 2022-09-23 NOTE — Telephone Encounter (Signed)
Pt called to report she is not able to sleep with the Seroquel that was sent in on 09/19/22 and asking for the Dayvigo 10 mg 1 at hs, she reports she does at least get 6 hours with that medication.   Okay to pend to her pharmacy?

## 2022-09-23 NOTE — Telephone Encounter (Signed)
Sent Dayvigo to [facility]

## 2022-09-25 ENCOUNTER — Encounter: Payer: Self-pay | Admitting: Psychiatry

## 2022-09-25 ENCOUNTER — Ambulatory Visit: Payer: 59

## 2022-09-25 ENCOUNTER — Ambulatory Visit (INDEPENDENT_AMBULATORY_CARE_PROVIDER_SITE_OTHER): Payer: 59 | Admitting: Psychiatry

## 2022-09-25 VITALS — BP 106/69 | HR 63

## 2022-09-25 DIAGNOSIS — F900 Attention-deficit hyperactivity disorder, predominantly inattentive type: Secondary | ICD-10-CM | POA: Diagnosis not present

## 2022-09-25 DIAGNOSIS — F411 Generalized anxiety disorder: Secondary | ICD-10-CM

## 2022-09-25 DIAGNOSIS — F339 Major depressive disorder, recurrent, unspecified: Secondary | ICD-10-CM

## 2022-09-25 DIAGNOSIS — G4700 Insomnia, unspecified: Secondary | ICD-10-CM | POA: Diagnosis not present

## 2022-09-25 NOTE — Progress Notes (Signed)
NURSES NOTE:     Patient arrived for her #35 weekly Spravato treatment. Pt is being treated for Treatment Resistant Depression, the starting dose is 56 mg (2 of the 28 mg) nasal sprays. Pt will be receiving 84 mg (3 of the 28 mg) nasal sprays, this is her maintenance dose. Patient taken to treatment room, I explained and discussed her treatment and how the schedule should go, along with any side effects that may occur. Answered any questions and concerns the patient had. Pt's Spravato is delivered through [place] and she is now billing with her insurance. Spravato medication is stored at doctors office per REMS/FDA guidelines. The medication is required to be locked behind two doors per FDA/REMS Protocol. Medication is also disposed of properly per regulations. All treatments and her vital signs are documented in Spravato REMS per protocol of treatment center regulations.      Vital Signs assessed at 8:05 AM 104/61, pulse 73, pulse ox 92%. Instructed pt to blow her nose and recline back slightly to prevent any of medication dripping out of her nose.  Pt. Given 1st dose (28 mg inhaler), then 5 minutes between 2nd dose and 3rd dose. No complaints of nausea/vomiting reported. Pt did listen to music today, she just reclined back and closed her eyes. After 40 minutes I went to assess her vital signs, no side effects or symptoms reported, B/P 104/60, pulse 64. Informed pt could rest in the chair Dr. Jennelle Human came in to discuss her treatment at the end. Nurse was with pt a total of 60 minutes but pt observed for 120 minutes per protocol. Discharge vital signs were at 9:53 AM, B/P 106/69, pulse 63. She reports dissociation but she was clear upon her discharge. She scheduled her next apt  June 6th. She is continuing once a week treatments. She is instructed to contact office if needing anything prior to her next treatment.     LOT # [id number] EXP FEB 2027

## 2022-09-25 NOTE — Progress Notes (Signed)
Heather Davila [id number] 1960/10/05 62 y.o.  Subjective:   Patient ID:  Heather Davila is a 62 y.o. (DOB 11-27-1960) female.  Chief Complaint:  Chief Complaint  Patient presents with   Follow-up   Depression   Anxiety   ADD   Sleeping Problem    HPI Heather Davila presents to the office today for follow-up of treatment resistant recurrent depression, generalized anxiety disorder and insomnia.  Almost too numerous to count med failures. She is here for Spravato administration for her treatment resistant depression.  05/21/22 appt noted: Patient was administered first dose Spravato 56 mg intranasally today.  The patient experienced the typical dissociation which gradually resolved over the 2-hour period of observation.  There were no complications.  Specifically the patient did not have nausea or vomiting or headache.   Dissociation was mild. Did not experience any emotional relief from disabling depression.wants to increase Spravato to the usual dose.  She is aware insurance is not covering the costs at this time.  No SI acutely. Tolerating meds.    05/23/22 appt noted: Patient was administered Spravato 84 mg intranasally today.  The patient experienced the typical dissociation which gradually resolved over the 2-hour period of observation.  There were no complications.  Specifically the patient did not have nausea or vomiting.   Dissociation was greater than last dose and a little bothersome DT some HA. Tolerating meds.   Mood has not changed significantly thus far with Spravato.  05/27/22 appt noted: Patient was administered Spravato 84 mg intranasally today.  The patient experienced the typical dissociation which gradually resolved over the 2-hour period of observation.  There were no complications.  Specifically the patient did not have nausea or vomiting or headache. Today she has felt a little relief from the pressing heaviness of depression but a little more anxiety.  During  administration of Spravato she listens to [place] music and was able to relax a little more with the feeling of dissociation that was mildly bothersome. Husband was also in on the session today and asked some questions about IV ketamine versus nasal spray ketamine.  05/29/22 appt noted: Cancelled today DT hypertension  05/30/22 appt noted: Patient was administered Spravato 84 mg intranasally today.  The patient experienced the typical dissociation which gradually resolved over the 2-hour period of observation.  There were no complications.  Specifically the patient did not have nausea or vomiting or headache. She is seeing some improvement in mood with less continuous depression and less intensity.  Hopefulness is better.   No med complaints or SE  06/05/22 appt noted: Patient was administered Spravato 84 mg intranasally today.  The patient experienced the typical dissociation which gradually resolved over the 2-hour period of observation.  There were no complications.  Specifically the patient did not have nausea or vomiting or headache.  Specifically HA is less of a problem with Spravato. No SE with meds. Depression is improving with less intensity.  Intermittent anxiety easily.  More able to enjoy things.  No new concerns.  06/17/22 appt noted: Patient was administered Spravato 84 mg intranasally today.  The patient experienced the typical dissociation which gradually resolved over the 2-hour period of observation.  There were no complications.  Specifically the patient did not have nausea or vomiting or headache.  Specifically HA is less of a problem with Spravato. No SE with meds. Depression is improved 45% with less intensity.  Intermittent anxiety less easily.  More able to enjoy things.  No new concerns.  More  active. Current meds: increased clonazepam to about 1.5 mg HS DT recent insomnia, lexapro 20, Dayvigo 10 mg HS, concerta 36 mg AM, trazodone 100 mg HS.  06/20/22 appt noted: Current  meds: increased clonazepam to about 1.5 mg HS DT recent insomnia, lexapro 20, Dayvigo 10 mg HS, concerta 36 mg AM, trazodone 100 mg HS. Patient was administered Spravato 84 mg intranasally today.  The patient experienced the typical dissociation which gradually resolved over the 2-hour period of observation.  There were no complications.  Specifically the patient did not have nausea or vomiting or headache.  Specifically HA is less of a problem with Spravato. No SE with meds. Depression is improved 45% with less intensity.  Intermittent anxiety less easily.  More able to enjoy things.  No new concerns.  More active.  06/23/22 appt noted:  Current meds: increased clonazepam to about 1.5 mg HS DT recent insomnia, lexapro 20, Dayvigo 10 mg HS, concerta 36 mg AM, trazodone 100 mg HS. Patient was administered Spravato 84 mg intranasally today.  The patient experienced the typical dissociation which gradually resolved over the 2-hour period of observation.  There were no complications.  Specifically the patient did not have nausea or vomiting or headache.  Specifically HA is less of a problem with Spravato. No SE with meds. Intensity of Spravato dissociation varies.  Last week very intesne and this week more typical.  Depression is continuing to improve with better interest and activity and motivation.  Gradual improvement.  Wants to continue.  06/25/22 appt noted: Current meds: increased clonazepam to about 1.5 mg HS DT recent insomnia, lexapro 20, Dayvigo 10 mg HS, concerta 36 mg AM, trazodone 100 mg HS. Patient was administered Spravato 84 mg intranasally today.  The patient experienced the typical dissociation which gradually resolved over the 2-hour period of observation.  There were no complications.  Specifically the patient did not have nausea or vomiting or headache.  Specifically HA is less of a problem with Spravato. No SE with meds. Intensity of Spravato dissociation varies.  No scary and well  tolerated. Continues to gradually improve with Spravato.  She is more motivated and enjoying things more.  H sees progress.  Wants to continue twice weekly until gets maximum response.  Dep is not gone yet.  06/30/22 appt noted:  seen with H Current meds: increased clonazepam to about 1.5 mg HS DT recent insomnia, lexapro 20, Dayvigo 10 mg HS, concerta 36 mg AM, trazodone 100 mg HS. Patient was administered Spravato 84 mg intranasally today.  The patient experienced the typical dissociation which gradually resolved over the 2-hour period of observation.  There were no complications.  Specifically the patient did not have nausea or vomiting or headache.  Specifically HA is less of a problem with Spravato. No SE with meds. Intensity of Spravato dissociation varies.  No scary and well tolerated. Continues to gradually improve with Spravato.  She is more motivated and enjoying things more.  H sees even more progress than she does; more active and smiling.  Wants to continue twice weekly until gets maximum response.  Dep is 80% better.  07/07/22 appt noted: Current meds: increased clonazepam to about 1.5 mg HS DT recent insomnia, lexapro 20, Dayvigo 10 mg HS, concerta 36 mg AM, trazodone 100 mg HS. Patient was administered Spravato 84 mg intranasally today.  The patient experienced the typical dissociation which gradually resolved over the 2-hour period of observation.  There were no complications.  Specifically the patient did not have  nausea or vomiting or headache.  Specifically HA is less of a problem with Spravato. No SE with meds. Intensity of Spravato dissociation varies.  No scary and well tolerated.  Was more intese today. Overall mood is markedly better and please with meds. No changes desired.  07/09/22 appt noted: In transition from Lexapro to sertraline and missing Concerta bc CO crash from it after about 4 hours.  Disc these concerns.  She'll discuss also with Corie Chiquito, NP More dep the  last couple of days and concerned about reducing Spravato frequency while transition from one antidep to another.  Affect less positive. Patient was administered Spravato 84 mg intranasally today.  The patient experienced the typical dissociation which gradually resolved over the 2-hour period of observation.  There were no complications.  Specifically the patient did not have nausea or vomiting or headache.  Specifically HA is less of a problem with Spravato. No SE with meds. Intensity of Spravato dissociation varies.  No scary and well tolerated.   07/14/22 appt noted: Has been more depressed this week.  More trouble with sleep.  Taking clonazepam 1-1.5  of the 0.5 mg tablets.   Trazodone not helping much. She switched back from 200 sertraline to Lexapro 20.  No SE More trouble with motivation.  Some stress with son with special needs. Misses the benevit of Concerta but it was too short acting and crashed in 4-6 hours. Patient was administered Spravato 84 mg intranasally today.  The patient experienced the typical dissociation which gradually resolved over the 2-hour period of observation.  There were no complications.  Specifically the patient did not have nausea or vomiting or headache.  Specifically HA is less of a problem with Spravato. No SE with meds. Intensity of Spravato dissociation varies.  No scary and well tolerated.   07/17/22 appt noted: Patient was administered Spravato 84 mg intranasally today.  The patient experienced the typical dissociation which gradually resolved over the 2-hour period of observation.  There were no complications.  Specifically the patient did not have nausea or vomiting or headache.  Specifically HA is less of a problem with Spravato. No SE with meds. Intensity of Spravato dissociation varies.  Not scary and well tolerated.  Just got Jornay last night but wasn't sure how to take it.  Wants to start and this was discussed.  She felt MPH helped mood and attn but  didn't last long enough and had crash after a few hours on Concerta.  Better for 2 hours after each dose Ritalin 20 mg with mood but then it wore off.  BP up after 3rd dose 160/90 and then took H's amlodipine 5 and BP became normal.  07/22/22 appt noted: Patient was administered Spravato 84 mg intranasally today.  The patient experienced the typical dissociation which gradually resolved over the 2-hour period of observation.  There were no complications.  Specifically the patient did not have nausea or vomiting or headache.  Specifically HA is less of a problem with Spravato. No SE with meds. Intensity of Spravato dissociation varies.  Not scary and well tolerated.  Start Jornay 20 mg over the weekend and did not notice any particular effect good or bad.  Increased to 40 mg. Other psych meds: Clonazepam 0.5 mg tablets 2 nightly, gabapentin dosage varies, Dayvigo 10 mg nightly, Lexapro 20 mg daily, to trazodone 100 mg nightly  07/24/22 appt noted: Patient was administered Spravato 84 mg intranasally today.  The patient experienced the typical dissociation which gradually resolved  over the 2-hour period of observation.  There were no complications.  Specifically the patient did not have nausea or vomiting or headache.  Specifically HA is less of a problem with Spravato. No SE with meds. Intensity of Spravato dissociation varies.  Not scary and well tolerated.  Other psych meds: Clonazepam 0.5 mg tablets 2 nightly, gabapentin dosage varies, Dayvigo 10 mg nightly, Lexapro 20 mg daily, to trazodone 100 mg nightly, Jornay 40 mg PM Doing well with Spravato.  Noticed a little effect from the Jornay 40 without SE but would like to increase the dose for ADD and mood.  Sleeping well.  No new concerns.  Still more depressed than she was with th initial benefit of Spravato.  07/30/22 note:  Patient was administered Spravato 84 mg intranasally today.  The patient experienced the typical dissociation which gradually  resolved over the 2-hour period of observation.  There were no complications.  Specifically the patient did not have nausea or vomiting or headache.  Specifically HA is less of a problem with Spravato. Other psych meds: Clonazepam 0.5 mg tablets 2 nightly, gabapentin dosage varies, Dayvigo 10 mg nightly, Lexapro 20 mg daily, to trazodone 100 mg nightly, Jornay 60 mg PM Wants to increase Jornay to 80 mg PM to increase benefit for ADD and depression. No SE She has experienced a major family crisis that threatens to change her daily life from now on.  It has not triggered suicidal thoughts at this time but she is very afraid.  No desire to change medications.  08/04/22 appt noted" Patient was administered Spravato 84 mg intranasally today.  The patient experienced the typical dissociation which gradually resolved over the 2-hour period of observation.  There were no complications.  Specifically the patient did not have nausea or vomiting or headache.  Specifically HA is less of a problem with Spravato. Other psych meds: Clonazepam 0.5 mg tablets 2 nightly, gabapentin dosage varies, Dayvigo 10 mg nightly, Lexapro 20 mg daily, trazodone 100 mg nightly, Jornay 80 mg PM No SE No benefit or SE with increase Jormay 80 mg.  Says Concerta helped ADD and depression but not Jornay.  However duration was insufficient.  Still depressed and wants change to another form of stimulant.  No concerns with other meds. Continues with strong family stressors creating uncertainty about her future but no quick resolution. No SI Trial Cotempla 17.3 and DC Ritalin & Jornay 80  08/11/2022 appointment noted: Patient was administered Spravato 84 mg intranasally today.  The patient experienced the typical dissociation which gradually resolved over the 2-hour period of observation.  There were no complications.  Specifically the patient did not have nausea or vomiting or headache.  Specifically HA is less of a problem with Spravato now  vs when started it.. Other psych meds: Clonazepam 0.5 mg tablets 2 nightly, gabapentin dosage varies, Dayvigo 10 mg nightly, trazodone 100 mg nightly, cotempla 17.3, switch from Lexapro 20 to sertaline 15o mg . No noticeable effects sertraline from for depression but has seen some antianxiety effects from the sertraline.  No side effects noted.  No side effects with the other medicines.  Still is only getting very brief benefit 3 to 4 hours from Cotempla for ADD and mood.  She wants to try the Daytrana patch as we have discussed before. No SE  08/13/22 appt: Patient was administered Spravato 84 mg intranasally today.  The patient experienced the typical dissociation which gradually resolved over the 2-hour period of observation.  There were no  complications.  Specifically the patient did not have nausea or vomiting or headache.  Specifically HA is less of a problem with Spravato now vs when started it.. Other psych meds: Clonazepam 0.5 mg tablets 2 nightly, gabapentin dosage varies, Dayvigo 10 mg nightly, trazodone 100 mg nightly, cotempla 17.3, switch from Lexapro 20 to sertaline 15o mg . No noticeable effects sertraline from for depression but has seen some antianxiety effects from the sertraline.  No side effects noted.  No side effects with the other medicines.  Still is only getting very brief benefit 3 to 4 hours from Cotempla for ADD and mood.  She wants to try the Daytrana patch as we have discussed before.  Hasn't been able to get it yet. No SE Plan.  Continue to try to get Daytrana 30 AM  08/18/22 appt noted: Patient was administered Spravato 84 mg intranasally today.  The patient experienced the typical dissociation which gradually resolved over the 2-hour period of observation.  There were no complications.  Specifically the patient did not have nausea or vomiting or headache.  Specifically HA is less of a problem with Spravato now vs when started it.. Other psych meds: Clonazepam 0.5 mg  tablets 2 nightly, gabapentin dosage varies, Dayvigo 10 mg nightly, trazodone 100 mg nightly, cotempla 17.3, switch from Lexapro 20 to sertaline 15o mg . No noticeable effects sertraline from for depression but has seen some antianxiety effects from the sertraline.  No side effects noted.  No side effects with the other medicines.  Still is only getting very brief benefit 3 to 4 hours from Cotempla for ADD and mood.  She wants to try the Daytrana patch as we have discussed before.  Hasn't been able to get it yet.  Overall depression is some better than it was.   No SE Plan.  Continue to try to get Daytrana 30 AM  08/25/22 appt : No benefit Daytrana.  No SE.  Wants further med changes and interested In retrying pramipexole.   Tolerating meds. Patient was administered Spravato 84 mg intranasally today.  The patient experienced the typical dissociation which gradually resolved over the 2-hour period of observation.  There were no complications.  Specifically the patient did not have nausea or vomiting or headache.  Specifically HA is less of a problem with Spravato now vs when started it.. Other psych meds: Clonazepam 0.5 mg tablets 2 nightly, gabapentin dosage varies, Dayvigo 10 mg nightly, trazodone 100 mg nightly, , switch from Lexapro 20 to sertaline 15o mg . Daytrana 30 AM for a few days. Struggles with depression and no SI.  Reduced interest and mood and motivation and enjoyment and socialization.  08/27/22 appt noted: Psych meds:  Clonazepam 0.5 mg tablets 2 nightly, gabapentin dosage varies, Dayvigo 10 mg nightly, trazodone 100 mg nightly,  sertaline 15o mg . Returned to [employer] AM, pramipexole  Tolerating meds. Patient was administered Spravato 84 mg intranasally today.  The patient experienced the typical dissociation which gradually resolved over the 2-hour period of observation.  There were no complications.  Specifically the patient did not have nausea or vomiting or headache.   Reports  taking cotempla bc brief mood benefit and sig focus and productivity benefit. Took 1 dose pramipexole and felt more dep.  09/01/22 appt noted: Psych meds:  Clonazepam 0.5 mg tablets 2 nightly, gabapentin dosage varies, Dayvigo 10 mg nightly, trazodone 100 mg nightly,  sertaline 15o mg . Returned to Cotempla AM, pramipexole  0.25 mg BID. Tolerating meds now. Patient  was administered Spravato 84 mg intranasally today.  The patient experienced the typical dissociation which gradually resolved over the 2-hour period of observation.  There were no complications.  Specifically the patient did not have nausea or vomiting or headache.   Willing to try the pramipexole and will continue 0.25 mg BID this week and then consider increase if tolerated.  Mood is a little better.  Still looking for further improvement.  09/03/22 appt noted: Psych meds:  Clonazepam 0.5 mg tablets 2 nightly, gabapentin dosage varies, Dayvigo 10 mg nightly, trazodone 100 mg nightly,  sertaline 15o mg . Returned to Cotempla AM, pramipexole  0.25 mg tablet 1 and 1/2 tablets twice daily BID. Tolerating meds now. Patient was administered Spravato 84 mg intranasally today.  The patient experienced the typical dissociation which gradually resolved over the 2-hour period of observation.  There were no complications.  Specifically the patient did not have nausea or vomiting or headache.   Depression may be a little better with the parmipexole and is tolerating it well so far.  Still struggling with focus and residual depression.  Tolerating meds without SE.  More hopeful and able to enjoy some things.  09/08/22 appt  Psych meds:  Clonazepam 0.5 mg tablets 2 nightly, gabapentin dosage varies, Dayvigo 10 mg nightly, trazodone 100 mg nightly,  sertaline 15o mg . Returned to Cotempla AM, pramipexole  0.5 mg BID. Tolerating meds now. Patient was administered Spravato 84 mg intranasally today.  The patient experienced the typical dissociation which  gradually resolved over the 2-hour period of observation.  There were no complications.  Specifically the patient did not have nausea or vomiting or headache.   She has seen her depression drop from a 10/10 prior to treatment with Spravato to now 4/10 With additional benefit noted when she increased the pramipexole to 0.5 mg twice daily.  She is not having side effects with this like she did in the past. Anxiety levels are also improved. She is sleeping okay with the current medications. Plan: continue pramipexole trial off-label and increase more slowly for TRD.  Increase  Gradually  to 0.75 mg BID  09/11/22 appt noted: Psych meds:  Clonazepam 0.5 mg tablets 2 nightly, gabapentin dosage varies, Dayvigo 10 mg nightly, trazodone 100 mg nightly,  sertaline 15o mg . Returned to Cotempla AM, pramipexole  0.75 mg BID. Tolerating meds now. Patient was administered Spravato 84 mg intranasally today.  The patient experienced the typical dissociation which gradually resolved over the 2-hour period of observation.  There were no complications.  Specifically the patient did not have nausea or vomiting or headache.   She has seen her depression drop from a 10/10 prior to treatment with Spravato to now 4/10 With additional benefit noted when she increased the pramipexole to 0.5 mg twice daily.  She is not having side effects with this like she did in the past. Clearly improving with the pramipexole now and tolerating it.  Satisfied with current meds but would consider increasing if it might be helpful.  09/15/22 appt noted: Psych meds:  Clonazepam 0.5 mg tablets 2 nightly, gabapentin dosage varies, Dayvigo 10 mg nightly, trazodone 100 mg nightly,  sertaline 15o mg . Returned to Cotempla AM, pramipexole  0.75 mg BID too anxious and reduced to 0.5 mg BID. Tolerating meds now. Patient was administered Spravato 84 mg intranasally today.  The patient experienced the typical dissociation which gradually resolved over  the 2-hour period of observation.  There were no complications.  Specifically  the patient did not have nausea or vomiting or headache.   Trouble staying asleep longer than 6 hours.  Doesn't feel she can tolerate pramipexole 0.75 mg BID DT anxiety not experienced at  0.5 mg BID.  Otherwise tolerating meds.  Mood is better with Spravato and pramipexole.   Not sleeping as long with meds.  EMA 6 hours.  Wonders about change Disc concerns about waiting for therapy. Feels ready to reduce Spravato to weekly.    09/25/22 appt noted: Psych meds:  Clonazepam 0.5 mg tablets 2 nightly, gabapentin dosage varies, Dayvigo 10 mg nightly, trazodone 100 mg nightly,  sertaline 15o mg . Returned to Cotempla AM, pramipexole  0.75 mg BID too anxious and reduced to 0.5 mg BID. Tolerating meds now. Patient was administered Spravato 84 mg intranasally today.  The patient experienced the typical dissociation which gradually resolved over the 2-hour period of observation.  There were no complications.  Specifically the patient did not have nausea or vomiting or headache.   Trouble staying asleep longer than 6 hours.  Failed to respond to Seroquel 25 mg HS.  Gone back to [facility].  Belsomra NR. Mood ok until the last few days more dep bc it's been 10 days since last admin.  Wants to continue weekly.   No SE.  No med changes desired.  AIMS    Flowsheet Row Video Visit from 01/03/2022 in [facility]  AIMS Total Score 0      ECT-MADRS    Flowsheet Row Clinical Support from 08/13/2022 in [facility] Video Visit from 04/29/2022 in [facility] Video Visit from 02/06/2022 in [facility]  MADRS Total Score [phone]      GAD-7    Flowsheet Row Office Visit from 08/22/2022 in [facility] [place]  Total GAD-7 Score 7      PHQ2-9    Flowsheet Row Office Visit from 08/22/2022 in [facility] [place] Office Visit from 08/19/2022 in [facility] Video Visit from 02/06/2022 in [facility] Office Visit from 11/16/2020 in [facility] for [facility] [place] Office Visit from 04/19/2018 in [facility] [place]  PHQ-2 Total Score 3 0 6 0 0  PHQ-9 Total Score 5 -- 14 -- 2        Past Psychiatric Medication Trials: Trintellix - Initially effective and then was not effective when re-started Paxil- Ineffective. Helped initially. Prozac-Insomnia Lexapro- Effective and then no longer as effective Zoloft- Initially effective and then not as effective.  Viibryd Cymbalta- Ineffective. Helped initially. Effexor XR- Effective, well tolerated. Pristiq- Increased anxiety at 150 mg daily Wellbutrin XL-Ineffective.May have increased anxiety. Amitriptyline Nortriptyline-Caused irritability, constipation Auvelity- ineffective  Abilify- Helpful but caused severe insomnia. Rexulti-Helpful but caused insomnia. Vraylar Seroquel - Ineffective Risperdal Olanzapine- Ineffective Latuda- Ineffective Caplyta  Klonopin- Effective Temazepam- Took during menopause Lunesta- Ineffective Ambien-Ineffective Sonata- Ineffective Dayvigo- partially effective  Failed resp Belsomra 15 and seroquel 25 Trazodone-  somewhat effective Remeron-Ineffective. Does not recall wt gain.   Adderall- Took once and had adverse effect. Concerta- Effective but only 4-6 hours Jornay 80 NR Metadate-Not as effective as Concerta Dexmethylphenidate- Not as effective as Concerta Azstarys- some side effects. Not as effective.  Ritalin short acting and SE anxiety & HTN @20  TID Cotemplay 17.3  Daytrana  Lamictal- May have had an adverse effects. "  Felt weird." Reports worsening s/s.  Lithium Gabapentin Pramipexole- "felt really weird."   ECT #20 with minimal  response. H says it was partly helpful.  AIMS    Flowsheet Row Video Visit from 01/03/2022 in [facility]  AIMS Total Score 0      ECT-MADRS    Flowsheet Row Clinical Support from 08/13/2022 in [facility] Video Visit from 04/29/2022 in [facility] Video Visit from 02/06/2022 in [facility]  MADRS Total Score 27 44 23      GAD-7    Flowsheet Row Office Visit from 08/22/2022 in [facility] [place]  Total GAD-7 Score 7      PHQ2-9    Flowsheet Row Office Visit from 08/22/2022 in [facility] [place] Office Visit from 08/19/2022 in [facility] Video Visit from 02/06/2022 in [facility] Office Visit from 11/16/2020 in [facility] [place] Office Visit from 04/19/2018 in [facility] Total Score 3 0 6 0 0  PHQ-9 Total Score 5 -- 14 -- 2        Review of Systems:  Review of Systems  Constitutional:  Positive for fatigue.  Cardiovascular:  Negative for palpitations.  Neurological:  Negative for tremors.  Psychiatric/Behavioral:  Positive for dysphoric mood and sleep disturbance. Negative for decreased concentration. The patient is nervous/anxious.     Medications: I have reviewed the patient's current medications.  Current Outpatient Medications  Medication Sig Dispense Refill   clonazePAM (KLONOPIN) 0.5 MG tablet 2 tablets at night and 1 tablet as needed daily for panic anxiety 75 tablet 1   Esketamine HCl, 84 MG Dose, (SPRAVATO, 84 MG DOSE,) 28 MG/DEVICE SOPK USE 3 SPRAYS IN EACH NOSTRIL EVERY 3 DAYS 3 each 1   Estradiol (VAGIFEM) 10 MCG TABS vaginal tablet Place 1 tablet (10 mcg total) vaginally 2 (two) times a week. 24 tablet 3   gabapentin (NEURONTIN) 800 MG  tablet Take 1 tablet (800 mg total) by mouth at bedtime. 90 tablet 1   Lemborexant (DAYVIGO) 10 MG TABS Take 1 tablet (10 mg total) by mouth at bedtime. 30 tablet 0   MAGNESIUM PO Take by mouth.     Methylphenidate (COTEMPLA XR-ODT) 17.3 MG TBED Take 1 tablet (17.3 mg total) by mouth every morning. 30 tablet 0   Multiple Vitamin (MULTIVITAMIN) tablet Take 1 tablet by mouth daily.     pramipexole (MIRAPEX) 0.5 MG tablet Take 1 tablet (0.5 mg total) by mouth 2 (two) times daily. 60 tablet 0   QUEtiapine (SEROQUEL) 25 MG tablet Take 1 tablet (25 mg total) by mouth at bedtime. (Patient not taking: Reported on 09/25/2022) 30 tablet 0   sertraline (ZOLOFT) 100 MG tablet Take 2 tablets (200 mg total) by mouth daily. 180 tablet 0   valACYclovir (VALTREX) 500 MG tablet Take one twice daily for 3 days with any symptoms 30 tablet 3   No current facility-administered medications for this visit.    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   TRANSAMINASES, SERUM, ELEVATED 08/14/2009    Past Medical History, Surgical history, Social history, and Family history were reviewed and updated as appropriate.   Please see review of systems for further details on the patient's review from today.   Objective:   Physical Exam:  LMP 12/27/2007 (Exact Date)   Physical Exam Constitutional:      General: She is not in acute distress. Musculoskeletal:        General: No deformity.  Neurological:     Mental Status: She is alert and oriented to person, place, and time.     Coordination: Coordination normal.  Psychiatric:         Attention and Perception: Attention and perception normal. She does not perceive auditory hallucinations.        Mood and Affect: Mood is anxious and depressed. Affect is not labile or blunt.        Speech: Speech normal. Speech is not slurred.        Behavior: Behavior normal.        Thought Content: Thought content normal. Thought content is not delusional. Thought content does not include homicidal or suicidal ideation. Thought content does not include suicidal plan.        Cognition and Memory: Cognition and memory normal.        Judgment: Judgment normal.     Comments: Insight intact Mood ok until the last few days Affect blunted less than in the past.     Lab Review:     Component Value Date/Time   NA 138 04/22/2022 0932   K 4.1 04/22/2022 0932   CL 98 04/22/2022 0932   CO2 31 04/22/2022 0932   GLUCOSE 102 (H) 04/22/2022 0932   BUN 10 04/22/2022 0932   CREATININE 0.71 04/22/2022 0932   CREATININE 0.68 01/26/2020 0920   CALCIUM 10.5 04/22/2022 0932   PROT 8.3 04/22/2022 0932   ALBUMIN 5.3 (H) 04/22/2022 0932   AST 82 (H) 04/22/2022 0932   ALT 118 (H) 04/22/2022 0932   ALKPHOS 74 04/22/2022 0932   BILITOT 0.3 04/22/2022 0932   GFRNONAA >90 05/01/2014 1645   GFRAA >90 05/01/2014 1645       Component Value Date/Time   WBC 3.3 (L) 04/22/2022 0932   RBC 4.25 04/22/2022 0932   HGB 14.3 04/22/2022 0932   HGB 14.1 10/26/2014 1034   HCT 40.7 04/22/2022 0932   PLT 245.0 04/22/2022 0932   MCV 95.7 04/22/2022 0932   MCH 34.4 (H) 01/26/2020 0920   MCHC 35.1 04/22/2022 0932   RDW 13.2 04/22/2022 0932   LYMPHSABS 0.9 04/22/2022 0932   MONOABS 0.2 04/22/2022 0932   EOSABS 0.1 04/22/2022 0932   BASOSABS 0.0 04/22/2022 0932    No results found for: "POCLITH", "LITHIUM"   No results found for: "PHENYTOIN", "PHENOBARB", "VALPROATE", "CBMZ"   .res Assessment: Plan:    Exer was seen today for follow-up, depression, anxiety, add and sleeping problem.  Diagnoses and all  orders for this visit:  Recurrent major depression resistant to treatment (HCC)  Insomnia, unspecified type  Generalized anxiety disorder  Attention deficit hyperactivity disorder (ADHD), predominantly inattentive type     Pt seen for TRD and administration of Spravato.  Is benefitting markedly.  Also retrial with paroxetine 0.5 mg twice daily has been markedly helpful at reducing the depression from a  10/10 level to 3/10 level in combination with the Spravato.  She has gotten additional benefit since the pramipexole was added. we discussed side effects of this medication including the unusual side effect potential of impulsivity or compulsivity or manic type symptoms.  Patient was administered Spravato 84 mg intranasally today.  The patient experienced the typical dissociation which gradually resolved over the 2-hour period of observation.  There were no complications.  Specifically the patient did not have nausea or vomiting. But mild headache.  Blood pressures remained within normal ranges at the 40-minute and 2-hour follow-up intervals.  By the time the 2-hour observation period was met the patient was alert and oriented and able to exit without assistance.  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  See nursing note for further details.Tolerated the Spravato to 84mg  . OK to reduce Spravato to weekly.  Watch for worsening.  Regarding med options.  She has obviously been thoroughly tried on numerous medications.  The only antidepressant category that she has not taken is MAO inhibitors which are not compatible with Spravato.  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 Disc this with her previously.    We discussed the short-term risks associated with benzodiazepines  including sedation and increased fall risk among others.  Discussed long-term side effect risk including dependence, potential withdrawal symptoms, and the potential eventual dose-related risk of dementia.  But recent studies from 2020 dispute this association between benzodiazepines and dementia risk. Newer studies in 2020 do not support an association with dementia.  Continue sertaline to 200 mg daily.  It has helped anxiety but not depression.    for insomnia, clonazepam 1 mg HS. trazodone doesn't work well but helps some Continue Dayvigo 10 HS.  Failed resp Belsomra 15 and seroquel 25  She wants to continue Cotempla or Concerta bc helps mood early in day and focus  benefit lasts longer.  Have not been able to get stimulant to be consitently effective throughout the day.  continue pramipexole trial off-label and reduce back to  TRD 0.5 mg BID She is tolerating this without problems so far.  Clearly better with it.   Crisis intervention around the events that have occurred in her family that may change her life from this point forward.  She is managing with expected emotional distress but not suicidal thought.  She does not desire any further medication changes except noted.  FU weekly Spravato .  Meredith Staggers, MD, DFAPA    Please see After Visit Summary for patient specific instructions.  Future Appointments  Date Time Provider Department Center  10/02/2022 10:00 AM Cottle, Steva Ready., MD [facility] None  10/02/2022 10:00 AM CP-NURSE [facility] None  10/13/2022 10:00 AM Cottle, Steva Ready., MD [facility] None  10/13/2022  3:00 PM CP-NURSE [facility] None  10/20/2022  2:00 PM Cottle, Steva Ready., MD [facility] None  10/20/2022  2:00 PM CP-NURSE [facility] None  09/18/2023  8:15 AM Jerene Bears, MD [facility]    No orders of the defined types were placed in this encounter.   ------------------------------- T

## 2022-09-29 ENCOUNTER — Ambulatory Visit (HOSPITAL_BASED_OUTPATIENT_CLINIC_OR_DEPARTMENT_OTHER): Payer: Managed Care, Other (non HMO)

## 2022-09-29 ENCOUNTER — Encounter: Payer: Self-pay | Admitting: Psychiatry

## 2022-09-29 ENCOUNTER — Ambulatory Visit (INDEPENDENT_AMBULATORY_CARE_PROVIDER_SITE_OTHER): Payer: 59 | Admitting: Psychiatry

## 2022-09-29 ENCOUNTER — Encounter (HOSPITAL_BASED_OUTPATIENT_CLINIC_OR_DEPARTMENT_OTHER): Payer: Self-pay

## 2022-09-29 ENCOUNTER — Ambulatory Visit: Payer: 59

## 2022-09-29 VITALS — BP 101/70 | HR 67 | Ht 60.0 in | Wt 88.6 lb

## 2022-09-29 VITALS — BP 127/84 | HR 68

## 2022-09-29 DIAGNOSIS — F339 Major depressive disorder, recurrent, unspecified: Secondary | ICD-10-CM | POA: Diagnosis not present

## 2022-09-29 DIAGNOSIS — R300 Dysuria: Secondary | ICD-10-CM | POA: Diagnosis not present

## 2022-09-29 DIAGNOSIS — G4700 Insomnia, unspecified: Secondary | ICD-10-CM | POA: Diagnosis not present

## 2022-09-29 DIAGNOSIS — Z6379 Other stressful life events affecting family and household: Secondary | ICD-10-CM

## 2022-09-29 DIAGNOSIS — F411 Generalized anxiety disorder: Secondary | ICD-10-CM

## 2022-09-29 DIAGNOSIS — F900 Attention-deficit hyperactivity disorder, predominantly inattentive type: Secondary | ICD-10-CM

## 2022-09-29 LAB — POCT URINALYSIS DIPSTICK
Bilirubin, UA: NEGATIVE
Blood, UA: NEGATIVE
Glucose, UA: NEGATIVE
Ketones, UA: NEGATIVE
Nitrite, UA: NEGATIVE
Protein, UA: NEGATIVE
Spec Grav, UA: 1.015 (ref 1.010–1.025)
Urobilinogen, UA: 0.2 E.U./dL
pH, UA: 7 (ref 5.0–8.0)

## 2022-09-29 MED ORDER — PHENAZOPYRIDINE HCL 200 MG PO TABS
200.0000 mg | ORAL_TABLET | Freq: Three times a day (TID) | ORAL | 0 refills | Status: DC | PRN
Start: 2022-09-29 — End: 2022-10-20

## 2022-09-29 MED ORDER — NITROFURANTOIN MONOHYD MACRO 100 MG PO CAPS
100.0000 mg | ORAL_CAPSULE | Freq: Two times a day (BID) | ORAL | 0 refills | Status: DC
Start: 2022-09-29 — End: 2022-10-20

## 2022-09-29 NOTE — Progress Notes (Addendum)
Heather Davila [id number] 05/12/1960 62 y.o.  Subjective:   Patient ID:  Heather Davila is a 62 y.o. (DOB 03/08/1961) female.  Chief Complaint:  Chief Complaint  Patient presents with   Follow-up   Depression   Anxiety   ADD    HPI Heather Davila presents to the office today for follow-up of treatment resistant recurrent depression, generalized anxiety disorder and insomnia.  Almost too numerous to count med failures. She is here for Spravato administration for her treatment resistant depression.  05/21/22 appt noted: Patient was administered first dose Spravato 56 mg intranasally today.  The patient experienced the typical dissociation which gradually resolved over the 2-hour period of observation.  There were no complications.  Specifically the patient did not have nausea or vomiting or headache.   Dissociation was mild. Did not experience any emotional relief from disabling depression.wants to increase Spravato to the usual dose.  She is aware insurance is not covering the costs at this time.  No SI acutely. Tolerating meds.    05/23/22 appt noted: Patient was administered Spravato 84 mg intranasally today.  The patient experienced the typical dissociation which gradually resolved over the 2-hour period of observation.  There were no complications.  Specifically the patient did not have nausea or vomiting.   Dissociation was greater than last dose and a little bothersome DT some HA. Tolerating meds.   Mood has not changed significantly thus far with Spravato.  05/27/22 appt noted: Patient was administered Spravato 84 mg intranasally today.  The patient experienced the typical dissociation which gradually resolved over the 2-hour period of observation.  There were no complications.  Specifically the patient did not have nausea or vomiting or headache. Today she has felt a little relief from the pressing heaviness of depression but a little more anxiety.  During administration of Spravato  she listens to [place] music and was able to relax a little more with the feeling of dissociation that was mildly bothersome. Husband was also in on the session today and asked some questions about IV ketamine versus nasal spray ketamine.  05/29/22 appt noted: Cancelled today DT hypertension  05/30/22 appt noted: Patient was administered Spravato 84 mg intranasally today.  The patient experienced the typical dissociation which gradually resolved over the 2-hour period of observation.  There were no complications.  Specifically the patient did not have nausea or vomiting or headache. She is seeing some improvement in mood with less continuous depression and less intensity.  Hopefulness is better.   No med complaints or SE  06/05/22 appt noted: Patient was administered Spravato 84 mg intranasally today.  The patient experienced the typical dissociation which gradually resolved over the 2-hour period of observation.  There were no complications.  Specifically the patient did not have nausea or vomiting or headache.  Specifically HA is less of a problem with Spravato. No SE with meds. Depression is improving with less intensity.  Intermittent anxiety easily.  More able to enjoy things.  No new concerns.  06/17/22 appt noted: Patient was administered Spravato 84 mg intranasally today.  The patient experienced the typical dissociation which gradually resolved over the 2-hour period of observation.  There were no complications.  Specifically the patient did not have nausea or vomiting or headache.  Specifically HA is less of a problem with Spravato. No SE with meds. Depression is improved 45% with less intensity.  Intermittent anxiety less easily.  More able to enjoy things.  No new concerns.  More active. Current meds: increased  clonazepam to about 1.5 mg HS DT recent insomnia, lexapro 20, Dayvigo 10 mg HS, concerta 36 mg AM, trazodone 100 mg HS.  06/20/22 appt noted: Current meds: increased clonazepam to  about 1.5 mg HS DT recent insomnia, lexapro 20, Dayvigo 10 mg HS, concerta 36 mg AM, trazodone 100 mg HS. Patient was administered Spravato 84 mg intranasally today.  The patient experienced the typical dissociation which gradually resolved over the 2-hour period of observation.  There were no complications.  Specifically the patient did not have nausea or vomiting or headache.  Specifically HA is less of a problem with Spravato. No SE with meds. Depression is improved 45% with less intensity.  Intermittent anxiety less easily.  More able to enjoy things.  No new concerns.  More active.  06/23/22 appt noted:  Current meds: increased clonazepam to about 1.5 mg HS DT recent insomnia, lexapro 20, Dayvigo 10 mg HS, concerta 36 mg AM, trazodone 100 mg HS. Patient was administered Spravato 84 mg intranasally today.  The patient experienced the typical dissociation which gradually resolved over the 2-hour period of observation.  There were no complications.  Specifically the patient did not have nausea or vomiting or headache.  Specifically HA is less of a problem with Spravato. No SE with meds. Intensity of Spravato dissociation varies.  Last week very intesne and this week more typical.  Depression is continuing to improve with better interest and activity and motivation.  Gradual improvement.  Wants to continue.  06/25/22 appt noted: Current meds: increased clonazepam to about 1.5 mg HS DT recent insomnia, lexapro 20, Dayvigo 10 mg HS, concerta 36 mg AM, trazodone 100 mg HS. Patient was administered Spravato 84 mg intranasally today.  The patient experienced the typical dissociation which gradually resolved over the 2-hour period of observation.  There were no complications.  Specifically the patient did not have nausea or vomiting or headache.  Specifically HA is less of a problem with Spravato. No SE with meds. Intensity of Spravato dissociation varies.  No scary and well tolerated. Continues to  gradually improve with Spravato.  She is more motivated and enjoying things more.  H sees progress.  Wants to continue twice weekly until gets maximum response.  Dep is not gone yet.  06/30/22 appt noted:  seen with H Current meds: increased clonazepam to about 1.5 mg HS DT recent insomnia, lexapro 20, Dayvigo 10 mg HS, concerta 36 mg AM, trazodone 100 mg HS. Patient was administered Spravato 84 mg intranasally today.  The patient experienced the typical dissociation which gradually resolved over the 2-hour period of observation.  There were no complications.  Specifically the patient did not have nausea or vomiting or headache.  Specifically HA is less of a problem with Spravato. No SE with meds. Intensity of Spravato dissociation varies.  No scary and well tolerated. Continues to gradually improve with Spravato.  She is more motivated and enjoying things more.  H sees even more progress than she does; more active and smiling.  Wants to continue twice weekly until gets maximum response.  Dep is 80% better.  07/07/22 appt noted: Current meds: increased clonazepam to about 1.5 mg HS DT recent insomnia, lexapro 20, Dayvigo 10 mg HS, concerta 36 mg AM, trazodone 100 mg HS. Patient was administered Spravato 84 mg intranasally today.  The patient experienced the typical dissociation which gradually resolved over the 2-hour period of observation.  There were no complications.  Specifically the patient did not have nausea or vomiting or  headache.  Specifically HA is less of a problem with Spravato. No SE with meds. Intensity of Spravato dissociation varies.  No scary and well tolerated.  Was more intese today. Overall mood is markedly better and please with meds. No changes desired.  07/09/22 appt noted: In transition from Lexapro to sertraline and missing Concerta bc CO crash from it after about 4 hours.  Disc these concerns.  She'll discuss also with Corie Chiquito, NP More dep the last couple of days and  concerned about reducing Spravato frequency while transition from one antidep to another.  Affect less positive. Patient was administered Spravato 84 mg intranasally today.  The patient experienced the typical dissociation which gradually resolved over the 2-hour period of observation.  There were no complications.  Specifically the patient did not have nausea or vomiting or headache.  Specifically HA is less of a problem with Spravato. No SE with meds. Intensity of Spravato dissociation varies.  No scary and well tolerated.   07/14/22 appt noted: Has been more depressed this week.  More trouble with sleep.  Taking clonazepam 1-1.5  of the 0.5 mg tablets.   Trazodone not helping much. She switched back from 200 sertraline to Lexapro 20.  No SE More trouble with motivation.  Some stress with son with special needs. Misses the benevit of Concerta but it was too short acting and crashed in 4-6 hours. Patient was administered Spravato 84 mg intranasally today.  The patient experienced the typical dissociation which gradually resolved over the 2-hour period of observation.  There were no complications.  Specifically the patient did not have nausea or vomiting or headache.  Specifically HA is less of a problem with Spravato. No SE with meds. Intensity of Spravato dissociation varies.  No scary and well tolerated.   07/17/22 appt noted: Patient was administered Spravato 84 mg intranasally today.  The patient experienced the typical dissociation which gradually resolved over the 2-hour period of observation.  There were no complications.  Specifically the patient did not have nausea or vomiting or headache.  Specifically HA is less of a problem with Spravato. No SE with meds. Intensity of Spravato dissociation varies.  Not scary and well tolerated.  Just got Jornay last night but wasn't sure how to take it.  Wants to start and this was discussed.  She felt MPH helped mood and attn but didn't last long enough  and had crash after a few hours on Concerta.  Better for 2 hours after each dose Ritalin 20 mg with mood but then it wore off.  BP up after 3rd dose 160/90 and then took H's amlodipine 5 and BP became normal.  07/22/22 appt noted: Patient was administered Spravato 84 mg intranasally today.  The patient experienced the typical dissociation which gradually resolved over the 2-hour period of observation.  There were no complications.  Specifically the patient did not have nausea or vomiting or headache.  Specifically HA is less of a problem with Spravato. No SE with meds. Intensity of Spravato dissociation varies.  Not scary and well tolerated.  Start Jornay 20 mg over the weekend and did not notice any particular effect good or bad.  Increased to 40 mg. Other psych meds: Clonazepam 0.5 mg tablets 2 nightly, gabapentin dosage varies, Dayvigo 10 mg nightly, Lexapro 20 mg daily, to trazodone 100 mg nightly  07/24/22 appt noted: Patient was administered Spravato 84 mg intranasally today.  The patient experienced the typical dissociation which gradually resolved over the 2-hour period  of observation.  There were no complications.  Specifically the patient did not have nausea or vomiting or headache.  Specifically HA is less of a problem with Spravato. No SE with meds. Intensity of Spravato dissociation varies.  Not scary and well tolerated.  Other psych meds: Clonazepam 0.5 mg tablets 2 nightly, gabapentin dosage varies, Dayvigo 10 mg nightly, Lexapro 20 mg daily, to trazodone 100 mg nightly, Jornay 40 mg PM Doing well with Spravato.  Noticed a little effect from the Jornay 40 without SE but would like to increase the dose for ADD and mood.  Sleeping well.  No new concerns.  Still more depressed than she was with th initial benefit of Spravato.  07/30/22 note:  Patient was administered Spravato 84 mg intranasally today.  The patient experienced the typical dissociation which gradually resolved over the 2-hour  period of observation.  There were no complications.  Specifically the patient did not have nausea or vomiting or headache.  Specifically HA is less of a problem with Spravato. Other psych meds: Clonazepam 0.5 mg tablets 2 nightly, gabapentin dosage varies, Dayvigo 10 mg nightly, Lexapro 20 mg daily, to trazodone 100 mg nightly, Jornay 60 mg PM Wants to increase Jornay to 80 mg PM to increase benefit for ADD and depression. No SE She has experienced a major family crisis that threatens to change her daily life from now on.  It has not triggered suicidal thoughts at this time but she is very afraid.  No desire to change medications.  08/04/22 appt noted" Patient was administered Spravato 84 mg intranasally today.  The patient experienced the typical dissociation which gradually resolved over the 2-hour period of observation.  There were no complications.  Specifically the patient did not have nausea or vomiting or headache.  Specifically HA is less of a problem with Spravato. Other psych meds: Clonazepam 0.5 mg tablets 2 nightly, gabapentin dosage varies, Dayvigo 10 mg nightly, Lexapro 20 mg daily, trazodone 100 mg nightly, Jornay 80 mg PM No SE No benefit or SE with increase Jormay 80 mg.  Says Concerta helped ADD and depression but not Jornay.  However duration was insufficient.  Still depressed and wants change to another form of stimulant.  No concerns with other meds. Continues with strong family stressors creating uncertainty about her future but no quick resolution. No SI Trial Cotempla 17.3 and DC Ritalin & Jornay 80  08/11/2022 appointment noted: Patient was administered Spravato 84 mg intranasally today.  The patient experienced the typical dissociation which gradually resolved over the 2-hour period of observation.  There were no complications.  Specifically the patient did not have nausea or vomiting or headache.  Specifically HA is less of a problem with Spravato now vs when started  it.. Other psych meds: Clonazepam 0.5 mg tablets 2 nightly, gabapentin dosage varies, Dayvigo 10 mg nightly, trazodone 100 mg nightly, cotempla 17.3, switch from Lexapro 20 to sertaline 15o mg . No noticeable effects sertraline from for depression but has seen some antianxiety effects from the sertraline.  No side effects noted.  No side effects with the other medicines.  Still is only getting very brief benefit 3 to 4 hours from Cotempla for ADD and mood.  She wants to try the Daytrana patch as we have discussed before. No SE  08/13/22 appt: Patient was administered Spravato 84 mg intranasally today.  The patient experienced the typical dissociation which gradually resolved over the 2-hour period of observation.  There were no complications.  Specifically the  patient did not have nausea or vomiting or headache.  Specifically HA is less of a problem with Spravato now vs when started it.. Other psych meds: Clonazepam 0.5 mg tablets 2 nightly, gabapentin dosage varies, Dayvigo 10 mg nightly, trazodone 100 mg nightly, cotempla 17.3, switch from Lexapro 20 to sertaline 15o mg . No noticeable effects sertraline from for depression but has seen some antianxiety effects from the sertraline.  No side effects noted.  No side effects with the other medicines.  Still is only getting very brief benefit 3 to 4 hours from Cotempla for ADD and mood.  She wants to try the Daytrana patch as we have discussed before.  Hasn't been able to get it yet. No SE Plan.  Continue to try to get Daytrana 30 AM  08/18/22 appt noted: Patient was administered Spravato 84 mg intranasally today.  The patient experienced the typical dissociation which gradually resolved over the 2-hour period of observation.  There were no complications.  Specifically the patient did not have nausea or vomiting or headache.  Specifically HA is less of a problem with Spravato now vs when started it.. Other psych meds: Clonazepam 0.5 mg tablets 2 nightly,  gabapentin dosage varies, Dayvigo 10 mg nightly, trazodone 100 mg nightly, cotempla 17.3, switch from Lexapro 20 to sertaline 15o mg . No noticeable effects sertraline from for depression but has seen some antianxiety effects from the sertraline.  No side effects noted.  No side effects with the other medicines.  Still is only getting very brief benefit 3 to 4 hours from Cotempla for ADD and mood.  She wants to try the Daytrana patch as we have discussed before.  Hasn't been able to get it yet.  Overall depression is some better than it was.   No SE Plan.  Continue to try to get Daytrana 30 AM  08/25/22 appt : No benefit Daytrana.  No SE.  Wants further med changes and interested In retrying pramipexole.   Tolerating meds. Patient was administered Spravato 84 mg intranasally today.  The patient experienced the typical dissociation which gradually resolved over the 2-hour period of observation.  There were no complications.  Specifically the patient did not have nausea or vomiting or headache.  Specifically HA is less of a problem with Spravato now vs when started it.. Other psych meds: Clonazepam 0.5 mg tablets 2 nightly, gabapentin dosage varies, Dayvigo 10 mg nightly, trazodone 100 mg nightly, , switch from Lexapro 20 to sertaline 15o mg . Daytrana 30 AM for a few days. Struggles with depression and no SI.  Reduced interest and mood and motivation and enjoyment and socialization.  08/27/22 appt noted: Psych meds:  Clonazepam 0.5 mg tablets 2 nightly, gabapentin dosage varies, Dayvigo 10 mg nightly, trazodone 100 mg nightly,  sertaline 15o mg . Returned to [employer] AM, pramipexole  Tolerating meds. Patient was administered Spravato 84 mg intranasally today.  The patient experienced the typical dissociation which gradually resolved over the 2-hour period of observation.  There were no complications.  Specifically the patient did not have nausea or vomiting or headache.   Reports taking cotempla bc brief  mood benefit and sig focus and productivity benefit. Took 1 dose pramipexole and felt more dep.  09/01/22 appt noted: Psych meds:  Clonazepam 0.5 mg tablets 2 nightly, gabapentin dosage varies, Dayvigo 10 mg nightly, trazodone 100 mg nightly,  sertaline 15o mg . Returned to Cotempla AM, pramipexole  0.25 mg BID. Tolerating meds now. Patient was administered Spravato 84  mg intranasally today.  The patient experienced the typical dissociation which gradually resolved over the 2-hour period of observation.  There were no complications.  Specifically the patient did not have nausea or vomiting or headache.   Willing to try the pramipexole and will continue 0.25 mg BID this week and then consider increase if tolerated.  Mood is a little better.  Still looking for further improvement.  09/03/22 appt noted: Psych meds:  Clonazepam 0.5 mg tablets 2 nightly, gabapentin dosage varies, Dayvigo 10 mg nightly, trazodone 100 mg nightly,  sertaline 15o mg . Returned to Cotempla AM, pramipexole  0.25 mg tablet 1 and 1/2 tablets twice daily BID. Tolerating meds now. Patient was administered Spravato 84 mg intranasally today.  The patient experienced the typical dissociation which gradually resolved over the 2-hour period of observation.  There were no complications.  Specifically the patient did not have nausea or vomiting or headache.   Depression may be a little better with the parmipexole and is tolerating it well so far.  Still struggling with focus and residual depression.  Tolerating meds without SE.  More hopeful and able to enjoy some things.  09/08/22 appt  Psych meds:  Clonazepam 0.5 mg tablets 2 nightly, gabapentin dosage varies, Dayvigo 10 mg nightly, trazodone 100 mg nightly,  sertaline 15o mg . Returned to Cotempla AM, pramipexole  0.5 mg BID. Tolerating meds now. Patient was administered Spravato 84 mg intranasally today.  The patient experienced the typical dissociation which gradually resolved over the  2-hour period of observation.  There were no complications.  Specifically the patient did not have nausea or vomiting or headache.   She has seen her depression drop from a 10/10 prior to treatment with Spravato to now 4/10 With additional benefit noted when she increased the pramipexole to 0.5 mg twice daily.  She is not having side effects with this like she did in the past. Anxiety levels are also improved. She is sleeping okay with the current medications. Plan: continue pramipexole trial off-label and increase more slowly for TRD.  Increase  Gradually  to 0.75 mg BID  09/11/22 appt noted: Psych meds:  Clonazepam 0.5 mg tablets 2 nightly, gabapentin dosage varies, Dayvigo 10 mg nightly, trazodone 100 mg nightly,  sertaline 15o mg . Returned to Cotempla AM, pramipexole  0.75 mg BID. Tolerating meds now. Patient was administered Spravato 84 mg intranasally today.  The patient experienced the typical dissociation which gradually resolved over the 2-hour period of observation.  There were no complications.  Specifically the patient did not have nausea or vomiting or headache.   She has seen her depression drop from a 10/10 prior to treatment with Spravato to now 4/10 With additional benefit noted when she increased the pramipexole to 0.5 mg twice daily.  She is not having side effects with this like she did in the past. Clearly improving with the pramipexole now and tolerating it.  Satisfied with current meds but would consider increasing if it might be helpful.  09/15/22 appt noted: Psych meds:  Clonazepam 0.5 mg tablets 2 nightly, gabapentin dosage varies, Dayvigo 10 mg nightly, trazodone 100 mg nightly,  sertaline 15o mg . Returned to Cotempla AM, pramipexole  0.75 mg BID too anxious and reduced to 0.5 mg BID. Tolerating meds now. Patient was administered Spravato 84 mg intranasally today.  The patient experienced the typical dissociation which gradually resolved over the 2-hour period of  observation.  There were no complications.  Specifically the patient did not  have nausea or vomiting or headache.   Trouble staying asleep longer than 6 hours.  Doesn't feel she can tolerate pramipexole 0.75 mg BID DT anxiety not experienced at  0.5 mg BID.  Otherwise tolerating meds.  Mood is better with Spravato and pramipexole.   Not sleeping as long with meds.  EMA 6 hours.  Wonders about change Disc concerns about waiting for therapy. Feels ready to reduce Spravato to weekly.    09/25/22 appt noted: Psych meds:  Clonazepam 0.5 mg tablets 2 nightly, gabapentin dosage varies, Dayvigo 10 mg nightly, trazodone 100 mg nightly,  sertaline 15o mg . Returned to Cotempla AM, pramipexole  0.75 mg BID too anxious and reduced to 0.5 mg BID. Tolerating meds now. Patient was administered Spravato 84 mg intranasally today.  The patient experienced the typical dissociation which gradually resolved over the 2-hour period of observation.  There were no complications.  Specifically the patient did not have nausea or vomiting or headache.   Trouble staying asleep longer than 6 hours.  Failed to respond to Seroquel 25 mg HS.  Gone back to [facility]  Belsomra NR. Mood ok until the last few days more dep bc it's been 10 days since last admin.  Wants to continue weekly.   No SE.  No med changes desired.  09/29/22 appt noted: Psych meds:  Clonazepam 0.5 mg tablets 2 nightly, gabapentin dosage varies, Dayvigo 10 mg nightly, trazodone 100 mg nightly,  sertaline 15o mg . Returned to Cotempla AM, pramipexole  0.75 mg BID too anxious and reduced to 0.5 mg BID. Tolerating meds now. Patient was administered Spravato 84 mg intranasally today.  The patient experienced the typical dissociation which gradually resolved over the 2-hour period of observation.  There were no complications.  Specifically the patient did not have nausea or vomiting or headache.   Trouble staying asleep longer than 6 hours.  Plan: Continue sertaline to  200 mg daily.  It has helped anxiety but not depression.   for insomnia, clonazepam 1 mg HS. trazodone doesn't work well but helps some Continue Dayvigo 10 HS.  Failed resp Belsomra 15 and seroquel 25 She wants to continue Cotempla or Concerta bc helps mood early in day and focus  benefit lasts longer.  Have not been able to get stimulant to be consitently effective throughout the day. continue pramipexole trial off-label and reduce back to  TRD 0.5 mg BID Spravato weekly.  AIMS    Flowsheet Row Video Visit from 01/03/2022 in [facility]  AIMS Total Score 0      ECT-MADRS    Flowsheet Row Clinical Support from 08/13/2022 in [facility] [facility] Video Visit from 04/29/2022 in [facility] Video Visit from 02/06/2022 in [facility]  MADRS Total Score 27 44 23      GAD-7    Flowsheet Row Office Visit from 08/22/2022 in [facility] at [place]  Total GAD-7 Score 7      PHQ2-9    Flowsheet Row Office Visit from 08/22/2022 in [facility] [place] Office Visit from 08/19/2022 in [facility] at [facility] Video Visit from 02/06/2022 in [facility] Office Visit from 11/16/2020 in [facility] for [facility] at [place] Office Visit from 04/19/2018 in [facility] Total Score 3 0 6 0 0  PHQ-9 Total Score 5 -- 14 --  2        Past Psychiatric Medication Trials: Trintellix - Initially effective and then was not effective when re-started Paxil- Ineffective. Helped initially. Prozac-Insomnia Lexapro- Effective and then no longer as effective Zoloft- Initially effective and then not as effective.  Viibryd Cymbalta- Ineffective. Helped initially. Effexor XR- Effective, well tolerated. Pristiq- Increased anxiety at 150 mg  daily Wellbutrin XL-Ineffective.May have increased anxiety. Amitriptyline Nortriptyline-Caused irritability, constipation Auvelity- ineffective  Abilify- Helpful but caused severe insomnia. Rexulti-Helpful but caused insomnia. Vraylar Seroquel - Ineffective Risperdal Olanzapine- Ineffective Latuda- Ineffective Caplyta  Klonopin- Effective Temazepam- Took during menopause Lunesta- Ineffective Ambien-Ineffective Sonata- Ineffective Dayvigo- partially effective  Failed resp Belsomra 15 and seroquel 25 Trazodone-  somewhat effective Remeron-Ineffective. Does not recall wt gain.  Quetiapine 25 NR  Adderall- Took once and had adverse effect. Concerta- Effective but only 4-6 hours Jornay 80 NR Metadate-Not as effective as Concerta Dexmethylphenidate- Not as effective as Concerta Azstarys- some side effects. Not as effective.  Ritalin short acting and SE anxiety & HTN @20  TID Cotemplay 17.3  Daytrana  Lamictal- May have had an adverse effects. "Felt weird." Reports worsening s/s.  Lithium Gabapentin Pramipexole- "felt really weird."   ECT #20 with minimal response. H says it was partly helpful.  AIMS    Flowsheet Row Video Visit from 01/03/2022 in [facility]  AIMS Total Score 0      ECT-MADRS    Flowsheet Row Clinical Support from 08/13/2022 in [facility] Video Visit from 04/29/2022 in [facility] Video Visit from 02/06/2022 in [facility]  MADRS Total Score 27 44 23      GAD-7    Flowsheet Row Office Visit from 08/22/2022 in [facility] [place]  Total GAD-7 Score 7      PHQ2-9    Flowsheet Row Office Visit from 08/22/2022 in [facility] [place] Office Visit from 08/19/2022 in [facility] for [facility] at [facility] Video Visit from 02/06/2022 [facility] Visit from 11/16/2020 in [facility] at [place] Office Visit from 04/19/2018 in [facility] at [facility] Total Score 3 0 6 0 0  PHQ-9 Total Score 5 -- 14 -- 2        Review of Systems:  Review of Systems  Cardiovascular:  Negative for palpitations.  Neurological:  Negative for tremors.  Psychiatric/Behavioral:  Positive for dysphoric mood and sleep disturbance. Negative for decreased concentration. The patient is nervous/anxious.     Medications: I have reviewed the patient's current medications.  Current Outpatient Medications  Medication Sig Dispense Refill   clonazePAM (KLONOPIN) 0.5 MG tablet 2 tablets at night and 1 tablet as needed daily for panic anxiety 75 tablet 1   Estradiol (VAGIFEM) 10 MCG TABS vaginal tablet Place 1 tablet (10 mcg total) vaginally 2 (two) times a week. 24 tablet 3   gabapentin (NEURONTIN) 800 MG tablet Take 1 tablet (800 mg total) by mouth at bedtime. 90 tablet 1   Lemborexant (DAYVIGO) 10 MG TABS Take 1 tablet (10 mg total) by mouth at bedtime. 30 tablet 0   MAGNESIUM PO Take by mouth.     Methylphenidate (COTEMPLA XR-ODT) 17.3 MG TBED Take 1 tablet (17.3 mg total) by mouth every morning. 30 tablet 0   Multiple Vitamin (MULTIVITAMIN) tablet Take 1 tablet by mouth daily.  nitrofurantoin, macrocrystal-monohydrate, (MACROBID) 100 MG capsule Take 1 capsule (100 mg total) by mouth 2 (two) times daily. 10 capsule 0   phenazopyridine (PYRIDIUM) 200 MG tablet Take 1 tablet (200 mg total) by mouth 3 (three) times daily as needed for pain. 10 tablet 0   QUEtiapine (SEROQUEL) 25 MG tablet Take 1 tablet (25 mg total) by mouth at bedtime. (Patient not taking: Reported on 09/25/2022) 30 tablet 0   valACYclovir (VALTREX) 500 MG tablet Take one twice daily for 3 days with any symptoms 30 tablet 3   Esketamine HCl, 84 MG Dose, (SPRAVATO, 84 MG DOSE,) 28 MG/DEVICE SOPK USE 3 SPRAYS  IN EACH NOSTRIL EVERY 3 DAYS 3 each 1   pramipexole (MIRAPEX) 0.5 MG tablet Take 1 tablet (0.5 mg total) by mouth 3 (three) times daily. 90 tablet 1   sertraline (ZOLOFT) 100 MG tablet Take 2 tablets (200 mg total) by mouth daily. 180 tablet 0   No current facility-administered medications for this visit.    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   TRANSAMINASES, SERUM, ELEVATED 08/14/2009    Past Medical History, Surgical history, Social history, and Family history were reviewed and updated as appropriate.   Please see review of systems for further details on the patient's review from today.   Objective:   Physical Exam:  LMP 12/27/2007 (Exact Date)   Physical Exam Constitutional:      General: She is not in acute distress. Musculoskeletal:        General: No deformity.  Neurological:     Mental Status: She is alert and oriented to person, place, and time.     Coordination: Coordination normal.  Psychiatric:        Attention and Perception: Attention and perception normal. She does not perceive auditory hallucinations.        Mood and Affect: Mood is anxious and depressed. Affect is not labile or blunt.        Speech: Speech normal. Speech is not slurred.        Behavior: Behavior normal.        Thought Content: Thought content normal. Thought content is not delusional. Thought content does not include homicidal or suicidal ideation. Thought content does not include suicidal plan.        Cognition and Memory: Cognition and memory normal.        Judgment: Judgment normal.     Comments: Insight  intact Mood ok generally. Affect blunted less than in the past.     Lab Review:     Component Value Date/Time   NA 138 04/22/2022 0932   K 4.1 04/22/2022 0932   CL 98 04/22/2022 0932   CO2 31 04/22/2022 0932   GLUCOSE 102 (H) 04/22/2022 0932   BUN 10 04/22/2022 0932   CREATININE 0.71 04/22/2022 0932   CREATININE 0.68 01/26/2020 0920   CALCIUM 10.5 04/22/2022 0932   PROT 8.3 04/22/2022 0932   ALBUMIN 5.3 (H) 04/22/2022 0932   AST 82 (H) 04/22/2022 0932   ALT  118 (H) 04/22/2022 0932   ALKPHOS 74 04/22/2022 0932   BILITOT 0.3 04/22/2022 0932   GFRNONAA >90 05/01/2014 1645   GFRAA >90 05/01/2014 1645       Component Value Date/Time   WBC 3.3 (L) 04/22/2022 0932   RBC 4.25 04/22/2022 0932   HGB 14.3 04/22/2022 0932   HGB 14.1 10/26/2014 1034   HCT 40.7 04/22/2022 0932   PLT 245.0 04/22/2022 0932   MCV 95.7 04/22/2022 0932   MCH 34.4 (H) 01/26/2020 0920   MCHC 35.1 04/22/2022 0932   RDW 13.2 04/22/2022 0932   LYMPHSABS 0.9 04/22/2022 0932   MONOABS 0.2 04/22/2022 0932   EOSABS 0.1 04/22/2022 0932   BASOSABS 0.0 04/22/2022 0932    No results found for: "POCLITH", "LITHIUM"   No results found for: "PHENYTOIN", "PHENOBARB", "VALPROATE", "CBMZ"   .res Assessment: Plan:    Heather Davila was seen today for follow-up, depression, anxiety and add.  Diagnoses and all orders for this visit:  Recurrent major depression resistant to treatment (HCC)  Generalized anxiety disorder  Insomnia, unspecified type  Attention deficit hyperactivity disorder (ADHD), predominantly inattentive type  Stressful life event affecting family     Pt seen for TRD and administration of Spravato.  Is benefitting markedly.  Also retrial with paroxetine 0.5 mg twice daily has been markedly helpful at reducing the depression from a 10/10 level to 3/10 level in combination with the Spravato.  She has gotten additional benefit since the pramipexole was added. we discussed side effects of this  medication including the unusual side effect potential of impulsivity or compulsivity or manic type symptoms.  Patient was administered Spravato 84 mg intranasally today.  The patient experienced the typical dissociation which gradually resolved over the 2-hour period of observation.  There were no complications.  Specifically the patient did not have nausea or vomiting. But mild headache.  Blood pressures remained within normal ranges at the 40-minute and 2-hour follow-up intervals.  By the time the 2-hour observation period was met the patient was alert and oriented and able to exit without assistance.  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  See nursing note for further details.Tolerated the Spravato to 84mg  . OK to reduce Spravato to weekly.  Watch for worsening.  Regarding med options.  She has obviously been thoroughly tried on numerous medications.  The only antidepressant category that she has not taken is MAO inhibitors which are not compatible with Spravato.  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 Disc this with her previously.    We discussed the short-term risks associated with benzodiazepines including sedation and increased fall risk among others.  Discussed long-term side effect risk including dependence, potential withdrawal symptoms, and the potential eventual dose-related risk of dementia.  But recent studies from 2020 dispute this association between benzodiazepines and dementia risk. Newer studies in 2020 do not support an association with dementia.  Continue sertraline to 200 mg daily.  It has helped anxiety but not depression.    for insomnia, clonazepam 1 mg HS. trazodone doesn't work well but helps some Continue Dayvigo 10 HS.  Failed resp  Belsomra 15 and seroquel 25  She wants to continue Cotempla or Concerta bc helps mood early in day and focus  benefit lasts longer.  Have not been able to get stimulant to  be consitently effective throughout the day.  continue pramipexole trial off-label and reduce back to  TRD 0.5 mg BID She is tolerating this without problems so far.  Clearly better with it.   No med changes otherwise.  Crisis intervention around the events that have occurred in her family that may change her life from this point forward.  She is managing with expected emotional distress but not suicidal thought.  She does not desire any further medication changes except noted.  FU weekly Spravato .  Meredith Staggers, MD, DFAPA    Please see After Visit Summary for patient specific instructions.  Future Appointments  Date Time Provider Department Center  10/13/2022  2:00 PM Cottle, Steva Ready., MD CP-CP None  10/13/2022  2:00 PM CP-NURSE CP-CP None  10/20/2022  2:00 PM Cottle, Steva Ready., MD CP-CP None  10/20/2022  2:00 PM CP-NURSE CP-CP None  09/18/2023  8:15 AM Jerene Bears, MD [facility]    No orders of the defined types were placed in this encounter.   ------------------------------- [id number]

## 2022-09-29 NOTE — Progress Notes (Signed)
NURSES NOTE:     Patient arrived for her #36 weekly Spravato treatment. Pt is being treated for Treatment Resistant Depression, the starting dose is 56 mg (2 of the 28 mg) nasal sprays. Pt will be receiving 84 mg (3 of the 28 mg) nasal sprays, this is her maintenance dose. Patient taken to treatment room, I explained and discussed her treatment and how the schedule should go, along with any side effects that may occur. Answered any questions and concerns the patient had. Pt's Spravato is delivered through [place] and she is now billing with her insurance. Spravato medication is stored at doctors office per REMS/FDA guidelines. The medication is required to be locked behind two doors per FDA/REMS Protocol. Medication is also disposed of properly per regulations. All treatments and her vital signs are documented in Spravato REMS per protocol of treatment center regulations.      Pt contacted nurse this morning asking if she could come in this morning due to worsening depression symptoms. Pt is already scheduled for Thursday, June 6th but would like an extra day this week. She had went down to once weekly but feels like she is needing more again. Dr. Jennelle Human is aware and will discuss with pt at her visit. Vital Signs assessed at 9:10 AM 100/65, pulse 67, pulse ox 92%. Instructed pt to blow her nose and recline back slightly to prevent any of medication dripping out of her nose.  Pt. Given 1st dose (28 mg inhaler), then 5 minutes between 2nd dose and 3rd dose. No complaints of nausea/vomiting reported. Pt did listen to music today, she just reclined back and closed her eyes. After 40 minutes I went to assess her vital signs, no side effects or symptoms reported, B/P 125/74, pulse 68. Informed pt could rest in the chair Dr. Jennelle Human came in to discuss her treatment at the end. Nurse was with pt a total of 60 minutes but pt observed for 120 minutes per protocol. Discharge vital signs were at 11:05 AM, B/P 127/84, pulse  68. She reports dissociation but she was clear upon her discharge. She scheduled her next apt  June 6th.  She is instructed to contact office if needing anything prior to her next treatment.     LOT # [id number] EXP JAN 2027

## 2022-09-29 NOTE — Progress Notes (Signed)
Patient came in today with complaints of pain while urinating. Patient gave a urine sample to be evaluated and sent off to the lab. tbw

## 2022-10-01 ENCOUNTER — Other Ambulatory Visit: Payer: Self-pay | Admitting: Psychiatry

## 2022-10-01 DIAGNOSIS — F339 Major depressive disorder, recurrent, unspecified: Secondary | ICD-10-CM

## 2022-10-02 ENCOUNTER — Encounter: Payer: 59 | Admitting: Psychiatry

## 2022-10-02 ENCOUNTER — Other Ambulatory Visit: Payer: Self-pay

## 2022-10-02 DIAGNOSIS — F339 Major depressive disorder, recurrent, unspecified: Secondary | ICD-10-CM

## 2022-10-02 DIAGNOSIS — F411 Generalized anxiety disorder: Secondary | ICD-10-CM

## 2022-10-02 LAB — URINE CULTURE

## 2022-10-02 MED ORDER — SERTRALINE HCL 100 MG PO TABS
200.0000 mg | ORAL_TABLET | Freq: Every day | ORAL | 0 refills | Status: DC
Start: 2022-10-02 — End: 2022-11-18

## 2022-10-02 MED ORDER — PRAMIPEXOLE DIHYDROCHLORIDE 0.5 MG PO TABS
0.5000 mg | ORAL_TABLET | Freq: Three times a day (TID) | ORAL | 1 refills | Status: DC
Start: 2022-10-02 — End: 2022-10-13

## 2022-10-06 ENCOUNTER — Encounter: Payer: Self-pay | Admitting: Adult Health

## 2022-10-06 ENCOUNTER — Ambulatory Visit: Payer: 59

## 2022-10-06 ENCOUNTER — Ambulatory Visit (INDEPENDENT_AMBULATORY_CARE_PROVIDER_SITE_OTHER): Payer: 59 | Admitting: Adult Health

## 2022-10-06 VITALS — BP 112/70 | HR 65

## 2022-10-06 DIAGNOSIS — F339 Major depressive disorder, recurrent, unspecified: Secondary | ICD-10-CM

## 2022-10-06 NOTE — Progress Notes (Signed)
Heather Davila [id number] 04/21/1961 62 y.o.  Subjective:   Patient ID:  Heather Davila is a 62 y.o. (DOB 1960/05/01) female.  Chief Complaint: No chief complaint on file.   HPI Heather Davila presents to the office today for follow-up of follow-up of treatment resistant depression (TRD).   Spravato treatment    Patient was administered Spravato 84 mg intranasally today. Patient was observed by provider throughout [facility] treatment. The patient experienced the typical dissociation which gradually resolved over the 2-hour period of observation. There were no complications. Specifically, the patient did not have any untoward side effects - feeling disconnected from themself, their thoughts, feelings and things around them, dizziness, nausea, feeling sleepy, decreased feeling of sensitivity (numbness) spinning sensation, feeling anxious, lack of energy, increased blood pressure, feeling happy or very excited, or headache. Blood pressures remained within normal ranges at the 40-minute and 2-hour follow-up intervals. By the time the 2-hour observation period was met the patient was alert and oriented and able to exit without assistance. Patient willing to continue Spravato administration for the treatment of resistant depression. See nursing note for further details.     AIMS    Flowsheet Row Video Visit from 01/03/2022 in [facility]  AIMS Total Score 0      ECT-MADRS    Flowsheet Row Clinical Support from 08/13/2022 in [facility] Video Visit from 04/29/2022 in [facility] Video Visit from 02/06/2022 in [facility]  MADRS Total Score 27 44 23      GAD-7    Flowsheet Row Office Visit from 08/22/2022 in [facility] [place]  Total GAD-7 Score 7      PHQ2-9    Flowsheet Row Office Visit from 08/22/2022 in [facility] [place]  Office Visit from 08/19/2022 in [facility] Healthcare at [facility] Video Visit from 02/06/2022 in [facility] Office Visit from 11/16/2020 in [facility] [place] Office Visit from 04/19/2018 in [facility] Total Score 3 0 6 0 0  PHQ-9 Total Score 5 -- 14 -- 2        Review of Systems:  Review of Systems  Musculoskeletal:  Negative for gait problem.  Neurological:  Negative for tremors.  Psychiatric/Behavioral:         Please refer to HPI    Medications: I have reviewed the patient's current medications.  Current Outpatient Medications  Medication Sig Dispense Refill   QUEtiapine (SEROQUEL) 25 MG tablet Take 1 tablet (25 mg total) by mouth at bedtime. (Patient not taking: Reported on 09/25/2022) 30 tablet 0   clonazePAM (KLONOPIN) 0.5 MG tablet 2 tablets at night and 1 tablet as needed daily for panic anxiety 75 tablet 1   Esketamine HCl, 84 MG Dose, (SPRAVATO, 84 MG DOSE,) 28 MG/DEVICE SOPK USE 3 SPRAYS IN EACH NOSTRIL EVERY 3 DAYS 3 each 1   Estradiol (VAGIFEM) 10 MCG TABS vaginal tablet Place 1 tablet (10 mcg total) vaginally 2 (two) times a week. 24 tablet 3   gabapentin (NEURONTIN) 800 MG tablet Take 1 tablet (800 mg total) by mouth at bedtime. 90 tablet 1   Lemborexant (DAYVIGO) 10 MG TABS Take 1 tablet (10 mg total) by mouth at bedtime. 30 tablet 0   MAGNESIUM PO Take by mouth.     Methylphenidate (COTEMPLA XR-ODT) 17.3 MG TBED Take 1  tablet (17.3 mg total) by mouth every morning. 30 tablet 0   Multiple Vitamin (MULTIVITAMIN) tablet Take 1 tablet by mouth daily.     nitrofurantoin, macrocrystal-monohydrate, (MACROBID) 100 MG capsule Take 1 capsule (100 mg total) by mouth 2 (two) times daily. 10 capsule 0   phenazopyridine (PYRIDIUM) 200 MG tablet Take 1 tablet (200 mg total) by mouth 3 (three) times daily as needed for pain. 10 tablet 0    pramipexole (MIRAPEX) 0.5 MG tablet Take 1 tablet (0.5 mg total) by mouth 3 (three) times daily. 90 tablet 1   sertraline (ZOLOFT) 100 MG tablet Take 2 tablets (200 mg total) by mouth daily. 180 tablet 0   valACYclovir (VALTREX) 500 MG tablet Take one twice daily for 3 days with any symptoms 30 tablet 3   No current facility-administered medications for this visit.    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   TRANSAMINASES, SERUM, ELEVATED 08/14/2009    Past Medical History, Surgical history, Social history, and Family history were reviewed and updated as appropriate.   Please see review of systems for further details on the patient's review from today.   Objective:   Physical Exam:  LMP 12/27/2007 (Exact Date)   Physical Exam Constitutional:      General: She is not in acute distress. Musculoskeletal:        General: No deformity.  Neurological:     Mental Status: She is alert and oriented to person, place, and time.     Coordination: Coordination normal.  Psychiatric:        Attention and Perception: Attention and perception normal. She does not perceive auditory or visual hallucinations.        Mood and Affect: Mood normal. Mood is not anxious or depressed. Affect is not labile, blunt, angry or inappropriate.        Speech: Speech normal.        Behavior: Behavior normal.        Thought Content: Thought content normal. Thought content is not paranoid or delusional. Thought content does not include homicidal or suicidal ideation. Thought content does not include homicidal or suicidal plan.         Cognition and Memory: Cognition and memory normal.        Judgment: Judgment normal.     Comments: Insight intact     Lab Review:     Component Value Date/Time   NA 138 04/22/2022 0932   K 4.1 04/22/2022 0932   CL 98 04/22/2022 0932   CO2 31 04/22/2022 0932   GLUCOSE 102 (H) 04/22/2022 0932   BUN 10 04/22/2022 0932   CREATININE 0.71 04/22/2022 0932   CREATININE 0.68 01/26/2020 0920   CALCIUM 10.5 04/22/2022 0932   PROT 8.3 04/22/2022 0932   ALBUMIN 5.3 (H) 04/22/2022 0932   AST 82 (H) 04/22/2022 0932   ALT 118 (H) 04/22/2022 0932   ALKPHOS 74 04/22/2022 0932   BILITOT 0.3 04/22/2022 0932   GFRNONAA >90 05/01/2014 1645   GFRAA >90 05/01/2014 1645  Component Value Date/Time   WBC 3.3 (L) 04/22/2022 0932   RBC 4.25 04/22/2022 0932   HGB 14.3 04/22/2022 0932   HGB 14.1 10/26/2014 1034   HCT 40.7 04/22/2022 0932   PLT 245.0 04/22/2022 0932   MCV 95.7 04/22/2022 0932   MCH 34.4 (H) 01/26/2020 0920   MCHC 35.1 04/22/2022 0932   RDW 13.2 04/22/2022 0932   LYMPHSABS 0.9 04/22/2022 0932   MONOABS 0.2 04/22/2022 0932   EOSABS 0.1 04/22/2022 0932   BASOSABS 0.0 04/22/2022 0932    No results found for: "POCLITH", "LITHIUM"   No results found for: "PHENYTOIN", "PHENOBARB", "VALPROATE", "CBMZ"   .res Assessment: Plan:    Recommendations/Plan   Continue Spravato treatment.   Patient advised to contact office with any questions, adverse effects, or acute worsening in signs and symptoms.  Diagnoses and all orders for this visit:  Recurrent major depression resistant to treatment [facility])     Please see After Visit Summary for patient specific instructions.  Future Appointments  Date Time Provider Department Center  10/13/2022 10:00 AM Cottle, Steva Ready., MD CP-CP None  10/13/2022  3:00 PM CP-NURSE CP-CP None  10/20/2022  2:00 PM Cottle, Steva Ready., MD CP-CP None  10/20/2022  2:00 PM CP-NURSE CP-CP None  09/18/2023  8:15 AM Jerene Bears, MD [facility]     No orders of the defined types were placed in this encounter.   -------------------------------

## 2022-10-06 NOTE — Progress Notes (Signed)
NURSES NOTE:     Patient arrived for her #37 weekly Spravato treatment. Pt is being treated for Treatment Resistant Depression, the starting dose is 56 mg (2 of the 28 mg) nasal sprays. Pt will be receiving 84 mg (3 of the 28 mg) nasal sprays, this is her maintenance dose. Patient taken to treatment room, I explained and discussed her treatment and how the schedule should go, along with any side effects that may occur. Answered any questions and concerns the patient had. Pt's Spravato is delivered through [place] and she is now billing with her insurance. Spravato medication is stored at doctors office per REMS/FDA guidelines. The medication is required to be locked behind two doors per FDA/REMS Protocol. Medication is also disposed of properly per regulations. All treatments and her vital signs are documented in Spravato REMS per protocol of treatment center regulations.      Pt reached out last week and decided she did not need another treatment last week. She also reports doing good all week so she will just see how she is doing but will continue once a week treatments. Vital Signs assessed at 8:10 AM 106/62, pulse 61, pulse ox 92%. Instructed pt to blow her nose and recline back slightly to prevent any of medication dripping out of her nose.  Pt. Given 1st dose (28 mg inhaler), then 5 minutes between 2nd dose and 3rd dose. No complaints of nausea/vomiting reported. Pt did listen to music today, she just reclined back and closed her eyes. After 40 minutes I went to assess her vital signs, no side effects or symptoms reported, B/P 109/66, pulse 68. Informed pt could rest in the chair then [facility], DNP would touch base with her today while Dr. Jennelle Human is out of office. She came in to discuss her treatment at the end. Nurse was with pt a total of 60 minutes but pt observed for 120 minutes per protocol. Discharge vital signs were at 10:00 AM, B/P 112/70, pulse 65. She reports dissociation but she was clear  upon her discharge. She scheduled her next apt June 17 th.  She is instructed to contact office if needing anything prior to her next treatment.     LOT # [id number] EXP FEB 2027

## 2022-10-13 ENCOUNTER — Encounter: Payer: 59 | Admitting: Psychiatry

## 2022-10-13 ENCOUNTER — Ambulatory Visit (INDEPENDENT_AMBULATORY_CARE_PROVIDER_SITE_OTHER): Payer: 59 | Admitting: Psychiatry

## 2022-10-13 ENCOUNTER — Encounter: Payer: Self-pay | Admitting: Psychiatry

## 2022-10-13 ENCOUNTER — Ambulatory Visit: Payer: 59

## 2022-10-13 VITALS — BP 108/70 | HR 70

## 2022-10-13 DIAGNOSIS — F900 Attention-deficit hyperactivity disorder, predominantly inattentive type: Secondary | ICD-10-CM | POA: Diagnosis not present

## 2022-10-13 DIAGNOSIS — Z6379 Other stressful life events affecting family and household: Secondary | ICD-10-CM

## 2022-10-13 DIAGNOSIS — G4700 Insomnia, unspecified: Secondary | ICD-10-CM | POA: Diagnosis not present

## 2022-10-13 DIAGNOSIS — F339 Major depressive disorder, recurrent, unspecified: Secondary | ICD-10-CM

## 2022-10-13 DIAGNOSIS — F411 Generalized anxiety disorder: Secondary | ICD-10-CM

## 2022-10-13 MED ORDER — BELSOMRA 20 MG PO TABS
20.0000 mg | ORAL_TABLET | Freq: Every evening | ORAL | 0 refills | Status: DC
Start: 2022-10-13 — End: 2022-11-13

## 2022-10-13 MED ORDER — PRAMIPEXOLE DIHYDROCHLORIDE 0.5 MG PO TABS: 1.0000 mg | ORAL_TABLET | Freq: Three times a day (TID) | ORAL | 0 refills | Status: DC

## 2022-10-13 NOTE — Progress Notes (Signed)
NURSES NOTE:     Patient arrived for her #38 weekly Spravato treatment. Pt is being treated for Treatment Resistant Depression, the starting dose is 56 mg (2 of the 28 mg) nasal sprays. Pt will be receiving 84 mg (3 of the 28 mg) nasal sprays, this is her maintenance dose. Patient taken to treatment room, I explained and discussed her treatment and how the schedule should go, along with any side effects that may occur. Answered any questions and concerns the patient had. Pt's Spravato is delivered through [place] and she is now billing with her insurance. Spravato medication is stored at doctors office per REMS/FDA guidelines. The medication is required to be locked behind two doors per FDA/REMS Protocol. Medication is also disposed of properly per regulations. All treatments and her vital signs are documented in Spravato REMS per protocol of treatment center regulations.      Vital Signs assessed at 2:05 PM 123/75, pulse 68, pulse ox 92%. Instructed pt to blow her nose and recline back slightly to prevent any of medication dripping out of her nose.  Pt. Given 1st dose (28 mg inhaler), then 5 minutes between 2nd dose and 3rd dose. No complaints of nausea/vomiting reported. Pt did listen to music today, she just reclined back and closed her eyes. After 40 minutes I went to assess her vital signs, no side effects or symptoms reported, B/P 110/68, pulse 70. Informed pt could rest in the chair then I would walk her around to discuss her treatment with Dr. Blanchie Dessert. Nurse was with pt a total of 60 minutes but pt observed for 120 minutes per protocol. Discharge vital signs were at 4:00 PM, B/P 108/70, pulse 70. She reports dissociation but she was clear upon her discharge. She scheduled her next this Wednesday, June 19th.  She is instructed to contact office if needing anything prior to her next treatment.     LOT # [id number] EXP FEB 2027

## 2022-10-13 NOTE — Progress Notes (Signed)
Heather Davila [id number] 06-17-1960 62 y.o.  Subjective:   Patient ID:  Heather Davila is a 62 y.o. (DOB December 22, 1960) female.  Chief Complaint:  Chief Complaint  Patient presents with   Follow-up   Depression   Anxiety    HPI Heather Davila presents to the office today for follow-up of treatment resistant recurrent depression, generalized anxiety disorder and insomnia.  Almost too numerous to count med failures. She is here for Spravato administration for her treatment resistant depression.  05/21/22 appt noted: Patient was administered first dose Spravato 56 mg intranasally today.  The patient experienced the typical dissociation which gradually resolved over the 2-hour period of observation.  There were no complications.  Specifically the patient did not have nausea or vomiting or headache.   Dissociation was mild. Did not experience any emotional relief from disabling depression.wants to increase Spravato to the usual dose.  She is aware insurance is not covering the costs at this time.  No SI acutely. Tolerating meds.    05/23/22 appt noted: Patient was administered Spravato 84 mg intranasally today.  The patient experienced the typical dissociation which gradually resolved over the 2-hour period of observation.  There were no complications.  Specifically the patient did not have nausea or vomiting.   Dissociation was greater than last dose and a little bothersome DT some HA. Tolerating meds.   Mood has not changed significantly thus far with Spravato.  05/27/22 appt noted: Patient was administered Spravato 84 mg intranasally today.  The patient experienced the typical dissociation which gradually resolved over the 2-hour period of observation.  There were no complications.  Specifically the patient did not have nausea or vomiting or headache. Today she has felt a little relief from the pressing heaviness of depression but a little more anxiety.  During administration of Spravato she  listens to [place] and was able to relax a little more with the feeling of dissociation that was mildly bothersome. Husband was also in on the session today and asked some questions about IV ketamine versus nasal spray ketamine.  05/29/22 appt noted: Cancelled today DT hypertension  05/30/22 appt noted: Patient was administered Spravato 84 mg intranasally today.  The patient experienced the typical dissociation which gradually resolved over the 2-hour period of observation.  There were no complications.  Specifically the patient did not have nausea or vomiting or headache. She is seeing some improvement in mood with less continuous depression and less intensity.  Hopefulness is better.   No med complaints or SE  06/05/22 appt noted: Patient was administered Spravato 84 mg intranasally today.  The patient experienced the typical dissociation which gradually resolved over the 2-hour period of observation.  There were no complications.  Specifically the patient did not have nausea or vomiting or headache.  Specifically HA is less of a problem with Spravato. No SE with meds. Depression is improving with less intensity.  Intermittent anxiety easily.  More able to enjoy things.  No new concerns.  06/17/22 appt noted: Patient was administered Spravato 84 mg intranasally today.  The patient experienced the typical dissociation which gradually resolved over the 2-hour period of observation.  There were no complications.  Specifically the patient did not have nausea or vomiting or headache.  Specifically HA is less of a problem with Spravato. No SE with meds. Depression is improved 45% with less intensity.  Intermittent anxiety less easily.  More able to enjoy things.  No new concerns.  More active. Current meds: increased clonazepam to about  1.5 mg HS DT recent insomnia, lexapro 20, Dayvigo 10 mg HS, concerta 36 mg AM, trazodone 100 mg HS.  06/20/22 appt noted: Current meds: increased clonazepam to  about 1.5 mg HS DT recent insomnia, lexapro 20, Dayvigo 10 mg HS, concerta 36 mg AM, trazodone 100 mg HS. Patient was administered Spravato 84 mg intranasally today.  The patient experienced the typical dissociation which gradually resolved over the 2-hour period of observation.  There were no complications.  Specifically the patient did not have nausea or vomiting or headache.  Specifically HA is less of a problem with Spravato. No SE with meds. Depression is improved 45% with less intensity.  Intermittent anxiety less easily.  More able to enjoy things.  No new concerns.  More active.  06/23/22 appt noted:  Current meds: increased clonazepam to about 1.5 mg HS DT recent insomnia, lexapro 20, Dayvigo 10 mg HS, concerta 36 mg AM, trazodone 100 mg HS. Patient was administered Spravato 84 mg intranasally today.  The patient experienced the typical dissociation which gradually resolved over the 2-hour period of observation.  There were no complications.  Specifically the patient did not have nausea or vomiting or headache.  Specifically HA is less of a problem with Spravato. No SE with meds. Intensity of Spravato dissociation varies.  Last week very intesne and this week more typical.  Depression is continuing to improve with better interest and activity and motivation.  Gradual improvement.  Wants to continue.  06/25/22 appt noted: Current meds: increased clonazepam to about 1.5 mg HS DT recent insomnia, lexapro 20, Dayvigo 10 mg HS, concerta 36 mg AM, trazodone 100 mg HS. Patient was administered Spravato 84 mg intranasally today.  The patient experienced the typical dissociation which gradually resolved over the 2-hour period of observation.  There were no complications.  Specifically the patient did not have nausea or vomiting or headache.  Specifically HA is less of a problem with Spravato. No SE with meds. Intensity of Spravato dissociation varies.  No scary and well tolerated. Continues to  gradually improve with Spravato.  She is more motivated and enjoying things more.  H sees progress.  Wants to continue twice weekly until gets maximum response.  Dep is not gone yet.  06/30/22 appt noted:  seen with H Current meds: increased clonazepam to about 1.5 mg HS DT recent insomnia, lexapro 20, Dayvigo 10 mg HS, concerta 36 mg AM, trazodone 100 mg HS. Patient was administered Spravato 84 mg intranasally today.  The patient experienced the typical dissociation which gradually resolved over the 2-hour period of observation.  There were no complications.  Specifically the patient did not have nausea or vomiting or headache.  Specifically HA is less of a problem with Spravato. No SE with meds. Intensity of Spravato dissociation varies.  No scary and well tolerated. Continues to gradually improve with Spravato.  She is more motivated and enjoying things more.  H sees even more progress than she does; more active and smiling.  Wants to continue twice weekly until gets maximum response.  Dep is 80% better.  07/07/22 appt noted: Current meds: increased clonazepam to about 1.5 mg HS DT recent insomnia, lexapro 20, Dayvigo 10 mg HS, concerta 36 mg AM, trazodone 100 mg HS. Patient was administered Spravato 84 mg intranasally today.  The patient experienced the typical dissociation which gradually resolved over the 2-hour period of observation.  There were no complications.  Specifically the patient did not have nausea or vomiting or headache.  Specifically  HA is less of a problem with Spravato. No SE with meds. Intensity of Spravato dissociation varies.  No scary and well tolerated.  Was more intese today. Overall mood is markedly better and please with meds. No changes desired.  07/09/22 appt noted: In transition from Lexapro to sertraline and missing Concerta bc CO crash from it after about 4 hours.  Disc these concerns.  She'll discuss also with Heather Chiquito, NP More dep the last couple of days and  concerned about reducing Spravato frequency while transition from one antidep to another.  Affect less positive. Patient was administered Spravato 84 mg intranasally today.  The patient experienced the typical dissociation which gradually resolved over the 2-hour period of observation.  There were no complications.  Specifically the patient did not have nausea or vomiting or headache.  Specifically HA is less of a problem with Spravato. No SE with meds. Intensity of Spravato dissociation varies.  No scary and well tolerated.   07/14/22 appt noted: Has been more depressed this week.  More trouble with sleep.  Taking clonazepam 1-1.5  of the 0.5 mg tablets.   Trazodone not helping much. She switched back from 200 sertraline to Lexapro 20.  No SE More trouble with motivation.  Some stress with son with special needs. Misses the benevit of Concerta but it was too short acting and crashed in 4-6 hours. Patient was administered Spravato 84 mg intranasally today.  The patient experienced the typical dissociation which gradually resolved over the 2-hour period of observation.  There were no complications.  Specifically the patient did not have nausea or vomiting or headache.  Specifically HA is less of a problem with Spravato. No SE with meds. Intensity of Spravato dissociation varies.  No scary and well tolerated.   07/17/22 appt noted: Patient was administered Spravato 84 mg intranasally today.  The patient experienced the typical dissociation which gradually resolved over the 2-hour period of observation.  There were no complications.  Specifically the patient did not have nausea or vomiting or headache.  Specifically HA is less of a problem with Spravato. No SE with meds. Intensity of Spravato dissociation varies.  Not scary and well tolerated.  Just got Jornay last night but wasn't sure how to take it.  Wants to start and this was discussed.  She felt MPH helped mood and attn but didn't last long enough  and had crash after a few hours on Concerta.  Better for 2 hours after each dose Ritalin 20 mg with mood but then it wore off.  BP up after 3rd dose 160/90 and then took H's amlodipine 5 and BP became normal.  07/22/22 appt noted: Patient was administered Spravato 84 mg intranasally today.  The patient experienced the typical dissociation which gradually resolved over the 2-hour period of observation.  There were no complications.  Specifically the patient did not have nausea or vomiting or headache.  Specifically HA is less of a problem with Spravato. No SE with meds. Intensity of Spravato dissociation varies.  Not scary and well tolerated.  Start Jornay 20 mg over the weekend and did not notice any particular effect good or bad.  Increased to 40 mg. Other psych meds: Clonazepam 0.5 mg tablets 2 nightly, gabapentin dosage varies, Dayvigo 10 mg nightly, Lexapro 20 mg daily, to trazodone 100 mg nightly  07/24/22 appt noted: Patient was administered Spravato 84 mg intranasally today.  The patient experienced the typical dissociation which gradually resolved over the 2-hour period of observation.  There were no complications.  Specifically the patient did not have nausea or vomiting or headache.  Specifically HA is less of a problem with Spravato. No SE with meds. Intensity of Spravato dissociation varies.  Not scary and well tolerated.  Other psych meds: Clonazepam 0.5 mg tablets 2 nightly, gabapentin dosage varies, Dayvigo 10 mg nightly, Lexapro 20 mg daily, to trazodone 100 mg nightly, Jornay 40 mg PM Doing well with Spravato.  Noticed a little effect from the Jornay 40 without SE but would like to increase the dose for ADD and mood.  Sleeping well.  No new concerns.  Still more depressed than she was with th initial benefit of Spravato.  07/30/22 note:  Patient was administered Spravato 84 mg intranasally today.  The patient experienced the typical dissociation which gradually resolved over the 2-hour  period of observation.  There were no complications.  Specifically the patient did not have nausea or vomiting or headache.  Specifically HA is less of a problem with Spravato. Other psych meds: Clonazepam 0.5 mg tablets 2 nightly, gabapentin dosage varies, Dayvigo 10 mg nightly, Lexapro 20 mg daily, to trazodone 100 mg nightly, Jornay 60 mg PM Wants to increase Jornay to 80 mg PM to increase benefit for ADD and depression. No SE She has experienced a major family crisis that threatens to change her daily life from now on.  It has not triggered suicidal thoughts at this time but she is very afraid.  No desire to change medications.  08/04/22 appt noted" Patient was administered Spravato 84 mg intranasally today.  The patient experienced the typical dissociation which gradually resolved over the 2-hour period of observation.  There were no complications.  Specifically the patient did not have nausea or vomiting or headache.  Specifically HA is less of a problem with Spravato. Other psych meds: Clonazepam 0.5 mg tablets 2 nightly, gabapentin dosage varies, Dayvigo 10 mg nightly, Lexapro 20 mg daily, trazodone 100 mg nightly, Jornay 80 mg PM No SE No benefit or SE with increase Jormay 80 mg.  Says Concerta helped ADD and depression but not Jornay.  However duration was insufficient.  Still depressed and wants change to another form of stimulant.  No concerns with other meds. Continues with strong family stressors creating uncertainty about her future but no quick resolution. No SI Trial Cotempla 17.3 and DC Ritalin & Jornay 80  08/11/2022 appointment noted: Patient was administered Spravato 84 mg intranasally today.  The patient experienced the typical dissociation which gradually resolved over the 2-hour period of observation.  There were no complications.  Specifically the patient did not have nausea or vomiting or headache.  Specifically HA is less of a problem with Spravato now vs when started  it.. Other psych meds: Clonazepam 0.5 mg tablets 2 nightly, gabapentin dosage varies, Dayvigo 10 mg nightly, trazodone 100 mg nightly, cotempla 17.3, switch from Lexapro 20 to sertaline 15o mg . No noticeable effects sertraline from for depression but has seen some antianxiety effects from the sertraline.  No side effects noted.  No side effects with the other medicines.  Still is only getting very brief benefit 3 to 4 hours from Cotempla for ADD and mood.  She wants to try the Daytrana patch as we have discussed before. No SE  08/13/22 appt: Patient was administered Spravato 84 mg intranasally today.  The patient experienced the typical dissociation which gradually resolved over the 2-hour period of observation.  There were no complications.  Specifically the patient did not  have nausea or vomiting or headache.  Specifically HA is less of a problem with Spravato now vs when started it.. Other psych meds: Clonazepam 0.5 mg tablets 2 nightly, gabapentin dosage varies, Dayvigo 10 mg nightly, trazodone 100 mg nightly, cotempla 17.3, switch from Lexapro 20 to sertaline 15o mg . No noticeable effects sertraline from for depression but has seen some antianxiety effects from the sertraline.  No side effects noted.  No side effects with the other medicines.  Still is only getting very brief benefit 3 to 4 hours from Cotempla for ADD and mood.  She wants to try the Daytrana patch as we have discussed before.  Hasn't been able to get it yet. No SE Plan.  Continue to try to get Daytrana 30 AM  08/18/22 appt noted: Patient was administered Spravato 84 mg intranasally today.  The patient experienced the typical dissociation which gradually resolved over the 2-hour period of observation.  There were no complications.  Specifically the patient did not have nausea or vomiting or headache.  Specifically HA is less of a problem with Spravato now vs when started it.. Other psych meds: Clonazepam 0.5 mg tablets 2 nightly,  gabapentin dosage varies, Dayvigo 10 mg nightly, trazodone 100 mg nightly, cotempla 17.3, switch from Lexapro 20 to sertaline 15o mg . No noticeable effects sertraline from for depression but has seen some antianxiety effects from the sertraline.  No side effects noted.  No side effects with the other medicines.  Still is only getting very brief benefit 3 to 4 hours from Cotempla for ADD and mood.  She wants to try the Daytrana patch as we have discussed before.  Hasn't been able to get it yet.  Overall depression is some better than it was.   No SE Plan.  Continue to try to get Daytrana 30 AM  08/25/22 appt : No benefit Daytrana.  No SE.  Wants further med changes and interested In retrying pramipexole.   Tolerating meds. Patient was administered Spravato 84 mg intranasally today.  The patient experienced the typical dissociation which gradually resolved over the 2-hour period of observation.  There were no complications.  Specifically the patient did not have nausea or vomiting or headache.  Specifically HA is less of a problem with Spravato now vs when started it.. Other psych meds: Clonazepam 0.5 mg tablets 2 nightly, gabapentin dosage varies, Dayvigo 10 mg nightly, trazodone 100 mg nightly, , switch from Lexapro 20 to sertaline 15o mg . Daytrana 30 AM for a few days. Struggles with depression and no SI.  Reduced interest and mood and motivation and enjoyment and socialization.  08/27/22 appt noted: Psych meds:  Clonazepam 0.5 mg tablets 2 nightly, gabapentin dosage varies, Dayvigo 10 mg nightly, trazodone 100 mg nightly,  sertaline 15o mg . Returned to [employer] AM, pramipexole  Tolerating meds. Patient was administered Spravato 84 mg intranasally today.  The patient experienced the typical dissociation which gradually resolved over the 2-hour period of observation.  There were no complications.  Specifically the patient did not have nausea or vomiting or headache.   Reports taking cotempla bc brief  mood benefit and sig focus and productivity benefit. Took 1 dose pramipexole and felt more dep.  09/01/22 appt noted: Psych meds:  Clonazepam 0.5 mg tablets 2 nightly, gabapentin dosage varies, Dayvigo 10 mg nightly, trazodone 100 mg nightly,  sertaline 15o mg . Returned to Cotempla AM, pramipexole  0.25 mg BID. Tolerating meds now. Patient was administered Spravato 84 mg intranasally today.  The patient experienced the typical dissociation which gradually resolved over the 2-hour period of observation.  There were no complications.  Specifically the patient did not have nausea or vomiting or headache.   Willing to try the pramipexole and will continue 0.25 mg BID this week and then consider increase if tolerated.  Mood is a little better.  Still looking for further improvement.  09/03/22 appt noted: Psych meds:  Clonazepam 0.5 mg tablets 2 nightly, gabapentin dosage varies, Dayvigo 10 mg nightly, trazodone 100 mg nightly,  sertaline 15o mg . Returned to Cotempla AM, pramipexole  0.25 mg tablet 1 and 1/2 tablets twice daily BID. Tolerating meds now. Patient was administered Spravato 84 mg intranasally today.  The patient experienced the typical dissociation which gradually resolved over the 2-hour period of observation.  There were no complications.  Specifically the patient did not have nausea or vomiting or headache.   Depression may be a little better with the parmipexole and is tolerating it well so far.  Still struggling with focus and residual depression.  Tolerating meds without SE.  More hopeful and able to enjoy some things.  09/08/22 appt  Psych meds:  Clonazepam 0.5 mg tablets 2 nightly, gabapentin dosage varies, Dayvigo 10 mg nightly, trazodone 100 mg nightly,  sertaline 15o mg . Returned to Cotempla AM, pramipexole  0.5 mg BID. Tolerating meds now. Patient was administered Spravato 84 mg intranasally today.  The patient experienced the typical dissociation which gradually resolved over the  2-hour period of observation.  There were no complications.  Specifically the patient did not have nausea or vomiting or headache.   She has seen her depression drop from a 10/10 prior to treatment with Spravato to now 4/10 With additional benefit noted when she increased the pramipexole to 0.5 mg twice daily.  She is not having side effects with this like she did in the past. Anxiety levels are also improved. She is sleeping okay with the current medications. Plan: continue pramipexole trial off-label and increase more slowly for TRD.  Increase  Gradually  to 0.75 mg BID  09/11/22 appt noted: Psych meds:  Clonazepam 0.5 mg tablets 2 nightly, gabapentin dosage varies, Dayvigo 10 mg nightly, trazodone 100 mg nightly,  sertaline 15o mg . Returned to Cotempla AM, pramipexole  0.75 mg BID. Tolerating meds now. Patient was administered Spravato 84 mg intranasally today.  The patient experienced the typical dissociation which gradually resolved over the 2-hour period of observation.  There were no complications.  Specifically the patient did not have nausea or vomiting or headache.   She has seen her depression drop from a 10/10 prior to treatment with Spravato to now 4/10 With additional benefit noted when she increased the pramipexole to 0.5 mg twice daily.  She is not having side effects with this like she did in the past. Clearly improving with the pramipexole now and tolerating it.  Satisfied with current meds but would consider increasing if it might be helpful.  09/15/22 appt noted: Psych meds:  Clonazepam 0.5 mg tablets 2 nightly, gabapentin dosage varies, Dayvigo 10 mg nightly, trazodone 100 mg nightly,  sertaline 15o mg . Returned to Cotempla AM, pramipexole  0.75 mg BID too anxious and reduced to 0.5 mg BID. Tolerating meds now. Patient was administered Spravato 84 mg intranasally today.  The patient experienced the typical dissociation which gradually resolved over the 2-hour period of  observation.  There were no complications.  Specifically the patient did not have nausea or vomiting  or headache.   Trouble staying asleep longer than 6 hours.  Doesn't feel she can tolerate pramipexole 0.75 mg BID DT anxiety not experienced at  0.5 mg BID.  Otherwise tolerating meds.  Mood is better with Spravato and pramipexole.   Not sleeping as long with meds.  EMA 6 hours.  Wonders about change Disc concerns about waiting for therapy. Feels ready to reduce Spravato to weekly.    09/25/22 appt noted: Psych meds:  Clonazepam 0.5 mg tablets 2 nightly, gabapentin dosage varies, Dayvigo 10 mg nightly, trazodone 100 mg nightly,  sertaline 15o mg . Returned to Cotempla AM, pramipexole  0.75 mg BID too anxious and reduced to 0.5 mg BID. Tolerating meds now. Patient was administered Spravato 84 mg intranasally today.  The patient experienced the typical dissociation which gradually resolved over the 2-hour period of observation.  There were no complications.  Specifically the patient did not have nausea or vomiting or headache.   Trouble staying asleep longer than 6 hours.  Failed to respond to Seroquel 25 mg HS.  Gone back to [facility].  Belsomra NR. Mood ok until the last few days more dep bc it's been 10 days since last admin.  Wants to continue weekly.   No SE.  No med changes desired.  09/29/22 appt noted: Psych meds:  Clonazepam 0.5 mg tablets 2 nightly, gabapentin dosage varies, Dayvigo 10 mg nightly, trazodone 100 mg nightly,  sertaline 15o mg . Returned to Cotempla AM, pramipexole  0.75 mg BID too anxious and reduced to 0.5 mg BID. Tolerating meds now. Patient was administered Spravato 84 mg intranasally today.  The patient experienced the typical dissociation which gradually resolved over the 2-hour period of observation.  There were no complications.  Specifically the patient did not have nausea or vomiting or headache.   Trouble staying asleep longer than 6 hours.  Plan: Continue sertaline to  200 mg daily.  It has helped anxiety but not depression.   for insomnia, clonazepam 1 mg HS. trazodone doesn't work well but helps some Continue Dayvigo 10 HS.  Failed resp Belsomra 15 and seroquel 25 She wants to continue Cotempla or Concerta bc helps mood early in day and focus  benefit lasts longer.  Have not been able to get stimulant to be consitently effective throughout the day. continue pramipexole trial off-label and reduce back to  TRD 0.5 mg BID Spravato weekly.  10/13/22 appt noted: Not sleeping as well.  Wants to increase Spravato to twice weekly bc feeling more dep close to the next sched date.  Just the day or 2 before.  Marland Kitchen   [facility]    Flowsheet Row Video Visit from 01/03/2022 in [facility]  AIMS Total Score 0      ECT-MADRS    Flowsheet Row Clinical Support from 08/13/2022 in [facility] Video Visit from 04/29/2022 in [facility] Video Visit from 02/06/2022 in [facility]  MADRS Total Score 27 44 23      GAD-7    Flowsheet Row Office Visit from 08/22/2022 in [facility] [place]  Total GAD-7 Score 7      PHQ2-9    Flowsheet Row Office Visit from 08/22/2022 in [facility] [place] Office Visit from 08/19/2022 in [facility] Video Visit from 02/06/2022 in [facility] Office Visit from 11/16/2020 in [facility] at [employer] from 04/19/2018 in [facility] at [employer] Total Score 3 0 6 0 0  PHQ-9 Total Score 5 -- 14 -- 2        Past Psychiatric Medication Trials: Trintellix - Initially effective and then was not effective when re-started Paxil- Ineffective. Helped initially. Prozac-Insomnia Lexapro- Effective and then no longer as effective Zoloft-  Initially effective and then not as effective.  Viibryd Cymbalta- Ineffective. Helped initially. Effexor XR- Effective, well tolerated. Pristiq- Increased anxiety at 150 mg daily Wellbutrin XL-Ineffective.May have increased anxiety. Amitriptyline Nortriptyline-Caused irritability, constipation Auvelity- ineffective  Abilify- Helpful but caused severe insomnia. Rexulti-Helpful but caused insomnia. Vraylar Seroquel - Ineffective Risperdal Olanzapine- Ineffective Latuda- Ineffective Caplyta  Klonopin- Effective Temazepam- Took during menopause Lunesta- Ineffective Ambien-Ineffective Sonata- Ineffective Dayvigo- partially effective  Failed resp Belsomra 15 and seroquel 25 Trazodone-  somewhat effective Remeron-Ineffective. Does not recall wt gain.  Quetiapine 25 NR  Adderall- Took once and had adverse effect. Concerta- Effective but only 4-6 hours Jornay 80 NR Metadate-Not as effective as Concerta Dexmethylphenidate- Not as effective as Concerta Azstarys- some side effects. Not as effective.  Ritalin short acting and SE anxiety & HTN @20  TID Cotemplay 17.3  Daytrana  Lamictal- May have had an adverse effects. "Felt weird." Reports worsening s/s.  Lithium Gabapentin Pramipexole- "felt really weird."   ECT #20 with minimal response. H says it was partly helpful.  AIMS    Flowsheet Row Video Visit from 01/03/2022 in [facility]  AIMS Total Score 0      ECT-MADRS    Flowsheet Row Clinical Support from 08/13/2022 in [facility] Video Visit from 04/29/2022 in [facility] Video Visit from 02/06/2022 in [facility]  MADRS Total Score 27 44 23      GAD-7    Flowsheet Row Office Visit from 08/22/2022 in [facility] [place]  Total GAD-7 Score 7      PHQ2-9    Flowsheet Row Office Visit from 08/22/2022 in [facility] [place] Office Visit from 08/19/2022 in [facility] at [facility] Video Visit from 02/06/2022 in [facility] Office Visit from 11/16/2020 in [facility] at [place] Office Visit from 04/19/2018 in [facility] Total Score 3 0 6 0 0  PHQ-9 Total Score 5 -- 14 -- 2        Review of Systems:  Review of Systems  Neurological:  Negative for tremors.  Psychiatric/Behavioral:  Positive for dysphoric mood and sleep disturbance. Negative for decreased concentration. The patient is nervous/anxious.     Medications: I have reviewed the patient's current medications.  Current Outpatient Medications  Medication Sig Dispense Refill   clonazePAM (KLONOPIN) 0.5 MG tablet 2 tablets at night and 1 tablet as needed daily for panic anxiety 75 tablet 1   Esketamine HCl, 84 MG Dose, (SPRAVATO, 84 MG DOSE,) 28 MG/DEVICE SOPK USE 3 SPRAYS IN EACH NOSTRIL EVERY 3 DAYS 3 each 1   Estradiol (VAGIFEM) 10 MCG TABS vaginal tablet Place 1 tablet (10 mcg total) vaginally 2 (two) times a week. 24 tablet 3   MAGNESIUM PO Take by mouth.     Methylphenidate (COTEMPLA XR-ODT) 17.3 MG TBED Take 1 tablet (17.3 mg total) by mouth every morning. 30 tablet 0   Multiple  Vitamin (MULTIVITAMIN) tablet Take 1 tablet by mouth daily.     nitrofurantoin, macrocrystal-monohydrate, (MACROBID) 100 MG capsule Take 1 capsule (100 mg total) by mouth 2 (two) times daily. 10 capsule 0   phenazopyridine (PYRIDIUM) 200 MG tablet Take 1 tablet (200 mg total) by mouth 3 (three) times daily as needed for pain. 10 tablet 0   QUEtiapine (SEROQUEL) 25 MG tablet Take 1 tablet (25 mg total) by mouth at bedtime. 30 tablet 0   sertraline (ZOLOFT) 100 MG tablet Take 2 tablets (200 mg total) by mouth daily. 180 tablet 0   Suvorexant (BELSOMRA) 20 MG TABS Take 1 tablet (20 mg total) by mouth at bedtime.  30 tablet 0   valACYclovir (VALTREX) 500 MG tablet Take one twice daily for 3 days with any symptoms 30 tablet 3   gabapentin (NEURONTIN) 800 MG tablet Take 1 tablet (800 mg total) by mouth at bedtime. 90 tablet 1   pramipexole (MIRAPEX) 0.5 MG tablet Take 2 tablets (1 mg total) by mouth 3 (three) times daily. 90 tablet 0   No current facility-administered medications for this visit.    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   TRANSAMINASES, SERUM, ELEVATED 08/14/2009    Past Medical History, Surgical history, Social history, and Family history were reviewed and updated as appropriate.   Please see review of systems for further details on the patient's review from today.   Objective:   Physical Exam:  LMP 12/27/2007 (Exact Date)   Physical Exam Constitutional:      General: She is not in acute distress. Musculoskeletal:        General: No deformity.  Neurological:     Mental Status: She is alert and oriented to person, place, and time.     Coordination: Coordination normal.  Psychiatric:        Attention and Perception: Attention and perception normal. She does not perceive auditory hallucinations.        Mood and Affect: Mood is anxious and depressed. Affect is not labile or blunt.        Speech: Speech normal. Speech is not slurred.        Behavior: Behavior normal.        Thought Content: Thought content normal. Thought content is not delusional. Thought content does not include homicidal or suicidal ideation. Thought content does not include suicidal plan.        Cognition and Memory:  Cognition and memory normal.        Judgment: Judgment normal.     Comments: Insight intact Mood ok generally. Affect blunted mildly     Lab Review:     Component Value Date/Time   NA 138 04/22/2022 0932   K 4.1 04/22/2022 0932   CL 98 04/22/2022 0932   CO2 31 04/22/2022 0932   GLUCOSE 102 (H) 04/22/2022 0932   BUN 10 04/22/2022 0932   CREATININE 0.71 04/22/2022 0932   CREATININE 0.68 01/26/2020 0920   CALCIUM 10.5 04/22/2022 0932   PROT 8.3 04/22/2022 0932  ALBUMIN 5.3 (H) 04/22/2022 0932   AST 82 (H) 04/22/2022 0932   ALT 118 (H) 04/22/2022 0932   ALKPHOS 74 04/22/2022 0932   BILITOT 0.3 04/22/2022 0932   GFRNONAA >90 05/01/2014 1645   GFRAA >90 05/01/2014 1645       Component Value Date/Time   WBC 3.3 (L) 04/22/2022 0932   RBC 4.25 04/22/2022 0932   HGB 14.3 04/22/2022 0932   HGB 14.1 10/26/2014 1034   HCT 40.7 04/22/2022 0932   PLT 245.0 04/22/2022 0932   MCV 95.7 04/22/2022 0932   MCH 34.4 (H) 01/26/2020 0920   MCHC 35.1 04/22/2022 0932   RDW 13.2 04/22/2022 0932   LYMPHSABS 0.9 04/22/2022 0932   MONOABS 0.2 04/22/2022 0932   EOSABS 0.1 04/22/2022 0932   BASOSABS 0.0 04/22/2022 0932    No results found for: "POCLITH", "LITHIUM"   No results found for: "PHENYTOIN", "PHENOBARB", "VALPROATE", "CBMZ"   .res Assessment: Plan:    Heather Davila was seen today for follow-up, depression and anxiety.  Diagnoses and all orders for this visit:  Recurrent major depression resistant to treatment (HCC) -     pramipexole (MIRAPEX) 0.5 MG tablet; Take 2 tablets (1 mg total) by mouth 3 (three) times daily.  Generalized anxiety disorder  Insomnia, unspecified type -     Suvorexant (BELSOMRA) 20 MG TABS; Take 1 tablet (20 mg total) by mouth at bedtime.  Attention deficit hyperactivity disorder (ADHD), predominantly inattentive type  Stressful life event affecting family     Pt seen for TRD and administration of Spravato.  Patient was administered Spravato 84 mg  intranasally today.  The patient experienced the typical dissociation which gradually resolved over the 2-hour period of observation.  There were no complications.  Specifically the patient did not have nausea or vomiting. But mild headache.  Blood pressures remained within normal ranges at the 40-minute and 2-hour follow-up intervals.  By the time the 2-hour observation period was met the patient was alert and oriented and able to exit without assistance.  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  See nursing note for further details.Tolerated the Spravato to 84mg  . Jcontinue  Spravato weekly vs increase to twice weekly.    Consider switch Spravato to MAOI  We discussed the short-term risks associated with benzodiazepines including sedation and increased fall risk among others.  Discussed long-term side effect risk including dependence, potential withdrawal symptoms, and the potential eventual dose-related risk of dementia.  But recent studies from 2020 dispute this association between benzodiazepines and dementia risk. Newer studies in 2020 do not support an association with dementia.  Continue sertraline to 200 mg daily.  It has helped anxiety but not depression.    for insomnia, clonazepam 1 mg HS. trazodone doesn't work well but helps some Continue Dayvigo 10 HS.  Failed resp Belsomra 15 and seroquel 25  She wants to continue Cotempla or Concerta bc helps mood early in day and focus  benefit lasts longer.  Have not been able to get stimulant to be consitently effective throughout the day.  Trial for off label TRD increase pramipexole 1 mg TID She is tolerating this without problems so far.  Disc SE incl risk impulsivity etc.  Clearly better with it.   Crisis intervention around the events that have occurred in her family that may change her life from this point forward.  She is managing with expected  emotional distress but not suicidal  thought.    FU weekly Spravato .  Meredith Staggers, MD, DFAPA    Please see After Visit Summary for patient specific instructions.  Future Appointments  Date Time Provider Department Center  10/20/2022 10:15 AM [name], MD [facility]  10/20/2022  2:00 PM Cottle, Steva Ready., MD [facility] None  10/20/2022  2:00 PM CP-NURSE [facility] None  09/18/2023  8:15 AM Jerene Bears, MD [facility]    No orders of the defined types were placed in this encounter.   ------------------------------- [id number]

## 2022-10-14 ENCOUNTER — Other Ambulatory Visit: Payer: Self-pay

## 2022-10-14 ENCOUNTER — Telehealth: Payer: Self-pay | Admitting: Psychiatry

## 2022-10-14 DIAGNOSIS — F339 Major depressive disorder, recurrent, unspecified: Secondary | ICD-10-CM

## 2022-10-14 MED ORDER — PRAMIPEXOLE DIHYDROCHLORIDE 0.5 MG PO TABS
1.0000 mg | ORAL_TABLET | Freq: Three times a day (TID) | ORAL | 0 refills | Status: DC
Start: 2022-10-14 — End: 2022-10-20

## 2022-10-14 NOTE — Telephone Encounter (Signed)
Pharmacy sent PA Request for [facility] 20mg   , [employer]

## 2022-10-15 NOTE — Telephone Encounter (Signed)
Prior authorization submitted for Belsomra 20 mg to [facility], approval received effective from 09/15/2022-10/15/2023, Case ID [id number]

## 2022-10-20 ENCOUNTER — Ambulatory Visit: Payer: Managed Care, Other (non HMO) | Admitting: Family Medicine

## 2022-10-20 ENCOUNTER — Ambulatory Visit (INDEPENDENT_AMBULATORY_CARE_PROVIDER_SITE_OTHER): Payer: 59 | Admitting: Psychiatry

## 2022-10-20 ENCOUNTER — Ambulatory Visit: Payer: 59

## 2022-10-20 ENCOUNTER — Encounter: Payer: Self-pay | Admitting: Psychiatry

## 2022-10-20 ENCOUNTER — Encounter: Payer: Self-pay | Admitting: Family Medicine

## 2022-10-20 VITALS — BP 132/80 | HR 56

## 2022-10-20 VITALS — BP 104/66 | HR 111 | Temp 97.7°F | Ht 60.0 in | Wt 82.2 lb

## 2022-10-20 DIAGNOSIS — F339 Major depressive disorder, recurrent, unspecified: Secondary | ICD-10-CM

## 2022-10-20 DIAGNOSIS — F419 Anxiety disorder, unspecified: Secondary | ICD-10-CM

## 2022-10-20 DIAGNOSIS — F900 Attention-deficit hyperactivity disorder, predominantly inattentive type: Secondary | ICD-10-CM

## 2022-10-20 DIAGNOSIS — G4700 Insomnia, unspecified: Secondary | ICD-10-CM

## 2022-10-20 DIAGNOSIS — R030 Elevated blood-pressure reading, without diagnosis of hypertension: Secondary | ICD-10-CM

## 2022-10-20 DIAGNOSIS — F411 Generalized anxiety disorder: Secondary | ICD-10-CM | POA: Diagnosis not present

## 2022-10-20 MED ORDER — PRAMIPEXOLE DIHYDROCHLORIDE 1 MG PO TABS
1.0000 mg | ORAL_TABLET | Freq: Four times a day (QID) | ORAL | 0 refills | Status: DC
Start: 2022-10-20 — End: 2022-11-18

## 2022-10-20 MED ORDER — CLONAZEPAM 0.5 MG PO TABS: ORAL_TABLET | ORAL | 1 refills | Status: DC

## 2022-10-20 MED ORDER — COTEMPLA XR-ODT 17.3 MG PO TBED
17.3000 mg | EXTENDED_RELEASE_TABLET | Freq: Every morning | ORAL | 0 refills | Status: DC
Start: 2022-10-20 — End: 2022-11-18

## 2022-10-20 MED ORDER — VALSARTAN 80 MG PO TABS: 80.0000 mg | ORAL_TABLET | Freq: Every day | ORAL | 3 refills | Status: AC

## 2022-10-20 NOTE — Patient Instructions (Signed)
Set up one month follow up and bring  your cuff at follow up.   ? ? ?

## 2022-10-20 NOTE — Progress Notes (Signed)
Heather Davila CE:[id number] 1960-10-27 62 y.o.  Subjective:   Patient ID:  Heather Davila is a 62 y.o. (DOB 1960/10/01) female.  Chief Complaint:  Chief Complaint  Patient presents with   Follow-up   Depression   Anxiety   Stress   Sleeping Problem   ADD    HPI Heather Davila presents to the office today for follow-up of treatment resistant recurrent depression, generalized anxiety disorder and insomnia.  Almost too numerous to count med failures. She is here for Spravato administration for her treatment resistant depression.  05/21/22 appt noted: Patient was administered first dose Spravato 56 mg intranasally today.  The patient experienced the typical dissociation which gradually resolved over the 2-hour period of observation.  There were no complications.  Specifically the patient did not have nausea or vomiting or headache.   Dissociation was mild. Did not experience any emotional relief from disabling depression.wants to increase Spravato to the usual dose.  She is aware insurance is not covering the costs at this time.  No SI acutely. Tolerating meds.    05/23/22 appt noted: Patient was administered Spravato 84 mg intranasally today.  The patient experienced the typical dissociation which gradually resolved over the 2-hour period of observation.  There were no complications.  Specifically the patient did not have nausea or vomiting.   Dissociation was greater than last dose and a little bothersome DT some HA. Tolerating meds.   Mood has not changed significantly thus far with Spravato.  05/27/22 appt noted: Patient was administered Spravato 84 mg intranasally today.  The patient experienced the typical dissociation which gradually resolved over the 2-hour period of observation.  There were no complications.  Specifically the patient did not have nausea or vomiting or headache. Today she has felt a little relief from the pressing heaviness of depression but a little more anxiety.   During administration of Spravato she listens to [place] and was able to relax a little more with the feeling of dissociation that was mildly bothersome. Husband was also in on the session today and asked some questions about IV ketamine versus nasal spray ketamine.  05/29/22 appt noted: Cancelled today DT hypertension  05/30/22 appt noted: Patient was administered Spravato 84 mg intranasally today.  The patient experienced the typical dissociation which gradually resolved over the 2-hour period of observation.  There were no complications.  Specifically the patient did not have nausea or vomiting or headache. She is seeing some improvement in mood with less continuous depression and less intensity.  Hopefulness is better.   No med complaints or SE  06/05/22 appt noted: Patient was administered Spravato 84 mg intranasally today.  The patient experienced the typical dissociation which gradually resolved over the 2-hour period of observation.  There were no complications.  Specifically the patient did not have nausea or vomiting or headache.  Specifically HA is less of a problem with Spravato. No SE with meds. Depression is improving with less intensity.  Intermittent anxiety easily.  More able to enjoy things.  No new concerns.  06/17/22 appt noted: Patient was administered Spravato 84 mg intranasally today.  The patient experienced the typical dissociation which gradually resolved over the 2-hour period of observation.  There were no complications.  Specifically the patient did not have nausea or vomiting or headache.  Specifically HA is less of a problem with Spravato. No SE with meds. Depression is improved 45% with less intensity.  Intermittent anxiety less easily.  More able to enjoy things.  No new  concerns.  More active. Current meds: increased clonazepam to about 1.5 mg HS DT recent insomnia, lexapro 20, Dayvigo 10 mg HS, concerta 36 mg AM, trazodone 100 mg HS.  06/20/22 appt  noted: Current meds: increased clonazepam to about 1.5 mg HS DT recent insomnia, lexapro 20, Dayvigo 10 mg HS, concerta 36 mg AM, trazodone 100 mg HS. Patient was administered Spravato 84 mg intranasally today.  The patient experienced the typical dissociation which gradually resolved over the 2-hour period of observation.  There were no complications.  Specifically the patient did not have nausea or vomiting or headache.  Specifically HA is less of a problem with Spravato. No SE with meds. Depression is improved 45% with less intensity.  Intermittent anxiety less easily.  More able to enjoy things.  No new concerns.  More active.  06/23/22 appt noted:  Current meds: increased clonazepam to about 1.5 mg HS DT recent insomnia, lexapro 20, Dayvigo 10 mg HS, concerta 36 mg AM, trazodone 100 mg HS. Patient was administered Spravato 84 mg intranasally today.  The patient experienced the typical dissociation which gradually resolved over the 2-hour period of observation.  There were no complications.  Specifically the patient did not have nausea or vomiting or headache.  Specifically HA is less of a problem with Spravato. No SE with meds. Intensity of Spravato dissociation varies.  Last week very intesne and this week more typical.  Depression is continuing to improve with better interest and activity and motivation.  Gradual improvement.  Wants to continue.  06/25/22 appt noted: Current meds: increased clonazepam to about 1.5 mg HS DT recent insomnia, lexapro 20, Dayvigo 10 mg HS, concerta 36 mg AM, trazodone 100 mg HS. Patient was administered Spravato 84 mg intranasally today.  The patient experienced the typical dissociation which gradually resolved over the 2-hour period of observation.  There were no complications.  Specifically the patient did not have nausea or vomiting or headache.  Specifically HA is less of a problem with Spravato. No SE with meds. Intensity of Spravato dissociation varies.  No  scary and well tolerated. Continues to gradually improve with Spravato.  She is more motivated and enjoying things more.  H sees progress.  Wants to continue twice weekly until gets maximum response.  Dep is not gone yet.  06/30/22 appt noted:  seen with H Current meds: increased clonazepam to about 1.5 mg HS DT recent insomnia, lexapro 20, Dayvigo 10 mg HS, concerta 36 mg AM, trazodone 100 mg HS. Patient was administered Spravato 84 mg intranasally today.  The patient experienced the typical dissociation which gradually resolved over the 2-hour period of observation.  There were no complications.  Specifically the patient did not have nausea or vomiting or headache.  Specifically HA is less of a problem with Spravato. No SE with meds. Intensity of Spravato dissociation varies.  No scary and well tolerated. Continues to gradually improve with Spravato.  She is more motivated and enjoying things more.  H sees even more progress than she does; more active and smiling.  Wants to continue twice weekly until gets maximum response.  Dep is 80% better.  07/07/22 appt noted: Current meds: increased clonazepam to about 1.5 mg HS DT recent insomnia, lexapro 20, Dayvigo 10 mg HS, concerta 36 mg AM, trazodone 100 mg HS. Patient was administered Spravato 84 mg intranasally today.  The patient experienced the typical dissociation which gradually resolved over the 2-hour period of observation.  There were no complications.  Specifically the patient  did not have nausea or vomiting or headache.  Specifically HA is less of a problem with Spravato. No SE with meds. Intensity of Spravato dissociation varies.  No scary and well tolerated.  Was more intese today. Overall mood is markedly better and please with meds. No changes desired.  07/09/22 appt noted: In transition from Lexapro to sertraline and missing Concerta bc CO crash from it after about 4 hours.  Disc these concerns.  She'll discuss also with Corie Chiquito,  NP More dep the last couple of days and concerned about reducing Spravato frequency while transition from one antidep to another.  Affect less positive. Patient was administered Spravato 84 mg intranasally today.  The patient experienced the typical dissociation which gradually resolved over the 2-hour period of observation.  There were no complications.  Specifically the patient did not have nausea or vomiting or headache.  Specifically HA is less of a problem with Spravato. No SE with meds. Intensity of Spravato dissociation varies.  No scary and well tolerated.   07/14/22 appt noted: Has been more depressed this week.  More trouble with sleep.  Taking clonazepam 1-1.5  of the 0.5 mg tablets.   Trazodone not helping much. She switched back from 200 sertraline to Lexapro 20.  No SE More trouble with motivation.  Some stress with son with special needs. Misses the benevit of Concerta but it was too short acting and crashed in 4-6 hours. Patient was administered Spravato 84 mg intranasally today.  The patient experienced the typical dissociation which gradually resolved over the 2-hour period of observation.  There were no complications.  Specifically the patient did not have nausea or vomiting or headache.  Specifically HA is less of a problem with Spravato. No SE with meds. Intensity of Spravato dissociation varies.  No scary and well tolerated.   07/17/22 appt noted: Patient was administered Spravato 84 mg intranasally today.  The patient experienced the typical dissociation which gradually resolved over the 2-hour period of observation.  There were no complications.  Specifically the patient did not have nausea or vomiting or headache.  Specifically HA is less of a problem with Spravato. No SE with meds. Intensity of Spravato dissociation varies.  Not scary and well tolerated.  Just got Jornay last night but wasn't sure how to take it.  Wants to start and this was discussed.  She felt MPH helped  mood and attn but didn't last long enough and had crash after a few hours on Concerta.  Better for 2 hours after each dose Ritalin 20 mg with mood but then it wore off.  BP up after 3rd dose 160/90 and then took H's amlodipine 5 and BP became normal.  07/22/22 appt noted: Patient was administered Spravato 84 mg intranasally today.  The patient experienced the typical dissociation which gradually resolved over the 2-hour period of observation.  There were no complications.  Specifically the patient did not have nausea or vomiting or headache.  Specifically HA is less of a problem with Spravato. No SE with meds. Intensity of Spravato dissociation varies.  Not scary and well tolerated.  Start Jornay 20 mg over the weekend and did not notice any particular effect good or bad.  Increased to 40 mg. Other psych meds: Clonazepam 0.5 mg tablets 2 nightly, gabapentin dosage varies, Dayvigo 10 mg nightly, Lexapro 20 mg daily, to trazodone 100 mg nightly  07/24/22 appt noted: Patient was administered Spravato 84 mg intranasally today.  The patient experienced the typical dissociation  which gradually resolved over the 2-hour period of observation.  There were no complications.  Specifically the patient did not have nausea or vomiting or headache.  Specifically HA is less of a problem with Spravato. No SE with meds. Intensity of Spravato dissociation varies.  Not scary and well tolerated.  Other psych meds: Clonazepam 0.5 mg tablets 2 nightly, gabapentin dosage varies, Dayvigo 10 mg nightly, Lexapro 20 mg daily, to trazodone 100 mg nightly, Jornay 40 mg PM Doing well with Spravato.  Noticed a little effect from the Jornay 40 without SE but would like to increase the dose for ADD and mood.  Sleeping well.  No new concerns.  Still more depressed than she was with th initial benefit of Spravato.  07/30/22 note:  Patient was administered Spravato 84 mg intranasally today.  The patient experienced the typical  dissociation which gradually resolved over the 2-hour period of observation.  There were no complications.  Specifically the patient did not have nausea or vomiting or headache.  Specifically HA is less of a problem with Spravato. Other psych meds: Clonazepam 0.5 mg tablets 2 nightly, gabapentin dosage varies, Dayvigo 10 mg nightly, Lexapro 20 mg daily, to trazodone 100 mg nightly, Jornay 60 mg PM Wants to increase Jornay to 80 mg PM to increase benefit for ADD and depression. No SE She has experienced a major family crisis that threatens to change her daily life from now on.  It has not triggered suicidal thoughts at this time but she is very afraid.  No desire to change medications.  08/04/22 appt noted" Patient was administered Spravato 84 mg intranasally today.  The patient experienced the typical dissociation which gradually resolved over the 2-hour period of observation.  There were no complications.  Specifically the patient did not have nausea or vomiting or headache.  Specifically HA is less of a problem with Spravato. Other psych meds: Clonazepam 0.5 mg tablets 2 nightly, gabapentin dosage varies, Dayvigo 10 mg nightly, Lexapro 20 mg daily, trazodone 100 mg nightly, Jornay 80 mg PM No SE No benefit or SE with increase Jormay 80 mg.  Says Concerta helped ADD and depression but not Jornay.  However duration was insufficient.  Still depressed and wants change to another form of stimulant.  No concerns with other meds. Continues with strong family stressors creating uncertainty about her future but no quick resolution. No SI Trial Cotempla 17.3 and DC Ritalin & Jornay 80  08/11/2022 appointment noted: Patient was administered Spravato 84 mg intranasally today.  The patient experienced the typical dissociation which gradually resolved over the 2-hour period of observation.  There were no complications.  Specifically the patient did not have nausea or vomiting or headache.  Specifically HA is less  of a problem with Spravato now vs when started it.. Other psych meds: Clonazepam 0.5 mg tablets 2 nightly, gabapentin dosage varies, Dayvigo 10 mg nightly, trazodone 100 mg nightly, cotempla 17.3, switch from Lexapro 20 to sertaline 15o mg . No noticeable effects sertraline from for depression but has seen some antianxiety effects from the sertraline.  No side effects noted.  No side effects with the other medicines.  Still is only getting very brief benefit 3 to 4 hours from Cotempla for ADD and mood.  She wants to try the Daytrana patch as we have discussed before. No SE  08/13/22 appt: Patient was administered Spravato 84 mg intranasally today.  The patient experienced the typical dissociation which gradually resolved over the 2-hour period of observation.  There were no complications.  Specifically the patient did not have nausea or vomiting or headache.  Specifically HA is less of a problem with Spravato now vs when started it.. Other psych meds: Clonazepam 0.5 mg tablets 2 nightly, gabapentin dosage varies, Dayvigo 10 mg nightly, trazodone 100 mg nightly, cotempla 17.3, switch from Lexapro 20 to sertaline 15o mg . No noticeable effects sertraline from for depression but has seen some antianxiety effects from the sertraline.  No side effects noted.  No side effects with the other medicines.  Still is only getting very brief benefit 3 to 4 hours from Cotempla for ADD and mood.  She wants to try the Daytrana patch as we have discussed before.  Hasn't been able to get it yet. No SE Plan.  Continue to try to get Daytrana 30 AM  08/18/22 appt noted: Patient was administered Spravato 84 mg intranasally today.  The patient experienced the typical dissociation which gradually resolved over the 2-hour period of observation.  There were no complications.  Specifically the patient did not have nausea or vomiting or headache.  Specifically HA is less of a problem with Spravato now vs when started it.. Other  psych meds: Clonazepam 0.5 mg tablets 2 nightly, gabapentin dosage varies, Dayvigo 10 mg nightly, trazodone 100 mg nightly, cotempla 17.3, switch from Lexapro 20 to sertaline 15o mg . No noticeable effects sertraline from for depression but has seen some antianxiety effects from the sertraline.  No side effects noted.  No side effects with the other medicines.  Still is only getting very brief benefit 3 to 4 hours from Cotempla for ADD and mood.  She wants to try the Daytrana patch as we have discussed before.  Hasn't been able to get it yet.  Overall depression is some better than it was.   No SE Plan.  Continue to try to get Daytrana 30 AM  08/25/22 appt : No benefit Daytrana.  No SE.  Wants further med changes and interested In retrying pramipexole.   Tolerating meds. Patient was administered Spravato 84 mg intranasally today.  The patient experienced the typical dissociation which gradually resolved over the 2-hour period of observation.  There were no complications.  Specifically the patient did not have nausea or vomiting or headache.  Specifically HA is less of a problem with Spravato now vs when started it.. Other psych meds: Clonazepam 0.5 mg tablets 2 nightly, gabapentin dosage varies, Dayvigo 10 mg nightly, trazodone 100 mg nightly, , switch from Lexapro 20 to sertaline 15o mg . Daytrana 30 AM for a few days. Struggles with depression and no SI.  Reduced interest and mood and motivation and enjoyment and socialization.  08/27/22 appt noted: Psych meds:  Clonazepam 0.5 mg tablets 2 nightly, gabapentin dosage varies, Dayvigo 10 mg nightly, trazodone 100 mg nightly,  sertaline 15o mg . Returned to [employer] AM, pramipexole  Tolerating meds. Patient was administered Spravato 84 mg intranasally today.  The patient experienced the typical dissociation which gradually resolved over the 2-hour period of observation.  There were no complications.  Specifically the patient did not have nausea or vomiting  or headache.   Reports taking cotempla bc brief mood benefit and sig focus and productivity benefit. Took 1 dose pramipexole and felt more dep.  09/01/22 appt noted: Psych meds:  Clonazepam 0.5 mg tablets 2 nightly, gabapentin dosage varies, Dayvigo 10 mg nightly, trazodone 100 mg nightly,  sertaline 15o mg . Returned to Cotempla AM, pramipexole  0.25 mg BID. Tolerating  meds now. Patient was administered Spravato 84 mg intranasally today.  The patient experienced the typical dissociation which gradually resolved over the 2-hour period of observation.  There were no complications.  Specifically the patient did not have nausea or vomiting or headache.   Willing to try the pramipexole and will continue 0.25 mg BID this week and then consider increase if tolerated.  Mood is a little better.  Still looking for further improvement.  09/03/22 appt noted: Psych meds:  Clonazepam 0.5 mg tablets 2 nightly, gabapentin dosage varies, Dayvigo 10 mg nightly, trazodone 100 mg nightly,  sertaline 15o mg . Returned to Cotempla AM, pramipexole  0.25 mg tablet 1 and 1/2 tablets twice daily BID. Tolerating meds now. Patient was administered Spravato 84 mg intranasally today.  The patient experienced the typical dissociation which gradually resolved over the 2-hour period of observation.  There were no complications.  Specifically the patient did not have nausea or vomiting or headache.   Depression may be a little better with the parmipexole and is tolerating it well so far.  Still struggling with focus and residual depression.  Tolerating meds without SE.  More hopeful and able to enjoy some things.  09/08/22 appt  Psych meds:  Clonazepam 0.5 mg tablets 2 nightly, gabapentin dosage varies, Dayvigo 10 mg nightly, trazodone 100 mg nightly,  sertaline 15o mg . Returned to Cotempla AM, pramipexole  0.5 mg BID. Tolerating meds now. Patient was administered Spravato 84 mg intranasally today.  The patient experienced the  typical dissociation which gradually resolved over the 2-hour period of observation.  There were no complications.  Specifically the patient did not have nausea or vomiting or headache.   She has seen her depression drop from a 10/10 prior to treatment with Spravato to now 4/10 With additional benefit noted when she increased the pramipexole to 0.5 mg twice daily.  She is not having side effects with this like she did in the past. Anxiety levels are also improved. She is sleeping okay with the current medications. Plan: continue pramipexole trial off-label and increase more slowly for TRD.  Increase  Gradually  to 0.75 mg BID  09/11/22 appt noted: Psych meds:  Clonazepam 0.5 mg tablets 2 nightly, gabapentin dosage varies, Dayvigo 10 mg nightly, trazodone 100 mg nightly,  sertaline 15o mg . Returned to Cotempla AM, pramipexole  0.75 mg BID. Tolerating meds now. Patient was administered Spravato 84 mg intranasally today.  The patient experienced the typical dissociation which gradually resolved over the 2-hour period of observation.  There were no complications.  Specifically the patient did not have nausea or vomiting or headache.   She has seen her depression drop from a 10/10 prior to treatment with Spravato to now 4/10 With additional benefit noted when she increased the pramipexole to 0.5 mg twice daily.  She is not having side effects with this like she did in the past. Clearly improving with the pramipexole now and tolerating it.  Satisfied with current meds but would consider increasing if it might be helpful.  09/15/22 appt noted: Psych meds:  Clonazepam 0.5 mg tablets 2 nightly, gabapentin dosage varies, Dayvigo 10 mg nightly, trazodone 100 mg nightly,  sertaline 15o mg . Returned to Cotempla AM, pramipexole  0.75 mg BID too anxious and reduced to 0.5 mg BID. Tolerating meds now. Patient was administered Spravato 84 mg intranasally today.  The patient experienced the typical dissociation  which gradually resolved over the 2-hour period of observation.  There were no  complications.  Specifically the patient did not have nausea or vomiting or headache.   Trouble staying asleep longer than 6 hours.  Doesn't feel she can tolerate pramipexole 0.75 mg BID DT anxiety not experienced at  0.5 mg BID.  Otherwise tolerating meds.  Mood is better with Spravato and pramipexole.   Not sleeping as long with meds.  EMA 6 hours.  Wonders about change Disc concerns about waiting for therapy. Feels ready to reduce Spravato to weekly.    09/25/22 appt noted: Psych meds:  Clonazepam 0.5 mg tablets 2 nightly, gabapentin dosage varies, Dayvigo 10 mg nightly, trazodone 100 mg nightly,  sertaline 15o mg . Returned to Cotempla AM, pramipexole  0.75 mg BID too anxious and reduced to 0.5 mg BID. Tolerating meds now. Patient was administered Spravato 84 mg intranasally today.  The patient experienced the typical dissociation which gradually resolved over the 2-hour period of observation.  There were no complications.  Specifically the patient did not have nausea or vomiting or headache.   Trouble staying asleep longer than 6 hours.  Failed to respond to Seroquel 25 mg HS.  Gone back to [facility].  Belsomra NR. Mood ok until the last few days more dep bc it's been 10 days since last admin.  Wants to continue weekly.   No SE.  No med changes desired.  09/29/22 appt noted: Psych meds:  Clonazepam 0.5 mg tablets 2 nightly, gabapentin dosage varies, Dayvigo 10 mg nightly, trazodone 100 mg nightly,  sertaline 15o mg . Returned to Cotempla AM, pramipexole  0.75 mg BID too anxious and reduced to 0.5 mg BID. Tolerating meds now. Patient was administered Spravato 84 mg intranasally today.  The patient experienced the typical dissociation which gradually resolved over the 2-hour period of observation.  There were no complications.  Specifically the patient did not have nausea or vomiting or headache.   Trouble staying asleep  longer than 6 hours.  Plan: Continue sertaline to 200 mg daily.  It has helped anxiety but not depression.   for insomnia, clonazepam 1 mg HS. trazodone doesn't work well but helps some Continue Dayvigo 10 HS.  Failed resp Belsomra 15 and seroquel 25 She wants to continue Cotempla or Concerta bc helps mood early in day and focus  benefit lasts longer.  Have not been able to get stimulant to be consitently effective throughout the day. continue pramipexole trial off-label and reduce back to  TRD 0.5 mg BID Spravato weekly.  10/13/22 appt noted: Not sleeping as well.  Wants to increase Spravato to twice weekly bc feeling more dep close to the next sched date.  Just the day or 2 before.  .  Plan: Trial for off label TRD increase pramipexole 1 mg TID  10/20/22 appt noted: Psych meds:  Clonazepam 0.5 mg tablets 2 nightly, gabapentin dosage varies, Dayvigo 10 mg nightly, trazodone 100 mg nightly,  sertaline 15o mg . Returned to Cotempla AM, pramipexole  1 mg TID. Tolerating meds now. Patient was administered Spravato 84 mg intranasally today.  The patient experienced the typical dissociation which gradually resolved over the 2-hour period of observation.  There were no complications.  Specifically the patient did not have nausea or vomiting or headache.   Trouble staying asleep longer than 6 hours.  More dep this week and asked to get Spravato twice this week.    AIMS    Flowsheet Row Video Visit from 01/03/2022 in [facility]  AIMS Total Score 0  ECT-MADRS    Flowsheet Row Clinical Support from 08/13/2022 in [facility] Video Visit from 04/29/2022 in [facility] Video Visit from 02/06/2022 in [facility]  MADRS Total Score 27 44 23      GAD-7    Flowsheet Row Office Visit from 08/22/2022 in [facility] [place]  Total GAD-7 Score 7      PHQ2-9     Flowsheet Row Office Visit from 08/22/2022 in [facility] [place] Office Visit from 08/19/2022 in [facility] at [facility] Video Visit from 02/06/2022 in [facility] Office Visit from 11/16/2020 in [facility] [place] Office Visit from 04/19/2018 in [facility] Total Score 3 0 6 0 0  PHQ-9 Total Score 5 -- 14 -- 2        Past Psychiatric Medication Trials: Trintellix - Initially effective and then was not effective when re-started Paxil- Ineffective. Helped initially. Prozac-Insomnia Lexapro- Effective and then no longer as effective Zoloft- Initially effective and then not as effective.  Viibryd Cymbalta- Ineffective. Helped initially. Effexor XR- Effective, well tolerated. Pristiq- Increased anxiety at 150 mg daily Wellbutrin XL-Ineffective.May have increased anxiety. Amitriptyline Nortriptyline-Caused irritability, constipation Auvelity- ineffective  Abilify- Helpful but caused severe insomnia. Rexulti-Helpful but caused insomnia. Vraylar Seroquel - Ineffective Risperdal Olanzapine- Ineffective Latuda- Ineffective Caplyta  Klonopin- Effective Temazepam- Took during menopause Lunesta- Ineffective Ambien-Ineffective Sonata- Ineffective Dayvigo- partially effective  Failed resp Belsomra 15 and seroquel 25 Trazodone-  somewhat effective Remeron-Ineffective. Does not recall wt gain.  Quetiapine 25 NR  Adderall- Took once and had adverse effect. Concerta- Effective but only 4-6 hours Jornay 80 NR Metadate-Not as effective as Concerta Dexmethylphenidate- Not as effective as Concerta Azstarys- some side effects. Not as effective.  Ritalin short acting and SE anxiety & HTN @20  TID Cotemplay 17.3  Daytrana  Lamictal- May have had an adverse effects. "Felt weird." Reports worsening s/s.   Lithium Gabapentin Pramipexole- "felt really weird."   ECT #20 with minimal response. H says it was partly helpful.  AIMS    Flowsheet Row Video Visit from 01/03/2022 in [facility]  AIMS Total Score 0      ECT-MADRS    Flowsheet Row Clinical Support [facility]/13/[facility] Video Visit from 04/29/2022 in [facility] Video Visit from 02/06/2022 in [facility]  MADRS Total Score 27 44 23      GAD-7    Flowsheet Row Office Visit from 08/22/2022 in [facility] [place] [facility] [place]  Total GAD-7 Score 7      PHQ2-9    Flowsheet Row Office Visit from 08/22/2022 in [facility] [place] Office Visit from 08/19/2022 in [facility] at [facility] Video Visit from 02/06/2022 in [facility] Office Visit from 11/16/2020 in [facility] [place] Office Visit from 04/19/2018 in [facility] Total Score 3 0 6 0 0  PHQ-9 Total Score 5 -- 14 -- 2        Review of Systems:  Review of Systems  Neurological:  Negative for dizziness and tremors.  Psychiatric/Behavioral:  Positive for dysphoric mood and sleep disturbance. Negative for decreased concentration. The patient is nervous/anxious.  Medications: I have reviewed the patient's current medications.  Current Outpatient Medications  Medication Sig Dispense Refill   Esketamine HCl, 84 MG Dose, (SPRAVATO, 84 MG DOSE,) 28 MG/DEVICE SOPK USE 3 SPRAYS IN EACH NOSTRIL EVERY 3 DAYS 3 each 1   Estradiol (VAGIFEM) 10 MCG TABS vaginal tablet Place 1 tablet (10 mcg total) vaginally 2 (two) times a week. 24 tablet 3   MAGNESIUM PO Take by mouth.     Multiple Vitamin (MULTIVITAMIN) tablet Take 1 tablet by mouth daily.     sertraline (ZOLOFT) 100 MG tablet  Take 2 tablets (200 mg total) by mouth daily. 180 tablet 0   Suvorexant (BELSOMRA) 20 MG TABS Take 1 tablet (20 mg total) by mouth at bedtime. 30 tablet 0   valACYclovir (VALTREX) 500 MG tablet Take one twice daily for 3 days with any symptoms 30 tablet 3   valsartan (DIOVAN) 80 MG tablet Take 1 tablet (80 mg total) by mouth daily. 90 tablet 3   clonazePAM (KLONOPIN) 0.5 MG tablet 2 tablets at night and 1 tablet as needed daily for panic anxiety 75 tablet 1   Methylphenidate (COTEMPLA XR-ODT) 17.3 MG TBED Take 1 tablet (17.3 mg total) by mouth every morning. 30 tablet 0   pramipexole (MIRAPEX) 1 MG tablet Take 1 tablet (1 mg total) by mouth in the morning, at noon, in the evening, and at bedtime. 120 tablet 0   No current facility-administered medications for this visit.    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   TRANSAMINASES, SERUM, ELEVATED 08/14/2009    Past Medical History, Surgical history, Social history, and Family history were reviewed and updated as appropriate.   Please see review of systems for further details on the patient's review from today.   Objective:   Physical Exam:  LMP 12/27/2007 (Exact Date)   Physical Exam Constitutional:      General: She is not in acute distress. Musculoskeletal:        General: No deformity.  Neurological:     Mental Status: She is alert and oriented to person, place, and time.     Coordination: Coordination normal.  Psychiatric:        Attention and Perception: Attention and perception normal. She does not perceive auditory hallucinations.        Mood  and Affect: Mood is anxious and depressed. Affect is not labile or blunt.        Speech: Speech normal. Speech is not slurred.        Behavior: Behavior normal.        Thought Content: Thought content normal. Thought content is not delusional. Thought content does not include homicidal or suicidal ideation. Thought content does not include suicidal plan.        Cognition and Memory: Cognition and memory normal.        Judgment: Judgment normal.     Comments: Insight intact Moodmore dep lately Affect blunted mildly     Lab Review:     Component Value Date/Time  NA 138 04/22/2022 0932   K 4.1 04/22/2022 0932   CL 98 04/22/2022 0932   CO2 31 04/22/2022 0932   GLUCOSE 102 (H) 04/22/2022 0932   BUN 10 04/22/2022 0932   CREATININE 0.71 04/22/2022 0932   CREATININE 0.68 01/26/2020 0920   CALCIUM 10.5 04/22/2022 0932   PROT 8.3 04/22/2022 0932   ALBUMIN 5.3 (H) 04/22/2022 0932   AST 82 (H) 04/22/2022 0932   ALT 118 (H) 04/22/2022 0932   ALKPHOS 74 04/22/2022 0932   BILITOT 0.3 04/22/2022 0932   GFRNONAA >90 05/01/2014 1645   GFRAA >90 05/01/2014 1645       Component Value Date/Time   WBC 3.3 (L) 04/22/2022 0932   RBC 4.25 04/22/2022 0932   HGB 14.3 04/22/2022 0932   HGB 14.1 10/26/2014 1034   HCT 40.7 04/22/2022 0932   PLT 245.0 04/22/2022 0932   MCV 95.7 04/22/2022 0932   MCH 34.4 (H) 01/26/2020 0920   MCHC 35.1 04/22/2022 0932   RDW 13.2 04/22/2022 0932   LYMPHSABS 0.9 04/22/2022 0932   MONOABS 0.2 04/22/2022 0932   EOSABS 0.1 04/22/2022 0932   BASOSABS 0.0 04/22/2022 0932    No results found for: "POCLITH", "LITHIUM"   No results found for: "PHENYTOIN", "PHENOBARB", "VALPROATE", "CBMZ"   .res Assessment: Plan:    Derricka was seen today for follow-up, depression, anxiety, stress, sleeping problem and add.  Diagnoses and all orders for this visit:  Recurrent major depression resistant to treatment (HCC) -     pramipexole (MIRAPEX) 1 MG tablet; Take 1 tablet (1  mg total) by mouth in the morning, at noon, in the evening, and at bedtime.  Generalized anxiety disorder  Attention deficit hyperactivity disorder (ADHD), predominantly inattentive type -     Methylphenidate (COTEMPLA XR-ODT) 17.3 MG TBED; Take 1 tablet (17.3 mg total) by mouth every morning.  Insomnia, unspecified type -     clonazePAM (KLONOPIN) 0.5 MG tablet; 2 tablets at night and 1 tablet as needed daily for panic anxiety  Anxiety disorder, unspecified type -     clonazePAM (KLONOPIN) 0.5 MG tablet; 2 tablets at night and 1 tablet as needed daily for panic anxiety     Pt seen for TRD and administration of Spravato.  Patient was administered Spravato 84 mg intranasally today.  The patient experienced the typical dissociation which gradually resolved over the 2-hour period of observation.  There were no complications.  Specifically the patient did not have nausea or vomiting. But mild headache.  Blood pressures remained within normal ranges at the 40-minute and 2-hour follow-up intervals.  By the time the 2-hour observation period was met the patient was alert and oriented and able to exit without assistance.  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  See nursing note for further details.Tolerated the Spravato to 84mg  . Jcontinue  Spravato weekly vs increase to twice weekly.  She feels she needs to continue the higher frequency.    Consider switch Spravato to MAOI  We discussed the short-term risks associated with benzodiazepines including sedation and increased fall risk among others.  Discussed long-term side effect risk including dependence, potential withdrawal symptoms, and the potential eventual dose-related risk of dementia.  But recent studies from 2020 dispute this association between benzodiazepines and dementia risk. Newer studies in 2020 do not support an association with  dementia.  Continue sertraline to 200 mg daily.  It has helped anxiety but not  depression.    for insomnia, clonazepam 1 mg HS. trazodone doesn't work well but helps some Continue Dayvigo 10 HS.  Failed resp Belsomra 15 and seroquel 25  She wants to continue Cotempla or Concerta bc helps mood early in day and focus  benefit lasts longer.  Have not been able to get stimulant to be consitently effective throughout the day.  Trial for off label TRD increase pramipexole 1 mg QID She is tolerating this without problems so far.  Disc SE incl risk impulsivity , mania, etc.  Clearly better with it and then seemed to lose effect.  No SE at these dosage.  Crisis intervention around the events that have occurred in her family that may change her life from this point forward.  She is managing with expected emotional distress but not suicidal thought.    FU weekly Spravato .  Meredith Staggers, MD, DFAPA    Please see After Visit Summary for patient specific instructions.  Future Appointments  Date Time Provider Department Center  11/19/2022  7:30 AM Caryl Never, Elberta Fortis, MD [facility]  09/18/2023  8:15 AM Jerene Bears, MD [facility]    No orders of the defined types were placed in this encounter.   ------------------------------- [id number]

## 2022-10-20 NOTE — Progress Notes (Signed)
NURSES NOTE:     Patient arrived for her #38 weekly Spravato treatment. Pt is being treated for Treatment Resistant Depression, the starting dose is 56 mg (2 of the 28 mg) nasal sprays. Pt will be receiving 84 mg (3 of the 28 mg) nasal sprays, this is her maintenance dose. Patient taken to treatment room, I explained and discussed her treatment and how the schedule should go, along with any side effects that may occur. Answered any questions and concerns the patient had. Pt's Spravato is delivered through [place] and she is now billing with her insurance. Spravato medication is stored at doctors office per REMS/FDA guidelines. The medication is required to be locked behind two doors per FDA/REMS Protocol. Medication is also disposed of properly per regulations. All treatments and her vital signs are documented in Spravato REMS per protocol of treatment center regulations.      Vital Signs assessed at 2:00 PM 111/72, pulse 61, pulse ox 92%. Instructed pt to blow her nose and recline back slightly to prevent any of medication dripping out of her nose.  Pt. Given 1st dose (28 mg inhaler), then 5 minutes between 2nd dose and 3rd dose. No complaints of nausea/vomiting reported. Pt did listen to music today, she just reclined back and closed her eyes. After 40 minutes I went to assess her vital signs, no side effects or symptoms reported, B/P 130/79, pulse 61. Informed pt could rest in the chair then I would walk her around to discuss her treatment with Dr. Blanchie Dessert. Nurse was with pt a total of 60 minutes but pt observed for 120 minutes per protocol. Discharge vital signs were at 3:55 PM, B/P 132/80, pulse 56. She reports dissociation but she was clear upon her discharge. She scheduled her next this Wednesday, June 26th.  She is instructed to contact office if needing anything prior to her next treatment.     LOT # [id number] EXP FEB 2027

## 2022-10-20 NOTE — Progress Notes (Signed)
Established Patient Office Visit  Subjective   Patient ID: Heather Davila, female    DOB: [birth year]  Age: 62 y.o. MRN: [id number]  Chief Complaint  Patient presents with   Hypertension    HPI   Heather Davila is seen today with concerns for recent elevated blood pressure.  She has done nursing in the past and has been checking her own blood pressure at home with a manual cuff.  She has had multiple readings ranging 148 -162 systolic and diastolics 88-100.  When her blood pressure has been up this also correlates with intermittent headache and she states she feels "awful "when blood pressure is up.  She states her mother had hypertension later in life.  Heather Davila does not use any alcohol and no recent nonsteroidals.  She actually took a couple of her husband's medications including amlodipine 5 mg and losartan 50 mg which did seem to consistently bring her blood pressure down.  No recent dietary changes.  No specific new stressors.  Her blood pressure is actually quite down today but she did states she took medications above last night.  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   TRANSAMINASES, SERUM, ELEVATED 08/14/2009   Past Surgical History:  Procedure Laterality Date   AUGMENTATION MAMMAPLASTY Bilateral    BREAST ENHANCEMENT SURGERY Bilateral    BUNIONECTOMY     CESAREAN SECTION     X 3   FACELIFT  2022   HYSTEROSCOPY  12/27/2007   and HTA   UPPER GASTROINTESTINAL ENDOSCOPY  2020    reports that she quit smoking about 35 years ago. Her smoking use included cigarettes. She has a 2.50 pack-year smoking history. She has never used smokeless tobacco. She reports that she does not drink alcohol and does not use drugs. family history includes Cancer (age of onset: 53) in her  mother; Colon cancer (age of onset: 88) in her mother; Colon polyps in her mother; Depression in her sister; Diabetes in her paternal uncle; Heart attack (age of onset: 22) in her sister; Heart disease in her paternal grandfather and sister; Heart disease (age of onset: 1) in her father; Heart failure in her paternal aunt; Hypertension in her father and mother; Stroke (age of onset: 42) in her father. Allergies  Allergen Reactions   Ciprofloxacin     REACTION: hives   Oxycodone-Acetaminophen     REACTION: hives    Review of Systems  Constitutional:  Negative for malaise/fatigue and weight loss.  Eyes:  Negative for blurred vision.  Respiratory:  Negative for shortness of breath.   Cardiovascular:  Negative for chest pain.  Gastrointestinal:  Negative for vomiting.  Neurological:  Positive for headaches. Negative for dizziness and weakness.      Objective:     BP 104/66 (BP Location: Left Arm, Patient Position: Sitting, Cuff Size: Normal)   Pulse (!) 111   Temp 97.7 F (36.5 C) (Oral)   Ht 5' (1.524 m)   Wt 82 lb 3.2 oz (37.3 kg)   LMP 12/27/2007 (Exact Date)   SpO2 95%   BMI 16.05 kg/m  BP Readings from Last 3 Encounters:  10/20/22 104/66  09/29/22 101/70  08/22/22 100/70   Wt Readings from Last 3  Encounters:  10/20/22 82 lb 3.2 oz (37.3 kg)  09/29/22 88 lb 9.6 oz (40.2 kg)  08/22/22 88 lb (39.9 kg)      Physical Exam Vitals reviewed.  Constitutional:      Appearance: She is well-developed.  Eyes:     Pupils: Pupils are equal, round, and reactive to light.  Neck:     Thyroid: No thyromegaly.     Vascular: No JVD.  Cardiovascular:     Rate and Rhythm: Normal rate and regular rhythm.     Heart sounds:     No gallop.  Pulmonary:     Effort: Pulmonary effort is normal. No respiratory distress.     Breath sounds: Normal breath sounds. No wheezing or rales.  Musculoskeletal:     Cervical back: Neck supple.     Right lower leg: No edema.     Left lower leg: No  edema.  Neurological:     Mental Status: She is alert.      No results found for any visits on 10/20/22.    The 10-year ASCVD risk score (Arnett DK, et al., 2019) is: 6%    Assessment & Plan:   Elevated blood pressure by multiple recent home readings.  She has had consistent readings between 148 and 162 systolic and up to 100 diastolic.  Her blood pressures well-controlled here today but she did take both amlodipine and losartan yesterday.  -Continue close home monitoring of blood pressure and bring back cuff and her readings with next visit -Discussed nonpharmacologic management of elevated blood pressure and handout on DASH diet given.  She does stay quite active with exercise and no clear dietary triggers. -We agreed to go ahead and initiate valsartan 80 mg once daily and set up follow-up in a few weeks to reassess.  Check basic metabolic panel at follow-up.  She was taking combination of amlodipine and losartan at home which were her husband's medications but I am reluctant to start both medications today with her blood pressure being relatively low.  Stressed importance of adequate hydration.  [name], MD

## 2022-10-22 ENCOUNTER — Ambulatory Visit (INDEPENDENT_AMBULATORY_CARE_PROVIDER_SITE_OTHER): Payer: 59 | Admitting: Psychiatry

## 2022-10-22 ENCOUNTER — Ambulatory Visit: Payer: 59

## 2022-10-22 ENCOUNTER — Encounter: Payer: Self-pay | Admitting: Psychiatry

## 2022-10-22 VITALS — BP 115/74 | HR 63

## 2022-10-22 DIAGNOSIS — F900 Attention-deficit hyperactivity disorder, predominantly inattentive type: Secondary | ICD-10-CM

## 2022-10-22 DIAGNOSIS — F339 Major depressive disorder, recurrent, unspecified: Secondary | ICD-10-CM

## 2022-10-22 DIAGNOSIS — F419 Anxiety disorder, unspecified: Secondary | ICD-10-CM

## 2022-10-22 DIAGNOSIS — F411 Generalized anxiety disorder: Secondary | ICD-10-CM

## 2022-10-22 DIAGNOSIS — G4700 Insomnia, unspecified: Secondary | ICD-10-CM

## 2022-10-22 DIAGNOSIS — Z6379 Other stressful life events affecting family and household: Secondary | ICD-10-CM

## 2022-10-22 NOTE — Progress Notes (Signed)
NURSES NOTE:     Patient arrived for her #40 weekly Spravato treatment. Pt is being treated for Treatment Resistant Depression, the starting dose is 56 mg (2 of the 28 mg) nasal sprays. Pt will be receiving 84 mg (3 of the 28 mg) nasal sprays, this is her maintenance dose. Patient taken to treatment room, I explained and discussed her treatment and how the schedule should go, along with any side effects that may occur. Answered any questions and concerns the patient had. Pt's Spravato is delivered through [place] and she is now billing with her insurance. Spravato medication is stored at doctors office per REMS/FDA guidelines. The medication is required to be locked behind two doors per FDA/REMS Protocol. Medication is also disposed of properly per regulations. All treatments and her vital signs are documented in Spravato REMS per protocol of treatment center regulations.      Vital Signs assessed at 2:00 PM 117/73, pulse 73, pulse ox 92%. Instructed pt to blow her nose and recline back slightly to prevent any of medication dripping out of her nose.  Pt. Given 1st dose (28 mg inhaler), then 5 minutes between 2nd dose and 3rd dose. No complaints of nausea/vomiting reported. Pt did listen to music today, she just reclined back and closed her eyes. After 40 minutes I went to assess her vital signs, no side effects or symptoms reported, B/P 121/76, pulse 65. Informed pt could rest in the chair then I would walk her around to discuss her treatment with Dr. Blanchie Dessert. Nurse was with pt a total of 60 minutes but pt observed for 120 minutes per protocol. Discharge vital signs were at 3:54 PM, B/P 115/74, pulse 63. She reports dissociation but she was clear upon her discharge. She is scheduled on Monday, July 1st.  She is instructed to contact office if needing anything prior to her next treatment.     LOT # [id number] EXP APR 2027

## 2022-10-22 NOTE — Progress Notes (Unsigned)
Jasper Ruminski [id number] [birth year] 62 y.o.  Subjective:   Patient ID:  Natsha Guidry is a 62 y.o. (DOB [birth year]) female.  Chief Complaint:  Chief Complaint  Patient presents with   Follow-up   Depression   Anxiety   Fatigue   ADD   Sleeping Problem    HPI Sherry Blackard presents to the office today for follow-up of treatment resistant recurrent depression, generalized anxiety disorder and insomnia.  Almost too numerous to count med failures. She is here for Spravato administration for her treatment resistant depression.  05/21/22 appt noted: Patient was administered first dose Spravato 56 mg intranasally today.  The patient experienced the typical dissociation which gradually resolved over the 2-hour period of observation.  There were no complications.  Specifically the patient did not have nausea or vomiting or headache.   Dissociation was mild. Did not experience any emotional relief from disabling depression.wants to increase Spravato to the usual dose.  She is aware insurance is not covering the costs at this time.  No SI acutely. Tolerating meds.    05/23/22 appt noted: Patient was administered Spravato 84 mg intranasally today.  The patient experienced the typical dissociation which gradually resolved over the 2-hour period of observation.  There were no complications.  Specifically the patient did not have nausea or vomiting.   Dissociation was greater than last dose and a little bothersome DT some HA. Tolerating meds.   Mood has not changed significantly thus far with Spravato.  05/27/22 appt noted: Patient was administered Spravato 84 mg intranasally today.  The patient experienced the typical dissociation which gradually resolved over the 2-hour period of observation.  There were no complications.  Specifically the patient did not have nausea or vomiting or headache. Today she has felt a little relief from the pressing heaviness of depression but a little more anxiety.   During administration of Spravato she listens to [place] music and was able to relax a little more with the feeling of dissociation that was mildly bothersome. Husband was also in on the session today and asked some questions about IV ketamine versus nasal spray ketamine.  05/29/22 appt noted: Cancelled today DT hypertension  05/30/22 appt noted: Patient was administered Spravato 84 mg intranasally today.  The patient experienced the typical dissociation which gradually resolved over the 2-hour period of observation.  There were no complications.  Specifically the patient did not have nausea or vomiting or headache. She is seeing some improvement in mood with less continuous depression and less intensity.  Hopefulness is better.   No med complaints or SE  06/05/22 appt noted: Patient was administered Spravato 84 mg intranasally today.  The patient experienced the typical dissociation which gradually resolved over the 2-hour period of observation.  There were no complications.  Specifically the patient did not have nausea or vomiting or headache.  Specifically HA is less of a problem with Spravato. No SE with meds. Depression is improving with less intensity.  Intermittent anxiety easily.  More able to enjoy things.  No new concerns.  06/17/22 appt noted: Patient was administered Spravato 84 mg intranasally today.  The patient experienced the typical dissociation which gradually resolved over the 2-hour period of observation.  There were no complications.  Specifically the patient did not have nausea or vomiting or headache.  Specifically HA is less of a problem with Spravato. No SE with meds. Depression is improved 45% with less intensity.  Intermittent anxiety less easily.  More able to enjoy things.  No new  concerns.  More active. Current meds: increased clonazepam to about 1.5 mg HS DT recent insomnia, lexapro 20, Dayvigo 10 mg HS, concerta 36 mg AM, trazodone 100 mg HS.  06/20/22 appt  noted: Current meds: increased clonazepam to about 1.5 mg HS DT recent insomnia, lexapro 20, Dayvigo 10 mg HS, concerta 36 mg AM, trazodone 100 mg HS. Patient was administered Spravato 84 mg intranasally today.  The patient experienced the typical dissociation which gradually resolved over the 2-hour period of observation.  There were no complications.  Specifically the patient did not have nausea or vomiting or headache.  Specifically HA is less of a problem with Spravato. No SE with meds. Depression is improved 45% with less intensity.  Intermittent anxiety less easily.  More able to enjoy things.  No new concerns.  More active.  06/23/22 appt noted:  Current meds: increased clonazepam to about 1.5 mg HS DT recent insomnia, lexapro 20, Dayvigo 10 mg HS, concerta 36 mg AM, trazodone 100 mg HS. Patient was administered Spravato 84 mg intranasally today.  The patient experienced the typical dissociation which gradually resolved over the 2-hour period of observation.  There were no complications.  Specifically the patient did not have nausea or vomiting or headache.  Specifically HA is less of a problem with Spravato. No SE with meds. Intensity of Spravato dissociation varies.  Last week very intesne and this week more typical.  Depression is continuing to improve with better interest and activity and motivation.  Gradual improvement.  Wants to continue.  06/25/22 appt noted: Current meds: increased clonazepam to about 1.5 mg HS DT recent insomnia, lexapro 20, Dayvigo 10 mg HS, concerta 36 mg AM, trazodone 100 mg HS. Patient was administered Spravato 84 mg intranasally today.  The patient experienced the typical dissociation which gradually resolved over the 2-hour period of observation.  There were no complications.  Specifically the patient did not have nausea or vomiting or headache.  Specifically HA is less of a problem with Spravato. No SE with meds. Intensity of Spravato dissociation varies.  No  scary and well tolerated. Continues to gradually improve with Spravato.  She is more motivated and enjoying things more.  H sees progress.  Wants to continue twice weekly until gets maximum response.  Dep is not gone yet.  06/30/22 appt noted:  seen with H Current meds: increased clonazepam to about 1.5 mg HS DT recent insomnia, lexapro 20, Dayvigo 10 mg HS, concerta 36 mg AM, trazodone 100 mg HS. Patient was administered Spravato 84 mg intranasally today.  The patient experienced the typical dissociation which gradually resolved over the 2-hour period of observation.  There were no complications.  Specifically the patient did not have nausea or vomiting or headache.  Specifically HA is less of a problem with Spravato. No SE with meds. Intensity of Spravato dissociation varies.  No scary and well tolerated. Continues to gradually improve with Spravato.  She is more motivated and enjoying things more.  H sees even more progress than she does; more active and smiling.  Wants to continue twice weekly until gets maximum response.  Dep is 80% better.  07/07/22 appt noted: Current meds: increased clonazepam to about 1.5 mg HS DT recent insomnia, lexapro 20, Dayvigo 10 mg HS, concerta 36 mg AM, trazodone 100 mg HS. Patient was administered Spravato 84 mg intranasally today.  The patient experienced the typical dissociation which gradually resolved over the 2-hour period of observation.  There were no complications.  Specifically the patient  did not have nausea or vomiting or headache.  Specifically HA is less of a problem with Spravato. No SE with meds. Intensity of Spravato dissociation varies.  No scary and well tolerated.  Was more intese today. Overall mood is markedly better and please with meds. No changes desired.  07/09/22 appt noted: In transition from Lexapro to sertraline and missing Concerta bc CO crash from it after about 4 hours.  Disc these concerns.  She'll discuss also with Corie Chiquito,  NP More dep the last couple of days and concerned about reducing Spravato frequency while transition from one antidep to another.  Affect less positive. Patient was administered Spravato 84 mg intranasally today.  The patient experienced the typical dissociation which gradually resolved over the 2-hour period of observation.  There were no complications.  Specifically the patient did not have nausea or vomiting or headache.  Specifically HA is less of a problem with Spravato. No SE with meds. Intensity of Spravato dissociation varies.  No scary and well tolerated.   07/14/22 appt noted: Has been more depressed this week.  More trouble with sleep.  Taking clonazepam 1-1.5  of the 0.5 mg tablets.   Trazodone not helping much. She switched back from 200 sertraline to Lexapro 20.  No SE More trouble with motivation.  Some stress with son with special needs. Misses the benevit of Concerta but it was too short acting and crashed in 4-6 hours. Patient was administered Spravato 84 mg intranasally today.  The patient experienced the typical dissociation which gradually resolved over the 2-hour period of observation.  There were no complications.  Specifically the patient did not have nausea or vomiting or headache.  Specifically HA is less of a problem with Spravato. No SE with meds. Intensity of Spravato dissociation varies.  No scary and well tolerated.   07/17/22 appt noted: Patient was administered Spravato 84 mg intranasally today.  The patient experienced the typical dissociation which gradually resolved over the 2-hour period of observation.  There were no complications.  Specifically the patient did not have nausea or vomiting or headache.  Specifically HA is less of a problem with Spravato. No SE with meds. Intensity of Spravato dissociation varies.  Not scary and well tolerated.  Just got Jornay last night but wasn't sure how to take it.  Wants to start and this was discussed.  She felt MPH helped  mood and attn but didn't last long enough and had crash after a few hours on Concerta.  Better for 2 hours after each dose Ritalin 20 mg with mood but then it wore off.  BP up after 3rd dose 160/90 and then took H's amlodipine 5 and BP became normal.  07/22/22 appt noted: Patient was administered Spravato 84 mg intranasally today.  The patient experienced the typical dissociation which gradually resolved over the 2-hour period of observation.  There were no complications.  Specifically the patient did not have nausea or vomiting or headache.  Specifically HA is less of a problem with Spravato. No SE with meds. Intensity of Spravato dissociation varies.  Not scary and well tolerated.  Start Jornay 20 mg over the weekend and did not notice any particular effect good or bad.  Increased to 40 mg. Other psych meds: Clonazepam 0.5 mg tablets 2 nightly, gabapentin dosage varies, Dayvigo 10 mg nightly, Lexapro 20 mg daily, to trazodone 100 mg nightly  07/24/22 appt noted: Patient was administered Spravato 84 mg intranasally today.  The patient experienced the typical dissociation  which gradually resolved over the 2-hour period of observation.  There were no complications.  Specifically the patient did not have nausea or vomiting or headache.  Specifically HA is less of a problem with Spravato. No SE with meds. Intensity of Spravato dissociation varies.  Not scary and well tolerated.  Other psych meds: Clonazepam 0.5 mg tablets 2 nightly, gabapentin dosage varies, Dayvigo 10 mg nightly, Lexapro 20 mg daily, to trazodone 100 mg nightly, Jornay 40 mg PM Doing well with Spravato.  Noticed a little effect from the Jornay 40 without SE but would like to increase the dose for ADD and mood.  Sleeping well.  No new concerns.  Still more depressed than she was with th initial benefit of Spravato.  07/30/22 note:  Patient was administered Spravato 84 mg intranasally today.  The patient experienced the typical  dissociation which gradually resolved over the 2-hour period of observation.  There were no complications.  Specifically the patient did not have nausea or vomiting or headache.  Specifically HA is less of a problem with Spravato. Other psych meds: Clonazepam 0.5 mg tablets 2 nightly, gabapentin dosage varies, Dayvigo 10 mg nightly, Lexapro 20 mg daily, to trazodone 100 mg nightly, Jornay 60 mg PM Wants to increase Jornay to 80 mg PM to increase benefit for ADD and depression. No SE She has experienced a major family crisis that threatens to change her daily life from now on.  It has not triggered suicidal thoughts at this time but she is very afraid.  No desire to change medications.  08/04/22 appt noted" Patient was administered Spravato 84 mg intranasally today.  The patient experienced the typical dissociation which gradually resolved over the 2-hour period of observation.  There were no complications.  Specifically the patient did not have nausea or vomiting or headache.  Specifically HA is less of a problem with Spravato. Other psych meds: Clonazepam 0.5 mg tablets 2 nightly, gabapentin dosage varies, Dayvigo 10 mg nightly, Lexapro 20 mg daily, trazodone 100 mg nightly, Jornay 80 mg PM No SE No benefit or SE with increase Jormay 80 mg.  Says Concerta helped ADD and depression but not Jornay.  However duration was insufficient.  Still depressed and wants change to another form of stimulant.  No concerns with other meds. Continues with strong family stressors creating uncertainty about her future but no quick resolution. No SI Trial Cotempla 17.3 and DC Ritalin & Jornay 80  08/11/2022 appointment noted: Patient was administered Spravato 84 mg intranasally today.  The patient experienced the typical dissociation which gradually resolved over the 2-hour period of observation.  There were no complications.  Specifically the patient did not have nausea or vomiting or headache.  Specifically HA is less  of a problem with Spravato now vs when started it.. Other psych meds: Clonazepam 0.5 mg tablets 2 nightly, gabapentin dosage varies, Dayvigo 10 mg nightly, trazodone 100 mg nightly, cotempla 17.3, switch from Lexapro 20 to sertaline 15o mg . No noticeable effects sertraline from for depression but has seen some antianxiety effects from the sertraline.  No side effects noted.  No side effects with the other medicines.  Still is only getting very brief benefit 3 to 4 hours from Cotempla for ADD and mood.  She wants to try the Daytrana patch as we have discussed before. No SE  08/13/22 appt: Patient was administered Spravato 84 mg intranasally today.  The patient experienced the typical dissociation which gradually resolved over the 2-hour period of observation.  There were no complications.  Specifically the patient did not have nausea or vomiting or headache.  Specifically HA is less of a problem with Spravato now vs when started it.. Other psych meds: Clonazepam 0.5 mg tablets 2 nightly, gabapentin dosage varies, Dayvigo 10 mg nightly, trazodone 100 mg nightly, cotempla 17.3, switch from Lexapro 20 to sertaline 15o mg . No noticeable effects sertraline from for depression but has seen some antianxiety effects from the sertraline.  No side effects noted.  No side effects with the other medicines.  Still is only getting very brief benefit 3 to 4 hours from Cotempla for ADD and mood.  She wants to try the Daytrana patch as we have discussed before.  Hasn't been able to get it yet. No SE Plan.  Continue to try to get Daytrana 30 AM  08/18/22 appt noted: Patient was administered Spravato 84 mg intranasally today.  The patient experienced the typical dissociation which gradually resolved over the 2-hour period of observation.  There were no complications.  Specifically the patient did not have nausea or vomiting or headache.  Specifically HA is less of a problem with Spravato now vs when started it.. Other  psych meds: Clonazepam 0.5 mg tablets 2 nightly, gabapentin dosage varies, Dayvigo 10 mg nightly, trazodone 100 mg nightly, cotempla 17.3, switch from Lexapro 20 to sertaline 15o mg . No noticeable effects sertraline from for depression but has seen some antianxiety effects from the sertraline.  No side effects noted.  No side effects with the other medicines.  Still is only getting very brief benefit 3 to 4 hours from Cotempla for ADD and mood.  She wants to try the Daytrana patch as we have discussed before.  Hasn't been able to get it yet.  Overall depression is some better than it was.   No SE Plan.  Continue to try to get Daytrana 30 AM  08/25/22 appt : No benefit Daytrana.  No SE.  Wants further med changes and interested In retrying pramipexole.   Tolerating meds. Patient was administered Spravato 84 mg intranasally today.  The patient experienced the typical dissociation which gradually resolved over the 2-hour period of observation.  There were no complications.  Specifically the patient did not have nausea or vomiting or headache.  Specifically HA is less of a problem with Spravato now vs when started it.. Other psych meds: Clonazepam 0.5 mg tablets 2 nightly, gabapentin dosage varies, Dayvigo 10 mg nightly, trazodone 100 mg nightly, , switch from Lexapro 20 to sertaline 15o mg . Daytrana 30 AM for a few days. Struggles with depression and no SI.  Reduced interest and mood and motivation and enjoyment and socialization.  08/27/22 appt noted: Psych meds:  Clonazepam 0.5 mg tablets 2 nightly, gabapentin dosage varies, Dayvigo 10 mg nightly, trazodone 100 mg nightly,  sertaline 15o mg . Returned to [employer] AM, pramipexole  Tolerating meds. Patient was administered Spravato 84 mg intranasally today.  The patient experienced the typical dissociation which gradually resolved over the 2-hour period of observation.  There were no complications.  Specifically the patient did not have nausea or vomiting  or headache.   Reports taking cotempla bc brief mood benefit and sig focus and productivity benefit. Took 1 dose pramipexole and felt more dep.  09/01/22 appt noted: Psych meds:  Clonazepam 0.5 mg tablets 2 nightly, gabapentin dosage varies, Dayvigo 10 mg nightly, trazodone 100 mg nightly,  sertaline 15o mg . Returned to Cotempla AM, pramipexole  0.25 mg BID. Tolerating  meds now. Patient was administered Spravato 84 mg intranasally today.  The patient experienced the typical dissociation which gradually resolved over the 2-hour period of observation.  There were no complications.  Specifically the patient did not have nausea or vomiting or headache.   Willing to try the pramipexole and will continue 0.25 mg BID this week and then consider increase if tolerated.  Mood is a little better.  Still looking for further improvement.  09/03/22 appt noted: Psych meds:  Clonazepam 0.5 mg tablets 2 nightly, gabapentin dosage varies, Dayvigo 10 mg nightly, trazodone 100 mg nightly,  sertaline 15o mg . Returned to Cotempla AM, pramipexole  0.25 mg tablet 1 and 1/2 tablets twice daily BID. Tolerating meds now. Patient was administered Spravato 84 mg intranasally today.  The patient experienced the typical dissociation which gradually resolved over the 2-hour period of observation.  There were no complications.  Specifically the patient did not have nausea or vomiting or headache.   Depression may be a little better with the parmipexole and is tolerating it well so far.  Still struggling with focus and residual depression.  Tolerating meds without SE.  More hopeful and able to enjoy some things.  09/08/22 appt  Psych meds:  Clonazepam 0.5 mg tablets 2 nightly, gabapentin dosage varies, Dayvigo 10 mg nightly, trazodone 100 mg nightly,  sertaline 15o mg . Returned to Cotempla AM, pramipexole  0.5 mg BID. Tolerating meds now. Patient was administered Spravato 84 mg intranasally today.  The patient experienced the  typical dissociation which gradually resolved over the 2-hour period of observation.  There were no complications.  Specifically the patient did not have nausea or vomiting or headache.   She has seen her depression drop from a 10/10 prior to treatment with Spravato to now 4/10 With additional benefit noted when she increased the pramipexole to 0.5 mg twice daily.  She is not having side effects with this like she did in the past. Anxiety levels are also improved. She is sleeping okay with the current medications. Plan: continue pramipexole trial off-label and increase more slowly for TRD.  Increase  Gradually  to 0.75 mg BID  09/11/22 appt noted: Psych meds:  Clonazepam 0.5 mg tablets 2 nightly, gabapentin dosage varies, Dayvigo 10 mg nightly, trazodone 100 mg nightly,  sertaline 15o mg . Returned to Cotempla AM, pramipexole  0.75 mg BID. Tolerating meds now. Patient was administered Spravato 84 mg intranasally today.  The patient experienced the typical dissociation which gradually resolved over the 2-hour period of observation.  There were no complications.  Specifically the patient did not have nausea or vomiting or headache.   She has seen her depression drop from a 10/10 prior to treatment with Spravato to now 4/10 With additional benefit noted when she increased the pramipexole to 0.5 mg twice daily.  She is not having side effects with this like she did in the past. Clearly improving with the pramipexole now and tolerating it.  Satisfied with current meds but would consider increasing if it might be helpful.  09/15/22 appt noted: Psych meds:  Clonazepam 0.5 mg tablets 2 nightly, gabapentin dosage varies, Dayvigo 10 mg nightly, trazodone 100 mg nightly,  sertaline 15o mg . Returned to Cotempla AM, pramipexole  0.75 mg BID too anxious and reduced to 0.5 mg BID. Tolerating meds now. Patient was administered Spravato 84 mg intranasally today.  The patient experienced the typical dissociation  which gradually resolved over the 2-hour period of observation.  There were no  complications.  Specifically the patient did not have nausea or vomiting or headache.   Trouble staying asleep longer than 6 hours.  Doesn't feel she can tolerate pramipexole 0.75 mg BID DT anxiety not experienced at  0.5 mg BID.  Otherwise tolerating meds.  Mood is better with Spravato and pramipexole.   Not sleeping as long with meds.  EMA 6 hours.  Wonders about change Disc concerns about waiting for therapy. Feels ready to reduce Spravato to weekly.    09/25/22 appt noted: Psych meds:  Clonazepam 0.5 mg tablets 2 nightly, gabapentin dosage varies, Dayvigo 10 mg nightly, trazodone 100 mg nightly,  sertaline 15o mg . Returned to Cotempla AM, pramipexole  0.75 mg BID too anxious and reduced to 0.5 mg BID. Tolerating meds now. Patient was administered Spravato 84 mg intranasally today.  The patient experienced the typical dissociation which gradually resolved over the 2-hour period of observation.  There were no complications.  Specifically the patient did not have nausea or vomiting or headache.   Trouble staying asleep longer than 6 hours.  Failed to respond to Seroquel 25 mg HS.  Gone back to [facility].  Belsomra NR. Mood ok until the last few days more dep bc it's been 10 days since last admin.  Wants to continue weekly.   No SE.  No med changes desired.  09/29/22 appt noted: Psych meds:  Clonazepam 0.5 mg tablets 2 nightly, gabapentin dosage varies, Dayvigo 10 mg nightly, trazodone 100 mg nightly,  sertaline 15o mg . Returned to Cotempla AM, pramipexole  0.75 mg BID too anxious and reduced to 0.5 mg BID. Tolerating meds now. Patient was administered Spravato 84 mg intranasally today.  The patient experienced the typical dissociation which gradually resolved over the 2-hour period of observation.  There were no complications.  Specifically the patient did not have nausea or vomiting or headache.   Trouble staying asleep  longer than 6 hours.  Plan: Continue sertaline to 200 mg daily.  It has helped anxiety but not depression.   for insomnia, clonazepam 1 mg HS. trazodone doesn't work well but helps some Continue Dayvigo 10 HS.  Failed resp Belsomra 15 and seroquel 25 She wants to continue Cotempla or Concerta bc helps mood early in day and focus  benefit lasts longer.  Have not been able to get stimulant to be consitently effective throughout the day. continue pramipexole trial off-label and reduce back to  TRD 0.5 mg BID Spravato weekly.  10/13/22 appt noted: Not sleeping as well.  Wants to increase Spravato to twice weekly bc feeling more dep close to the next sched date.  Just the day or 2 before.  .  Plan: Trial for off label TRD increase pramipexole 1 mg TID  10/20/22 appt noted: Psych meds:  Clonazepam 0.5 mg tablets 2 nightly, gabapentin dosage varies, Dayvigo 10 mg nightly, trazodone 100 mg nightly,  sertaline 15o mg . Returned to Cotempla AM, pramipexole  1 mg TID. Tolerating meds now. Patient was administered Spravato 84 mg intranasally today.  The patient experienced the typical dissociation which gradually resolved over the 2-hour period of observation.  There were no complications.  Specifically the patient did not have nausea or vomiting or headache.   Trouble staying asleep longer than 6 hours.  More dep this week and asked to get Spravato twice this week. Plan: Trial for off label TRD increase pramipexole 1 mg QID  10/22/22 appt noted:  Psych meds:  Clonazepam 0.5 mg tablets 2 nightly and  another one with EMA, gabapentin dosage varies, Belsomra 20 mg daily,  trazodone 100 mg nightly,  sertaline 15o mg . Returned to Cotempla AM, pramipexole  1 mg QID for 2nd day Tolerating meds now.  Except a little N and dizzy briefly after takes it. Patient was administered Spravato 84 mg intranasally today.  The patient experienced the typical dissociation which gradually resolved over the 2-hour period of  observation.  There were no complications.  Specifically the patient did not have nausea or vomiting or headache. No change in mood yet.   Sleep better with Belsomra 20 as long as takes with other meds.  If insomnia mood is worse. Ongoing some dep but gets partial relief with Spravato but would like better relief .  AIMS    Flowsheet Row Video Visit from 01/03/2022 in [facility]  AIMS Total Score 0      ECT-MADRS    Flowsheet Row Clinical Support from 08/13/2022 in [facility] Video Visit from 04/29/2022 in [facility] Video Visit from 02/06/2022 in [facility]  MADRS Total Score 27 44 23      GAD-7    Flowsheet Row Office Visit from 08/22/2022 in [facility] [place]  Total GAD-7 Score 7      PHQ2-9    Flowsheet Row Office Visit from 08/22/2022 in [facility] [place] Office Visit from 08/19/2022 in [facility] Video Visit from 02/06/2022 in [facility] Office Visit from 11/16/2020 in [facility] for [facility] [place] Office Visit from 04/19/2018 in [facility] [place]  PHQ-2 Total Score 3 0 6 0 0  PHQ-9 Total Score 5 -- 14 -- 2        Past Psychiatric Medication Trials: Trintellix - Initially effective and then was not effective when re-started Paxil- Ineffective. Helped initially. Prozac-Insomnia Lexapro- Effective and then no longer as effective Zoloft- Initially effective and then not as effective.  Viibryd Cymbalta- Ineffective. Helped initially. Effexor XR- Effective, well tolerated. Pristiq- Increased anxiety at 150 mg daily Wellbutrin XL-Ineffective.May have increased anxiety. Amitriptyline Nortriptyline-Caused irritability, constipation Auvelity- ineffective  Abilify-  Helpful but caused severe insomnia. Rexulti-Helpful but caused insomnia. Vraylar Seroquel - Ineffective Risperdal Olanzapine- Ineffective Latuda- Ineffective Caplyta  Klonopin- Effective Temazepam- Took during menopause Lunesta- Ineffective Ambien-Ineffective Sonata- Ineffective Dayvigo- partially effective  Failed resp Belsomra 15 and seroquel 25 Trazodone-  somewhat effective Remeron-Ineffective. Does not recall wt gain.  Quetiapine 25 NR  Adderall- Took once and had adverse effect. Concerta- Effective but only 4-6 hours Jornay 80 NR Metadate-Not as effective as Concerta Dexmethylphenidate- Not as effective as Concerta Azstarys- some side effects. Not as effective.  Ritalin short acting and SE anxiety & HTN @20  TID Cotemplay 17.3  Daytrana  Lamictal- May have had an adverse effects. "Felt weird." Reports worsening s/s.  Lithium Gabapentin Pramipexole- "felt really weird."   ECT #20 with minimal response. H says it was partly helpful.  AIMS    Flowsheet Row Video Visit from 01/03/2022 in [facility]  AIMS Total Score 0      ECT-MADRS    Flowsheet Row Clinical Support from 08/13/2022 in [facility] Video Visit from 04/29/2022 in [facility] Video Visit from 02/06/2022 in [facility]  MADRS Total Score 27 44 23  GAD-7    Flowsheet Row Office Visit from 08/22/2022 in [facility] [place]  Total GAD-7 Score 7      PHQ2-9    Flowsheet Row Office Visit from 08/22/2022 in [facility] [place] Office Visit from 08/19/2022 in [facility] for [facility] Video Visit from 02/06/2022 in [facility] Office Visit from 11/16/2020 in [facility] for [facility] [place] Office Visit from 04/19/2018 in [facility]  HealthCare at [facility] Total Score 3 0 6 0 0  PHQ-9 Total Score 5 -- 14 -- 2        Review of Systems:  Review of Systems  Neurological:  Negative for dizziness and tremors.  Psychiatric/Behavioral:  Positive for dysphoric mood and sleep disturbance. Negative for decreased concentration. The patient is nervous/anxious.     Medications: I have reviewed the patient's current medications.  Current Outpatient Medications  Medication Sig Dispense Refill   clonazePAM (KLONOPIN) 0.5 MG tablet 2 tablets at night and 1 tablet as needed daily for panic anxiety 75 tablet 1   Esketamine HCl, 84 MG Dose, (SPRAVATO, 84 MG DOSE,) 28 MG/DEVICE SOPK USE 3 SPRAYS IN EACH NOSTRIL EVERY 3 DAYS 3 each 1   Estradiol (VAGIFEM) 10 MCG TABS vaginal tablet Place 1 tablet (10 mcg total) vaginally 2 (two) times a week. 24 tablet 3   MAGNESIUM PO Take by mouth.     Methylphenidate (COTEMPLA XR-ODT) 17.3 MG TBED Take 1 tablet (17.3 mg total) by mouth every morning. 30 tablet 0   Multiple Vitamin (MULTIVITAMIN) tablet Take 1 tablet by mouth daily.     pramipexole (MIRAPEX) 1 MG tablet Take 1 tablet (1 mg total) by mouth in the morning, at noon, in the evening, and at bedtime. 120 tablet 0   sertraline (ZOLOFT) 100 MG tablet Take 2 tablets (200 mg total) by mouth daily. 180 tablet 0   Suvorexant (BELSOMRA) 20 MG TABS Take 1 tablet (20 mg total) by mouth at bedtime. 30 tablet 0   valACYclovir (VALTREX) 500 MG tablet Take one twice daily for 3 days with any symptoms 30 tablet 3   valsartan (DIOVAN) 80 MG tablet Take 1 tablet (80 mg total) by mouth daily. 90 tablet 3   No current facility-administered medications for this visit.    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   TRANSAMINASES, SERUM, ELEVATED 08/14/2009    Past Medical History, Surgical history, Social history, and Family history were reviewed and updated as appropriate.   Please see review of systems for further details on the patient's review from today.   Objective:   Physical Exam:  LMP 12/27/2007 (Exact Date)   Physical Exam Constitutional:      General: She is not in acute distress. Musculoskeletal:        General: No deformity.  Neurological:     Mental Status: She is alert and oriented to person, place, and time.  Coordination: Coordination normal.  Psychiatric:        Attention and Perception: Attention and perception normal. She does not perceive auditory hallucinations.        Mood and Affect: Mood is anxious and depressed. Affect is not labile or blunt.        Speech: Speech normal. Speech is not slurred.        Behavior: Behavior normal.        Thought Content: Thought content normal. Thought content is not delusional. Thought content does not include homicidal or suicidal ideation. Thought content does not include suicidal plan.        Cognition and Memory: Cognition and memory normal.        Judgment: Judgment normal.     Comments: Insight intact Moodmore dep lately Affect blunted mildly     Lab Review:     Component Value Date/Time   NA 138 04/22/2022 0932   K 4.1 04/22/2022 0932   CL 98 04/22/2022 0932   CO2 31 04/22/2022 0932   GLUCOSE 102 (H) 04/22/2022 0932   BUN 10 04/22/2022 0932   CREATININE 0.71 04/22/2022 0932   CREATININE 0.68 01/26/2020 0920   CALCIUM 10.5 04/22/2022 0932   PROT 8.3 04/22/2022 0932   ALBUMIN 5.3 (H) 04/22/2022 0932   AST 82 (H) 04/22/2022 0932   ALT 118 (H) 04/22/2022 0932   ALKPHOS 74 04/22/2022 0932   BILITOT 0.3 04/22/2022 0932   GFRNONAA >90 05/01/2014 1645   GFRAA >90  05/01/2014 1645       Component Value Date/Time   WBC 3.3 (L) 04/22/2022 0932   RBC 4.25 04/22/2022 0932   HGB 14.3 04/22/2022 0932   HGB 14.1 10/26/2014 1034   HCT 40.7 04/22/2022 0932   PLT 245.0 04/22/2022 0932   MCV 95.7 04/22/2022 0932   MCH 34.4 (H) 01/26/2020 0920   MCHC 35.1 04/22/2022 0932   RDW 13.2 04/22/2022 0932   LYMPHSABS 0.9 04/22/2022 0932   MONOABS 0.2 04/22/2022 0932   EOSABS 0.1 04/22/2022 0932   BASOSABS 0.0 04/22/2022 0932    No results found for: "POCLITH", "LITHIUM"   No results found for: "PHENYTOIN", "PHENOBARB", "VALPROATE", "CBMZ"   .res Assessment: Plan:    Samanatha was seen today for follow-up, depression, anxiety, fatigue, add and sleeping problem.  Diagnoses and all orders for this visit:  Recurrent major depression resistant to treatment (HCC)  Generalized anxiety disorder  Attention deficit hyperactivity disorder (ADHD), predominantly inattentive type  Insomnia, unspecified type  Anxiety disorder, unspecified type  Stressful life event affecting family  Anxiety   Pt seen for TRD and administration of Spravato.  Patient was administered Spravato 84 mg intranasally today.  The patient experienced the typical dissociation which gradually resolved over the 2-hour period of observation.  There were no complications.  Specifically the patient did not have nausea or vomiting. But mild headache.  Blood pressures remained within normal ranges at the 40-minute and 2-hour follow-up intervals.  By the time the 2-hour observation period was met the patient was alert and oriented and able to exit without assistance.  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  See nursing note for further details.Tolerated the Spravato to 84mg  . Jcontinue  Spravato weekly vs increase to twice weekly.  She feels she needs to continue the higher frequency.    Consider switch  Spravato to MAOI  We discussed the short-term risks associated with benzodiazepines including  sedation and increased fall risk among others.  Discussed long-term side effect risk including dependence, potential withdrawal symptoms, and the potential eventual dose-related risk of dementia.  But recent studies from 2020 dispute this association between benzodiazepines and dementia risk. Newer studies in 2020 do not support an association with dementia.  Continue sertraline to 200 mg daily.  It has helped anxiety but not depression.    for insomnia, clonazepam 1 mg HS.  trazodone doesn't work well but helps some Stopped Dayvigo 10 HS.   Failed resp Belsomra 15 and seroquel 25 Some better so far with Belsomra 20 mg HS  She wants to continue Cotempla or Concerta bc helps mood early in day and focus  benefit lasts longer.  Have not been able to get stimulant to be consitently effective throughout the day.  Trial for off label TRD increase pramipexole 1 mg QID If no benefit after 2 weeks then max to 5 mg daily. She is tolerating this without problems so far.  Disc SE incl risk impulsivity , mania, etc.  Clearly better with it and then seemed to lose effect.  No SE at these dosage.  Crisis intervention around the events that have occurred in her family that may change her life from this point forward.  She is managing with expected emotional distress but not suicidal thought.    FU weekly Spravato .  Meredith Staggers, MD, DFAPA    Please see After Visit Summary for patient specific instructions.  Future Appointments  Date Time Provider Department Center  10/27/2022  2:00 PM CP-NURSE [facility] None  10/27/2022  2:00 PM Joan Flores, NP [facility] None  11/03/2022  2:00 PM Cottle, Steva Ready., MD [facility] None  11/03/2022  2:00 PM CP-NURSE [facility] None  11/10/2022  2:00 PM Cottle, Steva Ready., MD [facility] None  11/10/2022  2:00 PM CP-NURSE [facility] None  11/19/2022  7:30 AM Burchette, Elberta Fortis, MD [facility]  09/18/2023   8:15 AM Jerene Bears, MD [facility]    No orders of the defined types were placed in this encounter.   ------------------------------- [id number]

## 2022-10-23 ENCOUNTER — Encounter: Payer: Self-pay | Admitting: Family Medicine

## 2022-10-24 ENCOUNTER — Telehealth: Payer: Self-pay

## 2022-10-24 NOTE — Telephone Encounter (Signed)
Pt notified nurse that her B/P has been elevated without taking the stimulant. She has been checking it at home and its ranging from 160/100 to 140/82. Pt's PCP did put her on B/P medicine but still running high for her.  We did discuss Spravato can increase blood pressure but usually lasts up to 4 hours after treatment. Informed her Dr. Jennelle Human is out of town but I would update him with information. She does have an apt with her PCP on 10/27/2022 then has Spravato in the afternoon.

## 2022-10-26 NOTE — Telephone Encounter (Signed)
Incr BP outside of officwe is unrelated to Spravato.  We will not be managing her BP.  See PCP

## 2022-10-27 ENCOUNTER — Ambulatory Visit: Payer: 59 | Admitting: Behavioral Health

## 2022-10-27 ENCOUNTER — Encounter: Payer: Self-pay | Admitting: Family Medicine

## 2022-10-27 ENCOUNTER — Ambulatory Visit: Payer: 59

## 2022-10-27 ENCOUNTER — Ambulatory Visit (INDEPENDENT_AMBULATORY_CARE_PROVIDER_SITE_OTHER): Payer: Managed Care, Other (non HMO) | Admitting: Family Medicine

## 2022-10-27 VITALS — BP 123/78 | HR 85

## 2022-10-27 VITALS — BP 104/60 | HR 117 | Temp 98.1°F | Ht 60.0 in | Wt 84.0 lb

## 2022-10-27 DIAGNOSIS — F339 Major depressive disorder, recurrent, unspecified: Secondary | ICD-10-CM

## 2022-10-27 DIAGNOSIS — I10 Essential (primary) hypertension: Secondary | ICD-10-CM | POA: Diagnosis not present

## 2022-10-27 LAB — BASIC METABOLIC PANEL
BUN: 14 mg/dL (ref 6–23)
CO2: 30 mEq/L (ref 19–32)
Calcium: 10.1 mg/dL (ref 8.4–10.5)
Chloride: 95 mEq/L — ABNORMAL LOW (ref 96–112)
Creatinine, Ser: 0.67 mg/dL (ref 0.40–1.20)
GFR: 94.92 mL/min (ref >60.00)
Glucose, Bld: 89 mg/dL (ref 70–99)
Potassium: 4.4 mEq/L (ref 3.5–5.1)
Sodium: 132 mEq/L — ABNORMAL LOW (ref 135–145)

## 2022-10-27 NOTE — Progress Notes (Signed)
Established Patient Office Visit  Subjective   Patient ID: Heather Davila, female    DOB: 07-13-1960  Age: 62 y.o. MRN: [id number]  Chief Complaint  Patient presents with   Medical Management of Chronic Issues    HPI   Heather Davila is seen to follow-up hypertension.  She actually had fairly favorable blood pressure reading last visit here but had several elevated home readings as high as 162/100.  She is a retired Engineer, civil (consulting) and has a manual cuff and checking home readings that way.  We started her last week on valsartan 80 mg once daily.  She remains on that dose at this time.  She had recently taken some of her husbands amlodipine 5 mg as well.  She is followed by psychiatry for depression and treated with several medications including Spravato which is a ketamine drug.  This can elevate blood pressure about usually this is very short-lived with very short half-life.  She does not use any alcohol.  No recent nonsteroidal use.  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   TRANSAMINASES, SERUM, ELEVATED 08/14/2009   Past Surgical History:  Procedure Laterality Date   AUGMENTATION MAMMAPLASTY Bilateral    BREAST ENHANCEMENT SURGERY Bilateral    BUNIONECTOMY     CESAREAN SECTION     X 3   FACELIFT  2022   HYSTEROSCOPY  12/27/2007   and HTA   UPPER GASTROINTESTINAL ENDOSCOPY  2020    reports that she quit smoking about 35 years ago. Her smoking use included cigarettes. She has a 2.50 pack-year smoking history. She has never used smokeless tobacco. She reports that she does not drink alcohol and does not use drugs. family history includes Cancer (age of onset: 5) in her mother; Colon cancer (age of onset: 26) in her mother; Colon polyps in her mother; Depression in her sister;  Diabetes in her paternal uncle; Heart attack (age of onset: 71) in her sister; Heart disease in her paternal grandfather and sister; Heart disease (age of onset: 80) in her father; Heart failure in her paternal aunt; Hypertension in her father and mother; Stroke (age of onset: 62) in her father. Allergies  Allergen Reactions   Ciprofloxacin     REACTION: hives   Oxycodone-Acetaminophen     REACTION: hives    Review of Systems  Constitutional:  Negative for malaise/fatigue.  Eyes:  Negative for blurred vision.  Respiratory:  Negative for shortness of breath.   Cardiovascular:  Negative for chest pain.  Neurological:  Negative for dizziness, weakness and headaches.      Objective:     BP 104/60 (BP Location: Left Arm, Patient Position: Sitting, Cuff Size: Normal)   Pulse (!) 117   Temp 98.1 F (36.7 C) (Oral)   Ht 5' (1.524 m)   Wt 84 lb (38.1 kg)   LMP 12/27/2007 (Exact Date)   SpO2 98%   BMI 16.41 kg/m  BP Readings from Last 3 Encounters:  10/27/22 104/60  10/20/22 104/66  09/29/22 101/70   Wt Readings from Last 3 Encounters:  10/27/22 84 lb (38.1 kg)  10/20/22 82 lb 3.2 oz (37.3 kg)  09/29/22 88 lb 9.6 oz (40.2 kg)      Physical Exam  Vitals reviewed.  Constitutional:      Appearance: She is well-developed.  Eyes:     Pupils: Pupils are equal, round, and reactive to light.  Neck:     Thyroid: No thyromegaly.     Vascular: No JVD.  Cardiovascular:     Rate and Rhythm: Normal rate and regular rhythm.     Heart sounds:     No gallop.  Pulmonary:     Effort: Pulmonary effort is normal. No respiratory distress.     Breath sounds: Normal breath sounds. No wheezing or rales.  Musculoskeletal:     Cervical back: Neck supple.     Right lower leg: No edema.     Left lower leg: No edema.  Neurological:     Mental Status: She is alert.      No results found for any visits on 10/27/22.    The 10-year ASCVD risk score (Arnett DK, et al., 2019) is: 7.9%     Assessment & Plan:   Problem List Items Addressed This Visit   None Visit Diagnoses     Essential hypertension    -  Primary   Relevant Orders   Basic metabolic panel     Hypertension improved.  She has excellent reading here today and has had improved readings at home over the past couple days.  Check basic metabolic panel with recent initiation of ARB.  Continue to monitor at home and be in touch if consistently greater than 140 systolic or 90 diastolic  No follow-ups on file.    [name], MD

## 2022-10-27 NOTE — Progress Notes (Signed)
NURSES NOTE:     Patient arrived for her #41 weekly Spravato treatment. Pt is being treated for Treatment Resistant Depression, the starting dose is 56 mg (2 of the 28 mg) nasal sprays. Pt will be receiving 84 mg (3 of the 28 mg) nasal sprays, this is her maintenance dose. Patient taken to treatment room, I explained and discussed her treatment and how the schedule should go, along with any side effects that may occur. Answered any questions and concerns the patient had. Pt's Spravato is delivered through [place] and she is now billing with her insurance. Spravato medication is stored at doctors office per REMS/FDA guidelines. The medication is required to be locked behind two doors per FDA/REMS Protocol. Medication is also disposed of properly per regulations. All treatments and her vital signs are documented in Spravato REMS per protocol of treatment center regulations.      Vital Signs assessed at 2:00 PM 101/68, pulse 69, pulse ox 92%. Instructed pt to blow her nose and recline back slightly to prevent any of medication dripping out of her nose.  Pt. Given 1st dose (28 mg inhaler), then 5 minutes between 2nd dose and 3rd dose. No complaints of nausea/vomiting reported. Pt did listen to music today, she just reclined back and closed her eyes. After 40 minutes I went to assess her vital signs, no side effects or symptoms reported, B/P 115/70, pulse 67. Avelina Laine, NP talked to pt since Dr. Jennelle Human out of office this week. Nurse was with pt a total of 60 minutes but pt observed for 120 minutes per protocol. Discharge vital signs were at 3:56 PM, B/P 123/78, pulse 85. She reports dissociation but she was clear upon her discharge. She is scheduled on Monday, July 8th.  She is instructed to contact office if needing anything prior to her next treatment.     LOT # [id number] EXP [id number]

## 2022-10-27 NOTE — Progress Notes (Signed)
Heather Davila [id number] [birth year] 62 y.o.  Subjective:   Patient ID:  Heather Davila is a 62 y.o. (DOB December 27, 1960) female.  Chief Complaint: No chief complaint on file.   HPI Heather Davila presents to the office today for follow-up of treatment resistant depression (TRD).   Spravato treatment    Patient was administered Spravato 84 mg intranasally today. Patient was observed by provider throughout [facility] treatment. The patient experienced the typical dissociation which gradually resolved over the 2-hour period of observation. There were no complications. Specifically, the patient did not have any untoward side effects - feeling disconnected from themself, their thoughts, feelings and things around them, dizziness, nausea, feeling sleepy, decreased feeling of sensitivity (numbness) spinning sensation, feeling anxious, lack of energy, increased blood pressure, feeling happy or very excited, or headache. Blood pressures remained within normal ranges at the 40-minute and 2-hour follow-up intervals. By the time the 2-hour observation period was met the patient was alert and oriented and able to exit without assistance. Patient willing to continue Spravato administration for the treatment of resistant depression.  Reports Depression today 2/10.  Anxiety 2/10.  See nursing note for further details.     AIMS    Flowsheet Row Video Visit from 01/03/2022 in [facility]  AIMS Total Score 0      ECT-MADRS    Flowsheet Row Clinical Support from 08/13/2022 in [facility] Video Visit from 04/29/2022 in [facility] Video Visit from 02/06/2022 in [facility]  MADRS Total Score 27 44 23      GAD-7    Flowsheet Row Office Visit from 08/22/2022 in [facility] [place]  Total GAD-7 Score 7      PHQ2-9    Flowsheet Row Office Visit from 08/22/2022 in [facility] [place] Office Visit from 08/19/2022 in [facility] Video Visit from 02/06/2022 in [facility] Office Visit from 11/16/2020 in [facility]  [place] Office Visit from 04/19/2018 in [facility] Total Score 3 0 6 0 0  PHQ-9 Total Score 5 -- 14 -- 2        Review of Systems:  Review of Systems  Constitutional: Negative.   Allergic/Immunologic: Negative.   Neurological: Negative.   Psychiatric/Behavioral:  Positive for dysphoric mood.     Medications: I have reviewed the patient's current medications.  Current Outpatient Medications  Medication Sig Dispense Refill   clonazePAM (KLONOPIN) 0.5 MG tablet 2 tablets at night and 1 tablet as needed daily for panic anxiety 75 tablet 1   Esketamine HCl, 84 MG Dose, (SPRAVATO, 84 MG DOSE,) 28 MG/DEVICE SOPK USE 3 SPRAYS IN EACH NOSTRIL EVERY 3 DAYS 3 each 1   Estradiol (VAGIFEM) 10 MCG TABS vaginal tablet Place 1 tablet (10 mcg total) vaginally 2 (two) times a week. 24 tablet 3   MAGNESIUM PO Take by mouth.     Methylphenidate (COTEMPLA XR-ODT) 17.3 MG TBED Take 1 tablet (17.3 mg total) by mouth every morning. 30 tablet 0   Multiple Vitamin (MULTIVITAMIN) tablet Take 1 tablet by mouth daily.     pramipexole (MIRAPEX) 1 MG tablet Take 1 tablet (1 mg total) by mouth in the morning, at noon, in the evening, and at bedtime. 120 tablet 0   sertraline (ZOLOFT) 100 MG tablet Take 2 tablets (200 mg  total) by mouth daily. 180 tablet 0   Suvorexant (BELSOMRA) 20 MG TABS Take 1 tablet (20 mg total) by mouth at bedtime. 30 tablet 0   valACYclovir (VALTREX) 500 MG tablet Take one twice daily for 3 days with any symptoms 30 tablet 3   valsartan (DIOVAN) 80 MG tablet Take 1 tablet (80 mg total) by mouth daily. 90 tablet 3   No current facility-administered medications for  this visit.    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   TRANSAMINASES, SERUM, ELEVATED 08/14/2009    Past Medical History, Surgical history, Social history, and Family history were reviewed and updated as appropriate.   Please see review of systems for further details on the patient's review from today.   Objective:   Physical Exam:  LMP 12/27/2007 (Exact Date)   Physical Exam Psychiatric:        Attention and Perception: Attention and perception normal.        Mood and Affect: Mood and affect normal.        Speech: Speech normal.        Behavior: Behavior normal. Behavior is cooperative.        Cognition and Memory: Cognition and memory normal.        Judgment: Judgment normal.     Lab Review:     Component Value Date/Time   NA 132 (L) 10/27/2022 1107   K 4.4 10/27/2022 1107   CL 95 (L) 10/27/2022 1107   CO2 30 10/27/2022 1107   GLUCOSE 89 10/27/2022 1107   BUN 14 10/27/2022 1107   CREATININE 0.67 10/27/2022 1107   CREATININE 0.68 01/26/2020 0920   CALCIUM 10.1 10/27/2022 1107   PROT 8.3 04/22/2022 0932   ALBUMIN 5.3 (H) 04/22/2022 0932   AST 82 (H) 04/22/2022 0932   ALT 118 (H) 04/22/2022 0932   ALKPHOS 74 04/22/2022 0932   BILITOT 0.3 04/22/2022 0932   GFRNONAA >90 05/01/2014 1645   GFRAA >90 05/01/2014 1645       Component Value Date/Time   WBC 3.3 (L) 04/22/2022 0932   RBC 4.25 04/22/2022 0932   HGB 14.3 04/22/2022 0932   HGB 14.1 10/26/2014 1034   HCT 40.7 04/22/2022 0932   PLT 245.0 04/22/2022 0932   MCV 95.7 04/22/2022 0932   MCH 34.4 (H) 01/26/2020 0920   MCHC  35.1 04/22/2022 0932   RDW 13.2 04/22/2022 0932   LYMPHSABS 0.9 04/22/2022 0932   MONOABS 0.2 04/22/2022 0932   EOSABS 0.1 04/22/2022 0932   BASOSABS 0.0 04/22/2022 0932    No results found for: "POCLITH", "LITHIUM"   No results found for: "PHENYTOIN", "PHENOBARB", "VALPROATE", "CBMZ"   .res Assessment: Plan:   Recommendations/Plan   Continue Spravato treatment.   Patient advised to contact office with any questions, adverse effects, or acute worsening in signs and symptoms.        Diagnoses and all orders for this visit:  Recurrent major depression resistant to treatment [facility])     Please see After Visit Summary for patient  specific instructions.  Future Appointments  Date Time Provider Department Center  11/03/2022  2:00 PM Cottle, Steva Ready., MD [facility] None  11/03/2022  2:00 PM CP-NURSE [facility] None  11/10/2022  2:00 PM Cottle, Steva Ready., MD [facility] None  11/10/2022  2:00 PM CP-NURSE CP[facility] None  09/18/2023  8:15 AM Jerene Bears, MD [facility]    No orders of the defined types were placed in this encounter.   -------------------------------

## 2022-10-27 NOTE — Telephone Encounter (Signed)
Noted  

## 2022-11-03 ENCOUNTER — Ambulatory Visit: Payer: 59

## 2022-11-03 ENCOUNTER — Encounter: Payer: Self-pay | Admitting: Psychiatry

## 2022-11-03 ENCOUNTER — Ambulatory Visit (INDEPENDENT_AMBULATORY_CARE_PROVIDER_SITE_OTHER): Payer: 59 | Admitting: Psychiatry

## 2022-11-03 VITALS — BP 112/75 | HR 77

## 2022-11-03 DIAGNOSIS — F339 Major depressive disorder, recurrent, unspecified: Secondary | ICD-10-CM

## 2022-11-03 DIAGNOSIS — G4700 Insomnia, unspecified: Secondary | ICD-10-CM

## 2022-11-03 DIAGNOSIS — F411 Generalized anxiety disorder: Secondary | ICD-10-CM

## 2022-11-03 DIAGNOSIS — F900 Attention-deficit hyperactivity disorder, predominantly inattentive type: Secondary | ICD-10-CM | POA: Diagnosis not present

## 2022-11-03 NOTE — Progress Notes (Signed)
Heather Davila [id number] October 29, 1960 62 y.o.  Subjective:   Patient ID:  Heather Davila is a 62 y.o. (DOB 1961-02-13) female.  Chief Complaint:  Chief Complaint  Patient presents with   Follow-up   Depression   Anxiety   ADD   Sleeping Problem    HPI Heather Davila presents to the office today for follow-up of treatment resistant recurrent depression, generalized anxiety disorder and insomnia.  Almost too numerous to count med failures. She is here for Spravato administration for her treatment resistant depression.  05/21/22 appt noted: Patient was administered first dose Spravato 56 mg intranasally today.  The patient experienced the typical dissociation which gradually resolved over the 2-hour period of observation.  There were no complications.  Specifically the patient did not have nausea or vomiting or headache.   Dissociation was mild. Did not experience any emotional relief from disabling depression.wants to increase Spravato to the usual dose.  She is aware insurance is not covering the costs at this time.  No SI acutely. Tolerating meds.    05/23/22 appt noted: Patient was administered Spravato 84 mg intranasally today.  The patient experienced the typical dissociation which gradually resolved over the 2-hour period of observation.  There were no complications.  Specifically the patient did not have nausea or vomiting.   Dissociation was greater than last dose and a little bothersome DT some HA. Tolerating meds.   Mood has not changed significantly thus far with Spravato.  05/27/22 appt noted: Patient was administered Spravato 84 mg intranasally today.  The patient experienced the typical dissociation which gradually resolved over the 2-hour period of observation.  There were no complications.  Specifically the patient did not have nausea or vomiting or headache. Today she has felt a little relief from the pressing heaviness of depression but a little more anxiety.  During  administration of Spravato she listens to [place] music and was able to relax a little more with the feeling of dissociation that was mildly bothersome. Husband was also in on the session today and asked some questions about IV ketamine versus nasal spray ketamine.  05/29/22 appt noted: Cancelled today DT hypertension  05/30/22 appt noted: Patient was administered Spravato 84 mg intranasally today.  The patient experienced the typical dissociation which gradually resolved over the 2-hour period of observation.  There were no complications.  Specifically the patient did not have nausea or vomiting or headache. She is seeing some improvement in mood with less continuous depression and less intensity.  Hopefulness is better.   No med complaints or SE  06/05/22 appt noted: Patient was administered Spravato 84 mg intranasally today.  The patient experienced the typical dissociation which gradually resolved over the 2-hour period of observation.  There were no complications.  Specifically the patient did not have nausea or vomiting or headache.  Specifically HA is less of a problem with Spravato. No SE with meds. Depression is improving with less intensity.  Intermittent anxiety easily.  More able to enjoy things.  No new concerns.  06/17/22 appt noted: Patient was administered Spravato 84 mg intranasally today.  The patient experienced the typical dissociation which gradually resolved over the 2-hour period of observation.  There were no complications.  Specifically the patient did not have nausea or vomiting or headache.  Specifically HA is less of a problem with Spravato. No SE with meds. Depression is improved 45% with less intensity.  Intermittent anxiety less easily.  More able to enjoy things.  No new concerns.  More  active. Current meds: increased clonazepam to about 1.5 mg HS DT recent insomnia, lexapro 20, Dayvigo 10 mg HS, concerta 36 mg AM, trazodone 100 mg HS.  06/20/22 appt noted: Current  meds: increased clonazepam to about 1.5 mg HS DT recent insomnia, lexapro 20, Dayvigo 10 mg HS, concerta 36 mg AM, trazodone 100 mg HS. Patient was administered Spravato 84 mg intranasally today.  The patient experienced the typical dissociation which gradually resolved over the 2-hour period of observation.  There were no complications.  Specifically the patient did not have nausea or vomiting or headache.  Specifically HA is less of a problem with Spravato. No SE with meds. Depression is improved 45% with less intensity.  Intermittent anxiety less easily.  More able to enjoy things.  No new concerns.  More active.  06/23/22 appt noted:  Current meds: increased clonazepam to about 1.5 mg HS DT recent insomnia, lexapro 20, Dayvigo 10 mg HS, concerta 36 mg AM, trazodone 100 mg HS. Patient was administered Spravato 84 mg intranasally today.  The patient experienced the typical dissociation which gradually resolved over the 2-hour period of observation.  There were no complications.  Specifically the patient did not have nausea or vomiting or headache.  Specifically HA is less of a problem with Spravato. No SE with meds. Intensity of Spravato dissociation varies.  Last week very intesne and this week more typical.  Depression is continuing to improve with better interest and activity and motivation.  Gradual improvement.  Wants to continue.  06/25/22 appt noted: Current meds: increased clonazepam to about 1.5 mg HS DT recent insomnia, lexapro 20, Dayvigo 10 mg HS, concerta 36 mg AM, trazodone 100 mg HS. Patient was administered Spravato 84 mg intranasally today.  The patient experienced the typical dissociation which gradually resolved over the 2-hour period of observation.  There were no complications.  Specifically the patient did not have nausea or vomiting or headache.  Specifically HA is less of a problem with Spravato. No SE with meds. Intensity of Spravato dissociation varies.  No scary and well  tolerated. Continues to gradually improve with Spravato.  She is more motivated and enjoying things more.  H sees progress.  Wants to continue twice weekly until gets maximum response.  Dep is not gone yet.  06/30/22 appt noted:  seen with H Current meds: increased clonazepam to about 1.5 mg HS DT recent insomnia, lexapro 20, Dayvigo 10 mg HS, concerta 36 mg AM, trazodone 100 mg HS. Patient was administered Spravato 84 mg intranasally today.  The patient experienced the typical dissociation which gradually resolved over the 2-hour period of observation.  There were no complications.  Specifically the patient did not have nausea or vomiting or headache.  Specifically HA is less of a problem with Spravato. No SE with meds. Intensity of Spravato dissociation varies.  No scary and well tolerated. Continues to gradually improve with Spravato.  She is more motivated and enjoying things more.  H sees even more progress than she does; more active and smiling.  Wants to continue twice weekly until gets maximum response.  Dep is 80% better.  07/07/22 appt noted: Current meds: increased clonazepam to about 1.5 mg HS DT recent insomnia, lexapro 20, Dayvigo 10 mg HS, concerta 36 mg AM, trazodone 100 mg HS. Patient was administered Spravato 84 mg intranasally today.  The patient experienced the typical dissociation which gradually resolved over the 2-hour period of observation.  There were no complications.  Specifically the patient did not have  nausea or vomiting or headache.  Specifically HA is less of a problem with Spravato. No SE with meds. Intensity of Spravato dissociation varies.  No scary and well tolerated.  Was more intese today. Overall mood is markedly better and please with meds. No changes desired.  07/09/22 appt noted: In transition from Lexapro to sertraline and missing Concerta bc CO crash from it after about 4 hours.  Disc these concerns.  She'll discuss also with Corie Chiquito, NP More dep the  last couple of days and concerned about reducing Spravato frequency while transition from one antidep to another.  Affect less positive. Patient was administered Spravato 84 mg intranasally today.  The patient experienced the typical dissociation which gradually resolved over the 2-hour period of observation.  There were no complications.  Specifically the patient did not have nausea or vomiting or headache.  Specifically HA is less of a problem with Spravato. No SE with meds. Intensity of Spravato dissociation varies.  No scary and well tolerated.   07/14/22 appt noted: Has been more depressed this week.  More trouble with sleep.  Taking clonazepam 1-1.5  of the 0.5 mg tablets.   Trazodone not helping much. She switched back from 200 sertraline to Lexapro 20.  No SE More trouble with motivation.  Some stress with son with special needs. Misses the benevit of Concerta but it was too short acting and crashed in 4-6 hours. Patient was administered Spravato 84 mg intranasally today.  The patient experienced the typical dissociation which gradually resolved over the 2-hour period of observation.  There were no complications.  Specifically the patient did not have nausea or vomiting or headache.  Specifically HA is less of a problem with Spravato. No SE with meds. Intensity of Spravato dissociation varies.  No scary and well tolerated.   07/17/22 appt noted: Patient was administered Spravato 84 mg intranasally today.  The patient experienced the typical dissociation which gradually resolved over the 2-hour period of observation.  There were no complications.  Specifically the patient did not have nausea or vomiting or headache.  Specifically HA is less of a problem with Spravato. No SE with meds. Intensity of Spravato dissociation varies.  Not scary and well tolerated.  Just got Jornay last night but wasn't sure how to take it.  Wants to start and this was discussed.  She felt MPH helped mood and attn but  didn't last long enough and had crash after a few hours on Concerta.  Better for 2 hours after each dose Ritalin 20 mg with mood but then it wore off.  BP up after 3rd dose 160/90 and then took H's amlodipine 5 and BP became normal.  07/22/22 appt noted: Patient was administered Spravato 84 mg intranasally today.  The patient experienced the typical dissociation which gradually resolved over the 2-hour period of observation.  There were no complications.  Specifically the patient did not have nausea or vomiting or headache.  Specifically HA is less of a problem with Spravato. No SE with meds. Intensity of Spravato dissociation varies.  Not scary and well tolerated.  Start Jornay 20 mg over the weekend and did not notice any particular effect good or bad.  Increased to 40 mg. Other psych meds: Clonazepam 0.5 mg tablets 2 nightly, gabapentin dosage varies, Dayvigo 10 mg nightly, Lexapro 20 mg daily, to trazodone 100 mg nightly  07/24/22 appt noted: Patient was administered Spravato 84 mg intranasally today.  The patient experienced the typical dissociation which gradually resolved  over the 2-hour period of observation.  There were no complications.  Specifically the patient did not have nausea or vomiting or headache.  Specifically HA is less of a problem with Spravato. No SE with meds. Intensity of Spravato dissociation varies.  Not scary and well tolerated.  Other psych meds: Clonazepam 0.5 mg tablets 2 nightly, gabapentin dosage varies, Dayvigo 10 mg nightly, Lexapro 20 mg daily, to trazodone 100 mg nightly, Jornay 40 mg PM Doing well with Spravato.  Noticed a little effect from the Jornay 40 without SE but would like to increase the dose for ADD and mood.  Sleeping well.  No new concerns.  Still more depressed than she was with th initial benefit of Spravato.  07/30/22 note:  Patient was administered Spravato 84 mg intranasally today.  The patient experienced the typical dissociation which gradually  resolved over the 2-hour period of observation.  There were no complications.  Specifically the patient did not have nausea or vomiting or headache.  Specifically HA is less of a problem with Spravato. Other psych meds: Clonazepam 0.5 mg tablets 2 nightly, gabapentin dosage varies, Dayvigo 10 mg nightly, Lexapro 20 mg daily, to trazodone 100 mg nightly, Jornay 60 mg PM Wants to increase Jornay to 80 mg PM to increase benefit for ADD and depression. No SE She has experienced a major family crisis that threatens to change her daily life from now on.  It has not triggered suicidal thoughts at this time but she is very afraid.  No desire to change medications.  08/04/22 appt noted" Patient was administered Spravato 84 mg intranasally today.  The patient experienced the typical dissociation which gradually resolved over the 2-hour period of observation.  There were no complications.  Specifically the patient did not have nausea or vomiting or headache.  Specifically HA is less of a problem with Spravato. Other psych meds: Clonazepam 0.5 mg tablets 2 nightly, gabapentin dosage varies, Dayvigo 10 mg nightly, Lexapro 20 mg daily, trazodone 100 mg nightly, Jornay 80 mg PM No SE No benefit or SE with increase Jormay 80 mg.  Says Concerta helped ADD and depression but not Jornay.  However duration was insufficient.  Still depressed and wants change to another form of stimulant.  No concerns with other meds. Continues with strong family stressors creating uncertainty about her future but no quick resolution. No SI Trial Cotempla 17.3 and DC Ritalin & Jornay 80  08/11/2022 appointment noted: Patient was administered Spravato 84 mg intranasally today.  The patient experienced the typical dissociation which gradually resolved over the 2-hour period of observation.  There were no complications.  Specifically the patient did not have nausea or vomiting or headache.  Specifically HA is less of a problem with Spravato now  vs when started it.. Other psych meds: Clonazepam 0.5 mg tablets 2 nightly, gabapentin dosage varies, Dayvigo 10 mg nightly, trazodone 100 mg nightly, cotempla 17.3, switch from Lexapro 20 to sertaline 15o mg . No noticeable effects sertraline from for depression but has seen some antianxiety effects from the sertraline.  No side effects noted.  No side effects with the other medicines.  Still is only getting very brief benefit 3 to 4 hours from Cotempla for ADD and mood.  She wants to try the Daytrana patch as we have discussed before. No SE  08/13/22 appt: Patient was administered Spravato 84 mg intranasally today.  The patient experienced the typical dissociation which gradually resolved over the 2-hour period of observation.  There were no  complications.  Specifically the patient did not have nausea or vomiting or headache.  Specifically HA is less of a problem with Spravato now vs when started it.. Other psych meds: Clonazepam 0.5 mg tablets 2 nightly, gabapentin dosage varies, Dayvigo 10 mg nightly, trazodone 100 mg nightly, cotempla 17.3, switch from Lexapro 20 to sertaline 15o mg . No noticeable effects sertraline from for depression but has seen some antianxiety effects from the sertraline.  No side effects noted.  No side effects with the other medicines.  Still is only getting very brief benefit 3 to 4 hours from Cotempla for ADD and mood.  She wants to try the Daytrana patch as we have discussed before.  Hasn't been able to get it yet. No SE Plan.  Continue to try to get Daytrana 30 AM  08/18/22 appt noted: Patient was administered Spravato 84 mg intranasally today.  The patient experienced the typical dissociation which gradually resolved over the 2-hour period of observation.  There were no complications.  Specifically the patient did not have nausea or vomiting or headache.  Specifically HA is less of a problem with Spravato now vs when started it.. Other psych meds: Clonazepam 0.5 mg  tablets 2 nightly, gabapentin dosage varies, Dayvigo 10 mg nightly, trazodone 100 mg nightly, cotempla 17.3, switch from Lexapro 20 to sertaline 15o mg . No noticeable effects sertraline from for depression but has seen some antianxiety effects from the sertraline.  No side effects noted.  No side effects with the other medicines.  Still is only getting very brief benefit 3 to 4 hours from Cotempla for ADD and mood.  She wants to try the Daytrana patch as we have discussed before.  Hasn't been able to get it yet.  Overall depression is some better than it was.   No SE Plan.  Continue to try to get Daytrana 30 AM  08/25/22 appt : No benefit Daytrana.  No SE.  Wants further med changes and interested In retrying pramipexole.   Tolerating meds. Patient was administered Spravato 84 mg intranasally today.  The patient experienced the typical dissociation which gradually resolved over the 2-hour period of observation.  There were no complications.  Specifically the patient did not have nausea or vomiting or headache.  Specifically HA is less of a problem with Spravato now vs when started it.. Other psych meds: Clonazepam 0.5 mg tablets 2 nightly, gabapentin dosage varies, Dayvigo 10 mg nightly, trazodone 100 mg nightly, , switch from Lexapro 20 to sertaline 15o mg . Daytrana 30 AM for a few days. Struggles with depression and no SI.  Reduced interest and mood and motivation and enjoyment and socialization.  08/27/22 appt noted: Psych meds:  Clonazepam 0.5 mg tablets 2 nightly, gabapentin dosage varies, Dayvigo 10 mg nightly, trazodone 100 mg nightly,  sertaline 15o mg . Returned to [employer] AM, pramipexole  Tolerating meds. Patient was administered Spravato 84 mg intranasally today.  The patient experienced the typical dissociation which gradually resolved over the 2-hour period of observation.  There were no complications.  Specifically the patient did not have nausea or vomiting or headache.   Reports  taking cotempla bc brief mood benefit and sig focus and productivity benefit. Took 1 dose pramipexole and felt more dep.  09/01/22 appt noted: Psych meds:  Clonazepam 0.5 mg tablets 2 nightly, gabapentin dosage varies, Dayvigo 10 mg nightly, trazodone 100 mg nightly,  sertaline 15o mg . Returned to Cotempla AM, pramipexole  0.25 mg BID. Tolerating meds now. Patient  was administered Spravato 84 mg intranasally today.  The patient experienced the typical dissociation which gradually resolved over the 2-hour period of observation.  There were no complications.  Specifically the patient did not have nausea or vomiting or headache.   Willing to try the pramipexole and will continue 0.25 mg BID this week and then consider increase if tolerated.  Mood is a little better.  Still looking for further improvement.  09/03/22 appt noted: Psych meds:  Clonazepam 0.5 mg tablets 2 nightly, gabapentin dosage varies, Dayvigo 10 mg nightly, trazodone 100 mg nightly,  sertaline 15o mg . Returned to Cotempla AM, pramipexole  0.25 mg tablet 1 and 1/2 tablets twice daily BID. Tolerating meds now. Patient was administered Spravato 84 mg intranasally today.  The patient experienced the typical dissociation which gradually resolved over the 2-hour period of observation.  There were no complications.  Specifically the patient did not have nausea or vomiting or headache.   Depression may be a little better with the parmipexole and is tolerating it well so far.  Still struggling with focus and residual depression.  Tolerating meds without SE.  More hopeful and able to enjoy some things.  09/08/22 appt  Psych meds:  Clonazepam 0.5 mg tablets 2 nightly, gabapentin dosage varies, Dayvigo 10 mg nightly, trazodone 100 mg nightly,  sertaline 15o mg . Returned to Cotempla AM, pramipexole  0.5 mg BID. Tolerating meds now. Patient was administered Spravato 84 mg intranasally today.  The patient experienced the typical dissociation which  gradually resolved over the 2-hour period of observation.  There were no complications.  Specifically the patient did not have nausea or vomiting or headache.   She has seen her depression drop from a 10/10 prior to treatment with Spravato to now 4/10 With additional benefit noted when she increased the pramipexole to 0.5 mg twice daily.  She is not having side effects with this like she did in the past. Anxiety levels are also improved. She is sleeping okay with the current medications. Plan: continue pramipexole trial off-label and increase more slowly for TRD.  Increase  Gradually  to 0.75 mg BID  09/11/22 appt noted: Psych meds:  Clonazepam 0.5 mg tablets 2 nightly, gabapentin dosage varies, Dayvigo 10 mg nightly, trazodone 100 mg nightly,  sertaline 15o mg . Returned to Cotempla AM, pramipexole  0.75 mg BID. Tolerating meds now. Patient was administered Spravato 84 mg intranasally today.  The patient experienced the typical dissociation which gradually resolved over the 2-hour period of observation.  There were no complications.  Specifically the patient did not have nausea or vomiting or headache.   She has seen her depression drop from a 10/10 prior to treatment with Spravato to now 4/10 With additional benefit noted when she increased the pramipexole to 0.5 mg twice daily.  She is not having side effects with this like she did in the past. Clearly improving with the pramipexole now and tolerating it.  Satisfied with current meds but would consider increasing if it might be helpful.  09/15/22 appt noted: Psych meds:  Clonazepam 0.5 mg tablets 2 nightly, gabapentin dosage varies, Dayvigo 10 mg nightly, trazodone 100 mg nightly,  sertaline 15o mg . Returned to Cotempla AM, pramipexole  0.75 mg BID too anxious and reduced to 0.5 mg BID. Tolerating meds now. Patient was administered Spravato 84 mg intranasally today.  The patient experienced the typical dissociation which gradually resolved over  the 2-hour period of observation.  There were no complications.  Specifically  the patient did not have nausea or vomiting or headache.   Trouble staying asleep longer than 6 hours.  Doesn't feel she can tolerate pramipexole 0.75 mg BID DT anxiety not experienced at  0.5 mg BID.  Otherwise tolerating meds.  Mood is better with Spravato and pramipexole.   Not sleeping as long with meds.  EMA 6 hours.  Wonders about change Disc concerns about waiting for therapy. Feels ready to reduce Spravato to weekly.    09/25/22 appt noted: Psych meds:  Clonazepam 0.5 mg tablets 2 nightly, gabapentin dosage varies, Dayvigo 10 mg nightly, trazodone 100 mg nightly,  sertaline 15o mg . Returned to Cotempla AM, pramipexole  0.75 mg BID too anxious and reduced to 0.5 mg BID. Tolerating meds now. Patient was administered Spravato 84 mg intranasally today.  The patient experienced the typical dissociation which gradually resolved over the 2-hour period of observation.  There were no complications.  Specifically the patient did not have nausea or vomiting or headache.   Trouble staying asleep longer than 6 hours.  Failed to respond to Seroquel 25 mg HS.  Gone back to [facility].  Belsomra NR. Mood ok until the last few days more dep bc it's been 10 days since last admin.  Wants to continue weekly.   No SE.  No med changes desired.  09/29/22 appt noted: Psych meds:  Clonazepam 0.5 mg tablets 2 nightly, gabapentin dosage varies, Dayvigo 10 mg nightly, trazodone 100 mg nightly,  sertaline 15o mg . Returned to Cotempla AM, pramipexole  0.75 mg BID too anxious and reduced to 0.5 mg BID. Tolerating meds now. Patient was administered Spravato 84 mg intranasally today.  The patient experienced the typical dissociation which gradually resolved over the 2-hour period of observation.  There were no complications.  Specifically the patient did not have nausea or vomiting or headache.   Trouble staying asleep longer than 6 hours.  Plan:  Continue sertaline to 200 mg daily.  It has helped anxiety but not depression.   for insomnia, clonazepam 1 mg HS. trazodone doesn't work well but helps some Continue Dayvigo 10 HS.  Failed resp Belsomra 15 and seroquel 25 She wants to continue Cotempla or Concerta bc helps mood early in day and focus  benefit lasts longer.  Have not been able to get stimulant to be consitently effective throughout the day. continue pramipexole trial off-label and reduce back to  TRD 0.5 mg BID Spravato weekly.  10/13/22 appt noted: Not sleeping as well.  Wants to increase Spravato to twice weekly bc feeling more dep close to the next sched date.  Just the day or 2 before.  .  Plan: Trial for off label TRD increase pramipexole 1 mg TID  10/20/22 appt noted: Psych meds:  Clonazepam 0.5 mg tablets 2 nightly, gabapentin dosage varies, Dayvigo 10 mg nightly, trazodone 100 mg nightly,  sertaline 15o mg . Returned to Cotempla AM, pramipexole  1 mg TID. Tolerating meds now. Patient was administered Spravato 84 mg intranasally today.  The patient experienced the typical dissociation which gradually resolved over the 2-hour period of observation.  There were no complications.  Specifically the patient did not have nausea or vomiting or headache.   Trouble staying asleep longer than 6 hours.  More dep this week and asked to get Spravato twice this week. Plan: Trial for off label TRD increase pramipexole 1 mg QID  10/22/22 appt noted:  Psych meds:  Clonazepam 0.5 mg tablets 2 nightly and another one with  EMA, gabapentin dosage varies, Belsomra 20 mg daily,  trazodone 100 mg nightly,  sertaline 15o mg . Returned to Cotempla AM, pramipexole  1 mg QID for 2nd day Tolerating meds now.  Except a little N and dizzy briefly after takes it. Patient was administered Spravato 84 mg intranasally today.  The patient experienced the typical dissociation which gradually resolved over the 2-hour period of observation.  There were no  complications.  Specifically the patient did not have nausea or vomiting or headache. No change in mood yet.   Sleep better with Belsomra 20 as long as takes with other meds.  If insomnia mood is worse. Ongoing some dep but gets partial relief with Spravato but would like better relief .  10/27/22 received Spravato 84  11/03/22 appt noted:  Meds as above except sertraline she cut to 100 mg instead of 200 mg daily She doesn't think sertraline helping and worries over poss SE Off and on Cotempla .  Not affecting BP. Doesn't notice benefit or SE with pramipexole 1 mg QID.  No SE Sleep is OK.  Some awakening aftter 6 hours but not enough so takes another clonazepam.   Patient was administered Spravato 84 mg intranasally today.  The patient experienced the typical dissociation which gradually resolved over the 2-hour period of observation.  There were no complications.  Specifically the patient did not have nausea or vomiting or headache. No change in mood yet.   Sleep better with Belsomra 20 as long as takes with other meds.  If insomnia mood is worse. Ongoing some dep but gets partial relief with Spravato but would like better relief .  No sig benefit with pramipexole 1 mg QID.     AIMS    Flowsheet Row Video Visit from 01/03/2022 in [facility]  AIMS Total Score 0      ECT-MADRS    Flowsheet Row Clinical Support from 08/13/2022 in [facility] Video Visit from 04/29/2022 in [facility] Video Visit from 02/06/2022 in [facility]  MADRS Total Score 27 44 23      GAD-7    Flowsheet Row Office Visit from 08/22/2022 in [facility] at [place]  Total GAD-7 Score 7      PHQ2-9    Flowsheet Row Office Visit from 08/22/2022 in [facility] [place] Office Visit from 08/19/2022 in [facility] at [facility] Video Visit from 02/06/2022 in [facility] Office Visit from 11/16/2020 in [facility] at [place] Office Visit from 04/19/2018 in [facility] at [place]  PHQ-2 Total Score 3 0 6 0 0  PHQ-9 Total Score 5 -- 14 -- 2        Past Psychiatric Medication Trials: Trintellix - Initially effective and then was not effective when re-started Paxil- Ineffective. Helped initially. Prozac-Insomnia Lexapro- Effective and then no longer as effective Zoloft- Initially effective and then not as effective.  Viibryd Cymbalta- Ineffective. Helped initially. Effexor XR- Effective, well tolerated. Pristiq- Increased anxiety at 150 mg daily Wellbutrin XL-Ineffective.May have increased anxiety. Amitriptyline Nortriptyline-Caused irritability, constipation Auvelity- ineffective  Abilify- Helpful but caused severe insomnia. Rexulti-Helpful but caused insomnia. Vraylar Seroquel - Ineffective Risperdal Olanzapine- Ineffective Latuda- Ineffective Caplyta  Klonopin- Effective Temazepam- Took during menopause Lunesta- Ineffective Ambien-Ineffective Sonata- Ineffective Dayvigo- partially effective  Failed resp Belsomra 15 and seroquel 25 Trazodone-  somewhat  effective Remeron-Ineffective. Does not recall wt gain.  Quetiapine 25 NR  Adderall- Took once and had adverse effect. Concerta- Effective but only 4-6 hours Jornay 80 NR Metadate-Not as effective as Concerta Dexmethylphenidate- Not as effective as Concerta Azstarys- some side effects. Not as effective.  Ritalin short acting and SE anxiety & HTN @20  TID Cotemplay 17.3  Daytrana  Lamictal- May have had an adverse effects. "Felt weird." Reports worsening s/s.  Lithium Gabapentin Pramipexole- "felt really weird."   ECT #20 with minimal response. H says it was partly helpful.  AIMS    Flowsheet Row Video Visit from 01/03/2022 in [facility]  AIMS Total Score 0      ECT-MADRS    Flowsheet Row Clinical Support from 08/13/2022 in [facility] Video Visit from 04/29/2022 in [facility] Video Visit from 02/06/2022 in [facility]  MADRS Total Score 27 44 23      GAD-7    Flowsheet Row Office Visit from 08/22/2022 in [facility] [place]  Total GAD-7 Score 7      PHQ2-9    Flowsheet Row Office Visit from 08/22/2022 in [facility] [place] Office Visit from 08/19/2022 in [facility] Video Visit from 02/06/2022 in [facility] Office Visit from 11[facility]16/2020 in [facility] at [place] Office Visit from 04/19/2018 in [facility] at [facility] Total Score 3 0 6 0 0  PHQ-9 Total Score 5 -- 14 -- 2        Review of Systems:  Review of Systems  Cardiovascular:  Negative for palpitations.  Neurological:  Negative for dizziness and tremors.  Psychiatric/Behavioral:  Positive for dysphoric mood and sleep disturbance. Negative for decreased concentration. The patient is nervous/anxious.     Medications: I have reviewed the patient's current medications.  Current Outpatient Medications  Medication Sig Dispense Refill   clonazePAM (KLONOPIN) 0.5 MG tablet 2 tablets at night and 1 tablet as needed daily for panic anxiety 75 tablet 1   Esketamine HCl, 84 MG Dose, (SPRAVATO, 84 MG DOSE,) 28 MG/DEVICE SOPK USE 3 SPRAYS IN EACH NOSTRIL EVERY 3 DAYS 3 each 1   Estradiol (VAGIFEM) 10 MCG TABS vaginal tablet Place 1 tablet (10 mcg total) vaginally 2 (two) times a week. 24 tablet 3   MAGNESIUM PO Take by mouth.     Methylphenidate (COTEMPLA XR-ODT) 17.3 MG TBED Take 1 tablet (17.3 mg total) by mouth every morning. 30 tablet 0   Multiple Vitamin  (MULTIVITAMIN) tablet Take 1 tablet by mouth daily.     pramipexole (MIRAPEX) 1 MG tablet Take 1 tablet (1 mg total) by mouth in the morning, at noon, in the evening, and at bedtime. 120 tablet 0   sertraline (ZOLOFT) 100 MG tablet Take 2 tablets (200 mg total) by mouth daily. 180 tablet 0   Suvorexant (BELSOMRA) 20 MG TABS Take 1 tablet (20 mg total) by mouth at bedtime. 30 tablet 0   valACYclovir (VALTREX) 500 MG tablet Take one twice daily for 3 days with any symptoms 30 tablet 3   valsartan (DIOVAN) 80 MG tablet Take 1 tablet (80 mg total) by mouth daily. 90 tablet 3   No current facility-administered medications for this visit.    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   TRANSAMINASES, SERUM, ELEVATED 08/14/2009    Past Medical History, Surgical history, Social history, and Family history were reviewed and updated as appropriate.   Please see review of systems for further details on the patient's review from today.   Objective:   Physical Exam:  LMP 12/27/2007 (Exact Date)   Physical Exam Constitutional:      General: She is not in acute distress. Musculoskeletal:        General: No deformity.  Neurological:     Mental Status: She is alert and oriented to person, place, and time.     Coordination: Coordination normal.  Psychiatric:        Attention and Perception: Attention and perception normal. She does not perceive auditory hallucinations.        Mood and Affect: Mood is anxious and depressed. Affect is not labile or blunt.        Speech: Speech normal. Speech is not slurred.         Behavior: Behavior normal.        Thought Content: Thought content normal. Thought content is not delusional. Thought content does not include homicidal or suicidal ideation. Thought content does not include suicidal plan.        Cognition and Memory: Cognition and memory normal.        Judgment: Judgment normal.     Comments: Insight intact Mood still mod dep lately Affect blunted mildly     Lab Review:     Component Value Date/Time   NA 132 (L) 10/27/2022 1107   K 4.4 10/27/2022 1107   CL 95 (L) 10/27/2022 1107   CO2 30 10/27/2022 1107   GLUCOSE 89 10/27/2022 1107   BUN 14 10/27/2022 1107   CREATININE 0.67 10/27/2022 1107   CREATININE 0.68 01/26/2020 0920   CALCIUM 10.1 10/27/2022 1107   PROT 8.3 04/22/2022 0932   ALBUMIN 5.3 (H) 04/22/2022 0932   AST 82 (H) 04/22/2022 0932   ALT 118 (H) 04/22/2022 0932   ALKPHOS 74 04/22/2022 0932   BILITOT 0.3 04/22/2022 0932   GFRNONAA >90 05/01/2014 1645   GFRAA >90 05/01/2014 1645       Component Value Date/Time   WBC 3.3 (L) 04/22/2022 0932   RBC 4.25 04/22/2022 0932   HGB 14.3 04/22/2022 0932   HGB 14.1 10/26/2014 1034   HCT 40.7 04/22/2022 0932   PLT 245.0 04/22/2022 0932   MCV 95.7 04/22/2022 0932   MCH 34.4 (H) 01/26/2020 0920   MCHC 35.1 04/22/2022 0932   RDW 13.2 04/22/2022 0932   LYMPHSABS 0.9 04/22/2022 0932   MONOABS 0.2 04/22/2022 0932   EOSABS 0.1 04/22/2022 0932   BASOSABS 0.0 04/22/2022 0932    No results found for: "POCLITH", "LITHIUM"   No results found for: "PHENYTOIN", "PHENOBARB", "VALPROATE", "CBMZ"   .res Assessment: Plan:    Chrystelle was seen today for follow-up, depression, anxiety, add and sleeping problem.  Diagnoses and all orders for this visit:  Recurrent major depression resistant to treatment [facility])  Generalized anxiety disorder  Attention deficit hyperactivity disorder (ADHD), predominantly inattentive type  Insomnia, unspecified type   Pt seen for TRD and administration of  Spravato.  Patient was administered Spravato 84 mg intranasally today.  The patient experienced the typical dissociation which gradually resolved over the 2-hour period of observation.  There were no complications.  Specifically the patient did not have nausea or vomiting. But mild headache.  Blood pressures remained within normal ranges at the 40-minute and 2-hour follow-up intervals.  By the time the 2-hour observation period was met the patient was alert and oriented and able to exit without assistance.  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  See nursing note for further details.Tolerated the Spravato to 84mg  . Jcontinue  Spravato weekly vs increase to twice weekly.  She feels she needs to continue the higher frequency.    Overall is about 50% better with current regimen but would like better resp.  Consider switch Spravato to MAOI  We discussed the short-term risks associated with benzodiazepines including sedation and increased fall risk among others.  Discussed long-term side effect risk including dependence, potential withdrawal symptoms, and the potential eventual dose-related risk of dementia.  But recent studies from 2020 dispute this association between benzodiazepines and dementia risk. Newer studies in 2020 do not support an association with dementia.  Don't change sertraline  and continue 150 mg daily.  It has helped anxiety but not depression.  Disc importance of consistency and don't change meds on your own.  It can be confusing to evaluate and can sabotage tx.  for insomnia, clonazepam 1 mg HS.  trazodone doesn't work well but helps some Some better so far with Belsomra 20 mg HS  She wants to continue Cotempla or Concerta bc helps mood early in day and focus  benefit lasts longer.  Have not been able to get stimulant to be consitently effective throughout the day.  Trial for off label  TRD increase to max of pramipexole 5 mg daily.  If no benefit after 2 weeks then at  max to 5 mg daily then taper.  She is tolerating this without problems so far.  Disc SE incl risk impulsivity , mania, etc.  Clearly better with it and then seemed to lose effect.  No SE at these dosage.  FU weekly Spravato .  Meredith Staggers, MD, DFAPA    Please see After Visit Summary for patient specific instructions.  Future Appointments  Date Time Provider Department Center  11/10/2022  2:00 PM Cottle, Steva Ready., MD [facility] None  11/10/2022  2:00 PM CP-NURSE [facility] None  09/18/2023  8:15 AM Jerene Bears, MD [facility]    No orders of the defined types were placed in this encounter.   ------------------------------- [id number]

## 2022-11-03 NOTE — Progress Notes (Signed)
NURSES NOTE:     Patient arrived for her #42 weekly Spravato treatment. Pt is being treated for Treatment Resistant Depression, the starting dose is 56 mg (2 of the 28 mg) nasal sprays. Pt will be receiving 84 mg (3 of the 28 mg) nasal sprays, this is her maintenance dose. Patient taken to treatment room, I explained and discussed her treatment and how the schedule should go, along with any side effects that may occur. Answered any questions and concerns the patient had. Pt's Spravato is delivered through [place] and she is now billing with her insurance. Spravato medication is stored at doctors office per REMS/FDA guidelines. The medication is required to be locked behind two doors per FDA/REMS Protocol. Medication is also disposed of properly per regulations. All treatments and her vital signs are documented in Spravato REMS per protocol of treatment center regulations.      Vital Signs assessed at 2:00 PM 121/61, pulse 74, pulse ox 92%. Instructed pt to blow her nose and recline back slightly to prevent any of medication dripping out of her nose.  Pt. Given 1st dose (28 mg inhaler), then 5 minutes between 2nd dose and 3rd dose. No complaints of nausea/vomiting reported. Pt did listen to music today, she just reclined back and closed her eyes. After 40 minutes I went to assess her vital signs, no side effects or symptoms reported, B/P 91/64, pulse 75. Dr. Jennelle Human talked to pt today about her treatment. Nurse was with pt a total of 60 minutes but pt observed for 120 minutes per protocol. Discharge vital signs were at 3:53 PM, B/P 112/75, pulse 77. She reports dissociation but she was clear upon her discharge. She is scheduled on Monday, July 15th.  She is instructed to contact office if needing anything prior to her next treatment.     LOT # [id number] EXP [id number]

## 2022-11-05 ENCOUNTER — Telehealth: Payer: Self-pay

## 2022-11-05 NOTE — Telephone Encounter (Signed)
Pt contacted nurse to report she wants to wean off of Pramipexole, due to making her sick. She reports taking up to 5 mg and she is so nauseous and dizzy that she can not do anything or function at home.  Informed pt I would update Dr. Jennelle Human with the requested information and get back with her as soon as I could.

## 2022-11-05 NOTE — Telephone Encounter (Signed)
Reduce pramipexole to 1 mg TID now and then reduce by 1/2 tablet every 5 days.

## 2022-11-06 NOTE — Telephone Encounter (Signed)
Informed pt of taper instructions.

## 2022-11-07 ENCOUNTER — Encounter: Payer: Self-pay | Admitting: Family Medicine

## 2022-11-07 ENCOUNTER — Ambulatory Visit: Payer: Managed Care, Other (non HMO) | Admitting: Family Medicine

## 2022-11-07 VITALS — BP 116/60 | HR 70 | Temp 97.9°F | Ht 60.0 in | Wt 84.5 lb

## 2022-11-07 DIAGNOSIS — R030 Elevated blood-pressure reading, without diagnosis of hypertension: Secondary | ICD-10-CM

## 2022-11-07 NOTE — Patient Instructions (Signed)
Let me know if you have not heard about ambulatory BP monitor within one week.

## 2022-11-07 NOTE — Progress Notes (Unsigned)
Established Patient Office Visit  Subjective   Patient ID: Heather Davila, female    DOB: Jul 22, 1960  Age: 62 y.o. MRN: [id number]  Chief Complaint  Patient presents with   Medication Consultation    HPI  {History (Optional):23778} Heather Davila is seen with ongoing issue of up-and-down blood pressures.  We have consistently gotten good readings here but she has had several readings at home that have been up around 150/90 during the past week or 2.  She feels generally very poorly when her blood pressure is up.  She has had occasional headaches and overall fatigue.  No diaphoresis.  No chest pains.  No abdominal pain.  She does take low-dose valsartan.  She is a Engineer, civil (consulting) and has been taking her blood pressure with a manual cuff so she feels like there is good accuracy.  No alcohol use.  No recent nonsteroidal use.  Has been using ketamine medication intranasally through psychiatry but her blood pressures have been up on the days that she has not used this.  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   TRANSAMINASES, SERUM, ELEVATED 08/14/2009   Past Surgical History:  Procedure Laterality Date   AUGMENTATION MAMMAPLASTY Bilateral    BREAST ENHANCEMENT SURGERY Bilateral    BUNIONECTOMY     CESAREAN SECTION     X 3   FACELIFT  2022   HYSTEROSCOPY  12/27/2007   and HTA   UPPER GASTROINTESTINAL ENDOSCOPY  2020    reports that she quit smoking about 35 years ago. Her smoking use included cigarettes. She started smoking about 40 years ago. She has a 2.5 pack-year smoking history. She has never used smokeless tobacco. She reports that she does not drink alcohol and does not use drugs. family history includes Cancer (age of onset: 77) in her mother; Colon cancer (age of onset: 53) in  her mother; Colon polyps in her mother; Depression in her sister; Diabetes in her paternal uncle; Heart attack (age of onset: 55) in her sister; Heart disease in her paternal grandfather and sister; Heart disease (age of onset: 61) in her father; Heart failure in her paternal aunt; Hypertension in her father and mother; Stroke (age of onset: 57) in her father. Allergies  Allergen Reactions   Ciprofloxacin     REACTION: hives   Oxycodone-Acetaminophen     REACTION: hives    Review of Systems  Constitutional:  Positive for malaise/fatigue.  Eyes:  Negative for blurred vision.  Respiratory:  Negative for shortness of breath.   Cardiovascular:  Negative for chest pain.  Neurological:  Positive for headaches. Negative for dizziness and weakness.      Objective:     BP 116/60 (BP Location: Left Arm, Cuff Size: Normal)   Pulse 70   Temp 97.9 F (36.6 C) (Oral)   Ht 5' (1.524 m)   Wt 84 lb 8 oz (38.3 kg)   LMP 12/27/2007 (Exact Date)   SpO2 99%   BMI 16.50 kg/m  {Vitals History (Optional):23777}  Physical Exam Vitals reviewed.  Constitutional:      Appearance: Normal appearance.  Cardiovascular:     Rate and Rhythm: Normal rate and regular rhythm.  Pulmonary:     Effort: Pulmonary effort  is normal.     Breath sounds: Normal breath sounds.  Musculoskeletal:     Right lower leg: No edema.     Left lower leg: No edema.  Neurological:     Mental Status: She is alert.      No results found for any visits on 11/07/22.  Last metabolic panel Lab Results  Component Value Date   GLUCOSE 89 10/27/2022   NA 132 (L) 10/27/2022   K 4.4 10/27/2022   CL 95 (L) 10/27/2022   CO2 30 10/27/2022   BUN 14 10/27/2022   CREATININE 0.67 10/27/2022   GFR 94.92 10/27/2022   CALCIUM 10.1 10/27/2022   PROT 8.3 04/22/2022   ALBUMIN 5.3 (H) 04/22/2022   BILITOT 0.3 04/22/2022   ALKPHOS 74 04/22/2022   AST 82 (H) 04/22/2022   ALT 118 (H) 04/22/2022   ANIONGAP 9 05/01/2014      The  10-year ASCVD risk score (Arnett DK, et al., 2019) is: 4.7%    Assessment & Plan:  Elevated blood pressure by home readings without documented elevation here.  She has had multiple high readings at home with manual cuff and she has long history of nursing so we suspect reliability is probably fairly good.  Not clearly related to any other medications or supplements.  Blood pressure here in office again today checked by me left arm seated 116/60.  -We discussed having her bring in her cuff to compare with ours next visit -Also discussed possible 24-hour ambulatory blood pressure monitoring to get a better handle on this -Doubt secondary etiology but if she continues to have spikes consider urine metanephrines, renin and aldosterone ratio, renal imaging  No follow-ups on file.    [name], MD

## 2022-11-10 ENCOUNTER — Encounter: Payer: Self-pay | Admitting: Psychiatry

## 2022-11-10 ENCOUNTER — Ambulatory Visit (INDEPENDENT_AMBULATORY_CARE_PROVIDER_SITE_OTHER): Payer: 59 | Admitting: Psychiatry

## 2022-11-10 ENCOUNTER — Ambulatory Visit: Payer: 59

## 2022-11-10 VITALS — BP 128/74 | HR 72

## 2022-11-10 DIAGNOSIS — F411 Generalized anxiety disorder: Secondary | ICD-10-CM

## 2022-11-10 DIAGNOSIS — F339 Major depressive disorder, recurrent, unspecified: Secondary | ICD-10-CM

## 2022-11-10 DIAGNOSIS — G4700 Insomnia, unspecified: Secondary | ICD-10-CM | POA: Diagnosis not present

## 2022-11-10 DIAGNOSIS — F900 Attention-deficit hyperactivity disorder, predominantly inattentive type: Secondary | ICD-10-CM

## 2022-11-10 NOTE — Progress Notes (Signed)
Heather Davila [id number] January 12, 1961 62 y.o.  Subjective:   Patient ID:  Heather Davila is a 62 y.o. (DOB August 03, 1960) female.  Chief Complaint:  Chief Complaint  Patient presents with   Follow-up   Depression   Anxiety   Stress   Sleeping Problem    HPI Heather Davila presents to the office today for follow-up of treatment resistant recurrent depression, generalized anxiety disorder and insomnia.  Almost too numerous to count med failures. She is here for Spravato administration for her treatment resistant depression.  05/21/22 appt noted: Patient was administered first dose Spravato 56 mg intranasally today.  The patient experienced the typical dissociation which gradually resolved over the 2-hour period of observation.  There were no complications.  Specifically the patient did not have nausea or vomiting or headache.   Dissociation was mild. Did not experience any emotional relief from disabling depression.wants to increase Spravato to the usual dose.  She is aware insurance is not covering the costs at this time.  No SI acutely. Tolerating meds.    05/23/22 appt noted: Patient was administered Spravato 84 mg intranasally today.  The patient experienced the typical dissociation which gradually resolved over the 2-hour period of observation.  There were no complications.  Specifically the patient did not have nausea or vomiting.   Dissociation was greater than last dose and a little bothersome DT some HA. Tolerating meds.   Mood has not changed significantly thus far with Spravato.  05/27/22 appt noted: Patient was administered Spravato 84 mg intranasally today.  The patient experienced the typical dissociation which gradually resolved over the 2-hour period of observation.  There were no complications.  Specifically the patient did not have nausea or vomiting or headache. Today she has felt a little relief from the pressing heaviness of depression but a little more anxiety.  During  administration of Spravato she listens to [place] music and was able to relax a little more with the feeling of dissociation that was mildly bothersome. Husband was also in on the session today and asked some questions about IV ketamine versus nasal spray ketamine.  05/29/22 appt noted: Cancelled today DT hypertension  05/30/22 appt noted: Patient was administered Spravato 84 mg intranasally today.  The patient experienced the typical dissociation which gradually resolved over the 2-hour period of observation.  There were no complications.  Specifically the patient did not have nausea or vomiting or headache. She is seeing some improvement in mood with less continuous depression and less intensity.  Hopefulness is better.   No med complaints or SE  06/05/22 appt noted: Patient was administered Spravato 84 mg intranasally today.  The patient experienced the typical dissociation which gradually resolved over the 2-hour period of observation.  There were no complications.  Specifically the patient did not have nausea or vomiting or headache.  Specifically HA is less of a problem with Spravato. No SE with meds. Depression is improving with less intensity.  Intermittent anxiety easily.  More able to enjoy things.  No new concerns.  06/17/22 appt noted: Patient was administered Spravato 84 mg intranasally today.  The patient experienced the typical dissociation which gradually resolved over the 2-hour period of observation.  There were no complications.  Specifically the patient did not have nausea or vomiting or headache.  Specifically HA is less of a problem with Spravato. No SE with meds. Depression is improved 45% with less intensity.  Intermittent anxiety less easily.  More able to enjoy things.  No new concerns.  More  active. Current meds: increased clonazepam to about 1.5 mg HS DT recent insomnia, lexapro 20, Dayvigo 10 mg HS, concerta 36 mg AM, trazodone 100 mg HS.  06/20/22 appt noted: Current  meds: increased clonazepam to about 1.5 mg HS DT recent insomnia, lexapro 20, Dayvigo 10 mg HS, concerta 36 mg AM, trazodone 100 mg HS. Patient was administered Spravato 84 mg intranasally today.  The patient experienced the typical dissociation which gradually resolved over the 2-hour period of observation.  There were no complications.  Specifically the patient did not have nausea or vomiting or headache.  Specifically HA is less of a problem with Spravato. No SE with meds. Depression is improved 45% with less intensity.  Intermittent anxiety less easily.  More able to enjoy things.  No new concerns.  More active.  06/23/22 appt noted:  Current meds: increased clonazepam to about 1.5 mg HS DT recent insomnia, lexapro 20, Dayvigo 10 mg HS, concerta 36 mg AM, trazodone 100 mg HS. Patient was administered Spravato 84 mg intranasally today.  The patient experienced the typical dissociation which gradually resolved over the 2-hour period of observation.  There were no complications.  Specifically the patient did not have nausea or vomiting or headache.  Specifically HA is less of a problem with Spravato. No SE with meds. Intensity of Spravato dissociation varies.  Last week very intesne and this week more typical.  Depression is continuing to improve with better interest and activity and motivation.  Gradual improvement.  Wants to continue.  06/25/22 appt noted: Current meds: increased clonazepam to about 1.5 mg HS DT recent insomnia, lexapro 20, Dayvigo 10 mg HS, concerta 36 mg AM, trazodone 100 mg HS. Patient was administered Spravato 84 mg intranasally today.  The patient experienced the typical dissociation which gradually resolved over the 2-hour period of observation.  There were no complications.  Specifically the patient did not have nausea or vomiting or headache.  Specifically HA is less of a problem with Spravato. No SE with meds. Intensity of Spravato dissociation varies.  No scary and well  tolerated. Continues to gradually improve with Spravato.  She is more motivated and enjoying things more.  H sees progress.  Wants to continue twice weekly until gets maximum response.  Dep is not gone yet.  06/30/22 appt noted:  seen with H Current meds: increased clonazepam to about 1.5 mg HS DT recent insomnia, lexapro 20, Dayvigo 10 mg HS, concerta 36 mg AM, trazodone 100 mg HS. Patient was administered Spravato 84 mg intranasally today.  The patient experienced the typical dissociation which gradually resolved over the 2-hour period of observation.  There were no complications.  Specifically the patient did not have nausea or vomiting or headache.  Specifically HA is less of a problem with Spravato. No SE with meds. Intensity of Spravato dissociation varies.  No scary and well tolerated. Continues to gradually improve with Spravato.  She is more motivated and enjoying things more.  H sees even more progress than she does; more active and smiling.  Wants to continue twice weekly until gets maximum response.  Dep is 80% better.  07/07/22 appt noted: Current meds: increased clonazepam to about 1.5 mg HS DT recent insomnia, lexapro 20, Dayvigo 10 mg HS, concerta 36 mg AM, trazodone 100 mg HS. Patient was administered Spravato 84 mg intranasally today.  The patient experienced the typical dissociation which gradually resolved over the 2-hour period of observation.  There were no complications.  Specifically the patient did not have  nausea or vomiting or headache.  Specifically HA is less of a problem with Spravato. No SE with meds. Intensity of Spravato dissociation varies.  No scary and well tolerated.  Was more intese today. Overall mood is markedly better and please with meds. No changes desired.  07/09/22 appt noted: In transition from Lexapro to sertraline and missing Concerta bc CO crash from it after about 4 hours.  Disc these concerns.  She'll discuss also with Corie Chiquito, NP More dep the  last couple of days and concerned about reducing Spravato frequency while transition from one antidep to another.  Affect less positive. Patient was administered Spravato 84 mg intranasally today.  The patient experienced the typical dissociation which gradually resolved over the 2-hour period of observation.  There were no complications.  Specifically the patient did not have nausea or vomiting or headache.  Specifically HA is less of a problem with Spravato. No SE with meds. Intensity of Spravato dissociation varies.  No scary and well tolerated.   07/14/22 appt noted: Has been more depressed this week.  More trouble with sleep.  Taking clonazepam 1-1.5  of the 0.5 mg tablets.   Trazodone not helping much. She switched back from 200 sertraline to Lexapro 20.  No SE More trouble with motivation.  Some stress with son with special needs. Misses the benevit of Concerta but it was too short acting and crashed in 4-6 hours. Patient was administered Spravato 84 mg intranasally today.  The patient experienced the typical dissociation which gradually resolved over the 2-hour period of observation.  There were no complications.  Specifically the patient did not have nausea or vomiting or headache.  Specifically HA is less of a problem with Spravato. No SE with meds. Intensity of Spravato dissociation varies.  No scary and well tolerated.   07/17/22 appt noted: Patient was administered Spravato 84 mg intranasally today.  The patient experienced the typical dissociation which gradually resolved over the 2-hour period of observation.  There were no complications.  Specifically the patient did not have nausea or vomiting or headache.  Specifically HA is less of a problem with Spravato. No SE with meds. Intensity of Spravato dissociation varies.  Not scary and well tolerated.  Just got Jornay last night but wasn't sure how to take it.  Wants to start and this was discussed.  She felt MPH helped mood and attn but  didn't last long enough and had crash after a few hours on Concerta.  Better for 2 hours after each dose Ritalin 20 mg with mood but then it wore off.  BP up after 3rd dose 160/90 and then took H's amlodipine 5 and BP became normal.  07/22/22 appt noted: Patient was administered Spravato 84 mg intranasally today.  The patient experienced the typical dissociation which gradually resolved over the 2-hour period of observation.  There were no complications.  Specifically the patient did not have nausea or vomiting or headache.  Specifically HA is less of a problem with Spravato. No SE with meds. Intensity of Spravato dissociation varies.  Not scary and well tolerated.  Start Jornay 20 mg over the weekend and did not notice any particular effect good or bad.  Increased to 40 mg. Other psych meds: Clonazepam 0.5 mg tablets 2 nightly, gabapentin dosage varies, Dayvigo 10 mg nightly, Lexapro 20 mg daily, to trazodone 100 mg nightly  07/24/22 appt noted: Patient was administered Spravato 84 mg intranasally today.  The patient experienced the typical dissociation which gradually resolved  over the 2-hour period of observation.  There were no complications.  Specifically the patient did not have nausea or vomiting or headache.  Specifically HA is less of a problem with Spravato. No SE with meds. Intensity of Spravato dissociation varies.  Not scary and well tolerated.  Other psych meds: Clonazepam 0.5 mg tablets 2 nightly, gabapentin dosage varies, Dayvigo 10 mg nightly, Lexapro 20 mg daily, to trazodone 100 mg nightly, Jornay 40 mg PM Doing well with Spravato.  Noticed a little effect from the Jornay 40 without SE but would like to increase the dose for ADD and mood.  Sleeping well.  No new concerns.  Still more depressed than she was with th initial benefit of Spravato.  07/30/22 note:  Patient was administered Spravato 84 mg intranasally today.  The patient experienced the typical dissociation which gradually  resolved over the 2-hour period of observation.  There were no complications.  Specifically the patient did not have nausea or vomiting or headache.  Specifically HA is less of a problem with Spravato. Other psych meds: Clonazepam 0.5 mg tablets 2 nightly, gabapentin dosage varies, Dayvigo 10 mg nightly, Lexapro 20 mg daily, to trazodone 100 mg nightly, Jornay 60 mg PM Wants to increase Jornay to 80 mg PM to increase benefit for ADD and depression. No SE She has experienced a major family crisis that threatens to change her daily life from now on.  It has not triggered suicidal thoughts at this time but she is very afraid.  No desire to change medications.  08/04/22 appt noted" Patient was administered Spravato 84 mg intranasally today.  The patient experienced the typical dissociation which gradually resolved over the 2-hour period of observation.  There were no complications.  Specifically the patient did not have nausea or vomiting or headache.  Specifically HA is less of a problem with Spravato. Other psych meds: Clonazepam 0.5 mg tablets 2 nightly, gabapentin dosage varies, Dayvigo 10 mg nightly, Lexapro 20 mg daily, trazodone 100 mg nightly, Jornay 80 mg PM No SE No benefit or SE with increase Jormay 80 mg.  Says Concerta helped ADD and depression but not Jornay.  However duration was insufficient.  Still depressed and wants change to another form of stimulant.  No concerns with other meds. Continues with strong family stressors creating uncertainty about her future but no quick resolution. No SI Trial Cotempla 17.3 and DC Ritalin & Jornay 80  08/11/2022 appointment noted: Patient was administered Spravato 84 mg intranasally today.  The patient experienced the typical dissociation which gradually resolved over the 2-hour period of observation.  There were no complications.  Specifically the patient did not have nausea or vomiting or headache.  Specifically HA is less of a problem with Spravato now  vs when started it.. Other psych meds: Clonazepam 0.5 mg tablets 2 nightly, gabapentin dosage varies, Dayvigo 10 mg nightly, trazodone 100 mg nightly, cotempla 17.3, switch from Lexapro 20 to sertaline 15o mg . No noticeable effects sertraline from for depression but has seen some antianxiety effects from the sertraline.  No side effects noted.  No side effects with the other medicines.  Still is only getting very brief benefit 3 to 4 hours from Cotempla for ADD and mood.  She wants to try the Daytrana patch as we have discussed before. No SE  08/13/22 appt: Patient was administered Spravato 84 mg intranasally today.  The patient experienced the typical dissociation which gradually resolved over the 2-hour period of observation.  There were no  complications.  Specifically the patient did not have nausea or vomiting or headache.  Specifically HA is less of a problem with Spravato now vs when started it.. Other psych meds: Clonazepam 0.5 mg tablets 2 nightly, gabapentin dosage varies, Dayvigo 10 mg nightly, trazodone 100 mg nightly, cotempla 17.3, switch from Lexapro 20 to sertaline 15o mg . No noticeable effects sertraline from for depression but has seen some antianxiety effects from the sertraline.  No side effects noted.  No side effects with the other medicines.  Still is only getting very brief benefit 3 to 4 hours from Cotempla for ADD and mood.  She wants to try the Daytrana patch as we have discussed before.  Hasn't been able to get it yet. No SE Plan.  Continue to try to get Daytrana 30 AM  08/18/22 appt noted: Patient was administered Spravato 84 mg intranasally today.  The patient experienced the typical dissociation which gradually resolved over the 2-hour period of observation.  There were no complications.  Specifically the patient did not have nausea or vomiting or headache.  Specifically HA is less of a problem with Spravato now vs when started it.. Other psych meds: Clonazepam 0.5 mg  tablets 2 nightly, gabapentin dosage varies, Dayvigo 10 mg nightly, trazodone 100 mg nightly, cotempla 17.3, switch from Lexapro 20 to sertaline 15o mg . No noticeable effects sertraline from for depression but has seen some antianxiety effects from the sertraline.  No side effects noted.  No side effects with the other medicines.  Still is only getting very brief benefit 3 to 4 hours from Cotempla for ADD and mood.  She wants to try the Daytrana patch as we have discussed before.  Hasn't been able to get it yet.  Overall depression is some better than it was.   No SE Plan.  Continue to try to get Daytrana 30 AM  08/25/22 appt : No benefit Daytrana.  No SE.  Wants further med changes and interested In retrying pramipexole.   Tolerating meds. Patient was administered Spravato 84 mg intranasally today.  The patient experienced the typical dissociation which gradually resolved over the 2-hour period of observation.  There were no complications.  Specifically the patient did not have nausea or vomiting or headache.  Specifically HA is less of a problem with Spravato now vs when started it.. Other psych meds: Clonazepam 0.5 mg tablets 2 nightly, gabapentin dosage varies, Dayvigo 10 mg nightly, trazodone 100 mg nightly, , switch from Lexapro 20 to sertaline 15o mg . Daytrana 30 AM for a few days. Struggles with depression and no SI.  Reduced interest and mood and motivation and enjoyment and socialization.  08/27/22 appt noted: Psych meds:  Clonazepam 0.5 mg tablets 2 nightly, gabapentin dosage varies, Dayvigo 10 mg nightly, trazodone 100 mg nightly,  sertaline 15o mg . Returned to [employer] AM, pramipexole  Tolerating meds. Patient was administered Spravato 84 mg intranasally today.  The patient experienced the typical dissociation which gradually resolved over the 2-hour period of observation.  There were no complications.  Specifically the patient did not have nausea or vomiting or headache.   Reports  taking cotempla bc brief mood benefit and sig focus and productivity benefit. Took 1 dose pramipexole and felt more dep.  09/01/22 appt noted: Psych meds:  Clonazepam 0.5 mg tablets 2 nightly, gabapentin dosage varies, Dayvigo 10 mg nightly, trazodone 100 mg nightly,  sertaline 15o mg . Returned to Cotempla AM, pramipexole  0.25 mg BID. Tolerating meds now. Patient  was administered Spravato 84 mg intranasally today.  The patient experienced the typical dissociation which gradually resolved over the 2-hour period of observation.  There were no complications.  Specifically the patient did not have nausea or vomiting or headache.   Willing to try the pramipexole and will continue 0.25 mg BID this week and then consider increase if tolerated.  Mood is a little better.  Still looking for further improvement.  09/03/22 appt noted: Psych meds:  Clonazepam 0.5 mg tablets 2 nightly, gabapentin dosage varies, Dayvigo 10 mg nightly, trazodone 100 mg nightly,  sertaline 15o mg . Returned to Cotempla AM, pramipexole  0.25 mg tablet 1 and 1/2 tablets twice daily BID. Tolerating meds now. Patient was administered Spravato 84 mg intranasally today.  The patient experienced the typical dissociation which gradually resolved over the 2-hour period of observation.  There were no complications.  Specifically the patient did not have nausea or vomiting or headache.   Depression may be a little better with the parmipexole and is tolerating it well so far.  Still struggling with focus and residual depression.  Tolerating meds without SE.  More hopeful and able to enjoy some things.  09/08/22 appt  Psych meds:  Clonazepam 0.5 mg tablets 2 nightly, gabapentin dosage varies, Dayvigo 10 mg nightly, trazodone 100 mg nightly,  sertaline 15o mg . Returned to Cotempla AM, pramipexole  0.5 mg BID. Tolerating meds now. Patient was administered Spravato 84 mg intranasally today.  The patient experienced the typical dissociation which  gradually resolved over the 2-hour period of observation.  There were no complications.  Specifically the patient did not have nausea or vomiting or headache.   She has seen her depression drop from a 10/10 prior to treatment with Spravato to now 4/10 With additional benefit noted when she increased the pramipexole to 0.5 mg twice daily.  She is not having side effects with this like she did in the past. Anxiety levels are also improved. She is sleeping okay with the current medications. Plan: continue pramipexole trial off-label and increase more slowly for TRD.  Increase  Gradually  to 0.75 mg BID  09/11/22 appt noted: Psych meds:  Clonazepam 0.5 mg tablets 2 nightly, gabapentin dosage varies, Dayvigo 10 mg nightly, trazodone 100 mg nightly,  sertaline 15o mg . Returned to Cotempla AM, pramipexole  0.75 mg BID. Tolerating meds now. Patient was administered Spravato 84 mg intranasally today.  The patient experienced the typical dissociation which gradually resolved over the 2-hour period of observation.  There were no complications.  Specifically the patient did not have nausea or vomiting or headache.   She has seen her depression drop from a 10/10 prior to treatment with Spravato to now 4/10 With additional benefit noted when she increased the pramipexole to 0.5 mg twice daily.  She is not having side effects with this like she did in the past. Clearly improving with the pramipexole now and tolerating it.  Satisfied with current meds but would consider increasing if it might be helpful.  09/15/22 appt noted: Psych meds:  Clonazepam 0.5 mg tablets 2 nightly, gabapentin dosage varies, Dayvigo 10 mg nightly, trazodone 100 mg nightly,  sertaline 15o mg . Returned to Cotempla AM, pramipexole  0.75 mg BID too anxious and reduced to 0.5 mg BID. Tolerating meds now. Patient was administered Spravato 84 mg intranasally today.  The patient experienced the typical dissociation which gradually resolved over  the 2-hour period of observation.  There were no complications.  Specifically  the patient did not have nausea or vomiting or headache.   Trouble staying asleep longer than 6 hours.  Doesn't feel she can tolerate pramipexole 0.75 mg BID DT anxiety not experienced at  0.5 mg BID.  Otherwise tolerating meds.  Mood is better with Spravato and pramipexole.   Not sleeping as long with meds.  EMA 6 hours.  Wonders about change Disc concerns about waiting for therapy. Feels ready to reduce Spravato to weekly.    09/25/22 appt noted: Psych meds:  Clonazepam 0.5 mg tablets 2 nightly, gabapentin dosage varies, Dayvigo 10 mg nightly, trazodone 100 mg nightly,  sertaline 15o mg . Returned to Cotempla AM, pramipexole  0.75 mg BID too anxious and reduced to 0.5 mg BID. Tolerating meds now. Patient was administered Spravato 84 mg intranasally today.  The patient experienced the typical dissociation which gradually resolved over the 2-hour period of observation.  There were no complications.  Specifically the patient did not have nausea or vomiting or headache.   Trouble staying asleep longer than 6 hours.  Failed to respond to Seroquel 25 mg HS.  Gone back to [facility].  Belsomra NR. Mood ok until the last few days more dep bc it's been 10 days since last admin.  Wants to continue weekly.   No SE.  No med changes desired.  09/29/22 appt noted: Psych meds:  Clonazepam 0.5 mg tablets 2 nightly, gabapentin dosage varies, Dayvigo 10 mg nightly, trazodone 100 mg nightly,  sertaline 15o mg . Returned to Cotempla AM, pramipexole  0.75 mg BID too anxious and reduced to 0.5 mg BID. Tolerating meds now. Patient was administered Spravato 84 mg intranasally today.  The patient experienced the typical dissociation which gradually resolved over the 2-hour period of observation.  There were no complications.  Specifically the patient did not have nausea or vomiting or headache.   Trouble staying asleep longer than 6 hours.  Plan:  Continue sertaline to 200 mg daily.  It has helped anxiety but not depression.   for insomnia, clonazepam 1 mg HS. trazodone doesn't work well but helps some Continue Dayvigo 10 HS.  Failed resp Belsomra 15 and seroquel 25 She wants to continue Cotempla or Concerta bc helps mood early in day and focus  benefit lasts longer.  Have not been able to get stimulant to be consitently effective throughout the day. continue pramipexole trial off-label and reduce back to  TRD 0.5 mg BID Spravato weekly.  10/13/22 appt noted: Not sleeping as well.  Wants to increase Spravato to twice weekly bc feeling more dep close to the next sched date.  Just the day or 2 before.  .  Plan: Trial for off label TRD increase pramipexole 1 mg TID  10/20/22 appt noted: Psych meds:  Clonazepam 0.5 mg tablets 2 nightly, gabapentin dosage varies, Dayvigo 10 mg nightly, trazodone 100 mg nightly,  sertaline 15o mg . Returned to Cotempla AM, pramipexole  1 mg TID. Tolerating meds now. Patient was administered Spravato 84 mg intranasally today.  The patient experienced the typical dissociation which gradually resolved over the 2-hour period of observation.  There were no complications.  Specifically the patient did not have nausea or vomiting or headache.   Trouble staying asleep longer than 6 hours.  More dep this week and asked to get Spravato twice this week. Plan: Trial for off label TRD increase pramipexole 1 mg QID  10/22/22 appt noted:  Psych meds:  Clonazepam 0.5 mg tablets 2 nightly and another one with  EMA, gabapentin dosage varies, Belsomra 20 mg daily,  trazodone 100 mg nightly,  sertaline 15o mg . Returned to Cotempla AM, pramipexole  1 mg QID for 2nd day Tolerating meds now.  Except a little N and dizzy briefly after takes it. Patient was administered Spravato 84 mg intranasally today.  The patient experienced the typical dissociation which gradually resolved over the 2-hour period of observation.  There were no  complications.  Specifically the patient did not have nausea or vomiting or headache. No change in mood yet.   Sleep better with Belsomra 20 as long as takes with other meds.  If insomnia mood is worse. Ongoing some dep but gets partial relief with Spravato but would like better relief .  10/27/22 received Spravato 84  11/03/22 appt noted:  Meds as above except sertraline she cut to 100 mg instead of 200 mg daily She doesn't think sertraline helping and worries over poss SE Off and on Cotempla .  Not affecting BP. Doesn't notice benefit or SE with pramipexole 1 mg QID.  No SE Sleep is OK.  Some awakening aftter 6 hours but not enough so takes another clonazepam.   Patient was administered Spravato 84 mg intranasally today.  The patient experienced the typical dissociation which gradually resolved over the 2-hour period of observation.  There were no complications.  Specifically the patient did not have nausea or vomiting or headache. No change in mood yet.   Sleep better with Belsomra 20 as long as takes with other meds.  If insomnia mood is worse. Ongoing some dep but gets partial relief with Spravato but would like better relief .  No sig benefit with pramipexole 1 mg QID.  11/10/22 appt noted: Weaning pramipexole and weaning bc didn't think it helped. Still dealing with some dep.  Lately still some N and D .   Psych meds: Reduced pramipexole 0.5 mg TID , sertraline 150, Cotempla 17.3 AM Belsomra 20 mg HS, clonazepam 0.5 mg 3 daily, trazodone 100-200 mg HS. Feels she needs all the sleep meds.   Generally more dep, gloomy than anxious.  Dep no pattern.      AIMS    Flowsheet Row Video Visit from 01/03/2022 in [facility]  AIMS Total Score 0      ECT-MADRS    Flowsheet Row Clinical Support from 08/13/2022 in [facility] Video Visit from 04/29/2022 in [facility] Video Visit from 02/06/2022 in [facility]  MADRS Total Score 27 44 23      GAD-7    Flowsheet Row Office Visit from 08/22/2022 in [facility] [place]  Total GAD-7 Score 7      PHQ2-9    Flowsheet Row Office Visit from 08/22/2022 in [facility] [place] Office Visit from 08/19/2022 in [facility] Video Visit from 02/06/2022 in [facility] Office Visit from 11/16/2020 in [facility] [place] Office Visit from 04/19/2018 in [facility] [place]  PHQ-2 Total Score 3 0 6 0 0  PHQ-9 Total Score 5 -- 14 -- 2        Past Psychiatric Medication Trials: Trintellix - Initially effective and then was not effective when re-started Paxil- Ineffective. Helped initially. Prozac-Insomnia Lexapro- Effective and then no longer as effective Zoloft- Initially effective and then not as effective.  Viibryd  Cymbalta- Ineffective. Helped initially. Effexor XR- Effective, well tolerated. Pristiq- Increased anxiety at 150 mg daily Wellbutrin XL-Ineffective.May have increased anxiety. Amitriptyline Nortriptyline-Caused irritability, constipation Auvelity- ineffective  Abilify- Helpful but caused severe insomnia. Rexulti-Helpful but caused insomnia. Vraylar Seroquel - Ineffective Risperdal Olanzapine- Ineffective Latuda- Ineffective Caplyta  Klonopin- Effective Temazepam- Took during menopause Lunesta- Ineffective Ambien-Ineffective Sonata- Ineffective Dayvigo- partially effective  Failed resp Belsomra 15 and seroquel 25 Trazodone-  somewhat effective Remeron-Ineffective. Does not recall wt gain.  Quetiapine 25 NR  Adderall- Took once and had adverse effect. Concerta- Effective but only 4-6 hours Jornay 80 NR Metadate-Not as effective as Concerta Dexmethylphenidate- Not as effective as Concerta Azstarys- some  side effects. Not as effective.  Ritalin short acting and SE anxiety & HTN @20  TID Cotemplay 17.3  Daytrana  Lamictal- May have had an adverse effects. "Felt weird." Reports worsening s/s.  Lithium ? effect Gabapentin Pramipexole- NR at 5 mg daily and SE N and dizzy  ECT #20 with minimal response. H says it was partly helpful.  AIMS    Flowsheet Row Video Visit from 01/03/2022 in [facility]  AIMS Total Score 0      ECT-MADRS    Flowsheet Row Clinical Support from 08/13/2022 in [facility] Video Visit from 04/29/2022 in [facility] Video Visit from 02/06/2022 in [facility]  MADRS Total Score 27 44 23      GAD-7    Flowsheet Row Office Visit from 08/22/2022 in [facility] [place]  Total GAD-7 Score 7      PHQ2-9    Flowsheet Row Office Visit from 08/22/2022 in [facility] [place] Office Visit from 08/19/2022 in [facility] Video Visit from 02/06/2022 in [facility] Office Visit from 11/16/2020 in [facility] [place] [facility] [place] Office Visit from 04/19/2018 in [facility] Total Score 3 0 6 0 0  PHQ-9 Total Score 5 -- 14 -- 2        Review of Systems:  Review of Systems  Cardiovascular:  Negative for palpitations.  Neurological:  Negative for dizziness and tremors.  Psychiatric/Behavioral:  Positive for dysphoric mood and sleep disturbance. Negative for behavioral problems and decreased concentration. The patient is nervous/anxious.     Medications: I have reviewed the patient's current medications.  Current Outpatient Medications  Medication Sig Dispense Refill   clonazePAM (KLONOPIN) 0.5 MG tablet 2 tablets at night and 1 tablet as needed daily for panic anxiety  75 tablet 1   Esketamine HCl, 84 MG Dose, (SPRAVATO, 84 MG DOSE,) 28 MG/DEVICE SOPK USE 3 SPRAYS IN EACH NOSTRIL EVERY 3 DAYS 3 each 1   Estradiol (VAGIFEM) 10 MCG TABS vaginal tablet Place 1 tablet (10 mcg total) vaginally 2 (two) times a week. 24 tablet 3   MAGNESIUM PO Take by mouth.     Methylphenidate (COTEMPLA XR-ODT) 17.3 MG TBED Take 1 tablet (17.3 mg total) by mouth every morning. 30 tablet 0   Multiple Vitamin (MULTIVITAMIN) tablet Take 1 tablet by mouth daily.     pramipexole (MIRAPEX) 1 MG tablet Take 1 tablet (1 mg total) by mouth in the morning, at noon, in the evening, and at bedtime. (Patient taking differently: Take 0.5 mg by mouth 3 (three) times daily. Weaning DT NR) 120 tablet 0   sertraline (ZOLOFT) 100  MG tablet Take 2 tablets (200 mg total) by mouth daily. 180 tablet 0   Suvorexant (BELSOMRA) 20 MG TABS Take 1 tablet (20 mg total) by mouth at bedtime. 30 tablet 0   traZODone (DESYREL) 100 MG tablet Take 100-200 mg by mouth at bedtime.     valACYclovir (VALTREX) 500 MG tablet Take one twice daily for 3 days with any symptoms 30 tablet 3   valsartan (DIOVAN) 80 MG tablet Take 1 tablet (80 mg total) by mouth daily. 90 tablet 3   No current facility-administered medications for this visit.    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   TRANSAMINASES, SERUM, ELEVATED 08/14/2009    Past Medical History, Surgical history, Social history, and Family history were reviewed and updated as appropriate.   Please see review of systems for further details on the patient's review from today.    Objective:   Physical Exam:  LMP 12/27/2007 (Exact Date)   Physical Exam Constitutional:      General: She is not in acute distress. Musculoskeletal:        General: No deformity.  Neurological:     Mental Status: She is alert and oriented to person, place, and time.     Coordination: Coordination normal.  Psychiatric:        Attention and Perception: Attention and perception normal. She does not perceive auditory hallucinations.        Mood and Affect: Mood is anxious and depressed. Affect is not labile or blunt.        Speech: Speech normal. Speech is not slurred.        Behavior: Behavior normal.        Thought Content: Thought content normal. Thought content is not delusional. Thought content does not include homicidal or suicidal ideation. Thought content does not include suicidal plan.        Cognition and Memory: Cognition and memory normal.        Judgment: Judgment normal.     Comments: Insight intact Mood still mod dep  Affect blunted mildly     Lab Review:     Component Value Date/Time   NA 132 (L) 10/27/2022 1107   K 4.4 10/27/2022 1107   CL 95 (L) 10/27/2022 1107   CO2 30 10/27/2022 1107   GLUCOSE 89 10/27/2022 1107   BUN 14 10/27/2022 1107   CREATININE 0.67 10/27/2022 1107   CREATININE 0.68 01/26/2020 0920   CALCIUM 10.1 10/27/2022 1107   PROT 8.3 04/22/2022 0932   ALBUMIN 5.3 (H) 04/22/2022 0932   AST 82 (H) 04/22/2022 0932   ALT 118 (H) 04/22/2022 0932   ALKPHOS 74 04/22/2022 0932   BILITOT 0.3 04/22/2022 0932   GFRNONAA >90 05/01/2014 1645   GFRAA >90 05/01/2014 1645       Component Value Date/Time   WBC 3.3 (L) 04/22/2022 0932   RBC 4.25 04/22/2022 0932   HGB 14.3 04/22/2022 0932   HGB 14.1 10/26/2014 1034   HCT 40.7 04/22/2022 0932   PLT 245.0 04/22/2022  0932   MCV 95.7 04/22/2022 0932   MCH 34.4 (H) 01/26/2020 0920   MCHC 35.1 04/22/2022 0932   RDW 13.2 04/22/2022 0932   LYMPHSABS 0.9 04/22/2022 0932   MONOABS 0.2 04/22/2022 0932    EOSABS 0.1 04/22/2022 0932   BASOSABS 0.0 04/22/2022 0932    No results found for: "POCLITH", "LITHIUM"   No results found for: "PHENYTOIN", "PHENOBARB", "VALPROATE", "CBMZ"   .res Assessment: Plan:    There are no diagnoses linked to this encounter.  Pt seen for TRD and administration of Spravato.   Patient was administered Spravato 84 mg intranasally today.  The patient experienced the typical dissociation which gradually resolved over the 2-hour period of observation.  There were no complications.  Specifically the patient did not have nausea or vomiting. But mild headache.  Blood pressures remained within normal ranges at the 40-minute and 2-hour follow-up intervals.  By the time the 2-hour observation period was met the patient was alert and oriented and able to exit without assistance. See nursing note for further details.Tolerated the Spravato to 84mg  .  Overall is about 50% better with current regimen but would like better resp.  Doesn't think Spravato has helped enough.  Consider switch Spravato to MAOI.  Disc at length.   Disc MAOI options, SE, DDI, food interactions.  She prefers selegiline.  We discussed the short-term risks associated with benzodiazepines including sedation and increased fall risk among others.  Discussed long-term side effect risk including dependence, potential withdrawal symptoms, and the potential eventual dose-related risk of dementia.  But recent studies from 2020 dispute this association between benzodiazepines and dementia risk. Newer studies in 2020 do not support an association with dementia.  Wean sertraline   It has helped anxiety but not depression.   Disc SE in detail and SSRI withdrawal sx.  for insomnia, clonazepam 1 mg HS and 0.5 mg AM.  trazodone doesn't work well but helps some Some better so far with Belsomra 20 mg HS  She wants to continue Cotempla or Concerta bc helps mood early in day and focus  benefit lasts longer.  Have not been  able to get stimulant to be consitently effective throughout the day.  Will hold this when starts selegiline.  Trial for off label TRD increase to max of pramipexole 5 mg daily failed.  Tapering.   Will start selegiline about 7 days off sertraline which will exceed 5 half lives and will start low dose for TRD.  Will not RX until verify she has been   FU after starting selegiline .    Meredith Staggers, MD, DFAPA    Please see After Visit Summary for patient specific instructions.  Future Appointments  Date Time Provider Department Center  11/17/2022  2:00 PM Cottle, Steva Ready., MD [facility] None  11/17/2022  2:00 PM CP-NURSE [facility] None  11/25/2022  1:00 PM Cottle, Steva Ready., MD [facility] None  11/25/2022  1:00 PM CP-NURSE CP-CP None  09/18/2023  8:15 AM Jerene Bears, MD [facility]    No orders of the defined types were placed in this encounter.   ------------------------------- [id number]

## 2022-11-10 NOTE — Progress Notes (Signed)
NURSES NOTE:     Patient arrived for her #43 weekly Spravato treatment. Pt is being treated for Treatment Resistant Depression, the starting dose is 56 mg (2 of the 28 mg) nasal sprays. Pt will be receiving 84 mg (3 of the 28 mg) nasal sprays, this is her maintenance dose. Patient taken to treatment room, I explained and discussed her treatment and how the schedule should go, along with any side effects that may occur. Answered any questions and concerns the patient had. Pt's Spravato is delivered through [place] and she is now billing with her insurance. Spravato medication is stored at doctors office per REMS/FDA guidelines. The medication is required to be locked behind two doors per FDA/REMS Protocol. Medication is also disposed of properly per regulations. All treatments and her vital signs are documented in Spravato REMS per protocol of treatment center regulations.      Pt was starting to get a headache prior to starting her treatment, pt took Ibuprofen that she brought with her. Vital Signs assessed at 2:15 PM 125/69, pulse 65, pulse ox 92%. Instructed pt to blow her nose and recline back slightly to prevent any of medication dripping out of her nose.  Pt. Given 1st dose (28 mg inhaler), then 5 minutes between 2nd dose and 3rd dose. No complaints of nausea/vomiting reported. Pt did listen to music today, she just reclined back and closed her eyes. After 40 minutes I went to assess her vital signs, no side effects or symptoms reported, B/P 117/71, pulse 64. Dr. Jennelle Human talked to pt today about her treatment. Nurse was with pt a total of 60 minutes but pt observed for 120 minutes per protocol. Discharge vital signs were at 4:30 PM, B/P 128/74, pulse 72. She reports dissociation but she was clear upon her discharge. She is scheduled on Monday, July 15th.  She is instructed to contact office if needing anything prior to her next treatment.     LOT # [id number] FEB 2027

## 2022-11-11 ENCOUNTER — Encounter: Payer: Self-pay | Admitting: Family Medicine

## 2022-11-13 ENCOUNTER — Other Ambulatory Visit: Payer: Self-pay | Admitting: Psychiatry

## 2022-11-13 DIAGNOSIS — G4700 Insomnia, unspecified: Secondary | ICD-10-CM

## 2022-11-14 ENCOUNTER — Ambulatory Visit (HOSPITAL_BASED_OUTPATIENT_CLINIC_OR_DEPARTMENT_OTHER): Payer: Managed Care, Other (non HMO) | Admitting: Obstetrics & Gynecology

## 2022-11-17 ENCOUNTER — Encounter: Payer: 59 | Admitting: Psychiatry

## 2022-11-17 ENCOUNTER — Encounter: Payer: Self-pay | Admitting: Family Medicine

## 2022-11-18 ENCOUNTER — Other Ambulatory Visit: Payer: Self-pay | Admitting: Psychiatry

## 2022-11-18 ENCOUNTER — Other Ambulatory Visit (HOSPITAL_BASED_OUTPATIENT_CLINIC_OR_DEPARTMENT_OTHER): Payer: Self-pay

## 2022-11-18 ENCOUNTER — Other Ambulatory Visit: Payer: Self-pay

## 2022-11-18 ENCOUNTER — Telehealth: Payer: Self-pay | Admitting: Psychiatry

## 2022-11-18 DIAGNOSIS — F339 Major depressive disorder, recurrent, unspecified: Secondary | ICD-10-CM

## 2022-11-18 MED ORDER — SELEGILINE HCL 5 MG PO CAPS
ORAL_CAPSULE | ORAL | 0 refills | Status: DC
Start: 2022-11-18 — End: 2022-12-03

## 2022-11-18 NOTE — Progress Notes (Signed)
Starting selegiline for TRD.  Pt off pramipexole, sertraline, trazodone, Cotempla for over 5 half lives.  She is aware of MAOI restrictons.

## 2022-11-18 NOTE — Telephone Encounter (Signed)
Patient notified of message, but also reviewed with her. Told her Rx had been sent.

## 2022-11-18 NOTE — Telephone Encounter (Signed)
[facility] called about the prescription they received for Elderpryl 5mg  capsule.  They don't have the capsule and they have called around and can't find anyone in the area that has it in stock.  They can order it but it will be a few days and Chessie is insisting that she get the medication today.  They have tablets in stock.  If it is ok to swtich to tablets please let them know.  If it needs to be capsule they will order it, but the patient needs to understand she can wait for them to get.  Please let them know what to do.

## 2022-11-19 ENCOUNTER — Ambulatory Visit: Payer: Managed Care, Other (non HMO) | Admitting: Family Medicine

## 2022-11-20 ENCOUNTER — Ambulatory Visit: Payer: 59 | Admitting: Psychiatry

## 2022-11-21 ENCOUNTER — Telehealth: Payer: Self-pay | Admitting: Psychiatry

## 2022-11-21 ENCOUNTER — Ambulatory Visit (HOSPITAL_COMMUNITY)
Admission: EM | Admit: 2022-11-21 | Discharge: 2022-11-21 | Disposition: A | Discharge status: Home / Self Care | Payer: 59

## 2022-11-21 DIAGNOSIS — R45851 Suicidal ideations: Secondary | ICD-10-CM

## 2022-11-21 DIAGNOSIS — F332 Major depressive disorder, recurrent severe without psychotic features: Secondary | ICD-10-CM

## 2022-11-21 NOTE — Telephone Encounter (Signed)
[facility] center called at 11:34.  Heather Davila was there complaining of issues with the Selegiline.  They were wanting to know if she could be seen sooner than her appt 9/4 and/or can she see a different provider that could see her sooner.  I told them she could not be seen by another provider and that I would have the nursing staff reach out to [name] this afternoon.  Please call her later.  They were getting ready to discharge her.

## 2022-11-21 NOTE — Discharge Instructions (Addendum)
Discharge recommendations:   Medications: Patient is to take medications as prescribed. No medications changes were made during your visit today. The patient or patient's guardian is to contact a medical professional and/or outpatient provider to address any new side effects that develop. The patient or the patient's guardian should update outpatient providers of any new medications and/or medication changes.   Outpatient Follow up: Please follow up with Dr. Jennelle Human with [facility] for medication management.   Therapy: We recommend that patient participate in individual therapy or partial hospitalization program to address mental health concerns and depression.   Safety:   The following safety precautions should be taken:   No sharp objects. This includes scissors, razors, scrapers, and putty knives.   Chemicals should be removed and locked up.   Medications should be removed and locked up.   Weapons should be removed and locked up. This includes firearms, knives and instruments that can be used to cause injury.   The patient should abstain from use of illicit substances/drugs and abuse of any medications.  If symptoms worsen or do not continue to improve or if the patient becomes actively suicidal or homicidal then it is recommended that the patient return to the closest hospital emergency department, the [facility], or call 911 for further evaluation and treatment. National Suicide Prevention Lifeline 1-800-SUICIDE or 410-261-5432.  About 988 988 offers 24/7 access to trained crisis counselors who can help people experiencing mental health-related distress. People can call or text 988 or chat 988lifeline.org for themselves or if they are worried about a loved one who may need crisis support.

## 2022-11-21 NOTE — Telephone Encounter (Signed)
Patient is reporting she feels more depressed on selegiline and asking to restart Spravato. She went to [facility]. today.

## 2022-11-21 NOTE — Telephone Encounter (Signed)
TC pt No SE with selegiline but feels more dep.  Wants to stop it. Today is only day 2 of 1 tablet of selegiline 5 mg AM.  Reviewed with her that she had not been satisfied with the response of Spravato and Zoloft and that she is likely feeling worse because the Zoloft is wearing off and in particular because she abruptly stopped 150 mg daily.  She needs to give the selegiline time to work.  She agrees to do so.  She admitted to some passive suicidal thoughts but no active suicidal thoughts and no desire to die.  She agrees to the plan to increase selegiline to 10 mg every morning this weekend and to continue the selegiline trial.  She cannot stop this and immediately start another AD due to risk DDI with serotonin syndrome.  She is aware.  Meredith Staggers, MD, DFAPA
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
## 2022-11-21 NOTE — ED Provider Notes (Signed)
Behavioral Health Urgent Care Medical Screening Exam  Patient Name: Heath Caverly MRN: [id number] Date of Evaluation: 11/21/22 Chief Complaint:   Diagnosis:  Final diagnoses:  Severe episode of recurrent major depressive disorder, without psychotic features (HCC)  Passive suicidal ideations    History of Present illness: Heather Davila is a 62 y.o. female patient with a past psychiatric history significant for recurrent major depression resistant to treatment, insomnia, and anxiety who presents to the [facility] behavioral health urgent care voluntary accompanied by her husband Maquel Petrovich with a chief complaint of worsening depression and passive suicidal ideations.  Patient endorses passive suicidal ideations that she describes as "it's too painful to live." She reports passive suicidal ideations intermittently for the past year, since September 2023. She states that she do want to live. She denies a history of past suicide attempts. She denies a family history of completed suicide. She states that she started feeling worse for the past couple of days. She states that she was doing well when she was on a medication called Spravato. She states that Dr. Jennelle Human at [facility] didn't think she was responding well to the medication and switched her to a new medication. She states that she started taking Selegiline two days ago and doesn't believe she's responding well to the medication. She endorses worsening depressive symptoms of feeling a sense of dooming gloom, decreased appetite, decreased motivation, decreased energy, passive suicidal ideations, and anhedonia. She states that she is over the guilt of her mother dying last year. She reports fair sleep with taking the Belsomra and Klonopin at night. She reports sleeping on average 7-8 hours. She denies using illicit drugs. She resides with her husband and states that her son comes home from college occasionally to visit. She has three children.  She denies access to firearms in the home. She is established with [facility] for medication management and therapy. Her next appointment for medication management is on December 30, 2021. She is scheduled for a new patient appointment with [facility] for therapy on August 31, 24. She denies past inpatient psychiatric hospitalizations. She states that she completed 26 sessions of ECT at [facility] last year and finished January 2024. She denies a family psychiatric history.   On evaluation, patient is alert and oriented x 4. Her thought process is linear and speech is clear and coherent.  Her mood is depressed and affect is congruent. She endorses passive suicidal ideations with no plan or intent.  She identifies protective factors as her significant other, children, desire to live, positive support, no past suicide attempts or family history of completed suicide. She verbally contracts for safety to return home today. She denies homicidal ideations. She denies auditory or visual hallucinations. There is no objective evidence that the patient is currently responding to internal or external stimuli.  Plan: I discussed with the patient inpatient psychiatric hospitalization versus the partial hospitalization program treatment options. Patient verbalizes that she is interested in the [facility] program. A referral was made to the [facility] counselors. Patient requested for this provider to see if she can see her psychiatrist Dr. Jennelle Human at [facility] before September 4. This provider spoke to the receptionist at [facility] in regards to getting a sooner appointment for the patient for medication management. However, I was told that due to the patient's complex case she would need to see Dr. Jennelle Human. No sooner appointments  were available. Patient's next appointment is scheduled for September 4. Per the receptionist, she will have the nurse  reach out to the patient this afternoon to discuss medications. Safety planning  completed prior to discharge. No safety concerns verbalized by the patient's husband Leonette Most. Patient safely discharged to the lobby.   Flowsheet Row ED from 11/21/2022 in [facility]  C-SSRS RISK CATEGORY Low Risk       Psychiatric Specialty Exam  Presentation  General Appearance:Appropriate for Environment  Eye Contact:Fair  Speech:Clear and Coherent  Speech Volume:Normal  Handedness:No data recorded  Mood and Affect  Mood: Depressed  Affect: Congruent   Thought Process  Thought Processes: Coherent; Goal Directed  Descriptions of Associations:Intact  Orientation:Full (Time, Place and Person)  Thought Content:Logical  Diagnosis of Schizophrenia or Schizoaffective disorder in past: No   Hallucinations:None  Ideas of Reference:None  Suicidal Thoughts:Yes, Passive Without Intent; Without Plan; Without Means to Carry Out  Homicidal Thoughts:No   Sensorium  Memory: Immediate Fair; Recent Fair; Remote Fair  Judgment: Fair  Insight: Fair   Chartered certified accountant: Fair  Attention Span: Fair  Recall: [employer] of Knowledge: Fair  Language: Fair   Psychomotor Activity  Psychomotor Activity: Normal   Assets  Assets: Manufacturing systems engineer; Desire for Improvement; Financial Resources/Insurance; Intimacy; Leisure Time; Housing; Physical Health; Resilience; Social Support; Vocational/Educational; Transportation   Sleep  Sleep: Fair  Number of hours:  7   Physical Exam: Physical Exam HENT:     Nose: Nose normal.  Eyes:     Conjunctiva/sclera: Conjunctivae normal.  Cardiovascular:     Rate and Rhythm: Normal rate.  Pulmonary:     Effort: Pulmonary effort is normal.  Musculoskeletal:     Cervical back: Normal range of motion.  Neurological:     Mental Status: She is alert.    Review of Systems  Constitutional: Negative.   HENT: Negative.    Eyes: Negative.   Respiratory: Negative.     Cardiovascular: Negative.   Gastrointestinal: Negative.   Musculoskeletal: Negative.   Neurological: Negative.   Endo/Heme/Allergies: Negative.   Psychiatric/Behavioral:  Positive for depression and suicidal ideas.    Blood pressure 104/73, pulse 67, temperature 98.3 F (36.8 C), temperature source Oral, resp. rate 18, last menstrual period 12/27/2007, SpO2 100%. There is no height or weight on file to calculate BMI.  Musculoskeletal: Strength & Muscle Tone: within normal limits Gait & Station: normal Patient leans: N/A   [facility] MSE Discharge Disposition for Follow up and Recommendations: Based on my evaluation the patient does not appear to have an emergency medical condition and can be discharged with resources and follow up care in outpatient services for Partial Hospitalization Program   Medications: Patient is to take medications as prescribed. No medications changes were made during your visit today. The patient or patient's guardian is to contact a medical professional and/or outpatient provider to address any new side effects that develop. The patient or the patient's guardian should update outpatient providers of any new medications and/or medication changes.   Outpatient Follow up: Please follow up with Dr. Jennelle Human with [facility] for medication management.   Therapy: We recommend that patient participate in individual therapy or partial hospitalization program to address mental health concerns and depression.  Safety:   The following safety precautions should be taken:   No sharp objects. This includes scissors, razors, scrapers, and putty knives.   Chemicals should be removed and locked up.   Medications should be removed and locked up.   Weapons should be removed and locked up. This includes firearms, knives and instruments  that can be used to cause injury.   The patient should abstain from use of illicit substances/drugs and abuse of any medications.  If  symptoms worsen or do not continue to improve or if the patient becomes actively suicidal or homicidal then it is recommended that the patient return to the closest hospital emergency department, the [facility], or call 911 for further evaluation and treatment. National Suicide Prevention Lifeline 1-800-SUICIDE or 804-560-4251.  About 988 988 offers 24/7 access to trained crisis counselors who can help people experiencing mental health-related distress. People can call or text 988 or chat 988lifeline.org for themselves or if they are worried about a loved one who may need crisis support.    Varonica Siharath L, NP 11/21/2022, 11:50 AM
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
## 2022-11-22 NOTE — Telephone Encounter (Signed)
Noted thanks °

## 2022-11-24 ENCOUNTER — Telehealth (HOSPITAL_COMMUNITY): Payer: Self-pay | Admitting: Professional

## 2022-11-24 ENCOUNTER — Telehealth: Payer: Self-pay

## 2022-11-24 NOTE — Telephone Encounter (Signed)
As she noted, she just called Friday.  She doesn't need to call this often.  My answer and recommendation is not different.  Give the selegiline a chance to help.   She knows she can't take Spravato with selegiline.  We can talk about Spravato questions at her next appt.

## 2022-11-24 NOTE — Telephone Encounter (Signed)
Patient notified

## 2022-11-24 NOTE — Telephone Encounter (Signed)
Dr. Jennelle Human,  I know that I just spoke to you on Friday but I've been on the Selegiline now since the 25th. I thought by now I would see a little improvement but I'm not. As the day progresses I start to feel a little worse. Is this normal?  I also was wondering if the Spravato had less of an effect because of the clonazapam that I take every night?  Heather Davila

## 2022-11-25 ENCOUNTER — Ambulatory Visit: Payer: 59 | Admitting: Professional Counselor

## 2022-11-25 ENCOUNTER — Encounter: Payer: 59 | Admitting: Psychiatry

## 2022-11-27 ENCOUNTER — Telehealth: Payer: Self-pay

## 2022-11-27 NOTE — Telephone Encounter (Signed)
MyChart message.   Dr. Jennelle Human, How much time do I give it to see even a little improvement? As of now there is no improvement at all. Heather Davila

## 2022-11-28 ENCOUNTER — Encounter: Payer: Self-pay | Admitting: Family Medicine

## 2022-11-28 NOTE — Telephone Encounter (Addendum)
TC  Really dep.  She has been on selegiline 20 mg daily for 1 week and does not see any improvement.  She might be actually more depressed since stopping Spravato and sertraline.  She is anxious.  Feels like she needs reassurance.She wonders about pursuing IV ketamine. She is not suicidal.  Case was discussed with her previous provider Corie Chiquito, NP She was reminded that she had been informed she was likely to feel worse before she felt better due to stopping the sertraline.  She apparently now feels She was getting some benefit from [facility] as well but she did not realize at the time.  In the opinion of this psychiatrist the MAO inhibitor is still the next logical choice given multiple other failures noted in that she has never taken an MAO inhibitor.  She was reminded that it would take approximately a month to see significant improvement from an antidepressant, once the dose is adequate.  The dosage range with selegiline 5 mg tablets is from 2 to 10 tablets daily approximately.  Therefore it is likely to be several more weeks before she sees improvement.  She is not having significant side effects.  Therefore she can go ahead and increase selegiline from 10 to 15 mg daily and can go up by 5 mg every 5 to 7 days as tolerated towards a moderate dose of 5 tablets daily.  She appreciated the phone call and agreed with the plan.  Meredith Staggers MD, DFAPA

## 2022-12-02 NOTE — Telephone Encounter (Signed)
noted 

## 2022-12-03 ENCOUNTER — Other Ambulatory Visit: Payer: Self-pay | Admitting: Psychiatry

## 2022-12-03 ENCOUNTER — Telehealth: Payer: Self-pay

## 2022-12-03 DIAGNOSIS — F339 Major depressive disorder, recurrent, unspecified: Secondary | ICD-10-CM

## 2022-12-03 MED ORDER — SELEGILINE HCL 5 MG PO CAPS
ORAL_CAPSULE | ORAL | 0 refills | Status: DC
Start: 2022-12-03 — End: 2022-12-03

## 2022-12-03 MED ORDER — SELEGILINE HCL 5 MG PO CAPS
ORAL_CAPSULE | ORAL | 0 refills | Status: DC
Start: 2022-12-03 — End: 2022-12-22

## 2022-12-03 NOTE — Telephone Encounter (Signed)
Patient sent the following message via MyChart. She also called today to ask for a RF of selegiline and asking for something for anxiety. She said the anxiety was not related to the medication but her situation.   Hi Dr. Jennelle Human,  Since I have increased the dosage of Selegiline to 3 capsules a day and will be increasing to 4 capsules next week, can you send in a prescription. I will run out quicker with the increased dosage. [place] I don't want to run out when we are out of town.  Thank you,  Heather Davila

## 2022-12-03 NOTE — Telephone Encounter (Signed)
Patient informed. 

## 2022-12-03 NOTE — Telephone Encounter (Signed)
Sent RX selegiline with new RX for 4 starting next week. She has clonazepam and can increase if necessary to 1 mg BID or 0.5 mg AM and noon and 1 mg HS.  She has 0.5 mg tablets.

## 2022-12-19 ENCOUNTER — Ambulatory Visit (INDEPENDENT_AMBULATORY_CARE_PROVIDER_SITE_OTHER): Payer: 59 | Admitting: Professional Counselor

## 2022-12-19 ENCOUNTER — Telehealth: Payer: Self-pay | Admitting: Psychiatry

## 2022-12-19 ENCOUNTER — Encounter: Payer: Self-pay | Admitting: Professional Counselor

## 2022-12-19 DIAGNOSIS — F330 Major depressive disorder, recurrent, mild: Secondary | ICD-10-CM

## 2022-12-19 DIAGNOSIS — F419 Anxiety disorder, unspecified: Secondary | ICD-10-CM | POA: Diagnosis not present
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
## 2022-12-19 NOTE — Telephone Encounter (Signed)
No need to call.  This is not urgent.  I will address at appt Monday

## 2022-12-19 NOTE — Telephone Encounter (Signed)
Please see message from patient. She does have FU with you on Monday.

## 2022-12-19 NOTE — Telephone Encounter (Signed)
Pt called at 1:42p.  She said she wanted to know if she could start weaning off the Selegiline.  She thinks increasing the dosage is making things worse.  She said she wants to go back to [employer].  She acknowleged she has an appt on Monday. I advised her that I wasn't sure Dr Jennelle Human will see her message before her appt, but she still ask me to send the message.  Next appt 8/26

## 2022-12-19 NOTE — Telephone Encounter (Signed)
Pt called at ***

## 2022-12-22 ENCOUNTER — Other Ambulatory Visit: Payer: Self-pay

## 2022-12-22 ENCOUNTER — Ambulatory Visit (INDEPENDENT_AMBULATORY_CARE_PROVIDER_SITE_OTHER): Payer: 59 | Admitting: Psychiatry

## 2022-12-22 ENCOUNTER — Encounter: Payer: Self-pay | Admitting: Psychiatry

## 2022-12-22 DIAGNOSIS — F339 Major depressive disorder, recurrent, unspecified: Secondary | ICD-10-CM

## 2022-12-22 DIAGNOSIS — F900 Attention-deficit hyperactivity disorder, predominantly inattentive type: Secondary | ICD-10-CM | POA: Diagnosis not present

## 2022-12-22 DIAGNOSIS — F411 Generalized anxiety disorder: Secondary | ICD-10-CM

## 2022-12-22 DIAGNOSIS — G4700 Insomnia, unspecified: Secondary | ICD-10-CM

## 2022-12-22 MED ORDER — VENLAFAXINE HCL ER 37.5 MG PO CP24
ORAL_CAPSULE | ORAL | 0 refills | Status: DC
Start: 2022-12-22 — End: 2023-01-07

## 2022-12-22 MED ORDER — SPRAVATO (84 MG DOSE) 28 MG/DEVICE NA SOPK: 84.0000 mg | PACK | NASAL | 1 refills | Status: DC

## 2022-12-22 NOTE — Progress Notes (Unsigned)
Heather Davila [id number] 11-11-1960 62 y.o.  Subjective:   Patient ID:  Heather Davila is a 62 y.o. (DOB Dec 23, 1960) female.  Chief Complaint:  No chief complaint on file.   HPI Heather Davila presents to the office today for follow-up of treatment resistant recurrent depression, generalized anxiety disorder and insomnia.  Almost too numerous to count med failures. She is here for Spravato administration for her treatment resistant depression.  05/21/22 appt noted: Patient was administered first dose Spravato 56 mg intranasally today.  The patient experienced the typical dissociation which gradually resolved over the 2-hour period of observation.  There were no complications.  Specifically the patient did not have nausea or vomiting or headache.   Dissociation was mild. Did not experience any emotional relief from disabling depression.wants to increase Spravato to the usual dose.  She is aware insurance is not covering the costs at this time.  No SI acutely. Tolerating meds.    05/23/22 appt noted: Patient was administered Spravato 84 mg intranasally today.  The patient experienced the typical dissociation which gradually resolved over the 2-hour period of observation.  There were no complications.  Specifically the patient did not have nausea or vomiting.   Dissociation was greater than last dose and a little bothersome DT some HA. Tolerating meds.   Mood has not changed significantly thus far with Spravato.  05/27/22 appt noted: Patient was administered Spravato 84 mg intranasally today.  The patient experienced the typical dissociation which gradually resolved over the 2-hour period of observation.  There were no complications.  Specifically the patient did not have nausea or vomiting or headache. Today she has felt a little relief from the pressing heaviness of depression but a little more anxiety.  During administration of Spravato she listens to Heather Davila music and was able to relax a  little more with the feeling of dissociation that was mildly bothersome. Husband was also in on the session today and asked some questions about IV ketamine versus nasal spray ketamine.  05/29/22 appt noted: Cancelled today DT hypertension  05/30/22 appt noted: Patient was administered Spravato 84 mg intranasally today.  The patient experienced the typical dissociation which gradually resolved over the 2-hour period of observation.  There were no complications.  Specifically the patient did not have nausea or vomiting or headache. She is seeing some improvement in mood with less continuous depression and less intensity.  Hopefulness is better.   No med complaints or SE  06/05/22 appt noted: Patient was administered Spravato 84 mg intranasally today.  The patient experienced the typical dissociation which gradually resolved over the 2-hour period of observation.  There were no complications.  Specifically the patient did not have nausea or vomiting or headache.  Specifically HA is less of a problem with Spravato. No SE with meds. Depression is improving with less intensity.  Intermittent anxiety easily.  More able to enjoy things.  No new concerns.  06/17/22 appt noted: Patient was administered Spravato 84 mg intranasally today.  The patient experienced the typical dissociation which gradually resolved over the 2-hour period of observation.  There were no complications.  Specifically the patient did not have nausea or vomiting or headache.  Specifically HA is less of a problem with Spravato. No SE with meds. Depression is improved 45% with less intensity.  Intermittent anxiety less easily.  More able to enjoy things.  No new concerns.  More active. Current meds: increased clonazepam to about 1.5 mg HS DT recent insomnia, lexapro 20, Dayvigo 10 mg  HS, concerta 36 mg AM, trazodone 100 mg HS.  06/20/22 appt noted: Current meds: increased clonazepam to about 1.5 mg HS DT recent insomnia, lexapro 20,  Dayvigo 10 mg HS, concerta 36 mg AM, trazodone 100 mg HS. Patient was administered Spravato 84 mg intranasally today.  The patient experienced the typical dissociation which gradually resolved over the 2-hour period of observation.  There were no complications.  Specifically the patient did not have nausea or vomiting or headache.  Specifically HA is less of a problem with Spravato. No SE with meds. Depression is improved 45% with less intensity.  Intermittent anxiety less easily.  More able to enjoy things.  No new concerns.  More active.  06/23/22 appt noted:  Current meds: increased clonazepam to about 1.5 mg HS DT recent insomnia, lexapro 20, Dayvigo 10 mg HS, concerta 36 mg AM, trazodone 100 mg HS. Patient was administered Spravato 84 mg intranasally today.  The patient experienced the typical dissociation which gradually resolved over the 2-hour period of observation.  There were no complications.  Specifically the patient did not have nausea or vomiting or headache.  Specifically HA is less of a problem with Spravato. No SE with meds. Intensity of Spravato dissociation varies.  Last week very intesne and this week more typical.  Depression is continuing to improve with better interest and activity and motivation.  Gradual improvement.  Wants to continue.  06/25/22 appt noted: Current meds: increased clonazepam to about 1.5 mg HS DT recent insomnia, lexapro 20, Dayvigo 10 mg HS, concerta 36 mg AM, trazodone 100 mg HS. Patient was administered Spravato 84 mg intranasally today.  The patient experienced the typical dissociation which gradually resolved over the 2-hour period of observation.  There were no complications.  Specifically the patient did not have nausea or vomiting or headache.  Specifically HA is less of a problem with Spravato. No SE with meds. Intensity of Spravato dissociation varies.  No scary and well tolerated. Continues to gradually improve with Spravato.  She is more motivated  and enjoying things more.  H sees progress.  Wants to continue twice weekly until gets maximum response.  Dep is not gone yet.  06/30/22 appt noted:  seen with H Current meds: increased clonazepam to about 1.5 mg HS DT recent insomnia, lexapro 20, Dayvigo 10 mg HS, concerta 36 mg AM, trazodone 100 mg HS. Patient was administered Spravato 84 mg intranasally today.  The patient experienced the typical dissociation which gradually resolved over the 2-hour period of observation.  There were no complications.  Specifically the patient did not have nausea or vomiting or headache.  Specifically HA is less of a problem with Spravato. No SE with meds. Intensity of Spravato dissociation varies.  No scary and well tolerated. Continues to gradually improve with Spravato.  She is more motivated and enjoying things more.  H sees even more progress than she does; more active and smiling.  Wants to continue twice weekly until gets maximum response.  Dep is 80% better.  07/07/22 appt noted: Current meds: increased clonazepam to about 1.5 mg HS DT recent insomnia, lexapro 20, Dayvigo 10 mg HS, concerta 36 mg AM, trazodone 100 mg HS. Patient was administered Spravato 84 mg intranasally today.  The patient experienced the typical dissociation which gradually resolved over the 2-hour period of observation.  There were no complications.  Specifically the patient did not have nausea or vomiting or headache.  Specifically HA is less of a problem with Spravato. No SE with  meds. Intensity of Spravato dissociation varies.  No scary and well tolerated.  Was more intese today. Overall mood is markedly better and please with meds. No changes desired.  07/09/22 appt noted: In transition from Lexapro to sertraline and missing Concerta bc CO crash from it after about 4 hours.  Disc these concerns.  She'll discuss also with Corie Chiquito, NP More dep the last couple of days and concerned about reducing Spravato frequency while  transition from one antidep to another.  Affect less positive. Patient was administered Spravato 84 mg intranasally today.  The patient experienced the typical dissociation which gradually resolved over the 2-hour period of observation.  There were no complications.  Specifically the patient did not have nausea or vomiting or headache.  Specifically HA is less of a problem with Spravato. No SE with meds. Intensity of Spravato dissociation varies.  No scary and well tolerated.   07/14/22 appt noted: Has been more depressed this week.  More trouble with sleep.  Taking clonazepam 1-1.5  of the 0.5 mg tablets.   Trazodone not helping much. She switched back from 200 sertraline to Lexapro 20.  No SE More trouble with motivation.  Some stress with son with special needs. Misses the benevit of Concerta but it was too short acting and crashed in 4-6 hours. Patient was administered Spravato 84 mg intranasally today.  The patient experienced the typical dissociation which gradually resolved over the 2-hour period of observation.  There were no complications.  Specifically the patient did not have nausea or vomiting or headache.  Specifically HA is less of a problem with Spravato. No SE with meds. Intensity of Spravato dissociation varies.  No scary and well tolerated.   07/17/22 appt noted: Patient was administered Spravato 84 mg intranasally today.  The patient experienced the typical dissociation which gradually resolved over the 2-hour period of observation.  There were no complications.  Specifically the patient did not have nausea or vomiting or headache.  Specifically HA is less of a problem with Spravato. No SE with meds. Intensity of Spravato dissociation varies.  Not scary and well tolerated.  Just got Jornay last night but wasn't sure how to take it.  Wants to start and this was discussed.  She felt MPH helped mood and attn but didn't last long enough and had crash after a few hours on Concerta.   Better for 2 hours after each dose Ritalin 20 mg with mood but then it wore off.  BP up after 3rd dose 160/90 and then took H's amlodipine 5 and BP became normal.  07/22/22 appt noted: Patient was administered Spravato 84 mg intranasally today.  The patient experienced the typical dissociation which gradually resolved over the 2-hour period of observation.  There were no complications.  Specifically the patient did not have nausea or vomiting or headache.  Specifically HA is less of a problem with Spravato. No SE with meds. Intensity of Spravato dissociation varies.  Not scary and well tolerated.  Start Jornay 20 mg over the weekend and did not notice any particular effect good or bad.  Increased to 40 mg. Other psych meds: Clonazepam 0.5 mg tablets 2 nightly, gabapentin dosage varies, Dayvigo 10 mg nightly, Lexapro 20 mg daily, to trazodone 100 mg nightly  07/24/22 appt noted: Patient was administered Spravato 84 mg intranasally today.  The patient experienced the typical dissociation which gradually resolved over the 2-hour period of observation.  There were no complications.  Specifically the patient did not have  nausea or vomiting or headache.  Specifically HA is less of a problem with Spravato. No SE with meds. Intensity of Spravato dissociation varies.  Not scary and well tolerated.  Other psych meds: Clonazepam 0.5 mg tablets 2 nightly, gabapentin dosage varies, Dayvigo 10 mg nightly, Lexapro 20 mg daily, to trazodone 100 mg nightly, Jornay 40 mg PM Doing well with Spravato.  Noticed a little effect from the Jornay 40 without SE but would like to increase the dose for ADD and mood.  Sleeping well.  No new concerns.  Still more depressed than she was with th initial benefit of Spravato.  07/30/22 note:  Patient was administered Spravato 84 mg intranasally today.  The patient experienced the typical dissociation which gradually resolved over the 2-hour period of observation.  There were no  complications.  Specifically the patient did not have nausea or vomiting or headache.  Specifically HA is less of a problem with Spravato. Other psych meds: Clonazepam 0.5 mg tablets 2 nightly, gabapentin dosage varies, Dayvigo 10 mg nightly, Lexapro 20 mg daily, to trazodone 100 mg nightly, Jornay 60 mg PM Wants to increase Jornay to 80 mg PM to increase benefit for ADD and depression. No SE She has experienced a major family crisis that threatens to change her daily life from now on.  It has not triggered suicidal thoughts at this time but she is very afraid.  No desire to change medications.  08/04/22 appt noted" Patient was administered Spravato 84 mg intranasally today.  The patient experienced the typical dissociation which gradually resolved over the 2-hour period of observation.  There were no complications.  Specifically the patient did not have nausea or vomiting or headache.  Specifically HA is less of a problem with Spravato. Other psych meds: Clonazepam 0.5 mg tablets 2 nightly, gabapentin dosage varies, Dayvigo 10 mg nightly, Lexapro 20 mg daily, trazodone 100 mg nightly, Jornay 80 mg PM No SE No benefit or SE with increase Jormay 80 mg.  Says Concerta helped ADD and depression but not Jornay.  However duration was insufficient.  Still depressed and wants change to another form of stimulant.  No concerns with other meds. Continues with strong family stressors creating uncertainty about her future but no quick resolution. No SI Trial Cotempla 17.3 and DC Ritalin & Jornay 80  08/11/2022 appointment noted: Patient was administered Spravato 84 mg intranasally today.  The patient experienced the typical dissociation which gradually resolved over the 2-hour period of observation.  There were no complications.  Specifically the patient did not have nausea or vomiting or headache.  Specifically HA is less of a problem with Spravato now vs when started it.. Other psych meds: Clonazepam 0.5 mg  tablets 2 nightly, gabapentin dosage varies, Dayvigo 10 mg nightly, trazodone 100 mg nightly, cotempla 17.3, switch from Lexapro 20 to sertaline 15o mg . No noticeable effects sertraline from for depression but has seen some antianxiety effects from the sertraline.  No side effects noted.  No side effects with the other medicines.  Still is only getting very brief benefit 3 to 4 hours from Cotempla for ADD and mood.  She wants to try the Daytrana patch as we have discussed before. No SE  08/13/22 appt: Patient was administered Spravato 84 mg intranasally today.  The patient experienced the typical dissociation which gradually resolved over the 2-hour period of observation.  There were no complications.  Specifically the patient did not have nausea or vomiting or headache.  Specifically HA is less  of a problem with Spravato now vs when started it.. Other psych meds: Clonazepam 0.5 mg tablets 2 nightly, gabapentin dosage varies, Dayvigo 10 mg nightly, trazodone 100 mg nightly, cotempla 17.3, switch from Lexapro 20 to sertaline 15o mg . No noticeable effects sertraline from for depression but has seen some antianxiety effects from the sertraline.  No side effects noted.  No side effects with the other medicines.  Still is only getting very brief benefit 3 to 4 hours from Cotempla for ADD and mood.  She wants to try the Daytrana patch as we have discussed before.  Hasn't been able to get it yet. No SE Plan.  Continue to try to get Daytrana 30 AM  08/18/22 appt noted: Patient was administered Spravato 84 mg intranasally today.  The patient experienced the typical dissociation which gradually resolved over the 2-hour period of observation.  There were no complications.  Specifically the patient did not have nausea or vomiting or headache.  Specifically HA is less of a problem with Spravato now vs when started it.. Other psych meds: Clonazepam 0.5 mg tablets 2 nightly, gabapentin dosage varies, Dayvigo 10 mg  nightly, trazodone 100 mg nightly, cotempla 17.3, switch from Lexapro 20 to sertaline 15o mg . No noticeable effects sertraline from for depression but has seen some antianxiety effects from the sertraline.  No side effects noted.  No side effects with the other medicines.  Still is only getting very brief benefit 3 to 4 hours from Cotempla for ADD and mood.  She wants to try the Daytrana patch as we have discussed before.  Hasn't been able to get it yet.  Overall depression is some better than it was.   No SE Plan.  Continue to try to get Daytrana 30 AM  08/25/22 appt : No benefit Daytrana.  No SE.  Wants further med changes and interested In retrying pramipexole.   Tolerating meds. Patient was administered Spravato 84 mg intranasally today.  The patient experienced the typical dissociation which gradually resolved over the 2-hour period of observation.  There were no complications.  Specifically the patient did not have nausea or vomiting or headache.  Specifically HA is less of a problem with Spravato now vs when started it.. Other psych meds: Clonazepam 0.5 mg tablets 2 nightly, gabapentin dosage varies, Dayvigo 10 mg nightly, trazodone 100 mg nightly, , switch from Lexapro 20 to sertaline 15o mg . Daytrana 30 AM for a few days. Struggles with depression and no SI.  Reduced interest and mood and motivation and enjoyment and socialization.  08/27/22 appt noted: Psych meds:  Clonazepam 0.5 mg tablets 2 nightly, gabapentin dosage varies, Dayvigo 10 mg nightly, trazodone 100 mg nightly,  sertaline 15o mg . Returned to [employer] AM, pramipexole  Tolerating meds. Patient was administered Spravato 84 mg intranasally today.  The patient experienced the typical dissociation which gradually resolved over the 2-hour period of observation.  There were no complications.  Specifically the patient did not have nausea or vomiting or headache.   Reports taking cotempla bc brief mood benefit and sig focus and  productivity benefit. Took 1 dose pramipexole and felt more dep.  09/01/22 appt noted: Psych meds:  Clonazepam 0.5 mg tablets 2 nightly, gabapentin dosage varies, Dayvigo 10 mg nightly, trazodone 100 mg nightly,  sertaline 15o mg . Returned to Cotempla AM, pramipexole  0.25 mg BID. Tolerating meds now. Patient was administered Spravato 84 mg intranasally today.  The patient experienced the typical dissociation which gradually resolved over  the 2-hour period of observation.  There were no complications.  Specifically the patient did not have nausea or vomiting or headache.   Willing to try the pramipexole and will continue 0.25 mg BID this week and then consider increase if tolerated.  Mood is a little better.  Still looking for further improvement.  09/03/22 appt noted: Psych meds:  Clonazepam 0.5 mg tablets 2 nightly, gabapentin dosage varies, Dayvigo 10 mg nightly, trazodone 100 mg nightly,  sertaline 15o mg . Returned to Cotempla AM, pramipexole  0.25 mg tablet 1 and 1/2 tablets twice daily BID. Tolerating meds now. Patient was administered Spravato 84 mg intranasally today.  The patient experienced the typical dissociation which gradually resolved over the 2-hour period of observation.  There were no complications.  Specifically the patient did not have nausea or vomiting or headache.   Depression may be a little better with the parmipexole and is tolerating it well so far.  Still struggling with focus and residual depression.  Tolerating meds without SE.  More hopeful and able to enjoy some things.  09/08/22 appt  Psych meds:  Clonazepam 0.5 mg tablets 2 nightly, gabapentin dosage varies, Dayvigo 10 mg nightly, trazodone 100 mg nightly,  sertaline 15o mg . Returned to Cotempla AM, pramipexole  0.5 mg BID. Tolerating meds now. Patient was administered Spravato 84 mg intranasally today.  The patient experienced the typical dissociation which gradually resolved over the 2-hour period of observation.   There were no complications.  Specifically the patient did not have nausea or vomiting or headache.   She has seen her depression drop from a 10/10 prior to treatment with Spravato to now 4/10 With additional benefit noted when she increased the pramipexole to 0.5 mg twice daily.  She is not having side effects with this like she did in the past. Anxiety levels are also improved. She is sleeping okay with the current medications. Plan: continue pramipexole trial off-label and increase more slowly for TRD.  Increase  Gradually  to 0.75 mg BID  09/11/22 appt noted: Psych meds:  Clonazepam 0.5 mg tablets 2 nightly, gabapentin dosage varies, Dayvigo 10 mg nightly, trazodone 100 mg nightly,  sertaline 15o mg . Returned to Cotempla AM, pramipexole  0.75 mg BID. Tolerating meds now. Patient was administered Spravato 84 mg intranasally today.  The patient experienced the typical dissociation which gradually resolved over the 2-hour period of observation.  There were no complications.  Specifically the patient did not have nausea or vomiting or headache.   She has seen her depression drop from a 10/10 prior to treatment with Spravato to now 4/10 With additional benefit noted when she increased the pramipexole to 0.5 mg twice daily.  She is not having side effects with this like she did in the past. Clearly improving with the pramipexole now and tolerating it.  Satisfied with current meds but would consider increasing if it might be helpful.  09/15/22 appt noted: Psych meds:  Clonazepam 0.5 mg tablets 2 nightly, gabapentin dosage varies, Dayvigo 10 mg nightly, trazodone 100 mg nightly,  sertaline 15o mg . Returned to Cotempla AM, pramipexole  0.75 mg BID too anxious and reduced to 0.5 mg BID. Tolerating meds now. Patient was administered Spravato 84 mg intranasally today.  The patient experienced the typical dissociation which gradually resolved over the 2-hour period of observation.  There were no  complications.  Specifically the patient did not have nausea or vomiting or headache.   Trouble staying asleep longer than 6  hours.  Doesn't feel she can tolerate pramipexole 0.75 mg BID DT anxiety not experienced at  0.5 mg BID.  Otherwise tolerating meds.  Mood is better with Spravato and pramipexole.   Not sleeping as long with meds.  EMA 6 hours.  Wonders about change Disc concerns about waiting for therapy. Feels ready to reduce Spravato to weekly.    09/25/22 appt noted: Psych meds:  Clonazepam 0.5 mg tablets 2 nightly, gabapentin dosage varies, Dayvigo 10 mg nightly, trazodone 100 mg nightly,  sertaline 15o mg . Returned to Cotempla AM, pramipexole  0.75 mg BID too anxious and reduced to 0.5 mg BID. Tolerating meds now. Patient was administered Spravato 84 mg intranasally today.  The patient experienced the typical dissociation which gradually resolved over the 2-hour period of observation.  There were no complications.  Specifically the patient did not have nausea or vomiting or headache.   Trouble staying asleep longer than 6 hours.  Failed to respond to Seroquel 25 mg HS.  Gone back to [facility].  Belsomra NR. Mood ok until the last few days more dep bc it's been 10 days since last admin.  Wants to continue weekly.   No SE.  No med changes desired.  09/29/22 appt noted: Psych meds:  Clonazepam 0.5 mg tablets 2 nightly, gabapentin dosage varies, Dayvigo 10 mg nightly, trazodone 100 mg nightly,  sertaline 15o mg . Returned to Cotempla AM, pramipexole  0.75 mg BID too anxious and reduced to 0.5 mg BID. Tolerating meds now. Patient was administered Spravato 84 mg intranasally today.  The patient experienced the typical dissociation which gradually resolved over the 2-hour period of observation.  There were no complications.  Specifically the patient did not have nausea or vomiting or headache.   Trouble staying asleep longer than 6 hours.  Plan: Continue sertaline to 200 mg daily.  It has  helped anxiety but not depression.   for insomnia, clonazepam 1 mg HS. trazodone doesn't work well but helps some Continue Dayvigo 10 HS.  Failed resp Belsomra 15 and seroquel 25 She wants to continue Cotempla or Concerta bc helps mood early in day and focus  benefit lasts longer.  Have not been able to get stimulant to be consitently effective throughout the day. continue pramipexole trial off-label and reduce back to  TRD 0.5 mg BID Spravato weekly.  10/13/22 appt noted: Not sleeping as well.  Wants to increase Spravato to twice weekly bc feeling more dep close to the next sched date.  Just the day or 2 before.  .  Plan: Trial for off label TRD increase pramipexole 1 mg TID  10/20/22 appt noted: Psych meds:  Clonazepam 0.5 mg tablets 2 nightly, gabapentin dosage varies, Dayvigo 10 mg nightly, trazodone 100 mg nightly,  sertaline 15o mg . Returned to Cotempla AM, pramipexole  1 mg TID. Tolerating meds now. Patient was administered Spravato 84 mg intranasally today.  The patient experienced the typical dissociation which gradually resolved over the 2-hour period of observation.  There were no complications.  Specifically the patient did not have nausea or vomiting or headache.   Trouble staying asleep longer than 6 hours.  More dep this week and asked to get Spravato twice this week. Plan: Trial for off label TRD increase pramipexole 1 mg QID  10/22/22 appt noted:  Psych meds:  Clonazepam 0.5 mg tablets 2 nightly and another one with EMA, gabapentin dosage varies, Belsomra 20 mg daily,  trazodone 100 mg nightly,  sertaline 15o mg .  Returned to Cotempla AM, pramipexole  1 mg QID for 2nd day Tolerating meds now.  Except a little N and dizzy briefly after takes it. Patient was administered Spravato 84 mg intranasally today.  The patient experienced the typical dissociation which gradually resolved over the 2-hour period of observation.  There were no complications.  Specifically the patient did not  have nausea or vomiting or headache. No change in mood yet.   Sleep better with Belsomra 20 as long as takes with other meds.  If insomnia mood is worse. Ongoing some dep but gets partial relief with Spravato but would like better relief .  10/27/22 received Spravato 84  11/03/22 appt noted:  Meds as above except sertraline she cut to 100 mg instead of 200 mg daily She doesn't think sertraline helping and worries over poss SE Off and on Cotempla .  Not affecting BP. Doesn't notice benefit or SE with pramipexole 1 mg QID.  No SE Sleep is OK.  Some awakening aftter 6 hours but not enough so takes another clonazepam.   Patient was administered Spravato 84 mg intranasally today.  The patient experienced the typical dissociation which gradually resolved over the 2-hour period of observation.  There were no complications.  Specifically the patient did not have nausea or vomiting or headache. No change in mood yet.   Sleep better with Belsomra 20 as long as takes with other meds.  If insomnia mood is worse. Ongoing some dep but gets partial relief with Spravato but would like better relief .  No sig benefit with pramipexole 1 mg QID.  11/21/22 TC:  To ER with dep 11/21/22. No SE with selegiline but feels more dep.  Wants to stop it. Today is only day 2 of 1 tablet of selegiline 5 mg AM.  Reviewed with her that she had not been satisfied with the response of Spravato and Zoloft and that she is likely feeling worse because the Zoloft is wearing off and in particular because she abruptly stopped 150 mg daily.  She needs to give the selegiline time to work.  She agrees to do so.  She admitted to some passive suicidal thoughts but no active suicidal thoughts and no desire to die.  She agrees to the plan to increase selegiline to 10 mg every morning this weekend and to continue the selegiline trial.  She cannot stop this and immediately start another AD due to risk DDI with serotonin syndrome.  She is aware.   Meredith Staggers, MD, DFAPA  12/19/22 TC: Pt called at 1:42p.  She said she wanted to know if she could start weaning off the Selegiline.  She thinks increasing the dosage is making things worse.  She said she wants to go back to [employer].  She acknowleged she has an appt on Monday. I advised her that I wasn't sure Dr Jennelle Human will see her message before her appt, but she still ask me to send the message.  Next appt 8/26 MD resp.  No change until Monday appt.  12/22/22 appt noted: Meds: selegiline up to 5 mg 4 daily for a week but felt it incr irritability and went back to 3 mg daily. She wants to go back to Spravato.  Not near as dep as now.  Selegiline didn't help energy. Still on Belsomra Thinks maybe sertraline helped mood more than she thought. More dep than anxiety.      AIMS    Flowsheet Row Video Visit from 01/03/2022 in [facility]  Psychiatric Group  AIMS Total Score 0      ECT-MADRS    Flowsheet Row Clinical Support from 08/13/2022 in [facility] Video Visit from 04/29/2022 in [facility] Video Visit from 02/06/2022 in [facility]  MADRS Total Score 27 44 23      GAD-7    Flowsheet Row Counselor from 12/19/2022 in [facility] Office Visit from 08/22/2022 in [facility] [place]  Total GAD-7 Score 4 7      PHQ2-9    Flowsheet Row Counselor from 12/19/2022 in [facility] Office Visit from 08/22/2022 in [facility] [place] Office Visit from 08/19/2022 in [facility] Healthcare at [facility] Video Visit from 02/06/2022 in [facility] Office Visit from 11/16/2020 in [facility] at [facility]  PHQ-2 Total Score 2 3 0 6 0  PHQ-9 Total Score 5 5 -- 14 --        Past Psychiatric Medication  Trials: Trintellix - Initially effective and then was not effective when re-started Paxil- Ineffective. Helped initially. Prozac-Insomnia Lexapro- Effective and then no longer as effective Zoloft- Initially effective and then minimal response Viibryd Cymbalta- Ineffective. Helped initially. Effexor XR 300- Effective, well tolerated. Pristiq- Increased anxiety at 150 mg daily Wellbutrin XL-Ineffective.May have increased anxiety. Amitriptyline Nortriptyline-Caused irritability, constipation Auvelity- ineffective Selegilne brief 20 mg daily , more irritable  Abilify- Helpful but caused severe insomnia. Rexulti-Helpful but caused insomnia. Vraylar Seroquel - Ineffective Risperdal Olanzapine- Ineffective Latuda- Ineffective Caplyta  Klonopin- Effective Temazepam- Took during menopause Lunesta- Ineffective Ambien-Ineffective Sonata- Ineffective Dayvigo- partially effective Failed resp Belsomra 15 and seroquel 25 Trazodone-  somewhat effective Remeron-Ineffective. Does not recall wt gain.  Quetiapine 25 NR  Adderall- Took once and had adverse effect. Concerta- Effective but only 4-6 hours Jornay 80 NR Metadate-Not as effective as Concerta Dexmethylphenidate- Not as effective as Concerta Azstarys- some side effects. Not as effective.  Ritalin short acting and SE anxiety & HTN @20  TID Cotemplay 17.3  Daytrana  Lamictal- May have had an adverse effects. "Felt weird." Reports worsening s/s.  Lithium ? effect Gabapentin Pramipexole- 1 mg QID NR  ECT #20 with minimal response. H says it was partly helpful.  AIMS    Flowsheet Row Video Visit from 01/03/2022 in [facility]  AIMS Total Score 0      ECT-MADRS    Flowsheet Row Clinical Support from 08/13/2022 in [facility] Video Visit from 04/29/2022 in [facility] Video Visit from 02/06/2022 in [facility]   MADRS Total Score 27 44 23      GAD-7    Flowsheet Row Counselor from 12/19/2022 in [facility] Office Visit from 08/22/2022 in [facility] [place]  Total GAD-7 Score 4 7      PHQ2-9    Flowsheet Row Counselor from 12/19/2022 in [facility] Office Visit from 08/22/2022 in [facility] [place] Office Visit from 08/19/2022 in [facility] Healthcare at [facility] Video Visit from 02/06/2022 in [facility] Office Visit from 11/16/2020 in [facility] Healthcare at [facility] Total Score 2 3 0 6 0  PHQ-9 Total Score 5 5 -- 14 --  Review of Systems:  Review of Systems  Cardiovascular:  Negative for palpitations.  Neurological:  Negative for dizziness and tremors.  Psychiatric/Behavioral:  Positive for dysphoric mood and sleep disturbance. Negative for decreased concentration. The patient is nervous/anxious.     Medications: I have reviewed the patient's current medications.  Current Outpatient Medications  Medication Sig Dispense Refill   clonazePAM (KLONOPIN) 0.5 MG tablet 2 tablets at night and 1 tablet as needed daily for panic anxiety (Patient taking differently: Take 1 mg by mouth at bedtime.) 75 tablet 1   Estradiol (VAGIFEM) 10 MCG TABS vaginal tablet Place 1 tablet (10 mcg total) vaginally 2 (two) times a week. 24 tablet 3   selegiline (ELDEPRYL) 5 MG capsule TAKE 3 CAPSULES BY MOUTH DAILY IN THE MORNING AND 1 CAPSULE DAILY AT NOON 120 capsule 0   Suvorexant (BELSOMRA) 20 MG TABS TAKE 1 TABLET BY MOUTH AT BEDTIME 30 tablet 2   valACYclovir (VALTREX) 500 MG tablet Take one twice daily for 3 days with any symptoms 30 tablet 3   valsartan (DIOVAN) 80 MG tablet Take 1 tablet (80 mg total) by mouth daily. 90 tablet 3   No current facility-administered medications for this visit.     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   TRANSAMINASES, SERUM, ELEVATED 08/14/2009    Past Medical History, Surgical history, Social history, and Family history were reviewed and updated as appropriate.   Please see review of systems for further details on the patient's review from today.   Objective:   Physical Exam:  LMP 12/27/2007 (Exact Date)   Physical Exam Constitutional:      General: She is not in acute distress. Musculoskeletal:        General: No deformity.  Neurological:     Mental Status: She is alert and oriented to person, place, and time.     Coordination: Coordination normal.  Psychiatric:        Attention and Perception: Attention and perception normal. She does not perceive auditory hallucinations.        Mood and Affect: Mood is anxious and depressed. Affect is not labile or blunt.        Speech: Speech normal. Speech is not slurred.        Behavior: Behavior normal.        Thought Content: Thought content normal. Thought content is not delusional. Thought content does not include homicidal or suicidal ideation. Thought content does not include suicidal plan.        Cognition and Memory: Cognition and memory normal.        Judgment: Judgment normal.     Comments: Insight intact Mood still mod dep lately Affect blunted mildly     Lab Review:     Component Value Date/Time   NA 132 (L) 10/27/2022 1107   K 4.4 10/27/2022 1107   CL 95 (L) 10/27/2022 1107   CO2 30 10/27/2022 1107   GLUCOSE 89 10/27/2022 1107   BUN 14 10/27/2022 1107   CREATININE 0.67 10/27/2022 1107   CREATININE 0.68 01/26/2020  0920  CALCIUM 10.1 10/27/2022 1107   PROT 8.3 04/22/2022 0932   ALBUMIN 5.3 (H) 04/22/2022 0932   AST 82 (H) 04/22/2022 0932   ALT 118 (H) 04/22/2022 0932   ALKPHOS 74 04/22/2022 0932   BILITOT 0.3 04/22/2022 0932   GFRNONAA >90 05/01/2014 1645   GFRAA >90 05/01/2014 1645       Component Value Date/Time   WBC 3.3 (L) 04/22/2022 0932   RBC 4.25 04/22/2022 0932   HGB 14.3 04/22/2022 0932   HGB 14.1 10/26/2014 1034   HCT 40.7 04/22/2022 0932   PLT 245.0 04/22/2022 0932   MCV 95.7 04/22/2022 0932   MCH 34.4 (H) 01/26/2020 0920   MCHC 35.1 04/22/2022 0932   RDW 13.2 04/22/2022 0932   LYMPHSABS 0.9 04/22/2022 0932   MONOABS 0.2 04/22/2022 0932   EOSABS 0.1 04/22/2022 0932   BASOSABS 0.0 04/22/2022 0932    No results found for: "POCLITH", "LITHIUM"   No results found for: "PHENYTOIN", "PHENOBARB", "VALPROATE", "CBMZ"   .res Assessment: Plan:    There are no diagnoses linked to this encounter.  Pt seen for TRD  Doesn't feels she can tolerate much selegiline DT irritability.  Disc importance of no med changes between appts unless emergency.  BC TRD need 6 week trial of AD at full dose at least.    Consider switch Spravato to MAOI, Parnate  We discussed the short-term risks associated with benzodiazepines including sedation and increased fall risk among others.  Discussed long-term side effect risk including dependence, potential withdrawal symptoms, and the potential eventual dose-related risk of dementia.  But recent studies from 2020 dispute this association between benzodiazepines and dementia risk. Newer studies in 2020 do not support an association with dementia.  Don't change sertraline  and continue 150 mg daily.  It has helped anxiety but not depression.  Disc importance of consistency and don't change meds on your own.  It can be confusing to evaluate and can sabotage tx.  for insomnia, clonazepam 1 mg HS.  trazodone doesn't work well but helps some Some better so  far with Belsomra 20 mg HS  She wants to continue Cotempla or Concerta bc helps mood early in day and focus  benefit lasts longer.  Have not been able to get stimulant to be consitently effective throughout the day.  DC selegiline.  Half life 10 hours so can start Venlavaxine on Friday.  Do not take earlier DT risk SS  Ok to resume Spravato next week.  FU weekly Spravato .  Meredith Staggers, MD, DFAPA    Please see After Visit Summary for patient specific instructions.  Future Appointments  Date Time Provider Department Center  12/22/2022 10:00 AM Cottle, Steva Ready., MD [facility] None  12/26/2022  2:00 PM Gaspar Bidding, [facility] None  12/31/2022 11:30 AM Cottle, Steva Ready., MD [facility] None  01/05/2023  2:00 PM Gaspar Bidding, [facility] None  02/02/2023  1:00 PM Gaspar Bidding, [facility] None  02/09/2023  1:00 PM Gaspar Bidding, [facility] None  02/16/2023  1:00 PM Gaspar Bidding, [facility] None  02/23/2023  1:00 PM Gaspar Bidding, [facility] None  09/18/2023  8:15 AM Jerene Bears, MD [facility]    No orders of the defined types were placed in this encounter.   ------------------------------- [id number]

## 2022-12-22 NOTE — Telephone Encounter (Signed)
Pt will be restarting Spravato treatments per Dr. Jennelle Human effective 12/22/2022, twice a week treatments for 4 weeks then reassess.   Pt previously approved for Spravato 84 mg with [employer] until 05/29/2023. PA# [id number]

## 2022-12-23 ENCOUNTER — Ambulatory Visit: Payer: 59

## 2022-12-23 ENCOUNTER — Ambulatory Visit (INDEPENDENT_AMBULATORY_CARE_PROVIDER_SITE_OTHER): Payer: 59 | Admitting: Psychiatry

## 2022-12-23 VITALS — BP 83/64 | HR 72

## 2022-12-23 DIAGNOSIS — F411 Generalized anxiety disorder: Secondary | ICD-10-CM | POA: Diagnosis not present

## 2022-12-23 DIAGNOSIS — F900 Attention-deficit hyperactivity disorder, predominantly inattentive type: Secondary | ICD-10-CM

## 2022-12-23 DIAGNOSIS — F339 Major depressive disorder, recurrent, unspecified: Secondary | ICD-10-CM

## 2022-12-23 DIAGNOSIS — G4700 Insomnia, unspecified: Secondary | ICD-10-CM | POA: Diagnosis not present

## 2022-12-23 NOTE — Progress Notes (Signed)
NURSES NOTE:   Pt met with Dr. Jennelle Human yesterday and it was decided pt would restart her Spravato Treatments. Her last treatment was on 11/10/22.      Patient arrived for her #44 Spravato treatment, pt had stopped and her last one was 11/10/22. Pt will begin twice a week treatments for at least 4 weeks then reassess on her depression and symptoms at that time. Pt is being treated for Treatment Resistant Depression, pt will be receiving 84 mg (3 of the 28 mg) nasal sprays, this is her maintenance dose and we will start twice a week treatments. Patient taken to treatment room, I explained and discussed her treatment and how the schedule should go, along with any side effects that may occur. Answered any questions and concerns the patient had. Pt's Spravato is delivered through [place] and she is now billing with her insurance. Spravato medication is stored at doctors office per REMS/FDA guidelines. The medication is required to be locked behind two doors per FDA/REMS Protocol. Medication is also disposed of properly per regulations. All treatments and her vital signs are documented in Spravato REMS per protocol of treatment center regulations. [facility] was notified pt is restarting, and will need her Spravato delivered again on a regular basis. No new PA is needed at this time.      Vital Signs assessed at 8:30 AM 102/66, pulse 72, pulse ox 92%. Instructed pt to blow her nose and recline back slightly to prevent any of medication dripping out of her nose. Informed pt since its been over a month since her last treatment she may feel the medication a lot stronger.  Pt. Given 1st dose (28 mg inhaler), then 5 minutes between 2nd dose and 3rd dose for total of 84 mg. No complaints of nausea/vomiting reported. Pt did listen to music today, she just reclined back and closed her eyes. After 40 minutes I went to assess her vital signs, no side effects or symptoms reported, B/P 97/66, pulse 61. Dr. Jennelle Human talked to  pt today about her treatment. Nurse was with pt a total of 60 minutes but pt observed for 120 minutes per protocol. Discharge vital signs were at 10:30 AM, B/P 83/64, pulse 72. Informed pt to drink fluids and to be careful getting up and down due to low B/P. She verbalized understanding. She reports dissociation but she was clear upon her discharge. She is scheduled on Thursday, August 29th.  She is instructed to contact office if needing anything prior to her next treatment.     LOT # [id number] [id number]

## 2022-12-24 ENCOUNTER — Encounter: Payer: Self-pay | Admitting: Psychiatry

## 2022-12-24 NOTE — Progress Notes (Signed)
[name] [id number] 08-24-1960 62 y.o.  Subjective:   Patient ID:  Heather Davila is a 62 y.o. (DOB 1960-05-23) female.  Chief Complaint:  Chief Complaint  Patient presents with   Follow-up   Depression   ADD   Sleeping Problem   Fatigue    HPI Heather Davila presents to the office today for follow-up of treatment resistant recurrent depression, generalized anxiety disorder and insomnia.  Almost too numerous to count med failures. She is here for Spravato administration for her treatment resistant depression.  05/21/22 appt noted: Patient was administered first dose Spravato 56 mg intranasally today.  The patient experienced the typical dissociation which gradually resolved over the 2-hour period of observation.  There were no complications.  Specifically the patient did not have nausea or vomiting or headache.   Dissociation was mild. Did not experience any emotional relief from disabling depression.wants to increase Spravato to the usual dose.  She is aware insurance is not covering the costs at this time.  No SI acutely. Tolerating meds.    05/23/22 appt noted: Patient was administered Spravato 84 mg intranasally today.  The patient experienced the typical dissociation which gradually resolved over the 2-hour period of observation.  There were no complications.  Specifically the patient did not have nausea or vomiting.   Dissociation was greater than last dose and a little bothersome DT some HA. Tolerating meds.   Mood has not changed significantly thus far with Spravato.  05/27/22 appt noted: Patient was administered Spravato 84 mg intranasally today.  The patient experienced the typical dissociation which gradually resolved over the 2-hour period of observation.  There were no complications.  Specifically the patient did not have nausea or vomiting or headache. Today she has felt a little relief from the pressing heaviness of depression but a little more anxiety.  During  administration of Spravato she listens to [place] music and was able to relax a little more with the feeling of dissociation that was mildly bothersome. Husband was also in on the session today and asked some questions about IV ketamine versus nasal spray ketamine.  05/29/22 appt noted: Cancelled today DT hypertension  05/30/22 appt noted: Patient was administered Spravato 84 mg intranasally today.  The patient experienced the typical dissociation which gradually resolved over the 2-hour period of observation.  There were no complications.  Specifically the patient did not have nausea or vomiting or headache. She is seeing some improvement in mood with less continuous depression and less intensity.  Hopefulness is better.   No med complaints or SE  06/05/22 appt noted: Patient was administered Spravato 84 mg intranasally today.  The patient experienced the typical dissociation which gradually resolved over the 2-hour period of observation.  There were no complications.  Specifically the patient did not have nausea or vomiting or headache.  Specifically HA is less of a problem with Spravato. No SE with meds. Depression is improving with less intensity.  Intermittent anxiety easily.  More able to enjoy things.  No new concerns.  06/17/22 appt noted: Patient was administered Spravato 84 mg intranasally today.  The patient experienced the typical dissociation which gradually resolved over the 2-hour period of observation.  There were no complications.  Specifically the patient did not have nausea or vomiting or headache.  Specifically HA is less of a problem with Spravato. No SE with meds. Depression is improved 45% with less intensity.  Intermittent anxiety less easily.  More able to enjoy things.  No new concerns.  More  active. Current meds: increased clonazepam to about 1.5 mg HS DT recent insomnia, lexapro 20, Dayvigo 10 mg HS, concerta 36 mg AM, trazodone 100 mg HS.  06/20/22 appt noted: Current  meds: increased clonazepam to about 1.5 mg HS DT recent insomnia, lexapro 20, Dayvigo 10 mg HS, concerta 36 mg AM, trazodone 100 mg HS. Patient was administered Spravato 84 mg intranasally today.  The patient experienced the typical dissociation which gradually resolved over the 2-hour period of observation.  There were no complications.  Specifically the patient did not have nausea or vomiting or headache.  Specifically HA is less of a problem with Spravato. No SE with meds. Depression is improved 45% with less intensity.  Intermittent anxiety less easily.  More able to enjoy things.  No new concerns.  More active.  06/23/22 appt noted:  Current meds: increased clonazepam to about 1.5 mg HS DT recent insomnia, lexapro 20, Dayvigo 10 mg HS, concerta 36 mg AM, trazodone 100 mg HS. Patient was administered Spravato 84 mg intranasally today.  The patient experienced the typical dissociation which gradually resolved over the 2-hour period of observation.  There were no complications.  Specifically the patient did not have nausea or vomiting or headache.  Specifically HA is less of a problem with Spravato. No SE with meds. Intensity of Spravato dissociation varies.  Last week very intesne and this week more typical.  Depression is continuing to improve with better interest and activity and motivation.  Gradual improvement.  Wants to continue.  06/25/22 appt noted: Current meds: increased clonazepam to about 1.5 mg HS DT recent insomnia, lexapro 20, Dayvigo 10 mg HS, concerta 36 mg AM, trazodone 100 mg HS. Patient was administered Spravato 84 mg intranasally today.  The patient experienced the typical dissociation which gradually resolved over the 2-hour period of observation.  There were no complications.  Specifically the patient did not have nausea or vomiting or headache.  Specifically HA is less of a problem with Spravato. No SE with meds. Intensity of Spravato dissociation varies.  No scary and well  tolerated. Continues to gradually improve with Spravato.  She is more motivated and enjoying things more.  H sees progress.  Wants to continue twice weekly until gets maximum response.  Dep is not gone yet.  06/30/22 appt noted:  seen with H Current meds: increased clonazepam to about 1.5 mg HS DT recent insomnia, lexapro 20, Dayvigo 10 mg HS, concerta 36 mg AM, trazodone 100 mg HS. Patient was administered Spravato 84 mg intranasally today.  The patient experienced the typical dissociation which gradually resolved over the 2-hour period of observation.  There were no complications.  Specifically the patient did not have nausea or vomiting or headache.  Specifically HA is less of a problem with Spravato. No SE with meds. Intensity of Spravato dissociation varies.  No scary and well tolerated. Continues to gradually improve with Spravato.  She is more motivated and enjoying things more.  H sees even more progress than she does; more active and smiling.  Wants to continue twice weekly until gets maximum response.  Dep is 80% better.  07/07/22 appt noted: Current meds: increased clonazepam to about 1.5 mg HS DT recent insomnia, lexapro 20, Dayvigo 10 mg HS, concerta 36 mg AM, trazodone 100 mg HS. Patient was administered Spravato 84 mg intranasally today.  The patient experienced the typical dissociation which gradually resolved over the 2-hour period of observation.  There were no complications.  Specifically the patient did not have  nausea or vomiting or headache.  Specifically HA is less of a problem with Spravato. No SE with meds. Intensity of Spravato dissociation varies.  No scary and well tolerated.  Was more intese today. Overall mood is markedly better and please with meds. No changes desired.  07/09/22 appt noted: In transition from Lexapro to sertraline and missing Concerta bc CO crash from it after about 4 hours.  Disc these concerns.  She'll discuss also with Corie Chiquito, NP More dep the  last couple of days and concerned about reducing Spravato frequency while transition from one antidep to another.  Affect less positive. Patient was administered Spravato 84 mg intranasally today.  The patient experienced the typical dissociation which gradually resolved over the 2-hour period of observation.  There were no complications.  Specifically the patient did not have nausea or vomiting or headache.  Specifically HA is less of a problem with Spravato. No SE with meds. Intensity of Spravato dissociation varies.  No scary and well tolerated.   07/14/22 appt noted: Has been more depressed this week.  More trouble with sleep.  Taking clonazepam 1-1.5  of the 0.5 mg tablets.   Trazodone not helping much. She switched back from 200 sertraline to Lexapro 20.  No SE More trouble with motivation.  Some stress with son with special needs. Misses the benevit of Concerta but it was too short acting and crashed in 4-6 hours. Patient was administered Spravato 84 mg intranasally today.  The patient experienced the typical dissociation which gradually resolved over the 2-hour period of observation.  There were no complications.  Specifically the patient did not have nausea or vomiting or headache.  Specifically HA is less of a problem with Spravato. No SE with meds. Intensity of Spravato dissociation varies.  No scary and well tolerated.   07/17/22 appt noted: Patient was administered Spravato 84 mg intranasally today.  The patient experienced the typical dissociation which gradually resolved over the 2-hour period of observation.  There were no complications.  Specifically the patient did not have nausea or vomiting or headache.  Specifically HA is less of a problem with Spravato. No SE with meds. Intensity of Spravato dissociation varies.  Not scary and well tolerated.  Just got Jornay last night but wasn't sure how to take it.  Wants to start and this was discussed.  She felt MPH helped mood and attn but  didn't last long enough and had crash after a few hours on Concerta.  Better for 2 hours after each dose Ritalin 20 mg with mood but then it wore off.  BP up after 3rd dose 160/90 and then took H's amlodipine 5 and BP became normal.  07/22/22 appt noted: Patient was administered Spravato 84 mg intranasally today.  The patient experienced the typical dissociation which gradually resolved over the 2-hour period of observation.  There were no complications.  Specifically the patient did not have nausea or vomiting or headache.  Specifically HA is less of a problem with Spravato. No SE with meds. Intensity of Spravato dissociation varies.  Not scary and well tolerated.  Start Jornay 20 mg over the weekend and did not notice any particular effect good or bad.  Increased to 40 mg. Other psych meds: Clonazepam 0.5 mg tablets 2 nightly, gabapentin dosage varies, Dayvigo 10 mg nightly, Lexapro 20 mg daily, to trazodone 100 mg nightly  07/24/22 appt noted: Patient was administered Spravato 84 mg intranasally today.  The patient experienced the typical dissociation which gradually resolved  over the 2-hour period of observation.  There were no complications.  Specifically the patient did not have nausea or vomiting or headache.  Specifically HA is less of a problem with Spravato. No SE with meds. Intensity of Spravato dissociation varies.  Not scary and well tolerated.  Other psych meds: Clonazepam 0.5 mg tablets 2 nightly, gabapentin dosage varies, Dayvigo 10 mg nightly, Lexapro 20 mg daily, to trazodone 100 mg nightly, Jornay 40 mg PM Doing well with Spravato.  Noticed a little effect from the Jornay 40 without SE but would like to increase the dose for ADD and mood.  Sleeping well.  No new concerns.  Still more depressed than she was with th initial benefit of Spravato.  07/30/22 note:  Patient was administered Spravato 84 mg intranasally today.  The patient experienced the typical dissociation which gradually  resolved over the 2-hour period of observation.  There were no complications.  Specifically the patient did not have nausea or vomiting or headache.  Specifically HA is less of a problem with Spravato. Other psych meds: Clonazepam 0.5 mg tablets 2 nightly, gabapentin dosage varies, Dayvigo 10 mg nightly, Lexapro 20 mg daily, to trazodone 100 mg nightly, Jornay 60 mg PM Wants to increase Jornay to 80 mg PM to increase benefit for ADD and depression. No SE She has experienced a major family crisis that threatens to change her daily life from now on.  It has not triggered suicidal thoughts at this time but she is very afraid.  No desire to change medications.  08/04/22 appt noted" Patient was administered Spravato 84 mg intranasally today.  The patient experienced the typical dissociation which gradually resolved over the 2-hour period of observation.  There were no complications.  Specifically the patient did not have nausea or vomiting or headache.  Specifically HA is less of a problem with Spravato. Other psych meds: Clonazepam 0.5 mg tablets 2 nightly, gabapentin dosage varies, Dayvigo 10 mg nightly, Lexapro 20 mg daily, trazodone 100 mg nightly, Jornay 80 mg PM No SE No benefit or SE with increase Jormay 80 mg.  Says Concerta helped ADD and depression but not Jornay.  However duration was insufficient.  Still depressed and wants change to another form of stimulant.  No concerns with other meds. Continues with strong family stressors creating uncertainty about her future but no quick resolution. No SI Trial Cotempla 17.3 and DC Ritalin & Jornay 80  08/11/2022 appointment noted: Patient was administered Spravato 84 mg intranasally today.  The patient experienced the typical dissociation which gradually resolved over the 2-hour period of observation.  There were no complications.  Specifically the patient did not have nausea or vomiting or headache.  Specifically HA is less of a problem with Spravato now  vs when started it.. Other psych meds: Clonazepam 0.5 mg tablets 2 nightly, gabapentin dosage varies, Dayvigo 10 mg nightly, trazodone 100 mg nightly, cotempla 17.3, switch from Lexapro 20 to sertaline 15o mg . No noticeable effects sertraline from for depression but has seen some antianxiety effects from the sertraline.  No side effects noted.  No side effects with the other medicines.  Still is only getting very brief benefit 3 to 4 hours from Cotempla for ADD and mood.  She wants to try the Daytrana patch as we have discussed before. No SE  08/13/22 appt: Patient was administered Spravato 84 mg intranasally today.  The patient experienced the typical dissociation which gradually resolved over the 2-hour period of observation.  There were no  complications.  Specifically the patient did not have nausea or vomiting or headache.  Specifically HA is less of a problem with Spravato now vs when started it.. Other psych meds: Clonazepam 0.5 mg tablets 2 nightly, gabapentin dosage varies, Dayvigo 10 mg nightly, trazodone 100 mg nightly, cotempla 17.3, switch from Lexapro 20 to sertaline 15o mg . No noticeable effects sertraline from for depression but has seen some antianxiety effects from the sertraline.  No side effects noted.  No side effects with the other medicines.  Still is only getting very brief benefit 3 to 4 hours from Cotempla for ADD and mood.  She wants to try the Daytrana patch as we have discussed before.  Hasn't been able to get it yet. No SE Plan.  Continue to try to get Daytrana 30 AM  08/18/22 appt noted: Patient was administered Spravato 84 mg intranasally today.  The patient experienced the typical dissociation which gradually resolved over the 2-hour period of observation.  There were no complications.  Specifically the patient did not have nausea or vomiting or headache.  Specifically HA is less of a problem with Spravato now vs when started it.. Other psych meds: Clonazepam 0.5 mg  tablets 2 nightly, gabapentin dosage varies, Dayvigo 10 mg nightly, trazodone 100 mg nightly, cotempla 17.3, switch from Lexapro 20 to sertaline 15o mg . No noticeable effects sertraline from for depression but has seen some antianxiety effects from the sertraline.  No side effects noted.  No side effects with the other medicines.  Still is only getting very brief benefit 3 to 4 hours from Cotempla for ADD and mood.  She wants to try the Daytrana patch as we have discussed before.  Hasn't been able to get it yet.  Overall depression is some better than it was.   No SE Plan.  Continue to try to get Daytrana 30 AM  08/25/22 appt : No benefit Daytrana.  No SE.  Wants further med changes and interested In retrying pramipexole.   Tolerating meds. Patient was administered Spravato 84 mg intranasally today.  The patient experienced the typical dissociation which gradually resolved over the 2-hour period of observation.  There were no complications.  Specifically the patient did not have nausea or vomiting or headache.  Specifically HA is less of a problem with Spravato now vs when started it.. Other psych meds: Clonazepam 0.5 mg tablets 2 nightly, gabapentin dosage varies, Dayvigo 10 mg nightly, trazodone 100 mg nightly, , switch from Lexapro 20 to sertaline 15o mg . Daytrana 30 AM for a few days. Struggles with depression and no SI.  Reduced interest and mood and motivation and enjoyment and socialization.  08/27/22 appt noted: Psych meds:  Clonazepam 0.5 mg tablets 2 nightly, gabapentin dosage varies, Dayvigo 10 mg nightly, trazodone 100 mg nightly,  sertaline 15o mg . Returned to [employer] AM, pramipexole  Tolerating meds. Patient was administered Spravato 84 mg intranasally today.  The patient experienced the typical dissociation which gradually resolved over the 2-hour period of observation.  There were no complications.  Specifically the patient did not have nausea or vomiting or headache.   Reports  taking cotempla bc brief mood benefit and sig focus and productivity benefit. Took 1 dose pramipexole and felt more dep.  09/01/22 appt noted: Psych meds:  Clonazepam 0.5 mg tablets 2 nightly, gabapentin dosage varies, Dayvigo 10 mg nightly, trazodone 100 mg nightly,  sertaline 15o mg . Returned to Cotempla AM, pramipexole  0.25 mg BID. Tolerating meds now. Patient  was administered Spravato 84 mg intranasally today.  The patient experienced the typical dissociation which gradually resolved over the 2-hour period of observation.  There were no complications.  Specifically the patient did not have nausea or vomiting or headache.   Willing to try the pramipexole and will continue 0.25 mg BID this week and then consider increase if tolerated.  Mood is a little better.  Still looking for further improvement.  09/03/22 appt noted: Psych meds:  Clonazepam 0.5 mg tablets 2 nightly, gabapentin dosage varies, Dayvigo 10 mg nightly, trazodone 100 mg nightly,  sertaline 15o mg . Returned to Cotempla AM, pramipexole  0.25 mg tablet 1 and 1/2 tablets twice daily BID. Tolerating meds now. Patient was administered Spravato 84 mg intranasally today.  The patient experienced the typical dissociation which gradually resolved over the 2-hour period of observation.  There were no complications.  Specifically the patient did not have nausea or vomiting or headache.   Depression may be a little better with the parmipexole and is tolerating it well so far.  Still struggling with focus and residual depression.  Tolerating meds without SE.  More hopeful and able to enjoy some things.  09/08/22 appt  Psych meds:  Clonazepam 0.5 mg tablets 2 nightly, gabapentin dosage varies, Dayvigo 10 mg nightly, trazodone 100 mg nightly,  sertaline 15o mg . Returned to Cotempla AM, pramipexole  0.5 mg BID. Tolerating meds now. Patient was administered Spravato 84 mg intranasally today.  The patient experienced the typical dissociation which  gradually resolved over the 2-hour period of observation.  There were no complications.  Specifically the patient did not have nausea or vomiting or headache.   She has seen her depression drop from a 10/10 prior to treatment with Spravato to now 4/10 With additional benefit noted when she increased the pramipexole to 0.5 mg twice daily.  She is not having side effects with this like she did in the past. Anxiety levels are also improved. She is sleeping okay with the current medications. Plan: continue pramipexole trial off-label and increase more slowly for TRD.  Increase  Gradually  to 0.75 mg BID  09/11/22 appt noted: Psych meds:  Clonazepam 0.5 mg tablets 2 nightly, gabapentin dosage varies, Dayvigo 10 mg nightly, trazodone 100 mg nightly,  sertaline 15o mg . Returned to Cotempla AM, pramipexole  0.75 mg BID. Tolerating meds now. Patient was administered Spravato 84 mg intranasally today.  The patient experienced the typical dissociation which gradually resolved over the 2-hour period of observation.  There were no complications.  Specifically the patient did not have nausea or vomiting or headache.   She has seen her depression drop from a 10/10 prior to treatment with Spravato to now 4/10 With additional benefit noted when she increased the pramipexole to 0.5 mg twice daily.  She is not having side effects with this like she did in the past. Clearly improving with the pramipexole now and tolerating it.  Satisfied with current meds but would consider increasing if it might be helpful.  09/15/22 appt noted: Psych meds:  Clonazepam 0.5 mg tablets 2 nightly, gabapentin dosage varies, Dayvigo 10 mg nightly, trazodone 100 mg nightly,  sertaline 15o mg . Returned to Cotempla AM, pramipexole  0.75 mg BID too anxious and reduced to 0.5 mg BID. Tolerating meds now. Patient was administered Spravato 84 mg intranasally today.  The patient experienced the typical dissociation which gradually resolved over  the 2-hour period of observation.  There were no complications.  Specifically  the patient did not have nausea or vomiting or headache.   Trouble staying asleep longer than 6 hours.  Doesn't feel she can tolerate pramipexole 0.75 mg BID DT anxiety not experienced at  0.5 mg BID.  Otherwise tolerating meds.  Mood is better with Spravato and pramipexole.   Not sleeping as long with meds.  EMA 6 hours.  Wonders about change Disc concerns about waiting for therapy. Feels ready to reduce Spravato to weekly.    09/25/22 appt noted: Psych meds:  Clonazepam 0.5 mg tablets 2 nightly, gabapentin dosage varies, Dayvigo 10 mg nightly, trazodone 100 mg nightly,  sertaline 15o mg . Returned to Cotempla AM, pramipexole  0.75 mg BID too anxious and reduced to 0.5 mg BID. Tolerating meds now. Patient was administered Spravato 84 mg intranasally today.  The patient experienced the typical dissociation which gradually resolved over the 2-hour period of observation.  There were no complications.  Specifically the patient did not have nausea or vomiting or headache.   Trouble staying asleep longer than 6 hours.  Failed to respond to Seroquel 25 mg HS.  Gone back to [facility].  Belsomra NR. Mood ok until the last few days more dep bc it's been 10 days since last admin.  Wants to continue weekly.   No SE.  No med changes desired.  09/29/22 appt noted: Psych meds:  Clonazepam 0.5 mg tablets 2 nightly, gabapentin dosage varies, Dayvigo 10 mg nightly, trazodone 100 mg nightly,  sertaline 15o mg . Returned to Cotempla AM, pramipexole  0.75 mg BID too anxious and reduced to 0.5 mg BID. Tolerating meds now. Patient was administered Spravato 84 mg intranasally today.  The patient experienced the typical dissociation which gradually resolved over the 2-hour period of observation.  There were no complications.  Specifically the patient did not have nausea or vomiting or headache.   Trouble staying asleep longer than 6 hours.  Plan:  Continue sertaline to 200 mg daily.  It has helped anxiety but not depression.   for insomnia, clonazepam 1 mg HS. trazodone doesn't work well but helps some Continue Dayvigo 10 HS.  Failed resp Belsomra 15 and seroquel 25 She wants to continue Cotempla or Concerta bc helps mood early in day and focus  benefit lasts longer.  Have not been able to get stimulant to be consitently effective throughout the day. continue pramipexole trial off-label and reduce back to  TRD 0.5 mg BID Spravato weekly.  10/13/22 appt noted: Not sleeping as well.  Wants to increase Spravato to twice weekly bc feeling more dep close to the next sched date.  Just the day or 2 before.  .  Plan: Trial for off label TRD increase pramipexole 1 mg TID  10/20/22 appt noted: Psych meds:  Clonazepam 0.5 mg tablets 2 nightly, gabapentin dosage varies, Dayvigo 10 mg nightly, trazodone 100 mg nightly,  sertaline 15o mg . Returned to Cotempla AM, pramipexole  1 mg TID. Tolerating meds now. Patient was administered Spravato 84 mg intranasally today.  The patient experienced the typical dissociation which gradually resolved over the 2-hour period of observation.  There were no complications.  Specifically the patient did not have nausea or vomiting or headache.   Trouble staying asleep longer than 6 hours.  More dep this week and asked to get Spravato twice this week. Plan: Trial for off label TRD increase pramipexole 1 mg QID  10/22/22 appt noted:  Psych meds:  Clonazepam 0.5 mg tablets 2 nightly and another one with  EMA, gabapentin dosage varies, Belsomra 20 mg daily,  trazodone 100 mg nightly,  sertaline 15o mg . Returned to Cotempla AM, pramipexole  1 mg QID for 2nd day Tolerating meds now.  Except a little N and dizzy briefly after takes it. Patient was administered Spravato 84 mg intranasally today.  The patient experienced the typical dissociation which gradually resolved over the 2-hour period of observation.  There were no  complications.  Specifically the patient did not have nausea or vomiting or headache. No change in mood yet.   Sleep better with Belsomra 20 as long as takes with other meds.  If insomnia mood is worse. Ongoing some dep but gets partial relief with Spravato but would like better relief .  10/27/22 received Spravato 84  11/03/22 appt noted:  Meds as above except sertraline she cut to 100 mg instead of 200 mg daily She doesn't think sertraline helping and worries over poss SE Off and on Cotempla .  Not affecting BP. Doesn't notice benefit or SE with pramipexole 1 mg QID.  No SE Sleep is OK.  Some awakening aftter 6 hours but not enough so takes another clonazepam.   Patient was administered Spravato 84 mg intranasally today.  The patient experienced the typical dissociation which gradually resolved over the 2-hour period of observation.  There were no complications.  Specifically the patient did not have nausea or vomiting or headache. No change in mood yet.   Sleep better with Belsomra 20 as long as takes with other meds.  If insomnia mood is worse. Ongoing some dep but gets partial relief with Spravato but would like better relief .  No sig benefit with pramipexole 1 mg QID.  11/21/22 TC:  To ER with dep 11/21/22. No SE with selegiline but feels more dep.  Wants to stop it. Today is only day 2 of 1 tablet of selegiline 5 mg AM.  Reviewed with her that she had not been satisfied with the response of Spravato and Zoloft and that she is likely feeling worse because the Zoloft is wearing off and in particular because she abruptly stopped 150 mg daily.  She needs to give the selegiline time to work.  She agrees to do so.  She admitted to some passive suicidal thoughts but no active suicidal thoughts and no desire to die.  She agrees to the plan to increase selegiline to 10 mg every morning this weekend and to continue the selegiline trial.  She cannot stop this and immediately start another AD due to risk  DDI with serotonin syndrome.  She is aware.  Meredith Staggers, MD, DFAPA  12/19/22 TC: Pt called at 1:42p.  She said she wanted to know if she could start weaning off the Selegiline.  She thinks increasing the dosage is making things worse.  She said she wants to go back to [employer].  She acknowleged she has an appt on Monday. I advised her that I wasn't sure Dr Jennelle Human will see her message before her appt, but she still ask me to send the message.  Next appt 8/26 MD resp.  No change until Monday appt.  12/22/22 appt noted: Meds: selegiline up to 5 mg 4 daily for a week but felt it incr irritability and went back to 3 mg daily. She wants to go back to Spravato.  Not near as dep as now.  Selegiline didn't help energy. Still on Belsomra Thinks maybe sertraline helped mood more than she thought. More dep than anxiety.  Plan: DC selegiline and will retry Effexor which helped in the past at 300 mg daily.  Start this Friday after over 10 half lives of selegiline.    12/24/22 appt noted: Stopped selegiline  Psych med: belsomra 20, clonazepam 1 mg HS.   She has felt more dep off the Spravato though at the time didn't think it was helping.  Has felt more down and withdrawn.  Low energy, motivation, not smiling.  No SI.  Sleep ok.   Patient was administered Spravato 84 mg intranasally today.  The patient experienced the typical dissociation which gradually resolved over the 2-hour period of observation.  There were no complications.  Specifically the patient did not have nausea or vomiting or headache. No problems with BP or other unusual SE Mood is a little better after this administration of Spravato.  Very motivated to continue it.  No immediate med concerns. No SI .  Ongoing anhedonia.  AIMS    Flowsheet Row Video Visit from 01/03/2022 in [facility]  AIMS Total Score 0      ECT-MADRS    Flowsheet Row Clinical Support from 08/13/2022 in [facility] Video Visit from 04/29/2022 in [facility] Video Visit from 02/06/2022 in [facility]  MADRS Total Score 27 44 23      GAD-7    Flowsheet Row Counselor from 12/19/2022 in [facility] Office Visit from 08/22/2022 in [facility] [place]  Total GAD-7 Score 4 7      PHQ2-9    Flowsheet Row Counselor from 12/19/2022 in [facility] Office Visit from 08/22/2022 in [facility] [place] Office Visit from 08/19/2022 in [facility] at [facility] Video Visit from 02/06/2022 in [facility] Office Visit from 11/16/2020 in [facility] at [facility] Total Score 2 3 0 6 0  PHQ-9 Total Score 5 5 -- 14 --        Past Psychiatric Medication Trials: Trintellix - Initially effective and then was not effective when re-started Paxil- Ineffective. Helped initially. Prozac-Insomnia Lexapro- Effective and then no longer as effective Zoloft- Initially effective and then minimal response Viibryd Cymbalta- Ineffective. Helped initially. Effexor XR 300- Effective, well tolerated. Pristiq- Increased anxiety at 150 mg daily Wellbutrin XL-Ineffective.May have increased anxiety. Amitriptyline Nortriptyline-Caused irritability, constipation Auvelity- ineffective Selegilne brief 20 mg daily , more irritable  Abilify- Helpful but caused severe insomnia. Rexulti-Helpful but caused insomnia. Vraylar Seroquel - Ineffective Risperdal Olanzapine- Ineffective Latuda- Ineffective Caplyta  Klonopin- Effective Temazepam- Took during menopause Lunesta- Ineffective Ambien-Ineffective Sonata- Ineffective Dayvigo- partially effective Failed resp Belsomra 15 and seroquel 25 Trazodone-  somewhat effective Remeron-Ineffective. Does not recall wt  gain.  Quetiapine 25 NR  Adderall- Took once and had adverse effect. Concerta- Effective but only 4-6 hours Jornay 80 NR Metadate-Not as effective as Concerta Dexmethylphenidate- Not as effective as Concerta Azstarys- some side effects. Not as effective.  Ritalin short acting and SE anxiety & HTN @20  TID Cotemplay 17.3  Daytrana  Lamictal- May have had an adverse effects. "Felt weird." Reports worsening s/s.  Lithium ? effect Gabapentin Pramipexole- 1 mg QID NR  ECT #20 with minimal response. H says it was partly helpful.  AIMS    Flowsheet Row Video Visit from 01/03/2022 in [facility]  AIMS Total Score 0  ECT-MADRS    Flowsheet Row Clinical Support from 08/13/2022 in [facility] Video Visit from 04/29/2022 in [facility] Video Visit from 02/06/2022 in [facility]  MADRS Total Score 27 44 23      GAD-7    Flowsheet Row Counselor from 12/19/2022 in [facility] Office Visit from 08/22/2022 in [facility] [place]  Total GAD-7 Score 4 7      PHQ2-9    Flowsheet Row Counselor from 12/19/2022 in [facility] Office Visit from 08/22/2022 in [facility] [place] Office Visit from 08/19/2022 in [facility] at [facility] Video Visit from 02/06/2022 in [facility] Office Visit from 11/16/2020 in [facility] at [facility]  PHQ-2 Total Score 2 3 0 6 0  PHQ-9 Total Score 5 5 -- 14 --        Review of Systems:  Review of Systems  Cardiovascular:  Negative for palpitations.  Neurological:  Negative for dizziness, tremors and weakness.  Psychiatric/Behavioral:  Positive for dysphoric mood and sleep disturbance. Negative for agitation and decreased concentration. The  patient is nervous/anxious.     Medications: I have reviewed the patient's current medications.  Current Outpatient Medications  Medication Sig Dispense Refill   clonazePAM (KLONOPIN) 0.5 MG tablet 2 tablets at night and 1 tablet as needed daily for panic anxiety (Patient taking differently: Take 1 mg by mouth at bedtime.) 75 tablet 1   Esketamine HCl, 84 MG Dose, (SPRAVATO, 84 MG DOSE,) 28 MG/DEVICE SOPK Place 84 mg into the nose every 3 (three) days. 3 each 1   Estradiol (VAGIFEM) 10 MCG TABS vaginal tablet Place 1 tablet (10 mcg total) vaginally 2 (two) times a week. 24 tablet 3   Suvorexant (BELSOMRA) 20 MG TABS TAKE 1 TABLET BY MOUTH AT BEDTIME 30 tablet 2   valACYclovir (VALTREX) 500 MG tablet Take one twice daily for 3 days with any symptoms 30 tablet 3   valsartan (DIOVAN) 80 MG tablet Take 1 tablet (80 mg total) by mouth daily. 90 tablet 3   venlafaxine XR (EFFEXOR XR) 37.5 MG 24 hr capsule 1 capsule daily for 4 days, then 2 capsules daily 30 capsule 0   No current facility-administered medications for this visit.    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   TRANSAMINASES, SERUM, ELEVATED 08/14/2009    Past Medical History, Surgical history, Social history, and Family history were reviewed and updated as appropriate.   Please see review of systems for further details on the patient's review from today.   Objective:   Physical Exam:  LMP 12/27/2007 (Exact Date)   Physical Exam Constitutional:      General: She is not in acute distress. Musculoskeletal:        General: No deformity.  Neurological:     Mental Status:  She is alert and oriented to person, place, and time.     Coordination: Coordination normal.  Psychiatric:        Attention and Perception: Attention and perception normal. She does not perceive auditory hallucinations.        Mood and Affect: Mood is anxious and depressed. Affect is blunt.        Speech: Speech normal. Speech is not slurred.        Behavior: Behavior normal.        Thought Content: Thought content normal. Thought content is not delusional. Thought content does not include homicidal or suicidal ideation. Thought content does not include suicidal plan.        Cognition and Memory: Cognition and memory normal.        Judgment: Judgment normal.     Comments: Insight intact Mood still mod dep lately Affect blunted     Lab Review:     Component Value Date/Time   NA 132 (L) 10/27/2022 1107   K 4.4 10/27/2022 1107   CL 95 (L) 10/27/2022 1107   CO2 30 10/27/2022 1107   GLUCOSE 89 10/27/2022 1107   BUN 14 10/27/2022 1107   CREATININE 0.67 10/27/2022 1107   CREATININE 0.68 01/26/2020 0920   CALCIUM 10.1 10/27/2022 1107   PROT 8.3 04/22/2022 0932   ALBUMIN 5.3 (H) 04/22/2022 0932   AST 82 (H) 04/22/2022 0932   ALT 118 (H) 04/22/2022 0932   ALKPHOS 74 04/22/2022 0932   BILITOT 0.3 04/22/2022 0932   GFRNONAA >90 05/01/2014 1645   GFRAA >90 05/01/2014 1645       Component Value Date/Time   WBC 3.3 (L) 04/22/2022 0932   RBC 4.25 04/22/2022 0932   HGB 14.3 04/22/2022 0932   HGB 14.1 10/26/2014 1034   HCT 40.7 04/22/2022 0932   PLT 245.0 04/22/2022 0932   MCV 95.7 04/22/2022 0932   MCH 34.4 (H) 01/26/2020 0920   MCHC 35.1 04/22/2022 0932   RDW 13.2 04/22/2022 0932   LYMPHSABS 0.9 04/22/2022 0932   MONOABS 0.2 04/22/2022 0932   EOSABS 0.1 04/22/2022 0932   BASOSABS 0.0 04/22/2022 0932    No results found for: "POCLITH", "LITHIUM"   No results found for: "PHENYTOIN", "PHENOBARB", "VALPROATE", "CBMZ"   .res Assessment: Plan:    Martell was seen today for  follow-up, depression, add, sleeping problem and fatigue.  Diagnoses and all orders for this visit:  Recurrent major depression resistant to treatment (HCC)  Generalized anxiety disorder  Attention deficit hyperactivity disorder (ADHD), predominantly inattentive type  Insomnia, unspecified type   Pt seen for TRD  Stopped  selegiline DT irritability.  Disc importance of no med changes between appts unless emergency.  BC TRD need 6 week trial of AD at full dose at least.    Consider switch Spravato to MAOI, Parnate  We discussed the short-term risks associated with benzodiazepines including sedation and increased fall risk among others.  Discussed long-term side effect risk including dependence, potential withdrawal symptoms, and the potential eventual dose-related risk of dementia.  But recent studies from 2020 dispute this association between benzodiazepines and dementia risk. Newer studies in 2020 do not support an association with dementia.  Disc compliance issues.  She sometimes has trouble staying on a med long enough to get benefit.  for insomnia, clonazepam 1 mg HS.  Some better so far with Belsomra 20 mg HS  She wants to continue Cotempla or Concerta bc helps mood early in day and focus  benefit lasts longer.  Have not been able to get stimulant to be consitently effective throughout the day.  DC selegiline.  Half life 10 hours so can start Venlavaxine on Friday.  Do not take earlier DT risk SS  Ok to resume Spravato  Patient was administered Spravato 84 mg intranasally today.  The patient experienced the typical dissociation which gradually resolved over the 2-hour period of observation.  There were no complications.  Specifically the patient did not have nausea or vomiting or headache.  Blood pressures remained within normal ranges at the 40-minute and 2-hour follow-up intervals.  By the time the 2-hour observation period was met the patient was alert and oriented and able to  exit without assistance.  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  See nursing note for further details.  FU weekly Spravato .  Meredith Staggers, MD, DFAPA    Please see After Visit Summary for patient specific instructions.  Future Appointments  Date Time Provider Department Center  12/25/2022  9:00 AM Cottle, Steva Ready., MD [facility] None  12/25/2022  9:00 AM CP-NURSE [facility] None  12/26/2022  2:00 PM Gaspar Bidding [facility] None  12/31/2022 11:30 AM Cottle, Steva Ready., MD CP[facility] None  01/05/2023  2:00 PM Gaspar Bidding, [facility] None  02/09/2023  1:00 PM Gaspar Bidding, [facility] CP-CP None  02/16/2023  1:00 PM Gaspar Bidding [facility] CP-[facility] None  02/23/2023  1:00 PM Gaspar Bidding, [facility] CP-CP None  09/18/2023  8:15 AM Jerene Bears, MD [facility]    No orders of the defined types were placed in this encounter.   ------------------------------- [id number]

## 2022-12-25 ENCOUNTER — Encounter: Payer: Self-pay | Admitting: Psychiatry

## 2022-12-25 ENCOUNTER — Ambulatory Visit (INDEPENDENT_AMBULATORY_CARE_PROVIDER_SITE_OTHER): Payer: 59 | Admitting: Psychiatry

## 2022-12-25 ENCOUNTER — Ambulatory Visit: Payer: 59

## 2022-12-25 VITALS — BP 102/63 | HR 66

## 2022-12-25 DIAGNOSIS — F411 Generalized anxiety disorder: Secondary | ICD-10-CM | POA: Diagnosis not present

## 2022-12-25 DIAGNOSIS — Z6379 Other stressful life events affecting family and household: Secondary | ICD-10-CM

## 2022-12-25 DIAGNOSIS — F900 Attention-deficit hyperactivity disorder, predominantly inattentive type: Secondary | ICD-10-CM

## 2022-12-25 DIAGNOSIS — F339 Major depressive disorder, recurrent, unspecified: Secondary | ICD-10-CM

## 2022-12-25 DIAGNOSIS — G4700 Insomnia, unspecified: Secondary | ICD-10-CM

## 2022-12-25 NOTE — Progress Notes (Signed)
NURSES NOTE:          Patient arrived for her #45 Spravato treatment. Pt will begin twice a week treatments for at least 4 weeks then reassess on her depression and symptoms at that time. Pt is being treated for Treatment Resistant Depression, pt will be receiving 84 mg (3 of the 28 mg) nasal sprays, this is her maintenance dose and we will start twice a week treatments. Patient taken to treatment room, I explained and discussed her treatment and how the schedule should go, along with any side effects that may occur. Answered any questions and concerns the patient had. Pt's Spravato is delivered through [place] and she is now billing with her insurance. Spravato medication is stored at doctors office per REMS/FDA guidelines. The medication is required to be locked behind two doors per FDA/REMS Protocol. Medication is also disposed of properly per regulations. All treatments and her vital signs are documented in Spravato REMS per protocol of treatment center regulations. [facility] was notified pt is restarting, and will need her Spravato delivered again on a regular basis. No new PA is needed at this time.      Vital Signs assessed at 8:05 AM 103/71, pulse 63, pulse ox 92%. Instructed pt to blow her nose and recline back slightly to prevent any of medication dripping out of her nose. Informed pt since its been over a month since her last treatment she may feel the medication a lot stronger.  Pt. Given 1st dose (28 mg inhaler), then 5 minutes between 2nd dose and 3rd dose for total of 84 mg. No complaints of nausea/vomiting reported. Pt did listen to music today, she just reclined back and closed her eyes. After 40 minutes I went to assess her vital signs, no side effects or symptoms reported, B/P 103/66, pulse 61. Dr. Jennelle Human talked to pt today about her treatment. Nurse was with pt a total of 60 minutes but pt observed for 120 minutes per protocol. Discharge vital signs were at 10:05 AM, B/P 102/63,  pulse 66. She verbalized understanding. She reports dissociation but she was clear upon her discharge. She is scheduled on Tuesday, September 3rd and 5th next week.  She is instructed to contact office if needing anything prior to her next treatment.     LOT # [id number] OCT 2026

## 2022-12-25 NOTE — Progress Notes (Signed)
Heather Davila [id number] 10/09/1960 62 y.o.  Subjective:   Patient ID:  Heather Davila is a 62 y.o. (DOB 03-13-1961) female.  Chief Complaint:  Chief Complaint  Patient presents with   Follow-up   Depression   Anxiety   Sleeping Problem   Fatigue    HPI Sheritta Fiest presents to the office today for follow-up of treatment resistant recurrent depression, generalized anxiety disorder and insomnia.  Almost too numerous to count med failures. She is here for Spravato administration for her treatment resistant depression.  05/21/22 appt noted: Patient was administered first dose Spravato 56 mg intranasally today.  The patient experienced the typical dissociation which gradually resolved over the 2-hour period of observation.  There were no complications.  Specifically the patient did not have nausea or vomiting or headache.   Dissociation was mild. Did not experience any emotional relief from disabling depression.wants to increase Spravato to the usual dose.  She is aware insurance is not covering the costs at this time.  No SI acutely. Tolerating meds.    05/23/22 appt noted: Patient was administered Spravato 84 mg intranasally today.  The patient experienced the typical dissociation which gradually resolved over the 2-hour period of observation.  There were no complications.  Specifically the patient did not have nausea or vomiting.   Dissociation was greater than last dose and a little bothersome DT some HA. Tolerating meds.   Mood has not changed significantly thus far with Spravato.  05/27/22 appt noted: Patient was administered Spravato 84 mg intranasally today.  The patient experienced the typical dissociation which gradually resolved over the 2-hour period of observation.  There were no complications.  Specifically the patient did not have nausea or vomiting or headache. Today she has felt a little relief from the pressing heaviness of depression but a little more anxiety.  During  administration of Spravato she listens to [place] music and was able to relax a little more with the feeling of dissociation that was mildly bothersome. Husband was also in on the session today and asked some questions about IV ketamine versus nasal spray ketamine.  05/29/22 appt noted: Cancelled today DT hypertension  05/30/22 appt noted: Patient was administered Spravato 84 mg intranasally today.  The patient experienced the typical dissociation which gradually resolved over the 2-hour period of observation.  There were no complications.  Specifically the patient did not have nausea or vomiting or headache. She is seeing some improvement in mood with less continuous depression and less intensity.  Hopefulness is better.   No med complaints or SE  06/05/22 appt noted: Patient was administered Spravato 84 mg intranasally today.  The patient experienced the typical dissociation which gradually resolved over the 2-hour period of observation.  There were no complications.  Specifically the patient did not have nausea or vomiting or headache.  Specifically HA is less of a problem with Spravato. No SE with meds. Depression is improving with less intensity.  Intermittent anxiety easily.  More able to enjoy things.  No new concerns.  06/17/22 appt noted: Patient was administered Spravato 84 mg intranasally today.  The patient experienced the typical dissociation which gradually resolved over the 2-hour period of observation.  There were no complications.  Specifically the patient did not have nausea or vomiting or headache.  Specifically HA is less of a problem with Spravato. No SE with meds. Depression is improved 45% with less intensity.  Intermittent anxiety less easily.  More able to enjoy things.  No new concerns.  More  active. Current meds: increased clonazepam to about 1.5 mg HS DT recent insomnia, lexapro 20, Dayvigo 10 mg HS, concerta 36 mg AM, trazodone 100 mg HS.  06/20/22 appt noted: Current  meds: increased clonazepam to about 1.5 mg HS DT recent insomnia, lexapro 20, Dayvigo 10 mg HS, concerta 36 mg AM, trazodone 100 mg HS. Patient was administered Spravato 84 mg intranasally today.  The patient experienced the typical dissociation which gradually resolved over the 2-hour period of observation.  There were no complications.  Specifically the patient did not have nausea or vomiting or headache.  Specifically HA is less of a problem with Spravato. No SE with meds. Depression is improved 45% with less intensity.  Intermittent anxiety less easily.  More able to enjoy things.  No new concerns.  More active.  06/23/22 appt noted:  Current meds: increased clonazepam to about 1.5 mg HS DT recent insomnia, lexapro 20, Dayvigo 10 mg HS, concerta 36 mg AM, trazodone 100 mg HS. Patient was administered Spravato 84 mg intranasally today.  The patient experienced the typical dissociation which gradually resolved over the 2-hour period of observation.  There were no complications.  Specifically the patient did not have nausea or vomiting or headache.  Specifically HA is less of a problem with Spravato. No SE with meds. Intensity of Spravato dissociation varies.  Last week very intesne and this week more typical.  Depression is continuing to improve with better interest and activity and motivation.  Gradual improvement.  Wants to continue.  06/25/22 appt noted: Current meds: increased clonazepam to about 1.5 mg HS DT recent insomnia, lexapro 20, Dayvigo 10 mg HS, concerta 36 mg AM, trazodone 100 mg HS. Patient was administered Spravato 84 mg intranasally today.  The patient experienced the typical dissociation which gradually resolved over the 2-hour period of observation.  There were no complications.  Specifically the patient did not have nausea or vomiting or headache.  Specifically HA is less of a problem with Spravato. No SE with meds. Intensity of Spravato dissociation varies.  No scary and well  tolerated. Continues to gradually improve with Spravato.  She is more motivated and enjoying things more.  H sees progress.  Wants to continue twice weekly until gets maximum response.  Dep is not gone yet.  06/30/22 appt noted:  seen with H Current meds: increased clonazepam to about 1.5 mg HS DT recent insomnia, lexapro 20, Dayvigo 10 mg HS, concerta 36 mg AM, trazodone 100 mg HS. Patient was administered Spravato 84 mg intranasally today.  The patient experienced the typical dissociation which gradually resolved over the 2-hour period of observation.  There were no complications.  Specifically the patient did not have nausea or vomiting or headache.  Specifically HA is less of a problem with Spravato. No SE with meds. Intensity of Spravato dissociation varies.  No scary and well tolerated. Continues to gradually improve with Spravato.  She is more motivated and enjoying things more.  H sees even more progress than she does; more active and smiling.  Wants to continue twice weekly until gets maximum response.  Dep is 80% better.  07/07/22 appt noted: Current meds: increased clonazepam to about 1.5 mg HS DT recent insomnia, lexapro 20, Dayvigo 10 mg HS, concerta 36 mg AM, trazodone 100 mg HS. Patient was administered Spravato 84 mg intranasally today.  The patient experienced the typical dissociation which gradually resolved over the 2-hour period of observation.  There were no complications.  Specifically the patient did not have  nausea or vomiting or headache.  Specifically HA is less of a problem with Spravato. No SE with meds. Intensity of Spravato dissociation varies.  No scary and well tolerated.  Was more intese today. Overall mood is markedly better and please with meds. No changes desired.  07/09/22 appt noted: In transition from Lexapro to sertraline and missing Concerta bc CO crash from it after about 4 hours.  Disc these concerns.  She'll discuss also with Corie Chiquito, NP More dep the  last couple of days and concerned about reducing Spravato frequency while transition from one antidep to another.  Affect less positive. Patient was administered Spravato 84 mg intranasally today.  The patient experienced the typical dissociation which gradually resolved over the 2-hour period of observation.  There were no complications.  Specifically the patient did not have nausea or vomiting or headache.  Specifically HA is less of a problem with Spravato. No SE with meds. Intensity of Spravato dissociation varies.  No scary and well tolerated.   07/14/22 appt noted: Has been more depressed this week.  More trouble with sleep.  Taking clonazepam 1-1.5  of the 0.5 mg tablets.   Trazodone not helping much. She switched back from 200 sertraline to Lexapro 20.  No SE More trouble with motivation.  Some stress with son with special needs. Misses the benevit of Concerta but it was too short acting and crashed in 4-6 hours. Patient was administered Spravato 84 mg intranasally today.  The patient experienced the typical dissociation which gradually resolved over the 2-hour period of observation.  There were no complications.  Specifically the patient did not have nausea or vomiting or headache.  Specifically HA is less of a problem with Spravato. No SE with meds. Intensity of Spravato dissociation varies.  No scary and well tolerated.   07/17/22 appt noted: Patient was administered Spravato 84 mg intranasally today.  The patient experienced the typical dissociation which gradually resolved over the 2-hour period of observation.  There were no complications.  Specifically the patient did not have nausea or vomiting or headache.  Specifically HA is less of a problem with Spravato. No SE with meds. Intensity of Spravato dissociation varies.  Not scary and well tolerated.  Just got Jornay last night but wasn't sure how to take it.  Wants to start and this was discussed.  She felt MPH helped mood and attn but  didn't last long enough and had crash after a few hours on Concerta.  Better for 2 hours after each dose Ritalin 20 mg with mood but then it wore off.  BP up after 3rd dose 160/90 and then took H's amlodipine 5 and BP became normal.  07/22/22 appt noted: Patient was administered Spravato 84 mg intranasally today.  The patient experienced the typical dissociation which gradually resolved over the 2-hour period of observation.  There were no complications.  Specifically the patient did not have nausea or vomiting or headache.  Specifically HA is less of a problem with Spravato. No SE with meds. Intensity of Spravato dissociation varies.  Not scary and well tolerated.  Start Jornay 20 mg over the weekend and did not notice any particular effect good or bad.  Increased to 40 mg. Other psych meds: Clonazepam 0.5 mg tablets 2 nightly, gabapentin dosage varies, Dayvigo 10 mg nightly, Lexapro 20 mg daily, to trazodone 100 mg nightly  07/24/22 appt noted: Patient was administered Spravato 84 mg intranasally today.  The patient experienced the typical dissociation which gradually resolved  over the 2-hour period of observation.  There were no complications.  Specifically the patient did not have nausea or vomiting or headache.  Specifically HA is less of a problem with Spravato. No SE with meds. Intensity of Spravato dissociation varies.  Not scary and well tolerated.  Other psych meds: Clonazepam 0.5 mg tablets 2 nightly, gabapentin dosage varies, Dayvigo 10 mg nightly, Lexapro 20 mg daily, to trazodone 100 mg nightly, Jornay 40 mg PM Doing well with Spravato.  Noticed a little effect from the Jornay 40 without SE but would like to increase the dose for ADD and mood.  Sleeping well.  No new concerns.  Still more depressed than she was with th initial benefit of Spravato.  07/30/22 note:  Patient was administered Spravato 84 mg intranasally today.  The patient experienced the typical dissociation which gradually  resolved over the 2-hour period of observation.  There were no complications.  Specifically the patient did not have nausea or vomiting or headache.  Specifically HA is less of a problem with Spravato. Other psych meds: Clonazepam 0.5 mg tablets 2 nightly, gabapentin dosage varies, Dayvigo 10 mg nightly, Lexapro 20 mg daily, to trazodone 100 mg nightly, Jornay 60 mg PM Wants to increase Jornay to 80 mg PM to increase benefit for ADD and depression. No SE She has experienced a major family crisis that threatens to change her daily life from now on.  It has not triggered suicidal thoughts at this time but she is very afraid.  No desire to change medications.  08/04/22 appt noted" Patient was administered Spravato 84 mg intranasally today.  The patient experienced the typical dissociation which gradually resolved over the 2-hour period of observation.  There were no complications.  Specifically the patient did not have nausea or vomiting or headache.  Specifically HA is less of a problem with Spravato. Other psych meds: Clonazepam 0.5 mg tablets 2 nightly, gabapentin dosage varies, Dayvigo 10 mg nightly, Lexapro 20 mg daily, trazodone 100 mg nightly, Jornay 80 mg PM No SE No benefit or SE with increase Jormay 80 mg.  Says Concerta helped ADD and depression but not Jornay.  However duration was insufficient.  Still depressed and wants change to another form of stimulant.  No concerns with other meds. Continues with strong family stressors creating uncertainty about her future but no quick resolution. No SI Trial Cotempla 17.3 and DC Ritalin & Jornay 80  08/11/2022 appointment noted: Patient was administered Spravato 84 mg intranasally today.  The patient experienced the typical dissociation which gradually resolved over the 2-hour period of observation.  There were no complications.  Specifically the patient did not have nausea or vomiting or headache.  Specifically HA is less of a problem with Spravato now  vs when started it.. Other psych meds: Clonazepam 0.5 mg tablets 2 nightly, gabapentin dosage varies, Dayvigo 10 mg nightly, trazodone 100 mg nightly, cotempla 17.3, switch from Lexapro 20 to sertaline 15o mg . No noticeable effects sertraline from for depression but has seen some antianxiety effects from the sertraline.  No side effects noted.  No side effects with the other medicines.  Still is only getting very brief benefit 3 to 4 hours from Cotempla for ADD and mood.  She wants to try the Daytrana patch as we have discussed before. No SE  08/13/22 appt: Patient was administered Spravato 84 mg intranasally today.  The patient experienced the typical dissociation which gradually resolved over the 2-hour period of observation.  There were no  complications.  Specifically the patient did not have nausea or vomiting or headache.  Specifically HA is less of a problem with Spravato now vs when started it.. Other psych meds: Clonazepam 0.5 mg tablets 2 nightly, gabapentin dosage varies, Dayvigo 10 mg nightly, trazodone 100 mg nightly, cotempla 17.3, switch from Lexapro 20 to sertaline 15o mg . No noticeable effects sertraline from for depression but has seen some antianxiety effects from the sertraline.  No side effects noted.  No side effects with the other medicines.  Still is only getting very brief benefit 3 to 4 hours from Cotempla for ADD and mood.  She wants to try the Daytrana patch as we have discussed before.  Hasn't been able to get it yet. No SE Plan.  Continue to try to get Daytrana 30 AM  08/18/22 appt noted: Patient was administered Spravato 84 mg intranasally today.  The patient experienced the typical dissociation which gradually resolved over the 2-hour period of observation.  There were no complications.  Specifically the patient did not have nausea or vomiting or headache.  Specifically HA is less of a problem with Spravato now vs when started it.. Other psych meds: Clonazepam 0.5 mg  tablets 2 nightly, gabapentin dosage varies, Dayvigo 10 mg nightly, trazodone 100 mg nightly, cotempla 17.3, switch from Lexapro 20 to sertaline 15o mg . No noticeable effects sertraline from for depression but has seen some antianxiety effects from the sertraline.  No side effects noted.  No side effects with the other medicines.  Still is only getting very brief benefit 3 to 4 hours from Cotempla for ADD and mood.  She wants to try the Daytrana patch as we have discussed before.  Hasn't been able to get it yet.  Overall depression is some better than it was.   No SE Plan.  Continue to try to get Daytrana 30 AM  08/25/22 appt : No benefit Daytrana.  No SE.  Wants further med changes and interested In retrying pramipexole.   Tolerating meds. Patient was administered Spravato 84 mg intranasally today.  The patient experienced the typical dissociation which gradually resolved over the 2-hour period of observation.  There were no complications.  Specifically the patient did not have nausea or vomiting or headache.  Specifically HA is less of a problem with Spravato now vs when started it.. Other psych meds: Clonazepam 0.5 mg tablets 2 nightly, gabapentin dosage varies, Dayvigo 10 mg nightly, trazodone 100 mg nightly, , switch from Lexapro 20 to sertaline 15o mg . Daytrana 30 AM for a few days. Struggles with depression and no SI.  Reduced interest and mood and motivation and enjoyment and socialization.  08/27/22 appt noted: Psych meds:  Clonazepam 0.5 mg tablets 2 nightly, gabapentin dosage varies, Dayvigo 10 mg nightly, trazodone 100 mg nightly,  sertaline 15o mg . Returned to [employer] AM, pramipexole  Tolerating meds. Patient was administered Spravato 84 mg intranasally today.  The patient experienced the typical dissociation which gradually resolved over the 2-hour period of observation.  There were no complications.  Specifically the patient did not have nausea or vomiting or headache.   Reports  taking cotempla bc brief mood benefit and sig focus and productivity benefit. Took 1 dose pramipexole and felt more dep.  09/01/22 appt noted: Psych meds:  Clonazepam 0.5 mg tablets 2 nightly, gabapentin dosage varies, Dayvigo 10 mg nightly, trazodone 100 mg nightly,  sertaline 15o mg . Returned to Cotempla AM, pramipexole  0.25 mg BID. Tolerating meds now. Patient  was administered Spravato 84 mg intranasally today.  The patient experienced the typical dissociation which gradually resolved over the 2-hour period of observation.  There were no complications.  Specifically the patient did not have nausea or vomiting or headache.   Willing to try the pramipexole and will continue 0.25 mg BID this week and then consider increase if tolerated.  Mood is a little better.  Still looking for further improvement.  09/03/22 appt noted: Psych meds:  Clonazepam 0.5 mg tablets 2 nightly, gabapentin dosage varies, Dayvigo 10 mg nightly, trazodone 100 mg nightly,  sertaline 15o mg . Returned to Cotempla AM, pramipexole  0.25 mg tablet 1 and 1/2 tablets twice daily BID. Tolerating meds now. Patient was administered Spravato 84 mg intranasally today.  The patient experienced the typical dissociation which gradually resolved over the 2-hour period of observation.  There were no complications.  Specifically the patient did not have nausea or vomiting or headache.   Depression may be a little better with the parmipexole and is tolerating it well so far.  Still struggling with focus and residual depression.  Tolerating meds without SE.  More hopeful and able to enjoy some things.  09/08/22 appt  Psych meds:  Clonazepam 0.5 mg tablets 2 nightly, gabapentin dosage varies, Dayvigo 10 mg nightly, trazodone 100 mg nightly,  sertaline 15o mg . Returned to Cotempla AM, pramipexole  0.5 mg BID. Tolerating meds now. Patient was administered Spravato 84 mg intranasally today.  The patient experienced the typical dissociation which  gradually resolved over the 2-hour period of observation.  There were no complications.  Specifically the patient did not have nausea or vomiting or headache.   She has seen her depression drop from a 10/10 prior to treatment with Spravato to now 4/10 With additional benefit noted when she increased the pramipexole to 0.5 mg twice daily.  She is not having side effects with this like she did in the past. Anxiety levels are also improved. She is sleeping okay with the current medications. Plan: continue pramipexole trial off-label and increase more slowly for TRD.  Increase  Gradually  to 0.75 mg BID  09/11/22 appt noted: Psych meds:  Clonazepam 0.5 mg tablets 2 nightly, gabapentin dosage varies, Dayvigo 10 mg nightly, trazodone 100 mg nightly,  sertaline 15o mg . Returned to Cotempla AM, pramipexole  0.75 mg BID. Tolerating meds now. Patient was administered Spravato 84 mg intranasally today.  The patient experienced the typical dissociation which gradually resolved over the 2-hour period of observation.  There were no complications.  Specifically the patient did not have nausea or vomiting or headache.   She has seen her depression drop from a 10/10 prior to treatment with Spravato to now 4/10 With additional benefit noted when she increased the pramipexole to 0.5 mg twice daily.  She is not having side effects with this like she did in the past. Clearly improving with the pramipexole now and tolerating it.  Satisfied with current meds but would consider increasing if it might be helpful.  09/15/22 appt noted: Psych meds:  Clonazepam 0.5 mg tablets 2 nightly, gabapentin dosage varies, Dayvigo 10 mg nightly, trazodone 100 mg nightly,  sertaline 15o mg . Returned to Cotempla AM, pramipexole  0.75 mg BID too anxious and reduced to 0.5 mg BID. Tolerating meds now. Patient was administered Spravato 84 mg intranasally today.  The patient experienced the typical dissociation which gradually resolved over  the 2-hour period of observation.  There were no complications.  Specifically  the patient did not have nausea or vomiting or headache.   Trouble staying asleep longer than 6 hours.  Doesn't feel she can tolerate pramipexole 0.75 mg BID DT anxiety not experienced at  0.5 mg BID.  Otherwise tolerating meds.  Mood is better with Spravato and pramipexole.   Not sleeping as long with meds.  EMA 6 hours.  Wonders about change Disc concerns about waiting for therapy. Feels ready to reduce Spravato to weekly.    09/25/22 appt noted: Psych meds:  Clonazepam 0.5 mg tablets 2 nightly, gabapentin dosage varies, Dayvigo 10 mg nightly, trazodone 100 mg nightly,  sertaline 15o mg . Returned to Cotempla AM, pramipexole  0.75 mg BID too anxious and reduced to 0.5 mg BID. Tolerating meds now. Patient was administered Spravato 84 mg intranasally today.  The patient experienced the typical dissociation which gradually resolved over the 2-hour period of observation.  There were no complications.  Specifically the patient did not have nausea or vomiting or headache.   Trouble staying asleep longer than 6 hours.  Failed to respond to Seroquel 25 mg HS.  Gone back to [facility].  Belsomra NR. Mood ok until the last few days more dep bc it's been 10 days since last admin.  Wants to continue weekly.   No SE.  No med changes desired.  09/29/22 appt noted: Psych meds:  Clonazepam 0.5 mg tablets 2 nightly, gabapentin dosage varies, Dayvigo 10 mg nightly, trazodone 100 mg nightly,  sertaline 15o mg . Returned to Cotempla AM, pramipexole  0.75 mg BID too anxious and reduced to 0.5 mg BID. Tolerating meds now. Patient was administered Spravato 84 mg intranasally today.  The patient experienced the typical dissociation which gradually resolved over the 2-hour period of observation.  There were no complications.  Specifically the patient did not have nausea or vomiting or headache.   Trouble staying asleep longer than 6 hours.  Plan:  Continue sertaline to 200 mg daily.  It has helped anxiety but not depression.   for insomnia, clonazepam 1 mg HS. trazodone doesn't work well but helps some Continue Dayvigo 10 HS.  Failed resp Belsomra 15 and seroquel 25 She wants to continue Cotempla or Concerta bc helps mood early in day and focus  benefit lasts longer.  Have not been able to get stimulant to be consitently effective throughout the day. continue pramipexole trial off-label and reduce back to  TRD 0.5 mg BID Spravato weekly.  10/13/22 appt noted: Not sleeping as well.  Wants to increase Spravato to twice weekly bc feeling more dep close to the next sched date.  Just the day or 2 before.  .  Plan: Trial for off label TRD increase pramipexole 1 mg TID  10/20/22 appt noted: Psych meds:  Clonazepam 0.5 mg tablets 2 nightly, gabapentin dosage varies, Dayvigo 10 mg nightly, trazodone 100 mg nightly,  sertaline 15o mg . Returned to Cotempla AM, pramipexole  1 mg TID. Tolerating meds now. Patient was administered Spravato 84 mg intranasally today.  The patient experienced the typical dissociation which gradually resolved over the 2-hour period of observation.  There were no complications.  Specifically the patient did not have nausea or vomiting or headache.   Trouble staying asleep longer than 6 hours.  More dep this week and asked to get Spravato twice this week. Plan: Trial for off label TRD increase pramipexole 1 mg QID  10/22/22 appt noted:  Psych meds:  Clonazepam 0.5 mg tablets 2 nightly and another one with  EMA, gabapentin dosage varies, Belsomra 20 mg daily,  trazodone 100 mg nightly,  sertaline 15o mg . Returned to Cotempla AM, pramipexole  1 mg QID for 2nd day Tolerating meds now.  Except a little N and dizzy briefly after takes it. Patient was administered Spravato 84 mg intranasally today.  The patient experienced the typical dissociation which gradually resolved over the 2-hour period of observation.  There were no  complications.  Specifically the patient did not have nausea or vomiting or headache. No change in mood yet.   Sleep better with Belsomra 20 as long as takes with other meds.  If insomnia mood is worse. Ongoing some dep but gets partial relief with Spravato but would like better relief .  10/27/22 received Spravato 84  11/03/22 appt noted:  Meds as above except sertraline she cut to 100 mg instead of 200 mg daily She doesn't think sertraline helping and worries over poss SE Off and on Cotempla .  Not affecting BP. Doesn't notice benefit or SE with pramipexole 1 mg QID.  No SE Sleep is OK.  Some awakening aftter 6 hours but not enough so takes another clonazepam.   Patient was administered Spravato 84 mg intranasally today.  The patient experienced the typical dissociation which gradually resolved over the 2-hour period of observation.  There were no complications.  Specifically the patient did not have nausea or vomiting or headache. No change in mood yet.   Sleep better with Belsomra 20 as long as takes with other meds.  If insomnia mood is worse. Ongoing some dep but gets partial relief with Spravato but would like better relief .  No sig benefit with pramipexole 1 mg QID.  11/21/22 TC:  To ER with dep 11/21/22. No SE with selegiline but feels more dep.  Wants to stop it. Today is only day 2 of 1 tablet of selegiline 5 mg AM.  Reviewed with her that she had not been satisfied with the response of Spravato and Zoloft and that she is likely feeling worse because the Zoloft is wearing off and in particular because she abruptly stopped 150 mg daily.  She needs to give the selegiline time to work.  She agrees to do so.  She admitted to some passive suicidal thoughts but no active suicidal thoughts and no desire to die.  She agrees to the plan to increase selegiline to 10 mg every morning this weekend and to continue the selegiline trial.  She cannot stop this and immediately start another AD due to risk  DDI with serotonin syndrome.  She is aware.  Meredith Staggers, MD, DFAPA  12/19/22 TC: Pt called at 1:42p.  She said she wanted to know if she could start weaning off the Selegiline.  She thinks increasing the dosage is making things worse.  She said she wants to go back to [employer].  She acknowleged she has an appt on Monday. I advised her that I wasn't sure Dr Jennelle Human will see her message before her appt, but she still ask me to send the message.  Next appt 8/26 MD resp.  No change until Monday appt.  12/22/22 appt noted: Meds: selegiline up to 5 mg 4 daily for a week but felt it incr irritability and went back to 3 mg daily. She wants to go back to Spravato.  Not near as dep as now.  Selegiline didn't help energy. Still on Belsomra Thinks maybe sertraline helped mood more than she thought. More dep than anxiety.  Plan: DC selegiline and will retry Effexor which helped in the past at 300 mg daily.  Start this Friday after over 10 half lives of selegiline.    12/24/22 appt noted: Stopped selegiline  Psych med: belsomra 20, clonazepam 1 mg HS.   She has felt more dep off the Spravato though at the time didn't think it was helping.  Has felt more down and withdrawn.  Low energy, motivation, not smiling.  No SI.  Sleep ok.   Patient was administered Spravato 84 mg intranasally today.  The patient experienced the typical dissociation which gradually resolved over the 2-hour period of observation.  There were no complications.  Specifically the patient did not have nausea or vomiting or headache. No problems with BP or other unusual SE Mood is a little better after this administration of Spravato.  Very motivated to continue it.  No immediate med concerns. No SI .  Ongoing anhedonia.  12/25/22 appt noted: Psych med: belsomra 20, clonazepam 1 mg HS.  Not resumed stimulant or AD yet   No SE. Patient was administered Spravato 84 mg intranasally today.  The patient experienced the typical dissociation  which gradually resolved over the 2-hour period of observation.  There were no complications.  Specifically the patient did not have nausea or vomiting or headache.  There is a lot things going on in her life and it is somewhat difficult to separate stressors from what is going on with her mood.  However she is happy to resume Spravato as she decided after stopping briefly that it was helpful.  She plans to start the antidepressant Morrow.  She also wants to resume the stimulant tomorrow.  AIMS    Flowsheet Row Video Visit from 01/03/2022 in [facility]  AIMS Total Score 0      ECT-MADRS    Flowsheet Row Clinical Support from 08/13/2022 in [facility] Video Visit from 04/29/2022 in [facility] Video Visit from 02/06/2022 in [facility]  MADRS Total Score 27 44 23      GAD-7    Flowsheet Row Counselor from 12/19/2022 in [facility] Office Visit from 08/22/2022 in [facility] [place]  Total GAD-7 Score 4 7      PHQ2-9    Flowsheet Row Counselor from 12/19/2022 in [facility] Office Visit from 08/22/2022 in [facility] [place] Office Visit from 08/19/2022 in [facility] for [facility] at [facility] Video Visit from 02/06/2022 in [facility] Office Visit from 11/16/2020 in [facility] for [facility] Healthcare at [facility] Total Score 2 3 0 6 0  PHQ-9 Total Score 5 5 -- 14 --        Past Psychiatric Medication Trials: Trintellix - Initially effective and then was not effective when re-started Paxil- Ineffective. Helped initially. Prozac-Insomnia Lexapro- Effective and then no longer as effective Zoloft- Initially effective and then minimal response Viibryd Cymbalta- Ineffective. Helped  initially. Effexor XR 300- Effective, well tolerated. Pristiq- Increased anxiety at 150 mg daily Wellbutrin XL-Ineffective.May have increased anxiety. Amitriptyline Nortriptyline-Caused irritability, constipation Auvelity- ineffective Selegilne brief 20 mg daily , more irritable  Abilify- Helpful but caused severe insomnia. Rexulti-Helpful but caused insomnia. Vraylar Seroquel - Ineffective Risperdal Olanzapine- Ineffective Latuda- Ineffective Caplyta  Klonopin- Effective Temazepam- Took during menopause Lunesta- Ineffective Ambien-Ineffective Sonata- Ineffective Dayvigo- partially effective Failed resp Belsomra  15 and seroquel 25 Trazodone-  somewhat effective Remeron-Ineffective. Does not recall wt gain.  Quetiapine 25 NR  Adderall- Took once and had adverse effect. Concerta- Effective but only 4-6 hours Jornay 80 NR Metadate-Not as effective as Concerta Dexmethylphenidate- Not as effective as Concerta Azstarys- some side effects. Not as effective.  Ritalin short acting and SE anxiety & HTN @20  TID Cotemplay 17.3  Daytrana  Lamictal- May have had an adverse effects. "Felt weird." Reports worsening s/s.  Lithium ? effect Gabapentin Pramipexole- 1 mg QID NR  ECT #20 with minimal response. H says it was partly helpful.  AIMS    Flowsheet Row Video Visit from 01/03/2022 in [facility]  AIMS Total Score 0      ECT-MADRS    Flowsheet Row Clinical Support from 08/13/2022 in [facility] Video Visit from 04/29/2022 in [facility] Video Visit from 02/06/2022 in [facility]  MADRS Total Score 27 44 23      GAD-7    Flowsheet Row Counselor from 12/19/2022 in [facility] Office Visit from 08/22/2022 in [facility] at [place]  Total GAD-7 Score 4 7      PHQ2-9    Flowsheet Row Counselor from 12/19/2022  in [facility] Office Visit from 08/22/2022 in [facility] [place] Office Visit from 08/19/2022 in [facility] for [facility] Healthcare at [facility] Video Visit from 02/06/2022 in [facility] Office Visit from 11/16/2020 in [facility] for [facility] at [facility]  PHQ-2 Total Score 2 3 0 6 0  PHQ-9 Total Score 5 5 -- 14 --        Review of Systems:  Review of Systems  Cardiovascular:  Negative for palpitations.  Neurological:  Negative for dizziness and weakness.  Psychiatric/Behavioral:  Positive for dysphoric mood and sleep disturbance. Negative for agitation and decreased concentration. The patient is nervous/anxious.     Medications: I have reviewed the patient's current medications.  Current Outpatient Medications  Medication Sig Dispense Refill   clonazePAM (KLONOPIN) 0.5 MG tablet 2 tablets at night and 1 tablet as needed daily for panic anxiety (Patient taking differently: Take 1 mg by mouth at bedtime.) 75 tablet 1   Esketamine HCl, 84 MG Dose, (SPRAVATO, 84 MG DOSE,) 28 MG/DEVICE SOPK Place 84 mg into the nose every 3 (three) days. 3 each 1   Estradiol (VAGIFEM) 10 MCG TABS vaginal tablet Place 1 tablet (10 mcg total) vaginally 2 (two) times a week. 24 tablet 3   Suvorexant (BELSOMRA) 20 MG TABS TAKE 1 TABLET BY MOUTH AT BEDTIME 30 tablet 2   valACYclovir (VALTREX) 500 MG tablet Take one twice daily for 3 days with any symptoms 30 tablet 3   valsartan (DIOVAN) 80 MG tablet Take 1 tablet (80 mg total) by mouth daily. 90 tablet 3   venlafaxine XR (EFFEXOR XR) 37.5 MG 24 hr capsule 1 capsule daily for 4 days, then 2 capsules daily (Patient not taking: Reported on 12/25/2022) 30 capsule 0   No current facility-administered medications for this visit.    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   TRANSAMINASES, SERUM, ELEVATED 08/14/2009    Past Medical History, Surgical history, Social history, and Family history were reviewed and updated as appropriate.   Please see review of systems for further details on the patient's review from today.   Objective:   Physical Exam:  LMP 12/27/2007 (Exact Date)   Physical Exam Constitutional:      General: She is not in acute distress. Musculoskeletal:        General: No deformity.  Neurological:     Mental Status: She is alert and oriented to person, place, and time.     Coordination: Coordination normal.  Psychiatric:        Attention and Perception: Attention and perception normal. She does not perceive auditory hallucinations.        Mood and Affect: Mood is anxious and depressed. Affect is blunt.        Speech: Speech normal. Speech is not slurred.        Behavior: Behavior normal.        Thought Content: Thought content normal. Thought content is not delusional. Thought content does not include homicidal or suicidal ideation. Thought content does not include suicidal plan.        Cognition and Memory: Cognition and memory normal.        Judgment: Judgment normal.     Comments: Insight intact Mood still mod dep  Affect blunted     Lab Review:     Component Value Date/Time   NA 132 (L) 10/27/2022 1107   K 4.4 10/27/2022 1107   CL 95 (L) 10/27/2022 1107   CO2 30 10/27/2022 1107   GLUCOSE 89 10/27/2022 1107   BUN 14 10/27/2022 1107   CREATININE 0.67 10/27/2022 1107   CREATININE 0.68 01/26/2020 0920   CALCIUM 10.1 10/27/2022 1107   PROT 8.3 04/22/2022 0932   ALBUMIN 5.3 (H) 04/22/2022 0932   AST 82 (H) 04/22/2022 0932   ALT  118 (H) 04/22/2022 0932   ALKPHOS 74 04/22/2022 0932   BILITOT 0.3 04/22/2022 0932   GFRNONAA >90 05/01/2014 1645   GFRAA >90 05/01/2014 1645       Component Value Date/Time   WBC 3.3 (L) 04/22/2022 0932   RBC 4.25 04/22/2022 0932   HGB 14.3 04/22/2022 0932   HGB 14.1 10/26/2014 1034   HCT 40.7 04/22/2022 0932   PLT 245.0 04/22/2022 0932   MCV 95.7 04/22/2022 0932   MCH 34.4 (H) 01/26/2020 0920   MCHC 35.1 04/22/2022 0932   RDW 13.2 04/22/2022 0932   LYMPHSABS 0.9 04/22/2022 0932   MONOABS 0.2 04/22/2022 0932   EOSABS 0.1 04/22/2022 0932   BASOSABS 0.0 04/22/2022 0932    No results found for: "POCLITH", "LITHIUM"   No results found for: "PHENYTOIN", "PHENOBARB", "VALPROATE", "CBMZ"   .res Assessment: Plan:    Trillian was seen today for follow-up, depression, anxiety, sleeping problem and fatigue.  Diagnoses and all orders for this visit:  Recurrent major depression resistant to treatment [facility])  Generalized anxiety disorder  Attention deficit hyperactivity disorder (ADHD), predominantly inattentive type  Insomnia, unspecified type  Stressful life event affecting family    Pt seen for TRD  Stopped  selegiline DT irritability.  Disc importance of no med changes between appts unless emergency.  BC TRD need 6 week trial of AD at full dose at least.  Some of the failed trials are DT inadequate duration.    Consider switch Spravato to MAOI, Parnate  We discussed the short-term risks associated with benzodiazepines including sedation and increased fall risk among others.  Discussed long-term side effect risk including dependence, potential withdrawal symptoms, and the potential eventual dose-related risk of dementia.  But recent studies from 2020 dispute this association between benzodiazepines and dementia risk. Newer studies in 2020 do not support an association with dementia.  Disc compliance issues.  She sometimes has trouble staying on a med long enough to get  benefit.  for insomnia, clonazepam 1 mg HS.  Some better so far with Belsomra 20 mg HS  She wants to resume Cotempla or Concerta bc helps mood early in day and focus  benefit lasts longer.  Have not been able to get stimulant to be consitently effective throughout the day.  DC selegiline.  Half life 10 hours so can start Venlavaxine on Friday.  Do not take earlier DT risk SS  Resumed Spravato  Patient was administered Spravato 84 mg intranasally today.  The patient experienced the typical dissociation which gradually resolved over the 2-hour period of observation.  There were no complications.  Specifically the patient did not have nausea or vomiting or headache.  Blood pressures remained within normal ranges at the 40-minute and 2-hour follow-up intervals.  By the time the 2-hour observation period was met the patient was alert and oriented and able to exit without assistance.  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  See nursing note for further details.  FU weekly Spravato .  Meredith Staggers, MD, DFAPA    Please see After Visit Summary for patient specific instructions.  Future Appointments  Date Time Provider Department Center  12/26/2022  2:00 PM Gaspar Bidding, [facility] None  01/05/2023  2:00 PM Gaspar Bidding, [facility] CP-CP None  02/09/2023  1:00 PM Gaspar Bidding, [facility] CP-CP None  02/16/2023  1:00 PM Gaspar Bidding, [facility] CP-CP None  02/23/2023  1:00 PM Gaspar Bidding, [facility] CP-CP None  09/18/2023  8:15 AM Jerene Bears, MD [facility]    No orders of the defined types were placed in this encounter.   -------------------------------

## 2022-12-26 ENCOUNTER — Encounter: Payer: Self-pay | Admitting: Professional Counselor

## 2022-12-26 ENCOUNTER — Ambulatory Visit (INDEPENDENT_AMBULATORY_CARE_PROVIDER_SITE_OTHER): Payer: 59 | Admitting: Professional Counselor

## 2022-12-26 DIAGNOSIS — F339 Major depressive disorder, recurrent, unspecified: Secondary | ICD-10-CM

## 2022-12-26 DIAGNOSIS — F411 Generalized anxiety disorder: Secondary | ICD-10-CM

## 2022-12-26 NOTE — Progress Notes (Signed)
      [facility] Counselor/Therapist Progress Note  Patient ID: Heather Davila, MRN: [id number],    Date: 12/26/2022  Time Spent: 2:07p - 3:03p  Treatment Type: Individual Therapy  Reported Symptoms: worries, fears, sadness, stress, grief/loss  Mental Status Exam:  Appearance:   Neat     Behavior:  Appropriate, Sharing, and Motivated  Motor:  Normal  Speech/Language:   Clear and Coherent and Normal Rate  Affect:  Appropriate and Congruent  Mood:  normal  Thought process:  normal  Thought content:    WNL  Sensory/Perceptual disturbances:    WNL  Orientation:  oriented to person, place, time/date, and situation  Attention:  Good  Concentration:  Good  Memory:  WNL  Fund of knowledge:   Good  Insight:    Good  Judgment:   Good  Impulse Control:  Good   Risk Assessment: Danger to Self:  No Self-injurious Behavior: No Danger to Others: No Duty to Warn:no Physical Aggression / Violence:No  Access to Firearms a concern: No  Gang Involvement:No   Subjective: Pt presented to session voicing frustration with depression symptoms and fear that they will not resolve. She processed unresolved grief as relates mother, and phase of life in regards to retirement and difficulty adjusting to loss of role. Counselor facilitated Brief Grief Questionnaire and expanded version regarding mother's loss, and discussed results with pt. Results did not indicate complicated bereavement, however counselor and pt discussed pt experience of longing, lament, unmet needs and guilt that she feels. Counselor actively listened, affirmed pt, provided psychoeducation, and helped pt to gain insight and to resource inner strengths.    Interventions: Solution-Oriented/Positive Psychology, Humanistic/Existential, Grief Therapy, and Insight-Oriented  Diagnosis:   ICD-10-CM   1. 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 Generalized anxiety disorder  F41.1       Plan: Pt is  scheduled for follow-up; continue process work and developing coping skills.   Gaspar Bidding, [facility]

## 2022-12-30 ENCOUNTER — Ambulatory Visit: Payer: 59

## 2022-12-30 ENCOUNTER — Ambulatory Visit (INDEPENDENT_AMBULATORY_CARE_PROVIDER_SITE_OTHER): Payer: 59 | Admitting: Psychiatry

## 2022-12-30 VITALS — BP 126/81 | HR 58

## 2022-12-30 DIAGNOSIS — F339 Major depressive disorder, recurrent, unspecified: Secondary | ICD-10-CM

## 2022-12-30 DIAGNOSIS — F411 Generalized anxiety disorder: Secondary | ICD-10-CM

## 2022-12-30 DIAGNOSIS — F900 Attention-deficit hyperactivity disorder, predominantly inattentive type: Secondary | ICD-10-CM

## 2022-12-30 DIAGNOSIS — G4700 Insomnia, unspecified: Secondary | ICD-10-CM | POA: Diagnosis not present

## 2022-12-30 DIAGNOSIS — Z6379 Other stressful life events affecting family and household: Secondary | ICD-10-CM

## 2022-12-30 NOTE — Progress Notes (Signed)
NURSES NOTE:           Patient arrived for her #46 Spravato treatment. Pt will begin twice a week treatments for at least 4 weeks then reassess on her depression and symptoms at that time. Pt is being treated for Treatment Resistant Depression, pt will be receiving 84 mg (3 of the 28 mg) nasal sprays, this is her maintenance dose and we will start twice a week treatments. Patient taken to treatment room, I explained and discussed her treatment and how the schedule should go, along with any side effects that may occur. Answered any questions and concerns the patient had. Pt's Spravato is delivered through [place] and she is now billing with her insurance. Spravato medication is stored at doctors office per REMS/FDA guidelines. The medication is required to be locked behind two doors per FDA/REMS Protocol. Medication is also disposed of properly per regulations. All treatments and her vital signs are documented in Spravato REMS per protocol of treatment center regulations. [facility] was notified pt is restarting, and will need her Spravato delivered again on a regular basis. No new PA is needed at this time.      Vital Signs assessed at 8:00 AM 116/70, pulse 56, pulse ox 92%. Instructed pt to blow her nose and recline back slightly to prevent any of medication dripping out of her nose. Informed pt since its been over a month since her last treatment she may feel the medication a lot stronger.  Pt. Given 1st dose (28 mg inhaler), then 5 minutes between 2nd dose and 3rd dose for total of 84 mg. No complaints of nausea/vomiting reported. Pt did listen to music today, she just reclined back and closed her eyes. After 40 minutes I went to assess her vital signs, no side effects or symptoms reported, B/P 114/67, pulse 58. Dr. Jennelle Human talked to pt today about her treatment. Nurse was with pt a total of 60 minutes but pt observed for 120 minutes per protocol. Discharge vital signs were at 9:55 AM, B/P 126/81,  pulse 58. She verbalized understanding. She reports dissociation but she was clear upon her discharge. She is scheduled on Thursday, September 5th.  She is instructed to contact office if needing anything prior to her next treatment.     LOT # [id number]   EXP APR 2027

## 2022-12-30 NOTE — Progress Notes (Addendum)
Heather Davila [id number] April 30, 1960 62 y.o.  Subjective:   Patient ID:  Heather Davila is a 62 y.o. (DOB 30-Jul-1960) female.  Chief Complaint:  Chief Complaint  Patient presents with   Follow-up   Depression   Anxiety   Sleeping Problem    HPI Heather Davila presents to the office today for follow-up of treatment resistant recurrent depression, generalized anxiety disorder and insomnia.  Almost too numerous to count med failures. She is here for Spravato administration for her treatment resistant depression.  05/21/22 appt noted: Patient was administered first dose Spravato 56 mg intranasally today.  The patient experienced the typical dissociation which gradually resolved over the 2-hour period of observation.  There were no complications.  Specifically the patient did not have nausea or vomiting or headache.   Dissociation was mild. Did not experience any emotional relief from disabling depression.wants to increase Spravato to the usual dose.  She is aware insurance is not covering the costs at this time.  No SI acutely. Tolerating meds.    05/23/22 appt noted: Patient was administered Spravato 84 mg intranasally today.  The patient experienced the typical dissociation which gradually resolved over the 2-hour period of observation.  There were no complications.  Specifically the patient did not have nausea or vomiting.   Dissociation was greater than last dose and a little bothersome DT some HA. Tolerating meds.   Mood has not changed significantly thus far with Spravato.  05/27/22 appt noted: Patient was administered Spravato 84 mg intranasally today.  The patient experienced the typical dissociation which gradually resolved over the 2-hour period of observation.  There were no complications.  Specifically the patient did not have nausea or vomiting or headache. Today she has felt a little relief from the pressing heaviness of depression but a little more anxiety.  During  administration of Spravato she listens to [place] music and was able to relax a little more with the feeling of dissociation that was mildly bothersome. Husband was also in on the session today and asked some questions about IV ketamine versus nasal spray ketamine.  05/29/22 appt noted: Cancelled today DT hypertension  05/30/22 appt noted: Patient was administered Spravato 84 mg intranasally today.  The patient experienced the typical dissociation which gradually resolved over the 2-hour period of observation.  There were no complications.  Specifically the patient did not have nausea or vomiting or headache. She is seeing some improvement in mood with less continuous depression and less intensity.  Hopefulness is better.   No med complaints or SE  06/05/22 appt noted: Patient was administered Spravato 84 mg intranasally today.  The patient experienced the typical dissociation which gradually resolved over the 2-hour period of observation.  There were no complications.  Specifically the patient did not have nausea or vomiting or headache.  Specifically HA is less of a problem with Spravato. No SE with meds. Depression is improving with less intensity.  Intermittent anxiety easily.  More able to enjoy things.  No new concerns.  06/17/22 appt noted: Patient was administered Spravato 84 mg intranasally today.  The patient experienced the typical dissociation which gradually resolved over the 2-hour period of observation.  There were no complications.  Specifically the patient did not have nausea or vomiting or headache.  Specifically HA is less of a problem with Spravato. No SE with meds. Depression is improved 45% with less intensity.  Intermittent anxiety less easily.  More able to enjoy things.  No new concerns.  More active. Current meds:  increased clonazepam to about 1.5 mg HS DT recent insomnia, lexapro 20, Dayvigo 10 mg HS, concerta 36 mg AM, trazodone 100 mg HS.  06/20/22 appt noted: Current  meds: increased clonazepam to about 1.5 mg HS DT recent insomnia, lexapro 20, Dayvigo 10 mg HS, concerta 36 mg AM, trazodone 100 mg HS. Patient was administered Spravato 84 mg intranasally today.  The patient experienced the typical dissociation which gradually resolved over the 2-hour period of observation.  There were no complications.  Specifically the patient did not have nausea or vomiting or headache.  Specifically HA is less of a problem with Spravato. No SE with meds. Depression is improved 45% with less intensity.  Intermittent anxiety less easily.  More able to enjoy things.  No new concerns.  More active.  06/23/22 appt noted:  Current meds: increased clonazepam to about 1.5 mg HS DT recent insomnia, lexapro 20, Dayvigo 10 mg HS, concerta 36 mg AM, trazodone 100 mg HS. Patient was administered Spravato 84 mg intranasally today.  The patient experienced the typical dissociation which gradually resolved over the 2-hour period of observation.  There were no complications.  Specifically the patient did not have nausea or vomiting or headache.  Specifically HA is less of a problem with Spravato. No SE with meds. Intensity of Spravato dissociation varies.  Last week very intesne and this week more typical.  Depression is continuing to improve with better interest and activity and motivation.  Gradual improvement.  Wants to continue.  06/25/22 appt noted: Current meds: increased clonazepam to about 1.5 mg HS DT recent insomnia, lexapro 20, Dayvigo 10 mg HS, concerta 36 mg AM, trazodone 100 mg HS. Patient was administered Spravato 84 mg intranasally today.  The patient experienced the typical dissociation which gradually resolved over the 2-hour period of observation.  There were no complications.  Specifically the patient did not have nausea or vomiting or headache.  Specifically HA is less of a problem with Spravato. No SE with meds. Intensity of Spravato dissociation varies.  No scary and well  tolerated. Continues to gradually improve with Spravato.  She is more motivated and enjoying things more.  H sees progress.  Wants to continue twice weekly until gets maximum response.  Dep is not gone yet.  06/30/22 appt noted:  seen with H Current meds: increased clonazepam to about 1.5 mg HS DT recent insomnia, lexapro 20, Dayvigo 10 mg HS, concerta 36 mg AM, trazodone 100 mg HS. Patient was administered Spravato 84 mg intranasally today.  The patient experienced the typical dissociation which gradually resolved over the 2-hour period of observation.  There were no complications.  Specifically the patient did not have nausea or vomiting or headache.  Specifically HA is less of a problem with Spravato. No SE with meds. Intensity of Spravato dissociation varies.  No scary and well tolerated. Continues to gradually improve with Spravato.  She is more motivated and enjoying things more.  H sees even more progress than she does; more active and smiling.  Wants to continue twice weekly until gets maximum response.  Dep is 80% better.  07/07/22 appt noted: Current meds: increased clonazepam to about 1.5 mg HS DT recent insomnia, lexapro 20, Dayvigo 10 mg HS, concerta 36 mg AM, trazodone 100 mg HS. Patient was administered Spravato 84 mg intranasally today.  The patient experienced the typical dissociation which gradually resolved over the 2-hour period of observation.  There were no complications.  Specifically the patient did not have nausea or vomiting  or headache.  Specifically HA is less of a problem with Spravato. No SE with meds. Intensity of Spravato dissociation varies.  No scary and well tolerated.  Was more intese today. Overall mood is markedly better and please with meds. No changes desired.  07/09/22 appt noted: In transition from Lexapro to sertraline and missing Concerta bc CO crash from it after about 4 hours.  Disc these concerns.  She'll discuss also with Corie Chiquito, NP More dep the  last couple of days and concerned about reducing Spravato frequency while transition from one antidep to another.  Affect less positive. Patient was administered Spravato 84 mg intranasally today.  The patient experienced the typical dissociation which gradually resolved over the 2-hour period of observation.  There were no complications.  Specifically the patient did not have nausea or vomiting or headache.  Specifically HA is less of a problem with Spravato. No SE with meds. Intensity of Spravato dissociation varies.  No scary and well tolerated.   07/14/22 appt noted: Has been more depressed this week.  More trouble with sleep.  Taking clonazepam 1-1.5  of the 0.5 mg tablets.   Trazodone not helping much. She switched back from 200 sertraline to Lexapro 20.  No SE More trouble with motivation.  Some stress with son with special needs. Misses the benevit of Concerta but it was too short acting and crashed in 4-6 hours. Patient was administered Spravato 84 mg intranasally today.  The patient experienced the typical dissociation which gradually resolved over the 2-hour period of observation.  There were no complications.  Specifically the patient did not have nausea or vomiting or headache.  Specifically HA is less of a problem with Spravato. No SE with meds. Intensity of Spravato dissociation varies.  No scary and well tolerated.   07/17/22 appt noted: Patient was administered Spravato 84 mg intranasally today.  The patient experienced the typical dissociation which gradually resolved over the 2-hour period of observation.  There were no complications.  Specifically the patient did not have nausea or vomiting or headache.  Specifically HA is less of a problem with Spravato. No SE with meds. Intensity of Spravato dissociation varies.  Not scary and well tolerated.  Just got Jornay last night but wasn't sure how to take it.  Wants to start and this was discussed.  She felt MPH helped mood and attn but  didn't last long enough and had crash after a few hours on Concerta.  Better for 2 hours after each dose Ritalin 20 mg with mood but then it wore off.  BP up after 3rd dose 160/90 and then took H's amlodipine 5 and BP became normal.  07/22/22 appt noted: Patient was administered Spravato 84 mg intranasally today.  The patient experienced the typical dissociation which gradually resolved over the 2-hour period of observation.  There were no complications.  Specifically the patient did not have nausea or vomiting or headache.  Specifically HA is less of a problem with Spravato. No SE with meds. Intensity of Spravato dissociation varies.  Not scary and well tolerated.  Start Jornay 20 mg over the weekend and did not notice any particular effect good or bad.  Increased to 40 mg. Other psych meds: Clonazepam 0.5 mg tablets 2 nightly, gabapentin dosage varies, Dayvigo 10 mg nightly, Lexapro 20 mg daily, to trazodone 100 mg nightly  07/24/22 appt noted: Patient was administered Spravato 84 mg intranasally today.  The patient experienced the typical dissociation which gradually resolved over the 2-hour  period of observation.  There were no complications.  Specifically the patient did not have nausea or vomiting or headache.  Specifically HA is less of a problem with Spravato. No SE with meds. Intensity of Spravato dissociation varies.  Not scary and well tolerated.  Other psych meds: Clonazepam 0.5 mg tablets 2 nightly, gabapentin dosage varies, Dayvigo 10 mg nightly, Lexapro 20 mg daily, to trazodone 100 mg nightly, Jornay 40 mg PM Doing well with Spravato.  Noticed a little effect from the Jornay 40 without SE but would like to increase the dose for ADD and mood.  Sleeping well.  No new concerns.  Still more depressed than she was with th initial benefit of Spravato.  07/30/22 note:  Patient was administered Spravato 84 mg intranasally today.  The patient experienced the typical dissociation which gradually  resolved over the 2-hour period of observation.  There were no complications.  Specifically the patient did not have nausea or vomiting or headache.  Specifically HA is less of a problem with Spravato. Other psych meds: Clonazepam 0.5 mg tablets 2 nightly, gabapentin dosage varies, Dayvigo 10 mg nightly, Lexapro 20 mg daily, to trazodone 100 mg nightly, Jornay 60 mg PM Wants to increase Jornay to 80 mg PM to increase benefit for ADD and depression. No SE She has experienced a major family crisis that threatens to change her daily life from now on.  It has not triggered suicidal thoughts at this time but she is very afraid.  No desire to change medications.  08/04/22 appt noted" Patient was administered Spravato 84 mg intranasally today.  The patient experienced the typical dissociation which gradually resolved over the 2-hour period of observation.  There were no complications.  Specifically the patient did not have nausea or vomiting or headache.  Specifically HA is less of a problem with Spravato. Other psych meds: Clonazepam 0.5 mg tablets 2 nightly, gabapentin dosage varies, Dayvigo 10 mg nightly, Lexapro 20 mg daily, trazodone 100 mg nightly, Jornay 80 mg PM No SE No benefit or SE with increase Jormay 80 mg.  Says Concerta helped ADD and depression but not Jornay.  However duration was insufficient.  Still depressed and wants change to another form of stimulant.  No concerns with other meds. Continues with strong family stressors creating uncertainty about her future but no quick resolution. No SI Trial Cotempla 17.3 and DC Ritalin & Jornay 80  08/11/2022 appointment noted: Patient was administered Spravato 84 mg intranasally today.  The patient experienced the typical dissociation which gradually resolved over the 2-hour period of observation.  There were no complications.  Specifically the patient did not have nausea or vomiting or headache.  Specifically HA is less of a problem with Spravato now  vs when started it.. Other psych meds: Clonazepam 0.5 mg tablets 2 nightly, gabapentin dosage varies, Dayvigo 10 mg nightly, trazodone 100 mg nightly, cotempla 17.3, switch from Lexapro 20 to sertaline 15o mg . No noticeable effects sertraline from for depression but has seen some antianxiety effects from the sertraline.  No side effects noted.  No side effects with the other medicines.  Still is only getting very brief benefit 3 to 4 hours from Cotempla for ADD and mood.  She wants to try the Daytrana patch as we have discussed before. No SE  08/13/22 appt: Patient was administered Spravato 84 mg intranasally today.  The patient experienced the typical dissociation which gradually resolved over the 2-hour period of observation.  There were no complications.  Specifically  the patient did not have nausea or vomiting or headache.  Specifically HA is less of a problem with Spravato now vs when started it.. Other psych meds: Clonazepam 0.5 mg tablets 2 nightly, gabapentin dosage varies, Dayvigo 10 mg nightly, trazodone 100 mg nightly, cotempla 17.3, switch from Lexapro 20 to sertaline 15o mg . No noticeable effects sertraline from for depression but has seen some antianxiety effects from the sertraline.  No side effects noted.  No side effects with the other medicines.  Still is only getting very brief benefit 3 to 4 hours from Cotempla for ADD and mood.  She wants to try the Daytrana patch as we have discussed before.  Hasn't been able to get it yet. No SE Plan.  Continue to try to get Daytrana 30 AM  08/18/22 appt noted: Patient was administered Spravato 84 mg intranasally today.  The patient experienced the typical dissociation which gradually resolved over the 2-hour period of observation.  There were no complications.  Specifically the patient did not have nausea or vomiting or headache.  Specifically HA is less of a problem with Spravato now vs when started it.. Other psych meds: Clonazepam 0.5 mg  tablets 2 nightly, gabapentin dosage varies, Dayvigo 10 mg nightly, trazodone 100 mg nightly, cotempla 17.3, switch from Lexapro 20 to sertaline 15o mg . No noticeable effects sertraline from for depression but has seen some antianxiety effects from the sertraline.  No side effects noted.  No side effects with the other medicines.  Still is only getting very brief benefit 3 to 4 hours from Cotempla for ADD and mood.  She wants to try the Daytrana patch as we have discussed before.  Hasn't been able to get it yet.  Overall depression is some better than it was.   No SE Plan.  Continue to try to get Daytrana 30 AM  08/25/22 appt : No benefit Daytrana.  No SE.  Wants further med changes and interested In retrying pramipexole.   Tolerating meds. Patient was administered Spravato 84 mg intranasally today.  The patient experienced the typical dissociation which gradually resolved over the 2-hour period of observation.  There were no complications.  Specifically the patient did not have nausea or vomiting or headache.  Specifically HA is less of a problem with Spravato now vs when started it.. Other psych meds: Clonazepam 0.5 mg tablets 2 nightly, gabapentin dosage varies, Dayvigo 10 mg nightly, trazodone 100 mg nightly, , switch from Lexapro 20 to sertaline 15o mg . Daytrana 30 AM for a few days. Struggles with depression and no SI.  Reduced interest and mood and motivation and enjoyment and socialization.  08/27/22 appt noted: Psych meds:  Clonazepam 0.5 mg tablets 2 nightly, gabapentin dosage varies, Dayvigo 10 mg nightly, trazodone 100 mg nightly,  sertaline 15o mg . Returned to [employer] AM, pramipexole  Tolerating meds. Patient was administered Spravato 84 mg intranasally today.  The patient experienced the typical dissociation which gradually resolved over the 2-hour period of observation.  There were no complications.  Specifically the patient did not have nausea or vomiting or headache.   Reports  taking cotempla bc brief mood benefit and sig focus and productivity benefit. Took 1 dose pramipexole and felt more dep.  09/01/22 appt noted: Psych meds:  Clonazepam 0.5 mg tablets 2 nightly, gabapentin dosage varies, Dayvigo 10 mg nightly, trazodone 100 mg nightly,  sertaline 15o mg . Returned to Cotempla AM, pramipexole  0.25 mg BID. Tolerating meds now. Patient was administered Spravato  84 mg intranasally today.  The patient experienced the typical dissociation which gradually resolved over the 2-hour period of observation.  There were no complications.  Specifically the patient did not have nausea or vomiting or headache.   Willing to try the pramipexole and will continue 0.25 mg BID this week and then consider increase if tolerated.  Mood is a little better.  Still looking for further improvement.  09/03/22 appt noted: Psych meds:  Clonazepam 0.5 mg tablets 2 nightly, gabapentin dosage varies, Dayvigo 10 mg nightly, trazodone 100 mg nightly,  sertaline 15o mg . Returned to Cotempla AM, pramipexole  0.25 mg tablet 1 and 1/2 tablets twice daily BID. Tolerating meds now. Patient was administered Spravato 84 mg intranasally today.  The patient experienced the typical dissociation which gradually resolved over the 2-hour period of observation.  There were no complications.  Specifically the patient did not have nausea or vomiting or headache.   Depression may be a little better with the parmipexole and is tolerating it well so far.  Still struggling with focus and residual depression.  Tolerating meds without SE.  More hopeful and able to enjoy some things.  09/08/22 appt  Psych meds:  Clonazepam 0.5 mg tablets 2 nightly, gabapentin dosage varies, Dayvigo 10 mg nightly, trazodone 100 mg nightly,  sertaline 15o mg . Returned to Cotempla AM, pramipexole  0.5 mg BID. Tolerating meds now. Patient was administered Spravato 84 mg intranasally today.  The patient experienced the typical dissociation which  gradually resolved over the 2-hour period of observation.  There were no complications.  Specifically the patient did not have nausea or vomiting or headache.   She has seen her depression drop from a 10/10 prior to treatment with Spravato to now 4/10 With additional benefit noted when she increased the pramipexole to 0.5 mg twice daily.  She is not having side effects with this like she did in the past. Anxiety levels are also improved. She is sleeping okay with the current medications. Plan: continue pramipexole trial off-label and increase more slowly for TRD.  Increase  Gradually  to 0.75 mg BID  09/11/22 appt noted: Psych meds:  Clonazepam 0.5 mg tablets 2 nightly, gabapentin dosage varies, Dayvigo 10 mg nightly, trazodone 100 mg nightly,  sertaline 15o mg . Returned to Cotempla AM, pramipexole  0.75 mg BID. Tolerating meds now. Patient was administered Spravato 84 mg intranasally today.  The patient experienced the typical dissociation which gradually resolved over the 2-hour period of observation.  There were no complications.  Specifically the patient did not have nausea or vomiting or headache.   She has seen her depression drop from a 10/10 prior to treatment with Spravato to now 4/10 With additional benefit noted when she increased the pramipexole to 0.5 mg twice daily.  She is not having side effects with this like she did in the past. Clearly improving with the pramipexole now and tolerating it.  Satisfied with current meds but would consider increasing if it might be helpful.  09/15/22 appt noted: Psych meds:  Clonazepam 0.5 mg tablets 2 nightly, gabapentin dosage varies, Dayvigo 10 mg nightly, trazodone 100 mg nightly,  sertaline 15o mg . Returned to Cotempla AM, pramipexole  0.75 mg BID too anxious and reduced to 0.5 mg BID. Tolerating meds now. Patient was administered Spravato 84 mg intranasally today.  The patient experienced the typical dissociation which gradually resolved over  the 2-hour period of observation.  There were no complications.  Specifically the patient did  not have nausea or vomiting or headache.   Trouble staying asleep longer than 6 hours.  Doesn't feel she can tolerate pramipexole 0.75 mg BID DT anxiety not experienced at  0.5 mg BID.  Otherwise tolerating meds.  Mood is better with Spravato and pramipexole.   Not sleeping as long with meds.  EMA 6 hours.  Wonders about change Disc concerns about waiting for therapy. Feels ready to reduce Spravato to weekly.    09/25/22 appt noted: Psych meds:  Clonazepam 0.5 mg tablets 2 nightly, gabapentin dosage varies, Dayvigo 10 mg nightly, trazodone 100 mg nightly,  sertaline 15o mg . Returned to Cotempla AM, pramipexole  0.75 mg BID too anxious and reduced to 0.5 mg BID. Tolerating meds now. Patient was administered Spravato 84 mg intranasally today.  The patient experienced the typical dissociation which gradually resolved over the 2-hour period of observation.  There were no complications.  Specifically the patient did not have nausea or vomiting or headache.   Trouble staying asleep longer than 6 hours.  Failed to respond to Seroquel 25 mg HS.  Gone back to [facility].  Belsomra NR. Mood ok until the last few days more dep bc it's been 10 days since last admin.  Wants to continue weekly.   No SE.  No med changes desired.  09/29/22 appt noted: Psych meds:  Clonazepam 0.5 mg tablets 2 nightly, gabapentin dosage varies, Dayvigo 10 mg nightly, trazodone 100 mg nightly,  sertaline 15o mg . Returned to Cotempla AM, pramipexole  0.75 mg BID too anxious and reduced to 0.5 mg BID. Tolerating meds now. Patient was administered Spravato 84 mg intranasally today.  The patient experienced the typical dissociation which gradually resolved over the 2-hour period of observation.  There were no complications.  Specifically the patient did not have nausea or vomiting or headache.   Trouble staying asleep longer than 6 hours.  Plan:  Continue sertaline to 200 mg daily.  It has helped anxiety but not depression.   for insomnia, clonazepam 1 mg HS. trazodone doesn't work well but helps some Continue Dayvigo 10 HS.  Failed resp Belsomra 15 and seroquel 25 She wants to continue Cotempla or Concerta bc helps mood early in day and focus  benefit lasts longer.  Have not been able to get stimulant to be consitently effective throughout the day. continue pramipexole trial off-label and reduce back to  TRD 0.5 mg BID Spravato weekly.  10/13/22 appt noted: Not sleeping as well.  Wants to increase Spravato to twice weekly bc feeling more dep close to the next sched date.  Just the day or 2 before.  .  Plan: Trial for off label TRD increase pramipexole 1 mg TID  10/20/22 appt noted: Psych meds:  Clonazepam 0.5 mg tablets 2 nightly, gabapentin dosage varies, Dayvigo 10 mg nightly, trazodone 100 mg nightly,  sertaline 15o mg . Returned to Cotempla AM, pramipexole  1 mg TID. Tolerating meds now. Patient was administered Spravato 84 mg intranasally today.  The patient experienced the typical dissociation which gradually resolved over the 2-hour period of observation.  There were no complications.  Specifically the patient did not have nausea or vomiting or headache.   Trouble staying asleep longer than 6 hours.  More dep this week and asked to get Spravato twice this week. Plan: Trial for off label TRD increase pramipexole 1 mg QID  10/22/22 appt noted:  Psych meds:  Clonazepam 0.5 mg tablets 2 nightly and another one with EMA, gabapentin dosage  varies, Belsomra 20 mg daily,  trazodone 100 mg nightly,  sertaline 15o mg . Returned to Cotempla AM, pramipexole  1 mg QID for 2nd day Tolerating meds now.  Except a little N and dizzy briefly after takes it. Patient was administered Spravato 84 mg intranasally today.  The patient experienced the typical dissociation which gradually resolved over the 2-hour period of observation.  There were no  complications.  Specifically the patient did not have nausea or vomiting or headache. No change in mood yet.   Sleep better with Belsomra 20 as long as takes with other meds.  If insomnia mood is worse. Ongoing some dep but gets partial relief with Spravato but would like better relief .  10/27/22 received Spravato 84  11/03/22 appt noted:  Meds as above except sertraline she cut to 100 mg instead of 200 mg daily She doesn't think sertraline helping and worries over poss SE Off and on Cotempla .  Not affecting BP. Doesn't notice benefit or SE with pramipexole 1 mg QID.  No SE Sleep is OK.  Some awakening aftter 6 hours but not enough so takes another clonazepam.   Patient was administered Spravato 84 mg intranasally today.  The patient experienced the typical dissociation which gradually resolved over the 2-hour period of observation.  There were no complications.  Specifically the patient did not have nausea or vomiting or headache. No change in mood yet.   Sleep better with Belsomra 20 as long as takes with other meds.  If insomnia mood is worse. Ongoing some dep but gets partial relief with Spravato but would like better relief .  No sig benefit with pramipexole 1 mg QID.  11/21/22 TC:  To ER with dep 11/21/22. No SE with selegiline but feels more dep.  Wants to stop it. Today is only day 2 of 1 tablet of selegiline 5 mg AM.  Reviewed with her that she had not been satisfied with the response of Spravato and Zoloft and that she is likely feeling worse because the Zoloft is wearing off and in particular because she abruptly stopped 150 mg daily.  She needs to give the selegiline time to work.  She agrees to do so.  She admitted to some passive suicidal thoughts but no active suicidal thoughts and no desire to die.  She agrees to the plan to increase selegiline to 10 mg every morning this weekend and to continue the selegiline trial.  She cannot stop this and immediately start another AD due to risk  DDI with serotonin syndrome.  She is aware.  Meredith Staggers, MD, DFAPA  12/19/22 TC: Pt called at 1:42p.  She said she wanted to know if she could start weaning off the Selegiline.  She thinks increasing the dosage is making things worse.  She said she wants to go back to [employer].  She acknowleged she has an appt on Monday. I advised her that I wasn't sure Dr Jennelle Human will see her message before her appt, but she still ask me to send the message.  Next appt 8/26 MD resp.  No change until Monday appt.  12/22/22 appt noted: Meds: selegiline up to 5 mg 4 daily for a week but felt it incr irritability and went back to 3 mg daily. She wants to go back to Spravato.  Not near as dep as now.  Selegiline didn't help energy. Still on Belsomra Thinks maybe sertraline helped mood more than she thought. More dep than anxiety.   Plan: DC  selegiline and will retry Effexor which helped in the past at 300 mg daily.  Start this Friday after over 10 half lives of selegiline.    12/24/22 appt noted: Stopped selegiline  Psych med: belsomra 20, clonazepam 1 mg HS.   She has felt more dep off the Spravato though at the time didn't think it was helping.  Has felt more down and withdrawn.  Low energy, motivation, not smiling.  No SI.  Sleep ok.   Patient was administered Spravato 84 mg intranasally today.  The patient experienced the typical dissociation which gradually resolved over the 2-hour period of observation.  There were no complications.  Specifically the patient did not have nausea or vomiting or headache. No problems with BP or other unusual SE Mood is a little better after this administration of Spravato.  Very motivated to continue it.  No immediate med concerns. No SI .  Ongoing anhedonia.  12/25/22 appt noted: Psych med: belsomra 20, clonazepam 1 mg HS.  Not resumed stimulant or AD yet   No SE. Patient was administered Spravato 84 mg intranasally today.  The patient experienced the typical dissociation  which gradually resolved over the 2-hour period of observation.  There were no complications.  Specifically the patient did not have nausea or vomiting or headache.  There is a lot things going on in her life and it is somewhat difficult to separate stressors from what is going on with her mood.  However she is happy to resume Spravato as she decided after stopping briefly that it was helpful.  She plans to start the antidepressant Morrow.  She also wants to resume the stimulant tomorrow.  12/30/22 appt noted; Psych med: belsomra 20, clonazepam 1 mg HS.  Not resumed stimulant.  Effexor XR 37.5 mg daily   No SE. Patient was administered Spravato 84 mg intranasally today.  The patient experienced the typical dissociation which gradually resolved over the 2-hour period of observation.  There were no complications.  Specifically the patient did not have nausea or vomiting or headache.  She is still having depression with very prominent anhedonia.  However she feels the Spravato is somewhat helpful and is hopeful for more complete response as she gets back onto a reasonable antidepressant dose.  She tolerated Effexor XR 300 mg daily in the past and took it for 5 months and it is probably the antidepressants worked best for her.  She is agreeable with this plan.  She is getting sleep benefit from the current medications  AIMS    Flowsheet Row Video Visit from 01/03/2022 in [facility]  AIMS Total Score 0      ECT-MADRS    Flowsheet Row Clinical Support from 08/13/2022 in [facility] Video Visit from 04/29/2022 in [facility] Video Visit from 02/06/2022 in [facility]  MADRS Total Score 27 44 23      GAD-7    Flowsheet Row Counselor from 12/19/2022 in [facility] Office Visit from 08/22/2022 in [facility] [place]  Total GAD-7 Score 4 7       PHQ2-9    Flowsheet Row Counselor from 12/19/2022 in [facility] Office Visit from 08/22/2022 in [facility] [place] Office Visit from 08/19/2022 in [facility] Video Visit from 02/06/2022 in [facility] Office Visit from 11/16/2020 in [place]  [facility] [place] Total Score 2 3 0 6 0  PHQ-9 Total Score 5 5 -- 14 --        Past Psychiatric Medication Trials: Trintellix - Initially effective and then was not effective when re-started Paxil- Ineffective. Helped initially. Prozac-Insomnia Lexapro- Effective and then no longer as effective Zoloft- Initially effective and then minimal response Viibryd Cymbalta- Ineffective. Helped initially. Effexor XR 300- Effective, well tolerated. Pristiq- Increased anxiety at 150 mg daily Wellbutrin XL-Ineffective.May have increased anxiety. Amitriptyline Nortriptyline-Caused irritability, constipation Auvelity- ineffective Selegilne brief 20 mg daily , more irritable  Abilify- Helpful but caused severe insomnia. Rexulti-Helpful but caused insomnia. Vraylar Seroquel - Ineffective Risperdal Olanzapine- Ineffective Latuda- Ineffective Caplyta  Klonopin- Effective Temazepam- Took during menopause Lunesta- Ineffective Ambien-Ineffective Sonata- Ineffective Dayvigo- partially effective Failed resp Belsomra 15 and seroquel 25 Trazodone-  somewhat effective Remeron-Ineffective. Does not recall wt gain.  Quetiapine 25 NR  Adderall- Took once and had adverse effect. Concerta- Effective but only 4-6 hours Jornay 80 NR Metadate-Not as effective as Concerta Dexmethylphenidate- Not as effective as Concerta Azstarys- some side effects. Not as effective.  Ritalin short acting and SE anxiety & HTN @20  TID Cotemplay 17.3  Daytrana  Lamictal- May have had an adverse  effects. "Felt weird." Reports worsening s/s.  Lithium ? effect Gabapentin Pramipexole- 1 mg QID NR  ECT #20 with minimal response. H says it was partly helpful.  AIMS    Flowsheet Row Video Visit from 01/03/2022 in [facility]  AIMS Total Score 0      ECT-MADRS    Flowsheet Row Clinical Support from 08/13/2022 in [facility] Video Visit from 04/29/2022 in [facility] Video Visit from 02/06/2022 in [facility]  MADRS Total Score 27 44 23      GAD-7    Flowsheet Row Counselor from 12/19/2022 in [facility] Office Visit from 08/22/2022 in [facility] [place]  Total GAD-7 Score 4 7      PHQ2-9    Flowsheet Row Counselor from 12/19/2022 in [facility] Office Visit from 08/22/2022 in [facility] [place] Office Visit from 08/19/2022 in [facility] Video Visit from 02/06/2022 in [facility] Office Visit from 11/16/2020 in [facility] at [facility]  PHQ-2 Total Score 2 3 0 6 0  PHQ-9 Total Score 5 5 -- 14 --        Review of Systems:  Review of Systems  Cardiovascular:  Negative for chest pain and palpitations.  Neurological:  Negative for dizziness and weakness.  Psychiatric/Behavioral:  Positive for dysphoric mood and sleep disturbance. Negative for agitation and decreased concentration. The patient is nervous/anxious.     Medications: I have reviewed the patient's current medications.  Current Outpatient Medications  Medication Sig Dispense Refill   clonazePAM (KLONOPIN) 0.5 MG tablet 2 tablets at night and 1 tablet as needed daily for panic anxiety (Patient taking differently: Take 1 mg by mouth at bedtime.) 75 tablet 1   Esketamine HCl, 84 MG  Dose, (SPRAVATO, 84 MG DOSE,) 28 MG/DEVICE SOPK Place 84 mg into the nose every 3 (three) days. 3 each 5   Estradiol (VAGIFEM) 10 MCG TABS vaginal tablet Place 1 tablet (10 mcg total) vaginally 2 (two) times a week. 24 tablet 3  Suvorexant (BELSOMRA) 20 MG TABS TAKE 1 TABLET BY MOUTH AT BEDTIME 30 tablet 2   valACYclovir (VALTREX) 500 MG tablet Take one twice daily for 3 days with any symptoms 30 tablet 3   valsartan (DIOVAN) 80 MG tablet Take 1 tablet (80 mg total) by mouth daily. 90 tablet 3   venlafaxine XR (EFFEXOR XR) 37.5 MG 24 hr capsule 1 capsule daily for 4 days, then 2 capsules daily (Patient taking differently: Take 37.5 mg by mouth daily with breakfast. 1 capsule daily for 4 days, then 2 capsules daily) 30 capsule 0   No current facility-administered medications for this visit.    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   TRANSAMINASES, SERUM, ELEVATED 08/14/2009    Past Medical History, Surgical history, Social history, and Family history were reviewed and updated as appropriate.   Please see review of systems for further details on the patient's review from today.   Objective:   Physical Exam:  LMP 12/27/2007 (Exact Date)   Physical Exam Constitutional:      General: She is not in acute distress. Musculoskeletal:        General: No deformity.  Neurological:     Mental Status: She is alert and oriented to person, place, and time.     Coordination: Coordination normal.  Psychiatric:        Attention and Perception: Attention and perception normal. She does not perceive auditory hallucinations.        Mood and  Affect: Mood is anxious and depressed. Affect is blunt.        Speech: Speech normal. Speech is not slurred.        Behavior: Behavior normal.        Thought Content: Thought content normal. Thought content is not delusional. Thought content does not include homicidal or suicidal ideation. Thought content does not include suicidal plan.        Cognition and Memory: Cognition and memory normal.        Judgment: Judgment normal.     Comments: Insight intact Mood still mod dep  Affect blunted without change     Lab Review:     Component Value Date/Time   NA 132 (L) 10/27/2022 1107   K 4.4 10/27/2022 1107   CL 95 (L) 10/27/2022 1107   CO2 30 10/27/2022 1107   GLUCOSE 89 10/27/2022 1107   BUN 14 10/27/2022 1107   CREATININE 0.67 10/27/2022 1107   CREATININE 0.68 01/26/2020 0920   CALCIUM 10.1 10/27/2022 1107   PROT 8.3 04/22/2022 0932   ALBUMIN 5.3 (H) 04/22/2022 0932   AST 82 (H) 04/22/2022 0932   ALT 118 (H) 04/22/2022 0932   ALKPHOS 74 04/22/2022 0932   BILITOT 0.3 04/22/2022 0932   GFRNONAA >90 05/01/2014 1645   GFRAA >90 05/01/2014 1645       Component Value Date/Time   WBC 3.3 (L) 04/22/2022 0932   RBC 4.25 04/22/2022 0932   HGB 14.3 04/22/2022 0932   HGB 14.1 10/26/2014 1034   HCT 40.7 04/22/2022 0932   PLT 245.0 04/22/2022 0932  MCV 95.7 04/22/2022 0932   MCH 34.4 (H) 01/26/2020 0920   MCHC 35.1 04/22/2022 0932   RDW 13.2 04/22/2022 0932   LYMPHSABS 0.9 04/22/2022 0932   MONOABS 0.2 04/22/2022 0932   EOSABS 0.1 04/22/2022 0932   BASOSABS 0.0 04/22/2022 0932    No results found for: "POCLITH", "LITHIUM"   No results found for: "PHENYTOIN", "PHENOBARB", "VALPROATE", "CBMZ"   .res Assessment: Plan:    Rejoice was seen today for follow-up, depression, anxiety and sleeping problem.  Diagnoses and all orders for this visit:  Recurrent major depression resistant to treatment [facility])  Generalized anxiety disorder  Attention deficit hyperactivity disorder  (ADHD), predominantly inattentive type  Insomnia, unspecified type  Stressful life event affecting family   Pt seen for TRD with multiple failed medications as noted above.  Some of these failures have been related to an adequate dose and duration as she has had a tendency to get frustrated with medication trials.TRD need 6 week trial of AD at full dose at least.   She has had no problems with the transition which has been started from selegiline to Effexor XR.  Patient was administered Spravato 84 mg intranasally today.  The patient experienced the typical dissociation which gradually resolved over the 2-hour period of observation.  There were no complications.  Specifically the patient did not have nausea or vomiting or headache.  Blood pressures remained within normal ranges at the 40-minute and 2-hour follow-up intervals.  By the time the 2-hour observation period was met the patient was alert and oriented and able to exit without assistance.  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  See nursing note for further details.  Consider switch Spravato to MAOI, Parnate  We discussed the short-term risks associated with benzodiazepines including sedation and increased fall risk among others.  Discussed long-term side effect risk including dependence, potential withdrawal symptoms, and the potential eventual dose-related risk of dementia.  But recent studies from 2020 dispute this association between benzodiazepines and dementia risk. Newer studies in 2020 do not support an association with dementia.  for insomnia, clonazepam 1 mg HS.  Some better so far with Belsomra 20 mg HS  She wants to resume Cotempla or Concerta bc helps mood early in day and focus  benefit lasts longer.  Have not been able to get stimulant to be consitently effective throughout the day.  Started venlafaxine XR 37.5 mg.  She will  increase every 3 to 4 days or as tolerated to try to get to XR 300 mg daily as soon as possible.  This was a dose that she took and tolerated in the past.  No other med change today  FU weekly Spravato .  Meredith Staggers, MD, DFAPA    Please see After Visit Summary for patient specific instructions.  Future Appointments  Date Time Provider Department Center  01/05/2023  2:00 PM Gaspar Bidding [facility] None  01/29/2023 11:00 AM Gaspar Bidding, [facility] CP-CP None  02/09/2023  1:00 PM Gaspar Bidding, [facility] CP-CP None  02/16/2023  1:00 PM Gaspar Bidding, [facility] CP-CP None  02/23/2023  1:00 PM Gaspar Bidding, [facility] CP-CP None  03/02/2023  2:00 PM Gaspar Bidding, [facility] CP-CP None  09/18/2023  8:15 AM Jerene Bears, MD [facility]    No orders of the defined types were placed in this encounter.   -------------------------------

## 2022-12-31 ENCOUNTER — Ambulatory Visit: Payer: 59 | Admitting: Psychiatry

## 2022-12-31 ENCOUNTER — Other Ambulatory Visit: Payer: Self-pay

## 2022-12-31 MED ORDER — SPRAVATO (84 MG DOSE) 28 MG/DEVICE NA SOPK: 84.0000 mg | PACK | NASAL | 5 refills | Status: DC

## 2023-01-01 ENCOUNTER — Ambulatory Visit (INDEPENDENT_AMBULATORY_CARE_PROVIDER_SITE_OTHER): Payer: 59 | Admitting: Psychiatry

## 2023-01-01 ENCOUNTER — Ambulatory Visit: Payer: 59

## 2023-01-01 VITALS — BP 129/77 | HR 60

## 2023-01-01 DIAGNOSIS — F900 Attention-deficit hyperactivity disorder, predominantly inattentive type: Secondary | ICD-10-CM | POA: Diagnosis not present

## 2023-01-01 DIAGNOSIS — F339 Major depressive disorder, recurrent, unspecified: Secondary | ICD-10-CM | POA: Diagnosis not present

## 2023-01-01 DIAGNOSIS — F411 Generalized anxiety disorder: Secondary | ICD-10-CM | POA: Diagnosis not present

## 2023-01-01 DIAGNOSIS — G4700 Insomnia, unspecified: Secondary | ICD-10-CM

## 2023-01-01 NOTE — Progress Notes (Signed)
NURSES NOTE:           Patient arrived for her #47 Spravato treatment. Pt is coming twice a week currently after a change in medication for 1 month. Pt is being treated for Treatment Resistant Depression, pt will be receiving 84 mg (3 of the 28 mg) nasal sprays, this is her maintenance dose. Patient taken to treatment room, I explained and discussed her treatment and how the schedule should go, along with any side effects that may occur. Answered any questions and concerns the patient had. Pt's Spravato is delivered through [place] and she is now billing with her insurance. Spravato medication is stored at doctors office per REMS/FDA guidelines. The medication is required to be locked behind two doors per FDA/REMS Protocol. Medication is also disposed of properly per regulations. All treatments and her vital signs are documented in Spravato REMS per protocol of treatment center regulations. [facility] was notified pt is restarting, and will need her Spravato delivered again on a regular basis. No new PA is needed at this time.      Vital Signs assessed at 8:10 AM 109/69, pulse 58, pulse ox 92%. Instructed pt to blow her nose and recline back slightly to prevent any of medication dripping out of her nose.   Pt. Given 1st dose (28 mg inhaler), then 5 minutes between 2nd dose and 3rd dose for total of 84 mg. No complaints of nausea/vomiting reported. Pt did listen to music today, she just reclined back and closed her eyes. After 40 minutes I went to assess her vital signs, no side effects or symptoms reported, B/P 130/74, pulse 60. Dr. Jennelle Human talked to pt today about her treatment. Nurse was with pt a total of 60 minutes but pt observed for 120 minutes per protocol. Discharge vital signs were at 10:00 AM, B/P 129/77, pulse 60. She verbalized understanding. She reports dissociation but she was clear upon her discharge. She is scheduled on Monday, September 9th.  She is instructed to contact office if needing  anything prior to her next treatment.     LOT # [id number]   EXP OCT 2026

## 2023-01-02 ENCOUNTER — Encounter: Payer: Self-pay | Admitting: Psychiatry

## 2023-01-02 NOTE — Progress Notes (Signed)
Heather Davila [id number] Sep 18, 1960 62 y.o.  Subjective:   Patient ID:  Heather Davila is a 61 y.o. (DOB [birth year]) female.  Chief Complaint:  Chief Complaint  Patient presents with  . Follow-up  . Depression  . Stress  . Sleeping Problem  . Anxiety    HPI [name] presents to the office today for follow-up of treatment resistant recurrent depression, generalized anxiety disorder and insomnia.  Almost too numerous to count med failures. She is here for Spravato administration for her treatment resistant depression.  05/21/22 appt noted: Patient was administered first dose Spravato 56 mg intranasally today.  The patient experienced the typical dissociation which gradually resolved over the 2-hour period of observation.  There were no complications.  Specifically the patient did not have nausea or vomiting or headache.   Dissociation was mild. Did not experience any emotional relief from disabling depression.wants to increase Spravato to the usual dose.  She is aware insurance is not covering the costs at this time.  No SI acutely. Tolerating meds.    05/23/22 appt noted: Patient was administered Spravato 84 mg intranasally today.  The patient experienced the typical dissociation which gradually resolved over the 2-hour period of observation.  There were no complications.  Specifically the patient did not have nausea or vomiting.   Dissociation was greater than last dose and a little bothersome DT some HA. Tolerating meds.   Mood has not changed significantly thus far with Spravato.  05/27/22 appt noted: Patient was administered Spravato 84 mg intranasally today.  The patient experienced the typical dissociation which gradually resolved over the 2-hour period of observation.  There were no complications.  Specifically the patient did not have nausea or vomiting or headache. Today she has felt a little relief from the pressing heaviness of depression but a little more anxiety.   During administration of Spravato she listens to [place] music and was able to relax a little more with the feeling of dissociation that was mildly bothersome. Husband was also in on the session today and asked some questions about IV ketamine versus nasal spray ketamine.  05/29/22 appt noted: Cancelled today DT hypertension  05/30/22 appt noted: Patient was administered Spravato 84 mg intranasally today.  The patient experienced the typical dissociation which gradually resolved over the 2-hour period of observation.  There were no complications.  Specifically the patient did not have nausea or vomiting or headache. She is seeing some improvement in mood with less continuous depression and less intensity.  Hopefulness is better.   No med complaints or SE  06/05/22 appt noted: Patient was administered Spravato 84 mg intranasally today.  The patient experienced the typical dissociation which gradually resolved over the 2-hour period of observation.  There were no complications.  Specifically the patient did not have nausea or vomiting or headache.  Specifically HA is less of a problem with Spravato. No SE with meds. Depression is improving with less intensity.  Intermittent anxiety easily.  More able to enjoy things.  No new concerns.  06/17/22 appt noted: Patient was administered Spravato 84 mg intranasally today.  The patient experienced the typical dissociation which gradually resolved over the 2-hour period of observation.  There were no complications.  Specifically the patient did not have nausea or vomiting or headache.  Specifically HA is less of a problem with Spravato. No SE with meds. Depression is improved 45% with less intensity.  Intermittent anxiety less easily.  More able to enjoy things.  No new concerns.  More  active. Current meds: increased clonazepam to about 1.5 mg HS DT recent insomnia, lexapro 20, Dayvigo 10 mg HS, concerta 36 mg AM, trazodone 100 mg HS.  06/20/22 appt  noted: Current meds: increased clonazepam to about 1.5 mg HS DT recent insomnia, lexapro 20, Dayvigo 10 mg HS, concerta 36 mg AM, trazodone 100 mg HS. Patient was administered Spravato 84 mg intranasally today.  The patient experienced the typical dissociation which gradually resolved over the 2-hour period of observation.  There were no complications.  Specifically the patient did not have nausea or vomiting or headache.  Specifically HA is less of a problem with Spravato. No SE with meds. Depression is improved 45% with less intensity.  Intermittent anxiety less easily.  More able to enjoy things.  No new concerns.  More active.  06/23/22 appt noted:  Current meds: increased clonazepam to about 1.5 mg HS DT recent insomnia, lexapro 20, Dayvigo 10 mg HS, concerta 36 mg AM, trazodone 100 mg HS. Patient was administered Spravato 84 mg intranasally today.  The patient experienced the typical dissociation which gradually resolved over the 2-hour period of observation.  There were no complications.  Specifically the patient did not have nausea or vomiting or headache.  Specifically HA is less of a problem with Spravato. No SE with meds. Intensity of Spravato dissociation varies.  Last week very intesne and this week more typical.  Depression is continuing to improve with better interest and activity and motivation.  Gradual improvement.  Wants to continue.  06/25/22 appt noted: Current meds: increased clonazepam to about 1.5 mg HS DT recent insomnia, lexapro 20, Dayvigo 10 mg HS, concerta 36 mg AM, trazodone 100 mg HS. Patient was administered Spravato 84 mg intranasally today.  The patient experienced the typical dissociation which gradually resolved over the 2-hour period of observation.  There were no complications.  Specifically the patient did not have nausea or vomiting or headache.  Specifically HA is less of a problem with Spravato. No SE with meds. Intensity of Spravato dissociation varies.  No  scary and well tolerated. Continues to gradually improve with Spravato.  She is more motivated and enjoying things more.  H sees progress.  Wants to continue twice weekly until gets maximum response.  Dep is not gone yet.  06/30/22 appt noted:  seen with H Current meds: increased clonazepam to about 1.5 mg HS DT recent insomnia, lexapro 20, Dayvigo 10 mg HS, concerta 36 mg AM, trazodone 100 mg HS. Patient was administered Spravato 84 mg intranasally today.  The patient experienced the typical dissociation which gradually resolved over the 2-hour period of observation.  There were no complications.  Specifically the patient did not have nausea or vomiting or headache.  Specifically HA is less of a problem with Spravato. No SE with meds. Intensity of Spravato dissociation varies.  No scary and well tolerated. Continues to gradually improve with Spravato.  She is more motivated and enjoying things more.  H sees even more progress than she does; more active and smiling.  Wants to continue twice weekly until gets maximum response.  Dep is 80% better.  07/07/22 appt noted: Current meds: increased clonazepam to about 1.5 mg HS DT recent insomnia, lexapro 20, Dayvigo 10 mg HS, concerta 36 mg AM, trazodone 100 mg HS. Patient was administered Spravato 84 mg intranasally today.  The patient experienced the typical dissociation which gradually resolved over the 2-hour period of observation.  There were no complications.  Specifically the patient did not have  nausea or vomiting or headache.  Specifically HA is less of a problem with Spravato. No SE with meds. Intensity of Spravato dissociation varies.  No scary and well tolerated.  Was more intese today. Overall mood is markedly better and please with meds. No changes desired.  07/09/22 appt noted: In transition from Lexapro to sertraline and missing Concerta bc CO crash from it after about 4 hours.  Disc these concerns.  She'll discuss also with Corie Chiquito,  NP More dep the last couple of days and concerned about reducing Spravato frequency while transition from one antidep to another.  Affect less positive. Patient was administered Spravato 84 mg intranasally today.  The patient experienced the typical dissociation which gradually resolved over the 2-hour period of observation.  There were no complications.  Specifically the patient did not have nausea or vomiting or headache.  Specifically HA is less of a problem with Spravato. No SE with meds. Intensity of Spravato dissociation varies.  No scary and well tolerated.   07/14/22 appt noted: Has been more depressed this week.  More trouble with sleep.  Taking clonazepam 1-1.5  of the 0.5 mg tablets.   Trazodone not helping much. She switched back from 200 sertraline to Lexapro 20.  No SE More trouble with motivation.  Some stress with son with special needs. Misses the benevit of Concerta but it was too short acting and crashed in 4-6 hours. Patient was administered Spravato 84 mg intranasally today.  The patient experienced the typical dissociation which gradually resolved over the 2-hour period of observation.  There were no complications.  Specifically the patient did not have nausea or vomiting or headache.  Specifically HA is less of a problem with Spravato. No SE with meds. Intensity of Spravato dissociation varies.  No scary and well tolerated.   07/17/22 appt noted: Patient was administered Spravato 84 mg intranasally today.  The patient experienced the typical dissociation which gradually resolved over the 2-hour period of observation.  There were no complications.  Specifically the patient did not have nausea or vomiting or headache.  Specifically HA is less of a problem with Spravato. No SE with meds. Intensity of Spravato dissociation varies.  Not scary and well tolerated.  Just got Jornay last night but wasn't sure how to take it.  Wants to start and this was discussed.  She felt MPH helped  mood and attn but didn't last long enough and had crash after a few hours on Concerta.  Better for 2 hours after each dose Ritalin 20 mg with mood but then it wore off.  BP up after 3rd dose 160/90 and then took H's amlodipine 5 and BP became normal.  07/22/22 appt noted: Patient was administered Spravato 84 mg intranasally today.  The patient experienced the typical dissociation which gradually resolved over the 2-hour period of observation.  There were no complications.  Specifically the patient did not have nausea or vomiting or headache.  Specifically HA is less of a problem with Spravato. No SE with meds. Intensity of Spravato dissociation varies.  Not scary and well tolerated.  Start Jornay 20 mg over the weekend and did not notice any particular effect good or bad.  Increased to 40 mg. Other psych meds: Clonazepam 0.5 mg tablets 2 nightly, gabapentin dosage varies, Dayvigo 10 mg nightly, Lexapro 20 mg daily, to trazodone 100 mg nightly  07/24/22 appt noted: Patient was administered Spravato 84 mg intranasally today.  The patient experienced the typical dissociation which gradually resolved  over the 2-hour period of observation.  There were no complications.  Specifically the patient did not have nausea or vomiting or headache.  Specifically HA is less of a problem with Spravato. No SE with meds. Intensity of Spravato dissociation varies.  Not scary and well tolerated.  Other psych meds: Clonazepam 0.5 mg tablets 2 nightly, gabapentin dosage varies, Dayvigo 10 mg nightly, Lexapro 20 mg daily, to trazodone 100 mg nightly, Jornay 40 mg PM Doing well with Spravato.  Noticed a little effect from the Jornay 40 without SE but would like to increase the dose for ADD and mood.  Sleeping well.  No new concerns.  Still more depressed than she was with th initial benefit of Spravato.  07/30/22 note:  Patient was administered Spravato 84 mg intranasally today.  The patient experienced the typical  dissociation which gradually resolved over the 2-hour period of observation.  There were no complications.  Specifically the patient did not have nausea or vomiting or headache.  Specifically HA is less of a problem with Spravato. Other psych meds: Clonazepam 0.5 mg tablets 2 nightly, gabapentin dosage varies, Dayvigo 10 mg nightly, Lexapro 20 mg daily, to trazodone 100 mg nightly, Jornay 60 mg PM Wants to increase Jornay to 80 mg PM to increase benefit for ADD and depression. No SE She has experienced a major family crisis that threatens to change her daily life from now on.  It has not triggered suicidal thoughts at this time but she is very afraid.  No desire to change medications.  08/04/22 appt noted" Patient was administered Spravato 84 mg intranasally today.  The patient experienced the typical dissociation which gradually resolved over the 2-hour period of observation.  There were no complications.  Specifically the patient did not have nausea or vomiting or headache.  Specifically HA is less of a problem with Spravato. Other psych meds: Clonazepam 0.5 mg tablets 2 nightly, gabapentin dosage varies, Dayvigo 10 mg nightly, Lexapro 20 mg daily, trazodone 100 mg nightly, Jornay 80 mg PM No SE No benefit or SE with increase Jormay 80 mg.  Says Concerta helped ADD and depression but not Jornay.  However duration was insufficient.  Still depressed and wants change to another form of stimulant.  No concerns with other meds. Continues with strong family stressors creating uncertainty about her future but no quick resolution. No SI Trial Cotempla 17.3 and DC Ritalin & Jornay 80  08/11/2022 appointment noted: Patient was administered Spravato 84 mg intranasally today.  The patient experienced the typical dissociation which gradually resolved over the 2-hour period of observation.  There were no complications.  Specifically the patient did not have nausea or vomiting or headache.  Specifically HA is less  of a problem with Spravato now vs when started it.. Other psych meds: Clonazepam 0.5 mg tablets 2 nightly, gabapentin dosage varies, Dayvigo 10 mg nightly, trazodone 100 mg nightly, cotempla 17.3, switch from Lexapro 20 to sertaline 15o mg . No noticeable effects sertraline from for depression but has seen some antianxiety effects from the sertraline.  No side effects noted.  No side effects with the other medicines.  Still is only getting very brief benefit 3 to 4 hours from Cotempla for ADD and mood.  She wants to try the Daytrana patch as we have discussed before. No SE  08/13/22 appt: Patient was administered Spravato 84 mg intranasally today.  The patient experienced the typical dissociation which gradually resolved over the 2-hour period of observation.  There were no  complications.  Specifically the patient did not have nausea or vomiting or headache.  Specifically HA is less of a problem with Spravato now vs when started it.. Other psych meds: Clonazepam 0.5 mg tablets 2 nightly, gabapentin dosage varies, Dayvigo 10 mg nightly, trazodone 100 mg nightly, cotempla 17.3, switch from Lexapro 20 to sertaline 15o mg . No noticeable effects sertraline from for depression but has seen some antianxiety effects from the sertraline.  No side effects noted.  No side effects with the other medicines.  Still is only getting very brief benefit 3 to 4 hours from Cotempla for ADD and mood.  She wants to try the Daytrana patch as we have discussed before.  Hasn't been able to get it yet. No SE Plan.  Continue to try to get Daytrana 30 AM  08/18/22 appt noted: Patient was administered Spravato 84 mg intranasally today.  The patient experienced the typical dissociation which gradually resolved over the 2-hour period of observation.  There were no complications.  Specifically the patient did not have nausea or vomiting or headache.  Specifically HA is less of a problem with Spravato now vs when started it.. Other  psych meds: Clonazepam 0.5 mg tablets 2 nightly, gabapentin dosage varies, Dayvigo 10 mg nightly, trazodone 100 mg nightly, cotempla 17.3, switch from Lexapro 20 to sertaline 15o mg . No noticeable effects sertraline from for depression but has seen some antianxiety effects from the sertraline.  No side effects noted.  No side effects with the other medicines.  Still is only getting very brief benefit 3 to 4 hours from Cotempla for ADD and mood.  She wants to try the Daytrana patch as we have discussed before.  Hasn't been able to get it yet.  Overall depression is some better than it was.   No SE Plan.  Continue to try to get Daytrana 30 AM  08/25/22 appt : No benefit Daytrana.  No SE.  Wants further med changes and interested In retrying pramipexole.   Tolerating meds. Patient was administered Spravato 84 mg intranasally today.  The patient experienced the typical dissociation which gradually resolved over the 2-hour period of observation.  There were no complications.  Specifically the patient did not have nausea or vomiting or headache.  Specifically HA is less of a problem with Spravato now vs when started it.. Other psych meds: Clonazepam 0.5 mg tablets 2 nightly, gabapentin dosage varies, Dayvigo 10 mg nightly, trazodone 100 mg nightly, , switch from Lexapro 20 to sertaline 15o mg . Daytrana 30 AM for a few days. Struggles with depression and no SI.  Reduced interest and mood and motivation and enjoyment and socialization.  08/27/22 appt noted: Psych meds:  Clonazepam 0.5 mg tablets 2 nightly, gabapentin dosage varies, Dayvigo 10 mg nightly, trazodone 100 mg nightly,  sertaline 15o mg . Returned to [employer] AM, pramipexole  Tolerating meds. Patient was administered Spravato 84 mg intranasally today.  The patient experienced the typical dissociation which gradually resolved over the 2-hour period of observation.  There were no complications.  Specifically the patient did not have nausea or vomiting  or headache.   Reports taking cotempla bc brief mood benefit and sig focus and productivity benefit. Took 1 dose pramipexole and felt more dep.  09/01/22 appt noted: Psych meds:  Clonazepam 0.5 mg tablets 2 nightly, gabapentin dosage varies, Dayvigo 10 mg nightly, trazodone 100 mg nightly,  sertaline 15o mg . Returned to Cotempla AM, pramipexole  0.25 mg BID. Tolerating meds now. Patient  was administered Spravato 84 mg intranasally today.  The patient experienced the typical dissociation which gradually resolved over the 2-hour period of observation.  There were no complications.  Specifically the patient did not have nausea or vomiting or headache.   Willing to try the pramipexole and will continue 0.25 mg BID this week and then consider increase if tolerated.  Mood is a little better.  Still looking for further improvement.  09/03/22 appt noted: Psych meds:  Clonazepam 0.5 mg tablets 2 nightly, gabapentin dosage varies, Dayvigo 10 mg nightly, trazodone 100 mg nightly,  sertaline 15o mg . Returned to Cotempla AM, pramipexole  0.25 mg tablet 1 and 1/2 tablets twice daily BID. Tolerating meds now. Patient was administered Spravato 84 mg intranasally today.  The patient experienced the typical dissociation which gradually resolved over the 2-hour period of observation.  There were no complications.  Specifically the patient did not have nausea or vomiting or headache.   Depression may be a little better with the parmipexole and is tolerating it well so far.  Still struggling with focus and residual depression.  Tolerating meds without SE.  More hopeful and able to enjoy some things.  09/08/22 appt  Psych meds:  Clonazepam 0.5 mg tablets 2 nightly, gabapentin dosage varies, Dayvigo 10 mg nightly, trazodone 100 mg nightly,  sertaline 15o mg . Returned to Cotempla AM, pramipexole  0.5 mg BID. Tolerating meds now. Patient was administered Spravato 84 mg intranasally today.  The patient experienced the  typical dissociation which gradually resolved over the 2-hour period of observation.  There were no complications.  Specifically the patient did not have nausea or vomiting or headache.   She has seen her depression drop from a 10/10 prior to treatment with Spravato to now 4/10 With additional benefit noted when she increased the pramipexole to 0.5 mg twice daily.  She is not having side effects with this like she did in the past. Anxiety levels are also improved. She is sleeping okay with the current medications. Plan: continue pramipexole trial off-label and increase more slowly for TRD.  Increase  Gradually  to 0.75 mg BID  09/11/22 appt noted: Psych meds:  Clonazepam 0.5 mg tablets 2 nightly, gabapentin dosage varies, Dayvigo 10 mg nightly, trazodone 100 mg nightly,  sertaline 15o mg . Returned to Cotempla AM, pramipexole  0.75 mg BID. Tolerating meds now. Patient was administered Spravato 84 mg intranasally today.  The patient experienced the typical dissociation which gradually resolved over the 2-hour period of observation.  There were no complications.  Specifically the patient did not have nausea or vomiting or headache.   She has seen her depression drop from a 10/10 prior to treatment with Spravato to now 4/10 With additional benefit noted when she increased the pramipexole to 0.5 mg twice daily.  She is not having side effects with this like she did in the past. Clearly improving with the pramipexole now and tolerating it.  Satisfied with current meds but would consider increasing if it might be helpful.  09/15/22 appt noted: Psych meds:  Clonazepam 0.5 mg tablets 2 nightly, gabapentin dosage varies, Dayvigo 10 mg nightly, trazodone 100 mg nightly,  sertaline 15o mg . Returned to Cotempla AM, pramipexole  0.75 mg BID too anxious and reduced to 0.5 mg BID. Tolerating meds now. Patient was administered Spravato 84 mg intranasally today.  The patient experienced the typical dissociation  which gradually resolved over the 2-hour period of observation.  There were no complications.  Specifically  the patient did not have nausea or vomiting or headache.   Trouble staying asleep longer than 6 hours.  Doesn't feel she can tolerate pramipexole 0.75 mg BID DT anxiety not experienced at  0.5 mg BID.  Otherwise tolerating meds.  Mood is better with Spravato and pramipexole.   Not sleeping as long with meds.  EMA 6 hours.  Wonders about change Disc concerns about waiting for therapy. Feels ready to reduce Spravato to weekly.    09/25/22 appt noted: Psych meds:  Clonazepam 0.5 mg tablets 2 nightly, gabapentin dosage varies, Dayvigo 10 mg nightly, trazodone 100 mg nightly,  sertaline 15o mg . Returned to Cotempla AM, pramipexole  0.75 mg BID too anxious and reduced to 0.5 mg BID. Tolerating meds now. Patient was administered Spravato 84 mg intranasally today.  The patient experienced the typical dissociation which gradually resolved over the 2-hour period of observation.  There were no complications.  Specifically the patient did not have nausea or vomiting or headache.   Trouble staying asleep longer than 6 hours.  Failed to respond to Seroquel 25 mg HS.  Gone back to [facility].  Belsomra NR. Mood ok until the last few days more dep bc it's been 10 days since last admin.  Wants to continue weekly.   No SE.  No med changes desired.  09/29/22 appt noted: Psych meds:  Clonazepam 0.5 mg tablets 2 nightly, gabapentin dosage varies, Dayvigo 10 mg nightly, trazodone 100 mg nightly,  sertaline 15o mg . Returned to Cotempla AM, pramipexole  0.75 mg BID too anxious and reduced to 0.5 mg BID. Tolerating meds now. Patient was administered Spravato 84 mg intranasally today.  The patient experienced the typical dissociation which gradually resolved over the 2-hour period of observation.  There were no complications.  Specifically the patient did not have nausea or vomiting or headache.   Trouble staying asleep  longer than 6 hours.  Plan: Continue sertaline to 200 mg daily.  It has helped anxiety but not depression.   for insomnia, clonazepam 1 mg HS. trazodone doesn't work well but helps some Continue Dayvigo 10 HS.  Failed resp Belsomra 15 and seroquel 25 She wants to continue Cotempla or Concerta bc helps mood early in day and focus  benefit lasts longer.  Have not been able to get stimulant to be consitently effective throughout the day. continue pramipexole trial off-label and reduce back to  TRD 0.5 mg BID Spravato weekly.  10/13/22 appt noted: Not sleeping as well.  Wants to increase Spravato to twice weekly bc feeling more dep close to the next sched date.  Just the day or 2 before.  .  Plan: Trial for off label TRD increase pramipexole 1 mg TID  10/20/22 appt noted: Psych meds:  Clonazepam 0.5 mg tablets 2 nightly, gabapentin dosage varies, Dayvigo 10 mg nightly, trazodone 100 mg nightly,  sertaline 15o mg . Returned to Cotempla AM, pramipexole  1 mg TID. Tolerating meds now. Patient was administered Spravato 84 mg intranasally today.  The patient experienced the typical dissociation which gradually resolved over the 2-hour period of observation.  There were no complications.  Specifically the patient did not have nausea or vomiting or headache.   Trouble staying asleep longer than 6 hours.  More dep this week and asked to get Spravato twice this week. Plan: Trial for off label TRD increase pramipexole 1 mg QID  10/22/22 appt noted:  Psych meds:  Clonazepam 0.5 mg tablets 2 nightly and another one with  EMA, gabapentin dosage varies, Belsomra 20 mg daily,  trazodone 100 mg nightly,  sertaline 15o mg . Returned to Cotempla AM, pramipexole  1 mg QID for 2nd day Tolerating meds now.  Except a little N and dizzy briefly after takes it. Patient was administered Spravato 84 mg intranasally today.  The patient experienced the typical dissociation which gradually resolved over the 2-hour period of  observation.  There were no complications.  Specifically the patient did not have nausea or vomiting or headache. No change in mood yet.   Sleep better with Belsomra 20 as long as takes with other meds.  If insomnia mood is worse. Ongoing some dep but gets partial relief with Spravato but would like better relief .  10/27/22 received Spravato 84  11/03/22 appt noted:  Meds as above except sertraline she cut to 100 mg instead of 200 mg daily She doesn't think sertraline helping and worries over poss SE Off and on Cotempla .  Not affecting BP. Doesn't notice benefit or SE with pramipexole 1 mg QID.  No SE Sleep is OK.  Some awakening aftter 6 hours but not enough so takes another clonazepam.   Patient was administered Spravato 84 mg intranasally today.  The patient experienced the typical dissociation which gradually resolved over the 2-hour period of observation.  There were no complications.  Specifically the patient did not have nausea or vomiting or headache. No change in mood yet.   Sleep better with Belsomra 20 as long as takes with other meds.  If insomnia mood is worse. Ongoing some dep but gets partial relief with Spravato but would like better relief .  No sig benefit with pramipexole 1 mg QID.  11/21/22 TC:  To ER with dep 11/21/22. No SE with selegiline but feels more dep.  Wants to stop it. Today is only day 2 of 1 tablet of selegiline 5 mg AM.  Reviewed with her that she had not been satisfied with the response of Spravato and Zoloft and that she is likely feeling worse because the Zoloft is wearing off and in particular because she abruptly stopped 150 mg daily.  She needs to give the selegiline time to work.  She agrees to do so.  She admitted to some passive suicidal thoughts but no active suicidal thoughts and no desire to die.  She agrees to the plan to increase selegiline to 10 mg every morning this weekend and to continue the selegiline trial.  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  She is aware.  Meredith Staggers, MD, DFAPA  12/19/22 TC: Pt called at 1:42p.  She said she wanted to know if she could start weaning off the Selegiline.  She thinks increasing the dosage is making things worse.  She said she wants to go back to [employer].  She acknowleged she has an appt on Monday. I advised her that I wasn't sure Dr Jennelle Human will see her message before her appt, but she still ask me to send the message.  Next appt 8/26 MD resp.  No change until Monday appt.  12/22/22 appt noted: Meds: selegiline up to 5 mg 4 daily for a week but felt it incr irritability and went back to 3 mg daily. She wants to go back to Spravato.  Not near as dep as now.  Selegiline didn't help energy. Still on Belsomra Thinks maybe sertraline helped mood more than she thought. More dep than anxiety.  Plan: DC selegiline and will retry Effexor which helped in the past at 300 mg daily.  Start this Friday after over 10 half lives of selegiline.    12/24/22 appt noted: Stopped selegiline  Psych med: belsomra 20, clonazepam 1 mg HS.   She has felt more dep off the Spravato though at the time didn't think it was helping.  Has felt more down and withdrawn.  Low energy, motivation, not smiling.  No SI.  Sleep ok.   Patient was administered Spravato 84 mg intranasally today.  The patient experienced the typical dissociation which gradually resolved over the 2-hour period of observation.  There were no complications.  Specifically the patient did not have nausea or vomiting or headache. No problems with BP or other unusual SE Mood is a little better after this administration of Spravato.  Very motivated to continue it.  No immediate med concerns. No SI .  Ongoing anhedonia.  12/25/22 appt noted: Psych med: belsomra 20, clonazepam 1 mg HS.  Not resumed stimulant or AD yet   No SE. Patient was administered Spravato 84 mg intranasally today.  The patient experienced  the typical dissociation which gradually resolved over the 2-hour period of observation.  There were no complications.  Specifically the patient did not have nausea or vomiting or headache.  There is a lot things going on in her life and it is somewhat difficult to separate stressors from what is going on with her mood.  However she is happy to resume Spravato as she decided after stopping briefly that it was helpful.  She plans to start the antidepressant Morrow.  She also wants to resume the stimulant tomorrow.  12/30/22 appt noted; Psych med: belsomra 20, clonazepam 1 mg HS.  Not resumed stimulant.  Effexor XR 37.5 mg daily   No SE. Patient was administered Spravato 84 mg intranasally today.  The patient experienced the typical dissociation which gradually resolved over the 2-hour period of observation.  There were no complications.  Specifically the patient did not have nausea or vomiting or headache.  She is still having depression with very prominent anhedonia.  However she feels the Spravato is somewhat helpful and is hopeful for more complete response as she gets back onto a reasonable antidepressant dose.  She tolerated Effexor XR 300 mg daily in the past and took it for 5 months and it is probably the antidepressants worked best for her.  She is agreeable with this plan.  She is getting sleep benefit from the current medications Plan: incr Effexor 75 mg daily  01/02/23 appt noted: Psych meds as noted above except increase Effexor XR to 75 mg daily without side effects. Patient was administered Spravato 84 mg intranasally today.  The patient experienced the typical dissociation which gradually resolved over the 2-hour period of observation.  There were no complications.  Specifically the patient did not have nausea or vomiting or headache.  Agrees to increasing Effexor as planned.  SX not changed from that noted above.  No SI    AIMS    Flowsheet Row Video Visit from 01/03/2022 in [facility]  [facility]  AIMS Total Score 0      ECT-MADRS    Flowsheet Row Clinical Support from 08/13/2022 in [facility] Video Visit from 04/29/2022 in [facility] Video Visit from 02/06/2022 in [facility]  MADRS Total Score 27 44 23      GAD-7  Flowsheet Row Counselor from 12/19/2022 in [facility] Office Visit from 08/22/2022 in [facility] [place]  Total GAD-7 Score 4 7      PHQ2-9    Flowsheet Row Counselor from 12/19/2022 in [facility] Office Visit from 08/22/2022 in [facility] [place] Office Visit from 08/19/2022 in [facility] Video Visit from 02/06/2022 in [facility] Office Visit from 11/16/2020 in [facility] at [facility]  PHQ-2 Total Score 2 3 0 6 0  PHQ-9 Total Score 5 5 -- 14 --        Past Psychiatric Medication Trials: Trintellix - Initially effective and then was not effective when re-started Paxil- Ineffective. Helped initially. Prozac-Insomnia Lexapro- Effective and then no longer as effective Zoloft- Initially effective and then minimal response Viibryd Cymbalta- Ineffective. Helped initially. Effexor XR 300- Effective, well tolerated. Pristiq- Increased anxiety at 150 mg daily Wellbutrin XL-Ineffective.May have increased anxiety. Amitriptyline Nortriptyline-Caused irritability, constipation Auvelity- ineffective Selegilne brief 20 mg daily , more irritable  Abilify- Helpful but caused severe insomnia. Rexulti-Helpful but caused insomnia. Vraylar Seroquel - Ineffective Risperdal Olanzapine- Ineffective Latuda- Ineffective Caplyta  Klonopin- Effective Temazepam- Took during menopause Lunesta- Ineffective Ambien-Ineffective Sonata-  Ineffective Dayvigo- partially effective Failed resp Belsomra 15 and seroquel 25 Trazodone-  somewhat effective Remeron-Ineffective. Does not recall wt gain.  Quetiapine 25 NR  Adderall- Took once and had adverse effect. Concerta- Effective but only 4-6 hours Jornay 80 NR Metadate-Not as effective as Concerta Dexmethylphenidate- Not as effective as Concerta Azstarys- some side effects. Not as effective.  Ritalin short acting and SE anxiety & HTN @20  TID Cotemplay 17.3  Daytrana  Lamictal- May have had an adverse effects. "Felt weird." Reports worsening s/s.  Lithium ? effect Gabapentin Pramipexole- 1 mg QID NR  ECT #20 with minimal response. H says it was partly helpful.  AIMS    Flowsheet Row Video Visit from 01/03/2022 in [facility]  AIMS Total Score 0      ECT-MADRS    Flowsheet Row Clinical Support from 08/13/2022 in [facility] Video Visit from 04/29/2022 in [facility] Video Visit from 02/06/2022 in [facility]  MADRS Total Score 27 44 23      GAD-7    Flowsheet Row Counselor from 12/19/2022 in [facility] Office Visit from 08/22/2022 in [facility] [place]  Total GAD-7 Score 4 7      PHQ2-9    Flowsheet Row Counselor from 12/19/2022 in [facility] Office Visit from 08/22/2022 in [facility] [place] Office Visit from 08/19/2022 in [facility] at [facility] Video Visit from 02/06/2022 in [facility] Office Visit from 11/16/2020 in [facility] at [facility]  PHQ-2 Total Score 2 3 0 6 0  PHQ-9 Total Score 5 5 -- 14 --        Review of Systems:  Review of Systems  Cardiovascular:  Negative for palpitations.  Neurological:  Negative for dizziness  and weakness.  Psychiatric/Behavioral:  Positive for dysphoric mood and sleep disturbance. Negative for agitation and decreased concentration. The patient is nervous/anxious.     Medications: I have reviewed the patient's current medications.  Current Outpatient Medications  Medication Sig Dispense  Refill  . clonazePAM (KLONOPIN) 0.5 MG tablet 2 tablets at night and 1 tablet as needed daily for panic anxiety (Patient taking differently: Take 1 mg by mouth at bedtime.) 75 tablet 1  . Esketamine HCl, 84 MG Dose, (SPRAVATO, 84 MG DOSE,) 28 MG/DEVICE SOPK Place 84 mg into the nose every 3 (three) days. 3 each 5  . Estradiol (VAGIFEM) 10 MCG TABS vaginal tablet Place 1 tablet (10 mcg total) vaginally 2 (two) times a week. 24 tablet 3  . Suvorexant (BELSOMRA) 20 MG TABS TAKE 1 TABLET BY MOUTH AT BEDTIME 30 tablet 2  . valACYclovir (VALTREX) 500 MG tablet Take one twice daily for 3 days with any symptoms 30 tablet 3  . valsartan (DIOVAN) 80 MG tablet Take 1 tablet (80 mg total) by mouth daily. 90 tablet 3  . venlafaxine XR (EFFEXOR XR) 37.5 MG 24 hr capsule 1 capsule daily for 4 days, then 2 capsules daily (Patient taking differently: Take 75 mg by mouth daily with breakfast. 1 capsule daily for 4 days, then 2 capsules daily) 30 capsule 0   No current facility-administered medications for this visit.    Medication Side Effects: None  Allergies:  Allergies  Allergen Reactions  . Ciprofloxacin Hives  . Oxycodone-Acetaminophen Hives    Past Medical History:  Diagnosis Date  . Adult acne   . Anemia   . Anorexia nervosa teen  . DEPRESSION 08/09/2009  . Hematoma 09/2020   post op after face lift  . Insomnia   . 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  . TRANSAMINASES, SERUM, ELEVATED 08/14/2009    Past Medical History, Surgical history, Social history, and Family history were reviewed and updated as appropriate.   Please see review of systems  for further details on the patient's review from today.   Objective:   Physical Exam:  LMP 12/27/2007 (Exact Date)   Physical Exam Constitutional:      General: She is not in acute distress. Musculoskeletal:        General: No deformity.  Neurological:     Mental Status: She is alert and oriented to person, place, and time.     Coordination: Coordination normal.  Psychiatric:        Attention and Perception: Attention and perception normal. She does not perceive auditory hallucinations.        Mood and Affect: Mood is anxious and depressed. Affect is blunt.        Speech: Speech normal.        Behavior: Behavior normal.        Thought Content: Thought content normal. Thought content is not delusional. Thought content does not include homicidal or suicidal ideation. Thought content does not include suicidal plan.        Cognition and Memory: Cognition and memory normal.        Judgment: Judgment normal.     Comments: Insight intact Mood still mod dep  Affect blunted without change    Lab Review:     Component Value Date/Time   NA 132 (L) 10/27/2022 1107   K 4.4 10/27/2022 1107   CL 95 (L) 10/27/2022 1107   CO2 30 10/27/2022 1107   GLUCOSE 89 10/27/2022 1107   BUN 14 10/27/2022 1107   CREATININE 0.67 10/27/2022 1107   CREATININE 0.68 01/26/2020 0920   CALCIUM 10.1 10/27/2022 1107   PROT 8.3 04/22/2022 0932   ALBUMIN 5.3 (H) 04/22/2022 0932   AST  82 (H) 04/22/2022 0932   ALT 118 (H) 04/22/2022 0932   ALKPHOS 74 04/22/2022 0932   BILITOT 0.3 04/22/2022 0932   GFRNONAA >90 05/01/2014 1645   GFRAA >90 05/01/2014 1645       Component Value Date/Time   WBC 3.3 (L) 04/22/2022 0932   RBC 4.25 04/22/2022 0932   HGB 14.3 04/22/2022 0932   HGB 14.1 10/26/2014 1034   HCT 40.7 04/22/2022 0932   PLT 245.0 04/22/2022 0932   MCV 95.7 04/22/2022 0932   MCH 34.4 (H) 01/26/2020 0920   MCHC 35.1 04/22/2022 0932   RDW 13.2 04/22/2022 0932   LYMPHSABS 0.9 04/22/2022 0932    MONOABS 0.2 04/22/2022 0932   EOSABS 0.1 04/22/2022 0932   BASOSABS 0.0 04/22/2022 0932    No results found for: "POCLITH", "LITHIUM"   No results found for: "PHENYTOIN", "PHENOBARB", "VALPROATE", "CBMZ"   .res Assessment: Plan:    Shirlene was seen today for follow-up, depression, stress, sleeping problem and anxiety.  Diagnoses and all orders for this visit:  Recurrent major depression resistant to treatment (HCC)  Generalized anxiety disorder  Attention deficit hyperactivity disorder (ADHD), predominantly inattentive type  Insomnia, unspecified type    Pt seen for TRD with multiple failed medications as noted above.  Some of these failures have been related to an adequate dose and duration as she has had a tendency to get frustrated with medication trials.TRD need 6 week trial of AD at full dose at least.   She has had no problems with the transition which has been started from selegiline to Effexor XR.  No SE thus far.  Patient was administered Spravato 84 mg intranasally today.  The patient experienced the typical dissociation which gradually resolved over the 2-hour period of observation.  There were no complications.  Specifically the patient did not have nausea or vomiting or headache.  Blood pressures remained within normal ranges at the 40-minute and 2-hour follow-up intervals.  By the time the 2-hour observation period was met the patient was alert and oriented and able to exit without assistance.  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  See nursing note for further details.  Consider switch Spravato to MAOI, Parnate  We discussed the short-term risks associated with benzodiazepines including sedation and increased fall risk among others.  Discussed long-term side effect risk including dependence, potential withdrawal symptoms, and the potential eventual dose-related risk of  dementia.  But recent studies from 2020 dispute this association between benzodiazepines and dementia risk. Newer studies in 2020 do not support an association with dementia.  for insomnia, clonazepam 1 mg HS.  Some better so far with Belsomra 20 mg HS  She wants to resume Cotempla or Concerta bc helps mood early in day and focus  benefit lasts longer.  Have not been able to get stimulant to be consitently effective throughout the day.  Started venlafaxine XR 37.5 mg.  She will increase every 3 to 4 days or as tolerated to try to get to XR 300 mg daily as soon as possible.  This was a dose that she took and tolerated in the past.  No other med change today  FU weekly Spravato .  Meredith Staggers, MD, DFAPA    Please see After Visit Summary for patient specific instructions.  Future Appointments  Date Time Provider Department Center  01/05/2023  2:00 PM Gaspar Bidding, [facility] None  01/29/2023 11:00 AM  Gaspar Bidding, [facility] None  02/09/2023  1:00 PM Gaspar Bidding, [facility] CP[facility]CP None  02/16/2023  1:00 PM Gaspar Bidding, [facility] CP[facility]CP None  02/23/2023  1:00 PM Gaspar Bidding, [facility] CP[facility]CP None  03/02/2023  2:00 PM Gaspar Bidding, [facility] CP-CP None  09/18/2023  8:15 AM Jerene Bears, MD [facility]    No orders of the defined types were placed in this encounter.   -------------------------------

## 2023-01-05 ENCOUNTER — Ambulatory Visit (INDEPENDENT_AMBULATORY_CARE_PROVIDER_SITE_OTHER): Payer: 59 | Admitting: Professional Counselor

## 2023-01-05 ENCOUNTER — Ambulatory Visit (INDEPENDENT_AMBULATORY_CARE_PROVIDER_SITE_OTHER): Payer: 59 | Admitting: Psychiatry

## 2023-01-05 ENCOUNTER — Encounter: Payer: Self-pay | Admitting: Professional Counselor

## 2023-01-05 ENCOUNTER — Ambulatory Visit: Payer: 59

## 2023-01-05 VITALS — BP 127/82 | HR 69

## 2023-01-05 DIAGNOSIS — Z6379 Other stressful life events affecting family and household: Secondary | ICD-10-CM

## 2023-01-05 DIAGNOSIS — G4700 Insomnia, unspecified: Secondary | ICD-10-CM | POA: Diagnosis not present

## 2023-01-05 DIAGNOSIS — F411 Generalized anxiety disorder: Secondary | ICD-10-CM

## 2023-01-05 DIAGNOSIS — F339 Major depressive disorder, recurrent, unspecified: Secondary | ICD-10-CM

## 2023-01-05 DIAGNOSIS — F900 Attention-deficit hyperactivity disorder, predominantly inattentive type: Secondary | ICD-10-CM | POA: Diagnosis not present
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
## 2023-01-06 ENCOUNTER — Encounter: Payer: Self-pay | Admitting: Psychiatry

## 2023-01-06 NOTE — Progress Notes (Signed)
Heather Davila [id number] 09/14/1960 62 y.o.  Subjective:   Patient ID:  Heather Davila is a 62 y.o. (DOB 09/04/1960) female.  Chief Complaint:  Chief Complaint  Patient presents with   Follow-up   Depression   ADD   Anxiety   Stress   Sleeping Problem    HPI Heather Davila presents to the office today for follow-up of treatment resistant recurrent depression, generalized anxiety disorder and insomnia.  Almost too numerous to count med failures. She is here for Spravato administration for her treatment resistant depression.  05/21/22 appt noted: Patient was administered first dose Spravato 56 mg intranasally today.  The patient experienced the typical dissociation which gradually resolved over the 2-hour period of observation.  There were no complications.  Specifically the patient did not have nausea or vomiting or headache.   Dissociation was mild. Did not experience any emotional relief from disabling depression.wants to increase Spravato to the usual dose.  She is aware insurance is not covering the costs at this time.  No SI acutely. Tolerating meds.    05/23/22 appt noted: Patient was administered Spravato 84 mg intranasally today.  The patient experienced the typical dissociation which gradually resolved over the 2-hour period of observation.  There were no complications.  Specifically the patient did not have nausea or vomiting.   Dissociation was greater than last dose and a little bothersome DT some HA. Tolerating meds.   Mood has not changed significantly thus far with Spravato.  05/27/22 appt noted: Patient was administered Spravato 84 mg intranasally today.  The patient experienced the typical dissociation which gradually resolved over the 2-hour period of observation.  There were no complications.  Specifically the patient did not have nausea or vomiting or headache. Today she has felt a little relief from the pressing heaviness of depression but a little more anxiety.   During administration of Spravato she listens to [place] music and was able to relax a little more with the feeling of dissociation that was mildly bothersome. Husband was also in on the session today and asked some questions about IV ketamine versus nasal spray ketamine.  05/29/22 appt noted: Cancelled today DT hypertension  05/30/22 appt noted: Patient was administered Spravato 84 mg intranasally today.  The patient experienced the typical dissociation which gradually resolved over the 2-hour period of observation.  There were no complications.  Specifically the patient did not have nausea or vomiting or headache. She is seeing some improvement in mood with less continuous depression and less intensity.  Hopefulness is better.   No med complaints or SE  06/05/22 appt noted: Patient was administered Spravato 84 mg intranasally today.  The patient experienced the typical dissociation which gradually resolved over the 2-hour period of observation.  There were no complications.  Specifically the patient did not have nausea or vomiting or headache.  Specifically HA is less of a problem with Spravato. No SE with meds. Depression is improving with less intensity.  Intermittent anxiety easily.  More able to enjoy things.  No new concerns.  06/17/22 appt noted: Patient was administered Spravato 84 mg intranasally today.  The patient experienced the typical dissociation which gradually resolved over the 2-hour period of observation.  There were no complications.  Specifically the patient did not have nausea or vomiting or headache.  Specifically HA is less of a problem with Spravato. No SE with meds. Depression is improved 45% with less intensity.  Intermittent anxiety less easily.  More able to enjoy things.  No new  concerns.  More active. Current meds: increased clonazepam to about 1.5 mg HS DT recent insomnia, lexapro 20, Dayvigo 10 mg HS, concerta 36 mg AM, trazodone 100 mg HS.  06/20/22 appt  noted: Current meds: increased clonazepam to about 1.5 mg HS DT recent insomnia, lexapro 20, Dayvigo 10 mg HS, concerta 36 mg AM, trazodone 100 mg HS. Patient was administered Spravato 84 mg intranasally today.  The patient experienced the typical dissociation which gradually resolved over the 2-hour period of observation.  There were no complications.  Specifically the patient did not have nausea or vomiting or headache.  Specifically HA is less of a problem with Spravato. No SE with meds. Depression is improved 45% with less intensity.  Intermittent anxiety less easily.  More able to enjoy things.  No new concerns.  More active.  06/23/22 appt noted:  Current meds: increased clonazepam to about 1.5 mg HS DT recent insomnia, lexapro 20, Dayvigo 10 mg HS, concerta 36 mg AM, trazodone 100 mg HS. Patient was administered Spravato 84 mg intranasally today.  The patient experienced the typical dissociation which gradually resolved over the 2-hour period of observation.  There were no complications.  Specifically the patient did not have nausea or vomiting or headache.  Specifically HA is less of a problem with Spravato. No SE with meds. Intensity of Spravato dissociation varies.  Last week very intesne and this week more typical.  Depression is continuing to improve with better interest and activity and motivation.  Gradual improvement.  Wants to continue.  06/25/22 appt noted: Current meds: increased clonazepam to about 1.5 mg HS DT recent insomnia, lexapro 20, Dayvigo 10 mg HS, concerta 36 mg AM, trazodone 100 mg HS. Patient was administered Spravato 84 mg intranasally today.  The patient experienced the typical dissociation which gradually resolved over the 2-hour period of observation.  There were no complications.  Specifically the patient did not have nausea or vomiting or headache.  Specifically HA is less of a problem with Spravato. No SE with meds. Intensity of Spravato dissociation varies.  No  scary and well tolerated. Continues to gradually improve with Spravato.  She is more motivated and enjoying things more.  H sees progress.  Wants to continue twice weekly until gets maximum response.  Dep is not gone yet.  06/30/22 appt noted:  seen with H Current meds: increased clonazepam to about 1.5 mg HS DT recent insomnia, lexapro 20, Dayvigo 10 mg HS, concerta 36 mg AM, trazodone 100 mg HS. Patient was administered Spravato 84 mg intranasally today.  The patient experienced the typical dissociation which gradually resolved over the 2-hour period of observation.  There were no complications.  Specifically the patient did not have nausea or vomiting or headache.  Specifically HA is less of a problem with Spravato. No SE with meds. Intensity of Spravato dissociation varies.  No scary and well tolerated. Continues to gradually improve with Spravato.  She is more motivated and enjoying things more.  H sees even more progress than she does; more active and smiling.  Wants to continue twice weekly until gets maximum response.  Dep is 80% better.  07/07/22 appt noted: Current meds: increased clonazepam to about 1.5 mg HS DT recent insomnia, lexapro 20, Dayvigo 10 mg HS, concerta 36 mg AM, trazodone 100 mg HS. Patient was administered Spravato 84 mg intranasally today.  The patient experienced the typical dissociation which gradually resolved over the 2-hour period of observation.  There were no complications.  Specifically the patient  did not have nausea or vomiting or headache.  Specifically HA is less of a problem with Spravato. No SE with meds. Intensity of Spravato dissociation varies.  No scary and well tolerated.  Was more intese today. Overall mood is markedly better and please with meds. No changes desired.  07/09/22 appt noted: In transition from Lexapro to sertraline and missing Concerta bc CO crash from it after about 4 hours.  Disc these concerns.  She'll discuss also with Corie Chiquito,  NP More dep the last couple of days and concerned about reducing Spravato frequency while transition from one antidep to another.  Affect less positive. Patient was administered Spravato 84 mg intranasally today.  The patient experienced the typical dissociation which gradually resolved over the 2-hour period of observation.  There were no complications.  Specifically the patient did not have nausea or vomiting or headache.  Specifically HA is less of a problem with Spravato. No SE with meds. Intensity of Spravato dissociation varies.  No scary and well tolerated.   07/14/22 appt noted: Has been more depressed this week.  More trouble with sleep.  Taking clonazepam 1-1.5  of the 0.5 mg tablets.   Trazodone not helping much. She switched back from 200 sertraline to Lexapro 20.  No SE More trouble with motivation.  Some stress with son with special needs. Misses the benevit of Concerta but it was too short acting and crashed in 4-6 hours. Patient was administered Spravato 84 mg intranasally today.  The patient experienced the typical dissociation which gradually resolved over the 2-hour period of observation.  There were no complications.  Specifically the patient did not have nausea or vomiting or headache.  Specifically HA is less of a problem with Spravato. No SE with meds. Intensity of Spravato dissociation varies.  No scary and well tolerated.   07/17/22 appt noted: Patient was administered Spravato 84 mg intranasally today.  The patient experienced the typical dissociation which gradually resolved over the 2-hour period of observation.  There were no complications.  Specifically the patient did not have nausea or vomiting or headache.  Specifically HA is less of a problem with Spravato. No SE with meds. Intensity of Spravato dissociation varies.  Not scary and well tolerated.  Just got Jornay last night but wasn't sure how to take it.  Wants to start and this was discussed.  She felt MPH helped  mood and attn but didn't last long enough and had crash after a few hours on Concerta.  Better for 2 hours after each dose Ritalin 20 mg with mood but then it wore off.  BP up after 3rd dose 160/90 and then took H's amlodipine 5 and BP became normal.  07/22/22 appt noted: Patient was administered Spravato 84 mg intranasally today.  The patient experienced the typical dissociation which gradually resolved over the 2-hour period of observation.  There were no complications.  Specifically the patient did not have nausea or vomiting or headache.  Specifically HA is less of a problem with Spravato. No SE with meds. Intensity of Spravato dissociation varies.  Not scary and well tolerated.  Start Jornay 20 mg over the weekend and did not notice any particular effect good or bad.  Increased to 40 mg. Other psych meds: Clonazepam 0.5 mg tablets 2 nightly, gabapentin dosage varies, Dayvigo 10 mg nightly, Lexapro 20 mg daily, to trazodone 100 mg nightly  07/24/22 appt noted: Patient was administered Spravato 84 mg intranasally today.  The patient experienced the typical dissociation  which gradually resolved over the 2-hour period of observation.  There were no complications.  Specifically the patient did not have nausea or vomiting or headache.  Specifically HA is less of a problem with Spravato. No SE with meds. Intensity of Spravato dissociation varies.  Not scary and well tolerated.  Other psych meds: Clonazepam 0.5 mg tablets 2 nightly, gabapentin dosage varies, Dayvigo 10 mg nightly, Lexapro 20 mg daily, to trazodone 100 mg nightly, Jornay 40 mg PM Doing well with Spravato.  Noticed a little effect from the Jornay 40 without SE but would like to increase the dose for ADD and mood.  Sleeping well.  No new concerns.  Still more depressed than she was with th initial benefit of Spravato.  07/30/22 note:  Patient was administered Spravato 84 mg intranasally today.  The patient experienced the typical  dissociation which gradually resolved over the 2-hour period of observation.  There were no complications.  Specifically the patient did not have nausea or vomiting or headache.  Specifically HA is less of a problem with Spravato. Other psych meds: Clonazepam 0.5 mg tablets 2 nightly, gabapentin dosage varies, Dayvigo 10 mg nightly, Lexapro 20 mg daily, to trazodone 100 mg nightly, Jornay 60 mg PM Wants to increase Jornay to 80 mg PM to increase benefit for ADD and depression. No SE She has experienced a major family crisis that threatens to change her daily life from now on.  It has not triggered suicidal thoughts at this time but she is very afraid.  No desire to change medications.  08/04/22 appt noted" Patient was administered Spravato 84 mg intranasally today.  The patient experienced the typical dissociation which gradually resolved over the 2-hour period of observation.  There were no complications.  Specifically the patient did not have nausea or vomiting or headache.  Specifically HA is less of a problem with Spravato. Other psych meds: Clonazepam 0.5 mg tablets 2 nightly, gabapentin dosage varies, Dayvigo 10 mg nightly, Lexapro 20 mg daily, trazodone 100 mg nightly, Jornay 80 mg PM No SE No benefit or SE with increase Jormay 80 mg.  Says Concerta helped ADD and depression but not Jornay.  However duration was insufficient.  Still depressed and wants change to another form of stimulant.  No concerns with other meds. Continues with strong family stressors creating uncertainty about her future but no quick resolution. No SI Trial Cotempla 17.3 and DC Ritalin & Jornay 80  08/11/2022 appointment noted: Patient was administered Spravato 84 mg intranasally today.  The patient experienced the typical dissociation which gradually resolved over the 2-hour period of observation.  There were no complications.  Specifically the patient did not have nausea or vomiting or headache.  Specifically HA is less  of a problem with Spravato now vs when started it.. Other psych meds: Clonazepam 0.5 mg tablets 2 nightly, gabapentin dosage varies, Dayvigo 10 mg nightly, trazodone 100 mg nightly, cotempla 17.3, switch from Lexapro 20 to sertaline 15o mg . No noticeable effects sertraline from for depression but has seen some antianxiety effects from the sertraline.  No side effects noted.  No side effects with the other medicines.  Still is only getting very brief benefit 3 to 4 hours from Cotempla for ADD and mood.  She wants to try the Daytrana patch as we have discussed before. No SE  08/13/22 appt: Patient was administered Spravato 84 mg intranasally today.  The patient experienced the typical dissociation which gradually resolved over the 2-hour period of observation.  There were no complications.  Specifically the patient did not have nausea or vomiting or headache.  Specifically HA is less of a problem with Spravato now vs when started it.. Other psych meds: Clonazepam 0.5 mg tablets 2 nightly, gabapentin dosage varies, Dayvigo 10 mg nightly, trazodone 100 mg nightly, cotempla 17.3, switch from Lexapro 20 to sertaline 15o mg . No noticeable effects sertraline from for depression but has seen some antianxiety effects from the sertraline.  No side effects noted.  No side effects with the other medicines.  Still is only getting very brief benefit 3 to 4 hours from Cotempla for ADD and mood.  She wants to try the Daytrana patch as we have discussed before.  Hasn't been able to get it yet. No SE Plan.  Continue to try to get Daytrana 30 AM  08/18/22 appt noted: Patient was administered Spravato 84 mg intranasally today.  The patient experienced the typical dissociation which gradually resolved over the 2-hour period of observation.  There were no complications.  Specifically the patient did not have nausea or vomiting or headache.  Specifically HA is less of a problem with Spravato now vs when started it.. Other  psych meds: Clonazepam 0.5 mg tablets 2 nightly, gabapentin dosage varies, Dayvigo 10 mg nightly, trazodone 100 mg nightly, cotempla 17.3, switch from Lexapro 20 to sertaline 15o mg . No noticeable effects sertraline from for depression but has seen some antianxiety effects from the sertraline.  No side effects noted.  No side effects with the other medicines.  Still is only getting very brief benefit 3 to 4 hours from Cotempla for ADD and mood.  She wants to try the Daytrana patch as we have discussed before.  Hasn't been able to get it yet.  Overall depression is some better than it was.   No SE Plan.  Continue to try to get Daytrana 30 AM  08/25/22 appt : No benefit Daytrana.  No SE.  Wants further med changes and interested In retrying pramipexole.   Tolerating meds. Patient was administered Spravato 84 mg intranasally today.  The patient experienced the typical dissociation which gradually resolved over the 2-hour period of observation.  There were no complications.  Specifically the patient did not have nausea or vomiting or headache.  Specifically HA is less of a problem with Spravato now vs when started it.. Other psych meds: Clonazepam 0.5 mg tablets 2 nightly, gabapentin dosage varies, Dayvigo 10 mg nightly, trazodone 100 mg nightly, , switch from Lexapro 20 to sertaline 15o mg . Daytrana 30 AM for a few days. Struggles with depression and no SI.  Reduced interest and mood and motivation and enjoyment and socialization.  08/27/22 appt noted: Psych meds:  Clonazepam 0.5 mg tablets 2 nightly, gabapentin dosage varies, Dayvigo 10 mg nightly, trazodone 100 mg nightly,  sertaline 15o mg . Returned to [employer] AM, pramipexole  Tolerating meds. Patient was administered Spravato 84 mg intranasally today.  The patient experienced the typical dissociation which gradually resolved over the 2-hour period of observation.  There were no complications.  Specifically the patient did not have nausea or vomiting  or headache.   Reports taking cotempla bc brief mood benefit and sig focus and productivity benefit. Took 1 dose pramipexole and felt more dep.  09/01/22 appt noted: Psych meds:  Clonazepam 0.5 mg tablets 2 nightly, gabapentin dosage varies, Dayvigo 10 mg nightly, trazodone 100 mg nightly,  sertaline 15o mg . Returned to Cotempla AM, pramipexole  0.25 mg BID. Tolerating  meds now. Patient was administered Spravato 84 mg intranasally today.  The patient experienced the typical dissociation which gradually resolved over the 2-hour period of observation.  There were no complications.  Specifically the patient did not have nausea or vomiting or headache.   Willing to try the pramipexole and will continue 0.25 mg BID this week and then consider increase if tolerated.  Mood is a little better.  Still looking for further improvement.  09/03/22 appt noted: Psych meds:  Clonazepam 0.5 mg tablets 2 nightly, gabapentin dosage varies, Dayvigo 10 mg nightly, trazodone 100 mg nightly,  sertaline 15o mg . Returned to Cotempla AM, pramipexole  0.25 mg tablet 1 and 1/2 tablets twice daily BID. Tolerating meds now. Patient was administered Spravato 84 mg intranasally today.  The patient experienced the typical dissociation which gradually resolved over the 2-hour period of observation.  There were no complications.  Specifically the patient did not have nausea or vomiting or headache.   Depression may be a little better with the parmipexole and is tolerating it well so far.  Still struggling with focus and residual depression.  Tolerating meds without SE.  More hopeful and able to enjoy some things.  09/08/22 appt  Psych meds:  Clonazepam 0.5 mg tablets 2 nightly, gabapentin dosage varies, Dayvigo 10 mg nightly, trazodone 100 mg nightly,  sertaline 15o mg . Returned to Cotempla AM, pramipexole  0.5 mg BID. Tolerating meds now. Patient was administered Spravato 84 mg intranasally today.  The patient experienced the  typical dissociation which gradually resolved over the 2-hour period of observation.  There were no complications.  Specifically the patient did not have nausea or vomiting or headache.   She has seen her depression drop from a 10/10 prior to treatment with Spravato to now 4/10 With additional benefit noted when she increased the pramipexole to 0.5 mg twice daily.  She is not having side effects with this like she did in the past. Anxiety levels are also improved. She is sleeping okay with the current medications. Plan: continue pramipexole trial off-label and increase more slowly for TRD.  Increase  Gradually  to 0.75 mg BID  09/11/22 appt noted: Psych meds:  Clonazepam 0.5 mg tablets 2 nightly, gabapentin dosage varies, Dayvigo 10 mg nightly, trazodone 100 mg nightly,  sertaline 15o mg . Returned to Cotempla AM, pramipexole  0.75 mg BID. Tolerating meds now. Patient was administered Spravato 84 mg intranasally today.  The patient experienced the typical dissociation which gradually resolved over the 2-hour period of observation.  There were no complications.  Specifically the patient did not have nausea or vomiting or headache.   She has seen her depression drop from a 10/10 prior to treatment with Spravato to now 4/10 With additional benefit noted when she increased the pramipexole to 0.5 mg twice daily.  She is not having side effects with this like she did in the past. Clearly improving with the pramipexole now and tolerating it.  Satisfied with current meds but would consider increasing if it might be helpful.  09/15/22 appt noted: Psych meds:  Clonazepam 0.5 mg tablets 2 nightly, gabapentin dosage varies, Dayvigo 10 mg nightly, trazodone 100 mg nightly,  sertaline 15o mg . Returned to Cotempla AM, pramipexole  0.75 mg BID too anxious and reduced to 0.5 mg BID. Tolerating meds now. Patient was administered Spravato 84 mg intranasally today.  The patient experienced the typical dissociation  which gradually resolved over the 2-hour period of observation.  There were no  complications.  Specifically the patient did not have nausea or vomiting or headache.   Trouble staying asleep longer than 6 hours.  Doesn't feel she can tolerate pramipexole 0.75 mg BID DT anxiety not experienced at  0.5 mg BID.  Otherwise tolerating meds.  Mood is better with Spravato and pramipexole.   Not sleeping as long with meds.  EMA 6 hours.  Wonders about change Disc concerns about waiting for therapy. Feels ready to reduce Spravato to weekly.    09/25/22 appt noted: Psych meds:  Clonazepam 0.5 mg tablets 2 nightly, gabapentin dosage varies, Dayvigo 10 mg nightly, trazodone 100 mg nightly,  sertaline 15o mg . Returned to Cotempla AM, pramipexole  0.75 mg BID too anxious and reduced to 0.5 mg BID. Tolerating meds now. Patient was administered Spravato 84 mg intranasally today.  The patient experienced the typical dissociation which gradually resolved over the 2-hour period of observation.  There were no complications.  Specifically the patient did not have nausea or vomiting or headache.   Trouble staying asleep longer than 6 hours.  Failed to respond to Seroquel 25 mg HS.  Gone back to [facility].  Belsomra NR. Mood ok until the last few days more dep bc it's been 10 days since last admin.  Wants to continue weekly.   No SE.  No med changes desired.  09/29/22 appt noted: Psych meds:  Clonazepam 0.5 mg tablets 2 nightly, gabapentin dosage varies, Dayvigo 10 mg nightly, trazodone 100 mg nightly,  sertaline 15o mg . Returned to Cotempla AM, pramipexole  0.75 mg BID too anxious and reduced to 0.5 mg BID. Tolerating meds now. Patient was administered Spravato 84 mg intranasally today.  The patient experienced the typical dissociation which gradually resolved over the 2-hour period of observation.  There were no complications.  Specifically the patient did not have nausea or vomiting or headache.   Trouble staying asleep  longer than 6 hours.  Plan: Continue sertaline to 200 mg daily.  It has helped anxiety but not depression.   for insomnia, clonazepam 1 mg HS. trazodone doesn't work well but helps some Continue Dayvigo 10 HS.  Failed resp Belsomra 15 and seroquel 25 She wants to continue Cotempla or Concerta bc helps mood early in day and focus  benefit lasts longer.  Have not been able to get stimulant to be consitently effective throughout the day. continue pramipexole trial off-label and reduce back to  TRD 0.5 mg BID Spravato weekly.  10/13/22 appt noted: Not sleeping as well.  Wants to increase Spravato to twice weekly bc feeling more dep close to the next sched date.  Just the day or 2 before.  .  Plan: Trial for off label TRD increase pramipexole 1 mg TID  10/20/22 appt noted: Psych meds:  Clonazepam 0.5 mg tablets 2 nightly, gabapentin dosage varies, Dayvigo 10 mg nightly, trazodone 100 mg nightly,  sertaline 15o mg . Returned to Cotempla AM, pramipexole  1 mg TID. Tolerating meds now. Patient was administered Spravato 84 mg intranasally today.  The patient experienced the typical dissociation which gradually resolved over the 2-hour period of observation.  There were no complications.  Specifically the patient did not have nausea or vomiting or headache.   Trouble staying asleep longer than 6 hours.  More dep this week and asked to get Spravato twice this week. Plan: Trial for off label TRD increase pramipexole 1 mg QID  10/22/22 appt noted:  Psych meds:  Clonazepam 0.5 mg tablets 2 nightly and  another one with EMA, gabapentin dosage varies, Belsomra 20 mg daily,  trazodone 100 mg nightly,  sertaline 15o mg . Returned to Cotempla AM, pramipexole  1 mg QID for 2nd day Tolerating meds now.  Except a little N and dizzy briefly after takes it. Patient was administered Spravato 84 mg intranasally today.  The patient experienced the typical dissociation which gradually resolved over the 2-hour period of  observation.  There were no complications.  Specifically the patient did not have nausea or vomiting or headache. No change in mood yet.   Sleep better with Belsomra 20 as long as takes with other meds.  If insomnia mood is worse. Ongoing some dep but gets partial relief with Spravato but would like better relief .  10/27/22 received Spravato 84  11/03/22 appt noted:  Meds as above except sertraline she cut to 100 mg instead of 200 mg daily She doesn't think sertraline helping and worries over poss SE Off and on Cotempla .  Not affecting BP. Doesn't notice benefit or SE with pramipexole 1 mg QID.  No SE Sleep is OK.  Some awakening aftter 6 hours but not enough so takes another clonazepam.   Patient was administered Spravato 84 mg intranasally today.  The patient experienced the typical dissociation which gradually resolved over the 2-hour period of observation.  There were no complications.  Specifically the patient did not have nausea or vomiting or headache. No change in mood yet.   Sleep better with Belsomra 20 as long as takes with other meds.  If insomnia mood is worse. Ongoing some dep but gets partial relief with Spravato but would like better relief .  No sig benefit with pramipexole 1 mg QID.  11/21/22 TC:  To ER with dep 11/21/22. No SE with selegiline but feels more dep.  Wants to stop it. Today is only day 2 of 1 tablet of selegiline 5 mg AM.  Reviewed with her that she had not been satisfied with the response of Spravato and Zoloft and that she is likely feeling worse because the Zoloft is wearing off and in particular because she abruptly stopped 150 mg daily.  She needs to give the selegiline time to work.  She agrees to do so.  She admitted to some passive suicidal thoughts but no active suicidal thoughts and no desire to die.  She agrees to the plan to increase selegiline to 10 mg every morning this weekend and to continue the selegiline trial.  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  She is aware.  Meredith Staggers, MD, DFAPA  12/19/22 TC: Pt called at 1:42p.  She said she wanted to know if she could start weaning off the Selegiline.  She thinks increasing the dosage is making things worse.  She said she wants to go back to [employer].  She acknowleged she has an appt on Monday. I advised her that I wasn't sure Dr Jennelle Human will see her message before her appt, but she still ask me to send the message.  Next appt 8/26 MD resp.  No change until Monday appt.  12/22/22 appt noted: Meds: selegiline up to 5 mg 4 daily for a week but felt it incr irritability and went back to 3 mg daily. She wants to go back to Spravato.  Not near as dep as now.  Selegiline didn't help energy. Still on Belsomra Thinks maybe sertraline helped mood more than she thought. More dep  than anxiety.   Plan: DC selegiline and will retry Effexor which helped in the past at 300 mg daily.  Start this Friday after over 10 half lives of selegiline.    12/24/22 appt noted: Stopped selegiline  Psych med: belsomra 20, clonazepam 1 mg HS.   She has felt more dep off the Spravato though at the time didn't think it was helping.  Has felt more down and withdrawn.  Low energy, motivation, not smiling.  No SI.  Sleep ok.   Patient was administered Spravato 84 mg intranasally today.  The patient experienced the typical dissociation which gradually resolved over the 2-hour period of observation.  There were no complications.  Specifically the patient did not have nausea or vomiting or headache. No problems with BP or other unusual SE Mood is a little better after this administration of Spravato.  Very motivated to continue it.  No immediate med concerns. No SI .  Ongoing anhedonia.  12/25/22 appt noted: Psych med: belsomra 20, clonazepam 1 mg HS.  Not resumed stimulant or AD yet   No SE. Patient was administered Spravato 84 mg intranasally today.  The patient experienced  the typical dissociation which gradually resolved over the 2-hour period of observation.  There were no complications.  Specifically the patient did not have nausea or vomiting or headache.  There is a lot things going on in her life and it is somewhat difficult to separate stressors from what is going on with her mood.  However she is happy to resume Spravato as she decided after stopping briefly that it was helpful.  She plans to start the antidepressant Morrow.  She also wants to resume the stimulant tomorrow.  12/30/22 appt noted; Psych med: belsomra 20, clonazepam 1 mg HS.  Not resumed stimulant.  Effexor XR 37.5 mg daily   No SE. Patient was administered Spravato 84 mg intranasally today.  The patient experienced the typical dissociation which gradually resolved over the 2-hour period of observation.  There were no complications.  Specifically the patient did not have nausea or vomiting or headache.  She is still having depression with very prominent anhedonia.  However she feels the Spravato is somewhat helpful and is hopeful for more complete response as she gets back onto a reasonable antidepressant dose.  She tolerated Effexor XR 300 mg daily in the past and took it for 5 months and it is probably the antidepressants worked best for her.  She is agreeable with this plan.  She is getting sleep benefit from the current medications Plan: incr Effexor 75 mg daily  01/02/23 appt noted: Psych meds as noted above except increase Effexor XR to 75 mg daily without side effects. Patient was administered Spravato 84 mg intranasally today.  The patient experienced the typical dissociation which gradually resolved over the 2-hour period of observation.  There were no complications.  Specifically the patient did not have nausea or vomiting or headache.  Agrees to increasing Effexor as planned.  SX not changed from that noted above.  No SI  01/05/23 appt noted: Psych meds as noted above except increase Effexor  XR to 112.5 mg daily without side effects. Patient was administered Spravato 84 mg intranasally today.  The patient experienced the typical dissociation which gradually resolved over the 2-hour period of observation.  There were no complications.  Specifically the patient did not have nausea or vomiting or headache.  Agrees to increasing Effexor as planned gradually to 300 mg daily.  SX not changed  from that noted above.  Does however feel Spravato has dampened the degree of depression.  No SI Stressful life event discussed and won't be resolved until early next year.  AIMS    Flowsheet Row Video Visit from 01/03/2022 in [facility]  AIMS Total Score 0      ECT-MADRS    Flowsheet Row Clinical Support from 08/13/2022 in [facility] Video Visit from 04/29/2022 in [facility] Video Visit from 02/06/2022 in [facility]  MADRS Total Score 27 44 23      GAD-7    Flowsheet Row Counselor from 12/19/2022 in [facility] Office Visit from 08/22/2022 in [facility] [place]  Total GAD-7 Score 4 7      PHQ2-9    Flowsheet Row Counselor from 12/19/2022 in [facility] Office Visit from 08/22/2022 in [facility] [place] Office Visit from 08/19/2022 in [facility] at [facility] Video Visit from 02/06/2022 in [facility] Office Visit from 11/16/2020 in [facility] at [facility] Total Score 2 3 0 6 0  PHQ-9 Total Score 5 5 -- 14 --        Past Psychiatric Medication Trials: Trintellix - Initially effective and then was not effective when re-started Paxil- Ineffective. Helped initially. Prozac-Insomnia Lexapro- Effective and then no longer as effective Zoloft- Initially effective  and then minimal response Viibryd Cymbalta- Ineffective. Helped initially. Effexor XR 300- Effective, well tolerated. Pristiq- Increased anxiety at 150 mg daily Wellbutrin XL-Ineffective.May have increased anxiety. Amitriptyline Nortriptyline-Caused irritability, constipation Auvelity- ineffective Selegilne brief 20 mg daily , more irritable  Abilify- Helpful but caused severe insomnia. Rexulti-Helpful but caused insomnia. Vraylar Seroquel - Ineffective Risperdal Olanzapine- Ineffective Latuda- Ineffective Caplyta  Klonopin- Effective Temazepam- Took during menopause Lunesta- Ineffective Ambien-Ineffective Sonata- Ineffective Dayvigo- partially effective Failed resp Belsomra 15 and seroquel 25 Trazodone-  somewhat effective Remeron-Ineffective. Does not recall wt gain.  Quetiapine 25 NR  Adderall- Took once and had adverse effect. Concerta- Effective but only 4-6 hours Jornay 80 NR Metadate-Not as effective as Concerta Dexmethylphenidate- Not as effective as Concerta Azstarys- some side effects. Not as effective.  Ritalin short acting and SE anxiety & HTN @20  TID Cotemplay 17.3  Daytrana  Lamictal- May have had an adverse effects. "Felt weird." Reports worsening s/s.  Lithium ? effect Gabapentin Pramipexole- 1 mg QID NR  ECT #20 with minimal response. H says it was partly helpful.  AIMS    Flowsheet Row Video Visit from 01/03/2022 in [facility]  AIMS Total Score 0      ECT-MADRS    Flowsheet Row Clinical Support from 08/13/2022 in [facility] Video Visit from 04/29/2022 in [facility] Video Visit from 02/06/2022 in [facility]  MADRS Total Score 27 44 23      GAD-7    Flowsheet Row Counselor from 12/19/2022 in [facility] Office Visit from 08/22/2022 in [facility] [place]  Total GAD-7 Score  4 7      PHQ2-9    Flowsheet Row Counselor from 12/19/2022 in [facility] Office Visit from 08/22/2022 in [facility] [place] Office Visit from 08/19/2022 in [facility] at [facility] Video  Visit from 02/06/2022 in [facility] Office Visit from 11/16/2020 in [facility] for [facility] at [facility]  PHQ-2 Total Score 2 3 0 6 0  PHQ-9 Total Score 5 5 -- 14 --        Review of Systems:  Review of Systems  Cardiovascular:  Negative for palpitations.  Neurological:  Negative for dizziness, tremors and weakness.  Psychiatric/Behavioral:  Positive for dysphoric mood and sleep disturbance. Negative for agitation and decreased concentration. The patient is nervous/anxious.     Medications: I have reviewed the patient's current medications.  Current Outpatient Medications  Medication Sig Dispense Refill   clonazePAM (KLONOPIN) 0.5 MG tablet 2 tablets at night and 1 tablet as needed daily for panic anxiety (Patient taking differently: Take 1 mg by mouth at bedtime.) 75 tablet 1   Esketamine HCl, 84 MG Dose, (SPRAVATO, 84 MG DOSE,) 28 MG/DEVICE SOPK Place 84 mg into the nose every 3 (three) days. 3 each 5   Estradiol (VAGIFEM) 10 MCG TABS vaginal tablet Place 1 tablet (10 mcg total) vaginally 2 (two) times a week. 24 tablet 3   Suvorexant (BELSOMRA) 20 MG TABS TAKE 1 TABLET BY MOUTH AT BEDTIME 30 tablet 2   valACYclovir (VALTREX) 500 MG tablet Take one twice daily for 3 days with any symptoms 30 tablet 3   valsartan (DIOVAN) 80 MG tablet Take 1 tablet (80 mg total) by mouth daily. 90 tablet 3   venlafaxine XR (EFFEXOR XR) 37.5 MG 24 hr capsule 1 capsule daily for 4 days, then 2 capsules daily (Patient taking differently: Take 112.5 mg by mouth daily with breakfast.) 30 capsule 0   No current facility-administered medications for this visit.    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   TRANSAMINASES, SERUM, ELEVATED 08/14/2009    Past Medical History, Surgical history, Social history, and Family history were reviewed and updated as appropriate.   Please see review of systems for further details on the patient's review from today.   Objective:   Physical Exam:  LMP 12/27/2007 (Exact Date)   Physical Exam Constitutional:      General: She is not in acute distress. Musculoskeletal:        General: No deformity.  Neurological:     Mental Status: She is alert and oriented to person, place, and time.     Coordination: Coordination normal.  Psychiatric:        Attention and Perception: Attention and perception normal. She does not perceive auditory hallucinations.        Mood and Affect: Mood is anxious and depressed. Affect is blunt.        Speech: Speech normal. Speech is not slurred.        Behavior: Behavior normal.        Thought Content: Thought content normal. Thought content is not delusional. Thought content does not include homicidal or suicidal ideation. Thought content does not include suicidal plan.        Cognition and Memory: Cognition and memory normal.  Judgment: Judgment normal.     Comments: Insight intact Mood still mod dep  Affect blunted without change     Lab Review:     Component Value Date/Time   NA 132 (L) 10/27/2022 1107   K 4.4 10/27/2022 1107   CL 95 (L) 10/27/2022 1107   CO2 30 10/27/2022 1107   GLUCOSE 89 10/27/2022 1107   BUN 14 10/27/2022 1107   CREATININE 0.67 10/27/2022 1107   CREATININE 0.68 01/26/2020 0920   CALCIUM 10.1 10/27/2022  1107   PROT 8.3 04/22/2022 0932   ALBUMIN 5.3 (H) 04/22/2022 0932   AST 82 (H) 04/22/2022 0932   ALT 118 (H) 04/22/2022 0932   ALKPHOS 74 04/22/2022 0932   BILITOT 0.3 04/22/2022 0932   GFRNONAA >90 05/01/2014 1645   GFRAA >90 05/01/2014 1645       Component Value Date/Time   WBC 3.3 (L) 04/22/2022 0932   RBC 4.25 04/22/2022 0932   HGB 14.3 04/22/2022 0932   HGB 14.1 10/26/2014 1034   HCT 40.7 04/22/2022 0932   PLT 245.0 04/22/2022 0932   MCV 95.7 04/22/2022 0932   MCH 34.4 (H) 01/26/2020 0920   MCHC 35.1 04/22/2022 0932   RDW 13.2 04/22/2022 0932   LYMPHSABS 0.9 04/22/2022 0932   MONOABS 0.2 04/22/2022 0932   EOSABS 0.1 04/22/2022 0932   BASOSABS 0.0 04/22/2022 0932    No results found for: "POCLITH", "LITHIUM"   No results found for: "PHENYTOIN", "PHENOBARB", "VALPROATE", "CBMZ"   .res Assessment: Plan:    Dannetta was seen today for follow-up, depression, add, anxiety, stress and sleeping problem.  Diagnoses and all orders for this visit:  Recurrent major depression resistant to treatment [facility])  Generalized anxiety disorder  Attention deficit hyperactivity disorder (ADHD), predominantly inattentive type  Insomnia, unspecified type  Stressful life event affecting family    Pt seen for TRD with multiple failed medications as noted above.  Some of these failures have been related to an adequate dose and duration as she has had a tendency to get frustrated with medication trials.TRD need 6 week trial of AD at full dose at least.   She has had no problems with the transition which has been started from selegiline to Effexor XR.  No SE thus far.  Patient was administered Spravato 84 mg intranasally today.  The patient experienced the typical dissociation which gradually resolved over the 2-hour period of observation.  There were no complications.  Specifically the patient did not have nausea or vomiting or headache.  Blood pressures remained within normal ranges at  the 40-minute and 2-hour follow-up intervals.  By the time the 2-hour observation period was met the patient was alert and oriented and able to exit without assistance.  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  See nursing note for further details.  Consider switch Spravato to MAOI, Parnate  We discussed the short-term risks associated with benzodiazepines including sedation and increased fall risk among others.  Discussed long-term side effect risk including dependence, potential withdrawal symptoms, and the potential eventual dose-related risk of dementia.  But recent studies from 2020 dispute this association between benzodiazepines and dementia risk. Newer studies in 2020 do not support an association with dementia.  for insomnia, clonazepam 1 mg HS.  Some better so far with Belsomra 20 mg HS  She wants to resume Cotempla or Concerta bc helps mood early in day and focus  benefit lasts longer.  Have not been able to get stimulant to be consitently effective throughout the day.  increasing venlafaxine XR 112.5 mg.  She will increase every 3 to 4 days or as tolerated to try to get to XR 300 mg daily as soon as possible.  This was a dose that she took and tolerated in the past.  No other med change today  Supportive therapy dealing with serious stressful life event.  FU weekly Spravato .  Meredith Staggers, MD, DFAPA    Please see After Visit Summary for patient specific instructions.  Future Appointments  Date Time Provider Department Center  01/07/2023  9:00 AM Cottle, Steva Ready., MD [facility] None  01/07/2023  9:00 AM CP-NURSE [facility] None  01/29/2023 11:00 AM Gaspar Bidding, [facility] None  02/09/2023  1:00 PM Gaspar Bidding, [facility] CP[facility] None  02/16/2023  1:00 PM Gaspar Bidding [facility] None  02/23/2023  1:00 PM Gaspar Bidding [facility] CP-CP None  03/02/2023  2:00 PM Gaspar Bidding, [facility] CP-CP None  09/18/2023  8:15 AM Jerene Bears, MD [facility]    No orders of the defined types were placed in this encounter.   -------------------------------

## 2023-01-07 ENCOUNTER — Ambulatory Visit (INDEPENDENT_AMBULATORY_CARE_PROVIDER_SITE_OTHER): Payer: 59 | Admitting: Psychiatry

## 2023-01-07 ENCOUNTER — Encounter: Payer: Self-pay | Admitting: Psychiatry

## 2023-01-07 ENCOUNTER — Other Ambulatory Visit: Payer: Self-pay | Admitting: Psychiatry

## 2023-01-07 ENCOUNTER — Ambulatory Visit: Payer: 59

## 2023-01-07 VITALS — BP 140/85 | HR 57

## 2023-01-07 DIAGNOSIS — F339 Major depressive disorder, recurrent, unspecified: Secondary | ICD-10-CM

## 2023-01-07 DIAGNOSIS — G4700 Insomnia, unspecified: Secondary | ICD-10-CM

## 2023-01-07 DIAGNOSIS — F411 Generalized anxiety disorder: Secondary | ICD-10-CM

## 2023-01-07 DIAGNOSIS — Z6379 Other stressful life events affecting family and household: Secondary | ICD-10-CM

## 2023-01-07 DIAGNOSIS — F900 Attention-deficit hyperactivity disorder, predominantly inattentive type: Secondary | ICD-10-CM

## 2023-01-07 NOTE — Progress Notes (Signed)
NURSES NOTE:           Patient arrived for her #49 Spravato treatment. Pt is coming twice a week currently after a change in medication for 1 month. Pt is being treated for Treatment Resistant Depression, pt will be receiving 84 mg (3 of the 28 mg) nasal sprays, this is her maintenance dose. Patient taken to treatment room, I explained and discussed her treatment and how the schedule should go, along with any side effects that may occur. Answered any questions and concerns the patient had. Pt's Spravato is delivered through [place] and she is now billing with her insurance. Spravato medication is stored at doctors office per REMS/FDA guidelines. The medication is required to be locked behind two doors per FDA/REMS Protocol. Medication is also disposed of properly per regulations. All treatments and her vital signs are documented in Spravato REMS per protocol of treatment center regulations. [facility] was notified pt is restarting, and will need her Spravato delivered again on a regular basis. No new PA is needed at this time.      Vital Signs assessed at 8:10 AM 130/77, pulse 52, pulse ox 92%. Instructed pt to blow her nose and recline back slightly to prevent any of medication dripping out of her nose.   Pt. Given 1st dose (28 mg inhaler), then 5 minutes between 2nd dose and 3rd dose for total of 84 mg. No complaints of nausea/vomiting reported. Pt did listen to music today, she just reclined back and closed her eyes. After 40 minutes I went to assess her vital signs, no side effects or symptoms reported, B/P 128/78, pulse 58. Dr. Jennelle Human talked to pt today about her treatment. Nurse was with pt a total of 60 minutes but pt observed for 120 minutes per protocol. Discharge vital signs were at 9:55 AM, B/P 140/85, pulse 60. She verbalized understanding. She reports dissociation but she was clear upon her discharge. She is scheduled on Monday, September 16th.  She is instructed to contact office if needing  anything prior to her next treatment.     LOT # [id number]   EXP APR 2027

## 2023-01-07 NOTE — Progress Notes (Signed)
Heather Davila [id number] 1960/07/30 62 y.o.  Subjective:   Patient ID:  Heather Davila is a 62 y.o. (DOB 1960/10/29) female.  Chief Complaint:  Chief Complaint  Patient presents with   Follow-up   Depression   Anxiety   Stress    HPI Heather Davila presents to the office today for follow-up of treatment resistant recurrent depression, generalized anxiety disorder and insomnia.  Almost too numerous to count med failures. She is here for Spravato administration for her treatment resistant depression.  05/21/22 appt noted: Patient was administered first dose Spravato 56 mg intranasally today.  The patient experienced the typical dissociation which gradually resolved over the 2-hour period of observation.  There were no complications.  Specifically the patient did not have nausea or vomiting or headache.   Dissociation was mild. Did not experience any emotional relief from disabling depression.wants to increase Spravato to the usual dose.  She is aware insurance is not covering the costs at this time.  No SI acutely. Tolerating meds.    05/23/22 appt noted: Patient was administered Spravato 84 mg intranasally today.  The patient experienced the typical dissociation which gradually resolved over the 2-hour period of observation.  There were no complications.  Specifically the patient did not have nausea or vomiting.   Dissociation was greater than last dose and a little bothersome DT some HA. Tolerating meds.   Mood has not changed significantly thus far with Spravato.  05/27/22 appt noted: Patient was administered Spravato 84 mg intranasally today.  The patient experienced the typical dissociation which gradually resolved over the 2-hour period of observation.  There were no complications.  Specifically the patient did not have nausea or vomiting or headache. Today she has felt a little relief from the pressing heaviness of depression but a little more anxiety.  During administration of  Spravato she listens to [place] music and was able to relax a little more with the feeling of dissociation that was mildly bothersome. Husband was also in on the session today and asked some questions about IV ketamine versus nasal spray ketamine.  05/29/22 appt noted: Cancelled today DT hypertension  05/30/22 appt noted: Patient was administered Spravato 84 mg intranasally today.  The patient experienced the typical dissociation which gradually resolved over the 2-hour period of observation.  There were no complications.  Specifically the patient did not have nausea or vomiting or headache. She is seeing some improvement in mood with less continuous depression and less intensity.  Hopefulness is better.   No med complaints or SE  06/05/22 appt noted: Patient was administered Spravato 84 mg intranasally today.  The patient experienced the typical dissociation which gradually resolved over the 2-hour period of observation.  There were no complications.  Specifically the patient did not have nausea or vomiting or headache.  Specifically HA is less of a problem with Spravato. No SE with meds. Depression is improving with less intensity.  Intermittent anxiety easily.  More able to enjoy things.  No new concerns.  06/17/22 appt noted: Patient was administered Spravato 84 mg intranasally today.  The patient experienced the typical dissociation which gradually resolved over the 2-hour period of observation.  There were no complications.  Specifically the patient did not have nausea or vomiting or headache.  Specifically HA is less of a problem with Spravato. No SE with meds. Depression is improved 45% with less intensity.  Intermittent anxiety less easily.  More able to enjoy things.  No new concerns.  More active. Current meds: increased  clonazepam to about 1.5 mg HS DT recent insomnia, lexapro 20, Dayvigo 10 mg HS, concerta 36 mg AM, trazodone 100 mg HS.  06/20/22 appt noted: Current meds: increased  clonazepam to about 1.5 mg HS DT recent insomnia, lexapro 20, Dayvigo 10 mg HS, concerta 36 mg AM, trazodone 100 mg HS. Patient was administered Spravato 84 mg intranasally today.  The patient experienced the typical dissociation which gradually resolved over the 2-hour period of observation.  There were no complications.  Specifically the patient did not have nausea or vomiting or headache.  Specifically HA is less of a problem with Spravato. No SE with meds. Depression is improved 45% with less intensity.  Intermittent anxiety less easily.  More able to enjoy things.  No new concerns.  More active.  06/23/22 appt noted:  Current meds: increased clonazepam to about 1.5 mg HS DT recent insomnia, lexapro 20, Dayvigo 10 mg HS, concerta 36 mg AM, trazodone 100 mg HS. Patient was administered Spravato 84 mg intranasally today.  The patient experienced the typical dissociation which gradually resolved over the 2-hour period of observation.  There were no complications.  Specifically the patient did not have nausea or vomiting or headache.  Specifically HA is less of a problem with Spravato. No SE with meds. Intensity of Spravato dissociation varies.  Last week very intesne and this week more typical.  Depression is continuing to improve with better interest and activity and motivation.  Gradual improvement.  Wants to continue.  06/25/22 appt noted: Current meds: increased clonazepam to about 1.5 mg HS DT recent insomnia, lexapro 20, Dayvigo 10 mg HS, concerta 36 mg AM, trazodone 100 mg HS. Patient was administered Spravato 84 mg intranasally today.  The patient experienced the typical dissociation which gradually resolved over the 2-hour period of observation.  There were no complications.  Specifically the patient did not have nausea or vomiting or headache.  Specifically HA is less of a problem with Spravato. No SE with meds. Intensity of Spravato dissociation varies.  No scary and well  tolerated. Continues to gradually improve with Spravato.  She is more motivated and enjoying things more.  H sees progress.  Wants to continue twice weekly until gets maximum response.  Dep is not gone yet.  06/30/22 appt noted:  seen with H Current meds: increased clonazepam to about 1.5 mg HS DT recent insomnia, lexapro 20, Dayvigo 10 mg HS, concerta 36 mg AM, trazodone 100 mg HS. Patient was administered Spravato 84 mg intranasally today.  The patient experienced the typical dissociation which gradually resolved over the 2-hour period of observation.  There were no complications.  Specifically the patient did not have nausea or vomiting or headache.  Specifically HA is less of a problem with Spravato. No SE with meds. Intensity of Spravato dissociation varies.  No scary and well tolerated. Continues to gradually improve with Spravato.  She is more motivated and enjoying things more.  H sees even more progress than she does; more active and smiling.  Wants to continue twice weekly until gets maximum response.  Dep is 80% better.  07/07/22 appt noted: Current meds: increased clonazepam to about 1.5 mg HS DT recent insomnia, lexapro 20, Dayvigo 10 mg HS, concerta 36 mg AM, trazodone 100 mg HS. Patient was administered Spravato 84 mg intranasally today.  The patient experienced the typical dissociation which gradually resolved over the 2-hour period of observation.  There were no complications.  Specifically the patient did not have nausea or vomiting or  headache.  Specifically HA is less of a problem with Spravato. No SE with meds. Intensity of Spravato dissociation varies.  No scary and well tolerated.  Was more intese today. Overall mood is markedly better and please with meds. No changes desired.  07/09/22 appt noted: In transition from Lexapro to sertraline and missing Concerta bc CO crash from it after about 4 hours.  Disc these concerns.  She'll discuss also with Corie Chiquito, NP More dep the  last couple of days and concerned about reducing Spravato frequency while transition from one antidep to another.  Affect less positive. Patient was administered Spravato 84 mg intranasally today.  The patient experienced the typical dissociation which gradually resolved over the 2-hour period of observation.  There were no complications.  Specifically the patient did not have nausea or vomiting or headache.  Specifically HA is less of a problem with Spravato. No SE with meds. Intensity of Spravato dissociation varies.  No scary and well tolerated.   07/14/22 appt noted: Has been more depressed this week.  More trouble with sleep.  Taking clonazepam 1-1.5  of the 0.5 mg tablets.   Trazodone not helping much. She switched back from 200 sertraline to Lexapro 20.  No SE More trouble with motivation.  Some stress with son with special needs. Misses the benevit of Concerta but it was too short acting and crashed in 4-6 hours. Patient was administered Spravato 84 mg intranasally today.  The patient experienced the typical dissociation which gradually resolved over the 2-hour period of observation.  There were no complications.  Specifically the patient did not have nausea or vomiting or headache.  Specifically HA is less of a problem with Spravato. No SE with meds. Intensity of Spravato dissociation varies.  No scary and well tolerated.   07/17/22 appt noted: Patient was administered Spravato 84 mg intranasally today.  The patient experienced the typical dissociation which gradually resolved over the 2-hour period of observation.  There were no complications.  Specifically the patient did not have nausea or vomiting or headache.  Specifically HA is less of a problem with Spravato. No SE with meds. Intensity of Spravato dissociation varies.  Not scary and well tolerated.  Just got Jornay last night but wasn't sure how to take it.  Wants to start and this was discussed.  She felt MPH helped mood and attn but  didn't last long enough and had crash after a few hours on Concerta.  Better for 2 hours after each dose Ritalin 20 mg with mood but then it wore off.  BP up after 3rd dose 160/90 and then took H's amlodipine 5 and BP became normal.  07/22/22 appt noted: Patient was administered Spravato 84 mg intranasally today.  The patient experienced the typical dissociation which gradually resolved over the 2-hour period of observation.  There were no complications.  Specifically the patient did not have nausea or vomiting or headache.  Specifically HA is less of a problem with Spravato. No SE with meds. Intensity of Spravato dissociation varies.  Not scary and well tolerated.  Start Jornay 20 mg over the weekend and did not notice any particular effect good or bad.  Increased to 40 mg. Other psych meds: Clonazepam 0.5 mg tablets 2 nightly, gabapentin dosage varies, Dayvigo 10 mg nightly, Lexapro 20 mg daily, to trazodone 100 mg nightly  07/24/22 appt noted: Patient was administered Spravato 84 mg intranasally today.  The patient experienced the typical dissociation which gradually resolved over the 2-hour period  of observation.  There were no complications.  Specifically the patient did not have nausea or vomiting or headache.  Specifically HA is less of a problem with Spravato. No SE with meds. Intensity of Spravato dissociation varies.  Not scary and well tolerated.  Other psych meds: Clonazepam 0.5 mg tablets 2 nightly, gabapentin dosage varies, Dayvigo 10 mg nightly, Lexapro 20 mg daily, to trazodone 100 mg nightly, Jornay 40 mg PM Doing well with Spravato.  Noticed a little effect from the Jornay 40 without SE but would like to increase the dose for ADD and mood.  Sleeping well.  No new concerns.  Still more depressed than she was with th initial benefit of Spravato.  07/30/22 note:  Patient was administered Spravato 84 mg intranasally today.  The patient experienced the typical dissociation which gradually  resolved over the 2-hour period of observation.  There were no complications.  Specifically the patient did not have nausea or vomiting or headache.  Specifically HA is less of a problem with Spravato. Other psych meds: Clonazepam 0.5 mg tablets 2 nightly, gabapentin dosage varies, Dayvigo 10 mg nightly, Lexapro 20 mg daily, to trazodone 100 mg nightly, Jornay 60 mg PM Wants to increase Jornay to 80 mg PM to increase benefit for ADD and depression. No SE She has experienced a major family crisis that threatens to change her daily life from now on.  It has not triggered suicidal thoughts at this time but she is very afraid.  No desire to change medications.  08/04/22 appt noted" Patient was administered Spravato 84 mg intranasally today.  The patient experienced the typical dissociation which gradually resolved over the 2-hour period of observation.  There were no complications.  Specifically the patient did not have nausea or vomiting or headache.  Specifically HA is less of a problem with Spravato. Other psych meds: Clonazepam 0.5 mg tablets 2 nightly, gabapentin dosage varies, Dayvigo 10 mg nightly, Lexapro 20 mg daily, trazodone 100 mg nightly, Jornay 80 mg PM No SE No benefit or SE with increase Jormay 80 mg.  Says Concerta helped ADD and depression but not Jornay.  However duration was insufficient.  Still depressed and wants change to another form of stimulant.  No concerns with other meds. Continues with strong family stressors creating uncertainty about her future but no quick resolution. No SI Trial Cotempla 17.3 and DC Ritalin & Jornay 80  08/11/2022 appointment noted: Patient was administered Spravato 84 mg intranasally today.  The patient experienced the typical dissociation which gradually resolved over the 2-hour period of observation.  There were no complications.  Specifically the patient did not have nausea or vomiting or headache.  Specifically HA is less of a problem with Spravato now  vs when started it.. Other psych meds: Clonazepam 0.5 mg tablets 2 nightly, gabapentin dosage varies, Dayvigo 10 mg nightly, trazodone 100 mg nightly, cotempla 17.3, switch from Lexapro 20 to sertaline 15o mg . No noticeable effects sertraline from for depression but has seen some antianxiety effects from the sertraline.  No side effects noted.  No side effects with the other medicines.  Still is only getting very brief benefit 3 to 4 hours from Cotempla for ADD and mood.  She wants to try the Daytrana patch as we have discussed before. No SE  08/13/22 appt: Patient was administered Spravato 84 mg intranasally today.  The patient experienced the typical dissociation which gradually resolved over the 2-hour period of observation.  There were no complications.  Specifically the  patient did not have nausea or vomiting or headache.  Specifically HA is less of a problem with Spravato now vs when started it.. Other psych meds: Clonazepam 0.5 mg tablets 2 nightly, gabapentin dosage varies, Dayvigo 10 mg nightly, trazodone 100 mg nightly, cotempla 17.3, switch from Lexapro 20 to sertaline 15o mg . No noticeable effects sertraline from for depression but has seen some antianxiety effects from the sertraline.  No side effects noted.  No side effects with the other medicines.  Still is only getting very brief benefit 3 to 4 hours from Cotempla for ADD and mood.  She wants to try the Daytrana patch as we have discussed before.  Hasn't been able to get it yet. No SE Plan.  Continue to try to get Daytrana 30 AM  08/18/22 appt noted: Patient was administered Spravato 84 mg intranasally today.  The patient experienced the typical dissociation which gradually resolved over the 2-hour period of observation.  There were no complications.  Specifically the patient did not have nausea or vomiting or headache.  Specifically HA is less of a problem with Spravato now vs when started it.. Other psych meds: Clonazepam 0.5 mg  tablets 2 nightly, gabapentin dosage varies, Dayvigo 10 mg nightly, trazodone 100 mg nightly, cotempla 17.3, switch from Lexapro 20 to sertaline 15o mg . No noticeable effects sertraline from for depression but has seen some antianxiety effects from the sertraline.  No side effects noted.  No side effects with the other medicines.  Still is only getting very brief benefit 3 to 4 hours from Cotempla for ADD and mood.  She wants to try the Daytrana patch as we have discussed before.  Hasn't been able to get it yet.  Overall depression is some better than it was.   No SE Plan.  Continue to try to get Daytrana 30 AM  08/25/22 appt : No benefit Daytrana.  No SE.  Wants further med changes and interested In retrying pramipexole.   Tolerating meds. Patient was administered Spravato 84 mg intranasally today.  The patient experienced the typical dissociation which gradually resolved over the 2-hour period of observation.  There were no complications.  Specifically the patient did not have nausea or vomiting or headache.  Specifically HA is less of a problem with Spravato now vs when started it.. Other psych meds: Clonazepam 0.5 mg tablets 2 nightly, gabapentin dosage varies, Dayvigo 10 mg nightly, trazodone 100 mg nightly, , switch from Lexapro 20 to sertaline 15o mg . Daytrana 30 AM for a few days. Struggles with depression and no SI.  Reduced interest and mood and motivation and enjoyment and socialization.  08/27/22 appt noted: Psych meds:  Clonazepam 0.5 mg tablets 2 nightly, gabapentin dosage varies, Dayvigo 10 mg nightly, trazodone 100 mg nightly,  sertaline 15o mg . Returned to [employer] AM, pramipexole  Tolerating meds. Patient was administered Spravato 84 mg intranasally today.  The patient experienced the typical dissociation which gradually resolved over the 2-hour period of observation.  There were no complications.  Specifically the patient did not have nausea or vomiting or headache.   Reports  taking cotempla bc brief mood benefit and sig focus and productivity benefit. Took 1 dose pramipexole and felt more dep.  09/01/22 appt noted: Psych meds:  Clonazepam 0.5 mg tablets 2 nightly, gabapentin dosage varies, Dayvigo 10 mg nightly, trazodone 100 mg nightly,  sertaline 15o mg . Returned to Cotempla AM, pramipexole  0.25 mg BID. Tolerating meds now. Patient was administered Spravato 84  mg intranasally today.  The patient experienced the typical dissociation which gradually resolved over the 2-hour period of observation.  There were no complications.  Specifically the patient did not have nausea or vomiting or headache.   Willing to try the pramipexole and will continue 0.25 mg BID this week and then consider increase if tolerated.  Mood is a little better.  Still looking for further improvement.  09/03/22 appt noted: Psych meds:  Clonazepam 0.5 mg tablets 2 nightly, gabapentin dosage varies, Dayvigo 10 mg nightly, trazodone 100 mg nightly,  sertaline 15o mg . Returned to Cotempla AM, pramipexole  0.25 mg tablet 1 and 1/2 tablets twice daily BID. Tolerating meds now. Patient was administered Spravato 84 mg intranasally today.  The patient experienced the typical dissociation which gradually resolved over the 2-hour period of observation.  There were no complications.  Specifically the patient did not have nausea or vomiting or headache.   Depression may be a little better with the parmipexole and is tolerating it well so far.  Still struggling with focus and residual depression.  Tolerating meds without SE.  More hopeful and able to enjoy some things.  09/08/22 appt  Psych meds:  Clonazepam 0.5 mg tablets 2 nightly, gabapentin dosage varies, Dayvigo 10 mg nightly, trazodone 100 mg nightly,  sertaline 15o mg . Returned to Cotempla AM, pramipexole  0.5 mg BID. Tolerating meds now. Patient was administered Spravato 84 mg intranasally today.  The patient experienced the typical dissociation which  gradually resolved over the 2-hour period of observation.  There were no complications.  Specifically the patient did not have nausea or vomiting or headache.   She has seen her depression drop from a 10/10 prior to treatment with Spravato to now 4/10 With additional benefit noted when she increased the pramipexole to 0.5 mg twice daily.  She is not having side effects with this like she did in the past. Anxiety levels are also improved. She is sleeping okay with the current medications. Plan: continue pramipexole trial off-label and increase more slowly for TRD.  Increase  Gradually  to 0.75 mg BID  09/11/22 appt noted: Psych meds:  Clonazepam 0.5 mg tablets 2 nightly, gabapentin dosage varies, Dayvigo 10 mg nightly, trazodone 100 mg nightly,  sertaline 15o mg . Returned to Cotempla AM, pramipexole  0.75 mg BID. Tolerating meds now. Patient was administered Spravato 84 mg intranasally today.  The patient experienced the typical dissociation which gradually resolved over the 2-hour period of observation.  There were no complications.  Specifically the patient did not have nausea or vomiting or headache.   She has seen her depression drop from a 10/10 prior to treatment with Spravato to now 4/10 With additional benefit noted when she increased the pramipexole to 0.5 mg twice daily.  She is not having side effects with this like she did in the past. Clearly improving with the pramipexole now and tolerating it.  Satisfied with current meds but would consider increasing if it might be helpful.  09/15/22 appt noted: Psych meds:  Clonazepam 0.5 mg tablets 2 nightly, gabapentin dosage varies, Dayvigo 10 mg nightly, trazodone 100 mg nightly,  sertaline 15o mg . Returned to Cotempla AM, pramipexole  0.75 mg BID too anxious and reduced to 0.5 mg BID. Tolerating meds now. Patient was administered Spravato 84 mg intranasally today.  The patient experienced the typical dissociation which gradually resolved over  the 2-hour period of observation.  There were no complications.  Specifically the patient did not  have nausea or vomiting or headache.   Trouble staying asleep longer than 6 hours.  Doesn't feel she can tolerate pramipexole 0.75 mg BID DT anxiety not experienced at  0.5 mg BID.  Otherwise tolerating meds.  Mood is better with Spravato and pramipexole.   Not sleeping as long with meds.  EMA 6 hours.  Wonders about change Disc concerns about waiting for therapy. Feels ready to reduce Spravato to weekly.    09/25/22 appt noted: Psych meds:  Clonazepam 0.5 mg tablets 2 nightly, gabapentin dosage varies, Dayvigo 10 mg nightly, trazodone 100 mg nightly,  sertaline 15o mg . Returned to Cotempla AM, pramipexole  0.75 mg BID too anxious and reduced to 0.5 mg BID. Tolerating meds now. Patient was administered Spravato 84 mg intranasally today.  The patient experienced the typical dissociation which gradually resolved over the 2-hour period of observation.  There were no complications.  Specifically the patient did not have nausea or vomiting or headache.   Trouble staying asleep longer than 6 hours.  Failed to respond to Seroquel 25 mg HS.  Gone back to [facility].  Belsomra NR. Mood ok until the last few days more dep bc it's been 10 days since last admin.  Wants to continue weekly.   No SE.  No med changes desired.  09/29/22 appt noted: Psych meds:  Clonazepam 0.5 mg tablets 2 nightly, gabapentin dosage varies, Dayvigo 10 mg nightly, trazodone 100 mg nightly,  sertaline 15o mg . Returned to Cotempla AM, pramipexole  0.75 mg BID too anxious and reduced to 0.5 mg BID. Tolerating meds now. Patient was administered Spravato 84 mg intranasally today.  The patient experienced the typical dissociation which gradually resolved over the 2-hour period of observation.  There were no complications.  Specifically the patient did not have nausea or vomiting or headache.   Trouble staying asleep longer than 6 hours.  Plan:  Continue sertaline to 200 mg daily.  It has helped anxiety but not depression.   for insomnia, clonazepam 1 mg HS. trazodone doesn't work well but helps some Continue Dayvigo 10 HS.  Failed resp Belsomra 15 and seroquel 25 She wants to continue Cotempla or Concerta bc helps mood early in day and focus  benefit lasts longer.  Have not been able to get stimulant to be consitently effective throughout the day. continue pramipexole trial off-label and reduce back to  TRD 0.5 mg BID Spravato weekly.  10/13/22 appt noted: Not sleeping as well.  Wants to increase Spravato to twice weekly bc feeling more dep close to the next sched date.  Just the day or 2 before.  .  Plan: Trial for off label TRD increase pramipexole 1 mg TID  10/20/22 appt noted: Psych meds:  Clonazepam 0.5 mg tablets 2 nightly, gabapentin dosage varies, Dayvigo 10 mg nightly, trazodone 100 mg nightly,  sertaline 15o mg . Returned to Cotempla AM, pramipexole  1 mg TID. Tolerating meds now. Patient was administered Spravato 84 mg intranasally today.  The patient experienced the typical dissociation which gradually resolved over the 2-hour period of observation.  There were no complications.  Specifically the patient did not have nausea or vomiting or headache.   Trouble staying asleep longer than 6 hours.  More dep this week and asked to get Spravato twice this week. Plan: Trial for off label TRD increase pramipexole 1 mg QID  10/22/22 appt noted:  Psych meds:  Clonazepam 0.5 mg tablets 2 nightly and another one with EMA, gabapentin dosage varies,  Belsomra 20 mg daily,  trazodone 100 mg nightly,  sertaline 15o mg . Returned to Cotempla AM, pramipexole  1 mg QID for 2nd day Tolerating meds now.  Except a little N and dizzy briefly after takes it. Patient was administered Spravato 84 mg intranasally today.  The patient experienced the typical dissociation which gradually resolved over the 2-hour period of observation.  There were no  complications.  Specifically the patient did not have nausea or vomiting or headache. No change in mood yet.   Sleep better with Belsomra 20 as long as takes with other meds.  If insomnia mood is worse. Ongoing some dep but gets partial relief with Spravato but would like better relief .  10/27/22 received Spravato 84  11/03/22 appt noted:  Meds as above except sertraline she cut to 100 mg instead of 200 mg daily She doesn't think sertraline helping and worries over poss SE Off and on Cotempla .  Not affecting BP. Doesn't notice benefit or SE with pramipexole 1 mg QID.  No SE Sleep is OK.  Some awakening aftter 6 hours but not enough so takes another clonazepam.   Patient was administered Spravato 84 mg intranasally today.  The patient experienced the typical dissociation which gradually resolved over the 2-hour period of observation.  There were no complications.  Specifically the patient did not have nausea or vomiting or headache. No change in mood yet.   Sleep better with Belsomra 20 as long as takes with other meds.  If insomnia mood is worse. Ongoing some dep but gets partial relief with Spravato but would like better relief .  No sig benefit with pramipexole 1 mg QID.  11/21/22 TC:  To ER with dep 11/21/22. No SE with selegiline but feels more dep.  Wants to stop it. Today is only day 2 of 1 tablet of selegiline 5 mg AM.  Reviewed with her that she had not been satisfied with the response of Spravato and Zoloft and that she is likely feeling worse because the Zoloft is wearing off and in particular because she abruptly stopped 150 mg daily.  She needs to give the selegiline time to work.  She agrees to do so.  She admitted to some passive suicidal thoughts but no active suicidal thoughts and no desire to die.  She agrees to the plan to increase selegiline to 10 mg every morning this weekend and to continue the selegiline trial.  She cannot stop this and immediately start another AD due to risk  DDI with serotonin syndrome.  She is aware.  Meredith Staggers, MD, DFAPA  12/19/22 TC: Pt called at 1:42p.  She said she wanted to know if she could start weaning off the Selegiline.  She thinks increasing the dosage is making things worse.  She said she wants to go back to [employer].  She acknowleged she has an appt on Monday. I advised her that I wasn't sure Dr Jennelle Human will see her message before her appt, but she still ask me to send the message.  Next appt 8/26 MD resp.  No change until Monday appt.  12/22/22 appt noted: Meds: selegiline up to 5 mg 4 daily for a week but felt it incr irritability and went back to 3 mg daily. She wants to go back to Spravato.  Not near as dep as now.  Selegiline didn't help energy. Still on Belsomra Thinks maybe sertraline helped mood more than she thought. More dep than anxiety.   Plan: DC selegiline  and will retry Effexor which helped in the past at 300 mg daily.  Start this Friday after over 10 half lives of selegiline.    12/24/22 appt noted: Stopped selegiline  Psych med: belsomra 20, clonazepam 1 mg HS.   She has felt more dep off the Spravato though at the time didn't think it was helping.  Has felt more down and withdrawn.  Low energy, motivation, not smiling.  No SI.  Sleep ok.   Patient was administered Spravato 84 mg intranasally today.  The patient experienced the typical dissociation which gradually resolved over the 2-hour period of observation.  There were no complications.  Specifically the patient did not have nausea or vomiting or headache. No problems with BP or other unusual SE Mood is a little better after this administration of Spravato.  Very motivated to continue it.  No immediate med concerns. No SI .  Ongoing anhedonia.  12/25/22 appt noted: Psych med: belsomra 20, clonazepam 1 mg HS.  Not resumed stimulant or AD yet   No SE. Patient was administered Spravato 84 mg intranasally today.  The patient experienced the typical dissociation  which gradually resolved over the 2-hour period of observation.  There were no complications.  Specifically the patient did not have nausea or vomiting or headache.  There is a lot things going on in her life and it is somewhat difficult to separate stressors from what is going on with her mood.  However she is happy to resume Spravato as she decided after stopping briefly that it was helpful.  She plans to start the antidepressant Morrow.  She also wants to resume the stimulant tomorrow.  12/30/22 appt noted; Psych med: belsomra 20, clonazepam 1 mg HS.  Not resumed stimulant.  Effexor XR 37.5 mg daily   No SE. Patient was administered Spravato 84 mg intranasally today.  The patient experienced the typical dissociation which gradually resolved over the 2-hour period of observation.  There were no complications.  Specifically the patient did not have nausea or vomiting or headache.  She is still having depression with very prominent anhedonia.  However she feels the Spravato is somewhat helpful and is hopeful for more complete response as she gets back onto a reasonable antidepressant dose.  She tolerated Effexor XR 300 mg daily in the past and took it for 5 months and it is probably the antidepressants worked best for her.  She is agreeable with this plan.  She is getting sleep benefit from the current medications Plan: incr Effexor 75 mg daily  01/02/23 appt noted: Psych meds as noted above except increase Effexor XR to 75 mg daily without side effects. Patient was administered Spravato 84 mg intranasally today.  The patient experienced the typical dissociation which gradually resolved over the 2-hour period of observation.  There were no complications.  Specifically the patient did not have nausea or vomiting or headache.  Agrees to increasing Effexor as planned.  SX not changed from that noted above.  No SI  01/05/23 appt noted: Psych meds as noted above except increase Effexor XR to 112.5 mg daily  without side effects. Patient was administered Spravato 84 mg intranasally today.  The patient experienced the typical dissociation which gradually resolved over the 2-hour period of observation.  There were no complications.  Specifically the patient did not have nausea or vomiting or headache.  Agrees to increasing Effexor as planned gradually to 300 mg daily.  SX not changed from that noted above.  Does however  feel Spravato has dampened the degree of depression.  No SI Stressful life event discussed and won't be resolved until early next year.  01/07/23 appt noted: Psych meds Effexor XR 112.5 mg daily. Patient was administered Spravato 84 mg intranasally today.  The patient experienced the typical dissociation which gradually resolved over the 2-hour period of observation.  There were no complications.  Specifically the patient did not have nausea or vomiting or headache.  Agrees to increasing Effexor as planned gradually to 300 mg daily.  SX not changed from that noted above.  Does however feel Spravato has dampened the degree of depression markedly compared to when off psych meds and no Spravato.  No SI  AIMS    Flowsheet Row Video Visit from 01/03/2022 in [facility]  AIMS Total Score 0      ECT-MADRS    Flowsheet Row Clinical Support from 08/13/2022 in [facility] Video Visit from 04/29/2022 in [facility] Video Visit from 02/06/2022 in [facility]  MADRS Total Score 27 44 23      GAD-7    Flowsheet Row Counselor from 12/19/2022 in [facility] Office Visit from 08/22/2022 in [facility] [place]  Total GAD-7 Score 4 7      PHQ2-9    Flowsheet Row Counselor from 12/19/2022 in [facility] Office Visit from 08/22/2022 in [facility] [place] Office Visit from 08/19/2022  in [facility] Healthcare at [facility] Video Visit from 02/06/2022 in [facility] Office Visit from 11/16/2020 in [facility] at [facility] Total Score 2 3 0 6 0  PHQ-9 Total Score 5 5 -- 14 --        Past Psychiatric Medication Trials: Trintellix - Initially effective and then was not effective when re-started Paxil- Ineffective. Helped initially. Prozac-Insomnia Lexapro- Effective and then no longer as effective Zoloft- Initially effective and then minimal response Viibryd Cymbalta- Ineffective. Helped initially. Effexor XR 300- Effective, well tolerated. Pristiq- Increased anxiety at 150 mg daily Wellbutrin XL-Ineffective.May have increased anxiety. Amitriptyline Nortriptyline-Caused irritability, constipation Auvelity- ineffective Selegilne brief 20 mg daily , more irritable  Abilify- Helpful but caused severe insomnia. Rexulti-Helpful but caused insomnia. Vraylar Seroquel - Ineffective Risperdal Olanzapine- Ineffective Latuda- Ineffective Caplyta  Klonopin- Effective Temazepam- Took during menopause Lunesta- Ineffective Ambien-Ineffective Sonata- Ineffective Dayvigo- partially effective Failed resp Belsomra 15 and seroquel 25 Trazodone-  somewhat effective Remeron-Ineffective. Does not recall wt gain.  Quetiapine 25 NR  Adderall- Took once and had adverse effect. Concerta- Effective but only 4-6 hours Jornay 80 NR Metadate-Not as effective as Concerta Dexmethylphenidate- Not as effective as Concerta Azstarys- some side effects. Not as effective.  Ritalin short acting and SE anxiety & HTN @20  TID Cotemplay 17.3  Daytrana  Lamictal- May have had an adverse effects. "Felt weird." Reports worsening s/s.  Lithium ? effect Gabapentin Pramipexole- 1 mg QID NR  ECT #20 with minimal response. H says it was partly helpful.  AIMS    Flowsheet Row Video Visit from  01/03/2022 in [facility]  AIMS Total Score 0      ECT-MADRS    Flowsheet Row Clinical Support from 08/13/2022 in [facility] Video Visit from 04/29/2022 in [facility] Video Visit from 02/06/2022 in [facility]  MADRS Total Score 27 44 23      GAD-7    Flowsheet Row Counselor from 12/19/2022 in [facility] Office Visit from 08/22/2022 in [facility] [place]  Total GAD-7 Score 4 7      PHQ2-9    Flowsheet Row Counselor from 12/19/2022 in [facility] Office Visit from 08/22/2022 in [facility] [place] Office Visit from 08/19/2022 in [facility] at [facility] Video Visit from 02/06/2022 in [facility] Office Visit from 11/16/2020 in [facility] for [facility] Healthcare at [facility]  PHQ-2 Total Score 2 3 0 6 0  PHQ-9 Total Score 5 5 -- 14 --        Review of Systems:  Review of Systems  Cardiovascular:  Negative for palpitations.  Neurological:  Negative for dizziness, tremors and weakness.  Psychiatric/Behavioral:  Positive for dysphoric mood and sleep disturbance. Negative for agitation, decreased concentration and suicidal ideas. The patient is nervous/anxious.     Medications: I have reviewed the patient's current medications.  Current Outpatient Medications  Medication Sig Dispense Refill   clonazePAM (KLONOPIN) 0.5 MG tablet 2 tablets at night and 1 tablet as needed daily for panic anxiety (Patient taking differently: Take 1 mg by mouth at bedtime.) 75 tablet 1   Esketamine HCl, 84 MG Dose, (SPRAVATO, 84 MG DOSE,) 28 MG/DEVICE SOPK Place 84 mg into the nose every 3 (three) days. 3 each 5   Estradiol (VAGIFEM) 10 MCG TABS vaginal tablet Place 1 tablet (10 mcg total) vaginally 2 (two)  times a week. 24 tablet 3   Suvorexant (BELSOMRA) 20 MG TABS TAKE 1 TABLET BY MOUTH AT BEDTIME 30 tablet 2   valACYclovir (VALTREX) 500 MG tablet Take one twice daily for 3 days with any symptoms 30 tablet 3   valsartan (DIOVAN) 80 MG tablet Take 1 tablet (80 mg total) by mouth daily. 90 tablet 3   venlafaxine XR (EFFEXOR-XR) 75 MG 24 hr capsule Take 3 capsules (225 mg total) by mouth daily. (Patient taking differently: Take 150 mg by mouth daily.) 270 capsule 0   No current facility-administered medications for this visit.    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   TRANSAMINASES, SERUM, ELEVATED 08/14/2009    Past Medical History, Surgical history, Social history, and Family history were reviewed and updated as appropriate.   Please see review of systems for further details on the patient's review from today.   Objective:   Physical Exam:  LMP 12/27/2007 (Exact Date)   Physical Exam Constitutional:      General: She is not in acute distress. Musculoskeletal:        General: No deformity.  Neurological:     Mental Status: She is alert and oriented to person, place, and time.     Coordination: Coordination normal.  Psychiatric:        Attention and Perception: Attention and perception normal. She does not perceive auditory hallucinations.        Mood  and Affect: Mood is anxious and depressed. Affect is blunt.        Speech: Speech normal. Speech is not slurred.        Behavior: Behavior normal.        Thought Content: Thought content normal. Thought content is not delusional. Thought content does not include homicidal or  suicidal ideation. Thought content does not include suicidal plan.        Cognition and Memory: Cognition and memory normal.        Judgment: Judgment normal.     Comments: Insight intact Mood still mod dep but better Affect blunted without change     Lab Review:     Component Value Date/Time   NA 132 (L) 10/27/2022 1107   K 4.4 10/27/2022 1107   CL 95 (L) 10/27/2022 1107   CO2 30 10/27/2022 1107   GLUCOSE 89 10/27/2022 1107   BUN 14 10/27/2022 1107   CREATININE 0.67 10/27/2022 1107   CREATININE 0.68 01/26/2020 0920   CALCIUM 10.1 10/27/2022 1107   PROT 8.3 04/22/2022 0932   ALBUMIN 5.3 (H) 04/22/2022 0932   AST 82 (H) 04/22/2022 0932   ALT 118 (H) 04/22/2022 0932   ALKPHOS 74 04/22/2022 0932   BILITOT 0.3 04/22/2022 0932   GFRNONAA >90 05/01/2014 1645   GFRAA >90 05/01/2014 1645       Component Value Date/Time   WBC 3.3 (L) 04/22/2022 0932   RBC 4.25 04/22/2022 0932   HGB 14.3 04/22/2022 0932   HGB 14.1 10/26/2014 1034   HCT 40.7 04/22/2022 0932   PLT 245.0 04/22/2022 0932   MCV 95.7 04/22/2022 0932   MCH 34.4 (H) 01/26/2020 0920   MCHC 35.1 04/22/2022 0932   RDW 13.2 04/22/2022 0932   LYMPHSABS 0.9 04/22/2022 0932   MONOABS 0.2 04/22/2022 0932   EOSABS 0.1 04/22/2022 0932   BASOSABS 0.0 04/22/2022 0932    No results found for: "POCLITH", "LITHIUM"   No results found for: "PHENYTOIN", "PHENOBARB", "VALPROATE", "CBMZ"   .res Assessment: Plan:    Heather Davila was seen today for follow-up, depression, anxiety and stress.  Diagnoses and all orders for this visit:  Recurrent major depression resistant to treatment [facility])  Generalized anxiety disorder  Attention deficit hyperactivity disorder (ADHD), predominantly inattentive type  Insomnia, unspecified type  Stressful life event affecting family     Pt seen for TRD with multiple failed medications as noted above.  Some of these failures have been related to an adequate dose and duration as she has had a  tendency to get frustrated with medication trials.TRD need 6 week trial of AD at full dose at least.   She has had no problems with the transition which has been started from selegiline to Effexor XR.  No SE thus far.  Patient was administered Spravato 84 mg intranasally today.  The patient experienced the typical dissociation which gradually resolved over the 2-hour period of observation.  There were no complications.  Specifically the patient did not have nausea or vomiting or headache.  Blood pressures remained within normal ranges at the 40-minute and 2-hour follow-up intervals.  By the time the 2-hour observation period was met the patient was alert and oriented and able to exit without assistance.  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  See nursing note for further details.  Consider switch Spravato to MAOI, Parnate  We discussed the short-term risks associated with benzodiazepines including sedation  and increased fall risk among others.  Discussed long-term side effect risk including dependence, potential withdrawal symptoms, and the potential eventual dose-related risk of dementia.  But recent studies from 2020 dispute this association between benzodiazepines and dementia risk. Newer studies in 2020 do not support an association with dementia.  for insomnia, clonazepam 1 mg HS.  Some better so far with Belsomra 20 mg HS  She wants to resume Cotempla or Concerta bc helps mood early in day and focus  benefit lasts longer.  Have not been able to get stimulant to be consitently effective throughout the day.  increasing venlafaxine XR to 150 mg tomorrow.  She will increase every 3 to 4 days or as tolerated to try to get to XR 300 mg daily as soon as possible.  This was a dose that she took and tolerated in the past.  No other med change today  Supportive therapy dealing with serious stressful life  event.  FU weekly Spravato .  Meredith Staggers, MD, DFAPA    Please see After Visit Summary for patient specific instructions.  Future Appointments  Date Time Provider Department Center  01/12/2023 10:00 AM Cottle, Steva Ready., MD [facility] None  01/12/2023 10:00 AM CP-NURSE [facility] None  01/15/2023  1:00 PM Cottle, Steva Ready., MD [facility] None  01/15/2023  1:00 PM CP-NURSE CP-CP None  01/29/2023 11:00 AM Gaspar Bidding, [facility] CP-CP None  02/09/2023  1:00 PM Gaspar Bidding, [facility] CP-CP None  02/16/2023  1:00 PM Gaspar Bidding, [facility] CP-CP None  02/23/2023  1:00 PM Gaspar Bidding, [facility] CP-CP None  03/02/2023  2:00 PM Gaspar Bidding, [facility] CP-CP None  09/18/2023  8:15 AM Jerene Bears, MD [facility]    No orders of the defined types were placed in this encounter.   -------------------------------

## 2023-01-09 NOTE — Progress Notes (Signed)
NURSES NOTE:           Patient arrived for her #48 Spravato treatment. Pt is coming twice a week currently. Pt is being treated for Treatment Resistant Depression, pt will be receiving 84 mg (3 of the 28 mg) nasal sprays, this is her maintenance dose. Patient taken to treatment room, I explained and discussed her treatment and how the schedule should go, along with any side effects that may occur. Answered any questions and concerns the patient had. Pt's Spravato is delivered through [place] and she is now billing with her insurance. Spravato medication is stored at doctors office per REMS/FDA guidelines. The medication is required to be locked behind two doors per FDA/REMS Protocol. Medication is also disposed of properly per regulations. All treatments and her vital signs are documented in Spravato REMS per protocol of treatment center regulations. [facility] was notified pt is restarting, and will need her Spravato delivered again on a regular basis. No new PA is needed at this time.      Vital Signs assessed at 3:12 PM 130/77, pulse 64, pulse ox 92%. Instructed pt to blow her nose and recline back slightly to prevent any of medication dripping out of her nose.   Pt. Given 1st dose (28 mg inhaler), then 5 minutes between 2nd dose and 3rd dose for total of 84 mg. No complaints of nausea/vomiting reported. Pt did listen to music today, she just reclined back and closed her eyes. After 40 minutes I went to assess her vital signs, no side effects or symptoms reported, B/P 145/80, pulse 61. Dr. Jennelle Human talked to pt today about her treatment. Nurse was with pt a total of 60 minutes but pt observed for 120 minutes per protocol. Discharge vital signs were at 5:10 PM, B/P 127/82, pulse 69. She verbalized understanding. She reports dissociation but she was clear upon her discharge. She is scheduled on Wednesday, September 11th.  She is instructed to contact office if needing anything prior to her next treatment.      LOT # [id number]   EXP APR 2027

## 2023-01-12 ENCOUNTER — Encounter: Payer: Self-pay | Admitting: Psychiatry

## 2023-01-12 ENCOUNTER — Ambulatory Visit (INDEPENDENT_AMBULATORY_CARE_PROVIDER_SITE_OTHER): Payer: 59 | Admitting: Psychiatry

## 2023-01-12 ENCOUNTER — Ambulatory Visit: Payer: 59

## 2023-01-12 VITALS — BP 120/68 | HR 71

## 2023-01-12 DIAGNOSIS — F411 Generalized anxiety disorder: Secondary | ICD-10-CM | POA: Diagnosis not present

## 2023-01-12 DIAGNOSIS — G4700 Insomnia, unspecified: Secondary | ICD-10-CM

## 2023-01-12 DIAGNOSIS — F339 Major depressive disorder, recurrent, unspecified: Secondary | ICD-10-CM

## 2023-01-12 DIAGNOSIS — F900 Attention-deficit hyperactivity disorder, predominantly inattentive type: Secondary | ICD-10-CM | POA: Diagnosis not present

## 2023-01-12 DIAGNOSIS — Z6379 Other stressful life events affecting family and household: Secondary | ICD-10-CM

## 2023-01-12 NOTE — Progress Notes (Signed)
NURSES NOTE:           Patient arrived for her #50 Spravato treatment. Pt is coming twice a week currently after a change in medication for 1 month. Pt is being treated for Treatment Resistant Depression, pt will be receiving 84 mg (3 of the 28 mg) nasal sprays, this is her maintenance dose. Patient taken to treatment room, I explained and discussed her treatment and how the schedule should go, along with any side effects that may occur. Answered any questions and concerns the patient had. Pt's Spravato is delivered through [place] and she is now billing with her insurance. Spravato medication is stored at doctors office per REMS/FDA guidelines. The medication is required to be locked behind two doors per FDA/REMS Protocol. Medication is also disposed of properly per regulations. All treatments and her vital signs are documented in Spravato REMS per protocol of treatment center regulations. [facility] was notified pt is restarting, and will need her Spravato delivered again on a regular basis. No new PA is needed at this time.      Vital Signs assessed at 10:05 AM 156/89, pulse 61, pulse ox 92%, 152/91, 64 pulse. Instructed pt to blow her nose and recline back slightly to prevent any of medication dripping out of her nose.   Pt. Given 1st dose (28 mg inhaler), then 5 minutes between 2nd dose and 3rd dose for total of 84 mg. No complaints of nausea/vomiting reported. Pt did listen to music today, she just reclined back and closed her eyes. After 40 minutes I went to assess her vital signs, no side effects or symptoms reported, B/P 129/71, pulse 67. Dr. Jennelle Human talked to pt today about her treatment. Nurse was with pt a total of 60 minutes but pt observed for 120 minutes per protocol. Discharge vital signs were at 11:55 AM, B/P 120/68, pulse 71. She verbalized understanding. She reports dissociation but she was clear upon her discharge. She is scheduled later this week on Thursday, September 19th.  She is  instructed to contact office if needing anything prior to her next treatment.     LOT # [id number]   EXP APR 2027

## 2023-01-12 NOTE — Progress Notes (Signed)
Heather Davila [id number] 1961/02/28 62 y.o.  Subjective:   Patient ID:  Heather Davila is a 62 y.o. (DOB [birth year]) female.  Chief Complaint:  Chief Complaint  Patient presents with   Follow-up   Depression   Anxiety   ADD   Stress    HPI Heather Davila presents to the office today for follow-up of treatment resistant recurrent depression, generalized anxiety disorder and insomnia.  Almost too numerous to count med failures. She is here for Spravato administration for her treatment resistant depression.  05/21/22 appt noted: Patient was administered first dose Spravato 56 mg intranasally today.  The patient experienced the typical dissociation which gradually resolved over the 2-hour period of observation.  There were no complications.  Specifically the patient did not have nausea or vomiting or headache.   Dissociation was mild. Did not experience any emotional relief from disabling depression.wants to increase Spravato to the usual dose.  She is aware insurance is not covering the costs at this time.  No SI acutely. Tolerating meds.    05/23/22 appt noted: Patient was administered Spravato 84 mg intranasally today.  The patient experienced the typical dissociation which gradually resolved over the 2-hour period of observation.  There were no complications.  Specifically the patient did not have nausea or vomiting.   Dissociation was greater than last dose and a little bothersome DT some HA. Tolerating meds.   Mood has not changed significantly thus far with Spravato.  05/27/22 appt noted: Patient was administered Spravato 84 mg intranasally today.  The patient experienced the typical dissociation which gradually resolved over the 2-hour period of observation.  There were no complications.  Specifically the patient did not have nausea or vomiting or headache. Today she has felt a little relief from the pressing heaviness of depression but a little more anxiety.  During administration  of Spravato she listens to [place] music and was able to relax a little more with the feeling of dissociation that was mildly bothersome. Husband was also in on the session today and asked some questions about IV ketamine versus nasal spray ketamine.  05/29/22 appt noted: Cancelled today DT hypertension  05/30/22 appt noted: Patient was administered Spravato 84 mg intranasally today.  The patient experienced the typical dissociation which gradually resolved over the 2-hour period of observation.  There were no complications.  Specifically the patient did not have nausea or vomiting or headache. She is seeing some improvement in mood with less continuous depression and less intensity.  Hopefulness is better.   No med complaints or SE  06/05/22 appt noted: Patient was administered Spravato 84 mg intranasally today.  The patient experienced the typical dissociation which gradually resolved over the 2-hour period of observation.  There were no complications.  Specifically the patient did not have nausea or vomiting or headache.  Specifically HA is less of a problem with Spravato. No SE with meds. Depression is improving with less intensity.  Intermittent anxiety easily.  More able to enjoy things.  No new concerns.  06/17/22 appt noted: Patient was administered Spravato 84 mg intranasally today.  The patient experienced the typical dissociation which gradually resolved over the 2-hour period of observation.  There were no complications.  Specifically the patient did not have nausea or vomiting or headache.  Specifically HA is less of a problem with Spravato. No SE with meds. Depression is improved 45% with less intensity.  Intermittent anxiety less easily.  More able to enjoy things.  No new concerns.  More active.  Current meds: increased clonazepam to about 1.5 mg HS DT recent insomnia, lexapro 20, Dayvigo 10 mg HS, concerta 36 mg AM, trazodone 100 mg HS.  06/20/22 appt noted: Current meds: increased  clonazepam to about 1.5 mg HS DT recent insomnia, lexapro 20, Dayvigo 10 mg HS, concerta 36 mg AM, trazodone 100 mg HS. Patient was administered Spravato 84 mg intranasally today.  The patient experienced the typical dissociation which gradually resolved over the 2-hour period of observation.  There were no complications.  Specifically the patient did not have nausea or vomiting or headache.  Specifically HA is less of a problem with Spravato. No SE with meds. Depression is improved 45% with less intensity.  Intermittent anxiety less easily.  More able to enjoy things.  No new concerns.  More active.  06/23/22 appt noted:  Current meds: increased clonazepam to about 1.5 mg HS DT recent insomnia, lexapro 20, Dayvigo 10 mg HS, concerta 36 mg AM, trazodone 100 mg HS. Patient was administered Spravato 84 mg intranasally today.  The patient experienced the typical dissociation which gradually resolved over the 2-hour period of observation.  There were no complications.  Specifically the patient did not have nausea or vomiting or headache.  Specifically HA is less of a problem with Spravato. No SE with meds. Intensity of Spravato dissociation varies.  Last week very intesne and this week more typical.  Depression is continuing to improve with better interest and activity and motivation.  Gradual improvement.  Wants to continue.  06/25/22 appt noted: Current meds: increased clonazepam to about 1.5 mg HS DT recent insomnia, lexapro 20, Dayvigo 10 mg HS, concerta 36 mg AM, trazodone 100 mg HS. Patient was administered Spravato 84 mg intranasally today.  The patient experienced the typical dissociation which gradually resolved over the 2-hour period of observation.  There were no complications.  Specifically the patient did not have nausea or vomiting or headache.  Specifically HA is less of a problem with Spravato. No SE with meds. Intensity of Spravato dissociation varies.  No scary and well  tolerated. Continues to gradually improve with Spravato.  She is more motivated and enjoying things more.  H sees progress.  Wants to continue twice weekly until gets maximum response.  Dep is not gone yet.  06/30/22 appt noted:  seen with H Current meds: increased clonazepam to about 1.5 mg HS DT recent insomnia, lexapro 20, Dayvigo 10 mg HS, concerta 36 mg AM, trazodone 100 mg HS. Patient was administered Spravato 84 mg intranasally today.  The patient experienced the typical dissociation which gradually resolved over the 2-hour period of observation.  There were no complications.  Specifically the patient did not have nausea or vomiting or headache.  Specifically HA is less of a problem with Spravato. No SE with meds. Intensity of Spravato dissociation varies.  No scary and well tolerated. Continues to gradually improve with Spravato.  She is more motivated and enjoying things more.  H sees even more progress than she does; more active and smiling.  Wants to continue twice weekly until gets maximum response.  Dep is 80% better.  07/07/22 appt noted: Current meds: increased clonazepam to about 1.5 mg HS DT recent insomnia, lexapro 20, Dayvigo 10 mg HS, concerta 36 mg AM, trazodone 100 mg HS. Patient was administered Spravato 84 mg intranasally today.  The patient experienced the typical dissociation which gradually resolved over the 2-hour period of observation.  There were no complications.  Specifically the patient did not have nausea  or vomiting or headache.  Specifically HA is less of a problem with Spravato. No SE with meds. Intensity of Spravato dissociation varies.  No scary and well tolerated.  Was more intese today. Overall mood is markedly better and please with meds. No changes desired.  07/09/22 appt noted: In transition from Lexapro to sertraline and missing Concerta bc CO crash from it after about 4 hours.  Disc these concerns.  She'll discuss also with Corie Chiquito, NP More dep the  last couple of days and concerned about reducing Spravato frequency while transition from one antidep to another.  Affect less positive. Patient was administered Spravato 84 mg intranasally today.  The patient experienced the typical dissociation which gradually resolved over the 2-hour period of observation.  There were no complications.  Specifically the patient did not have nausea or vomiting or headache.  Specifically HA is less of a problem with Spravato. No SE with meds. Intensity of Spravato dissociation varies.  No scary and well tolerated.   07/14/22 appt noted: Has been more depressed this week.  More trouble with sleep.  Taking clonazepam 1-1.5  of the 0.5 mg tablets.   Trazodone not helping much. She switched back from 200 sertraline to Lexapro 20.  No SE More trouble with motivation.  Some stress with son with special needs. Misses the benevit of Concerta but it was too short acting and crashed in 4-6 hours. Patient was administered Spravato 84 mg intranasally today.  The patient experienced the typical dissociation which gradually resolved over the 2-hour period of observation.  There were no complications.  Specifically the patient did not have nausea or vomiting or headache.  Specifically HA is less of a problem with Spravato. No SE with meds. Intensity of Spravato dissociation varies.  No scary and well tolerated.   07/17/22 appt noted: Patient was administered Spravato 84 mg intranasally today.  The patient experienced the typical dissociation which gradually resolved over the 2-hour period of observation.  There were no complications.  Specifically the patient did not have nausea or vomiting or headache.  Specifically HA is less of a problem with Spravato. No SE with meds. Intensity of Spravato dissociation varies.  Not scary and well tolerated.  Just got Jornay last night but wasn't sure how to take it.  Wants to start and this was discussed.  She felt MPH helped mood and attn but  didn't last long enough and had crash after a few hours on Concerta.  Better for 2 hours after each dose Ritalin 20 mg with mood but then it wore off.  BP up after 3rd dose 160/90 and then took H's amlodipine 5 and BP became normal.  07/22/22 appt noted: Patient was administered Spravato 84 mg intranasally today.  The patient experienced the typical dissociation which gradually resolved over the 2-hour period of observation.  There were no complications.  Specifically the patient did not have nausea or vomiting or headache.  Specifically HA is less of a problem with Spravato. No SE with meds. Intensity of Spravato dissociation varies.  Not scary and well tolerated.  Start Jornay 20 mg over the weekend and did not notice any particular effect good or bad.  Increased to 40 mg. Other psych meds: Clonazepam 0.5 mg tablets 2 nightly, gabapentin dosage varies, Dayvigo 10 mg nightly, Lexapro 20 mg daily, to trazodone 100 mg nightly  07/24/22 appt noted: Patient was administered Spravato 84 mg intranasally today.  The patient experienced the typical dissociation which gradually resolved over  the 2-hour period of observation.  There were no complications.  Specifically the patient did not have nausea or vomiting or headache.  Specifically HA is less of a problem with Spravato. No SE with meds. Intensity of Spravato dissociation varies.  Not scary and well tolerated.  Other psych meds: Clonazepam 0.5 mg tablets 2 nightly, gabapentin dosage varies, Dayvigo 10 mg nightly, Lexapro 20 mg daily, to trazodone 100 mg nightly, Jornay 40 mg PM Doing well with Spravato.  Noticed a little effect from the Jornay 40 without SE but would like to increase the dose for ADD and mood.  Sleeping well.  No new concerns.  Still more depressed than she was with th initial benefit of Spravato.  07/30/22 note:  Patient was administered Spravato 84 mg intranasally today.  The patient experienced the typical dissociation which gradually  resolved over the 2-hour period of observation.  There were no complications.  Specifically the patient did not have nausea or vomiting or headache.  Specifically HA is less of a problem with Spravato. Other psych meds: Clonazepam 0.5 mg tablets 2 nightly, gabapentin dosage varies, Dayvigo 10 mg nightly, Lexapro 20 mg daily, to trazodone 100 mg nightly, Jornay 60 mg PM Wants to increase Jornay to 80 mg PM to increase benefit for ADD and depression. No SE She has experienced a major family crisis that threatens to change her daily life from now on.  It has not triggered suicidal thoughts at this time but she is very afraid.  No desire to change medications.  08/04/22 appt noted" Patient was administered Spravato 84 mg intranasally today.  The patient experienced the typical dissociation which gradually resolved over the 2-hour period of observation.  There were no complications.  Specifically the patient did not have nausea or vomiting or headache.  Specifically HA is less of a problem with Spravato. Other psych meds: Clonazepam 0.5 mg tablets 2 nightly, gabapentin dosage varies, Dayvigo 10 mg nightly, Lexapro 20 mg daily, trazodone 100 mg nightly, Jornay 80 mg PM No SE No benefit or SE with increase Jormay 80 mg.  Says Concerta helped ADD and depression but not Jornay.  However duration was insufficient.  Still depressed and wants change to another form of stimulant.  No concerns with other meds. Continues with strong family stressors creating uncertainty about her future but no quick resolution. No SI Trial Cotempla 17.3 and DC Ritalin & Jornay 80  08/11/2022 appointment noted: Patient was administered Spravato 84 mg intranasally today.  The patient experienced the typical dissociation which gradually resolved over the 2-hour period of observation.  There were no complications.  Specifically the patient did not have nausea or vomiting or headache.  Specifically HA is less of a problem with Spravato now  vs when started it.. Other psych meds: Clonazepam 0.5 mg tablets 2 nightly, gabapentin dosage varies, Dayvigo 10 mg nightly, trazodone 100 mg nightly, cotempla 17.3, switch from Lexapro 20 to sertaline 15o mg . No noticeable effects sertraline from for depression but has seen some antianxiety effects from the sertraline.  No side effects noted.  No side effects with the other medicines.  Still is only getting very brief benefit 3 to 4 hours from Cotempla for ADD and mood.  She wants to try the Daytrana patch as we have discussed before. No SE  08/13/22 appt: Patient was administered Spravato 84 mg intranasally today.  The patient experienced the typical dissociation which gradually resolved over the 2-hour period of observation.  There were no complications.  Specifically the patient did not have nausea or vomiting or headache.  Specifically HA is less of a problem with Spravato now vs when started it.. Other psych meds: Clonazepam 0.5 mg tablets 2 nightly, gabapentin dosage varies, Dayvigo 10 mg nightly, trazodone 100 mg nightly, cotempla 17.3, switch from Lexapro 20 to sertaline 15o mg . No noticeable effects sertraline from for depression but has seen some antianxiety effects from the sertraline.  No side effects noted.  No side effects with the other medicines.  Still is only getting very brief benefit 3 to 4 hours from Cotempla for ADD and mood.  She wants to try the Daytrana patch as we have discussed before.  Hasn't been able to get it yet. No SE Plan.  Continue to try to get Daytrana 30 AM  08/18/22 appt noted: Patient was administered Spravato 84 mg intranasally today.  The patient experienced the typical dissociation which gradually resolved over the 2-hour period of observation.  There were no complications.  Specifically the patient did not have nausea or vomiting or headache.  Specifically HA is less of a problem with Spravato now vs when started it.. Other psych meds: Clonazepam 0.5 mg  tablets 2 nightly, gabapentin dosage varies, Dayvigo 10 mg nightly, trazodone 100 mg nightly, cotempla 17.3, switch from Lexapro 20 to sertaline 15o mg . No noticeable effects sertraline from for depression but has seen some antianxiety effects from the sertraline.  No side effects noted.  No side effects with the other medicines.  Still is only getting very brief benefit 3 to 4 hours from Cotempla for ADD and mood.  She wants to try the Daytrana patch as we have discussed before.  Hasn't been able to get it yet.  Overall depression is some better than it was.   No SE Plan.  Continue to try to get Daytrana 30 AM  08/25/22 appt : No benefit Daytrana.  No SE.  Wants further med changes and interested In retrying pramipexole.   Tolerating meds. Patient was administered Spravato 84 mg intranasally today.  The patient experienced the typical dissociation which gradually resolved over the 2-hour period of observation.  There were no complications.  Specifically the patient did not have nausea or vomiting or headache.  Specifically HA is less of a problem with Spravato now vs when started it.. Other psych meds: Clonazepam 0.5 mg tablets 2 nightly, gabapentin dosage varies, Dayvigo 10 mg nightly, trazodone 100 mg nightly, , switch from Lexapro 20 to sertaline 15o mg . Daytrana 30 AM for a few days. Struggles with depression and no SI.  Reduced interest and mood and motivation and enjoyment and socialization.  08/27/22 appt noted: Psych meds:  Clonazepam 0.5 mg tablets 2 nightly, gabapentin dosage varies, Dayvigo 10 mg nightly, trazodone 100 mg nightly,  sertaline 15o mg . Returned to [employer] AM, pramipexole  Tolerating meds. Patient was administered Spravato 84 mg intranasally today.  The patient experienced the typical dissociation which gradually resolved over the 2-hour period of observation.  There were no complications.  Specifically the patient did not have nausea or vomiting or headache.   Reports  taking cotempla bc brief mood benefit and sig focus and productivity benefit. Took 1 dose pramipexole and felt more dep.  09/01/22 appt noted: Psych meds:  Clonazepam 0.5 mg tablets 2 nightly, gabapentin dosage varies, Dayvigo 10 mg nightly, trazodone 100 mg nightly,  sertaline 15o mg . Returned to Cotempla AM, pramipexole  0.25 mg BID. Tolerating meds now. Patient was administered  Spravato 84 mg intranasally today.  The patient experienced the typical dissociation which gradually resolved over the 2-hour period of observation.  There were no complications.  Specifically the patient did not have nausea or vomiting or headache.   Willing to try the pramipexole and will continue 0.25 mg BID this week and then consider increase if tolerated.  Mood is a little better.  Still looking for further improvement.  09/03/22 appt noted: Psych meds:  Clonazepam 0.5 mg tablets 2 nightly, gabapentin dosage varies, Dayvigo 10 mg nightly, trazodone 100 mg nightly,  sertaline 15o mg . Returned to Cotempla AM, pramipexole  0.25 mg tablet 1 and 1/2 tablets twice daily BID. Tolerating meds now. Patient was administered Spravato 84 mg intranasally today.  The patient experienced the typical dissociation which gradually resolved over the 2-hour period of observation.  There were no complications.  Specifically the patient did not have nausea or vomiting or headache.   Depression may be a little better with the parmipexole and is tolerating it well so far.  Still struggling with focus and residual depression.  Tolerating meds without SE.  More hopeful and able to enjoy some things.  09/08/22 appt  Psych meds:  Clonazepam 0.5 mg tablets 2 nightly, gabapentin dosage varies, Dayvigo 10 mg nightly, trazodone 100 mg nightly,  sertaline 15o mg . Returned to Cotempla AM, pramipexole  0.5 mg BID. Tolerating meds now. Patient was administered Spravato 84 mg intranasally today.  The patient experienced the typical dissociation which  gradually resolved over the 2-hour period of observation.  There were no complications.  Specifically the patient did not have nausea or vomiting or headache.   She has seen her depression drop from a 10/10 prior to treatment with Spravato to now 4/10 With additional benefit noted when she increased the pramipexole to 0.5 mg twice daily.  She is not having side effects with this like she did in the past. Anxiety levels are also improved. She is sleeping okay with the current medications. Plan: continue pramipexole trial off-label and increase more slowly for TRD.  Increase  Gradually  to 0.75 mg BID  09/11/22 appt noted: Psych meds:  Clonazepam 0.5 mg tablets 2 nightly, gabapentin dosage varies, Dayvigo 10 mg nightly, trazodone 100 mg nightly,  sertaline 15o mg . Returned to Cotempla AM, pramipexole  0.75 mg BID. Tolerating meds now. Patient was administered Spravato 84 mg intranasally today.  The patient experienced the typical dissociation which gradually resolved over the 2-hour period of observation.  There were no complications.  Specifically the patient did not have nausea or vomiting or headache.   She has seen her depression drop from a 10/10 prior to treatment with Spravato to now 4/10 With additional benefit noted when she increased the pramipexole to 0.5 mg twice daily.  She is not having side effects with this like she did in the past. Clearly improving with the pramipexole now and tolerating it.  Satisfied with current meds but would consider increasing if it might be helpful.  09/15/22 appt noted: Psych meds:  Clonazepam 0.5 mg tablets 2 nightly, gabapentin dosage varies, Dayvigo 10 mg nightly, trazodone 100 mg nightly,  sertaline 15o mg . Returned to Cotempla AM, pramipexole  0.75 mg BID too anxious and reduced to 0.5 mg BID. Tolerating meds now. Patient was administered Spravato 84 mg intranasally today.  The patient experienced the typical dissociation which gradually resolved over  the 2-hour period of observation.  There were no complications.  Specifically the patient  did not have nausea or vomiting or headache.   Trouble staying asleep longer than 6 hours.  Doesn't feel she can tolerate pramipexole 0.75 mg BID DT anxiety not experienced at  0.5 mg BID.  Otherwise tolerating meds.  Mood is better with Spravato and pramipexole.   Not sleeping as long with meds.  EMA 6 hours.  Wonders about change Disc concerns about waiting for therapy. Feels ready to reduce Spravato to weekly.    09/25/22 appt noted: Psych meds:  Clonazepam 0.5 mg tablets 2 nightly, gabapentin dosage varies, Dayvigo 10 mg nightly, trazodone 100 mg nightly,  sertaline 15o mg . Returned to Cotempla AM, pramipexole  0.75 mg BID too anxious and reduced to 0.5 mg BID. Tolerating meds now. Patient was administered Spravato 84 mg intranasally today.  The patient experienced the typical dissociation which gradually resolved over the 2-hour period of observation.  There were no complications.  Specifically the patient did not have nausea or vomiting or headache.   Trouble staying asleep longer than 6 hours.  Failed to respond to Seroquel 25 mg HS.  Gone back to [facility].  Belsomra NR. Mood ok until the last few days more dep bc it's been 10 days since last admin.  Wants to continue weekly.   No SE.  No med changes desired.  09/29/22 appt noted: Psych meds:  Clonazepam 0.5 mg tablets 2 nightly, gabapentin dosage varies, Dayvigo 10 mg nightly, trazodone 100 mg nightly,  sertaline 15o mg . Returned to Cotempla AM, pramipexole  0.75 mg BID too anxious and reduced to 0.5 mg BID. Tolerating meds now. Patient was administered Spravato 84 mg intranasally today.  The patient experienced the typical dissociation which gradually resolved over the 2-hour period of observation.  There were no complications.  Specifically the patient did not have nausea or vomiting or headache.   Trouble staying asleep longer than 6 hours.  Plan:  Continue sertaline to 200 mg daily.  It has helped anxiety but not depression.   for insomnia, clonazepam 1 mg HS. trazodone doesn't work well but helps some Continue Dayvigo 10 HS.  Failed resp Belsomra 15 and seroquel 25 She wants to continue Cotempla or Concerta bc helps mood early in day and focus  benefit lasts longer.  Have not been able to get stimulant to be consitently effective throughout the day. continue pramipexole trial off-label and reduce back to  TRD 0.5 mg BID Spravato weekly.  10/13/22 appt noted: Not sleeping as well.  Wants to increase Spravato to twice weekly bc feeling more dep close to the next sched date.  Just the day or 2 before.  .  Plan: Trial for off label TRD increase pramipexole 1 mg TID  10/20/22 appt noted: Psych meds:  Clonazepam 0.5 mg tablets 2 nightly, gabapentin dosage varies, Dayvigo 10 mg nightly, trazodone 100 mg nightly,  sertaline 15o mg . Returned to Cotempla AM, pramipexole  1 mg TID. Tolerating meds now. Patient was administered Spravato 84 mg intranasally today.  The patient experienced the typical dissociation which gradually resolved over the 2-hour period of observation.  There were no complications.  Specifically the patient did not have nausea or vomiting or headache.   Trouble staying asleep longer than 6 hours.  More dep this week and asked to get Spravato twice this week. Plan: Trial for off label TRD increase pramipexole 1 mg QID  10/22/22 appt noted:  Psych meds:  Clonazepam 0.5 mg tablets 2 nightly and another one with EMA, gabapentin  dosage varies, Belsomra 20 mg daily,  trazodone 100 mg nightly,  sertaline 15o mg . Returned to Cotempla AM, pramipexole  1 mg QID for 2nd day Tolerating meds now.  Except a little N and dizzy briefly after takes it. Patient was administered Spravato 84 mg intranasally today.  The patient experienced the typical dissociation which gradually resolved over the 2-hour period of observation.  There were no  complications.  Specifically the patient did not have nausea or vomiting or headache. No change in mood yet.   Sleep better with Belsomra 20 as long as takes with other meds.  If insomnia mood is worse. Ongoing some dep but gets partial relief with Spravato but would like better relief .  10/27/22 received Spravato 84  11/03/22 appt noted:  Meds as above except sertraline she cut to 100 mg instead of 200 mg daily She doesn't think sertraline helping and worries over poss SE Off and on Cotempla .  Not affecting BP. Doesn't notice benefit or SE with pramipexole 1 mg QID.  No SE Sleep is OK.  Some awakening aftter 6 hours but not enough so takes another clonazepam.   Patient was administered Spravato 84 mg intranasally today.  The patient experienced the typical dissociation which gradually resolved over the 2-hour period of observation.  There were no complications.  Specifically the patient did not have nausea or vomiting or headache. No change in mood yet.   Sleep better with Belsomra 20 as long as takes with other meds.  If insomnia mood is worse. Ongoing some dep but gets partial relief with Spravato but would like better relief .  No sig benefit with pramipexole 1 mg QID.  11/21/22 TC:  To ER with dep 11/21/22. No SE with selegiline but feels more dep.  Wants to stop it. Today is only day 2 of 1 tablet of selegiline 5 mg AM.  Reviewed with her that she had not been satisfied with the response of Spravato and Zoloft and that she is likely feeling worse because the Zoloft is wearing off and in particular because she abruptly stopped 150 mg daily.  She needs to give the selegiline time to work.  She agrees to do so.  She admitted to some passive suicidal thoughts but no active suicidal thoughts and no desire to die.  She agrees to the plan to increase selegiline to 10 mg every morning this weekend and to continue the selegiline trial.  She cannot stop this and immediately start another AD due to risk  DDI with serotonin syndrome.  She is aware.  Meredith Staggers, MD, DFAPA  12/19/22 TC: Pt called at 1:42p.  She said she wanted to know if she could start weaning off the Selegiline.  She thinks increasing the dosage is making things worse.  She said she wants to go back to [employer].  She acknowleged she has an appt on Monday. I advised her that I wasn't sure Dr Jennelle Human will see her message before her appt, but she still ask me to send the message.  Next appt 8/26 MD resp.  No change until Monday appt.  12/22/22 appt noted: Meds: selegiline up to 5 mg 4 daily for a week but felt it incr irritability and went back to 3 mg daily. She wants to go back to Spravato.  Not near as dep as now.  Selegiline didn't help energy. Still on Belsomra Thinks maybe sertraline helped mood more than she thought. More dep than anxiety.   Plan:  DC selegiline and will retry Effexor which helped in the past at 300 mg daily.  Start this Friday after over 10 half lives of selegiline.    12/24/22 appt noted: Stopped selegiline  Psych med: belsomra 20, clonazepam 1 mg HS.   She has felt more dep off the Spravato though at the time didn't think it was helping.  Has felt more down and withdrawn.  Low energy, motivation, not smiling.  No SI.  Sleep ok.   Patient was administered Spravato 84 mg intranasally today.  The patient experienced the typical dissociation which gradually resolved over the 2-hour period of observation.  There were no complications.  Specifically the patient did not have nausea or vomiting or headache. No problems with BP or other unusual SE Mood is a little better after this administration of Spravato.  Very motivated to continue it.  No immediate med concerns. No SI .  Ongoing anhedonia.  12/25/22 appt noted: Psych med: belsomra 20, clonazepam 1 mg HS.  Not resumed stimulant or AD yet   No SE. Patient was administered Spravato 84 mg intranasally today.  The patient experienced the typical dissociation  which gradually resolved over the 2-hour period of observation.  There were no complications.  Specifically the patient did not have nausea or vomiting or headache.  There is a lot things going on in her life and it is somewhat difficult to separate stressors from what is going on with her mood.  However she is happy to resume Spravato as she decided after stopping briefly that it was helpful.  She plans to start the antidepressant Morrow.  She also wants to resume the stimulant tomorrow.  12/30/22 appt noted; Psych med: belsomra 20, clonazepam 1 mg HS.  Not resumed stimulant.  Effexor XR 37.5 mg daily   No SE. Patient was administered Spravato 84 mg intranasally today.  The patient experienced the typical dissociation which gradually resolved over the 2-hour period of observation.  There were no complications.  Specifically the patient did not have nausea or vomiting or headache.  She is still having depression with very prominent anhedonia.  However she feels the Spravato is somewhat helpful and is hopeful for more complete response as she gets back onto a reasonable antidepressant dose.  She tolerated Effexor XR 300 mg daily in the past and took it for 5 months and it is probably the antidepressants worked best for her.  She is agreeable with this plan.  She is getting sleep benefit from the current medications Plan: incr Effexor 75 mg daily  01/02/23 appt noted: Psych meds as noted above except increase Effexor XR to 75 mg daily without side effects. Patient was administered Spravato 84 mg intranasally today.  The patient experienced the typical dissociation which gradually resolved over the 2-hour period of observation.  There were no complications.  Specifically the patient did not have nausea or vomiting or headache.  Agrees to increasing Effexor as planned.  SX not changed from that noted above.  No SI  01/05/23 appt noted: Psych meds as noted above except increase Effexor XR to 112.5 mg daily  without side effects. Patient was administered Spravato 84 mg intranasally today.  The patient experienced the typical dissociation which gradually resolved over the 2-hour period of observation.  There were no complications.  Specifically the patient did not have nausea or vomiting or headache.  Agrees to increasing Effexor as planned gradually to 300 mg daily.  SX not changed from that noted above.  Does however feel Spravato has dampened the degree of depression.  No SI Stressful life event discussed and won't be resolved until early next year.  01/07/23 appt noted: Psych meds Effexor XR 112.5 mg daily. Patient was administered Spravato 84 mg intranasally today.  The patient experienced the typical dissociation which gradually resolved over the 2-hour period of observation.  There were no complications.  Specifically the patient did not have nausea or vomiting or headache.  Agrees to increasing Effexor as planned gradually to 300 mg daily.  SX not changed from that noted above.  Does however feel Spravato has dampened the degree of depression markedly compared to when off psych meds and no Spravato.  No SI  01/12/23 appt noted; Psych meds Effexor XR 225 mg daily. No SE notedl. Patient was administered Spravato 84 mg intranasally today.  The patient experienced the typical dissociation which gradually resolved over the 2-hour period of observation.  There were no complications.  Specifically the patient did not have nausea or vomiting.  But is having headache.  Agrees to increasing Effexor as planned gradually to 300 mg daily.  SX not changed from that noted above.  Does however feel Spravato has dampened the degree of depression markedly compared to when off psych meds and no Spravato.  No SI   AIMS    Flowsheet Row Video Visit from 01/03/2022 in [facility]  AIMS Total Score 0      ECT-MADRS    Flowsheet Row Clinical Support from 08/13/2022 in [facility]  [facility] Video Visit from 04/29/2022 in [facility] Video Visit from 02/06/2022 in [facility]  MADRS Total Score 27 44 23      GAD-7    Flowsheet Row Counselor from 12/19/2022 in [facility] Office Visit from 08/22/2022 in [facility] [place]  Total GAD-7 Score 4 7      PHQ2-9    Flowsheet Row Counselor from 12/19/2022 in [facility] Office Visit from 08/22/2022 in [facility] [place] Office Visit from 08/19/2022 in [facility] Video Visit from 02/06/2022 in [facility] Office Visit from 11/16/2020 in [facility] for [facility] at [facility] Total Score 2 3 0 6 0  PHQ-9 Total Score 5 5 -- 14 --        Past Psychiatric Medication Trials: Trintellix - Initially effective and then was not effective when re-started Paxil- Ineffective. Helped initially. Prozac-Insomnia Lexapro- Effective and then no longer as effective Zoloft- Initially effective and then minimal response Viibryd Cymbalta- Ineffective. Helped initially. Effexor XR 300- Effective, well tolerated. Pristiq- Increased anxiety at 150 mg daily Wellbutrin XL-Ineffective.May have increased anxiety. Amitriptyline Nortriptyline-Caused irritability, constipation Auvelity- ineffective Selegilne brief 20 mg daily , more irritable  Abilify- Helpful but caused severe insomnia. Rexulti-Helpful but caused insomnia. Vraylar Seroquel - Ineffective Risperdal Olanzapine- Ineffective Latuda- Ineffective Caplyta  Klonopin- Effective Temazepam- Took during menopause Lunesta- Ineffective Ambien-Ineffective Sonata- Ineffective Dayvigo- partially effective Failed resp Belsomra 15 and seroquel 25 Trazodone-  somewhat  effective Remeron-Ineffective. Does not recall wt gain.  Quetiapine 25 NR  Adderall- Took once and had adverse effect. Concerta- Effective but only 4-6 hours Jornay 80 NR Metadate-Not as effective as Concerta Dexmethylphenidate- Not as effective as Concerta Azstarys- some side effects. Not as effective.  Ritalin short acting and SE anxiety & HTN @  20 TID Cotemplay 17.3  Daytrana  Lamictal- May have had an adverse effects. "Felt weird." Reports worsening s/s.  Lithium ? effect Gabapentin Pramipexole- 1 mg QID NR  ECT #20 with minimal response. H says it was partly helpful.  AIMS    Flowsheet Row Video Visit from 01/03/2022 in [facility]  AIMS Total Score 0      ECT-MADRS    Flowsheet Row Clinical Support from 08/13/2022 in [facility] Video Visit from 04/29/2022 in [facility] Video Visit from 02/06/2022 in [facility]  MADRS Total Score 27 44 23      GAD-7    Flowsheet Row Counselor from 12/19/2022 in [facility] Office Visit from 08/22/2022 in [facility] [place]  Total GAD-7 Score 4 7      PHQ2-9    Flowsheet Row Counselor from 12/19/2022 in [facility] Office Visit from 08/22/2022 in [facility] [place] Office Visit from 08/19/2022 in [facility] at [facility] Video Visit from 02/06/2022 in [facility] Office Visit from 11/16/2020 in [facility] for [facility]  PHQ-2 Total Score 2 3 0 6 0  PHQ-9 Total Score 5 5 -- 14 --        Review of Systems:  Review of Systems  Cardiovascular:  Negative for palpitations.  Neurological:  Positive for headaches. Negative for dizziness, tremors and weakness.  Psychiatric/Behavioral:  Positive for dysphoric mood and  sleep disturbance. Negative for agitation, decreased concentration and suicidal ideas. The patient is nervous/anxious.     Medications: I have reviewed the patient's current medications.  Current Outpatient Medications  Medication Sig Dispense Refill   clonazePAM (KLONOPIN) 0.5 MG tablet 2 tablets at night and 1 tablet as needed daily for panic anxiety (Patient taking differently: Take 1 mg by mouth at bedtime.) 75 tablet 1   Esketamine HCl, 84 MG Dose, (SPRAVATO, 84 MG DOSE,) 28 MG/DEVICE SOPK Place 84 mg into the nose every 3 (three) days. 3 each 5   Estradiol (VAGIFEM) 10 MCG TABS vaginal tablet Place 1 tablet (10 mcg total) vaginally 2 (two) times a week. 24 tablet 3   Suvorexant (BELSOMRA) 20 MG TABS TAKE 1 TABLET BY MOUTH AT BEDTIME 30 tablet 2   valACYclovir (VALTREX) 500 MG tablet Take one twice daily for 3 days with any symptoms 30 tablet 3   valsartan (DIOVAN) 80 MG tablet Take 1 tablet (80 mg total) by mouth daily. 90 tablet 3   venlafaxine XR (EFFEXOR-XR) 75 MG 24 hr capsule Take 3 capsules (225 mg total) by mouth daily. (Patient taking differently: Take 225 mg by mouth daily. Starting today) 270 capsule 0   No current facility-administered medications for this visit.    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   TRANSAMINASES, SERUM, ELEVATED 08/14/2009    Past Medical History, Surgical history, Social history, and Family history were reviewed and updated as appropriate.  Please see review of systems for further details on the patient's review from today.   Objective:   Physical Exam:  LMP 12/27/2007 (Exact Date)    Physical Exam Constitutional:      General: She is not in acute distress. Musculoskeletal:        General: No deformity.  Neurological:     Mental Status: She is alert and oriented to person, place, and time.     Coordination: Coordination normal.  Psychiatric:        Attention and Perception: Attention and perception normal. She does not perceive auditory hallucinations.        Mood and Affect: Mood is anxious and depressed. Affect is blunt.        Speech: Speech normal. Speech is not slurred.        Behavior: Behavior normal.        Thought Content: Thought content normal. Thought content is not delusional. Thought content does not include homicidal or suicidal ideation. Thought content does not include suicidal plan.        Cognition and Memory: Cognition and memory normal.        Judgment: Judgment normal.     Comments: Insight intact Mood still mod dep but better.  She is doing better overall. Affect blunted without change     Lab Review:     Component Value Date/Time   NA 132 (L) 10/27/2022 1107   K 4.4 10/27/2022 1107   CL 95 (L) 10/27/2022 1107   CO2 30 10/27/2022 1107   GLUCOSE 89 10/27/2022 1107   BUN 14 10/27/2022 1107   CREATININE 0.67 10/27/2022 1107   CREATININE 0.68 01/26/2020 0920   CALCIUM 10.1 10/27/2022 1107   PROT 8.3 04/22/2022 0932   ALBUMIN 5.3 (H) 04/22/2022 0932   AST 82 (H) 04/22/2022 0932   ALT 118 (H) 04/22/2022 0932   ALKPHOS 74 04/22/2022 0932   BILITOT 0.3 04/22/2022 0932   GFRNONAA >90 05/01/2014 1645   GFRAA >90 05/01/2014 1645       Component Value Date/Time   WBC 3.3 (L) 04/22/2022 0932   RBC 4.25 04/22/2022 0932   HGB 14.3 04/22/2022 0932   HGB 14.1 10/26/2014 1034   HCT 40.7 04/22/2022 0932   PLT 245.0 04/22/2022 0932   MCV 95.7 04/22/2022 0932   MCH 34.4 (H) 01/26/2020 0920   MCHC 35.1 04/22/2022 0932   RDW 13.2 04/22/2022 0932   LYMPHSABS 0.9 04/22/2022 0932   MONOABS 0.2 04/22/2022 0932   EOSABS 0.1 04/22/2022 0932    BASOSABS 0.0 04/22/2022 0932    No results found for: "POCLITH", "LITHIUM"   No results found for: "PHENYTOIN", "PHENOBARB", "VALPROATE", "CBMZ"   .res Assessment: Plan:    Josenid was seen today for follow-up, depression, anxiety, add and stress.  Diagnoses and all orders for this visit:  Recurrent major depression resistant to treatment [facility])  Generalized anxiety disorder  Attention deficit hyperactivity disorder (ADHD), predominantly inattentive type  Insomnia, unspecified type  Stressful life event affecting family     Pt seen for TRD with multiple failed medications as noted above.  Some of these failures have been related to an adequate dose and duration as she has had a tendency to get frustrated with medication trials.TRD need 6 week trial of AD at full dose at least.   She has had no problems with the transition which has been started from selegiline to Effexor XR.  No SE thus far.  Patient was administered Spravato 84 mg  intranasally today.  The patient experienced the typical dissociation which gradually resolved over the 2-hour period of observation.  There were no complications.  Specifically the patient did not have nausea or vomiting or headache.  Blood pressures remained within normal ranges at the 40-minute and 2-hour follow-up intervals.  By the time the 2-hour observation period was met the patient was alert and oriented and able to exit without assistance.  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  See nursing note for further details.  Consider switch Spravato to MAOI, Parnate  We discussed the short-term risks associated with benzodiazepines including sedation and increased fall risk among others.  Discussed long-term side effect risk including dependence, potential withdrawal symptoms, and the potential eventual dose-related risk of dementia.  But recent studies from  2020 dispute this association between benzodiazepines and dementia risk. Newer studies in 2020 do not support an association with dementia.  for insomnia, clonazepam 1 mg HS.  Some better so far with Belsomra 20 mg HS  Cotempla or Concerta bc helps mood early in day and focus  benefit lasts longer.  Have not been able to get stimulant to be consitently effective throughout the day.  increasing venlafaxine XR to 225 mg today.  She will increase to XR 300 mg daily next week.  This was a dose that she took and tolerated in the past.  Disc pretreating Spravato with NSAID or Tylenol bc of HA lately.  Supportive therapy dealing with serious stressful life event.  FU twice weekly Spravato until next week then try to drop back to weekly .  Meredith Staggers, MD, DFAPA    Please see After Visit Summary for patient specific instructions.  Future Appointments  Date Time Provider Department Center  01/15/2023  1:00 PM Cottle, Steva Ready., MD [facility] None  01/15/2023  1:00 PM CP-NURSE [facility] None  01/29/2023 11:00 AM Gaspar Bidding, [facility] None  02/09/2023  1:00 PM Gaspar Bidding, [facility] None  02/16/2023  1:00 PM Gaspar Bidding [facility] None  02/23/2023  1:00 PM Gaspar Bidding [facility] None  03/02/2023  2:00 PM Gaspar Bidding, [facility] CP-CP None  09/18/2023  8:15 AM Jerene Bears, MD [facility]    No orders of the defined types were placed in this encounter.   -------------------------------

## 2023-01-15 ENCOUNTER — Ambulatory Visit: Payer: 59

## 2023-01-15 ENCOUNTER — Encounter: Payer: 59 | Admitting: Psychiatry

## 2023-01-15 ENCOUNTER — Ambulatory Visit (INDEPENDENT_AMBULATORY_CARE_PROVIDER_SITE_OTHER): Payer: 59 | Admitting: Psychiatry

## 2023-01-15 ENCOUNTER — Encounter: Payer: Self-pay | Admitting: Psychiatry

## 2023-01-15 VITALS — BP 127/82 | HR 74

## 2023-01-15 DIAGNOSIS — F900 Attention-deficit hyperactivity disorder, predominantly inattentive type: Secondary | ICD-10-CM | POA: Diagnosis not present

## 2023-01-15 DIAGNOSIS — G4700 Insomnia, unspecified: Secondary | ICD-10-CM | POA: Diagnosis not present

## 2023-01-15 DIAGNOSIS — F411 Generalized anxiety disorder: Secondary | ICD-10-CM | POA: Diagnosis not present

## 2023-01-15 DIAGNOSIS — F339 Major depressive disorder, recurrent, unspecified: Secondary | ICD-10-CM

## 2023-01-15 MED ORDER — LITHIUM CARBONATE ER 300 MG PO TBCR
300.0000 mg | EXTENDED_RELEASE_TABLET | Freq: Every evening | ORAL | 0 refills | Status: DC
Start: 2023-01-15 — End: 2023-02-23

## 2023-01-15 NOTE — Progress Notes (Signed)
Heather Davila [id number] 1961/04/17 62 y.o.  Subjective:   Patient ID:  Heather Davila is a 62 y.o. (DOB [birth year]) female.  Chief Complaint:  Chief Complaint  Patient presents with   Follow-up   Depression   ADD   Sleeping Problem    HPI Heather Davila presents to the office today for follow-up of treatment resistant recurrent depression, generalized anxiety disorder and insomnia.  Almost too numerous to count med failures. She is here for Spravato administration for her treatment resistant depression.  05/21/22 appt noted: Patient was administered first dose Spravato 56 mg intranasally today.  The patient experienced the typical dissociation which gradually resolved over the 2-hour period of observation.  There were no complications.  Specifically the patient did not have nausea or vomiting or headache.   Dissociation was mild. Did not experience any emotional relief from disabling depression.wants to increase Spravato to the usual dose.  She is aware insurance is not covering the costs at this time.  No SI acutely. Tolerating meds.    05/23/22 appt noted: Patient was administered Spravato 84 mg intranasally today.  The patient experienced the typical dissociation which gradually resolved over the 2-hour period of observation.  There were no complications.  Specifically the patient did not have nausea or vomiting.   Dissociation was greater than last dose and a little bothersome DT some HA. Tolerating meds.   Mood has not changed significantly thus far with Spravato.  05/27/22 appt noted: Patient was administered Spravato 84 mg intranasally today.  The patient experienced the typical dissociation which gradually resolved over the 2-hour period of observation.  There were no complications.  Specifically the patient did not have nausea or vomiting or headache. Today she has felt a little relief from the pressing heaviness of depression but a little more anxiety.  During administration of  Spravato she listens to [place] music and was able to relax a little more with the feeling of dissociation that was mildly bothersome. Husband was also in on the session today and asked some questions about IV ketamine versus nasal spray ketamine.  05/29/22 appt noted: Cancelled today DT hypertension  05/30/22 appt noted: Patient was administered Spravato 84 mg intranasally today.  The patient experienced the typical dissociation which gradually resolved over the 2-hour period of observation.  There were no complications.  Specifically the patient did not have nausea or vomiting or headache. She is seeing some improvement in mood with less continuous depression and less intensity.  Hopefulness is better.   No med complaints or SE  06/05/22 appt noted: Patient was administered Spravato 84 mg intranasally today.  The patient experienced the typical dissociation which gradually resolved over the 2-hour period of observation.  There were no complications.  Specifically the patient did not have nausea or vomiting or headache.  Specifically HA is less of a problem with Spravato. No SE with meds. Depression is improving with less intensity.  Intermittent anxiety easily.  More able to enjoy things.  No new concerns.  06/17/22 appt noted: Patient was administered Spravato 84 mg intranasally today.  The patient experienced the typical dissociation which gradually resolved over the 2-hour period of observation.  There were no complications.  Specifically the patient did not have nausea or vomiting or headache.  Specifically HA is less of a problem with Spravato. No SE with meds. Depression is improved 45% with less intensity.  Intermittent anxiety less easily.  More able to enjoy things.  No new concerns.  More active. Current meds:  increased clonazepam to about 1.5 mg HS DT recent insomnia, lexapro 20, Dayvigo 10 mg HS, concerta 36 mg AM, trazodone 100 mg HS.  06/20/22 appt noted: Current meds: increased  clonazepam to about 1.5 mg HS DT recent insomnia, lexapro 20, Dayvigo 10 mg HS, concerta 36 mg AM, trazodone 100 mg HS. Patient was administered Spravato 84 mg intranasally today.  The patient experienced the typical dissociation which gradually resolved over the 2-hour period of observation.  There were no complications.  Specifically the patient did not have nausea or vomiting or headache.  Specifically HA is less of a problem with Spravato. No SE with meds. Depression is improved 45% with less intensity.  Intermittent anxiety less easily.  More able to enjoy things.  No new concerns.  More active.  06/23/22 appt noted:  Current meds: increased clonazepam to about 1.5 mg HS DT recent insomnia, lexapro 20, Dayvigo 10 mg HS, concerta 36 mg AM, trazodone 100 mg HS. Patient was administered Spravato 84 mg intranasally today.  The patient experienced the typical dissociation which gradually resolved over the 2-hour period of observation.  There were no complications.  Specifically the patient did not have nausea or vomiting or headache.  Specifically HA is less of a problem with Spravato. No SE with meds. Intensity of Spravato dissociation varies.  Last week very intesne and this week more typical.  Depression is continuing to improve with better interest and activity and motivation.  Gradual improvement.  Wants to continue.  06/25/22 appt noted: Current meds: increased clonazepam to about 1.5 mg HS DT recent insomnia, lexapro 20, Dayvigo 10 mg HS, concerta 36 mg AM, trazodone 100 mg HS. Patient was administered Spravato 84 mg intranasally today.  The patient experienced the typical dissociation which gradually resolved over the 2-hour period of observation.  There were no complications.  Specifically the patient did not have nausea or vomiting or headache.  Specifically HA is less of a problem with Spravato. No SE with meds. Intensity of Spravato dissociation varies.  No scary and well  tolerated. Continues to gradually improve with Spravato.  She is more motivated and enjoying things more.  H sees progress.  Wants to continue twice weekly until gets maximum response.  Dep is not gone yet.  06/30/22 appt noted:  seen with H Current meds: increased clonazepam to about 1.5 mg HS DT recent insomnia, lexapro 20, Dayvigo 10 mg HS, concerta 36 mg AM, trazodone 100 mg HS. Patient was administered Spravato 84 mg intranasally today.  The patient experienced the typical dissociation which gradually resolved over the 2-hour period of observation.  There were no complications.  Specifically the patient did not have nausea or vomiting or headache.  Specifically HA is less of a problem with Spravato. No SE with meds. Intensity of Spravato dissociation varies.  No scary and well tolerated. Continues to gradually improve with Spravato.  She is more motivated and enjoying things more.  H sees even more progress than she does; more active and smiling.  Wants to continue twice weekly until gets maximum response.  Dep is 80% better.  07/07/22 appt noted: Current meds: increased clonazepam to about 1.5 mg HS DT recent insomnia, lexapro 20, Dayvigo 10 mg HS, concerta 36 mg AM, trazodone 100 mg HS. Patient was administered Spravato 84 mg intranasally today.  The patient experienced the typical dissociation which gradually resolved over the 2-hour period of observation.  There were no complications.  Specifically the patient did not have nausea or vomiting  or headache.  Specifically HA is less of a problem with Spravato. No SE with meds. Intensity of Spravato dissociation varies.  No scary and well tolerated.  Was more intese today. Overall mood is markedly better and please with meds. No changes desired.  07/09/22 appt noted: In transition from Lexapro to sertraline and missing Concerta bc CO crash from it after about 4 hours.  Disc these concerns.  She'll discuss also with Corie Chiquito, NP More dep the  last couple of days and concerned about reducing Spravato frequency while transition from one antidep to another.  Affect less positive. Patient was administered Spravato 84 mg intranasally today.  The patient experienced the typical dissociation which gradually resolved over the 2-hour period of observation.  There were no complications.  Specifically the patient did not have nausea or vomiting or headache.  Specifically HA is less of a problem with Spravato. No SE with meds. Intensity of Spravato dissociation varies.  No scary and well tolerated.   07/14/22 appt noted: Has been more depressed this week.  More trouble with sleep.  Taking clonazepam 1-1.5  of the 0.5 mg tablets.   Trazodone not helping much. She switched back from 200 sertraline to Lexapro 20.  No SE More trouble with motivation.  Some stress with son with special needs. Misses the benevit of Concerta but it was too short acting and crashed in 4-6 hours. Patient was administered Spravato 84 mg intranasally today.  The patient experienced the typical dissociation which gradually resolved over the 2-hour period of observation.  There were no complications.  Specifically the patient did not have nausea or vomiting or headache.  Specifically HA is less of a problem with Spravato. No SE with meds. Intensity of Spravato dissociation varies.  No scary and well tolerated.   07/17/22 appt noted: Patient was administered Spravato 84 mg intranasally today.  The patient experienced the typical dissociation which gradually resolved over the 2-hour period of observation.  There were no complications.  Specifically the patient did not have nausea or vomiting or headache.  Specifically HA is less of a problem with Spravato. No SE with meds. Intensity of Spravato dissociation varies.  Not scary and well tolerated.  Just got Jornay last night but wasn't sure how to take it.  Wants to start and this was discussed.  She felt MPH helped mood and attn but  didn't last long enough and had crash after a few hours on Concerta.  Better for 2 hours after each dose Ritalin 20 mg with mood but then it wore off.  BP up after 3rd dose 160/90 and then took H's amlodipine 5 and BP became normal.  07/22/22 appt noted: Patient was administered Spravato 84 mg intranasally today.  The patient experienced the typical dissociation which gradually resolved over the 2-hour period of observation.  There were no complications.  Specifically the patient did not have nausea or vomiting or headache.  Specifically HA is less of a problem with Spravato. No SE with meds. Intensity of Spravato dissociation varies.  Not scary and well tolerated.  Start Jornay 20 mg over the weekend and did not notice any particular effect good or bad.  Increased to 40 mg. Other psych meds: Clonazepam 0.5 mg tablets 2 nightly, gabapentin dosage varies, Dayvigo 10 mg nightly, Lexapro 20 mg daily, to trazodone 100 mg nightly  07/24/22 appt noted: Patient was administered Spravato 84 mg intranasally today.  The patient experienced the typical dissociation which gradually resolved over the 2-hour  period of observation.  There were no complications.  Specifically the patient did not have nausea or vomiting or headache.  Specifically HA is less of a problem with Spravato. No SE with meds. Intensity of Spravato dissociation varies.  Not scary and well tolerated.  Other psych meds: Clonazepam 0.5 mg tablets 2 nightly, gabapentin dosage varies, Dayvigo 10 mg nightly, Lexapro 20 mg daily, to trazodone 100 mg nightly, Jornay 40 mg PM Doing well with Spravato.  Noticed a little effect from the Jornay 40 without SE but would like to increase the dose for ADD and mood.  Sleeping well.  No new concerns.  Still more depressed than she was with th initial benefit of Spravato.  07/30/22 note:  Patient was administered Spravato 84 mg intranasally today.  The patient experienced the typical dissociation which gradually  resolved over the 2-hour period of observation.  There were no complications.  Specifically the patient did not have nausea or vomiting or headache.  Specifically HA is less of a problem with Spravato. Other psych meds: Clonazepam 0.5 mg tablets 2 nightly, gabapentin dosage varies, Dayvigo 10 mg nightly, Lexapro 20 mg daily, to trazodone 100 mg nightly, Jornay 60 mg PM Wants to increase Jornay to 80 mg PM to increase benefit for ADD and depression. No SE She has experienced a major family crisis that threatens to change her daily life from now on.  It has not triggered suicidal thoughts at this time but she is very afraid.  No desire to change medications.  08/04/22 appt noted" Patient was administered Spravato 84 mg intranasally today.  The patient experienced the typical dissociation which gradually resolved over the 2-hour period of observation.  There were no complications.  Specifically the patient did not have nausea or vomiting or headache.  Specifically HA is less of a problem with Spravato. Other psych meds: Clonazepam 0.5 mg tablets 2 nightly, gabapentin dosage varies, Dayvigo 10 mg nightly, Lexapro 20 mg daily, trazodone 100 mg nightly, Jornay 80 mg PM No SE No benefit or SE with increase Jormay 80 mg.  Says Concerta helped ADD and depression but not Jornay.  However duration was insufficient.  Still depressed and wants change to another form of stimulant.  No concerns with other meds. Continues with strong family stressors creating uncertainty about her future but no quick resolution. No SI Trial Cotempla 17.3 and DC Ritalin & Jornay 80  08/11/2022 appointment noted: Patient was administered Spravato 84 mg intranasally today.  The patient experienced the typical dissociation which gradually resolved over the 2-hour period of observation.  There were no complications.  Specifically the patient did not have nausea or vomiting or headache.  Specifically HA is less of a problem with Spravato now  vs when started it.. Other psych meds: Clonazepam 0.5 mg tablets 2 nightly, gabapentin dosage varies, Dayvigo 10 mg nightly, trazodone 100 mg nightly, cotempla 17.3, switch from Lexapro 20 to sertaline 15o mg . No noticeable effects sertraline from for depression but has seen some antianxiety effects from the sertraline.  No side effects noted.  No side effects with the other medicines.  Still is only getting very brief benefit 3 to 4 hours from Cotempla for ADD and mood.  She wants to try the Daytrana patch as we have discussed before. No SE  08/13/22 appt: Patient was administered Spravato 84 mg intranasally today.  The patient experienced the typical dissociation which gradually resolved over the 2-hour period of observation.  There were no complications.  Specifically  the patient did not have nausea or vomiting or headache.  Specifically HA is less of a problem with Spravato now vs when started it.. Other psych meds: Clonazepam 0.5 mg tablets 2 nightly, gabapentin dosage varies, Dayvigo 10 mg nightly, trazodone 100 mg nightly, cotempla 17.3, switch from Lexapro 20 to sertaline 15o mg . No noticeable effects sertraline from for depression but has seen some antianxiety effects from the sertraline.  No side effects noted.  No side effects with the other medicines.  Still is only getting very brief benefit 3 to 4 hours from Cotempla for ADD and mood.  She wants to try the Daytrana patch as we have discussed before.  Hasn't been able to get it yet. No SE Plan.  Continue to try to get Daytrana 30 AM  08/18/22 appt noted: Patient was administered Spravato 84 mg intranasally today.  The patient experienced the typical dissociation which gradually resolved over the 2-hour period of observation.  There were no complications.  Specifically the patient did not have nausea or vomiting or headache.  Specifically HA is less of a problem with Spravato now vs when started it.. Other psych meds: Clonazepam 0.5 mg  tablets 2 nightly, gabapentin dosage varies, Dayvigo 10 mg nightly, trazodone 100 mg nightly, cotempla 17.3, switch from Lexapro 20 to sertaline 15o mg . No noticeable effects sertraline from for depression but has seen some antianxiety effects from the sertraline.  No side effects noted.  No side effects with the other medicines.  Still is only getting very brief benefit 3 to 4 hours from Cotempla for ADD and mood.  She wants to try the Daytrana patch as we have discussed before.  Hasn't been able to get it yet.  Overall depression is some better than it was.   No SE Plan.  Continue to try to get Daytrana 30 AM  08/25/22 appt : No benefit Daytrana.  No SE.  Wants further med changes and interested In retrying pramipexole.   Tolerating meds. Patient was administered Spravato 84 mg intranasally today.  The patient experienced the typical dissociation which gradually resolved over the 2-hour period of observation.  There were no complications.  Specifically the patient did not have nausea or vomiting or headache.  Specifically HA is less of a problem with Spravato now vs when started it.. Other psych meds: Clonazepam 0.5 mg tablets 2 nightly, gabapentin dosage varies, Dayvigo 10 mg nightly, trazodone 100 mg nightly, , switch from Lexapro 20 to sertaline 15o mg . Daytrana 30 AM for a few days. Struggles with depression and no SI.  Reduced interest and mood and motivation and enjoyment and socialization.  08/27/22 appt noted: Psych meds:  Clonazepam 0.5 mg tablets 2 nightly, gabapentin dosage varies, Dayvigo 10 mg nightly, trazodone 100 mg nightly,  sertaline 15o mg . Returned to [employer] AM, pramipexole  Tolerating meds. Patient was administered Spravato 84 mg intranasally today.  The patient experienced the typical dissociation which gradually resolved over the 2-hour period of observation.  There were no complications.  Specifically the patient did not have nausea or vomiting or headache.   Reports  taking cotempla bc brief mood benefit and sig focus and productivity benefit. Took 1 dose pramipexole and felt more dep.  09/01/22 appt noted: Psych meds:  Clonazepam 0.5 mg tablets 2 nightly, gabapentin dosage varies, Dayvigo 10 mg nightly, trazodone 100 mg nightly,  sertaline 15o mg . Returned to Cotempla AM, pramipexole  0.25 mg BID. Tolerating meds now. Patient was administered Spravato  84 mg intranasally today.  The patient experienced the typical dissociation which gradually resolved over the 2-hour period of observation.  There were no complications.  Specifically the patient did not have nausea or vomiting or headache.   Willing to try the pramipexole and will continue 0.25 mg BID this week and then consider increase if tolerated.  Mood is a little better.  Still looking for further improvement.  09/03/22 appt noted: Psych meds:  Clonazepam 0.5 mg tablets 2 nightly, gabapentin dosage varies, Dayvigo 10 mg nightly, trazodone 100 mg nightly,  sertaline 15o mg . Returned to Cotempla AM, pramipexole  0.25 mg tablet 1 and 1/2 tablets twice daily BID. Tolerating meds now. Patient was administered Spravato 84 mg intranasally today.  The patient experienced the typical dissociation which gradually resolved over the 2-hour period of observation.  There were no complications.  Specifically the patient did not have nausea or vomiting or headache.   Depression may be a little better with the parmipexole and is tolerating it well so far.  Still struggling with focus and residual depression.  Tolerating meds without SE.  More hopeful and able to enjoy some things.  09/08/22 appt  Psych meds:  Clonazepam 0.5 mg tablets 2 nightly, gabapentin dosage varies, Dayvigo 10 mg nightly, trazodone 100 mg nightly,  sertaline 15o mg . Returned to Cotempla AM, pramipexole  0.5 mg BID. Tolerating meds now. Patient was administered Spravato 84 mg intranasally today.  The patient experienced the typical dissociation which  gradually resolved over the 2-hour period of observation.  There were no complications.  Specifically the patient did not have nausea or vomiting or headache.   She has seen her depression drop from a 10/10 prior to treatment with Spravato to now 4/10 With additional benefit noted when she increased the pramipexole to 0.5 mg twice daily.  She is not having side effects with this like she did in the past. Anxiety levels are also improved. She is sleeping okay with the current medications. Plan: continue pramipexole trial off-label and increase more slowly for TRD.  Increase  Gradually  to 0.75 mg BID  09/11/22 appt noted: Psych meds:  Clonazepam 0.5 mg tablets 2 nightly, gabapentin dosage varies, Dayvigo 10 mg nightly, trazodone 100 mg nightly,  sertaline 15o mg . Returned to Cotempla AM, pramipexole  0.75 mg BID. Tolerating meds now. Patient was administered Spravato 84 mg intranasally today.  The patient experienced the typical dissociation which gradually resolved over the 2-hour period of observation.  There were no complications.  Specifically the patient did not have nausea or vomiting or headache.   She has seen her depression drop from a 10/10 prior to treatment with Spravato to now 4/10 With additional benefit noted when she increased the pramipexole to 0.5 mg twice daily.  She is not having side effects with this like she did in the past. Clearly improving with the pramipexole now and tolerating it.  Satisfied with current meds but would consider increasing if it might be helpful.  09/15/22 appt noted: Psych meds:  Clonazepam 0.5 mg tablets 2 nightly, gabapentin dosage varies, Dayvigo 10 mg nightly, trazodone 100 mg nightly,  sertaline 15o mg . Returned to Cotempla AM, pramipexole  0.75 mg BID too anxious and reduced to 0.5 mg BID. Tolerating meds now. Patient was administered Spravato 84 mg intranasally today.  The patient experienced the typical dissociation which gradually resolved over  the 2-hour period of observation.  There were no complications.  Specifically the patient did  not have nausea or vomiting or headache.   Trouble staying asleep longer than 6 hours.  Doesn't feel she can tolerate pramipexole 0.75 mg BID DT anxiety not experienced at  0.5 mg BID.  Otherwise tolerating meds.  Mood is better with Spravato and pramipexole.   Not sleeping as long with meds.  EMA 6 hours.  Wonders about change Disc concerns about waiting for therapy. Feels ready to reduce Spravato to weekly.    09/25/22 appt noted: Psych meds:  Clonazepam 0.5 mg tablets 2 nightly, gabapentin dosage varies, Dayvigo 10 mg nightly, trazodone 100 mg nightly,  sertaline 15o mg . Returned to Cotempla AM, pramipexole  0.75 mg BID too anxious and reduced to 0.5 mg BID. Tolerating meds now. Patient was administered Spravato 84 mg intranasally today.  The patient experienced the typical dissociation which gradually resolved over the 2-hour period of observation.  There were no complications.  Specifically the patient did not have nausea or vomiting or headache.   Trouble staying asleep longer than 6 hours.  Failed to respond to Seroquel 25 mg HS.  Gone back to [facility].  Belsomra NR. Mood ok until the last few days more dep bc it's been 10 days since last admin.  Wants to continue weekly.   No SE.  No med changes desired.  09/29/22 appt noted: Psych meds:  Clonazepam 0.5 mg tablets 2 nightly, gabapentin dosage varies, Dayvigo 10 mg nightly, trazodone 100 mg nightly,  sertaline 15o mg . Returned to Cotempla AM, pramipexole  0.75 mg BID too anxious and reduced to 0.5 mg BID. Tolerating meds now. Patient was administered Spravato 84 mg intranasally today.  The patient experienced the typical dissociation which gradually resolved over the 2-hour period of observation.  There were no complications.  Specifically the patient did not have nausea or vomiting or headache.   Trouble staying asleep longer than 6 hours.  Plan:  Continue sertaline to 200 mg daily.  It has helped anxiety but not depression.   for insomnia, clonazepam 1 mg HS. trazodone doesn't work well but helps some Continue Dayvigo 10 HS.  Failed resp Belsomra 15 and seroquel 25 She wants to continue Cotempla or Concerta bc helps mood early in day and focus  benefit lasts longer.  Have not been able to get stimulant to be consitently effective throughout the day. continue pramipexole trial off-label and reduce back to  TRD 0.5 mg BID Spravato weekly.  10/13/22 appt noted: Not sleeping as well.  Wants to increase Spravato to twice weekly bc feeling more dep close to the next sched date.  Just the day or 2 before.  .  Plan: Trial for off label TRD increase pramipexole 1 mg TID  10/20/22 appt noted: Psych meds:  Clonazepam 0.5 mg tablets 2 nightly, gabapentin dosage varies, Dayvigo 10 mg nightly, trazodone 100 mg nightly,  sertaline 15o mg . Returned to Cotempla AM, pramipexole  1 mg TID. Tolerating meds now. Patient was administered Spravato 84 mg intranasally today.  The patient experienced the typical dissociation which gradually resolved over the 2-hour period of observation.  There were no complications.  Specifically the patient did not have nausea or vomiting or headache.   Trouble staying asleep longer than 6 hours.  More dep this week and asked to get Spravato twice this week. Plan: Trial for off label TRD increase pramipexole 1 mg QID  10/22/22 appt noted:  Psych meds:  Clonazepam 0.5 mg tablets 2 nightly and another one with EMA, gabapentin dosage  varies, Belsomra 20 mg daily,  trazodone 100 mg nightly,  sertaline 15o mg . Returned to Cotempla AM, pramipexole  1 mg QID for 2nd day Tolerating meds now.  Except a little N and dizzy briefly after takes it. Patient was administered Spravato 84 mg intranasally today.  The patient experienced the typical dissociation which gradually resolved over the 2-hour period of observation.  There were no  complications.  Specifically the patient did not have nausea or vomiting or headache. No change in mood yet.   Sleep better with Belsomra 20 as long as takes with other meds.  If insomnia mood is worse. Ongoing some dep but gets partial relief with Spravato but would like better relief .  10/27/22 received Spravato 84  11/03/22 appt noted:  Meds as above except sertraline she cut to 100 mg instead of 200 mg daily She doesn't think sertraline helping and worries over poss SE Off and on Cotempla .  Not affecting BP. Doesn't notice benefit or SE with pramipexole 1 mg QID.  No SE Sleep is OK.  Some awakening aftter 6 hours but not enough so takes another clonazepam.   Patient was administered Spravato 84 mg intranasally today.  The patient experienced the typical dissociation which gradually resolved over the 2-hour period of observation.  There were no complications.  Specifically the patient did not have nausea or vomiting or headache. No change in mood yet.   Sleep better with Belsomra 20 as long as takes with other meds.  If insomnia mood is worse. Ongoing some dep but gets partial relief with Spravato but would like better relief .  No sig benefit with pramipexole 1 mg QID.  11/21/22 TC:  To ER with dep 11/21/22. No SE with selegiline but feels more dep.  Wants to stop it. Today is only day 2 of 1 tablet of selegiline 5 mg AM.  Reviewed with her that she had not been satisfied with the response of Spravato and Zoloft and that she is likely feeling worse because the Zoloft is wearing off and in particular because she abruptly stopped 150 mg daily.  She needs to give the selegiline time to work.  She agrees to do so.  She admitted to some passive suicidal thoughts but no active suicidal thoughts and no desire to die.  She agrees to the plan to increase selegiline to 10 mg every morning this weekend and to continue the selegiline trial.  She cannot stop this and immediately start another AD due to risk  DDI with serotonin syndrome.  She is aware.  Meredith Staggers, MD, DFAPA  12/19/22 TC: Pt called at 1:42p.  She said she wanted to know if she could start weaning off the Selegiline.  She thinks increasing the dosage is making things worse.  She said she wants to go back to [employer].  She acknowleged she has an appt on Monday. I advised her that I wasn't sure Dr Jennelle Human will see her message before her appt, but she still ask me to send the message.  Next appt 8/26 MD resp.  No change until Monday appt.  12/22/22 appt noted: Meds: selegiline up to 5 mg 4 daily for a week but felt it incr irritability and went back to 3 mg daily. She wants to go back to Spravato.  Not near as dep as now.  Selegiline didn't help energy. Still on Belsomra Thinks maybe sertraline helped mood more than she thought. More dep than anxiety.   Plan: DC  selegiline and will retry Effexor which helped in the past at 300 mg daily.  Start this Friday after over 10 half lives of selegiline.    12/24/22 appt noted: Stopped selegiline  Psych med: belsomra 20, clonazepam 1 mg HS.   She has felt more dep off the Spravato though at the time didn't think it was helping.  Has felt more down and withdrawn.  Low energy, motivation, not smiling.  No SI.  Sleep ok.   Patient was administered Spravato 84 mg intranasally today.  The patient experienced the typical dissociation which gradually resolved over the 2-hour period of observation.  There were no complications.  Specifically the patient did not have nausea or vomiting or headache. No problems with BP or other unusual SE Mood is a little better after this administration of Spravato.  Very motivated to continue it.  No immediate med concerns. No SI .  Ongoing anhedonia.  12/25/22 appt noted: Psych med: belsomra 20, clonazepam 1 mg HS.  Not resumed stimulant or AD yet   No SE. Patient was administered Spravato 84 mg intranasally today.  The patient experienced the typical dissociation  which gradually resolved over the 2-hour period of observation.  There were no complications.  Specifically the patient did not have nausea or vomiting or headache.  There is a lot things going on in her life and it is somewhat difficult to separate stressors from what is going on with her mood.  However she is happy to resume Spravato as she decided after stopping briefly that it was helpful.  She plans to start the antidepressant Morrow.  She also wants to resume the stimulant tomorrow.  12/30/22 appt noted; Psych med: belsomra 20, clonazepam 1 mg HS.  Not resumed stimulant.  Effexor XR 37.5 mg daily   No SE. Patient was administered Spravato 84 mg intranasally today.  The patient experienced the typical dissociation which gradually resolved over the 2-hour period of observation.  There were no complications.  Specifically the patient did not have nausea or vomiting or headache.  She is still having depression with very prominent anhedonia.  However she feels the Spravato is somewhat helpful and is hopeful for more complete response as she gets back onto a reasonable antidepressant dose.  She tolerated Effexor XR 300 mg daily in the past and took it for 5 months and it is probably the antidepressants worked best for her.  She is agreeable with this plan.  She is getting sleep benefit from the current medications Plan: incr Effexor 75 mg daily  01/02/23 appt noted: Psych meds as noted above except increase Effexor XR to 75 mg daily without side effects. Patient was administered Spravato 84 mg intranasally today.  The patient experienced the typical dissociation which gradually resolved over the 2-hour period of observation.  There were no complications.  Specifically the patient did not have nausea or vomiting or headache.  Agrees to increasing Effexor as planned.  SX not changed from that noted above.  No SI  01/05/23 appt noted: Psych meds as noted above except increase Effexor XR to 112.5 mg daily  without side effects. Patient was administered Spravato 84 mg intranasally today.  The patient experienced the typical dissociation which gradually resolved over the 2-hour period of observation.  There were no complications.  Specifically the patient did not have nausea or vomiting or headache.  Agrees to increasing Effexor as planned gradually to 300 mg daily.  SX not changed from that noted above.  Does  however feel Spravato has dampened the degree of depression.  No SI Stressful life event discussed and won't be resolved until early next year.  01/07/23 appt noted: Psych meds Effexor XR 112.5 mg daily. Patient was administered Spravato 84 mg intranasally today.  The patient experienced the typical dissociation which gradually resolved over the 2-hour period of observation.  There were no complications.  Specifically the patient did not have nausea or vomiting or headache.  Agrees to increasing Effexor as planned gradually to 300 mg daily.  SX not changed from that noted above.  Does however feel Spravato has dampened the degree of depression markedly compared to when off psych meds and no Spravato.  No SI  01/12/23 appt noted; Psych meds Effexor XR 225 mg daily. No SE notedl. Patient was administered Spravato 84 mg intranasally today.  The patient experienced the typical dissociation which gradually resolved over the 2-hour period of observation.  There were no complications.  Specifically the patient did not have nausea or vomiting.  But is having headache.  Agrees to increasing Effexor as planned gradually to 300 mg daily.  SX not changed from that noted above.  Does however feel Spravato has dampened the degree of depression markedly compared to when off psych meds and no Spravato.  No SI  01/15/23 appt noted:  Psych meds Effexor XR 225 mg daily. No SE noted. Patient was administered Spravato 84 mg intranasally today.  The patient experienced the typical dissociation which gradually resolved  over the 2-hour period of observation.  There were no complications.  Specifically the patient did not have nausea or vomiting.   Doing ok with higher doses of Effexor and will plan to increase to 300 mg daily. Mild improvement noticed so far with the incrase in meds.   Uses Cotempla prn and it helps but doesn't liek the way she feels when it wears off.   AIMS    Flowsheet Row Video Visit from 01/03/2022 in [facility]  AIMS Total Score 0      ECT-MADRS    Flowsheet Row Clinical Support from 08/13/2022 in [facility] Video Visit from 04/29/2022 in [facility] Video Visit from 02/06/2022 in [facility]  MADRS Total Score 27 44 23      GAD-7    Flowsheet Row Counselor from 12/19/2022 in [facility] Office Visit from 08/22/2022 in [facility] [place]  Total GAD-7 Score 4 7      PHQ2-9    Flowsheet Row Counselor from 12/19/2022 in [facility] Office Visit from 08/22/2022 in [facility] [place] Office Visit from 08/19/2022 in [facility] at [facility] Video Visit from 02/06/2022 in [facility] Office Visit from 11/16/2020 in [facility] at [facility] Total Score 2 3 0 6 0  PHQ-9 Total Score 5 5 -- 14 --        Past Psychiatric Medication Trials: Trintellix - Initially effective and then was not effective when re-started Paxil- Ineffective. Helped initially. Prozac-Insomnia Lexapro- Effective and then no longer as effective Zoloft- Initially effective and then minimal response Viibryd Cymbalta- Ineffective. Helped initially. Effexor XR 300- Effective, well tolerated. Pristiq- Increased anxiety at 150 mg daily Wellbutrin XL-Ineffective.May have increased  anxiety. Amitriptyline Nortriptyline-Caused irritability, constipation Auvelity- ineffective Selegilne brief 20 mg daily , more irritable  Abilify- Helpful but caused severe insomnia. Rexulti-Helpful but caused insomnia. Vraylar Seroquel - Ineffective Risperdal Olanzapine- Ineffective Latuda- Ineffective Caplyta  Klonopin- Effective Temazepam- Took during menopause Lunesta- Ineffective Ambien-Ineffective Sonata- Ineffective Dayvigo- partially effective Failed resp Belsomra 15 and seroquel 25 Trazodone-  somewhat effective Remeron-Ineffective. Does not recall wt gain.  Quetiapine 25 NR  Adderall- Took once and had adverse effect. Concerta- Effective but only 4-6 hours Jornay 80 NR Metadate-Not as effective as Concerta Dexmethylphenidate- Not as effective as Concerta Azstarys- some side effects. Not as effective.  Ritalin short acting and SE anxiety & HTN @20  TID Cotemplay 17.3  Daytrana  Lamictal- May have had an adverse effects. "Felt weird." Reports worsening s/s.  Lithium ? effect Gabapentin Pramipexole- 1 mg QID NR  ECT #20 with minimal response. H says it was partly helpful.  AIMS    Flowsheet Row Video Visit from 01/03/2022 in [facility]  AIMS Total Score 0      ECT-MADRS    Flowsheet Row Clinical Support [facility]13/20[facility] Video Visit from 04/29/2022 in [facility] Video Visit from 02/06/2022 in [facility]  MADRS Total Score 27 44 23      GAD-7    Flowsheet Row Counselor from 12/19/2022 in [facility] Office Visit from 08/22/2022 in [facility] [place]  Total GAD-7 Score 4 7      PHQ2-9    Flowsheet Row Counselor from 12/19/2022 in [facility] Office Visit from 08/22/2022 in [facility] [place] Office Visit from  08/19/2022 in [facility] at [facility] Video Visit from 02/06/2022 in [facility] Office Visit from 11/16/2020 in [facility] at [facility]  PHQ-2 Total Score 2 3 0 6 0  PHQ-9 Total Score 5 5 -- 14 --        Review of Systems:  Review of Systems  Cardiovascular:  Negative for palpitations.  Neurological:  Positive for headaches. Negative for dizziness and tremors.  Psychiatric/Behavioral:  Positive for dysphoric mood and sleep disturbance. Negative for agitation, decreased concentration and suicidal ideas. The patient is nervous/anxious.     Medications: I have reviewed the patient's current medications.  Current Outpatient Medications  Medication Sig Dispense Refill   clonazePAM (KLONOPIN) 0.5 MG tablet 2 tablets at night and 1 tablet as needed daily for panic anxiety (Patient taking differently: Take 1 mg by mouth at bedtime.) 75 tablet 1   Esketamine HCl, 84 MG Dose, (SPRAVATO, 84 MG DOSE,) 28 MG/DEVICE SOPK Place 84 mg into the nose every 3 (three) days. 3 each 5   Estradiol (VAGIFEM) 10 MCG TABS vaginal tablet Place 1 tablet (10 mcg total) vaginally 2 (two) times a week. 24 tablet 3   lithium carbonate (LITHOBID) 300 MG ER tablet Take 1 tablet (300 mg total) by mouth at bedtime. 90 tablet 0   Suvorexant (BELSOMRA) 20 MG TABS TAKE 1 TABLET BY MOUTH AT BEDTIME 30 tablet 2   valACYclovir (VALTREX) 500 MG tablet Take one twice daily for 3 days with any symptoms 30 tablet 3   valsartan (DIOVAN) 80 MG tablet Take 1 tablet (80 mg total) by mouth daily. 90 tablet 3   venlafaxine XR (EFFEXOR-XR) 75 MG 24 hr capsule Take 3 capsules (225 mg total) by mouth daily. 270 capsule 0   No current facility-administered medications  for this visit.    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   TRANSAMINASES, SERUM, ELEVATED 08/14/2009    Past Medical History, Surgical history, Social history, and Family history were reviewed and updated as appropriate.   Please see review of systems for further details on the patient's review from today.   Objective:   Physical Exam:  LMP 12/27/2007 (Exact Date)   Physical Exam Constitutional:      General: She is not in acute distress. Musculoskeletal:        General: No deformity.  Neurological:     Mental Status: She is alert and oriented to person, place, and time.     Coordination: Coordination normal.  Psychiatric:        Attention and Perception: Attention and perception normal. She does not perceive auditory hallucinations.        Mood and Affect: Mood is anxious and depressed. Affect is blunt.        Speech: Speech normal. Speech is not slurred.        Behavior: Behavior normal.        Thought Content: Thought content normal. Thought content is not delusional. Thought content does not include homicidal or suicidal ideation. Thought content does not include suicidal plan.        Cognition and Memory: Cognition and memory normal.        Judgment: Judgment normal.     Comments: Insight intact Mood still mod dep but better with Spravato.  She is doing better overall. Affect blunted without change     Lab Review:     Component Value Date/Time   NA 132 (L) 10/27/2022 1107   K 4.4 10/27/2022 1107   CL 95 (L) 10/27/2022 1107   CO2 30 10/27/2022 1107   GLUCOSE 89 10/27/2022 1107   BUN 14 10/27/2022 1107   CREATININE 0.67 10/27/2022 1107   CREATININE 0.68 01/26/2020 0920   CALCIUM 10.1 10/27/2022 1107   PROT 8.3 04/22/2022 0932   ALBUMIN 5.3 (H) 04/22/2022 0932   AST 82 (H)  04/22/2022 0932   ALT 118 (H) 04/22/2022 0932   ALKPHOS 74 04/22/2022 0932   BILITOT 0.3 04/22/2022 0932   GFRNONAA >90 05/01/2014 1645   GFRAA >90 05/01/2014 1645       Component Value Date/Time   WBC 3.3 (L) 04/22/2022 0932   RBC 4.25 04/22/2022 0932   HGB 14.3 04/22/2022 0932   HGB 14.1 10/26/2014 1034   HCT 40.7 04/22/2022 0932   PLT 245.0 04/22/2022 0932   MCV 95.7 04/22/2022 0932   MCH 34.4 (H) 01/26/2020 0920   MCHC 35.1 04/22/2022 0932   RDW 13.2 04/22/2022 0932   LYMPHSABS 0.9 04/22/2022 0932   MONOABS 0.2 04/22/2022 0932   EOSABS 0.1 04/22/2022 0932   BASOSABS 0.0 04/22/2022 0932    No results found for: "POCLITH", "LITHIUM"   No results found for: "PHENYTOIN", "PHENOBARB", "VALPROATE", "CBMZ"   .res Assessment: Plan:    Buff was seen today for follow-up, depression, add and sleeping problem.  Diagnoses and all orders for this visit:  Recurrent major depression resistant to treatment (HCC) -     lithium carbonate (LITHOBID) 300 MG ER tablet; Take 1 tablet (300 mg total) by mouth at bedtime.  Generalized anxiety disorder  Attention deficit hyperactivity disorder (ADHD), predominantly inattentive type  Insomnia, unspecified type      Pt seen for TRD with multiple failed medications as noted above.  Some of these failures have been related to an adequate dose and duration as she has had a tendency to get frustrated with medication trials.TRD need 6 week trial of AD at full dose at least.   She has had no problems with the transition which has been started from selegiline to Effexor XR.  No SE thus far.  Patient was administered Spravato 84 mg intranasally today.  The patient experienced the typical dissociation which gradually resolved over the 2-hour period of observation.  There were no complications.  Specifically the patient did not have nausea or vomiting or headache.  Blood pressures remained within normal ranges at the 40-minute and 2-hour  follow-up intervals.  By the time the 2-hour observation period was met the patient was alert and oriented and able to exit without assistance.  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  See nursing note for further details.  Consider switch Spravato to MAOI, Parnate  We discussed the short-term risks associated with benzodiazepines including sedation and increased fall risk among others.  Discussed long-term side effect risk including dependence, potential withdrawal symptoms, and the potential eventual dose-related risk of dementia.  But recent studies from 2020 dispute this association between benzodiazepines and dementia risk. Newer studies in 2020 do not support an association with dementia.  for insomnia, clonazepam 1 mg HS.  Some better so far with Belsomra 20 mg HS  Cotempla or Concerta bc helps mood early in day and focus  benefit lasts longer.  Have not been able to get stimulant to be consitently effective throughout the day. Conisder switching to patch bc pt has significant crash with stimulants.  increased venlafaxine XR to 225 mg this week.  She will increase to XR 300 mg daily next week.  This was a dose that she took and tolerated in the past.  Disc pretreating Spravato with NSAID or Tylenol bc of HA lately.  Supportive therapy dealing with serious stressful life event.  FU twice weekly Spravato until next week then try to drop back to weekly .  Meredith Staggers, MD, DFAPA    Please see After Visit Summary for patient specific instructions.  Future Appointments  Date Time Provider Department Center  01/20/2023 10:00 AM Gaspar Bidding, [facility] None  01/29/2023 11:00 AM Gaspar Bidding, [facility] None  02/09/2023  1:00 PM Gaspar Bidding, [facility] None  02/16/2023  1:00 PM Gaspar Bidding, [facility] CP-CP None  02/23/2023  1:00 PM Gaspar Bidding, [facility] CP-CP  None  03/02/2023  2:00 PM Gaspar Bidding, [facility] CP[facility]CP None  09/18/2023  8:15 AM Jerene Bears, MD [facility]    No orders of the defined types were placed in this encounter.   -------------------------------

## 2023-01-15 NOTE — Progress Notes (Signed)
NURSES NOTE:           Patient arrived for her #51 Spravato treatment. Pt is coming twice a week currently after a change in medication for 1 month. Pt is being treated for Treatment Resistant Depression, pt will be receiving 84 mg (3 of the 28 mg) nasal sprays, this is her maintenance dose. Patient taken to treatment room, I explained and discussed her treatment and how the schedule should go, along with any side effects that may occur. Answered any questions and concerns the patient had. Pt's Spravato is delivered through [place] and she is now billing with her insurance. Spravato medication is stored at doctors office per REMS/FDA guidelines. The medication is required to be locked behind two doors per FDA/REMS Protocol. Medication is also disposed of properly per regulations. All treatments and her vital signs are documented in Spravato REMS per protocol of treatment center regulations. [facility] was notified pt is restarting, and will need her Spravato delivered again on a regular basis. No new PA is needed at this time.      Vital Signs assessed at 8:00 AM 150/79, pulse 60, pulse ox 92%. Instructed pt to blow her nose and recline back slightly to prevent any of medication dripping out of her nose.   Pt. Given 1st dose (28 mg inhaler), then 5 minutes between 2nd dose and 3rd dose for total of 84 mg. No complaints of nausea/vomiting reported. Pt did listen to music today, she just reclined back and closed her eyes. After 40 minutes I went to assess her vital signs, no side effects or symptoms reported, B/P 136/80, pulse 66. Dr. Jennelle Human talked to pt today about her treatment. Nurse was with pt a total of 60 minutes but pt observed for 120 minutes per protocol. Discharge vital signs were at 10:00 AM, B/P 127/82, pulse 74. She verbalized understanding. She reports dissociation but she was clear upon her discharge. Pt will start coming once a week starting next week. She is scheduled on Thursday,  September 26th. She is instructed to contact office if needing anything prior to her next treatment.     LOT # [id number]   EXP APR 2027

## 2023-01-19 ENCOUNTER — Other Ambulatory Visit: Payer: Self-pay | Admitting: Psychiatry

## 2023-01-19 DIAGNOSIS — G4700 Insomnia, unspecified: Secondary | ICD-10-CM

## 2023-01-19 DIAGNOSIS — F419 Anxiety disorder, unspecified: Secondary | ICD-10-CM

## 2023-01-20 ENCOUNTER — Ambulatory Visit: Payer: 59 | Admitting: Professional Counselor

## 2023-01-20 ENCOUNTER — Encounter: Payer: Self-pay | Admitting: Professional Counselor

## 2023-01-20 DIAGNOSIS — F339 Major depressive disorder, recurrent, unspecified: Secondary | ICD-10-CM

## 2023-01-20 DIAGNOSIS — F411 Generalized anxiety disorder: Secondary | ICD-10-CM

## 2023-01-20 NOTE — Progress Notes (Signed)
      [facility] Counselor/Therapist Progress Note  Patient ID: Heather Davila, MRN: [id number],    Date: 01/20/2023  Time Spent: 10:05a - 11:06a   Treatment Type: Individual Therapy  Reported Symptoms: grief/loss, sadness, tearfulness, low self esteem, low hope  Mental Status Exam:  Appearance:   Neat     Behavior:  Appropriate and Sharing  Motor:  Normal  Speech/Language:   Clear and Coherent and Normal Rate  Affect:  Sad  Mood:  Tearful, depressed  Thought process:  normal  Thought content:    WNL  Sensory/Perceptual disturbances:    WNL  Orientation:  oriented to person, place, time/date, and situation  Attention:  Good  Concentration:  Good  Memory:  WNL  Fund of knowledge:   Good  Insight:    Good  Judgment:   Good  Impulse Control:  Good   Risk Assessment: Danger to Self:  No Self-injurious Behavior: No Danger to Others: No Duty to Warn:no Physical Aggression / Violence:No  Access to Firearms a concern: No  Gang Involvement:No   Subjective: Heather Davila presented to session voicing frustration and sense of gloom regarding her depression. She identified wishing for immediate relief. Counselor and Heather Davila discussed therapy process and counselor's orientation, and counselor shared options for additional or alternate treatment including EMDR and brainspotting, and gave Heather Davila referral information should she wish to consider. Heather Davila processed grief from mother's loss. She identified missing her mother while also having longed for more of her mother's presence, love and affection while she was living; counselor held space for Heather Davila grieving and helped Heather Davila resource self compassion. They discussed dialectical thinking and holding different truths and feelings at once, and normalizing. Heather Davila processed sense of self worth as relates mother's needs and expectations. Counselor encouraged self awareness around rest that is self supportive and soothing.  Interventions: Solution-Oriented/Positive Psychology,  Humanistic/Existential, and Insight-Oriented, Somatic Psychotherapy, DBT  Diagnosis:   ICD-10-CM   1. 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 Generalized anxiety disorder  F41.1       Plan: Heather Davila is scheduled for a follow-up; continue process work and developing coping skills.  Gaspar Bidding, [facility]

## 2023-01-22 ENCOUNTER — Ambulatory Visit: Payer: 59

## 2023-01-22 ENCOUNTER — Ambulatory Visit (INDEPENDENT_AMBULATORY_CARE_PROVIDER_SITE_OTHER): Payer: 59 | Admitting: Psychiatry

## 2023-01-22 VITALS — BP 128/77 | HR 75

## 2023-01-22 DIAGNOSIS — G4700 Insomnia, unspecified: Secondary | ICD-10-CM | POA: Diagnosis not present

## 2023-01-22 DIAGNOSIS — F900 Attention-deficit hyperactivity disorder, predominantly inattentive type: Secondary | ICD-10-CM | POA: Diagnosis not present

## 2023-01-22 DIAGNOSIS — F411 Generalized anxiety disorder: Secondary | ICD-10-CM

## 2023-01-22 DIAGNOSIS — F339 Major depressive disorder, recurrent, unspecified: Secondary | ICD-10-CM

## 2023-01-22 DIAGNOSIS — Z6379 Other stressful life events affecting family and household: Secondary | ICD-10-CM

## 2023-01-23 ENCOUNTER — Other Ambulatory Visit: Payer: Self-pay

## 2023-01-23 DIAGNOSIS — F339 Major depressive disorder, recurrent, unspecified: Secondary | ICD-10-CM

## 2023-01-23 MED ORDER — SPRAVATO (84 MG DOSE) 28 MG/DEVICE NA SOPK
84.0000 mg | PACK | NASAL | 5 refills | Status: DC
Start: 2023-01-23 — End: 2023-03-02

## 2023-01-25 NOTE — Progress Notes (Signed)
NURSES NOTE:           Patient arrived for her #52 Spravato treatment. Pt is coming once a week now. Pt is being treated for Treatment Resistant Depression, pt will be receiving 84 mg (3 of the 28 mg) nasal sprays, this is her maintenance dose. Patient taken to treatment room, I explained and discussed her treatment and how the schedule should go, along with any side effects that may occur. Answered any questions and concerns the patient had. Pt's Spravato is delivered through [place] and she is now billing with her insurance. Spravato medication is stored at doctors office per REMS/FDA guidelines. The medication is required to be locked behind two doors per FDA/REMS Protocol. Medication is also disposed of properly per regulations. All treatments and her vital signs are documented in Spravato REMS per protocol of treatment center regulations. [facility] delivers her medication.      Vital Signs assessed at 1:05 PM 138/83, pulse 69, pulse ox 92%. Instructed pt to blow her nose and recline back slightly to prevent any of medication dripping out of her nose.   Pt. Given 1st dose (28 mg inhaler), then 5 minutes between 2nd dose and 3rd dose for total of 84 mg. No complaints of nausea/vomiting reported. Pt did listen to music today, she just reclined back and closed her eyes. After 40 minutes I went to assess her vital signs, no side effects or symptoms reported, B/P 142/79, pulse 66. Dr. Jennelle Human talked to pt today about her treatment. Nurse was with pt a total of 60 minutes but pt observed for 120 minutes per protocol. Discharge vital signs were at 3:00 PM, B/P 128/77, pulse 75. She verbalized understanding. She reports dissociation but she was clear upon her discharge. She is scheduled next week. She is instructed to contact office if needing anything prior to her next treatment.     LOT # [id number]   EXP APR 2027

## 2023-01-26 ENCOUNTER — Encounter: Payer: Self-pay | Admitting: Psychiatry

## 2023-01-26 NOTE — Progress Notes (Signed)
Heather Davila [id number] Jan 09, 1961 62 y.o.  Subjective:   Patient ID:  Heather Davila is a 62 y.o. (DOB 1961-01-03) female.  Chief Complaint:  Chief Complaint  Patient presents with  . Follow-up  . Depression  . Anxiety  . Stress  . Sleeping Problem  . ADD    HPI Heather Davila presents to the office today for follow-up of treatment resistant recurrent depression, generalized anxiety disorder and insomnia.  Almost too numerous to count med failures. She is here for Spravato administration for her treatment resistant depression.  05/21/22 appt noted: Patient was administered first dose Spravato 56 mg intranasally today.  The patient experienced the typical dissociation which gradually resolved over the 2-hour period of observation.  There were no complications.  Specifically the patient did not have nausea or vomiting or headache.   Dissociation was mild. Did not experience any emotional relief from disabling depression.wants to increase Spravato to the usual dose.  She is aware insurance is not covering the costs at this time.  No SI acutely. Tolerating meds.    05/23/22 appt noted: Patient was administered Spravato 84 mg intranasally today.  The patient experienced the typical dissociation which gradually resolved over the 2-hour period of observation.  There were no complications.  Specifically the patient did not have nausea or vomiting.   Dissociation was greater than last dose and a little bothersome DT some HA. Tolerating meds.   Mood has not changed significantly thus far with Spravato.  05/27/22 appt noted: Patient was administered Spravato 84 mg intranasally today.  The patient experienced the typical dissociation which gradually resolved over the 2-hour period of observation.  There were no complications.  Specifically the patient did not have nausea or vomiting or headache. Today she has felt a little relief from the pressing heaviness of depression but a little more  anxiety.  During administration of Spravato she listens to [place] music and was able to relax a little more with the feeling of dissociation that was mildly bothersome. Husband was also in on the session today and asked some questions about IV ketamine versus nasal spray ketamine.  05/29/22 appt noted: Cancelled today DT hypertension  05/30/22 appt noted: Patient was administered Spravato 84 mg intranasally today.  The patient experienced the typical dissociation which gradually resolved over the 2-hour period of observation.  There were no complications.  Specifically the patient did not have nausea or vomiting or headache. She is seeing some improvement in mood with less continuous depression and less intensity.  Hopefulness is better.   No med complaints or SE  06/05/22 appt noted: Patient was administered Spravato 84 mg intranasally today.  The patient experienced the typical dissociation which gradually resolved over the 2-hour period of observation.  There were no complications.  Specifically the patient did not have nausea or vomiting or headache.  Specifically HA is less of a problem with Spravato. No SE with meds. Depression is improving with less intensity.  Intermittent anxiety easily.  More able to enjoy things.  No new concerns.  06/17/22 appt noted: Patient was administered Spravato 84 mg intranasally today.  The patient experienced the typical dissociation which gradually resolved over the 2-hour period of observation.  There were no complications.  Specifically the patient did not have nausea or vomiting or headache.  Specifically HA is less of a problem with Spravato. No SE with meds. Depression is improved 45% with less intensity.  Intermittent anxiety less easily.  More able to enjoy things.  No new  concerns.  More active. Current meds: increased clonazepam to about 1.5 mg HS DT recent insomnia, lexapro 20, Dayvigo 10 mg HS, concerta 36 mg AM, trazodone 100 mg HS.  06/20/22 appt  noted: Current meds: increased clonazepam to about 1.5 mg HS DT recent insomnia, lexapro 20, Dayvigo 10 mg HS, concerta 36 mg AM, trazodone 100 mg HS. Patient was administered Spravato 84 mg intranasally today.  The patient experienced the typical dissociation which gradually resolved over the 2-hour period of observation.  There were no complications.  Specifically the patient did not have nausea or vomiting or headache.  Specifically HA is less of a problem with Spravato. No SE with meds. Depression is improved 45% with less intensity.  Intermittent anxiety less easily.  More able to enjoy things.  No new concerns.  More active.  06/23/22 appt noted:  Current meds: increased clonazepam to about 1.5 mg HS DT recent insomnia, lexapro 20, Dayvigo 10 mg HS, concerta 36 mg AM, trazodone 100 mg HS. Patient was administered Spravato 84 mg intranasally today.  The patient experienced the typical dissociation which gradually resolved over the 2-hour period of observation.  There were no complications.  Specifically the patient did not have nausea or vomiting or headache.  Specifically HA is less of a problem with Spravato. No SE with meds. Intensity of Spravato dissociation varies.  Last week very intesne and this week more typical.  Depression is continuing to improve with better interest and activity and motivation.  Gradual improvement.  Wants to continue.  06/25/22 appt noted: Current meds: increased clonazepam to about 1.5 mg HS DT recent insomnia, lexapro 20, Dayvigo 10 mg HS, concerta 36 mg AM, trazodone 100 mg HS. Patient was administered Spravato 84 mg intranasally today.  The patient experienced the typical dissociation which gradually resolved over the 2-hour period of observation.  There were no complications.  Specifically the patient did not have nausea or vomiting or headache.  Specifically HA is less of a problem with Spravato. No SE with meds. Intensity of Spravato dissociation varies.  No  scary and well tolerated. Continues to gradually improve with Spravato.  She is more motivated and enjoying things more.  H sees progress.  Wants to continue twice weekly until gets maximum response.  Dep is not gone yet.  06/30/22 appt noted:  seen with H Current meds: increased clonazepam to about 1.5 mg HS DT recent insomnia, lexapro 20, Dayvigo 10 mg HS, concerta 36 mg AM, trazodone 100 mg HS. Patient was administered Spravato 84 mg intranasally today.  The patient experienced the typical dissociation which gradually resolved over the 2-hour period of observation.  There were no complications.  Specifically the patient did not have nausea or vomiting or headache.  Specifically HA is less of a problem with Spravato. No SE with meds. Intensity of Spravato dissociation varies.  No scary and well tolerated. Continues to gradually improve with Spravato.  She is more motivated and enjoying things more.  H sees even more progress than she does; more active and smiling.  Wants to continue twice weekly until gets maximum response.  Dep is 80% better.  07/07/22 appt noted: Current meds: increased clonazepam to about 1.5 mg HS DT recent insomnia, lexapro 20, Dayvigo 10 mg HS, concerta 36 mg AM, trazodone 100 mg HS. Patient was administered Spravato 84 mg intranasally today.  The patient experienced the typical dissociation which gradually resolved over the 2-hour period of observation.  There were no complications.  Specifically the patient  did not have nausea or vomiting or headache.  Specifically HA is less of a problem with Spravato. No SE with meds. Intensity of Spravato dissociation varies.  No scary and well tolerated.  Was more intese today. Overall mood is markedly better and please with meds. No changes desired.  07/09/22 appt noted: In transition from Lexapro to sertraline and missing Concerta bc CO crash from it after about 4 hours.  Disc these concerns.  She'll discuss also with Corie Chiquito,  NP More dep the last couple of days and concerned about reducing Spravato frequency while transition from one antidep to another.  Affect less positive. Patient was administered Spravato 84 mg intranasally today.  The patient experienced the typical dissociation which gradually resolved over the 2-hour period of observation.  There were no complications.  Specifically the patient did not have nausea or vomiting or headache.  Specifically HA is less of a problem with Spravato. No SE with meds. Intensity of Spravato dissociation varies.  No scary and well tolerated.   07/14/22 appt noted: Has been more depressed this week.  More trouble with sleep.  Taking clonazepam 1-1.5  of the 0.5 mg tablets.   Trazodone not helping much. She switched back from 200 sertraline to Lexapro 20.  No SE More trouble with motivation.  Some stress with son with special needs. Misses the benevit of Concerta but it was too short acting and crashed in 4-6 hours. Patient was administered Spravato 84 mg intranasally today.  The patient experienced the typical dissociation which gradually resolved over the 2-hour period of observation.  There were no complications.  Specifically the patient did not have nausea or vomiting or headache.  Specifically HA is less of a problem with Spravato. No SE with meds. Intensity of Spravato dissociation varies.  No scary and well tolerated.   07/17/22 appt noted: Patient was administered Spravato 84 mg intranasally today.  The patient experienced the typical dissociation which gradually resolved over the 2-hour period of observation.  There were no complications.  Specifically the patient did not have nausea or vomiting or headache.  Specifically HA is less of a problem with Spravato. No SE with meds. Intensity of Spravato dissociation varies.  Not scary and well tolerated.  Just got Jornay last night but wasn't sure how to take it.  Wants to start and this was discussed.  She felt MPH helped  mood and attn but didn't last long enough and had crash after a few hours on Concerta.  Better for 2 hours after each dose Ritalin 20 mg with mood but then it wore off.  BP up after 3rd dose 160/90 and then took H's amlodipine 5 and BP became normal.  07/22/22 appt noted: Patient was administered Spravato 84 mg intranasally today.  The patient experienced the typical dissociation which gradually resolved over the 2-hour period of observation.  There were no complications.  Specifically the patient did not have nausea or vomiting or headache.  Specifically HA is less of a problem with Spravato. No SE with meds. Intensity of Spravato dissociation varies.  Not scary and well tolerated.  Start Jornay 20 mg over the weekend and did not notice any particular effect good or bad.  Increased to 40 mg. Other psych meds: Clonazepam 0.5 mg tablets 2 nightly, gabapentin dosage varies, Dayvigo 10 mg nightly, Lexapro 20 mg daily, to trazodone 100 mg nightly  07/24/22 appt noted: Patient was administered Spravato 84 mg intranasally today.  The patient experienced the typical dissociation  which gradually resolved over the 2-hour period of observation.  There were no complications.  Specifically the patient did not have nausea or vomiting or headache.  Specifically HA is less of a problem with Spravato. No SE with meds. Intensity of Spravato dissociation varies.  Not scary and well tolerated.  Other psych meds: Clonazepam 0.5 mg tablets 2 nightly, gabapentin dosage varies, Dayvigo 10 mg nightly, Lexapro 20 mg daily, to trazodone 100 mg nightly, Jornay 40 mg PM Doing well with Spravato.  Noticed a little effect from the Jornay 40 without SE but would like to increase the dose for ADD and mood.  Sleeping well.  No new concerns.  Still more depressed than she was with th initial benefit of Spravato.  07/30/22 note:  Patient was administered Spravato 84 mg intranasally today.  The patient experienced the typical  dissociation which gradually resolved over the 2-hour period of observation.  There were no complications.  Specifically the patient did not have nausea or vomiting or headache.  Specifically HA is less of a problem with Spravato. Other psych meds: Clonazepam 0.5 mg tablets 2 nightly, gabapentin dosage varies, Dayvigo 10 mg nightly, Lexapro 20 mg daily, to trazodone 100 mg nightly, Jornay 60 mg PM Wants to increase Jornay to 80 mg PM to increase benefit for ADD and depression. No SE She has experienced a major family crisis that threatens to change her daily life from now on.  It has not triggered suicidal thoughts at this time but she is very afraid.  No desire to change medications.  08/04/22 appt noted" Patient was administered Spravato 84 mg intranasally today.  The patient experienced the typical dissociation which gradually resolved over the 2-hour period of observation.  There were no complications.  Specifically the patient did not have nausea or vomiting or headache.  Specifically HA is less of a problem with Spravato. Other psych meds: Clonazepam 0.5 mg tablets 2 nightly, gabapentin dosage varies, Dayvigo 10 mg nightly, Lexapro 20 mg daily, trazodone 100 mg nightly, Jornay 80 mg PM No SE No benefit or SE with increase Jormay 80 mg.  Says Concerta helped ADD and depression but not Jornay.  However duration was insufficient.  Still depressed and wants change to another form of stimulant.  No concerns with other meds. Continues with strong family stressors creating uncertainty about her future but no quick resolution. No SI Trial Cotempla 17.3 and DC Ritalin & Jornay 80  08/11/2022 appointment noted: Patient was administered Spravato 84 mg intranasally today.  The patient experienced the typical dissociation which gradually resolved over the 2-hour period of observation.  There were no complications.  Specifically the patient did not have nausea or vomiting or headache.  Specifically HA is less  of a problem with Spravato now vs when started it.. Other psych meds: Clonazepam 0.5 mg tablets 2 nightly, gabapentin dosage varies, Dayvigo 10 mg nightly, trazodone 100 mg nightly, cotempla 17.3, switch from Lexapro 20 to sertaline 15o mg . No noticeable effects sertraline from for depression but has seen some antianxiety effects from the sertraline.  No side effects noted.  No side effects with the other medicines.  Still is only getting very brief benefit 3 to 4 hours from Cotempla for ADD and mood.  She wants to try the Daytrana patch as we have discussed before. No SE  08/13/22 appt: Patient was administered Spravato 84 mg intranasally today.  The patient experienced the typical dissociation which gradually resolved over the 2-hour period of observation.  There were no complications.  Specifically the patient did not have nausea or vomiting or headache.  Specifically HA is less of a problem with Spravato now vs when started it.. Other psych meds: Clonazepam 0.5 mg tablets 2 nightly, gabapentin dosage varies, Dayvigo 10 mg nightly, trazodone 100 mg nightly, cotempla 17.3, switch from Lexapro 20 to sertaline 15o mg . No noticeable effects sertraline from for depression but has seen some antianxiety effects from the sertraline.  No side effects noted.  No side effects with the other medicines.  Still is only getting very brief benefit 3 to 4 hours from Cotempla for ADD and mood.  She wants to try the Daytrana patch as we have discussed before.  Hasn't been able to get it yet. No SE Plan.  Continue to try to get Daytrana 30 AM  08/18/22 appt noted: Patient was administered Spravato 84 mg intranasally today.  The patient experienced the typical dissociation which gradually resolved over the 2-hour period of observation.  There were no complications.  Specifically the patient did not have nausea or vomiting or headache.  Specifically HA is less of a problem with Spravato now vs when started it.. Other  psych meds: Clonazepam 0.5 mg tablets 2 nightly, gabapentin dosage varies, Dayvigo 10 mg nightly, trazodone 100 mg nightly, cotempla 17.3, switch from Lexapro 20 to sertaline 15o mg . No noticeable effects sertraline from for depression but has seen some antianxiety effects from the sertraline.  No side effects noted.  No side effects with the other medicines.  Still is only getting very brief benefit 3 to 4 hours from Cotempla for ADD and mood.  She wants to try the Daytrana patch as we have discussed before.  Hasn't been able to get it yet.  Overall depression is some better than it was.   No SE Plan.  Continue to try to get Daytrana 30 AM  08/25/22 appt : No benefit Daytrana.  No SE.  Wants further med changes and interested In retrying pramipexole.   Tolerating meds. Patient was administered Spravato 84 mg intranasally today.  The patient experienced the typical dissociation which gradually resolved over the 2-hour period of observation.  There were no complications.  Specifically the patient did not have nausea or vomiting or headache.  Specifically HA is less of a problem with Spravato now vs when started it.. Other psych meds: Clonazepam 0.5 mg tablets 2 nightly, gabapentin dosage varies, Dayvigo 10 mg nightly, trazodone 100 mg nightly, , switch from Lexapro 20 to sertaline 15o mg . Daytrana 30 AM for a few days. Struggles with depression and no SI.  Reduced interest and mood and motivation and enjoyment and socialization.  08/27/22 appt noted: Psych meds:  Clonazepam 0.5 mg tablets 2 nightly, gabapentin dosage varies, Dayvigo 10 mg nightly, trazodone 100 mg nightly,  sertaline 15o mg . Returned to [employer] AM, pramipexole  Tolerating meds. Patient was administered Spravato 84 mg intranasally today.  The patient experienced the typical dissociation which gradually resolved over the 2-hour period of observation.  There were no complications.  Specifically the patient did not have nausea or vomiting  or headache.   Reports taking cotempla bc brief mood benefit and sig focus and productivity benefit. Took 1 dose pramipexole and felt more dep.  09/01/22 appt noted: Psych meds:  Clonazepam 0.5 mg tablets 2 nightly, gabapentin dosage varies, Dayvigo 10 mg nightly, trazodone 100 mg nightly,  sertaline 15o mg . Returned to Cotempla AM, pramipexole  0.25 mg BID. Tolerating  meds now. Patient was administered Spravato 84 mg intranasally today.  The patient experienced the typical dissociation which gradually resolved over the 2-hour period of observation.  There were no complications.  Specifically the patient did not have nausea or vomiting or headache.   Willing to try the pramipexole and will continue 0.25 mg BID this week and then consider increase if tolerated.  Mood is a little better.  Still looking for further improvement.  09/03/22 appt noted: Psych meds:  Clonazepam 0.5 mg tablets 2 nightly, gabapentin dosage varies, Dayvigo 10 mg nightly, trazodone 100 mg nightly,  sertaline 15o mg . Returned to Cotempla AM, pramipexole  0.25 mg tablet 1 and 1/2 tablets twice daily BID. Tolerating meds now. Patient was administered Spravato 84 mg intranasally today.  The patient experienced the typical dissociation which gradually resolved over the 2-hour period of observation.  There were no complications.  Specifically the patient did not have nausea or vomiting or headache.   Depression may be a little better with the parmipexole and is tolerating it well so far.  Still struggling with focus and residual depression.  Tolerating meds without SE.  More hopeful and able to enjoy some things.  09/08/22 appt  Psych meds:  Clonazepam 0.5 mg tablets 2 nightly, gabapentin dosage varies, Dayvigo 10 mg nightly, trazodone 100 mg nightly,  sertaline 15o mg . Returned to Cotempla AM, pramipexole  0.5 mg BID. Tolerating meds now. Patient was administered Spravato 84 mg intranasally today.  The patient experienced the  typical dissociation which gradually resolved over the 2-hour period of observation.  There were no complications.  Specifically the patient did not have nausea or vomiting or headache.   She has seen her depression drop from a 10/10 prior to treatment with Spravato to now 4/10 With additional benefit noted when she increased the pramipexole to 0.5 mg twice daily.  She is not having side effects with this like she did in the past. Anxiety levels are also improved. She is sleeping okay with the current medications. Plan: continue pramipexole trial off-label and increase more slowly for TRD.  Increase  Gradually  to 0.75 mg BID  09/11/22 appt noted: Psych meds:  Clonazepam 0.5 mg tablets 2 nightly, gabapentin dosage varies, Dayvigo 10 mg nightly, trazodone 100 mg nightly,  sertaline 15o mg . Returned to Cotempla AM, pramipexole  0.75 mg BID. Tolerating meds now. Patient was administered Spravato 84 mg intranasally today.  The patient experienced the typical dissociation which gradually resolved over the 2-hour period of observation.  There were no complications.  Specifically the patient did not have nausea or vomiting or headache.   She has seen her depression drop from a 10/10 prior to treatment with Spravato to now 4/10 With additional benefit noted when she increased the pramipexole to 0.5 mg twice daily.  She is not having side effects with this like she did in the past. Clearly improving with the pramipexole now and tolerating it.  Satisfied with current meds but would consider increasing if it might be helpful.  09/15/22 appt noted: Psych meds:  Clonazepam 0.5 mg tablets 2 nightly, gabapentin dosage varies, Dayvigo 10 mg nightly, trazodone 100 mg nightly,  sertaline 15o mg . Returned to Cotempla AM, pramipexole  0.75 mg BID too anxious and reduced to 0.5 mg BID. Tolerating meds now. Patient was administered Spravato 84 mg intranasally today.  The patient experienced the typical dissociation  which gradually resolved over the 2-hour period of observation.  There were no  complications.  Specifically the patient did not have nausea or vomiting or headache.   Trouble staying asleep longer than 6 hours.  Doesn't feel she can tolerate pramipexole 0.75 mg BID DT anxiety not experienced at  0.5 mg BID.  Otherwise tolerating meds.  Mood is better with Spravato and pramipexole.   Not sleeping as long with meds.  EMA 6 hours.  Wonders about change Disc concerns about waiting for therapy. Feels ready to reduce Spravato to weekly.    09/25/22 appt noted: Psych meds:  Clonazepam 0.5 mg tablets 2 nightly, gabapentin dosage varies, Dayvigo 10 mg nightly, trazodone 100 mg nightly,  sertaline 15o mg . Returned to Cotempla AM, pramipexole  0.75 mg BID too anxious and reduced to 0.5 mg BID. Tolerating meds now. Patient was administered Spravato 84 mg intranasally today.  The patient experienced the typical dissociation which gradually resolved over the 2-hour period of observation.  There were no complications.  Specifically the patient did not have nausea or vomiting or headache.   Trouble staying asleep longer than 6 hours.  Failed to respond to Seroquel 25 mg HS.  Gone back to [facility].  Belsomra NR. Mood ok until the last few days more dep bc it's been 10 days since last admin.  Wants to continue weekly.   No SE.  No med changes desired.  09/29/22 appt noted: Psych meds:  Clonazepam 0.5 mg tablets 2 nightly, gabapentin dosage varies, Dayvigo 10 mg nightly, trazodone 100 mg nightly,  sertaline 15o mg . Returned to Cotempla AM, pramipexole  0.75 mg BID too anxious and reduced to 0.5 mg BID. Tolerating meds now. Patient was administered Spravato 84 mg intranasally today.  The patient experienced the typical dissociation which gradually resolved over the 2-hour period of observation.  There were no complications.  Specifically the patient did not have nausea or vomiting or headache.   Trouble staying asleep  longer than 6 hours.  Plan: Continue sertaline to 200 mg daily.  It has helped anxiety but not depression.   for insomnia, clonazepam 1 mg HS. trazodone doesn't work well but helps some Continue Dayvigo 10 HS.  Failed resp Belsomra 15 and seroquel 25 She wants to continue Cotempla or Concerta bc helps mood early in day and focus  benefit lasts longer.  Have not been able to get stimulant to be consitently effective throughout the day. continue pramipexole trial off-label and reduce back to  TRD 0.5 mg BID Spravato weekly.  10/13/22 appt noted: Not sleeping as well.  Wants to increase Spravato to twice weekly bc feeling more dep close to the next sched date.  Just the day or 2 before.  .  Plan: Trial for off label TRD increase pramipexole 1 mg TID  10/20/22 appt noted: Psych meds:  Clonazepam 0.5 mg tablets 2 nightly, gabapentin dosage varies, Dayvigo 10 mg nightly, trazodone 100 mg nightly,  sertaline 15o mg . Returned to Cotempla AM, pramipexole  1 mg TID. Tolerating meds now. Patient was administered Spravato 84 mg intranasally today.  The patient experienced the typical dissociation which gradually resolved over the 2-hour period of observation.  There were no complications.  Specifically the patient did not have nausea or vomiting or headache.   Trouble staying asleep longer than 6 hours.  More dep this week and asked to get Spravato twice this week. Plan: Trial for off label TRD increase pramipexole 1 mg QID  10/22/22 appt noted:  Psych meds:  Clonazepam 0.5 mg tablets 2 nightly and  another one with EMA, gabapentin dosage varies, Belsomra 20 mg daily,  trazodone 100 mg nightly,  sertaline 15o mg . Returned to Cotempla AM, pramipexole  1 mg QID for 2nd day Tolerating meds now.  Except a little N and dizzy briefly after takes it. Patient was administered Spravato 84 mg intranasally today.  The patient experienced the typical dissociation which gradually resolved over the 2-hour period of  observation.  There were no complications.  Specifically the patient did not have nausea or vomiting or headache. No change in mood yet.   Sleep better with Belsomra 20 as long as takes with other meds.  If insomnia mood is worse. Ongoing some dep but gets partial relief with Spravato but would like better relief .  10/27/22 received Spravato 84  11/03/22 appt noted:  Meds as above except sertraline she cut to 100 mg instead of 200 mg daily She doesn't think sertraline helping and worries over poss SE Off and on Cotempla .  Not affecting BP. Doesn't notice benefit or SE with pramipexole 1 mg QID.  No SE Sleep is OK.  Some awakening aftter 6 hours but not enough so takes another clonazepam.   Patient was administered Spravato 84 mg intranasally today.  The patient experienced the typical dissociation which gradually resolved over the 2-hour period of observation.  There were no complications.  Specifically the patient did not have nausea or vomiting or headache. No change in mood yet.   Sleep better with Belsomra 20 as long as takes with other meds.  If insomnia mood is worse. Ongoing some dep but gets partial relief with Spravato but would like better relief .  No sig benefit with pramipexole 1 mg QID.  11/21/22 TC:  To ER with dep 11/21/22. No SE with selegiline but feels more dep.  Wants to stop it. Today is only day 2 of 1 tablet of selegiline 5 mg AM.  Reviewed with her that she had not been satisfied with the response of Spravato and Zoloft and that she is likely feeling worse because the Zoloft is wearing off and in particular because she abruptly stopped 150 mg daily.  She needs to give the selegiline time to work.  She agrees to do so.  She admitted to some passive suicidal thoughts but no active suicidal thoughts and no desire to die.  She agrees to the plan to increase selegiline to 10 mg every morning this weekend and to continue the selegiline trial.  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  She is aware.  Meredith Staggers, MD, DFAPA  12/19/22 TC: Pt called at 1:42p.  She said she wanted to know if she could start weaning off the Selegiline.  She thinks increasing the dosage is making things worse.  She said she wants to go back to [employer].  She acknowleged she has an appt on Monday. I advised her that I wasn't sure Dr Jennelle Human will see her message before her appt, but she still ask me to send the message.  Next appt 8/26 MD resp.  No change until Monday appt.  12/22/22 appt noted: Meds: selegiline up to 5 mg 4 daily for a week but felt it incr irritability and went back to 3 mg daily. She wants to go back to Spravato.  Not near as dep as now.  Selegiline didn't help energy. Still on Belsomra Thinks maybe sertraline helped mood more than she thought. More dep  than anxiety.   Plan: DC selegiline and will retry Effexor which helped in the past at 300 mg daily.  Start this Friday after over 10 half lives of selegiline.    12/24/22 appt noted: Stopped selegiline  Psych med: belsomra 20, clonazepam 1 mg HS.   She has felt more dep off the Spravato though at the time didn't think it was helping.  Has felt more down and withdrawn.  Low energy, motivation, not smiling.  No SI.  Sleep ok.   Patient was administered Spravato 84 mg intranasally today.  The patient experienced the typical dissociation which gradually resolved over the 2-hour period of observation.  There were no complications.  Specifically the patient did not have nausea or vomiting or headache. No problems with BP or other unusual SE Mood is a little better after this administration of Spravato.  Very motivated to continue it.  No immediate med concerns. No SI .  Ongoing anhedonia.  12/25/22 appt noted: Psych med: belsomra 20, clonazepam 1 mg HS.  Not resumed stimulant or AD yet   No SE. Patient was administered Spravato 84 mg intranasally today.  The patient experienced  the typical dissociation which gradually resolved over the 2-hour period of observation.  There were no complications.  Specifically the patient did not have nausea or vomiting or headache.  There is a lot things going on in her life and it is somewhat difficult to separate stressors from what is going on with her mood.  However she is happy to resume Spravato as she decided after stopping briefly that it was helpful.  She plans to start the antidepressant Morrow.  She also wants to resume the stimulant tomorrow.  12/30/22 appt noted; Psych med: belsomra 20, clonazepam 1 mg HS.  Not resumed stimulant.  Effexor XR 37.5 mg daily   No SE. Patient was administered Spravato 84 mg intranasally today.  The patient experienced the typical dissociation which gradually resolved over the 2-hour period of observation.  There were no complications.  Specifically the patient did not have nausea or vomiting or headache.  She is still having depression with very prominent anhedonia.  However she feels the Spravato is somewhat helpful and is hopeful for more complete response as she gets back onto a reasonable antidepressant dose.  She tolerated Effexor XR 300 mg daily in the past and took it for 5 months and it is probably the antidepressants worked best for her.  She is agreeable with this plan.  She is getting sleep benefit from the current medications Plan: incr Effexor 75 mg daily  01/02/23 appt noted: Psych meds as noted above except increase Effexor XR to 75 mg daily without side effects. Patient was administered Spravato 84 mg intranasally today.  The patient experienced the typical dissociation which gradually resolved over the 2-hour period of observation.  There were no complications.  Specifically the patient did not have nausea or vomiting or headache.  Agrees to increasing Effexor as planned.  SX not changed from that noted above.  No SI  01/05/23 appt noted: Psych meds as noted above except increase Effexor  XR to 112.5 mg daily without side effects. Patient was administered Spravato 84 mg intranasally today.  The patient experienced the typical dissociation which gradually resolved over the 2-hour period of observation.  There were no complications.  Specifically the patient did not have nausea or vomiting or headache.  Agrees to increasing Effexor as planned gradually to 300 mg daily.  SX not changed  from that noted above.  Does however feel Spravato has dampened the degree of depression.  No SI Stressful life event discussed and won't be resolved until early next year.  01/07/23 appt noted: Psych meds Effexor XR 112.5 mg daily. Patient was administered Spravato 84 mg intranasally today.  The patient experienced the typical dissociation which gradually resolved over the 2-hour period of observation.  There were no complications.  Specifically the patient did not have nausea or vomiting or headache.  Agrees to increasing Effexor as planned gradually to 300 mg daily.  SX not changed from that noted above.  Does however feel Spravato has dampened the degree of depression markedly compared to when off psych meds and no Spravato.  No SI  01/12/23 appt noted; Psych meds Effexor XR 225 mg daily. No SE notedl. Patient was administered Spravato 84 mg intranasally today.  The patient experienced the typical dissociation which gradually resolved over the 2-hour period of observation.  There were no complications.  Specifically the patient did not have nausea or vomiting.  But is having headache.  Agrees to increasing Effexor as planned gradually to 300 mg daily.  SX not changed from that noted above.  Does however feel Spravato has dampened the degree of depression markedly compared to when off psych meds and no Spravato.  No SI  01/15/23 appt noted:  Psych meds Effexor XR 225 mg daily. No SE noted. Patient was administered Spravato 84 mg intranasally today.  The patient experienced the typical dissociation which  gradually resolved over the 2-hour period of observation.  There were no complications.  Specifically the patient did not have nausea or vomiting.   Doing ok with higher doses of Effexor and will plan to increase to 300 mg daily. Mild improvement noticed so far with the incrase in meds.   Uses Cotempla prn and it helps but doesn't liek the way she feels when it wears off.  01/22/23 appt noted: Psych meds reduced Effexor XR 150 mg daily. Bc of HA No SE noted. Patient was administered Spravato 84 mg intranasally today.  The patient experienced the typical dissociation which gradually resolved over the 2-hour period of observation.  There were no complications.  Specifically the patient did not have nausea or vomiting.  Ongoing moderate levels of depression with some improvement from the Spravato.  She is not sure about the effect of the venlafaxine on her mood yet.  She would like to continue to try pursuing a stimulant but has had difficulty tolerating p.o. versions of stimulants because of the crash at the end and side effects with many of them.  She has several questions about this.  AIMS    Flowsheet Row Video Visit from 01/03/2022 in [facility]  AIMS Total Score 0      ECT-MADRS    Flowsheet Row Clinical Support from 08/13/2022 in [facility] Video Visit from 04/29/2022 in [facility] Video Visit from 02/06/2022 in [facility]  MADRS Total Score 27 44 23      GAD-7    Flowsheet Row Counselor from 12/19/2022 in [facility] Office Visit from 08/22/2022 in [facility] [place]  Total GAD-7 Score 4 7      PHQ2-9    Flowsheet Row Counselor from 12/19/2022 in [facility] Office Visit from 08/22/2022 in [facility] [place] Office Visit from 08/19/2022 in [facility]  [facility]  [employer] [facility] [employer] Video Visit from 02/06/2022 in [facility] Office Visit from 11/16/2020 in [facility] for [facility]  PHQ-2 Total Score 2 3 0 6 0  PHQ-9 Total Score 5 5 -- 14 --        Past Psychiatric Medication Trials: Trintellix - Initially effective and then was not effective when re-started Paxil- Ineffective. Helped initially. Prozac-Insomnia Lexapro- Effective and then no longer as effective Zoloft- Initially effective and then minimal response Viibryd Cymbalta- Ineffective. Helped initially. Effexor XR 300- Effective, well tolerated. Pristiq- Increased anxiety at 150 mg daily Wellbutrin XL-Ineffective.May have increased anxiety. Amitriptyline Nortriptyline-Caused irritability, constipation Auvelity- ineffective Selegilne brief 20 mg daily , more irritable  Abilify- Helpful but caused severe insomnia. Rexulti-Helpful but caused insomnia. Vraylar Seroquel - Ineffective Risperdal Olanzapine- Ineffective Latuda- Ineffective Caplyta  Klonopin- Effective Temazepam- Took during menopause Lunesta- Ineffective Ambien-Ineffective Sonata- Ineffective Dayvigo- partially effective Failed resp Belsomra 15 and seroquel 25 Trazodone-  somewhat effective Remeron-Ineffective. Does not recall wt gain.  Quetiapine 25 NR  Adderall- Took once and had adverse effect. Concerta- Effective but only 4-6 hours Jornay 80 NR Metadate-Not as effective as Concerta Dexmethylphenidate- Not as effective as Concerta Azstarys- some side effects. Not as effective.  Ritalin short acting and SE anxiety & HTN @20  TID Cotemplay 17.3  Daytrana  Lamictal- May have had an adverse effects. "Felt weird." Reports worsening s/s.  Lithium ? effect Gabapentin Pramipexole- 1 mg QID NR  ECT #20 with minimal response. H says it was partly helpful.  AIMS    Flowsheet Row Video Visit from 01/03/2022 in [facility]  AIMS Total Score 0      ECT-MADRS    Flowsheet Row Clinical Support from 08/13/2022 in [facility] Video Visit from 04/29/2022 in [facility] Video Visit from 02/06/2022 in [facility]  MADRS Total Score 27 44 23      GAD-7    Flowsheet Row Counselor from 12/19/2022 in [facility] Office Visit from 08/22/2022 in [facility] [place]  Total GAD-7 Score 4 7      PHQ2-9    Flowsheet Row Counselor from 12/19/2022 in [facility] Office Visit from 08/22/2022 in [facility] [place] Office Visit from 08/19/2022 in [facility] for [facility] Video Visit from 02/06/2022 in [facility] Office Visit from 11/16/2020 in [facility]  PHQ-2 Total Score 2 3 0 6 0  PHQ-9 Total Score 5 5 -- 14 --        Review of Systems:  Review of Systems  Cardiovascular:  Negative for chest pain and palpitations.  Neurological:  Positive for headaches. Negative for dizziness and tremors.  Psychiatric/Behavioral:  Positive for dysphoric mood and sleep disturbance. Negative for agitation, decreased concentration and suicidal ideas. The patient is nervous/anxious.     Medications: I have reviewed the patient's current medications.  Current Outpatient Medications  Medication Sig Dispense Refill  . clonazePAM (KLONOPIN) 0.5 MG tablet TAKE 2 TABLETS BY MOUTH AT NIGHT AND 1 TABLET AS NEEDED DAILY PANIC  ANXIETY 75 tablet 0  . Esketamine HCl, 84 MG Dose, (SPRAVATO, 84 MG DOSE,) 28 MG/DEVICE SOPK Place 84 mg into the nose every 7 (seven) days. 3 each 5  . Estradiol (VAGIFEM)  10 MCG TABS vaginal tablet Place 1 tablet (10 mcg total) vaginally 2 (two) times a week. 24 tablet 3  . lithium  carbonate (LITHOBID) 300 MG ER tablet Take 1 tablet (300 mg total) by mouth at bedtime. 90 tablet 0  . Suvorexant (BELSOMRA) 20 MG TABS TAKE 1 TABLET BY MOUTH AT BEDTIME 30 tablet 2  . valACYclovir (VALTREX) 500 MG tablet Take one twice daily for 3 days with any symptoms 30 tablet 3  . valsartan (DIOVAN) 80 MG tablet Take 1 tablet (80 mg total) by mouth daily. 90 tablet 3  . venlafaxine XR (EFFEXOR-XR) 75 MG 24 hr capsule Take 3 capsules (225 mg total) by mouth daily. (Patient taking differently: Take 150 mg by mouth daily.) 270 capsule 0   No current facility-administered medications for this visit.    Medication Side Effects: None  Allergies:  Allergies  Allergen Reactions  . Ciprofloxacin Hives  . Oxycodone-Acetaminophen Hives    Past Medical History:  Diagnosis Date  . Adult acne   . Anemia   . Anorexia nervosa teen  . DEPRESSION 08/09/2009  . Hematoma 09/2020   post op after face lift  . Insomnia   . 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  . TRANSAMINASES, SERUM, ELEVATED 08/14/2009    Past Medical History, Surgical history, Social history, and Family history were reviewed and updated as appropriate.   Please see review of systems for further details on the patient's review from today.   Objective:   Physical Exam:  LMP 12/27/2007 (Exact Date)   Physical Exam Constitutional:      General: She is not in acute distress. Musculoskeletal:        General: No deformity.  Neurological:     Mental Status: She is alert and oriented to person, place, and time.     Coordination: Coordination normal.  Psychiatric:        Attention and Perception: Attention and perception normal. She does not perceive auditory hallucinations.        Mood and Affect: Mood is anxious and depressed. Affect is blunt.        Speech: Speech normal.        Behavior: Behavior normal.        Thought Content: Thought content normal. Thought content is  not delusional. Thought content does not include homicidal or suicidal ideation. Thought content does not include suicidal plan.        Cognition and Memory: Cognition and memory normal.        Judgment: Judgment normal.     Comments: Insight intact Mood still mod dep but better with Spravato.  She is doing better overall. Affect blunted without change    Lab Review:     Component Value Date/Time   NA 132 (L) 10/27/2022 1107   K 4.4 10/27/2022 1107   CL 95 (L) 10/27/2022 1107   CO2 30 10/27/2022 1107   GLUCOSE 89 10/27/2022 1107   BUN 14 10/27/2022 1107   CREATININE 0.67 10/27/2022 1107   CREATININE 0.68 01/26/2020 0920   CALCIUM 10.1 10/27/2022 1107   PROT 8.3 04/22/2022 0932   ALBUMIN 5.3 (H) 04/22/2022 0932   AST 82 (H) 04/22/2022 0932   ALT 118 (H) 04/22/2022 0932   ALKPHOS 74 04/22/2022 0932   BILITOT 0.3 04/22/2022 0932   GFRNONAA >90 05/01/2014 1645   GFRAA >90 05/01/2014 1645       Component Value Date/Time   WBC  3.3 (L) 04/22/2022 0932   RBC 4.25 04/22/2022 0932   HGB 14.3 04/22/2022 0932   HGB 14.1 10/26/2014 1034   HCT 40.7 04/22/2022 0932   PLT 245.0 04/22/2022 0932   MCV 95.7 04/22/2022 0932   MCH 34.4 (H) 01/26/2020 0920   MCHC 35.1 04/22/2022 0932   RDW 13.2 04/22/2022 0932   LYMPHSABS 0.9 04/22/2022 0932   MONOABS 0.2 04/22/2022 0932   EOSABS 0.1 04/22/2022 0932   BASOSABS 0.0 04/22/2022 0932    No results found for: "POCLITH", "LITHIUM"   No results found for: "PHENYTOIN", "PHENOBARB", "VALPROATE", "CBMZ"   .res Assessment: Plan:    Heather Davila was seen today for follow-up, depression, anxiety, stress, sleeping problem and add.  Diagnoses and all orders for this visit:  Recurrent major depression resistant to treatment [facility])  Generalized anxiety disorder  Attention deficit hyperactivity disorder (ADHD), predominantly inattentive type  Insomnia, unspecified type  Stressful life event affecting family   Pt seen for TRD with multiple  failed medications as noted above.  Some of these failures have been related to an adequate dose and duration as she has had a tendency to get frustrated with medication trials.TRD need 6 week trial of AD at full dose at least.   She has had no problems with the transition which has been started from selegiline to Effexor XR. But doesn't think she can tolerate 300 mg Effexor right now bc HA developed.  Patient was administered Spravato 84 mg intranasally today.  The patient experienced the typical dissociation which gradually resolved over the 2-hour period of observation.  There were no complications.  Specifically the patient did not have nausea or vomiting or headache.  Blood pressures remained within normal ranges at the 40-minute and 2-hour follow-up intervals.  By the time the 2-hour observation period was met the patient was alert and oriented and able to exit without assistance.  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  See nursing note for further details.  Consider switch Spravato to MAOI, Parnate  We discussed the short-term risks associated with benzodiazepines including sedation and increased fall risk among others.  Discussed long-term side effect risk including dependence, potential withdrawal symptoms, and the potential eventual dose-related risk of dementia.  But recent studies from 2020 dispute this association between benzodiazepines and dementia risk. Newer studies in 2020 do not support an association with dementia.  for insomnia, clonazepam 1 mg HS.  Some better so far with Belsomra 20 mg HS  Cotempla or Concerta bc helps mood early in day and focus  benefit lasts longer.  Have not been able to get stimulant to be consitently effective throughout the day. Conisder switching to patch bc pt has significant crash with stimulants.  Yes will try Jornay or patch at next visit.  increased  venlafaxine XR to 225 mg this week then reduced to 150 bc HA.    Disc pretreating Spravato with NSAID or Tylenol bc of HA lately.  Supportive therapy dealing with serious stressful life event.  FU twice weekly Spravato until next week then try to drop back to weekly .  Meredith Staggers, MD, DFAPA    Please see After Visit Summary for patient specific instructions.  Future Appointments  Date Time Provider Department Center  01/27/2023  1:00 PM Cottle, Steva Ready., MD [facility] None  01/27/2023  1:00 PM CP-NURSE [facility] None  01/29/2023 11:00 AM Gaspar Bidding, [facility] None  02/04/2023  1:00 PM Cottle, Steva Ready., MD [facility] None  02/04/2023  1:00 PM CP-NURSE CP[facility] None  02/09/2023  1:00 PM Gaspar Bidding, [facility] CP-CP None  02/16/2023  1:00 PM Gaspar Bidding, [facility] CP-CP None  02/23/2023  1:00 PM Gaspar Bidding, [facility] CP-[facility] None  03/02/2023  2:00 PM Gaspar Bidding, [facility] CP-CP None  03/11/2023  9:00 AM Gaspar Bidding, [facility] CP-CP None  09/18/2023  8:15 AM Jerene Bears, MD [facility]    No orders of the defined types were placed in this encounter.   -------------------------------

## 2023-01-27 ENCOUNTER — Ambulatory Visit: Payer: 59

## 2023-01-27 ENCOUNTER — Ambulatory Visit (INDEPENDENT_AMBULATORY_CARE_PROVIDER_SITE_OTHER): Payer: 59 | Admitting: Psychiatry

## 2023-01-27 ENCOUNTER — Encounter: Payer: Self-pay | Admitting: Psychiatry

## 2023-01-27 VITALS — BP 130/78 | HR 75

## 2023-01-27 DIAGNOSIS — F900 Attention-deficit hyperactivity disorder, predominantly inattentive type: Secondary | ICD-10-CM

## 2023-01-27 DIAGNOSIS — F339 Major depressive disorder, recurrent, unspecified: Secondary | ICD-10-CM

## 2023-01-27 DIAGNOSIS — F411 Generalized anxiety disorder: Secondary | ICD-10-CM | POA: Diagnosis not present

## 2023-01-27 DIAGNOSIS — Z6379 Other stressful life events affecting family and household: Secondary | ICD-10-CM

## 2023-01-27 DIAGNOSIS — G4700 Insomnia, unspecified: Secondary | ICD-10-CM

## 2023-01-27 MED ORDER — TEMAZEPAM 30 MG PO CAPS
30.0000 mg | ORAL_CAPSULE | Freq: Every evening | ORAL | 0 refills | Status: DC | PRN
Start: 2023-01-27 — End: 2023-02-17

## 2023-01-27 MED ORDER — METHYLPHENIDATE 30 MG/9HR TD PTCH
1.0000 | MEDICATED_PATCH | Freq: Every day | TRANSDERMAL | 0 refills | Status: DC
Start: 2023-01-27 — End: 2023-02-23

## 2023-01-27 NOTE — Progress Notes (Signed)
Adianna Darwin [id number] [birth year] 62 y.o.  Subjective:   Patient ID:  Heather Davila is a 62 y.o. (DOB 1960-10-14) female.  Chief Complaint:  Chief Complaint  Patient presents with   Follow-up   Depression   Stress   ADD   Sleeping Problem    HPI Heather Davila presents to the office today for follow-up of treatment resistant recurrent depression, generalized anxiety disorder and insomnia.  Almost too numerous to count med failures. She is here for Spravato administration for her treatment resistant depression.  05/21/22 appt noted: Patient was administered first dose Spravato 56 mg intranasally today.  The patient experienced the typical dissociation which gradually resolved over the 2-hour period of observation.  There were no complications.  Specifically the patient did not have nausea or vomiting or headache.   Dissociation was mild. Did not experience any emotional relief from disabling depression.wants to increase Spravato to the usual dose.  She is aware insurance is not covering the costs at this time.  No SI acutely. Tolerating meds.    05/23/22 appt noted: Patient was administered Spravato 84 mg intranasally today.  The patient experienced the typical dissociation which gradually resolved over the 2-hour period of observation.  There were no complications.  Specifically the patient did not have nausea or vomiting.   Dissociation was greater than last dose and a little bothersome DT some HA. Tolerating meds.   Mood has not changed significantly thus far with Spravato.  05/27/22 appt noted: Patient was administered Spravato 84 mg intranasally today.  The patient experienced the typical dissociation which gradually resolved over the 2-hour period of observation.  There were no complications.  Specifically the patient did not have nausea or vomiting or headache. Today she has felt a little relief from the pressing heaviness of depression but a little more anxiety.  During  administration of Spravato she listens to [place] music and was able to relax a little more with the feeling of dissociation that was mildly bothersome. Husband was also in on the session today and asked some questions about IV ketamine versus nasal spray ketamine.  05/29/22 appt noted: Cancelled today DT hypertension  05/30/22 appt noted: Patient was administered Spravato 84 mg intranasally today.  The patient experienced the typical dissociation which gradually resolved over the 2-hour period of observation.  There were no complications.  Specifically the patient did not have nausea or vomiting or headache. She is seeing some improvement in mood with less continuous depression and less intensity.  Hopefulness is better.   No med complaints or SE  06/05/22 appt noted: Patient was administered Spravato 84 mg intranasally today.  The patient experienced the typical dissociation which gradually resolved over the 2-hour period of observation.  There were no complications.  Specifically the patient did not have nausea or vomiting or headache.  Specifically HA is less of a problem with Spravato. No SE with meds. Depression is improving with less intensity.  Intermittent anxiety easily.  More able to enjoy things.  No new concerns.  06/17/22 appt noted: Patient was administered Spravato 84 mg intranasally today.  The patient experienced the typical dissociation which gradually resolved over the 2-hour period of observation.  There were no complications.  Specifically the patient did not have nausea or vomiting or headache.  Specifically HA is less of a problem with Spravato. No SE with meds. Depression is improved 45% with less intensity.  Intermittent anxiety less easily.  More able to enjoy things.  No new concerns.  More  active. Current meds: increased clonazepam to about 1.5 mg HS DT recent insomnia, lexapro 20, Dayvigo 10 mg HS, concerta 36 mg AM, trazodone 100 mg HS.  06/20/22 appt noted: Current  meds: increased clonazepam to about 1.5 mg HS DT recent insomnia, lexapro 20, Dayvigo 10 mg HS, concerta 36 mg AM, trazodone 100 mg HS. Patient was administered Spravato 84 mg intranasally today.  The patient experienced the typical dissociation which gradually resolved over the 2-hour period of observation.  There were no complications.  Specifically the patient did not have nausea or vomiting or headache.  Specifically HA is less of a problem with Spravato. No SE with meds. Depression is improved 45% with less intensity.  Intermittent anxiety less easily.  More able to enjoy things.  No new concerns.  More active.  06/23/22 appt noted:  Current meds: increased clonazepam to about 1.5 mg HS DT recent insomnia, lexapro 20, Dayvigo 10 mg HS, concerta 36 mg AM, trazodone 100 mg HS. Patient was administered Spravato 84 mg intranasally today.  The patient experienced the typical dissociation which gradually resolved over the 2-hour period of observation.  There were no complications.  Specifically the patient did not have nausea or vomiting or headache.  Specifically HA is less of a problem with Spravato. No SE with meds. Intensity of Spravato dissociation varies.  Last week very intesne and this week more typical.  Depression is continuing to improve with better interest and activity and motivation.  Gradual improvement.  Wants to continue.  06/25/22 appt noted: Current meds: increased clonazepam to about 1.5 mg HS DT recent insomnia, lexapro 20, Dayvigo 10 mg HS, concerta 36 mg AM, trazodone 100 mg HS. Patient was administered Spravato 84 mg intranasally today.  The patient experienced the typical dissociation which gradually resolved over the 2-hour period of observation.  There were no complications.  Specifically the patient did not have nausea or vomiting or headache.  Specifically HA is less of a problem with Spravato. No SE with meds. Intensity of Spravato dissociation varies.  No scary and well  tolerated. Continues to gradually improve with Spravato.  She is more motivated and enjoying things more.  H sees progress.  Wants to continue twice weekly until gets maximum response.  Dep is not gone yet.  06/30/22 appt noted:  seen with H Current meds: increased clonazepam to about 1.5 mg HS DT recent insomnia, lexapro 20, Dayvigo 10 mg HS, concerta 36 mg AM, trazodone 100 mg HS. Patient was administered Spravato 84 mg intranasally today.  The patient experienced the typical dissociation which gradually resolved over the 2-hour period of observation.  There were no complications.  Specifically the patient did not have nausea or vomiting or headache.  Specifically HA is less of a problem with Spravato. No SE with meds. Intensity of Spravato dissociation varies.  No scary and well tolerated. Continues to gradually improve with Spravato.  She is more motivated and enjoying things more.  H sees even more progress than she does; more active and smiling.  Wants to continue twice weekly until gets maximum response.  Dep is 80% better.  07/07/22 appt noted: Current meds: increased clonazepam to about 1.5 mg HS DT recent insomnia, lexapro 20, Dayvigo 10 mg HS, concerta 36 mg AM, trazodone 100 mg HS. Patient was administered Spravato 84 mg intranasally today.  The patient experienced the typical dissociation which gradually resolved over the 2-hour period of observation.  There were no complications.  Specifically the patient did not have  nausea or vomiting or headache.  Specifically HA is less of a problem with Spravato. No SE with meds. Intensity of Spravato dissociation varies.  No scary and well tolerated.  Was more intese today. Overall mood is markedly better and please with meds. No changes desired.  07/09/22 appt noted: In transition from Lexapro to sertraline and missing Concerta bc CO crash from it after about 4 hours.  Disc these concerns.  She'll discuss also with Corie Chiquito, NP More dep the  last couple of days and concerned about reducing Spravato frequency while transition from one antidep to another.  Affect less positive. Patient was administered Spravato 84 mg intranasally today.  The patient experienced the typical dissociation which gradually resolved over the 2-hour period of observation.  There were no complications.  Specifically the patient did not have nausea or vomiting or headache.  Specifically HA is less of a problem with Spravato. No SE with meds. Intensity of Spravato dissociation varies.  No scary and well tolerated.   07/14/22 appt noted: Has been more depressed this week.  More trouble with sleep.  Taking clonazepam 1-1.5  of the 0.5 mg tablets.   Trazodone not helping much. She switched back from 200 sertraline to Lexapro 20.  No SE More trouble with motivation.  Some stress with son with special needs. Misses the benevit of Concerta but it was too short acting and crashed in 4-6 hours. Patient was administered Spravato 84 mg intranasally today.  The patient experienced the typical dissociation which gradually resolved over the 2-hour period of observation.  There were no complications.  Specifically the patient did not have nausea or vomiting or headache.  Specifically HA is less of a problem with Spravato. No SE with meds. Intensity of Spravato dissociation varies.  No scary and well tolerated.   07/17/22 appt noted: Patient was administered Spravato 84 mg intranasally today.  The patient experienced the typical dissociation which gradually resolved over the 2-hour period of observation.  There were no complications.  Specifically the patient did not have nausea or vomiting or headache.  Specifically HA is less of a problem with Spravato. No SE with meds. Intensity of Spravato dissociation varies.  Not scary and well tolerated.  Just got Jornay last night but wasn't sure how to take it.  Wants to start and this was discussed.  She felt MPH helped mood and attn but  didn't last long enough and had crash after a few hours on Concerta.  Better for 2 hours after each dose Ritalin 20 mg with mood but then it wore off.  BP up after 3rd dose 160/90 and then took H's amlodipine 5 and BP became normal.  07/22/22 appt noted: Patient was administered Spravato 84 mg intranasally today.  The patient experienced the typical dissociation which gradually resolved over the 2-hour period of observation.  There were no complications.  Specifically the patient did not have nausea or vomiting or headache.  Specifically HA is less of a problem with Spravato. No SE with meds. Intensity of Spravato dissociation varies.  Not scary and well tolerated.  Start Jornay 20 mg over the weekend and did not notice any particular effect good or bad.  Increased to 40 mg. Other psych meds: Clonazepam 0.5 mg tablets 2 nightly, gabapentin dosage varies, Dayvigo 10 mg nightly, Lexapro 20 mg daily, to trazodone 100 mg nightly  07/24/22 appt noted: Patient was administered Spravato 84 mg intranasally today.  The patient experienced the typical dissociation which gradually resolved  over the 2-hour period of observation.  There were no complications.  Specifically the patient did not have nausea or vomiting or headache.  Specifically HA is less of a problem with Spravato. No SE with meds. Intensity of Spravato dissociation varies.  Not scary and well tolerated.  Other psych meds: Clonazepam 0.5 mg tablets 2 nightly, gabapentin dosage varies, Dayvigo 10 mg nightly, Lexapro 20 mg daily, to trazodone 100 mg nightly, Jornay 40 mg PM Doing well with Spravato.  Noticed a little effect from the Jornay 40 without SE but would like to increase the dose for ADD and mood.  Sleeping well.  No new concerns.  Still more depressed than she was with th initial benefit of Spravato.  07/30/22 note:  Patient was administered Spravato 84 mg intranasally today.  The patient experienced the typical dissociation which gradually  resolved over the 2-hour period of observation.  There were no complications.  Specifically the patient did not have nausea or vomiting or headache.  Specifically HA is less of a problem with Spravato. Other psych meds: Clonazepam 0.5 mg tablets 2 nightly, gabapentin dosage varies, Dayvigo 10 mg nightly, Lexapro 20 mg daily, to trazodone 100 mg nightly, Jornay 60 mg PM Wants to increase Jornay to 80 mg PM to increase benefit for ADD and depression. No SE She has experienced a major family crisis that threatens to change her daily life from now on.  It has not triggered suicidal thoughts at this time but she is very afraid.  No desire to change medications.  08/04/22 appt noted" Patient was administered Spravato 84 mg intranasally today.  The patient experienced the typical dissociation which gradually resolved over the 2-hour period of observation.  There were no complications.  Specifically the patient did not have nausea or vomiting or headache.  Specifically HA is less of a problem with Spravato. Other psych meds: Clonazepam 0.5 mg tablets 2 nightly, gabapentin dosage varies, Dayvigo 10 mg nightly, Lexapro 20 mg daily, trazodone 100 mg nightly, Jornay 80 mg PM No SE No benefit or SE with increase Jormay 80 mg.  Says Concerta helped ADD and depression but not Jornay.  However duration was insufficient.  Still depressed and wants change to another form of stimulant.  No concerns with other meds. Continues with strong family stressors creating uncertainty about her future but no quick resolution. No SI Trial Cotempla 17.3 and DC Ritalin & Jornay 80  08/11/2022 appointment noted: Patient was administered Spravato 84 mg intranasally today.  The patient experienced the typical dissociation which gradually resolved over the 2-hour period of observation.  There were no complications.  Specifically the patient did not have nausea or vomiting or headache.  Specifically HA is less of a problem with Spravato now  vs when started it.. Other psych meds: Clonazepam 0.5 mg tablets 2 nightly, gabapentin dosage varies, Dayvigo 10 mg nightly, trazodone 100 mg nightly, cotempla 17.3, switch from Lexapro 20 to sertaline 15o mg . No noticeable effects sertraline from for depression but has seen some antianxiety effects from the sertraline.  No side effects noted.  No side effects with the other medicines.  Still is only getting very brief benefit 3 to 4 hours from Cotempla for ADD and mood.  She wants to try the Daytrana patch as we have discussed before. No SE  08/13/22 appt: Patient was administered Spravato 84 mg intranasally today.  The patient experienced the typical dissociation which gradually resolved over the 2-hour period of observation.  There were no  complications.  Specifically the patient did not have nausea or vomiting or headache.  Specifically HA is less of a problem with Spravato now vs when started it.. Other psych meds: Clonazepam 0.5 mg tablets 2 nightly, gabapentin dosage varies, Dayvigo 10 mg nightly, trazodone 100 mg nightly, cotempla 17.3, switch from Lexapro 20 to sertaline 15o mg . No noticeable effects sertraline from for depression but has seen some antianxiety effects from the sertraline.  No side effects noted.  No side effects with the other medicines.  Still is only getting very brief benefit 3 to 4 hours from Cotempla for ADD and mood.  She wants to try the Daytrana patch as we have discussed before.  Hasn't been able to get it yet. No SE Plan.  Continue to try to get Daytrana 30 AM  08/18/22 appt noted: Patient was administered Spravato 84 mg intranasally today.  The patient experienced the typical dissociation which gradually resolved over the 2-hour period of observation.  There were no complications.  Specifically the patient did not have nausea or vomiting or headache.  Specifically HA is less of a problem with Spravato now vs when started it.. Other psych meds: Clonazepam 0.5 mg  tablets 2 nightly, gabapentin dosage varies, Dayvigo 10 mg nightly, trazodone 100 mg nightly, cotempla 17.3, switch from Lexapro 20 to sertaline 15o mg . No noticeable effects sertraline from for depression but has seen some antianxiety effects from the sertraline.  No side effects noted.  No side effects with the other medicines.  Still is only getting very brief benefit 3 to 4 hours from Cotempla for ADD and mood.  She wants to try the Daytrana patch as we have discussed before.  Hasn't been able to get it yet.  Overall depression is some better than it was.   No SE Plan.  Continue to try to get Daytrana 30 AM  08/25/22 appt : No benefit Daytrana.  No SE.  Wants further med changes and interested In retrying pramipexole.   Tolerating meds. Patient was administered Spravato 84 mg intranasally today.  The patient experienced the typical dissociation which gradually resolved over the 2-hour period of observation.  There were no complications.  Specifically the patient did not have nausea or vomiting or headache.  Specifically HA is less of a problem with Spravato now vs when started it.. Other psych meds: Clonazepam 0.5 mg tablets 2 nightly, gabapentin dosage varies, Dayvigo 10 mg nightly, trazodone 100 mg nightly, , switch from Lexapro 20 to sertaline 15o mg . Daytrana 30 AM for a few days. Struggles with depression and no SI.  Reduced interest and mood and motivation and enjoyment and socialization.  08/27/22 appt noted: Psych meds:  Clonazepam 0.5 mg tablets 2 nightly, gabapentin dosage varies, Dayvigo 10 mg nightly, trazodone 100 mg nightly,  sertaline 15o mg . Returned to [employer] AM, pramipexole  Tolerating meds. Patient was administered Spravato 84 mg intranasally today.  The patient experienced the typical dissociation which gradually resolved over the 2-hour period of observation.  There were no complications.  Specifically the patient did not have nausea or vomiting or headache.   Reports  taking cotempla bc brief mood benefit and sig focus and productivity benefit. Took 1 dose pramipexole and felt more dep.  09/01/22 appt noted: Psych meds:  Clonazepam 0.5 mg tablets 2 nightly, gabapentin dosage varies, Dayvigo 10 mg nightly, trazodone 100 mg nightly,  sertaline 15o mg . Returned to Cotempla AM, pramipexole  0.25 mg BID. Tolerating meds now. Patient  was administered Spravato 84 mg intranasally today.  The patient experienced the typical dissociation which gradually resolved over the 2-hour period of observation.  There were no complications.  Specifically the patient did not have nausea or vomiting or headache.   Willing to try the pramipexole and will continue 0.25 mg BID this week and then consider increase if tolerated.  Mood is a little better.  Still looking for further improvement.  09/03/22 appt noted: Psych meds:  Clonazepam 0.5 mg tablets 2 nightly, gabapentin dosage varies, Dayvigo 10 mg nightly, trazodone 100 mg nightly,  sertaline 15o mg . Returned to Cotempla AM, pramipexole  0.25 mg tablet 1 and 1/2 tablets twice daily BID. Tolerating meds now. Patient was administered Spravato 84 mg intranasally today.  The patient experienced the typical dissociation which gradually resolved over the 2-hour period of observation.  There were no complications.  Specifically the patient did not have nausea or vomiting or headache.   Depression may be a little better with the parmipexole and is tolerating it well so far.  Still struggling with focus and residual depression.  Tolerating meds without SE.  More hopeful and able to enjoy some things.  09/08/22 appt  Psych meds:  Clonazepam 0.5 mg tablets 2 nightly, gabapentin dosage varies, Dayvigo 10 mg nightly, trazodone 100 mg nightly,  sertaline 15o mg . Returned to Cotempla AM, pramipexole  0.5 mg BID. Tolerating meds now. Patient was administered Spravato 84 mg intranasally today.  The patient experienced the typical dissociation which  gradually resolved over the 2-hour period of observation.  There were no complications.  Specifically the patient did not have nausea or vomiting or headache.   She has seen her depression drop from a 10/10 prior to treatment with Spravato to now 4/10 With additional benefit noted when she increased the pramipexole to 0.5 mg twice daily.  She is not having side effects with this like she did in the past. Anxiety levels are also improved. She is sleeping okay with the current medications. Plan: continue pramipexole trial off-label and increase more slowly for TRD.  Increase  Gradually  to 0.75 mg BID  09/11/22 appt noted: Psych meds:  Clonazepam 0.5 mg tablets 2 nightly, gabapentin dosage varies, Dayvigo 10 mg nightly, trazodone 100 mg nightly,  sertaline 15o mg . Returned to Cotempla AM, pramipexole  0.75 mg BID. Tolerating meds now. Patient was administered Spravato 84 mg intranasally today.  The patient experienced the typical dissociation which gradually resolved over the 2-hour period of observation.  There were no complications.  Specifically the patient did not have nausea or vomiting or headache.   She has seen her depression drop from a 10/10 prior to treatment with Spravato to now 4/10 With additional benefit noted when she increased the pramipexole to 0.5 mg twice daily.  She is not having side effects with this like she did in the past. Clearly improving with the pramipexole now and tolerating it.  Satisfied with current meds but would consider increasing if it might be helpful.  09/15/22 appt noted: Psych meds:  Clonazepam 0.5 mg tablets 2 nightly, gabapentin dosage varies, Dayvigo 10 mg nightly, trazodone 100 mg nightly,  sertaline 15o mg . Returned to Cotempla AM, pramipexole  0.75 mg BID too anxious and reduced to 0.5 mg BID. Tolerating meds now. Patient was administered Spravato 84 mg intranasally today.  The patient experienced the typical dissociation which gradually resolved over  the 2-hour period of observation.  There were no complications.  Specifically  the patient did not have nausea or vomiting or headache.   Trouble staying asleep longer than 6 hours.  Doesn't feel she can tolerate pramipexole 0.75 mg BID DT anxiety not experienced at  0.5 mg BID.  Otherwise tolerating meds.  Mood is better with Spravato and pramipexole.   Not sleeping as long with meds.  EMA 6 hours.  Wonders about change Disc concerns about waiting for therapy. Feels ready to reduce Spravato to weekly.    09/25/22 appt noted: Psych meds:  Clonazepam 0.5 mg tablets 2 nightly, gabapentin dosage varies, Dayvigo 10 mg nightly, trazodone 100 mg nightly,  sertaline 15o mg . Returned to Cotempla AM, pramipexole  0.75 mg BID too anxious and reduced to 0.5 mg BID. Tolerating meds now. Patient was administered Spravato 84 mg intranasally today.  The patient experienced the typical dissociation which gradually resolved over the 2-hour period of observation.  There were no complications.  Specifically the patient did not have nausea or vomiting or headache.   Trouble staying asleep longer than 6 hours.  Failed to respond to Seroquel 25 mg HS.  Gone back to [facility].  Belsomra NR. Mood ok until the last few days more dep bc it's been 10 days since last admin.  Wants to continue weekly.   No SE.  No med changes desired.  09/29/22 appt noted: Psych meds:  Clonazepam 0.5 mg tablets 2 nightly, gabapentin dosage varies, Dayvigo 10 mg nightly, trazodone 100 mg nightly,  sertaline 15o mg . Returned to Cotempla AM, pramipexole  0.75 mg BID too anxious and reduced to 0.5 mg BID. Tolerating meds now. Patient was administered Spravato 84 mg intranasally today.  The patient experienced the typical dissociation which gradually resolved over the 2-hour period of observation.  There were no complications.  Specifically the patient did not have nausea or vomiting or headache.   Trouble staying asleep longer than 6 hours.  Plan:  Continue sertaline to 200 mg daily.  It has helped anxiety but not depression.   for insomnia, clonazepam 1 mg HS. trazodone doesn't work well but helps some Continue Dayvigo 10 HS.  Failed resp Belsomra 15 and seroquel 25 She wants to continue Cotempla or Concerta bc helps mood early in day and focus  benefit lasts longer.  Have not been able to get stimulant to be consitently effective throughout the day. continue pramipexole trial off-label and reduce back to  TRD 0.5 mg BID Spravato weekly.  10/13/22 appt noted: Not sleeping as well.  Wants to increase Spravato to twice weekly bc feeling more dep close to the next sched date.  Just the day or 2 before.  .  Plan: Trial for off label TRD increase pramipexole 1 mg TID  10/20/22 appt noted: Psych meds:  Clonazepam 0.5 mg tablets 2 nightly, gabapentin dosage varies, Dayvigo 10 mg nightly, trazodone 100 mg nightly,  sertaline 15o mg . Returned to Cotempla AM, pramipexole  1 mg TID. Tolerating meds now. Patient was administered Spravato 84 mg intranasally today.  The patient experienced the typical dissociation which gradually resolved over the 2-hour period of observation.  There were no complications.  Specifically the patient did not have nausea or vomiting or headache.   Trouble staying asleep longer than 6 hours.  More dep this week and asked to get Spravato twice this week. Plan: Trial for off label TRD increase pramipexole 1 mg QID  10/22/22 appt noted:  Psych meds:  Clonazepam 0.5 mg tablets 2 nightly and another one with  EMA, gabapentin dosage varies, Belsomra 20 mg daily,  trazodone 100 mg nightly,  sertaline 15o mg . Returned to Cotempla AM, pramipexole  1 mg QID for 2nd day Tolerating meds now.  Except a little N and dizzy briefly after takes it. Patient was administered Spravato 84 mg intranasally today.  The patient experienced the typical dissociation which gradually resolved over the 2-hour period of observation.  There were no  complications.  Specifically the patient did not have nausea or vomiting or headache. No change in mood yet.   Sleep better with Belsomra 20 as long as takes with other meds.  If insomnia mood is worse. Ongoing some dep but gets partial relief with Spravato but would like better relief .  10/27/22 received Spravato 84  11/03/22 appt noted:  Meds as above except sertraline she cut to 100 mg instead of 200 mg daily She doesn't think sertraline helping and worries over poss SE Off and on Cotempla .  Not affecting BP. Doesn't notice benefit or SE with pramipexole 1 mg QID.  No SE Sleep is OK.  Some awakening aftter 6 hours but not enough so takes another clonazepam.   Patient was administered Spravato 84 mg intranasally today.  The patient experienced the typical dissociation which gradually resolved over the 2-hour period of observation.  There were no complications.  Specifically the patient did not have nausea or vomiting or headache. No change in mood yet.   Sleep better with Belsomra 20 as long as takes with other meds.  If insomnia mood is worse. Ongoing some dep but gets partial relief with Spravato but would like better relief .  No sig benefit with pramipexole 1 mg QID.  11/21/22 TC:  To ER with dep 11/21/22. No SE with selegiline but feels more dep.  Wants to stop it. Today is only day 2 of 1 tablet of selegiline 5 mg AM.  Reviewed with her that she had not been satisfied with the response of Spravato and Zoloft and that she is likely feeling worse because the Zoloft is wearing off and in particular because she abruptly stopped 150 mg daily.  She needs to give the selegiline time to work.  She agrees to do so.  She admitted to some passive suicidal thoughts but no active suicidal thoughts and no desire to die.  She agrees to the plan to increase selegiline to 10 mg every morning this weekend and to continue the selegiline trial.  She cannot stop this and immediately start another AD due to risk  DDI with serotonin syndrome.  She is aware.  Meredith Staggers, MD, DFAPA  12/19/22 TC: Pt called at 1:42p.  She said she wanted to know if she could start weaning off the Selegiline.  She thinks increasing the dosage is making things worse.  She said she wants to go back to [employer].  She acknowleged she has an appt on Monday. I advised her that I wasn't sure Dr Jennelle Human will see her message before her appt, but she still ask me to send the message.  Next appt 8/26 MD resp.  No change until Monday appt.  12/22/22 appt noted: Meds: selegiline up to 5 mg 4 daily for a week but felt it incr irritability and went back to 3 mg daily. She wants to go back to Spravato.  Not near as dep as now.  Selegiline didn't help energy. Still on Belsomra Thinks maybe sertraline helped mood more than she thought. More dep than anxiety.  Plan: DC selegiline and will retry Effexor which helped in the past at 300 mg daily.  Start this Friday after over 10 half lives of selegiline.    12/24/22 appt noted: Stopped selegiline  Psych med: belsomra 20, clonazepam 1 mg HS.   She has felt more dep off the Spravato though at the time didn't think it was helping.  Has felt more down and withdrawn.  Low energy, motivation, not smiling.  No SI.  Sleep ok.   Patient was administered Spravato 84 mg intranasally today.  The patient experienced the typical dissociation which gradually resolved over the 2-hour period of observation.  There were no complications.  Specifically the patient did not have nausea or vomiting or headache. No problems with BP or other unusual SE Mood is a little better after this administration of Spravato.  Very motivated to continue it.  No immediate med concerns. No SI .  Ongoing anhedonia.  12/25/22 appt noted: Psych med: belsomra 20, clonazepam 1 mg HS.  Not resumed stimulant or AD yet   No SE. Patient was administered Spravato 84 mg intranasally today.  The patient experienced the typical dissociation  which gradually resolved over the 2-hour period of observation.  There were no complications.  Specifically the patient did not have nausea or vomiting or headache.  There is a lot things going on in her life and it is somewhat difficult to separate stressors from what is going on with her mood.  However she is happy to resume Spravato as she decided after stopping briefly that it was helpful.  She plans to start the antidepressant Morrow.  She also wants to resume the stimulant tomorrow.  12/30/22 appt noted; Psych med: belsomra 20, clonazepam 1 mg HS.  Not resumed stimulant.  Effexor XR 37.5 mg daily   No SE. Patient was administered Spravato 84 mg intranasally today.  The patient experienced the typical dissociation which gradually resolved over the 2-hour period of observation.  There were no complications.  Specifically the patient did not have nausea or vomiting or headache.  She is still having depression with very prominent anhedonia.  However she feels the Spravato is somewhat helpful and is hopeful for more complete response as she gets back onto a reasonable antidepressant dose.  She tolerated Effexor XR 300 mg daily in the past and took it for 5 months and it is probably the antidepressants worked best for her.  She is agreeable with this plan.  She is getting sleep benefit from the current medications Plan: incr Effexor 75 mg daily  01/02/23 appt noted: Psych meds as noted above except increase Effexor XR to 75 mg daily without side effects. Patient was administered Spravato 84 mg intranasally today.  The patient experienced the typical dissociation which gradually resolved over the 2-hour period of observation.  There were no complications.  Specifically the patient did not have nausea or vomiting or headache.  Agrees to increasing Effexor as planned.  SX not changed from that noted above.  No SI  01/05/23 appt noted: Psych meds as noted above except increase Effexor XR to 112.5 mg daily  without side effects. Patient was administered Spravato 84 mg intranasally today.  The patient experienced the typical dissociation which gradually resolved over the 2-hour period of observation.  There were no complications.  Specifically the patient did not have nausea or vomiting or headache.  Agrees to increasing Effexor as planned gradually to 300 mg daily.  SX not changed from that noted above.  Does however feel Spravato has dampened the degree of depression.  No SI Stressful life event discussed and won't be resolved until early next year.  01/07/23 appt noted: Psych meds Effexor XR 112.5 mg daily. Patient was administered Spravato 84 mg intranasally today.  The patient experienced the typical dissociation which gradually resolved over the 2-hour period of observation.  There were no complications.  Specifically the patient did not have nausea or vomiting or headache.  Agrees to increasing Effexor as planned gradually to 300 mg daily.  SX not changed from that noted above.  Does however feel Spravato has dampened the degree of depression markedly compared to when off psych meds and no Spravato.  No SI  01/12/23 appt noted; Psych meds Effexor XR 225 mg daily. No SE notedl. Patient was administered Spravato 84 mg intranasally today.  The patient experienced the typical dissociation which gradually resolved over the 2-hour period of observation.  There were no complications.  Specifically the patient did not have nausea or vomiting.  But is having headache.  Agrees to increasing Effexor as planned gradually to 300 mg daily.  SX not changed from that noted above.  Does however feel Spravato has dampened the degree of depression markedly compared to when off psych meds and no Spravato.  No SI  01/15/23 appt noted:  Psych meds Effexor XR 225 mg daily. No SE noted. Patient was administered Spravato 84 mg intranasally today.  The patient experienced the typical dissociation which gradually resolved  over the 2-hour period of observation.  There were no complications.  Specifically the patient did not have nausea or vomiting.   Doing ok with higher doses of Effexor and will plan to increase to 300 mg daily. Mild improvement noticed so far with the incrase in meds.   Uses Cotempla prn and it helps but doesn't liek the way she feels when it wears off.  01/22/23 appt noted: Psych meds reduced Effexor XR 150 mg daily. Bc of HA No SE noted. Patient was administered Spravato 84 mg intranasally today.  The patient experienced the typical dissociation which gradually resolved over the 2-hour period of observation.  There were no complications.  Specifically the patient did not have nausea or vomiting.  Ongoing moderate levels of depression with some improvement from the Spravato.  She is not sure about the effect of the venlafaxine on her mood yet.  She would like to continue to try pursuing a stimulant but has had difficulty tolerating p.o. versions of stimulants because of the crash at the end and side effects with many of them.  She has several questions about this.  01/27/23 appt noted: Psych meds: Clonazepam 1 to 1.5 mg nightly, lithium CR 300 nightly, Belsomra 20 nightly, venlafaxine XR 150 every morning No side effects noted at the current dosage. Patient was administered Spravato 84 mg intranasally today.  The patient experienced the typical dissociation which gradually resolved over the 2-hour period of observation.  There were no complications.  Specifically the patient did not have nausea or vomiting.  Ongoing moderate levels of depression with some improvement from the Spravato.   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  She does not tolerate oral stimulants very well because she has a crash as they wear off which is very dysphoric.  She is willing to retry the Daytrana patches that she took in the spring.  She took 1 patch on  1 occasion recently and  felt it was helpful. However she is also having problems with early morning awakening over the last week without precipitant.  She would like to have a different treatment strategy for sleep but understands her sleep problems have historically been difficult to resolve and maintain.  AIMS    Flowsheet Row Video Visit from 01/03/2022 in [facility]  AIMS Total Score 0      ECT-MADRS    Flowsheet Row Clinical Support from 08/13/2022 in [facility] Video Visit from 04/29/2022 in [facility] Video Visit from 02/06/2022 in [facility]  MADRS Total Score 27 44 23      GAD-7    Flowsheet Row Counselor from 12/19/2022 in [facility] Office Visit from 08/22/2022 in [facility] [place]  Total GAD-7 Score 4 7      PHQ2-9    Flowsheet Row Counselor from 12/19/2022 in [facility] Office Visit from 08/22/2022 in [facility] [place] Office Visit from 08/19/2022 in [facility] Video Visit from 02/06/2022 in [facility] Office Visit from 11/16/2020 in [facility] for [facility] at [facility] Total Score 2 3 0 6 0  PHQ-9 Total Score 5 5 -- 14 --        Past Psychiatric Medication Trials: Trintellix - Initially effective and then was not effective when re-started Paxil- Ineffective. Helped initially. Prozac-Insomnia Lexapro- Effective and then no longer as effective Zoloft- Initially effective and then minimal response Viibryd Cymbalta- Ineffective. Helped initially. Effexor XR 300- Effective, well tolerated. Pristiq- Increased anxiety at 150 mg daily Wellbutrin XL-Ineffective.May have increased anxiety. Amitriptyline Nortriptyline-Caused  irritability, constipation Auvelity- ineffective Selegilne brief 20 mg daily , more irritable  Abilify- Helpful but caused severe insomnia. Rexulti-Helpful but caused insomnia. Vraylar Seroquel - Ineffective Risperdal Olanzapine- Ineffective Latuda- Ineffective Caplyta  Klonopin- Effective Temazepam- Took during menopause Lunesta- Ineffective Ambien-Ineffective Sonata- Ineffective Dayvigo- partially effective  Belsomra 20 worked and then failed  Trazodone-  somewhat effective Remeron-Ineffective. Does not recall wt gain.  Quetiapine 25 NR  Adderall- Took once and had adverse effect. Concerta- Effective but only 4-6 hours Jornay 80 NR Metadate-Not as effective as Concerta Dexmethylphenidate- Not as effective as Concerta Azstarys- some side effects. Not as effective.  Ritalin short acting and SE anxiety & HTN @20  TID Cotemplay 17.3  Daytrana  Lamictal- May have had an adverse effects. "Felt weird." Reports worsening s/s.  Lithium ? effect Gabapentin Pramipexole- 1 mg QID NR  ECT #20 with minimal response. H says it was partly helpful.  AIMS    Flowsheet Row Video Visit from 01/03/2022 in [facility]  AIMS Total Score 0      ECT-MADRS    Flowsheet Row Clinical Support from 08/13/2022 in [facility] Video Visit from 04/29/2022 in [facility] Video Visit from 02/06/2022 in [facility]  MADRS Total Score 27 44 23      GAD-7    Flowsheet Row Counselor from 12/19/2022 in [facility] Office Visit from 08/22/2022 in [facility] [place]  Total GAD-7 Score 4 7      PHQ2-9    Flowsheet Row Counselor from 12/19/2022 in [facility] Office Visit from 08/22/2022 in [facility] [place]  Office Visit from 08/19/2022 in [facility] for [facility] at  [facility] Video Visit from 02/06/2022 in [facility] Office Visit from 11/16/2020 in [facility] for [facility] at [facility]  PHQ-2 Total Score 2 3 0 6 0  PHQ-9 Total Score 5 5 -- 14 --        Review of Systems:  Review of Systems  Cardiovascular:  Negative for chest pain and palpitations.  Neurological:  Positive for headaches. Negative for dizziness and tremors.  Psychiatric/Behavioral:  Positive for dysphoric mood and sleep disturbance. Negative for decreased concentration and suicidal ideas. The patient is nervous/anxious.     Medications: I have reviewed the patient's current medications.  Current Outpatient Medications  Medication Sig Dispense Refill   Esketamine HCl, 84 MG Dose, (SPRAVATO, 84 MG DOSE,) 28 MG/DEVICE SOPK Place 84 mg into the nose every 7 (seven) days. 3 each 5   Estradiol (VAGIFEM) 10 MCG TABS vaginal tablet Place 1 tablet (10 mcg total) vaginally 2 (two) times a week. 24 tablet 3   lithium carbonate (LITHOBID) 300 MG ER tablet Take 1 tablet (300 mg total) by mouth at bedtime. 90 tablet 0   Suvorexant (BELSOMRA) 20 MG TABS TAKE 1 TABLET BY MOUTH AT BEDTIME 30 tablet 2   temazepam (RESTORIL) 30 MG capsule Take 1 capsule (30 mg total) by mouth at bedtime as needed for sleep. 30 capsule 0   valACYclovir (VALTREX) 500 MG tablet Take one twice daily for 3 days with any symptoms 30 tablet 3   valsartan (DIOVAN) 80 MG tablet Take 1 tablet (80 mg total) by mouth daily. 90 tablet 3   venlafaxine XR (EFFEXOR-XR) 75 MG 24 hr capsule Take 3 capsules (225 mg total) by mouth daily. (Patient taking differently: Take 150 mg by mouth daily.) 270 capsule 0   methylphenidate (DAYTRANA) 30 MG/9HR Place 1 patch onto the skin daily. wear patch for 9 hours only each day 30 patch 0   No current facility-administered medications for this visit.    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   TRANSAMINASES, SERUM, ELEVATED 08/14/2009    Past Medical History, Surgical history, Social history, and Family history were reviewed and updated as appropriate.   Please see review of systems for further details on the patient's review from today.   Objective:   Physical Exam:  LMP 12/27/2007 (Exact Date)   Physical Exam Constitutional:      General: She is not in acute distress. Musculoskeletal:        General: No deformity.  Neurological:     Mental Status: She is alert and oriented to person, place, and time.     Coordination: Coordination normal.  Psychiatric:        Attention and Perception: Attention and perception normal. She does not perceive auditory hallucinations.        Mood and Affect: Mood is anxious and depressed. Affect is blunt.        Speech: Speech normal.        Behavior: Behavior normal.  Thought Content: Thought content normal. Thought content is not delusional. Thought content does not include homicidal or suicidal ideation. Thought content does not include suicidal plan.        Cognition and Memory: Cognition and memory normal.        Judgment: Judgment normal.     Comments: Insight intact Mood still mod dep but better with Spravato.  She is doing better overall. Affect blunted but with a little more expression today     Lab Review:     Component Value Date/Time   NA 132 (L) 10/27/2022 1107   K 4.4 10/27/2022 1107   CL 95 (L) 10/27/2022 1107   CO2 30 10/27/2022 1107   GLUCOSE 89 10/27/2022 1107   BUN 14 10/27/2022 1107   CREATININE 0.67 10/27/2022 1107   CREATININE 0.68 01/26/2020 0920   CALCIUM 10.1 10/27/2022 1107    PROT 8.3 04/22/2022 0932   ALBUMIN 5.3 (H) 04/22/2022 0932   AST 82 (H) 04/22/2022 0932   ALT 118 (H) 04/22/2022 0932   ALKPHOS 74 04/22/2022 0932   BILITOT 0.3 04/22/2022 0932   GFRNONAA >90 05/01/2014 1645   GFRAA >90 05/01/2014 1645       Component Value Date/Time   WBC 3.3 (L) 04/22/2022 0932   RBC 4.25 04/22/2022 0932   HGB 14.3 04/22/2022 0932   HGB 14.1 10/26/2014 1034   HCT 40.7 04/22/2022 0932   PLT 245.0 04/22/2022 0932   MCV 95.7 04/22/2022 0932   MCH 34.4 (H) 01/26/2020 0920   MCHC 35.1 04/22/2022 0932   RDW 13.2 04/22/2022 0932   LYMPHSABS 0.9 04/22/2022 0932   MONOABS 0.2 04/22/2022 0932   EOSABS 0.1 04/22/2022 0932   BASOSABS 0.0 04/22/2022 0932    No results found for: "POCLITH", "LITHIUM"   No results found for: "PHENYTOIN", "PHENOBARB", "VALPROATE", "CBMZ"   .res Assessment: Plan:    Kai was seen today for follow-up, depression, stress, add and sleeping problem.  Diagnoses and all orders for this visit:  Recurrent major depression resistant to treatment (HCC)  Generalized anxiety disorder  Attention deficit hyperactivity disorder (ADHD), predominantly inattentive type -     methylphenidate (DAYTRANA) 30 MG/9HR; Place 1 patch onto the skin daily. wear patch for 9 hours only each day  Insomnia, unspecified type -     temazepam (RESTORIL) 30 MG capsule; Take 1 capsule (30 mg total) by mouth at bedtime as needed for sleep.  Stressful life event affecting family    Pt seen for TRD with multiple failed medications as noted above.  Some of these failures have been related to an adequate dose and duration as she has had a tendency to get frustrated with medication trials.TRD need 6 week trial of AD at full dose at least.   She has had no problems with the transition which has been started from selegiline to Effexor XR. But doesn't think she can tolerate 300 mg Effexor right now bc HA developed.  Patient was administered Spravato 84 mg intranasally  today.  The patient experienced the typical dissociation which gradually resolved over the 2-hour period of observation.  There were no complications.  Specifically the patient did not have nausea or vomiting or headache.  Blood pressures remained within normal ranges at the 40-minute and 2-hour follow-up intervals.  By the time the 2-hour observation period was met the patient was alert and oriented and able to exit without assistance.  Patient feels the Spravato administration is helpful for the treatment resistant depression and would  like to continue the treatment.  See nursing note for further details.  Consider switch Spravato to MAOI, Parnate  We discussed the short-term risks associated with benzodiazepines including sedation and increased fall risk among others.  Discussed long-term side effect risk including dependence, potential withdrawal symptoms, and the potential eventual dose-related risk of dementia.  But recent studies from 2020 dispute this association between benzodiazepines and dementia risk. Newer studies in 2020 do not support an association with dementia.  for insomnia, it appears she has become tolerant to clonazepam even at doses of 1.5 nightly. Therefore DC clonazepam Switch to temazepam 30 mg nightly which she has taken in the past apparently with some benefit for a while.  DC Belsomra if it is not effective.  Cotempla or Concerta bc helps mood early in day and focus  benefit lasts longer.  Have not been able to get stimulant to be consitently effective throughout the day. Conisder switching to patch bc pt has significant crash with stimulants or perhaps [place] Continue trial of Daytrana 30 mg every morning  Discussed potential benefits, risks, and side effects of stimulants with patient to include increased heart rate, palpitations, insomnia, increased anxiety, increased irritability, or decreased appetite.  Instructed patient to contact office if experiencing any  significant tolerability issues.  increased venlafaxine XR to 225 mg then reduced to 150 bc HA.    Disc pretreating Spravato with NSAID or Tylenol bc of HA lately.  Supportive therapy dealing with serious stressful life event.  FU twice weekly Spravato until next week then try to drop back to weekly .  Meredith Staggers, MD, DFAPA    Please see After Visit Summary for patient specific instructions.  Future Appointments  Date Time Provider Department Center  01/29/2023 11:00 AM Gaspar Bidding, [facility] CP-CP None  02/04/2023  1:00 PM Cottle, Steva Ready., MD CP-CP None  02/04/2023  1:00 PM CP-NURSE CP-CP None  02/09/2023  1:00 PM Gaspar Bidding, [facility] CP-CP None  02/16/2023  1:00 PM Gaspar Bidding, [facility] CP-CP None  02/23/2023  1:00 PM Gaspar Bidding, [facility] CP-CP None  03/02/2023  2:00 PM Gaspar Bidding, [facility] CP-CP None  03/11/2023  9:00 AM Gaspar Bidding, [facility] CP-CP None  09/18/2023  8:15 AM Jerene Bears, MD [facility]    No orders of the defined types were placed in this encounter.   -------------------------------

## 2023-01-27 NOTE — Progress Notes (Signed)
NURSES NOTE:           Patient arrived for her #52 Spravato treatment. Pt is coming once a week now. Pt is being treated for Treatment Resistant Depression, pt will be receiving 84 mg (3 of the 28 mg) nasal sprays, this is her maintenance dose. Patient taken to treatment room, I explained and discussed her treatment and how the schedule should go, along with any side effects that may occur. Answered any questions and concerns the patient had. Pt's Spravato is delivered through [place] and she is now billing with her insurance. Spravato medication is stored at doctors office per REMS/FDA guidelines. The medication is required to be locked behind two doors per FDA/REMS Protocol. Medication is also disposed of properly per regulations. All treatments and her vital signs are documented in Spravato REMS per protocol of treatment center regulations. [facility] delivers her medication.      Vital Signs assessed at 12:00 PM 113/80, pulse 70, pulse ox 92%. Instructed pt to blow her nose and recline back slightly to prevent any of medication dripping out of her nose.   Pt. Given 1st dose (28 mg inhaler), then 5 minutes between 2nd dose and 3rd dose for total of 84 mg. No complaints of nausea/vomiting reported. Pt did listen to music today, she just reclined back and closed her eyes. After 40 minutes I went to assess her vital signs, no side effects or symptoms reported, B/P 128/75, pulse 71. Dr. Jennelle Human talked to pt today about her treatment. Nurse was with pt a total of 60 minutes but pt observed for 120 minutes per protocol. Discharge vital signs were at 2:00 PM, B/P 130/78, pulse 75. She verbalized understanding. She reports dissociation but she was clear upon her discharge. She is scheduled next week. She is instructed to contact office if needing anything prior to her next treatment.     LOT # [id number]   EXP APR 2027

## 2023-01-28 NOTE — Progress Notes (Signed)
Sent MyChart message with recommendations.

## 2023-01-29 ENCOUNTER — Ambulatory Visit (INDEPENDENT_AMBULATORY_CARE_PROVIDER_SITE_OTHER): Payer: 59 | Admitting: Professional Counselor

## 2023-01-29 ENCOUNTER — Other Ambulatory Visit: Payer: Self-pay

## 2023-01-29 DIAGNOSIS — F339 Major depressive disorder, recurrent, unspecified: Secondary | ICD-10-CM | POA: Diagnosis not present

## 2023-01-30 ENCOUNTER — Encounter: Payer: Self-pay | Admitting: Professional Counselor

## 2023-01-30 NOTE — Progress Notes (Unsigned)
      [facility] Counselor/Therapist Progress Note  Patient ID: Heather Davila, MRN: [id number],    Date: 10.3.2024  Time Spent: 11:13a -12:12p   Treatment Type: Individual Therapy  Reported Symptoms: sadness, grief/loss, terafulness, fatifue, sleeplessness  Mental Status Exam:  Appearance:   Neat     Behavior:  Appropriate, Sharing, and Motivated  Motor:  Normal  Speech/Language:   Clear and Coherent and Normal Rate  Affect:  Depressed and Tearful  Mood:  sad  Thought process:  normal  Thought content:    WNL  Sensory/Perceptual disturbances:    WNL  Orientation:  Sound  Attention:  Good  Concentration:  Good  Memory:  WNL  Fund of knowledge:   Good  Insight:    Good  Judgment:   Good  Impulse Control:  Good   Risk Assessment: Danger to Self:  No Self-injurious Behavior: No Danger to Others: No Duty to Warn:no Physical Aggression / Violence:No  Access to Firearms a concern: No  Gang Involvement:No   Subjective: ***   Interventions: Cognitive Behavioral Therapy, Solution-Oriented/Positive Psychology, Humanistic/Existential, and Insight-Oriented  Diagnosis:   ICD-10-CM   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

## 2023-02-02 ENCOUNTER — Ambulatory Visit (INDEPENDENT_AMBULATORY_CARE_PROVIDER_SITE_OTHER): Payer: 59 | Admitting: Professional Counselor

## 2023-02-04 ENCOUNTER — Ambulatory Visit: Payer: 59 | Admitting: Psychiatry

## 2023-02-04 ENCOUNTER — Ambulatory Visit: Payer: 59

## 2023-02-04 VITALS — BP 124/78 | HR 73

## 2023-02-04 DIAGNOSIS — F339 Major depressive disorder, recurrent, unspecified: Secondary | ICD-10-CM

## 2023-02-04 DIAGNOSIS — G4700 Insomnia, unspecified: Secondary | ICD-10-CM

## 2023-02-04 DIAGNOSIS — F900 Attention-deficit hyperactivity disorder, predominantly inattentive type: Secondary | ICD-10-CM | POA: Diagnosis not present

## 2023-02-04 DIAGNOSIS — F411 Generalized anxiety disorder: Secondary | ICD-10-CM

## 2023-02-08 NOTE — Progress Notes (Signed)
NURSES NOTE:           Patient arrived for her #53 Spravato treatment. Pt is coming once a week now. Pt is being treated for Treatment Resistant Depression, pt will be receiving 84 mg (3 of the 28 mg) nasal sprays, this is her maintenance dose. Patient taken to treatment room, I explained and discussed her treatment and how the schedule should go, along with any side effects that may occur. Answered any questions and concerns the patient had. Pt's Spravato is delivered through [place] and she is now billing with her insurance. Spravato medication is stored at doctors office per REMS/FDA guidelines. The medication is required to be locked behind two doors per FDA/REMS Protocol. Medication is also disposed of properly per regulations. All treatments and her vital signs are documented in Spravato REMS per protocol of treatment center regulations. [facility] delivers her medication.      Vital Signs assessed at 1:00 PM 109/83, pulse 87, pulse ox 92%. Instructed pt to blow her nose and recline back slightly to prevent any of medication dripping out of her nose.   Pt. Given 1st dose (28 mg inhaler), then 5 minutes between 2nd dose and 3rd dose for total of 84 mg. No complaints of nausea/vomiting reported. Pt did listen to music today, she just reclined back and closed her eyes. After 40 minutes I went to assess her vital signs, no side effects or symptoms reported, B/P 120/75, pulse 73. Dr. Jennelle Human talked to pt today about her treatment. Nurse was with pt a total of 60 minutes but pt observed for 120 minutes per protocol. Discharge vital signs were at 3:05 PM, B/P 124/78, pulse 73. She verbalized understanding. She reports dissociation but she was clear upon her discharge. She is scheduled next week. She is instructed to contact office if needing anything prior to her next treatment.     LOT # [id number]   EXP APR 2027

## 2023-02-09 ENCOUNTER — Ambulatory Visit: Payer: 59 | Admitting: Professional Counselor

## 2023-02-09 ENCOUNTER — Encounter: Payer: Self-pay | Admitting: Professional Counselor

## 2023-02-09 DIAGNOSIS — F339 Major depressive disorder, recurrent, unspecified: Secondary | ICD-10-CM

## 2023-02-09 NOTE — Progress Notes (Signed)
      [facility] Counselor/Therapist Progress Note  Patient ID: Heather Davila, MRN: [id number],    Date: 02/09/2023  Time Spent: 1:06p - 2:05p   Treatment Type: Individual Therapy  Reported Symptoms: sadness, worries, grief/loss, low self esteem, guilt, overthinking, sense of overwhelm   Mental Status Exam:  Appearance:   Neat     Behavior:  Appropriate, Sharing, and Motivated  Motor:  Normal  Speech/Language:   Clear and Coherent and Normal Rate  Affect:  Appropriate and Congruent  Mood:  normal  Thought process:  normal  Thought content:    WNL  Sensory/Perceptual disturbances:    WNL  Orientation:  Sound  Attention:  Good  Concentration:  Good  Memory:  WNL  Fund of knowledge:   Good  Insight:    Good  Judgment:   Good  Impulse Control:  Good   Risk Assessment: Danger to Self:  No Self-injurious Behavior: No Danger to Others: No Duty to Warn:no Physical Aggression / Violence:No  Access to Firearms a concern: No  Gang Involvement:No   Subjective: Pt presented to seeing processing experience of overwhelm and unrealistic expectations for herself; she identified a pattern of heightened sense of responsibility for others across the lifespan, to the neglect of her own self care. Pt identified anxiety around these preoccupations to be worse at night. Counselor facilitated a guided meditation with the theme of caring for one's child self, aligning with a consistent child self theme in pt's process work. The meditation included breathwork and body scan for relaxation; pt voiced the meditation to be helpful in relaxing and reflection. Counselor also facilitated a grief therapy intervention around "the four things that matter" in relation to a lost loved one. Counselor and pt dicussed pt experience of healing and growth; she identified therapy as helpful with her progress.   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 Pt to practice spiritually-integrated intervention of writing a lament for purpose of grief work.   Gaspar Bidding, [facility]

## 2023-02-10 ENCOUNTER — Ambulatory Visit: Payer: 59

## 2023-02-10 ENCOUNTER — Ambulatory Visit (INDEPENDENT_AMBULATORY_CARE_PROVIDER_SITE_OTHER): Payer: 59 | Admitting: Psychiatry

## 2023-02-10 VITALS — BP 110/74 | HR 71

## 2023-02-10 DIAGNOSIS — F411 Generalized anxiety disorder: Secondary | ICD-10-CM | POA: Diagnosis not present

## 2023-02-10 DIAGNOSIS — F339 Major depressive disorder, recurrent, unspecified: Secondary | ICD-10-CM

## 2023-02-10 DIAGNOSIS — G4700 Insomnia, unspecified: Secondary | ICD-10-CM | POA: Diagnosis not present

## 2023-02-10 DIAGNOSIS — F900 Attention-deficit hyperactivity disorder, predominantly inattentive type: Secondary | ICD-10-CM

## 2023-02-10 NOTE — Progress Notes (Signed)
Heather Davila [id number] 25-Mar-1961 62 y.o.  Subjective:   Patient ID:  Heather Davila is a 62 y.o. (DOB 02-10-1961) female.  Chief Complaint:  No chief complaint on file.   HPI Heather Davila presents to the office today for follow-up of treatment resistant recurrent depression, generalized anxiety disorder and insomnia.  Almost too numerous to count med failures. She is here for Spravato administration for her treatment resistant depression.  05/21/22 appt noted: Patient was administered first dose Spravato 56 mg intranasally today.  The patient experienced the typical dissociation which gradually resolved over the 2-hour period of observation.  There were no complications.  Specifically the patient did not have nausea or vomiting or headache.   Dissociation was mild. Did not experience any emotional relief from disabling depression.wants to increase Spravato to the usual dose.  She is aware insurance is not covering the costs at this time.  No SI acutely. Tolerating meds.    05/23/22 appt noted: Patient was administered Spravato 84 mg intranasally today.  The patient experienced the typical dissociation which gradually resolved over the 2-hour period of observation.  There were no complications.  Specifically the patient did not have nausea or vomiting.   Dissociation was greater than last dose and a little bothersome DT some HA. Tolerating meds.   Mood has not changed significantly thus far with Spravato.  05/27/22 appt noted: Patient was administered Spravato 84 mg intranasally today.  The patient experienced the typical dissociation which gradually resolved over the 2-hour period of observation.  There were no complications.  Specifically the patient did not have nausea or vomiting or headache. Today she has felt a little relief from the pressing heaviness of depression but a little more anxiety.  During administration of Spravato she listens to [place] music and was able to relax a  little more with the feeling of dissociation that was mildly bothersome. Husband was also in on the session today and asked some questions about IV ketamine versus nasal spray ketamine.  05/29/22 appt noted: Cancelled today DT hypertension  05/30/22 appt noted: Patient was administered Spravato 84 mg intranasally today.  The patient experienced the typical dissociation which gradually resolved over the 2-hour period of observation.  There were no complications.  Specifically the patient did not have nausea or vomiting or headache. She is seeing some improvement in mood with less continuous depression and less intensity.  Hopefulness is better.   No med complaints or SE  06/05/22 appt noted: Patient was administered Spravato 84 mg intranasally today.  The patient experienced the typical dissociation which gradually resolved over the 2-hour period of observation.  There were no complications.  Specifically the patient did not have nausea or vomiting or headache.  Specifically HA is less of a problem with Spravato. No SE with meds. Depression is improving with less intensity.  Intermittent anxiety easily.  More able to enjoy things.  No new concerns.  06/17/22 appt noted: Patient was administered Spravato 84 mg intranasally today.  The patient experienced the typical dissociation which gradually resolved over the 2-hour period of observation.  There were no complications.  Specifically the patient did not have nausea or vomiting or headache.  Specifically HA is less of a problem with Spravato. No SE with meds. Depression is improved 45% with less intensity.  Intermittent anxiety less easily.  More able to enjoy things.  No new concerns.  More active. Current meds: increased clonazepam to about 1.5 mg HS DT recent insomnia, lexapro 20, Dayvigo 10 mg  HS, concerta 36 mg AM, trazodone 100 mg HS.  06/20/22 appt noted: Current meds: increased clonazepam to about 1.5 mg HS DT recent insomnia, lexapro 20,  Dayvigo 10 mg HS, concerta 36 mg AM, trazodone 100 mg HS. Patient was administered Spravato 84 mg intranasally today.  The patient experienced the typical dissociation which gradually resolved over the 2-hour period of observation.  There were no complications.  Specifically the patient did not have nausea or vomiting or headache.  Specifically HA is less of a problem with Spravato. No SE with meds. Depression is improved 45% with less intensity.  Intermittent anxiety less easily.  More able to enjoy things.  No new concerns.  More active.  06/23/22 appt noted:  Current meds: increased clonazepam to about 1.5 mg HS DT recent insomnia, lexapro 20, Dayvigo 10 mg HS, concerta 36 mg AM, trazodone 100 mg HS. Patient was administered Spravato 84 mg intranasally today.  The patient experienced the typical dissociation which gradually resolved over the 2-hour period of observation.  There were no complications.  Specifically the patient did not have nausea or vomiting or headache.  Specifically HA is less of a problem with Spravato. No SE with meds. Intensity of Spravato dissociation varies.  Last week very intesne and this week more typical.  Depression is continuing to improve with better interest and activity and motivation.  Gradual improvement.  Wants to continue.  06/25/22 appt noted: Current meds: increased clonazepam to about 1.5 mg HS DT recent insomnia, lexapro 20, Dayvigo 10 mg HS, concerta 36 mg AM, trazodone 100 mg HS. Patient was administered Spravato 84 mg intranasally today.  The patient experienced the typical dissociation which gradually resolved over the 2-hour period of observation.  There were no complications.  Specifically the patient did not have nausea or vomiting or headache.  Specifically HA is less of a problem with Spravato. No SE with meds. Intensity of Spravato dissociation varies.  No scary and well tolerated. Continues to gradually improve with Spravato.  She is more motivated  and enjoying things more.  H sees progress.  Wants to continue twice weekly until gets maximum response.  Dep is not gone yet.  06/30/22 appt noted:  seen with H Current meds: increased clonazepam to about 1.5 mg HS DT recent insomnia, lexapro 20, Dayvigo 10 mg HS, concerta 36 mg AM, trazodone 100 mg HS. Patient was administered Spravato 84 mg intranasally today.  The patient experienced the typical dissociation which gradually resolved over the 2-hour period of observation.  There were no complications.  Specifically the patient did not have nausea or vomiting or headache.  Specifically HA is less of a problem with Spravato. No SE with meds. Intensity of Spravato dissociation varies.  No scary and well tolerated. Continues to gradually improve with Spravato.  She is more motivated and enjoying things more.  H sees even more progress than she does; more active and smiling.  Wants to continue twice weekly until gets maximum response.  Dep is 80% better.  07/07/22 appt noted: Current meds: increased clonazepam to about 1.5 mg HS DT recent insomnia, lexapro 20, Dayvigo 10 mg HS, concerta 36 mg AM, trazodone 100 mg HS. Patient was administered Spravato 84 mg intranasally today.  The patient experienced the typical dissociation which gradually resolved over the 2-hour period of observation.  There were no complications.  Specifically the patient did not have nausea or vomiting or headache.  Specifically HA is less of a problem with Spravato. No SE with  meds. Intensity of Spravato dissociation varies.  No scary and well tolerated.  Was more intese today. Overall mood is markedly better and please with meds. No changes desired.  07/09/22 appt noted: In transition from Lexapro to sertraline and missing Concerta bc CO crash from it after about 4 hours.  Disc these concerns.  She'll discuss also with Corie Chiquito, NP More dep the last couple of days and concerned about reducing Spravato frequency while  transition from one antidep to another.  Affect less positive. Patient was administered Spravato 84 mg intranasally today.  The patient experienced the typical dissociation which gradually resolved over the 2-hour period of observation.  There were no complications.  Specifically the patient did not have nausea or vomiting or headache.  Specifically HA is less of a problem with Spravato. No SE with meds. Intensity of Spravato dissociation varies.  No scary and well tolerated.   07/14/22 appt noted: Has been more depressed this week.  More trouble with sleep.  Taking clonazepam 1-1.5  of the 0.5 mg tablets.   Trazodone not helping much. She switched back from 200 sertraline to Lexapro 20.  No SE More trouble with motivation.  Some stress with son with special needs. Misses the benevit of Concerta but it was too short acting and crashed in 4-6 hours. Patient was administered Spravato 84 mg intranasally today.  The patient experienced the typical dissociation which gradually resolved over the 2-hour period of observation.  There were no complications.  Specifically the patient did not have nausea or vomiting or headache.  Specifically HA is less of a problem with Spravato. No SE with meds. Intensity of Spravato dissociation varies.  No scary and well tolerated.   07/17/22 appt noted: Patient was administered Spravato 84 mg intranasally today.  The patient experienced the typical dissociation which gradually resolved over the 2-hour period of observation.  There were no complications.  Specifically the patient did not have nausea or vomiting or headache.  Specifically HA is less of a problem with Spravato. No SE with meds. Intensity of Spravato dissociation varies.  Not scary and well tolerated.  Just got Jornay last night but wasn't sure how to take it.  Wants to start and this was discussed.  She felt MPH helped mood and attn but didn't last long enough and had crash after a few hours on Concerta.   Better for 2 hours after each dose Ritalin 20 mg with mood but then it wore off.  BP up after 3rd dose 160/90 and then took H's amlodipine 5 and BP became normal.  07/22/22 appt noted: Patient was administered Spravato 84 mg intranasally today.  The patient experienced the typical dissociation which gradually resolved over the 2-hour period of observation.  There were no complications.  Specifically the patient did not have nausea or vomiting or headache.  Specifically HA is less of a problem with Spravato. No SE with meds. Intensity of Spravato dissociation varies.  Not scary and well tolerated.  Start Jornay 20 mg over the weekend and did not notice any particular effect good or bad.  Increased to 40 mg. Other psych meds: Clonazepam 0.5 mg tablets 2 nightly, gabapentin dosage varies, Dayvigo 10 mg nightly, Lexapro 20 mg daily, to trazodone 100 mg nightly  07/24/22 appt noted: Patient was administered Spravato 84 mg intranasally today.  The patient experienced the typical dissociation which gradually resolved over the 2-hour period of observation.  There were no complications.  Specifically the patient did not have  nausea or vomiting or headache.  Specifically HA is less of a problem with Spravato. No SE with meds. Intensity of Spravato dissociation varies.  Not scary and well tolerated.  Other psych meds: Clonazepam 0.5 mg tablets 2 nightly, gabapentin dosage varies, Dayvigo 10 mg nightly, Lexapro 20 mg daily, to trazodone 100 mg nightly, Jornay 40 mg PM Doing well with Spravato.  Noticed a little effect from the Jornay 40 without SE but would like to increase the dose for ADD and mood.  Sleeping well.  No new concerns.  Still more depressed than she was with th initial benefit of Spravato.  07/30/22 note:  Patient was administered Spravato 84 mg intranasally today.  The patient experienced the typical dissociation which gradually resolved over the 2-hour period of observation.  There were no  complications.  Specifically the patient did not have nausea or vomiting or headache.  Specifically HA is less of a problem with Spravato. Other psych meds: Clonazepam 0.5 mg tablets 2 nightly, gabapentin dosage varies, Dayvigo 10 mg nightly, Lexapro 20 mg daily, to trazodone 100 mg nightly, Jornay 60 mg PM Wants to increase Jornay to 80 mg PM to increase benefit for ADD and depression. No SE She has experienced a major family crisis that threatens to change her daily life from now on.  It has not triggered suicidal thoughts at this time but she is very afraid.  No desire to change medications.  08/04/22 appt noted" Patient was administered Spravato 84 mg intranasally today.  The patient experienced the typical dissociation which gradually resolved over the 2-hour period of observation.  There were no complications.  Specifically the patient did not have nausea or vomiting or headache.  Specifically HA is less of a problem with Spravato. Other psych meds: Clonazepam 0.5 mg tablets 2 nightly, gabapentin dosage varies, Dayvigo 10 mg nightly, Lexapro 20 mg daily, trazodone 100 mg nightly, Jornay 80 mg PM No SE No benefit or SE with increase Jormay 80 mg.  Says Concerta helped ADD and depression but not Jornay.  However duration was insufficient.  Still depressed and wants change to another form of stimulant.  No concerns with other meds. Continues with strong family stressors creating uncertainty about her future but no quick resolution. No SI Trial Cotempla 17.3 and DC Ritalin & Jornay 80  08/11/2022 appointment noted: Patient was administered Spravato 84 mg intranasally today.  The patient experienced the typical dissociation which gradually resolved over the 2-hour period of observation.  There were no complications.  Specifically the patient did not have nausea or vomiting or headache.  Specifically HA is less of a problem with Spravato now vs when started it.. Other psych meds: Clonazepam 0.5 mg  tablets 2 nightly, gabapentin dosage varies, Dayvigo 10 mg nightly, trazodone 100 mg nightly, cotempla 17.3, switch from Lexapro 20 to sertaline 15o mg . No noticeable effects sertraline from for depression but has seen some antianxiety effects from the sertraline.  No side effects noted.  No side effects with the other medicines.  Still is only getting very brief benefit 3 to 4 hours from Cotempla for ADD and mood.  She wants to try the Daytrana patch as we have discussed before. No SE  08/13/22 appt: Patient was administered Spravato 84 mg intranasally today.  The patient experienced the typical dissociation which gradually resolved over the 2-hour period of observation.  There were no complications.  Specifically the patient did not have nausea or vomiting or headache.  Specifically HA is less  of a problem with Spravato now vs when started it.. Other psych meds: Clonazepam 0.5 mg tablets 2 nightly, gabapentin dosage varies, Dayvigo 10 mg nightly, trazodone 100 mg nightly, cotempla 17.3, switch from Lexapro 20 to sertaline 15o mg . No noticeable effects sertraline from for depression but has seen some antianxiety effects from the sertraline.  No side effects noted.  No side effects with the other medicines.  Still is only getting very brief benefit 3 to 4 hours from Cotempla for ADD and mood.  She wants to try the Daytrana patch as we have discussed before.  Hasn't been able to get it yet. No SE Plan.  Continue to try to get Daytrana 30 AM  08/18/22 appt noted: Patient was administered Spravato 84 mg intranasally today.  The patient experienced the typical dissociation which gradually resolved over the 2-hour period of observation.  There were no complications.  Specifically the patient did not have nausea or vomiting or headache.  Specifically HA is less of a problem with Spravato now vs when started it.. Other psych meds: Clonazepam 0.5 mg tablets 2 nightly, gabapentin dosage varies, Dayvigo 10 mg  nightly, trazodone 100 mg nightly, cotempla 17.3, switch from Lexapro 20 to sertaline 15o mg . No noticeable effects sertraline from for depression but has seen some antianxiety effects from the sertraline.  No side effects noted.  No side effects with the other medicines.  Still is only getting very brief benefit 3 to 4 hours from Cotempla for ADD and mood.  She wants to try the Daytrana patch as we have discussed before.  Hasn't been able to get it yet.  Overall depression is some better than it was.   No SE Plan.  Continue to try to get Daytrana 30 AM  08/25/22 appt : No benefit Daytrana.  No SE.  Wants further med changes and interested In retrying pramipexole.   Tolerating meds. Patient was administered Spravato 84 mg intranasally today.  The patient experienced the typical dissociation which gradually resolved over the 2-hour period of observation.  There were no complications.  Specifically the patient did not have nausea or vomiting or headache.  Specifically HA is less of a problem with Spravato now vs when started it.. Other psych meds: Clonazepam 0.5 mg tablets 2 nightly, gabapentin dosage varies, Dayvigo 10 mg nightly, trazodone 100 mg nightly, , switch from Lexapro 20 to sertaline 15o mg . Daytrana 30 AM for a few days. Struggles with depression and no SI.  Reduced interest and mood and motivation and enjoyment and socialization.  08/27/22 appt noted: Psych meds:  Clonazepam 0.5 mg tablets 2 nightly, gabapentin dosage varies, Dayvigo 10 mg nightly, trazodone 100 mg nightly,  sertaline 15o mg . Returned to [employer] AM, pramipexole  Tolerating meds. Patient was administered Spravato 84 mg intranasally today.  The patient experienced the typical dissociation which gradually resolved over the 2-hour period of observation.  There were no complications.  Specifically the patient did not have nausea or vomiting or headache.   Reports taking cotempla bc brief mood benefit and sig focus and  productivity benefit. Took 1 dose pramipexole and felt more dep.  09/01/22 appt noted: Psych meds:  Clonazepam 0.5 mg tablets 2 nightly, gabapentin dosage varies, Dayvigo 10 mg nightly, trazodone 100 mg nightly,  sertaline 15o mg . Returned to Cotempla AM, pramipexole  0.25 mg BID. Tolerating meds now. Patient was administered Spravato 84 mg intranasally today.  The patient experienced the typical dissociation which gradually resolved over  the 2-hour period of observation.  There were no complications.  Specifically the patient did not have nausea or vomiting or headache.   Willing to try the pramipexole and will continue 0.25 mg BID this week and then consider increase if tolerated.  Mood is a little better.  Still looking for further improvement.  09/03/22 appt noted: Psych meds:  Clonazepam 0.5 mg tablets 2 nightly, gabapentin dosage varies, Dayvigo 10 mg nightly, trazodone 100 mg nightly,  sertaline 15o mg . Returned to Cotempla AM, pramipexole  0.25 mg tablet 1 and 1/2 tablets twice daily BID. Tolerating meds now. Patient was administered Spravato 84 mg intranasally today.  The patient experienced the typical dissociation which gradually resolved over the 2-hour period of observation.  There were no complications.  Specifically the patient did not have nausea or vomiting or headache.   Depression may be a little better with the parmipexole and is tolerating it well so far.  Still struggling with focus and residual depression.  Tolerating meds without SE.  More hopeful and able to enjoy some things.  09/08/22 appt  Psych meds:  Clonazepam 0.5 mg tablets 2 nightly, gabapentin dosage varies, Dayvigo 10 mg nightly, trazodone 100 mg nightly,  sertaline 15o mg . Returned to Cotempla AM, pramipexole  0.5 mg BID. Tolerating meds now. Patient was administered Spravato 84 mg intranasally today.  The patient experienced the typical dissociation which gradually resolved over the 2-hour period of observation.   There were no complications.  Specifically the patient did not have nausea or vomiting or headache.   She has seen her depression drop from a 10/10 prior to treatment with Spravato to now 4/10 With additional benefit noted when she increased the pramipexole to 0.5 mg twice daily.  She is not having side effects with this like she did in the past. Anxiety levels are also improved. She is sleeping okay with the current medications. Plan: continue pramipexole trial off-label and increase more slowly for TRD.  Increase  Gradually  to 0.75 mg BID  09/11/22 appt noted: Psych meds:  Clonazepam 0.5 mg tablets 2 nightly, gabapentin dosage varies, Dayvigo 10 mg nightly, trazodone 100 mg nightly,  sertaline 15o mg . Returned to Cotempla AM, pramipexole  0.75 mg BID. Tolerating meds now. Patient was administered Spravato 84 mg intranasally today.  The patient experienced the typical dissociation which gradually resolved over the 2-hour period of observation.  There were no complications.  Specifically the patient did not have nausea or vomiting or headache.   She has seen her depression drop from a 10/10 prior to treatment with Spravato to now 4/10 With additional benefit noted when she increased the pramipexole to 0.5 mg twice daily.  She is not having side effects with this like she did in the past. Clearly improving with the pramipexole now and tolerating it.  Satisfied with current meds but would consider increasing if it might be helpful.  09/15/22 appt noted: Psych meds:  Clonazepam 0.5 mg tablets 2 nightly, gabapentin dosage varies, Dayvigo 10 mg nightly, trazodone 100 mg nightly,  sertaline 15o mg . Returned to Cotempla AM, pramipexole  0.75 mg BID too anxious and reduced to 0.5 mg BID. Tolerating meds now. Patient was administered Spravato 84 mg intranasally today.  The patient experienced the typical dissociation which gradually resolved over the 2-hour period of observation.  There were no  complications.  Specifically the patient did not have nausea or vomiting or headache.   Trouble staying asleep longer than 6  hours.  Doesn't feel she can tolerate pramipexole 0.75 mg BID DT anxiety not experienced at  0.5 mg BID.  Otherwise tolerating meds.  Mood is better with Spravato and pramipexole.   Not sleeping as long with meds.  EMA 6 hours.  Wonders about change Disc concerns about waiting for therapy. Feels ready to reduce Spravato to weekly.    09/25/22 appt noted: Psych meds:  Clonazepam 0.5 mg tablets 2 nightly, gabapentin dosage varies, Dayvigo 10 mg nightly, trazodone 100 mg nightly,  sertaline 15o mg . Returned to Cotempla AM, pramipexole  0.75 mg BID too anxious and reduced to 0.5 mg BID. Tolerating meds now. Patient was administered Spravato 84 mg intranasally today.  The patient experienced the typical dissociation which gradually resolved over the 2-hour period of observation.  There were no complications.  Specifically the patient did not have nausea or vomiting or headache.   Trouble staying asleep longer than 6 hours.  Failed to respond to Seroquel 25 mg HS.  Gone back to [facility].  Belsomra NR. Mood ok until the last few days more dep bc it's been 10 days since last admin.  Wants to continue weekly.   No SE.  No med changes desired.  09/29/22 appt noted: Psych meds:  Clonazepam 0.5 mg tablets 2 nightly, gabapentin dosage varies, Dayvigo 10 mg nightly, trazodone 100 mg nightly,  sertaline 15o mg . Returned to Cotempla AM, pramipexole  0.75 mg BID too anxious and reduced to 0.5 mg BID. Tolerating meds now. Patient was administered Spravato 84 mg intranasally today.  The patient experienced the typical dissociation which gradually resolved over the 2-hour period of observation.  There were no complications.  Specifically the patient did not have nausea or vomiting or headache.   Trouble staying asleep longer than 6 hours.  Plan: Continue sertaline to 200 mg daily.  It has  helped anxiety but not depression.   for insomnia, clonazepam 1 mg HS. trazodone doesn't work well but helps some Continue Dayvigo 10 HS.  Failed resp Belsomra 15 and seroquel 25 She wants to continue Cotempla or Concerta bc helps mood early in day and focus  benefit lasts longer.  Have not been able to get stimulant to be consitently effective throughout the day. continue pramipexole trial off-label and reduce back to  TRD 0.5 mg BID Spravato weekly.  10/13/22 appt noted: Not sleeping as well.  Wants to increase Spravato to twice weekly bc feeling more dep close to the next sched date.  Just the day or 2 before.  .  Plan: Trial for off label TRD increase pramipexole 1 mg TID  10/20/22 appt noted: Psych meds:  Clonazepam 0.5 mg tablets 2 nightly, gabapentin dosage varies, Dayvigo 10 mg nightly, trazodone 100 mg nightly,  sertaline 15o mg . Returned to Cotempla AM, pramipexole  1 mg TID. Tolerating meds now. Patient was administered Spravato 84 mg intranasally today.  The patient experienced the typical dissociation which gradually resolved over the 2-hour period of observation.  There were no complications.  Specifically the patient did not have nausea or vomiting or headache.   Trouble staying asleep longer than 6 hours.  More dep this week and asked to get Spravato twice this week. Plan: Trial for off label TRD increase pramipexole 1 mg QID  10/22/22 appt noted:  Psych meds:  Clonazepam 0.5 mg tablets 2 nightly and another one with EMA, gabapentin dosage varies, Belsomra 20 mg daily,  trazodone 100 mg nightly,  sertaline 15o mg .  Returned to Cotempla AM, pramipexole  1 mg QID for 2nd day Tolerating meds now.  Except a little N and dizzy briefly after takes it. Patient was administered Spravato 84 mg intranasally today.  The patient experienced the typical dissociation which gradually resolved over the 2-hour period of observation.  There were no complications.  Specifically the patient did not  have nausea or vomiting or headache. No change in mood yet.   Sleep better with Belsomra 20 as long as takes with other meds.  If insomnia mood is worse. Ongoing some dep but gets partial relief with Spravato but would like better relief .  10/27/22 received Spravato 84  11/03/22 appt noted:  Meds as above except sertraline she cut to 100 mg instead of 200 mg daily She doesn't think sertraline helping and worries over poss SE Off and on Cotempla .  Not affecting BP. Doesn't notice benefit or SE with pramipexole 1 mg QID.  No SE Sleep is OK.  Some awakening aftter 6 hours but not enough so takes another clonazepam.   Patient was administered Spravato 84 mg intranasally today.  The patient experienced the typical dissociation which gradually resolved over the 2-hour period of observation.  There were no complications.  Specifically the patient did not have nausea or vomiting or headache. No change in mood yet.   Sleep better with Belsomra 20 as long as takes with other meds.  If insomnia mood is worse. Ongoing some dep but gets partial relief with Spravato but would like better relief .  No sig benefit with pramipexole 1 mg QID.  11/21/22 TC:  To ER with dep 11/21/22. No SE with selegiline but feels more dep.  Wants to stop it. Today is only day 2 of 1 tablet of selegiline 5 mg AM.  Reviewed with her that she had not been satisfied with the response of Spravato and Zoloft and that she is likely feeling worse because the Zoloft is wearing off and in particular because she abruptly stopped 150 mg daily.  She needs to give the selegiline time to work.  She agrees to do so.  She admitted to some passive suicidal thoughts but no active suicidal thoughts and no desire to die.  She agrees to the plan to increase selegiline to 10 mg every morning this weekend and to continue the selegiline trial.  She cannot stop this and immediately start another AD due to risk DDI with serotonin syndrome.  She is aware.   Heather Staggers, MD, DFAPA  12/19/22 TC: Pt called at 1:42p.  She said she wanted to know if she could start weaning off the Selegiline.  She thinks increasing the dosage is making things worse.  She said she wants to go back to [employer].  She acknowleged she has an appt on Monday. I advised her that I wasn't sure Dr Jennelle Human will see her message before her appt, but she still ask me to send the message.  Next appt 8/26 MD resp.  No change until Monday appt.  12/22/22 appt noted: Meds: selegiline up to 5 mg 4 daily for a week but felt it incr irritability and went back to 3 mg daily. She wants to go back to Spravato.  Not near as dep as now.  Selegiline didn't help energy. Still on Belsomra Thinks maybe sertraline helped mood more than she thought. More dep than anxiety.   Plan: DC selegiline and will retry Effexor which helped in the past at 300 mg daily.  Start this Friday after over 10 half lives of selegiline.    12/24/22 appt noted: Stopped selegiline  Psych med: belsomra 20, clonazepam 1 mg HS.   She has felt more dep off the Spravato though at the time didn't think it was helping.  Has felt more down and withdrawn.  Low energy, motivation, not smiling.  No SI.  Sleep ok.   Patient was administered Spravato 84 mg intranasally today.  The patient experienced the typical dissociation which gradually resolved over the 2-hour period of observation.  There were no complications.  Specifically the patient did not have nausea or vomiting or headache. No problems with BP or other unusual SE Mood is a little better after this administration of Spravato.  Very motivated to continue it.  No immediate med concerns. No SI .  Ongoing anhedonia.  12/25/22 appt noted: Psych med: belsomra 20, clonazepam 1 mg HS.  Not resumed stimulant or AD yet   No SE. Patient was administered Spravato 84 mg intranasally today.  The patient experienced the typical dissociation which gradually resolved over the 2-hour period of  observation.  There were no complications.  Specifically the patient did not have nausea or vomiting or headache.  There is a lot things going on in her life and it is somewhat difficult to separate stressors from what is going on with her mood.  However she is happy to resume Spravato as she decided after stopping briefly that it was helpful.  She plans to start the antidepressant [place].  She also wants to resume the stimulant tomorrow.  12/30/22 appt noted; Psych med: belsomra 20, clonazepam 1 mg HS.  Not resumed stimulant.  Effexor XR 37.5 mg daily   No SE. Patient was administered Spravato 84 mg intranasally today.  The patient experienced the typical dissociation which gradually resolved over the 2-hour period of observation.  There were no complications.  Specifically the patient did not have nausea or vomiting or headache.  She is still having depression with very prominent anhedonia.  However she feels the Spravato is somewhat helpful and is hopeful for more complete response as she gets back onto a reasonable antidepressant dose.  She tolerated Effexor XR 300 mg daily in the past and took it for 5 months and it is probably the antidepressants worked best for her.  She is agreeable with this plan.  She is getting sleep benefit from the current medications Plan: incr Effexor 75 mg daily  01/02/23 appt noted: Psych meds as noted above except increase Effexor XR to 75 mg daily without side effects. Patient was administered Spravato 84 mg intranasally today.  The patient experienced the typical dissociation which gradually resolved over the 2-hour period of observation.  There were no complications.  Specifically the patient did not have nausea or vomiting or headache.  Agrees to increasing Effexor as planned.  SX not changed from that noted above.  No SI  01/05/23 appt noted: Psych meds as noted above except increase Effexor XR to 112.5 mg daily without side effects. Patient was administered  Spravato 84 mg intranasally today.  The patient experienced the typical dissociation which gradually resolved over the 2-hour period of observation.  There were no complications.  Specifically the patient did not have nausea or vomiting or headache.  Agrees to increasing Effexor as planned gradually to 300 mg daily.  SX not changed from that noted above.  Does however feel Spravato has dampened the degree of depression.  No SI Stressful life event  discussed and won't be resolved until early next year.  01/07/23 appt noted: Psych meds Effexor XR 112.5 mg daily. Patient was administered Spravato 84 mg intranasally today.  The patient experienced the typical dissociation which gradually resolved over the 2-hour period of observation.  There were no complications.  Specifically the patient did not have nausea or vomiting or headache.  Agrees to increasing Effexor as planned gradually to 300 mg daily.  SX not changed from that noted above.  Does however feel Spravato has dampened the degree of depression markedly compared to when off psych meds and no Spravato.  No SI  01/12/23 appt noted; Psych meds Effexor XR 225 mg daily. No SE notedl. Patient was administered Spravato 84 mg intranasally today.  The patient experienced the typical dissociation which gradually resolved over the 2-hour period of observation.  There were no complications.  Specifically the patient did not have nausea or vomiting.  But is having headache.  Agrees to increasing Effexor as planned gradually to 300 mg daily.  SX not changed from that noted above.  Does however feel Spravato has dampened the degree of depression markedly compared to when off psych meds and no Spravato.  No SI  01/15/23 appt noted:  Psych meds Effexor XR 225 mg daily. No SE noted. Patient was administered Spravato 84 mg intranasally today.  The patient experienced the typical dissociation which gradually resolved over the 2-hour period of observation.  There  were no complications.  Specifically the patient did not have nausea or vomiting.   Doing ok with higher doses of Effexor and will plan to increase to 300 mg daily. Mild improvement noticed so far with the incrase in meds.   Uses Cotempla prn and it helps but doesn't liek the way she feels when it wears off.  01/22/23 appt noted: Psych meds reduced Effexor XR 150 mg daily. Bc of HA No SE noted. Patient was administered Spravato 84 mg intranasally today.  The patient experienced the typical dissociation which gradually resolved over the 2-hour period of observation.  There were no complications.  Specifically the patient did not have nausea or vomiting.  Ongoing moderate levels of depression with some improvement from the Spravato.  She is not sure about the effect of the venlafaxine on her mood yet.  She would like to continue to try pursuing a stimulant but has had difficulty tolerating p.o. versions of stimulants because of the crash at the end and side effects with many of them.  She has several questions about this.  01/27/23 appt noted: Psych meds: Clonazepam 1 to 1.5 mg nightly, lithium CR 300 nightly, Belsomra 20 nightly, venlafaxine XR 150 every morning No side effects noted at the current dosage. Patient was administered Spravato 84 mg intranasally today.  The patient experienced the typical dissociation which gradually resolved over the 2-hour period of observation.  There were no complications.  Specifically the patient did not have nausea or vomiting.  Ongoing moderate levels of depression with some improvement from the Spravato.   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  She does not tolerate oral stimulants very well because she has a crash as they wear off which is very dysphoric.  She is willing to retry the Daytrana patches that she took in the spring.  She took 1 patch on 1 occasion recently and felt it was  helpful. However she is also having problems with early morning awakening over the  last week without precipitant.  She would like to have a different treatment strategy for sleep but understands her sleep problems have historically been difficult to resolve and maintain.  02/10/23 appt noted: Psych meds: Clonazepam 1 to 1.5 mg nightly, lithium CR 300 nightly, Belsomra 20 nightly, venlafaxine XR 150 every morning, Daytrana 30 mg Am No side effects noted at the current dosage. Patient was administered Spravato 84 mg intranasally today.  The patient experienced the typical dissociation which gradually resolved over the 2-hour period of observation.  There were no complications.  Specifically the patient did not have nausea or vomiting.  Ongoing moderate levels of depression with some improvement from the Spravato.  Particularly anhedonia.  No sig benefit noted with Daytrana and problems with table stimulants bc gets brief benefit with unpleasant "crash".  Wants to try another type of patch if available.   AIMS    Flowsheet Row Video Visit from 01/03/2022 in [facility]  AIMS Total Score 0      ECT-MADRS    Flowsheet Row Clinical Support from 08/13/2022 in [facility] Video Visit from 04/29/2022 in [facility] Video Visit from 02/06/2022 in [facility]  MADRS Total Score 27 44 23      GAD-7    Flowsheet Row Counselor from 12/19/2022 in [facility] Office Visit from 08/22/2022 in [facility] [place]  Total GAD-7 Score 4 7      PHQ2-9    Flowsheet Row Counselor from 12/19/2022 in [facility] Office Visit from 08/22/2022 in [facility] [place] Office Visit from 08/19/2022 in [facility] at [facility] Video Visit from 02/06/2022 in [facility]  [facility] Office Visit from [facility]/16/[facility] Healthcare at [facility] Total Score 2 3 0 6 0  PHQ-9 Total Score 5 5 -- 14 --        Past Psychiatric Medication Trials: Trintellix - Initially effective and then was not effective when re-started Paxil- Ineffective. Helped initially. Prozac-Insomnia Lexapro- Effective and then no longer as effective Zoloft- Initially effective and then minimal response Viibryd Cymbalta- Ineffective. Helped initially. Effexor XR 300- Effective, well tolerated. Pristiq- Increased anxiety at 150 mg daily Wellbutrin XL-Ineffective.May have increased anxiety. Amitriptyline Nortriptyline-Caused irritability, constipation Auvelity- ineffective Selegilne brief 20 mg daily , more irritable  Abilify- Helpful but caused severe insomnia. Rexulti-Helpful but caused insomnia. Vraylar Seroquel - Ineffective Risperdal Olanzapine- Ineffective Latuda- Ineffective Caplyta  Klonopin- Effective Temazepam- Took during menopause Lunesta- Ineffective Ambien-Ineffective Sonata- Ineffective Dayvigo- partially effective  Belsomra 20 worked and then failed  Trazodone-  somewhat effective Remeron-Ineffective. Does not recall wt gain.  Quetiapine 25 NR  Adderall- Took once and had adverse effect. Concerta- Effective but only 4-6 hours Jornay 80 NR Metadate-Not as effective as Concerta Dexmethylphenidate- Not as effective as Concerta Azstarys- some side effects. Not as effective.  Ritalin short acting and SE anxiety & HTN @20  TID Cotemplay 17.3  Daytrana NR  Lamictal- May have had an adverse effects. "Felt weird." Reports worsening s/s.  Lithium ? effect Gabapentin Pramipexole- 1 mg QID NR  ECT #20 with minimal response. H says it was partly helpful.  AIMS    Flowsheet Row Video Visit from 01/03/2022 in [facility]  AIMS Total Score 0      ECT-MADRS    Flowsheet  Row Clinical Support from 08/13/2022 in [facility] Video Visit from 04/29/2022 in [facility] Video Visit from 02/06/2022 in [facility]  MADRS Total Score 27 44 23      GAD-7    Flowsheet Row Counselor from 12/19/2022 in [facility] Office Visit from 08/22/2022 in [facility] [place]  Total GAD-7 Score 4 7      PHQ2-9    Flowsheet Row Counselor from 12/19/2022 in [facility] Office Visit from 08/22/2022 in [facility] [place] Office Visit from 08/19/2022 in [facility] Healthcare at [facility] Video Visit from 02/06/2022 in [facility] Office Visit from 11/16/2020 in [facility] at [facility]  PHQ-2 Total Score 2 3 0 6 0  PHQ-9 Total Score 5 5 -- 14 --        Review of Systems:  Review of Systems  Constitutional:  Positive for fatigue.  Cardiovascular:  Negative for chest pain and palpitations.  Neurological:  Positive for headaches. Negative for dizziness and tremors.  Psychiatric/Behavioral:  Positive for dysphoric mood and sleep disturbance. Negative for decreased concentration and suicidal ideas. The patient is nervous/anxious.     Medications: I have reviewed the patient's current medications.  Current Outpatient Medications  Medication Sig Dispense Refill   Dextroamphetamine (XELSTRYM) 13.5 MG/9HR PTCH Place 1 patch onto the skin every morning. 30 patch 0   Esketamine HCl, 84 MG Dose, (SPRAVATO, 84 MG DOSE,) 28 MG/DEVICE SOPK Place 84 mg into the nose every 7 (seven) days. 3 each 5   Estradiol (VAGIFEM) 10 MCG TABS vaginal tablet Place 1 tablet (10 mcg total) vaginally 2 (two) times a week. 24 tablet 3   lithium carbonate (LITHOBID) 300 MG ER tablet Take 1 tablet (300 mg total) by mouth at bedtime.  90 tablet 0   methylphenidate (DAYTRANA) 30 MG/9HR Place 1 patch onto the skin daily. wear patch for 9 hours only each day 30 patch 0   Suvorexant (BELSOMRA) 20 MG TABS TAKE 1 TABLET BY MOUTH AT BEDTIME 30 tablet 2   temazepam (RESTORIL) 30 MG capsule Take 1 capsule (30 mg total) by mouth at bedtime as needed for sleep. 30 capsule 0   valACYclovir (VALTREX) 500 MG tablet Take one twice daily for 3 days with any symptoms 30 tablet 3   valsartan (DIOVAN) 80 MG tablet Take 1 tablet (80 mg total) by mouth daily. 90 tablet 3   venlafaxine XR (EFFEXOR-XR) 75 MG 24 hr capsule Take 3 capsules (225 mg total) by mouth daily. (Patient taking differently: Take 150 mg by mouth daily.) 270 capsule 0   No current facility-administered medications for this visit.    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   TRANSAMINASES, SERUM, ELEVATED 08/14/2009    Past Medical History, Surgical history, Social history, and Family history were reviewed and updated as appropriate.   Please see review of systems for further details on the patient's review  from today.   Objective:   Physical Exam:  LMP 12/27/2007 (Exact Date)   Physical Exam Constitutional:      General: She is not in acute distress. Musculoskeletal:        General: No deformity.  Neurological:     Mental Status: She is alert and oriented to person, place, and time.     Coordination: Coordination normal.  Psychiatric:        Attention and Perception: Perception normal. She is inattentive. She does not perceive auditory hallucinations.        Mood and Affect: Mood is anxious and depressed.  Affect is blunt.        Speech: Speech normal.        Behavior: Behavior normal.        Thought Content: Thought content normal. Thought content is not delusional. Thought content does not include homicidal or suicidal ideation. Thought content does not include suicidal plan.        Cognition and Memory: Cognition and memory normal.        Judgment: Judgment normal.     Comments: Insight intact Mood still mod dep but better with Spravato.  She is doing better overall. Affect blunted but with a little more expression today     Lab Review:     Component Value Date/Time   NA 132 (L) 10/27/2022 1107   K 4.4 10/27/2022 1107   CL 95 (L) 10/27/2022 1107   CO2 30 10/27/2022 1107   GLUCOSE 89 10/27/2022 1107   BUN 14 10/27/2022 1107   CREATININE 0.67 10/27/2022 1107   CREATININE 0.68 01/26/2020 0920   CALCIUM 10.1 10/27/2022 1107   PROT 8.3 04/22/2022 0932   ALBUMIN 5.3 (H) 04/22/2022 0932   AST 82 (H) 04/22/2022 0932   ALT 118 (H) 04/22/2022 0932   ALKPHOS 74 04/22/2022 0932   BILITOT 0.3 04/22/2022 0932   GFRNONAA >90 05/01/2014 1645   GFRAA >90 05/01/2014 1645       Component Value Date/Time   WBC 3.3 (L) 04/22/2022 0932   RBC 4.25 04/22/2022 0932   HGB 14.3 04/22/2022 0932   HGB 14.1 10/26/2014 1034   HCT 40.7 04/22/2022 0932   PLT 245.0 04/22/2022 0932   MCV 95.7 04/22/2022 0932   MCH 34.4 (H) 01/26/2020 0920   MCHC 35.1 04/22/2022 0932   RDW 13.2 04/22/2022 0932   LYMPHSABS 0.9 04/22/2022 0932   MONOABS 0.2 04/22/2022 0932   EOSABS 0.1 04/22/2022 0932   BASOSABS 0.0 04/22/2022 0932    No results found for: "POCLITH", "LITHIUM"   No results found for: "PHENYTOIN", "PHENOBARB", "VALPROATE", "CBMZ"   .res Assessment: Plan:    Diagnoses and all orders for this visit:  Recurrent major depression resistant to treatment [facility])  Attention deficit hyperactivity disorder (ADHD), predominantly inattentive type -     Dextroamphetamine (XELSTRYM) 13.5 MG/9HR PTCH; Place  1 patch onto the skin every morning.  Generalized anxiety disorder  Insomnia, unspecified type     Pt seen for TRD with multiple failed medications as noted above.  Some of these failures have been related to an adequate dose and duration as she has had a tendency to get frustrated with medication trials.TRD need 6 week trial of AD at full dose at least.   She has had no problems with the transition which has been started from selegiline to Effexor XR. But doesn't think she can tolerate 300 mg Effexor right now bc HA developed.  Patient was administered Spravato 84  mg intranasally today.  The patient experienced the typical dissociation which gradually resolved over the 2-hour period of observation.  There were no complications.  Specifically the patient did not have nausea or vomiting or headache.  Blood pressures remained within normal ranges at the 40-minute and 2-hour follow-up intervals.  By the time the 2-hour observation period was met the patient was alert and oriented and able to exit without assistance.  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  See nursing note for further details.  Consider switch Spravato to MAOI, [place]  We discussed the short-term risks associated with benzodiazepines including sedation and increased fall risk among others.  Discussed long-term side effect risk including dependence, potential withdrawal symptoms, and the potential eventual dose-related risk of dementia.  But recent studies from 2020 dispute this association between benzodiazepines and dementia risk. Newer studies in 2020 do not support an association with dementia.  for insomnia, reports doing ok now with clonazepam 1.5 mg nightly.  She wants to continue Belsomra if it is not effective.  Cotempla or Concerta bc helps mood early in day and focus  benefit lasts longer.  Have not been able to get stimulant  to be consitently effective throughout the day. Conisder switching to patch bc pt has significant crash with stimulants or perhaps [place].  Stimulant tablets cause crash with inadequate duration .  Patches should provide smoother response. Reports no response to Daytrana for mood and limited  Will try another patch, Xelstrym   Discussed potential benefits, risks, and side effects of stimulants with patient to include increased heart rate, palpitations, insomnia, increased anxiety, increased irritability, or decreased appetite.  Instructed patient to contact office if experiencing any significant tolerability issues.  Increase again venlafaxine XR to 225 mg daily.    Disc pretreating Spravato with NSAID or Tylenol bc of HA lately.  Supportive therapy dealing with serious stressful life event.  FU twice weekly Spravato until next week then try to drop back to weekly .  Heather Staggers, MD, DFAPA    Please see After Visit Summary for patient specific instructions.  Future Appointments  Date Time Provider Department Center  02/16/2023  1:00 PM Gaspar Bidding [facility] CP-CP None  02/19/2023  9:00 AM Cottle, Steva Ready., MD CP-CP None  02/19/2023  9:00 AM CP-NURSE CP-CP None  02/23/2023  1:00 PM Gaspar Bidding, [facility] CP-CP None  03/02/2023  2:00 PM Gaspar Bidding, [facility] CP-CP None  03/11/2023  9:00 AM Gaspar Bidding, [facility] CP-CP None  03/18/2023 11:00 AM Gaspar Bidding, [facility] CP-CP None  03/24/2023  9:00 AM Gaspar Bidding, [facility] CP-CP None  09/18/2023  8:15 AM Jerene Bears, MD [facility]    No orders of the defined types were placed in this encounter.   -------------------------------

## 2023-02-10 NOTE — Progress Notes (Signed)
NURSES NOTE:           Patient arrived for her #55 Spravato treatment. Pt is coming once a week now. Pt is being treated for Treatment Resistant Depression, pt will be receiving 84 mg (3 of the 28 mg) nasal sprays, this is her maintenance dose. Patient taken to treatment room, I explained and discussed her treatment and how the schedule should go, along with any side effects that may occur. Answered any questions and concerns the patient had. Pt's Spravato is delivered through [place] and she is now billing with her insurance. Spravato medication is stored at doctors office per REMS/FDA guidelines. The medication is required to be locked behind two doors per FDA/REMS Protocol. Medication is also disposed of properly per regulations. All treatments and her vital signs are documented in Spravato REMS per protocol of treatment center regulations. [facility] delivers her medication.      Vital Signs assessed at 8:05 AM 99/54, pulse 67, pulse ox 92%. Instructed pt to blow her nose and recline back slightly to prevent any of medication dripping out of her nose.   Pt. Given 1st dose (28 mg inhaler), then 5 minutes between 2nd dose and 3rd dose for total of 84 mg. No complaints of nausea/vomiting reported. Pt did listen to music today, she just reclined back and closed her eyes. After 40 minutes I went to assess her vital signs, no side effects or symptoms reported, B/P 111/67, pulse 62. Dr. Jennelle Human talked to pt today about her treatment. Nurse was with pt a total of 60 minutes but pt observed for 120 minutes per protocol. Discharge vital signs were at 10:00 AM, B/P 110/74, pulse 71. She verbalized understanding. She reports dissociation but she was clear upon her discharge. She is scheduled next week for Thursday, 24th October.  She is instructed to contact office if needing anything prior to her next treatment.     LOT # [id number]   EXP FEB 2027

## 2023-02-12 ENCOUNTER — Encounter: Payer: Self-pay | Admitting: Psychiatry

## 2023-02-12 NOTE — Progress Notes (Signed)
Heather Davila [id number] 1960/06/06 62 y.o.  Subjective:   Patient ID:  Heather Davila is a 62 y.o. (DOB 31-Aug-1960) female.  Chief Complaint:  Chief Complaint  Patient presents with   Follow-up   Depression   Anxiety   ADD   Sleeping Problem    HPI [name] presents to the office today for follow-up of treatment resistant recurrent depression, generalized anxiety disorder and insomnia.  Almost too numerous to count med failures. She is here for Spravato administration for her treatment resistant depression.  05/21/22 appt noted: Patient was administered first dose Spravato 56 mg intranasally today.  The patient experienced the typical dissociation which gradually resolved over the 2-hour period of observation.  There were no complications.  Specifically the patient did not have nausea or vomiting or headache.   Dissociation was mild. Did not experience any emotional relief from disabling depression.wants to increase Spravato to the usual dose.  She is aware insurance is not covering the costs at this time.  No SI acutely. Tolerating meds.    05/23/22 appt noted: Patient was administered Spravato 84 mg intranasally today.  The patient experienced the typical dissociation which gradually resolved over the 2-hour period of observation.  There were no complications.  Specifically the patient did not have nausea or vomiting.   Dissociation was greater than last dose and a little bothersome DT some HA. Tolerating meds.   Mood has not changed significantly thus far with Spravato.  05/27/22 appt noted: Patient was administered Spravato 84 mg intranasally today.  The patient experienced the typical dissociation which gradually resolved over the 2-hour period of observation.  There were no complications.  Specifically the patient did not have nausea or vomiting or headache. Today she has felt a little relief from the pressing heaviness of depression but a little more anxiety.  During  administration of Spravato she listens to [place] music and was able to relax a little more with the feeling of dissociation that was mildly bothersome. Husband was also in on the session today and asked some questions about IV ketamine versus nasal spray ketamine.  05/29/22 appt noted: Cancelled today DT hypertension  05/30/22 appt noted: Patient was administered Spravato 84 mg intranasally today.  The patient experienced the typical dissociation which gradually resolved over the 2-hour period of observation.  There were no complications.  Specifically the patient did not have nausea or vomiting or headache. She is seeing some improvement in mood with less continuous depression and less intensity.  Hopefulness is better.   No med complaints or SE  06/05/22 appt noted: Patient was administered Spravato 84 mg intranasally today.  The patient experienced the typical dissociation which gradually resolved over the 2-hour period of observation.  There were no complications.  Specifically the patient did not have nausea or vomiting or headache.  Specifically HA is less of a problem with Spravato. No SE with meds. Depression is improving with less intensity.  Intermittent anxiety easily.  More able to enjoy things.  No new concerns.  06/17/22 appt noted: Patient was administered Spravato 84 mg intranasally today.  The patient experienced the typical dissociation which gradually resolved over the 2-hour period of observation.  There were no complications.  Specifically the patient did not have nausea or vomiting or headache.  Specifically HA is less of a problem with Spravato. No SE with meds. Depression is improved 45% with less intensity.  Intermittent anxiety less easily.  More able to enjoy things.  No new concerns.  More  active. Current meds: increased clonazepam to about 1.5 mg HS DT recent insomnia, lexapro 20, Dayvigo 10 mg HS, concerta 36 mg AM, trazodone 100 mg HS.  06/20/22 appt noted: Current  meds: increased clonazepam to about 1.5 mg HS DT recent insomnia, lexapro 20, Dayvigo 10 mg HS, concerta 36 mg AM, trazodone 100 mg HS. Patient was administered Spravato 84 mg intranasally today.  The patient experienced the typical dissociation which gradually resolved over the 2-hour period of observation.  There were no complications.  Specifically the patient did not have nausea or vomiting or headache.  Specifically HA is less of a problem with Spravato. No SE with meds. Depression is improved 45% with less intensity.  Intermittent anxiety less easily.  More able to enjoy things.  No new concerns.  More active.  06/23/22 appt noted:  Current meds: increased clonazepam to about 1.5 mg HS DT recent insomnia, lexapro 20, Dayvigo 10 mg HS, concerta 36 mg AM, trazodone 100 mg HS. Patient was administered Spravato 84 mg intranasally today.  The patient experienced the typical dissociation which gradually resolved over the 2-hour period of observation.  There were no complications.  Specifically the patient did not have nausea or vomiting or headache.  Specifically HA is less of a problem with Spravato. No SE with meds. Intensity of Spravato dissociation varies.  Last week very intesne and this week more typical.  Depression is continuing to improve with better interest and activity and motivation.  Gradual improvement.  Wants to continue.  06/25/22 appt noted: Current meds: increased clonazepam to about 1.5 mg HS DT recent insomnia, lexapro 20, Dayvigo 10 mg HS, concerta 36 mg AM, trazodone 100 mg HS. Patient was administered Spravato 84 mg intranasally today.  The patient experienced the typical dissociation which gradually resolved over the 2-hour period of observation.  There were no complications.  Specifically the patient did not have nausea or vomiting or headache.  Specifically HA is less of a problem with Spravato. No SE with meds. Intensity of Spravato dissociation varies.  No scary and well  tolerated. Continues to gradually improve with Spravato.  She is more motivated and enjoying things more.  H sees progress.  Wants to continue twice weekly until gets maximum response.  Dep is not gone yet.  06/30/22 appt noted:  seen with H Current meds: increased clonazepam to about 1.5 mg HS DT recent insomnia, lexapro 20, Dayvigo 10 mg HS, concerta 36 mg AM, trazodone 100 mg HS. Patient was administered Spravato 84 mg intranasally today.  The patient experienced the typical dissociation which gradually resolved over the 2-hour period of observation.  There were no complications.  Specifically the patient did not have nausea or vomiting or headache.  Specifically HA is less of a problem with Spravato. No SE with meds. Intensity of Spravato dissociation varies.  No scary and well tolerated. Continues to gradually improve with Spravato.  She is more motivated and enjoying things more.  H sees even more progress than she does; more active and smiling.  Wants to continue twice weekly until gets maximum response.  Dep is 80% better.  07/07/22 appt noted: Current meds: increased clonazepam to about 1.5 mg HS DT recent insomnia, lexapro 20, Dayvigo 10 mg HS, concerta 36 mg AM, trazodone 100 mg HS. Patient was administered Spravato 84 mg intranasally today.  The patient experienced the typical dissociation which gradually resolved over the 2-hour period of observation.  There were no complications.  Specifically the patient did not have  nausea or vomiting or headache.  Specifically HA is less of a problem with Spravato. No SE with meds. Intensity of Spravato dissociation varies.  No scary and well tolerated.  Was more intese today. Overall mood is markedly better and please with meds. No changes desired.  07/09/22 appt noted: In transition from Lexapro to sertraline and missing Concerta bc CO crash from it after about 4 hours.  Disc these concerns.  She'll discuss also with Corie Chiquito, NP More dep the  last couple of days and concerned about reducing Spravato frequency while transition from one antidep to another.  Affect less positive. Patient was administered Spravato 84 mg intranasally today.  The patient experienced the typical dissociation which gradually resolved over the 2-hour period of observation.  There were no complications.  Specifically the patient did not have nausea or vomiting or headache.  Specifically HA is less of a problem with Spravato. No SE with meds. Intensity of Spravato dissociation varies.  No scary and well tolerated.   07/14/22 appt noted: Has been more depressed this week.  More trouble with sleep.  Taking clonazepam 1-1.5  of the 0.5 mg tablets.   Trazodone not helping much. She switched back from 200 sertraline to Lexapro 20.  No SE More trouble with motivation.  Some stress with son with special needs. Misses the benevit of Concerta but it was too short acting and crashed in 4-6 hours. Patient was administered Spravato 84 mg intranasally today.  The patient experienced the typical dissociation which gradually resolved over the 2-hour period of observation.  There were no complications.  Specifically the patient did not have nausea or vomiting or headache.  Specifically HA is less of a problem with Spravato. No SE with meds. Intensity of Spravato dissociation varies.  No scary and well tolerated.   07/17/22 appt noted: Patient was administered Spravato 84 mg intranasally today.  The patient experienced the typical dissociation which gradually resolved over the 2-hour period of observation.  There were no complications.  Specifically the patient did not have nausea or vomiting or headache.  Specifically HA is less of a problem with Spravato. No SE with meds. Intensity of Spravato dissociation varies.  Not scary and well tolerated.  Just got Jornay last night but wasn't sure how to take it.  Wants to start and this was discussed.  She felt MPH helped mood and attn but  didn't last long enough and had crash after a few hours on Concerta.  Better for 2 hours after each dose Ritalin 20 mg with mood but then it wore off.  BP up after 3rd dose 160/90 and then took H's amlodipine 5 and BP became normal.  07/22/22 appt noted: Patient was administered Spravato 84 mg intranasally today.  The patient experienced the typical dissociation which gradually resolved over the 2-hour period of observation.  There were no complications.  Specifically the patient did not have nausea or vomiting or headache.  Specifically HA is less of a problem with Spravato. No SE with meds. Intensity of Spravato dissociation varies.  Not scary and well tolerated.  Start Jornay 20 mg over the weekend and did not notice any particular effect good or bad.  Increased to 40 mg. Other psych meds: Clonazepam 0.5 mg tablets 2 nightly, gabapentin dosage varies, Dayvigo 10 mg nightly, Lexapro 20 mg daily, to trazodone 100 mg nightly  07/24/22 appt noted: Patient was administered Spravato 84 mg intranasally today.  The patient experienced the typical dissociation which gradually resolved  over the 2-hour period of observation.  There were no complications.  Specifically the patient did not have nausea or vomiting or headache.  Specifically HA is less of a problem with Spravato. No SE with meds. Intensity of Spravato dissociation varies.  Not scary and well tolerated.  Other psych meds: Clonazepam 0.5 mg tablets 2 nightly, gabapentin dosage varies, Dayvigo 10 mg nightly, Lexapro 20 mg daily, to trazodone 100 mg nightly, Jornay 40 mg PM Doing well with Spravato.  Noticed a little effect from the Jornay 40 without SE but would like to increase the dose for ADD and mood.  Sleeping well.  No new concerns.  Still more depressed than she was with th initial benefit of Spravato.  07/30/22 note:  Patient was administered Spravato 84 mg intranasally today.  The patient experienced the typical dissociation which gradually  resolved over the 2-hour period of observation.  There were no complications.  Specifically the patient did not have nausea or vomiting or headache.  Specifically HA is less of a problem with Spravato. Other psych meds: Clonazepam 0.5 mg tablets 2 nightly, gabapentin dosage varies, Dayvigo 10 mg nightly, Lexapro 20 mg daily, to trazodone 100 mg nightly, Jornay 60 mg PM Wants to increase Jornay to 80 mg PM to increase benefit for ADD and depression. No SE She has experienced a major family crisis that threatens to change her daily life from now on.  It has not triggered suicidal thoughts at this time but she is very afraid.  No desire to change medications.  08/04/22 appt noted" Patient was administered Spravato 84 mg intranasally today.  The patient experienced the typical dissociation which gradually resolved over the 2-hour period of observation.  There were no complications.  Specifically the patient did not have nausea or vomiting or headache.  Specifically HA is less of a problem with Spravato. Other psych meds: Clonazepam 0.5 mg tablets 2 nightly, gabapentin dosage varies, Dayvigo 10 mg nightly, Lexapro 20 mg daily, trazodone 100 mg nightly, Jornay 80 mg PM No SE No benefit or SE with increase Jormay 80 mg.  Says Concerta helped ADD and depression but not Jornay.  However duration was insufficient.  Still depressed and wants change to another form of stimulant.  No concerns with other meds. Continues with strong family stressors creating uncertainty about her future but no quick resolution. No SI Trial Cotempla 17.3 and DC Ritalin & Jornay 80  08/11/2022 appointment noted: Patient was administered Spravato 84 mg intranasally today.  The patient experienced the typical dissociation which gradually resolved over the 2-hour period of observation.  There were no complications.  Specifically the patient did not have nausea or vomiting or headache.  Specifically HA is less of a problem with Spravato now  vs when started it.. Other psych meds: Clonazepam 0.5 mg tablets 2 nightly, gabapentin dosage varies, Dayvigo 10 mg nightly, trazodone 100 mg nightly, cotempla 17.3, switch from Lexapro 20 to sertaline 15o mg . No noticeable effects sertraline from for depression but has seen some antianxiety effects from the sertraline.  No side effects noted.  No side effects with the other medicines.  Still is only getting very brief benefit 3 to 4 hours from Cotempla for ADD and mood.  She wants to try the Daytrana patch as we have discussed before. No SE  08/13/22 appt: Patient was administered Spravato 84 mg intranasally today.  The patient experienced the typical dissociation which gradually resolved over the 2-hour period of observation.  There were no  complications.  Specifically the patient did not have nausea or vomiting or headache.  Specifically HA is less of a problem with Spravato now vs when started it.. Other psych meds: Clonazepam 0.5 mg tablets 2 nightly, gabapentin dosage varies, Dayvigo 10 mg nightly, trazodone 100 mg nightly, cotempla 17.3, switch from Lexapro 20 to sertaline 15o mg . No noticeable effects sertraline from for depression but has seen some antianxiety effects from the sertraline.  No side effects noted.  No side effects with the other medicines.  Still is only getting very brief benefit 3 to 4 hours from Cotempla for ADD and mood.  She wants to try the Daytrana patch as we have discussed before.  Hasn't been able to get it yet. No SE Plan.  Continue to try to get Daytrana 30 AM  08/18/22 appt noted: Patient was administered Spravato 84 mg intranasally today.  The patient experienced the typical dissociation which gradually resolved over the 2-hour period of observation.  There were no complications.  Specifically the patient did not have nausea or vomiting or headache.  Specifically HA is less of a problem with Spravato now vs when started it.. Other psych meds: Clonazepam 0.5 mg  tablets 2 nightly, gabapentin dosage varies, Dayvigo 10 mg nightly, trazodone 100 mg nightly, cotempla 17.3, switch from Lexapro 20 to sertaline 15o mg . No noticeable effects sertraline from for depression but has seen some antianxiety effects from the sertraline.  No side effects noted.  No side effects with the other medicines.  Still is only getting very brief benefit 3 to 4 hours from Cotempla for ADD and mood.  She wants to try the Daytrana patch as we have discussed before.  Hasn't been able to get it yet.  Overall depression is some better than it was.   No SE Plan.  Continue to try to get Daytrana 30 AM  08/25/22 appt : No benefit Daytrana.  No SE.  Wants further med changes and interested In retrying pramipexole.   Tolerating meds. Patient was administered Spravato 84 mg intranasally today.  The patient experienced the typical dissociation which gradually resolved over the 2-hour period of observation.  There were no complications.  Specifically the patient did not have nausea or vomiting or headache.  Specifically HA is less of a problem with Spravato now vs when started it.. Other psych meds: Clonazepam 0.5 mg tablets 2 nightly, gabapentin dosage varies, Dayvigo 10 mg nightly, trazodone 100 mg nightly, , switch from Lexapro 20 to sertaline 15o mg . Daytrana 30 AM for a few days. Struggles with depression and no SI.  Reduced interest and mood and motivation and enjoyment and socialization.  08/27/22 appt noted: Psych meds:  Clonazepam 0.5 mg tablets 2 nightly, gabapentin dosage varies, Dayvigo 10 mg nightly, trazodone 100 mg nightly,  sertaline 15o mg . Returned to [employer] AM, pramipexole  Tolerating meds. Patient was administered Spravato 84 mg intranasally today.  The patient experienced the typical dissociation which gradually resolved over the 2-hour period of observation.  There were no complications.  Specifically the patient did not have nausea or vomiting or headache.   Reports  taking cotempla bc brief mood benefit and sig focus and productivity benefit. Took 1 dose pramipexole and felt more dep.  09/01/22 appt noted: Psych meds:  Clonazepam 0.5 mg tablets 2 nightly, gabapentin dosage varies, Dayvigo 10 mg nightly, trazodone 100 mg nightly,  sertaline 15o mg . Returned to Cotempla AM, pramipexole  0.25 mg BID. Tolerating meds now. Patient  was administered Spravato 84 mg intranasally today.  The patient experienced the typical dissociation which gradually resolved over the 2-hour period of observation.  There were no complications.  Specifically the patient did not have nausea or vomiting or headache.   Willing to try the pramipexole and will continue 0.25 mg BID this week and then consider increase if tolerated.  Mood is a little better.  Still looking for further improvement.  09/03/22 appt noted: Psych meds:  Clonazepam 0.5 mg tablets 2 nightly, gabapentin dosage varies, Dayvigo 10 mg nightly, trazodone 100 mg nightly,  sertaline 15o mg . Returned to Cotempla AM, pramipexole  0.25 mg tablet 1 and 1/2 tablets twice daily BID. Tolerating meds now. Patient was administered Spravato 84 mg intranasally today.  The patient experienced the typical dissociation which gradually resolved over the 2-hour period of observation.  There were no complications.  Specifically the patient did not have nausea or vomiting or headache.   Depression may be a little better with the parmipexole and is tolerating it well so far.  Still struggling with focus and residual depression.  Tolerating meds without SE.  More hopeful and able to enjoy some things.  09/08/22 appt  Psych meds:  Clonazepam 0.5 mg tablets 2 nightly, gabapentin dosage varies, Dayvigo 10 mg nightly, trazodone 100 mg nightly,  sertaline 15o mg . Returned to Cotempla AM, pramipexole  0.5 mg BID. Tolerating meds now. Patient was administered Spravato 84 mg intranasally today.  The patient experienced the typical dissociation which  gradually resolved over the 2-hour period of observation.  There were no complications.  Specifically the patient did not have nausea or vomiting or headache.   She has seen her depression drop from a 10/10 prior to treatment with Spravato to now 4/10 With additional benefit noted when she increased the pramipexole to 0.5 mg twice daily.  She is not having side effects with this like she did in the past. Anxiety levels are also improved. She is sleeping okay with the current medications. Plan: continue pramipexole trial off-label and increase more slowly for TRD.  Increase  Gradually  to 0.75 mg BID  09/11/22 appt noted: Psych meds:  Clonazepam 0.5 mg tablets 2 nightly, gabapentin dosage varies, Dayvigo 10 mg nightly, trazodone 100 mg nightly,  sertaline 15o mg . Returned to Cotempla AM, pramipexole  0.75 mg BID. Tolerating meds now. Patient was administered Spravato 84 mg intranasally today.  The patient experienced the typical dissociation which gradually resolved over the 2-hour period of observation.  There were no complications.  Specifically the patient did not have nausea or vomiting or headache.   She has seen her depression drop from a 10/10 prior to treatment with Spravato to now 4/10 With additional benefit noted when she increased the pramipexole to 0.5 mg twice daily.  She is not having side effects with this like she did in the past. Clearly improving with the pramipexole now and tolerating it.  Satisfied with current meds but would consider increasing if it might be helpful.  09/15/22 appt noted: Psych meds:  Clonazepam 0.5 mg tablets 2 nightly, gabapentin dosage varies, Dayvigo 10 mg nightly, trazodone 100 mg nightly,  sertaline 15o mg . Returned to Cotempla AM, pramipexole  0.75 mg BID too anxious and reduced to 0.5 mg BID. Tolerating meds now. Patient was administered Spravato 84 mg intranasally today.  The patient experienced the typical dissociation which gradually resolved over  the 2-hour period of observation.  There were no complications.  Specifically  the patient did not have nausea or vomiting or headache.   Trouble staying asleep longer than 6 hours.  Doesn't feel she can tolerate pramipexole 0.75 mg BID DT anxiety not experienced at  0.5 mg BID.  Otherwise tolerating meds.  Mood is better with Spravato and pramipexole.   Not sleeping as long with meds.  EMA 6 hours.  Wonders about change Disc concerns about waiting for therapy. Feels ready to reduce Spravato to weekly.    09/25/22 appt noted: Psych meds:  Clonazepam 0.5 mg tablets 2 nightly, gabapentin dosage varies, Dayvigo 10 mg nightly, trazodone 100 mg nightly,  sertaline 15o mg . Returned to Cotempla AM, pramipexole  0.75 mg BID too anxious and reduced to 0.5 mg BID. Tolerating meds now. Patient was administered Spravato 84 mg intranasally today.  The patient experienced the typical dissociation which gradually resolved over the 2-hour period of observation.  There were no complications.  Specifically the patient did not have nausea or vomiting or headache.   Trouble staying asleep longer than 6 hours.  Failed to respond to Seroquel 25 mg HS.  Gone back to [facility].  Belsomra NR. Mood ok until the last few days more dep bc it's been 10 days since last admin.  Wants to continue weekly.   No SE.  No med changes desired.  09/29/22 appt noted: Psych meds:  Clonazepam 0.5 mg tablets 2 nightly, gabapentin dosage varies, Dayvigo 10 mg nightly, trazodone 100 mg nightly,  sertaline 15o mg . Returned to Cotempla AM, pramipexole  0.75 mg BID too anxious and reduced to 0.5 mg BID. Tolerating meds now. Patient was administered Spravato 84 mg intranasally today.  The patient experienced the typical dissociation which gradually resolved over the 2-hour period of observation.  There were no complications.  Specifically the patient did not have nausea or vomiting or headache.   Trouble staying asleep longer than 6 hours.  Plan:  Continue sertaline to 200 mg daily.  It has helped anxiety but not depression.   for insomnia, clonazepam 1 mg HS. trazodone doesn't work well but helps some Continue Dayvigo 10 HS.  Failed resp Belsomra 15 and seroquel 25 She wants to continue Cotempla or Concerta bc helps mood early in day and focus  benefit lasts longer.  Have not been able to get stimulant to be consitently effective throughout the day. continue pramipexole trial off-label and reduce back to  TRD 0.5 mg BID Spravato weekly.  10/13/22 appt noted: Not sleeping as well.  Wants to increase Spravato to twice weekly bc feeling more dep close to the next sched date.  Just the day or 2 before.  .  Plan: Trial for off label TRD increase pramipexole 1 mg TID  10/20/22 appt noted: Psych meds:  Clonazepam 0.5 mg tablets 2 nightly, gabapentin dosage varies, Dayvigo 10 mg nightly, trazodone 100 mg nightly,  sertaline 15o mg . Returned to Cotempla AM, pramipexole  1 mg TID. Tolerating meds now. Patient was administered Spravato 84 mg intranasally today.  The patient experienced the typical dissociation which gradually resolved over the 2-hour period of observation.  There were no complications.  Specifically the patient did not have nausea or vomiting or headache.   Trouble staying asleep longer than 6 hours.  More dep this week and asked to get Spravato twice this week. Plan: Trial for off label TRD increase pramipexole 1 mg QID  10/22/22 appt noted:  Psych meds:  Clonazepam 0.5 mg tablets 2 nightly and another one with  EMA, gabapentin dosage varies, Belsomra 20 mg daily,  trazodone 100 mg nightly,  sertaline 15o mg . Returned to Cotempla AM, pramipexole  1 mg QID for 2nd day Tolerating meds now.  Except a little N and dizzy briefly after takes it. Patient was administered Spravato 84 mg intranasally today.  The patient experienced the typical dissociation which gradually resolved over the 2-hour period of observation.  There were no  complications.  Specifically the patient did not have nausea or vomiting or headache. No change in mood yet.   Sleep better with Belsomra 20 as long as takes with other meds.  If insomnia mood is worse. Ongoing some dep but gets partial relief with Spravato but would like better relief .  10/27/22 received Spravato 84  11/03/22 appt noted:  Meds as above except sertraline she cut to 100 mg instead of 200 mg daily She doesn't think sertraline helping and worries over poss SE Off and on Cotempla .  Not affecting BP. Doesn't notice benefit or SE with pramipexole 1 mg QID.  No SE Sleep is OK.  Some awakening aftter 6 hours but not enough so takes another clonazepam.   Patient was administered Spravato 84 mg intranasally today.  The patient experienced the typical dissociation which gradually resolved over the 2-hour period of observation.  There were no complications.  Specifically the patient did not have nausea or vomiting or headache. No change in mood yet.   Sleep better with Belsomra 20 as long as takes with other meds.  If insomnia mood is worse. Ongoing some dep but gets partial relief with Spravato but would like better relief .  No sig benefit with pramipexole 1 mg QID.  11/21/22 TC:  To ER with dep 11/21/22. No SE with selegiline but feels more dep.  Wants to stop it. Today is only day 2 of 1 tablet of selegiline 5 mg AM.  Reviewed with her that she had not been satisfied with the response of Spravato and Zoloft and that she is likely feeling worse because the Zoloft is wearing off and in particular because she abruptly stopped 150 mg daily.  She needs to give the selegiline time to work.  She agrees to do so.  She admitted to some passive suicidal thoughts but no active suicidal thoughts and no desire to die.  She agrees to the plan to increase selegiline to 10 mg every morning this weekend and to continue the selegiline trial.  She cannot stop this and immediately start another AD due to risk  DDI with serotonin syndrome.  She is aware.  Meredith Staggers, MD, DFAPA  12/19/22 TC: Pt called at 1:42p.  She said she wanted to know if she could start weaning off the Selegiline.  She thinks increasing the dosage is making things worse.  She said she wants to go back to [employer].  She acknowleged she has an appt on Monday. I advised her that I wasn't sure Dr Jennelle Human will see her message before her appt, but she still ask me to send the message.  Next appt 8/26 MD resp.  No change until Monday appt.  12/22/22 appt noted: Meds: selegiline up to 5 mg 4 daily for a week but felt it incr irritability and went back to 3 mg daily. She wants to go back to Spravato.  Not near as dep as now.  Selegiline didn't help energy. Still on Belsomra Thinks maybe sertraline helped mood more than she thought. More dep than anxiety.  Plan: DC selegiline and will retry Effexor which helped in the past at 300 mg daily.  Start this Friday after over 10 half lives of selegiline.    12/24/22 appt noted: Stopped selegiline  Psych med: belsomra 20, clonazepam 1 mg HS.   She has felt more dep off the Spravato though at the time didn't think it was helping.  Has felt more down and withdrawn.  Low energy, motivation, not smiling.  No SI.  Sleep ok.   Patient was administered Spravato 84 mg intranasally today.  The patient experienced the typical dissociation which gradually resolved over the 2-hour period of observation.  There were no complications.  Specifically the patient did not have nausea or vomiting or headache. No problems with BP or other unusual SE Mood is a little better after this administration of Spravato.  Very motivated to continue it.  No immediate med concerns. No SI .  Ongoing anhedonia.  12/25/22 appt noted: Psych med: belsomra 20, clonazepam 1 mg HS.  Not resumed stimulant or AD yet   No SE. Patient was administered Spravato 84 mg intranasally today.  The patient experienced the typical dissociation  which gradually resolved over the 2-hour period of observation.  There were no complications.  Specifically the patient did not have nausea or vomiting or headache.  There is a lot things going on in her life and it is somewhat difficult to separate stressors from what is going on with her mood.  However she is happy to resume Spravato as she decided after stopping briefly that it was helpful.  She plans to start the antidepressant Morrow.  She also wants to resume the stimulant tomorrow.  12/30/22 appt noted; Psych med: belsomra 20, clonazepam 1 mg HS.  Not resumed stimulant.  Effexor XR 37.5 mg daily   No SE. Patient was administered Spravato 84 mg intranasally today.  The patient experienced the typical dissociation which gradually resolved over the 2-hour period of observation.  There were no complications.  Specifically the patient did not have nausea or vomiting or headache.  She is still having depression with very prominent anhedonia.  However she feels the Spravato is somewhat helpful and is hopeful for more complete response as she gets back onto a reasonable antidepressant dose.  She tolerated Effexor XR 300 mg daily in the past and took it for 5 months and it is probably the antidepressants worked best for her.  She is agreeable with this plan.  She is getting sleep benefit from the current medications Plan: incr Effexor 75 mg daily  01/02/23 appt noted: Psych meds as noted above except increase Effexor XR to 75 mg daily without side effects. Patient was administered Spravato 84 mg intranasally today.  The patient experienced the typical dissociation which gradually resolved over the 2-hour period of observation.  There were no complications.  Specifically the patient did not have nausea or vomiting or headache.  Agrees to increasing Effexor as planned.  SX not changed from that noted above.  No SI  01/05/23 appt noted: Psych meds as noted above except increase Effexor XR to 112.5 mg daily  without side effects. Patient was administered Spravato 84 mg intranasally today.  The patient experienced the typical dissociation which gradually resolved over the 2-hour period of observation.  There were no complications.  Specifically the patient did not have nausea or vomiting or headache.  Agrees to increasing Effexor as planned gradually to 300 mg daily.  SX not changed from that noted above.  Does however feel Spravato has dampened the degree of depression.  No SI Stressful life event discussed and won't be resolved until early next year.  01/07/23 appt noted: Psych meds Effexor XR 112.5 mg daily. Patient was administered Spravato 84 mg intranasally today.  The patient experienced the typical dissociation which gradually resolved over the 2-hour period of observation.  There were no complications.  Specifically the patient did not have nausea or vomiting or headache.  Agrees to increasing Effexor as planned gradually to 300 mg daily.  SX not changed from that noted above.  Does however feel Spravato has dampened the degree of depression markedly compared to when off psych meds and no Spravato.  No SI  01/12/23 appt noted; Psych meds Effexor XR 225 mg daily. No SE notedl. Patient was administered Spravato 84 mg intranasally today.  The patient experienced the typical dissociation which gradually resolved over the 2-hour period of observation.  There were no complications.  Specifically the patient did not have nausea or vomiting.  But is having headache.  Agrees to increasing Effexor as planned gradually to 300 mg daily.  SX not changed from that noted above.  Does however feel Spravato has dampened the degree of depression markedly compared to when off psych meds and no Spravato.  No SI  01/15/23 appt noted:  Psych meds Effexor XR 225 mg daily. No SE noted. Patient was administered Spravato 84 mg intranasally today.  The patient experienced the typical dissociation which gradually resolved  over the 2-hour period of observation.  There were no complications.  Specifically the patient did not have nausea or vomiting.   Doing ok with higher doses of Effexor and will plan to increase to 300 mg daily. Mild improvement noticed so far with the incrase in meds.   Uses Cotempla prn and it helps but doesn't liek the way she feels when it wears off.  01/22/23 appt noted: Psych meds reduced Effexor XR 150 mg daily. Bc of HA No SE noted. Patient was administered Spravato 84 mg intranasally today.  The patient experienced the typical dissociation which gradually resolved over the 2-hour period of observation.  There were no complications.  Specifically the patient did not have nausea or vomiting.  Ongoing moderate levels of depression with some improvement from the Spravato.  She is not sure about the effect of the venlafaxine on her mood yet.  She would like to continue to try pursuing a stimulant but has had difficulty tolerating p.o. versions of stimulants because of the crash at the end and side effects with many of them.  She has several questions about this.  01/27/23 appt noted: Psych meds: Clonazepam 1 to 1.5 mg nightly, lithium CR 300 nightly, Belsomra 20 nightly, venlafaxine XR 150 every morning No side effects noted at the current dosage. Patient was administered Spravato 84 mg intranasally today.  The patient experienced the typical dissociation which gradually resolved over the 2-hour period of observation.  There were no complications.  Specifically the patient did not have nausea or vomiting.  Ongoing moderate levels of depression with some improvement from the Spravato.   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  She does not tolerate oral stimulants very well because she has a crash as they wear off which is very dysphoric.  She is willing to retry the Daytrana patches that she took in the spring.  She took 1 patch on  1 occasion recently and  felt it was helpful. However she is also having problems with early morning awakening over the last week without precipitant.  She would like to have a different treatment strategy for sleep but understands her sleep problems have historically been difficult to resolve and maintain.  02/04/23 appt noted: Psych meds: Clonazepam 1 to 1.5 mg nightly, lithium CR 300 nightly, Belsomra 20 nightly, venlafaxine XR 150 every morning No side effects noted at the current dosage. Patient was administered Spravato 84 mg intranasally today.  The patient experienced the typical dissociation which gradually resolved over the 2-hour period of observation.  There were no complications.  Specifically the patient did not have nausea or vomiting.  Ongoing moderate levels of depression with some improvement from the Spravato.   Still feels flat and some dep but better with Spravato.  No noticeable benefit noted with stimulants of any duration.  But would liketo continue to try stimulant.     AIMS    Flowsheet Row Video Visit from 01/03/2022 in [facility]  AIMS Total Score 0      ECT-MADRS    Flowsheet Row Clinical Support from 08/13/2022 in [facility] Video Visit from 04/29/2022 in [facility] Video Visit from 02/06/2022 in [facility]  MADRS Total Score 27 44 23      GAD-7    Flowsheet Row Counselor from 12/19/2022 in [facility] Office Visit from 08/22/2022 in [facility] [place]  Total GAD-7 Score 4 7      PHQ2-9    Flowsheet Row Counselor from 12/19/2022 in [facility] Office Visit from 08/22/2022 in [facility] [place] Office Visit from 08/19/2022 in [facility] Video Visit from 02/06/2022 in [facility] Visit from 11/16/2020 in [facility] Total Score 2 3 0 6 0  PHQ-9 Total Score 5 5 -- 14 --        Past Psychiatric Medication Trials: Trintellix - Initially effective and then was not effective when re-started Paxil- Ineffective. Helped initially. Prozac-Insomnia Lexapro- Effective and then no longer as effective Zoloft- Initially effective and then minimal response Viibryd Cymbalta- Ineffective. Helped initially. Effexor XR 300- Effective, well tolerated. Pristiq- Increased anxiety at 150 mg daily Wellbutrin XL-Ineffective.May have increased anxiety. Amitriptyline Nortriptyline-Caused irritability, constipation Auvelity- ineffective Selegilne brief 20 mg daily , more irritable  Abilify- Helpful but caused severe insomnia. Rexulti-Helpful but caused insomnia. Vraylar Seroquel - Ineffective Risperdal Olanzapine- Ineffective Latuda- Ineffective Caplyta  Klonopin- Effective Temazepam- Took during menopause Lunesta- Ineffective Ambien-Ineffective Sonata- Ineffective Dayvigo- partially effective  Belsomra 20 worked and then failed  Trazodone-  somewhat effective Remeron-Ineffective. Does not recall wt gain.  Quetiapine 25 NR  Adderall- Took once and had adverse effect. Concerta- Effective but only 4-6 hours Jornay 80 NR Metadate-Not as effective as Concerta Dexmethylphenidate- Not as effective as Concerta Azstarys- some side effects. Not as effective.  Ritalin short acting and SE anxiety & HTN @20  TID Cotemplay 17.3  Daytrana  Lamictal- May have had an adverse effects. "Felt weird." Reports worsening s/s.  Lithium ? effect Gabapentin Pramipexole- 1 mg QID NR  ECT #20 with minimal response. H says it was partly helpful.  AIMS    Flowsheet Row Video Visit from 01/03/2022 in [facility]  AIMS Total Score 0  ECT-MADRS    Flowsheet Row Clinical  Support from 08/13/2022 in [facility] Video Visit from 04/29/2022 in [facility] Video Visit from 02/06/2022 in [facility]  MADRS Total Score 27 44 23      GAD-7    Flowsheet Row Counselor from 12/19/2022 in [facility] Office Visit from 08/22/2022 in [facility] [place]  Total GAD-7 Score 4 7      PHQ2-9    Flowsheet Row Counselor from 12/19/2022 in [facility] Office Visit from 08/22/2022 in [facility] [place] Office Visit from 08/19/2022 in [facility] Video Visit from 02/06/2022 in [facility] Office Visit from 11/16/2020 in [facility]  PHQ-2 Total Score 2 3 0 6 0  PHQ-9 Total Score 5 5 -- 14 --        Review of Systems:  Review of Systems  Cardiovascular:  Negative for chest pain and palpitations.  Neurological:  Positive for headaches. Negative for dizziness and tremors.  Psychiatric/Behavioral:  Positive for decreased concentration, dysphoric mood and sleep disturbance. Negative for suicidal ideas. The patient is nervous/anxious.     Medications: I have reviewed the patient's current medications.  Current Outpatient Medications  Medication Sig Dispense Refill   Esketamine HCl, 84 MG Dose, (SPRAVATO, 84 MG DOSE,) 28 MG/DEVICE SOPK Place 84 mg into the nose every 7 (seven) days. 3 each 5   Estradiol (VAGIFEM) 10 MCG TABS vaginal tablet Place 1 tablet (10 mcg total) vaginally 2 (two) times a week. 24 tablet 3   lithium carbonate (LITHOBID) 300 MG ER tablet Take 1 tablet (300 mg total) by mouth at bedtime. 90 tablet 0   methylphenidate (DAYTRANA) 30 MG/9HR Place 1 patch onto the skin daily. wear patch for 9 hours only each day 30 patch 0   Suvorexant (BELSOMRA)  20 MG TABS TAKE 1 TABLET BY MOUTH AT BEDTIME 30 tablet 2   temazepam (RESTORIL) 30 MG capsule Take 1 capsule (30 mg total) by mouth at bedtime as needed for sleep. 30 capsule 0   valACYclovir (VALTREX) 500 MG tablet Take one twice daily for 3 days with any symptoms 30 tablet 3   valsartan (DIOVAN) 80 MG tablet Take 1 tablet (80 mg total) by mouth daily. 90 tablet 3   venlafaxine XR (EFFEXOR-XR) 75 MG 24 hr capsule Take 3 capsules (225 mg total) by mouth daily. (Patient taking differently: Take 150 mg by mouth daily.) 270 capsule 0   No current facility-administered medications for this visit.    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   TRANSAMINASES, SERUM, ELEVATED 08/14/2009    Past Medical History, Surgical history, Social history, and Family history were reviewed and updated as appropriate.   Please see review of systems for further details on the patient's review from today.   Objective:   Physical Exam:  LMP 12/27/2007 (Exact Date)   Physical Exam Constitutional:  General: She is not in acute distress. Musculoskeletal:        General: No deformity.  Neurological:     Mental Status: She is alert and oriented to person, place, and time.     Coordination: Coordination normal.  Psychiatric:        Attention and Perception: Attention and perception normal. She does not perceive auditory hallucinations.        Mood and Affect: Mood is anxious and depressed. Affect is blunt. Affect is not tearful.        Speech: Speech normal.        Behavior: Behavior normal.        Thought Content: Thought content normal. Thought content  is not delusional. Thought content does not include homicidal or suicidal ideation. Thought content does not include suicidal plan.        Cognition and Memory: Cognition and memory normal.        Judgment: Judgment normal.     Comments: Insight intact Mood still mod dep but better with Spravato.  She is doing better overall. Affect blunted but with a little more expression     Lab Review:     Component Value Date/Time   NA 132 (L) 10/27/2022 1107   K 4.4 10/27/2022 1107   CL 95 (L) 10/27/2022 1107   CO2 30 10/27/2022 1107   GLUCOSE 89 10/27/2022 1107   BUN 14 10/27/2022 1107   CREATININE 0.67 10/27/2022 1107   CREATININE 0.68 01/26/2020 0920   CALCIUM 10.1 10/27/2022 1107   PROT 8.3 04/22/2022 0932   ALBUMIN 5.3 (H) 04/22/2022 0932   AST 82 (H) 04/22/2022 0932   ALT 118 (H) 04/22/2022 0932   ALKPHOS 74 04/22/2022 0932   BILITOT 0.3 04/22/2022 0932   GFRNONAA >90 05/01/2014 1645   GFRAA >90 05/01/2014 1645       Component Value Date/Time   WBC 3.3 (L) 04/22/2022 0932   RBC 4.25 04/22/2022 0932   HGB 14.3 04/22/2022 0932   HGB 14.1 10/26/2014 1034   HCT 40.7 04/22/2022 0932   PLT 245.0 04/22/2022 0932   MCV 95.7 04/22/2022 0932   MCH 34.4 (H) 01/26/2020 0920   MCHC 35.1 04/22/2022 0932   RDW 13.2 04/22/2022 0932   LYMPHSABS 0.9 04/22/2022 0932   MONOABS 0.2 04/22/2022 0932   EOSABS 0.1 04/22/2022 0932   BASOSABS 0.0 04/22/2022 0932    No results found for: "POCLITH", "LITHIUM"   No results found for: "PHENYTOIN", "PHENOBARB", "VALPROATE", "CBMZ"   .res Assessment: Plan:    Delene was seen today for follow-up, depression, anxiety, add and sleeping problem.  Diagnoses and all orders for this visit:  Recurrent major depression resistant to treatment (HCC)  Generalized anxiety disorder  Attention deficit hyperactivity disorder (ADHD), predominantly inattentive type  Insomnia, unspecified type    Pt seen for TRD with multiple failed medications as noted  above.  Some of these failures have been related to an adequate dose and duration as she has had a tendency to get frustrated with medication trials.TRD need 6 week trial of AD at full dose at least.   She has had no problems with the transition which has been started from selegiline to Effexor XR. But doesn't think she can tolerate 300 mg Effexor right now bc HA developed.  Patient was administered Spravato 84 mg intranasally today.  The patient experienced the typical dissociation which gradually resolved over the 2-hour period of observation.  There were no complications.  Specifically the patient  did not have nausea or vomiting or headache.  Blood pressures remained within normal ranges at the 40-minute and 2-hour follow-up intervals.  By the time the 2-hour observation period was met the patient was alert and oriented and able to exit without assistance.  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  See nursing note for further details.  Consider switch Spravato to MAOI, Parnate  We discussed the short-term risks associated with benzodiazepines including sedation and increased fall risk among others.  Discussed long-term side effect risk including dependence, potential withdrawal symptoms, and the potential eventual dose-related risk of dementia.  But recent studies from 2020 dispute this association between benzodiazepines and dementia risk. Newer studies in 2020 do not support an association with dementia.  for insomnia, it appears she has become tolerant to clonazepam even at doses of 1.5 nightly. She's returned to clonazepam with some benefit.  DC Belsomra if it is not effective. She thinks it is making some difference.  Cotempla or Concerta bc helps mood early in day and focus  benefit lasts longer.  Have not been able to get stimulant to be consitently effective throughout the day. Conisder switching  to patch bc pt has significant crash with stimulants or perhaps Jornay Continue trial of Daytrana 30 mg every morning but not noticing a lot with this.   Discussed potential benefits, risks, and side effects of stimulants with patient to include increased heart rate, palpitations, insomnia, increased anxiety, increased irritability, or decreased appetite.  Instructed patient to contact office if experiencing any significant tolerability issues.  Retry increase venlafaxine XR to 225 mg .  No HA now.    Disc pretreating Spravato with NSAID or Tylenol bc of HA lately.  Supportive therapy dealing with serious stressful life event.  FU twice weekly Spravato until next week then try to drop back to weekly .  Meredith Staggers, MD, DFAPA    Please see After Visit Summary for patient specific instructions.  Future Appointments  Date Time Provider Department Center  02/16/2023  1:00 PM Gaspar Bidding [facility] CP-CP None  02/19/2023  9:00 AM Cottle, Steva Ready., MD CP-CP None  02/19/2023  9:00 AM CP-NURSE CP-CP None  02/23/2023  1:00 PM Gaspar Bidding, [facility] CP-CP None  03/02/2023  2:00 PM Gaspar Bidding, [facility] CP-CP None  03/11/2023  9:00 AM Gaspar Bidding, [facility] CP-CP None  03/18/2023 11:00 AM Gaspar Bidding, [facility] CP-CP None  03/24/2023  9:00 AM Gaspar Bidding, [facility] CP-CP None  09/18/2023  8:15 AM Jerene Bears, MD [facility]    No orders of the defined types were placed in this encounter.   -------------------------------

## 2023-02-13 MED ORDER — XELSTRYM 13.5 MG/9HR TD PTCH: 1.0000 | MEDICATED_PATCH | Freq: Every morning | TRANSDERMAL | 0 refills | Status: DC

## 2023-02-16 ENCOUNTER — Encounter: Payer: Self-pay | Admitting: Professional Counselor

## 2023-02-16 ENCOUNTER — Ambulatory Visit: Payer: 59 | Admitting: Professional Counselor

## 2023-02-16 DIAGNOSIS — F339 Major depressive disorder, recurrent, unspecified: Secondary | ICD-10-CM | POA: Diagnosis not present

## 2023-02-16 NOTE — Progress Notes (Signed)
      [facility] Counselor/Therapist Progress Note  Patient ID: Heather Davila, MRN: [id number],    Date: 02/16/2023  Time Spent: 1:07p - 2:06p   Treatment Type: Individual Therapy  Reported Symptoms: sleeplessness, grief/loss, sadness, low self esteem  Mental Status Exam:  Appearance:   Neat     Behavior:  Appropriate and Sharing  Motor:  Normal  Speech/Language:   Clear and Coherent and Normal Rate  Affect:  Appropriate and Congruent  Mood:  normal  Thought process:  normal  Thought content:    WNL  Sensory/Perceptual disturbances:    WNL  Orientation:  Sound  Attention:  Good  Concentration:  Good  Memory:  WNL  Fund of knowledge:   Good  Insight:    Good  Judgment:   Good  Impulse Control:  Good   Risk Assessment: Danger to Self:  No Self-injurious Behavior: No Danger to Others: No Duty to Warn:no Physical Aggression / Violence:No  Access to Firearms a concern: No  Gang Involvement:No   Subjective: Pt presented to session to address presenting concern of depression per symptoms above. She voiced progress in terms of ongoing reflection and helpfulness of therapeutic work, however still contending with symptomology. Counselor assisted pt with goal of processing grief and loss, alleviating symptoms of low self esteem and guilt, and developing coping skills. Counselor helped to normalize grief with pt, and offered psychoeducation about the personal nature of it that does not follow a linear or predicable path per se. Counselor and pt discussed how pt is hard on herself and about her grieving, feeling that she is not as apt to want to open up about it as other loved ones, for example. Counselor facilitated a self care inventory and self compassion scale (SCS-SF) and discussed results with pt; pt exhibited self insight. Pt reflected on counselor prompts regarding pt compassion for others as opposed to self compassion, and how her values and faith might align to extend to  herself. Counselor and pt discussed how pt might nurture her sense of self worth by empowering herself to shift thinking per CBT inetevrntion: noticing, interrupting and changing automatic thoughts.   Interventions: Cognitive Behavioral Therapy, Solution-Oriented/Positive Psychology, Humanistic/Existential, Grief Therapy, and Insight-Oriented  Diagnosis:   ICD-10-CM   1. Recurrent major depression resistant to treatment [facility])  F33.9       Plan: Pt is scheduled for a follow-up; continue process work and developing coping skills. Pt to engage with grief homework if she desires.   Gaspar Bidding, [facility]

## 2023-02-17 ENCOUNTER — Ambulatory Visit (INDEPENDENT_AMBULATORY_CARE_PROVIDER_SITE_OTHER): Payer: 59 | Admitting: Psychiatry

## 2023-02-17 ENCOUNTER — Encounter: Payer: Self-pay | Admitting: Psychiatry

## 2023-02-17 ENCOUNTER — Ambulatory Visit: Payer: 59

## 2023-02-17 VITALS — BP 130/85 | HR 78

## 2023-02-17 DIAGNOSIS — F900 Attention-deficit hyperactivity disorder, predominantly inattentive type: Secondary | ICD-10-CM | POA: Diagnosis not present

## 2023-02-17 DIAGNOSIS — F339 Major depressive disorder, recurrent, unspecified: Secondary | ICD-10-CM

## 2023-02-17 DIAGNOSIS — F411 Generalized anxiety disorder: Secondary | ICD-10-CM

## 2023-02-17 DIAGNOSIS — G4700 Insomnia, unspecified: Secondary | ICD-10-CM

## 2023-02-17 MED ORDER — BELSOMRA 20 MG PO TABS: 1.0000 | ORAL_TABLET | Freq: Every day | ORAL | 0 refills | Status: DC

## 2023-02-17 MED ORDER — CLONAZEPAM 1 MG PO TABS: 1.0000 mg | ORAL_TABLET | Freq: Every day | ORAL | 0 refills | Status: DC

## 2023-02-17 NOTE — Progress Notes (Signed)
[name] [id number] 1960-08-16 62 y.o.  Subjective:   Patient ID:  Heather Davila is a 62 y.o. (DOB [birth year]) female.  Chief Complaint:  Chief Complaint  Patient presents with   Follow-up   Depression   Anxiety   ADD   Sleeping Problem    HPI [name] presents to the office today for follow-up of treatment resistant recurrent depression, generalized anxiety disorder and insomnia.  Almost too numerous to count med failures. She is here for Spravato administration for her treatment resistant depression.  05/21/22 appt noted: Patient was administered first dose Spravato 56 mg intranasally today.  The patient experienced the typical dissociation which gradually resolved over the 2-hour period of observation.  There were no complications.  Specifically the patient did not have nausea or vomiting or headache.   Dissociation was mild. Did not experience any emotional relief from disabling depression.wants to increase Spravato to the usual dose.  She is aware insurance is not covering the costs at this time.  No SI acutely. Tolerating meds.    05/23/22 appt noted: Patient was administered Spravato 84 mg intranasally today.  The patient experienced the typical dissociation which gradually resolved over the 2-hour period of observation.  There were no complications.  Specifically the patient did not have nausea or vomiting.   Dissociation was greater than last dose and a little bothersome DT some HA. Tolerating meds.   Mood has not changed significantly thus far with Spravato.  05/27/22 appt noted: Patient was administered Spravato 84 mg intranasally today.  The patient experienced the typical dissociation which gradually resolved over the 2-hour period of observation.  There were no complications.  Specifically the patient did not have nausea or vomiting or headache. Today she has felt a little relief from the pressing heaviness of depression but a little more anxiety.  During  administration of Spravato she listens to [place] music and was able to relax a little more with the feeling of dissociation that was mildly bothersome. Husband was also in on the session today and asked some questions about IV ketamine versus nasal spray ketamine.  05/29/22 appt noted: Cancelled today DT hypertension  05/30/22 appt noted: Patient was administered Spravato 84 mg intranasally today.  The patient experienced the typical dissociation which gradually resolved over the 2-hour period of observation.  There were no complications.  Specifically the patient did not have nausea or vomiting or headache. She is seeing some improvement in mood with less continuous depression and less intensity.  Hopefulness is better.   No med complaints or SE  06/05/22 appt noted: Patient was administered Spravato 84 mg intranasally today.  The patient experienced the typical dissociation which gradually resolved over the 2-hour period of observation.  There were no complications.  Specifically the patient did not have nausea or vomiting or headache.  Specifically HA is less of a problem with Spravato. No SE with meds. Depression is improving with less intensity.  Intermittent anxiety easily.  More able to enjoy things.  No new concerns.  06/17/22 appt noted: Patient was administered Spravato 84 mg intranasally today.  The patient experienced the typical dissociation which gradually resolved over the 2-hour period of observation.  There were no complications.  Specifically the patient did not have nausea or vomiting or headache.  Specifically HA is less of a problem with Spravato. No SE with meds. Depression is improved 45% with less intensity.  Intermittent anxiety less easily.  More able to enjoy things.  No new concerns.  More  active. Current meds: increased clonazepam to about 1.5 mg HS DT recent insomnia, lexapro 20, Dayvigo 10 mg HS, concerta 36 mg AM, trazodone 100 mg HS.  06/20/22 appt noted: Current  meds: increased clonazepam to about 1.5 mg HS DT recent insomnia, lexapro 20, Dayvigo 10 mg HS, concerta 36 mg AM, trazodone 100 mg HS. Patient was administered Spravato 84 mg intranasally today.  The patient experienced the typical dissociation which gradually resolved over the 2-hour period of observation.  There were no complications.  Specifically the patient did not have nausea or vomiting or headache.  Specifically HA is less of a problem with Spravato. No SE with meds. Depression is improved 45% with less intensity.  Intermittent anxiety less easily.  More able to enjoy things.  No new concerns.  More active.  06/23/22 appt noted:  Current meds: increased clonazepam to about 1.5 mg HS DT recent insomnia, lexapro 20, Dayvigo 10 mg HS, concerta 36 mg AM, trazodone 100 mg HS. Patient was administered Spravato 84 mg intranasally today.  The patient experienced the typical dissociation which gradually resolved over the 2-hour period of observation.  There were no complications.  Specifically the patient did not have nausea or vomiting or headache.  Specifically HA is less of a problem with Spravato. No SE with meds. Intensity of Spravato dissociation varies.  Last week very intesne and this week more typical.  Depression is continuing to improve with better interest and activity and motivation.  Gradual improvement.  Wants to continue.  06/25/22 appt noted: Current meds: increased clonazepam to about 1.5 mg HS DT recent insomnia, lexapro 20, Dayvigo 10 mg HS, concerta 36 mg AM, trazodone 100 mg HS. Patient was administered Spravato 84 mg intranasally today.  The patient experienced the typical dissociation which gradually resolved over the 2-hour period of observation.  There were no complications.  Specifically the patient did not have nausea or vomiting or headache.  Specifically HA is less of a problem with Spravato. No SE with meds. Intensity of Spravato dissociation varies.  No scary and well  tolerated. Continues to gradually improve with Spravato.  She is more motivated and enjoying things more.  H sees progress.  Wants to continue twice weekly until gets maximum response.  Dep is not gone yet.  06/30/22 appt noted:  seen with H Current meds: increased clonazepam to about 1.5 mg HS DT recent insomnia, lexapro 20, Dayvigo 10 mg HS, concerta 36 mg AM, trazodone 100 mg HS. Patient was administered Spravato 84 mg intranasally today.  The patient experienced the typical dissociation which gradually resolved over the 2-hour period of observation.  There were no complications.  Specifically the patient did not have nausea or vomiting or headache.  Specifically HA is less of a problem with Spravato. No SE with meds. Intensity of Spravato dissociation varies.  No scary and well tolerated. Continues to gradually improve with Spravato.  She is more motivated and enjoying things more.  H sees even more progress than she does; more active and smiling.  Wants to continue twice weekly until gets maximum response.  Dep is 80% better.  07/07/22 appt noted: Current meds: increased clonazepam to about 1.5 mg HS DT recent insomnia, lexapro 20, Dayvigo 10 mg HS, concerta 36 mg AM, trazodone 100 mg HS. Patient was administered Spravato 84 mg intranasally today.  The patient experienced the typical dissociation which gradually resolved over the 2-hour period of observation.  There were no complications.  Specifically the patient did not have  nausea or vomiting or headache.  Specifically HA is less of a problem with Spravato. No SE with meds. Intensity of Spravato dissociation varies.  No scary and well tolerated.  Was more intese today. Overall mood is markedly better and please with meds. No changes desired.  07/09/22 appt noted: In transition from Lexapro to sertraline and missing Concerta bc CO crash from it after about 4 hours.  Disc these concerns.  She'll discuss also with Corie Chiquito, NP More dep the  last couple of days and concerned about reducing Spravato frequency while transition from one antidep to another.  Affect less positive. Patient was administered Spravato 84 mg intranasally today.  The patient experienced the typical dissociation which gradually resolved over the 2-hour period of observation.  There were no complications.  Specifically the patient did not have nausea or vomiting or headache.  Specifically HA is less of a problem with Spravato. No SE with meds. Intensity of Spravato dissociation varies.  No scary and well tolerated.   07/14/22 appt noted: Has been more depressed this week.  More trouble with sleep.  Taking clonazepam 1-1.5  of the 0.5 mg tablets.   Trazodone not helping much. She switched back from 200 sertraline to Lexapro 20.  No SE More trouble with motivation.  Some stress with son with special needs. Misses the benevit of Concerta but it was too short acting and crashed in 4-6 hours. Patient was administered Spravato 84 mg intranasally today.  The patient experienced the typical dissociation which gradually resolved over the 2-hour period of observation.  There were no complications.  Specifically the patient did not have nausea or vomiting or headache.  Specifically HA is less of a problem with Spravato. No SE with meds. Intensity of Spravato dissociation varies.  No scary and well tolerated.   07/17/22 appt noted: Patient was administered Spravato 84 mg intranasally today.  The patient experienced the typical dissociation which gradually resolved over the 2-hour period of observation.  There were no complications.  Specifically the patient did not have nausea or vomiting or headache.  Specifically HA is less of a problem with Spravato. No SE with meds. Intensity of Spravato dissociation varies.  Not scary and well tolerated.  Just got Jornay last night but wasn't sure how to take it.  Wants to start and this was discussed.  She felt MPH helped mood and attn but  didn't last long enough and had crash after a few hours on Concerta.  Better for 2 hours after each dose Ritalin 20 mg with mood but then it wore off.  BP up after 3rd dose 160/90 and then took H's amlodipine 5 and BP became normal.  07/22/22 appt noted: Patient was administered Spravato 84 mg intranasally today.  The patient experienced the typical dissociation which gradually resolved over the 2-hour period of observation.  There were no complications.  Specifically the patient did not have nausea or vomiting or headache.  Specifically HA is less of a problem with Spravato. No SE with meds. Intensity of Spravato dissociation varies.  Not scary and well tolerated.  Start Jornay 20 mg over the weekend and did not notice any particular effect good or bad.  Increased to 40 mg. Other psych meds: Clonazepam 0.5 mg tablets 2 nightly, gabapentin dosage varies, Dayvigo 10 mg nightly, Lexapro 20 mg daily, to trazodone 100 mg nightly  07/24/22 appt noted: Patient was administered Spravato 84 mg intranasally today.  The patient experienced the typical dissociation which gradually resolved  over the 2-hour period of observation.  There were no complications.  Specifically the patient did not have nausea or vomiting or headache.  Specifically HA is less of a problem with Spravato. No SE with meds. Intensity of Spravato dissociation varies.  Not scary and well tolerated.  Other psych meds: Clonazepam 0.5 mg tablets 2 nightly, gabapentin dosage varies, Dayvigo 10 mg nightly, Lexapro 20 mg daily, to trazodone 100 mg nightly, Jornay 40 mg PM Doing well with Spravato.  Noticed a little effect from the Jornay 40 without SE but would like to increase the dose for ADD and mood.  Sleeping well.  No new concerns.  Still more depressed than she was with th initial benefit of Spravato.  07/30/22 note:  Patient was administered Spravato 84 mg intranasally today.  The patient experienced the typical dissociation which gradually  resolved over the 2-hour period of observation.  There were no complications.  Specifically the patient did not have nausea or vomiting or headache.  Specifically HA is less of a problem with Spravato. Other psych meds: Clonazepam 0.5 mg tablets 2 nightly, gabapentin dosage varies, Dayvigo 10 mg nightly, Lexapro 20 mg daily, to trazodone 100 mg nightly, Jornay 60 mg PM Wants to increase Jornay to 80 mg PM to increase benefit for ADD and depression. No SE She has experienced a major family crisis that threatens to change her daily life from now on.  It has not triggered suicidal thoughts at this time but she is very afraid.  No desire to change medications.  08/04/22 appt noted" Patient was administered Spravato 84 mg intranasally today.  The patient experienced the typical dissociation which gradually resolved over the 2-hour period of observation.  There were no complications.  Specifically the patient did not have nausea or vomiting or headache.  Specifically HA is less of a problem with Spravato. Other psych meds: Clonazepam 0.5 mg tablets 2 nightly, gabapentin dosage varies, Dayvigo 10 mg nightly, Lexapro 20 mg daily, trazodone 100 mg nightly, Jornay 80 mg PM No SE No benefit or SE with increase Jormay 80 mg.  Says Concerta helped ADD and depression but not Jornay.  However duration was insufficient.  Still depressed and wants change to another form of stimulant.  No concerns with other meds. Continues with strong family stressors creating uncertainty about her future but no quick resolution. No SI Trial Cotempla 17.3 and DC Ritalin & Jornay 80  08/11/2022 appointment noted: Patient was administered Spravato 84 mg intranasally today.  The patient experienced the typical dissociation which gradually resolved over the 2-hour period of observation.  There were no complications.  Specifically the patient did not have nausea or vomiting or headache.  Specifically HA is less of a problem with Spravato now  vs when started it.. Other psych meds: Clonazepam 0.5 mg tablets 2 nightly, gabapentin dosage varies, Dayvigo 10 mg nightly, trazodone 100 mg nightly, cotempla 17.3, switch from Lexapro 20 to sertaline 15o mg . No noticeable effects sertraline from for depression but has seen some antianxiety effects from the sertraline.  No side effects noted.  No side effects with the other medicines.  Still is only getting very brief benefit 3 to 4 hours from Cotempla for ADD and mood.  She wants to try the Daytrana patch as we have discussed before. No SE  08/13/22 appt: Patient was administered Spravato 84 mg intranasally today.  The patient experienced the typical dissociation which gradually resolved over the 2-hour period of observation.  There were no  complications.  Specifically the patient did not have nausea or vomiting or headache.  Specifically HA is less of a problem with Spravato now vs when started it.. Other psych meds: Clonazepam 0.5 mg tablets 2 nightly, gabapentin dosage varies, Dayvigo 10 mg nightly, trazodone 100 mg nightly, cotempla 17.3, switch from Lexapro 20 to sertaline 15o mg . No noticeable effects sertraline from for depression but has seen some antianxiety effects from the sertraline.  No side effects noted.  No side effects with the other medicines.  Still is only getting very brief benefit 3 to 4 hours from Cotempla for ADD and mood.  She wants to try the Daytrana patch as we have discussed before.  Hasn't been able to get it yet. No SE Plan.  Continue to try to get Daytrana 30 AM  08/18/22 appt noted: Patient was administered Spravato 84 mg intranasally today.  The patient experienced the typical dissociation which gradually resolved over the 2-hour period of observation.  There were no complications.  Specifically the patient did not have nausea or vomiting or headache.  Specifically HA is less of a problem with Spravato now vs when started it.. Other psych meds: Clonazepam 0.5 mg  tablets 2 nightly, gabapentin dosage varies, Dayvigo 10 mg nightly, trazodone 100 mg nightly, cotempla 17.3, switch from Lexapro 20 to sertaline 15o mg . No noticeable effects sertraline from for depression but has seen some antianxiety effects from the sertraline.  No side effects noted.  No side effects with the other medicines.  Still is only getting very brief benefit 3 to 4 hours from Cotempla for ADD and mood.  She wants to try the Daytrana patch as we have discussed before.  Hasn't been able to get it yet.  Overall depression is some better than it was.   No SE Plan.  Continue to try to get Daytrana 30 AM  08/25/22 appt : No benefit Daytrana.  No SE.  Wants further med changes and interested In retrying pramipexole.   Tolerating meds. Patient was administered Spravato 84 mg intranasally today.  The patient experienced the typical dissociation which gradually resolved over the 2-hour period of observation.  There were no complications.  Specifically the patient did not have nausea or vomiting or headache.  Specifically HA is less of a problem with Spravato now vs when started it.. Other psych meds: Clonazepam 0.5 mg tablets 2 nightly, gabapentin dosage varies, Dayvigo 10 mg nightly, trazodone 100 mg nightly, , switch from Lexapro 20 to sertaline 15o mg . Daytrana 30 AM for a few days. Struggles with depression and no SI.  Reduced interest and mood and motivation and enjoyment and socialization.  08/27/22 appt noted: Psych meds:  Clonazepam 0.5 mg tablets 2 nightly, gabapentin dosage varies, Dayvigo 10 mg nightly, trazodone 100 mg nightly,  sertaline 15o mg . Returned to [employer] AM, pramipexole  Tolerating meds. Patient was administered Spravato 84 mg intranasally today.  The patient experienced the typical dissociation which gradually resolved over the 2-hour period of observation.  There were no complications.  Specifically the patient did not have nausea or vomiting or headache.   Reports  taking cotempla bc brief mood benefit and sig focus and productivity benefit. Took 1 dose pramipexole and felt more dep.  09/01/22 appt noted: Psych meds:  Clonazepam 0.5 mg tablets 2 nightly, gabapentin dosage varies, Dayvigo 10 mg nightly, trazodone 100 mg nightly,  sertaline 15o mg . Returned to Cotempla AM, pramipexole  0.25 mg BID. Tolerating meds now. Patient  was administered Spravato 84 mg intranasally today.  The patient experienced the typical dissociation which gradually resolved over the 2-hour period of observation.  There were no complications.  Specifically the patient did not have nausea or vomiting or headache.   Willing to try the pramipexole and will continue 0.25 mg BID this week and then consider increase if tolerated.  Mood is a little better.  Still looking for further improvement.  09/03/22 appt noted: Psych meds:  Clonazepam 0.5 mg tablets 2 nightly, gabapentin dosage varies, Dayvigo 10 mg nightly, trazodone 100 mg nightly,  sertaline 15o mg . Returned to Cotempla AM, pramipexole  0.25 mg tablet 1 and 1/2 tablets twice daily BID. Tolerating meds now. Patient was administered Spravato 84 mg intranasally today.  The patient experienced the typical dissociation which gradually resolved over the 2-hour period of observation.  There were no complications.  Specifically the patient did not have nausea or vomiting or headache.   Depression may be a little better with the parmipexole and is tolerating it well so far.  Still struggling with focus and residual depression.  Tolerating meds without SE.  More hopeful and able to enjoy some things.  09/08/22 appt  Psych meds:  Clonazepam 0.5 mg tablets 2 nightly, gabapentin dosage varies, Dayvigo 10 mg nightly, trazodone 100 mg nightly,  sertaline 15o mg . Returned to Cotempla AM, pramipexole  0.5 mg BID. Tolerating meds now. Patient was administered Spravato 84 mg intranasally today.  The patient experienced the typical dissociation which  gradually resolved over the 2-hour period of observation.  There were no complications.  Specifically the patient did not have nausea or vomiting or headache.   She has seen her depression drop from a 10/10 prior to treatment with Spravato to now 4/10 With additional benefit noted when she increased the pramipexole to 0.5 mg twice daily.  She is not having side effects with this like she did in the past. Anxiety levels are also improved. She is sleeping okay with the current medications. Plan: continue pramipexole trial off-label and increase more slowly for TRD.  Increase  Gradually  to 0.75 mg BID  09/11/22 appt noted: Psych meds:  Clonazepam 0.5 mg tablets 2 nightly, gabapentin dosage varies, Dayvigo 10 mg nightly, trazodone 100 mg nightly,  sertaline 15o mg . Returned to Cotempla AM, pramipexole  0.75 mg BID. Tolerating meds now. Patient was administered Spravato 84 mg intranasally today.  The patient experienced the typical dissociation which gradually resolved over the 2-hour period of observation.  There were no complications.  Specifically the patient did not have nausea or vomiting or headache.   She has seen her depression drop from a 10/10 prior to treatment with Spravato to now 4/10 With additional benefit noted when she increased the pramipexole to 0.5 mg twice daily.  She is not having side effects with this like she did in the past. Clearly improving with the pramipexole now and tolerating it.  Satisfied with current meds but would consider increasing if it might be helpful.  09/15/22 appt noted: Psych meds:  Clonazepam 0.5 mg tablets 2 nightly, gabapentin dosage varies, Dayvigo 10 mg nightly, trazodone 100 mg nightly,  sertaline 15o mg . Returned to Cotempla AM, pramipexole  0.75 mg BID too anxious and reduced to 0.5 mg BID. Tolerating meds now. Patient was administered Spravato 84 mg intranasally today.  The patient experienced the typical dissociation which gradually resolved over  the 2-hour period of observation.  There were no complications.  Specifically  the patient did not have nausea or vomiting or headache.   Trouble staying asleep longer than 6 hours.  Doesn't feel she can tolerate pramipexole 0.75 mg BID DT anxiety not experienced at  0.5 mg BID.  Otherwise tolerating meds.  Mood is better with Spravato and pramipexole.   Not sleeping as long with meds.  EMA 6 hours.  Wonders about change Disc concerns about waiting for therapy. Feels ready to reduce Spravato to weekly.    09/25/22 appt noted: Psych meds:  Clonazepam 0.5 mg tablets 2 nightly, gabapentin dosage varies, Dayvigo 10 mg nightly, trazodone 100 mg nightly,  sertaline 15o mg . Returned to Cotempla AM, pramipexole  0.75 mg BID too anxious and reduced to 0.5 mg BID. Tolerating meds now. Patient was administered Spravato 84 mg intranasally today.  The patient experienced the typical dissociation which gradually resolved over the 2-hour period of observation.  There were no complications.  Specifically the patient did not have nausea or vomiting or headache.   Trouble staying asleep longer than 6 hours.  Failed to respond to Seroquel 25 mg HS.  Gone back to [facility].  Belsomra NR. Mood ok until the last few days more dep bc it's been 10 days since last admin.  Wants to continue weekly.   No SE.  No med changes desired.  09/29/22 appt noted: Psych meds:  Clonazepam 0.5 mg tablets 2 nightly, gabapentin dosage varies, Dayvigo 10 mg nightly, trazodone 100 mg nightly,  sertaline 15o mg . Returned to Cotempla AM, pramipexole  0.75 mg BID too anxious and reduced to 0.5 mg BID. Tolerating meds now. Patient was administered Spravato 84 mg intranasally today.  The patient experienced the typical dissociation which gradually resolved over the 2-hour period of observation.  There were no complications.  Specifically the patient did not have nausea or vomiting or headache.   Trouble staying asleep longer than 6 hours.  Plan:  Continue sertaline to 200 mg daily.  It has helped anxiety but not depression.   for insomnia, clonazepam 1 mg HS. trazodone doesn't work well but helps some Continue Dayvigo 10 HS.  Failed resp Belsomra 15 and seroquel 25 She wants to continue Cotempla or Concerta bc helps mood early in day and focus  benefit lasts longer.  Have not been able to get stimulant to be consitently effective throughout the day. continue pramipexole trial off-label and reduce back to  TRD 0.5 mg BID Spravato weekly.  10/13/22 appt noted: Not sleeping as well.  Wants to increase Spravato to twice weekly bc feeling more dep close to the next sched date.  Just the day or 2 before.  .  Plan: Trial for off label TRD increase pramipexole 1 mg TID  10/20/22 appt noted: Psych meds:  Clonazepam 0.5 mg tablets 2 nightly, gabapentin dosage varies, Dayvigo 10 mg nightly, trazodone 100 mg nightly,  sertaline 15o mg . Returned to Cotempla AM, pramipexole  1 mg TID. Tolerating meds now. Patient was administered Spravato 84 mg intranasally today.  The patient experienced the typical dissociation which gradually resolved over the 2-hour period of observation.  There were no complications.  Specifically the patient did not have nausea or vomiting or headache.   Trouble staying asleep longer than 6 hours.  More dep this week and asked to get Spravato twice this week. Plan: Trial for off label TRD increase pramipexole 1 mg QID  10/22/22 appt noted:  Psych meds:  Clonazepam 0.5 mg tablets 2 nightly and another one with  EMA, gabapentin dosage varies, Belsomra 20 mg daily,  trazodone 100 mg nightly,  sertaline 15o mg . Returned to Cotempla AM, pramipexole  1 mg QID for 2nd day Tolerating meds now.  Except a little N and dizzy briefly after takes it. Patient was administered Spravato 84 mg intranasally today.  The patient experienced the typical dissociation which gradually resolved over the 2-hour period of observation.  There were no  complications.  Specifically the patient did not have nausea or vomiting or headache. No change in mood yet.   Sleep better with Belsomra 20 as long as takes with other meds.  If insomnia mood is worse. Ongoing some dep but gets partial relief with Spravato but would like better relief .  10/27/22 received Spravato 84  11/03/22 appt noted:  Meds as above except sertraline she cut to 100 mg instead of 200 mg daily She doesn't think sertraline helping and worries over poss SE Off and on Cotempla .  Not affecting BP. Doesn't notice benefit or SE with pramipexole 1 mg QID.  No SE Sleep is OK.  Some awakening aftter 6 hours but not enough so takes another clonazepam.   Patient was administered Spravato 84 mg intranasally today.  The patient experienced the typical dissociation which gradually resolved over the 2-hour period of observation.  There were no complications.  Specifically the patient did not have nausea or vomiting or headache. No change in mood yet.   Sleep better with Belsomra 20 as long as takes with other meds.  If insomnia mood is worse. Ongoing some dep but gets partial relief with Spravato but would like better relief .  No sig benefit with pramipexole 1 mg QID.  11/21/22 TC:  To ER with dep 11/21/22. No SE with selegiline but feels more dep.  Wants to stop it. Today is only day 2 of 1 tablet of selegiline 5 mg AM.  Reviewed with her that she had not been satisfied with the response of Spravato and Zoloft and that she is likely feeling worse because the Zoloft is wearing off and in particular because she abruptly stopped 150 mg daily.  She needs to give the selegiline time to work.  She agrees to do so.  She admitted to some passive suicidal thoughts but no active suicidal thoughts and no desire to die.  She agrees to the plan to increase selegiline to 10 mg every morning this weekend and to continue the selegiline trial.  She cannot stop this and immediately start another AD due to risk  DDI with serotonin syndrome.  She is aware.  Meredith Staggers, MD, DFAPA  12/19/22 TC: Pt called at 1:42p.  She said she wanted to know if she could start weaning off the Selegiline.  She thinks increasing the dosage is making things worse.  She said she wants to go back to [employer].  She acknowleged she has an appt on Monday. I advised her that I wasn't sure Dr Jennelle Human will see her message before her appt, but she still ask me to send the message.  Next appt 8/26 MD resp.  No change until Monday appt.  12/22/22 appt noted: Meds: selegiline up to 5 mg 4 daily for a week but felt it incr irritability and went back to 3 mg daily. She wants to go back to Spravato.  Not near as dep as now.  Selegiline didn't help energy. Still on Belsomra Thinks maybe sertraline helped mood more than she thought. More dep than anxiety.  Plan: DC selegiline and will retry Effexor which helped in the past at 300 mg daily.  Start this Friday after over 10 half lives of selegiline.    12/24/22 appt noted: Stopped selegiline  Psych med: belsomra 20, clonazepam 1 mg HS.   She has felt more dep off the Spravato though at the time didn't think it was helping.  Has felt more down and withdrawn.  Low energy, motivation, not smiling.  No SI.  Sleep ok.   Patient was administered Spravato 84 mg intranasally today.  The patient experienced the typical dissociation which gradually resolved over the 2-hour period of observation.  There were no complications.  Specifically the patient did not have nausea or vomiting or headache. No problems with BP or other unusual SE Mood is a little better after this administration of Spravato.  Very motivated to continue it.  No immediate med concerns. No SI .  Ongoing anhedonia.  12/25/22 appt noted: Psych med: belsomra 20, clonazepam 1 mg HS.  Not resumed stimulant or AD yet   No SE. Patient was administered Spravato 84 mg intranasally today.  The patient experienced the typical dissociation  which gradually resolved over the 2-hour period of observation.  There were no complications.  Specifically the patient did not have nausea or vomiting or headache.  There is a lot things going on in her life and it is somewhat difficult to separate stressors from what is going on with her mood.  However she is happy to resume Spravato as she decided after stopping briefly that it was helpful.  She plans to start the antidepressant [place]row.  She also wants to resume the stimulant tomorrow.  12/30/22 appt noted; Psych med: belsomra 20, clonazepam 1 mg HS.  Not resumed stimulant.  Effexor XR 37.5 mg daily   No SE. Patient was administered Spravato 84 mg intranasally today.  The patient experienced the typical dissociation which gradually resolved over the 2-hour period of observation.  There were no complications.  Specifically the patient did not have nausea or vomiting or headache.  She is still having depression with very prominent anhedonia.  However she feels the Spravato is somewhat helpful and is hopeful for more complete response as she gets back onto a reasonable antidepressant dose.  She tolerated Effexor XR 300 mg daily in the past and took it for 5 months and it is probably the antidepressants worked best for her.  She is agreeable with this plan.  She is getting sleep benefit from the current medications Plan: incr Effexor 75 mg daily  01/02/23 appt noted: Psych meds as noted above except increase Effexor XR to 75 mg daily without side effects. Patient was administered Spravato 84 mg intranasally today.  The patient experienced the typical dissociation which gradually resolved over the 2-hour period of observation.  There were no complications.  Specifically the patient did not have nausea or vomiting or headache.  Agrees to increasing Effexor as planned.  SX not changed from that noted above.  No SI  01/05/23 appt noted: Psych meds as noted above except increase Effexor XR to 112.5 mg daily  without side effects. Patient was administered Spravato 84 mg intranasally today.  The patient experienced the typical dissociation which gradually resolved over the 2-hour period of observation.  There were no complications.  Specifically the patient did not have nausea or vomiting or headache.  Agrees to increasing Effexor as planned gradually to 300 mg daily.  SX not changed from that noted above.  Does however feel Spravato has dampened the degree of depression.  No SI Stressful life event discussed and won't be resolved until early next year.  01/07/23 appt noted: Psych meds Effexor XR 112.5 mg daily. Patient was administered Spravato 84 mg intranasally today.  The patient experienced the typical dissociation which gradually resolved over the 2-hour period of observation.  There were no complications.  Specifically the patient did not have nausea or vomiting or headache.  Agrees to increasing Effexor as planned gradually to 300 mg daily.  SX not changed from that noted above.  Does however feel Spravato has dampened the degree of depression markedly compared to when off psych meds and no Spravato.  No SI  01/12/23 appt noted; Psych meds Effexor XR 225 mg daily. No SE notedl. Patient was administered Spravato 84 mg intranasally today.  The patient experienced the typical dissociation which gradually resolved over the 2-hour period of observation.  There were no complications.  Specifically the patient did not have nausea or vomiting.  But is having headache.  Agrees to increasing Effexor as planned gradually to 300 mg daily.  SX not changed from that noted above.  Does however feel Spravato has dampened the degree of depression markedly compared to when off psych meds and no Spravato.  No SI  01/15/23 appt noted:  Psych meds Effexor XR 225 mg daily. No SE noted. Patient was administered Spravato 84 mg intranasally today.  The patient experienced the typical dissociation which gradually resolved  over the 2-hour period of observation.  There were no complications.  Specifically the patient did not have nausea or vomiting.   Doing ok with higher doses of Effexor and will plan to increase to 300 mg daily. Mild improvement noticed so far with the incrase in meds.   Uses Cotempla prn and it helps but doesn't liek the way she feels when it wears off.  01/22/23 appt noted: Psych meds reduced Effexor XR 150 mg daily. Bc of HA No SE noted. Patient was administered Spravato 84 mg intranasally today.  The patient experienced the typical dissociation which gradually resolved over the 2-hour period of observation.  There were no complications.  Specifically the patient did not have nausea or vomiting.  Ongoing moderate levels of depression with some improvement from the Spravato.  She is not sure about the effect of the venlafaxine on her mood yet.  She would like to continue to try pursuing a stimulant but has had difficulty tolerating p.o. versions of stimulants because of the crash at the end and side effects with many of them.  She has several questions about this.  01/27/23 appt noted: Psych meds: Clonazepam 1 to 1.5 mg nightly, lithium CR 300 nightly, Belsomra 20 nightly, venlafaxine XR 150 every morning No side effects noted at the current dosage. Patient was administered Spravato 84 mg intranasally today.  The patient experienced the typical dissociation which gradually resolved over the 2-hour period of observation.  There were no complications.  Specifically the patient did not have nausea or vomiting.  Ongoing moderate levels of depression with some improvement from the Spravato.   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  She does not tolerate oral stimulants very well because she has a crash as they wear off which is very dysphoric.  She is willing to retry the Daytrana patches that she took in the spring.  She took 1 patch on  1 occasion recently and  felt it was helpful. However she is also having problems with early morning awakening over the last week without precipitant.  She would like to have a different treatment strategy for sleep but understands her sleep problems have historically been difficult to resolve and maintain.  02/10/23 appt noted: Psych meds: Clonazepam 1 to 1.5 mg nightly, lithium CR 300 nightly, Belsomra 20 nightly, venlafaxine XR 150 every morning, Daytrana 30 mg Am No side effects noted at the current dosage. Patient was administered Spravato 84 mg intranasally today.  The patient experienced the typical dissociation which gradually resolved over the 2-hour period of observation.  There were no complications.  Specifically the patient did not have nausea or vomiting.  Ongoing moderate levels of depression with some improvement from the Spravato.  Particularly anhedonia.  No sig benefit noted with Daytrana and problems with table stimulants bc gets brief benefit with unpleasant "crash".  Wants to try another type of patch if available.  02/17/23 appt noted: Psych meds: Clonazepam 1 to 1.5 mg nightly, lithium CR 300 nightly, Belsomra 20 nightly, venlafaxine XR 225 mg every morning, Daytrana 30 mg Am No side effects noted at the current dosage. Patient was administered Spravato 84 mg intranasally today.  The patient experienced the typical dissociation which gradually resolved over the 2-hour period of observation.  There were no complications.  Specifically the patient did not have nausea or vomiting.  Ongoing moderate levels of depression with some improvement from the Spravato.  Particularly anhedonia.  She has not gotten the new stimulant yet but is hopeful. No problems with increasing venlafaxine.   Mood is better with Spravato and venlafaxine.  Less anhedonia, less down. Themazepam not helpful.    AIMS    Flowsheet Row Video Visit from 01/03/2022 in [facility]   AIMS Total Score 0      ECT-MADRS    Flowsheet Row Clinical Support from 08/13/2022 in [facility] Video Visit from 04/29/2022 in [facility] Video Visit from 02/06/2022 in [facility]  MADRS Total Score 27 44 23      GAD-7    Flowsheet Row Counselor from 12/19/2022 in [facility] Office Visit from 08/22/2022 in [facility] [place]  Total GAD-7 Score 4 7      PHQ2-9    Flowsheet Row Counselor from 12/19/2022 in [facility] Office Visit from 08/22/2022 in [facility] [place] Office Visit from 08/19/2022 in [facility] at [facility] Video Visit from 02/06/2022 in [facility] Office Visit from 11/16/2020 in [facility] for [facility] at [facility] Total Score 2 3 0 6 0  PHQ-9 Total Score 5 5 -- 14 --        Past Psychiatric Medication Trials: Trintellix - Initially effective and then was not effective when re-started Paxil- Ineffective. Helped initially. Prozac-Insomnia Lexapro- Effective and then no longer as effective Zoloft- Initially effective and then minimal response Viibryd Cymbalta- Ineffective. Helped initially. Effexor XR 300- Effective, well tolerated. Pristiq- Increased anxiety at 150 mg daily Wellbutrin XL-Ineffective.May have increased anxiety. Amitriptyline Nortriptyline-Caused irritability, constipation Auvelity- ineffective Selegilne brief 20 mg daily , more irritable  Abilify- Helpful but caused severe insomnia. Rexulti-Helpful but caused insomnia. Vraylar Seroquel - Ineffective Risperdal Olanzapine- Ineffective Latuda- Ineffective Caplyta  Klonopin- Effective Temazepam- Took during menopause Lunesta- Ineffective Ambien-Ineffective Sonata- Ineffective Dayvigo-  partially effective  Belsomra 20 worked and then failed  Trazodone-  somewhat effective Remeron-Ineffective. Does not recall wt gain.  Quetiapine 25 NR  Adderall- Took once and had adverse effect. Concerta- Effective but only 4-6 hours Jornay 80 NR Metadate-Not as effective as Concerta Dexmethylphenidate- Not as effective as Concerta Azstarys- some side effects. Not as effective.  Ritalin short acting and SE anxiety & HTN @20  TID Cotemplay 17.3  Daytrana NR  Lamictal- May have had an adverse effects. "Felt weird." Reports worsening s/s.  Lithium ? effect Gabapentin Pramipexole- 1 mg QID NR  ECT #20 with minimal response. H says it was partly helpful.  AIMS    Flowsheet Row Video Visit from 01/03/2022 in [facility]  AIMS Total Score 0      ECT-MADRS    Flowsheet Row Clinical Support from 08/13/2022 in [facility] Video Visit from 04/29/2022 in [facility] Video Visit from 02/06/2022 in [facility]  MADRS Total Score 27 44 23      GAD-7    Flowsheet Row Counselor from 12/19/2022 in [facility] Office Visit from 08/22/2022 in [facility] [place]  Total GAD-7 Score 4 7      PHQ2-9    Flowsheet Row Counselor from 12/19/2022 in [facility] Office Visit from 08/22/2022 in [facility] [place] Office Visit from 08/19/2022 in [facility] at [facility] Video Visit from 02/06/2022 in [facility] Office Visit from 11/16/2020 in [facility] at [facility] Total Score 2 3 0 6 0  PHQ-9 Total Score 5 5 -- 14 --        Review of Systems:  Review of Systems  Constitutional:  Positive for fatigue.  Cardiovascular:  Negative for chest pain and palpitations.   Neurological:  Positive for headaches. Negative for dizziness and tremors.  Psychiatric/Behavioral:  Positive for dysphoric mood and sleep disturbance. Negative for decreased concentration and suicidal ideas. The patient is nervous/anxious.     Medications: I have reviewed the patient's current medications.  Current Outpatient Medications  Medication Sig Dispense Refill   clonazePAM (KLONOPIN) 1 MG tablet Take 1-2 tablets (1-2 mg total) by mouth at bedtime. 60 tablet 0   Esketamine HCl, 84 MG Dose, (SPRAVATO, 84 MG DOSE,) 28 MG/DEVICE SOPK Place 84 mg into the nose every 7 (seven) days. 3 each 5   Estradiol (VAGIFEM) 10 MCG TABS vaginal tablet Place 1 tablet (10 mcg total) vaginally 2 (two) times a week. 24 tablet 3   lithium carbonate (LITHOBID) 300 MG ER tablet Take 1 tablet (300 mg total) by mouth at bedtime. 90 tablet 0   methylphenidate (DAYTRANA) 30 MG/9HR Place 1 patch onto the skin daily. wear patch for 9 hours only each day 30 patch 0   valACYclovir (VALTREX) 500 MG tablet Take one twice daily for 3 days with any symptoms 30 tablet 3   valsartan (DIOVAN) 80 MG tablet Take 1 tablet (80 mg total) by mouth daily. 90 tablet 3   venlafaxine XR (EFFEXOR-XR) 75 MG 24 hr capsule Take 3 capsules (225 mg total) by mouth daily. 270 capsule 0   Dextroamphetamine (XELSTRYM) 13.5 MG/9HR PTCH Place 1 patch onto the skin every morning. (Patient not taking: Reported on 02/17/2023) 30 patch 0   Suvorexant (BELSOMRA) 20 MG TABS Take 1 tablet (20  mg total) by mouth at bedtime. 30 tablet 0   No current facility-administered medications for this visit.    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   TRANSAMINASES, SERUM, ELEVATED 08/14/2009    Past Medical History, Surgical history, Social history, and Family history were reviewed and updated as appropriate.   Please see review of systems for further details on the patient's review from today.   Objective:   Physical Exam:  LMP 12/27/2007 (Exact Date)   Physical Exam Constitutional:      General: She is not in acute distress. Musculoskeletal:        General: No deformity.  Neurological:     Mental Status: She is alert and oriented to person, place, and time.     Coordination: Coordination normal.  Psychiatric:        Attention and Perception: Perception normal. She is inattentive. She does not perceive auditory hallucinations.        Mood and Affect: Mood is anxious and depressed. Affect is blunt.        Speech: Speech normal.        Behavior: Behavior normal.        Thought Content: Thought content normal. Thought content is not delusional. Thought content does not include homicidal or suicidal ideation. Thought content does not include suicidal plan.        Cognition and Memory: Cognition and memory normal.        Judgment: Judgment normal.     Comments: Insight intact Mood still mod dep but better with Spravato.  She is doing better overall. Affect blunted but with a little more expression today     Lab Review:     Component Value Date/Time   NA 132 (L) 10/27/2022 1107   K 4.4 10/27/2022 1107   CL 95 (L) 10/27/2022 1107   CO2 30 10/27/2022 1107   GLUCOSE 89 10/27/2022 1107   BUN 14 10/27/2022 1107   CREATININE 0.67 10/27/2022 1107   CREATININE 0.68 01/26/2020 0920   CALCIUM 10.1 10/27/2022 1107   PROT 8.3 04/22/2022 0932   ALBUMIN 5.3 (H) 04/22/2022 0932   AST 82 (H) 04/22/2022 0932   ALT 118 (H) 04/22/2022 0932   ALKPHOS 74 04/22/2022 0932   BILITOT 0.3 04/22/2022 0932   GFRNONAA >90 05/01/2014 1645   GFRAA >90 05/01/2014 1645       Component Value  Date/Time   WBC 3.3 (L) 04/22/2022 0932   RBC 4.25 04/22/2022 0932   HGB 14.3 04/22/2022 0932   HGB 14.1 10/26/2014 1034   HCT 40.7 04/22/2022 0932   PLT 245.0 04/22/2022 0932   MCV 95.7 04/22/2022 0932   MCH 34.4 (H) 01/26/2020 0920   MCHC 35.1 04/22/2022 0932   RDW 13.2 04/22/2022 0932   LYMPHSABS 0.9 04/22/2022 0932   MONOABS 0.2 04/22/2022 0932   EOSABS 0.1 04/22/2022 0932   BASOSABS 0.0 04/22/2022 0932    No results found for: "POCLITH", "LITHIUM"   No results found for: "PHENYTOIN", "PHENOBARB", "VALPROATE", "CBMZ"   .res Assessment: Plan:    Wilna was seen  today for follow-up, depression, anxiety, add and sleeping problem.  Diagnoses and all orders for this visit:  Recurrent major depression resistant to treatment [facility])  Attention deficit hyperactivity disorder (ADHD), predominantly inattentive type  Generalized anxiety disorder  Insomnia, unspecified type -     Suvorexant (BELSOMRA) 20 MG TABS; Take 1 tablet (20 mg total) by mouth at bedtime. -     clonazePAM (KLONOPIN) 1 MG tablet; Take 1-2 tablets (1-2 mg total) by mouth at bedtime.      Pt seen for TRD with multiple failed medications as noted above.    Mood is better with venlafaxine XR 225 with Spravato  Patient was administered Spravato 84 mg intranasally today.  The patient experienced the typical dissociation which gradually resolved over the 2-hour period of observation.  There were no complications.  Specifically the patient did not have nausea or vomiting or headache.  Blood pressures remained within normal ranges at the 40-minute and 2-hour follow-up intervals.  By the time the 2-hour observation period was met the patient was alert and oriented and able to exit without assistance.  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  See nursing note for further details.  Consider switch Spravato to MAOI,  Parnate  We discussed the short-term risks associated with benzodiazepines including sedation and increased fall risk among others.  Discussed long-term side effect risk including dependence, potential withdrawal symptoms, and the potential eventual dose-related risk of dementia.  But recent studies from 2020 dispute this association between benzodiazepines and dementia risk. Newer studies in 2020 do not support an association with dementia.  for insomnia, wants to continue clonazepam 1.5 -2 mg nightly.  She wants to continue Belsomra partially effective.  Cotempla or Concerta bc helps mood early in day and focus  benefit lasts longer.  Have not been able to get stimulant to be consitently effective throughout the day. Conisder switching to patch bc pt has significant crash with stimulants or perhaps [place].  Stimulant tablets cause crash with inadequate duration .  Patches should provide smoother response. Reports no response to Daytrana for mood and limited  Will try another patch, Xelstrym   Discussed potential benefits, risks, and side effects of stimulants with patient to include increased heart rate, palpitations, insomnia, increased anxiety, increased irritability, or decreased appetite.  Instructed patient to contact office if experiencing any significant tolerability issues.  Continue venlafaxine XR to 225 mg daily.    Disc pretreating Spravato with NSAID or Tylenol bc of HA lately.  Supportive therapy dealing with serious stressful life event.  FU twice weekly Spravato until next week then try to drop back to weekly .  Meredith Staggers, MD, DFAPA    Please see After Visit Summary for patient specific instructions.  Future Appointments  Date Time Provider Department Center  02/23/2023  1:00 PM Gaspar Bidding [facility] CP-CP None  02/25/2023  1:30 PM Cottle, Steva Ready., MD CP-CP None  02/25/2023  1:30 PM CP-NURSE CP-CP None  03/02/2023  2:00 PM Gaspar Bidding, [facility] CP-CP  None  03/11/2023  9:00 AM Gaspar Bidding, [facility] CP-CP None  03/18/2023 11:00 AM Gaspar Bidding, [facility] CP-CP None  03/24/2023  9:00 AM Gaspar Bidding, [facility] CP-CP None  04/02/2023  9:00 AM Gaspar Bidding, [facility] CP-CP None  09/18/2023  8:15 AM Jerene Bears, MD [facility]    No orders of the defined types were placed in this  encounter.   -------------------------------

## 2023-02-17 NOTE — Progress Notes (Signed)
NURSES NOTE:           Patient arrived for her #56 Spravato treatment. Pt is coming once a week now. Pt is being treated for Treatment Resistant Depression, pt will be receiving 84 mg (3 of the 28 mg) nasal sprays, this is her maintenance dose. Patient taken to treatment room, I explained and discussed her treatment and how the schedule should go, along with any side effects that may occur. Answered any questions and concerns the patient had. Pt's Spravato is delivered through [place] and she is now billing with her insurance. Spravato medication is stored at doctors office per REMS/FDA guidelines. The medication is required to be locked behind two doors per FDA/REMS Protocol. Medication is also disposed of properly per regulations. All treatments and her vital signs are documented in Spravato REMS per protocol of treatment center regulations. [facility] delivers her medication.      Vital Signs assessed at 12:05 PM 137/78, pulse 65, pulse ox 92%. Instructed pt to blow her nose and recline back slightly to prevent any of medication dripping out of her nose.   Pt. Given 1st dose (28 mg inhaler), then 5 minutes between 2nd dose and 3rd dose for total of 84 mg. No complaints of nausea/vomiting reported. Pt did listen to music today, she just reclined back and closed her eyes. After 40 minutes I went to assess her vital signs, no side effects or symptoms reported, B/P 138/83, pulse 73. Dr. Jennelle Human talked to pt today about her treatment. Nurse was with pt a total of 60 minutes but pt observed for 120 minutes per protocol. Discharge vital signs were at 2:00 PM, B/P 130/85, pulse 78. She verbalized understanding. She reports dissociation but she was clear upon her discharge. She is scheduled next week on Wednesday, October 30th. She is instructed to contact office if needing anything prior to her next treatment.     LOT # [id number]   EXP FEB 2027

## 2023-02-19 ENCOUNTER — Encounter: Payer: 59 | Admitting: Psychiatry

## 2023-02-19 ENCOUNTER — Telehealth: Payer: Self-pay

## 2023-02-19 NOTE — Telephone Encounter (Signed)
Prior Authorization submitted for Xelstrym 13.5 mg/9 hr patch with [employer], based on their criteria they were unable to approve the medication. They did receive all her previously tried stimulant medications as well as Dr. Alwyn Ren office notes.  Per [employer]: Coverage is provided in situations where the patient has tried and according to the prescriber has experienced an adequate efficacy or significant intolerance with at least 3 preferred formulary alternatives: Dextroamphetamine extended release capsules, amphetamine mixed extended release capsules, and lisdexamfetamine capsules or chewable tablets.  Will discuss with Dr. Jennelle Human on how to proceed.

## 2023-02-20 ENCOUNTER — Other Ambulatory Visit: Payer: Self-pay | Admitting: Psychiatry

## 2023-02-20 DIAGNOSIS — F900 Attention-deficit hyperactivity disorder, predominantly inattentive type: Secondary | ICD-10-CM

## 2023-02-23 ENCOUNTER — Encounter: Payer: Self-pay | Admitting: Psychiatry

## 2023-02-23 ENCOUNTER — Ambulatory Visit: Payer: 59

## 2023-02-23 ENCOUNTER — Ambulatory Visit (INDEPENDENT_AMBULATORY_CARE_PROVIDER_SITE_OTHER): Payer: 59 | Admitting: Psychiatry

## 2023-02-23 ENCOUNTER — Ambulatory Visit: Payer: 59 | Admitting: Professional Counselor

## 2023-02-23 VITALS — BP 149/86 | HR 65

## 2023-02-23 DIAGNOSIS — F339 Major depressive disorder, recurrent, unspecified: Secondary | ICD-10-CM

## 2023-02-23 DIAGNOSIS — F900 Attention-deficit hyperactivity disorder, predominantly inattentive type: Secondary | ICD-10-CM

## 2023-02-23 DIAGNOSIS — F411 Generalized anxiety disorder: Secondary | ICD-10-CM | POA: Diagnosis not present

## 2023-02-23 DIAGNOSIS — G4700 Insomnia, unspecified: Secondary | ICD-10-CM

## 2023-02-23 MED ORDER — VENLAFAXINE HCL ER 150 MG PO CP24
150.0000 mg | ORAL_CAPSULE | Freq: Two times a day (BID) | ORAL | 0 refills | Status: DC
Start: 2023-02-23 — End: 2023-04-28

## 2023-02-23 MED ORDER — LISDEXAMFETAMINE DIMESYLATE 40 MG PO CAPS
40.0000 mg | ORAL_CAPSULE | ORAL | 0 refills | Status: DC
Start: 2023-02-23 — End: 2023-03-08

## 2023-02-23 MED ORDER — LITHIUM CARBONATE ER 300 MG PO TBCR
600.0000 mg | EXTENDED_RELEASE_TABLET | Freq: Every evening | ORAL | Status: DC
Start: 2023-02-23 — End: 2023-02-24

## 2023-02-23 NOTE — Telephone Encounter (Signed)
Appt today

## 2023-02-23 NOTE — Progress Notes (Signed)
NURSES NOTE:           Patient arrived for her #57 Spravato treatment. Pt is coming once a week now. Pt is being treated for Treatment Resistant Depression, pt will be receiving 84 mg (3 of the 28 mg) nasal sprays, this is her maintenance dose. Patient taken to treatment room, I explained and discussed her treatment and how the schedule should go, along with any side effects that may occur. Answered any questions and concerns the patient had. Pt's Spravato is delivered through [place] and she is now billing with her insurance. Spravato medication is stored at doctors office per REMS/FDA guidelines. The medication is required to be locked behind two doors per FDA/REMS Protocol. Medication is also disposed of properly per regulations. All treatments and her vital signs are documented in Spravato REMS per protocol of treatment center regulations. [facility] delivers her medication.      Vital Signs assessed at 8:00 AM 125/74, pulse 64, pulse ox 92%. Instructed pt to blow her nose and recline back slightly to prevent any of medication dripping out of her nose.   Pt. Given 1st dose (28 mg inhaler), then 5 minutes between 2nd dose and 3rd dose for total of 84 mg. No complaints of nausea/vomiting reported. Pt did listen to music today, she just reclined back and closed her eyes. After 40 minutes I went to assess her vital signs, no side effects or symptoms reported, B/P 137/80, pulse 64. Dr. Jennelle Human talked to pt today about her treatment. Nurse was with pt a total of 60 minutes but pt observed for 120 minutes per protocol. Discharge vital signs were at 10:07 AM, B/P 149/86, pulse 65. She verbalized understanding. She reports dissociation but she was clear upon her discharge. She is scheduled next week on Wednesday, November 6th. She is instructed to contact office if needing anything prior to her next treatment.     LOT # [id number]   EXP FEB 2027

## 2023-02-23 NOTE — Progress Notes (Signed)
Heather Davila [id number] 1960-12-02 62 y.o.  Subjective:   Patient ID:  Heather Davila is a 62 y.o. (DOB [birth year]) female.  Chief Complaint:  Chief Complaint  Patient presents with   Follow-up   Depression   Anxiety   ADD   Fatigue   Sleeping Problem    HPI [name] presents to the office today for follow-up of treatment resistant recurrent depression, generalized anxiety disorder and insomnia.  Almost too numerous to count med failures. She is here for Spravato administration for her treatment resistant depression.  05/21/22 appt noted: Patient was administered first dose Spravato 56 mg intranasally today.  The patient experienced the typical dissociation which gradually resolved over the 2-hour period of observation.  There were no complications.  Specifically the patient did not have nausea or vomiting or headache.   Dissociation was mild. Did not experience any emotional relief from disabling depression.wants to increase Spravato to the usual dose.  She is aware insurance is not covering the costs at this time.  No SI acutely. Tolerating meds.    05/23/22 appt noted: Patient was administered Spravato 84 mg intranasally today.  The patient experienced the typical dissociation which gradually resolved over the 2-hour period of observation.  There were no complications.  Specifically the patient did not have nausea or vomiting.   Dissociation was greater than last dose and a little bothersome DT some HA. Tolerating meds.   Mood has not changed significantly thus far with Spravato.  05/27/22 appt noted: Patient was administered Spravato 84 mg intranasally today.  The patient experienced the typical dissociation which gradually resolved over the 2-hour period of observation.  There were no complications.  Specifically the patient did not have nausea or vomiting or headache. Today she has felt a little relief from the pressing heaviness of depression but a little more anxiety.   During administration of Spravato she listens to [place] music and was able to relax a little more with the feeling of dissociation that was mildly bothersome. Husband was also in on the session today and asked some questions about IV ketamine versus nasal spray ketamine.  05/29/22 appt noted: Cancelled today DT hypertension  05/30/22 appt noted: Patient was administered Spravato 84 mg intranasally today.  The patient experienced the typical dissociation which gradually resolved over the 2-hour period of observation.  There were no complications.  Specifically the patient did not have nausea or vomiting or headache. She is seeing some improvement in mood with less continuous depression and less intensity.  Hopefulness is better.   No med complaints or SE  06/05/22 appt noted: Patient was administered Spravato 84 mg intranasally today.  The patient experienced the typical dissociation which gradually resolved over the 2-hour period of observation.  There were no complications.  Specifically the patient did not have nausea or vomiting or headache.  Specifically HA is less of a problem with Spravato. No SE with meds. Depression is improving with less intensity.  Intermittent anxiety easily.  More able to enjoy things.  No new concerns.  06/17/22 appt noted: Patient was administered Spravato 84 mg intranasally today.  The patient experienced the typical dissociation which gradually resolved over the 2-hour period of observation.  There were no complications.  Specifically the patient did not have nausea or vomiting or headache.  Specifically HA is less of a problem with Spravato. No SE with meds. Depression is improved 45% with less intensity.  Intermittent anxiety less easily.  More able to enjoy things.  No new  concerns.  More active. Current meds: increased clonazepam to about 1.5 mg HS DT recent insomnia, lexapro 20, Dayvigo 10 mg HS, concerta 36 mg AM, trazodone 100 mg HS.  06/20/22 appt  noted: Current meds: increased clonazepam to about 1.5 mg HS DT recent insomnia, lexapro 20, Dayvigo 10 mg HS, concerta 36 mg AM, trazodone 100 mg HS. Patient was administered Spravato 84 mg intranasally today.  The patient experienced the typical dissociation which gradually resolved over the 2-hour period of observation.  There were no complications.  Specifically the patient did not have nausea or vomiting or headache.  Specifically HA is less of a problem with Spravato. No SE with meds. Depression is improved 45% with less intensity.  Intermittent anxiety less easily.  More able to enjoy things.  No new concerns.  More active.  06/23/22 appt noted:  Current meds: increased clonazepam to about 1.5 mg HS DT recent insomnia, lexapro 20, Dayvigo 10 mg HS, concerta 36 mg AM, trazodone 100 mg HS. Patient was administered Spravato 84 mg intranasally today.  The patient experienced the typical dissociation which gradually resolved over the 2-hour period of observation.  There were no complications.  Specifically the patient did not have nausea or vomiting or headache.  Specifically HA is less of a problem with Spravato. No SE with meds. Intensity of Spravato dissociation varies.  Last week very intesne and this week more typical.  Depression is continuing to improve with better interest and activity and motivation.  Gradual improvement.  Wants to continue.  06/25/22 appt noted: Current meds: increased clonazepam to about 1.5 mg HS DT recent insomnia, lexapro 20, Dayvigo 10 mg HS, concerta 36 mg AM, trazodone 100 mg HS. Patient was administered Spravato 84 mg intranasally today.  The patient experienced the typical dissociation which gradually resolved over the 2-hour period of observation.  There were no complications.  Specifically the patient did not have nausea or vomiting or headache.  Specifically HA is less of a problem with Spravato. No SE with meds. Intensity of Spravato dissociation varies.  No  scary and well tolerated. Continues to gradually improve with Spravato.  She is more motivated and enjoying things more.  H sees progress.  Wants to continue twice weekly until gets maximum response.  Dep is not gone yet.  06/30/22 appt noted:  seen with H Current meds: increased clonazepam to about 1.5 mg HS DT recent insomnia, lexapro 20, Dayvigo 10 mg HS, concerta 36 mg AM, trazodone 100 mg HS. Patient was administered Spravato 84 mg intranasally today.  The patient experienced the typical dissociation which gradually resolved over the 2-hour period of observation.  There were no complications.  Specifically the patient did not have nausea or vomiting or headache.  Specifically HA is less of a problem with Spravato. No SE with meds. Intensity of Spravato dissociation varies.  No scary and well tolerated. Continues to gradually improve with Spravato.  She is more motivated and enjoying things more.  H sees even more progress than she does; more active and smiling.  Wants to continue twice weekly until gets maximum response.  Dep is 80% better.  07/07/22 appt noted: Current meds: increased clonazepam to about 1.5 mg HS DT recent insomnia, lexapro 20, Dayvigo 10 mg HS, concerta 36 mg AM, trazodone 100 mg HS. Patient was administered Spravato 84 mg intranasally today.  The patient experienced the typical dissociation which gradually resolved over the 2-hour period of observation.  There were no complications.  Specifically the patient  did not have nausea or vomiting or headache.  Specifically HA is less of a problem with Spravato. No SE with meds. Intensity of Spravato dissociation varies.  No scary and well tolerated.  Was more intese today. Overall mood is markedly better and please with meds. No changes desired.  07/09/22 appt noted: In transition from Lexapro to sertraline and missing Concerta bc CO crash from it after about 4 hours.  Disc these concerns.  She'll discuss also with Corie Chiquito,  NP More dep the last couple of days and concerned about reducing Spravato frequency while transition from one antidep to another.  Affect less positive. Patient was administered Spravato 84 mg intranasally today.  The patient experienced the typical dissociation which gradually resolved over the 2-hour period of observation.  There were no complications.  Specifically the patient did not have nausea or vomiting or headache.  Specifically HA is less of a problem with Spravato. No SE with meds. Intensity of Spravato dissociation varies.  No scary and well tolerated.   07/14/22 appt noted: Has been more depressed this week.  More trouble with sleep.  Taking clonazepam 1-1.5  of the 0.5 mg tablets.   Trazodone not helping much. She switched back from 200 sertraline to Lexapro 20.  No SE More trouble with motivation.  Some stress with son with special needs. Misses the benevit of Concerta but it was too short acting and crashed in 4-6 hours. Patient was administered Spravato 84 mg intranasally today.  The patient experienced the typical dissociation which gradually resolved over the 2-hour period of observation.  There were no complications.  Specifically the patient did not have nausea or vomiting or headache.  Specifically HA is less of a problem with Spravato. No SE with meds. Intensity of Spravato dissociation varies.  No scary and well tolerated.   07/17/22 appt noted: Patient was administered Spravato 84 mg intranasally today.  The patient experienced the typical dissociation which gradually resolved over the 2-hour period of observation.  There were no complications.  Specifically the patient did not have nausea or vomiting or headache.  Specifically HA is less of a problem with Spravato. No SE with meds. Intensity of Spravato dissociation varies.  Not scary and well tolerated.  Just got Jornay last night but wasn't sure how to take it.  Wants to start and this was discussed.  She felt MPH helped  mood and attn but didn't last long enough and had crash after a few hours on Concerta.  Better for 2 hours after each dose Ritalin 20 mg with mood but then it wore off.  BP up after 3rd dose 160/90 and then took H's amlodipine 5 and BP became normal.  07/22/22 appt noted: Patient was administered Spravato 84 mg intranasally today.  The patient experienced the typical dissociation which gradually resolved over the 2-hour period of observation.  There were no complications.  Specifically the patient did not have nausea or vomiting or headache.  Specifically HA is less of a problem with Spravato. No SE with meds. Intensity of Spravato dissociation varies.  Not scary and well tolerated.  Start Jornay 20 mg over the weekend and did not notice any particular effect good or bad.  Increased to 40 mg. Other psych meds: Clonazepam 0.5 mg tablets 2 nightly, gabapentin dosage varies, Dayvigo 10 mg nightly, Lexapro 20 mg daily, to trazodone 100 mg nightly  07/24/22 appt noted: Patient was administered Spravato 84 mg intranasally today.  The patient experienced the typical dissociation  which gradually resolved over the 2-hour period of observation.  There were no complications.  Specifically the patient did not have nausea or vomiting or headache.  Specifically HA is less of a problem with Spravato. No SE with meds. Intensity of Spravato dissociation varies.  Not scary and well tolerated.  Other psych meds: Clonazepam 0.5 mg tablets 2 nightly, gabapentin dosage varies, Dayvigo 10 mg nightly, Lexapro 20 mg daily, to trazodone 100 mg nightly, Jornay 40 mg PM Doing well with Spravato.  Noticed a little effect from the Jornay 40 without SE but would like to increase the dose for ADD and mood.  Sleeping well.  No new concerns.  Still more depressed than she was with th initial benefit of Spravato.  07/30/22 note:  Patient was administered Spravato 84 mg intranasally today.  The patient experienced the typical  dissociation which gradually resolved over the 2-hour period of observation.  There were no complications.  Specifically the patient did not have nausea or vomiting or headache.  Specifically HA is less of a problem with Spravato. Other psych meds: Clonazepam 0.5 mg tablets 2 nightly, gabapentin dosage varies, Dayvigo 10 mg nightly, Lexapro 20 mg daily, to trazodone 100 mg nightly, Jornay 60 mg PM Wants to increase Jornay to 80 mg PM to increase benefit for ADD and depression. No SE She has experienced a major family crisis that threatens to change her daily life from now on.  It has not triggered suicidal thoughts at this time but she is very afraid.  No desire to change medications.  08/04/22 appt noted" Patient was administered Spravato 84 mg intranasally today.  The patient experienced the typical dissociation which gradually resolved over the 2-hour period of observation.  There were no complications.  Specifically the patient did not have nausea or vomiting or headache.  Specifically HA is less of a problem with Spravato. Other psych meds: Clonazepam 0.5 mg tablets 2 nightly, gabapentin dosage varies, Dayvigo 10 mg nightly, Lexapro 20 mg daily, trazodone 100 mg nightly, Jornay 80 mg PM No SE No benefit or SE with increase Jormay 80 mg.  Says Concerta helped ADD and depression but not Jornay.  However duration was insufficient.  Still depressed and wants change to another form of stimulant.  No concerns with other meds. Continues with strong family stressors creating uncertainty about her future but no quick resolution. No SI Trial Cotempla 17.3 and DC Ritalin & Jornay 80  08/11/2022 appointment noted: Patient was administered Spravato 84 mg intranasally today.  The patient experienced the typical dissociation which gradually resolved over the 2-hour period of observation.  There were no complications.  Specifically the patient did not have nausea or vomiting or headache.  Specifically HA is less  of a problem with Spravato now vs when started it.. Other psych meds: Clonazepam 0.5 mg tablets 2 nightly, gabapentin dosage varies, Dayvigo 10 mg nightly, trazodone 100 mg nightly, cotempla 17.3, switch from Lexapro 20 to sertaline 15o mg . No noticeable effects sertraline from for depression but has seen some antianxiety effects from the sertraline.  No side effects noted.  No side effects with the other medicines.  Still is only getting very brief benefit 3 to 4 hours from Cotempla for ADD and mood.  She wants to try the Daytrana patch as we have discussed before. No SE  08/13/22 appt: Patient was administered Spravato 84 mg intranasally today.  The patient experienced the typical dissociation which gradually resolved over the 2-hour period of observation.  There were no complications.  Specifically the patient did not have nausea or vomiting or headache.  Specifically HA is less of a problem with Spravato now vs when started it.. Other psych meds: Clonazepam 0.5 mg tablets 2 nightly, gabapentin dosage varies, Dayvigo 10 mg nightly, trazodone 100 mg nightly, cotempla 17.3, switch from Lexapro 20 to sertaline 15o mg . No noticeable effects sertraline from for depression but has seen some antianxiety effects from the sertraline.  No side effects noted.  No side effects with the other medicines.  Still is only getting very brief benefit 3 to 4 hours from Cotempla for ADD and mood.  She wants to try the Daytrana patch as we have discussed before.  Hasn't been able to get it yet. No SE Plan.  Continue to try to get Daytrana 30 AM  08/18/22 appt noted: Patient was administered Spravato 84 mg intranasally today.  The patient experienced the typical dissociation which gradually resolved over the 2-hour period of observation.  There were no complications.  Specifically the patient did not have nausea or vomiting or headache.  Specifically HA is less of a problem with Spravato now vs when started it.. Other  psych meds: Clonazepam 0.5 mg tablets 2 nightly, gabapentin dosage varies, Dayvigo 10 mg nightly, trazodone 100 mg nightly, cotempla 17.3, switch from Lexapro 20 to sertaline 15o mg . No noticeable effects sertraline from for depression but has seen some antianxiety effects from the sertraline.  No side effects noted.  No side effects with the other medicines.  Still is only getting very brief benefit 3 to 4 hours from Cotempla for ADD and mood.  She wants to try the Daytrana patch as we have discussed before.  Hasn't been able to get it yet.  Overall depression is some better than it was.   No SE Plan.  Continue to try to get Daytrana 30 AM  08/25/22 appt : No benefit Daytrana.  No SE.  Wants further med changes and interested In retrying pramipexole.   Tolerating meds. Patient was administered Spravato 84 mg intranasally today.  The patient experienced the typical dissociation which gradually resolved over the 2-hour period of observation.  There were no complications.  Specifically the patient did not have nausea or vomiting or headache.  Specifically HA is less of a problem with Spravato now vs when started it.. Other psych meds: Clonazepam 0.5 mg tablets 2 nightly, gabapentin dosage varies, Dayvigo 10 mg nightly, trazodone 100 mg nightly, , switch from Lexapro 20 to sertaline 15o mg . Daytrana 30 AM for a few days. Struggles with depression and no SI.  Reduced interest and mood and motivation and enjoyment and socialization.  08/27/22 appt noted: Psych meds:  Clonazepam 0.5 mg tablets 2 nightly, gabapentin dosage varies, Dayvigo 10 mg nightly, trazodone 100 mg nightly,  sertaline 15o mg . Returned to [employer] AM, pramipexole  Tolerating meds. Patient was administered Spravato 84 mg intranasally today.  The patient experienced the typical dissociation which gradually resolved over the 2-hour period of observation.  There were no complications.  Specifically the patient did not have nausea or vomiting  or headache.   Reports taking cotempla bc brief mood benefit and sig focus and productivity benefit. Took 1 dose pramipexole and felt more dep.  09/01/22 appt noted: Psych meds:  Clonazepam 0.5 mg tablets 2 nightly, gabapentin dosage varies, Dayvigo 10 mg nightly, trazodone 100 mg nightly,  sertaline 15o mg . Returned to Cotempla AM, pramipexole  0.25 mg BID. Tolerating  meds now. Patient was administered Spravato 84 mg intranasally today.  The patient experienced the typical dissociation which gradually resolved over the 2-hour period of observation.  There were no complications.  Specifically the patient did not have nausea or vomiting or headache.   Willing to try the pramipexole and will continue 0.25 mg BID this week and then consider increase if tolerated.  Mood is a little better.  Still looking for further improvement.  09/03/22 appt noted: Psych meds:  Clonazepam 0.5 mg tablets 2 nightly, gabapentin dosage varies, Dayvigo 10 mg nightly, trazodone 100 mg nightly,  sertaline 15o mg . Returned to Cotempla AM, pramipexole  0.25 mg tablet 1 and 1/2 tablets twice daily BID. Tolerating meds now. Patient was administered Spravato 84 mg intranasally today.  The patient experienced the typical dissociation which gradually resolved over the 2-hour period of observation.  There were no complications.  Specifically the patient did not have nausea or vomiting or headache.   Depression may be a little better with the parmipexole and is tolerating it well so far.  Still struggling with focus and residual depression.  Tolerating meds without SE.  More hopeful and able to enjoy some things.  09/08/22 appt  Psych meds:  Clonazepam 0.5 mg tablets 2 nightly, gabapentin dosage varies, Dayvigo 10 mg nightly, trazodone 100 mg nightly,  sertaline 15o mg . Returned to Cotempla AM, pramipexole  0.5 mg BID. Tolerating meds now. Patient was administered Spravato 84 mg intranasally today.  The patient experienced the  typical dissociation which gradually resolved over the 2-hour period of observation.  There were no complications.  Specifically the patient did not have nausea or vomiting or headache.   She has seen her depression drop from a 10/10 prior to treatment with Spravato to now 4/10 With additional benefit noted when she increased the pramipexole to 0.5 mg twice daily.  She is not having side effects with this like she did in the past. Anxiety levels are also improved. She is sleeping okay with the current medications. Plan: continue pramipexole trial off-label and increase more slowly for TRD.  Increase  Gradually  to 0.75 mg BID  09/11/22 appt noted: Psych meds:  Clonazepam 0.5 mg tablets 2 nightly, gabapentin dosage varies, Dayvigo 10 mg nightly, trazodone 100 mg nightly,  sertaline 15o mg . Returned to Cotempla AM, pramipexole  0.75 mg BID. Tolerating meds now. Patient was administered Spravato 84 mg intranasally today.  The patient experienced the typical dissociation which gradually resolved over the 2-hour period of observation.  There were no complications.  Specifically the patient did not have nausea or vomiting or headache.   She has seen her depression drop from a 10/10 prior to treatment with Spravato to now 4/10 With additional benefit noted when she increased the pramipexole to 0.5 mg twice daily.  She is not having side effects with this like she did in the past. Clearly improving with the pramipexole now and tolerating it.  Satisfied with current meds but would consider increasing if it might be helpful.  09/15/22 appt noted: Psych meds:  Clonazepam 0.5 mg tablets 2 nightly, gabapentin dosage varies, Dayvigo 10 mg nightly, trazodone 100 mg nightly,  sertaline 15o mg . Returned to Cotempla AM, pramipexole  0.75 mg BID too anxious and reduced to 0.5 mg BID. Tolerating meds now. Patient was administered Spravato 84 mg intranasally today.  The patient experienced the typical dissociation  which gradually resolved over the 2-hour period of observation.  There were no  complications.  Specifically the patient did not have nausea or vomiting or headache.   Trouble staying asleep longer than 6 hours.  Doesn't feel she can tolerate pramipexole 0.75 mg BID DT anxiety not experienced at  0.5 mg BID.  Otherwise tolerating meds.  Mood is better with Spravato and pramipexole.   Not sleeping as long with meds.  EMA 6 hours.  Wonders about change Disc concerns about waiting for therapy. Feels ready to reduce Spravato to weekly.    09/25/22 appt noted: Psych meds:  Clonazepam 0.5 mg tablets 2 nightly, gabapentin dosage varies, Dayvigo 10 mg nightly, trazodone 100 mg nightly,  sertaline 15o mg . Returned to Cotempla AM, pramipexole  0.75 mg BID too anxious and reduced to 0.5 mg BID. Tolerating meds now. Patient was administered Spravato 84 mg intranasally today.  The patient experienced the typical dissociation which gradually resolved over the 2-hour period of observation.  There were no complications.  Specifically the patient did not have nausea or vomiting or headache.   Trouble staying asleep longer than 6 hours.  Failed to respond to Seroquel 25 mg HS.  Gone back to [facility].  Belsomra NR. Mood ok until the last few days more dep bc it's been 10 days since last admin.  Wants to continue weekly.   No SE.  No med changes desired.  09/29/22 appt noted: Psych meds:  Clonazepam 0.5 mg tablets 2 nightly, gabapentin dosage varies, Dayvigo 10 mg nightly, trazodone 100 mg nightly,  sertaline 15o mg . Returned to Cotempla AM, pramipexole  0.75 mg BID too anxious and reduced to 0.5 mg BID. Tolerating meds now. Patient was administered Spravato 84 mg intranasally today.  The patient experienced the typical dissociation which gradually resolved over the 2-hour period of observation.  There were no complications.  Specifically the patient did not have nausea or vomiting or headache.   Trouble staying asleep  longer than 6 hours.  Plan: Continue sertaline to 200 mg daily.  It has helped anxiety but not depression.   for insomnia, clonazepam 1 mg HS. trazodone doesn't work well but helps some Continue Dayvigo 10 HS.  Failed resp Belsomra 15 and seroquel 25 She wants to continue Cotempla or Concerta bc helps mood early in day and focus  benefit lasts longer.  Have not been able to get stimulant to be consitently effective throughout the day. continue pramipexole trial off-label and reduce back to  TRD 0.5 mg BID Spravato weekly.  10/13/22 appt noted: Not sleeping as well.  Wants to increase Spravato to twice weekly bc feeling more dep close to the next sched date.  Just the day or 2 before.  .  Plan: Trial for off label TRD increase pramipexole 1 mg TID  10/20/22 appt noted: Psych meds:  Clonazepam 0.5 mg tablets 2 nightly, gabapentin dosage varies, Dayvigo 10 mg nightly, trazodone 100 mg nightly,  sertaline 15o mg . Returned to Cotempla AM, pramipexole  1 mg TID. Tolerating meds now. Patient was administered Spravato 84 mg intranasally today.  The patient experienced the typical dissociation which gradually resolved over the 2-hour period of observation.  There were no complications.  Specifically the patient did not have nausea or vomiting or headache.   Trouble staying asleep longer than 6 hours.  More dep this week and asked to get Spravato twice this week. Plan: Trial for off label TRD increase pramipexole 1 mg QID  10/22/22 appt noted:  Psych meds:  Clonazepam 0.5 mg tablets 2 nightly and  another one with EMA, gabapentin dosage varies, Belsomra 20 mg daily,  trazodone 100 mg nightly,  sertaline 15o mg . Returned to Cotempla AM, pramipexole  1 mg QID for 2nd day Tolerating meds now.  Except a little N and dizzy briefly after takes it. Patient was administered Spravato 84 mg intranasally today.  The patient experienced the typical dissociation which gradually resolved over the 2-hour period of  observation.  There were no complications.  Specifically the patient did not have nausea or vomiting or headache. No change in mood yet.   Sleep better with Belsomra 20 as long as takes with other meds.  If insomnia mood is worse. Ongoing some dep but gets partial relief with Spravato but would like better relief .  10/27/22 received Spravato 84  11/03/22 appt noted:  Meds as above except sertraline she cut to 100 mg instead of 200 mg daily She doesn't think sertraline helping and worries over poss SE Off and on Cotempla .  Not affecting BP. Doesn't notice benefit or SE with pramipexole 1 mg QID.  No SE Sleep is OK.  Some awakening aftter 6 hours but not enough so takes another clonazepam.   Patient was administered Spravato 84 mg intranasally today.  The patient experienced the typical dissociation which gradually resolved over the 2-hour period of observation.  There were no complications.  Specifically the patient did not have nausea or vomiting or headache. No change in mood yet.   Sleep better with Belsomra 20 as long as takes with other meds.  If insomnia mood is worse. Ongoing some dep but gets partial relief with Spravato but would like better relief .  No sig benefit with pramipexole 1 mg QID.  11/21/22 TC:  To ER with dep 11/21/22. No SE with selegiline but feels more dep.  Wants to stop it. Today is only day 2 of 1 tablet of selegiline 5 mg AM.  Reviewed with her that she had not been satisfied with the response of Spravato and Zoloft and that she is likely feeling worse because the Zoloft is wearing off and in particular because she abruptly stopped 150 mg daily.  She needs to give the selegiline time to work.  She agrees to do so.  She admitted to some passive suicidal thoughts but no active suicidal thoughts and no desire to die.  She agrees to the plan to increase selegiline to 10 mg every morning this weekend and to continue the selegiline trial.  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  She is aware.  Meredith Staggers, MD, DFAPA  12/19/22 TC: Pt called at 1:42p.  She said she wanted to know if she could start weaning off the Selegiline.  She thinks increasing the dosage is making things worse.  She said she wants to go back to [employer].  She acknowleged she has an appt on Monday. I advised her that I wasn't sure Dr Jennelle Human will see her message before her appt, but she still ask me to send the message.  Next appt 8/26 MD resp.  No change until Monday appt.  12/22/22 appt noted: Meds: selegiline up to 5 mg 4 daily for a week but felt it incr irritability and went back to 3 mg daily. She wants to go back to Spravato.  Not near as dep as now.  Selegiline didn't help energy. Still on Belsomra Thinks maybe sertraline helped mood more than she thought. More dep  than anxiety.   Plan: DC selegiline and will retry Effexor which helped in the past at 300 mg daily.  Start this Friday after over 10 half lives of selegiline.    12/24/22 appt noted: Stopped selegiline  Psych med: belsomra 20, clonazepam 1 mg HS.   She has felt more dep off the Spravato though at the time didn't think it was helping.  Has felt more down and withdrawn.  Low energy, motivation, not smiling.  No SI.  Sleep ok.   Patient was administered Spravato 84 mg intranasally today.  The patient experienced the typical dissociation which gradually resolved over the 2-hour period of observation.  There were no complications.  Specifically the patient did not have nausea or vomiting or headache. No problems with BP or other unusual SE Mood is a little better after this administration of Spravato.  Very motivated to continue it.  No immediate med concerns. No SI .  Ongoing anhedonia.  12/25/22 appt noted: Psych med: belsomra 20, clonazepam 1 mg HS.  Not resumed stimulant or AD yet   No SE. Patient was administered Spravato 84 mg intranasally today.  The patient experienced  the typical dissociation which gradually resolved over the 2-hour period of observation.  There were no complications.  Specifically the patient did not have nausea or vomiting or headache.  There is a lot things going on in her life and it is somewhat difficult to separate stressors from what is going on with her mood.  However she is happy to resume Spravato as she decided after stopping briefly that it was helpful.  She plans to start the antidepressant [place]row.  She also wants to resume the stimulant tomorrow.  12/30/22 appt noted; Psych med: belsomra 20, clonazepam 1 mg HS.  Not resumed stimulant.  Effexor XR 37.5 mg daily   No SE. Patient was administered Spravato 84 mg intranasally today.  The patient experienced the typical dissociation which gradually resolved over the 2-hour period of observation.  There were no complications.  Specifically the patient did not have nausea or vomiting or headache.  She is still having depression with very prominent anhedonia.  However she feels the Spravato is somewhat helpful and is hopeful for more complete response as she gets back onto a reasonable antidepressant dose.  She tolerated Effexor XR 300 mg daily in the past and took it for 5 months and it is probably the antidepressants worked best for her.  She is agreeable with this plan.  She is getting sleep benefit from the current medications Plan: incr Effexor 75 mg daily  01/02/23 appt noted: Psych meds as noted above except increase Effexor XR to 75 mg daily without side effects. Patient was administered Spravato 84 mg intranasally today.  The patient experienced the typical dissociation which gradually resolved over the 2-hour period of observation.  There were no complications.  Specifically the patient did not have nausea or vomiting or headache.  Agrees to increasing Effexor as planned.  SX not changed from that noted above.  No SI  01/05/23 appt noted: Psych meds as noted above except increase Effexor  XR to 112.5 mg daily without side effects. Patient was administered Spravato 84 mg intranasally today.  The patient experienced the typical dissociation which gradually resolved over the 2-hour period of observation.  There were no complications.  Specifically the patient did not have nausea or vomiting or headache.  Agrees to increasing Effexor as planned gradually to 300 mg daily.  SX not changed  from that noted above.  Does however feel Spravato has dampened the degree of depression.  No SI Stressful life event discussed and won't be resolved until early next year.  01/07/23 appt noted: Psych meds Effexor XR 112.5 mg daily. Patient was administered Spravato 84 mg intranasally today.  The patient experienced the typical dissociation which gradually resolved over the 2-hour period of observation.  There were no complications.  Specifically the patient did not have nausea or vomiting or headache.  Agrees to increasing Effexor as planned gradually to 300 mg daily.  SX not changed from that noted above.  Does however feel Spravato has dampened the degree of depression markedly compared to when off psych meds and no Spravato.  No SI  01/12/23 appt noted; Psych meds Effexor XR 225 mg daily. No SE notedl. Patient was administered Spravato 84 mg intranasally today.  The patient experienced the typical dissociation which gradually resolved over the 2-hour period of observation.  There were no complications.  Specifically the patient did not have nausea or vomiting.  But is having headache.  Agrees to increasing Effexor as planned gradually to 300 mg daily.  SX not changed from that noted above.  Does however feel Spravato has dampened the degree of depression markedly compared to when off psych meds and no Spravato.  No SI  01/15/23 appt noted:  Psych meds Effexor XR 225 mg daily. No SE noted. Patient was administered Spravato 84 mg intranasally today.  The patient experienced the typical dissociation which  gradually resolved over the 2-hour period of observation.  There were no complications.  Specifically the patient did not have nausea or vomiting.   Doing ok with higher doses of Effexor and will plan to increase to 300 mg daily. Mild improvement noticed so far with the incrase in meds.   Uses Cotempla prn and it helps but doesn't liek the way she feels when it wears off.  01/22/23 appt noted: Psych meds reduced Effexor XR 150 mg daily. Bc of HA No SE noted. Patient was administered Spravato 84 mg intranasally today.  The patient experienced the typical dissociation which gradually resolved over the 2-hour period of observation.  There were no complications.  Specifically the patient did not have nausea or vomiting.  Ongoing moderate levels of depression with some improvement from the Spravato.  She is not sure about the effect of the venlafaxine on her mood yet.  She would like to continue to try pursuing a stimulant but has had difficulty tolerating p.o. versions of stimulants because of the crash at the end and side effects with many of them.  She has several questions about this.  01/27/23 appt noted: Psych meds: Clonazepam 1 to 1.5 mg nightly, lithium CR 300 nightly, Belsomra 20 nightly, venlafaxine XR 150 every morning No side effects noted at the current dosage. Patient was administered Spravato 84 mg intranasally today.  The patient experienced the typical dissociation which gradually resolved over the 2-hour period of observation.  There were no complications.  Specifically the patient did not have nausea or vomiting.  Ongoing moderate levels of depression with some improvement from the Spravato.   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  She does not tolerate oral stimulants very well because she has a crash as they wear off which is very dysphoric.  She is willing to retry the Daytrana patches that she took in the spring.   She took 1 patch  on 1 occasion recently and felt it was helpful. However she is also having problems with early morning awakening over the last week without precipitant.  She would like to have a different treatment strategy for sleep but understands her sleep problems have historically been difficult to resolve and maintain.  02/10/23 appt noted: Psych meds: Clonazepam 1 to 1.5 mg nightly, lithium CR 300 nightly, Belsomra 20 nightly, venlafaxine XR 150 every morning, Daytrana 30 mg Am No side effects noted at the current dosage. Patient was administered Spravato 84 mg intranasally today.  The patient experienced the typical dissociation which gradually resolved over the 2-hour period of observation.  There were no complications.  Specifically the patient did not have nausea or vomiting.  Ongoing moderate levels of depression with some improvement from the Spravato.  Particularly anhedonia.  No sig benefit noted with Daytrana and problems with table stimulants bc gets brief benefit with unpleasant "crash".  Wants to try another type of patch if available.  02/17/23 appt noted: Psych meds: Clonazepam 1 to 1.5 mg nightly, lithium CR 300 nightly, Belsomra 20 nightly, venlafaxine XR 225 mg every morning, Daytrana 30 mg Am No side effects noted at the current dosage. Patient was administered Spravato 84 mg intranasally today.  The patient experienced the typical dissociation which gradually resolved over the 2-hour period of observation.  There were no complications.  Specifically the patient did not have nausea or vomiting.  Ongoing moderate levels of depression with some improvement from the Spravato.  Particularly anhedonia.  She has not gotten the new stimulant yet but is hopeful. No problems with increasing venlafaxine.   Mood is better with Spravato and venlafaxine.  Less anhedonia, less down. Themazepam not helpful.    AIMS    Flowsheet Row Video Visit from 01/03/2022 in [facility]  AIMS Total Score 0      ECT-MADRS    Flowsheet Row Clinical Support from 08/13/2022 in [facility] Video Visit from 04/29/2022 in [facility] Video Visit from 02/06/2022 in [facility]  MADRS Total Score 27 44 23      GAD-7    Flowsheet Row Counselor from 12/19/2022 in [facility] Office Visit from 08/22/2022 in [facility] [place]  Total GAD-7 Score 4 7      PHQ2-9    Flowsheet Row Counselor from 12/19/2022 in [facility] Office Visit from 08/22/2022 in [facility] [place] Office Visit from 08/19/2022 in [facility] at [facility] Video Visit from 02/06/2022 in [facility] Office Visit from 11/16/2020 in [facility] for [facility] at [facility] Total Score 2 3 0 6 0  PHQ-9 Total Score 5 5 -- 14 --        Past Psychiatric Medication Trials: Trintellix - Initially effective and then was not effective when re-started Paxil- Ineffective. Helped initially. Prozac-Insomnia Lexapro- Effective and then no longer as effective Zoloft- Initially effective and then minimal response Viibryd Cymbalta- Ineffective. Helped initially. Effexor XR 300- Effective, well tolerated. Pristiq- Increased anxiety at 150 mg daily Wellbutrin XL-Ineffective.May have increased anxiety. Amitriptyline Nortriptyline-Caused irritability, constipation Auvelity- ineffective Selegilne brief 20 mg daily , more irritable  Abilify- Helpful but caused severe insomnia. Rexulti-Helpful but caused insomnia. Vraylar Seroquel - Ineffective Risperdal Olanzapine- Ineffective Latuda- Ineffective Caplyta  Klonopin- Effective Temazepam- Took during menopause Lunesta- Ineffective  Ambien-Ineffective Sonata-  Ineffective Dayvigo- partially effective  Belsomra 20 worked and then failed  Trazodone-  somewhat effective Remeron-Ineffective. Does not recall wt gain.  Quetiapine 25 NR  Adderall- Took once and had adverse effect. Concerta- Effective but only 4-6 hours Jornay 80 NR Metadate-Not as effective as Concerta Dexmethylphenidate ER 10/02/21- Not as effective as Concerta Azstarys- some side effects. Not as effective.  Ritalin short acting and SE anxiety & HTN @20  TID Cotempla 17.3  Daytrana NR  Lamictal- May have had an adverse effects. "Felt weird." Reports worsening s/s.  Lithium ? effect Gabapentin Pramipexole- 1 mg QID NR  ECT #20 with minimal response. H says it was partly helpful.  AIMS    Flowsheet Row Video Visit from 01/03/2022 in [facility]  AIMS Total Score 0      ECT-MADRS    Flowsheet Row Clinical Support from 08/13/2022 in [facility] Video Visit from 04/29/2022 in [facility] Video Visit from 02/06/2022 in [facility]  MADRS Total Score 27 44 23      GAD-7    Flowsheet Row Counselor from 12/19/2022 in [facility] Office Visit from 08/22/2022 in [facility] at [place]  Total GAD-7 Score 4 7      PHQ2-9    Flowsheet Row Counselor from 12/19/2022 in [facility] Office Visit from 08/22/2022 in [facility] [place] Office Visit from 08/19/2022 in [facility] at [facility] Video Visit from 02/06/2022 in [facility] Office Visit from 11/16/2020 in [facility] at [facility] Total Score 2 3 0 6 0  PHQ-9 Total Score 5 5 -- 14 --        Review of Systems:  Review of Systems  Constitutional:  Positive for fatigue.  Cardiovascular:  Negative for chest  pain and palpitations.  Neurological:  Negative for dizziness, tremors and headaches.  Psychiatric/Behavioral:  Positive for dysphoric mood and sleep disturbance. Negative for decreased concentration and suicidal ideas. The patient is nervous/anxious.     Medications: I have reviewed the patient's current medications.  Current Outpatient Medications  Medication Sig Dispense Refill   clonazePAM (KLONOPIN) 1 MG tablet Take 1-2 tablets (1-2 mg total) by mouth at bedtime. 60 tablet 0   Esketamine HCl, 84 MG Dose, (SPRAVATO, 84 MG DOSE,) 28 MG/DEVICE SOPK Place 84 mg into the nose every 7 (seven) days. 3 each 5   Estradiol (VAGIFEM) 10 MCG TABS vaginal tablet Place 1 tablet (10 mcg total) vaginally 2 (two) times a week. 24 tablet 3   lisdexamfetamine (VYVANSE) 40 MG capsule Take 1 capsule (40 mg total) by mouth every morning. 30 capsule 0   Suvorexant (BELSOMRA) 20 MG TABS Take 1 tablet (20 mg total) by mouth at bedtime. 30 tablet 0   valACYclovir (VALTREX) 500 MG tablet Take one twice daily for 3 days with any symptoms 30 tablet 3   valsartan (DIOVAN) 80 MG tablet Take 1 tablet (80 mg total) by mouth daily. 90 tablet 3   Dextroamphetamine (XELSTRYM) 13.5 MG/9HR PTCH Place 1 patch onto the skin every morning. (Patient not taking: Reported on 02/23/2023) 30 patch 0   lithium carbonate (LITHOBID) 300 MG ER tablet Take 2 tablets (600 mg total) by mouth at bedtime.     venlafaxine XR (EFFEXOR-XR) 150 MG 24 hr capsule Take 1 capsule (  150 mg total) by mouth 2 (two) times daily. 60 capsule 0   No current facility-administered medications for this visit.    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   TRANSAMINASES, SERUM, ELEVATED 08/14/2009    Past Medical History, Surgical history, Social history, and Family history were reviewed and updated as appropriate.   Please see review of systems for further details on the patient's review from today.   Objective:   Physical Exam:  LMP 12/27/2007 (Exact Date)   Physical Exam Constitutional:      General: She is not in acute distress. Musculoskeletal:        General: No deformity.  Neurological:     Mental Status: She is alert and oriented to person, place, and time.     Coordination: Coordination normal.  Psychiatric:        Attention and Perception: Perception normal. She is inattentive. She does not perceive auditory hallucinations.        Mood and Affect: Mood is anxious and depressed. Affect is blunt. Affect is not tearful.        Speech: Speech normal.        Behavior: Behavior normal.        Thought Content: Thought content normal. Thought content is not delusional. Thought content does not include homicidal or suicidal ideation. Thought content does not include suicidal plan.        Cognition and Memory: Cognition and memory normal.        Judgment: Judgment normal.     Comments: Insight intact Mood still mod dep but better with Spravato.  She is doing better overall. Affect blunted but with a little more expression      Lab Review:     Component Value Date/Time   NA 132 (L) 10/27/2022 1107   K 4.4 10/27/2022 1107   CL 95 (L) 10/27/2022 1107   CO2 30 10/27/2022 1107   GLUCOSE 89 10/27/2022 1107   BUN 14 10/27/2022 1107   CREATININE 0.67 10/27/2022 1107   CREATININE 0.68 01/26/2020 0920   CALCIUM 10.1 10/27/2022 1107   PROT 8.3 04/22/2022 0932   ALBUMIN 5.3 (H) 04/22/2022 0932   AST 82 (H) 04/22/2022 0932   ALT 118 (H) 04/22/2022 0932   ALKPHOS 74 04/22/2022 0932   BILITOT 0.3 04/22/2022 0932   GFRNONAA >90 05/01/2014 1645   GFRAA >90 05/01/2014 1645        Component Value Date/Time   WBC 3.3 (L) 04/22/2022 0932   RBC 4.25 04/22/2022 0932   HGB 14.3 04/22/2022 0932   HGB 14.1 10/26/2014 1034   HCT 40.7 04/22/2022 0932   PLT 245.0 04/22/2022 0932   MCV 95.7 04/22/2022 0932   MCH 34.4 (H) 01/26/2020 0920   MCHC 35.1 04/22/2022 0932   RDW 13.2 04/22/2022 0932   LYMPHSABS 0.9 04/22/2022 0932   MONOABS 0.2 04/22/2022 0932   EOSABS 0.1 04/22/2022 0932   BASOSABS 0.0 04/22/2022 0932    No results found for: "POCLITH", "LITHIUM"   No results found for: "PHENYTOIN", "PHENOBARB", "VALPROATE", "CBMZ"   .res Assessment:  Plan:    Heather Davila was seen today for follow-up, depression, anxiety, add, fatigue and sleeping problem.  Diagnoses and all orders for this visit:  Recurrent major depression resistant to treatment (HCC) -     venlafaxine XR (EFFEXOR-XR) 150 MG 24 hr capsule; Take 1 capsule (150 mg total) by mouth 2 (two) times daily. -     lisdexamfetamine (VYVANSE) 40 MG capsule; Take 1 capsule (40 mg total) by mouth every morning. -     lithium carbonate (LITHOBID) 300 MG ER tablet; Take 2 tablets (600 mg total) by mouth at bedtime.  Attention deficit hyperactivity disorder (ADHD), predominantly inattentive type -     lisdexamfetamine (VYVANSE) 40 MG capsule; Take 1 capsule (40 mg total) by mouth every morning.  Generalized anxiety disorder -     venlafaxine XR (EFFEXOR-XR) 150 MG 24 hr capsule; Take 1 capsule (150 mg total) by mouth 2 (two) times daily.  Insomnia, unspecified type       Pt seen for TRD with multiple failed medications as noted above.    Mood is better with venlafaxine XR 225 with Spravato but ongoing dep with anhedonia and fatigue  Patient was administered Spravato 84 mg intranasally today.  The patient experienced the typical dissociation which gradually resolved over the 2-hour period of observation.  There were no complications.  Specifically the patient did not have nausea or vomiting or headache.  Blood  pressures remained within normal ranges at the 40-minute and 2-hour follow-up intervals.  By the time the 2-hour observation period was met the patient was alert and oriented and able to exit without assistance.  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  See nursing note for further details.  Consider switch Spravato to MAOI, [place]ate  We discussed the short-term risks associated with benzodiazepines including sedation and increased fall risk among others.  Discussed long-term side effect risk including dependence, potential withdrawal symptoms, and the potential eventual dose-related risk of dementia.  But recent studies from 2020 dispute this association between benzodiazepines and dementia risk. Newer studies in 2020 do not support an association with dementia.  for insomnia, wants to continue clonazepam 1.5 -2 mg nightly.  She wants to continue Belsomra partially effective.  Cotempla or Concerta bc helps mood early in day and focus  benefit lasts longer.  Have not been able to get stimulant to be consitently effective throughout the day. Conisder switching to patch bc pt has significant crash with stimulants or perhaps [place].  Stimulant tablets cause crash with inadequate duration .  Patches should provide smoother response. Reports no response to Daytrana for mood and limited benefit for ADD and productivity. Will try another patch, Noemi Chapel if we can get it approved.   Insurance denied PA for this medication stating she has to try dextroamphetamine ER, Adderall XR, and Vyvanse before will be considered. Dexmethylphenidate ER 10/02/21- Not as effective as Concerta Have been to try Adderall XR because she did not tolerate Adderall at home I am reluctant to prescribe Vyvanse for the same reason but we can try a low dose of Vyvanse in hopes of avoiding side effects she had with Adderall.  Discussed potential  benefits, risks, and side effects of stimulants with patient to include increased heart rate, palpitations, insomnia, increased anxiety, increased irritability, or decreased appetite.  Instructed patient to contact office if experiencing any significant tolerability issues.  She's ready to increase venlafaxine XR to 300 mg daily.  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  Disc pretreating Spravato with NSAID or Tylenol bc of HA lately.  Supportive therapy dealing with serious stressful life event.  FU twice weekly Spravato until next week then try to drop back to weekly .  Meredith Staggers, MD, DFAPA    Please see After Visit Summary for patient specific instructions.  Future Appointments  Date Time Provider Department Center  03/02/2023  2:00 PM Gaspar Bidding [facility] CP-CP None  03/11/2023  9:00 AM Gaspar Bidding, [facility] CP-CP None  03/18/2023 11:00 AM Gaspar Bidding, [facility] CP-CP None  03/24/2023  9:00 AM Gaspar Bidding, [facility] CP-CP None  04/02/2023  9:00 AM Gaspar Bidding, [facility] CP-CP None  09/18/2023  8:15 AM Jerene Bears, MD [facility]    No orders of the defined types were placed in this encounter.   -------------------------------

## 2023-02-24 ENCOUNTER — Other Ambulatory Visit: Payer: Self-pay

## 2023-02-24 DIAGNOSIS — F339 Major depressive disorder, recurrent, unspecified: Secondary | ICD-10-CM

## 2023-02-24 MED ORDER — LITHIUM CARBONATE ER 300 MG PO TBCR: 600.0000 mg | EXTENDED_RELEASE_TABLET | Freq: Every evening | ORAL | 0 refills | Status: DC

## 2023-02-25 ENCOUNTER — Encounter: Payer: 59 | Admitting: Psychiatry

## 2023-02-26 NOTE — Telephone Encounter (Signed)
Pharmacy reporting PA denied. Are you doing an appeal?

## 2023-02-27 NOTE — Telephone Encounter (Signed)
Not that I am aware of.

## 2023-03-02 ENCOUNTER — Ambulatory Visit (INDEPENDENT_AMBULATORY_CARE_PROVIDER_SITE_OTHER): Payer: 59 | Admitting: Professional Counselor

## 2023-03-02 ENCOUNTER — Other Ambulatory Visit: Payer: Self-pay | Admitting: Psychiatry

## 2023-03-02 DIAGNOSIS — F339 Major depressive disorder, recurrent, unspecified: Secondary | ICD-10-CM

## 2023-03-03 NOTE — Telephone Encounter (Signed)
Can you refuse this please

## 2023-03-04 ENCOUNTER — Ambulatory Visit (INDEPENDENT_AMBULATORY_CARE_PROVIDER_SITE_OTHER): Payer: 59 | Admitting: Physician Assistant

## 2023-03-04 ENCOUNTER — Ambulatory Visit: Payer: 59

## 2023-03-04 VITALS — BP 117/82 | HR 77

## 2023-03-04 DIAGNOSIS — F411 Generalized anxiety disorder: Secondary | ICD-10-CM

## 2023-03-04 DIAGNOSIS — F339 Major depressive disorder, recurrent, unspecified: Secondary | ICD-10-CM | POA: Diagnosis not present

## 2023-03-04 DIAGNOSIS — F900 Attention-deficit hyperactivity disorder, predominantly inattentive type: Secondary | ICD-10-CM | POA: Diagnosis not present

## 2023-03-04 DIAGNOSIS — G4700 Insomnia, unspecified: Secondary | ICD-10-CM | POA: Diagnosis not present

## 2023-03-04 MED ORDER — DEXMETHYLPHENIDATE HCL ER 40 MG PO CP24: 40.0000 mg | ORAL_CAPSULE | Freq: Every morning | ORAL | 0 refills | Status: DC

## 2023-03-04 NOTE — Progress Notes (Signed)
[facility] Med Check  Patient ID: Heather Davila,  MRN: [id number]  PCP: [name], MD  Date of Evaluation: 03/04/2023 Time spent:40 minutes  Chief Complaint:  Chief Complaint   Depression; Other     HISTORY/CURRENT STATUS: HPI For Spravato.   Heather Davila is a pt of Dr. Jennelle Human, I'm seeing her in his absence. She feels like the Spravato continues to help the depression. [place] has improved. Energy can be low, depending on the situation. Difficulty focusing, staying on task. Vyvanse not helpful. No change in sx.  No extreme sadness, tearfulness, or feelings of hopelessness.  Sleeps fairly well.  ADLs and personal hygiene are normal.  Appetite has not changed.  Weight is stable. No PA but gets overwhelmed easily.  Klonopin helps.   Denies suicidal or homicidal thoughts.  Patient denies increased energy with decreased need for sleep, increased talkativeness, racing thoughts, impulsivity or risky behaviors, increased spending, increased libido, grandiosity, increased irritability or anger, paranoia, or hallucinations.  Denies dizziness, syncope, seizures, numbness, tingling, tremor, tics, unsteady gait, slurred speech, confusion. Denies muscle or joint pain, stiffness, or dystonia.  Individual Medical History/ Review of Systems: Changes? :No   Past Psychiatric Medication Trials: Trintellix - Initially effective and then was not effective when re-started Paxil- Ineffective. Helped initially. Prozac-Insomnia Lexapro- Effective and then no longer as effective Zoloft- Initially effective and then minimal response Viibryd Cymbalta- Ineffective. Helped initially. Effexor XR 300- Effective, well tolerated. Pristiq- Increased anxiety at 150 mg daily Wellbutrin XL-Ineffective.May have increased anxiety. Amitriptyline Nortriptyline-Caused irritability, constipation Auvelity- ineffective Selegilne brief 20 mg daily , more irritable   Abilify- Helpful but caused severe  insomnia. Rexulti-Helpful but caused insomnia. Vraylar Seroquel - Ineffective Risperdal Olanzapine- Ineffective Latuda- Ineffective Caplyta   Klonopin- Effective Temazepam- Took during menopause Lunesta- Ineffective Ambien-Ineffective Sonata- Ineffective Dayvigo- partially effective  Belsomra 20 worked and then failed  Trazodone-  somewhat effective Remeron-Ineffective. Does not recall wt gain.  Quetiapine 25 NR   Adderall- Took once and had adverse effect. Concerta- Effective but only 4-6 hours Jornay 80 NR Metadate-Not as effective as Concerta Dexmethylphenidate ER 10/02/21- Not as effective as Concerta Azstarys- some side effects. Not as effective.  Ritalin short acting and SE anxiety & HTN @20  TID Cotempla 17.3  Daytrana NR   Lamictal- May have had an adverse effects. "Felt weird." Reports worsening s/s.  Lithium ? effect Gabapentin Pramipexole- 1 mg QID NR   ECT #20 with minimal response. H says it was partly helpful.  Allergies: Ciprofloxacin and Oxycodone-acetaminophen  Current Medications:  Current Outpatient Medications:    clonazePAM (KLONOPIN) 1 MG tablet, Take 1-2 tablets (1-2 mg total) by mouth at bedtime., Disp: 60 tablet, Rfl: 0   Esketamine HCl, 84 MG Dose, (SPRAVATO, 84 MG DOSE,) 28 MG/DEVICE SOPK, PLACE 84 MG INTO THE NOSE EVERY 7 (SEVEN) DAYS., Disp: 3 each, Rfl: 1   Estradiol (VAGIFEM) 10 MCG TABS vaginal tablet, Place 1 tablet (10 mcg total) vaginally 2 (two) times a week., Disp: 24 tablet, Rfl: 3   lisdexamfetamine (VYVANSE) 40 MG capsule, Take 1 capsule (40 mg total) by mouth every morning., Disp: 30 capsule, Rfl: 0   lithium carbonate (LITHOBID) 300 MG ER tablet, Take 2 tablets (600 mg total) by mouth at bedtime., Disp: 60 tablet, Rfl: 0   Suvorexant (BELSOMRA) 20 MG TABS, Take 1 tablet (20 mg total) by mouth at bedtime., Disp: 30 tablet, Rfl: 0   valACYclovir (VALTREX) 500 MG tablet, Take one twice daily for 3 days with any symptoms, Disp:  30  tablet, Rfl: 3   valsartan (DIOVAN) 80 MG tablet, Take 1 tablet (80 mg total) by mouth daily., Disp: 90 tablet, Rfl: 3   venlafaxine XR (EFFEXOR-XR) 150 MG 24 hr capsule, Take 1 capsule (150 mg total) by mouth 2 (two) times daily., Disp: 60 capsule, Rfl: 0   Dexmethylphenidate HCl 40 MG CP24, Take 1 capsule (40 mg total) by mouth every morning., Disp: 15 capsule, Rfl: 0   Dextroamphetamine (XELSTRYM) 13.5 MG/9HR PTCH, Place 1 patch onto the skin every morning. (Patient not taking: Reported on 02/23/2023), Disp: 30 patch, Rfl: 0 Medication Side Effects: none  Family Medical/ Social History: Changes? No  MENTAL HEALTH EXAM:  Last menstrual period 12/27/2007.There is no height or weight on file to calculate BMI.  General Appearance: Casual and Well Groomed  Eye Contact:  Good  Speech:  Clear and Coherent and Normal Rate  Volume:  Normal  Mood:  Euthymic  Affect:  Congruent  Thought Process:  Goal Directed and Descriptions of Associations: Circumstantial  Orientation:  Full (Time, Place, and Person)  Thought Content: Logical   Suicidal Thoughts:  No  Homicidal Thoughts:  No  Memory:  WNL  Judgement:  Good  Insight:  Good  Psychomotor Activity:  Normal  Concentration:  Concentration: Good  Recall:  Good  Fund of Knowledge: Good  Language: Good  Assets:  Communication Skills Desire for Improvement Financial Resources/Insurance Housing Transportation  ADL's:  Intact  Cognition: WNL  Prognosis:  Good   Patient was administered Spravato 84 mg intranasally today.  The patient experienced the typical dissociation which gradually resolved over the 2-hour period of observation.  There were no complications.  Specifically the patient did not have nausea or vomiting or headache.  Blood pressures remained within normal ranges at the 40-minute and 2-hour follow-up intervals.  By the time the 2-hour observation period was met the patient was alert and oriented and able to exit without  assistance.  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  See nursing note for further details.  DIAGNOSES:    ICD-10-CM   1. 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 Attention deficit hyperactivity disorder (ADHD), predominantly inattentive type  F90.0     3. Generalized anxiety disorder  F41.1     4. Insomnia, unspecified type  G47.00       Receiving Psychotherapy: Yes  with Lieutenant Diego, [facility]  RECOMMENDATIONS:  PDMP reviewed.  Spravato filled 02/26/2023.  Vyvanse filled 02/23/2023. I provided 40 minutes of face to face time during this encounter, including time spent before and after the visit in records review, medical decision making, counseling pertinent to today's visit, and charting.   We discussed the ADHD. She's tried multiple meds which failed, hasn't tried Focalin so will change to that. Benefits, risks and SE were discussed and she accepts.   As far as the depression goes, she's doing better so no changes needed.   Stop Vyvanse.   Cont Klonpin 1 mg, 1-2 po at bedtime prn.  Start Focalin XR 40 mg, 1 po q am.  Cont Spravato per Dr. Jennelle Human.  Cont Belsomra 20 mg, 1 at bedtime. Continue Effexor XR 150 mg, 1 po bid Continue therapy with Lieutenant Diego, [facility].  Return in 1 week.   Melony Overly, PA-C

## 2023-03-04 NOTE — Progress Notes (Signed)
NURSES NOTE:           Patient arrived for her #58 Spravato treatment. Pt is coming once a week now. Pt is being treated for Treatment Resistant Depression, pt will be receiving 84 mg (3 of the 28 mg) nasal sprays, this is her maintenance dose. Patient taken to treatment room, I explained and discussed her treatment and how the schedule should go, along with any side effects that may occur. Answered any questions and concerns the patient had. Pt's Spravato is delivered through [place] and she is now billing with her insurance. Spravato medication is stored at doctors office per REMS/FDA guidelines. The medication is required to be locked behind two doors per FDA/REMS Protocol. Medication is also disposed of properly per regulations. All treatments and her vital signs are documented in Spravato REMS per protocol of treatment center regulations. [facility] delivers her medication.      Vital Signs assessed at 8:00 AM 120/81, pulse 72, pulse ox 92%. Instructed pt to blow her nose and recline back slightly to prevent any of medication dripping out of her nose.   Pt. Given 1st dose (28 mg inhaler), then 5 minutes between 2nd dose and 3rd dose for total of 84 mg. No complaints of nausea/vomiting reported. Pt did listen to music today, she just reclined back and closed her eyes. After 40 minutes I went to assess her vital signs, no side effects or symptoms reported, B/P 126/77, pulse 76. Dr. Jennelle Human talked to pt today about her treatment. Nurse was with pt a total of 60 minutes but pt observed for 120 minutes per protocol. Discharge vital signs were at 10:00 AM, B/P 117/82, pulse 77. She verbalized understanding. She reports dissociation but she was clear upon her discharge. She is scheduled next week on Thursday, November 14th. She is instructed to contact office if needing anything prior to her next treatment.     LOT # [id number]   EXP FEB 2027

## 2023-03-08 ENCOUNTER — Encounter: Payer: Self-pay | Admitting: Physician Assistant

## 2023-03-11 ENCOUNTER — Ambulatory Visit (INDEPENDENT_AMBULATORY_CARE_PROVIDER_SITE_OTHER): Payer: 59 | Admitting: Professional Counselor

## 2023-03-11 ENCOUNTER — Encounter: Payer: Self-pay | Admitting: Psychiatry

## 2023-03-11 ENCOUNTER — Encounter: Payer: Self-pay | Admitting: Professional Counselor

## 2023-03-11 DIAGNOSIS — F339 Major depressive disorder, recurrent, unspecified: Secondary | ICD-10-CM

## 2023-03-11 DIAGNOSIS — F411 Generalized anxiety disorder: Secondary | ICD-10-CM

## 2023-03-11 NOTE — Progress Notes (Addendum)
      [facility] Counselor/Therapist Progress Note  Patient ID: Heather Davila, MRN: [id number],    Date: 03/11/2023  Time Spent: 9:13a - 10:10a   Treatment Type: Individual Therapy  Reported Symptoms: anhedonia, low mood, fatigue, worries, trouble relaxing, decision paralysis, stress, interpersonal concerns   Mental Status Exam:  Appearance:   Neat     Behavior:  Appropriate, Sharing, and Motivated  Motor:  Normal  Speech/Language:   Clear and Coherent and Normal Rate  Affect:  Appropriate and Congruent  Mood:  normal  Thought process:  normal  Thought content:    WNL  Sensory/Perceptual disturbances:    WNL  Orientation:  Sound  Attention:  Good  Concentration:  Good  Memory:  WNL  Fund of knowledge:   Good  Insight:    Good  Judgment:   Good  Impulse Control:  Good   Risk Assessment: Danger to Self:  No Self-injurious Behavior: No Danger to Others: No Duty to Warn:no Physical Aggression / Violence:No  Access to Firearms a concern: No  Gang Involvement:No   Subjective: Pt presented to session to address concerns of anxiety and depression as detailed above. Pt reported some progress in that she had reflected on counseling goals. Counselor and pt discussed updated treatment plan and pt gave her consent. Pt reflected on challenges relaxing, identifying emotions, recalling her mother's funeral, and making decisions. Counselor and pt explored these topics and counselor helped to facilitate insight. Pt voiced ongoing significant concern for a circumstance of husband's that could also impact family; counselor assisted pt with strategies to approach stressful topic gently. Pt voiced ongoing frustration with aspects of son's behavior; counselor helped pt with strategies to approach these objectives with firmness. Pt identified strategies for each as STG between sessions.  Interventions: Solution-Oriented/Positive Psychology, Humanistic/Existential, Insight-Oriented  Diagnosis:    ICD-10-CM   1. 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 Generalized anxiety disorder  F41.1       Plan: Pt is scheduled for a follow-up; continue process work and developing coping skills. Pt to practice strategies regarding interpersonal concerns as discussed in session; and to journal with greater intention.  Gaspar Bidding, [facility]

## 2023-03-12 ENCOUNTER — Ambulatory Visit: Payer: 59 | Admitting: Behavioral Health

## 2023-03-12 ENCOUNTER — Ambulatory Visit: Payer: 59

## 2023-03-12 VITALS — BP 103/72 | HR 72

## 2023-03-12 DIAGNOSIS — F339 Major depressive disorder, recurrent, unspecified: Secondary | ICD-10-CM | POA: Diagnosis not present

## 2023-03-12 NOTE — Progress Notes (Signed)
Heather Davila [id number] 11-08-1960 62 y.o.  Subjective:   Patient ID:  Heather Davila is a 62 y.o. (DOB April 19, 1961) female.  Chief Complaint: No chief complaint on file.   HPI  Jessia Lusco presents to the office today for follow-up of treatment resistant depression (TRD).   Spravato treatment    Patient was administered Spravato 84 mg intranasally today. Patient was observed by provider throughout [facility] treatment. The patient experienced the typical dissociation which gradually resolved over the 2-hour period of observation. There were no complications. Specifically, the patient did not have any untoward side effects - feeling disconnected from themself, their thoughts, feelings and things around them, dizziness, nausea, feeling sleepy, decreased feeling of sensitivity (numbness) spinning sensation, feeling anxious, lack of energy, increased blood pressure, feeling happy or very excited, or headache. Blood pressures remained within normal ranges at the 40-minute and 2-hour follow-up intervals. By the time the 2-hour observation period was met the patient was alert and oriented and able to exit without assistance. Patient willing to continue Spravato administration for the treatment of resistant depression.  Reports Depression today 2/10.  Anxiety 2/10.  See nursing note for further details.    AIMS    Flowsheet Row Video Visit from 01/03/2022 in [facility]  AIMS Total Score 0      ECT-MADRS    Flowsheet Row Clinical Support from 08/13/2022 in [facility] Video Visit from 04/29/2022 in [facility] Video Visit from 02/06/2022 in [facility]  MADRS Total Score 27 44 23      GAD-7    Flowsheet Row Counselor from 12/19/2022 in [facility] Office Visit from 08/22/2022 in [facility] [place]  Total GAD-7 Score 4 7       PHQ2-9    Flowsheet Row Counselor from 12/19/2022 in [facility] Office Visit from 08/22/2022 in [facility] [place] Office Visit from 08/19/2022 in [facility] Video Visit from 02/06/2022 in [facility] Office Visit from 11/16/2020 in [facility] for [facility] Total Score 2 3 0 6 0  PHQ-9 Total Score 5 5 -- 14 --        Review of Systems:  Review of Systems  Constitutional: Negative.   Allergic/Immunologic: Negative.   Neurological: Negative.   Psychiatric/Behavioral:  Positive for dysphoric mood.     Medications: I have reviewed the patient's current medications.  Current Outpatient Medications  Medication Sig Dispense Refill   clonazePAM (KLONOPIN) 1 MG tablet Take 1-2 tablets (1-2 mg total) by mouth at bedtime. 60 tablet 0   Dexmethylphenidate HCl 40 MG CP24 Take 1 capsule (40 mg total) by mouth every morning. 15 capsule 0   Dextroamphetamine (XELSTRYM) 13.5 MG/9HR PTCH Place 1 patch onto the skin every morning. (Patient not taking: Reported on 02/23/2023) 30 patch 0   Esketamine HCl, 84 MG Dose, (SPRAVATO, 84 MG DOSE,) 28 MG/DEVICE SOPK PLACE 84 MG INTO THE NOSE EVERY 7 (SEVEN) DAYS. 3 each 1   Estradiol (VAGIFEM) 10 MCG TABS vaginal tablet Place 1 tablet (10 mcg total) vaginally 2 (two) times a week. 24 tablet 3   lithium carbonate (LITHOBID) 300 MG ER tablet Take 2 tablets (600 mg total) by mouth at bedtime. 60 tablet 0   Suvorexant (BELSOMRA) 20 MG TABS Take 1 tablet (20 mg total)  by mouth at bedtime. 30 tablet 0   valACYclovir (VALTREX) 500 MG tablet Take one twice daily for 3 days with any symptoms 30 tablet 3   valsartan (DIOVAN) 80 MG tablet Take 1 tablet (80 mg total) by mouth daily. 90 tablet 3   venlafaxine XR (EFFEXOR-XR) 150 MG 24 hr capsule Take 1 capsule (150 mg total) by mouth 2 (two)  times daily. 60 capsule 0   No current facility-administered medications for this visit.    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   TRANSAMINASES, SERUM, ELEVATED 08/14/2009    Past Medical History, Surgical history, Social history, and Family history were reviewed and updated as appropriate.   Please see review of systems for further details on the patient's review from today.   Objective:   Physical Exam:  LMP 12/27/2007 (Exact Date)   Physical Exam Psychiatric:        Attention and Perception: Attention and perception normal.        Mood and Affect: Mood and affect normal.        Speech: Speech normal.        Behavior: Behavior normal. Behavior is cooperative.        Cognition and Memory: Cognition and memory normal.        Judgment: Judgment normal.     Lab Review:     Component Value Date/Time   NA 132 (L) 10/27/2022 1107   K 4.4 10/27/2022 1107   CL 95 (L) 10/27/2022 1107   CO2 30 10/27/2022 1107   GLUCOSE 89 10/27/2022 1107   BUN 14 10/27/2022 1107   CREATININE 0.67 10/27/2022 1107   CREATININE 0.68 01/26/2020 0920   CALCIUM 10.1 10/27/2022 1107   PROT 8.3 04/22/2022 0932   ALBUMIN 5.3 (H) 04/22/2022 0932   AST 82 (H) 04/22/2022 0932   ALT 118 (H) 04/22/2022 0932   ALKPHOS 74 04/22/2022 0932   BILITOT 0.3 04/22/2022 0932   GFRNONAA >90 05/01/2014 1645   GFRAA >90 05/01/2014 1645       Component Value Date/Time   WBC 3.3 (L) 04/22/2022 0932   RBC 4.25 04/22/2022 0932   HGB 14.3 04/22/2022 0932   HGB 14.1 10/26/2014 1034   HCT 40.7 04/22/2022 0932   PLT 245.0 04/22/2022 0932   MCV 95.7 04/22/2022  0932   MCH 34.4 (H) 01/26/2020 0920   MCHC 35.1 04/22/2022 0932   RDW 13.2 04/22/2022 0932   LYMPHSABS 0.9 04/22/2022 0932   MONOABS 0.2 04/22/2022 0932   EOSABS 0.1 04/22/2022 0932   BASOSABS 0.0 04/22/2022 0932    No results found for: "POCLITH", "LITHIUM"   No results found for: "PHENYTOIN", "PHENOBARB", "VALPROATE", "CBMZ"   .res Assessment: Plan:   Recommendations/Plan   Continue Spravato treatment.  Pt requesting trial of Xelstrym    Patient advised to contact office with any questions, adverse effects, or acute worsening in signs and symptoms.         Diagnoses and all orders for this visit:  Recurrent major depression resistant to treatment [facility])     Please see After Visit  Summary for patient specific instructions.  Future Appointments  Date Time Provider Department Center  03/18/2023 11:00 AM Gaspar Bidding, [facility] CP-CP None  03/19/2023  9:00 AM Cottle, Steva Ready., MD CP-CP None  03/19/2023  9:00 AM CP-NURSE CP-CP None  03/23/2023  9:00 AM Cottle, Steva Ready., MD CP-CP None  03/23/2023  9:00 AM CP-NURSE CP-CP None  03/24/2023  9:00 AM Gaspar Bidding, [facility] CP-CP None  04/02/2023  9:00 AM Gaspar Bidding, [facility] CP-CP None  04/06/2023  9:00 AM Gaspar Bidding, [facility] CP-CP None  05/01/2023  9:00 AM Gaspar Bidding, [facility] CP-CP None  05/15/2023  9:00 AM Gaspar Bidding, [facility] CP-CP None  05/29/2023  9:00 AM Gaspar Bidding, [facility] CP-CP None  09/18/2023  8:15 AM Jerene Bears, MD [facility]    No orders of the defined types were placed in this encounter.   -------------------------------

## 2023-03-13 ENCOUNTER — Other Ambulatory Visit: Payer: Self-pay | Admitting: Psychiatry

## 2023-03-13 ENCOUNTER — Other Ambulatory Visit: Payer: Self-pay

## 2023-03-13 DIAGNOSIS — G4700 Insomnia, unspecified: Secondary | ICD-10-CM

## 2023-03-13 DIAGNOSIS — F339 Major depressive disorder, recurrent, unspecified: Secondary | ICD-10-CM

## 2023-03-13 NOTE — Telephone Encounter (Signed)
Pt requesting refill for Clonazepam 1 mg #60, last refill 10/22 will pend with correct fill date.  Also pt asking for a new Gabapentin Rx for sleep. She had an old prescription given to her by Corie Chiquito, NP so she wanted to retry it to see if more effective for sleep, she reports taking 600 mg Gabapentin at hs for sleep, and if she wakes up in the middle of the night she takes 200 mg.  Pt did confirm she is not taking any other sleep medications other than her Clonazepam.

## 2023-03-16 ENCOUNTER — Other Ambulatory Visit: Payer: Self-pay | Admitting: Psychiatry

## 2023-03-16 DIAGNOSIS — G4700 Insomnia, unspecified: Secondary | ICD-10-CM

## 2023-03-16 DIAGNOSIS — F419 Anxiety disorder, unspecified: Secondary | ICD-10-CM

## 2023-03-16 MED ORDER — GABAPENTIN 300 MG PO CAPS: 900.0000 mg | ORAL_CAPSULE | Freq: Every day | ORAL | 0 refills | Status: DC

## 2023-03-16 NOTE — Progress Notes (Signed)
NURSES NOTE:           Patient arrived for her #59 Spravato treatment. Pt is coming once a week now. Pt is being treated for Treatment Resistant Depression, pt will be receiving 84 mg (3 of the 28 mg) nasal sprays, this is her maintenance dose. Patient taken to treatment room, I explained and discussed her treatment and how the schedule should go, along with any side effects that may occur. Answered any questions and concerns the patient had. Pt's Spravato is delivered through [place] and she is now billing with her insurance. Spravato medication is stored at doctors office per REMS/FDA guidelines. The medication is required to be locked behind two doors per FDA/REMS Protocol. Medication is also disposed of properly per regulations. All treatments and her vital signs are documented in Spravato REMS per protocol of treatment center regulations. [facility] delivers her medication.      Vital Signs assessed at 8:09 AM 129/79, pulse 64, pulse ox 92%. Instructed pt to blow her nose and recline back slightly to prevent any of medication dripping out of her nose.   Pt. Given 1st dose (28 mg inhaler), then 5 minutes between 2nd dose and 3rd dose for total of 84 mg. No complaints of nausea/vomiting reported. Pt did listen to music today, she just reclined back and closed her eyes. After 40 minutes I went to assess her vital signs, no side effects or symptoms reported, B/P 123/77, pulse 69. Avelina Laine, NP talked to pt today about her treatment due to Dr. Jennelle Human out of office today. Nurse was with pt a total of 60 minutes but pt observed for 120 minutes per protocol. Discharge vital signs were at 10:02 AM, B/P 103/72, pulse 76. She verbalized understanding. She reports dissociation but she was clear upon her discharge. She is scheduled next week on Thursday, November 21st. She is instructed to contact office if needing anything prior to her next treatment.     LOT # [id number]   EXP FEB 2027

## 2023-03-16 NOTE — Telephone Encounter (Signed)
Ok I sent gabapentin 300 mg cap, 3 at night.  I DC [facility].  Sent clonazepam.

## 2023-03-18 ENCOUNTER — Encounter: Payer: Self-pay | Admitting: Professional Counselor

## 2023-03-18 ENCOUNTER — Ambulatory Visit: Payer: 59 | Admitting: Professional Counselor

## 2023-03-18 DIAGNOSIS — F331 Major depressive disorder, recurrent, moderate: Secondary | ICD-10-CM

## 2023-03-18 DIAGNOSIS — F411 Generalized anxiety disorder: Secondary | ICD-10-CM | POA: Diagnosis not present

## 2023-03-18 NOTE — Progress Notes (Signed)
      [facility] Counselor/Therapist Progress Note  Patient ID: Heather Davila, MRN: [id number],    Date: 03/18/2023  Time Spent: 11:12 AM to 12:10 PM  Treatment Type: Individual Therapy  Reported Symptoms: Sleeplessness, low mood, low self esteem, grief/loss, stress, interpersonal concerns  Mental Status Exam:  Appearance:   Neat     Behavior:  Appropriate and Sharing  Motor:  Normal  Speech/Language:   Clear and Coherent and Normal Rate  Affect:  Appropriate and Congruent  Mood:  normal  Thought process:  normal  Thought content:    WNL  Sensory/Perceptual disturbances:    WNL  Orientation:  Sound  Attention:  Good  Concentration:  Good  Memory:  WNL  Fund of knowledge:   Good  Insight:    Good  Judgment:   Good  Impulse Control:  Good   Risk Assessment: Danger to Self:  No Self-injurious Behavior: No Danger to Others: No Duty to Warn:no Physical Aggression / Violence:No  Access to Firearms a concern: No  Gang Involvement:No   Subjective: Patient presented to session to address concerns of depression and anxiety.  Patient reported progress since last session, having discussed significant stressor of concern with husband, and in working with him to enforce expectations regarding adult son.  As follow-up to this work from prior session, counselor facilitated boundary exploration and identification per worksheets, and provided psychoeducation around boundaries.  Patient and counselor discussed patient responses, and patient voiced helpfulness of intervention.  Counselor also facilitated relationship maintenance worksheets regarding fair fighting and conflict resolution with patient, which patient also voiced as helpful to her needs.  Interventions: Solution-Oriented/Positive Psychology, Humanistic/Existential, and Insight-Oriented, Psychoeducation, Assertiveness Skills Training  Diagnosis:   ICD-10-CM   1. Moderate episode of recurrent major depressive disorder (HCC)   F33.1     2. Generalized anxiety disorder  F41.1       Plan: Patient is scheduled for follow-up; continue process work and developing coping skills.  Patient identified short term goal between sessions of proactively collaborating with adult son to create a budget.  Progress note dictated via Dragon and checked for accuracy.  Gaspar Bidding, [facility]

## 2023-03-19 ENCOUNTER — Other Ambulatory Visit: Payer: Self-pay

## 2023-03-19 ENCOUNTER — Ambulatory Visit: Payer: 59

## 2023-03-19 ENCOUNTER — Ambulatory Visit: Payer: 59 | Admitting: Psychiatry

## 2023-03-19 VITALS — BP 117/77 | HR 76

## 2023-03-19 DIAGNOSIS — F411 Generalized anxiety disorder: Secondary | ICD-10-CM

## 2023-03-19 DIAGNOSIS — F900 Attention-deficit hyperactivity disorder, predominantly inattentive type: Secondary | ICD-10-CM

## 2023-03-19 DIAGNOSIS — F339 Major depressive disorder, recurrent, unspecified: Secondary | ICD-10-CM

## 2023-03-19 DIAGNOSIS — G4700 Insomnia, unspecified: Secondary | ICD-10-CM | POA: Diagnosis not present

## 2023-03-19 MED ORDER — LITHIUM CARBONATE ER 300 MG PO TBCR: 600.0000 mg | EXTENDED_RELEASE_TABLET | Freq: Every evening | ORAL | 0 refills | Status: DC

## 2023-03-19 MED ORDER — CLONAZEPAM 1 MG PO TABS: 1.0000 mg | ORAL_TABLET | Freq: Every day | ORAL | 0 refills | Status: DC

## 2023-03-19 NOTE — Progress Notes (Signed)
NURSES NOTE:           Patient arrived for her #60 Spravato treatment. Pt is coming once a week now. Pt is being treated for Treatment Resistant Depression, pt will be receiving 84 mg (3 of the 28 mg) nasal sprays, this is her maintenance dose. Patient taken to treatment room, I explained and discussed her treatment and how the schedule should go, along with any side effects that may occur. Answered any questions and concerns the patient had. Pt's Spravato is delivered through [place] and she is now billing with her insurance. Spravato medication is stored at doctors office per REMS/FDA guidelines. The medication is required to be locked behind two doors per FDA/REMS Protocol. Medication is also disposed of properly per regulations. All treatments and her vital signs are documented in Spravato REMS per protocol of treatment center regulations. [facility] delivers her medication.      Vital Signs assessed at 8:10 AM 134/80, pulse 65, pulse ox 92%. Instructed pt to blow her nose and recline back slightly to prevent any of medication dripping out of her nose.   Pt. Given 1st dose (28 mg inhaler), then 5 minutes between 2nd dose and 3rd dose for total of 84 mg. No complaints of nausea/vomiting reported. Pt did listen to music today, she just reclined back and closed her eyes. After 40 minutes I went to assess her vital signs, no side effects or symptoms reported, B/P 128/81, pulse 73.  Dr. Jennelle Human talked to pt today about her treatment. Nurse was with pt a total of 60 minutes but pt observed for 120 minutes per protocol. Discharge vital signs were at 10:03 AM, B/P 117/77, pulse 76. She verbalized understanding. She reports dissociation but she was clear upon her discharge. She is not coming in next week due to Thanksgiving Holiday, will return in December . She is instructed to contact office if needing anything prior to her next treatment.     LOT # [id number]   EXP FEB 2027

## 2023-03-19 NOTE — Progress Notes (Signed)
Nature Millet [id number] July 20, 1960 62 y.o.  Subjective:   Patient ID:  Heather Davila is a 62 y.o. (DOB 06-30-1960) female.  Chief Complaint:  Chief Complaint  Patient presents with   Follow-up   Depression   ADD   Fatigue   Sleeping Problem    HPI Heather Davila presents to the office today for follow-up of treatment resistant recurrent depression, generalized anxiety disorder and insomnia.  Almost too numerous to count med failures. She is here for Spravato administration for her treatment resistant depression.  05/21/22 appt noted: Patient was administered first dose Spravato 56 mg intranasally today.  The patient experienced the typical dissociation which gradually resolved over the 2-hour period of observation.  There were no complications.  Specifically the patient did not have nausea or vomiting or headache.   Dissociation was mild. Did not experience any emotional relief from disabling depression.wants to increase Spravato to the usual dose.  She is aware insurance is not covering the costs at this time.  No SI acutely. Tolerating meds.    05/23/22 appt noted: Patient was administered Spravato 84 mg intranasally today.  The patient experienced the typical dissociation which gradually resolved over the 2-hour period of observation.  There were no complications.  Specifically the patient did not have nausea or vomiting.   Dissociation was greater than last dose and a little bothersome DT some HA. Tolerating meds.   Mood has not changed significantly thus far with Spravato.  05/27/22 appt noted: Patient was administered Spravato 84 mg intranasally today.  The patient experienced the typical dissociation which gradually resolved over the 2-hour period of observation.  There were no complications.  Specifically the patient did not have nausea or vomiting or headache. Today she has felt a little relief from the pressing heaviness of depression but a little more anxiety.  During  administration of Spravato she listens to [place] music and was able to relax a little more with the feeling of dissociation that was mildly bothersome. Husband was also in on the session today and asked some questions about IV ketamine versus nasal spray ketamine.  05/29/22 appt noted: Cancelled today DT hypertension  05/30/22 appt noted: Patient was administered Spravato 84 mg intranasally today.  The patient experienced the typical dissociation which gradually resolved over the 2-hour period of observation.  There were no complications.  Specifically the patient did not have nausea or vomiting or headache. She is seeing some improvement in mood with less continuous depression and less intensity.  Hopefulness is better.   No med complaints or SE  06/05/22 appt noted: Patient was administered Spravato 84 mg intranasally today.  The patient experienced the typical dissociation which gradually resolved over the 2-hour period of observation.  There were no complications.  Specifically the patient did not have nausea or vomiting or headache.  Specifically HA is less of a problem with Spravato. No SE with meds. Depression is improving with less intensity.  Intermittent anxiety easily.  More able to enjoy things.  No new concerns.  06/17/22 appt noted: Patient was administered Spravato 84 mg intranasally today.  The patient experienced the typical dissociation which gradually resolved over the 2-hour period of observation.  There were no complications.  Specifically the patient did not have nausea or vomiting or headache.  Specifically HA is less of a problem with Spravato. No SE with meds. Depression is improved 45% with less intensity.  Intermittent anxiety less easily.  More able to enjoy things.  No new concerns.  More  active. Current meds: increased clonazepam to about 1.5 mg HS DT recent insomnia, lexapro 20, Dayvigo 10 mg HS, concerta 36 mg AM, trazodone 100 mg HS.  06/20/22 appt noted: Current  meds: increased clonazepam to about 1.5 mg HS DT recent insomnia, lexapro 20, Dayvigo 10 mg HS, concerta 36 mg AM, trazodone 100 mg HS. Patient was administered Spravato 84 mg intranasally today.  The patient experienced the typical dissociation which gradually resolved over the 2-hour period of observation.  There were no complications.  Specifically the patient did not have nausea or vomiting or headache.  Specifically HA is less of a problem with Spravato. No SE with meds. Depression is improved 45% with less intensity.  Intermittent anxiety less easily.  More able to enjoy things.  No new concerns.  More active.  06/23/22 appt noted:  Current meds: increased clonazepam to about 1.5 mg HS DT recent insomnia, lexapro 20, Dayvigo 10 mg HS, concerta 36 mg AM, trazodone 100 mg HS. Patient was administered Spravato 84 mg intranasally today.  The patient experienced the typical dissociation which gradually resolved over the 2-hour period of observation.  There were no complications.  Specifically the patient did not have nausea or vomiting or headache.  Specifically HA is less of a problem with Spravato. No SE with meds. Intensity of Spravato dissociation varies.  Last week very intesne and this week more typical.  Depression is continuing to improve with better interest and activity and motivation.  Gradual improvement.  Wants to continue.  06/25/22 appt noted: Current meds: increased clonazepam to about 1.5 mg HS DT recent insomnia, lexapro 20, Dayvigo 10 mg HS, concerta 36 mg AM, trazodone 100 mg HS. Patient was administered Spravato 84 mg intranasally today.  The patient experienced the typical dissociation which gradually resolved over the 2-hour period of observation.  There were no complications.  Specifically the patient did not have nausea or vomiting or headache.  Specifically HA is less of a problem with Spravato. No SE with meds. Intensity of Spravato dissociation varies.  No scary and well  tolerated. Continues to gradually improve with Spravato.  She is more motivated and enjoying things more.  H sees progress.  Wants to continue twice weekly until gets maximum response.  Dep is not gone yet.  06/30/22 appt noted:  seen with H Current meds: increased clonazepam to about 1.5 mg HS DT recent insomnia, lexapro 20, Dayvigo 10 mg HS, concerta 36 mg AM, trazodone 100 mg HS. Patient was administered Spravato 84 mg intranasally today.  The patient experienced the typical dissociation which gradually resolved over the 2-hour period of observation.  There were no complications.  Specifically the patient did not have nausea or vomiting or headache.  Specifically HA is less of a problem with Spravato. No SE with meds. Intensity of Spravato dissociation varies.  No scary and well tolerated. Continues to gradually improve with Spravato.  She is more motivated and enjoying things more.  H sees even more progress than she does; more active and smiling.  Wants to continue twice weekly until gets maximum response.  Dep is 80% better.  07/07/22 appt noted: Current meds: increased clonazepam to about 1.5 mg HS DT recent insomnia, lexapro 20, Dayvigo 10 mg HS, concerta 36 mg AM, trazodone 100 mg HS. Patient was administered Spravato 84 mg intranasally today.  The patient experienced the typical dissociation which gradually resolved over the 2-hour period of observation.  There were no complications.  Specifically the patient did not have  nausea or vomiting or headache.  Specifically HA is less of a problem with Spravato. No SE with meds. Intensity of Spravato dissociation varies.  No scary and well tolerated.  Was more intese today. Overall mood is markedly better and please with meds. No changes desired.  07/09/22 appt noted: In transition from Lexapro to sertraline and missing Concerta bc CO crash from it after about 4 hours.  Disc these concerns.  She'll discuss also with Corie Chiquito, NP More dep the  last couple of days and concerned about reducing Spravato frequency while transition from one antidep to another.  Affect less positive. Patient was administered Spravato 84 mg intranasally today.  The patient experienced the typical dissociation which gradually resolved over the 2-hour period of observation.  There were no complications.  Specifically the patient did not have nausea or vomiting or headache.  Specifically HA is less of a problem with Spravato. No SE with meds. Intensity of Spravato dissociation varies.  No scary and well tolerated.   07/14/22 appt noted: Has been more depressed this week.  More trouble with sleep.  Taking clonazepam 1-1.5  of the 0.5 mg tablets.   Trazodone not helping much. She switched back from 200 sertraline to Lexapro 20.  No SE More trouble with motivation.  Some stress with son with special needs. Misses the benevit of Concerta but it was too short acting and crashed in 4-6 hours. Patient was administered Spravato 84 mg intranasally today.  The patient experienced the typical dissociation which gradually resolved over the 2-hour period of observation.  There were no complications.  Specifically the patient did not have nausea or vomiting or headache.  Specifically HA is less of a problem with Spravato. No SE with meds. Intensity of Spravato dissociation varies.  No scary and well tolerated.   07/17/22 appt noted: Patient was administered Spravato 84 mg intranasally today.  The patient experienced the typical dissociation which gradually resolved over the 2-hour period of observation.  There were no complications.  Specifically the patient did not have nausea or vomiting or headache.  Specifically HA is less of a problem with Spravato. No SE with meds. Intensity of Spravato dissociation varies.  Not scary and well tolerated.  Just got Jornay last night but wasn't sure how to take it.  Wants to start and this was discussed.  She felt MPH helped mood and attn but  didn't last long enough and had crash after a few hours on Concerta.  Better for 2 hours after each dose Ritalin 20 mg with mood but then it wore off.  BP up after 3rd dose 160/90 and then took H's amlodipine 5 and BP became normal.  07/22/22 appt noted: Patient was administered Spravato 84 mg intranasally today.  The patient experienced the typical dissociation which gradually resolved over the 2-hour period of observation.  There were no complications.  Specifically the patient did not have nausea or vomiting or headache.  Specifically HA is less of a problem with Spravato. No SE with meds. Intensity of Spravato dissociation varies.  Not scary and well tolerated.  Start Jornay 20 mg over the weekend and did not notice any particular effect good or bad.  Increased to 40 mg. Other psych meds: Clonazepam 0.5 mg tablets 2 nightly, gabapentin dosage varies, Dayvigo 10 mg nightly, Lexapro 20 mg daily, to trazodone 100 mg nightly  07/24/22 appt noted: Patient was administered Spravato 84 mg intranasally today.  The patient experienced the typical dissociation which gradually resolved  over the 2-hour period of observation.  There were no complications.  Specifically the patient did not have nausea or vomiting or headache.  Specifically HA is less of a problem with Spravato. No SE with meds. Intensity of Spravato dissociation varies.  Not scary and well tolerated.  Other psych meds: Clonazepam 0.5 mg tablets 2 nightly, gabapentin dosage varies, Dayvigo 10 mg nightly, Lexapro 20 mg daily, to trazodone 100 mg nightly, Jornay 40 mg PM Doing well with Spravato.  Noticed a little effect from the Jornay 40 without SE but would like to increase the dose for ADD and mood.  Sleeping well.  No new concerns.  Still more depressed than she was with th initial benefit of Spravato.  07/30/22 note:  Patient was administered Spravato 84 mg intranasally today.  The patient experienced the typical dissociation which gradually  resolved over the 2-hour period of observation.  There were no complications.  Specifically the patient did not have nausea or vomiting or headache.  Specifically HA is less of a problem with Spravato. Other psych meds: Clonazepam 0.5 mg tablets 2 nightly, gabapentin dosage varies, Dayvigo 10 mg nightly, Lexapro 20 mg daily, to trazodone 100 mg nightly, Jornay 60 mg PM Wants to increase Jornay to 80 mg PM to increase benefit for ADD and depression. No SE She has experienced a major family crisis that threatens to change her daily life from now on.  It has not triggered suicidal thoughts at this time but she is very afraid.  No desire to change medications.  08/04/22 appt noted" Patient was administered Spravato 84 mg intranasally today.  The patient experienced the typical dissociation which gradually resolved over the 2-hour period of observation.  There were no complications.  Specifically the patient did not have nausea or vomiting or headache.  Specifically HA is less of a problem with Spravato. Other psych meds: Clonazepam 0.5 mg tablets 2 nightly, gabapentin dosage varies, Dayvigo 10 mg nightly, Lexapro 20 mg daily, trazodone 100 mg nightly, Jornay 80 mg PM No SE No benefit or SE with increase Jormay 80 mg.  Says Concerta helped ADD and depression but not Jornay.  However duration was insufficient.  Still depressed and wants change to another form of stimulant.  No concerns with other meds. Continues with strong family stressors creating uncertainty about her future but no quick resolution. No SI Trial Cotempla 17.3 and DC Ritalin & Jornay 80  08/11/2022 appointment noted: Patient was administered Spravato 84 mg intranasally today.  The patient experienced the typical dissociation which gradually resolved over the 2-hour period of observation.  There were no complications.  Specifically the patient did not have nausea or vomiting or headache.  Specifically HA is less of a problem with Spravato now  vs when started it.. Other psych meds: Clonazepam 0.5 mg tablets 2 nightly, gabapentin dosage varies, Dayvigo 10 mg nightly, trazodone 100 mg nightly, cotempla 17.3, switch from Lexapro 20 to sertaline 15o mg . No noticeable effects sertraline from for depression but has seen some antianxiety effects from the sertraline.  No side effects noted.  No side effects with the other medicines.  Still is only getting very brief benefit 3 to 4 hours from Cotempla for ADD and mood.  She wants to try the Daytrana patch as we have discussed before. No SE  08/13/22 appt: Patient was administered Spravato 84 mg intranasally today.  The patient experienced the typical dissociation which gradually resolved over the 2-hour period of observation.  There were no  complications.  Specifically the patient did not have nausea or vomiting or headache.  Specifically HA is less of a problem with Spravato now vs when started it.. Other psych meds: Clonazepam 0.5 mg tablets 2 nightly, gabapentin dosage varies, Dayvigo 10 mg nightly, trazodone 100 mg nightly, cotempla 17.3, switch from Lexapro 20 to sertaline 15o mg . No noticeable effects sertraline from for depression but has seen some antianxiety effects from the sertraline.  No side effects noted.  No side effects with the other medicines.  Still is only getting very brief benefit 3 to 4 hours from Cotempla for ADD and mood.  She wants to try the Daytrana patch as we have discussed before.  Hasn't been able to get it yet. No SE Plan.  Continue to try to get Daytrana 30 AM  08/18/22 appt noted: Patient was administered Spravato 84 mg intranasally today.  The patient experienced the typical dissociation which gradually resolved over the 2-hour period of observation.  There were no complications.  Specifically the patient did not have nausea or vomiting or headache.  Specifically HA is less of a problem with Spravato now vs when started it.. Other psych meds: Clonazepam 0.5 mg  tablets 2 nightly, gabapentin dosage varies, Dayvigo 10 mg nightly, trazodone 100 mg nightly, cotempla 17.3, switch from Lexapro 20 to sertaline 15o mg . No noticeable effects sertraline from for depression but has seen some antianxiety effects from the sertraline.  No side effects noted.  No side effects with the other medicines.  Still is only getting very brief benefit 3 to 4 hours from Cotempla for ADD and mood.  She wants to try the Daytrana patch as we have discussed before.  Hasn't been able to get it yet.  Overall depression is some better than it was.   No SE Plan.  Continue to try to get Daytrana 30 AM  08/25/22 appt : No benefit Daytrana.  No SE.  Wants further med changes and interested In retrying pramipexole.   Tolerating meds. Patient was administered Spravato 84 mg intranasally today.  The patient experienced the typical dissociation which gradually resolved over the 2-hour period of observation.  There were no complications.  Specifically the patient did not have nausea or vomiting or headache.  Specifically HA is less of a problem with Spravato now vs when started it.. Other psych meds: Clonazepam 0.5 mg tablets 2 nightly, gabapentin dosage varies, Dayvigo 10 mg nightly, trazodone 100 mg nightly, , switch from Lexapro 20 to sertaline 15o mg . Daytrana 30 AM for a few days. Struggles with depression and no SI.  Reduced interest and mood and motivation and enjoyment and socialization.  08/27/22 appt noted: Psych meds:  Clonazepam 0.5 mg tablets 2 nightly, gabapentin dosage varies, Dayvigo 10 mg nightly, trazodone 100 mg nightly,  sertaline 15o mg . Returned to [employer] AM, pramipexole  Tolerating meds. Patient was administered Spravato 84 mg intranasally today.  The patient experienced the typical dissociation which gradually resolved over the 2-hour period of observation.  There were no complications.  Specifically the patient did not have nausea or vomiting or headache.   Reports  taking cotempla bc brief mood benefit and sig focus and productivity benefit. Took 1 dose pramipexole and felt more dep.  09/01/22 appt noted: Psych meds:  Clonazepam 0.5 mg tablets 2 nightly, gabapentin dosage varies, Dayvigo 10 mg nightly, trazodone 100 mg nightly,  sertaline 15o mg . Returned to Cotempla AM, pramipexole  0.25 mg BID. Tolerating meds now. Patient  was administered Spravato 84 mg intranasally today.  The patient experienced the typical dissociation which gradually resolved over the 2-hour period of observation.  There were no complications.  Specifically the patient did not have nausea or vomiting or headache.   Willing to try the pramipexole and will continue 0.25 mg BID this week and then consider increase if tolerated.  Mood is a little better.  Still looking for further improvement.  09/03/22 appt noted: Psych meds:  Clonazepam 0.5 mg tablets 2 nightly, gabapentin dosage varies, Dayvigo 10 mg nightly, trazodone 100 mg nightly,  sertaline 15o mg . Returned to Cotempla AM, pramipexole  0.25 mg tablet 1 and 1/2 tablets twice daily BID. Tolerating meds now. Patient was administered Spravato 84 mg intranasally today.  The patient experienced the typical dissociation which gradually resolved over the 2-hour period of observation.  There were no complications.  Specifically the patient did not have nausea or vomiting or headache.   Depression may be a little better with the parmipexole and is tolerating it well so far.  Still struggling with focus and residual depression.  Tolerating meds without SE.  More hopeful and able to enjoy some things.  09/08/22 appt  Psych meds:  Clonazepam 0.5 mg tablets 2 nightly, gabapentin dosage varies, Dayvigo 10 mg nightly, trazodone 100 mg nightly,  sertaline 15o mg . Returned to Cotempla AM, pramipexole  0.5 mg BID. Tolerating meds now. Patient was administered Spravato 84 mg intranasally today.  The patient experienced the typical dissociation which  gradually resolved over the 2-hour period of observation.  There were no complications.  Specifically the patient did not have nausea or vomiting or headache.   She has seen her depression drop from a 10/10 prior to treatment with Spravato to now 4/10 With additional benefit noted when she increased the pramipexole to 0.5 mg twice daily.  She is not having side effects with this like she did in the past. Anxiety levels are also improved. She is sleeping okay with the current medications. Plan: continue pramipexole trial off-label and increase more slowly for TRD.  Increase  Gradually  to 0.75 mg BID  09/11/22 appt noted: Psych meds:  Clonazepam 0.5 mg tablets 2 nightly, gabapentin dosage varies, Dayvigo 10 mg nightly, trazodone 100 mg nightly,  sertaline 15o mg . Returned to Cotempla AM, pramipexole  0.75 mg BID. Tolerating meds now. Patient was administered Spravato 84 mg intranasally today.  The patient experienced the typical dissociation which gradually resolved over the 2-hour period of observation.  There were no complications.  Specifically the patient did not have nausea or vomiting or headache.   She has seen her depression drop from a 10/10 prior to treatment with Spravato to now 4/10 With additional benefit noted when she increased the pramipexole to 0.5 mg twice daily.  She is not having side effects with this like she did in the past. Clearly improving with the pramipexole now and tolerating it.  Satisfied with current meds but would consider increasing if it might be helpful.  09/15/22 appt noted: Psych meds:  Clonazepam 0.5 mg tablets 2 nightly, gabapentin dosage varies, Dayvigo 10 mg nightly, trazodone 100 mg nightly,  sertaline 15o mg . Returned to Cotempla AM, pramipexole  0.75 mg BID too anxious and reduced to 0.5 mg BID. Tolerating meds now. Patient was administered Spravato 84 mg intranasally today.  The patient experienced the typical dissociation which gradually resolved over  the 2-hour period of observation.  There were no complications.  Specifically  the patient did not have nausea or vomiting or headache.   Trouble staying asleep longer than 6 hours.  Doesn't feel she can tolerate pramipexole 0.75 mg BID DT anxiety not experienced at  0.5 mg BID.  Otherwise tolerating meds.  Mood is better with Spravato and pramipexole.   Not sleeping as long with meds.  EMA 6 hours.  Wonders about change Disc concerns about waiting for therapy. Feels ready to reduce Spravato to weekly.    09/25/22 appt noted: Psych meds:  Clonazepam 0.5 mg tablets 2 nightly, gabapentin dosage varies, Dayvigo 10 mg nightly, trazodone 100 mg nightly,  sertaline 15o mg . Returned to Cotempla AM, pramipexole  0.75 mg BID too anxious and reduced to 0.5 mg BID. Tolerating meds now. Patient was administered Spravato 84 mg intranasally today.  The patient experienced the typical dissociation which gradually resolved over the 2-hour period of observation.  There were no complications.  Specifically the patient did not have nausea or vomiting or headache.   Trouble staying asleep longer than 6 hours.  Failed to respond to Seroquel 25 mg HS.  Gone back to [facility].  Belsomra NR. Mood ok until the last few days more dep bc it's been 10 days since last admin.  Wants to continue weekly.   No SE.  No med changes desired.  09/29/22 appt noted: Psych meds:  Clonazepam 0.5 mg tablets 2 nightly, gabapentin dosage varies, Dayvigo 10 mg nightly, trazodone 100 mg nightly,  sertaline 15o mg . Returned to Cotempla AM, pramipexole  0.75 mg BID too anxious and reduced to 0.5 mg BID. Tolerating meds now. Patient was administered Spravato 84 mg intranasally today.  The patient experienced the typical dissociation which gradually resolved over the 2-hour period of observation.  There were no complications.  Specifically the patient did not have nausea or vomiting or headache.   Trouble staying asleep longer than 6 hours.  Plan:  Continue sertaline to 200 mg daily.  It has helped anxiety but not depression.   for insomnia, clonazepam 1 mg HS. trazodone doesn't work well but helps some Continue Dayvigo 10 HS.  Failed resp Belsomra 15 and seroquel 25 She wants to continue Cotempla or Concerta bc helps mood early in day and focus  benefit lasts longer.  Have not been able to get stimulant to be consitently effective throughout the day. continue pramipexole trial off-label and reduce back to  TRD 0.5 mg BID Spravato weekly.  10/13/22 appt noted: Not sleeping as well.  Wants to increase Spravato to twice weekly bc feeling more dep close to the next sched date.  Just the day or 2 before.  .  Plan: Trial for off label TRD increase pramipexole 1 mg TID  10/20/22 appt noted: Psych meds:  Clonazepam 0.5 mg tablets 2 nightly, gabapentin dosage varies, Dayvigo 10 mg nightly, trazodone 100 mg nightly,  sertaline 15o mg . Returned to Cotempla AM, pramipexole  1 mg TID. Tolerating meds now. Patient was administered Spravato 84 mg intranasally today.  The patient experienced the typical dissociation which gradually resolved over the 2-hour period of observation.  There were no complications.  Specifically the patient did not have nausea or vomiting or headache.   Trouble staying asleep longer than 6 hours.  More dep this week and asked to get Spravato twice this week. Plan: Trial for off label TRD increase pramipexole 1 mg QID  10/22/22 appt noted:  Psych meds:  Clonazepam 0.5 mg tablets 2 nightly and another one with  EMA, gabapentin dosage varies, Belsomra 20 mg daily,  trazodone 100 mg nightly,  sertaline 15o mg . Returned to Cotempla AM, pramipexole  1 mg QID for 2nd day Tolerating meds now.  Except a little N and dizzy briefly after takes it. Patient was administered Spravato 84 mg intranasally today.  The patient experienced the typical dissociation which gradually resolved over the 2-hour period of observation.  There were no  complications.  Specifically the patient did not have nausea or vomiting or headache. No change in mood yet.   Sleep better with Belsomra 20 as long as takes with other meds.  If insomnia mood is worse. Ongoing some dep but gets partial relief with Spravato but would like better relief .  10/27/22 received Spravato 84  11/03/22 appt noted:  Meds as above except sertraline she cut to 100 mg instead of 200 mg daily She doesn't think sertraline helping and worries over poss SE Off and on Cotempla .  Not affecting BP. Doesn't notice benefit or SE with pramipexole 1 mg QID.  No SE Sleep is OK.  Some awakening aftter 6 hours but not enough so takes another clonazepam.   Patient was administered Spravato 84 mg intranasally today.  The patient experienced the typical dissociation which gradually resolved over the 2-hour period of observation.  There were no complications.  Specifically the patient did not have nausea or vomiting or headache. No change in mood yet.   Sleep better with Belsomra 20 as long as takes with other meds.  If insomnia mood is worse. Ongoing some dep but gets partial relief with Spravato but would like better relief .  No sig benefit with pramipexole 1 mg QID.  11/21/22 TC:  To ER with dep 11/21/22. No SE with selegiline but feels more dep.  Wants to stop it. Today is only day 2 of 1 tablet of selegiline 5 mg AM.  Reviewed with her that she had not been satisfied with the response of Spravato and Zoloft and that she is likely feeling worse because the Zoloft is wearing off and in particular because she abruptly stopped 150 mg daily.  She needs to give the selegiline time to work.  She agrees to do so.  She admitted to some passive suicidal thoughts but no active suicidal thoughts and no desire to die.  She agrees to the plan to increase selegiline to 10 mg every morning this weekend and to continue the selegiline trial.  She cannot stop this and immediately start another AD due to risk  DDI with serotonin syndrome.  She is aware.  Meredith Staggers, MD, DFAPA  12/19/22 TC: Pt called at 1:42p.  She said she wanted to know if she could start weaning off the Selegiline.  She thinks increasing the dosage is making things worse.  She said she wants to go back to [employer].  She acknowleged she has an appt on Monday. I advised her that I wasn't sure Dr Jennelle Human will see her message before her appt, but she still ask me to send the message.  Next appt 8/26 MD resp.  No change until Monday appt.  12/22/22 appt noted: Meds: selegiline up to 5 mg 4 daily for a week but felt it incr irritability and went back to 3 mg daily. She wants to go back to Spravato.  Not near as dep as now.  Selegiline didn't help energy. Still on Belsomra Thinks maybe sertraline helped mood more than she thought. More dep than anxiety.  Plan: DC selegiline and will retry Effexor which helped in the past at 300 mg daily.  Start this Friday after over 10 half lives of selegiline.    12/24/22 appt noted: Stopped selegiline  Psych med: belsomra 20, clonazepam 1 mg HS.   She has felt more dep off the Spravato though at the time didn't think it was helping.  Has felt more down and withdrawn.  Low energy, motivation, not smiling.  No SI.  Sleep ok.   Patient was administered Spravato 84 mg intranasally today.  The patient experienced the typical dissociation which gradually resolved over the 2-hour period of observation.  There were no complications.  Specifically the patient did not have nausea or vomiting or headache. No problems with BP or other unusual SE Mood is a little better after this administration of Spravato.  Very motivated to continue it.  No immediate med concerns. No SI .  Ongoing anhedonia.  12/25/22 appt noted: Psych med: belsomra 20, clonazepam 1 mg HS.  Not resumed stimulant or AD yet   No SE. Patient was administered Spravato 84 mg intranasally today.  The patient experienced the typical dissociation  which gradually resolved over the 2-hour period of observation.  There were no complications.  Specifically the patient did not have nausea or vomiting or headache.  There is a lot things going on in her life and it is somewhat difficult to separate stressors from what is going on with her mood.  However she is happy to resume Spravato as she decided after stopping briefly that it was helpful.  She plans to start the antidepressant Morrow.  She also wants to resume the stimulant tomorrow.  12/30/22 appt noted; Psych med: belsomra 20, clonazepam 1 mg HS.  Not resumed stimulant.  Effexor XR 37.5 mg daily   No SE. Patient was administered Spravato 84 mg intranasally today.  The patient experienced the typical dissociation which gradually resolved over the 2-hour period of observation.  There were no complications.  Specifically the patient did not have nausea or vomiting or headache.  She is still having depression with very prominent anhedonia.  However she feels the Spravato is somewhat helpful and is hopeful for more complete response as she gets back onto a reasonable antidepressant dose.  She tolerated Effexor XR 300 mg daily in the past and took it for 5 months and it is probably the antidepressants worked best for her.  She is agreeable with this plan.  She is getting sleep benefit from the current medications Plan: incr Effexor 75 mg daily  01/02/23 appt noted: Psych meds as noted above except increase Effexor XR to 75 mg daily without side effects. Patient was administered Spravato 84 mg intranasally today.  The patient experienced the typical dissociation which gradually resolved over the 2-hour period of observation.  There were no complications.  Specifically the patient did not have nausea or vomiting or headache.  Agrees to increasing Effexor as planned.  SX not changed from that noted above.  No SI  01/05/23 appt noted: Psych meds as noted above except increase Effexor XR to 112.5 mg daily  without side effects. Patient was administered Spravato 84 mg intranasally today.  The patient experienced the typical dissociation which gradually resolved over the 2-hour period of observation.  There were no complications.  Specifically the patient did not have nausea or vomiting or headache.  Agrees to increasing Effexor as planned gradually to 300 mg daily.  SX not changed from that noted above.  Does however feel Spravato has dampened the degree of depression.  No SI Stressful life event discussed and won't be resolved until early next year.  01/07/23 appt noted: Psych meds Effexor XR 112.5 mg daily. Patient was administered Spravato 84 mg intranasally today.  The patient experienced the typical dissociation which gradually resolved over the 2-hour period of observation.  There were no complications.  Specifically the patient did not have nausea or vomiting or headache.  Agrees to increasing Effexor as planned gradually to 300 mg daily.  SX not changed from that noted above.  Does however feel Spravato has dampened the degree of depression markedly compared to when off psych meds and no Spravato.  No SI  01/12/23 appt noted; Psych meds Effexor XR 225 mg daily. No SE notedl. Patient was administered Spravato 84 mg intranasally today.  The patient experienced the typical dissociation which gradually resolved over the 2-hour period of observation.  There were no complications.  Specifically the patient did not have nausea or vomiting.  But is having headache.  Agrees to increasing Effexor as planned gradually to 300 mg daily.  SX not changed from that noted above.  Does however feel Spravato has dampened the degree of depression markedly compared to when off psych meds and no Spravato.  No SI  01/15/23 appt noted:  Psych meds Effexor XR 225 mg daily. No SE noted. Patient was administered Spravato 84 mg intranasally today.  The patient experienced the typical dissociation which gradually resolved  over the 2-hour period of observation.  There were no complications.  Specifically the patient did not have nausea or vomiting.   Doing ok with higher doses of Effexor and will plan to increase to 300 mg daily. Mild improvement noticed so far with the incrase in meds.   Uses Cotempla prn and it helps but doesn't liek the way she feels when it wears off.  01/22/23 appt noted: Psych meds reduced Effexor XR 150 mg daily. Bc of HA No SE noted. Patient was administered Spravato 84 mg intranasally today.  The patient experienced the typical dissociation which gradually resolved over the 2-hour period of observation.  There were no complications.  Specifically the patient did not have nausea or vomiting.  Ongoing moderate levels of depression with some improvement from the Spravato.  She is not sure about the effect of the venlafaxine on her mood yet.  She would like to continue to try pursuing a stimulant but has had difficulty tolerating p.o. versions of stimulants because of the crash at the end and side effects with many of them.  She has several questions about this.  01/27/23 appt noted: Psych meds: Clonazepam 1 to 1.5 mg nightly, lithium CR 300 nightly, Belsomra 20 nightly, venlafaxine XR 150 every morning No side effects noted at the current dosage. Patient was administered Spravato 84 mg intranasally today.  The patient experienced the typical dissociation which gradually resolved over the 2-hour period of observation.  There were no complications.  Specifically the patient did not have nausea or vomiting.  Ongoing moderate levels of depression with some improvement from the Spravato.   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  She does not tolerate oral stimulants very well because she has a crash as they wear off which is very dysphoric.  She is willing to retry the Daytrana patches that she took in the spring.  She took 1 patch on  1 occasion recently and  felt it was helpful. However she is also having problems with early morning awakening over the last week without precipitant.  She would like to have a different treatment strategy for sleep but understands her sleep problems have historically been difficult to resolve and maintain.  02/10/23 appt noted: Psych meds: Clonazepam 1 to 1.5 mg nightly, lithium CR 300 nightly, Belsomra 20 nightly, venlafaxine XR 150 every morning, Daytrana 30 mg Am No side effects noted at the current dosage. Patient was administered Spravato 84 mg intranasally today.  The patient experienced the typical dissociation which gradually resolved over the 2-hour period of observation.  There were no complications.  Specifically the patient did not have nausea or vomiting.  Ongoing moderate levels of depression with some improvement from the Spravato.  Particularly anhedonia.  No sig benefit noted with Daytrana and problems with table stimulants bc gets brief benefit with unpleasant "crash".  Wants to try another type of patch if available.  02/17/23 appt noted: Psych meds: Clonazepam 1 to 1.5 mg nightly, lithium CR 300 nightly, Belsomra 20 nightly, venlafaxine XR 225 mg every morning, Daytrana 30 mg Am No side effects noted at the current dosage. Patient was administered Spravato 84 mg intranasally today.  The patient experienced the typical dissociation which gradually resolved over the 2-hour period of observation.  There were no complications.  Specifically the patient did not have nausea or vomiting.  Ongoing moderate levels of depression with some improvement from the Spravato.  Particularly anhedonia.  She has not gotten the new stimulant yet but is hopeful. No problems with increasing venlafaxine.   Mood is better with Spravato and venlafaxine.  Less anhedonia, less down. Themazepam not helpful.    03/04/23 received Spravato 84 03/12/23 received Spravato 84  03/19/23 appt noted: Psych meds:  Clonazepam 1 to 1.5 mg nightly, lithium CR 300 nightly, Belsomra 20 nightly, venlafaxine XR 300 mg every morning, tried various stimulants lately No side effects noted at the current dosage. Patient was administered Spravato 84 mg intranasally today.  The patient experienced the typical dissociation which gradually resolved over the 2-hour period of observation.  There were no complications.  Specifically the patient did not have nausea or vomiting.  Not getting good duration from oral stimulants.  She would like to try another patch but has to take tabs first and try them before insurance will cover patches.  Overall dep is better with meds and Spravato.  Clearly sees benefit.   AIMS    Flowsheet Row Video Visit from 01/03/2022 in [facility]  AIMS Total Score 0      ECT-MADRS    Flowsheet Row Clinical Support from 08/13/2022 in [facility] Video Visit from 04/29/2022 in [facility] Video Visit from 02/06/2022 in [facility]  MADRS Total Score 27 44 23      GAD-7    Flowsheet Row Counselor from 12/19/2022 in [facility] Office Visit from 08/22/2022 in [facility] [place]  Total GAD-7 Score 4 7      PHQ2-9    Flowsheet Row Counselor from 12/19/2022 in [facility] Office Visit from 08/22/2022 in [facility] [place] Office Visit from 08/19/2022 in [facility] at [facility] Video Visit from 02/06/2022 in [facility] Office Visit from 11/16/2020 in [facility] at [facility] Total  Score 2 3 0 6 0  PHQ-9 Total Score 5 5 -- 14 --        Past Psychiatric Medication Trials: Trintellix - Initially effective and then was not effective when re-started Paxil- Ineffective.  Helped initially. Prozac-Insomnia Lexapro- Effective and then no longer as effective Zoloft- Initially effective and then minimal response Viibryd Cymbalta- Ineffective. Helped initially. Effexor XR 300- Effective, well tolerated. Pristiq- Increased anxiety at 150 mg daily Wellbutrin XL-Ineffective.May have increased anxiety. Amitriptyline Nortriptyline-Caused irritability, constipation Auvelity- ineffective Selegilne brief 20 mg daily , more irritable  Abilify- Helpful but caused severe insomnia. Rexulti-Helpful but caused insomnia. Vraylar Seroquel - Ineffective Risperdal Olanzapine- Ineffective Latuda- Ineffective Caplyta  Klonopin- Effective Temazepam- Took during menopause Lunesta- Ineffective Ambien-Ineffective Sonata- Ineffective Dayvigo- partially effective  Belsomra 20 worked and then failed  Trazodone-  somewhat effective Remeron-Ineffective. Does not recall wt gain.  Quetiapine 25 NR  Adderall- Took once and had adverse effect. Concerta- Effective but only 4-6 hours Jornay 80 NR Metadate-Not as effective as Concerta Dexmethylphenidate ER 10/02/21- Not as effective as Concerta Azstarys- some side effects. Not as effective.  Ritalin short acting and SE anxiety & HTN @20  TID Cotempla 17.3  Daytrana NR  Lamictal- May have had an adverse effects. "Felt weird." Reports worsening s/s.  Lithium ? effect Gabapentin Pramipexole- 1 mg QID NR  ECT #20 with minimal response. H says it was partly helpful.  AIMS    Flowsheet Row Video Visit from 01/03/2022 in [facility]  AIMS Total Score 0      ECT-MADRS    Flowsheet Row Clinical Support from 08/13/2022 in [facility] Video Visit from 04/29/2022 in [facility] Video Visit from 02/06/2022 in [facility]  MADRS Total Score 27 44 23      GAD-7    Flowsheet Row Counselor from 12/19/2022 in [facility] Office Visit from 08/22/2022 in [facility] [place]  Total GAD-7 Score 4 7      PHQ2-9    Flowsheet Row Counselor from 12/19/2022 in [facility] Office Visit from 08/22/2022 in [facility] [place] Office Visit from 08/19/2022 in [facility] at [facility] Video Visit from 02/06/2022 in [facility] Office Visit from 11/16/2020 in [facility] at [facility] Total Score 2 3 0 6 0  PHQ-9 Total Score 5 5 -- 14 --        Review of Systems:  Review of Systems  Constitutional:  Positive for fatigue.  Cardiovascular:  Negative for chest pain and palpitations.  Neurological:  Negative for dizziness, tremors and headaches.  Psychiatric/Behavioral:  Positive for dysphoric mood and sleep disturbance. Negative for agitation, decreased concentration and suicidal ideas. The patient is nervous/anxious.     Medications: I have reviewed the patient's current medications.  Current Outpatient Medications  Medication Sig Dispense Refill   clonazePAM (KLONOPIN) 1 MG tablet Take 1-2 tablets (1-2 mg total) by mouth at bedtime. 60 tablet 0   Dexmethylphenidate HCl 40 MG CP24 Take 1 capsule (40 mg total) by mouth every morning. 15 capsule 0   Esketamine HCl, 84 MG Dose, (SPRAVATO, 84 MG DOSE,) 28 MG/DEVICE SOPK Place 84 mg into the nose once a week. 3 each 1   Estradiol (VAGIFEM) 10 MCG TABS vaginal tablet Place 1 tablet (10 mcg total) vaginally 2 (  two) times a week. 24 tablet 3   gabapentin (NEURONTIN) 100 MG capsule Take 2 capsules (200 mg) by mouth for mid nocturnal awakening. 60 capsule 2   gabapentin (NEURONTIN) 300 MG capsule Take 3 capsules (900 mg total) by mouth at bedtime. 90 capsule 0   lithium carbonate (LITHOBID) 300 MG ER tablet Take 2 tablets (600 mg total) by mouth at bedtime. 180 tablet  0   Suvorexant (BELSOMRA) 20 MG TABS Take 1 tablet (20 mg total) by mouth at bedtime. 30 tablet 0   valACYclovir (VALTREX) 500 MG tablet Take one twice daily for 3 days with any symptoms 30 tablet 3   valsartan (DIOVAN) 80 MG tablet Take 1 tablet (80 mg total) by mouth daily. 90 tablet 3   venlafaxine XR (EFFEXOR-XR) 150 MG 24 hr capsule Take 1 capsule (150 mg total) by mouth 2 (two) times daily. 60 capsule 0   Dextroamphetamine (XELSTRYM) 13.5 MG/9HR PTCH Place 1 patch onto the skin every morning. (Patient not taking: Reported on 02/23/2023) 30 patch 0   No current facility-administered medications for this visit.    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   TRANSAMINASES, SERUM, ELEVATED 08/14/2009    Past Medical History, Surgical history, Social history, and Family history were reviewed and updated as appropriate.   Please see review of systems for further details on the patient's review from today.   Objective:   Physical Exam:  LMP 12/27/2007 (Exact Date)   Physical Exam Constitutional:      General: She is not in acute distress. Musculoskeletal:        General: No deformity.  Neurological:     Mental Status: She is alert and oriented to person, place, and time.     Coordination: Coordination normal.  Psychiatric:        Attention and Perception: Perception normal. She is inattentive. She does not perceive auditory hallucinations.        Mood and Affect: Mood is anxious and depressed. Affect is blunt. Affect is not tearful.        Speech: Speech normal.        Behavior: Behavior normal.        Thought Content: Thought  content normal. Thought content is not delusional. Thought content does not include homicidal or suicidal ideation. Thought content does not include suicidal plan.        Cognition and Memory: Cognition and memory normal.        Judgment: Judgment normal.     Comments: Insight intact Mood still mod dep but better with Spravato.  She is doing better overall. Affect blunted but with a little more expression      Lab Review:     Component Value Date/Time   NA 132 (L) 10/27/2022 1107   K 4.4 10/27/2022 1107   CL 95 (L) 10/27/2022 1107   CO2 30 10/27/2022 1107   GLUCOSE 89 10/27/2022 1107   BUN 14 10/27/2022 1107   CREATININE 0.67 10/27/2022 1107   CREATININE 0.68 01/26/2020 0920   CALCIUM 10.1 10/27/2022 1107   PROT  8.3 04/22/2022 0932   ALBUMIN 5.3 (H) 04/22/2022 0932   AST 82 (H) 04/22/2022 0932   ALT 118 (H) 04/22/2022 0932   ALKPHOS 74 04/22/2022 0932   BILITOT 0.3 04/22/2022 0932   GFRNONAA >90 05/01/2014 1645   GFRAA >90 05/01/2014 1645       Component Value Date/Time   WBC 3.3 (L) 04/22/2022 0932   RBC 4.25 04/22/2022 0932   HGB 14.3 04/22/2022 0932   HGB 14.1 10/26/2014 1034   HCT 40.7 04/22/2022 0932   PLT 245.0 04/22/2022 0932   MCV 95.7 04/22/2022 0932   MCH 34.4 (H) 01/26/2020 0920   MCHC 35.1 04/22/2022 0932   RDW 13.2 04/22/2022 0932   LYMPHSABS 0.9 04/22/2022 0932   MONOABS 0.2 04/22/2022 0932   EOSABS 0.1 04/22/2022 0932   BASOSABS 0.0 04/22/2022 0932    No results found for: "POCLITH", "LITHIUM"   No results found for: "PHENYTOIN", "PHENOBARB", "VALPROATE", "CBMZ"   .res Assessment: Plan:    Loney was seen today for follow-up, depression, add, fatigue and sleeping problem.  Diagnoses and all orders for this visit:  Recurrent major depression resistant to treatment (HCC)  Generalized anxiety disorder  Attention deficit hyperactivity disorder (ADHD), predominantly inattentive type  Insomnia, unspecified type   Pt seen for TRD with  multiple failed medications as noted above.    Mood is better with venlafaxine XR 300 with Spravato but ongoing dep with anhedonia and fatigue  Patient was administered Spravato 84 mg intranasally today.  The patient experienced the typical dissociation which gradually resolved over the 2-hour period of observation.  There were no complications.  Specifically the patient did not have nausea or vomiting or headache.  Blood pressures remained within normal ranges at the 40-minute and 2-hour follow-up intervals.  By the time the 2-hour observation period was met the patient was alert and oriented and able to exit without assistance.  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  See nursing note for further details.  Consider switch Spravato to MAOI, Parnate  We discussed the short-term risks associated with benzodiazepines including sedation and increased fall risk among others.  Discussed long-term side effect risk including dependence, potential withdrawal symptoms, and the potential eventual dose-related risk of dementia.  But recent studies from 2020 dispute this association between benzodiazepines and dementia risk. Newer studies in 2020 do not support an association with dementia.  for insomnia, wants to continue clonazepam 1.5 -2 mg nightly.  She wants to continue Belsomra partially effective.  Cotempla or Concerta bc helps mood early in day and focus  benefit lasts longer.  Have not been able to get stimulant to be consitently effective throughout the day. Conisder switching to patch bc pt has significant crash with stimulants or perhaps [place].  Stimulant tablets cause crash with inadequate duration .  Patches should provide smoother response.  Will try another patch, Noemi Chapel if we can get it approved.   Insurance denied PA for this medication stating she has to try dextroamphetamine ER, Adderall XR, and  Vyvanse before will be considered. Dexmethylphenidate ER 10/02/21- Not as effective as Concerta Have been to try Adderall XR because she did not tolerate Adderall at home I am reluctant to prescribe Vyvanse for the same reason but we can try a low dose of Vyvanse in hopes of avoiding side effects she had with Adderall.  Discussed potential benefits, risks, and side effects of stimulants with patient to  include increased heart rate, palpitations, insomnia, increased anxiety, increased irritability, or decreased appetite.  Instructed patient to contact office if experiencing any significant tolerability issues.   increased venlafaxine XR to 300 mg daily.    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  Disc pretreating Spravato with NSAID or Tylenol bc of HA lately.  Supportive therapy dealing with serious stressful life event.  FU twice weekly Spravato until next week then try to drop back to weekly .  Meredith Staggers, MD, DFAPA    Please see After Visit Summary for patient specific instructions.  Future Appointments  Date Time Provider Department Center  03/30/2023  9:00 AM Cottle, Steva Ready., MD [facility] None  03/30/2023  9:00 AM CP-NURSE [facility] None  04/02/2023  9:00 AM Gaspar Bidding, [facility] None  04/06/2023  9:00 AM Gaspar Bidding, [facility] None  05/01/2023  9:00 AM Gaspar Bidding, [facility] CP-CP None  05/15/2023  9:00 AM Gaspar Bidding, [facility] CP-CP None  05/29/2023  9:00 AM Gaspar Bidding, [facility] CP[facility] None  06/09/2023  9:00 AM Gaspar Bidding, [facility] CP[facility]CP None  06/23/2023  9:00 AM Gaspar Bidding, [facility] CP-CP None  09/18/2023  8:15 AM Jerene Bears, MD [facility]    No orders of the defined types were placed in this encounter.   -------------------------------

## 2023-03-23 ENCOUNTER — Other Ambulatory Visit: Payer: Self-pay | Admitting: Psychiatry

## 2023-03-23 ENCOUNTER — Encounter: Payer: 59 | Admitting: Psychiatry

## 2023-03-23 DIAGNOSIS — F339 Major depressive disorder, recurrent, unspecified: Secondary | ICD-10-CM

## 2023-03-23 MED ORDER — SPRAVATO (84 MG DOSE) 28 MG/DEVICE NA SOPK: 84.0000 mg | PACK | NASAL | 1 refills | Status: DC

## 2023-03-23 MED ORDER — GABAPENTIN 100 MG PO CAPS: ORAL_CAPSULE | ORAL | 2 refills | Status: DC

## 2023-03-24 ENCOUNTER — Ambulatory Visit: Payer: 59 | Admitting: Professional Counselor

## 2023-03-24 ENCOUNTER — Encounter: Payer: Self-pay | Admitting: Psychiatry

## 2023-03-30 ENCOUNTER — Ambulatory Visit: Payer: 59 | Admitting: Psychiatry

## 2023-03-30 ENCOUNTER — Ambulatory Visit: Payer: 59

## 2023-03-30 VITALS — BP 120/82 | HR 73

## 2023-03-30 DIAGNOSIS — F900 Attention-deficit hyperactivity disorder, predominantly inattentive type: Secondary | ICD-10-CM | POA: Diagnosis not present

## 2023-03-30 DIAGNOSIS — F411 Generalized anxiety disorder: Secondary | ICD-10-CM | POA: Diagnosis not present

## 2023-03-30 DIAGNOSIS — G4700 Insomnia, unspecified: Secondary | ICD-10-CM

## 2023-03-30 DIAGNOSIS — F339 Major depressive disorder, recurrent, unspecified: Secondary | ICD-10-CM | POA: Diagnosis not present

## 2023-03-30 NOTE — Progress Notes (Signed)
NURSES NOTE:           Patient arrived for her #57 Spravato treatment. Pt is coming every other week now. Pt is being treated for Treatment Resistant Depression, pt will be receiving 84 mg (3 of the 28 mg) nasal sprays, this is her maintenance dose. Patient taken to treatment room, I explained and discussed her treatment and how the schedule should go, along with any side effects that may occur. Answered any questions and concerns the patient had. Pt's Spravato is delivered through [place] and she is now billing with her insurance. Spravato medication is stored at doctors office per REMS/FDA guidelines. The medication is required to be locked behind two doors per FDA/REMS Protocol. Medication is also disposed of properly per regulations. All treatments and her vital signs are documented in Spravato REMS per protocol of treatment center regulations. [facility] delivers her medication.      Vital Signs assessed at 8:05 AM 123/73, pulse 69, pulse ox 92%. Instructed pt to blow her nose and recline back slightly to prevent any of medication dripping out of her nose.   Pt. Given 1st dose (28 mg inhaler), then 5 minutes between 2nd dose and 3rd dose for total of 84 mg. No complaints of nausea/vomiting reported. Pt did listen to music today, she just reclined back and closed her eyes. After 40 minutes I went to assess her vital signs, no side effects or symptoms reported, B/P 127/76, pulse 68.  Dr. Jennelle Human talked to pt today about her treatment. Nurse was with pt a total of 60 minutes but pt observed for 120 minutes per protocol. Discharge vital signs were at 10:05 AM, B/P 120/82, pulse 73. She verbalized understanding. She reports dissociation but she was clear upon her discharge. She is coming next on December 17th . She is instructed to contact office if needing anything prior to her next treatment.     LOT # [id number]   EXP APR 2027

## 2023-04-01 ENCOUNTER — Telehealth: Payer: Self-pay

## 2023-04-01 NOTE — Telephone Encounter (Signed)
Pt contacted nurse and reports since the increase in her Gabapentin for insomnia she is having worsening depression symptoms and does not want to continue to take anymore. Asking Dr. Jennelle Human to prescribe something else for her sleep instead.    Secondly she was asking about the [facility] patch status, which I am currently working on.

## 2023-04-01 NOTE — Telephone Encounter (Signed)
We've tried 11 different sleep meds.  There's nothing I know to RX at this time.  I'll discuss with her at appt.  If gabapentin helps a little then try reducing the dose to get rid of dep.  If no help then stop it.

## 2023-04-02 ENCOUNTER — Ambulatory Visit (INDEPENDENT_AMBULATORY_CARE_PROVIDER_SITE_OTHER): Payer: 59 | Admitting: Professional Counselor

## 2023-04-02 NOTE — Telephone Encounter (Signed)
Pt also agreed to do that as well. She retried Dayvigo last night but has a headache today since waking up. She will be scheduled in 2 weeks for next treatment.

## 2023-04-04 ENCOUNTER — Encounter: Payer: Self-pay | Admitting: Psychiatry

## 2023-04-04 MED ORDER — TRAZODONE HCL 50 MG PO TABS: 50.0000 mg | ORAL_TABLET | Freq: Every day | ORAL | 0 refills | Status: DC

## 2023-04-04 NOTE — Progress Notes (Signed)
Heather Davila [id number] 1961-01-11 62 y.o.  Subjective:   Patient ID:  Heather Davila is a 62 y.o. (DOB 10-03-1960) female.  Chief Complaint:  Chief Complaint  Patient presents with   Follow-up   Depression   Anxiety   ADD   Sleeping Problem    HPI [name] presents to the office today for follow-up of treatment resistant recurrent depression, generalized anxiety disorder and insomnia.  Almost too numerous to count med failures. She is here for Spravato administration for her treatment resistant depression.  05/21/22 appt noted: Patient was administered first dose Spravato 56 mg intranasally today.  The patient experienced the typical dissociation which gradually resolved over the 2-hour period of observation.  There were no complications.  Specifically the patient did not have nausea or vomiting or headache.   Dissociation was mild. Did not experience any emotional relief from disabling depression.wants to increase Spravato to the usual dose.  She is aware insurance is not covering the costs at this time.  No SI acutely. Tolerating meds.    05/23/22 appt noted: Patient was administered Spravato 84 mg intranasally today.  The patient experienced the typical dissociation which gradually resolved over the 2-hour period of observation.  There were no complications.  Specifically the patient did not have nausea or vomiting.   Dissociation was greater than last dose and a little bothersome DT some HA. Tolerating meds.   Mood has not changed significantly thus far with Spravato.  05/27/22 appt noted: Patient was administered Spravato 84 mg intranasally today.  The patient experienced the typical dissociation which gradually resolved over the 2-hour period of observation.  There were no complications.  Specifically the patient did not have nausea or vomiting or headache. Today she has felt a little relief from the pressing heaviness of depression but a little more anxiety.  During  administration of Spravato she listens to [place] music and was able to relax a little more with the feeling of dissociation that was mildly bothersome. Husband was also in on the session today and asked some questions about IV ketamine versus nasal spray ketamine.  05/29/22 appt noted: Cancelled today DT hypertension  05/30/22 appt noted: Patient was administered Spravato 84 mg intranasally today.  The patient experienced the typical dissociation which gradually resolved over the 2-hour period of observation.  There were no complications.  Specifically the patient did not have nausea or vomiting or headache. She is seeing some improvement in mood with less continuous depression and less intensity.  Hopefulness is better.   No med complaints or SE  06/05/22 appt noted: Patient was administered Spravato 84 mg intranasally today.  The patient experienced the typical dissociation which gradually resolved over the 2-hour period of observation.  There were no complications.  Specifically the patient did not have nausea or vomiting or headache.  Specifically HA is less of a problem with Spravato. No SE with meds. Depression is improving with less intensity.  Intermittent anxiety easily.  More able to enjoy things.  No new concerns.  06/17/22 appt noted: Patient was administered Spravato 84 mg intranasally today.  The patient experienced the typical dissociation which gradually resolved over the 2-hour period of observation.  There were no complications.  Specifically the patient did not have nausea or vomiting or headache.  Specifically HA is less of a problem with Spravato. No SE with meds. Depression is improved 45% with less intensity.  Intermittent anxiety less easily.  More able to enjoy things.  No new concerns.  More  active. Current meds: increased clonazepam to about 1.5 mg HS DT recent insomnia, lexapro 20, Dayvigo 10 mg HS, concerta 36 mg AM, trazodone 100 mg HS.  06/20/22 appt noted: Current  meds: increased clonazepam to about 1.5 mg HS DT recent insomnia, lexapro 20, Dayvigo 10 mg HS, concerta 36 mg AM, trazodone 100 mg HS. Patient was administered Spravato 84 mg intranasally today.  The patient experienced the typical dissociation which gradually resolved over the 2-hour period of observation.  There were no complications.  Specifically the patient did not have nausea or vomiting or headache.  Specifically HA is less of a problem with Spravato. No SE with meds. Depression is improved 45% with less intensity.  Intermittent anxiety less easily.  More able to enjoy things.  No new concerns.  More active.  06/23/22 appt noted:  Current meds: increased clonazepam to about 1.5 mg HS DT recent insomnia, lexapro 20, Dayvigo 10 mg HS, concerta 36 mg AM, trazodone 100 mg HS. Patient was administered Spravato 84 mg intranasally today.  The patient experienced the typical dissociation which gradually resolved over the 2-hour period of observation.  There were no complications.  Specifically the patient did not have nausea or vomiting or headache.  Specifically HA is less of a problem with Spravato. No SE with meds. Intensity of Spravato dissociation varies.  Last week very intesne and this week more typical.  Depression is continuing to improve with better interest and activity and motivation.  Gradual improvement.  Wants to continue.  06/25/22 appt noted: Current meds: increased clonazepam to about 1.5 mg HS DT recent insomnia, lexapro 20, Dayvigo 10 mg HS, concerta 36 mg AM, trazodone 100 mg HS. Patient was administered Spravato 84 mg intranasally today.  The patient experienced the typical dissociation which gradually resolved over the 2-hour period of observation.  There were no complications.  Specifically the patient did not have nausea or vomiting or headache.  Specifically HA is less of a problem with Spravato. No SE with meds. Intensity of Spravato dissociation varies.  No scary and well  tolerated. Continues to gradually improve with Spravato.  She is more motivated and enjoying things more.  H sees progress.  Wants to continue twice weekly until gets maximum response.  Dep is not gone yet.  06/30/22 appt noted:  seen with H Current meds: increased clonazepam to about 1.5 mg HS DT recent insomnia, lexapro 20, Dayvigo 10 mg HS, concerta 36 mg AM, trazodone 100 mg HS. Patient was administered Spravato 84 mg intranasally today.  The patient experienced the typical dissociation which gradually resolved over the 2-hour period of observation.  There were no complications.  Specifically the patient did not have nausea or vomiting or headache.  Specifically HA is less of a problem with Spravato. No SE with meds. Intensity of Spravato dissociation varies.  No scary and well tolerated. Continues to gradually improve with Spravato.  She is more motivated and enjoying things more.  H sees even more progress than she does; more active and smiling.  Wants to continue twice weekly until gets maximum response.  Dep is 80% better.  07/07/22 appt noted: Current meds: increased clonazepam to about 1.5 mg HS DT recent insomnia, lexapro 20, Dayvigo 10 mg HS, concerta 36 mg AM, trazodone 100 mg HS. Patient was administered Spravato 84 mg intranasally today.  The patient experienced the typical dissociation which gradually resolved over the 2-hour period of observation.  There were no complications.  Specifically the patient did not have  nausea or vomiting or headache.  Specifically HA is less of a problem with Spravato. No SE with meds. Intensity of Spravato dissociation varies.  No scary and well tolerated.  Was more intese today. Overall mood is markedly better and please with meds. No changes desired.  07/09/22 appt noted: In transition from Lexapro to sertraline and missing Concerta bc CO crash from it after about 4 hours.  Disc these concerns.  She'll discuss also with Corie Chiquito, NP More dep the  last couple of days and concerned about reducing Spravato frequency while transition from one antidep to another.  Affect less positive. Patient was administered Spravato 84 mg intranasally today.  The patient experienced the typical dissociation which gradually resolved over the 2-hour period of observation.  There were no complications.  Specifically the patient did not have nausea or vomiting or headache.  Specifically HA is less of a problem with Spravato. No SE with meds. Intensity of Spravato dissociation varies.  No scary and well tolerated.   07/14/22 appt noted: Has been more depressed this week.  More trouble with sleep.  Taking clonazepam 1-1.5  of the 0.5 mg tablets.   Trazodone not helping much. She switched back from 200 sertraline to Lexapro 20.  No SE More trouble with motivation.  Some stress with son with special needs. Misses the benevit of Concerta but it was too short acting and crashed in 4-6 hours. Patient was administered Spravato 84 mg intranasally today.  The patient experienced the typical dissociation which gradually resolved over the 2-hour period of observation.  There were no complications.  Specifically the patient did not have nausea or vomiting or headache.  Specifically HA is less of a problem with Spravato. No SE with meds. Intensity of Spravato dissociation varies.  No scary and well tolerated.   07/17/22 appt noted: Patient was administered Spravato 84 mg intranasally today.  The patient experienced the typical dissociation which gradually resolved over the 2-hour period of observation.  There were no complications.  Specifically the patient did not have nausea or vomiting or headache.  Specifically HA is less of a problem with Spravato. No SE with meds. Intensity of Spravato dissociation varies.  Not scary and well tolerated.  Just got Jornay last night but wasn't sure how to take it.  Wants to start and this was discussed.  She felt MPH helped mood and attn but  didn't last long enough and had crash after a few hours on Concerta.  Better for 2 hours after each dose Ritalin 20 mg with mood but then it wore off.  BP up after 3rd dose 160/90 and then took H's amlodipine 5 and BP became normal.  07/22/22 appt noted: Patient was administered Spravato 84 mg intranasally today.  The patient experienced the typical dissociation which gradually resolved over the 2-hour period of observation.  There were no complications.  Specifically the patient did not have nausea or vomiting or headache.  Specifically HA is less of a problem with Spravato. No SE with meds. Intensity of Spravato dissociation varies.  Not scary and well tolerated.  Start Jornay 20 mg over the weekend and did not notice any particular effect good or bad.  Increased to 40 mg. Other psych meds: Clonazepam 0.5 mg tablets 2 nightly, gabapentin dosage varies, Dayvigo 10 mg nightly, Lexapro 20 mg daily, to trazodone 100 mg nightly  07/24/22 appt noted: Patient was administered Spravato 84 mg intranasally today.  The patient experienced the typical dissociation which gradually resolved  over the 2-hour period of observation.  There were no complications.  Specifically the patient did not have nausea or vomiting or headache.  Specifically HA is less of a problem with Spravato. No SE with meds. Intensity of Spravato dissociation varies.  Not scary and well tolerated.  Other psych meds: Clonazepam 0.5 mg tablets 2 nightly, gabapentin dosage varies, Dayvigo 10 mg nightly, Lexapro 20 mg daily, to trazodone 100 mg nightly, Jornay 40 mg PM Doing well with Spravato.  Noticed a little effect from the Jornay 40 without SE but would like to increase the dose for ADD and mood.  Sleeping well.  No new concerns.  Still more depressed than she was with th initial benefit of Spravato.  07/30/22 note:  Patient was administered Spravato 84 mg intranasally today.  The patient experienced the typical dissociation which gradually  resolved over the 2-hour period of observation.  There were no complications.  Specifically the patient did not have nausea or vomiting or headache.  Specifically HA is less of a problem with Spravato. Other psych meds: Clonazepam 0.5 mg tablets 2 nightly, gabapentin dosage varies, Dayvigo 10 mg nightly, Lexapro 20 mg daily, to trazodone 100 mg nightly, Jornay 60 mg PM Wants to increase Jornay to 80 mg PM to increase benefit for ADD and depression. No SE She has experienced a major family crisis that threatens to change her daily life from now on.  It has not triggered suicidal thoughts at this time but she is very afraid.  No desire to change medications.  08/04/22 appt noted" Patient was administered Spravato 84 mg intranasally today.  The patient experienced the typical dissociation which gradually resolved over the 2-hour period of observation.  There were no complications.  Specifically the patient did not have nausea or vomiting or headache.  Specifically HA is less of a problem with Spravato. Other psych meds: Clonazepam 0.5 mg tablets 2 nightly, gabapentin dosage varies, Dayvigo 10 mg nightly, Lexapro 20 mg daily, trazodone 100 mg nightly, Jornay 80 mg PM No SE No benefit or SE with increase Jormay 80 mg.  Says Concerta helped ADD and depression but not Jornay.  However duration was insufficient.  Still depressed and wants change to another form of stimulant.  No concerns with other meds. Continues with strong family stressors creating uncertainty about her future but no quick resolution. No SI Trial Cotempla 17.3 and DC Ritalin & Jornay 80  08/11/2022 appointment noted: Patient was administered Spravato 84 mg intranasally today.  The patient experienced the typical dissociation which gradually resolved over the 2-hour period of observation.  There were no complications.  Specifically the patient did not have nausea or vomiting or headache.  Specifically HA is less of a problem with Spravato now  vs when started it.. Other psych meds: Clonazepam 0.5 mg tablets 2 nightly, gabapentin dosage varies, Dayvigo 10 mg nightly, trazodone 100 mg nightly, cotempla 17.3, switch from Lexapro 20 to sertaline 15o mg . No noticeable effects sertraline from for depression but has seen some antianxiety effects from the sertraline.  No side effects noted.  No side effects with the other medicines.  Still is only getting very brief benefit 3 to 4 hours from Cotempla for ADD and mood.  She wants to try the Daytrana patch as we have discussed before. No SE  08/13/22 appt: Patient was administered Spravato 84 mg intranasally today.  The patient experienced the typical dissociation which gradually resolved over the 2-hour period of observation.  There were no  complications.  Specifically the patient did not have nausea or vomiting or headache.  Specifically HA is less of a problem with Spravato now vs when started it.. Other psych meds: Clonazepam 0.5 mg tablets 2 nightly, gabapentin dosage varies, Dayvigo 10 mg nightly, trazodone 100 mg nightly, cotempla 17.3, switch from Lexapro 20 to sertaline 15o mg . No noticeable effects sertraline from for depression but has seen some antianxiety effects from the sertraline.  No side effects noted.  No side effects with the other medicines.  Still is only getting very brief benefit 3 to 4 hours from Cotempla for ADD and mood.  She wants to try the Daytrana patch as we have discussed before.  Hasn't been able to get it yet. No SE Plan.  Continue to try to get Daytrana 30 AM  08/18/22 appt noted: Patient was administered Spravato 84 mg intranasally today.  The patient experienced the typical dissociation which gradually resolved over the 2-hour period of observation.  There were no complications.  Specifically the patient did not have nausea or vomiting or headache.  Specifically HA is less of a problem with Spravato now vs when started it.. Other psych meds: Clonazepam 0.5 mg  tablets 2 nightly, gabapentin dosage varies, Dayvigo 10 mg nightly, trazodone 100 mg nightly, cotempla 17.3, switch from Lexapro 20 to sertaline 15o mg . No noticeable effects sertraline from for depression but has seen some antianxiety effects from the sertraline.  No side effects noted.  No side effects with the other medicines.  Still is only getting very brief benefit 3 to 4 hours from Cotempla for ADD and mood.  She wants to try the Daytrana patch as we have discussed before.  Hasn't been able to get it yet.  Overall depression is some better than it was.   No SE Plan.  Continue to try to get Daytrana 30 AM  08/25/22 appt : No benefit Daytrana.  No SE.  Wants further med changes and interested In retrying pramipexole.   Tolerating meds. Patient was administered Spravato 84 mg intranasally today.  The patient experienced the typical dissociation which gradually resolved over the 2-hour period of observation.  There were no complications.  Specifically the patient did not have nausea or vomiting or headache.  Specifically HA is less of a problem with Spravato now vs when started it.. Other psych meds: Clonazepam 0.5 mg tablets 2 nightly, gabapentin dosage varies, Dayvigo 10 mg nightly, trazodone 100 mg nightly, , switch from Lexapro 20 to sertaline 15o mg . Daytrana 30 AM for a few days. Struggles with depression and no SI.  Reduced interest and mood and motivation and enjoyment and socialization.  08/27/22 appt noted: Psych meds:  Clonazepam 0.5 mg tablets 2 nightly, gabapentin dosage varies, Dayvigo 10 mg nightly, trazodone 100 mg nightly,  sertaline 15o mg . Returned to [employer] AM, pramipexole  Tolerating meds. Patient was administered Spravato 84 mg intranasally today.  The patient experienced the typical dissociation which gradually resolved over the 2-hour period of observation.  There were no complications.  Specifically the patient did not have nausea or vomiting or headache.   Reports  taking cotempla bc brief mood benefit and sig focus and productivity benefit. Took 1 dose pramipexole and felt more dep.  09/01/22 appt noted: Psych meds:  Clonazepam 0.5 mg tablets 2 nightly, gabapentin dosage varies, Dayvigo 10 mg nightly, trazodone 100 mg nightly,  sertaline 15o mg . Returned to Cotempla AM, pramipexole  0.25 mg BID. Tolerating meds now. Patient  was administered Spravato 84 mg intranasally today.  The patient experienced the typical dissociation which gradually resolved over the 2-hour period of observation.  There were no complications.  Specifically the patient did not have nausea or vomiting or headache.   Willing to try the pramipexole and will continue 0.25 mg BID this week and then consider increase if tolerated.  Mood is a little better.  Still looking for further improvement.  09/03/22 appt noted: Psych meds:  Clonazepam 0.5 mg tablets 2 nightly, gabapentin dosage varies, Dayvigo 10 mg nightly, trazodone 100 mg nightly,  sertaline 15o mg . Returned to Cotempla AM, pramipexole  0.25 mg tablet 1 and 1/2 tablets twice daily BID. Tolerating meds now. Patient was administered Spravato 84 mg intranasally today.  The patient experienced the typical dissociation which gradually resolved over the 2-hour period of observation.  There were no complications.  Specifically the patient did not have nausea or vomiting or headache.   Depression may be a little better with the parmipexole and is tolerating it well so far.  Still struggling with focus and residual depression.  Tolerating meds without SE.  More hopeful and able to enjoy some things.  09/08/22 appt  Psych meds:  Clonazepam 0.5 mg tablets 2 nightly, gabapentin dosage varies, Dayvigo 10 mg nightly, trazodone 100 mg nightly,  sertaline 15o mg . Returned to Cotempla AM, pramipexole  0.5 mg BID. Tolerating meds now. Patient was administered Spravato 84 mg intranasally today.  The patient experienced the typical dissociation which  gradually resolved over the 2-hour period of observation.  There were no complications.  Specifically the patient did not have nausea or vomiting or headache.   She has seen her depression drop from a 10/10 prior to treatment with Spravato to now 4/10 With additional benefit noted when she increased the pramipexole to 0.5 mg twice daily.  She is not having side effects with this like she did in the past. Anxiety levels are also improved. She is sleeping okay with the current medications. Plan: continue pramipexole trial off-label and increase more slowly for TRD.  Increase  Gradually  to 0.75 mg BID  09/11/22 appt noted: Psych meds:  Clonazepam 0.5 mg tablets 2 nightly, gabapentin dosage varies, Dayvigo 10 mg nightly, trazodone 100 mg nightly,  sertaline 15o mg . Returned to Cotempla AM, pramipexole  0.75 mg BID. Tolerating meds now. Patient was administered Spravato 84 mg intranasally today.  The patient experienced the typical dissociation which gradually resolved over the 2-hour period of observation.  There were no complications.  Specifically the patient did not have nausea or vomiting or headache.   She has seen her depression drop from a 10/10 prior to treatment with Spravato to now 4/10 With additional benefit noted when she increased the pramipexole to 0.5 mg twice daily.  She is not having side effects with this like she did in the past. Clearly improving with the pramipexole now and tolerating it.  Satisfied with current meds but would consider increasing if it might be helpful.  09/15/22 appt noted: Psych meds:  Clonazepam 0.5 mg tablets 2 nightly, gabapentin dosage varies, Dayvigo 10 mg nightly, trazodone 100 mg nightly,  sertaline 15o mg . Returned to Cotempla AM, pramipexole  0.75 mg BID too anxious and reduced to 0.5 mg BID. Tolerating meds now. Patient was administered Spravato 84 mg intranasally today.  The patient experienced the typical dissociation which gradually resolved over  the 2-hour period of observation.  There were no complications.  Specifically  the patient did not have nausea or vomiting or headache.   Trouble staying asleep longer than 6 hours.  Doesn't feel she can tolerate pramipexole 0.75 mg BID DT anxiety not experienced at  0.5 mg BID.  Otherwise tolerating meds.  Mood is better with Spravato and pramipexole.   Not sleeping as long with meds.  EMA 6 hours.  Wonders about change Disc concerns about waiting for therapy. Feels ready to reduce Spravato to weekly.    09/25/22 appt noted: Psych meds:  Clonazepam 0.5 mg tablets 2 nightly, gabapentin dosage varies, Dayvigo 10 mg nightly, trazodone 100 mg nightly,  sertaline 15o mg . Returned to Cotempla AM, pramipexole  0.75 mg BID too anxious and reduced to 0.5 mg BID. Tolerating meds now. Patient was administered Spravato 84 mg intranasally today.  The patient experienced the typical dissociation which gradually resolved over the 2-hour period of observation.  There were no complications.  Specifically the patient did not have nausea or vomiting or headache.   Trouble staying asleep longer than 6 hours.  Failed to respond to Seroquel 25 mg HS.  Gone back to [facility].  Belsomra NR. Mood ok until the last few days more dep bc it's been 10 days since last admin.  Wants to continue weekly.   No SE.  No med changes desired.  09/29/22 appt noted: Psych meds:  Clonazepam 0.5 mg tablets 2 nightly, gabapentin dosage varies, Dayvigo 10 mg nightly, trazodone 100 mg nightly,  sertaline 15o mg . Returned to Cotempla AM, pramipexole  0.75 mg BID too anxious and reduced to 0.5 mg BID. Tolerating meds now. Patient was administered Spravato 84 mg intranasally today.  The patient experienced the typical dissociation which gradually resolved over the 2-hour period of observation.  There were no complications.  Specifically the patient did not have nausea or vomiting or headache.   Trouble staying asleep longer than 6 hours.  Plan:  Continue sertaline to 200 mg daily.  It has helped anxiety but not depression.   for insomnia, clonazepam 1 mg HS. trazodone doesn't work well but helps some Continue Dayvigo 10 HS.  Failed resp Belsomra 15 and seroquel 25 She wants to continue Cotempla or Concerta bc helps mood early in day and focus  benefit lasts longer.  Have not been able to get stimulant to be consitently effective throughout the day. continue pramipexole trial off-label and reduce back to  TRD 0.5 mg BID Spravato weekly.  10/13/22 appt noted: Not sleeping as well.  Wants to increase Spravato to twice weekly bc feeling more dep close to the next sched date.  Just the day or 2 before.  .  Plan: Trial for off label TRD increase pramipexole 1 mg TID  10/20/22 appt noted: Psych meds:  Clonazepam 0.5 mg tablets 2 nightly, gabapentin dosage varies, Dayvigo 10 mg nightly, trazodone 100 mg nightly,  sertaline 15o mg . Returned to Cotempla AM, pramipexole  1 mg TID. Tolerating meds now. Patient was administered Spravato 84 mg intranasally today.  The patient experienced the typical dissociation which gradually resolved over the 2-hour period of observation.  There were no complications.  Specifically the patient did not have nausea or vomiting or headache.   Trouble staying asleep longer than 6 hours.  More dep this week and asked to get Spravato twice this week. Plan: Trial for off label TRD increase pramipexole 1 mg QID  10/22/22 appt noted:  Psych meds:  Clonazepam 0.5 mg tablets 2 nightly and another one with  EMA, gabapentin dosage varies, Belsomra 20 mg daily,  trazodone 100 mg nightly,  sertaline 15o mg . Returned to Cotempla AM, pramipexole  1 mg QID for 2nd day Tolerating meds now.  Except a little N and dizzy briefly after takes it. Patient was administered Spravato 84 mg intranasally today.  The patient experienced the typical dissociation which gradually resolved over the 2-hour period of observation.  There were no  complications.  Specifically the patient did not have nausea or vomiting or headache. No change in mood yet.   Sleep better with Belsomra 20 as long as takes with other meds.  If insomnia mood is worse. Ongoing some dep but gets partial relief with Spravato but would like better relief .  10/27/22 received Spravato 84  11/03/22 appt noted:  Meds as above except sertraline she cut to 100 mg instead of 200 mg daily She doesn't think sertraline helping and worries over poss SE Off and on Cotempla .  Not affecting BP. Doesn't notice benefit or SE with pramipexole 1 mg QID.  No SE Sleep is OK.  Some awakening aftter 6 hours but not enough so takes another clonazepam.   Patient was administered Spravato 84 mg intranasally today.  The patient experienced the typical dissociation which gradually resolved over the 2-hour period of observation.  There were no complications.  Specifically the patient did not have nausea or vomiting or headache. No change in mood yet.   Sleep better with Belsomra 20 as long as takes with other meds.  If insomnia mood is worse. Ongoing some dep but gets partial relief with Spravato but would like better relief .  No sig benefit with pramipexole 1 mg QID.  11/21/22 TC:  To ER with dep 11/21/22. No SE with selegiline but feels more dep.  Wants to stop it. Today is only day 2 of 1 tablet of selegiline 5 mg AM.  Reviewed with her that she had not been satisfied with the response of Spravato and Zoloft and that she is likely feeling worse because the Zoloft is wearing off and in particular because she abruptly stopped 150 mg daily.  She needs to give the selegiline time to work.  She agrees to do so.  She admitted to some passive suicidal thoughts but no active suicidal thoughts and no desire to die.  She agrees to the plan to increase selegiline to 10 mg every morning this weekend and to continue the selegiline trial.  She cannot stop this and immediately start another AD due to risk  DDI with serotonin syndrome.  She is aware.  Meredith Staggers, MD, DFAPA  12/19/22 TC: Pt called at 1:42p.  She said she wanted to know if she could start weaning off the Selegiline.  She thinks increasing the dosage is making things worse.  She said she wants to go back to [employer].  She acknowleged she has an appt on Monday. I advised her that I wasn't sure Dr Jennelle Human will see her message before her appt, but she still ask me to send the message.  Next appt 8/26 MD resp.  No change until Monday appt.  12/22/22 appt noted: Meds: selegiline up to 5 mg 4 daily for a week but felt it incr irritability and went back to 3 mg daily. She wants to go back to Spravato.  Not near as dep as now.  Selegiline didn't help energy. Still on Belsomra Thinks maybe sertraline helped mood more than she thought. More dep than anxiety.  Plan: DC selegiline and will retry Effexor which helped in the past at 300 mg daily.  Start this Friday after over 10 half lives of selegiline.    12/24/22 appt noted: Stopped selegiline  Psych med: belsomra 20, clonazepam 1 mg HS.   She has felt more dep off the Spravato though at the time didn't think it was helping.  Has felt more down and withdrawn.  Low energy, motivation, not smiling.  No SI.  Sleep ok.   Patient was administered Spravato 84 mg intranasally today.  The patient experienced the typical dissociation which gradually resolved over the 2-hour period of observation.  There were no complications.  Specifically the patient did not have nausea or vomiting or headache. No problems with BP or other unusual SE Mood is a little better after this administration of Spravato.  Very motivated to continue it.  No immediate med concerns. No SI .  Ongoing anhedonia.  12/25/22 appt noted: Psych med: belsomra 20, clonazepam 1 mg HS.  Not resumed stimulant or AD yet   No SE. Patient was administered Spravato 84 mg intranasally today.  The patient experienced the typical dissociation  which gradually resolved over the 2-hour period of observation.  There were no complications.  Specifically the patient did not have nausea or vomiting or headache.  There is a lot things going on in her life and it is somewhat difficult to separate stressors from what is going on with her mood.  However she is happy to resume Spravato as she decided after stopping briefly that it was helpful.  She plans to start the antidepressant Morrow.  She also wants to resume the stimulant tomorrow.  12/30/22 appt noted; Psych med: belsomra 20, clonazepam 1 mg HS.  Not resumed stimulant.  Effexor XR 37.5 mg daily   No SE. Patient was administered Spravato 84 mg intranasally today.  The patient experienced the typical dissociation which gradually resolved over the 2-hour period of observation.  There were no complications.  Specifically the patient did not have nausea or vomiting or headache.  She is still having depression with very prominent anhedonia.  However she feels the Spravato is somewhat helpful and is hopeful for more complete response as she gets back onto a reasonable antidepressant dose.  She tolerated Effexor XR 300 mg daily in the past and took it for 5 months and it is probably the antidepressants worked best for her.  She is agreeable with this plan.  She is getting sleep benefit from the current medications Plan: incr Effexor 75 mg daily  01/02/23 appt noted: Psych meds as noted above except increase Effexor XR to 75 mg daily without side effects. Patient was administered Spravato 84 mg intranasally today.  The patient experienced the typical dissociation which gradually resolved over the 2-hour period of observation.  There were no complications.  Specifically the patient did not have nausea or vomiting or headache.  Agrees to increasing Effexor as planned.  SX not changed from that noted above.  No SI  01/05/23 appt noted: Psych meds as noted above except increase Effexor XR to 112.5 mg daily  without side effects. Patient was administered Spravato 84 mg intranasally today.  The patient experienced the typical dissociation which gradually resolved over the 2-hour period of observation.  There were no complications.  Specifically the patient did not have nausea or vomiting or headache.  Agrees to increasing Effexor as planned gradually to 300 mg daily.  SX not changed from that noted above.  Does however feel Spravato has dampened the degree of depression.  No SI Stressful life event discussed and won't be resolved until early next year.  01/07/23 appt noted: Psych meds Effexor XR 112.5 mg daily. Patient was administered Spravato 84 mg intranasally today.  The patient experienced the typical dissociation which gradually resolved over the 2-hour period of observation.  There were no complications.  Specifically the patient did not have nausea or vomiting or headache.  Agrees to increasing Effexor as planned gradually to 300 mg daily.  SX not changed from that noted above.  Does however feel Spravato has dampened the degree of depression markedly compared to when off psych meds and no Spravato.  No SI  01/12/23 appt noted; Psych meds Effexor XR 225 mg daily. No SE notedl. Patient was administered Spravato 84 mg intranasally today.  The patient experienced the typical dissociation which gradually resolved over the 2-hour period of observation.  There were no complications.  Specifically the patient did not have nausea or vomiting.  But is having headache.  Agrees to increasing Effexor as planned gradually to 300 mg daily.  SX not changed from that noted above.  Does however feel Spravato has dampened the degree of depression markedly compared to when off psych meds and no Spravato.  No SI  01/15/23 appt noted:  Psych meds Effexor XR 225 mg daily. No SE noted. Patient was administered Spravato 84 mg intranasally today.  The patient experienced the typical dissociation which gradually resolved  over the 2-hour period of observation.  There were no complications.  Specifically the patient did not have nausea or vomiting.   Doing ok with higher doses of Effexor and will plan to increase to 300 mg daily. Mild improvement noticed so far with the incrase in meds.   Uses Cotempla prn and it helps but doesn't liek the way she feels when it wears off.  01/22/23 appt noted: Psych meds reduced Effexor XR 150 mg daily. Bc of HA No SE noted. Patient was administered Spravato 84 mg intranasally today.  The patient experienced the typical dissociation which gradually resolved over the 2-hour period of observation.  There were no complications.  Specifically the patient did not have nausea or vomiting.  Ongoing moderate levels of depression with some improvement from the Spravato.  She is not sure about the effect of the venlafaxine on her mood yet.  She would like to continue to try pursuing a stimulant but has had difficulty tolerating p.o. versions of stimulants because of the crash at the end and side effects with many of them.  She has several questions about this.  01/27/23 appt noted: Psych meds: Clonazepam 1 to 1.5 mg nightly, lithium CR 300 nightly, Belsomra 20 nightly, venlafaxine XR 150 every morning No side effects noted at the current dosage. Patient was administered Spravato 84 mg intranasally today.  The patient experienced the typical dissociation which gradually resolved over the 2-hour period of observation.  There were no complications.  Specifically the patient did not have nausea or vomiting.  Ongoing moderate levels of depression with some improvement from the Spravato.   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  She does not tolerate oral stimulants very well because she has a crash as they wear off which is very dysphoric.  She is willing to retry the Daytrana patches that she took in the spring.  She took 1 patch on  1 occasion recently and  felt it was helpful. However she is also having problems with early morning awakening over the last week without precipitant.  She would like to have a different treatment strategy for sleep but understands her sleep problems have historically been difficult to resolve and maintain.  02/10/23 appt noted: Psych meds: Clonazepam 1 to 1.5 mg nightly, lithium CR 300 nightly, Belsomra 20 nightly, venlafaxine XR 150 every morning, Daytrana 30 mg Am No side effects noted at the current dosage. Patient was administered Spravato 84 mg intranasally today.  The patient experienced the typical dissociation which gradually resolved over the 2-hour period of observation.  There were no complications.  Specifically the patient did not have nausea or vomiting.  Ongoing moderate levels of depression with some improvement from the Spravato.  Particularly anhedonia.  No sig benefit noted with Daytrana and problems with table stimulants bc gets brief benefit with unpleasant "crash".  Wants to try another type of patch if available.  02/17/23 appt noted: Psych meds: Clonazepam 1 to 1.5 mg nightly, lithium CR 300 nightly, Belsomra 20 nightly, venlafaxine XR 225 mg every morning, Daytrana 30 mg Am No side effects noted at the current dosage. Patient was administered Spravato 84 mg intranasally today.  The patient experienced the typical dissociation which gradually resolved over the 2-hour period of observation.  There were no complications.  Specifically the patient did not have nausea or vomiting.  Ongoing moderate levels of depression with some improvement from the Spravato.  Particularly anhedonia.  She has not gotten the new stimulant yet but is hopeful. No problems with increasing venlafaxine.   Mood is better with Spravato and venlafaxine.  Less anhedonia, less down. Themazepam not helpful.    03/04/23 received Spravato 84 03/12/23 received Spravato 84  03/19/23 appt noted: Psych meds:  Clonazepam 1 to 1.5 mg nightly, lithium CR 300 nightly, Belsomra 20 nightly, venlafaxine XR 300 mg every morning, tried various stimulants lately No side effects noted at the current dosage. Patient was administered Spravato 84 mg intranasally today.  The patient experienced the typical dissociation which gradually resolved over the 2-hour period of observation.  There were no complications.  Specifically the patient did not have nausea or vomiting.  Not getting good duration from oral stimulants.  She would like to try another patch but has to take tabs first and try them before insurance will cover patches.  Overall dep is better with meds and Spravato.  Clearly sees benefit.  03/30/23 appt noted: Psych meds: Clonazepam 1 to 1.5 mg nightly, lithium CR 300 nightly,  venlafaxine XR 300 mg every morning, tried various stimulants lately, gabapentin 900 mg HS, dexmethylphenidate ER without help No side effects noted at the current dosage. Patient was administered Spravato 84 mg intranasally today.  The patient experienced the typical dissociation which gradually resolved over the 2-hour period of observation.  There were no complications.  Specifically the patient did not have nausea or vomiting.  Able to leave at end of 2 hour period without assistance.  Clear mood benefit with Spravato with much less dep and wants to continue. Still trouble sleeping. TR insomnia.  Ongoing conc problems with minimal brief benefit from stimulants.  Not able to get patch Caprice Red yet DT insurance.  AIMS    Flowsheet Row Video Visit from 01/03/2022 in [facility]  AIMS Total Score 0      ECT-MADRS    Flowsheet Row Clinical Support from 08/13/2022 in [facility] Video Visit from 04/29/2022  in [facility] Video Visit from 02/06/2022 in [facility]  MADRS Total Score 27 44 23      GAD-7    Flowsheet Row  Counselor from 12/19/2022 in [facility] Office Visit from 08/22/2022 in [facility] [place]  Total GAD-7 Score 4 7      PHQ2-9    Flowsheet Row Counselor from 12/19/2022 in [facility] Office Visit from 08/22/2022 in [facility] [place] Office Visit from 08/19/2022 [facility] Video Visit from 02/06/2022 in [facility] Office Visit from 11/16/2020 in [facility] at [facility] Total Score 2 3 0 6 0  PHQ-9 Total Score 5 5 -- 14 --        Past Psychiatric Medication Trials: Trintellix - Initially effective and then was not effective when re-started Paxil- Ineffective. Helped initially. Prozac-Insomnia Lexapro- Effective and then no longer as effective Zoloft- Initially effective and then minimal response Viibryd Cymbalta- Ineffective. Helped initially. Effexor XR 300- Effective, well tolerated. Pristiq- Increased anxiety at 150 mg daily Wellbutrin XL-Ineffective.May have increased anxiety. Amitriptyline Nortriptyline-Caused irritability, constipation Auvelity- ineffective Selegilne brief 20 mg daily , more irritable  Abilify- Helpful but caused severe insomnia. Rexulti-Helpful but caused insomnia. Vraylar Seroquel - Ineffective Risperdal Olanzapine- Ineffective Latuda- Ineffective Caplyta  Klonopin- Effective Temazepam- Took during menopause Lunesta- Ineffective Ambien-Ineffective Sonata- Ineffective Dayvigo- partially effective  Belsomra 20 worked and then failed  Trazodone-  somewhat effective Remeron-Ineffective. Does not recall wt gain.  Quetiapine 25 NR  Adderall- Took once and had adverse effect. Concerta- Effective but only 4-6 hours Jornay 80 NR Metadate-Not as effective as Concerta Dexmethylphenidate ER 10/02/21- Not as effective as  Concerta Azstarys- some side effects. Not as effective.  Ritalin short acting and SE anxiety & HTN @20  TID Cotempla 17.3  Daytrana NR  Lamictal- May have had an adverse effects. "Felt weird." Reports worsening s/s.  Lithium ? effect Gabapentin Pramipexole- 1 mg QID NR  ECT #20 with minimal response. H says it was partly helpful.  AIMS    Flowsheet Row Video Visit from 01/03/2022 in [facility]  AIMS Total Score 0      ECT-MADRS    Flowsheet Row Clinical Support from 08/13/2022 in [facility] Video Visit from 04/29/2022 in [facility] Video Visit from 02/06/2022 in [facility]  MADRS Total Score 27 44 23      GAD-7    Flowsheet Row Counselor from 12/19/2022 in [facility] Office Visit from 08/22/2022 in [facility] [place]  Total GAD-7 Score 4 7      PHQ2-9    Flowsheet Row Counselor from 12/19/2022 in [facility] Office Visit from 08/22/2022 in [facility] [place] Office Visit from 08/19/2022 in [facility] at [facility] Video Visit from 02/06/2022 in [facility] Office Visit from 11/16/2020 in [facility] at [facility] Total Score 2 3 0 6 0  PHQ-9 Total Score 5 5 -- 14 --        Review of Systems:  Review of Systems  Constitutional:  Positive for fatigue.  Cardiovascular:  Negative for chest pain and palpitations.  Neurological:  Negative for dizziness, tremors and headaches.  Psychiatric/Behavioral:  Positive for dysphoric mood and sleep disturbance. Negative for agitation, confusion, decreased concentration and suicidal ideas. The patient is nervous/anxious.     Medications: I have reviewed the patient's current medications.  Current Outpatient  Medications  Medication Sig Dispense Refill   clonazePAM (KLONOPIN) 1 MG tablet Take 1-2 tablets (1-2 mg total) by mouth at bedtime. 60 tablet 0   Dexmethylphenidate HCl 40 MG CP24 Take 1 capsule (40 mg total) by mouth every morning. 15 capsule 0   Esketamine HCl, 84 MG Dose, (SPRAVATO, 84 MG DOSE,) 28 MG/DEVICE SOPK Place 84 mg into the nose once a week. 3 each 1   Estradiol (VAGIFEM) 10 MCG TABS vaginal tablet Place 1 tablet (10 mcg total) vaginally 2 (two) times a week. 24 tablet 3   gabapentin (NEURONTIN) 100 MG capsule Take 2 capsules (200 mg) by mouth for mid nocturnal awakening. 60 capsule 2   gabapentin (NEURONTIN) 300 MG capsule Take 3 capsules (900 mg total) by mouth at bedtime. 90 capsule 0   lithium carbonate (LITHOBID) 300 MG ER tablet Take 2 tablets (600 mg total) by mouth at bedtime. 180 tablet 0   valACYclovir (VALTREX) 500 MG tablet Take one twice daily for 3 days with any symptoms 30 tablet 3   valsartan (DIOVAN) 80 MG tablet Take 1 tablet (80 mg total) by mouth daily. 90 tablet 3   venlafaxine XR (EFFEXOR-XR) 150 MG 24 hr capsule Take 1 capsule (150 mg total) by mouth 2 (two) times daily. 60 capsule 0   Dextroamphetamine (XELSTRYM) 13.5 MG/9HR PTCH Place 1 patch onto the skin every morning. (Patient not taking: Reported on 04/04/2023) 30 patch 0   Suvorexant (BELSOMRA) 20 MG TABS Take 1 tablet (20 mg total) by mouth at bedtime. (Patient not taking: Reported on 04/04/2023) 30 tablet 0   No current facility-administered medications for this visit.    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   TRANSAMINASES, SERUM, ELEVATED  08/14/2009    Past Medical History, Surgical history, Social history, and Family history were reviewed and updated as appropriate.   Please see review of systems for further details on the patient's review from today.   Objective:   Physical Exam:  LMP 12/27/2007 (Exact Date)   Physical Exam Constitutional:      General: She is not in acute distress. Musculoskeletal:        General: No deformity.  Neurological:     Mental Status: She is alert and oriented to person, place, and time.     Coordination: Coordination normal.  Psychiatric:        Attention and Perception: Perception normal. She is inattentive. She does not perceive auditory hallucinations.        Mood and Affect: Mood is anxious and depressed. Affect is blunt. Affect is not tearful.        Speech: Speech normal.        Behavior: Behavior normal.        Thought Content: Thought content normal. Thought content is not delusional. Thought content does  not include homicidal or suicidal ideation. Thought content does not include suicidal plan.        Cognition and Memory: Cognition and memory normal.        Judgment: Judgment normal.     Comments: Insight intact 4/10 dep but better with Spravato.  She is doing better overall. Affect blunted but with a little more expression      Lab Review:     Component Value Date/Time   NA 132 (L) 10/27/2022 1107   K 4.4 10/27/2022 1107   CL 95 (L) 10/27/2022 1107   CO2 30 10/27/2022 1107   GLUCOSE 89 10/27/2022 1107   BUN 14 10/27/2022 1107   CREATININE 0.67 10/27/2022 1107   CREATININE 0.68 01/26/2020 0920   CALCIUM 10.1 10/27/2022 1107   PROT 8.3 04/22/2022 0932   ALBUMIN 5.3 (H) 04/22/2022 0932   AST 82 (H) 04/22/2022 0932   ALT 118 (H) 04/22/2022 0932   ALKPHOS 74 04/22/2022 0932   BILITOT 0.3 04/22/2022 0932   GFRNONAA >90 05/01/2014 1645   GFRAA >90 05/01/2014 1645       Component Value Date/Time   WBC 3.3 (L) 04/22/2022 0932   RBC 4.25 04/22/2022 0932   HGB  14.3 04/22/2022 0932   HGB 14.1 10/26/2014 1034   HCT 40.7 04/22/2022 0932   PLT 245.0 04/22/2022 0932   MCV 95.7 04/22/2022 0932   MCH 34.4 (H) 01/26/2020 0920   MCHC 35.1 04/22/2022 0932   RDW 13.2 04/22/2022 0932   LYMPHSABS 0.9 04/22/2022 0932   MONOABS 0.2 04/22/2022 0932   EOSABS 0.1 04/22/2022 0932   BASOSABS 0.0 04/22/2022 0932    No results found for: "POCLITH", "LITHIUM"   No results found for: "PHENYTOIN", "PHENOBARB", "VALPROATE", "CBMZ"   .res Assessment: Plan:    Heather Davila was seen today for follow-up, depression, anxiety, add and sleeping problem.  Diagnoses and all orders for this visit:  Recurrent major depression resistant to treatment (HCC)  Generalized anxiety disorder  Attention deficit hyperactivity disorder (ADHD), predominantly inattentive type  Insomnia, unspecified type    Pt seen for TRD with multiple failed medications as noted above.    Mood is better with venlafaxine XR 300 with Spravato but ongoing dep with anhedonia and fatigue  Patient was administered Spravato 84 mg intranasally today.  The patient experienced the typical dissociation which gradually resolved over the 2-hour period of observation.  There were no complications.  Specifically the patient did not have nausea or vomiting or headache.  Blood pressures remained within normal ranges at the 40-minute and 2-hour follow-up intervals.  By the time the 2-hour observation period was met the patient was alert and oriented and able to exit without assistance.  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  See nursing note for further details.  Consider switch Spravato to MAOI, Parnate  We discussed the short-term risks associated with benzodiazepines including sedation and increased fall risk among others.  Discussed long-term side effect risk including dependence, potential withdrawal symptoms, and the  potential eventual dose-related risk of dementia.  But recent studies from 2020 dispute this association between benzodiazepines and dementia risk. Newer studies in 2020 do not support an association with dementia.  for insomnia, wants to continue clonazepam 2 mg nightly.  DC Belsomra DT limited respond  Cotempla or Concerta bc helps mood early in day and focus  benefit lasts longer.  Have not been able to get stimulant  to be consitently effective throughout the day. Conisder switching to patch bc pt has significant crash with stimulants or perhaps [place].  Stimulant tablets cause crash with inadequate duration .  Patches should provide smoother response.  Will try another patch, Noemi Chapel if we can get it approved.   Insurance denied PA for this medication stating she has to try dextroamphetamine ER, Adderall XR, and Vyvanse before will be considered. Dexmethylphenidate ER 10/02/21- Not as effective as Concerta Have been to try Adderall XR because she did not tolerate Adderall at home I am reluctant to prescribe Vyvanse for the same reason but we can try a low dose of Vyvanse in hopes of avoiding side effects she had with Adderall.  Discussed potential benefits, risks, and side effects of stimulants with patient to include increased heart rate, palpitations, insomnia, increased anxiety, increased irritability, or decreased appetite.  Instructed patient to contact office if experiencing any significant tolerability issues.   increased venlafaxine XR to 300 mg daily.    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  Disc pretreating Spravato with NSAID or Tylenol bc of HA lately.  Tried augmenting sleep meds with gabapentin.  Initially though it helped but not much .  DC it and retry trazodone  50 mg HS  Supportive therapy dealing with serious stressful life event.  FU twice weekly Spravato until next week then try to drop back to weekly .  Meredith Staggers, MD,  DFAP[facility]    Please see After Visit Summary for patient specific instructions.  Future Appointments  Date Time Provider Department Center  05/01/2023  9:00 AM Gaspar Bidding, [facility] None  05/15/2023  9:00 AM Gaspar Bidding, [facility] None  05/29/2023  9:00 AM Gaspar Bidding, [facility] None  06/09/2023  9:00 AM Gaspar Bidding, [facility] None  06/23/2023  9:00 AM Gaspar Bidding, [facility] None  09/18/2023  8:15 AM Jerene Bears, MD [facility]    No orders of the defined types were placed in this encounter.   -------------------------------

## 2023-04-06 ENCOUNTER — Ambulatory Visit: Payer: 59 | Admitting: Professional Counselor

## 2023-04-14 ENCOUNTER — Ambulatory Visit: Payer: 59 | Admitting: Psychiatry

## 2023-04-14 ENCOUNTER — Ambulatory Visit: Payer: 59

## 2023-04-14 VITALS — BP 112/75 | HR 81

## 2023-04-14 DIAGNOSIS — F900 Attention-deficit hyperactivity disorder, predominantly inattentive type: Secondary | ICD-10-CM | POA: Diagnosis not present

## 2023-04-14 DIAGNOSIS — F411 Generalized anxiety disorder: Secondary | ICD-10-CM | POA: Diagnosis not present

## 2023-04-14 DIAGNOSIS — F339 Major depressive disorder, recurrent, unspecified: Secondary | ICD-10-CM

## 2023-04-14 DIAGNOSIS — G4700 Insomnia, unspecified: Secondary | ICD-10-CM

## 2023-04-14 NOTE — Progress Notes (Signed)
NURSES NOTE:           Patient arrived for her #62 Spravato treatment. Pt is coming every other week now. Pt is being treated for Treatment Resistant Depression, pt will be receiving 84 mg (3 of the 28 mg) nasal sprays, this is her maintenance dose. Patient taken to treatment room, I explained and discussed her treatment and how the schedule should go, along with any side effects that may occur. Answered any questions and concerns the patient had. Pt's Spravato is delivered through [place] and she is now billing with her insurance. Spravato medication is stored at doctors office per REMS/FDA guidelines. The medication is required to be locked behind two doors per FDA/REMS Protocol. Medication is also disposed of properly per regulations. All treatments and her vital signs are documented in Spravato REMS per protocol of treatment center regulations. [facility] delivers her medication.      Vital Signs assessed at 8:08 AM 127/75, pulse 82, pulse ox 92%. Instructed pt to blow her nose and recline back slightly to prevent any of medication dripping out of her nose.   Pt. Given 1st dose (28 mg inhaler), then 5 minutes between 2nd dose and 3rd dose for total of 84 mg. No complaints of nausea/vomiting reported. Pt did listen to music today, she just reclined back and closed her eyes. After 40 minutes I went to assess her vital signs, no side effects or symptoms reported, B/P 126/76, pulse 68.  Dr. Jennelle Human talked to pt today about her treatment. Nurse was with pt a total of 60 minutes but pt observed for 120 minutes per protocol. Discharge vital signs were at 9:52 AM, B/P 112/75, pulse 81. She verbalized understanding. She reports dissociation but she was clear upon her discharge. She is coming next on December 31st . She is instructed to contact office if needing anything prior to her next treatment.     LOT # [id number]   EXP APR 2027

## 2023-04-15 NOTE — Progress Notes (Signed)
Heather Davila [id number] 1960/06/22 62 y.o.  Subjective:   Patient ID:  Heather Davila is a 62 y.o. (DOB Sep 24, 1960) female.  Chief Complaint:  Chief Complaint  Patient presents with   Follow-up   Depression   ADD   Sleeping Problem   Fatigue    HPI Heather Davila presents to the office today for follow-up of treatment resistant recurrent depression, generalized anxiety disorder and insomnia.  Almost too numerous to count med failures. She is here for Spravato administration for her treatment resistant depression.  05/21/22 appt noted: Patient was administered first dose Spravato 56 mg intranasally today.  The patient experienced the typical dissociation which gradually resolved over the 2-hour period of observation.  There were no complications.  Specifically the patient did not have nausea or vomiting or headache.   Dissociation was mild. Did not experience any emotional relief from disabling depression.wants to increase Spravato to the usual dose.  She is aware insurance is not covering the costs at this time.  No SI acutely. Tolerating meds.    05/23/22 appt noted: Patient was administered Spravato 84 mg intranasally today.  The patient experienced the typical dissociation which gradually resolved over the 2-hour period of observation.  There were no complications.  Specifically the patient did not have nausea or vomiting.   Dissociation was greater than last dose and a little bothersome DT some HA. Tolerating meds.   Mood has not changed significantly thus far with Spravato.  05/27/22 appt noted: Patient was administered Spravato 84 mg intranasally today.  The patient experienced the typical dissociation which gradually resolved over the 2-hour period of observation.  There were no complications.  Specifically the patient did not have nausea or vomiting or headache. Today she has felt a little relief from the pressing heaviness of depression but a little more anxiety.  During  administration of Spravato she listens to [place] music and was able to relax a little more with the feeling of dissociation that was mildly bothersome. Husband was also in on the session today and asked some questions about IV ketamine versus nasal spray ketamine.  05/29/22 appt noted: Cancelled today DT hypertension  05/30/22 appt noted: Patient was administered Spravato 84 mg intranasally today.  The patient experienced the typical dissociation which gradually resolved over the 2-hour period of observation.  There were no complications.  Specifically the patient did not have nausea or vomiting or headache. She is seeing some improvement in mood with less continuous depression and less intensity.  Hopefulness is better.   No med complaints or SE  06/05/22 appt noted: Patient was administered Spravato 84 mg intranasally today.  The patient experienced the typical dissociation which gradually resolved over the 2-hour period of observation.  There were no complications.  Specifically the patient did not have nausea or vomiting or headache.  Specifically HA is less of a problem with Spravato. No SE with meds. Depression is improving with less intensity.  Intermittent anxiety easily.  More able to enjoy things.  No new concerns.  06/17/22 appt noted: Patient was administered Spravato 84 mg intranasally today.  The patient experienced the typical dissociation which gradually resolved over the 2-hour period of observation.  There were no complications.  Specifically the patient did not have nausea or vomiting or headache.  Specifically HA is less of a problem with Spravato. No SE with meds. Depression is improved 45% with less intensity.  Intermittent anxiety less easily.  More able to enjoy things.  No new concerns.  More  active. Current meds: increased clonazepam to about 1.5 mg HS DT recent insomnia, lexapro 20, Dayvigo 10 mg HS, concerta 36 mg AM, trazodone 100 mg HS.  06/20/22 appt noted: Current  meds: increased clonazepam to about 1.5 mg HS DT recent insomnia, lexapro 20, Dayvigo 10 mg HS, concerta 36 mg AM, trazodone 100 mg HS. Patient was administered Spravato 84 mg intranasally today.  The patient experienced the typical dissociation which gradually resolved over the 2-hour period of observation.  There were no complications.  Specifically the patient did not have nausea or vomiting or headache.  Specifically HA is less of a problem with Spravato. No SE with meds. Depression is improved 45% with less intensity.  Intermittent anxiety less easily.  More able to enjoy things.  No new concerns.  More active.  06/23/22 appt noted:  Current meds: increased clonazepam to about 1.5 mg HS DT recent insomnia, lexapro 20, Dayvigo 10 mg HS, concerta 36 mg AM, trazodone 100 mg HS. Patient was administered Spravato 84 mg intranasally today.  The patient experienced the typical dissociation which gradually resolved over the 2-hour period of observation.  There were no complications.  Specifically the patient did not have nausea or vomiting or headache.  Specifically HA is less of a problem with Spravato. No SE with meds. Intensity of Spravato dissociation varies.  Last week very intesne and this week more typical.  Depression is continuing to improve with better interest and activity and motivation.  Gradual improvement.  Wants to continue.  06/25/22 appt noted: Current meds: increased clonazepam to about 1.5 mg HS DT recent insomnia, lexapro 20, Dayvigo 10 mg HS, concerta 36 mg AM, trazodone 100 mg HS. Patient was administered Spravato 84 mg intranasally today.  The patient experienced the typical dissociation which gradually resolved over the 2-hour period of observation.  There were no complications.  Specifically the patient did not have nausea or vomiting or headache.  Specifically HA is less of a problem with Spravato. No SE with meds. Intensity of Spravato dissociation varies.  No scary and well  tolerated. Continues to gradually improve with Spravato.  She is more motivated and enjoying things more.  H sees progress.  Wants to continue twice weekly until gets maximum response.  Dep is not gone yet.  06/30/22 appt noted:  seen with H Current meds: increased clonazepam to about 1.5 mg HS DT recent insomnia, lexapro 20, Dayvigo 10 mg HS, concerta 36 mg AM, trazodone 100 mg HS. Patient was administered Spravato 84 mg intranasally today.  The patient experienced the typical dissociation which gradually resolved over the 2-hour period of observation.  There were no complications.  Specifically the patient did not have nausea or vomiting or headache.  Specifically HA is less of a problem with Spravato. No SE with meds. Intensity of Spravato dissociation varies.  No scary and well tolerated. Continues to gradually improve with Spravato.  She is more motivated and enjoying things more.  H sees even more progress than she does; more active and smiling.  Wants to continue twice weekly until gets maximum response.  Dep is 80% better.  07/07/22 appt noted: Current meds: increased clonazepam to about 1.5 mg HS DT recent insomnia, lexapro 20, Dayvigo 10 mg HS, concerta 36 mg AM, trazodone 100 mg HS. Patient was administered Spravato 84 mg intranasally today.  The patient experienced the typical dissociation which gradually resolved over the 2-hour period of observation.  There were no complications.  Specifically the patient did not have  nausea or vomiting or headache.  Specifically HA is less of a problem with Spravato. No SE with meds. Intensity of Spravato dissociation varies.  No scary and well tolerated.  Was more intese today. Overall mood is markedly better and please with meds. No changes desired.  07/09/22 appt noted: In transition from Lexapro to sertraline and missing Concerta bc CO crash from it after about 4 hours.  Disc these concerns.  She'll discuss also with Corie Chiquito, NP More dep the  last couple of days and concerned about reducing Spravato frequency while transition from one antidep to another.  Affect less positive. Patient was administered Spravato 84 mg intranasally today.  The patient experienced the typical dissociation which gradually resolved over the 2-hour period of observation.  There were no complications.  Specifically the patient did not have nausea or vomiting or headache.  Specifically HA is less of a problem with Spravato. No SE with meds. Intensity of Spravato dissociation varies.  No scary and well tolerated.   07/14/22 appt noted: Has been more depressed this week.  More trouble with sleep.  Taking clonazepam 1-1.5  of the 0.5 mg tablets.   Trazodone not helping much. She switched back from 200 sertraline to Lexapro 20.  No SE More trouble with motivation.  Some stress with son with special needs. Misses the benevit of Concerta but it was too short acting and crashed in 4-6 hours. Patient was administered Spravato 84 mg intranasally today.  The patient experienced the typical dissociation which gradually resolved over the 2-hour period of observation.  There were no complications.  Specifically the patient did not have nausea or vomiting or headache.  Specifically HA is less of a problem with Spravato. No SE with meds. Intensity of Spravato dissociation varies.  No scary and well tolerated.   07/17/22 appt noted: Patient was administered Spravato 84 mg intranasally today.  The patient experienced the typical dissociation which gradually resolved over the 2-hour period of observation.  There were no complications.  Specifically the patient did not have nausea or vomiting or headache.  Specifically HA is less of a problem with Spravato. No SE with meds. Intensity of Spravato dissociation varies.  Not scary and well tolerated.  Just got Jornay last night but wasn't sure how to take it.  Wants to start and this was discussed.  She felt MPH helped mood and attn but  didn't last long enough and had crash after a few hours on Concerta.  Better for 2 hours after each dose Ritalin 20 mg with mood but then it wore off.  BP up after 3rd dose 160/90 and then took H's amlodipine 5 and BP became normal.  07/22/22 appt noted: Patient was administered Spravato 84 mg intranasally today.  The patient experienced the typical dissociation which gradually resolved over the 2-hour period of observation.  There were no complications.  Specifically the patient did not have nausea or vomiting or headache.  Specifically HA is less of a problem with Spravato. No SE with meds. Intensity of Spravato dissociation varies.  Not scary and well tolerated.  Start Jornay 20 mg over the weekend and did not notice any particular effect good or bad.  Increased to 40 mg. Other psych meds: Clonazepam 0.5 mg tablets 2 nightly, gabapentin dosage varies, Dayvigo 10 mg nightly, Lexapro 20 mg daily, to trazodone 100 mg nightly  07/24/22 appt noted: Patient was administered Spravato 84 mg intranasally today.  The patient experienced the typical dissociation which gradually resolved  over the 2-hour period of observation.  There were no complications.  Specifically the patient did not have nausea or vomiting or headache.  Specifically HA is less of a problem with Spravato. No SE with meds. Intensity of Spravato dissociation varies.  Not scary and well tolerated.  Other psych meds: Clonazepam 0.5 mg tablets 2 nightly, gabapentin dosage varies, Dayvigo 10 mg nightly, Lexapro 20 mg daily, to trazodone 100 mg nightly, Jornay 40 mg PM Doing well with Spravato.  Noticed a little effect from the Jornay 40 without SE but would like to increase the dose for ADD and mood.  Sleeping well.  No new concerns.  Still more depressed than she was with th initial benefit of Spravato.  07/30/22 note:  Patient was administered Spravato 84 mg intranasally today.  The patient experienced the typical dissociation which gradually  resolved over the 2-hour period of observation.  There were no complications.  Specifically the patient did not have nausea or vomiting or headache.  Specifically HA is less of a problem with Spravato. Other psych meds: Clonazepam 0.5 mg tablets 2 nightly, gabapentin dosage varies, Dayvigo 10 mg nightly, Lexapro 20 mg daily, to trazodone 100 mg nightly, Jornay 60 mg PM Wants to increase Jornay to 80 mg PM to increase benefit for ADD and depression. No SE She has experienced a major family crisis that threatens to change her daily life from now on.  It has not triggered suicidal thoughts at this time but she is very afraid.  No desire to change medications.  08/04/22 appt noted" Patient was administered Spravato 84 mg intranasally today.  The patient experienced the typical dissociation which gradually resolved over the 2-hour period of observation.  There were no complications.  Specifically the patient did not have nausea or vomiting or headache.  Specifically HA is less of a problem with Spravato. Other psych meds: Clonazepam 0.5 mg tablets 2 nightly, gabapentin dosage varies, Dayvigo 10 mg nightly, Lexapro 20 mg daily, trazodone 100 mg nightly, Jornay 80 mg PM No SE No benefit or SE with increase Jormay 80 mg.  Says Concerta helped ADD and depression but not Jornay.  However duration was insufficient.  Still depressed and wants change to another form of stimulant.  No concerns with other meds. Continues with strong family stressors creating uncertainty about her future but no quick resolution. No SI Trial Cotempla 17.3 and DC Ritalin & Jornay 80  08/11/2022 appointment noted: Patient was administered Spravato 84 mg intranasally today.  The patient experienced the typical dissociation which gradually resolved over the 2-hour period of observation.  There were no complications.  Specifically the patient did not have nausea or vomiting or headache.  Specifically HA is less of a problem with Spravato now  vs when started it.. Other psych meds: Clonazepam 0.5 mg tablets 2 nightly, gabapentin dosage varies, Dayvigo 10 mg nightly, trazodone 100 mg nightly, cotempla 17.3, switch from Lexapro 20 to sertaline 15o mg . No noticeable effects sertraline from for depression but has seen some antianxiety effects from the sertraline.  No side effects noted.  No side effects with the other medicines.  Still is only getting very brief benefit 3 to 4 hours from Cotempla for ADD and mood.  She wants to try the Daytrana patch as we have discussed before. No SE  08/13/22 appt: Patient was administered Spravato 84 mg intranasally today.  The patient experienced the typical dissociation which gradually resolved over the 2-hour period of observation.  There were no  complications.  Specifically the patient did not have nausea or vomiting or headache.  Specifically HA is less of a problem with Spravato now vs when started it.. Other psych meds: Clonazepam 0.5 mg tablets 2 nightly, gabapentin dosage varies, Dayvigo 10 mg nightly, trazodone 100 mg nightly, cotempla 17.3, switch from Lexapro 20 to sertaline 15o mg . No noticeable effects sertraline from for depression but has seen some antianxiety effects from the sertraline.  No side effects noted.  No side effects with the other medicines.  Still is only getting very brief benefit 3 to 4 hours from Cotempla for ADD and mood.  She wants to try the Daytrana patch as we have discussed before.  Hasn't been able to get it yet. No SE Plan.  Continue to try to get Daytrana 30 AM  08/18/22 appt noted: Patient was administered Spravato 84 mg intranasally today.  The patient experienced the typical dissociation which gradually resolved over the 2-hour period of observation.  There were no complications.  Specifically the patient did not have nausea or vomiting or headache.  Specifically HA is less of a problem with Spravato now vs when started it.. Other psych meds: Clonazepam 0.5 mg  tablets 2 nightly, gabapentin dosage varies, Dayvigo 10 mg nightly, trazodone 100 mg nightly, cotempla 17.3, switch from Lexapro 20 to sertaline 15o mg . No noticeable effects sertraline from for depression but has seen some antianxiety effects from the sertraline.  No side effects noted.  No side effects with the other medicines.  Still is only getting very brief benefit 3 to 4 hours from Cotempla for ADD and mood.  She wants to try the Daytrana patch as we have discussed before.  Hasn't been able to get it yet.  Overall depression is some better than it was.   No SE Plan.  Continue to try to get Daytrana 30 AM  08/25/22 appt : No benefit Daytrana.  No SE.  Wants further med changes and interested In retrying pramipexole.   Tolerating meds. Patient was administered Spravato 84 mg intranasally today.  The patient experienced the typical dissociation which gradually resolved over the 2-hour period of observation.  There were no complications.  Specifically the patient did not have nausea or vomiting or headache.  Specifically HA is less of a problem with Spravato now vs when started it.. Other psych meds: Clonazepam 0.5 mg tablets 2 nightly, gabapentin dosage varies, Dayvigo 10 mg nightly, trazodone 100 mg nightly, , switch from Lexapro 20 to sertaline 15o mg . Daytrana 30 AM for a few days. Struggles with depression and no SI.  Reduced interest and mood and motivation and enjoyment and socialization.  08/27/22 appt noted: Psych meds:  Clonazepam 0.5 mg tablets 2 nightly, gabapentin dosage varies, Dayvigo 10 mg nightly, trazodone 100 mg nightly,  sertaline 15o mg . Returned to [employer] AM, pramipexole  Tolerating meds. Patient was administered Spravato 84 mg intranasally today.  The patient experienced the typical dissociation which gradually resolved over the 2-hour period of observation.  There were no complications.  Specifically the patient did not have nausea or vomiting or headache.   Reports  taking cotempla bc brief mood benefit and sig focus and productivity benefit. Took 1 dose pramipexole and felt more dep.  09/01/22 appt noted: Psych meds:  Clonazepam 0.5 mg tablets 2 nightly, gabapentin dosage varies, Dayvigo 10 mg nightly, trazodone 100 mg nightly,  sertaline 15o mg . Returned to Cotempla AM, pramipexole  0.25 mg BID. Tolerating meds now. Patient  was administered Spravato 84 mg intranasally today.  The patient experienced the typical dissociation which gradually resolved over the 2-hour period of observation.  There were no complications.  Specifically the patient did not have nausea or vomiting or headache.   Willing to try the pramipexole and will continue 0.25 mg BID this week and then consider increase if tolerated.  Mood is a little better.  Still looking for further improvement.  09/03/22 appt noted: Psych meds:  Clonazepam 0.5 mg tablets 2 nightly, gabapentin dosage varies, Dayvigo 10 mg nightly, trazodone 100 mg nightly,  sertaline 15o mg . Returned to Cotempla AM, pramipexole  0.25 mg tablet 1 and 1/2 tablets twice daily BID. Tolerating meds now. Patient was administered Spravato 84 mg intranasally today.  The patient experienced the typical dissociation which gradually resolved over the 2-hour period of observation.  There were no complications.  Specifically the patient did not have nausea or vomiting or headache.   Depression may be a little better with the parmipexole and is tolerating it well so far.  Still struggling with focus and residual depression.  Tolerating meds without SE.  More hopeful and able to enjoy some things.  09/08/22 appt  Psych meds:  Clonazepam 0.5 mg tablets 2 nightly, gabapentin dosage varies, Dayvigo 10 mg nightly, trazodone 100 mg nightly,  sertaline 15o mg . Returned to Cotempla AM, pramipexole  0.5 mg BID. Tolerating meds now. Patient was administered Spravato 84 mg intranasally today.  The patient experienced the typical dissociation which  gradually resolved over the 2-hour period of observation.  There were no complications.  Specifically the patient did not have nausea or vomiting or headache.   She has seen her depression drop from a 10/10 prior to treatment with Spravato to now 4/10 With additional benefit noted when she increased the pramipexole to 0.5 mg twice daily.  She is not having side effects with this like she did in the past. Anxiety levels are also improved. She is sleeping okay with the current medications. Plan: continue pramipexole trial off-label and increase more slowly for TRD.  Increase  Gradually  to 0.75 mg BID  09/11/22 appt noted: Psych meds:  Clonazepam 0.5 mg tablets 2 nightly, gabapentin dosage varies, Dayvigo 10 mg nightly, trazodone 100 mg nightly,  sertaline 15o mg . Returned to Cotempla AM, pramipexole  0.75 mg BID. Tolerating meds now. Patient was administered Spravato 84 mg intranasally today.  The patient experienced the typical dissociation which gradually resolved over the 2-hour period of observation.  There were no complications.  Specifically the patient did not have nausea or vomiting or headache.   She has seen her depression drop from a 10/10 prior to treatment with Spravato to now 4/10 With additional benefit noted when she increased the pramipexole to 0.5 mg twice daily.  She is not having side effects with this like she did in the past. Clearly improving with the pramipexole now and tolerating it.  Satisfied with current meds but would consider increasing if it might be helpful.  09/15/22 appt noted: Psych meds:  Clonazepam 0.5 mg tablets 2 nightly, gabapentin dosage varies, Dayvigo 10 mg nightly, trazodone 100 mg nightly,  sertaline 15o mg . Returned to Cotempla AM, pramipexole  0.75 mg BID too anxious and reduced to 0.5 mg BID. Tolerating meds now. Patient was administered Spravato 84 mg intranasally today.  The patient experienced the typical dissociation which gradually resolved over  the 2-hour period of observation.  There were no complications.  Specifically  the patient did not have nausea or vomiting or headache.   Trouble staying asleep longer than 6 hours.  Doesn't feel she can tolerate pramipexole 0.75 mg BID DT anxiety not experienced at  0.5 mg BID.  Otherwise tolerating meds.  Mood is better with Spravato and pramipexole.   Not sleeping as long with meds.  EMA 6 hours.  Wonders about change Disc concerns about waiting for therapy. Feels ready to reduce Spravato to weekly.    09/25/22 appt noted: Psych meds:  Clonazepam 0.5 mg tablets 2 nightly, gabapentin dosage varies, Dayvigo 10 mg nightly, trazodone 100 mg nightly,  sertaline 15o mg . Returned to Cotempla AM, pramipexole  0.75 mg BID too anxious and reduced to 0.5 mg BID. Tolerating meds now. Patient was administered Spravato 84 mg intranasally today.  The patient experienced the typical dissociation which gradually resolved over the 2-hour period of observation.  There were no complications.  Specifically the patient did not have nausea or vomiting or headache.   Trouble staying asleep longer than 6 hours.  Failed to respond to Seroquel 25 mg HS.  Gone back to [facility].  Belsomra NR. Mood ok until the last few days more dep bc it's been 10 days since last admin.  Wants to continue weekly.   No SE.  No med changes desired.  09/29/22 appt noted: Psych meds:  Clonazepam 0.5 mg tablets 2 nightly, gabapentin dosage varies, Dayvigo 10 mg nightly, trazodone 100 mg nightly,  sertaline 15o mg . Returned to Cotempla AM, pramipexole  0.75 mg BID too anxious and reduced to 0.5 mg BID. Tolerating meds now. Patient was administered Spravato 84 mg intranasally today.  The patient experienced the typical dissociation which gradually resolved over the 2-hour period of observation.  There were no complications.  Specifically the patient did not have nausea or vomiting or headache.   Trouble staying asleep longer than 6 hours.  Plan:  Continue sertaline to 200 mg daily.  It has helped anxiety but not depression.   for insomnia, clonazepam 1 mg HS. trazodone doesn't work well but helps some Continue Dayvigo 10 HS.  Failed resp Belsomra 15 and seroquel 25 She wants to continue Cotempla or Concerta bc helps mood early in day and focus  benefit lasts longer.  Have not been able to get stimulant to be consitently effective throughout the day. continue pramipexole trial off-label and reduce back to  TRD 0.5 mg BID Spravato weekly.  10/13/22 appt noted: Not sleeping as well.  Wants to increase Spravato to twice weekly bc feeling more dep close to the next sched date.  Just the day or 2 before.  .  Plan: Trial for off label TRD increase pramipexole 1 mg TID  10/20/22 appt noted: Psych meds:  Clonazepam 0.5 mg tablets 2 nightly, gabapentin dosage varies, Dayvigo 10 mg nightly, trazodone 100 mg nightly,  sertaline 15o mg . Returned to Cotempla AM, pramipexole  1 mg TID. Tolerating meds now. Patient was administered Spravato 84 mg intranasally today.  The patient experienced the typical dissociation which gradually resolved over the 2-hour period of observation.  There were no complications.  Specifically the patient did not have nausea or vomiting or headache.   Trouble staying asleep longer than 6 hours.  More dep this week and asked to get Spravato twice this week. Plan: Trial for off label TRD increase pramipexole 1 mg QID  10/22/22 appt noted:  Psych meds:  Clonazepam 0.5 mg tablets 2 nightly and another one with  EMA, gabapentin dosage varies, Belsomra 20 mg daily,  trazodone 100 mg nightly,  sertaline 15o mg . Returned to Cotempla AM, pramipexole  1 mg QID for 2nd day Tolerating meds now.  Except a little N and dizzy briefly after takes it. Patient was administered Spravato 84 mg intranasally today.  The patient experienced the typical dissociation which gradually resolved over the 2-hour period of observation.  There were no  complications.  Specifically the patient did not have nausea or vomiting or headache. No change in mood yet.   Sleep better with Belsomra 20 as long as takes with other meds.  If insomnia mood is worse. Ongoing some dep but gets partial relief with Spravato but would like better relief .  10/27/22 received Spravato 84  11/03/22 appt noted:  Meds as above except sertraline she cut to 100 mg instead of 200 mg daily She doesn't think sertraline helping and worries over poss SE Off and on Cotempla .  Not affecting BP. Doesn't notice benefit or SE with pramipexole 1 mg QID.  No SE Sleep is OK.  Some awakening aftter 6 hours but not enough so takes another clonazepam.   Patient was administered Spravato 84 mg intranasally today.  The patient experienced the typical dissociation which gradually resolved over the 2-hour period of observation.  There were no complications.  Specifically the patient did not have nausea or vomiting or headache. No change in mood yet.   Sleep better with Belsomra 20 as long as takes with other meds.  If insomnia mood is worse. Ongoing some dep but gets partial relief with Spravato but would like better relief .  No sig benefit with pramipexole 1 mg QID.  11/21/22 TC:  To ER with dep 11/21/22. No SE with selegiline but feels more dep.  Wants to stop it. Today is only day 2 of 1 tablet of selegiline 5 mg AM.  Reviewed with her that she had not been satisfied with the response of Spravato and Zoloft and that she is likely feeling worse because the Zoloft is wearing off and in particular because she abruptly stopped 150 mg daily.  She needs to give the selegiline time to work.  She agrees to do so.  She admitted to some passive suicidal thoughts but no active suicidal thoughts and no desire to die.  She agrees to the plan to increase selegiline to 10 mg every morning this weekend and to continue the selegiline trial.  She cannot stop this and immediately start another AD due to risk  DDI with serotonin syndrome.  She is aware.  Meredith Staggers, MD, DFAPA  12/19/22 TC: Pt called at 1:42p.  She said she wanted to know if she could start weaning off the Selegiline.  She thinks increasing the dosage is making things worse.  She said she wants to go back to [employer].  She acknowleged she has an appt on Monday. I advised her that I wasn't sure Dr Jennelle Human will see her message before her appt, but she still ask me to send the message.  Next appt 8/26 MD resp.  No change until Monday appt.  12/22/22 appt noted: Meds: selegiline up to 5 mg 4 daily for a week but felt it incr irritability and went back to 3 mg daily. She wants to go back to Spravato.  Not near as dep as now.  Selegiline didn't help energy. Still on Belsomra Thinks maybe sertraline helped mood more than she thought. More dep than anxiety.  Plan: DC selegiline and will retry Effexor which helped in the past at 300 mg daily.  Start this Friday after over 10 half lives of selegiline.    12/24/22 appt noted: Stopped selegiline  Psych med: belsomra 20, clonazepam 1 mg HS.   She has felt more dep off the Spravato though at the time didn't think it was helping.  Has felt more down and withdrawn.  Low energy, motivation, not smiling.  No SI.  Sleep ok.   Patient was administered Spravato 84 mg intranasally today.  The patient experienced the typical dissociation which gradually resolved over the 2-hour period of observation.  There were no complications.  Specifically the patient did not have nausea or vomiting or headache. No problems with BP or other unusual SE Mood is a little better after this administration of Spravato.  Very motivated to continue it.  No immediate med concerns. No SI .  Ongoing anhedonia.  12/25/22 appt noted: Psych med: belsomra 20, clonazepam 1 mg HS.  Not resumed stimulant or AD yet   No SE. Patient was administered Spravato 84 mg intranasally today.  The patient experienced the typical dissociation  which gradually resolved over the 2-hour period of observation.  There were no complications.  Specifically the patient did not have nausea or vomiting or headache.  There is a lot things going on in her life and it is somewhat difficult to separate stressors from what is going on with her mood.  However she is happy to resume Spravato as she decided after stopping briefly that it was helpful.  She plans to start the antidepressant Morrow.  She also wants to resume the stimulant tomorrow.  12/30/22 appt noted; Psych med: belsomra 20, clonazepam 1 mg HS.  Not resumed stimulant.  Effexor XR 37.5 mg daily   No SE. Patient was administered Spravato 84 mg intranasally today.  The patient experienced the typical dissociation which gradually resolved over the 2-hour period of observation.  There were no complications.  Specifically the patient did not have nausea or vomiting or headache.  She is still having depression with very prominent anhedonia.  However she feels the Spravato is somewhat helpful and is hopeful for more complete response as she gets back onto a reasonable antidepressant dose.  She tolerated Effexor XR 300 mg daily in the past and took it for 5 months and it is probably the antidepressants worked best for her.  She is agreeable with this plan.  She is getting sleep benefit from the current medications Plan: incr Effexor 75 mg daily  01/02/23 appt noted: Psych meds as noted above except increase Effexor XR to 75 mg daily without side effects. Patient was administered Spravato 84 mg intranasally today.  The patient experienced the typical dissociation which gradually resolved over the 2-hour period of observation.  There were no complications.  Specifically the patient did not have nausea or vomiting or headache.  Agrees to increasing Effexor as planned.  SX not changed from that noted above.  No SI  01/05/23 appt noted: Psych meds as noted above except increase Effexor XR to 112.5 mg daily  without side effects. Patient was administered Spravato 84 mg intranasally today.  The patient experienced the typical dissociation which gradually resolved over the 2-hour period of observation.  There were no complications.  Specifically the patient did not have nausea or vomiting or headache.  Agrees to increasing Effexor as planned gradually to 300 mg daily.  SX not changed from that noted above.  Does however feel Spravato has dampened the degree of depression.  No SI Stressful life event discussed and won't be resolved until early next year.  01/07/23 appt noted: Psych meds Effexor XR 112.5 mg daily. Patient was administered Spravato 84 mg intranasally today.  The patient experienced the typical dissociation which gradually resolved over the 2-hour period of observation.  There were no complications.  Specifically the patient did not have nausea or vomiting or headache.  Agrees to increasing Effexor as planned gradually to 300 mg daily.  SX not changed from that noted above.  Does however feel Spravato has dampened the degree of depression markedly compared to when off psych meds and no Spravato.  No SI  01/12/23 appt noted; Psych meds Effexor XR 225 mg daily. No SE notedl. Patient was administered Spravato 84 mg intranasally today.  The patient experienced the typical dissociation which gradually resolved over the 2-hour period of observation.  There were no complications.  Specifically the patient did not have nausea or vomiting.  But is having headache.  Agrees to increasing Effexor as planned gradually to 300 mg daily.  SX not changed from that noted above.  Does however feel Spravato has dampened the degree of depression markedly compared to when off psych meds and no Spravato.  No SI  01/15/23 appt noted:  Psych meds Effexor XR 225 mg daily. No SE noted. Patient was administered Spravato 84 mg intranasally today.  The patient experienced the typical dissociation which gradually resolved  over the 2-hour period of observation.  There were no complications.  Specifically the patient did not have nausea or vomiting.   Doing ok with higher doses of Effexor and will plan to increase to 300 mg daily. Mild improvement noticed so far with the incrase in meds.   Uses Cotempla prn and it helps but doesn't liek the way she feels when it wears off.  01/22/23 appt noted: Psych meds reduced Effexor XR 150 mg daily. Bc of HA No SE noted. Patient was administered Spravato 84 mg intranasally today.  The patient experienced the typical dissociation which gradually resolved over the 2-hour period of observation.  There were no complications.  Specifically the patient did not have nausea or vomiting.  Ongoing moderate levels of depression with some improvement from the Spravato.  She is not sure about the effect of the venlafaxine on her mood yet.  She would like to continue to try pursuing a stimulant but has had difficulty tolerating p.o. versions of stimulants because of the crash at the end and side effects with many of them.  She has several questions about this.  01/27/23 appt noted: Psych meds: Clonazepam 1 to 1.5 mg nightly, lithium CR 300 nightly, Belsomra 20 nightly, venlafaxine XR 150 every morning No side effects noted at the current dosage. Patient was administered Spravato 84 mg intranasally today.  The patient experienced the typical dissociation which gradually resolved over the 2-hour period of observation.  There were no complications.  Specifically the patient did not have nausea or vomiting.  Ongoing moderate levels of depression with some improvement from the Spravato.   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  She does not tolerate oral stimulants very well because she has a crash as they wear off which is very dysphoric.  She is willing to retry the Daytrana patches that she took in the spring.  She took 1 patch on  1 occasion recently and  felt it was helpful. However she is also having problems with early morning awakening over the last week without precipitant.  She would like to have a different treatment strategy for sleep but understands her sleep problems have historically been difficult to resolve and maintain.  02/10/23 appt noted: Psych meds: Clonazepam 1 to 1.5 mg nightly, lithium CR 300 nightly, Belsomra 20 nightly, venlafaxine XR 150 every morning, Daytrana 30 mg Am No side effects noted at the current dosage. Patient was administered Spravato 84 mg intranasally today.  The patient experienced the typical dissociation which gradually resolved over the 2-hour period of observation.  There were no complications.  Specifically the patient did not have nausea or vomiting.  Ongoing moderate levels of depression with some improvement from the Spravato.  Particularly anhedonia.  No sig benefit noted with Daytrana and problems with table stimulants bc gets brief benefit with unpleasant "crash".  Wants to try another type of patch if available.  02/17/23 appt noted: Psych meds: Clonazepam 1 to 1.5 mg nightly, lithium CR 300 nightly, Belsomra 20 nightly, venlafaxine XR 225 mg every morning, Daytrana 30 mg Am No side effects noted at the current dosage. Patient was administered Spravato 84 mg intranasally today.  The patient experienced the typical dissociation which gradually resolved over the 2-hour period of observation.  There were no complications.  Specifically the patient did not have nausea or vomiting.  Ongoing moderate levels of depression with some improvement from the Spravato.  Particularly anhedonia.  She has not gotten the new stimulant yet but is hopeful. No problems with increasing venlafaxine.   Mood is better with Spravato and venlafaxine.  Less anhedonia, less down. Themazepam not helpful.    03/04/23 received Spravato 84 03/12/23 received Spravato 84  03/19/23 appt noted: Psych meds:  Clonazepam 1 to 1.5 mg nightly, lithium CR 300 nightly, Belsomra 20 nightly, venlafaxine XR 300 mg every morning, tried various stimulants lately No side effects noted at the current dosage. Patient was administered Spravato 84 mg intranasally today.  The patient experienced the typical dissociation which gradually resolved over the 2-hour period of observation.  There were no complications.  Specifically the patient did not have nausea or vomiting.  Not getting good duration from oral stimulants.  She would like to try another patch but has to take tabs first and try them before insurance will cover patches.  Overall dep is better with meds and Spravato.  Clearly sees benefit.  03/30/23 appt noted: Psych meds: Clonazepam 1 to 1.5 mg nightly, lithium CR 300 nightly,  venlafaxine XR 300 mg every morning, tried various stimulants lately, gabapentin 900 mg HS, dexmethylphenidate ER without help No side effects noted at the current dosage. Patient was administered Spravato 84 mg intranasally today.  The patient experienced the typical dissociation which gradually resolved over the 2-hour period of observation.  There were no complications.  Specifically the patient did not have nausea or vomiting.  Able to leave at end of 2 hour period without assistance.  Clear mood benefit with Spravato with much less dep and wants to continue. Still trouble sleeping. TR insomnia.  Ongoing conc problems with minimal brief benefit from stimulants.  Not able to get patch Caprice Red yet DT insurance.  04/14/23 appt noted: Psych meds: Clonazepam 1 to 1.5 mg nightly, lithium CR 300 nightly,  she reduced on her own venlafaxine XR to 150 mg every morning, tried various stimulants lately, gabapentin 900 mg HS, dexmethylphenidate ER without help No side effects noted at the  current dosage. Patient was administered Spravato 84 mg intranasally today.  The patient experienced the typical dissociation which gradually resolved over the  2-hour period of observation.  There were no complications.  Specifically the patient did not have nausea or vomiting.  Able to leave at end of 2 hour period without assistance.  Clear mood benefit with Spravato with much less dep and wants to continue. she went to [facility] and had her brain scanned at [facility]. they recommend she taper off Effexor which she is only taking 150 mg currently and start Auvelity. She reduce Effexor on her own to 150 mg daily. She wants further improvement in anhedonia.    AIMS    Flowsheet Row Video Visit from 01/03/2022 in [facility]  AIMS Total Score 0      ECT-MADRS    Flowsheet Row Clinical Support from 08/13/2022 in [facility] Video Visit from 04/29/2022 in [facility] Video Visit from 02/06/2022 in [facility]  MADRS Total Score 27 44 23      GAD-7    Flowsheet Row Counselor from 12/19/2022 in [facility] Office Visit from 08/22/2022 in [facility] [place]  Total GAD-7 Score 4 7      PHQ2-9    Flowsheet Row Counselor from 12/19/2022 in [facility] Office Visit from 08/22/2022 in [facility] [place] Office Visit from 08/19/2022 in [facility] for [facility] at [facility] Video Visit from 02/06/2022 in [facility] Office Visit from 11/16/2020 in [facility] Total Score 2 3 0 6 0  PHQ-9 Total Score 5 5 -- 14 --        Past Psychiatric Medication Trials: Trintellix - Initially effective and then was not effective when re-started Paxil- Ineffective. Helped initially. Prozac-Insomnia Lexapro- Effective and then no longer as effective Zoloft- Initially effective and then minimal response Viibryd Cymbalta- Ineffective. Helped  initially. Effexor XR 300- Effective, well tolerated. Pristiq- Increased anxiety at 150 mg daily Wellbutrin XL-Ineffective.May have increased anxiety. Amitriptyline Nortriptyline-Caused irritability, constipation Auvelity- ineffective Selegilne brief 20 mg daily , more irritable  Abilify- Helpful but caused severe insomnia. Rexulti-Helpful but caused insomnia. Vraylar Seroquel - Ineffective Risperdal Olanzapine- Ineffective Latuda- Ineffective Caplyta  Klonopin- Effective Temazepam- Took during menopause Lunesta- Ineffective Ambien-Ineffective Sonata- Ineffective Dayvigo- partially effective  Belsomra 20 worked and then failed  Trazodone-  somewhat effective Remeron-Ineffective. Does not recall wt gain.  Quetiapine 25 NR  Adderall- Took once and had adverse effect. Concerta- Effective but only 4-6 hours Jornay 80 NR Metadate-Not as effective as Concerta Dexmethylphenidate ER 10/02/21- Not as effective as Concerta Azstarys- some side effects. Not as effective.  Ritalin short acting and SE anxiety & HTN @20  TID Cotempla 17.3  Daytrana NR  Lamictal- May have had an adverse effects. "Felt weird." Reports worsening s/s.  Lithium ? effect Gabapentin Pramipexole- 1 mg QID NR  ECT #20 with minimal response. H says it was partly helpful.  AIMS    Flowsheet Row Video Visit from 01/03/2022 in [facility]  AIMS Total Score 0      ECT-MADRS    Flowsheet Row Clinical Support from 08/13/2022 in [facility] Video Visit from 04/29/2022 in [facility] Video Visit from 02/06/2022 in [facility]  MADRS  Total Score 27 44 23      GAD-7    Flowsheet Row Counselor from 12/19/2022 in [facility] Office Visit from 08/22/2022 in [facility] [place]  Total GAD-7 Score 4 7      PHQ2-9    Flowsheet Row Counselor from  12/19/2022 in [facility] Office Visit from 08/22/2022 in [facility] [place] Office Visit from 08/19/2022 in [facility] for [facility] Video Visit from 02/06/2022 in [facility] Office Visit from 11/16/2020 in [facility] for [facility] Healthcare at [facility]  PHQ-2 Total Score 2 3 0 6 0  PHQ-9 Total Score 5 5 -- 14 --        Review of Systems:  Review of Systems  Constitutional:  Positive for fatigue.  Cardiovascular:  Negative for chest pain and palpitations.  Neurological:  Negative for dizziness, tremors and headaches.  Psychiatric/Behavioral:  Positive for dysphoric mood and sleep disturbance. Negative for agitation, confusion, decreased concentration, hallucinations and suicidal ideas. The patient is nervous/anxious.     Medications: I have reviewed the patient's current medications.  Current Outpatient Medications  Medication Sig Dispense Refill   clonazePAM (KLONOPIN) 1 MG tablet Take 1-2 tablets (1-2 mg total) by mouth at bedtime. 60 tablet 0   Dexmethylphenidate HCl 40 MG CP24 Take 1 capsule (40 mg total) by mouth every morning. 15 capsule 0   Esketamine HCl, 84 MG Dose, (SPRAVATO, 84 MG DOSE,) 28 MG/DEVICE SOPK Place 84 mg into the nose once a week. 3 each 1   Estradiol (VAGIFEM) 10 MCG TABS vaginal tablet Place 1 tablet (10 mcg total) vaginally 2 (two) times a week. 24 tablet 3   lithium carbonate (LITHOBID) 300 MG ER tablet Take 2 tablets (600 mg total) by mouth at bedtime. 180 tablet 0   traZODone (DESYREL) 50 MG tablet Take 1 tablet (50 mg total) by mouth at bedtime. 30 tablet 0   valACYclovir (VALTREX) 500 MG tablet Take one twice daily for 3 days with any symptoms 30 tablet 3   valsartan (DIOVAN) 80 MG tablet Take 1 tablet (80 mg total) by mouth daily. 90 tablet 3   venlafaxine XR (EFFEXOR-XR) 150 MG 24 hr capsule Take 1 capsule (150 mg total)  by mouth 2 (two) times daily. (Patient taking differently: Take 150 mg by mouth daily with breakfast.) 60 capsule 0   Dextroamphetamine (XELSTRYM) 13.5 MG/9HR PTCH Place 1 patch onto the skin every morning. (Patient not taking: Reported on 04/15/2023) 30 patch 0   No current facility-administered medications for this visit.    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   TRANSAMINASES, SERUM, ELEVATED 08/14/2009    Past Medical History, Surgical history, Social history, and Family history were reviewed and updated as appropriate.   Please see review of systems for further details on the patient's review from today.   Objective:   Physical Exam:  LMP 12/27/2007 (Exact Date)   Physical Exam Constitutional:      General: She  is not in acute distress. Musculoskeletal:        General: No deformity.  Neurological:     Mental Status: She is alert and oriented to person, place, and time.     Coordination: Coordination normal.  Psychiatric:        Attention and Perception: Perception normal. She is inattentive. She does not perceive auditory hallucinations.        Mood and Affect: Mood is anxious and depressed. Affect is blunt. Affect is not tearful.        Speech: Speech normal.        Behavior: Behavior normal.        Thought Content: Thought content normal. Thought content is not delusional. Thought content does not include homicidal or suicidal ideation. Thought content does not include suicidal plan.        Cognition and Memory: Cognition and memory normal.        Judgment: Judgment normal.     Comments: Insight intact 4/10 dep but  better with Spravato.  She is doing better overall. Affect blunted but with a little more expression      Lab Review:     Component Value Date/Time   NA 132 (L) 10/27/2022 1107   K 4.4 10/27/2022 1107   CL 95 (L) 10/27/2022 1107   CO2 30 10/27/2022 1107   GLUCOSE 89 10/27/2022 1107   BUN 14 10/27/2022 1107   CREATININE 0.67 10/27/2022 1107   CREATININE 0.68 01/26/2020 0920   CALCIUM 10.1 10/27/2022 1107   PROT 8.3 04/22/2022 0932   ALBUMIN 5.3 (H) 04/22/2022 0932   AST 82 (H) 04/22/2022 0932   ALT 118 (H) 04/22/2022 0932   ALKPHOS 74 04/22/2022 0932   BILITOT 0.3 04/22/2022 0932   GFRNONAA >90 05/01/2014 1645   GFRAA >90 05/01/2014 1645       Component Value Date/Time   WBC 3.3 (L) 04/22/2022 0932   RBC 4.25 04/22/2022 0932   HGB 14.3 04/22/2022 0932   HGB 14.1 10/26/2014 1034   HCT 40.7 04/22/2022 0932   PLT 245.0 04/22/2022 0932   MCV 95.7 04/22/2022 0932   MCH 34.4 (H) 01/26/2020 0920   MCHC 35.1 04/22/2022 0932   RDW 13.2 04/22/2022 0932   LYMPHSABS 0.9 04/22/2022 0932   MONOABS 0.2 04/22/2022 0932   EOSABS 0.1 04/22/2022 0932   BASOSABS 0.0 04/22/2022 0932    No results found for: "POCLITH", "LITHIUM"   No results found for: "PHENYTOIN", "PHENOBARB", "VALPROATE", "CBMZ"   .res Assessment: Plan:    There are no diagnoses linked to this encounter.   Pt seen for TRD with multiple failed medications as noted above.    Mood was reportedly better with venlafaxine XR 300 with Spravato but ongoing dep with anhedonia and fatigue.  Then she went to the [place] [facility] and they rec Auvelity and she reduced venlafaxine on her own to 150 mg .  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  Apparently she did not remember that when she went to the [place] [facility].  There is an ongoing pattern of her making impulsive decisions on her own about meds which are counter therapeutic and not  informing MD of what is going on until after th fact.  That is not a helpful pattern and she is encouraged to stop it.  Patient was administered Spravato 84 mg intranasally today.  The patient experienced the typical dissociation which gradually  resolved over the 2-hour period of observation.  There were no complications.  Specifically the patient did not have nausea or vomiting or headache.  Blood pressures remained within normal ranges at the 40-minute and 2-hour follow-up intervals.  By the time the 2-hour observation period was met the patient was alert and oriented and able to exit without assistance.  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  See nursing note for further details.  Consider switch Spravato to MAOI, Parnate  We discussed the short-term risks associated with benzodiazepines including sedation and increased fall risk among others.  Discussed long-term side effect risk including dependence, potential withdrawal symptoms, and the potential eventual dose-related risk of dementia.  But recent studies from 2020 dispute this association between benzodiazepines and dementia risk. Newer studies in 2020 do not support an association with dementia.  for insomnia, wants to continue clonazepam 2 mg nightly.  Cotempla or Concerta bc helps mood early in day and focus  benefit lasts longer.  Have not been able to get stimulant to be consitently effective throughout the day. Conisder switching to patch bc pt has significant crash with stimulants or perhaps [place].  Stimulant tablets cause crash with inadequate duration .  Patches should provide smoother response.  Will try another patch, Noemi Chapel if we can get it approved.   Insurance denied PA for this medication stating she has to try dextroamphetamine ER, Adderall XR, and Vyvanse before will be considered. Dexmethylphenidate ER 10/02/21- Not as effective as  Concerta Have been to try Adderall XR because she did not tolerate Adderall at home I am reluctant to prescribe Vyvanse for the same reason but we can try a low dose of Vyvanse in hopes of avoiding side effects she had with Adderall.  Discussed potential benefits, risks, and side effects of stimulants with patient to include increased heart rate, palpitations, insomnia, increased anxiety, increased irritability, or decreased appetite.  Instructed patient to contact office if experiencing any significant tolerability issues.  Dont' decrease venlafaxine further from 150 mg whild documents from [facility] are reviewed.  Disc SE in detail and SSRI withdrawal sx.  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  Disc pretreating Spravato with NSAID or Tylenol bc of HA lately.  trazodone  50 mg HS  Supportive therapy dealing with serious stressful life event.  FU twice weekly Spravato until next week then try to drop back to weekly .  Meredith Staggers, MD, DFAPA    Please see After Visit Summary for patient specific instructions.  Future Appointments  Date Time Provider Department Center  05/01/2023  9:00 AM Gaspar Bidding, [facility] None  05/15/2023  9:00 AM Gaspar Bidding, [facility] CP-CP None  05/29/2023  9:00 AM Gaspar Bidding, [facility] CP-CP None  06/09/2023  9:00 AM Gaspar Bidding, [facility] CP-CP None  06/23/2023  9:00 AM Gaspar Bidding, [facility] CP-CP None  09/18/2023  8:15 AM Jerene Bears, MD [facility]    No orders of the defined types were placed in this encounter.   -------------------------------

## 2023-04-28 ENCOUNTER — Ambulatory Visit: Payer: 59

## 2023-04-28 ENCOUNTER — Encounter: Payer: Self-pay | Admitting: Physician Assistant

## 2023-04-28 ENCOUNTER — Ambulatory Visit: Payer: 59 | Admitting: Physician Assistant

## 2023-04-28 ENCOUNTER — Telehealth: Payer: Self-pay

## 2023-04-28 ENCOUNTER — Other Ambulatory Visit: Payer: Self-pay | Admitting: Psychiatry

## 2023-04-28 VITALS — BP 111/79 | HR 73

## 2023-04-28 DIAGNOSIS — F339 Major depressive disorder, recurrent, unspecified: Secondary | ICD-10-CM

## 2023-04-28 DIAGNOSIS — F900 Attention-deficit hyperactivity disorder, predominantly inattentive type: Secondary | ICD-10-CM

## 2023-04-28 DIAGNOSIS — G4700 Insomnia, unspecified: Secondary | ICD-10-CM

## 2023-04-28 DIAGNOSIS — F411 Generalized anxiety disorder: Secondary | ICD-10-CM

## 2023-04-28 MED ORDER — AUVELITY 45-105 MG PO TBCR: 1.0000 | EXTENDED_RELEASE_TABLET | Freq: Two times a day (BID) | ORAL | 0 refills | Status: DC

## 2023-04-28 NOTE — Progress Notes (Signed)
 [facility]  Patient ID: Andrianna Manalang,  MRN: [id number]  PCP: Micheal Wolm ORN, MD  Date of Evaluation: 04/28/2023 Time spent:40 minutes  Chief Complaint:  Chief Complaint   Depression; Other    HISTORY/CURRENT STATUS: HPI For Spravato .   Samirah is doing well.  She sees Dr. Geoffry.  I am seeing her in his absence today.  She still feels that the Spravato  is effective.  She is on Auvelity  once a day and would like to continue that.  She decreased to the Effexor  and would like to continue to wean off.  She is more able to enjoy things.  Energy and motivation are fair, depending on the situation.  She still has a hard time staying on task and gets distracted easily at times but not at others.  No reports of extreme sadness, not crying easily and does not feel hopeless.  She sleeps pretty well.  ADLs and personal hygiene are normal.  Appetite is normal.  She gets overwhelmed easily and sometimes panicky.  Klonopin  is effective.  Denies suicidal or homicidal thoughts.  Patient denies increased energy with decreased need for sleep, increased talkativeness, racing thoughts, impulsivity or risky behaviors, increased spending, increased libido, grandiosity, increased irritability or anger, paranoia, or hallucinations.  Review of Systems  Constitutional: Negative.   HENT: Negative.    Eyes: Negative.   Respiratory: Negative.    Cardiovascular: Negative.   Gastrointestinal: Negative.   Genitourinary: Negative.   Musculoskeletal: Negative.   Skin: Negative.   Neurological: Negative.   Endo/Heme/Allergies: Negative.   Psychiatric/Behavioral:         See HPI   Individual Medical History/ Review of Systems: Changes? :No   Past Psychiatric Medication Trials: Trintellix - Initially effective and then was not effective when re-started Paxil - Ineffective. Helped initially. Prozac-Insomnia Lexapro - Effective and then no longer as effective Zoloft - Initially effective and then  minimal response Viibryd  Cymbalta- Ineffective. Helped initially. Effexor  XR 300- Effective, well tolerated. Pristiq - Increased anxiety at 150 mg daily Wellbutrin  XL-Ineffective.May have increased anxiety. Amitriptyline Nortriptyline -Caused irritability, constipation Auvelity - ineffective Selegilne brief 20 mg daily , more irritable   Abilify - Helpful but caused severe insomnia. Rexulti -Helpful but caused insomnia. Vraylar  Seroquel  - Ineffective Risperdal Olanzapine- Ineffective Latuda - Ineffective Caplyta    Klonopin - Effective Temazepam - Took during menopause Lunesta- Ineffective Ambien-Ineffective Sonata- Ineffective Dayvigo - partially effective  Belsomra  20 worked and then failed  Trazodone -  somewhat effective Remeron-Ineffective. Does not recall wt gain.  Quetiapine  25 NR   Adderall- Took once and had adverse effect. Concerta - Effective but only 4-6 hours Jornay 80 NR Metadate -Not as effective as Concerta  Dexmethylphenidate  ER 10/02/21- Not as effective as Concerta  Azstarys - some side effects. Not as effective.  Ritalin  short acting and SE anxiety & HTN @20  TID Cotempla  17.3  Daytrana  NR   Lamictal- May have had an adverse effects. Felt weird. Reports worsening s/s.  Lithium  ? effect Gabapentin  Pramipexole - 1 mg QID NR   ECT #20 with minimal response. H says it was partly helpful.  Allergies: Ciprofloxacin and Oxycodone-acetaminophen   Current Medications:  Current Outpatient Medications:    clonazePAM  (KLONOPIN ) 1 MG tablet, Take 1-2 tablets (1-2 mg total) by mouth at bedtime., Disp: 60 tablet, Rfl: 0   Dexmethylphenidate  HCl 40 MG CP24, Take 1 capsule (40 mg total) by mouth every morning., Disp: 15 capsule, Rfl: 0   Esketamine HCl, 84 MG Dose, (SPRAVATO , 84 MG DOSE,) 28 MG/DEVICE SOPK, Place 84 mg into the nose once a week., Disp: 3 each, Rfl: 1  Estradiol  (VAGIFEM ) 10 MCG TABS vaginal tablet, Place 1 tablet (10 mcg total) vaginally 2 (two) times a week.,  Disp: 24 tablet, Rfl: 3   lithium  carbonate (LITHOBID ) 300 MG ER tablet, Take 2 tablets (600 mg total) by mouth at bedtime., Disp: 180 tablet, Rfl: 0   traZODone  (DESYREL ) 50 MG tablet, Take 1 tablet (50 mg total) by mouth at bedtime., Disp: 30 tablet, Rfl: 0   valACYclovir  (VALTREX ) 500 MG tablet, Take one twice daily for 3 days with any symptoms, Disp: 30 tablet, Rfl: 3   valsartan  (DIOVAN ) 80 MG tablet, Take 1 tablet (80 mg total) by mouth daily., Disp: 90 tablet, Rfl: 3   venlafaxine  XR (EFFEXOR -XR) 150 MG 24 hr capsule, Take 150 mg by mouth daily with breakfast., Disp: , Rfl:    Dextroamphetamine  (XELSTRYM ) 13.5 MG/9HR PTCH, Place 1 patch onto the skin every morning. (Patient not taking: Reported on 04/04/2023), Disp: 30 patch, Rfl: 0   Dextromethorphan-buPROPion  ER (AUVELITY ) 45-105 MG TBCR, Take 1 tablet by mouth 2 (two) times daily., Disp: 60 tablet, Rfl: 0 Medication Side Effects: none  Family Medical/ Social History: Changes? No  MENTAL HEALTH EXAM:  Last menstrual period 12/27/2007.There is no height or weight on file to calculate BMI.  General Appearance: Casual and Well Groomed  Eye Contact:  Good  Speech:  Clear and Coherent and Normal Rate  Volume:  Normal  Mood:  Euthymic  Affect:  Congruent  Thought Process:  Goal Directed and Descriptions of Associations: Circumstantial  Orientation:  Full (Time, Place, and Person)  Thought Content: Logical   Suicidal Thoughts:  No  Homicidal Thoughts:  No  Memory:  WNL  Judgement:  Good  Insight:  Good  Psychomotor Activity:  Normal  Concentration:  Concentration: Good and Attention Span: Fair  Recall:  Good  Fund of Knowledge: Good  Language: Good  Assets:  Communication Skills Desire for Improvement Financial Resources/Insurance Housing Transportation  ADL's:  Intact  Cognition: WNL  Prognosis:  Good   Patient was administered Spravato  84 mg intranasally today.  The patient experienced the typical dissociation which  gradually resolved over the 2-hour period of observation.  There were no complications.  Specifically the patient did not have nausea or vomiting or headache.  Blood pressures remained within normal ranges at the 40-minute and 2-hour follow-up intervals.  By the time the 2-hour observation period was met the patient was alert and oriented and able to exit without assistance.  Patient feels the Spravato  administration is helpful for the treatment resistant depression and would like to continue the treatment.  See nursing note for further details.  DIAGNOSES:    ICD-10-CM   1. 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 Generalized anxiety disorder  F41.1     3. Attention deficit hyperactivity disorder (ADHD), predominantly inattentive type  F90.0     4. Insomnia, unspecified type  G47.00      Receiving Psychotherapy: Yes  with Cato Sprang, [facility]  RECOMMENDATIONS:  PDMP reviewed.  Spravato  filled 03/19/2023.  Focalin  XR filled 03/06/2023. Gabapentin  filled 04/13/2023. I provided 40 minutes of face to face time during this encounter, including time spent before and after the visit in records review, medical decision making, counseling pertinent to today's visit, and charting.   I discussed her case with Dr. Geoffry.  He will send in prescription for the Auvelity .  He recommend she stay on the current dose of Effexor  until their next visit. Patient had  already left when I discussed with Dr. Geoffry, I called her, but got VM and was then cut off so was unable to leave the message. I will send a msg via MyChart.   Cont Klonopin  1 mg, 1-2 po at bedtime prn.  Continue Focalin  XR 40 mg, 1 po q am.  Increase Spravato  45-105 mg to 1 p.o. twice daily. Cont Spravato  per Dr. Geoffry.  Continue Lithobid  300 mg, 2 p.o. nightly. Continue trazodone  50 mg, 1 p.o. nightly.   Continue therapy with Cato Sprang, [facility].  Return in 2 weeks.  Verneita Cooks, PA-C

## 2023-04-28 NOTE — Progress Notes (Signed)
 NURSES NOTE:           Patient arrived for her #63 Spravato  treatment. Pt is coming every other week now. Pt is being treated for Treatment Resistant Depression, pt will be receiving 84 mg (3 of the 28 mg) nasal sprays, this is her maintenance dose. Patient taken to treatment room, I explained and discussed her treatment and how the schedule should go, along with any side effects that may occur. Answered any questions and concerns the patient had. Pt's Spravato  is delivered through [facility] and she is now billing with her insurance. Spravato  medication is stored at doctors office per REMS/FDA guidelines. The medication is required to be locked behind two doors per FDA/REMS Protocol. Medication is also disposed of properly per regulations. All treatments and her vital signs are documented in Spravato  REMS per protocol of treatment center regulations. [facility] delivers her medication.      Vital Signs assessed at 8:45 AM 111/72, pulse 78, pulse ox 92%. Instructed pt to blow her nose and recline back slightly to prevent any of medication dripping out of her nose.   Pt. Given 1st dose (28 mg inhaler), then 5 minutes between 2nd dose and 3rd dose for total of 84 mg. No complaints of nausea/vomiting reported. Pt did listen to music today, she just reclined back and closed her eyes. After 40 minutes I went to assess her vital signs, no side effects or symptoms reported, B/P 103/65, pulse 76.  Verneita Cooks, PA-C talked to pt today about her treatment. Nurse was with pt a total of 60 minutes but pt observed for 120 minutes per protocol. Discharge vital signs were at 10:40 AM, B/P 111/79, pulse 73. She verbalized understanding. She reports dissociation but she was clear upon her discharge. She is coming next week of December 13th. She is instructed to contact office if needing anything prior to her next treatment.     LOT # [id number]   EXP APR 2027     Orthostatic Vitals Recorded in Thi

## 2023-04-28 NOTE — Telephone Encounter (Signed)
Pt was in today for Spravato Treatment and asked if her next dose of Effexor XR be sent in, pt reports she has been taking the 150 mg for awhile and is requesting the next strength. Pt also had a Rx for Auvelity 45-105 mg bid dosing sent to pharmacy.

## 2023-05-01 ENCOUNTER — Ambulatory Visit (INDEPENDENT_AMBULATORY_CARE_PROVIDER_SITE_OTHER): Payer: Self-pay | Admitting: Professional Counselor

## 2023-05-01 ENCOUNTER — Other Ambulatory Visit: Payer: Self-pay

## 2023-05-01 DIAGNOSIS — F339 Major depressive disorder, recurrent, unspecified: Secondary | ICD-10-CM

## 2023-05-01 DIAGNOSIS — Z0389 Encounter for observation for other suspected diseases and conditions ruled out: Secondary | ICD-10-CM

## 2023-05-01 MED ORDER — SPRAVATO (84 MG DOSE) 28 MG/DEVICE NA SOPK: 84.0000 mg | PACK | NASAL | 3 refills | Status: DC

## 2023-05-01 NOTE — Progress Notes (Signed)
 Patient did not show for scheduled appointment. No message.  Bartolo Darter, [facility]

## 2023-05-04 ENCOUNTER — Other Ambulatory Visit: Payer: Self-pay | Admitting: Psychiatry

## 2023-05-04 DIAGNOSIS — G4700 Insomnia, unspecified: Secondary | ICD-10-CM

## 2023-05-04 DIAGNOSIS — F419 Anxiety disorder, unspecified: Secondary | ICD-10-CM

## 2023-05-04 NOTE — Telephone Encounter (Signed)
 Received RF request for gabapentin. It is not on med list and per 12/2 note it was discontinued because it was ineffective. Patient said she went back on it, reports taking 600 mg at night. Please send if appropriate.

## 2023-05-05 ENCOUNTER — Other Ambulatory Visit: Payer: Self-pay | Admitting: Psychiatry

## 2023-05-05 DIAGNOSIS — G4700 Insomnia, unspecified: Secondary | ICD-10-CM

## 2023-05-05 MED ORDER — GABAPENTIN 100 MG PO CAPS: ORAL_CAPSULE | ORAL | 0 refills | Status: DC

## 2023-05-05 NOTE — Telephone Encounter (Signed)
 Clarification pt did restart taking Gabapentin  300 mg that she had from a previous Rx see my most recent phone message 12/04 with a change. Per Dr. Calhoun note starting 06/2022 pt was taking Gabapentin  again with varying doses. Pt is currently taking Gabapentin  300 mg 2 at bedtime, then taking an additional 100-200 mg Gabapentin  if waking during the night.  Will update Rx's and medication list.

## 2023-05-11 ENCOUNTER — Encounter: Payer: Self-pay | Admitting: Psychiatry

## 2023-05-11 ENCOUNTER — Ambulatory Visit: Payer: 59

## 2023-05-11 ENCOUNTER — Ambulatory Visit: Payer: 59 | Admitting: Psychiatry

## 2023-05-11 VITALS — BP 117/85 | HR 69

## 2023-05-11 DIAGNOSIS — F900 Attention-deficit hyperactivity disorder, predominantly inattentive type: Secondary | ICD-10-CM | POA: Diagnosis not present

## 2023-05-11 DIAGNOSIS — F339 Major depressive disorder, recurrent, unspecified: Secondary | ICD-10-CM | POA: Diagnosis not present

## 2023-05-11 DIAGNOSIS — G4700 Insomnia, unspecified: Secondary | ICD-10-CM | POA: Diagnosis not present

## 2023-05-11 DIAGNOSIS — F411 Generalized anxiety disorder: Secondary | ICD-10-CM

## 2023-05-11 DIAGNOSIS — F419 Anxiety disorder, unspecified: Secondary | ICD-10-CM

## 2023-05-11 MED ORDER — GABAPENTIN 300 MG PO CAPS: ORAL_CAPSULE | ORAL | 1 refills | Status: DC

## 2023-05-11 MED ORDER — CLONAZEPAM 1 MG PO TABS: 1.0000 mg | ORAL_TABLET | Freq: Every day | ORAL | 0 refills | Status: DC

## 2023-05-11 NOTE — Progress Notes (Signed)
 NURSES NOTE:           Patient arrived for her #64 Spravato  treatment. Pt is coming every other week now. Pt is being treated for Treatment Resistant Depression, pt will be receiving 84 mg (3 of the 28 mg) nasal sprays, this is her maintenance dose. Patient taken to treatment room, I explained and discussed her treatment and how the schedule should go, along with any side effects that may occur. Answered any questions and concerns the patient had. Pt's Spravato  is delivered through [facility] and she is now billing with her insurance. Spravato  medication is stored at doctors office per REMS/FDA guidelines. The medication is required to be locked behind two doors per FDA/REMS Protocol. Medication is also disposed of properly per regulations. All treatments and her vital signs are documented in Spravato  REMS per protocol of treatment center regulations. [facility] delivers her medication.      Vital Signs assessed at 8:10 AM 112/75, pulse 120 pulse ox 92%. Instructed pt to blow her nose and recline back slightly to prevent any of medication dripping out of her nose.   Pt. Given 1st dose (28 mg inhaler), then 5 minutes between 2nd dose and 3rd dose for total of 84 mg. No complaints of nausea/vomiting reported. Pt did listen to music today, she just reclined back and closed her eyes. After 40 minutes I went to assess her vital signs, no side effects or symptoms reported, B/P 134/79, pulse 63.  Dr. Geoffry talked to pt today about her treatment. Nurse was with pt a total of 60 minutes but pt observed for 120 minutes per protocol. Discharge vital signs were at 10:10 AM, B/P 117/85, pulse 69. She verbalized understanding. She reports dissociation but she was clear upon her discharge. She is instructed to contact office if needing anything prior to her next treatment.  She is scheduled on January 23 rd   LOT # [id number]   EXP APR 2027

## 2023-05-11 NOTE — Progress Notes (Signed)
 Heather Davila [id number] 1961-03-16 63 y.o.  Subjective:   Patient ID:  Heather Davila is a 63 y.o. (DOB 1961/01/20) female.  Chief Complaint:  Chief Complaint  Patient presents with  . Follow-up  . Depression  . ADD  . Fatigue  . Anxiety  . Sleeping Problem    HPI Heather Davila presents to the office today for follow-up of treatment resistant recurrent depression, generalized anxiety disorder and insomnia.  Almost too numerous to count med failures. She is here for Spravato  administration for her treatment resistant depression.  05/21/22 appt noted: Patient was administered first dose Spravato  56 mg intranasally today.  The patient experienced the typical dissociation which gradually resolved over the 2-hour period of observation.  There were no complications.  Specifically the patient did not have nausea or vomiting or headache.   Dissociation was mild. Did not experience any emotional relief from disabling depression.wants to increase Spravato  to the usual dose.  She is aware insurance is not covering the costs at this time.  No SI acutely. Tolerating meds.    05/23/22 appt noted: Patient was administered Spravato  84 mg intranasally today.  The patient experienced the typical dissociation which gradually resolved over the 2-hour period of observation.  There were no complications.  Specifically the patient did not have nausea or vomiting.   Dissociation was greater than last dose and a little bothersome DT some HA. Tolerating meds.   Mood has not changed significantly thus far with Spravato .  05/27/22 appt noted: Patient was administered Spravato  84 mg intranasally today.  The patient experienced the typical dissociation which gradually resolved over the 2-hour period of observation.  There were no complications.  Specifically the patient did not have nausea or vomiting or headache. Today she has felt a little relief from the pressing heaviness of depression but a little more  anxiety.  During administration of Spravato  she listens to Christian music and was able to relax a little more with the feeling of dissociation that was mildly bothersome. Husband was also in on the session today and asked some questions about IV ketamine versus nasal spray ketamine.  05/29/22 appt noted: Cancelled today DT hypertension  05/30/22 appt noted: Patient was administered Spravato  84 mg intranasally today.  The patient experienced the typical dissociation which gradually resolved over the 2-hour period of observation.  There were no complications.  Specifically the patient did not have nausea or vomiting or headache. She is seeing some improvement in mood with less continuous depression and less intensity.  Hopefulness is better.   No med complaints or SE  06/05/22 appt noted: Patient was administered Spravato  84 mg intranasally today.  The patient experienced the typical dissociation which gradually resolved over the 2-hour period of observation.  There were no complications.  Specifically the patient did not have nausea or vomiting or headache.  Specifically HA is less of a problem with Spravato . No SE with meds. Depression is improving with less intensity.  Intermittent anxiety easily.  More able to enjoy things.  No new concerns.  06/17/22 appt noted: Patient was administered Spravato  84 mg intranasally today.  The patient experienced the typical dissociation which gradually resolved over the 2-hour period of observation.  There were no complications.  Specifically the patient did not have nausea or vomiting or headache.  Specifically HA is less of a problem with Spravato . No SE with meds. Depression is improved 45% with less intensity.  Intermittent anxiety less easily.  More able to enjoy things.  No new  concerns.  More active. Current meds: increased clonazepam  to about 1.5 mg HS DT recent insomnia, lexapro  20, Dayvigo  10 mg HS, concerta  36 mg AM, trazodone  100 mg HS.  06/20/22 appt  noted: Current meds: increased clonazepam  to about 1.5 mg HS DT recent insomnia, lexapro  20, Dayvigo  10 mg HS, concerta  36 mg AM, trazodone  100 mg HS. Patient was administered Spravato  84 mg intranasally today.  The patient experienced the typical dissociation which gradually resolved over the 2-hour period of observation.  There were no complications.  Specifically the patient did not have nausea or vomiting or headache.  Specifically HA is less of a problem with Spravato . No SE with meds. Depression is improved 45% with less intensity.  Intermittent anxiety less easily.  More able to enjoy things.  No new concerns.  More active.  06/23/22 appt noted:  Current meds: increased clonazepam  to about 1.5 mg HS DT recent insomnia, lexapro  20, Dayvigo  10 mg HS, concerta  36 mg AM, trazodone  100 mg HS. Patient was administered Spravato  84 mg intranasally today.  The patient experienced the typical dissociation which gradually resolved over the 2-hour period of observation.  There were no complications.  Specifically the patient did not have nausea or vomiting or headache.  Specifically HA is less of a problem with Spravato . No SE with meds. Intensity of Spravato  dissociation varies.  Last week very intesne and this week more typical.  Depression is continuing to improve with better interest and activity and motivation.  Gradual improvement.  Wants to continue.  06/25/22 appt noted: Current meds: increased clonazepam  to about 1.5 mg HS DT recent insomnia, lexapro  20, Dayvigo  10 mg HS, concerta  36 mg AM, trazodone  100 mg HS. Patient was administered Spravato  84 mg intranasally today.  The patient experienced the typical dissociation which gradually resolved over the 2-hour period of observation.  There were no complications.  Specifically the patient did not have nausea or vomiting or headache.  Specifically HA is less of a problem with Spravato . No SE with meds. Intensity of Spravato  dissociation varies.  No  scary and well tolerated. Continues to gradually improve with Spravato .  She is more motivated and enjoying things more.  H sees progress.  Wants to continue twice weekly until gets maximum response.  Dep is not gone yet.  06/30/22 appt noted:  seen with H Current meds: increased clonazepam  to about 1.5 mg HS DT recent insomnia, lexapro  20, Dayvigo  10 mg HS, concerta  36 mg AM, trazodone  100 mg HS. Patient was administered Spravato  84 mg intranasally today.  The patient experienced the typical dissociation which gradually resolved over the 2-hour period of observation.  There were no complications.  Specifically the patient did not have nausea or vomiting or headache.  Specifically HA is less of a problem with Spravato . No SE with meds. Intensity of Spravato  dissociation varies.  No scary and well tolerated. Continues to gradually improve with Spravato .  She is more motivated and enjoying things more.  H sees even more progress than she does; more active and smiling.  Wants to continue twice weekly until gets maximum response.  Dep is 80% better.  07/07/22 appt noted: Current meds: increased clonazepam  to about 1.5 mg HS DT recent insomnia, lexapro  20, Dayvigo  10 mg HS, concerta  36 mg AM, trazodone  100 mg HS. Patient was administered Spravato  84 mg intranasally today.  The patient experienced the typical dissociation which gradually resolved over the 2-hour period of observation.  There were no complications.  Specifically the patient  did not have nausea or vomiting or headache.  Specifically HA is less of a problem with Spravato . No SE with meds. Intensity of Spravato  dissociation varies.  No scary and well tolerated.  Was more intese today. Overall mood is markedly better and please with meds. No changes desired.  07/09/22 appt noted: In transition from Lexapro  to sertraline  and missing Concerta  bc CO crash from it after about 4 hours.  Disc these concerns.  She'll discuss also with Harlene Pepper,  NP More dep the last couple of days and concerned about reducing Spravato  frequency while transition from one antidep to another.  Affect less positive. Patient was administered Spravato  84 mg intranasally today.  The patient experienced the typical dissociation which gradually resolved over the 2-hour period of observation.  There were no complications.  Specifically the patient did not have nausea or vomiting or headache.  Specifically HA is less of a problem with Spravato . No SE with meds. Intensity of Spravato  dissociation varies.  No scary and well tolerated.   07/14/22 appt noted: Has been more depressed this week.  More trouble with sleep.  Taking clonazepam  1-1.5  of the 0.5 mg tablets.   Trazodone  not helping much. She switched back from 200 sertraline  to Lexapro  20.  No SE More trouble with motivation.  Some stress with son with special needs. Misses the benevit of Concerta  but it was too short acting and crashed in 4-6 hours. Patient was administered Spravato  84 mg intranasally today.  The patient experienced the typical dissociation which gradually resolved over the 2-hour period of observation.  There were no complications.  Specifically the patient did not have nausea or vomiting or headache.  Specifically HA is less of a problem with Spravato . No SE with meds. Intensity of Spravato  dissociation varies.  No scary and well tolerated.   07/17/22 appt noted: Patient was administered Spravato  84 mg intranasally today.  The patient experienced the typical dissociation which gradually resolved over the 2-hour period of observation.  There were no complications.  Specifically the patient did not have nausea or vomiting or headache.  Specifically HA is less of a problem with Spravato . No SE with meds. Intensity of Spravato  dissociation varies.  Not scary and well tolerated.  Just got Jornay last night but wasn't sure how to take it.  Wants to start and this was discussed.  She felt MPH helped  mood and attn but didn't last long enough and had crash after a few hours on Concerta .  Better for 2 hours after each dose Ritalin  20 mg with mood but then it wore off.  BP up after 3rd dose 160/90 and then took H's amlodipine 5 and BP became normal.  07/22/22 appt noted: Patient was administered Spravato  84 mg intranasally today.  The patient experienced the typical dissociation which gradually resolved over the 2-hour period of observation.  There were no complications.  Specifically the patient did not have nausea or vomiting or headache.  Specifically HA is less of a problem with Spravato . No SE with meds. Intensity of Spravato  dissociation varies.  Not scary and well tolerated.  Start Jornay 20 mg over the weekend and did not notice any particular effect good or bad.  Increased to 40 mg. Other psych meds: Clonazepam  0.5 mg tablets 2 nightly, gabapentin  dosage varies, Dayvigo  10 mg nightly, Lexapro  20 mg daily, to trazodone  100 mg nightly  07/24/22 appt noted: Patient was administered Spravato  84 mg intranasally today.  The patient experienced the typical dissociation  which gradually resolved over the 2-hour period of observation.  There were no complications.  Specifically the patient did not have nausea or vomiting or headache.  Specifically HA is less of a problem with Spravato . No SE with meds. Intensity of Spravato  dissociation varies.  Not scary and well tolerated.  Other psych meds: Clonazepam  0.5 mg tablets 2 nightly, gabapentin  dosage varies, Dayvigo  10 mg nightly, Lexapro  20 mg daily, to trazodone  100 mg nightly, Jornay 40 mg PM Doing well with Spravato .  Noticed a little effect from the Jornay 40 without SE but would like to increase the dose for ADD and mood.  Sleeping well.  No new concerns.  Still more depressed than she was with th initial benefit of Spravato .  07/30/22 note:  Patient was administered Spravato  84 mg intranasally today.  The patient experienced the typical  dissociation which gradually resolved over the 2-hour period of observation.  There were no complications.  Specifically the patient did not have nausea or vomiting or headache.  Specifically HA is less of a problem with Spravato . Other psych meds: Clonazepam  0.5 mg tablets 2 nightly, gabapentin  dosage varies, Dayvigo  10 mg nightly, Lexapro  20 mg daily, to trazodone  100 mg nightly, Jornay 60 mg PM Wants to increase Jornay to 80 mg PM to increase benefit for ADD and depression. No SE She has experienced a major family crisis that threatens to change her daily life from now on.  It has not triggered suicidal thoughts at this time but she is very afraid.  No desire to change medications.  08/04/22 appt noted Patient was administered Spravato  84 mg intranasally today.  The patient experienced the typical dissociation which gradually resolved over the 2-hour period of observation.  There were no complications.  Specifically the patient did not have nausea or vomiting or headache.  Specifically HA is less of a problem with Spravato . Other psych meds: Clonazepam  0.5 mg tablets 2 nightly, gabapentin  dosage varies, Dayvigo  10 mg nightly, Lexapro  20 mg daily, trazodone  100 mg nightly, Jornay 80 mg PM No SE No benefit or SE with increase Jormay 80 mg.  Says Concerta  helped ADD and depression but not Jornay.  However duration was insufficient.  Still depressed and wants change to another form of stimulant.  No concerns with other meds. Continues with strong family stressors creating uncertainty about her future but no quick resolution. No SI Trial Cotempla  17.3 and DC Ritalin  & Jornay 80  08/11/2022 appointment noted: Patient was administered Spravato  84 mg intranasally today.  The patient experienced the typical dissociation which gradually resolved over the 2-hour period of observation.  There were no complications.  Specifically the patient did not have nausea or vomiting or headache.  Specifically HA is less  of a problem with Spravato  now vs when started it.. Other psych meds: Clonazepam  0.5 mg tablets 2 nightly, gabapentin  dosage varies, Dayvigo  10 mg nightly, trazodone  100 mg nightly, cotempla  17.3, switch from Lexapro  20 to sertaline 15o mg . No noticeable effects sertraline  from for depression but has seen some antianxiety effects from the sertraline .  No side effects noted.  No side effects with the other medicines.  Still is only getting very brief benefit 3 to 4 hours from Cotempla  for ADD and mood.  She wants to try the Daytrana  patch as we have discussed before. No SE  08/13/22 appt: Patient was administered Spravato  84 mg intranasally today.  The patient experienced the typical dissociation which gradually resolved over the 2-hour period of observation.  There were no complications.  Specifically the patient did not have nausea or vomiting or headache.  Specifically HA is less of a problem with Spravato  now vs when started it.. Other psych meds: Clonazepam  0.5 mg tablets 2 nightly, gabapentin  dosage varies, Dayvigo  10 mg nightly, trazodone  100 mg nightly, cotempla  17.3, switch from Lexapro  20 to sertaline 15o mg . No noticeable effects sertraline  from for depression but has seen some antianxiety effects from the sertraline .  No side effects noted.  No side effects with the other medicines.  Still is only getting very brief benefit 3 to 4 hours from Cotempla  for ADD and mood.  She wants to try the Daytrana  patch as we have discussed before.  Hasn't been able to get it yet. No SE Plan.  Continue to try to get Daytrana  30 AM  08/18/22 appt noted: Patient was administered Spravato  84 mg intranasally today.  The patient experienced the typical dissociation which gradually resolved over the 2-hour period of observation.  There were no complications.  Specifically the patient did not have nausea or vomiting or headache.  Specifically HA is less of a problem with Spravato  now vs when started it.. Other  psych meds: Clonazepam  0.5 mg tablets 2 nightly, gabapentin  dosage varies, Dayvigo  10 mg nightly, trazodone  100 mg nightly, cotempla  17.3, switch from Lexapro  20 to sertaline 15o mg . No noticeable effects sertraline  from for depression but has seen some antianxiety effects from the sertraline .  No side effects noted.  No side effects with the other medicines.  Still is only getting very brief benefit 3 to 4 hours from Cotempla  for ADD and mood.  She wants to try the Daytrana  patch as we have discussed before.  Hasn't been able to get it yet.  Overall depression is some better than it was.   No SE Plan.  Continue to try to get Daytrana  30 AM  08/25/22 appt : No benefit Daytrana .  No SE.  Wants further med changes and interested In retrying pramipexole .   Tolerating meds. Patient was administered Spravato  84 mg intranasally today.  The patient experienced the typical dissociation which gradually resolved over the 2-hour period of observation.  There were no complications.  Specifically the patient did not have nausea or vomiting or headache.  Specifically HA is less of a problem with Spravato  now vs when started it.. Other psych meds: Clonazepam  0.5 mg tablets 2 nightly, gabapentin  dosage varies, Dayvigo  10 mg nightly, trazodone  100 mg nightly, , switch from Lexapro  20 to sertaline 15o mg . Daytrana  30 AM for a few days. Struggles with depression and no SI.  Reduced interest and mood and motivation and enjoyment and socialization.  08/27/22 appt noted: Psych meds:  Clonazepam  0.5 mg tablets 2 nightly, gabapentin  dosage varies, Dayvigo  10 mg nightly, trazodone  100 mg nightly,  sertaline 15o mg . Returned to Cotempla  AM, pramipexole   Tolerating meds. Patient was administered Spravato  84 mg intranasally today.  The patient experienced the typical dissociation which gradually resolved over the 2-hour period of observation.  There were no complications.  Specifically the patient did not have nausea or vomiting  or headache.   Reports taking cotempla  bc brief mood benefit and sig focus and productivity benefit. Took 1 dose pramipexole  and felt more dep.  09/01/22 appt noted: Psych meds:  Clonazepam  0.5 mg tablets 2 nightly, gabapentin  dosage varies, Dayvigo  10 mg nightly, trazodone  100 mg nightly,  sertaline 15o mg . Returned to Cotempla  AM, pramipexole   0.25 mg BID. Tolerating  meds now. Patient was administered Spravato  84 mg intranasally today.  The patient experienced the typical dissociation which gradually resolved over the 2-hour period of observation.  There were no complications.  Specifically the patient did not have nausea or vomiting or headache.   Willing to try the pramipexole  and will continue 0.25 mg BID this week and then consider increase if tolerated.  Mood is a little better.  Still looking for further improvement.  09/03/22 appt noted: Psych meds:  Clonazepam  0.5 mg tablets 2 nightly, gabapentin  dosage varies, Dayvigo  10 mg nightly, trazodone  100 mg nightly,  sertaline 15o mg . Returned to Cotempla  AM, pramipexole   0.25 mg tablet 1 and 1/2 tablets twice daily BID. Tolerating meds now. Patient was administered Spravato  84 mg intranasally today.  The patient experienced the typical dissociation which gradually resolved over the 2-hour period of observation.  There were no complications.  Specifically the patient did not have nausea or vomiting or headache.   Depression may be a little better with the parmipexole and is tolerating it well so far.  Still struggling with focus and residual depression.  Tolerating meds without SE.  More hopeful and able to enjoy some things.  09/08/22 appt  Psych meds:  Clonazepam  0.5 mg tablets 2 nightly, gabapentin  dosage varies, Dayvigo  10 mg nightly, trazodone  100 mg nightly,  sertaline 15o mg . Returned to Cotempla  AM, pramipexole   0.5 mg BID. Tolerating meds now. Patient was administered Spravato  84 mg intranasally today.  The patient experienced the  typical dissociation which gradually resolved over the 2-hour period of observation.  There were no complications.  Specifically the patient did not have nausea or vomiting or headache.   She has seen her depression drop from a 10/10 prior to treatment with Spravato  to now 4/10 With additional benefit noted when she increased the pramipexole  to 0.5 mg twice daily.  She is not having side effects with this like she did in the past. Anxiety levels are also improved. She is sleeping okay with the current medications. Plan: continue pramipexole  trial off-label and increase more slowly for TRD.  Increase  Gradually  to 0.75 mg BID  09/11/22 appt noted: Psych meds:  Clonazepam  0.5 mg tablets 2 nightly, gabapentin  dosage varies, Dayvigo  10 mg nightly, trazodone  100 mg nightly,  sertaline 15o mg . Returned to Cotempla  AM, pramipexole   0.75 mg BID. Tolerating meds now. Patient was administered Spravato  84 mg intranasally today.  The patient experienced the typical dissociation which gradually resolved over the 2-hour period of observation.  There were no complications.  Specifically the patient did not have nausea or vomiting or headache.   She has seen her depression drop from a 10/10 prior to treatment with Spravato  to now 4/10 With additional benefit noted when she increased the pramipexole  to 0.5 mg twice daily.  She is not having side effects with this like she did in the past. Clearly improving with the pramipexole  now and tolerating it.  Satisfied with current meds but would consider increasing if it might be helpful.  09/15/22 appt noted: Psych meds:  Clonazepam  0.5 mg tablets 2 nightly, gabapentin  dosage varies, Dayvigo  10 mg nightly, trazodone  100 mg nightly,  sertaline 15o mg . Returned to Cotempla  AM, pramipexole   0.75 mg BID too anxious and reduced to 0.5 mg BID. Tolerating meds now. Patient was administered Spravato  84 mg intranasally today.  The patient experienced the typical dissociation  which gradually resolved over the 2-hour period of observation.  There were no  complications.  Specifically the patient did not have nausea or vomiting or headache.   Trouble staying asleep longer than 6 hours.  Doesn't feel she can tolerate pramipexole  0.75 mg BID DT anxiety not experienced at  0.5 mg BID.  Otherwise tolerating meds.  Mood is better with Spravato  and pramipexole .   Not sleeping as long with meds.  EMA 6 hours.  Wonders about change Disc concerns about waiting for therapy. Feels ready to reduce Spravato  to weekly.    09/25/22 appt noted: Psych meds:  Clonazepam  0.5 mg tablets 2 nightly, gabapentin  dosage varies, Dayvigo  10 mg nightly, trazodone  100 mg nightly,  sertaline 15o mg . Returned to Cotempla  AM, pramipexole   0.75 mg BID too anxious and reduced to 0.5 mg BID. Tolerating meds now. Patient was administered Spravato  84 mg intranasally today.  The patient experienced the typical dissociation which gradually resolved over the 2-hour period of observation.  There were no complications.  Specifically the patient did not have nausea or vomiting or headache.   Trouble staying asleep longer than 6 hours.  Failed to respond to Seroquel  25 mg HS.  Gone back to Dayvigo .  Belsomra  NR. Mood ok until the last few days more dep bc it's been 10 days since last admin.  Wants to continue weekly.   No SE.  No med changes desired.  09/29/22 appt noted: Psych meds:  Clonazepam  0.5 mg tablets 2 nightly, gabapentin  dosage varies, Dayvigo  10 mg nightly, trazodone  100 mg nightly,  sertaline 15o mg . Returned to Cotempla  AM, pramipexole   0.75 mg BID too anxious and reduced to 0.5 mg BID. Tolerating meds now. Patient was administered Spravato  84 mg intranasally today.  The patient experienced the typical dissociation which gradually resolved over the 2-hour period of observation.  There were no complications.  Specifically the patient did not have nausea or vomiting or headache.   Trouble staying asleep  longer than 6 hours.  Plan: Continue sertaline to 200 mg daily.  It has helped anxiety but not depression.   for insomnia, clonazepam  1 mg HS. trazodone  doesn't work well but helps some Continue Dayvigo  10 HS.  Failed resp Belsomra  15 and seroquel  25 She wants to continue Cotempla  or Concerta  bc helps mood early in day and focus  benefit lasts longer.  Have not been able to get stimulant to be consitently effective throughout the day. continue pramipexole  trial off-label and reduce back to  TRD 0.5 mg BID Spravato  weekly.  10/13/22 appt noted: Not sleeping as well.  Wants to increase Spravato  to twice weekly bc feeling more dep close to the next sched date.  Just the day or 2 before.  .  Plan: Trial for off label TRD increase pramipexole  1 mg TID  10/20/22 appt noted: Psych meds:  Clonazepam  0.5 mg tablets 2 nightly, gabapentin  dosage varies, Dayvigo  10 mg nightly, trazodone  100 mg nightly,  sertaline 15o mg . Returned to Cotempla  AM, pramipexole   1 mg TID. Tolerating meds now. Patient was administered Spravato  84 mg intranasally today.  The patient experienced the typical dissociation which gradually resolved over the 2-hour period of observation.  There were no complications.  Specifically the patient did not have nausea or vomiting or headache.   Trouble staying asleep longer than 6 hours.  More dep this week and asked to get Spravato  twice this week. Plan: Trial for off label TRD increase pramipexole  1 mg QID  10/22/22 appt noted:  Psych meds:  Clonazepam  0.5 mg tablets 2 nightly and  another one with EMA, gabapentin  dosage varies, Belsomra  20 mg daily,  trazodone  100 mg nightly,  sertaline 15o mg . Returned to Cotempla  AM, pramipexole   1 mg QID for 2nd day Tolerating meds now.  Except a little N and dizzy briefly after takes it. Patient was administered Spravato  84 mg intranasally today.  The patient experienced the typical dissociation which gradually resolved over the 2-hour period of  observation.  There were no complications.  Specifically the patient did not have nausea or vomiting or headache. No change in mood yet.   Sleep better with Belsomra  20 as long as takes with other meds.  If insomnia mood is worse. Ongoing some dep but gets partial relief with Spravato  but would like better relief .  10/27/22 received Spravato  84  11/03/22 appt noted:  Meds as above except sertraline  she cut to 100 mg instead of 200 mg daily She doesn't think sertraline  helping and worries over poss SE Off and on Cotempla  .  Not affecting BP. Doesn't notice benefit or SE with pramipexole  1 mg QID.  No SE Sleep is OK.  Some awakening aftter 6 hours but not enough so takes another clonazepam .   Patient was administered Spravato  84 mg intranasally today.  The patient experienced the typical dissociation which gradually resolved over the 2-hour period of observation.  There were no complications.  Specifically the patient did not have nausea or vomiting or headache. No change in mood yet.   Sleep better with Belsomra  20 as long as takes with other meds.  If insomnia mood is worse. Ongoing some dep but gets partial relief with Spravato  but would like better relief .  No sig benefit with pramipexole  1 mg QID.  11/21/22 TC:  To ER with dep 11/21/22. No SE with selegiline  but feels more dep.  Wants to stop it. Today is only day 2 of 1 tablet of selegiline  5 mg AM.  Reviewed with her that she had not been satisfied with the response of Spravato  and Zoloft  and that she is likely feeling worse because the Zoloft  is wearing off and in particular because she abruptly stopped 150 mg daily.  She needs to give the selegiline  time to work.  She agrees to do so.  She admitted to some passive suicidal thoughts but no active suicidal thoughts and no desire to die.  She agrees to the plan to increase selegiline  to 10 mg every morning this weekend and to continue the selegiline  trial.  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  She is aware.  Lorene Macintosh, MD, DFAPA  12/19/22 TC: Pt called at 1:42p.  She said she wanted to know if she could start weaning off the Selegiline .  She thinks increasing the dosage is making things worse.  She said she wants to go back to Spravato .  She acknowleged she has an appt on Monday. I advised her that I wasn't sure Dr Macintosh will see her message before her appt, but she still ask me to send the message.  Next appt 8/26 MD resp.  No change until Monday appt.  12/22/22 appt noted: Meds: selegiline  up to 5 mg 4 daily for a week but felt it incr irritability and went back to 3 mg daily. She wants to go back to Spravato .  Not near as dep as now.  Selegiline  didn't help energy. Still on Belsomra  Thinks maybe sertraline  helped mood more than she thought. More dep  than anxiety.   Plan: DC selegiline  and will retry Effexor  which helped in the past at 300 mg daily.  Start this Friday after over 10 half lives of selegiline .    12/24/22 appt noted: Stopped selegiline   Psych med: belsomra  20, clonazepam  1 mg HS.   She has felt more dep off the Spravato  though at the time didn't think it was helping.  Has felt more down and withdrawn.  Low energy, motivation, not smiling.  No SI.  Sleep ok.   Patient was administered Spravato  84 mg intranasally today.  The patient experienced the typical dissociation which gradually resolved over the 2-hour period of observation.  There were no complications.  Specifically the patient did not have nausea or vomiting or headache. No problems with BP or other unusual SE Mood is a little better after this administration of Spravato .  Very motivated to continue it.  No immediate med concerns. No SI .  Ongoing anhedonia.  12/25/22 appt noted: Psych med: belsomra  20, clonazepam  1 mg HS.  Not resumed stimulant or AD yet   No SE. Patient was administered Spravato  84 mg intranasally today.  The patient experienced  the typical dissociation which gradually resolved over the 2-hour period of observation.  There were no complications.  Specifically the patient did not have nausea or vomiting or headache.  There is a lot things going on in her life and it is somewhat difficult to separate stressors from what is going on with her mood.  However she is happy to resume Spravato  as she decided after stopping briefly that it was helpful.  She plans to start the antidepressant Morrow.  She also wants to resume the stimulant tomorrow.  12/30/22 appt noted; Psych med: belsomra  20, clonazepam  1 mg HS.  Not resumed stimulant.  Effexor  XR 37.5 mg daily   No SE. Patient was administered Spravato  84 mg intranasally today.  The patient experienced the typical dissociation which gradually resolved over the 2-hour period of observation.  There were no complications.  Specifically the patient did not have nausea or vomiting or headache.  She is still having depression with very prominent anhedonia.  However she feels the Spravato  is somewhat helpful and is hopeful for more complete response as she gets back onto a reasonable antidepressant dose.  She tolerated Effexor  XR 300 mg daily in the past and took it for 5 months and it is probably the antidepressants worked best for her.  She is agreeable with this plan.  She is getting sleep benefit from the current medications Plan: incr Effexor  75 mg daily  01/02/23 appt noted: Psych meds as noted above except increase Effexor  XR to 75 mg daily without side effects. Patient was administered Spravato  84 mg intranasally today.  The patient experienced the typical dissociation which gradually resolved over the 2-hour period of observation.  There were no complications.  Specifically the patient did not have nausea or vomiting or headache.  Agrees to increasing Effexor  as planned.  SX not changed from that noted above.  No SI  01/05/23 appt noted: Psych meds as noted above except increase Effexor   XR to 112.5 mg daily without side effects. Patient was administered Spravato  84 mg intranasally today.  The patient experienced the typical dissociation which gradually resolved over the 2-hour period of observation.  There were no complications.  Specifically the patient did not have nausea or vomiting or headache.  Agrees to increasing Effexor  as planned gradually to 300 mg daily.  SX not changed  from that noted above.  Does however feel Spravato  has dampened the degree of depression.  No SI Stressful life event discussed and won't be resolved until early next year.  01/07/23 appt noted: Psych meds Effexor  XR 112.5 mg daily. Patient was administered Spravato  84 mg intranasally today.  The patient experienced the typical dissociation which gradually resolved over the 2-hour period of observation.  There were no complications.  Specifically the patient did not have nausea or vomiting or headache.  Agrees to increasing Effexor  as planned gradually to 300 mg daily.  SX not changed from that noted above.  Does however feel Spravato  has dampened the degree of depression markedly compared to when off psych meds and no Spravato .  No SI  01/12/23 appt noted; Psych meds Effexor  XR 225 mg daily. No SE notedl. Patient was administered Spravato  84 mg intranasally today.  The patient experienced the typical dissociation which gradually resolved over the 2-hour period of observation.  There were no complications.  Specifically the patient did not have nausea or vomiting.  But is having headache.  Agrees to increasing Effexor  as planned gradually to 300 mg daily.  SX not changed from that noted above.  Does however feel Spravato  has dampened the degree of depression markedly compared to when off psych meds and no Spravato .  No SI  01/15/23 appt noted:  Psych meds Effexor  XR 225 mg daily. No SE noted. Patient was administered Spravato  84 mg intranasally today.  The patient experienced the typical dissociation which  gradually resolved over the 2-hour period of observation.  There were no complications.  Specifically the patient did not have nausea or vomiting.   Doing ok with higher doses of Effexor  and will plan to increase to 300 mg daily. Mild improvement noticed so far with the incrase in meds.   Uses Cotempla  prn and it helps but doesn't liek the way she feels when it wears off.  01/22/23 appt noted: Psych meds reduced Effexor  XR 150 mg daily. Bc of HA No SE noted. Patient was administered Spravato  84 mg intranasally today.  The patient experienced the typical dissociation which gradually resolved over the 2-hour period of observation.  There were no complications.  Specifically the patient did not have nausea or vomiting.  Ongoing moderate levels of depression with some improvement from the Spravato .  She is not sure about the effect of the venlafaxine  on her mood yet.  She would like to continue to try pursuing a stimulant but has had difficulty tolerating p.o. versions of stimulants because of the crash at the end and side effects with many of them.  She has several questions about this.  01/27/23 appt noted: Psych meds: Clonazepam  1 to 1.5 mg nightly, lithium  CR 300 nightly, Belsomra  20 nightly, venlafaxine  XR 150 every morning No side effects noted at the current dosage. Patient was administered Spravato  84 mg intranasally today.  The patient experienced the typical dissociation which gradually resolved over the 2-hour period of observation.  There were no complications.  Specifically the patient did not have nausea or vomiting.  Ongoing moderate levels of depression with some improvement from the Spravato .   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  She does not tolerate oral stimulants very well because she has a crash as they wear off which is very dysphoric.  She is willing to retry the Daytrana  patches that she took in the spring.   She took 1 patch  on 1 occasion recently and felt it was helpful. However she is also having problems with early morning awakening over the last week without precipitant.  She would like to have a different treatment strategy for sleep but understands her sleep problems have historically been difficult to resolve and maintain.  02/10/23 appt noted: Psych meds: Clonazepam  1 to 1.5 mg nightly, lithium  CR 300 nightly, Belsomra  20 nightly, venlafaxine  XR 150 every morning, Daytrana  30 mg Am No side effects noted at the current dosage. Patient was administered Spravato  84 mg intranasally today.  The patient experienced the typical dissociation which gradually resolved over the 2-hour period of observation.  There were no complications.  Specifically the patient did not have nausea or vomiting.  Ongoing moderate levels of depression with some improvement from the Spravato .  Particularly anhedonia.  No sig benefit noted with Daytrana  and problems with table stimulants bc gets brief benefit with unpleasant crash.  Wants to try another type of patch if available.  02/17/23 appt noted: Psych meds: Clonazepam  1 to 1.5 mg nightly, lithium  CR 300 nightly, Belsomra  20 nightly, venlafaxine  XR 225 mg every morning, Daytrana  30 mg Am No side effects noted at the current dosage. Patient was administered Spravato  84 mg intranasally today.  The patient experienced the typical dissociation which gradually resolved over the 2-hour period of observation.  There were no complications.  Specifically the patient did not have nausea or vomiting.  Ongoing moderate levels of depression with some improvement from the Spravato .  Particularly anhedonia.  She has not gotten the new stimulant yet but is hopeful. No problems with increasing venlafaxine .   Mood is better with Spravato  and venlafaxine .  Less anhedonia, less down. Themazepam not helpful.    03/04/23 received Spravato  84 03/12/23 received Spravato  84  03/19/23 appt  noted: Psych meds: Clonazepam  1 to 1.5 mg nightly, lithium  CR 300 nightly, Belsomra  20 nightly, venlafaxine  XR 300 mg every morning, tried various stimulants lately No side effects noted at the current dosage. Patient was administered Spravato  84 mg intranasally today.  The patient experienced the typical dissociation which gradually resolved over the 2-hour period of observation.  There were no complications.  Specifically the patient did not have nausea or vomiting.  Not getting good duration from oral stimulants.  She would like to try another patch but has to take tabs first and try them before insurance will cover patches.  Overall dep is better with meds and Spravato .  Clearly sees benefit.  03/30/23 appt noted: Psych meds: Clonazepam  1 to 1.5 mg nightly, lithium  CR 300 nightly,  venlafaxine  XR 300 mg every morning, tried various stimulants lately, gabapentin  900 mg HS, dexmethylphenidate  ER without help No side effects noted at the current dosage. Patient was administered Spravato  84 mg intranasally today.  The patient experienced the typical dissociation which gradually resolved over the 2-hour period of observation.  There were no complications.  Specifically the patient did not have nausea or vomiting.  Able to leave at end of 2 hour period without assistance.  Clear mood benefit with Spravato  with much less dep and wants to continue. Still trouble sleeping. TR insomnia.  Ongoing conc problems with minimal brief benefit from stimulants.  Not able to get patch Xelstrem yet DT insurance.  04/14/23 appt noted: Psych meds: Clonazepam  1 to 1.5 mg nightly, lithium  CR 300 nightly,  she reduced on her own venlafaxine  XR to 150 mg every morning, tried various stimulants lately, gabapentin  900 mg HS, dexmethylphenidate  ER without help No  side effects noted at the current dosage. Patient was administered Spravato  84 mg intranasally today.  The patient experienced the typical dissociation which  gradually resolved over the 2-hour period of observation.  There were no complications.  Specifically the patient did not have nausea or vomiting.  Able to leave at end of 2 hour period without assistance.  Clear mood benefit with Spravato  with much less dep and wants to continue. she went to [facility] and had her brain scanned at [facility]. they recommend she taper off Effexor  which she is only taking 150 mg currently and start Auvelity . She reduce Effexor  on her own to 150 mg daily. She wants further improvement in anhedonia.    04/28/23 received Spravato  84  05/11/23 appt noted: Psych meds: Clonazepam  1 to 1.5 mg nightly, lithium  CR 300 nightly,  venlafaxine  XR to 150 mg every morning, Auvelity  BID, tried various stimulants lately, gabapentin  900 mg HS, dexmethylphenidate  ER without help No side effects noted at the current dosage. Patient was administered Spravato  84 mg intranasally today.  The patient experienced the typical dissociation which gradually resolved over the 2-hour period of observation.  There were no complications.  Specifically the patient did not have nausea or vomiting.  Able to leave at end of 2 hour period without assistance.  Clear mood benefit with Spravato  with much less dep and wants to continue. Overall her mood is still significantly better with the combination of Spravato  venlafaxine  and Auvelity  in particular.  She has not had any problems from the reduction in venlafaxine  but is not dramatically improved with regard to residual depression since adding Auvelity  but she is not having side effects.  We agreed that a longer trial of the helpful.  She is also wondering about increasing the Spravato  frequency from once every other week to once every 10 days in order to prove improved residual depression and specifically anhedonia.  Overall though she is dramatically better with the current combination of meds.  AIMS    Flowsheet Row Video Visit from 01/03/2022 in [facility]  [facility]  AIMS Total Score 0      ECT-MADRS    Flowsheet Row Clinical Support from 08/13/2022 in [facility] Video Visit from 04/29/2022 in [facility] Video Visit from 02/06/2022 in [facility]  MADRS Total Score 27 44 23      GAD-7    Flowsheet Row Counselor from 12/19/2022 in [facility] Office Visit from 08/22/2022 in [facility] [place]  Total GAD-7 Score 4 7      PHQ2-9    Flowsheet Row Counselor from 12/19/2022 in [facility] Office Visit from 08/22/2022 in [facility] [place] Office Visit from 08/19/2022 in [facility] at [facility] Video Visit from 02/06/2022 in [facility] Office Visit from 11/16/2020 in [facility]  PHQ-2 Total Score 2 3 0 6 0  PHQ-9 Total Score 5 5 -- 14 --        Past Psychiatric Medication Trials: Trintellix - Initially effective and then was not effective when re-started Paxil - Ineffective. Helped initially. Prozac-Insomnia Lexapro - Effective and then no longer as effective Zoloft - Initially effective and then minimal response Viibryd  Cymbalta- Ineffective. Helped initially. Effexor  XR 300- Effective, well tolerated. Pristiq - Increased anxiety at 150 mg daily Wellbutrin  XL-Ineffective.May have increased anxiety. Amitriptyline Nortriptyline -Caused  irritability, constipation Auvelity - ineffective Selegilne brief 20 mg daily , more irritable  Abilify - Helpful but caused severe insomnia. Rexulti -Helpful but caused insomnia. Vraylar  Seroquel  - Ineffective Risperdal Olanzapine- Ineffective Latuda - Ineffective Caplyta   Klonopin - Effective Temazepam - Took during menopause Lunesta- Ineffective Ambien-Ineffective Sonata-  Ineffective Dayvigo - partially effective  Belsomra  20 worked and then failed  Trazodone -  somewhat effective Remeron-Ineffective. Does not recall wt gain.  Quetiapine  25 NR  Adderall- Took once and had adverse effect. Concerta - Effective but only 4-6 hours Jornay 80 NR Metadate -Not as effective as Concerta  Dexmethylphenidate  ER 10/02/21- Not as effective as Concerta  Azstarys - some side effects. Not as effective.  Ritalin  short acting and SE anxiety & HTN @20  TID Cotempla  17.3  Daytrana  NR  Lamictal- May have had an adverse effects. Felt weird. Reports worsening s/s.  Lithium  ? effect Gabapentin  Pramipexole - 1 mg QID NR  ECT #20 with minimal response. H says it was partly helpful.  AIMS    Flowsheet Row Video Visit from 01/03/2022 in [facility]  AIMS Total Score 0      ECT-MADRS    Flowsheet Row Clinical Support from 08/13/2022 in [facility] Video Visit from 04/29/2022 in [facility] Video Visit from 02/06/2022 in [facility]  MADRS Total Score 27 44 23      GAD-7    Flowsheet Row Counselor from 12/19/2022 in [facility] Office Visit from 08/22/2022 in [facility] [place]  Total GAD-7 Score 4 7      PHQ2-9    Flowsheet Row Counselor from 12/19/2022 in [facility] Office Visit from 08/22/2022 in [facility] [place] Office Visit from 08/19/2022 in [facility] Healthcare at [facility] Video Visit from 02/06/2022 in [facility] Office Visit from 11/16/2020 in [facility] Healthcare at [facility] Total Score 2 3 0 6 0  PHQ-9 Total Score 5 5 -- 14 --        Review of Systems:  Review of Systems  Constitutional:  Positive for fatigue.  Cardiovascular:  Negative for  palpitations.  Neurological:  Negative for dizziness, tremors and headaches.  Psychiatric/Behavioral:  Positive for dysphoric mood and sleep disturbance. Negative for agitation, confusion, decreased concentration, hallucinations and suicidal ideas. The patient is nervous/anxious.     Medications: I have reviewed the patient's current medications.  Current Outpatient Medications  Medication Sig Dispense Refill  . Dexmethylphenidate  HCl 40 MG CP24 Take 1 capsule (40 mg total) by mouth every morning. 15 capsule 0  . Dextroamphetamine  (XELSTRYM ) 13.5 MG/9HR PTCH Place 1 patch onto the skin every morning. 30 patch 0  . Dextromethorphan-buPROPion  ER (AUVELITY ) 45-105 MG TBCR Take 1 tablet by mouth 2 (two) times daily. 60 tablet 0  . Esketamine HCl, 84 MG Dose, (SPRAVATO , 84 MG DOSE,) 28 MG/DEVICE SOPK Place 84 mg into the nose once a week. 3 each 3  . Estradiol  (VAGIFEM ) 10 MCG TABS vaginal tablet Place 1 tablet (10 mcg total) vaginally 2 (two) times a week. 24 tablet 3  . lithium  carbonate (LITHOBID ) 300 MG ER tablet Take 2 tablets (600 mg total) by mouth at bedtime. 180 tablet 0  . traZODone  (DESYREL ) 50 MG tablet TAKE 1 TABLET BY MOUTH AT BEDTIME 30 tablet 0  . valACYclovir  (VALTREX ) 500 MG tablet Take one twice daily for 3 days with any symptoms 30 tablet  3  . valsartan  (DIOVAN ) 80 MG tablet Take 1 tablet (80 mg total) by mouth daily. 90 tablet 3  . venlafaxine  XR (EFFEXOR -XR) 150 MG 24 hr capsule Take 150 mg by mouth daily with breakfast.    . clonazePAM  (KLONOPIN ) 1 MG tablet Take 1-2 tablets (1-2 mg total) by mouth at bedtime. 60 tablet 0  . gabapentin  (NEURONTIN ) 300 MG capsule 2 at bedtime and 1-2 capsule if need for early morning awakening. 120 capsule 1   No current facility-administered medications for this visit.    Medication Side Effects: None  Allergies:  Allergies  Allergen Reactions  . Ciprofloxacin Hives  . Oxycodone-Acetaminophen  Hives    Past Medical History:   Diagnosis Date  . Adult acne   . Anemia   . Anorexia nervosa teen  . DEPRESSION 08/09/2009  . Hematoma 09/2020   post op after face lift  . Insomnia   . 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  . TRANSAMINASES, SERUM, ELEVATED 08/14/2009    Past Medical History, Surgical history, Social history, and Family history were reviewed and updated as appropriate.   Please see review of systems for further details on the patient's review from today.   Objective:   Physical Exam:  LMP 12/27/2007 (Exact Date)   Physical Exam Constitutional:      General: She is not in acute distress. Musculoskeletal:        General: No deformity.  Neurological:     Mental Status: She is alert and oriented to person, place, and time.     Coordination: Coordination normal.  Psychiatric:        Attention and Perception: Perception normal. She is inattentive. She does not perceive auditory hallucinations.        Mood and Affect: Mood is anxious and depressed. Affect is blunt. Affect is not tearful.        Speech: Speech normal.        Behavior: Behavior normal.        Thought Content: Thought content normal. Thought content is not delusional. Thought content does not include homicidal or suicidal ideation. Thought content does not include suicidal plan.        Cognition and Memory: Cognition and memory normal.        Judgment: Judgment normal.     Comments: Insight intact 4/10 dep but better with Spravato  markedly. Affect blunted but with a little more expression     Lab Review:     Component Value Date/Time   NA 132 (L) 10/27/2022 1107   K 4.4 10/27/2022 1107   CL 95 (L) 10/27/2022 1107   CO2 30 10/27/2022 1107   GLUCOSE 89 10/27/2022 1107   BUN 14 10/27/2022 1107   CREATININE 0.67 10/27/2022 1107   CREATININE 0.68 01/26/2020 0920   CALCIUM 10.1 10/27/2022 1107   PROT 8.3 04/22/2022 0932   ALBUMIN 5.3 (H) 04/22/2022 0932   AST 82 (H) 04/22/2022  0932   ALT 118 (H) 04/22/2022 0932   ALKPHOS 74 04/22/2022 0932   BILITOT 0.3 04/22/2022 0932   GFRNONAA >90 05/01/2014 1645   GFRAA >90 05/01/2014 1645       Component Value Date/Time   WBC 3.3 (L) 04/22/2022 0932   RBC 4.25 04/22/2022 0932   HGB 14.3 04/22/2022 0932   HGB 14.1 10/26/2014 1034   HCT 40.7 04/22/2022 0932   PLT 245.0 04/22/2022 0932   MCV 95.7 04/22/2022 0932   MCH 34.4 (  H) 01/26/2020 0920   MCHC 35.1 04/22/2022 0932   RDW 13.2 04/22/2022 0932   LYMPHSABS 0.9 04/22/2022 0932   MONOABS 0.2 04/22/2022 0932   EOSABS 0.1 04/22/2022 0932   BASOSABS 0.0 04/22/2022 0932    No results found for: POCLITH, LITHIUM    No results found for: PHENYTOIN, PHENOBARB, VALPROATE, CBMZ   .res Assessment: Plan:    Heather Davila was seen today for follow-up, depression, add, fatigue, anxiety and sleeping problem.  Diagnoses and all orders for this visit:  Recurrent major depression resistant to treatment (HCC)  Generalized anxiety disorder  Attention deficit hyperactivity disorder (ADHD), predominantly inattentive type  Insomnia, unspecified type -     clonazePAM  (KLONOPIN ) 1 MG tablet; Take 1-2 tablets (1-2 mg total) by mouth at bedtime. -     gabapentin  (NEURONTIN ) 300 MG capsule; 2 at bedtime and 1-2 capsule if need for early morning awakening.  Anxiety disorder, unspecified type  Anxiety -     gabapentin  (NEURONTIN ) 300 MG capsule; 2 at bedtime and 1-2 capsule if need for early morning awakening.     Pt seen for TRD with multiple failed medications as noted above.    Mood was reportedly better with venlafaxine  XR 300 with Spravato  but ongoing dep with anhedonia and fatigue.  Then she went to the [facility] and they rec Auvelity  and she reduced venlafaxine  on her own to 150 mg  and added Auvelity  BID.  Mood is still better.    There is an ongoing pattern of her making impulsive decisions on her own about meds which are counter therapeutic and not informing  MD of what is going on until after th fact.  That is not a helpful pattern and she is encouraged to stop it.  Patient was administered Spravato  84 mg intranasally today.  The patient experienced the typical dissociation which gradually resolved over the 2-hour period of observation.  There were no complications.  Specifically the patient did not have nausea or vomiting or headache.  Blood pressures remained within normal ranges at the 40-minute and 2-hour follow-up intervals.  By the time the 2-hour observation period was met the patient was alert and oriented and able to exit without assistance.  Patient feels the Spravato  administration is helpful for the treatment resistant depression and would like to continue the treatment.  See nursing note for further details. Would like to increase frequency from every other week to every 10 days looking for further improvement in depression.  Consider switch Spravato  to MAOI, Parnate  We discussed the short-term risks associated with benzodiazepines including sedation and increased fall risk among others.  Discussed long-term side effect risk including dependence, potential withdrawal symptoms, and the potential eventual dose-related risk of dementia.  But recent studies from 2020 dispute this association between benzodiazepines and dementia risk. Newer studies in 2020 do not support an association with dementia.  for insomnia, wants to continue clonazepam  2 mg nightly. Can't sleep without it.   Reports needing gabapentin  [phone] mg HS to help sleep.  Cotempla  or Concerta  bc helps mood early in day and focus  benefit lasts longer.  Have not been able to get stimulant to be consitently effective throughout the day. Conisder switching to patch bc pt has significant crash with stimulants or perhaps Jornay.  Stimulant tablets cause crash with inadequate duration .  Patches should provide smoother response.  Will try another patch, Xelstrym  if we can get it  approved.   Insurance denied PA for this medication stating she  has to try dextroamphetamine  ER, Adderall XR, and Vyvanse  before will be considered. Dexmethylphenidate  ER 10/02/21- Not as effective as Concerta  Have been to try Adderall XR because she did not tolerate Adderall at home I am reluctant to prescribe Vyvanse  for the same reason but we can try a low dose of Vyvanse  in hopes of avoiding side effects she had with Adderall.  Discussed potential benefits, risks, and side effects of stimulants with patient to include increased heart rate, palpitations, insomnia, increased anxiety, increased irritability, or decreased appetite.  Instructed patient to contact office if experiencing any significant tolerability issues.  Dont' decrease venlafaxine  further from 150 mg at this time.  Disc SE in detail and SSRI withdrawal sx.  Continue additional Auvelity  trial BID per rec of [facility].  Documents reviewed from them. She's tolerating combination and mood over all has been ok with beneifit from meds and Spravato . Her last trial with Auvelity  was fairly brief but she said it was not helpful.  It may be more helpful in combination with an SNRI which is appropriate given her treatment resistant depression diagnosis.  She is tolerating it well and full agreement.  To max opportunity to respond for tRD, lithium  to 600 mg PM.  Disc pretreating Spravato  with NSAID or Tylenol  bc of HA lately.  trazodone   50 mg HS  Supportive therapy dealing with serious stressful life event.  FU every 7-10 days  Lorene Macintosh, MD, DFAPA    Please see After Visit Summary for patient specific instructions.  Future Appointments  Date Time Provider Department Center  05/15/2023  9:00 AM Burnetta Almarie DASEN, [facility] None  05/21/2023  9:00 AM Cottle, Lorene KANDICE Raddle., MD CP-CP None  05/21/2023  9:00 AM CP-NURSE CP-CP None  05/29/2023  9:00 AM Burnetta Almarie DASEN, [facility] CP-CP None  06/09/2023  9:00 AM Burnetta Almarie DASEN,  [facility] CP-CP None  06/23/2023  9:00 AM Burnetta Almarie DASEN, [facility] CP-CP None  09/18/2023  8:15 AM Cleotilde Ronal RAMAN, MD [facility]    No orders of the defined types were placed in this encounter.   -------------------------------

## 2023-05-15 ENCOUNTER — Telehealth: Payer: Self-pay

## 2023-05-15 ENCOUNTER — Ambulatory Visit: Payer: 59 | Admitting: Professional Counselor

## 2023-05-15 NOTE — Telephone Encounter (Signed)
Prior Authorization submitted and approved for Spravato 84 mg effective 04/15/2023-05/14/2024 with [employer] [id number]

## 2023-05-21 ENCOUNTER — Encounter: Payer: 59 | Admitting: Psychiatry

## 2023-05-25 ENCOUNTER — Ambulatory Visit (INDEPENDENT_AMBULATORY_CARE_PROVIDER_SITE_OTHER): Payer: 59 | Admitting: Psychiatry

## 2023-05-25 ENCOUNTER — Other Ambulatory Visit: Payer: Self-pay

## 2023-05-25 ENCOUNTER — Other Ambulatory Visit: Payer: Self-pay | Admitting: Psychiatry

## 2023-05-25 ENCOUNTER — Ambulatory Visit: Payer: 59

## 2023-05-25 VITALS — BP 114/77 | HR 71

## 2023-05-25 DIAGNOSIS — G4700 Insomnia, unspecified: Secondary | ICD-10-CM | POA: Diagnosis not present

## 2023-05-25 DIAGNOSIS — F411 Generalized anxiety disorder: Secondary | ICD-10-CM

## 2023-05-25 DIAGNOSIS — F339 Major depressive disorder, recurrent, unspecified: Secondary | ICD-10-CM

## 2023-05-25 DIAGNOSIS — F900 Attention-deficit hyperactivity disorder, predominantly inattentive type: Secondary | ICD-10-CM

## 2023-05-25 MED ORDER — AUVELITY 45-105 MG PO TBCR: 1.0000 | EXTENDED_RELEASE_TABLET | Freq: Two times a day (BID) | ORAL | 2 refills | Status: DC

## 2023-05-25 MED ORDER — LISDEXAMFETAMINE DIMESYLATE 40 MG PO CAPS: 40.0000 mg | ORAL_CAPSULE | ORAL | 0 refills | Status: DC

## 2023-05-25 NOTE — Patient Instructions (Signed)
Regarding stimulants,  can't get the patch until trial of Vyvanse 40 mg capsule, 1 in the morning.

## 2023-05-25 NOTE — Progress Notes (Signed)
Heather Davila [id number] [birth year] 63 y.o.  Subjective:   Patient ID:  Heather Davila is a 63 y.o. (DOB 1961-04-02) female.  Chief Complaint:  Chief Complaint  Patient presents with   Follow-up   Depression   Anxiety   ADD   Sleeping Problem   Medication Reaction    HPI Heather Davila presents to the office today for follow-up of treatment resistant recurrent depression, generalized anxiety disorder and insomnia.  Almost too numerous to count med failures. She is here for Spravato administration for her treatment resistant depression.  05/21/22 appt noted: Patient was administered first dose Spravato 56 mg intranasally today.  The patient experienced the typical dissociation which gradually resolved over the 2-hour period of observation.  There were no complications.  Specifically the patient did not have nausea or vomiting or headache.   Dissociation was mild. Did not experience any emotional relief from disabling depression.wants to increase Spravato to the usual dose.  She is aware insurance is not covering the costs at this time.  No SI acutely. Tolerating meds.    05/23/22 appt noted: Patient was administered Spravato 84 mg intranasally today.  The patient experienced the typical dissociation which gradually resolved over the 2-hour period of observation.  There were no complications.  Specifically the patient did not have nausea or vomiting.   Dissociation was greater than last dose and a little bothersome DT some HA. Tolerating meds.   Mood has not changed significantly thus far with Spravato.  05/27/22 appt noted: Patient was administered Spravato 84 mg intranasally today.  The patient experienced the typical dissociation which gradually resolved over the 2-hour period of observation.  There were no complications.  Specifically the patient did not have nausea or vomiting or headache. Today she has felt a little relief from the pressing heaviness of depression but a little more  anxiety.  During administration of Spravato she listens to [place] music and was able to relax a little more with the feeling of dissociation that was mildly bothersome. Husband was also in on the session today and asked some questions about IV ketamine versus nasal spray ketamine.  05/29/22 appt noted: Cancelled today DT hypertension  05/30/22 appt noted: Patient was administered Spravato 84 mg intranasally today.  The patient experienced the typical dissociation which gradually resolved over the 2-hour period of observation.  There were no complications.  Specifically the patient did not have nausea or vomiting or headache. She is seeing some improvement in mood with less continuous depression and less intensity.  Hopefulness is better.   No med complaints or SE  06/05/22 appt noted: Patient was administered Spravato 84 mg intranasally today.  The patient experienced the typical dissociation which gradually resolved over the 2-hour period of observation.  There were no complications.  Specifically the patient did not have nausea or vomiting or headache.  Specifically HA is less of a problem with Spravato. No SE with meds. Depression is improving with less intensity.  Intermittent anxiety easily.  More able to enjoy things.  No new concerns.  06/17/22 appt noted: Patient was administered Spravato 84 mg intranasally today.  The patient experienced the typical dissociation which gradually resolved over the 2-hour period of observation.  There were no complications.  Specifically the patient did not have nausea or vomiting or headache.  Specifically HA is less of a problem with Spravato. No SE with meds. Depression is improved 45% with less intensity.  Intermittent anxiety less easily.  More able to enjoy things.  No  new concerns.  More active. Current meds: increased clonazepam to about 1.5 mg HS DT recent insomnia, lexapro 20, Dayvigo 10 mg HS, concerta 36 mg AM, trazodone 100 mg HS.  06/20/22 appt  noted: Current meds: increased clonazepam to about 1.5 mg HS DT recent insomnia, lexapro 20, Dayvigo 10 mg HS, concerta 36 mg AM, trazodone 100 mg HS. Patient was administered Spravato 84 mg intranasally today.  The patient experienced the typical dissociation which gradually resolved over the 2-hour period of observation.  There were no complications.  Specifically the patient did not have nausea or vomiting or headache.  Specifically HA is less of a problem with Spravato. No SE with meds. Depression is improved 45% with less intensity.  Intermittent anxiety less easily.  More able to enjoy things.  No new concerns.  More active.  06/23/22 appt noted:  Current meds: increased clonazepam to about 1.5 mg HS DT recent insomnia, lexapro 20, Dayvigo 10 mg HS, concerta 36 mg AM, trazodone 100 mg HS. Patient was administered Spravato 84 mg intranasally today.  The patient experienced the typical dissociation which gradually resolved over the 2-hour period of observation.  There were no complications.  Specifically the patient did not have nausea or vomiting or headache.  Specifically HA is less of a problem with Spravato. No SE with meds. Intensity of Spravato dissociation varies.  Last week very intesne and this week more typical.  Depression is continuing to improve with better interest and activity and motivation.  Gradual improvement.  Wants to continue.  06/25/22 appt noted: Current meds: increased clonazepam to about 1.5 mg HS DT recent insomnia, lexapro 20, Dayvigo 10 mg HS, concerta 36 mg AM, trazodone 100 mg HS. Patient was administered Spravato 84 mg intranasally today.  The patient experienced the typical dissociation which gradually resolved over the 2-hour period of observation.  There were no complications.  Specifically the patient did not have nausea or vomiting or headache.  Specifically HA is less of a problem with Spravato. No SE with meds. Intensity of Spravato dissociation varies.  No  scary and well tolerated. Continues to gradually improve with Spravato.  She is more motivated and enjoying things more.  H sees progress.  Wants to continue twice weekly until gets maximum response.  Dep is not gone yet.  06/30/22 appt noted:  seen with H Current meds: increased clonazepam to about 1.5 mg HS DT recent insomnia, lexapro 20, Dayvigo 10 mg HS, concerta 36 mg AM, trazodone 100 mg HS. Patient was administered Spravato 84 mg intranasally today.  The patient experienced the typical dissociation which gradually resolved over the 2-hour period of observation.  There were no complications.  Specifically the patient did not have nausea or vomiting or headache.  Specifically HA is less of a problem with Spravato. No SE with meds. Intensity of Spravato dissociation varies.  No scary and well tolerated. Continues to gradually improve with Spravato.  She is more motivated and enjoying things more.  H sees even more progress than she does; more active and smiling.  Wants to continue twice weekly until gets maximum response.  Dep is 80% better.  07/07/22 appt noted: Current meds: increased clonazepam to about 1.5 mg HS DT recent insomnia, lexapro 20, Dayvigo 10 mg HS, concerta 36 mg AM, trazodone 100 mg HS. Patient was administered Spravato 84 mg intranasally today.  The patient experienced the typical dissociation which gradually resolved over the 2-hour period of observation.  There were no complications.  Specifically the  patient did not have nausea or vomiting or headache.  Specifically HA is less of a problem with Spravato. No SE with meds. Intensity of Spravato dissociation varies.  No scary and well tolerated.  Was more intese today. Overall mood is markedly better and please with meds. No changes desired.  07/09/22 appt noted: In transition from Lexapro to sertraline and missing Concerta bc CO crash from it after about 4 hours.  Disc these concerns.  She'll discuss also with Corie Chiquito,  NP More dep the last couple of days and concerned about reducing Spravato frequency while transition from one antidep to another.  Affect less positive. Patient was administered Spravato 84 mg intranasally today.  The patient experienced the typical dissociation which gradually resolved over the 2-hour period of observation.  There were no complications.  Specifically the patient did not have nausea or vomiting or headache.  Specifically HA is less of a problem with Spravato. No SE with meds. Intensity of Spravato dissociation varies.  No scary and well tolerated.   07/14/22 appt noted: Has been more depressed this week.  More trouble with sleep.  Taking clonazepam 1-1.5  of the 0.5 mg tablets.   Trazodone not helping much. She switched back from 200 sertraline to Lexapro 20.  No SE More trouble with motivation.  Some stress with son with special needs. Misses the benevit of Concerta but it was too short acting and crashed in 4-6 hours. Patient was administered Spravato 84 mg intranasally today.  The patient experienced the typical dissociation which gradually resolved over the 2-hour period of observation.  There were no complications.  Specifically the patient did not have nausea or vomiting or headache.  Specifically HA is less of a problem with Spravato. No SE with meds. Intensity of Spravato dissociation varies.  No scary and well tolerated.   07/17/22 appt noted: Patient was administered Spravato 84 mg intranasally today.  The patient experienced the typical dissociation which gradually resolved over the 2-hour period of observation.  There were no complications.  Specifically the patient did not have nausea or vomiting or headache.  Specifically HA is less of a problem with Spravato. No SE with meds. Intensity of Spravato dissociation varies.  Not scary and well tolerated.  Just got Jornay last night but wasn't sure how to take it.  Wants to start and this was discussed.  She felt MPH helped  mood and attn but didn't last long enough and had crash after a few hours on Concerta.  Better for 2 hours after each dose Ritalin 20 mg with mood but then it wore off.  BP up after 3rd dose 160/90 and then took H's amlodipine 5 and BP became normal.  07/22/22 appt noted: Patient was administered Spravato 84 mg intranasally today.  The patient experienced the typical dissociation which gradually resolved over the 2-hour period of observation.  There were no complications.  Specifically the patient did not have nausea or vomiting or headache.  Specifically HA is less of a problem with Spravato. No SE with meds. Intensity of Spravato dissociation varies.  Not scary and well tolerated.  Start Jornay 20 mg over the weekend and did not notice any particular effect good or bad.  Increased to 40 mg. Other psych meds: Clonazepam 0.5 mg tablets 2 nightly, gabapentin dosage varies, Dayvigo 10 mg nightly, Lexapro 20 mg daily, to trazodone 100 mg nightly  07/24/22 appt noted: Patient was administered Spravato 84 mg intranasally today.  The patient experienced the typical  dissociation which gradually resolved over the 2-hour period of observation.  There were no complications.  Specifically the patient did not have nausea or vomiting or headache.  Specifically HA is less of a problem with Spravato. No SE with meds. Intensity of Spravato dissociation varies.  Not scary and well tolerated.  Other psych meds: Clonazepam 0.5 mg tablets 2 nightly, gabapentin dosage varies, Dayvigo 10 mg nightly, Lexapro 20 mg daily, to trazodone 100 mg nightly, Jornay 40 mg PM Doing well with Spravato.  Noticed a little effect from the Jornay 40 without SE but would like to increase the dose for ADD and mood.  Sleeping well.  No new concerns.  Still more depressed than she was with th initial benefit of Spravato.  07/30/22 note:  Patient was administered Spravato 84 mg intranasally today.  The patient experienced the typical  dissociation which gradually resolved over the 2-hour period of observation.  There were no complications.  Specifically the patient did not have nausea or vomiting or headache.  Specifically HA is less of a problem with Spravato. Other psych meds: Clonazepam 0.5 mg tablets 2 nightly, gabapentin dosage varies, Dayvigo 10 mg nightly, Lexapro 20 mg daily, to trazodone 100 mg nightly, Jornay 60 mg PM Wants to increase Jornay to 80 mg PM to increase benefit for ADD and depression. No SE She has experienced a major family crisis that threatens to change her daily life from now on.  It has not triggered suicidal thoughts at this time but she is very afraid.  No desire to change medications.  08/04/22 appt noted" Patient was administered Spravato 84 mg intranasally today.  The patient experienced the typical dissociation which gradually resolved over the 2-hour period of observation.  There were no complications.  Specifically the patient did not have nausea or vomiting or headache.  Specifically HA is less of a problem with Spravato. Other psych meds: Clonazepam 0.5 mg tablets 2 nightly, gabapentin dosage varies, Dayvigo 10 mg nightly, Lexapro 20 mg daily, trazodone 100 mg nightly, Jornay 80 mg PM No SE No benefit or SE with increase Jormay 80 mg.  Says Concerta helped ADD and depression but not Jornay.  However duration was insufficient.  Still depressed and wants change to another form of stimulant.  No concerns with other meds. Continues with strong family stressors creating uncertainty about her future but no quick resolution. No SI Trial Cotempla 17.3 and DC Ritalin & Jornay 80  08/11/2022 appointment noted: Patient was administered Spravato 84 mg intranasally today.  The patient experienced the typical dissociation which gradually resolved over the 2-hour period of observation.  There were no complications.  Specifically the patient did not have nausea or vomiting or headache.  Specifically HA is less  of a problem with Spravato now vs when started it.. Other psych meds: Clonazepam 0.5 mg tablets 2 nightly, gabapentin dosage varies, Dayvigo 10 mg nightly, trazodone 100 mg nightly, cotempla 17.3, switch from Lexapro 20 to sertaline 15o mg . No noticeable effects sertraline from for depression but has seen some antianxiety effects from the sertraline.  No side effects noted.  No side effects with the other medicines.  Still is only getting very brief benefit 3 to 4 hours from Cotempla for ADD and mood.  She wants to try the Daytrana patch as we have discussed before. No SE  08/13/22 appt: Patient was administered Spravato 84 mg intranasally today.  The patient experienced the typical dissociation which gradually resolved over the 2-hour period of observation.  There were no complications.  Specifically the patient did not have nausea or vomiting or headache.  Specifically HA is less of a problem with Spravato now vs when started it.. Other psych meds: Clonazepam 0.5 mg tablets 2 nightly, gabapentin dosage varies, Dayvigo 10 mg nightly, trazodone 100 mg nightly, cotempla 17.3, switch from Lexapro 20 to sertaline 15o mg . No noticeable effects sertraline from for depression but has seen some antianxiety effects from the sertraline.  No side effects noted.  No side effects with the other medicines.  Still is only getting very brief benefit 3 to 4 hours from Cotempla for ADD and mood.  She wants to try the Daytrana patch as we have discussed before.  Hasn't been able to get it yet. No SE Plan.  Continue to try to get Daytrana 30 AM  08/18/22 appt noted: Patient was administered Spravato 84 mg intranasally today.  The patient experienced the typical dissociation which gradually resolved over the 2-hour period of observation.  There were no complications.  Specifically the patient did not have nausea or vomiting or headache.  Specifically HA is less of a problem with Spravato now vs when started it.. Other  psych meds: Clonazepam 0.5 mg tablets 2 nightly, gabapentin dosage varies, Dayvigo 10 mg nightly, trazodone 100 mg nightly, cotempla 17.3, switch from Lexapro 20 to sertaline 15o mg . No noticeable effects sertraline from for depression but has seen some antianxiety effects from the sertraline.  No side effects noted.  No side effects with the other medicines.  Still is only getting very brief benefit 3 to 4 hours from Cotempla for ADD and mood.  She wants to try the Daytrana patch as we have discussed before.  Hasn't been able to get it yet.  Overall depression is some better than it was.   No SE Plan.  Continue to try to get Daytrana 30 AM  08/25/22 appt : No benefit Daytrana.  No SE.  Wants further med changes and interested In retrying pramipexole.   Tolerating meds. Patient was administered Spravato 84 mg intranasally today.  The patient experienced the typical dissociation which gradually resolved over the 2-hour period of observation.  There were no complications.  Specifically the patient did not have nausea or vomiting or headache.  Specifically HA is less of a problem with Spravato now vs when started it.. Other psych meds: Clonazepam 0.5 mg tablets 2 nightly, gabapentin dosage varies, Dayvigo 10 mg nightly, trazodone 100 mg nightly, , switch from Lexapro 20 to sertaline 15o mg . Daytrana 30 AM for a few days. Struggles with depression and no SI.  Reduced interest and mood and motivation and enjoyment and socialization.  08/27/22 appt noted: Psych meds:  Clonazepam 0.5 mg tablets 2 nightly, gabapentin dosage varies, Dayvigo 10 mg nightly, trazodone 100 mg nightly,  sertaline 15o mg . Returned to [employer] AM, pramipexole  Tolerating meds. Patient was administered Spravato 84 mg intranasally today.  The patient experienced the typical dissociation which gradually resolved over the 2-hour period of observation.  There were no complications.  Specifically the patient did not have nausea or vomiting  or headache.   Reports taking cotempla bc brief mood benefit and sig focus and productivity benefit. Took 1 dose pramipexole and felt more dep.  09/01/22 appt noted: Psych meds:  Clonazepam 0.5 mg tablets 2 nightly, gabapentin dosage varies, Dayvigo 10 mg nightly, trazodone 100 mg nightly,  sertaline 15o mg . Returned to Cotempla AM, pramipexole  0.25 mg BID. Tolerating  meds now. Patient was administered Spravato 84 mg intranasally today.  The patient experienced the typical dissociation which gradually resolved over the 2-hour period of observation.  There were no complications.  Specifically the patient did not have nausea or vomiting or headache.   Willing to try the pramipexole and will continue 0.25 mg BID this week and then consider increase if tolerated.  Mood is a little better.  Still looking for further improvement.  09/03/22 appt noted: Psych meds:  Clonazepam 0.5 mg tablets 2 nightly, gabapentin dosage varies, Dayvigo 10 mg nightly, trazodone 100 mg nightly,  sertaline 15o mg . Returned to Cotempla AM, pramipexole  0.25 mg tablet 1 and 1/2 tablets twice daily BID. Tolerating meds now. Patient was administered Spravato 84 mg intranasally today.  The patient experienced the typical dissociation which gradually resolved over the 2-hour period of observation.  There were no complications.  Specifically the patient did not have nausea or vomiting or headache.   Depression may be a little better with the parmipexole and is tolerating it well so far.  Still struggling with focus and residual depression.  Tolerating meds without SE.  More hopeful and able to enjoy some things.  09/08/22 appt  Psych meds:  Clonazepam 0.5 mg tablets 2 nightly, gabapentin dosage varies, Dayvigo 10 mg nightly, trazodone 100 mg nightly,  sertaline 15o mg . Returned to Cotempla AM, pramipexole  0.5 mg BID. Tolerating meds now. Patient was administered Spravato 84 mg intranasally today.  The patient experienced the  typical dissociation which gradually resolved over the 2-hour period of observation.  There were no complications.  Specifically the patient did not have nausea or vomiting or headache.   She has seen her depression drop from a 10/10 prior to treatment with Spravato to now 4/10 With additional benefit noted when she increased the pramipexole to 0.5 mg twice daily.  She is not having side effects with this like she did in the past. Anxiety levels are also improved. She is sleeping okay with the current medications. Plan: continue pramipexole trial off-label and increase more slowly for TRD.  Increase  Gradually  to 0.75 mg BID  09/11/22 appt noted: Psych meds:  Clonazepam 0.5 mg tablets 2 nightly, gabapentin dosage varies, Dayvigo 10 mg nightly, trazodone 100 mg nightly,  sertaline 15o mg . Returned to Cotempla AM, pramipexole  0.75 mg BID. Tolerating meds now. Patient was administered Spravato 84 mg intranasally today.  The patient experienced the typical dissociation which gradually resolved over the 2-hour period of observation.  There were no complications.  Specifically the patient did not have nausea or vomiting or headache.   She has seen her depression drop from a 10/10 prior to treatment with Spravato to now 4/10 With additional benefit noted when she increased the pramipexole to 0.5 mg twice daily.  She is not having side effects with this like she did in the past. Clearly improving with the pramipexole now and tolerating it.  Satisfied with current meds but would consider increasing if it might be helpful.  09/15/22 appt noted: Psych meds:  Clonazepam 0.5 mg tablets 2 nightly, gabapentin dosage varies, Dayvigo 10 mg nightly, trazodone 100 mg nightly,  sertaline 15o mg . Returned to Cotempla AM, pramipexole  0.75 mg BID too anxious and reduced to 0.5 mg BID. Tolerating meds now. Patient was administered Spravato 84 mg intranasally today.  The patient experienced the typical dissociation  which gradually resolved over the 2-hour period of observation.  There were no  complications.  Specifically the patient did not have nausea or vomiting or headache.   Trouble staying asleep longer than 6 hours.  Doesn't feel she can tolerate pramipexole 0.75 mg BID DT anxiety not experienced at  0.5 mg BID.  Otherwise tolerating meds.  Mood is better with Spravato and pramipexole.   Not sleeping as long with meds.  EMA 6 hours.  Wonders about change Disc concerns about waiting for therapy. Feels ready to reduce Spravato to weekly.    09/25/22 appt noted: Psych meds:  Clonazepam 0.5 mg tablets 2 nightly, gabapentin dosage varies, Dayvigo 10 mg nightly, trazodone 100 mg nightly,  sertaline 15o mg . Returned to Cotempla AM, pramipexole  0.75 mg BID too anxious and reduced to 0.5 mg BID. Tolerating meds now. Patient was administered Spravato 84 mg intranasally today.  The patient experienced the typical dissociation which gradually resolved over the 2-hour period of observation.  There were no complications.  Specifically the patient did not have nausea or vomiting or headache.   Trouble staying asleep longer than 6 hours.  Failed to respond to Seroquel 25 mg HS.  Gone back to [facility].  Belsomra NR. Mood ok until the last few days more dep bc it's been 10 days since last admin.  Wants to continue weekly.   No SE.  No med changes desired.  09/29/22 appt noted: Psych meds:  Clonazepam 0.5 mg tablets 2 nightly, gabapentin dosage varies, Dayvigo 10 mg nightly, trazodone 100 mg nightly,  sertaline 15o mg . Returned to Cotempla AM, pramipexole  0.75 mg BID too anxious and reduced to 0.5 mg BID. Tolerating meds now. Patient was administered Spravato 84 mg intranasally today.  The patient experienced the typical dissociation which gradually resolved over the 2-hour period of observation.  There were no complications.  Specifically the patient did not have nausea or vomiting or headache.   Trouble staying asleep  longer than 6 hours.  Plan: Continue sertaline to 200 mg daily.  It has helped anxiety but not depression.   for insomnia, clonazepam 1 mg HS. trazodone doesn't work well but helps some Continue Dayvigo 10 HS.  Failed resp Belsomra 15 and seroquel 25 She wants to continue Cotempla or Concerta bc helps mood early in day and focus  benefit lasts longer.  Have not been able to get stimulant to be consitently effective throughout the day. continue pramipexole trial off-label and reduce back to  TRD 0.5 mg BID Spravato weekly.  10/13/22 appt noted: Not sleeping as well.  Wants to increase Spravato to twice weekly bc feeling more dep close to the next sched date.  Just the day or 2 before.  .  Plan: Trial for off label TRD increase pramipexole 1 mg TID  10/20/22 appt noted: Psych meds:  Clonazepam 0.5 mg tablets 2 nightly, gabapentin dosage varies, Dayvigo 10 mg nightly, trazodone 100 mg nightly,  sertaline 15o mg . Returned to Cotempla AM, pramipexole  1 mg TID. Tolerating meds now. Patient was administered Spravato 84 mg intranasally today.  The patient experienced the typical dissociation which gradually resolved over the 2-hour period of observation.  There were no complications.  Specifically the patient did not have nausea or vomiting or headache.   Trouble staying asleep longer than 6 hours.  More dep this week and asked to get Spravato twice this week. Plan: Trial for off label TRD increase pramipexole 1 mg QID  10/22/22 appt noted:  Psych meds:  Clonazepam 0.5 mg tablets 2 nightly and  another one with EMA, gabapentin dosage varies, Belsomra 20 mg daily,  trazodone 100 mg nightly,  sertaline 15o mg . Returned to Cotempla AM, pramipexole  1 mg QID for 2nd day Tolerating meds now.  Except a little N and dizzy briefly after takes it. Patient was administered Spravato 84 mg intranasally today.  The patient experienced the typical dissociation which gradually resolved over the 2-hour period of  observation.  There were no complications.  Specifically the patient did not have nausea or vomiting or headache. No change in mood yet.   Sleep better with Belsomra 20 as long as takes with other meds.  If insomnia mood is worse. Ongoing some dep but gets partial relief with Spravato but would like better relief .  10/27/22 received Spravato 84  11/03/22 appt noted:  Meds as above except sertraline she cut to 100 mg instead of 200 mg daily She doesn't think sertraline helping and worries over poss SE Off and on Cotempla .  Not affecting BP. Doesn't notice benefit or SE with pramipexole 1 mg QID.  No SE Sleep is OK.  Some awakening aftter 6 hours but not enough so takes another clonazepam.   Patient was administered Spravato 84 mg intranasally today.  The patient experienced the typical dissociation which gradually resolved over the 2-hour period of observation.  There were no complications.  Specifically the patient did not have nausea or vomiting or headache. No change in mood yet.   Sleep better with Belsomra 20 as long as takes with other meds.  If insomnia mood is worse. Ongoing some dep but gets partial relief with Spravato but would like better relief .  No sig benefit with pramipexole 1 mg QID.  11/21/22 TC:  To ER with dep 11/21/22. No SE with selegiline but feels more dep.  Wants to stop it. Today is only day 2 of 1 tablet of selegiline 5 mg AM.  Reviewed with her that she had not been satisfied with the response of Spravato and Zoloft and that she is likely feeling worse because the Zoloft is wearing off and in particular because she abruptly stopped 150 mg daily.  She needs to give the selegiline time to work.  She agrees to do so.  She admitted to some passive suicidal thoughts but no active suicidal thoughts and no desire to die.  She agrees to the plan to increase selegiline to 10 mg every morning this weekend and to continue the selegiline trial.  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  She is aware.  Meredith Staggers, MD, DFAPA  12/19/22 TC: Pt called at 1:42p.  She said she wanted to know if she could start weaning off the Selegiline.  She thinks increasing the dosage is making things worse.  She said she wants to go back to [employer].  She acknowleged she has an appt on Monday. I advised her that I wasn't sure Dr Jennelle Human will see her message before her appt, but she still ask me to send the message.  Next appt 8/26 MD resp.  No change until Monday appt.  12/22/22 appt noted: Meds: selegiline up to 5 mg 4 daily for a week but felt it incr irritability and went back to 3 mg daily. She wants to go back to Spravato.  Not near as dep as now.  Selegiline didn't help energy. Still on Belsomra Thinks maybe sertraline helped mood more than she thought. More dep  than anxiety.   Plan: DC selegiline and will retry Effexor which helped in the past at 300 mg daily.  Start this Friday after over 10 half lives of selegiline.    12/24/22 appt noted: Stopped selegiline  Psych med: belsomra 20, clonazepam 1 mg HS.   She has felt more dep off the Spravato though at the time didn't think it was helping.  Has felt more down and withdrawn.  Low energy, motivation, not smiling.  No SI.  Sleep ok.   Patient was administered Spravato 84 mg intranasally today.  The patient experienced the typical dissociation which gradually resolved over the 2-hour period of observation.  There were no complications.  Specifically the patient did not have nausea or vomiting or headache. No problems with BP or other unusual SE Mood is a little better after this administration of Spravato.  Very motivated to continue it.  No immediate med concerns. No SI .  Ongoing anhedonia.  12/25/22 appt noted: Psych med: belsomra 20, clonazepam 1 mg HS.  Not resumed stimulant or AD yet   No SE. Patient was administered Spravato 84 mg intranasally today.  The patient experienced  the typical dissociation which gradually resolved over the 2-hour period of observation.  There were no complications.  Specifically the patient did not have nausea or vomiting or headache.  There is a lot things going on in her life and it is somewhat difficult to separate stressors from what is going on with her mood.  However she is happy to resume Spravato as she decided after stopping briefly that it was helpful.  She plans to start the antidepressant Morrow.  She also wants to resume the stimulant tomorrow.  12/30/22 appt noted; Psych med: belsomra 20, clonazepam 1 mg HS.  Not resumed stimulant.  Effexor XR 37.5 mg daily   No SE. Patient was administered Spravato 84 mg intranasally today.  The patient experienced the typical dissociation which gradually resolved over the 2-hour period of observation.  There were no complications.  Specifically the patient did not have nausea or vomiting or headache.  She is still having depression with very prominent anhedonia.  However she feels the Spravato is somewhat helpful and is hopeful for more complete response as she gets back onto a reasonable antidepressant dose.  She tolerated Effexor XR 300 mg daily in the past and took it for 5 months and it is probably the antidepressants worked best for her.  She is agreeable with this plan.  She is getting sleep benefit from the current medications Plan: incr Effexor 75 mg daily  01/02/23 appt noted: Psych meds as noted above except increase Effexor XR to 75 mg daily without side effects. Patient was administered Spravato 84 mg intranasally today.  The patient experienced the typical dissociation which gradually resolved over the 2-hour period of observation.  There were no complications.  Specifically the patient did not have nausea or vomiting or headache.  Agrees to increasing Effexor as planned.  SX not changed from that noted above.  No SI  01/05/23 appt noted: Psych meds as noted above except increase Effexor  XR to 112.5 mg daily without side effects. Patient was administered Spravato 84 mg intranasally today.  The patient experienced the typical dissociation which gradually resolved over the 2-hour period of observation.  There were no complications.  Specifically the patient did not have nausea or vomiting or headache.  Agrees to increasing Effexor as planned gradually to 300 mg daily.  SX not changed  from that noted above.  Does however feel Spravato has dampened the degree of depression.  No SI Stressful life event discussed and won't be resolved until early next year.  01/07/23 appt noted: Psych meds Effexor XR 112.5 mg daily. Patient was administered Spravato 84 mg intranasally today.  The patient experienced the typical dissociation which gradually resolved over the 2-hour period of observation.  There were no complications.  Specifically the patient did not have nausea or vomiting or headache.  Agrees to increasing Effexor as planned gradually to 300 mg daily.  SX not changed from that noted above.  Does however feel Spravato has dampened the degree of depression markedly compared to when off psych meds and no Spravato.  No SI  01/12/23 appt noted; Psych meds Effexor XR 225 mg daily. No SE notedl. Patient was administered Spravato 84 mg intranasally today.  The patient experienced the typical dissociation which gradually resolved over the 2-hour period of observation.  There were no complications.  Specifically the patient did not have nausea or vomiting.  But is having headache.  Agrees to increasing Effexor as planned gradually to 300 mg daily.  SX not changed from that noted above.  Does however feel Spravato has dampened the degree of depression markedly compared to when off psych meds and no Spravato.  No SI  01/15/23 appt noted:  Psych meds Effexor XR 225 mg daily. No SE noted. Patient was administered Spravato 84 mg intranasally today.  The patient experienced the typical dissociation which  gradually resolved over the 2-hour period of observation.  There were no complications.  Specifically the patient did not have nausea or vomiting.   Doing ok with higher doses of Effexor and will plan to increase to 300 mg daily. Mild improvement noticed so far with the incrase in meds.   Uses Cotempla prn and it helps but doesn't liek the way she feels when it wears off.  01/22/23 appt noted: Psych meds reduced Effexor XR 150 mg daily. Bc of HA No SE noted. Patient was administered Spravato 84 mg intranasally today.  The patient experienced the typical dissociation which gradually resolved over the 2-hour period of observation.  There were no complications.  Specifically the patient did not have nausea or vomiting.  Ongoing moderate levels of depression with some improvement from the Spravato.  She is not sure about the effect of the venlafaxine on her mood yet.  She would like to continue to try pursuing a stimulant but has had difficulty tolerating p.o. versions of stimulants because of the crash at the end and side effects with many of them.  She has several questions about this.  01/27/23 appt noted: Psych meds: Clonazepam 1 to 1.5 mg nightly, lithium CR 300 nightly, Belsomra 20 nightly, venlafaxine XR 150 every morning No side effects noted at the current dosage. Patient was administered Spravato 84 mg intranasally today.  The patient experienced the typical dissociation which gradually resolved over the 2-hour period of observation.  There were no complications.  Specifically the patient did not have nausea or vomiting.  Ongoing moderate levels of depression with some improvement from the Spravato.   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  She does not tolerate oral stimulants very well because she has a crash as they wear off which is very dysphoric.  She is willing to retry the Daytrana patches that she took in the spring.   She took 1 patch  on 1 occasion recently and felt it was helpful. However she is also having problems with early morning awakening over the last week without precipitant.  She would like to have a different treatment strategy for sleep but understands her sleep problems have historically been difficult to resolve and maintain.  02/10/23 appt noted: Psych meds: Clonazepam 1 to 1.5 mg nightly, lithium CR 300 nightly, Belsomra 20 nightly, venlafaxine XR 150 every morning, Daytrana 30 mg Am No side effects noted at the current dosage. Patient was administered Spravato 84 mg intranasally today.  The patient experienced the typical dissociation which gradually resolved over the 2-hour period of observation.  There were no complications.  Specifically the patient did not have nausea or vomiting.  Ongoing moderate levels of depression with some improvement from the Spravato.  Particularly anhedonia.  No sig benefit noted with Daytrana and problems with table stimulants bc gets brief benefit with unpleasant "crash".  Wants to try another type of patch if available.  02/17/23 appt noted: Psych meds: Clonazepam 1 to 1.5 mg nightly, lithium CR 300 nightly, Belsomra 20 nightly, venlafaxine XR 225 mg every morning, Daytrana 30 mg Am No side effects noted at the current dosage. Patient was administered Spravato 84 mg intranasally today.  The patient experienced the typical dissociation which gradually resolved over the 2-hour period of observation.  There were no complications.  Specifically the patient did not have nausea or vomiting.  Ongoing moderate levels of depression with some improvement from the Spravato.  Particularly anhedonia.  She has not gotten the new stimulant yet but is hopeful. No problems with increasing venlafaxine.   Mood is better with Spravato and venlafaxine.  Less anhedonia, less down. Themazepam not helpful.    03/04/23 received Spravato 84 03/12/23 received Spravato 84  03/19/23 appt  noted: Psych meds: Clonazepam 1 to 1.5 mg nightly, lithium CR 300 nightly, Belsomra 20 nightly, venlafaxine XR 300 mg every morning, tried various stimulants lately No side effects noted at the current dosage. Patient was administered Spravato 84 mg intranasally today.  The patient experienced the typical dissociation which gradually resolved over the 2-hour period of observation.  There were no complications.  Specifically the patient did not have nausea or vomiting.  Not getting good duration from oral stimulants.  She would like to try another patch but has to take tabs first and try them before insurance will cover patches.  Overall dep is better with meds and Spravato.  Clearly sees benefit.  03/30/23 appt noted: Psych meds: Clonazepam 1 to 1.5 mg nightly, lithium CR 300 nightly,  venlafaxine XR 300 mg every morning, tried various stimulants lately, gabapentin 900 mg HS, dexmethylphenidate ER without help No side effects noted at the current dosage. Patient was administered Spravato 84 mg intranasally today.  The patient experienced the typical dissociation which gradually resolved over the 2-hour period of observation.  There were no complications.  Specifically the patient did not have nausea or vomiting.  Able to leave at end of 2 hour period without assistance.  Clear mood benefit with Spravato with much less dep and wants to continue. Still trouble sleeping. TR insomnia.  Ongoing conc problems with minimal brief benefit from stimulants.  Not able to get patch Caprice Red yet DT insurance.  04/14/23 appt noted: Psych meds: Clonazepam 1 to 1.5 mg nightly, lithium CR 300 nightly,  she reduced on her own venlafaxine XR to 150 mg every morning, tried various stimulants lately, gabapentin 900 mg HS, dexmethylphenidate ER without help No  side effects noted at the current dosage. Patient was administered Spravato 84 mg intranasally today.  The patient experienced the typical dissociation which  gradually resolved over the 2-hour period of observation.  There were no complications.  Specifically the patient did not have nausea or vomiting.  Able to leave at end of 2 hour period without assistance.  Clear mood benefit with Spravato with much less dep and wants to continue. she [facility] to [facility] and had her brain scanned at [facility]. they recommend she taper off Effexor which she is only taking 150 mg currently and start Auvelity. She reduce Effexor on her own to 150 mg daily. She wants further improvement in anhedonia.    04/28/23 received Spravato 84  05/11/23 appt noted: Psych meds: Clonazepam 1 to 1.5 mg nightly, lithium CR 300 nightly,  venlafaxine XR to 150 mg every morning, Auvelity BID, tried various stimulants lately, gabapentin 900 mg HS, dexmethylphenidate ER without help No side effects noted at the current dosage. Patient was administered Spravato 84 mg intranasally today.  The patient experienced the typical dissociation which gradually resolved over the 2-hour period of observation.  There were no complications.  Specifically the patient did not have nausea or vomiting.  Able to leave at end of 2 hour period without assistance.  Clear mood benefit with Spravato with much less dep and wants to continue. Overall her mood is still significantly better with the combination of Spravato venlafaxine and Auvelity in particular.  She has not had any problems from the reduction in venlafaxine but is not dramatically improved with regard to residual depression since adding Auvelity but she is not having side effects.  We agreed that a longer trial of the helpful.  She is also wondering about increasing the Spravato frequency from once every other week to once every 10 days in order to prove improved residual depression and specifically anhedonia.  Overall though she is dramatically better with the current combination of meds. Plan: continue venlafaxine ER 150 with Auvelity BID bc  TRD  05/25/23 appt noted: Psych meds: Clonazepam 1 to 1.5 mg nightly, lithium CR 300 nightly,  venlafaxine XR to 150 mg every morning, Auvelity BID, tried various stimulants lately, gabapentin 900 mg HS, dexmethylphenidate ER without help No side effects noted at the current dosage. Patient was administered Spravato 84 mg intranasally today.  The patient experienced the typical dissociation which gradually resolved over the 2-hour period of observation.  There were no complications.  Specifically the patient did not have nausea or vomiting.  Able to leave at end of 2 hour period without assistance.  Clear mood benefit with Spravato with much less dep and wants to continue.   AIMS    Flowsheet Row Video Visit from 01/03/2022 in [facility]  AIMS Total Score 0      ECT-MADRS    Flowsheet Row Clinical Support from 08/13/2022 in [facility] Video Visit from 04/29/2022 in [facility] Video Visit from 02/06/2022 in [facility]  MADRS Total Score 27 44 23      GAD-7    Flowsheet Row Counselor from 12/19/2022 in [facility] Office Visit from 08/22/2022 in [facility] [place]  Total GAD-7 Score 4 7      PHQ2-9    Flowsheet Row Counselor from 12/19/2022 in [facility] Office Visit from 08/22/2022 in [facility]  Visit from 08/19/2022 in [facility] for [facility] Video Visit from 02/06/2022 in [facility] Office Visit from 11/16/2020 in [facility] for [facility] at [facility]  PHQ-2 Total Score 2 3 0 6 0  PHQ-9 Total Score 5 5 -- 14 --        Past Psychiatric Medication Trials: Trintellix - Initially effective and then was not effective when re-started Paxil- Ineffective.  Helped initially. Prozac-Insomnia Lexapro- Effective and then no longer as effective Zoloft- Initially effective and then minimal response Viibryd Cymbalta- Ineffective. Helped initially. Effexor XR 300- Effective, well tolerated. Pristiq- Increased anxiety at 150 mg daily Wellbutrin XL-Ineffective.May have increased anxiety. Amitriptyline Nortriptyline-Caused irritability, constipation Auvelity- ineffective Selegilne brief 20 mg daily , more irritable  Abilify- Helpful but caused severe insomnia. Rexulti-Helpful but caused insomnia. Vraylar Seroquel - Ineffective Risperdal Olanzapine- Ineffective Latuda- Ineffective Caplyta  Klonopin- Effective Temazepam- Took during menopause Lunesta- Ineffective Ambien-Ineffective Sonata- Ineffective Dayvigo- partially effective  Belsomra 20 worked and then failed  Trazodone-  somewhat effective Remeron-Ineffective. Does not recall wt gain.  Quetiapine 25 NR  Adderall- Took once and had adverse effect. Concerta- Effective but only 4-6 hours Jornay 80 NR Metadate-Not as effective as Concerta Dexmethylphenidate IR NR Dexmethylphenidate ER 10/02/21- Not as effective as Concerta Azstarys- some side effects. Not as effective.  Ritalin short acting and SE anxiety & HTN @20  TID Cotempla 17.3  Daytrana NR  Lamictal- May have had an adverse effects. "Felt weird." Reports worsening s/s.  Lithium ? effect Gabapentin Pramipexole- 1 mg QID NR  ECT #20 with minimal response. H says it was partly helpful.  AIMS    Flowsheet Row Video Visit from 01/03/2022 in [facility]  AIMS Total Score 0      ECT-MADRS    Flowsheet Row Clinical Support from 08/13/2022 in [facility] Video Visit from 04/29/2022 in [facility] Video Visit from 02/06/2022 in [facility]  MADRS Total Score 27 44 23      GAD-7    Flowsheet Row Counselor  from 12/19/2022 in [facility] Office Visit from 08/22/2022 in [facility] [place]  Total GAD-7 Score 4 7      PHQ2-9    Flowsheet Row Counselor from 12/19/2022 in [facility] Office Visit from 08/22/2022 in [facility] [place] Office Visit from 08/19/2022 in [facility] for [facility] at [facility] Video Visit from 02/06/2022 in [facility] Office Visit from 11/16/2020 in [facility] at [facility] Total Score 2 3 0 6 0  PHQ-9 Total Score 5 5 -- 14 --        Review of Systems:  Review of Systems  Constitutional:  Positive for fatigue.  Cardiovascular:  Negative for palpitations.  Neurological:  Negative for dizziness, tremors and headaches.  Psychiatric/Behavioral:  Positive for decreased concentration, dysphoric mood and sleep disturbance. Negative for agitation, confusion, hallucinations and suicidal ideas. The patient is nervous/anxious.     Medications: I have reviewed the patient's current medications.  Current Outpatient Medications  Medication Sig Dispense Refill   lisdexamfetamine (VYVANSE) 40 MG capsule Take 1 capsule (40 mg total) by mouth every morning. 30 capsule 0   clonazePAM (KLONOPIN) 1 MG tablet Take 1-2 tablets (1-2 mg total) by mouth at bedtime. 60 tablet 0  Dextroamphetamine (XELSTRYM) 13.5 MG/9HR PTCH Place 1 patch onto the skin every morning. (Patient not taking: Reported on 05/25/2023) 30 patch 0   Dextromethorphan-buPROPion ER (AUVELITY) 45-105 MG TBCR Take 1 tablet by mouth 2 (two) times daily. 60 tablet 0   Esketamine HCl, 84 MG Dose, (SPRAVATO, 84 MG DOSE,) 28 MG/DEVICE SOPK Place 84 mg into the nose once a week. 3 each 3   Estradiol (VAGIFEM) 10 MCG TABS vaginal tablet Place 1 tablet (10 mcg total) vaginally 2 (two) times a week. 24 tablet 3   gabapentin  (NEURONTIN) 300 MG capsule 2 at bedtime and 1-2 capsule if need for early morning awakening. 120 capsule 1   lithium carbonate (LITHOBID) 300 MG ER tablet Take 2 tablets (600 mg total) by mouth at bedtime. 180 tablet 0   traZODone (DESYREL) 50 MG tablet TAKE 1 TABLET BY MOUTH AT BEDTIME 30 tablet 0   valACYclovir (VALTREX) 500 MG tablet Take one twice daily for 3 days with any symptoms 30 tablet 3   valsartan (DIOVAN) 80 MG tablet Take 1 tablet (80 mg total) by mouth daily. 90 tablet 3   venlafaxine XR (EFFEXOR-XR) 150 MG 24 hr capsule Take 150 mg by mouth daily with breakfast.     No current facility-administered medications for this visit.    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   TRANSAMINASES, SERUM, ELEVATED 08/14/2009    Past Medical History, Surgical history, Social history, and Family history were reviewed and updated as appropriate.   Please see review of systems for further details on the patient's review from today.   Objective:   Physical Exam:  LMP 12/27/2007 (Exact Date)   Physical Exam Constitutional:      General: She is not in acute distress. Musculoskeletal:        General: No deformity.  Neurological:     Mental Status: She is alert and oriented to person, place, and time.     Coordination: Coordination normal.  Psychiatric:        Attention and Perception: Perception normal. She is inattentive. She does not perceive auditory hallucinations.        Mood and Affect: Mood is anxious and depressed. Affect is blunt. Affect is not tearful.        Speech: Speech normal. Speech is not slurred.        Behavior:  Behavior normal.        Thought Content: Thought content normal. Thought content is not delusional. Thought content does not include homicidal or suicidal ideation. Thought content does not include suicidal plan.        Cognition and Memory: Cognition and memory normal.        Judgment: Judgment normal.     Comments: Insight intact 4/10 dep but better with Spravato markedly. Affect blunted but with a little more expression      Lab Review:     Component Value Date/Time   NA 132 (L) 10/27/2022 1107   K 4.4 10/27/2022 1107   CL 95 (L) 10/27/2022 1107   CO2 30 10/27/2022 1107  GLUCOSE 89 10/27/2022 1107   BUN 14 10/27/2022 1107   CREATININE 0.67 10/27/2022 1107   CREATININE 0.68 01/26/2020 0920   CALCIUM 10.1 10/27/2022 1107   PROT 8.3 04/22/2022 0932   ALBUMIN 5.3 (H) 04/22/2022 0932   AST 82 (H) 04/22/2022 0932   ALT 118 (H) 04/22/2022 0932   ALKPHOS 74 04/22/2022 0932   BILITOT 0.3 04/22/2022 0932   GFRNONAA >90 05/01/2014 1645   GFRAA >90 05/01/2014 1645       Component Value Date/Time   WBC 3.3 (L) 04/22/2022 0932   RBC 4.25 04/22/2022 0932   HGB 14.3 04/22/2022 0932   HGB 14.1 10/26/2014 1034   HCT 40.7 04/22/2022 0932   PLT 245.0 04/22/2022 0932   MCV 95.7 04/22/2022 0932   MCH 34.4 (H) 01/26/2020 0920   MCHC 35.1 04/22/2022 0932   RDW 13.2 04/22/2022 0932   LYMPHSABS 0.9 04/22/2022 0932   MONOABS 0.2 04/22/2022 0932   EOSABS 0.1 04/22/2022 0932   BASOSABS 0.0 04/22/2022 0932    No results found for: "POCLITH", "LITHIUM"   No results found for: "PHENYTOIN", "PHENOBARB", "VALPROATE", "CBMZ"   .res Assessment: Plan:    Heather Davila was seen today for follow-up, depression, anxiety, add, sleeping problem and medication reaction.  Diagnoses and all orders for this visit:  Recurrent major depression resistant to treatment (HCC)  Generalized anxiety disorder  Attention deficit hyperactivity disorder (ADHD), predominantly inattentive type -      lisdexamfetamine (VYVANSE) 40 MG capsule; Take 1 capsule (40 mg total) by mouth every morning.  Insomnia, unspecified type   Pt seen for TRD with multiple failed medications as noted above.    Mood was reportedly better with venlafaxine XR 300 with Spravato but ongoing dep with anhedonia and fatigue.  Then she went to the [facility] and they rec Auvelity and she reduced venlafaxine on her own to 150 mg  and added Auvelity BID.  Mood is still better.   She has continued venlafaxine ER 150 mg daily and Auyvelity BID and agrees to max benefit, we will continue this dose for longer period.  There is an ongoing pattern of her making impulsive decisions on her own about meds which are counter therapeutic and not informing MD of what is going on until after th fact.  That is not a helpful pattern and she is encouraged to stop it.  Patient was administered Spravato 84 mg intranasally today.  The patient experienced the typical dissociation which gradually resolved over the 2-hour period of observation.  There were no complications.  Specifically the patient did not have nausea or vomiting or headache.  Blood pressures remained within normal ranges at the 40-minute and 2-hour follow-up intervals.  By the time the 2-hour observation period was met the patient was alert and oriented and able to exit without assistance.  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  See nursing note for further details. Would like to increase frequency from every other week to every 10 days looking for further improvement in depression.  Consider switch Spravato to MAOI, Parnate  We discussed the short-term risks associated with benzodiazepines including sedation and increased fall risk among others.  Discussed long-term side effect risk including dependence, potential withdrawal symptoms, and the potential eventual dose-related risk of  dementia.  But recent studies from 2020 dispute this association between benzodiazepines and dementia risk. Newer studies in 2020 do not support an association with dementia.  for insomnia, wants to continue clonazepam 2 mg nightly. Can't sleep without it.   Reports needing gabapentin [phone] mg HS to help sleep.  Cotempla or Concerta bc helps mood early in day and focus  benefit lasts longer.  Have not been able to get stimulant to be consitently effective throughout the day. Conisder switching to patch bc pt has significant crash with stimulants or perhaps [place].  Stimulant tablets cause crash with inadequate duration .  Patches should provide smoother response.  Will try another patch, Noemi Chapel if we can get it approved.   Insurance denied PA for this medication stating she has to try dextroamphetamine ER, Adderall XR, and Vyvanse before will be considered. Have been reluctant to try Adderall XR because she did not tolerate Adderall at home.     Discussed potential benefits, risks, and side effects of stimulants with patient to include increased heart rate, palpitations, insomnia, increased anxiety, increased irritability, or decreased appetite.  Instructed patient to contact office if experiencing any significant tolerability issues.  Dont' decrease venlafaxine further from 150 mg at this time.  Disc SE in detail and SSRI withdrawal sx.  Continue additional Auvelity trial BID per rec of [facility].  Documents reviewed from them. She's tolerating combination and mood over all has been ok with beneifit from meds and Spravato. Her last trial with Auvelity was fairly brief but she said it was not helpful.  It may be more helpful in combination with an SNRI which is appropriate given her treatment resistant depression diagnosis.  She is tolerating it well and full agreement.  To max opportunity to respond for tRD, lithium 300 mg PM.  Disc pretreating Spravato with NSAID or Tylenol bc of HA  lately.  trazodone  50 mg HS  Supportive therapy dealing with serious stressful life event.  Plan: continue Clonazepam 1 to 1.5 mg nightly, lithium CR 300 nightly,  venlafaxine XR to 150 mg every morning, Auvelity BID, tried various stimulants lately, gabapentin 900 mg HS,  Plan trial Vyvanse 40 AM bc can't get patch  FU every 7-10 days  Meredith Staggers, MD, DFAPA    Please see After Visit Summary for patient specific instructions.  Future Appointments  Date Time Provider Department Center  06/04/2023  9:00 AM Cottle, Steva Ready., MD [facility] None  06/04/2023  9:00 AM CP-NURSE [facility] None  09/18/2023  8:15 AM Jerene Bears, MD [facility]    No orders of the defined types were placed in this encounter.   -------------------------------

## 2023-05-25 NOTE — Progress Notes (Signed)
NURSES NOTE:           Patient arrived for her #65 Spravato treatment. Pt is coming every other week now. Pt is being treated for Treatment Resistant Depression, pt will be receiving 84 mg (3 of the 28 mg) nasal sprays, this is her maintenance dose. Patient taken to treatment room, I explained and discussed her treatment and how the schedule should go, along with any side effects that may occur. Answered any questions and concerns the patient had. Pt's Spravato is delivered through [place] and she is now billing with her insurance. Spravato medication is stored at doctors office per REMS/FDA guidelines. The medication is required to be locked behind two doors per FDA/REMS Protocol. Medication is also disposed of properly per regulations. All treatments and her vital signs are documented in Spravato REMS per protocol of treatment center regulations. [facility] delivers her medication.      Vital Signs assessed at 8:02 AM 124/81, pulse 71 pulse ox 92%. Instructed pt to blow her nose and recline back slightly to prevent any of medication dripping out of her nose.   Pt. Given 1st dose (28 mg inhaler), then 5 minutes between 2nd dose and 3rd dose for total of 84 mg. No complaints of nausea/vomiting reported. Pt did listen to music today, she just reclined back and closed her eyes. After 40 minutes I went to assess her vital signs, no side effects or symptoms reported, B/P 118/72, pulse 63.  Dr. Jennelle Human talked to pt today about her treatment. Nurse was with pt a total of 60 minutes but pt observed for 120 minutes per protocol. Discharge vital signs were at 10:00 AM, B/P 114/77, pulse 71. She verbalized understanding. She reports dissociation but she was clear upon her discharge. She is instructed to contact office if needing anything prior to her next treatment.  She is scheduled on February 6th.   LOT # [id number]   EXP MAY 2027

## 2023-05-26 ENCOUNTER — Telehealth: Payer: Self-pay

## 2023-05-26 NOTE — Telephone Encounter (Signed)
Pt reports she has been feeling more down and depressed the last 3-4 days along with having hand tremors. She reports it started a little after she started on the [facility] and she feels the tremors are getting worse.

## 2023-05-26 NOTE — Telephone Encounter (Signed)
Auvelity will not make her more depressed.  It might worsen tremor.  Don't stop it.  But she can reduce to 1 in the evening.  That should reduce the tremor if related.

## 2023-05-27 NOTE — Telephone Encounter (Signed)
Pt is notified to decrease dose and she did agree things started when she increased to two daily. Her next treatment is next week February 6th.

## 2023-05-29 ENCOUNTER — Ambulatory Visit: Payer: 59 | Admitting: Professional Counselor

## 2023-06-04 ENCOUNTER — Ambulatory Visit (INDEPENDENT_AMBULATORY_CARE_PROVIDER_SITE_OTHER): Payer: 59 | Admitting: Psychiatry

## 2023-06-04 ENCOUNTER — Encounter: Payer: Self-pay | Admitting: Psychiatry

## 2023-06-04 ENCOUNTER — Ambulatory Visit: Payer: 59

## 2023-06-04 VITALS — BP 114/74 | HR 62

## 2023-06-04 DIAGNOSIS — G4700 Insomnia, unspecified: Secondary | ICD-10-CM | POA: Diagnosis not present

## 2023-06-04 DIAGNOSIS — F339 Major depressive disorder, recurrent, unspecified: Secondary | ICD-10-CM | POA: Diagnosis not present

## 2023-06-04 DIAGNOSIS — F411 Generalized anxiety disorder: Secondary | ICD-10-CM

## 2023-06-04 DIAGNOSIS — F900 Attention-deficit hyperactivity disorder, predominantly inattentive type: Secondary | ICD-10-CM

## 2023-06-04 MED ORDER — VENLAFAXINE HCL ER 75 MG PO CP24: 225.0000 mg | ORAL_CAPSULE | Freq: Every day | ORAL | 1 refills | Status: DC

## 2023-06-04 NOTE — Progress Notes (Addendum)
 Heather Davila [id number] 03/01/1961 63 y.o.  Subjective:   Patient ID:  Heather Davila is a 63 y.o. (DOB 1960/10/24) female.  Chief Complaint:  Chief Complaint  Patient presents with   Follow-up   Depression   Anxiety   Stress   ADD    HPI Heather Davila presents to the office today for follow-up of treatment resistant recurrent depression, generalized anxiety disorder and insomnia.  Almost too numerous to count med failures. She is here for Spravato  administration for her treatment resistant depression.  05/21/22 appt noted: Patient was administered first dose Spravato  56 mg intranasally today.  The patient experienced the typical dissociation which gradually resolved over the 2-hour period of observation.  There were no complications.  Specifically the patient did not have nausea or vomiting or headache.   Dissociation was mild. Did not experience any emotional relief from disabling depression.wants to increase Spravato  to the usual dose.  She is aware insurance is not covering the costs at this time.  No SI acutely. Tolerating meds.    05/23/22 appt noted: Patient was administered Spravato  84 mg intranasally today.  The patient experienced the typical dissociation which gradually resolved over the 2-hour period of observation.  There were no complications.  Specifically the patient did not have nausea or vomiting.   Dissociation was greater than last dose and a little bothersome DT some HA. Tolerating meds.   Mood has not changed significantly thus far with Spravato .  05/27/22 appt noted: Patient was administered Spravato  84 mg intranasally today.  The patient experienced the typical dissociation which gradually resolved over the 2-hour period of observation.  There were no complications.  Specifically the patient did not have nausea or vomiting or headache. Today she has felt a little relief from the pressing heaviness of depression but a little more anxiety.  During administration  of Spravato  she listens to Christian music and was able to relax a little more with the feeling of dissociation that was mildly bothersome. Husband was also in on the session today and asked some questions about IV ketamine versus nasal spray ketamine.  05/29/22 appt noted: Cancelled today DT hypertension  05/30/22 appt noted: Patient was administered Spravato  84 mg intranasally today.  The patient experienced the typical dissociation which gradually resolved over the 2-hour period of observation.  There were no complications.  Specifically the patient did not have nausea or vomiting or headache. She is seeing some improvement in mood with less continuous depression and less intensity.  Hopefulness is better.   No med complaints or SE  06/05/22 appt noted: Patient was administered Spravato  84 mg intranasally today.  The patient experienced the typical dissociation which gradually resolved over the 2-hour period of observation.  There were no complications.  Specifically the patient did not have nausea or vomiting or headache.  Specifically HA is less of a problem with Spravato . No SE with meds. Depression is improving with less intensity.  Intermittent anxiety easily.  More able to enjoy things.  No new concerns.  06/17/22 appt noted: Patient was administered Spravato  84 mg intranasally today.  The patient experienced the typical dissociation which gradually resolved over the 2-hour period of observation.  There were no complications.  Specifically the patient did not have nausea or vomiting or headache.  Specifically HA is less of a problem with Spravato . No SE with meds. Depression is improved 45% with less intensity.  Intermittent anxiety less easily.  More able to enjoy things.  No new concerns.  More active.  Current meds: increased clonazepam  to about 1.5 mg HS DT recent insomnia, lexapro  20, Dayvigo  10 mg HS, concerta  36 mg AM, trazodone  100 mg HS.  06/20/22 appt noted: Current meds: increased  clonazepam  to about 1.5 mg HS DT recent insomnia, lexapro  20, Dayvigo  10 mg HS, concerta  36 mg AM, trazodone  100 mg HS. Patient was administered Spravato  84 mg intranasally today.  The patient experienced the typical dissociation which gradually resolved over the 2-hour period of observation.  There were no complications.  Specifically the patient did not have nausea or vomiting or headache.  Specifically HA is less of a problem with Spravato . No SE with meds. Depression is improved 45% with less intensity.  Intermittent anxiety less easily.  More able to enjoy things.  No new concerns.  More active.  06/23/22 appt noted:  Current meds: increased clonazepam  to about 1.5 mg HS DT recent insomnia, lexapro  20, Dayvigo  10 mg HS, concerta  36 mg AM, trazodone  100 mg HS. Patient was administered Spravato  84 mg intranasally today.  The patient experienced the typical dissociation which gradually resolved over the 2-hour period of observation.  There were no complications.  Specifically the patient did not have nausea or vomiting or headache.  Specifically HA is less of a problem with Spravato . No SE with meds. Intensity of Spravato  dissociation varies.  Last week very intesne and this week more typical.  Depression is continuing to improve with better interest and activity and motivation.  Gradual improvement.  Wants to continue.  06/25/22 appt noted: Current meds: increased clonazepam  to about 1.5 mg HS DT recent insomnia, lexapro  20, Dayvigo  10 mg HS, concerta  36 mg AM, trazodone  100 mg HS. Patient was administered Spravato  84 mg intranasally today.  The patient experienced the typical dissociation which gradually resolved over the 2-hour period of observation.  There were no complications.  Specifically the patient did not have nausea or vomiting or headache.  Specifically HA is less of a problem with Spravato . No SE with meds. Intensity of Spravato  dissociation varies.  No scary and well  tolerated. Continues to gradually improve with Spravato .  She is more motivated and enjoying things more.  H sees progress.  Wants to continue twice weekly until gets maximum response.  Dep is not gone yet.  06/30/22 appt noted:  seen with H Current meds: increased clonazepam  to about 1.5 mg HS DT recent insomnia, lexapro  20, Dayvigo  10 mg HS, concerta  36 mg AM, trazodone  100 mg HS. Patient was administered Spravato  84 mg intranasally today.  The patient experienced the typical dissociation which gradually resolved over the 2-hour period of observation.  There were no complications.  Specifically the patient did not have nausea or vomiting or headache.  Specifically HA is less of a problem with Spravato . No SE with meds. Intensity of Spravato  dissociation varies.  No scary and well tolerated. Continues to gradually improve with Spravato .  She is more motivated and enjoying things more.  H sees even more progress than she does; more active and smiling.  Wants to continue twice weekly until gets maximum response.  Dep is 80% better.  07/07/22 appt noted: Current meds: increased clonazepam  to about 1.5 mg HS DT recent insomnia, lexapro  20, Dayvigo  10 mg HS, concerta  36 mg AM, trazodone  100 mg HS. Patient was administered Spravato  84 mg intranasally today.  The patient experienced the typical dissociation which gradually resolved over the 2-hour period of observation.  There were no complications.  Specifically the patient did not have nausea  or vomiting or headache.  Specifically HA is less of a problem with Spravato . No SE with meds. Intensity of Spravato  dissociation varies.  No scary and well tolerated.  Was more intese today. Overall mood is markedly better and please with meds. No changes desired.  07/09/22 appt noted: In transition from Lexapro  to sertraline  and missing Concerta  bc CO crash from it after about 4 hours.  Disc these concerns.  She'll discuss also with Harlene Pepper, NP More dep the  last couple of days and concerned about reducing Spravato  frequency while transition from one antidep to another.  Affect less positive. Patient was administered Spravato  84 mg intranasally today.  The patient experienced the typical dissociation which gradually resolved over the 2-hour period of observation.  There were no complications.  Specifically the patient did not have nausea or vomiting or headache.  Specifically HA is less of a problem with Spravato . No SE with meds. Intensity of Spravato  dissociation varies.  No scary and well tolerated.   07/14/22 appt noted: Has been more depressed this week.  More trouble with sleep.  Taking clonazepam  1-1.5  of the 0.5 mg tablets.   Trazodone  not helping much. She switched back from 200 sertraline  to Lexapro  20.  No SE More trouble with motivation.  Some stress with son with special needs. Misses the benevit of Concerta  but it was too short acting and crashed in 4-6 hours. Patient was administered Spravato  84 mg intranasally today.  The patient experienced the typical dissociation which gradually resolved over the 2-hour period of observation.  There were no complications.  Specifically the patient did not have nausea or vomiting or headache.  Specifically HA is less of a problem with Spravato . No SE with meds. Intensity of Spravato  dissociation varies.  No scary and well tolerated.   07/17/22 appt noted: Patient was administered Spravato  84 mg intranasally today.  The patient experienced the typical dissociation which gradually resolved over the 2-hour period of observation.  There were no complications.  Specifically the patient did not have nausea or vomiting or headache.  Specifically HA is less of a problem with Spravato . No SE with meds. Intensity of Spravato  dissociation varies.  Not scary and well tolerated.  Just got Jornay last night but wasn't sure how to take it.  Wants to start and this was discussed.  She felt MPH helped mood and attn but  didn't last long enough and had crash after a few hours on Concerta .  Better for 2 hours after each dose Ritalin  20 mg with mood but then it wore off.  BP up after 3rd dose 160/90 and then took H's amlodipine 5 and BP became normal.  07/22/22 appt noted: Patient was administered Spravato  84 mg intranasally today.  The patient experienced the typical dissociation which gradually resolved over the 2-hour period of observation.  There were no complications.  Specifically the patient did not have nausea or vomiting or headache.  Specifically HA is less of a problem with Spravato . No SE with meds. Intensity of Spravato  dissociation varies.  Not scary and well tolerated.  Start Jornay 20 mg over the weekend and did not notice any particular effect good or bad.  Increased to 40 mg. Other psych meds: Clonazepam  0.5 mg tablets 2 nightly, gabapentin  dosage varies, Dayvigo  10 mg nightly, Lexapro  20 mg daily, to trazodone  100 mg nightly  07/24/22 appt noted: Patient was administered Spravato  84 mg intranasally today.  The patient experienced the typical dissociation which gradually resolved over  the 2-hour period of observation.  There were no complications.  Specifically the patient did not have nausea or vomiting or headache.  Specifically HA is less of a problem with Spravato . No SE with meds. Intensity of Spravato  dissociation varies.  Not scary and well tolerated.  Other psych meds: Clonazepam  0.5 mg tablets 2 nightly, gabapentin  dosage varies, Dayvigo  10 mg nightly, Lexapro  20 mg daily, to trazodone  100 mg nightly, Jornay 40 mg PM Doing well with Spravato .  Noticed a little effect from the Jornay 40 without SE but would like to increase the dose for ADD and mood.  Sleeping well.  No new concerns.  Still more depressed than she was with th initial benefit of Spravato .  07/30/22 note:  Patient was administered Spravato  84 mg intranasally today.  The patient experienced the typical dissociation which gradually  resolved over the 2-hour period of observation.  There were no complications.  Specifically the patient did not have nausea or vomiting or headache.  Specifically HA is less of a problem with Spravato . Other psych meds: Clonazepam  0.5 mg tablets 2 nightly, gabapentin  dosage varies, Dayvigo  10 mg nightly, Lexapro  20 mg daily, to trazodone  100 mg nightly, Jornay 60 mg PM Wants to increase Jornay to 80 mg PM to increase benefit for ADD and depression. No SE She has experienced a major family crisis that threatens to change her daily life from now on.  It has not triggered suicidal thoughts at this time but she is very afraid.  No desire to change medications.  08/04/22 appt noted Patient was administered Spravato  84 mg intranasally today.  The patient experienced the typical dissociation which gradually resolved over the 2-hour period of observation.  There were no complications.  Specifically the patient did not have nausea or vomiting or headache.  Specifically HA is less of a problem with Spravato . Other psych meds: Clonazepam  0.5 mg tablets 2 nightly, gabapentin  dosage varies, Dayvigo  10 mg nightly, Lexapro  20 mg daily, trazodone  100 mg nightly, Jornay 80 mg PM No SE No benefit or SE with increase Jormay 80 mg.  Says Concerta  helped ADD and depression but not Jornay.  However duration was insufficient.  Still depressed and wants change to another form of stimulant.  No concerns with other meds. Continues with strong family stressors creating uncertainty about her future but no quick resolution. No SI Trial Cotempla  17.3 and DC Ritalin  & Jornay 80  08/11/2022 appointment noted: Patient was administered Spravato  84 mg intranasally today.  The patient experienced the typical dissociation which gradually resolved over the 2-hour period of observation.  There were no complications.  Specifically the patient did not have nausea or vomiting or headache.  Specifically HA is less of a problem with Spravato  now  vs when started it.. Other psych meds: Clonazepam  0.5 mg tablets 2 nightly, gabapentin  dosage varies, Dayvigo  10 mg nightly, trazodone  100 mg nightly, cotempla  17.3, switch from Lexapro  20 to sertaline 15o mg . No noticeable effects sertraline  from for depression but has seen some antianxiety effects from the sertraline .  No side effects noted.  No side effects with the other medicines.  Still is only getting very brief benefit 3 to 4 hours from Cotempla  for ADD and mood.  She wants to try the Daytrana  patch as we have discussed before. No SE  08/13/22 appt: Patient was administered Spravato  84 mg intranasally today.  The patient experienced the typical dissociation which gradually resolved over the 2-hour period of observation.  There were no complications.  Specifically the patient did not have nausea or vomiting or headache.  Specifically HA is less of a problem with Spravato  now vs when started it.. Other psych meds: Clonazepam  0.5 mg tablets 2 nightly, gabapentin  dosage varies, Dayvigo  10 mg nightly, trazodone  100 mg nightly, cotempla  17.3, switch from Lexapro  20 to sertaline 15o mg . No noticeable effects sertraline  from for depression but has seen some antianxiety effects from the sertraline .  No side effects noted.  No side effects with the other medicines.  Still is only getting very brief benefit 3 to 4 hours from Cotempla  for ADD and mood.  She wants to try the Daytrana  patch as we have discussed before.  Hasn't been able to get it yet. No SE Plan.  Continue to try to get Daytrana  30 AM  08/18/22 appt noted: Patient was administered Spravato  84 mg intranasally today.  The patient experienced the typical dissociation which gradually resolved over the 2-hour period of observation.  There were no complications.  Specifically the patient did not have nausea or vomiting or headache.  Specifically HA is less of a problem with Spravato  now vs when started it.. Other psych meds: Clonazepam  0.5 mg  tablets 2 nightly, gabapentin  dosage varies, Dayvigo  10 mg nightly, trazodone  100 mg nightly, cotempla  17.3, switch from Lexapro  20 to sertaline 15o mg . No noticeable effects sertraline  from for depression but has seen some antianxiety effects from the sertraline .  No side effects noted.  No side effects with the other medicines.  Still is only getting very brief benefit 3 to 4 hours from Cotempla  for ADD and mood.  She wants to try the Daytrana  patch as we have discussed before.  Hasn't been able to get it yet.  Overall depression is some better than it was.   No SE Plan.  Continue to try to get Daytrana  30 AM  08/25/22 appt : No benefit Daytrana .  No SE.  Wants further med changes and interested In retrying pramipexole .   Tolerating meds. Patient was administered Spravato  84 mg intranasally today.  The patient experienced the typical dissociation which gradually resolved over the 2-hour period of observation.  There were no complications.  Specifically the patient did not have nausea or vomiting or headache.  Specifically HA is less of a problem with Spravato  now vs when started it.. Other psych meds: Clonazepam  0.5 mg tablets 2 nightly, gabapentin  dosage varies, Dayvigo  10 mg nightly, trazodone  100 mg nightly, , switch from Lexapro  20 to sertaline 15o mg . Daytrana  30 AM for a few days. Struggles with depression and no SI.  Reduced interest and mood and motivation and enjoyment and socialization.  08/27/22 appt noted: Psych meds:  Clonazepam  0.5 mg tablets 2 nightly, gabapentin  dosage varies, Dayvigo  10 mg nightly, trazodone  100 mg nightly,  sertaline 15o mg . Returned to Cotempla  AM, pramipexole   Tolerating meds. Patient was administered Spravato  84 mg intranasally today.  The patient experienced the typical dissociation which gradually resolved over the 2-hour period of observation.  There were no complications.  Specifically the patient did not have nausea or vomiting or headache.   Reports  taking cotempla  bc brief mood benefit and sig focus and productivity benefit. Took 1 dose pramipexole  and felt more dep.  09/01/22 appt noted: Psych meds:  Clonazepam  0.5 mg tablets 2 nightly, gabapentin  dosage varies, Dayvigo  10 mg nightly, trazodone  100 mg nightly,  sertaline 15o mg . Returned to Cotempla  AM, pramipexole   0.25 mg BID. Tolerating meds now. Patient was administered  Spravato  84 mg intranasally today.  The patient experienced the typical dissociation which gradually resolved over the 2-hour period of observation.  There were no complications.  Specifically the patient did not have nausea or vomiting or headache.   Willing to try the pramipexole  and will continue 0.25 mg BID this week and then consider increase if tolerated.  Mood is a little better.  Still looking for further improvement.  09/03/22 appt noted: Psych meds:  Clonazepam  0.5 mg tablets 2 nightly, gabapentin  dosage varies, Dayvigo  10 mg nightly, trazodone  100 mg nightly,  sertaline 15o mg . Returned to Cotempla  AM, pramipexole   0.25 mg tablet 1 and 1/2 tablets twice daily BID. Tolerating meds now. Patient was administered Spravato  84 mg intranasally today.  The patient experienced the typical dissociation which gradually resolved over the 2-hour period of observation.  There were no complications.  Specifically the patient did not have nausea or vomiting or headache.   Depression may be a little better with the parmipexole and is tolerating it well so far.  Still struggling with focus and residual depression.  Tolerating meds without SE.  More hopeful and able to enjoy some things.  09/08/22 appt  Psych meds:  Clonazepam  0.5 mg tablets 2 nightly, gabapentin  dosage varies, Dayvigo  10 mg nightly, trazodone  100 mg nightly,  sertaline 15o mg . Returned to Cotempla  AM, pramipexole   0.5 mg BID. Tolerating meds now. Patient was administered Spravato  84 mg intranasally today.  The patient experienced the typical dissociation which  gradually resolved over the 2-hour period of observation.  There were no complications.  Specifically the patient did not have nausea or vomiting or headache.   She has seen her depression drop from a 10/10 prior to treatment with Spravato  to now 4/10 With additional benefit noted when she increased the pramipexole  to 0.5 mg twice daily.  She is not having side effects with this like she did in the past. Anxiety levels are also improved. She is sleeping okay with the current medications. Plan: continue pramipexole  trial off-label and increase more slowly for TRD.  Increase  Gradually  to 0.75 mg BID  09/11/22 appt noted: Psych meds:  Clonazepam  0.5 mg tablets 2 nightly, gabapentin  dosage varies, Dayvigo  10 mg nightly, trazodone  100 mg nightly,  sertaline 15o mg . Returned to Cotempla  AM, pramipexole   0.75 mg BID. Tolerating meds now. Patient was administered Spravato  84 mg intranasally today.  The patient experienced the typical dissociation which gradually resolved over the 2-hour period of observation.  There were no complications.  Specifically the patient did not have nausea or vomiting or headache.   She has seen her depression drop from a 10/10 prior to treatment with Spravato  to now 4/10 With additional benefit noted when she increased the pramipexole  to 0.5 mg twice daily.  She is not having side effects with this like she did in the past. Clearly improving with the pramipexole  now and tolerating it.  Satisfied with current meds but would consider increasing if it might be helpful.  09/15/22 appt noted: Psych meds:  Clonazepam  0.5 mg tablets 2 nightly, gabapentin  dosage varies, Dayvigo  10 mg nightly, trazodone  100 mg nightly,  sertaline 15o mg . Returned to Cotempla  AM, pramipexole   0.75 mg BID too anxious and reduced to 0.5 mg BID. Tolerating meds now. Patient was administered Spravato  84 mg intranasally today.  The patient experienced the typical dissociation which gradually resolved over  the 2-hour period of observation.  There were no complications.  Specifically the patient  did not have nausea or vomiting or headache.   Trouble staying asleep longer than 6 hours.  Doesn't feel she can tolerate pramipexole  0.75 mg BID DT anxiety not experienced at  0.5 mg BID.  Otherwise tolerating meds.  Mood is better with Spravato  and pramipexole .   Not sleeping as long with meds.  EMA 6 hours.  Wonders about change Disc concerns about waiting for therapy. Feels ready to reduce Spravato  to weekly.    09/25/22 appt noted: Psych meds:  Clonazepam  0.5 mg tablets 2 nightly, gabapentin  dosage varies, Dayvigo  10 mg nightly, trazodone  100 mg nightly,  sertaline 15o mg . Returned to Cotempla  AM, pramipexole   0.75 mg BID too anxious and reduced to 0.5 mg BID. Tolerating meds now. Patient was administered Spravato  84 mg intranasally today.  The patient experienced the typical dissociation which gradually resolved over the 2-hour period of observation.  There were no complications.  Specifically the patient did not have nausea or vomiting or headache.   Trouble staying asleep longer than 6 hours.  Failed to respond to Seroquel  25 mg HS.  Gone back to Dayvigo .  Belsomra  NR. Mood ok until the last few days more dep bc it's been 10 days since last admin.  Wants to continue weekly.   No SE.  No med changes desired.  09/29/22 appt noted: Psych meds:  Clonazepam  0.5 mg tablets 2 nightly, gabapentin  dosage varies, Dayvigo  10 mg nightly, trazodone  100 mg nightly,  sertaline 15o mg . Returned to Cotempla  AM, pramipexole   0.75 mg BID too anxious and reduced to 0.5 mg BID. Tolerating meds now. Patient was administered Spravato  84 mg intranasally today.  The patient experienced the typical dissociation which gradually resolved over the 2-hour period of observation.  There were no complications.  Specifically the patient did not have nausea or vomiting or headache.   Trouble staying asleep longer than 6 hours.  Plan:  Continue sertaline to 200 mg daily.  It has helped anxiety but not depression.   for insomnia, clonazepam  1 mg HS. trazodone  doesn't work well but helps some Continue Dayvigo  10 HS.  Failed resp Belsomra  15 and seroquel  25 She wants to continue Cotempla  or Concerta  bc helps mood early in day and focus  benefit lasts longer.  Have not been able to get stimulant to be consitently effective throughout the day. continue pramipexole  trial off-label and reduce back to  TRD 0.5 mg BID Spravato  weekly.  10/13/22 appt noted: Not sleeping as well.  Wants to increase Spravato  to twice weekly bc feeling more dep close to the next sched date.  Just the day or 2 before.  .  Plan: Trial for off label TRD increase pramipexole  1 mg TID  10/20/22 appt noted: Psych meds:  Clonazepam  0.5 mg tablets 2 nightly, gabapentin  dosage varies, Dayvigo  10 mg nightly, trazodone  100 mg nightly,  sertaline 15o mg . Returned to Cotempla  AM, pramipexole   1 mg TID. Tolerating meds now. Patient was administered Spravato  84 mg intranasally today.  The patient experienced the typical dissociation which gradually resolved over the 2-hour period of observation.  There were no complications.  Specifically the patient did not have nausea or vomiting or headache.   Trouble staying asleep longer than 6 hours.  More dep this week and asked to get Spravato  twice this week. Plan: Trial for off label TRD increase pramipexole  1 mg QID  10/22/22 appt noted:  Psych meds:  Clonazepam  0.5 mg tablets 2 nightly and another one with EMA, gabapentin   dosage varies, Belsomra  20 mg daily,  trazodone  100 mg nightly,  sertaline 15o mg . Returned to Cotempla  AM, pramipexole   1 mg QID for 2nd day Tolerating meds now.  Except a little N and dizzy briefly after takes it. Patient was administered Spravato  84 mg intranasally today.  The patient experienced the typical dissociation which gradually resolved over the 2-hour period of observation.  There were no  complications.  Specifically the patient did not have nausea or vomiting or headache. No change in mood yet.   Sleep better with Belsomra  20 as long as takes with other meds.  If insomnia mood is worse. Ongoing some dep but gets partial relief with Spravato  but would like better relief .  10/27/22 received Spravato  84  11/03/22 appt noted:  Meds as above except sertraline  she cut to 100 mg instead of 200 mg daily She doesn't think sertraline  helping and worries over poss SE Off and on Cotempla  .  Not affecting BP. Doesn't notice benefit or SE with pramipexole  1 mg QID.  No SE Sleep is OK.  Some awakening aftter 6 hours but not enough so takes another clonazepam .   Patient was administered Spravato  84 mg intranasally today.  The patient experienced the typical dissociation which gradually resolved over the 2-hour period of observation.  There were no complications.  Specifically the patient did not have nausea or vomiting or headache. No change in mood yet.   Sleep better with Belsomra  20 as long as takes with other meds.  If insomnia mood is worse. Ongoing some dep but gets partial relief with Spravato  but would like better relief .  No sig benefit with pramipexole  1 mg QID.  11/21/22 TC:  To ER with dep 11/21/22. No SE with selegiline  but feels more dep.  Wants to stop it. Today is only day 2 of 1 tablet of selegiline  5 mg AM.  Reviewed with her that she had not been satisfied with the response of Spravato  and Zoloft  and that she is likely feeling worse because the Zoloft  is wearing off and in particular because she abruptly stopped 150 mg daily.  She needs to give the selegiline  time to work.  She agrees to do so.  She admitted to some passive suicidal thoughts but no active suicidal thoughts and no desire to die.  She agrees to the plan to increase selegiline  to 10 mg every morning this weekend and to continue the selegiline  trial.  She cannot stop this and immediately start another AD due to risk  DDI with serotonin syndrome.  She is aware.  Lorene Macintosh, MD, DFAPA  12/19/22 TC: Pt called at 1:42p.  She said she wanted to know if she could start weaning off the Selegiline .  She thinks increasing the dosage is making things worse.  She said she wants to go back to Spravato .  She acknowleged she has an appt on Monday. I advised her that I wasn't sure Dr Macintosh will see her message before her appt, but she still ask me to send the message.  Next appt 8/26 MD resp.  No change until Monday appt.  12/22/22 appt noted: Meds: selegiline  up to 5 mg 4 daily for a week but felt it incr irritability and went back to 3 mg daily. She wants to go back to Spravato .  Not near as dep as now.  Selegiline  didn't help energy. Still on Belsomra  Thinks maybe sertraline  helped mood more than she thought. More dep than anxiety.   Plan:  DC selegiline  and will retry Effexor  which helped in the past at 300 mg daily.  Start this Friday after over 10 half lives of selegiline .    12/24/22 appt noted: Stopped selegiline   Psych med: belsomra  20, clonazepam  1 mg HS.   She has felt more dep off the Spravato  though at the time didn't think it was helping.  Has felt more down and withdrawn.  Low energy, motivation, not smiling.  No SI.  Sleep ok.   Patient was administered Spravato  84 mg intranasally today.  The patient experienced the typical dissociation which gradually resolved over the 2-hour period of observation.  There were no complications.  Specifically the patient did not have nausea or vomiting or headache. No problems with BP or other unusual SE Mood is a little better after this administration of Spravato .  Very motivated to continue it.  No immediate med concerns. No SI .  Ongoing anhedonia.  12/25/22 appt noted: Psych med: belsomra  20, clonazepam  1 mg HS.  Not resumed stimulant or AD yet   No SE. Patient was administered Spravato  84 mg intranasally today.  The patient experienced the typical dissociation  which gradually resolved over the 2-hour period of observation.  There were no complications.  Specifically the patient did not have nausea or vomiting or headache.  There is a lot things going on in her life and it is somewhat difficult to separate stressors from what is going on with her mood.  However she is happy to resume Spravato  as she decided after stopping briefly that it was helpful.  She plans to start the antidepressant Morrow.  She also wants to resume the stimulant tomorrow.  12/30/22 appt noted; Psych med: belsomra  20, clonazepam  1 mg HS.  Not resumed stimulant.  Effexor  XR 37.5 mg daily   No SE. Patient was administered Spravato  84 mg intranasally today.  The patient experienced the typical dissociation which gradually resolved over the 2-hour period of observation.  There were no complications.  Specifically the patient did not have nausea or vomiting or headache.  She is still having depression with very prominent anhedonia.  However she feels the Spravato  is somewhat helpful and is hopeful for more complete response as she gets back onto a reasonable antidepressant dose.  She tolerated Effexor  XR 300 mg daily in the past and took it for 5 months and it is probably the antidepressants worked best for her.  She is agreeable with this plan.  She is getting sleep benefit from the current medications Plan: incr Effexor  75 mg daily  01/02/23 appt noted: Psych meds as noted above except increase Effexor  XR to 75 mg daily without side effects. Patient was administered Spravato  84 mg intranasally today.  The patient experienced the typical dissociation which gradually resolved over the 2-hour period of observation.  There were no complications.  Specifically the patient did not have nausea or vomiting or headache.  Agrees to increasing Effexor  as planned.  SX not changed from that noted above.  No SI  01/05/23 appt noted: Psych meds as noted above except increase Effexor  XR to 112.5 mg daily  without side effects. Patient was administered Spravato  84 mg intranasally today.  The patient experienced the typical dissociation which gradually resolved over the 2-hour period of observation.  There were no complications.  Specifically the patient did not have nausea or vomiting or headache.  Agrees to increasing Effexor  as planned gradually to 300 mg daily.  SX not changed from that noted above.  Does however feel Spravato  has dampened the degree of depression.  No SI Stressful life event discussed and won't be resolved until early next year.  01/07/23 appt noted: Psych meds Effexor  XR 112.5 mg daily. Patient was administered Spravato  84 mg intranasally today.  The patient experienced the typical dissociation which gradually resolved over the 2-hour period of observation.  There were no complications.  Specifically the patient did not have nausea or vomiting or headache.  Agrees to increasing Effexor  as planned gradually to 300 mg daily.  SX not changed from that noted above.  Does however feel Spravato  has dampened the degree of depression markedly compared to when off psych meds and no Spravato .  No SI  01/12/23 appt noted; Psych meds Effexor  XR 225 mg daily. No SE notedl. Patient was administered Spravato  84 mg intranasally today.  The patient experienced the typical dissociation which gradually resolved over the 2-hour period of observation.  There were no complications.  Specifically the patient did not have nausea or vomiting.  But is having headache.  Agrees to increasing Effexor  as planned gradually to 300 mg daily.  SX not changed from that noted above.  Does however feel Spravato  has dampened the degree of depression markedly compared to when off psych meds and no Spravato .  No SI  01/15/23 appt noted:  Psych meds Effexor  XR 225 mg daily. No SE noted. Patient was administered Spravato  84 mg intranasally today.  The patient experienced the typical dissociation which gradually resolved  over the 2-hour period of observation.  There were no complications.  Specifically the patient did not have nausea or vomiting.   Doing ok with higher doses of Effexor  and will plan to increase to 300 mg daily. Mild improvement noticed so far with the incrase in meds.   Uses Cotempla  prn and it helps but doesn't liek the way she feels when it wears off.  01/22/23 appt noted: Psych meds reduced Effexor  XR 150 mg daily. Bc of HA No SE noted. Patient was administered Spravato  84 mg intranasally today.  The patient experienced the typical dissociation which gradually resolved over the 2-hour period of observation.  There were no complications.  Specifically the patient did not have nausea or vomiting.  Ongoing moderate levels of depression with some improvement from the Spravato .  She is not sure about the effect of the venlafaxine  on her mood yet.  She would like to continue to try pursuing a stimulant but has had difficulty tolerating p.o. versions of stimulants because of the crash at the end and side effects with many of them.  She has several questions about this.  01/27/23 appt noted: Psych meds: Clonazepam  1 to 1.5 mg nightly, lithium  CR 300 nightly, Belsomra  20 nightly, venlafaxine  XR 150 every morning No side effects noted at the current dosage. Patient was administered Spravato  84 mg intranasally today.  The patient experienced the typical dissociation which gradually resolved over the 2-hour period of observation.  There were no complications.  Specifically the patient did not have nausea or vomiting.  Ongoing moderate levels of depression with some improvement from the Spravato .   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  She does not tolerate oral stimulants very well because she has a crash as they wear off which is very dysphoric.  She is willing to retry the Daytrana  patches that she took in the spring.  She took 1 patch on  1 occasion recently and  felt it was helpful. However she is also having problems with early morning awakening over the last week without precipitant.  She would like to have a different treatment strategy for sleep but understands her sleep problems have historically been difficult to resolve and maintain.  02/10/23 appt noted: Psych meds: Clonazepam  1 to 1.5 mg nightly, lithium  CR 300 nightly, Belsomra  20 nightly, venlafaxine  XR 150 every morning, Daytrana  30 mg Am No side effects noted at the current dosage. Patient was administered Spravato  84 mg intranasally today.  The patient experienced the typical dissociation which gradually resolved over the 2-hour period of observation.  There were no complications.  Specifically the patient did not have nausea or vomiting.  Ongoing moderate levels of depression with some improvement from the Spravato .  Particularly anhedonia.  No sig benefit noted with Daytrana  and problems with table stimulants bc gets brief benefit with unpleasant crash.  Wants to try another type of patch if available.  02/17/23 appt noted: Psych meds: Clonazepam  1 to 1.5 mg nightly, lithium  CR 300 nightly, Belsomra  20 nightly, venlafaxine  XR 225 mg every morning, Daytrana  30 mg Am No side effects noted at the current dosage. Patient was administered Spravato  84 mg intranasally today.  The patient experienced the typical dissociation which gradually resolved over the 2-hour period of observation.  There were no complications.  Specifically the patient did not have nausea or vomiting.  Ongoing moderate levels of depression with some improvement from the Spravato .  Particularly anhedonia.  She has not gotten the new stimulant yet but is hopeful. No problems with increasing venlafaxine .   Mood is better with Spravato  and venlafaxine .  Less anhedonia, less down. Themazepam not helpful.    03/04/23 received Spravato  84 03/12/23 received Spravato  84  03/19/23 appt noted: Psych meds:  Clonazepam  1 to 1.5 mg nightly, lithium  CR 300 nightly, Belsomra  20 nightly, venlafaxine  XR 300 mg every morning, tried various stimulants lately No side effects noted at the current dosage. Patient was administered Spravato  84 mg intranasally today.  The patient experienced the typical dissociation which gradually resolved over the 2-hour period of observation.  There were no complications.  Specifically the patient did not have nausea or vomiting.  Not getting good duration from oral stimulants.  She would like to try another patch but has to take tabs first and try them before insurance will cover patches.  Overall dep is better with meds and Spravato .  Clearly sees benefit.  03/30/23 appt noted: Psych meds: Clonazepam  1 to 1.5 mg nightly, lithium  CR 300 nightly,  venlafaxine  XR 300 mg every morning, tried various stimulants lately, gabapentin  900 mg HS, dexmethylphenidate  ER without help No side effects noted at the current dosage. Patient was administered Spravato  84 mg intranasally today.  The patient experienced the typical dissociation which gradually resolved over the 2-hour period of observation.  There were no complications.  Specifically the patient did not have nausea or vomiting.  Able to leave at end of 2 hour period without assistance.  Clear mood benefit with Spravato  with much less dep and wants to continue. Still trouble sleeping. TR insomnia.  Ongoing conc problems with minimal brief benefit from stimulants.  Not able to get patch Xelstrem yet DT insurance.  04/14/23 appt noted: Psych meds: Clonazepam  1 to 1.5 mg nightly, lithium  CR 300 nightly,  she reduced on her own venlafaxine  XR to 150 mg every morning, tried various stimulants lately, gabapentin  900 mg HS, dexmethylphenidate  ER without help No side effects noted at the  current dosage. Patient was administered Spravato  84 mg intranasally today.  The patient experienced the typical dissociation which gradually resolved over the  2-hour period of observation.  There were no complications.  Specifically the patient did not have nausea or vomiting.  Able to leave at end of 2 hour period without assistance.  Clear mood benefit with Spravato  with much less dep and wants to continue. she went to [facility] and had her brain scanned at [facility]. they recommend she taper off Effexor  which she is only taking 150 mg currently and start Auvelity . She reduce Effexor  on her own to 150 mg daily. She wants further improvement in anhedonia.    04/28/23 received Spravato  84  05/11/23 appt noted: Psych meds: Clonazepam  1 to 1.5 mg nightly, lithium  CR 300 nightly,  venlafaxine  XR to 150 mg every morning, Auvelity  BID, tried various stimulants lately, gabapentin  900 mg HS, dexmethylphenidate  ER without help No side effects noted at the current dosage. Patient was administered Spravato  84 mg intranasally today.  The patient experienced the typical dissociation which gradually resolved over the 2-hour period of observation.  There were no complications.  Specifically the patient did not have nausea or vomiting.  Able to leave at end of 2 hour period without assistance.  Clear mood benefit with Spravato  with much less dep and wants to continue. Overall her mood is still significantly better with the combination of Spravato  venlafaxine  and Auvelity  in particular.  She has not had any problems from the reduction in venlafaxine  but is not dramatically improved with regard to residual depression since adding Auvelity  but she is not having side effects.  We agreed that a longer trial of the helpful.  She is also wondering about increasing the Spravato  frequency from once every other week to once every 10 days in order to prove improved residual depression and specifically anhedonia.  Overall though she is dramatically better with the current combination of meds. Plan: continue venlafaxine  ER 150 with Auvelity  BID bc TRD  05/25/23 appt noted: Psych meds:  Clonazepam  1 to 1.5 mg nightly, lithium  CR 300 nightly,  venlafaxine  XR to 150 mg every morning, Auvelity  BID, tried various stimulants lately, gabapentin  900 mg HS, dexmethylphenidate  ER without help No side effects noted at the current dosage. Patient was administered Spravato  84 mg intranasally today.  The patient experienced the typical dissociation which gradually resolved over the 2-hour period of observation.  There were no complications.  Specifically the patient did not have nausea or vomiting.  Able to leave at end of 2 hour period without assistance.  Clear mood benefit with Spravato  with much less dep and wants to continue.  06/04/23 appt noted: Psych meds: Clonazepam  1 to 1.5 mg nightly, lithium  CR 300 nightly,  venlafaxine  XR to 150 mg every morning, Auvelity  1 in the AM, tried various stimulants lately, gabapentin  900 mg HS, no current stimulant (Vyvanse  40 NR) She reduced Auvelity  to 1 daily DT tremor she thinks it made worse and bc no benefit noted with it. She is interested in increasing the Effexor  at 300 mg daily bc did get some benefit from it in the past and tolerated that dos. Chronic dep is better with Spravato  but not gone.  Some anhedonia.  Focus is still not good. Problems getting patch ADD med. Patient was administered Spravato  84 mg intranasally today.  The patient experienced the typical dissociation which gradually resolved over the 2-hour period of observation.  There were no complications.  Specifically the patient did  not have nausea or vomiting.  Able to leave at end of 2 hour period without assistance.  Clear mood benefit with Spravato  with much less dep and wants to continue.  AIMS    Flowsheet Row Video Visit from 01/03/2022 in [facility]  AIMS Total Score 0      ECT-MADRS    Flowsheet Row Clinical Support from 08/13/2022 in [facility] Video Visit from 04/29/2022 in [facility]  Video Visit from 02/06/2022 in [facility]  MADRS Total Score 27 44 23      GAD-7    Flowsheet Row Counselor from 12/19/2022 in [facility] Office Visit from 08/22/2022 in [facility] [place]  Total GAD-7 Score 4 7      PHQ2-9    Flowsheet Row Counselor from 12/19/2022 in [facility] Office Visit from 08/22/2022 in [facility] [place] Office Visit from 08/19/2022 in [facility] for [facility] Video Visit from 02/06/2022 in [facility] Office Visit from 11/16/2020 in [facility] at [facility]  PHQ-2 Total Score 2 3 0 6 0  PHQ-9 Total Score 5 5 -- 14 --        Past Psychiatric Medication Trials: Trintellix - Initially effective and then was not effective when re-started Paxil - Ineffective. Helped initially. Prozac-Insomnia Lexapro - Effective and then no longer as effective Zoloft - Initially effective and then minimal response Viibryd  Cymbalta- Ineffective. Helped initially. Effexor  XR 300- Effective, well tolerated. Pristiq - Increased anxiety at 150 mg daily Wellbutrin  XL-Ineffective.May have increased anxiety. Amitriptyline Nortriptyline -Caused irritability, constipation Auvelity - ineffective Selegilne brief 20 mg daily , more irritable  Abilify - Helpful but caused severe insomnia. Rexulti -Helpful but caused insomnia. Vraylar  Seroquel  - Ineffective Risperdal Olanzapine- Ineffective Latuda - Ineffective Caplyta   Klonopin - Effective Temazepam - Took during menopause Lunesta- Ineffective Ambien-Ineffective Sonata- Ineffective Dayvigo - partially effective  Belsomra  20 worked and then failed  Trazodone -  somewhat effective Remeron-Ineffective. Does not recall wt gain.  Quetiapine  25 NR  Adderall- Took once and had adverse  effect. Concerta - Effective but only 4-6 hours Jornay 80 NR Metadate -Not as effective as Concerta  Dexmethylphenidate  IR NR Dexmethylphenidate  ER 10/02/21- Not as effective as Concerta  Azstarys - some side effects. Not as effective.  Ritalin  short acting and SE anxiety & HTN @20  TID Cotempla  17.3  Daytrana  NR Vyvanse  40 NR  Lamictal- May have had an adverse effects. Felt weird. Reports worsening s/s.  Lithium  ? effect Gabapentin  Pramipexole - 1 mg QID NR  ECT #20 with minimal response. H says it was partly helpful.  AIMS    Flowsheet Row Video Visit from 01/03/2022 in [facility]  AIMS Total Score 0      ECT-MADRS    Flowsheet Row Clinical Support from 08/13/2022 in [facility] Video Visit from 04/29/2022 in [facility] Video Visit from 02/06/2022 in [facility]  MADRS Total Score 27 44 23      GAD-7    Flowsheet Row Counselor from 12/19/2022 in [facility] Office Visit from 08/22/2022 in [facility] [place]  Total GAD-7 Score 4 7      PHQ2-9    Flowsheet Row Counselor from 12/19/2022 in [facility] Office Visit from 08/22/2022 in [facility] [place] Office Visit from 08/19/2022  in [facility] for [facility] at [facility] Video Visit from 02/06/2022 in [facility] Office Visit from 11/16/2020 in [facility] for [facility] at [facility]  PHQ-2 Total Score 2 3 0 6 0  PHQ-9 Total Score 5 5 -- 14 --        Review of Systems:  Review of Systems  Constitutional:  Positive for fatigue.  Cardiovascular:  Negative for palpitations.  Neurological:  Positive for tremors. Negative for dizziness and headaches.  Psychiatric/Behavioral:  Positive for decreased concentration, dysphoric mood and sleep  disturbance. Negative for agitation, behavioral problems, confusion, hallucinations and suicidal ideas. The patient is nervous/anxious.     Medications: I have reviewed the patient's current medications.  Current Outpatient Medications  Medication Sig Dispense Refill   clonazePAM  (KLONOPIN ) 1 MG tablet Take 1-2 tablets (1-2 mg total) by mouth at bedtime. 60 tablet 0   Esketamine HCl, 84 MG Dose, (SPRAVATO , 84 MG DOSE,) 28 MG/DEVICE SOPK USE 1 SPRAY IN EACH NOSTRIL ONCE A WEEK 3 each 2   Estradiol  (VAGIFEM ) 10 MCG TABS vaginal tablet Place 1 tablet (10 mcg total) vaginally 2 (two) times a week. 24 tablet 3   gabapentin  (NEURONTIN ) 300 MG capsule 2 at bedtime and 1-2 capsule if need for early morning awakening. 120 capsule 1   lisdexamfetamine (VYVANSE ) 40 MG capsule Take 1 capsule (40 mg total) by mouth every morning. 30 capsule 0   lithium  carbonate (LITHOBID ) 300 MG ER tablet Take 2 tablets (600 mg total) by mouth at bedtime. 180 tablet 0   traZODone  (DESYREL ) 50 MG tablet TAKE 1 TABLET BY MOUTH AT BEDTIME 30 tablet 0   valACYclovir  (VALTREX ) 500 MG tablet Take one twice daily for 3 days with any symptoms 30 tablet 3   valsartan  (DIOVAN ) 80 MG tablet Take 1 tablet (80 mg total) by mouth daily. 90 tablet 3   Dextroamphetamine  (XELSTRYM ) 13.5 MG/9HR PTCH Place 1 patch onto the skin every morning. (Patient not taking: Reported on 06/04/2023) 30 patch 0   venlafaxine  XR (EFFEXOR -XR) 75 MG 24 hr capsule Take 3 capsules (225 mg total) by mouth daily with breakfast. 90 capsule 1   No current facility-administered medications for this visit.    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   TRANSAMINASES, SERUM, ELEVATED 08/14/2009    Past Medical History, Surgical history, Social history, and Family history were reviewed and updated as appropriate.   Please see review of systems for further details on the patient's review from today.   Objective:   Physical Exam:  LMP 12/27/2007 (Exact Date)   Physical Exam Constitutional:      General: She is not in acute distress. Musculoskeletal:        General: No deformity.  Neurological:     Mental Status: She is alert and oriented to person, place, and time.     Motor: Tremor present.     Coordination: Coordination normal.     Comments: Minimal tremor  Psychiatric:        Attention and Perception: Perception normal.  She is inattentive. She does not perceive auditory hallucinations.        Mood and Affect: Mood is anxious and depressed. Affect is blunt. Affect is not tearful.        Speech: Speech normal. Speech is not rapid and pressured or slurred.        Behavior: Behavior normal.        Thought Content: Thought content normal. Thought content is not delusional. Thought content does not include homicidal or suicidal ideation. Thought content does not include suicidal plan.        Cognition and Memory: Cognition and memory normal.        Judgment: Judgment normal.     Comments: Insight intact 4/10 dep but better with Spravato  markedly. Affect blunted chronically     Lab Review:     Component Value Date/Time   NA 132 (L) 10/27/2022 1107   K 4.4 10/27/2022 1107   CL 95 (L) 10/27/2022 1107   CO2 30 10/27/2022 1107   GLUCOSE 89 10/27/2022 1107   BUN 14 10/27/2022 1107   CREATININE 0.67 10/27/2022 1107   CREATININE 0.68 01/26/2020 0920   CALCIUM 10.1 10/27/2022 1107   PROT 8.3 04/22/2022 0932   ALBUMIN 5.3 (H) 04/22/2022 0932   AST 82 (H) 04/22/2022 0932   ALT 118 (H) 04/22/2022 0932   ALKPHOS 74 04/22/2022 0932   BILITOT 0.3 04/22/2022 0932   GFRNONAA >90 05/01/2014 1645    GFRAA >90 05/01/2014 1645       Component Value Date/Time   WBC 3.3 (L) 04/22/2022 0932   RBC 4.25 04/22/2022 0932   HGB 14.3 04/22/2022 0932   HGB 14.1 10/26/2014 1034   HCT 40.7 04/22/2022 0932   PLT 245.0 04/22/2022 0932   MCV 95.7 04/22/2022 0932   MCH 34.4 (H) 01/26/2020 0920   MCHC 35.1 04/22/2022 0932   RDW 13.2 04/22/2022 0932   LYMPHSABS 0.9 04/22/2022 0932   MONOABS 0.2 04/22/2022 0932   EOSABS 0.1 04/22/2022 0932   BASOSABS 0.0 04/22/2022 0932    No results found for: POCLITH, LITHIUM    No results found for: PHENYTOIN, PHENOBARB, VALPROATE, CBMZ   .res Assessment: Plan:    Sharea was seen today for follow-up, depression, anxiety, stress and add.  Diagnoses and all orders for this visit:  Recurrent major depression resistant to treatment (HCC) -     venlafaxine  XR (EFFEXOR -XR) 75 MG 24 hr capsule; Take 3 capsules (225 mg total) by mouth daily with breakfast.  Generalized anxiety disorder -     venlafaxine  XR (EFFEXOR -XR) 75 MG 24 hr capsule; Take 3 capsules (225 mg total) by mouth daily with breakfast.  Attention deficit hyperactivity disorder (ADHD), predominantly inattentive type  Insomnia, unspecified type   Pt seen for TRD with multiple failed medications as noted above.    Mood was reportedly better with venlafaxine  XR 300 with Spravato  but ongoing dep with anhedonia and fatigue.  Then she went to the [facility] and they rec Auvelity  and she reduced venlafaxine  on her own to 150 mg  and added Auvelity  BID.    There is an ongoing pattern of her making impulsive decisions on her own about meds which are counter therapeutic and not informing MD of what is going on until after th fact.  That is not a helpful pattern and she is encouraged to stop it.  Patient was administered Spravato  84 mg intranasally today.  The patient experienced the typical dissociation which gradually resolved over the 2-hour period  of observation.  There were no  complications.  Specifically the patient did not have nausea or vomiting or headache.  Blood pressures remained within normal ranges at the 40-minute and 2-hour follow-up intervals.  By the time the 2-hour observation period was met the patient was alert and oriented and able to exit without assistance.  Patient feels the Spravato  administration is helpful for the treatment resistant depression and would like to continue the treatment.  See nursing note for further details. Would like to increase frequency from every other week to every 10 days looking for further improvement in depression.  Consider switch Spravato  to MAOI, Parnate  We discussed the short-term risks associated with benzodiazepines including sedation and increased fall risk among others.  Discussed long-term side effect risk including dependence, potential withdrawal symptoms, and the potential eventual dose-related risk of dementia.  But recent studies from 2020 dispute this association between benzodiazepines and dementia risk. Newer studies in 2020 do not support an association with dementia.  for insomnia, wants to continue clonazepam  2 mg nightly. Can't sleep without it.   Reports needing gabapentin  [phone] mg HS to help sleep.  Cotempla  or Concerta  bc helps mood early in day and focus  benefit lasts longer.  Have not been able to get stimulant to be consitently effective throughout the day. Conisder switching to patch bc pt has significant crash with stimulants or perhaps Jornay.  Stimulant tablets cause crash with inadequate duration .  Patches should provide smoother response.  Will try another patch, Xelstrym  if we can get it approved.   Insurance denied PA for this medication stating she has to try dextroamphetamine  ER, Adderall XR, and Vyvanse  before will be considered. Have been reluctant to try Adderall XR because she did not tolerate Adderall at home.     Discussed potential benefits, risks, and side effects of  stimulants with patient to include increased heart rate, palpitations, insomnia, increased anxiety, increased irritability, or decreased appetite.  Instructed patient to contact office if experiencing any significant tolerability issues.  Dont' decrease venlafaxine  further from 150 mg at this time.  Disc SE in detail and SSRI withdrawal sx.  NR so DC Auvelity  trial BID per rec of [facility].  Documents reviewed from them.   To max opportunity to respond for tRD, lithium  300 mg PM.  Disc pretreating Spravato  with NSAID or Tylenol  bc of HA lately.  trazodone   50 mg HS  Supportive therapy dealing with serious stressful life event.  Plan: continue Clonazepam  1 to 1.5 mg nightly, lithium  CR 300 nightly,  venlafaxine  XR to 300 mg every morning, DC Auvelity  BID, tried various stimulants lately, gabapentin  900 mg HS,   FU every 7-10 days  Lorene Macintosh, MD, DFAPA    Please see After Visit Summary for patient specific instructions.  Future Appointments  Date Time Provider Department Center  06/16/2023  9:00 AM Cottle, Lorene KANDICE Raddle., MD [facility] None  06/16/2023  9:00 AM CP-NURSE [facility] None  08/21/2023  8:00 AM Micheal Wolm ORN, MD [facility]  09/18/2023  8:15 AM Cleotilde Ronal RAMAN, MD [facility]    No orders of the defined types were placed in this encounter.   -------------------------------

## 2023-06-04 NOTE — Progress Notes (Signed)
 NURSES NOTE:           Patient arrived for her #66 Spravato  treatment. Pt is coming every other week now. Pt is being treated for Treatment Resistant Depression, pt will be receiving 84 mg (3 of the 28 mg) nasal sprays, this is her maintenance dose. Patient taken to treatment room, I explained and discussed her treatment and how the schedule should go, along with any side effects that may occur. Answered any questions and concerns the patient had. Pt's Spravato  is delivered through [facility] and she is now billing with her insurance. Spravato  medication is stored at doctors office per REMS/FDA guidelines. The medication is required to be locked behind two doors per FDA/REMS Protocol. Medication is also disposed of properly per regulations. All treatments and her vital signs are documented in Spravato  REMS per protocol of treatment center regulations. [facility] delivers her medication.      Vital Signs assessed at 8:05 AM 108/69, pulse 57 pulse ox 92%. Instructed pt to blow her nose and recline back slightly to prevent any of medication dripping out of her nose.   Pt. Given 1st dose (28 mg inhaler), then 5 minutes between 2nd dose and 3rd dose for total of 84 mg. No complaints of nausea/vomiting reported. Pt did listen to music today, she just reclined back and closed her eyes. After 40 minutes I went to assess her vital signs, no side effects or symptoms reported, B/P 117/74, pulse 72.  Dr. Geoffry talked to pt today about her treatment. Nurse was with pt a total of 60 minutes but pt observed for 120 minutes per protocol. Discharge vital signs were at 9:55 AM, B/P 114/74, pulse 62. She verbalized understanding. She reports dissociation but she was clear upon her discharge. She is instructed to contact office if needing anything prior to her next treatment.  She is scheduled on February 18th.   LOT # [id number]   EXP Y [id number]

## 2023-06-05 ENCOUNTER — Other Ambulatory Visit: Payer: Self-pay | Admitting: Psychiatry

## 2023-06-05 DIAGNOSIS — F339 Major depressive disorder, recurrent, unspecified: Secondary | ICD-10-CM

## 2023-06-05 NOTE — Telephone Encounter (Signed)
 I will discuss with pt next week

## 2023-06-08 ENCOUNTER — Ambulatory Visit: Payer: Self-pay | Admitting: Family Medicine

## 2023-06-09 ENCOUNTER — Encounter: Payer: Self-pay | Admitting: Psychiatry

## 2023-06-09 ENCOUNTER — Ambulatory Visit: Payer: 59 | Admitting: Professional Counselor

## 2023-06-10 ENCOUNTER — Ambulatory Visit: Payer: Self-pay | Admitting: Family Medicine

## 2023-06-10 ENCOUNTER — Other Ambulatory Visit: Payer: Self-pay | Admitting: Psychiatry

## 2023-06-10 DIAGNOSIS — F339 Major depressive disorder, recurrent, unspecified: Secondary | ICD-10-CM

## 2023-06-15 ENCOUNTER — Other Ambulatory Visit: Payer: Self-pay | Admitting: Psychiatry

## 2023-06-15 DIAGNOSIS — F339 Major depressive disorder, recurrent, unspecified: Secondary | ICD-10-CM

## 2023-06-16 ENCOUNTER — Encounter: Payer: Self-pay | Admitting: Psychiatry

## 2023-06-16 ENCOUNTER — Ambulatory Visit: Payer: 59 | Admitting: Psychiatry

## 2023-06-16 ENCOUNTER — Ambulatory Visit: Payer: 59

## 2023-06-16 VITALS — BP 116/73 | HR 72

## 2023-06-16 DIAGNOSIS — G4700 Insomnia, unspecified: Secondary | ICD-10-CM

## 2023-06-16 DIAGNOSIS — F411 Generalized anxiety disorder: Secondary | ICD-10-CM | POA: Diagnosis not present

## 2023-06-16 DIAGNOSIS — F339 Major depressive disorder, recurrent, unspecified: Secondary | ICD-10-CM | POA: Diagnosis not present

## 2023-06-16 DIAGNOSIS — F900 Attention-deficit hyperactivity disorder, predominantly inattentive type: Secondary | ICD-10-CM

## 2023-06-16 NOTE — Progress Notes (Signed)
 Heather Davila [id number] 07-04-1960 63 y.o.  Subjective:   Patient ID:  Heather Davila is a 63 y.o. (DOB Jan 07, 1961) female.  Chief Complaint:  Chief Complaint  Patient presents with   Follow-up   Depression   ADD   Sleeping Problem   Medication Reaction    HPI Heather Davila presents to the office today for follow-up of treatment resistant recurrent depression, generalized anxiety disorder and insomnia.  Almost too numerous to count med failures. She is here for Spravato administration for her treatment resistant depression.  05/21/22 appt noted: Patient was administered first dose Spravato 56 mg intranasally today.  The patient experienced the typical dissociation which gradually resolved over the 2-hour period of observation.  There were no complications.  Specifically the patient did not have nausea or vomiting or headache.   Dissociation was mild. Did not experience any emotional relief from disabling depression.wants to increase Spravato to the usual dose.  She is aware insurance is not covering the costs at this time.  No SI acutely. Tolerating meds.    05/23/22 appt noted: Patient was administered Spravato 84 mg intranasally today.  The patient experienced the typical dissociation which gradually resolved over the 2-hour period of observation.  There were no complications.  Specifically the patient did not have nausea or vomiting.   Dissociation was greater than last dose and a little bothersome DT some HA. Tolerating meds.   Mood has not changed significantly thus far with Spravato.  05/27/22 appt noted: Patient was administered Spravato 84 mg intranasally today.  The patient experienced the typical dissociation which gradually resolved over the 2-hour period of observation.  There were no complications.  Specifically the patient did not have nausea or vomiting or headache. Today she has felt a little relief from the pressing heaviness of depression but a little more anxiety.   During administration of Spravato she listens to [place] music and was able to relax a little more with the feeling of dissociation that was mildly bothersome. Husband was also in on the session today and asked some questions about IV ketamine versus nasal spray ketamine.  05/29/22 appt noted: Cancelled today DT hypertension  05/30/22 appt noted: Patient was administered Spravato 84 mg intranasally today.  The patient experienced the typical dissociation which gradually resolved over the 2-hour period of observation.  There were no complications.  Specifically the patient did not have nausea or vomiting or headache. She is seeing some improvement in mood with less continuous depression and less intensity.  Hopefulness is better.   No med complaints or SE  06/05/22 appt noted: Patient was administered Spravato 84 mg intranasally today.  The patient experienced the typical dissociation which gradually resolved over the 2-hour period of observation.  There were no complications.  Specifically the patient did not have nausea or vomiting or headache.  Specifically HA is less of a problem with Spravato. No SE with meds. Depression is improving with less intensity.  Intermittent anxiety easily.  More able to enjoy things.  No new concerns.  06/17/22 appt noted: Patient was administered Spravato 84 mg intranasally today.  The patient experienced the typical dissociation which gradually resolved over the 2-hour period of observation.  There were no complications.  Specifically the patient did not have nausea or vomiting or headache.  Specifically HA is less of a problem with Spravato. No SE with meds. Depression is improved 45% with less intensity.  Intermittent anxiety less easily.  More able to enjoy things.  No new concerns.  More active. Current meds: increased clonazepam to about 1.5 mg HS DT recent insomnia, lexapro 20, Dayvigo 10 mg HS, concerta 36 mg AM, trazodone 100 mg HS.  06/20/22 appt  noted: Current meds: increased clonazepam to about 1.5 mg HS DT recent insomnia, lexapro 20, Dayvigo 10 mg HS, concerta 36 mg AM, trazodone 100 mg HS. Patient was administered Spravato 84 mg intranasally today.  The patient experienced the typical dissociation which gradually resolved over the 2-hour period of observation.  There were no complications.  Specifically the patient did not have nausea or vomiting or headache.  Specifically HA is less of a problem with Spravato. No SE with meds. Depression is improved 45% with less intensity.  Intermittent anxiety less easily.  More able to enjoy things.  No new concerns.  More active.  06/23/22 appt noted:  Current meds: increased clonazepam to about 1.5 mg HS DT recent insomnia, lexapro 20, Dayvigo 10 mg HS, concerta 36 mg AM, trazodone 100 mg HS. Patient was administered Spravato 84 mg intranasally today.  The patient experienced the typical dissociation which gradually resolved over the 2-hour period of observation.  There were no complications.  Specifically the patient did not have nausea or vomiting or headache.  Specifically HA is less of a problem with Spravato. No SE with meds. Intensity of Spravato dissociation varies.  Last week very intesne and this week more typical.  Depression is continuing to improve with better interest and activity and motivation.  Gradual improvement.  Wants to continue.  06/25/22 appt noted: Current meds: increased clonazepam to about 1.5 mg HS DT recent insomnia, lexapro 20, Dayvigo 10 mg HS, concerta 36 mg AM, trazodone 100 mg HS. Patient was administered Spravato 84 mg intranasally today.  The patient experienced the typical dissociation which gradually resolved over the 2-hour period of observation.  There were no complications.  Specifically the patient did not have nausea or vomiting or headache.  Specifically HA is less of a problem with Spravato. No SE with meds. Intensity of Spravato dissociation varies.  No  scary and well tolerated. Continues to gradually improve with Spravato.  She is more motivated and enjoying things more.  H sees progress.  Wants to continue twice weekly until gets maximum response.  Dep is not gone yet.  06/30/22 appt noted:  seen with H Current meds: increased clonazepam to about 1.5 mg HS DT recent insomnia, lexapro 20, Dayvigo 10 mg HS, concerta 36 mg AM, trazodone 100 mg HS. Patient was administered Spravato 84 mg intranasally today.  The patient experienced the typical dissociation which gradually resolved over the 2-hour period of observation.  There were no complications.  Specifically the patient did not have nausea or vomiting or headache.  Specifically HA is less of a problem with Spravato. No SE with meds. Intensity of Spravato dissociation varies.  No scary and well tolerated. Continues to gradually improve with Spravato.  She is more motivated and enjoying things more.  H sees even more progress than she does; more active and smiling.  Wants to continue twice weekly until gets maximum response.  Dep is 80% better.  07/07/22 appt noted: Current meds: increased clonazepam to about 1.5 mg HS DT recent insomnia, lexapro 20, Dayvigo 10 mg HS, concerta 36 mg AM, trazodone 100 mg HS. Patient was administered Spravato 84 mg intranasally today.  The patient experienced the typical dissociation which gradually resolved over the 2-hour period of observation.  There were no complications.  Specifically the patient did not  have nausea or vomiting or headache.  Specifically HA is less of a problem with Spravato. No SE with meds. Intensity of Spravato dissociation varies.  No scary and well tolerated.  Was more intese today. Overall mood is markedly better and please with meds. No changes desired.  07/09/22 appt noted: In transition from Lexapro to sertraline and missing Concerta bc CO crash from it after about 4 hours.  Disc these concerns.  She'll discuss also with Corie Chiquito,  NP More dep the last couple of days and concerned about reducing Spravato frequency while transition from one antidep to another.  Affect less positive. Patient was administered Spravato 84 mg intranasally today.  The patient experienced the typical dissociation which gradually resolved over the 2-hour period of observation.  There were no complications.  Specifically the patient did not have nausea or vomiting or headache.  Specifically HA is less of a problem with Spravato. No SE with meds. Intensity of Spravato dissociation varies.  No scary and well tolerated.   07/14/22 appt noted: Has been more depressed this week.  More trouble with sleep.  Taking clonazepam 1-1.5  of the 0.5 mg tablets.   Trazodone not helping much. She switched back from 200 sertraline to Lexapro 20.  No SE More trouble with motivation.  Some stress with son with special needs. Misses the benevit of Concerta but it was too short acting and crashed in 4-6 hours. Patient was administered Spravato 84 mg intranasally today.  The patient experienced the typical dissociation which gradually resolved over the 2-hour period of observation.  There were no complications.  Specifically the patient did not have nausea or vomiting or headache.  Specifically HA is less of a problem with Spravato. No SE with meds. Intensity of Spravato dissociation varies.  No scary and well tolerated.   07/17/22 appt noted: Patient was administered Spravato 84 mg intranasally today.  The patient experienced the typical dissociation which gradually resolved over the 2-hour period of observation.  There were no complications.  Specifically the patient did not have nausea or vomiting or headache.  Specifically HA is less of a problem with Spravato. No SE with meds. Intensity of Spravato dissociation varies.  Not scary and well tolerated.  Just got Jornay last night but wasn't sure how to take it.  Wants to start and this was discussed.  She felt MPH helped  mood and attn but didn't last long enough and had crash after a few hours on Concerta.  Better for 2 hours after each dose Ritalin 20 mg with mood but then it wore off.  BP up after 3rd dose 160/90 and then took H's amlodipine 5 and BP became normal.  07/22/22 appt noted: Patient was administered Spravato 84 mg intranasally today.  The patient experienced the typical dissociation which gradually resolved over the 2-hour period of observation.  There were no complications.  Specifically the patient did not have nausea or vomiting or headache.  Specifically HA is less of a problem with Spravato. No SE with meds. Intensity of Spravato dissociation varies.  Not scary and well tolerated.  Start Jornay 20 mg over the weekend and did not notice any particular effect good or bad.  Increased to 40 mg. Other psych meds: Clonazepam 0.5 mg tablets 2 nightly, gabapentin dosage varies, Dayvigo 10 mg nightly, Lexapro 20 mg daily, to trazodone 100 mg nightly  07/24/22 appt noted: Patient was administered Spravato 84 mg intranasally today.  The patient experienced the typical dissociation which gradually  resolved over the 2-hour period of observation.  There were no complications.  Specifically the patient did not have nausea or vomiting or headache.  Specifically HA is less of a problem with Spravato. No SE with meds. Intensity of Spravato dissociation varies.  Not scary and well tolerated.  Other psych meds: Clonazepam 0.5 mg tablets 2 nightly, gabapentin dosage varies, Dayvigo 10 mg nightly, Lexapro 20 mg daily, to trazodone 100 mg nightly, Jornay 40 mg PM Doing well with Spravato.  Noticed a little effect from the Jornay 40 without SE but would like to increase the dose for ADD and mood.  Sleeping well.  No new concerns.  Still more depressed than she was with th initial benefit of Spravato.  07/30/22 note:  Patient was administered Spravato 84 mg intranasally today.  The patient experienced the typical  dissociation which gradually resolved over the 2-hour period of observation.  There were no complications.  Specifically the patient did not have nausea or vomiting or headache.  Specifically HA is less of a problem with Spravato. Other psych meds: Clonazepam 0.5 mg tablets 2 nightly, gabapentin dosage varies, Dayvigo 10 mg nightly, Lexapro 20 mg daily, to trazodone 100 mg nightly, Jornay 60 mg PM Wants to increase Jornay to 80 mg PM to increase benefit for ADD and depression. No SE She has experienced a major family crisis that threatens to change her daily life from now on.  It has not triggered suicidal thoughts at this time but she is very afraid.  No desire to change medications.  08/04/22 appt noted" Patient was administered Spravato 84 mg intranasally today.  The patient experienced the typical dissociation which gradually resolved over the 2-hour period of observation.  There were no complications.  Specifically the patient did not have nausea or vomiting or headache.  Specifically HA is less of a problem with Spravato. Other psych meds: Clonazepam 0.5 mg tablets 2 nightly, gabapentin dosage varies, Dayvigo 10 mg nightly, Lexapro 20 mg daily, trazodone 100 mg nightly, Jornay 80 mg PM No SE No benefit or SE with increase Jormay 80 mg.  Says Concerta helped ADD and depression but not Jornay.  However duration was insufficient.  Still depressed and wants change to another form of stimulant.  No concerns with other meds. Continues with strong family stressors creating uncertainty about her future but no quick resolution. No SI Trial Cotempla 17.3 and DC Ritalin & Jornay 80  08/11/2022 appointment noted: Patient was administered Spravato 84 mg intranasally today.  The patient experienced the typical dissociation which gradually resolved over the 2-hour period of observation.  There were no complications.  Specifically the patient did not have nausea or vomiting or headache.  Specifically HA is less  of a problem with Spravato now vs when started it.. Other psych meds: Clonazepam 0.5 mg tablets 2 nightly, gabapentin dosage varies, Dayvigo 10 mg nightly, trazodone 100 mg nightly, cotempla 17.3, switch from Lexapro 20 to sertaline 15o mg . No noticeable effects sertraline from for depression but has seen some antianxiety effects from the sertraline.  No side effects noted.  No side effects with the other medicines.  Still is only getting very brief benefit 3 to 4 hours from Cotempla for ADD and mood.  She wants to try the Daytrana patch as we have discussed before. No SE  08/13/22 appt: Patient was administered Spravato 84 mg intranasally today.  The patient experienced the typical dissociation which gradually resolved over the 2-hour period of observation.  There were  no complications.  Specifically the patient did not have nausea or vomiting or headache.  Specifically HA is less of a problem with Spravato now vs when started it.. Other psych meds: Clonazepam 0.5 mg tablets 2 nightly, gabapentin dosage varies, Dayvigo 10 mg nightly, trazodone 100 mg nightly, cotempla 17.3, switch from Lexapro 20 to sertaline 15o mg . No noticeable effects sertraline from for depression but has seen some antianxiety effects from the sertraline.  No side effects noted.  No side effects with the other medicines.  Still is only getting very brief benefit 3 to 4 hours from Cotempla for ADD and mood.  She wants to try the Daytrana patch as we have discussed before.  Hasn't been able to get it yet. No SE Plan.  Continue to try to get Daytrana 30 AM  08/18/22 appt noted: Patient was administered Spravato 84 mg intranasally today.  The patient experienced the typical dissociation which gradually resolved over the 2-hour period of observation.  There were no complications.  Specifically the patient did not have nausea or vomiting or headache.  Specifically HA is less of a problem with Spravato now vs when started it.. Other  psych meds: Clonazepam 0.5 mg tablets 2 nightly, gabapentin dosage varies, Dayvigo 10 mg nightly, trazodone 100 mg nightly, cotempla 17.3, switch from Lexapro 20 to sertaline 15o mg . No noticeable effects sertraline from for depression but has seen some antianxiety effects from the sertraline.  No side effects noted.  No side effects with the other medicines.  Still is only getting very brief benefit 3 to 4 hours from Cotempla for ADD and mood.  She wants to try the Daytrana patch as we have discussed before.  Hasn't been able to get it yet.  Overall depression is some better than it was.   No SE Plan.  Continue to try to get Daytrana 30 AM  08/25/22 appt : No benefit Daytrana.  No SE.  Wants further med changes and interested In retrying pramipexole.   Tolerating meds. Patient was administered Spravato 84 mg intranasally today.  The patient experienced the typical dissociation which gradually resolved over the 2-hour period of observation.  There were no complications.  Specifically the patient did not have nausea or vomiting or headache.  Specifically HA is less of a problem with Spravato now vs when started it.. Other psych meds: Clonazepam 0.5 mg tablets 2 nightly, gabapentin dosage varies, Dayvigo 10 mg nightly, trazodone 100 mg nightly, , switch from Lexapro 20 to sertaline 15o mg . Daytrana 30 AM for a few days. Struggles with depression and no SI.  Reduced interest and mood and motivation and enjoyment and socialization.  08/27/22 appt noted: Psych meds:  Clonazepam 0.5 mg tablets 2 nightly, gabapentin dosage varies, Dayvigo 10 mg nightly, trazodone 100 mg nightly,  sertaline 15o mg . Returned to [employer] AM, pramipexole  Tolerating meds. Patient was administered Spravato 84 mg intranasally today.  The patient experienced the typical dissociation which gradually resolved over the 2-hour period of observation.  There were no complications.  Specifically the patient did not have nausea or vomiting  or headache.   Reports taking cotempla bc brief mood benefit and sig focus and productivity benefit. Took 1 dose pramipexole and felt more dep.  09/01/22 appt noted: Psych meds:  Clonazepam 0.5 mg tablets 2 nightly, gabapentin dosage varies, Dayvigo 10 mg nightly, trazodone 100 mg nightly,  sertaline 15o mg . Returned to Cotempla AM, pramipexole  0.25 mg BID. Tolerating meds now.  Patient was administered Spravato 84 mg intranasally today.  The patient experienced the typical dissociation which gradually resolved over the 2-hour period of observation.  There were no complications.  Specifically the patient did not have nausea or vomiting or headache.   Willing to try the pramipexole and will continue 0.25 mg BID this week and then consider increase if tolerated.  Mood is a little better.  Still looking for further improvement.  09/03/22 appt noted: Psych meds:  Clonazepam 0.5 mg tablets 2 nightly, gabapentin dosage varies, Dayvigo 10 mg nightly, trazodone 100 mg nightly,  sertaline 15o mg . Returned to Cotempla AM, pramipexole  0.25 mg tablet 1 and 1/2 tablets twice daily BID. Tolerating meds now. Patient was administered Spravato 84 mg intranasally today.  The patient experienced the typical dissociation which gradually resolved over the 2-hour period of observation.  There were no complications.  Specifically the patient did not have nausea or vomiting or headache.   Depression may be a little better with the parmipexole and is tolerating it well so far.  Still struggling with focus and residual depression.  Tolerating meds without SE.  More hopeful and able to enjoy some things.  09/08/22 appt  Psych meds:  Clonazepam 0.5 mg tablets 2 nightly, gabapentin dosage varies, Dayvigo 10 mg nightly, trazodone 100 mg nightly,  sertaline 15o mg . Returned to Cotempla AM, pramipexole  0.5 mg BID. Tolerating meds now. Patient was administered Spravato 84 mg intranasally today.  The patient experienced the  typical dissociation which gradually resolved over the 2-hour period of observation.  There were no complications.  Specifically the patient did not have nausea or vomiting or headache.   She has seen her depression drop from a 10/10 prior to treatment with Spravato to now 4/10 With additional benefit noted when she increased the pramipexole to 0.5 mg twice daily.  She is not having side effects with this like she did in the past. Anxiety levels are also improved. She is sleeping okay with the current medications. Plan: continue pramipexole trial off-label and increase more slowly for TRD.  Increase  Gradually  to 0.75 mg BID  09/11/22 appt noted: Psych meds:  Clonazepam 0.5 mg tablets 2 nightly, gabapentin dosage varies, Dayvigo 10 mg nightly, trazodone 100 mg nightly,  sertaline 15o mg . Returned to Cotempla AM, pramipexole  0.75 mg BID. Tolerating meds now. Patient was administered Spravato 84 mg intranasally today.  The patient experienced the typical dissociation which gradually resolved over the 2-hour period of observation.  There were no complications.  Specifically the patient did not have nausea or vomiting or headache.   She has seen her depression drop from a 10/10 prior to treatment with Spravato to now 4/10 With additional benefit noted when she increased the pramipexole to 0.5 mg twice daily.  She is not having side effects with this like she did in the past. Clearly improving with the pramipexole now and tolerating it.  Satisfied with current meds but would consider increasing if it might be helpful.  09/15/22 appt noted: Psych meds:  Clonazepam 0.5 mg tablets 2 nightly, gabapentin dosage varies, Dayvigo 10 mg nightly, trazodone 100 mg nightly,  sertaline 15o mg . Returned to Cotempla AM, pramipexole  0.75 mg BID too anxious and reduced to 0.5 mg BID. Tolerating meds now. Patient was administered Spravato 84 mg intranasally today.  The patient experienced the typical dissociation  which gradually resolved over the 2-hour period of observation.  There were no complications.  Specifically the patient did not have nausea or vomiting or headache.   Trouble staying asleep longer than 6 hours.  Doesn't feel she can tolerate pramipexole 0.75 mg BID DT anxiety not experienced at  0.5 mg BID.  Otherwise tolerating meds.  Mood is better with Spravato and pramipexole.   Not sleeping as long with meds.  EMA 6 hours.  Wonders about change Disc concerns about waiting for therapy. Feels ready to reduce Spravato to weekly.    09/25/22 appt noted: Psych meds:  Clonazepam 0.5 mg tablets 2 nightly, gabapentin dosage varies, Dayvigo 10 mg nightly, trazodone 100 mg nightly,  sertaline 15o mg . Returned to Cotempla AM, pramipexole  0.75 mg BID too anxious and reduced to 0.5 mg BID. Tolerating meds now. Patient was administered Spravato 84 mg intranasally today.  The patient experienced the typical dissociation which gradually resolved over the 2-hour period of observation.  There were no complications.  Specifically the patient did not have nausea or vomiting or headache.   Trouble staying asleep longer than 6 hours.  Failed to respond to Seroquel 25 mg HS.  Gone back to [facility].  Belsomra NR. Mood ok until the last few days more dep bc it's been 10 days since last admin.  Wants to continue weekly.   No SE.  No med changes desired.  09/29/22 appt noted: Psych meds:  Clonazepam 0.5 mg tablets 2 nightly, gabapentin dosage varies, Dayvigo 10 mg nightly, trazodone 100 mg nightly,  sertaline 15o mg . Returned to Cotempla AM, pramipexole  0.75 mg BID too anxious and reduced to 0.5 mg BID. Tolerating meds now. Patient was administered Spravato 84 mg intranasally today.  The patient experienced the typical dissociation which gradually resolved over the 2-hour period of observation.  There were no complications.  Specifically the patient did not have nausea or vomiting or headache.   Trouble staying asleep  longer than 6 hours.  Plan: Continue sertaline to 200 mg daily.  It has helped anxiety but not depression.   for insomnia, clonazepam 1 mg HS. trazodone doesn't work well but helps some Continue Dayvigo 10 HS.  Failed resp Belsomra 15 and seroquel 25 She wants to continue Cotempla or Concerta bc helps mood early in day and focus  benefit lasts longer.  Have not been able to get stimulant to be consitently effective throughout the day. continue pramipexole trial off-label and reduce back to  TRD 0.5 mg BID Spravato weekly.  10/13/22 appt noted: Not sleeping as well.  Wants to increase Spravato to twice weekly bc feeling more dep close to the next sched date.  Just the day or 2 before.  .  Plan: Trial for off label TRD increase pramipexole 1 mg TID  10/20/22 appt noted: Psych meds:  Clonazepam 0.5 mg tablets 2 nightly, gabapentin dosage varies, Dayvigo 10 mg nightly, trazodone 100 mg nightly,  sertaline 15o mg . Returned to Cotempla AM, pramipexole  1 mg TID. Tolerating meds now. Patient was administered Spravato 84 mg intranasally today.  The patient experienced the typical dissociation which gradually resolved over the 2-hour period of observation.  There were no complications.  Specifically the patient did not have nausea or vomiting or headache.   Trouble staying asleep longer than 6 hours.  More dep this week and asked to get Spravato twice this week. Plan: Trial for off label TRD increase pramipexole 1 mg QID  10/22/22 appt noted:  Psych meds:  Clonazepam 0.5 mg tablets 2 nightly and another one  with EMA, gabapentin dosage varies, Belsomra 20 mg daily,  trazodone 100 mg nightly,  sertaline 15o mg . Returned to Cotempla AM, pramipexole  1 mg QID for 2nd day Tolerating meds now.  Except a little N and dizzy briefly after takes it. Patient was administered Spravato 84 mg intranasally today.  The patient experienced the typical dissociation which gradually resolved over the 2-hour period of  observation.  There were no complications.  Specifically the patient did not have nausea or vomiting or headache. No change in mood yet.   Sleep better with Belsomra 20 as long as takes with other meds.  If insomnia mood is worse. Ongoing some dep but gets partial relief with Spravato but would like better relief .  10/27/22 received Spravato 84  11/03/22 appt noted:  Meds as above except sertraline she cut to 100 mg instead of 200 mg daily She doesn't think sertraline helping and worries over poss SE Off and on Cotempla .  Not affecting BP. Doesn't notice benefit or SE with pramipexole 1 mg QID.  No SE Sleep is OK.  Some awakening aftter 6 hours but not enough so takes another clonazepam.   Patient was administered Spravato 84 mg intranasally today.  The patient experienced the typical dissociation which gradually resolved over the 2-hour period of observation.  There were no complications.  Specifically the patient did not have nausea or vomiting or headache. No change in mood yet.   Sleep better with Belsomra 20 as long as takes with other meds.  If insomnia mood is worse. Ongoing some dep but gets partial relief with Spravato but would like better relief .  No sig benefit with pramipexole 1 mg QID.  11/21/22 TC:  To ER with dep 11/21/22. No SE with selegiline but feels more dep.  Wants to stop it. Today is only day 2 of 1 tablet of selegiline 5 mg AM.  Reviewed with her that she had not been satisfied with the response of Spravato and Zoloft and that she is likely feeling worse because the Zoloft is wearing off and in particular because she abruptly stopped 150 mg daily.  She needs to give the selegiline time to work.  She agrees to do so.  She admitted to some passive suicidal thoughts but no active suicidal thoughts and no desire to die.  She agrees to the plan to increase selegiline to 10 mg every morning this weekend and to continue the selegiline trial.  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  She is aware.  Heather Staggers, MD, DFAPA  12/19/22 TC: Pt called at 1:42p.  She said she wanted to know if she could start weaning off the Selegiline.  She thinks increasing the dosage is making things worse.  She said she wants to go back to [employer].  She acknowleged she has an appt on Monday. I advised her that I wasn't sure Dr Jennelle Human will see her message before her appt, but she still ask me to send the message.  Next appt 8/26 MD resp.  No change until Monday appt.  12/22/22 appt noted: Meds: selegiline up to 5 mg 4 daily for a week but felt it incr irritability and went back to 3 mg daily. She wants to go back to Spravato.  Not near as dep as now.  Selegiline didn't help energy. Still on Belsomra Thinks maybe sertraline helped mood more than she thought. More dep than anxiety.  Plan: DC selegiline and will retry Effexor which helped in the past at 300 mg daily.  Start this Friday after over 10 half lives of selegiline.    12/24/22 appt noted: Stopped selegiline  Psych med: belsomra 20, clonazepam 1 mg HS.   She has felt more dep off the Spravato though at the time didn't think it was helping.  Has felt more down and withdrawn.  Low energy, motivation, not smiling.  No SI.  Sleep ok.   Patient was administered Spravato 84 mg intranasally today.  The patient experienced the typical dissociation which gradually resolved over the 2-hour period of observation.  There were no complications.  Specifically the patient did not have nausea or vomiting or headache. No problems with BP or other unusual SE Mood is a little better after this administration of Spravato.  Very motivated to continue it.  No immediate med concerns. No SI .  Ongoing anhedonia.  12/25/22 appt noted: Psych med: belsomra 20, clonazepam 1 mg HS.  Not resumed stimulant or AD yet   No SE. Patient was administered Spravato 84 mg intranasally today.  The patient experienced  the typical dissociation which gradually resolved over the 2-hour period of observation.  There were no complications.  Specifically the patient did not have nausea or vomiting or headache.  There is a lot things going on in her life and it is somewhat difficult to separate stressors from what is going on with her mood.  However she is happy to resume Spravato as she decided after stopping briefly that it was helpful.  She plans to start the antidepressant Morrow.  She also wants to resume the stimulant tomorrow.  12/30/22 appt noted; Psych med: belsomra 20, clonazepam 1 mg HS.  Not resumed stimulant.  Effexor XR 37.5 mg daily   No SE. Patient was administered Spravato 84 mg intranasally today.  The patient experienced the typical dissociation which gradually resolved over the 2-hour period of observation.  There were no complications.  Specifically the patient did not have nausea or vomiting or headache.  She is still having depression with very prominent anhedonia.  However she feels the Spravato is somewhat helpful and is hopeful for more complete response as she gets back onto a reasonable antidepressant dose.  She tolerated Effexor XR 300 mg daily in the past and took it for 5 months and it is probably the antidepressants worked best for her.  She is agreeable with this plan.  She is getting sleep benefit from the current medications Plan: incr Effexor 75 mg daily  01/02/23 appt noted: Psych meds as noted above except increase Effexor XR to 75 mg daily without side effects. Patient was administered Spravato 84 mg intranasally today.  The patient experienced the typical dissociation which gradually resolved over the 2-hour period of observation.  There were no complications.  Specifically the patient did not have nausea or vomiting or headache.  Agrees to increasing Effexor as planned.  SX not changed from that noted above.  No SI  01/05/23 appt noted: Psych meds as noted above except increase Effexor  XR to 112.5 mg daily without side effects. Patient was administered Spravato 84 mg intranasally today.  The patient experienced the typical dissociation which gradually resolved over the 2-hour period of observation.  There were no complications.  Specifically the patient did not have nausea or vomiting or headache.  Agrees to increasing Effexor as planned gradually to 300 mg daily.  SX not changed from that noted above.  Does however feel Spravato has dampened the degree of depression.  No SI Stressful life event discussed and won't be resolved until early next year.  01/07/23 appt noted: Psych meds Effexor XR 112.5 mg daily. Patient was administered Spravato 84 mg intranasally today.  The patient experienced the typical dissociation which gradually resolved over the 2-hour period of observation.  There were no complications.  Specifically the patient did not have nausea or vomiting or headache.  Agrees to increasing Effexor as planned gradually to 300 mg daily.  SX not changed from that noted above.  Does however feel Spravato has dampened the degree of depression markedly compared to when off psych meds and no Spravato.  No SI  01/12/23 appt noted; Psych meds Effexor XR 225 mg daily. No SE notedl. Patient was administered Spravato 84 mg intranasally today.  The patient experienced the typical dissociation which gradually resolved over the 2-hour period of observation.  There were no complications.  Specifically the patient did not have nausea or vomiting.  But is having headache.  Agrees to increasing Effexor as planned gradually to 300 mg daily.  SX not changed from that noted above.  Does however feel Spravato has dampened the degree of depression markedly compared to when off psych meds and no Spravato.  No SI  01/15/23 appt noted:  Psych meds Effexor XR 225 mg daily. No SE noted. Patient was administered Spravato 84 mg intranasally today.  The patient experienced the typical dissociation which  gradually resolved over the 2-hour period of observation.  There were no complications.  Specifically the patient did not have nausea or vomiting.   Doing ok with higher doses of Effexor and will plan to increase to 300 mg daily. Mild improvement noticed so far with the incrase in meds.   Uses Cotempla prn and it helps but doesn't liek the way she feels when it wears off.  01/22/23 appt noted: Psych meds reduced Effexor XR 150 mg daily. Bc of HA No SE noted. Patient was administered Spravato 84 mg intranasally today.  The patient experienced the typical dissociation which gradually resolved over the 2-hour period of observation.  There were no complications.  Specifically the patient did not have nausea or vomiting.  Ongoing moderate levels of depression with some improvement from the Spravato.  She is not sure about the effect of the venlafaxine on her mood yet.  She would like to continue to try pursuing a stimulant but has had difficulty tolerating p.o. versions of stimulants because of the crash at the end and side effects with many of them.  She has several questions about this.  01/27/23 appt noted: Psych meds: Clonazepam 1 to 1.5 mg nightly, lithium CR 300 nightly, Belsomra 20 nightly, venlafaxine XR 150 every morning No side effects noted at the current dosage. Patient was administered Spravato 84 mg intranasally today.  The patient experienced the typical dissociation which gradually resolved over the 2-hour period of observation.  There were no complications.  Specifically the patient did not have nausea or vomiting.  Ongoing moderate levels of depression with some improvement from the Spravato.   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  She does not tolerate oral stimulants very well because she has a crash as they wear off which is very dysphoric.  She is willing to retry the Daytrana patches that she took in the spring.   She took 1 patch on 1 occasion recently and  felt it was helpful. However she is also having problems with early morning awakening over the last week without precipitant.  She would like to have a different treatment strategy for sleep but understands her sleep problems have historically been difficult to resolve and maintain.  02/10/23 appt noted: Psych meds: Clonazepam 1 to 1.5 mg nightly, lithium CR 300 nightly, Belsomra 20 nightly, venlafaxine XR 150 every morning, Daytrana 30 mg Am No side effects noted at the current dosage. Patient was administered Spravato 84 mg intranasally today.  The patient experienced the typical dissociation which gradually resolved over the 2-hour period of observation.  There were no complications.  Specifically the patient did not have nausea or vomiting.  Ongoing moderate levels of depression with some improvement from the Spravato.  Particularly anhedonia.  No sig benefit noted with Daytrana and problems with table stimulants bc gets brief benefit with unpleasant "crash".  Wants to try another type of patch if available.  02/17/23 appt noted: Psych meds: Clonazepam 1 to 1.5 mg nightly, lithium CR 300 nightly, Belsomra 20 nightly, venlafaxine XR 225 mg every morning, Daytrana 30 mg Am No side effects noted at the current dosage. Patient was administered Spravato 84 mg intranasally today.  The patient experienced the typical dissociation which gradually resolved over the 2-hour period of observation.  There were no complications.  Specifically the patient did not have nausea or vomiting.  Ongoing moderate levels of depression with some improvement from the Spravato.  Particularly anhedonia.  She has not gotten the new stimulant yet but is hopeful. No problems with increasing venlafaxine.   Mood is better with Spravato and venlafaxine.  Less anhedonia, less down. Themazepam not helpful.    03/04/23 received Spravato 84 03/12/23 received Spravato 84  03/19/23 appt  noted: Psych meds: Clonazepam 1 to 1.5 mg nightly, lithium CR 300 nightly, Belsomra 20 nightly, venlafaxine XR 300 mg every morning, tried various stimulants lately No side effects noted at the current dosage. Patient was administered Spravato 84 mg intranasally today.  The patient experienced the typical dissociation which gradually resolved over the 2-hour period of observation.  There were no complications.  Specifically the patient did not have nausea or vomiting.  Not getting good duration from oral stimulants.  She would like to try another patch but has to take tabs first and try them before insurance will cover patches.  Overall dep is better with meds and Spravato.  Clearly sees benefit.  03/30/23 appt noted: Psych meds: Clonazepam 1 to 1.5 mg nightly, lithium CR 300 nightly,  venlafaxine XR 300 mg every morning, tried various stimulants lately, gabapentin 900 mg HS, dexmethylphenidate ER without help No side effects noted at the current dosage. Patient was administered Spravato 84 mg intranasally today.  The patient experienced the typical dissociation which gradually resolved over the 2-hour period of observation.  There were no complications.  Specifically the patient did not have nausea or vomiting.  Able to leave at end of 2 hour period without assistance.  Clear mood benefit with Spravato with much less dep and wants to continue. Still trouble sleeping. TR insomnia.  Ongoing conc problems with minimal brief benefit from stimulants.  Not able to get patch Caprice Red yet DT insurance.  04/14/23 appt noted: Psych meds: Clonazepam 1 to 1.5 mg nightly, lithium CR 300 nightly,  she reduced on her own venlafaxine XR to 150 mg every morning, tried various stimulants lately, gabapentin 900 mg HS, dexmethylphenidate ER without help No side effects noted at the  current dosage. Patient was administered Spravato 84 mg intranasally today.  The patient experienced the typical dissociation which  gradually resolved over the 2-hour period of observation.  There were no complications.  Specifically the patient did not have nausea or vomiting.  Able to leave at end of 2 hour period without assistance.  Clear mood benefit with Spravato with much less dep and wants to continue. she went to [facility] and had her brain scanned at [facility]. they recommend she taper off Effexor which she is only taking 150 mg currently and start Auvelity. She reduce Effexor on her own to 150 mg daily. She wants further improvement in anhedonia.    04/28/23 received Spravato 84  05/11/23 appt noted: Psych meds: Clonazepam 1 to 1.5 mg nightly, lithium CR 300 nightly,  venlafaxine XR to 150 mg every morning, Auvelity BID, tried various stimulants lately, gabapentin 900 mg HS, dexmethylphenidate ER without help No side effects noted at the current dosage. Patient was administered Spravato 84 mg intranasally today.  The patient experienced the typical dissociation which gradually resolved over the 2-hour period of observation.  There were no complications.  Specifically the patient did not have nausea or vomiting.  Able to leave at end of 2 hour period without assistance.  Clear mood benefit with Spravato with much less dep and wants to continue. Overall her mood is still significantly better with the combination of Spravato venlafaxine and Auvelity in particular.  She has not had any problems from the reduction in venlafaxine but is not dramatically improved with regard to residual depression since adding Auvelity but she is not having side effects.  We agreed that a longer trial of the helpful.  She is also wondering about increasing the Spravato frequency from once every other week to once every 10 days in order to prove improved residual depression and specifically anhedonia.  Overall though she is dramatically better with the current combination of meds. Plan: continue venlafaxine ER 150 with Auvelity BID bc  TRD  05/25/23 appt noted: Psych meds: Clonazepam 1 to 1.5 mg nightly, lithium CR 300 nightly,  venlafaxine XR to 150 mg every morning, Auvelity BID, tried various stimulants lately, gabapentin 900 mg HS, dexmethylphenidate ER without help No side effects noted at the current dosage. Patient was administered Spravato 84 mg intranasally today.  The patient experienced the typical dissociation which gradually resolved over the 2-hour period of observation.  There were no complications.  Specifically the patient did not have nausea or vomiting.  Able to leave at end of 2 hour period without assistance.  Clear mood benefit with Spravato with much less dep and wants to continue.  06/04/23 appt noted: Psych meds: Clonazepam 1 to 1.5 mg nightly, lithium CR 300 nightly,  venlafaxine XR to 150 mg every morning, Auvelity 1 in the AM, tried various stimulants lately, gabapentin 900 mg HS, no current stimulant (Vyvanse 40 NR) She reduced Auvelity to 1 daily DT tremor she thinks it made worse and bc no benefit noted with it. She is interested in increasing the Effexor at 300 mg daily bc did get some benefit from it in the past and tolerated that dos. Chronic dep is better with Spravato but not gone.  Some anhedonia.  Focus is still not good. Problems getting patch ADD med. Patient was administered Spravato 84 mg intranasally today.  The patient experienced the typical dissociation which gradually resolved over the 2-hour period of observation.  There were no complications.  Specifically the patient did  not have nausea or vomiting.  Able to leave at end of 2 hour period without assistance.  Clear mood benefit with Spravato with much less dep and wants to continue.  06/16/23 appt noted: Psych meds: Clonazepam 1 to 1.5 mg nightly, lithium CR 300 nightly,  venlafaxine XR didn't yet increase so  225 mg every morning, , tried various stimulants lately, gabapentin 900 mg HS, no current stimulant (Vyvanse 40 NR), trazodone 50  HS Chronic dep is better with Spravato but not gone.  Some anhedonia.  Focus is still not good. Problems getting patch ADD med. Patient was administered Spravato 84 mg intranasally today.  The patient experienced the typical dissociation which gradually resolved over the 2-hour period of observation.  There were no complications.  Specifically the patient did not have nausea or vomiting.  Able to leave at end of 2 hour period without assistance.  Clear mood benefit with Spravato with much less dep and wants to continue. She has not increased venlafaxine to 300 mg yet but is eager to do so.  She still has some sleep issues and wonders if trazodone is working.  She has no side effect with current medications.  She would like to try an alternative med for ADD since the stimulants have not been effective.  AIMS    Flowsheet Row Video Visit from 01/03/2022 in [facility]  AIMS Total Score 0      ECT-MADRS    Flowsheet Row Clinical Support from 08/13/2022 in [facility] Video Visit from 04/29/2022 in [facility] Video Visit from 02/06/2022 in [facility]  MADRS Total Score 27 44 23      GAD-7    Flowsheet Row Counselor from 12/19/2022 in [facility] Office Visit from 08/22/2022 in [facility] [place]  Total GAD-7 Score 4 7      PHQ2-9    Flowsheet Row Counselor from 12/19/2022 in [facility] Office Visit from 08/22/2022 in [facility] [place] Office Visit from 08/19/2022 in [facility] at [facility] Video Visit from 02/06/2022 in [facility] Office Visit from 11/16/2020 in [facility] for [facility] at [facility] Total Score 2 3 0 6 0  PHQ-9 Total Score 5 5 -- 14 --        Past  Psychiatric Medication Trials: Trintellix - Initially effective and then was not effective when re-started Paxil- Ineffective. Helped initially. Prozac-Insomnia Lexapro- Effective and then no longer as effective Zoloft- Initially effective and then minimal response Viibryd Cymbalta- Ineffective. Helped initially. Effexor XR 300- Effective, well tolerated. Pristiq- Increased anxiety at 150 mg daily Wellbutrin XL-Ineffective.May have increased anxiety. Amitriptyline Nortriptyline-Caused irritability, constipation Auvelity- ineffective 2 trials Selegilne brief 20 mg daily , more irritable  Abilify- Helpful but caused severe insomnia. Rexulti-Helpful but caused insomnia. Vraylar Seroquel - Ineffective Risperdal Olanzapine- Ineffective Latuda- Ineffective Caplyta  Klonopin- Effective Temazepam- Took during menopause Lunesta- Ineffective Ambien-Ineffective Sonata- Ineffective Dayvigo- partially effective  Belsomra 20 worked and then failed  Trazodone-  somewhat effective Remeron-Ineffective. Does not recall wt gain.  Quetiapine 25 NR  Adderall- Took once and had adverse effect. Concerta- Effective but only 4-6 hours Jornay 80 NR Metadate-Not as effective as Concerta Dexmethylphenidate IR NR Dexmethylphenidate ER 10/02/21- Not as effective as Concerta Azstarys- some side effects. Not as effective.  Ritalin short acting and  SE anxiety & HTN @20  TID Cotempla 17.3  Daytrana NR Vyvanse 40 NR  Lamictal- May have had an adverse effects. "Felt weird." Reports worsening s/s.  Lithium ? effect Gabapentin Pramipexole- 1 mg QID NR  ECT #20 with minimal response. H says it was partly helpful.  AIMS    Flowsheet Row Video Visit from 01/03/2022 in [facility]  AIMS Total Score 0      ECT-MADRS    Flowsheet Row Clinical Support from 08/13/2022 in [facility] Video Visit from 04/29/2022 in [facility] Video Visit from 02/06/2022 in [facility]  MADRS Total Score 27 44 23      GAD-7    Flowsheet Row Counselor from 12/19/2022 in [facility] Office Visit from 08/22/2022 in [facility] [place]  Total GAD-7 Score 4 7      PHQ2-9    Flowsheet Row Counselor from 12/19/2022 in [facility] Office Visit from 08/22/2022 in [facility] [place] Office Visit from 08/19/2022 in [facility] at [facility] Video Visit from 02/06/2022 in [facility] Office Visit from 11/16/2020 in [facility] for [facility] Healthcare at [facility] Total Score 2 3 0 6 0  PHQ-9 Total Score 5 5 -- 14 --        Review of Systems:  Review of Systems  Constitutional:  Positive for fatigue.  Cardiovascular:  Negative for palpitations.  Neurological:  Positive for tremors. Negative for dizziness and headaches.  Psychiatric/Behavioral:  Positive for decreased concentration, dysphoric mood and sleep disturbance. Negative for agitation, behavioral problems, confusion, hallucinations and suicidal ideas. The patient is nervous/anxious.     Medications: I have reviewed the patient's current medications.  Current Outpatient Medications  Medication Sig Dispense Refill   clonazePAM (KLONOPIN) 1 MG tablet Take 1-2 tablets (1-2 mg total) by mouth at bedtime. 60 tablet 0   Esketamine HCl, 84 MG Dose, (SPRAVATO, 84 MG DOSE,) 28 MG/DEVICE SOPK USE 3 SPRAYS IN EACH NOSTRIL ONCE A WEEK 3 each 1   Estradiol (VAGIFEM) 10 MCG TABS vaginal tablet Place 1 tablet (10 mcg total) vaginally 2 (two) times a week. 24 tablet 3   gabapentin (NEURONTIN) 300 MG capsule 2 at bedtime and 1-2 capsule if need for early morning awakening. 120 capsule 1   lithium carbonate (LITHOBID) 300 MG ER tablet TAKE 2 TABLETS BY MOUTH AT BEDTIME 60  tablet 0   traZODone (DESYREL) 50 MG tablet TAKE 1 TABLET BY MOUTH AT BEDTIME 30 tablet 0   valACYclovir (VALTREX) 500 MG tablet Take one twice daily for 3 days with any symptoms 30 tablet 3   valsartan (DIOVAN) 80 MG tablet Take 1 tablet (80 mg total) by mouth daily. 90 tablet 3   venlafaxine XR (EFFEXOR-XR) 75 MG 24 hr capsule Take 3 capsules (225 mg total) by mouth daily with breakfast. 90 capsule 1   Dextroamphetamine (XELSTRYM) 13.5 MG/9HR PTCH Place 1 patch onto the skin every morning. (Patient not taking: Reported on 05/25/2023) 30 patch 0   lisdexamfetamine (VYVANSE) 40 MG capsule Take 1 capsule (40 mg total) by mouth every morning. (Patient not taking: Reported on 06/16/2023) 30 capsule 0   No current facility-administered medications for this visit.    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   TRANSAMINASES, SERUM, ELEVATED 08/14/2009    Past Medical History, Surgical history, Social history, and Family history were reviewed and updated as appropriate.   Please see review of systems for further details on the patient's review from today.   Objective:   Physical Exam:  LMP 12/27/2007 (Exact Date)   Physical Exam Constitutional:      General: She is not in acute distress. Musculoskeletal:        General: No deformity.  Neurological:     Mental Status: She is alert and oriented to person, place, and time.     Motor: Tremor present.     Coordination: Coordination normal.     Comments: Minimal tremor  Psychiatric:        Attention and Perception: Perception normal. She is inattentive. She does not perceive auditory  hallucinations.        Mood and Affect: Mood is anxious and depressed. Affect is blunt. Affect is not tearful.        Speech: Speech normal. Speech is not rapid and pressured or slurred.        Behavior: Behavior normal.        Thought Content: Thought content normal. Thought content is not delusional. Thought content does not include homicidal or suicidal ideation. Thought content does not include suicidal plan.        Cognition and Memory: Cognition and memory normal.        Judgment: Judgment normal.     Comments: Insight intact 4/10 dep but better with Spravato markedly. Affect blunted chronically    Lab Review:     Component Value Date/Time   NA 132 (L) 10/27/2022 1107   K 4.4 10/27/2022 1107   CL 95 (L) 10/27/2022 1107   CO2 30 10/27/2022 1107   GLUCOSE 89 10/27/2022 1107   BUN 14 10/27/2022 1107   CREATININE 0.67 10/27/2022 1107   CREATININE 0.68 01/26/2020 0920   CALCIUM 10.1 10/27/2022 1107   PROT 8.3 04/22/2022 0932   ALBUMIN 5.3 (H) 04/22/2022 0932   AST 82 (H) 04/22/2022 0932   ALT 118 (H) 04/22/2022 0932   ALKPHOS 74 04/22/2022 0932   BILITOT 0.3 04/22/2022 0932   GFRNONAA >90 05/01/2014 1645   GFRAA >90 05/01/2014 1645       Component Value Date/Time   WBC 3.3 (L) 04/22/2022 0932   RBC 4.25 04/22/2022 0932   HGB 14.3 04/22/2022 0932   HGB 14.1 10/26/2014 1034   HCT 40.7 04/22/2022 0932   PLT 245.0 04/22/2022 0932   MCV 95.7 04/22/2022 0932   MCH 34.4 (H) 01/26/2020 0920   MCHC 35.1 04/22/2022 0932   RDW 13.2 04/22/2022 0932   LYMPHSABS 0.9 04/22/2022 0932   MONOABS 0.2 04/22/2022 0932   EOSABS 0.1 04/22/2022 0932   BASOSABS 0.0 04/22/2022 0932    No results found for: "POCLITH", "LITHIUM"   No results found for: "PHENYTOIN", "PHENOBARB", "VALPROATE", "CBMZ"   .res Assessment: Plan:    Haruka was seen today for follow-up, depression, add, sleeping problem and medication reaction.  Diagnoses and all orders for this visit:  Recurrent major  depression resistant to treatment (HCC)  Generalized anxiety disorder  Attention deficit hyperactivity disorder (ADHD), predominantly inattentive type  Insomnia, unspecified type    Pt seen for TRD with multiple failed medications as noted above.    Mood was reportedly better with venlafaxine with Spravato but ongoing dep with anhedonia and fatigue.    There is an ongoing pattern of her making impulsive decisions on her own about meds which are counter therapeutic and not informing MD of what is going on until after th fact.  That is not a helpful pattern and she is encouraged to stop it.  Patient was administered Spravato 84 mg intranasally today.  The patient experienced the typical dissociation which gradually resolved over the 2-hour period of observation.  There were no complications.  Specifically the patient did not have nausea or vomiting or headache.  Blood pressures remained within normal ranges at the 40-minute and 2-hour follow-up intervals.  By the time the 2-hour observation period was met the patient was alert and oriented and able to exit without assistance.  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  See nursing note for further details. Would like to increase frequency from every other week to every 10 days looking for further improvement in depression.  Consider switch Spravato to MAOI, Parnate  We discussed the short-term risks associated with benzodiazepines including sedation and increased fall risk among others.  Discussed long-term side effect risk including dependence, potential withdrawal symptoms, and the potential eventual dose-related risk of dementia.  But recent studies from 2020 dispute this association between benzodiazepines and dementia risk. Newer studies in 2020 do not support an association with dementia.  for insomnia, wants to continue clonazepam 2 mg  nightly. Can't sleep without it. Wants to continue trazodone also  Reports needing gabapentin [phone] mg HS to help sleep.  Cotempla or Concerta bc helps mood early in day and focus  benefit lasts longer.  Have not been able to get stimulant to be consitently effective throughout the day. Conisder switching to patch bc pt has significant crash with stimulants or perhaps [place].  Stimulant tablets cause crash with inadequate duration .  Patches should provide smoother response.  Will try another patch, Noemi Chapel if we can get it approved.   Insurance denied [place] for this medication stating she has to try dextroamphetamine ER, Adderall XR, and Vyvanse before will be considered. Have been reluctant to try Adderall XR because she did not tolerate Adderall at home.     Discussed potential benefits, risks, and side effects of stimulants with patient to include increased heart rate, palpitations, insomnia, increased anxiety, increased irritability, or decreased appetite.  Instructed patient to contact office if experiencing any significant tolerability issues.  To max opportunity to respond for tRD, lithium 300 mg PM.  Disc pretreating Spravato with NSAID or Tylenol bc of HA lately.  Supportive therapy dealing with serious stressful life event.  Plan: continue Clonazepam 1 to 1.5 mg nightly, lithium CR 300 nightly, increase venlafaxine XR to 300 mg every morning,  tried various stimulants lately, gabapentin 900 mg HS, trazodone 50 HS.  Consider Qelbree for treatment resistant ADD but first need to make sure she tolerates the venlafaxine because of potential interactions.  FU every 7-10 days  Heather Staggers, MD, DFAPA    Please see After Visit Summary for patient specific instructions.  Future Appointments  Date Time Provider Department Center  06/29/2023  9:00 AM Cottle, Steva Ready., MD [facility] None  06/29/2023  9:00 AM CP-NURSE [facility] None  07/13/2023  9:00 AM Cottle, Steva Ready., MD [facility] None   07/13/2023  9:00 AM CP-NURSE [facility] None  08/21/2023  8:00 AM Burchette, Elberta Fortis, MD [facility]  09/18/2023  8:15 AM Jerene Bears, MD [facility]    No orders of the defined types were placed in this encounter.   -------------------------------

## 2023-06-16 NOTE — Progress Notes (Signed)
 NURSES NOTE:           Patient arrived for her #100 Spravato treatment. Pt is coming every other week now. Pt is being treated for Treatment Resistant Depression, pt will be receiving 84 mg (3 of the 28 mg) nasal sprays, this is her maintenance dose. Patient taken to treatment room, I explained and discussed her treatment and how the schedule should go, along with any side effects that may occur. Answered any questions and concerns the patient had. Pt's Spravato is delivered through [place] and she is now billing with her insurance. Spravato medication is stored at doctors office per REMS/FDA guidelines. The medication is required to be locked behind two doors per FDA/REMS Protocol. Medication is also disposed of properly per regulations. All treatments and her vital signs are documented in Spravato REMS per protocol of treatment center regulations. [facility] delivers her medication.      Vital Signs assessed at 8:05 AM 107/65, pulse 68 pulse ox 92%. Instructed pt to blow her nose and recline back slightly to prevent any of medication dripping out of her nose.   Pt. Given 1st dose (28 mg inhaler), then 5 minutes between 2nd dose and 3rd dose for total of 84 mg. No complaints of nausea/vomiting reported. Pt did listen to music today, she just reclined back and closed her eyes. After 40 minutes I went to assess her vital signs, no side effects or symptoms reported, B/P 106/69, pulse 73.  Dr. Jennelle Human talked to pt today about her treatment. Nurse was with pt a total of 60 minutes but pt observed for 120 minutes per protocol. Discharge vital signs were at 9:56 AM, B/P 116/73, pulse 62. She verbalized understanding. She reports dissociation but she was clear upon her discharge. She is instructed to contact office if needing anything prior to her next treatment.  She is scheduled on March 3rd.   LOT # [id number]   EXP Y [id number]

## 2023-06-22 ENCOUNTER — Other Ambulatory Visit: Payer: Self-pay | Admitting: Psychiatry

## 2023-06-22 DIAGNOSIS — F339 Major depressive disorder, recurrent, unspecified: Secondary | ICD-10-CM

## 2023-06-23 ENCOUNTER — Ambulatory Visit: Payer: 59 | Admitting: Professional Counselor

## 2023-06-29 ENCOUNTER — Ambulatory Visit: Payer: 59

## 2023-06-29 ENCOUNTER — Ambulatory Visit: Payer: 59 | Admitting: Psychiatry

## 2023-06-29 ENCOUNTER — Encounter: Payer: Self-pay | Admitting: Psychiatry

## 2023-06-29 VITALS — BP 124/82 | HR 76

## 2023-06-29 DIAGNOSIS — F411 Generalized anxiety disorder: Secondary | ICD-10-CM | POA: Diagnosis not present

## 2023-06-29 DIAGNOSIS — G4700 Insomnia, unspecified: Secondary | ICD-10-CM | POA: Diagnosis not present

## 2023-06-29 DIAGNOSIS — F339 Major depressive disorder, recurrent, unspecified: Secondary | ICD-10-CM | POA: Diagnosis not present

## 2023-06-29 DIAGNOSIS — F900 Attention-deficit hyperactivity disorder, predominantly inattentive type: Secondary | ICD-10-CM | POA: Diagnosis not present

## 2023-06-29 MED ORDER — ATOMOXETINE HCL 25 MG PO CAPS: ORAL_CAPSULE | ORAL | 0 refills | Status: DC

## 2023-06-29 NOTE — Progress Notes (Signed)
 Heather Davila [id number] [birth year] 63 y.o.  Subjective:   Patient ID:  Heather Davila is a 63 y.o. (DOB [birth year]) female.  Chief Complaint:  Chief Complaint  Patient presents with   Follow-up   Depression   Anxiety   ADD   Sleeping Problem    HPI Heather Davila presents to the office today for follow-up of treatment resistant recurrent depression, generalized anxiety disorder and insomnia.  Almost too numerous to count med failures. She is here for Spravato administration for her treatment resistant depression.  05/21/22 appt noted: Patient was administered first dose Spravato 56 mg intranasally today.  The patient experienced the typical dissociation which gradually resolved over the 2-hour period of observation.  There were no complications.  Specifically the patient did not have nausea or vomiting or headache.   Dissociation was mild. Did not experience any emotional relief from disabling depression.wants to increase Spravato to the usual dose.  She is aware insurance is not covering the costs at this time.  No SI acutely. Tolerating meds.    05/23/22 appt noted: Patient was administered Spravato 84 mg intranasally today.  The patient experienced the typical dissociation which gradually resolved over the 2-hour period of observation.  There were no complications.  Specifically the patient did not have nausea or vomiting.   Dissociation was greater than last dose and a little bothersome DT some HA. Tolerating meds.   Mood has not changed significantly thus far with Spravato.  05/27/22 appt noted: Patient was administered Spravato 84 mg intranasally today.  The patient experienced the typical dissociation which gradually resolved over the 2-hour period of observation.  There were no complications.  Specifically the patient did not have nausea or vomiting or headache. Today she has felt a little relief from the pressing heaviness of depression but a little more anxiety.  During  administration of Spravato she listens to [place] music and was able to relax a little more with the feeling of dissociation that was mildly bothersome. Husband was also in on the session today and asked some questions about IV ketamine versus nasal spray ketamine.  05/29/22 appt noted: Cancelled today DT hypertension  05/30/22 appt noted: Patient was administered Spravato 84 mg intranasally today.  The patient experienced the typical dissociation which gradually resolved over the 2-hour period of observation.  There were no complications.  Specifically the patient did not have nausea or vomiting or headache. She is seeing some improvement in mood with less continuous depression and less intensity.  Hopefulness is better.   No med complaints or SE  06/05/22 appt noted: Patient was administered Spravato 84 mg intranasally today.  The patient experienced the typical dissociation which gradually resolved over the 2-hour period of observation.  There were no complications.  Specifically the patient did not have nausea or vomiting or headache.  Specifically HA is less of a problem with Spravato. No SE with meds. Depression is improving with less intensity.  Intermittent anxiety easily.  More able to enjoy things.  No new concerns.  06/17/22 appt noted: Patient was administered Spravato 84 mg intranasally today.  The patient experienced the typical dissociation which gradually resolved over the 2-hour period of observation.  There were no complications.  Specifically the patient did not have nausea or vomiting or headache.  Specifically HA is less of a problem with Spravato. No SE with meds. Depression is improved 45% with less intensity.  Intermittent anxiety less easily.  More able to enjoy things.  No new concerns.  More  active. Current meds: increased clonazepam to about 1.5 mg HS DT recent insomnia, lexapro 20, Dayvigo 10 mg HS, concerta 36 mg AM, trazodone 100 mg HS.  06/20/22 appt noted: Current  meds: increased clonazepam to about 1.5 mg HS DT recent insomnia, lexapro 20, Dayvigo 10 mg HS, concerta 36 mg AM, trazodone 100 mg HS. Patient was administered Spravato 84 mg intranasally today.  The patient experienced the typical dissociation which gradually resolved over the 2-hour period of observation.  There were no complications.  Specifically the patient did not have nausea or vomiting or headache.  Specifically HA is less of a problem with Spravato. No SE with meds. Depression is improved 45% with less intensity.  Intermittent anxiety less easily.  More able to enjoy things.  No new concerns.  More active.  06/23/22 appt noted:  Current meds: increased clonazepam to about 1.5 mg HS DT recent insomnia, lexapro 20, Dayvigo 10 mg HS, concerta 36 mg AM, trazodone 100 mg HS. Patient was administered Spravato 84 mg intranasally today.  The patient experienced the typical dissociation which gradually resolved over the 2-hour period of observation.  There were no complications.  Specifically the patient did not have nausea or vomiting or headache.  Specifically HA is less of a problem with Spravato. No SE with meds. Intensity of Spravato dissociation varies.  Last week very intesne and this week more typical.  Depression is continuing to improve with better interest and activity and motivation.  Gradual improvement.  Wants to continue.  06/25/22 appt noted: Current meds: increased clonazepam to about 1.5 mg HS DT recent insomnia, lexapro 20, Dayvigo 10 mg HS, concerta 36 mg AM, trazodone 100 mg HS. Patient was administered Spravato 84 mg intranasally today.  The patient experienced the typical dissociation which gradually resolved over the 2-hour period of observation.  There were no complications.  Specifically the patient did not have nausea or vomiting or headache.  Specifically HA is less of a problem with Spravato. No SE with meds. Intensity of Spravato dissociation varies.  No scary and well  tolerated. Continues to gradually improve with Spravato.  She is more motivated and enjoying things more.  H sees progress.  Wants to continue twice weekly until gets maximum response.  Dep is not gone yet.  06/30/22 appt noted:  seen with H Current meds: increased clonazepam to about 1.5 mg HS DT recent insomnia, lexapro 20, Dayvigo 10 mg HS, concerta 36 mg AM, trazodone 100 mg HS. Patient was administered Spravato 84 mg intranasally today.  The patient experienced the typical dissociation which gradually resolved over the 2-hour period of observation.  There were no complications.  Specifically the patient did not have nausea or vomiting or headache.  Specifically HA is less of a problem with Spravato. No SE with meds. Intensity of Spravato dissociation varies.  No scary and well tolerated. Continues to gradually improve with Spravato.  She is more motivated and enjoying things more.  H sees even more progress than she does; more active and smiling.  Wants to continue twice weekly until gets maximum response.  Dep is 80% better.  07/07/22 appt noted: Current meds: increased clonazepam to about 1.5 mg HS DT recent insomnia, lexapro 20, Dayvigo 10 mg HS, concerta 36 mg AM, trazodone 100 mg HS. Patient was administered Spravato 84 mg intranasally today.  The patient experienced the typical dissociation which gradually resolved over the 2-hour period of observation.  There were no complications.  Specifically the patient did not have  nausea or vomiting or headache.  Specifically HA is less of a problem with Spravato. No SE with meds. Intensity of Spravato dissociation varies.  No scary and well tolerated.  Was more intese today. Overall mood is markedly better and please with meds. No changes desired.  07/09/22 appt noted: In transition from Lexapro to sertraline and missing Concerta bc CO crash from it after about 4 hours.  Disc these concerns.  She'll discuss also with Corie Chiquito, NP More dep the  last couple of days and concerned about reducing Spravato frequency while transition from one antidep to another.  Affect less positive. Patient was administered Spravato 84 mg intranasally today.  The patient experienced the typical dissociation which gradually resolved over the 2-hour period of observation.  There were no complications.  Specifically the patient did not have nausea or vomiting or headache.  Specifically HA is less of a problem with Spravato. No SE with meds. Intensity of Spravato dissociation varies.  No scary and well tolerated.   07/14/22 appt noted: Has been more depressed this week.  More trouble with sleep.  Taking clonazepam 1-1.5  of the 0.5 mg tablets.   Trazodone not helping much. She switched back from 200 sertraline to Lexapro 20.  No SE More trouble with motivation.  Some stress with son with special needs. Misses the benevit of Concerta but it was too short acting and crashed in 4-6 hours. Patient was administered Spravato 84 mg intranasally today.  The patient experienced the typical dissociation which gradually resolved over the 2-hour period of observation.  There were no complications.  Specifically the patient did not have nausea or vomiting or headache.  Specifically HA is less of a problem with Spravato. No SE with meds. Intensity of Spravato dissociation varies.  No scary and well tolerated.   07/17/22 appt noted: Patient was administered Spravato 84 mg intranasally today.  The patient experienced the typical dissociation which gradually resolved over the 2-hour period of observation.  There were no complications.  Specifically the patient did not have nausea or vomiting or headache.  Specifically HA is less of a problem with Spravato. No SE with meds. Intensity of Spravato dissociation varies.  Not scary and well tolerated.  Just got Jornay last night but wasn't sure how to take it.  Wants to start and this was discussed.  She felt MPH helped mood and attn but  didn't last long enough and had crash after a few hours on Concerta.  Better for 2 hours after each dose Ritalin 20 mg with mood but then it wore off.  BP up after 3rd dose 160/90 and then took H's amlodipine 5 and BP became normal.  07/22/22 appt noted: Patient was administered Spravato 84 mg intranasally today.  The patient experienced the typical dissociation which gradually resolved over the 2-hour period of observation.  There were no complications.  Specifically the patient did not have nausea or vomiting or headache.  Specifically HA is less of a problem with Spravato. No SE with meds. Intensity of Spravato dissociation varies.  Not scary and well tolerated.  Start Jornay 20 mg over the weekend and did not notice any particular effect good or bad.  Increased to 40 mg. Other psych meds: Clonazepam 0.5 mg tablets 2 nightly, gabapentin dosage varies, Dayvigo 10 mg nightly, Lexapro 20 mg daily, to trazodone 100 mg nightly  07/24/22 appt noted: Patient was administered Spravato 84 mg intranasally today.  The patient experienced the typical dissociation which gradually resolved  over the 2-hour period of observation.  There were no complications.  Specifically the patient did not have nausea or vomiting or headache.  Specifically HA is less of a problem with Spravato. No SE with meds. Intensity of Spravato dissociation varies.  Not scary and well tolerated.  Other psych meds: Clonazepam 0.5 mg tablets 2 nightly, gabapentin dosage varies, Dayvigo 10 mg nightly, Lexapro 20 mg daily, to trazodone 100 mg nightly, Jornay 40 mg PM Doing well with Spravato.  Noticed a little effect from the Jornay 40 without SE but would like to increase the dose for ADD and mood.  Sleeping well.  No new concerns.  Still more depressed than she was with th initial benefit of Spravato.  07/30/22 note:  Patient was administered Spravato 84 mg intranasally today.  The patient experienced the typical dissociation which gradually  resolved over the 2-hour period of observation.  There were no complications.  Specifically the patient did not have nausea or vomiting or headache.  Specifically HA is less of a problem with Spravato. Other psych meds: Clonazepam 0.5 mg tablets 2 nightly, gabapentin dosage varies, Dayvigo 10 mg nightly, Lexapro 20 mg daily, to trazodone 100 mg nightly, Jornay 60 mg PM Wants to increase Jornay to 80 mg PM to increase benefit for ADD and depression. No SE She has experienced a major family crisis that threatens to change her daily life from now on.  It has not triggered suicidal thoughts at this time but she is very afraid.  No desire to change medications.  08/04/22 appt noted" Patient was administered Spravato 84 mg intranasally today.  The patient experienced the typical dissociation which gradually resolved over the 2-hour period of observation.  There were no complications.  Specifically the patient did not have nausea or vomiting or headache.  Specifically HA is less of a problem with Spravato. Other psych meds: Clonazepam 0.5 mg tablets 2 nightly, gabapentin dosage varies, Dayvigo 10 mg nightly, Lexapro 20 mg daily, trazodone 100 mg nightly, Jornay 80 mg PM No SE No benefit or SE with increase Jormay 80 mg.  Says Concerta helped ADD and depression but not Jornay.  However duration was insufficient.  Still depressed and wants change to another form of stimulant.  No concerns with other meds. Continues with strong family stressors creating uncertainty about her future but no quick resolution. No SI Trial Cotempla 17.3 and DC Ritalin & Jornay 80  08/11/2022 appointment noted: Patient was administered Spravato 84 mg intranasally today.  The patient experienced the typical dissociation which gradually resolved over the 2-hour period of observation.  There were no complications.  Specifically the patient did not have nausea or vomiting or headache.  Specifically HA is less of a problem with Spravato now  vs when started it.. Other psych meds: Clonazepam 0.5 mg tablets 2 nightly, gabapentin dosage varies, Dayvigo 10 mg nightly, trazodone 100 mg nightly, cotempla 17.3, switch from Lexapro 20 to sertaline 15o mg . No noticeable effects sertraline from for depression but has seen some antianxiety effects from the sertraline.  No side effects noted.  No side effects with the other medicines.  Still is only getting very brief benefit 3 to 4 hours from Cotempla for ADD and mood.  She wants to try the Daytrana patch as we have discussed before. No SE  08/13/22 appt: Patient was administered Spravato 84 mg intranasally today.  The patient experienced the typical dissociation which gradually resolved over the 2-hour period of observation.  There were no  complications.  Specifically the patient did not have nausea or vomiting or headache.  Specifically HA is less of a problem with Spravato now vs when started it.. Other psych meds: Clonazepam 0.5 mg tablets 2 nightly, gabapentin dosage varies, Dayvigo 10 mg nightly, trazodone 100 mg nightly, cotempla 17.3, switch from Lexapro 20 to sertaline 15o mg . No noticeable effects sertraline from for depression but has seen some antianxiety effects from the sertraline.  No side effects noted.  No side effects with the other medicines.  Still is only getting very brief benefit 3 to 4 hours from Cotempla for ADD and mood.  She wants to try the Daytrana patch as we have discussed before.  Hasn't been able to get it yet. No SE Plan.  Continue to try to get Daytrana 30 AM  08/18/22 appt noted: Patient was administered Spravato 84 mg intranasally today.  The patient experienced the typical dissociation which gradually resolved over the 2-hour period of observation.  There were no complications.  Specifically the patient did not have nausea or vomiting or headache.  Specifically HA is less of a problem with Spravato now vs when started it.. Other psych meds: Clonazepam 0.5 mg  tablets 2 nightly, gabapentin dosage varies, Dayvigo 10 mg nightly, trazodone 100 mg nightly, cotempla 17.3, switch from Lexapro 20 to sertaline 15o mg . No noticeable effects sertraline from for depression but has seen some antianxiety effects from the sertraline.  No side effects noted.  No side effects with the other medicines.  Still is only getting very brief benefit 3 to 4 hours from Cotempla for ADD and mood.  She wants to try the Daytrana patch as we have discussed before.  Hasn't been able to get it yet.  Overall depression is some better than it was.   No SE Plan.  Continue to try to get Daytrana 30 AM  08/25/22 appt : No benefit Daytrana.  No SE.  Wants further med changes and interested In retrying pramipexole.   Tolerating meds. Patient was administered Spravato 84 mg intranasally today.  The patient experienced the typical dissociation which gradually resolved over the 2-hour period of observation.  There were no complications.  Specifically the patient did not have nausea or vomiting or headache.  Specifically HA is less of a problem with Spravato now vs when started it.. Other psych meds: Clonazepam 0.5 mg tablets 2 nightly, gabapentin dosage varies, Dayvigo 10 mg nightly, trazodone 100 mg nightly, , switch from Lexapro 20 to sertaline 15o mg . Daytrana 30 AM for a few days. Struggles with depression and no SI.  Reduced interest and mood and motivation and enjoyment and socialization.  08/27/22 appt noted: Psych meds:  Clonazepam 0.5 mg tablets 2 nightly, gabapentin dosage varies, Dayvigo 10 mg nightly, trazodone 100 mg nightly,  sertaline 15o mg . Returned to [employer] AM, pramipexole  Tolerating meds. Patient was administered Spravato 84 mg intranasally today.  The patient experienced the typical dissociation which gradually resolved over the 2-hour period of observation.  There were no complications.  Specifically the patient did not have nausea or vomiting or headache.   Reports  taking cotempla bc brief mood benefit and sig focus and productivity benefit. Took 1 dose pramipexole and felt more dep.  09/01/22 appt noted: Psych meds:  Clonazepam 0.5 mg tablets 2 nightly, gabapentin dosage varies, Dayvigo 10 mg nightly, trazodone 100 mg nightly,  sertaline 15o mg . Returned to Cotempla AM, pramipexole  0.25 mg BID. Tolerating meds now. Patient  was administered Spravato 84 mg intranasally today.  The patient experienced the typical dissociation which gradually resolved over the 2-hour period of observation.  There were no complications.  Specifically the patient did not have nausea or vomiting or headache.   Willing to try the pramipexole and will continue 0.25 mg BID this week and then consider increase if tolerated.  Mood is a little better.  Still looking for further improvement.  09/03/22 appt noted: Psych meds:  Clonazepam 0.5 mg tablets 2 nightly, gabapentin dosage varies, Dayvigo 10 mg nightly, trazodone 100 mg nightly,  sertaline 15o mg . Returned to Cotempla AM, pramipexole  0.25 mg tablet 1 and 1/2 tablets twice daily BID. Tolerating meds now. Patient was administered Spravato 84 mg intranasally today.  The patient experienced the typical dissociation which gradually resolved over the 2-hour period of observation.  There were no complications.  Specifically the patient did not have nausea or vomiting or headache.   Depression may be a little better with the parmipexole and is tolerating it well so far.  Still struggling with focus and residual depression.  Tolerating meds without SE.  More hopeful and able to enjoy some things.  09/08/22 appt  Psych meds:  Clonazepam 0.5 mg tablets 2 nightly, gabapentin dosage varies, Dayvigo 10 mg nightly, trazodone 100 mg nightly,  sertaline 15o mg . Returned to Cotempla AM, pramipexole  0.5 mg BID. Tolerating meds now. Patient was administered Spravato 84 mg intranasally today.  The patient experienced the typical dissociation which  gradually resolved over the 2-hour period of observation.  There were no complications.  Specifically the patient did not have nausea or vomiting or headache.   She has seen her depression drop from a 10/10 prior to treatment with Spravato to now 4/10 With additional benefit noted when she increased the pramipexole to 0.5 mg twice daily.  She is not having side effects with this like she did in the past. Anxiety levels are also improved. She is sleeping okay with the current medications. Plan: continue pramipexole trial off-label and increase more slowly for TRD.  Increase  Gradually  to 0.75 mg BID  09/11/22 appt noted: Psych meds:  Clonazepam 0.5 mg tablets 2 nightly, gabapentin dosage varies, Dayvigo 10 mg nightly, trazodone 100 mg nightly,  sertaline 15o mg . Returned to Cotempla AM, pramipexole  0.75 mg BID. Tolerating meds now. Patient was administered Spravato 84 mg intranasally today.  The patient experienced the typical dissociation which gradually resolved over the 2-hour period of observation.  There were no complications.  Specifically the patient did not have nausea or vomiting or headache.   She has seen her depression drop from a 10/10 prior to treatment with Spravato to now 4/10 With additional benefit noted when she increased the pramipexole to 0.5 mg twice daily.  She is not having side effects with this like she did in the past. Clearly improving with the pramipexole now and tolerating it.  Satisfied with current meds but would consider increasing if it might be helpful.  09/15/22 appt noted: Psych meds:  Clonazepam 0.5 mg tablets 2 nightly, gabapentin dosage varies, Dayvigo 10 mg nightly, trazodone 100 mg nightly,  sertaline 15o mg . Returned to Cotempla AM, pramipexole  0.75 mg BID too anxious and reduced to 0.5 mg BID. Tolerating meds now. Patient was administered Spravato 84 mg intranasally today.  The patient experienced the typical dissociation which gradually resolved over  the 2-hour period of observation.  There were no complications.  Specifically  the patient did not have nausea or vomiting or headache.   Trouble staying asleep longer than 6 hours.  Doesn't feel she can tolerate pramipexole 0.75 mg BID DT anxiety not experienced at  0.5 mg BID.  Otherwise tolerating meds.  Mood is better with Spravato and pramipexole.   Not sleeping as long with meds.  EMA 6 hours.  Wonders about change Disc concerns about waiting for therapy. Feels ready to reduce Spravato to weekly.    09/25/22 appt noted: Psych meds:  Clonazepam 0.5 mg tablets 2 nightly, gabapentin dosage varies, Dayvigo 10 mg nightly, trazodone 100 mg nightly,  sertaline 15o mg . Returned to Cotempla AM, pramipexole  0.75 mg BID too anxious and reduced to 0.5 mg BID. Tolerating meds now. Patient was administered Spravato 84 mg intranasally today.  The patient experienced the typical dissociation which gradually resolved over the 2-hour period of observation.  There were no complications.  Specifically the patient did not have nausea or vomiting or headache.   Trouble staying asleep longer than 6 hours.  Failed to respond to Seroquel 25 mg HS.  Gone back to [facility].  Belsomra NR. Mood ok until the last few days more dep bc it's been 10 days since last admin.  Wants to continue weekly.   No SE.  No med changes desired.  09/29/22 appt noted: Psych meds:  Clonazepam 0.5 mg tablets 2 nightly, gabapentin dosage varies, Dayvigo 10 mg nightly, trazodone 100 mg nightly,  sertaline 15o mg . Returned to Cotempla AM, pramipexole  0.75 mg BID too anxious and reduced to 0.5 mg BID. Tolerating meds now. Patient was administered Spravato 84 mg intranasally today.  The patient experienced the typical dissociation which gradually resolved over the 2-hour period of observation.  There were no complications.  Specifically the patient did not have nausea or vomiting or headache.   Trouble staying asleep longer than 6 hours.  Plan:  Continue sertaline to 200 mg daily.  It has helped anxiety but not depression.   for insomnia, clonazepam 1 mg HS. trazodone doesn't work well but helps some Continue Dayvigo 10 HS.  Failed resp Belsomra 15 and seroquel 25 She wants to continue Cotempla or Concerta bc helps mood early in day and focus  benefit lasts longer.  Have not been able to get stimulant to be consitently effective throughout the day. continue pramipexole trial off-label and reduce back to  TRD 0.5 mg BID Spravato weekly.  10/13/22 appt noted: Not sleeping as well.  Wants to increase Spravato to twice weekly bc feeling more dep close to the next sched date.  Just the day or 2 before.  .  Plan: Trial for off label TRD increase pramipexole 1 mg TID  10/20/22 appt noted: Psych meds:  Clonazepam 0.5 mg tablets 2 nightly, gabapentin dosage varies, Dayvigo 10 mg nightly, trazodone 100 mg nightly,  sertaline 15o mg . Returned to Cotempla AM, pramipexole  1 mg TID. Tolerating meds now. Patient was administered Spravato 84 mg intranasally today.  The patient experienced the typical dissociation which gradually resolved over the 2-hour period of observation.  There were no complications.  Specifically the patient did not have nausea or vomiting or headache.   Trouble staying asleep longer than 6 hours.  More dep this week and asked to get Spravato twice this week. Plan: Trial for off label TRD increase pramipexole 1 mg QID  10/22/22 appt noted:  Psych meds:  Clonazepam 0.5 mg tablets 2 nightly and another one with  EMA, gabapentin dosage varies, Belsomra 20 mg daily,  trazodone 100 mg nightly,  sertaline 15o mg . Returned to Cotempla AM, pramipexole  1 mg QID for 2nd day Tolerating meds now.  Except a little N and dizzy briefly after takes it. Patient was administered Spravato 84 mg intranasally today.  The patient experienced the typical dissociation which gradually resolved over the 2-hour period of observation.  There were no  complications.  Specifically the patient did not have nausea or vomiting or headache. No change in mood yet.   Sleep better with Belsomra 20 as long as takes with other meds.  If insomnia mood is worse. Ongoing some dep but gets partial relief with Spravato but would like better relief .  10/27/22 received Spravato 84  11/03/22 appt noted:  Meds as above except sertraline she cut to 100 mg instead of 200 mg daily She doesn't think sertraline helping and worries over poss SE Off and on Cotempla .  Not affecting BP. Doesn't notice benefit or SE with pramipexole 1 mg QID.  No SE Sleep is OK.  Some awakening aftter 6 hours but not enough so takes another clonazepam.   Patient was administered Spravato 84 mg intranasally today.  The patient experienced the typical dissociation which gradually resolved over the 2-hour period of observation.  There were no complications.  Specifically the patient did not have nausea or vomiting or headache. No change in mood yet.   Sleep better with Belsomra 20 as long as takes with other meds.  If insomnia mood is worse. Ongoing some dep but gets partial relief with Spravato but would like better relief .  No sig benefit with pramipexole 1 mg QID.  11/21/22 TC:  To ER with dep 11/21/22. No SE with selegiline but feels more dep.  Wants to stop it. Today is only day 2 of 1 tablet of selegiline 5 mg AM.  Reviewed with her that she had not been satisfied with the response of Spravato and Zoloft and that she is likely feeling worse because the Zoloft is wearing off and in particular because she abruptly stopped 150 mg daily.  She needs to give the selegiline time to work.  She agrees to do so.  She admitted to some passive suicidal thoughts but no active suicidal thoughts and no desire to die.  She agrees to the plan to increase selegiline to 10 mg every morning this weekend and to continue the selegiline trial.  She cannot stop this and immediately start another AD due to risk  DDI with serotonin syndrome.  She is aware.  Heather Staggers, MD, DFAPA  12/19/22 TC: Pt called at 1:42p.  She said she wanted to know if she could start weaning off the Selegiline.  She thinks increasing the dosage is making things worse.  She said she wants to go back to [employer].  She acknowleged she has an appt on Monday. I advised her that I wasn't sure Dr Jennelle Human will see her message before her appt, but she still ask me to send the message.  Next appt 8/26 MD resp.  No change until Monday appt.  12/22/22 appt noted: Meds: selegiline up to 5 mg 4 daily for a week but felt it incr irritability and went back to 3 mg daily. She wants to go back to Spravato.  Not near as dep as now.  Selegiline didn't help energy. Still on Belsomra Thinks maybe sertraline helped mood more than she thought. More dep than anxiety.  Plan: DC selegiline and will retry Effexor which helped in the past at 300 mg daily.  Start this Friday after over 10 half lives of selegiline.    12/24/22 appt noted: Stopped selegiline  Psych med: belsomra 20, clonazepam 1 mg HS.   She has felt more dep off the Spravato though at the time didn't think it was helping.  Has felt more down and withdrawn.  Low energy, motivation, not smiling.  No SI.  Sleep ok.   Patient was administered Spravato 84 mg intranasally today.  The patient experienced the typical dissociation which gradually resolved over the 2-hour period of observation.  There were no complications.  Specifically the patient did not have nausea or vomiting or headache. No problems with BP or other unusual SE Mood is a little better after this administration of Spravato.  Very motivated to continue it.  No immediate med concerns. No SI .  Ongoing anhedonia.  12/25/22 appt noted: Psych med: belsomra 20, clonazepam 1 mg HS.  Not resumed stimulant or AD yet   No SE. Patient was administered Spravato 84 mg intranasally today.  The patient experienced the typical dissociation  which gradually resolved over the 2-hour period of observation.  There were no complications.  Specifically the patient did not have nausea or vomiting or headache.  There is a lot things going on in her life and it is somewhat difficult to separate stressors from what is going on with her mood.  However she is happy to resume Spravato as she decided after stopping briefly that it was helpful.  She plans to start the antidepressant Morrow.  She also wants to resume the stimulant tomorrow.  12/30/22 appt noted; Psych med: belsomra 20, clonazepam 1 mg HS.  Not resumed stimulant.  Effexor XR 37.5 mg daily   No SE. Patient was administered Spravato 84 mg intranasally today.  The patient experienced the typical dissociation which gradually resolved over the 2-hour period of observation.  There were no complications.  Specifically the patient did not have nausea or vomiting or headache.  She is still having depression with very prominent anhedonia.  However she feels the Spravato is somewhat helpful and is hopeful for more complete response as she gets back onto a reasonable antidepressant dose.  She tolerated Effexor XR 300 mg daily in the past and took it for 5 months and it is probably the antidepressants worked best for her.  She is agreeable with this plan.  She is getting sleep benefit from the current medications Plan: incr Effexor 75 mg daily  01/02/23 appt noted: Psych meds as noted above except increase Effexor XR to 75 mg daily without side effects. Patient was administered Spravato 84 mg intranasally today.  The patient experienced the typical dissociation which gradually resolved over the 2-hour period of observation.  There were no complications.  Specifically the patient did not have nausea or vomiting or headache.  Agrees to increasing Effexor as planned.  SX not changed from that noted above.  No SI  01/05/23 appt noted: Psych meds as noted above except increase Effexor XR to 112.5 mg daily  without side effects. Patient was administered Spravato 84 mg intranasally today.  The patient experienced the typical dissociation which gradually resolved over the 2-hour period of observation.  There were no complications.  Specifically the patient did not have nausea or vomiting or headache.  Agrees to increasing Effexor as planned gradually to 300 mg daily.  SX not changed from that noted above.  Does however feel Spravato has dampened the degree of depression.  No SI Stressful life event discussed and won't be resolved until early next year.  01/07/23 appt noted: Psych meds Effexor XR 112.5 mg daily. Patient was administered Spravato 84 mg intranasally today.  The patient experienced the typical dissociation which gradually resolved over the 2-hour period of observation.  There were no complications.  Specifically the patient did not have nausea or vomiting or headache.  Agrees to increasing Effexor as planned gradually to 300 mg daily.  SX not changed from that noted above.  Does however feel Spravato has dampened the degree of depression markedly compared to when off psych meds and no Spravato.  No SI  01/12/23 appt noted; Psych meds Effexor XR 225 mg daily. No SE notedl. Patient was administered Spravato 84 mg intranasally today.  The patient experienced the typical dissociation which gradually resolved over the 2-hour period of observation.  There were no complications.  Specifically the patient did not have nausea or vomiting.  But is having headache.  Agrees to increasing Effexor as planned gradually to 300 mg daily.  SX not changed from that noted above.  Does however feel Spravato has dampened the degree of depression markedly compared to when off psych meds and no Spravato.  No SI  01/15/23 appt noted:  Psych meds Effexor XR 225 mg daily. No SE noted. Patient was administered Spravato 84 mg intranasally today.  The patient experienced the typical dissociation which gradually resolved  over the 2-hour period of observation.  There were no complications.  Specifically the patient did not have nausea or vomiting.   Doing ok with higher doses of Effexor and will plan to increase to 300 mg daily. Mild improvement noticed so far with the incrase in meds.   Uses Cotempla prn and it helps but doesn't liek the way she feels when it wears off.  01/22/23 appt noted: Psych meds reduced Effexor XR 150 mg daily. Bc of HA No SE noted. Patient was administered Spravato 84 mg intranasally today.  The patient experienced the typical dissociation which gradually resolved over the 2-hour period of observation.  There were no complications.  Specifically the patient did not have nausea or vomiting.  Ongoing moderate levels of depression with some improvement from the Spravato.  She is not sure about the effect of the venlafaxine on her mood yet.  She would like to continue to try pursuing a stimulant but has had difficulty tolerating p.o. versions of stimulants because of the crash at the end and side effects with many of them.  She has several questions about this.  01/27/23 appt noted: Psych meds: Clonazepam 1 to 1.5 mg nightly, lithium CR 300 nightly, Belsomra 20 nightly, venlafaxine XR 150 every morning No side effects noted at the current dosage. Patient was administered Spravato 84 mg intranasally today.  The patient experienced the typical dissociation which gradually resolved over the 2-hour period of observation.  There were no complications.  Specifically the patient did not have nausea or vomiting.  Ongoing moderate levels of depression with some improvement from the Spravato.   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  She does not tolerate oral stimulants very well because she has a crash as they wear off which is very dysphoric.  She is willing to retry the Daytrana patches that she took in the spring.  She took 1 patch on  1 occasion recently and  felt it was helpful. However she is also having problems with early morning awakening over the last week without precipitant.  She would like to have a different treatment strategy for sleep but understands her sleep problems have historically been difficult to resolve and maintain.  02/10/23 appt noted: Psych meds: Clonazepam 1 to 1.5 mg nightly, lithium CR 300 nightly, Belsomra 20 nightly, venlafaxine XR 150 every morning, Daytrana 30 mg Am No side effects noted at the current dosage. Patient was administered Spravato 84 mg intranasally today.  The patient experienced the typical dissociation which gradually resolved over the 2-hour period of observation.  There were no complications.  Specifically the patient did not have nausea or vomiting.  Ongoing moderate levels of depression with some improvement from the Spravato.  Particularly anhedonia.  No sig benefit noted with Daytrana and problems with table stimulants bc gets brief benefit with unpleasant "crash".  Wants to try another type of patch if available.  02/17/23 appt noted: Psych meds: Clonazepam 1 to 1.5 mg nightly, lithium CR 300 nightly, Belsomra 20 nightly, venlafaxine XR 225 mg every morning, Daytrana 30 mg Am No side effects noted at the current dosage. Patient was administered Spravato 84 mg intranasally today.  The patient experienced the typical dissociation which gradually resolved over the 2-hour period of observation.  There were no complications.  Specifically the patient did not have nausea or vomiting.  Ongoing moderate levels of depression with some improvement from the Spravato.  Particularly anhedonia.  She has not gotten the new stimulant yet but is hopeful. No problems with increasing venlafaxine.   Mood is better with Spravato and venlafaxine.  Less anhedonia, less down. Themazepam not helpful.    03/04/23 received Spravato 84 03/12/23 received Spravato 84  03/19/23 appt noted: Psych meds:  Clonazepam 1 to 1.5 mg nightly, lithium CR 300 nightly, Belsomra 20 nightly, venlafaxine XR 300 mg every morning, tried various stimulants lately No side effects noted at the current dosage. Patient was administered Spravato 84 mg intranasally today.  The patient experienced the typical dissociation which gradually resolved over the 2-hour period of observation.  There were no complications.  Specifically the patient did not have nausea or vomiting.  Not getting good duration from oral stimulants.  She would like to try another patch but has to take tabs first and try them before insurance will cover patches.  Overall dep is better with meds and Spravato.  Clearly sees benefit.  03/30/23 appt noted: Psych meds: Clonazepam 1 to 1.5 mg nightly, lithium CR 300 nightly,  venlafaxine XR 300 mg every morning, tried various stimulants lately, gabapentin 900 mg HS, dexmethylphenidate ER without help No side effects noted at the current dosage. Patient was administered Spravato 84 mg intranasally today.  The patient experienced the typical dissociation which gradually resolved over the 2-hour period of observation.  There were no complications.  Specifically the patient did not have nausea or vomiting.  Able to leave at end of 2 hour period without assistance.  Clear mood benefit with Spravato with much less dep and wants to continue. Still trouble sleeping. TR insomnia.  Ongoing conc problems with minimal brief benefit from stimulants.  Not able to get patch Caprice Red yet DT insurance.  04/14/23 appt noted: Psych meds: Clonazepam 1 to 1.5 mg nightly, lithium CR 300 nightly,  she reduced on her own venlafaxine XR to 150 mg every morning, tried various stimulants lately, gabapentin 900 mg HS, dexmethylphenidate ER without help No side effects noted at the  current dosage. Patient was administered Spravato 84 mg intranasally today.  The patient experienced the typical dissociation which gradually resolved over the  2-hour period of observation.  There were no complications.  Specifically the patient did not have nausea or vomiting.  Able to leave at end of 2 hour period without assistance.  Clear mood benefit with Spravato with much less dep and wants to continue. she went to [facility] and had her brain scanned at [facility]. they recommend she taper off Effexor which she is only taking 150 mg currently and start Auvelity. She reduce Effexor on her own to 150 mg daily. She wants further improvement in anhedonia.    04/28/23 received Spravato 84  05/11/23 appt noted: Psych meds: Clonazepam 1 to 1.5 mg nightly, lithium CR 300 nightly,  venlafaxine XR to 150 mg every morning, Auvelity BID, tried various stimulants lately, gabapentin 900 mg HS, dexmethylphenidate ER without help No side effects noted at the current dosage. Patient was administered Spravato 84 mg intranasally today.  The patient experienced the typical dissociation which gradually resolved over the 2-hour period of observation.  There were no complications.  Specifically the patient did not have nausea or vomiting.  Able to leave at end of 2 hour period without assistance.  Clear mood benefit with Spravato with much less dep and wants to continue. Overall her mood is still significantly better with the combination of Spravato venlafaxine and Auvelity in particular.  She has not had any problems from the reduction in venlafaxine but is not dramatically improved with regard to residual depression since adding Auvelity but she is not having side effects.  We agreed that a longer trial of the helpful.  She is also wondering about increasing the Spravato frequency from once every other week to once every 10 days in order to prove improved residual depression and specifically anhedonia.  Overall though she is dramatically better with the current combination of meds. Plan: continue venlafaxine ER 150 with Auvelity BID bc TRD  05/25/23 appt noted: Psych meds:  Clonazepam 1 to 1.5 mg nightly, lithium CR 300 nightly,  venlafaxine XR to 150 mg every morning, Auvelity BID, tried various stimulants lately, gabapentin 900 mg HS, dexmethylphenidate ER without help No side effects noted at the current dosage. Patient was administered Spravato 84 mg intranasally today.  The patient experienced the typical dissociation which gradually resolved over the 2-hour period of observation.  There were no complications.  Specifically the patient did not have nausea or vomiting.  Able to leave at end of 2 hour period without assistance.  Clear mood benefit with Spravato with much less dep and wants to continue.  06/04/23 appt noted: Psych meds: Clonazepam 1 to 1.5 mg nightly, lithium CR 300 nightly,  venlafaxine XR to 150 mg every morning, Auvelity 1 in the AM, tried various stimulants lately, gabapentin 900 mg HS, no current stimulant (Vyvanse 40 NR) She reduced Auvelity to 1 daily DT tremor she thinks it made worse and bc no benefit noted with it. She is interested in increasing the Effexor at 300 mg daily bc did get some benefit from it in the past and tolerated that dos. Chronic dep is better with Spravato but not gone.  Some anhedonia.  Focus is still not good. Problems getting patch ADD med. Patient was administered Spravato 84 mg intranasally today.  The patient experienced the typical dissociation which gradually resolved over the 2-hour period of observation.  There were no complications.  Specifically the patient did  not have nausea or vomiting.  Able to leave at end of 2 hour period without assistance.  Clear mood benefit with Spravato with much less dep and wants to continue.  06/16/23 appt noted: Psych meds: Clonazepam 1 to 1.5 mg nightly, lithium CR 300 nightly,  venlafaxine XR didn't yet increase so  225 mg every morning, , tried various stimulants lately, gabapentin 900 mg HS, no current stimulant (Vyvanse 40 NR), trazodone 50 HS Chronic dep is better with Spravato  but not gone.  Some anhedonia.  Focus is still not good. Problems getting patch ADD med. Patient was administered Spravato 84 mg intranasally today.  The patient experienced the typical dissociation which gradually resolved over the 2-hour period of observation.  There were no complications.  Specifically the patient did not have nausea or vomiting.  Able to leave at end of 2 hour period without assistance.  Clear mood benefit with Spravato with much less dep and wants to continue. She has not increased venlafaxine to 300 mg yet but is eager to do so.  She still has some sleep issues and wonders if trazodone is working.  She has no side effect with current medications.  She would like to try an alternative med for ADD since the stimulants have not been effective. Plan: increase venlafaxine XR to 300 mg every morning  06/29/23 appt noted: Med: Clonazepam 1 to 1.5 mg nightly, lithium CR 300 nightly,  venlafaxine XR 300 mg every morning, , tried various stimulants lately, gabapentin 900 mg HS, no current stimulant (Vyvanse 40 NR), trazodone 50 HS No SE Patient was administered Spravato 84 mg intranasally today.  The patient experienced the typical dissociation which gradually resolved over the 2-hour period of observation.  There were no complications.  Specifically the patient did not have nausea or vomiting.  Able to leave at end of 2 hour period without assistance.  Clear mood benefit with Spravato with much less dep and wants to continue.    AIMS    Flowsheet Row Video Visit from 01/03/2022 in [facility]  AIMS Total Score 0      ECT-MADRS    Flowsheet Row Clinical Support from 08/13/2022 in [facility] Video Visit from 04/29/2022 in [facility] Video Visit from 02/06/2022 in [facility]  MADRS Total Score 27 44 23      GAD-7    Flowsheet Row Counselor from 12/19/2022 in [facility] Office Visit from 08/22/2022 in [facility] [place]  Total GAD-7 Score 4 7      PHQ2-9    Flowsheet Row Counselor from 12/19/2022 in [facility] Office Visit from 08/22/2022 in [facility] [place] Office Visit from 08/19/2022 in [facility] Video Visit from 02/06/2022 in [facility] Office Visit from 11/16/2020 in [facility] Total Score 2 3 0 6 0  PHQ-9 Total Score 5 5 -- 14 --        Past Psychiatric Medication Trials: Trintellix - Initially effective and then was not effective when re-started Paxil- Ineffective. Helped initially. Prozac-Insomnia Lexapro- Effective and then no longer as effective Zoloft- Initially effective and then minimal response Viibryd Cymbalta- Ineffective. Helped initially. Effexor XR 300- Effective, well tolerated. Pristiq- Increased anxiety at 150 mg daily Wellbutrin XL-Ineffective.May have increased anxiety. Amitriptyline  Nortriptyline-Caused irritability, constipation Auvelity- ineffective 2 trials Selegilne brief 20 mg daily , more irritable  Abilify- Helpful but caused severe insomnia. Rexulti-Helpful but caused insomnia. Vraylar Seroquel - Ineffective Risperdal Olanzapine- Ineffective Latuda- Ineffective Caplyta  Klonopin- Effective Temazepam- Took during menopause Lunesta- Ineffective Ambien-Ineffective Sonata- Ineffective Dayvigo- partially effective  Belsomra 20 worked and then failed  Trazodone-  somewhat effective Remeron-Ineffective. Does not recall wt gain.  Quetiapine 25 NR  Adderall- Took once and had adverse effect. Concerta- Effective but only 4-6 hours Jornay 80 NR Metadate-Not as effective as Concerta Dexmethylphenidate IR NR Dexmethylphenidate ER 10/02/21- Not as effective  as Concerta Azstarys- some side effects. Not as effective.  Ritalin short acting and SE anxiety & HTN @20  TID Cotempla 17.3  Daytrana NR Vyvanse 40 NR  Lamictal- May have had an adverse effects. "Felt weird." Reports worsening s/s.  Lithium ? effect Gabapentin Pramipexole- 1 mg QID NR  ECT #20 with minimal response. H says it was partly helpful.  AIMS    Flowsheet Row Video Visit from 01/03/2022 in [facility]  AIMS Total Score 0      ECT-MADRS    Flowsheet Row Clinical Support from 08/13/2022 in [facility] Video Visit from 04/29/2022 in [facility] Video Visit from 02/06/2022 in [facility]  MADRS Total Score 27 44 23      GAD-7    Flowsheet Row Counselor from 12/19/2022 in [facility] Office Visit from 08/22/2022 in [facility] [place]  Total GAD-7 Score 4 7      PHQ2-9    Flowsheet Row Counselor from 12/19/2022 in [facility] Office Visit from 08/22/2022 in [facility] [place] Office Visit from 08/19/2022 in [facility] for [facility] Video Visit from 02/06/2022 in [facility] Office Visit from 11/16/2020 in [facility] Total Score 2 3 0 6 0  PHQ-9 Total Score 5 5 -- 14 --        Review of Systems:  Review of Systems  Constitutional:  Positive for fatigue.  HENT:  Negative for dental problem.   Cardiovascular:  Negative for palpitations.  Neurological:  Positive for tremors. Negative for dizziness and headaches.  Psychiatric/Behavioral:  Positive for decreased concentration, dysphoric mood and sleep disturbance. Negative for agitation, behavioral problems, confusion, hallucinations and suicidal ideas. The patient is nervous/anxious.      Medications: I have reviewed the patient's current medications.  Current Outpatient Medications  Medication Sig Dispense Refill   clonazePAM (KLONOPIN) 1 MG tablet Take 1-2 tablets (1-2 mg total) by mouth at bedtime. 60 tablet 0   Esketamine HCl, 84 MG Dose, (SPRAVATO, 84 MG DOSE,) 28 MG/DEVICE SOPK USE 3 SPRAYS IN EACH NOSTRIL ONCE A WEEK 3 each 1   Estradiol (VAGIFEM) 10 MCG TABS vaginal tablet Place 1 tablet (10 mcg total) vaginally 2 (two) times a week. 24 tablet 3   gabapentin (NEURONTIN) 300 MG capsule 2 at bedtime and 1-2 capsule if need for early morning awakening. 120 capsule 1   lithium carbonate (LITHOBID) 300 MG ER tablet TAKE 2 TABLETS BY MOUTH AT BEDTIME 180 tablet 0   traZODone (DESYREL) 50 MG tablet TAKE 1 TABLET BY MOUTH AT BEDTIME 30 tablet 0   valACYclovir (VALTREX) 500 MG tablet Take one twice daily for 3 days with any symptoms  30 tablet 3   valsartan (DIOVAN) 80 MG tablet Take 1 tablet (80 mg total) by mouth daily. 90 tablet 3   venlafaxine XR (EFFEXOR-XR) 75 MG 24 hr capsule Take 3 capsules (225 mg total) by mouth daily with breakfast. (Patient taking differently: Take 300 mg by mouth daily with breakfast.) 90 capsule 1   Dextroamphetamine (XELSTRYM) 13.5 MG/9HR PTCH Place 1 patch onto the skin every morning. (Patient not taking: Reported on 06/29/2023) 30 patch 0   No current facility-administered medications for this visit.    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   TRANSAMINASES, SERUM, ELEVATED 08/14/2009    Past Medical History, Surgical history, Social history, and Family history were reviewed and updated  as appropriate.   Please see review of systems for further details on the patient's review from today.   Objective:   Physical Exam:  LMP 12/27/2007 (Exact Date)   Physical Exam Constitutional:      General: She is not in acute distress. Musculoskeletal:        General: No deformity.  Neurological:     Mental Status: She is alert and oriented to person, place, and time.     Motor: Tremor present.     Coordination: Coordination normal.     Comments: Minimal tremor  Psychiatric:        Attention and Perception: Perception normal. She is inattentive. She does not perceive auditory hallucinations.        Mood and Affect: Mood is anxious and depressed. Affect is blunt. Affect is not tearful.        Speech: Speech normal. Speech is not rapid and pressured.        Behavior: Behavior normal.        Thought Content: Thought content normal. Thought content is not delusional. Thought content does not include homicidal or suicidal ideation. Thought content does not include suicidal plan.        Cognition and Memory: Cognition and memory normal.        Judgment: Judgment normal.     Comments: Insight intact 4/10 dep but better with Spravato markedly. Affect blunted chronically     Lab Review:     Component Value Date/Time   NA 132 (L) 10/27/2022 1107   K 4.4 10/27/2022 1107   CL 95 (L) 10/27/2022 1107   CO2 30 10/27/2022 1107   GLUCOSE 89 10/27/2022 1107   BUN 14 10/27/2022 1107   CREATININE 0.67 10/27/2022 1107   CREATININE 0.68 01/26/2020 0920   CALCIUM 10.1 10/27/2022 1107   PROT 8.3 04/22/2022 0932   ALBUMIN 5.3 (H) 04/22/2022 0932   AST 82 (H) 04/22/2022 0932   ALT 118 (H) 04/22/2022 0932   ALKPHOS 74 04/22/2022 0932   BILITOT 0.3 04/22/2022 0932   GFRNONAA >90 05/01/2014 1645   GFRAA >90 05/01/2014 1645       Component Value Date/Time   WBC 3.3 (L) 04/22/2022 0932   RBC 4.25 04/22/2022 0932   HGB 14.3 04/22/2022 0932   HGB 14.1 10/26/2014 1034   HCT 40.7  04/22/2022 0932  PLT 245.0 04/22/2022 0932   MCV 95.7 04/22/2022 0932   MCH 34.4 (H) 01/26/2020 0920   MCHC 35.1 04/22/2022 0932   RDW 13.2 04/22/2022 0932   LYMPHSABS 0.9 04/22/2022 0932   MONOABS 0.2 04/22/2022 0932   EOSABS 0.1 04/22/2022 0932   BASOSABS 0.0 04/22/2022 0932    No results found for: "POCLITH", "LITHIUM"   No results found for: "PHENYTOIN", "PHENOBARB", "VALPROATE", "CBMZ"   .res Assessment: Plan:    Nafisa was seen today for follow-up, depression, anxiety, add and sleeping problem.  Diagnoses and all orders for this visit:  Recurrent major depression resistant to treatment (HCC)  Generalized anxiety disorder  Attention deficit hyperactivity disorder (ADHD), predominantly inattentive type  Insomnia, unspecified type   Pt seen for TRD with multiple failed medications as noted above.    Mood was reportedly better with venlafaxine with Spravato but ongoing dep with anhedonia and fatigue.    There is an ongoing pattern of her making impulsive decisions on her own about meds which are counter therapeutic and not informing MD of what is going on until after th fact.  That is not a helpful pattern and she is encouraged to stop it.  Patient was administered Spravato 84 mg intranasally today.  The patient experienced the typical dissociation which gradually resolved over the 2-hour period of observation.  There were no complications.  Specifically the patient did not have nausea or vomiting or headache.  Blood pressures remained within normal ranges at the 40-minute and 2-hour follow-up intervals.  By the time the 2-hour observation period was met the patient was alert and oriented and able to exit without assistance.  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  See nursing note for further details. Would like to increase frequency from every other week to every 10 days looking  for further improvement in depression.  Consider switch Spravato to MAOI, Parnate  We discussed the short-term risks associated with benzodiazepines including sedation and increased fall risk among others.  Discussed long-term side effect risk including dependence, potential withdrawal symptoms, and the potential eventual dose-related risk of dementia.  But recent studies from 2020 dispute this association between benzodiazepines and dementia risk. Newer studies in 2020 do not support an association with dementia.  for insomnia, wants to continue clonazepam 2 mg nightly. Can't sleep without it. Wants to continue trazodone also  Reports needing gabapentin [phone] mg HS to help sleep.  Cotempla or Concerta bc helps mood early in day and focus  benefit lasts longer.  Have not been able to get stimulant to be consitently effective throughout the day. Conisder switching to patch bc pt has significant crash with stimulants or perhaps [place].  Stimulant tablets cause crash with inadequate duration .  Patches should provide smoother response.  Will try another patch, Noemi Chapel if we can get it approved.   Insurance denied PA for this medication stating she has to try dextroamphetamine ER, Adderall XR, and Vyvanse before will be considered. Have been reluctant to try Adderall XR because she did not tolerate Adderall at home.     Discussed potential benefits, risks, and side effects of stimulants with patient to include increased heart rate, palpitations, insomnia, increased anxiety, increased irritability, or decreased appetite.  Instructed patient to contact office if experiencing any significant tolerability issues.  To max opportunity to respond for tRD, lithium 300 mg PM.  Disc pretreating Spravato with NSAID or Tylenol bc of  HA lately.  Supportive therapy dealing with serious stressful life event.  Plan: continue Clonazepam 1 to 1.5 mg nightly, lithium CR 300 nightly, increase venlafaxine XR to  300 mg every morning,  tried various stimulants lately, gabapentin 900 mg HS, trazodone 50 HS.  Consider Qelbree or strattera for treatment resistant ADD but first need to make sure she tolerates the venlafaxine because of potential interactions.  Has not seen benefit from stimulants so far.  Disc risk noradrenergic SE with the combo.  She generally  has high tolerance.   Strattera 25 mg daily and increase to 75 mg daily.  FU every 7-10 days  Heather Staggers, MD, DFAPA    Please see After Visit Summary for patient specific instructions.  Future Appointments  Date Time Provider Department Center  07/13/2023  9:00 AM Cottle, Steva Ready., MD [facility] None  07/13/2023  9:00 AM CP-NURSE [facility] None  08/21/2023  8:00 AM [name], MD [facility]  09/18/2023  8:15 AM Jerene Bears, MD [facility]    No orders of the defined types were placed in this encounter.   -------------------------------

## 2023-06-29 NOTE — Progress Notes (Signed)
 NURSES NOTE:           Patient arrived for her #46 Spravato treatment. Pt is coming every other week now. Pt is being treated for Treatment Resistant Depression, pt will be receiving 84 mg (3 of the 28 mg) nasal sprays, this is her maintenance dose. Patient taken to treatment room, I explained and discussed her treatment and how the schedule should go, along with any side effects that may occur. Answered any questions and concerns the patient had. Pt's Spravato is delivered through [place] and she is now billing with her insurance. Spravato medication is stored at doctors office per REMS/FDA guidelines. The medication is required to be locked behind two doors per FDA/REMS Protocol. Medication is also disposed of properly per regulations. All treatments and her vital signs are documented in Spravato REMS per protocol of treatment center regulations. [facility] delivers her medication.      Vital Signs assessed at 8:10 AM 135/79, pulse 62 pulse ox 92%. Instructed pt to blow her nose and recline back slightly to prevent any of medication dripping out of her nose.   Pt. Given 1st dose (28 mg inhaler), then 5 minutes between 2nd dose and 3rd dose for total of 84 mg. No complaints of nausea/vomiting reported. Pt did listen to music today, she just reclined back and closed her eyes. After 40 minutes I went to assess her vital signs, no side effects or symptoms reported, B/P 129/77, pulse 64.  Dr. Jennelle Human talked to pt today about her treatment. Nurse was with pt a total of 60 minutes but pt observed for 120 minutes per protocol. Discharge vital signs were at 10:05 AM, B/P 124/82, pulse 76. She verbalized understanding. She reports dissociation but she was clear upon her discharge. She is instructed to contact office if needing anything prior to her next treatment.  She is scheduled on March 13th.   LOT # [id number]   EXP Y [id number]

## 2023-07-09 ENCOUNTER — Ambulatory Visit: Admitting: Psychiatry

## 2023-07-09 ENCOUNTER — Ambulatory Visit

## 2023-07-09 VITALS — BP 107/74 | HR 95

## 2023-07-09 DIAGNOSIS — F411 Generalized anxiety disorder: Secondary | ICD-10-CM | POA: Diagnosis not present

## 2023-07-09 DIAGNOSIS — G4700 Insomnia, unspecified: Secondary | ICD-10-CM

## 2023-07-09 DIAGNOSIS — F339 Major depressive disorder, recurrent, unspecified: Secondary | ICD-10-CM | POA: Diagnosis not present

## 2023-07-09 DIAGNOSIS — F900 Attention-deficit hyperactivity disorder, predominantly inattentive type: Secondary | ICD-10-CM | POA: Diagnosis not present

## 2023-07-09 NOTE — Progress Notes (Signed)
 NURSES NOTE:           Patient arrived for her #10 Spravato treatment. Pt is coming every other week now. Pt is being treated for Treatment Resistant Depression, pt will be receiving 84 mg (3 of the 28 mg) nasal sprays, this is her maintenance dose. Patient taken to treatment room, I explained and discussed her treatment and how the schedule should go, along with any side effects that may occur. Answered any questions and concerns the patient had. Pt's Spravato is delivered through [place] and she is now billing with her insurance. Spravato medication is stored at doctors office per REMS/FDA guidelines. The medication is required to be locked behind two doors per FDA/REMS Protocol. Medication is also disposed of properly per regulations. All treatments and her vital signs are documented in Spravato REMS per protocol of treatment center regulations. [facility] delivers her medication.      Vital Signs assessed at 1:05 PM 110/70, pulse 72, SpO2 99%. Instructed pt to blow her nose and recline back slightly to prevent any of medication dripping out of her nose.   Pt. Given 1st dose (28 mg inhaler), then 5 minutes between 2nd dose and 3rd dose for total of 84 mg. No complaints of nausea/vomiting reported. Pt did listen to music today, she just reclined back and closed her eyes. After 40 minutes I went to assess her vital signs, no side effects or symptoms reported, B/P 113/67, pulse 78, SpO2 96%.  Dr. Jennelle Human talked to pt today about her treatment. Nurse was with pt a total of 60 minutes but pt observed for 120 minutes per protocol. Discharge vital signs were at 3:00 PM, B/P 107/74, pulse 77, SpO2 95%. She verbalized understanding. She reports dissociation but she was clear upon her discharge. She is instructed to contact office if needing anything prior to her next treatment.  She is scheduled on March 26th.   LOT # [id number]   EXP Y [id number]

## 2023-07-10 ENCOUNTER — Encounter: Payer: Self-pay | Admitting: Psychiatry

## 2023-07-10 NOTE — Progress Notes (Signed)
 Heather Davila [id number] [birth year] 63 y.o.  Subjective:   Patient ID:  Heather Davila is a 63 y.o. (DOB 14-Apr-1961) female.  Chief Complaint:  Chief Complaint  Patient presents with   Follow-up   Depression   Anxiety    HPI Heather Davila presents to the office today for follow-up of treatment resistant recurrent depression, generalized anxiety disorder and insomnia.  Almost too numerous to count med failures. She is here for Spravato administration for her treatment resistant depression.  05/21/22 appt noted: Patient was administered first dose Spravato 56 mg intranasally today.  The patient experienced the typical dissociation which gradually resolved over the 2-hour period of observation.  There were no complications.  Specifically the patient did not have nausea or vomiting or headache.   Dissociation was mild. Did not experience any emotional relief from disabling depression.wants to increase Spravato to the usual dose.  She is aware insurance is not covering the costs at this time.  No SI acutely. Tolerating meds.    05/23/22 appt noted: Patient was administered Spravato 84 mg intranasally today.  The patient experienced the typical dissociation which gradually resolved over the 2-hour period of observation.  There were no complications.  Specifically the patient did not have nausea or vomiting.   Dissociation was greater than last dose and a little bothersome DT some HA. Tolerating meds.   Mood has not changed significantly thus far with Spravato.  05/27/22 appt noted: Patient was administered Spravato 84 mg intranasally today.  The patient experienced the typical dissociation which gradually resolved over the 2-hour period of observation.  There were no complications.  Specifically the patient did not have nausea or vomiting or headache. Today she has felt a little relief from the pressing heaviness of depression but a little more anxiety.  During administration of Spravato she  listens to [place] music and was able to relax a little more with the feeling of dissociation that was mildly bothersome. Husband was also in on the session today and asked some questions about IV ketamine versus nasal spray ketamine.  05/29/22 appt noted: Cancelled today DT hypertension  05/30/22 appt noted: Patient was administered Spravato 84 mg intranasally today.  The patient experienced the typical dissociation which gradually resolved over the 2-hour period of observation.  There were no complications.  Specifically the patient did not have nausea or vomiting or headache. She is seeing some improvement in mood with less continuous depression and less intensity.  Hopefulness is better.   No med complaints or SE  06/05/22 appt noted: Patient was administered Spravato 84 mg intranasally today.  The patient experienced the typical dissociation which gradually resolved over the 2-hour period of observation.  There were no complications.  Specifically the patient did not have nausea or vomiting or headache.  Specifically HA is less of a problem with Spravato. No SE with meds. Depression is improving with less intensity.  Intermittent anxiety easily.  More able to enjoy things.  No new concerns.  06/17/22 appt noted: Patient was administered Spravato 84 mg intranasally today.  The patient experienced the typical dissociation which gradually resolved over the 2-hour period of observation.  There were no complications.  Specifically the patient did not have nausea or vomiting or headache.  Specifically HA is less of a problem with Spravato. No SE with meds. Depression is improved 45% with less intensity.  Intermittent anxiety less easily.  More able to enjoy things.  No new concerns.  More active. Current meds: increased clonazepam to about  1.5 mg HS DT recent insomnia, lexapro 20, Dayvigo 10 mg HS, concerta 36 mg AM, trazodone 100 mg HS.  06/20/22 appt noted: Current meds: increased clonazepam to  about 1.5 mg HS DT recent insomnia, lexapro 20, Dayvigo 10 mg HS, concerta 36 mg AM, trazodone 100 mg HS. Patient was administered Spravato 84 mg intranasally today.  The patient experienced the typical dissociation which gradually resolved over the 2-hour period of observation.  There were no complications.  Specifically the patient did not have nausea or vomiting or headache.  Specifically HA is less of a problem with Spravato. No SE with meds. Depression is improved 45% with less intensity.  Intermittent anxiety less easily.  More able to enjoy things.  No new concerns.  More active.  06/23/22 appt noted:  Current meds: increased clonazepam to about 1.5 mg HS DT recent insomnia, lexapro 20, Dayvigo 10 mg HS, concerta 36 mg AM, trazodone 100 mg HS. Patient was administered Spravato 84 mg intranasally today.  The patient experienced the typical dissociation which gradually resolved over the 2-hour period of observation.  There were no complications.  Specifically the patient did not have nausea or vomiting or headache.  Specifically HA is less of a problem with Spravato. No SE with meds. Intensity of Spravato dissociation varies.  Last week very intesne and this week more typical.  Depression is continuing to improve with better interest and activity and motivation.  Gradual improvement.  Wants to continue.  06/25/22 appt noted: Current meds: increased clonazepam to about 1.5 mg HS DT recent insomnia, lexapro 20, Dayvigo 10 mg HS, concerta 36 mg AM, trazodone 100 mg HS. Patient was administered Spravato 84 mg intranasally today.  The patient experienced the typical dissociation which gradually resolved over the 2-hour period of observation.  There were no complications.  Specifically the patient did not have nausea or vomiting or headache.  Specifically HA is less of a problem with Spravato. No SE with meds. Intensity of Spravato dissociation varies.  No scary and well tolerated. Continues to  gradually improve with Spravato.  She is more motivated and enjoying things more.  H sees progress.  Wants to continue twice weekly until gets maximum response.  Dep is not gone yet.  06/30/22 appt noted:  seen with H Current meds: increased clonazepam to about 1.5 mg HS DT recent insomnia, lexapro 20, Dayvigo 10 mg HS, concerta 36 mg AM, trazodone 100 mg HS. Patient was administered Spravato 84 mg intranasally today.  The patient experienced the typical dissociation which gradually resolved over the 2-hour period of observation.  There were no complications.  Specifically the patient did not have nausea or vomiting or headache.  Specifically HA is less of a problem with Spravato. No SE with meds. Intensity of Spravato dissociation varies.  No scary and well tolerated. Continues to gradually improve with Spravato.  She is more motivated and enjoying things more.  H sees even more progress than she does; more active and smiling.  Wants to continue twice weekly until gets maximum response.  Dep is 80% better.  07/07/22 appt noted: Current meds: increased clonazepam to about 1.5 mg HS DT recent insomnia, lexapro 20, Dayvigo 10 mg HS, concerta 36 mg AM, trazodone 100 mg HS. Patient was administered Spravato 84 mg intranasally today.  The patient experienced the typical dissociation which gradually resolved over the 2-hour period of observation.  There were no complications.  Specifically the patient did not have nausea or vomiting or headache.  Specifically  HA is less of a problem with Spravato. No SE with meds. Intensity of Spravato dissociation varies.  No scary and well tolerated.  Was more intese today. Overall mood is markedly better and please with meds. No changes desired.  07/09/22 appt noted: In transition from Lexapro to sertraline and missing Concerta bc CO crash from it after about 4 hours.  Disc these concerns.  She'll discuss also with Corie Chiquito, NP More dep the last couple of days and  concerned about reducing Spravato frequency while transition from one antidep to another.  Affect less positive. Patient was administered Spravato 84 mg intranasally today.  The patient experienced the typical dissociation which gradually resolved over the 2-hour period of observation.  There were no complications.  Specifically the patient did not have nausea or vomiting or headache.  Specifically HA is less of a problem with Spravato. No SE with meds. Intensity of Spravato dissociation varies.  No scary and well tolerated.   07/14/22 appt noted: Has been more depressed this week.  More trouble with sleep.  Taking clonazepam 1-1.5  of the 0.5 mg tablets.   Trazodone not helping much. She switched back from 200 sertraline to Lexapro 20.  No SE More trouble with motivation.  Some stress with son with special needs. Misses the benevit of Concerta but it was too short acting and crashed in 4-6 hours. Patient was administered Spravato 84 mg intranasally today.  The patient experienced the typical dissociation which gradually resolved over the 2-hour period of observation.  There were no complications.  Specifically the patient did not have nausea or vomiting or headache.  Specifically HA is less of a problem with Spravato. No SE with meds. Intensity of Spravato dissociation varies.  No scary and well tolerated.   07/17/22 appt noted: Patient was administered Spravato 84 mg intranasally today.  The patient experienced the typical dissociation which gradually resolved over the 2-hour period of observation.  There were no complications.  Specifically the patient did not have nausea or vomiting or headache.  Specifically HA is less of a problem with Spravato. No SE with meds. Intensity of Spravato dissociation varies.  Not scary and well tolerated.  Just got Jornay last night but wasn't sure how to take it.  Wants to start and this was discussed.  She felt MPH helped mood and attn but didn't last long enough  and had crash after a few hours on Concerta.  Better for 2 hours after each dose Ritalin 20 mg with mood but then it wore off.  BP up after 3rd dose 160/90 and then took H's amlodipine 5 and BP became normal.  07/22/22 appt noted: Patient was administered Spravato 84 mg intranasally today.  The patient experienced the typical dissociation which gradually resolved over the 2-hour period of observation.  There were no complications.  Specifically the patient did not have nausea or vomiting or headache.  Specifically HA is less of a problem with Spravato. No SE with meds. Intensity of Spravato dissociation varies.  Not scary and well tolerated.  Start Jornay 20 mg over the weekend and did not notice any particular effect good or bad.  Increased to 40 mg. Other psych meds: Clonazepam 0.5 mg tablets 2 nightly, gabapentin dosage varies, Dayvigo 10 mg nightly, Lexapro 20 mg daily, to trazodone 100 mg nightly  07/24/22 appt noted: Patient was administered Spravato 84 mg intranasally today.  The patient experienced the typical dissociation which gradually resolved over the 2-hour period of observation.  There were no complications.  Specifically the patient did not have nausea or vomiting or headache.  Specifically HA is less of a problem with Spravato. No SE with meds. Intensity of Spravato dissociation varies.  Not scary and well tolerated.  Other psych meds: Clonazepam 0.5 mg tablets 2 nightly, gabapentin dosage varies, Dayvigo 10 mg nightly, Lexapro 20 mg daily, to trazodone 100 mg nightly, Jornay 40 mg PM Doing well with Spravato.  Noticed a little effect from the Jornay 40 without SE but would like to increase the dose for ADD and mood.  Sleeping well.  No new concerns.  Still more depressed than she was with th initial benefit of Spravato.  07/30/22 note:  Patient was administered Spravato 84 mg intranasally today.  The patient experienced the typical dissociation which gradually resolved over the 2-hour  period of observation.  There were no complications.  Specifically the patient did not have nausea or vomiting or headache.  Specifically HA is less of a problem with Spravato. Other psych meds: Clonazepam 0.5 mg tablets 2 nightly, gabapentin dosage varies, Dayvigo 10 mg nightly, Lexapro 20 mg daily, to trazodone 100 mg nightly, Jornay 60 mg PM Wants to increase Jornay to 80 mg PM to increase benefit for ADD and depression. No SE She has experienced a major family crisis that threatens to change her daily life from now on.  It has not triggered suicidal thoughts at this time but she is very afraid.  No desire to change medications.  08/04/22 appt noted" Patient was administered Spravato 84 mg intranasally today.  The patient experienced the typical dissociation which gradually resolved over the 2-hour period of observation.  There were no complications.  Specifically the patient did not have nausea or vomiting or headache.  Specifically HA is less of a problem with Spravato. Other psych meds: Clonazepam 0.5 mg tablets 2 nightly, gabapentin dosage varies, Dayvigo 10 mg nightly, Lexapro 20 mg daily, trazodone 100 mg nightly, Jornay 80 mg PM No SE No benefit or SE with increase Jormay 80 mg.  Says Concerta helped ADD and depression but not Jornay.  However duration was insufficient.  Still depressed and wants change to another form of stimulant.  No concerns with other meds. Continues with strong family stressors creating uncertainty about her future but no quick resolution. No SI Trial Cotempla 17.3 and DC Ritalin & Jornay 80  08/11/2022 appointment noted: Patient was administered Spravato 84 mg intranasally today.  The patient experienced the typical dissociation which gradually resolved over the 2-hour period of observation.  There were no complications.  Specifically the patient did not have nausea or vomiting or headache.  Specifically HA is less of a problem with Spravato now vs when started  it.. Other psych meds: Clonazepam 0.5 mg tablets 2 nightly, gabapentin dosage varies, Dayvigo 10 mg nightly, trazodone 100 mg nightly, cotempla 17.3, switch from Lexapro 20 to sertaline 15o mg . No noticeable effects sertraline from for depression but has seen some antianxiety effects from the sertraline.  No side effects noted.  No side effects with the other medicines.  Still is only getting very brief benefit 3 to 4 hours from Cotempla for ADD and mood.  She wants to try the Daytrana patch as we have discussed before. No SE  08/13/22 appt: Patient was administered Spravato 84 mg intranasally today.  The patient experienced the typical dissociation which gradually resolved over the 2-hour period of observation.  There were no complications.  Specifically the patient did not  have nausea or vomiting or headache.  Specifically HA is less of a problem with Spravato now vs when started it.. Other psych meds: Clonazepam 0.5 mg tablets 2 nightly, gabapentin dosage varies, Dayvigo 10 mg nightly, trazodone 100 mg nightly, cotempla 17.3, switch from Lexapro 20 to sertaline 15o mg . No noticeable effects sertraline from for depression but has seen some antianxiety effects from the sertraline.  No side effects noted.  No side effects with the other medicines.  Still is only getting very brief benefit 3 to 4 hours from Cotempla for ADD and mood.  She wants to try the Daytrana patch as we have discussed before.  Hasn't been able to get it yet. No SE Plan.  Continue to try to get Daytrana 30 AM  08/18/22 appt noted: Patient was administered Spravato 84 mg intranasally today.  The patient experienced the typical dissociation which gradually resolved over the 2-hour period of observation.  There were no complications.  Specifically the patient did not have nausea or vomiting or headache.  Specifically HA is less of a problem with Spravato now vs when started it.. Other psych meds: Clonazepam 0.5 mg tablets 2 nightly,  gabapentin dosage varies, Dayvigo 10 mg nightly, trazodone 100 mg nightly, cotempla 17.3, switch from Lexapro 20 to sertaline 15o mg . No noticeable effects sertraline from for depression but has seen some antianxiety effects from the sertraline.  No side effects noted.  No side effects with the other medicines.  Still is only getting very brief benefit 3 to 4 hours from Cotempla for ADD and mood.  She wants to try the Daytrana patch as we have discussed before.  Hasn't been able to get it yet.  Overall depression is some better than it was.   No SE Plan.  Continue to try to get Daytrana 30 AM  08/25/22 appt : No benefit Daytrana.  No SE.  Wants further med changes and interested In retrying pramipexole.   Tolerating meds. Patient was administered Spravato 84 mg intranasally today.  The patient experienced the typical dissociation which gradually resolved over the 2-hour period of observation.  There were no complications.  Specifically the patient did not have nausea or vomiting or headache.  Specifically HA is less of a problem with Spravato now vs when started it.. Other psych meds: Clonazepam 0.5 mg tablets 2 nightly, gabapentin dosage varies, Dayvigo 10 mg nightly, trazodone 100 mg nightly, , switch from Lexapro 20 to sertaline 15o mg . Daytrana 30 AM for a few days. Struggles with depression and no SI.  Reduced interest and mood and motivation and enjoyment and socialization.  08/27/22 appt noted: Psych meds:  Clonazepam 0.5 mg tablets 2 nightly, gabapentin dosage varies, Dayvigo 10 mg nightly, trazodone 100 mg nightly,  sertaline 15o mg . Returned to [employer] AM, pramipexole  Tolerating meds. Patient was administered Spravato 84 mg intranasally today.  The patient experienced the typical dissociation which gradually resolved over the 2-hour period of observation.  There were no complications.  Specifically the patient did not have nausea or vomiting or headache.   Reports taking cotempla bc brief  mood benefit and sig focus and productivity benefit. Took 1 dose pramipexole and felt more dep.  09/01/22 appt noted: Psych meds:  Clonazepam 0.5 mg tablets 2 nightly, gabapentin dosage varies, Dayvigo 10 mg nightly, trazodone 100 mg nightly,  sertaline 15o mg . Returned to Cotempla AM, pramipexole  0.25 mg BID. Tolerating meds now. Patient was administered Spravato 84 mg intranasally today.  The patient experienced the typical dissociation which gradually resolved over the 2-hour period of observation.  There were no complications.  Specifically the patient did not have nausea or vomiting or headache.   Willing to try the pramipexole and will continue 0.25 mg BID this week and then consider increase if tolerated.  Mood is a little better.  Still looking for further improvement.  09/03/22 appt noted: Psych meds:  Clonazepam 0.5 mg tablets 2 nightly, gabapentin dosage varies, Dayvigo 10 mg nightly, trazodone 100 mg nightly,  sertaline 15o mg . Returned to Cotempla AM, pramipexole  0.25 mg tablet 1 and 1/2 tablets twice daily BID. Tolerating meds now. Patient was administered Spravato 84 mg intranasally today.  The patient experienced the typical dissociation which gradually resolved over the 2-hour period of observation.  There were no complications.  Specifically the patient did not have nausea or vomiting or headache.   Depression may be a little better with the parmipexole and is tolerating it well so far.  Still struggling with focus and residual depression.  Tolerating meds without SE.  More hopeful and able to enjoy some things.  09/08/22 appt  Psych meds:  Clonazepam 0.5 mg tablets 2 nightly, gabapentin dosage varies, Dayvigo 10 mg nightly, trazodone 100 mg nightly,  sertaline 15o mg . Returned to Cotempla AM, pramipexole  0.5 mg BID. Tolerating meds now. Patient was administered Spravato 84 mg intranasally today.  The patient experienced the typical dissociation which gradually resolved over the  2-hour period of observation.  There were no complications.  Specifically the patient did not have nausea or vomiting or headache.   She has seen her depression drop from a 10/10 prior to treatment with Spravato to now 4/10 With additional benefit noted when she increased the pramipexole to 0.5 mg twice daily.  She is not having side effects with this like she did in the past. Anxiety levels are also improved. She is sleeping okay with the current medications. Plan: continue pramipexole trial off-label and increase more slowly for TRD.  Increase  Gradually  to 0.75 mg BID  09/11/22 appt noted: Psych meds:  Clonazepam 0.5 mg tablets 2 nightly, gabapentin dosage varies, Dayvigo 10 mg nightly, trazodone 100 mg nightly,  sertaline 15o mg . Returned to Cotempla AM, pramipexole  0.75 mg BID. Tolerating meds now. Patient was administered Spravato 84 mg intranasally today.  The patient experienced the typical dissociation which gradually resolved over the 2-hour period of observation.  There were no complications.  Specifically the patient did not have nausea or vomiting or headache.   She has seen her depression drop from a 10/10 prior to treatment with Spravato to now 4/10 With additional benefit noted when she increased the pramipexole to 0.5 mg twice daily.  She is not having side effects with this like she did in the past. Clearly improving with the pramipexole now and tolerating it.  Satisfied with current meds but would consider increasing if it might be helpful.  09/15/22 appt noted: Psych meds:  Clonazepam 0.5 mg tablets 2 nightly, gabapentin dosage varies, Dayvigo 10 mg nightly, trazodone 100 mg nightly,  sertaline 15o mg . Returned to Cotempla AM, pramipexole  0.75 mg BID too anxious and reduced to 0.5 mg BID. Tolerating meds now. Patient was administered Spravato 84 mg intranasally today.  The patient experienced the typical dissociation which gradually resolved over the 2-hour period of  observation.  There were no complications.  Specifically the patient did not have nausea or vomiting  or headache.   Trouble staying asleep longer than 6 hours.  Doesn't feel she can tolerate pramipexole 0.75 mg BID DT anxiety not experienced at  0.5 mg BID.  Otherwise tolerating meds.  Mood is better with Spravato and pramipexole.   Not sleeping as long with meds.  EMA 6 hours.  Wonders about change Disc concerns about waiting for therapy. Feels ready to reduce Spravato to weekly.    09/25/22 appt noted: Psych meds:  Clonazepam 0.5 mg tablets 2 nightly, gabapentin dosage varies, Dayvigo 10 mg nightly, trazodone 100 mg nightly,  sertaline 15o mg . Returned to Cotempla AM, pramipexole  0.75 mg BID too anxious and reduced to 0.5 mg BID. Tolerating meds now. Patient was administered Spravato 84 mg intranasally today.  The patient experienced the typical dissociation which gradually resolved over the 2-hour period of observation.  There were no complications.  Specifically the patient did not have nausea or vomiting or headache.   Trouble staying asleep longer than 6 hours.  Failed to respond to Seroquel 25 mg HS.  Gone back to [facility].  Belsomra NR. Mood ok until the last few days more dep bc it's been 10 days since last admin.  Wants to continue weekly.   No SE.  No med changes desired.  09/29/22 appt noted: Psych meds:  Clonazepam 0.5 mg tablets 2 nightly, gabapentin dosage varies, Dayvigo 10 mg nightly, trazodone 100 mg nightly,  sertaline 15o mg . Returned to Cotempla AM, pramipexole  0.75 mg BID too anxious and reduced to 0.5 mg BID. Tolerating meds now. Patient was administered Spravato 84 mg intranasally today.  The patient experienced the typical dissociation which gradually resolved over the 2-hour period of observation.  There were no complications.  Specifically the patient did not have nausea or vomiting or headache.   Trouble staying asleep longer than 6 hours.  Plan: Continue sertaline to  200 mg daily.  It has helped anxiety but not depression.   for insomnia, clonazepam 1 mg HS. trazodone doesn't work well but helps some Continue Dayvigo 10 HS.  Failed resp Belsomra 15 and seroquel 25 She wants to continue Cotempla or Concerta bc helps mood early in day and focus  benefit lasts longer.  Have not been able to get stimulant to be consitently effective throughout the day. continue pramipexole trial off-label and reduce back to  TRD 0.5 mg BID Spravato weekly.  10/13/22 appt noted: Not sleeping as well.  Wants to increase Spravato to twice weekly bc feeling more dep close to the next sched date.  Just the day or 2 before.  .  Plan: Trial for off label TRD increase pramipexole 1 mg TID  10/20/22 appt noted: Psych meds:  Clonazepam 0.5 mg tablets 2 nightly, gabapentin dosage varies, Dayvigo 10 mg nightly, trazodone 100 mg nightly,  sertaline 15o mg . Returned to Cotempla AM, pramipexole  1 mg TID. Tolerating meds now. Patient was administered Spravato 84 mg intranasally today.  The patient experienced the typical dissociation which gradually resolved over the 2-hour period of observation.  There were no complications.  Specifically the patient did not have nausea or vomiting or headache.   Trouble staying asleep longer than 6 hours.  More dep this week and asked to get Spravato twice this week. Plan: Trial for off label TRD increase pramipexole 1 mg QID  10/22/22 appt noted:  Psych meds:  Clonazepam 0.5 mg tablets 2 nightly and another one with EMA, gabapentin dosage varies, Belsomra 20 mg daily,  trazodone 100 mg nightly,  sertaline 15o mg . Returned to Cotempla AM, pramipexole  1 mg QID for 2nd day Tolerating meds now.  Except a little N and dizzy briefly after takes it. Patient was administered Spravato 84 mg intranasally today.  The patient experienced the typical dissociation which gradually resolved over the 2-hour period of observation.  There were no complications.  Specifically  the patient did not have nausea or vomiting or headache. No change in mood yet.   Sleep better with Belsomra 20 as long as takes with other meds.  If insomnia mood is worse. Ongoing some dep but gets partial relief with Spravato but would like better relief .  10/27/22 received Spravato 84  11/03/22 appt noted:  Meds as above except sertraline she cut to 100 mg instead of 200 mg daily She doesn't think sertraline helping and worries over poss SE Off and on Cotempla .  Not affecting BP. Doesn't notice benefit or SE with pramipexole 1 mg QID.  No SE Sleep is OK.  Some awakening aftter 6 hours but not enough so takes another clonazepam.   Patient was administered Spravato 84 mg intranasally today.  The patient experienced the typical dissociation which gradually resolved over the 2-hour period of observation.  There were no complications.  Specifically the patient did not have nausea or vomiting or headache. No change in mood yet.   Sleep better with Belsomra 20 as long as takes with other meds.  If insomnia mood is worse. Ongoing some dep but gets partial relief with Spravato but would like better relief .  No sig benefit with pramipexole 1 mg QID.  11/21/22 TC:  To ER with dep 11/21/22. No SE with selegiline but feels more dep.  Wants to stop it. Today is only day 2 of 1 tablet of selegiline 5 mg AM.  Reviewed with her that she had not been satisfied with the response of Spravato and Zoloft and that she is likely feeling worse because the Zoloft is wearing off and in particular because she abruptly stopped 150 mg daily.  She needs to give the selegiline time to work.  She agrees to do so.  She admitted to some passive suicidal thoughts but no active suicidal thoughts and no desire to die.  She agrees to the plan to increase selegiline to 10 mg every morning this weekend and to continue the selegiline trial.  She cannot stop this and immediately start another AD due to risk DDI with serotonin syndrome.   She is aware.  Meredith Staggers, MD, DFAPA  12/19/22 TC: Pt called at 1:42p.  She said she wanted to know if she could start weaning off the Selegiline.  She thinks increasing the dosage is making things worse.  She said she wants to go back to [employer].  She acknowleged she has an appt on Monday. I advised her that I wasn't sure Dr Jennelle Human will see her message before her appt, but she still ask me to send the message.  Next appt 8/26 MD resp.  No change until Monday appt.  12/22/22 appt noted: Meds: selegiline up to 5 mg 4 daily for a week but felt it incr irritability and went back to 3 mg daily. She wants to go back to Spravato.  Not near as dep as now.  Selegiline didn't help energy. Still on Belsomra Thinks maybe sertraline helped mood more than she thought. More dep than anxiety.   Plan: DC selegiline and will retry Effexor which  helped in the past at 300 mg daily.  Start this Friday after over 10 half lives of selegiline.    12/24/22 appt noted: Stopped selegiline  Psych med: belsomra 20, clonazepam 1 mg HS.   She has felt more dep off the Spravato though at the time didn't think it was helping.  Has felt more down and withdrawn.  Low energy, motivation, not smiling.  No SI.  Sleep ok.   Patient was administered Spravato 84 mg intranasally today.  The patient experienced the typical dissociation which gradually resolved over the 2-hour period of observation.  There were no complications.  Specifically the patient did not have nausea or vomiting or headache. No problems with BP or other unusual SE Mood is a little better after this administration of Spravato.  Very motivated to continue it.  No immediate med concerns. No SI .  Ongoing anhedonia.  12/25/22 appt noted: Psych med: belsomra 20, clonazepam 1 mg HS.  Not resumed stimulant or AD yet   No SE. Patient was administered Spravato 84 mg intranasally today.  The patient experienced the typical dissociation which gradually resolved over the  2-hour period of observation.  There were no complications.  Specifically the patient did not have nausea or vomiting or headache.  There is a lot things going on in her life and it is somewhat difficult to separate stressors from what is going on with her mood.  However she is happy to resume Spravato as she decided after stopping briefly that it was helpful.  She plans to start the antidepressant Morrow.  She also wants to resume the stimulant tomorrow.  12/30/22 appt noted; Psych med: belsomra 20, clonazepam 1 mg HS.  Not resumed stimulant.  Effexor XR 37.5 mg daily   No SE. Patient was administered Spravato 84 mg intranasally today.  The patient experienced the typical dissociation which gradually resolved over the 2-hour period of observation.  There were no complications.  Specifically the patient did not have nausea or vomiting or headache.  She is still having depression with very prominent anhedonia.  However she feels the Spravato is somewhat helpful and is hopeful for more complete response as she gets back onto a reasonable antidepressant dose.  She tolerated Effexor XR 300 mg daily in the past and took it for 5 months and it is probably the antidepressants worked best for her.  She is agreeable with this plan.  She is getting sleep benefit from the current medications Plan: incr Effexor 75 mg daily  01/02/23 appt noted: Psych meds as noted above except increase Effexor XR to 75 mg daily without side effects. Patient was administered Spravato 84 mg intranasally today.  The patient experienced the typical dissociation which gradually resolved over the 2-hour period of observation.  There were no complications.  Specifically the patient did not have nausea or vomiting or headache.  Agrees to increasing Effexor as planned.  SX not changed from that noted above.  No SI  01/05/23 appt noted: Psych meds as noted above except increase Effexor XR to 112.5 mg daily without side effects. Patient was  administered Spravato 84 mg intranasally today.  The patient experienced the typical dissociation which gradually resolved over the 2-hour period of observation.  There were no complications.  Specifically the patient did not have nausea or vomiting or headache.  Agrees to increasing Effexor as planned gradually to 300 mg daily.  SX not changed from that noted above.  Does however feel Spravato has dampened the  degree of depression.  No SI Stressful life event discussed and won't be resolved until early next year.  01/07/23 appt noted: Psych meds Effexor XR 112.5 mg daily. Patient was administered Spravato 84 mg intranasally today.  The patient experienced the typical dissociation which gradually resolved over the 2-hour period of observation.  There were no complications.  Specifically the patient did not have nausea or vomiting or headache.  Agrees to increasing Effexor as planned gradually to 300 mg daily.  SX not changed from that noted above.  Does however feel Spravato has dampened the degree of depression markedly compared to when off psych meds and no Spravato.  No SI  01/12/23 appt noted; Psych meds Effexor XR 225 mg daily. No SE notedl. Patient was administered Spravato 84 mg intranasally today.  The patient experienced the typical dissociation which gradually resolved over the 2-hour period of observation.  There were no complications.  Specifically the patient did not have nausea or vomiting.  But is having headache.  Agrees to increasing Effexor as planned gradually to 300 mg daily.  SX not changed from that noted above.  Does however feel Spravato has dampened the degree of depression markedly compared to when off psych meds and no Spravato.  No SI  01/15/23 appt noted:  Psych meds Effexor XR 225 mg daily. No SE noted. Patient was administered Spravato 84 mg intranasally today.  The patient experienced the typical dissociation which gradually resolved over the 2-hour period of  observation.  There were no complications.  Specifically the patient did not have nausea or vomiting.   Doing ok with higher doses of Effexor and will plan to increase to 300 mg daily. Mild improvement noticed so far with the incrase in meds.   Uses Cotempla prn and it helps but doesn't liek the way she feels when it wears off.  01/22/23 appt noted: Psych meds reduced Effexor XR 150 mg daily. Bc of HA No SE noted. Patient was administered Spravato 84 mg intranasally today.  The patient experienced the typical dissociation which gradually resolved over the 2-hour period of observation.  There were no complications.  Specifically the patient did not have nausea or vomiting.  Ongoing moderate levels of depression with some improvement from the Spravato.  She is not sure about the effect of the venlafaxine on her mood yet.  She would like to continue to try pursuing a stimulant but has had difficulty tolerating p.o. versions of stimulants because of the crash at the end and side effects with many of them.  She has several questions about this.  01/27/23 appt noted: Psych meds: Clonazepam 1 to 1.5 mg nightly, lithium CR 300 nightly, Belsomra 20 nightly, venlafaxine XR 150 every morning No side effects noted at the current dosage. Patient was administered Spravato 84 mg intranasally today.  The patient experienced the typical dissociation which gradually resolved over the 2-hour period of observation.  There were no complications.  Specifically the patient did not have nausea or vomiting.  Ongoing moderate levels of depression with some improvement from the Spravato.   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  She does not tolerate oral stimulants very well because she has a crash as they wear off which is very dysphoric.  She is willing to retry the Daytrana patches that she took in the spring.  She took 1 patch on 1 occasion recently and  felt it was helpful. However she is  also having problems with early morning awakening over the last week without precipitant.  She would like to have a different treatment strategy for sleep but understands her sleep problems have historically been difficult to resolve and maintain.  02/10/23 appt noted: Psych meds: Clonazepam 1 to 1.5 mg nightly, lithium CR 300 nightly, Belsomra 20 nightly, venlafaxine XR 150 every morning, Daytrana 30 mg Am No side effects noted at the current dosage. Patient was administered Spravato 84 mg intranasally today.  The patient experienced the typical dissociation which gradually resolved over the 2-hour period of observation.  There were no complications.  Specifically the patient did not have nausea or vomiting.  Ongoing moderate levels of depression with some improvement from the Spravato.  Particularly anhedonia.  No sig benefit noted with Daytrana and problems with table stimulants bc gets brief benefit with unpleasant "crash".  Wants to try another type of patch if available.  02/17/23 appt noted: Psych meds: Clonazepam 1 to 1.5 mg nightly, lithium CR 300 nightly, Belsomra 20 nightly, venlafaxine XR 225 mg every morning, Daytrana 30 mg Am No side effects noted at the current dosage. Patient was administered Spravato 84 mg intranasally today.  The patient experienced the typical dissociation which gradually resolved over the 2-hour period of observation.  There were no complications.  Specifically the patient did not have nausea or vomiting.  Ongoing moderate levels of depression with some improvement from the Spravato.  Particularly anhedonia.  She has not gotten the new stimulant yet but is hopeful. No problems with increasing venlafaxine.   Mood is better with Spravato and venlafaxine.  Less anhedonia, less down. Themazepam not helpful.    03/04/23 received Spravato 84 03/12/23 received Spravato 84  03/19/23 appt noted: Psych meds: Clonazepam 1 to 1.5 mg  nightly, lithium CR 300 nightly, Belsomra 20 nightly, venlafaxine XR 300 mg every morning, tried various stimulants lately No side effects noted at the current dosage. Patient was administered Spravato 84 mg intranasally today.  The patient experienced the typical dissociation which gradually resolved over the 2-hour period of observation.  There were no complications.  Specifically the patient did not have nausea or vomiting.  Not getting good duration from oral stimulants.  She would like to try another patch but has to take tabs first and try them before insurance will cover patches.  Overall dep is better with meds and Spravato.  Clearly sees benefit.  03/30/23 appt noted: Psych meds: Clonazepam 1 to 1.5 mg nightly, lithium CR 300 nightly,  venlafaxine XR 300 mg every morning, tried various stimulants lately, gabapentin 900 mg HS, dexmethylphenidate ER without help No side effects noted at the current dosage. Patient was administered Spravato 84 mg intranasally today.  The patient experienced the typical dissociation which gradually resolved over the 2-hour period of observation.  There were no complications.  Specifically the patient did not have nausea or vomiting.  Able to leave at end of 2 hour period without assistance.  Clear mood benefit with Spravato with much less dep and wants to continue. Still trouble sleeping. TR insomnia.  Ongoing conc problems with minimal brief benefit from stimulants.  Not able to get patch Caprice Red yet DT insurance.  04/14/23 appt noted: Psych meds: Clonazepam 1 to 1.5 mg nightly, lithium CR 300 nightly,  she reduced on her own venlafaxine XR to 150 mg every morning, tried various stimulants lately, gabapentin 900 mg HS, dexmethylphenidate ER without help No side effects noted at the current dosage. Patient was administered Spravato 84  mg intranasally today.  The patient experienced the typical dissociation which gradually resolved over the 2-hour period of  observation.  There were no complications.  Specifically the patient did not have nausea or vomiting.  Able to leave at end of 2 hour period without assistance.  Clear mood benefit with Spravato with much less dep and wants to continue. she went to [facility] and had her brain scanned at [facility]. they recommend she taper off Effexor which she is only taking 150 mg currently and start Auvelity. She reduce Effexor on her own to 150 mg daily. She wants further improvement in anhedonia.    04/28/23 received Spravato 84  05/11/23 appt noted: Psych meds: Clonazepam 1 to 1.5 mg nightly, lithium CR 300 nightly,  venlafaxine XR to 150 mg every morning, Auvelity BID, tried various stimulants lately, gabapentin 900 mg HS, dexmethylphenidate ER without help No side effects noted at the current dosage. Patient was administered Spravato 84 mg intranasally today.  The patient experienced the typical dissociation which gradually resolved over the 2-hour period of observation.  There were no complications.  Specifically the patient did not have nausea or vomiting.  Able to leave at end of 2 hour period without assistance.  Clear mood benefit with Spravato with much less dep and wants to continue. Overall her mood is still significantly better with the combination of Spravato venlafaxine and Auvelity in particular.  She has not had any problems from the reduction in venlafaxine but is not dramatically improved with regard to residual depression since adding Auvelity but she is not having side effects.  We agreed that a longer trial of the helpful.  She is also wondering about increasing the Spravato frequency from once every other week to once every 10 days in order to prove improved residual depression and specifically anhedonia.  Overall though she is dramatically better with the current combination of meds. Plan: continue venlafaxine ER 150 with Auvelity BID bc TRD  05/25/23 appt noted: Psych meds: Clonazepam 1 to  1.5 mg nightly, lithium CR 300 nightly,  venlafaxine XR to 150 mg every morning, Auvelity BID, tried various stimulants lately, gabapentin 900 mg HS, dexmethylphenidate ER without help No side effects noted at the current dosage. Patient was administered Spravato 84 mg intranasally today.  The patient experienced the typical dissociation which gradually resolved over the 2-hour period of observation.  There were no complications.  Specifically the patient did not have nausea or vomiting.  Able to leave at end of 2 hour period without assistance.  Clear mood benefit with Spravato with much less dep and wants to continue.  06/04/23 appt noted: Psych meds: Clonazepam 1 to 1.5 mg nightly, lithium CR 300 nightly,  venlafaxine XR to 150 mg every morning, Auvelity 1 in the AM, tried various stimulants lately, gabapentin 900 mg HS, no current stimulant (Vyvanse 40 NR) She reduced Auvelity to 1 daily DT tremor she thinks it made worse and bc no benefit noted with it. She is interested in increasing the Effexor at 300 mg daily bc did get some benefit from it in the past and tolerated that dos. Chronic dep is better with Spravato but not gone.  Some anhedonia.  Focus is still not good. Problems getting patch ADD med. Patient was administered Spravato 84 mg intranasally today.  The patient experienced the typical dissociation which gradually resolved over the 2-hour period of observation.  There were no complications.  Specifically the patient did not have nausea or vomiting.  Able  to leave at end of 2 hour period without assistance.  Clear mood benefit with Spravato with much less dep and wants to continue.  06/16/23 appt noted: Psych meds: Clonazepam 1 to 1.5 mg nightly, lithium CR 300 nightly,  venlafaxine XR didn't yet increase so  225 mg every morning, , tried various stimulants lately, gabapentin 900 mg HS, no current stimulant (Vyvanse 40 NR), trazodone 50 HS Chronic dep is better with Spravato but not gone.   Some anhedonia.  Focus is still not good. Problems getting patch ADD med. Patient was administered Spravato 84 mg intranasally today.  The patient experienced the typical dissociation which gradually resolved over the 2-hour period of observation.  There were no complications.  Specifically the patient did not have nausea or vomiting.  Able to leave at end of 2 hour period without assistance.  Clear mood benefit with Spravato with much less dep and wants to continue. She has not increased venlafaxine to 300 mg yet but is eager to do so.  She still has some sleep issues and wonders if trazodone is working.  She has no side effect with current medications.  She would like to try an alternative med for ADD since the stimulants have not been effective. Plan: increase venlafaxine XR to 300 mg every morning  06/29/23 appt noted: Med: Clonazepam 1 to 1.5 mg nightly, lithium CR 300 nightly,  venlafaxine XR 300 mg every morning, , tried various stimulants lately, gabapentin 900 mg HS, no current stimulant (Vyvanse 40 NR), trazodone 50 HS No SE Patient was administered Spravato 84 mg intranasally today.  The patient experienced the typical dissociation which gradually resolved over the 2-hour period of observation.  There were no complications.  Specifically the patient did not have nausea or vomiting.  Able to leave at end of 2 hour period without assistance.  Clear mood benefit with Spravato with much less dep and wants to continue.  07/10/23 appt noted: Med: Clonazepam 1 to 1.5 mg nightly, lithium CR 300 nightly,  venlafaxine XR 300 mg every morning, , tried various stimulants lately, gabapentin 900 mg HS, no current stimulant , trazodone 50 HS No SE Patient was administered Spravato 84 mg intranasally today.  The patient experienced the typical dissociation which gradually resolved over the 2-hour period of observation.  There were no complications.  Specifically the patient did not have nausea or vomiting.   Able to leave at end of 2 hour period without assistance.  Partial response with Spravato is sig but not complete.  Disc questions abotu stimulant vs Strattera to help more with dep and ADD and productivity.  AIMS    Flowsheet Row Video Visit from 01/03/2022 in [facility]  AIMS Total Score 0      ECT-MADRS    Flowsheet Row Clinical Support from 08/13/2022 in [facility] Video Visit from 04/29/2022 in [facility] Video Visit from 02/06/2022 in [facility]  MADRS Total Score 27 [phone] 23      GAD-7    Flowsheet Row Counselor from 12/19/2022 in [facility] Office Visit from 08/22/2022 in [facility] [place]  Total GAD-7 Score 4 7      PHQ2-9    Flowsheet Row Counselor from 12/19/2022 in [facility] Office Visit from 08/22/2022 in [facility] [place] Office Visit from 08/19/2022 in [facility] for [facility] at [facility]  Video Visit from 02/06/2022 in [facility] Office Visit from 11/16/2020 in [facility] for [facility] at [facility]  PHQ-2 Total Score 2 3 0 6 0  PHQ-9 Total Score 5 5 -- 14 --        Past Psychiatric Medication Trials: Trintellix - Initially effective and then was not effective when re-started Paxil- Ineffective. Helped initially. Prozac-Insomnia Lexapro- Effective and then no longer as effective Zoloft- Initially effective and then minimal response Viibryd Cymbalta- Ineffective. Helped initially. Effexor XR 300- Effective, well tolerated. Pristiq- Increased anxiety at 150 mg daily Wellbutrin XL-Ineffective.May have increased anxiety. Amitriptyline Nortriptyline-Caused irritability, constipation Auvelity- ineffective 2 trials Selegilne brief 20 mg daily , more  irritable  Abilify- Helpful but caused severe insomnia. Rexulti-Helpful but caused insomnia. Vraylar Seroquel - Ineffective Risperdal Olanzapine- Ineffective Latuda- Ineffective Caplyta  Klonopin- Effective Temazepam- Took during menopause Lunesta- Ineffective Ambien-Ineffective Sonata- Ineffective Dayvigo- partially effective  Belsomra 20 worked and then failed  Trazodone-  somewhat effective Remeron-Ineffective. Does not recall wt gain.  Quetiapine 25 NR  Adderall- Took once and had adverse effect. Concerta- Effective but only 4-6 hours Jornay 80 NR Metadate-Not as effective as Concerta Dexmethylphenidate IR NR Dexmethylphenidate ER 10/02/21- Not as effective as Concerta Azstarys- some side effects. Not as effective.  Ritalin short acting and SE anxiety & HTN @20  TID Cotempla 17.3  Daytrana NR Vyvanse 40 NR  Lamictal- May have had an adverse effects. "Felt weird." Reports worsening s/s.  Lithium ? effect Gabapentin Pramipexole- 1 mg QID NR  ECT #20 with minimal response. H says it was partly helpful.  AIMS    Flowsheet Row Video Visit from 01/03/2022 in [facility]  AIMS Total Score 0      ECT-MADRS    Flowsheet Row Clinical Support from 08/13/2022 in [facility] Video Visit from 04/29/2022 in [facility] Video Visit from 02/06/2022 in [facility]  MADRS Total Score 27 [phone]      GAD-7    Flowsheet Row Counselor from 12/19/2022 in [facility] Office Visit from 08/22/2022 in [facility] [place]  Total GAD-7 Score 4 7      PHQ2-9    Flowsheet Row Counselor from 12/19/2022 in [facility] Office Visit from 08/22/2022 in [facility] [place] Office Visit from 08/19/2022 in [facility] for [facility] Video Visit  from 02/06/2022 in [facility] Office Visit from 11/16/2020 in [facility] for [facility] Total Score 2 3 0 6 0  PHQ-9 Total Score 5 5 -- 14 --        Review of Systems:  Review of Systems  Constitutional:  Positive for fatigue.  HENT:  Negative for dental problem.   Cardiovascular:  Negative for palpitations.  Neurological:  Positive for tremors. Negative for dizziness and headaches.  Psychiatric/Behavioral:  Positive for decreased concentration, dysphoric mood and sleep disturbance. Negative for agitation, behavioral problems, confusion, hallucinations and suicidal ideas. The patient is nervous/anxious.     Medications: I have reviewed the patient's current medications.  Current Outpatient Medications  Medication Sig Dispense Refill   clonazePAM (KLONOPIN) 1 MG tablet Take 1-2 tablets (1-2 mg total) by mouth at bedtime. 60 tablet 0   Esketamine HCl, 84 MG Dose, (SPRAVATO, 84 MG DOSE,) 28 MG/DEVICE SOPK USE 3 SPRAYS  IN EACH NOSTRIL ONCE A WEEK 3 each 1   Estradiol (VAGIFEM) 10 MCG TABS vaginal tablet Place 1 tablet (10 mcg total) vaginally 2 (two) times a week. 24 tablet 3   gabapentin (NEURONTIN) 300 MG capsule 2 at bedtime and 1-2 capsule if need for early morning awakening. 120 capsule 1   lithium carbonate (LITHOBID) 300 MG ER tablet TAKE 2 TABLETS BY MOUTH AT BEDTIME 180 tablet 0   traZODone (DESYREL) 50 MG tablet TAKE 1 TABLET BY MOUTH AT BEDTIME 30 tablet 0   valACYclovir (VALTREX) 500 MG tablet Take one twice daily for 3 days with any symptoms 30 tablet 3   valsartan (DIOVAN) 80 MG tablet Take 1 tablet (80 mg total) by mouth daily. 90 tablet 3   venlafaxine XR (EFFEXOR-XR) 75 MG 24 hr capsule Take 3 capsules (225 mg total) by mouth daily with breakfast. (Patient taking differently: Take 300 mg by mouth daily with breakfast.) 90 capsule 1   atomoxetine (STRATTERA) 25 MG capsule 1 daily for 4 days then 2 daily  for 4 days, then 3 daily (Patient not taking: Reported on 07/10/2023) 90 capsule 0   Dextroamphetamine (XELSTRYM) 13.5 MG/9HR PTCH Place 1 patch onto the skin every morning. (Patient not taking: Reported on 07/10/2023) 30 patch 0   No current facility-administered medications for this visit.    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   TRANSAMINASES, SERUM, ELEVATED 08/14/2009    Past Medical History, Surgical history, Social history, and Family history were reviewed and updated as appropriate.   Please see review of systems for further details on the patient's review from today.   Objective:   Physical Exam:  LMP 12/27/2007 (Exact Date)   Physical Exam Constitutional:      General: She is not in acute distress. Musculoskeletal:        General: No deformity.  Neurological:     Mental Status: She is alert and oriented to person, place, and time.     Motor: Tremor present.     Coordination: Coordination normal.     Comments: Minimal tremor  Psychiatric:        Attention and Perception: Perception normal. She is inattentive. She does not perceive auditory hallucinations.        Mood and Affect: Mood is anxious and depressed. Affect is blunt. Affect is not tearful.        Speech: Speech normal. Speech is not rapid and pressured.        Behavior: Behavior normal.        Thought Content: Thought content normal. Thought content is not delusional. Thought content does not include homicidal or suicidal ideation. Thought content does not include suicidal plan.        Cognition and Memory: Cognition and memory normal.        Judgment:  Judgment normal.     Comments: Insight intact 4/10 dep but better with Spravato markedly. Affect blunted chronically     Lab Review:     Component Value Date/Time   NA 132 (L) 10/27/2022 1107   K 4.4  10/27/2022 1107   CL 95 (L) 10/27/2022 1107   CO2 30 10/27/2022 1107   GLUCOSE 89 10/27/2022 1107   BUN 14 10/27/2022 1107   CREATININE 0.67 10/27/2022 1107   CREATININE 0.68 01/26/2020 0920   CALCIUM 10.1 10/27/2022 1107   PROT 8.3 04/22/2022 0932   ALBUMIN 5.3 (H) 04/22/2022 0932   AST 82 (H) 04/22/2022 0932   ALT 118 (H) 04/22/2022 0932   ALKPHOS 74 04/22/2022 0932   BILITOT 0.3 04/22/2022 0932   GFRNONAA >90 05/01/2014 1645   GFRAA >90 05/01/2014 1645       Component Value Date/Time   WBC 3.3 (L) 04/22/2022 0932   RBC 4.25 04/22/2022 0932   HGB 14.3 04/22/2022 0932   HGB 14.1 10/26/2014 1034   HCT 40.7 04/22/2022 0932   PLT 245.0 04/22/2022 0932   MCV 95.7 04/22/2022 0932   MCH 34.4 (H) 01/26/2020 0920   MCHC 35.1 04/22/2022 0932   RDW 13.2 04/22/2022 0932   LYMPHSABS 0.9 04/22/2022 0932   MONOABS 0.2 04/22/2022 0932   EOSABS 0.1 04/22/2022 0932   BASOSABS 0.0 04/22/2022 0932    No results found for: "POCLITH", "LITHIUM"   No results found for: "PHENYTOIN", "PHENOBARB", "VALPROATE", "CBMZ"   .res Assessment: Plan:    Emmie was seen today for follow-up, depression and anxiety.  Diagnoses and all orders for this visit:  Recurrent major depression resistant to treatment (HCC)  Generalized anxiety disorder  Attention deficit hyperactivity disorder (ADHD), predominantly inattentive type  Insomnia, unspecified type    Pt seen for TRD with multiple failed medications as noted above.    Mood was reportedly better with venlafaxine with Spravato but ongoing dep with anhedonia and fatigue.    There is an ongoing pattern of her making impulsive decisions on her own about meds which are counter therapeutic and not informing MD of what is going on until after  th fact.  That is not a helpful pattern and she is encouraged to stop it.  Patient was administered Spravato 84 mg intranasally today.  The patient experienced the typical dissociation which gradually resolved over the 2-hour period of observation.  There were no complications.  Specifically the patient did not have nausea or vomiting or headache.  Blood pressures remained within normal ranges at the 40-minute and 2-hour follow-up intervals.  By the time the 2-hour observation period was met the patient was alert and oriented and able to exit without assistance.  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  See nursing note for further details. Would like to increase frequency from every other week to every 10 days looking for further improvement in depression.  Consider switch Spravato to MAOI, Parnate  We discussed the short-term risks associated with benzodiazepines including sedation and increased fall risk among others.  Discussed long-term side effect risk including dependence, potential withdrawal symptoms, and the potential eventual dose-related risk of dementia.  But recent studies from 2020 dispute this association between benzodiazepines and dementia risk. Newer studies in 2020 do not support an association with dementia.  for insomnia, wants to continue clonazepam 2 mg nightly. Can't sleep without it. Wants to continue trazodone also  Reports needing gabapentin [phone] mg HS to help sleep.  Cotempla or Concerta bc helps mood early in day and focus  benefit lasts longer.  Have not been able to get stimulant to be consitently effective throughout the day. Conisder switching to patch bc pt  has significant crash with stimulants or perhaps [place].  Stimulant tablets cause crash with inadequate duration .  Patches should provide smoother response.  Will try another patch, Noemi Chapel if we can get it  approved.   Insurance denied [place] for this medication stating she has to try dextroamphetamine ER, Adderall XR, and Vyvanse before will be considered. Have been reluctant to try Adderall XR because she did not tolerate Adderall at home.     Discussed potential benefits, risks, and side effects of stimulants with patient to include increased heart rate, palpitations, insomnia, increased anxiety, increased irritability, or decreased appetite.  Instructed patient to contact office if experiencing any significant tolerability issues.  To max opportunity to respond for tRD, lithium 300 mg PM.  Disc pretreating Spravato with NSAID or Tylenol bc of HA lately.  Supportive therapy dealing with serious stressful life event.  Plan: continue Clonazepam 1 to 1.5 mg nightly, lithium CR 300 nightly, increase venlafaxine XR to 300 mg every morning,  tried various stimulants lately, gabapentin 900 mg HS, trazodone 50 HS.  Consider Qelbree or strattera for treatment resistant ADD but first need to make sure she tolerates the venlafaxine because of potential interactions.  Has not seen benefit from stimulants so far.  Disc risk noradrenergic SE with the combo.  She generally  has high tolerance.   Disc questions about stimulant vs Strattera to help more with dep and ADD and productivity. Strattera 25 mg daily and increase to 75 mg daily.  FU every 14days  Meredith Staggers, MD, DFAPA    Please see After Visit Summary for patient specific instructions.  Future Appointments  Date Time Provider Department Center  07/22/2023  1:00 PM Cottle, Steva Ready., MD [facility] None  07/22/2023  1:00 PM CP-NURSE [facility] None  08/21/2023  8:00 AM [name], MD [facility]  09/18/2023  8:15 AM Jerene Bears, MD [facility]    No orders of the defined types were placed in this encounter.   -------------------------------

## 2023-07-13 ENCOUNTER — Encounter: Payer: 59 | Admitting: Psychiatry

## 2023-07-15 ENCOUNTER — Other Ambulatory Visit: Payer: Self-pay

## 2023-07-15 ENCOUNTER — Telehealth: Payer: Self-pay

## 2023-07-15 DIAGNOSIS — F900 Attention-deficit hyperactivity disorder, predominantly inattentive type: Secondary | ICD-10-CM

## 2023-07-15 MED ORDER — XELSTRYM 13.5 MG/9HR TD PTCH: 1.0000 | MEDICATED_PATCH | Freq: Every morning | TRANSDERMAL | 0 refills | Status: DC

## 2023-07-15 NOTE — Telephone Encounter (Signed)
Now approved by insurance

## 2023-07-15 NOTE — Telephone Encounter (Signed)
 Prior Authorization submitted for [facility] 13.5mg /9 hour patch, denied when initially sent a few months ago. Tried other medications and is not approved with [employer] effective 06/09/2023-07/13/2024 PA# [id number]

## 2023-07-22 ENCOUNTER — Encounter: Payer: Self-pay | Admitting: Psychiatry

## 2023-07-22 ENCOUNTER — Ambulatory Visit (INDEPENDENT_AMBULATORY_CARE_PROVIDER_SITE_OTHER): Admitting: Psychiatry

## 2023-07-22 ENCOUNTER — Ambulatory Visit

## 2023-07-22 VITALS — BP 116/83 | HR 80

## 2023-07-22 DIAGNOSIS — F411 Generalized anxiety disorder: Secondary | ICD-10-CM

## 2023-07-22 DIAGNOSIS — F339 Major depressive disorder, recurrent, unspecified: Secondary | ICD-10-CM

## 2023-07-22 DIAGNOSIS — F900 Attention-deficit hyperactivity disorder, predominantly inattentive type: Secondary | ICD-10-CM | POA: Diagnosis not present

## 2023-07-22 DIAGNOSIS — G251 Drug-induced tremor: Secondary | ICD-10-CM

## 2023-07-22 DIAGNOSIS — G4700 Insomnia, unspecified: Secondary | ICD-10-CM | POA: Diagnosis not present

## 2023-07-22 NOTE — Progress Notes (Signed)
 NURSES NOTE:           Patient arrived for her #41 Spravato treatment. Pt is coming every other week now. Pt is being treated for Treatment Resistant Depression, pt will be receiving 84 mg (3 of the 28 mg) nasal sprays, this is her maintenance dose. Patient taken to treatment room, I explained and discussed her treatment and how the schedule should go, along with any side effects that may occur. Answered any questions and concerns the patient had. Pt's Spravato is delivered through [place] and she is now billing with her insurance. Spravato medication is stored at doctors office per REMS/FDA guidelines. The medication is required to be locked behind two doors per FDA/REMS Protocol. Medication is also disposed of properly per regulations. All treatments and her vital signs are documented in Spravato REMS per protocol of treatment center regulations. [facility] delivers her medication.      Vital Signs assessed at 1:00 PM 129/74, pulse 80, SpO2 95%. Instructed pt to blow her nose and recline back slightly to prevent any of medication dripping out of her nose.   Pt. Given 1st dose (28 mg inhaler), then 5 minutes between 2nd dose and 3rd dose for total of 84 mg. No complaints of nausea/vomiting reported. Pt did listen to music today, she just reclined back and closed her eyes. After 40 minutes I went to assess her vital signs, no side effects or symptoms reported, B/P 116/78, pulse 81, SpO2 96%.  Dr. Jennelle Human talked to pt today about her treatment. Nurse was with pt a total of 60 minutes but pt observed for 120 minutes per protocol. Discharge vital signs were at 2:55 PM, B/P 116/83, pulse 80, SpO2 95%. She verbalized understanding. She reports dissociation but she was clear upon her discharge. She is instructed to contact office if needing anything prior to her next treatment.  She is scheduled on April 9th.    LOT # [id number]   EXP Y [id number]

## 2023-07-22 NOTE — Progress Notes (Signed)
 Heather Davila [id number] 02-01-1961 63 y.o.  Subjective:   Patient ID:  Heather Davila is a 63 y.o. (DOB Dec 22, 1960) female.  Chief Complaint:  Chief Complaint  Patient presents with   Follow-up   Depression   Anxiety   Medication Problem   ADD   Sleeping Problem    HPI Heather Davila presents to the office today for follow-up of treatment resistant recurrent depression, generalized anxiety disorder and insomnia.  Almost too numerous to count med failures. She is here for Spravato administration for her treatment resistant depression.  05/21/22 appt noted: Patient was administered first dose Spravato 56 mg intranasally today.  The patient experienced the typical dissociation which gradually resolved over the 2-hour period of observation.  There were no complications.  Specifically the patient did not have nausea or vomiting or headache.   Dissociation was mild. Did not experience any emotional relief from disabling depression.wants to increase Spravato to the usual dose.  She is aware insurance is not covering the costs at this time.  No SI acutely. Tolerating meds.    05/23/22 appt noted: Patient was administered Spravato 84 mg intranasally today.  The patient experienced the typical dissociation which gradually resolved over the 2-hour period of observation.  There were no complications.  Specifically the patient did not have nausea or vomiting.   Dissociation was greater than last dose and a little bothersome DT some HA. Tolerating meds.   Mood has not changed significantly thus far with Spravato.  05/27/22 appt noted: Patient was administered Spravato 84 mg intranasally today.  The patient experienced the typical dissociation which gradually resolved over the 2-hour period of observation.  There were no complications.  Specifically the patient did not have nausea or vomiting or headache. Today she has felt a little relief from the pressing heaviness of depression but a little more  anxiety.  During administration of Spravato she listens to [place] music and was able to relax a little more with the feeling of dissociation that was mildly bothersome. Husband was also in on the session today and asked some questions about IV ketamine versus nasal spray ketamine.  05/29/22 appt noted: Cancelled today DT hypertension  05/30/22 appt noted: Patient was administered Spravato 84 mg intranasally today.  The patient experienced the typical dissociation which gradually resolved over the 2-hour period of observation.  There were no complications.  Specifically the patient did not have nausea or vomiting or headache. She is seeing some improvement in mood with less continuous depression and less intensity.  Hopefulness is better.   No med complaints or SE  06/05/22 appt noted: Patient was administered Spravato 84 mg intranasally today.  The patient experienced the typical dissociation which gradually resolved over the 2-hour period of observation.  There were no complications.  Specifically the patient did not have nausea or vomiting or headache.  Specifically HA is less of a problem with Spravato. No SE with meds. Depression is improving with less intensity.  Intermittent anxiety easily.  More able to enjoy things.  No new concerns.  06/17/22 appt noted: Patient was administered Spravato 84 mg intranasally today.  The patient experienced the typical dissociation which gradually resolved over the 2-hour period of observation.  There were no complications.  Specifically the patient did not have nausea or vomiting or headache.  Specifically HA is less of a problem with Spravato. No SE with meds. Depression is improved 45% with less intensity.  Intermittent anxiety less easily.  More able to enjoy things.  No  new concerns.  More active. Current meds: increased clonazepam to about 1.5 mg HS DT recent insomnia, lexapro 20, Dayvigo 10 mg HS, concerta 36 mg AM, trazodone 100 mg HS.  06/20/22 appt  noted: Current meds: increased clonazepam to about 1.5 mg HS DT recent insomnia, lexapro 20, Dayvigo 10 mg HS, concerta 36 mg AM, trazodone 100 mg HS. Patient was administered Spravato 84 mg intranasally today.  The patient experienced the typical dissociation which gradually resolved over the 2-hour period of observation.  There were no complications.  Specifically the patient did not have nausea or vomiting or headache.  Specifically HA is less of a problem with Spravato. No SE with meds. Depression is improved 45% with less intensity.  Intermittent anxiety less easily.  More able to enjoy things.  No new concerns.  More active.  06/23/22 appt noted:  Current meds: increased clonazepam to about 1.5 mg HS DT recent insomnia, lexapro 20, Dayvigo 10 mg HS, concerta 36 mg AM, trazodone 100 mg HS. Patient was administered Spravato 84 mg intranasally today.  The patient experienced the typical dissociation which gradually resolved over the 2-hour period of observation.  There were no complications.  Specifically the patient did not have nausea or vomiting or headache.  Specifically HA is less of a problem with Spravato. No SE with meds. Intensity of Spravato dissociation varies.  Last week very intesne and this week more typical.  Depression is continuing to improve with better interest and activity and motivation.  Gradual improvement.  Wants to continue.  06/25/22 appt noted: Current meds: increased clonazepam to about 1.5 mg HS DT recent insomnia, lexapro 20, Dayvigo 10 mg HS, concerta 36 mg AM, trazodone 100 mg HS. Patient was administered Spravato 84 mg intranasally today.  The patient experienced the typical dissociation which gradually resolved over the 2-hour period of observation.  There were no complications.  Specifically the patient did not have nausea or vomiting or headache.  Specifically HA is less of a problem with Spravato. No SE with meds. Intensity of Spravato dissociation varies.  No  scary and well tolerated. Continues to gradually improve with Spravato.  She is more motivated and enjoying things more.  H sees progress.  Wants to continue twice weekly until gets maximum response.  Dep is not gone yet.  06/30/22 appt noted:  seen with H Current meds: increased clonazepam to about 1.5 mg HS DT recent insomnia, lexapro 20, Dayvigo 10 mg HS, concerta 36 mg AM, trazodone 100 mg HS. Patient was administered Spravato 84 mg intranasally today.  The patient experienced the typical dissociation which gradually resolved over the 2-hour period of observation.  There were no complications.  Specifically the patient did not have nausea or vomiting or headache.  Specifically HA is less of a problem with Spravato. No SE with meds. Intensity of Spravato dissociation varies.  No scary and well tolerated. Continues to gradually improve with Spravato.  She is more motivated and enjoying things more.  H sees even more progress than she does; more active and smiling.  Wants to continue twice weekly until gets maximum response.  Dep is 80% better.  07/07/22 appt noted: Current meds: increased clonazepam to about 1.5 mg HS DT recent insomnia, lexapro 20, Dayvigo 10 mg HS, concerta 36 mg AM, trazodone 100 mg HS. Patient was administered Spravato 84 mg intranasally today.  The patient experienced the typical dissociation which gradually resolved over the 2-hour period of observation.  There were no complications.  Specifically the  patient did not have nausea or vomiting or headache.  Specifically HA is less of a problem with Spravato. No SE with meds. Intensity of Spravato dissociation varies.  No scary and well tolerated.  Was more intese today. Overall mood is markedly better and please with meds. No changes desired.  07/09/22 appt noted: In transition from Lexapro to sertraline and missing Concerta bc CO crash from it after about 4 hours.  Disc these concerns.  She'll discuss also with Corie Chiquito,  NP More dep the last couple of days and concerned about reducing Spravato frequency while transition from one antidep to another.  Affect less positive. Patient was administered Spravato 84 mg intranasally today.  The patient experienced the typical dissociation which gradually resolved over the 2-hour period of observation.  There were no complications.  Specifically the patient did not have nausea or vomiting or headache.  Specifically HA is less of a problem with Spravato. No SE with meds. Intensity of Spravato dissociation varies.  No scary and well tolerated.   07/14/22 appt noted: Has been more depressed this week.  More trouble with sleep.  Taking clonazepam 1-1.5  of the 0.5 mg tablets.   Trazodone not helping much. She switched back from 200 sertraline to Lexapro 20.  No SE More trouble with motivation.  Some stress with son with special needs. Misses the benevit of Concerta but it was too short acting and crashed in 4-6 hours. Patient was administered Spravato 84 mg intranasally today.  The patient experienced the typical dissociation which gradually resolved over the 2-hour period of observation.  There were no complications.  Specifically the patient did not have nausea or vomiting or headache.  Specifically HA is less of a problem with Spravato. No SE with meds. Intensity of Spravato dissociation varies.  No scary and well tolerated.   07/17/22 appt noted: Patient was administered Spravato 84 mg intranasally today.  The patient experienced the typical dissociation which gradually resolved over the 2-hour period of observation.  There were no complications.  Specifically the patient did not have nausea or vomiting or headache.  Specifically HA is less of a problem with Spravato. No SE with meds. Intensity of Spravato dissociation varies.  Not scary and well tolerated.  Just got Jornay last night but wasn't sure how to take it.  Wants to start and this was discussed.  She felt MPH helped  mood and attn but didn't last long enough and had crash after a few hours on Concerta.  Better for 2 hours after each dose Ritalin 20 mg with mood but then it wore off.  BP up after 3rd dose 160/90 and then took H's amlodipine 5 and BP became normal.  07/22/22 appt noted: Patient was administered Spravato 84 mg intranasally today.  The patient experienced the typical dissociation which gradually resolved over the 2-hour period of observation.  There were no complications.  Specifically the patient did not have nausea or vomiting or headache.  Specifically HA is less of a problem with Spravato. No SE with meds. Intensity of Spravato dissociation varies.  Not scary and well tolerated.  Start Jornay 20 mg over the weekend and did not notice any particular effect good or bad.  Increased to 40 mg. Other psych meds: Clonazepam 0.5 mg tablets 2 nightly, gabapentin dosage varies, Dayvigo 10 mg nightly, Lexapro 20 mg daily, to trazodone 100 mg nightly  07/24/22 appt noted: Patient was administered Spravato 84 mg intranasally today.  The patient experienced the typical  dissociation which gradually resolved over the 2-hour period of observation.  There were no complications.  Specifically the patient did not have nausea or vomiting or headache.  Specifically HA is less of a problem with Spravato. No SE with meds. Intensity of Spravato dissociation varies.  Not scary and well tolerated.  Other psych meds: Clonazepam 0.5 mg tablets 2 nightly, gabapentin dosage varies, Dayvigo 10 mg nightly, Lexapro 20 mg daily, to trazodone 100 mg nightly, Jornay 40 mg PM Doing well with Spravato.  Noticed a little effect from the Jornay 40 without SE but would like to increase the dose for ADD and mood.  Sleeping well.  No new concerns.  Still more depressed than she was with th initial benefit of Spravato.  07/30/22 note:  Patient was administered Spravato 84 mg intranasally today.  The patient experienced the typical  dissociation which gradually resolved over the 2-hour period of observation.  There were no complications.  Specifically the patient did not have nausea or vomiting or headache.  Specifically HA is less of a problem with Spravato. Other psych meds: Clonazepam 0.5 mg tablets 2 nightly, gabapentin dosage varies, Dayvigo 10 mg nightly, Lexapro 20 mg daily, to trazodone 100 mg nightly, Jornay 60 mg PM Wants to increase Jornay to 80 mg PM to increase benefit for ADD and depression. No SE She has experienced a major family crisis that threatens to change her daily life from now on.  It has not triggered suicidal thoughts at this time but she is very afraid.  No desire to change medications.  08/04/22 appt noted" Patient was administered Spravato 84 mg intranasally today.  The patient experienced the typical dissociation which gradually resolved over the 2-hour period of observation.  There were no complications.  Specifically the patient did not have nausea or vomiting or headache.  Specifically HA is less of a problem with Spravato. Other psych meds: Clonazepam 0.5 mg tablets 2 nightly, gabapentin dosage varies, Dayvigo 10 mg nightly, Lexapro 20 mg daily, trazodone 100 mg nightly, Jornay 80 mg PM No SE No benefit or SE with increase Jormay 80 mg.  Says Concerta helped ADD and depression but not Jornay.  However duration was insufficient.  Still depressed and wants change to another form of stimulant.  No concerns with other meds. Continues with strong family stressors creating uncertainty about her future but no quick resolution. No SI Trial Cotempla 17.3 and DC Ritalin & Jornay 80  08/11/2022 appointment noted: Patient was administered Spravato 84 mg intranasally today.  The patient experienced the typical dissociation which gradually resolved over the 2-hour period of observation.  There were no complications.  Specifically the patient did not have nausea or vomiting or headache.  Specifically HA is less  of a problem with Spravato now vs when started it.. Other psych meds: Clonazepam 0.5 mg tablets 2 nightly, gabapentin dosage varies, Dayvigo 10 mg nightly, trazodone 100 mg nightly, cotempla 17.3, switch from Lexapro 20 to sertaline 15o mg . No noticeable effects sertraline from for depression but has seen some antianxiety effects from the sertraline.  No side effects noted.  No side effects with the other medicines.  Still is only getting very brief benefit 3 to 4 hours from Cotempla for ADD and mood.  She wants to try the Daytrana patch as we have discussed before. No SE  08/13/22 appt: Patient was administered Spravato 84 mg intranasally today.  The patient experienced the typical dissociation which gradually resolved over the 2-hour period of observation.  There were no complications.  Specifically the patient did not have nausea or vomiting or headache.  Specifically HA is less of a problem with Spravato now vs when started it.. Other psych meds: Clonazepam 0.5 mg tablets 2 nightly, gabapentin dosage varies, Dayvigo 10 mg nightly, trazodone 100 mg nightly, cotempla 17.3, switch from Lexapro 20 to sertaline 15o mg . No noticeable effects sertraline from for depression but has seen some antianxiety effects from the sertraline.  No side effects noted.  No side effects with the other medicines.  Still is only getting very brief benefit 3 to 4 hours from Cotempla for ADD and mood.  She wants to try the Daytrana patch as we have discussed before.  Hasn't been able to get it yet. No SE Plan.  Continue to try to get Daytrana 30 AM  08/18/22 appt noted: Patient was administered Spravato 84 mg intranasally today.  The patient experienced the typical dissociation which gradually resolved over the 2-hour period of observation.  There were no complications.  Specifically the patient did not have nausea or vomiting or headache.  Specifically HA is less of a problem with Spravato now vs when started it.. Other  psych meds: Clonazepam 0.5 mg tablets 2 nightly, gabapentin dosage varies, Dayvigo 10 mg nightly, trazodone 100 mg nightly, cotempla 17.3, switch from Lexapro 20 to sertaline 15o mg . No noticeable effects sertraline from for depression but has seen some antianxiety effects from the sertraline.  No side effects noted.  No side effects with the other medicines.  Still is only getting very brief benefit 3 to 4 hours from Cotempla for ADD and mood.  She wants to try the Daytrana patch as we have discussed before.  Hasn't been able to get it yet.  Overall depression is some better than it was.   No SE Plan.  Continue to try to get Daytrana 30 AM  08/25/22 appt : No benefit Daytrana.  No SE.  Wants further med changes and interested In retrying pramipexole.   Tolerating meds. Patient was administered Spravato 84 mg intranasally today.  The patient experienced the typical dissociation which gradually resolved over the 2-hour period of observation.  There were no complications.  Specifically the patient did not have nausea or vomiting or headache.  Specifically HA is less of a problem with Spravato now vs when started it.. Other psych meds: Clonazepam 0.5 mg tablets 2 nightly, gabapentin dosage varies, Dayvigo 10 mg nightly, trazodone 100 mg nightly, , switch from Lexapro 20 to sertaline 15o mg . Daytrana 30 AM for a few days. Struggles with depression and no SI.  Reduced interest and mood and motivation and enjoyment and socialization.  08/27/22 appt noted: Psych meds:  Clonazepam 0.5 mg tablets 2 nightly, gabapentin dosage varies, Dayvigo 10 mg nightly, trazodone 100 mg nightly,  sertaline 15o mg . Returned to [employer] AM, pramipexole  Tolerating meds. Patient was administered Spravato 84 mg intranasally today.  The patient experienced the typical dissociation which gradually resolved over the 2-hour period of observation.  There were no complications.  Specifically the patient did not have nausea or vomiting  or headache.   Reports taking cotempla bc brief mood benefit and sig focus and productivity benefit. Took 1 dose pramipexole and felt more dep.  09/01/22 appt noted: Psych meds:  Clonazepam 0.5 mg tablets 2 nightly, gabapentin dosage varies, Dayvigo 10 mg nightly, trazodone 100 mg nightly,  sertaline 15o mg . Returned to Cotempla AM, pramipexole  0.25 mg BID. Tolerating  meds now. Patient was administered Spravato 84 mg intranasally today.  The patient experienced the typical dissociation which gradually resolved over the 2-hour period of observation.  There were no complications.  Specifically the patient did not have nausea or vomiting or headache.   Willing to try the pramipexole and will continue 0.25 mg BID this week and then consider increase if tolerated.  Mood is a little better.  Still looking for further improvement.  09/03/22 appt noted: Psych meds:  Clonazepam 0.5 mg tablets 2 nightly, gabapentin dosage varies, Dayvigo 10 mg nightly, trazodone 100 mg nightly,  sertaline 15o mg . Returned to Cotempla AM, pramipexole  0.25 mg tablet 1 and 1/2 tablets twice daily BID. Tolerating meds now. Patient was administered Spravato 84 mg intranasally today.  The patient experienced the typical dissociation which gradually resolved over the 2-hour period of observation.  There were no complications.  Specifically the patient did not have nausea or vomiting or headache.   Depression may be a little better with the parmipexole and is tolerating it well so far.  Still struggling with focus and residual depression.  Tolerating meds without SE.  More hopeful and able to enjoy some things.  09/08/22 appt  Psych meds:  Clonazepam 0.5 mg tablets 2 nightly, gabapentin dosage varies, Dayvigo 10 mg nightly, trazodone 100 mg nightly,  sertaline 15o mg . Returned to Cotempla AM, pramipexole  0.5 mg BID. Tolerating meds now. Patient was administered Spravato 84 mg intranasally today.  The patient experienced the  typical dissociation which gradually resolved over the 2-hour period of observation.  There were no complications.  Specifically the patient did not have nausea or vomiting or headache.   She has seen her depression drop from a 10/10 prior to treatment with Spravato to now 4/10 With additional benefit noted when she increased the pramipexole to 0.5 mg twice daily.  She is not having side effects with this like she did in the past. Anxiety levels are also improved. She is sleeping okay with the current medications. Plan: continue pramipexole trial off-label and increase more slowly for TRD.  Increase  Gradually  to 0.75 mg BID  09/11/22 appt noted: Psych meds:  Clonazepam 0.5 mg tablets 2 nightly, gabapentin dosage varies, Dayvigo 10 mg nightly, trazodone 100 mg nightly,  sertaline 15o mg . Returned to Cotempla AM, pramipexole  0.75 mg BID. Tolerating meds now. Patient was administered Spravato 84 mg intranasally today.  The patient experienced the typical dissociation which gradually resolved over the 2-hour period of observation.  There were no complications.  Specifically the patient did not have nausea or vomiting or headache.   She has seen her depression drop from a 10/10 prior to treatment with Spravato to now 4/10 With additional benefit noted when she increased the pramipexole to 0.5 mg twice daily.  She is not having side effects with this like she did in the past. Clearly improving with the pramipexole now and tolerating it.  Satisfied with current meds but would consider increasing if it might be helpful.  09/15/22 appt noted: Psych meds:  Clonazepam 0.5 mg tablets 2 nightly, gabapentin dosage varies, Dayvigo 10 mg nightly, trazodone 100 mg nightly,  sertaline 15o mg . Returned to Cotempla AM, pramipexole  0.75 mg BID too anxious and reduced to 0.5 mg BID. Tolerating meds now. Patient was administered Spravato 84 mg intranasally today.  The patient experienced the typical dissociation  which gradually resolved over the 2-hour period of observation.  There were no  complications.  Specifically the patient did not have nausea or vomiting or headache.   Trouble staying asleep longer than 6 hours.  Doesn't feel she can tolerate pramipexole 0.75 mg BID DT anxiety not experienced at  0.5 mg BID.  Otherwise tolerating meds.  Mood is better with Spravato and pramipexole.   Not sleeping as long with meds.  EMA 6 hours.  Wonders about change Disc concerns about waiting for therapy. Feels ready to reduce Spravato to weekly.    09/25/22 appt noted: Psych meds:  Clonazepam 0.5 mg tablets 2 nightly, gabapentin dosage varies, Dayvigo 10 mg nightly, trazodone 100 mg nightly,  sertaline 15o mg . Returned to Cotempla AM, pramipexole  0.75 mg BID too anxious and reduced to 0.5 mg BID. Tolerating meds now. Patient was administered Spravato 84 mg intranasally today.  The patient experienced the typical dissociation which gradually resolved over the 2-hour period of observation.  There were no complications.  Specifically the patient did not have nausea or vomiting or headache.   Trouble staying asleep longer than 6 hours.  Failed to respond to Seroquel 25 mg HS.  Gone back to [facility].  Belsomra NR. Mood ok until the last few days more dep bc it's been 10 days since last admin.  Wants to continue weekly.   No SE.  No med changes desired.  09/29/22 appt noted: Psych meds:  Clonazepam 0.5 mg tablets 2 nightly, gabapentin dosage varies, Dayvigo 10 mg nightly, trazodone 100 mg nightly,  sertaline 15o mg . Returned to Cotempla AM, pramipexole  0.75 mg BID too anxious and reduced to 0.5 mg BID. Tolerating meds now. Patient was administered Spravato 84 mg intranasally today.  The patient experienced the typical dissociation which gradually resolved over the 2-hour period of observation.  There were no complications.  Specifically the patient did not have nausea or vomiting or headache.   Trouble staying asleep  longer than 6 hours.  Plan: Continue sertaline to 200 mg daily.  It has helped anxiety but not depression.   for insomnia, clonazepam 1 mg HS. trazodone doesn't work well but helps some Continue Dayvigo 10 HS.  Failed resp Belsomra 15 and seroquel 25 She wants to continue Cotempla or Concerta bc helps mood early in day and focus  benefit lasts longer.  Have not been able to get stimulant to be consitently effective throughout the day. continue pramipexole trial off-label and reduce back to  TRD 0.5 mg BID Spravato weekly.  10/13/22 appt noted: Not sleeping as well.  Wants to increase Spravato to twice weekly bc feeling more dep close to the next sched date.  Just the day or 2 before.  .  Plan: Trial for off label TRD increase pramipexole 1 mg TID  10/20/22 appt noted: Psych meds:  Clonazepam 0.5 mg tablets 2 nightly, gabapentin dosage varies, Dayvigo 10 mg nightly, trazodone 100 mg nightly,  sertaline 15o mg . Returned to Cotempla AM, pramipexole  1 mg TID. Tolerating meds now. Patient was administered Spravato 84 mg intranasally today.  The patient experienced the typical dissociation which gradually resolved over the 2-hour period of observation.  There were no complications.  Specifically the patient did not have nausea or vomiting or headache.   Trouble staying asleep longer than 6 hours.  More dep this week and asked to get Spravato twice this week. Plan: Trial for off label TRD increase pramipexole 1 mg QID  10/22/22 appt noted:  Psych meds:  Clonazepam 0.5 mg tablets 2 nightly and  another one with EMA, gabapentin dosage varies, Belsomra 20 mg daily,  trazodone 100 mg nightly,  sertaline 15o mg . Returned to Cotempla AM, pramipexole  1 mg QID for 2nd day Tolerating meds now.  Except a little N and dizzy briefly after takes it. Patient was administered Spravato 84 mg intranasally today.  The patient experienced the typical dissociation which gradually resolved over the 2-hour period of  observation.  There were no complications.  Specifically the patient did not have nausea or vomiting or headache. No change in mood yet.   Sleep better with Belsomra 20 as long as takes with other meds.  If insomnia mood is worse. Ongoing some dep but gets partial relief with Spravato but would like better relief .  10/27/22 received Spravato 84  11/03/22 appt noted:  Meds as above except sertraline she cut to 100 mg instead of 200 mg daily She doesn't think sertraline helping and worries over poss SE Off and on Cotempla .  Not affecting BP. Doesn't notice benefit or SE with pramipexole 1 mg QID.  No SE Sleep is OK.  Some awakening aftter 6 hours but not enough so takes another clonazepam.   Patient was administered Spravato 84 mg intranasally today.  The patient experienced the typical dissociation which gradually resolved over the 2-hour period of observation.  There were no complications.  Specifically the patient did not have nausea or vomiting or headache. No change in mood yet.   Sleep better with Belsomra 20 as long as takes with other meds.  If insomnia mood is worse. Ongoing some dep but gets partial relief with Spravato but would like better relief .  No sig benefit with pramipexole 1 mg QID.  11/21/22 TC:  To ER with dep 11/21/22. No SE with selegiline but feels more dep.  Wants to stop it. Today is only day 2 of 1 tablet of selegiline 5 mg AM.  Reviewed with her that she had not been satisfied with the response of Spravato and Zoloft and that she is likely feeling worse because the Zoloft is wearing off and in particular because she abruptly stopped 150 mg daily.  She needs to give the selegiline time to work.  She agrees to do so.  She admitted to some passive suicidal thoughts but no active suicidal thoughts and no desire to die.  She agrees to the plan to increase selegiline to 10 mg every morning this weekend and to continue the selegiline trial.  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  She is aware.  Meredith Staggers, MD, DFAPA  12/19/22 TC: Pt called at 1:42p.  She said she wanted to know if she could start weaning off the Selegiline.  She thinks increasing the dosage is making things worse.  She said she wants to go back to [employer].  She acknowleged she has an appt on Monday. I advised her that I wasn't sure Dr Jennelle Human will see her message before her appt, but she still ask me to send the message.  Next appt 8/26 MD resp.  No change until Monday appt.  12/22/22 appt noted: Meds: selegiline up to 5 mg 4 daily for a week but felt it incr irritability and went back to 3 mg daily. She wants to go back to Spravato.  Not near as dep as now.  Selegiline didn't help energy. Still on Belsomra Thinks maybe sertraline helped mood more than she thought. More dep  than anxiety.   Plan: DC selegiline and will retry Effexor which helped in the past at 300 mg daily.  Start this Friday after over 10 half lives of selegiline.    12/24/22 appt noted: Stopped selegiline  Psych med: belsomra 20, clonazepam 1 mg HS.   She has felt more dep off the Spravato though at the time didn't think it was helping.  Has felt more down and withdrawn.  Low energy, motivation, not smiling.  No SI.  Sleep ok.   Patient was administered Spravato 84 mg intranasally today.  The patient experienced the typical dissociation which gradually resolved over the 2-hour period of observation.  There were no complications.  Specifically the patient did not have nausea or vomiting or headache. No problems with BP or other unusual SE Mood is a little better after this administration of Spravato.  Very motivated to continue it.  No immediate med concerns. No SI .  Ongoing anhedonia.  12/25/22 appt noted: Psych med: belsomra 20, clonazepam 1 mg HS.  Not resumed stimulant or AD yet   No SE. Patient was administered Spravato 84 mg intranasally today.  The patient experienced  the typical dissociation which gradually resolved over the 2-hour period of observation.  There were no complications.  Specifically the patient did not have nausea or vomiting or headache.  There is a lot things going on in her life and it is somewhat difficult to separate stressors from what is going on with her mood.  However she is happy to resume Spravato as she decided after stopping briefly that it was helpful.  She plans to start the antidepressant Morrow.  She also wants to resume the stimulant tomorrow.  12/30/22 appt noted; Psych med: belsomra 20, clonazepam 1 mg HS.  Not resumed stimulant.  Effexor XR 37.5 mg daily   No SE. Patient was administered Spravato 84 mg intranasally today.  The patient experienced the typical dissociation which gradually resolved over the 2-hour period of observation.  There were no complications.  Specifically the patient did not have nausea or vomiting or headache.  She is still having depression with very prominent anhedonia.  However she feels the Spravato is somewhat helpful and is hopeful for more complete response as she gets back onto a reasonable antidepressant dose.  She tolerated Effexor XR 300 mg daily in the past and took it for 5 months and it is probably the antidepressants worked best for her.  She is agreeable with this plan.  She is getting sleep benefit from the current medications Plan: incr Effexor 75 mg daily  01/02/23 appt noted: Psych meds as noted above except increase Effexor XR to 75 mg daily without side effects. Patient was administered Spravato 84 mg intranasally today.  The patient experienced the typical dissociation which gradually resolved over the 2-hour period of observation.  There were no complications.  Specifically the patient did not have nausea or vomiting or headache.  Agrees to increasing Effexor as planned.  SX not changed from that noted above.  No SI  01/05/23 appt noted: Psych meds as noted above except increase Effexor  XR to 112.5 mg daily without side effects. Patient was administered Spravato 84 mg intranasally today.  The patient experienced the typical dissociation which gradually resolved over the 2-hour period of observation.  There were no complications.  Specifically the patient did not have nausea or vomiting or headache.  Agrees to increasing Effexor as planned gradually to 300 mg daily.  SX not changed  from that noted above.  Does however feel Spravato has dampened the degree of depression.  No SI Stressful life event discussed and won't be resolved until early next year.  01/07/23 appt noted: Psych meds Effexor XR 112.5 mg daily. Patient was administered Spravato 84 mg intranasally today.  The patient experienced the typical dissociation which gradually resolved over the 2-hour period of observation.  There were no complications.  Specifically the patient did not have nausea or vomiting or headache.  Agrees to increasing Effexor as planned gradually to 300 mg daily.  SX not changed from that noted above.  Does however feel Spravato has dampened the degree of depression markedly compared to when off psych meds and no Spravato.  No SI  01/12/23 appt noted; Psych meds Effexor XR 225 mg daily. No SE notedl. Patient was administered Spravato 84 mg intranasally today.  The patient experienced the typical dissociation which gradually resolved over the 2-hour period of observation.  There were no complications.  Specifically the patient did not have nausea or vomiting.  But is having headache.  Agrees to increasing Effexor as planned gradually to 300 mg daily.  SX not changed from that noted above.  Does however feel Spravato has dampened the degree of depression markedly compared to when off psych meds and no Spravato.  No SI  01/15/23 appt noted:  Psych meds Effexor XR 225 mg daily. No SE noted. Patient was administered Spravato 84 mg intranasally today.  The patient experienced the typical dissociation which  gradually resolved over the 2-hour period of observation.  There were no complications.  Specifically the patient did not have nausea or vomiting.   Doing ok with higher doses of Effexor and will plan to increase to 300 mg daily. Mild improvement noticed so far with the incrase in meds.   Uses Cotempla prn and it helps but doesn't liek the way she feels when it wears off.  01/22/23 appt noted: Psych meds reduced Effexor XR 150 mg daily. Bc of HA No SE noted. Patient was administered Spravato 84 mg intranasally today.  The patient experienced the typical dissociation which gradually resolved over the 2-hour period of observation.  There were no complications.  Specifically the patient did not have nausea or vomiting.  Ongoing moderate levels of depression with some improvement from the Spravato.  She is not sure about the effect of the venlafaxine on her mood yet.  She would like to continue to try pursuing a stimulant but has had difficulty tolerating p.o. versions of stimulants because of the crash at the end and side effects with many of them.  She has several questions about this.  01/27/23 appt noted: Psych meds: Clonazepam 1 to 1.5 mg nightly, lithium CR 300 nightly, Belsomra 20 nightly, venlafaxine XR 150 every morning No side effects noted at the current dosage. Patient was administered Spravato 84 mg intranasally today.  The patient experienced the typical dissociation which gradually resolved over the 2-hour period of observation.  There were no complications.  Specifically the patient did not have nausea or vomiting.  Ongoing moderate levels of depression with some improvement from the Spravato.   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  She does not tolerate oral stimulants very well because she has a crash as they wear off which is very dysphoric.  She is willing to retry the Daytrana patches that she took in the spring.   She took 1 patch  on 1 occasion recently and felt it was helpful. However she is also having problems with early morning awakening over the last week without precipitant.  She would like to have a different treatment strategy for sleep but understands her sleep problems have historically been difficult to resolve and maintain.  02/10/23 appt noted: Psych meds: Clonazepam 1 to 1.5 mg nightly, lithium CR 300 nightly, Belsomra 20 nightly, venlafaxine XR 150 every morning, Daytrana 30 mg Am No side effects noted at the current dosage. Patient was administered Spravato 84 mg intranasally today.  The patient experienced the typical dissociation which gradually resolved over the 2-hour period of observation.  There were no complications.  Specifically the patient did not have nausea or vomiting.  Ongoing moderate levels of depression with some improvement from the Spravato.  Particularly anhedonia.  No sig benefit noted with Daytrana and problems with table stimulants bc gets brief benefit with unpleasant "crash".  Wants to try another type of patch if available.  02/17/23 appt noted: Psych meds: Clonazepam 1 to 1.5 mg nightly, lithium CR 300 nightly, Belsomra 20 nightly, venlafaxine XR 225 mg every morning, Daytrana 30 mg Am No side effects noted at the current dosage. Patient was administered Spravato 84 mg intranasally today.  The patient experienced the typical dissociation which gradually resolved over the 2-hour period of observation.  There were no complications.  Specifically the patient did not have nausea or vomiting.  Ongoing moderate levels of depression with some improvement from the Spravato.  Particularly anhedonia.  She has not gotten the new stimulant yet but is hopeful. No problems with increasing venlafaxine.   Mood is better with Spravato and venlafaxine.  Less anhedonia, less down. Themazepam not helpful.    03/04/23 received Spravato 84 03/12/23 received Spravato 84  03/19/23 appt  noted: Psych meds: Clonazepam 1 to 1.5 mg nightly, lithium CR 300 nightly, Belsomra 20 nightly, venlafaxine XR 300 mg every morning, tried various stimulants lately No side effects noted at the current dosage. Patient was administered Spravato 84 mg intranasally today.  The patient experienced the typical dissociation which gradually resolved over the 2-hour period of observation.  There were no complications.  Specifically the patient did not have nausea or vomiting.  Not getting good duration from oral stimulants.  She would like to try another patch but has to take tabs first and try them before insurance will cover patches.  Overall dep is better with meds and Spravato.  Clearly sees benefit.  03/30/23 appt noted: Psych meds: Clonazepam 1 to 1.5 mg nightly, lithium CR 300 nightly,  venlafaxine XR 300 mg every morning, tried various stimulants lately, gabapentin 900 mg HS, dexmethylphenidate ER without help No side effects noted at the current dosage. Patient was administered Spravato 84 mg intranasally today.  The patient experienced the typical dissociation which gradually resolved over the 2-hour period of observation.  There were no complications.  Specifically the patient did not have nausea or vomiting.  Able to leave at end of 2 hour period without assistance.  Clear mood benefit with Spravato with much less dep and wants to continue. Still trouble sleeping. TR insomnia.  Ongoing conc problems with minimal brief benefit from stimulants.  Not able to get patch Caprice Red yet DT insurance.  04/14/23 appt noted: Psych meds: Clonazepam 1 to 1.5 mg nightly, lithium CR 300 nightly,  she reduced on her own venlafaxine XR to 150 mg every morning, tried various stimulants lately, gabapentin 900 mg HS, dexmethylphenidate ER without help No  side effects noted at the current dosage. Patient was administered Spravato 84 mg intranasally today.  The patient experienced the typical dissociation which  gradually resolved over the 2-hour period of observation.  There were no complications.  Specifically the patient did not have nausea or vomiting.  Able to leave at end of 2 hour period without assistance.  Clear mood benefit with Spravato with much less dep and wants to continue. she went to [facility] and had her brain scanned at [facility]. they recommend she taper off Effexor which she is only taking 150 mg currently and start Auvelity. She reduce Effexor on her own to 150 mg daily. She wants further improvement in anhedonia.    04/28/23 received Spravato 84  05/11/23 appt noted: Psych meds: Clonazepam 1 to 1.5 mg nightly, lithium CR 300 nightly,  venlafaxine XR to 150 mg every morning, Auvelity BID, tried various stimulants lately, gabapentin 900 mg HS, dexmethylphenidate ER without help No side effects noted at the current dosage. Patient was administered Spravato 84 mg intranasally today.  The patient experienced the typical dissociation which gradually resolved over the 2-hour period of observation.  There were no complications.  Specifically the patient did not have nausea or vomiting.  Able to leave at end of 2 hour period without assistance.  Clear mood benefit with Spravato with much less dep and wants to continue. Overall her mood is still significantly better with the combination of Spravato venlafaxine and Auvelity in particular.  She has not had any problems from the reduction in venlafaxine but is not dramatically improved with regard to residual depression since adding Auvelity but she is not having side effects.  We agreed that a longer trial of the helpful.  She is also wondering about increasing the Spravato frequency from once every other week to once every 10 days in order to prove improved residual depression and specifically anhedonia.  Overall though she is dramatically better with the current combination of meds. Plan: continue venlafaxine ER 150 with Auvelity BID bc  TRD  05/25/23 appt noted: Psych meds: Clonazepam 1 to 1.5 mg nightly, lithium CR 300 nightly,  venlafaxine XR to 150 mg every morning, Auvelity BID, tried various stimulants lately, gabapentin 900 mg HS, dexmethylphenidate ER without help No side effects noted at the current dosage. Patient was administered Spravato 84 mg intranasally today.  The patient experienced the typical dissociation which gradually resolved over the 2-hour period of observation.  There were no complications.  Specifically the patient did not have nausea or vomiting.  Able to leave at end of 2 hour period without assistance.  Clear mood benefit with Spravato with much less dep and wants to continue.  06/04/23 appt noted: Psych meds: Clonazepam 1 to 1.5 mg nightly, lithium CR 300 nightly,  venlafaxine XR to 150 mg every morning, Auvelity 1 in the AM, tried various stimulants lately, gabapentin 900 mg HS, no current stimulant (Vyvanse 40 NR) She reduced Auvelity to 1 daily DT tremor she thinks it made worse and bc no benefit noted with it. She is interested in increasing the Effexor at 300 mg daily bc did get some benefit from it in the past and tolerated that dos. Chronic dep is better with Spravato but not gone.  Some anhedonia.  Focus is still not good. Problems getting patch ADD med. Patient was administered Spravato 84 mg intranasally today.  The patient experienced the typical dissociation which gradually resolved over the 2-hour period of observation.  There were no complications.  Specifically the patient did not have nausea or vomiting.  Able to leave at end of 2 hour period without assistance.  Clear mood benefit with Spravato with much less dep and wants to continue.  06/16/23 appt noted: Psych meds: Clonazepam 1 to 1.5 mg nightly, lithium CR 300 nightly,  venlafaxine XR didn't yet increase so  225 mg every morning, , tried various stimulants lately, gabapentin 900 mg HS, no current stimulant (Vyvanse 40 NR), trazodone 50  HS Chronic dep is better with Spravato but not gone.  Some anhedonia.  Focus is still not good. Problems getting patch ADD med. Patient was administered Spravato 84 mg intranasally today.  The patient experienced the typical dissociation which gradually resolved over the 2-hour period of observation.  There were no complications.  Specifically the patient did not have nausea or vomiting.  Able to leave at end of 2 hour period without assistance.  Clear mood benefit with Spravato with much less dep and wants to continue. She has not increased venlafaxine to 300 mg yet but is eager to do so.  She still has some sleep issues and wonders if trazodone is working.  She has no side effect with current medications.  She would like to try an alternative med for ADD since the stimulants have not been effective. Plan: increase venlafaxine XR to 300 mg every morning  06/29/23 appt noted: Med: Clonazepam 1 to 1.5 mg nightly, lithium CR 300 nightly,  venlafaxine XR 300 mg every morning, , tried various stimulants lately, gabapentin 900 mg HS, no current stimulant (Vyvanse 40 NR), trazodone 50 HS No SE Patient was administered Spravato 84 mg intranasally today.  The patient experienced the typical dissociation which gradually resolved over the 2-hour period of observation.  There were no complications.  Specifically the patient did not have nausea or vomiting.  Able to leave at end of 2 hour period without assistance.  Clear mood benefit with Spravato with much less dep and wants to continue.  07/10/23 appt noted: Med: Clonazepam 1 to 1.5 mg nightly, lithium CR 300 nightly,  venlafaxine XR 300 mg every morning, , tried various stimulants lately, gabapentin 900 mg HS, no current stimulant , trazodone 50 HS No SE Patient was administered Spravato 84 mg intranasally today.  The patient experienced the typical dissociation which gradually resolved over the 2-hour period of observation.  There were no complications.   Specifically the patient did not have nausea or vomiting.  Able to leave at end of 2 hour period without assistance.  Partial response with Spravato is sig but not complete.  Disc questions about stimulant vs Strattera to help more with dep and ADD and productivity. Plan: bc can't get stimulant right now will try Strattera  07/22/23 appt noted: Med: Clonazepam 1 to 1.5 mg nightly, lithium CR 300 nightly,  venlafaxine XR 300 mg every morning,  gabapentin 900 mg HS, no current stimulant & never took Strattera, trazodone 50 HS No SE Patient was administered Spravato 84 mg intranasally today.  The patient experienced the typical dissociation which gradually resolved over the 2-hour period of observation.  There were no complications.  Specifically the patient did not have nausea or vomiting.  Able to leave at end of 2 hour period without assistance.  Mood still 50% or so better with spravato and current meds. Still wants to try stimulant and Noemi [place] just approved finally. Wants to stop some meds bc doesn't like taking a lot of meds.  Most immediate concern is intermittent  tremor.  AIMS    Flowsheet Row Video Visit from 01/03/2022 in [facility]  AIMS Total Score 0      ECT-MADRS    Flowsheet Row Clinical Support from 08/13/2022 in [facility] Video Visit from 04/29/2022 in [facility] Video Visit from 02/06/2022 in [facility]  MADRS Total Score [phone]      GAD-7    Flowsheet Row Counselor from 12/19/2022 in [facility] Office Visit from 08/22/2022 in [facility] [place]  Total GAD-7 Score 4 7      PHQ2-9    Flowsheet Row Counselor from 12/19/2022 in [facility] Office Visit from 08/22/2022 in [facility] [place] Office Visit from 08/19/2022 in [facility]  Healthcare at [facility] Video Visit from 02/06/2022 in [facility] Office Visit from 11/16/2020 in [facility] at [facility] Total Score 2 3 0 6 0  PHQ-9 Total Score 5 5 -- 14 --        Past Psychiatric Medication Trials: Trintellix - Initially effective and then was not effective when re-started Paxil- Ineffective. Helped initially. Prozac-Insomnia Lexapro- Effective and then no longer as effective Zoloft- Initially effective and then minimal response Viibryd Cymbalta- Ineffective. Helped initially. Effexor XR 300- Effective, well tolerated. Pristiq- Increased anxiety at 150 mg daily Wellbutrin XL-Ineffective.May have increased anxiety. Amitriptyline Nortriptyline-Caused irritability, constipation Auvelity- ineffective 2 trials Selegilne brief 20 mg daily , more irritable  Abilify- Helpful but caused severe insomnia. Rexulti-Helpful but caused insomnia. Vraylar Seroquel - Ineffective Risperdal Olanzapine- Ineffective Latuda- Ineffective Caplyta  Klonopin- Effective Temazepam- Took during menopause Lunesta- Ineffective Ambien-Ineffective Sonata- Ineffective Dayvigo- partially effective  Belsomra 20 worked and then failed  Trazodone-  somewhat effective Remeron-Ineffective. Does not recall wt gain.  Quetiapine 25 NR  Adderall- Took once and had adverse effect. Concerta- Effective but only 4-6 hours Jornay 80 NR Metadate-Not as effective as Concerta Dexmethylphenidate IR NR Dexmethylphenidate ER 10/02/21- Not as effective as Concerta Azstarys- some side effects. Not as effective.  Ritalin short acting and SE anxiety & HTN @20  TID Cotempla 17.3  Daytrana NR Vyvanse 40 NR  Lamictal- May have had an adverse effects. "Felt weird." Reports worsening s/s.  Lithium ? effect Gabapentin Pramipexole- 1 mg QID NR  ECT #20 with minimal response. H says it was partly helpful.  AIMS     Flowsheet Row Video Visit from 01/03/2022 in [facility]  AIMS Total Score 0      ECT-MADRS    Flowsheet Row Clinical Support from 08/13/2022 in [facility] Video Visit from 04/29/2022 in [facility] Video Visit from 02/06/2022 in [facility]  MADRS Total Score 27 44 23      GAD-7    Flowsheet Row Counselor from 12/19/2022 in [id number] Office Visit from 08/22/2022 in [facility] [place]  Total GAD-7 Score 4 7      PHQ2-9    Flowsheet Row Counselor from 12/19/2022 in [facility] Office Visit from 08/22/2022 in [facility] [place] Office Visit from 08/19/2022 in [facility] Video Visit from 02/06/2022 in [facility] Office Visit from 11/16/2020 in [facility]  for [employer] [facility] [employer] Total Score 2 3 0 6 0  PHQ-9 Total Score 5 5 -- 14 --        Review of Systems:  Review of Systems  Constitutional:  Positive for fatigue.  HENT:  Negative for dental problem.   Cardiovascular:  Negative for palpitations.  Neurological:  Positive for tremors. Negative for dizziness and headaches.  Psychiatric/Behavioral:  Positive for decreased concentration, dysphoric mood and sleep disturbance. Negative for agitation, behavioral problems, confusion, hallucinations and suicidal ideas. The patient is nervous/anxious.     Medications: I have reviewed the patient's current medications.  Current Outpatient Medications  Medication Sig Dispense Refill   clonazePAM (KLONOPIN) 1 MG tablet Take 1-2 tablets (1-2 mg total) by mouth at bedtime. 60 tablet 0   Esketamine HCl, 84 MG Dose, (SPRAVATO, 84 MG DOSE,) 28 MG/DEVICE SOPK USE 3 SPRAYS IN EACH NOSTRIL ONCE A WEEK 3 each 1    Estradiol (VAGIFEM) 10 MCG TABS vaginal tablet Place 1 tablet (10 mcg total) vaginally 2 (two) times a week. 24 tablet 3   gabapentin (NEURONTIN) 300 MG capsule 2 at bedtime and 1-2 capsule if need for early morning awakening. 120 capsule 1   lithium carbonate (LITHOBID) 300 MG ER tablet TAKE 2 TABLETS BY MOUTH AT BEDTIME (Patient taking differently: Take 300 mg by mouth at bedtime.) 180 tablet 0   traZODone (DESYREL) 50 MG tablet TAKE 1 TABLET BY MOUTH AT BEDTIME 30 tablet 0   valACYclovir (VALTREX) 500 MG tablet Take one twice daily for 3 days with any symptoms 30 tablet 3   valsartan (DIOVAN) 80 MG tablet Take 1 tablet (80 mg total) by mouth daily. 90 tablet 3   venlafaxine XR (EFFEXOR-XR) 75 MG 24 hr capsule Take 3 capsules (225 mg total) by mouth daily with breakfast. (Patient taking differently: Take 300 mg by mouth daily with breakfast.) 90 capsule 1   atomoxetine (STRATTERA) 25 MG capsule 1 daily for 4 days then 2 daily for 4 days, then 3 daily (Patient not taking: Reported on 07/22/2023) 90 capsule 0   Dextroamphetamine (XELSTRYM) 13.5 MG/9HR PTCH Place 1 patch onto the skin every morning. (Patient not taking: Reported on 07/22/2023) 30 patch 0   No current facility-administered medications for this visit.    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   TRANSAMINASES, SERUM, ELEVATED 08/14/2009    Past Medical History, Surgical history, Social history, and Family history were reviewed and updated as appropriate.   Please see review of systems for further details on the patient's review from today.   Objective:   Physical Exam:  LMP  12/27/2007 (Exact Date)   Physical Exam Constitutional:      General: She is not in acute distress. Musculoskeletal:        General: No deformity.  Neurological:     Mental Status: She is alert and oriented to person, place, and time.     Motor: Tremor present.     Coordination: Coordination normal.     Comments: Minimal tremor  Psychiatric:  Attention and Perception: Perception normal. She is inattentive. She does not perceive auditory hallucinations.        Mood and Affect: Mood is anxious and depressed. Affect is blunt. Affect is not tearful.        Speech: Speech normal. Speech is not rapid and pressured.        Behavior: Behavior normal.        Thought Content: Thought content normal. Thought content is not delusional. Thought content does not include homicidal or suicidal ideation. Thought content does not include suicidal plan.        Cognition and Memory: Cognition and memory normal.     Comments: Insight  and judgment fair.  Tendency to impulsive decisions 4/10 dep but better with Spravato markedly. Affect blunted chronically     Lab Review:     Component Value Date/Time   NA 132 (L) 10/27/2022 1107   K 4.4 10/27/2022 1107   CL 95 (L) 10/27/2022 1107   CO2 30 10/27/2022 1107   GLUCOSE 89 10/27/2022 1107   BUN 14 10/27/2022 1107   CREATININE 0.67 10/27/2022 1107   CREATININE 0.68 01/26/2020 0920   CALCIUM 10.1 10/27/2022 1107   PROT 8.3 04/22/2022 0932   ALBUMIN 5.3 (H) 04/22/2022 0932   AST 82 (H) 04/22/2022 0932   ALT 118 (H) 04/22/2022 0932   ALKPHOS 74 04/22/2022 0932   BILITOT 0.3 04/22/2022 0932   GFRNONAA >90 05/01/2014 1645   GFRAA >90 05/01/2014 1645       Component Value Date/Time   WBC 3.3 (L) 04/22/2022 0932   RBC 4.25 04/22/2022 0932   HGB 14.3 04/22/2022 0932   HGB 14.1 10/26/2014 1034   HCT 40.7 04/22/2022 0932   PLT 245.0 04/22/2022 0932   MCV 95.7 04/22/2022 0932   MCH 34.4 (H) 01/26/2020 0920   MCHC 35.1 04/22/2022 0932   RDW  13.2 04/22/2022 0932   LYMPHSABS 0.9 04/22/2022 0932   MONOABS 0.2 04/22/2022 0932   EOSABS 0.1 04/22/2022 0932   BASOSABS 0.0 04/22/2022 0932    No results found for: "POCLITH", "LITHIUM"   No results found for: "PHENYTOIN", "PHENOBARB", "VALPROATE", "CBMZ"   .res Assessment: Plan:    Heather Davila was seen today for follow-up, depression, anxiety, medication problem, add and sleeping problem.  Diagnoses and all orders for this visit:  Attention deficit hyperactivity disorder (ADHD), predominantly inattentive type  Recurrent major depression resistant to treatment (HCC)  Generalized anxiety disorder  Insomnia, unspecified type  Tremor due to multiple drugs     Pt seen for TRD with multiple failed medications as noted above.    Mood 50% better with venlafaxine with Spravato but ongoing dep with anhedonia and fatigue.    There is an ongoing pattern of her making impulsive decisions on her own about meds which are counter therapeutic and not informing MD of what is going on until after the fact.  That is not a helpful pattern and she is encouraged to stop it.   There is some of this today want ing to stop meds bc "I don't like taking so many" but without clarity on the risk of stopping meds after recent changes.  Patient was administered Spravato 84 mg intranasally today.  The patient experienced the typical dissociation which gradually resolved over the 2-hour period of observation.  There were no complications.  Specifically the patient did not have nausea or vomiting or headache.  Blood pressures remained within normal ranges at the 40-minute and 2-hour follow-up intervals.  By the time the 2-hour observation period was met the patient was alert and oriented and able to exit without assistance.  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  See nursing note for further details. Would like to  increase frequency from every other week to every 10 days looking for further improvement in depression.  Consider switch Spravato to MAOI, Parnate  We discussed the short-term risks associated with benzodiazepines including sedation and increased fall risk among others.  Discussed long-term side effect risk including dependence, potential withdrawal symptoms, and the potential eventual dose-related risk of dementia.  But recent studies from 2020 dispute this association between benzodiazepines and dementia risk. Newer studies in 2020 do not support an association with dementia.  for insomnia, wants to continue clonazepam 2 mg nightly. Can't sleep without it. Wants to continue trazodone also  Reports needing gabapentin [phone] mg HS to help sleep.   Conisder switching to patch bc pt has significant crash with stimulants or perhaps [place].  Stimulant tablets cause crash with inadequate duration .  Patches should provide smoother response. The following is finally approved and she will try it. Will try another patch, Noemi Chapel if we can get it approved.    Discussed potential benefits, risks, and side effects of stimulants with patient to include increased heart rate, palpitations, insomnia, increased anxiety, increased irritability, or decreased appetite.  Instructed patient to contact office if experiencing any significant tolerability issues.  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  Disc risk relapse.  I rec she continue bc of this but she wants to stop anyway.    Plan: continue Clonazepam 1 to 1.5 mg nightly, lithium CR 300 nightly, increase venlafaxine XR to 300 mg every morning,  tried various stimulants lately, gabapentin 900 mg HS, trazodone 50 HS.  Hold Strattera  FU every 14days  Meredith Staggers, MD, DFAPA    Please see After Visit Summary for patient specific instructions.  Future Appointments  Date Time Provider  Department Center  08/21/2023  8:00 AM Caryl Never, Elberta Fortis, MD [facility]  09/18/2023  8:15 AM Jerene Bears, MD [facility]    No orders of the defined types were placed in this encounter.   -------------------------------

## 2023-08-04 ENCOUNTER — Ambulatory Visit: Admitting: Psychiatry

## 2023-08-04 ENCOUNTER — Encounter: Payer: Self-pay | Admitting: Psychiatry

## 2023-08-04 ENCOUNTER — Ambulatory Visit

## 2023-08-04 VITALS — BP 100/68 | HR 71

## 2023-08-04 DIAGNOSIS — F900 Attention-deficit hyperactivity disorder, predominantly inattentive type: Secondary | ICD-10-CM | POA: Diagnosis not present

## 2023-08-04 DIAGNOSIS — F339 Major depressive disorder, recurrent, unspecified: Secondary | ICD-10-CM

## 2023-08-04 DIAGNOSIS — F411 Generalized anxiety disorder: Secondary | ICD-10-CM | POA: Diagnosis not present

## 2023-08-04 DIAGNOSIS — G4700 Insomnia, unspecified: Secondary | ICD-10-CM | POA: Diagnosis not present

## 2023-08-04 DIAGNOSIS — G251 Drug-induced tremor: Secondary | ICD-10-CM

## 2023-08-04 MED ORDER — VENLAFAXINE HCL ER 150 MG PO CP24: 300.0000 mg | ORAL_CAPSULE | Freq: Every day | ORAL | 2 refills | Status: DC

## 2023-08-04 NOTE — Progress Notes (Signed)
 NURSES NOTE:           Patient arrived for her #95 Spravato treatment. Pt is coming every other week now. Pt is being treated for Treatment Resistant Depression, pt will be receiving 84 mg (3 of the 28 mg) nasal sprays, this is her maintenance dose. Patient taken to treatment room, I explained and discussed her treatment and how the schedule should go, along with any side effects that may occur. Answered any questions and concerns the patient had. Pt's Spravato is delivered through [place] and she is now billing with her insurance. Spravato medication is stored at doctors office per REMS/FDA guidelines. The medication is required to be locked behind two doors per FDA/REMS Protocol. Medication is also disposed of properly per regulations. All treatments and her vital signs are documented in Spravato REMS per protocol of treatment center regulations. [facility] delivers her medication.      Vital Signs assessed at 1:00 PM 127/70, pulse 57, SpO2 98%. Instructed pt to blow her nose and recline back slightly to prevent any of medication dripping out of her nose.   Pt. Given 1st dose (28 mg inhaler), then 5 minutes between 2nd dose and 3rd dose for total of 84 mg. No complaints of nausea/vomiting reported. Pt did listen to music today, she just reclined back and closed her eyes. After 40 minutes I went to assess her vital signs, no side effects or symptoms reported, B/P 116/67, pulse 65, SpO2 97%.  Dr. Jennelle Human talked to pt today about her treatment. Nurse was with pt a total of 60 minutes but pt observed for 120 minutes per protocol. Discharge vital signs were at 3:05 PM, B/P 100/68, pulse 71, SpO2 97%. She verbalized understanding. She reports dissociation but she was clear upon her discharge. She is instructed to contact office if needing anything prior to her next treatment.  She is scheduled on May 1st, unless she is able to come sooner.    LOT # [id number]   EXP Y [id number]

## 2023-08-04 NOTE — Progress Notes (Signed)
 Heather Davila [id number] April 11, 1961 63 y.o.  Subjective:   Patient ID:  Heather Davila is a 63 y.o. (DOB [birth year]) female.  Chief Complaint:  Chief Complaint  Patient presents with  . Follow-up  . Depression  . Anxiety  . ADD  . Stress  . Sleeping Problem    HPI Heather Davila presents to the office today for follow-up of treatment resistant recurrent depression, generalized anxiety disorder and insomnia.  Almost too numerous to count med failures. She is here for Spravato administration for her treatment resistant depression.  05/21/22 appt noted: Patient was administered first dose Spravato 56 mg intranasally today.  The patient experienced the typical dissociation which gradually resolved over the 2-hour period of observation.  There were no complications.  Specifically the patient did not have nausea or vomiting or headache.   Dissociation was mild. Did not experience any emotional relief from disabling depression.wants to increase Spravato to the usual dose.  She is aware insurance is not covering the costs at this time.  No SI acutely. Tolerating meds.    05/23/22 appt noted: Patient was administered Spravato 84 mg intranasally today.  The patient experienced the typical dissociation which gradually resolved over the 2-hour period of observation.  There were no complications.  Specifically the patient did not have nausea or vomiting.   Dissociation was greater than last dose and a little bothersome DT some HA. Tolerating meds.   Mood has not changed significantly thus far with Spravato.  05/27/22 appt noted: Patient was administered Spravato 84 mg intranasally today.  The patient experienced the typical dissociation which gradually resolved over the 2-hour period of observation.  There were no complications.  Specifically the patient did not have nausea or vomiting or headache. Today she has felt a little relief from the pressing heaviness of depression but a little more  anxiety.  During administration of Spravato she listens to [place] music and was able to relax a little more with the feeling of dissociation that was mildly bothersome. Husband was also in on the session today and asked some questions about IV ketamine versus nasal spray ketamine.  05/29/22 appt noted: Cancelled today DT hypertension  05/30/22 appt noted: Patient was administered Spravato 84 mg intranasally today.  The patient experienced the typical dissociation which gradually resolved over the 2-hour period of observation.  There were no complications.  Specifically the patient did not have nausea or vomiting or headache. She is seeing some improvement in mood with less continuous depression and less intensity.  Hopefulness is better.   No med complaints or SE  06/05/22 appt noted: Patient was administered Spravato 84 mg intranasally today.  The patient experienced the typical dissociation which gradually resolved over the 2-hour period of observation.  There were no complications.  Specifically the patient did not have nausea or vomiting or headache.  Specifically HA is less of a problem with Spravato. No SE with meds. Depression is improving with less intensity.  Intermittent anxiety easily.  More able to enjoy things.  No new concerns.  06/17/22 appt noted: Patient was administered Spravato 84 mg intranasally today.  The patient experienced the typical dissociation which gradually resolved over the 2-hour period of observation.  There were no complications.  Specifically the patient did not have nausea or vomiting or headache.  Specifically HA is less of a problem with Spravato. No SE with meds. Depression is improved 45% with less intensity.  Intermittent anxiety less easily.  More able to enjoy things.  No new  concerns.  More active. Current meds: increased clonazepam to about 1.5 mg HS DT recent insomnia, lexapro 20, Dayvigo 10 mg HS, concerta 36 mg AM, trazodone 100 mg HS.  06/20/22 appt  noted: Current meds: increased clonazepam to about 1.5 mg HS DT recent insomnia, lexapro 20, Dayvigo 10 mg HS, concerta 36 mg AM, trazodone 100 mg HS. Patient was administered Spravato 84 mg intranasally today.  The patient experienced the typical dissociation which gradually resolved over the 2-hour period of observation.  There were no complications.  Specifically the patient did not have nausea or vomiting or headache.  Specifically HA is less of a problem with Spravato. No SE with meds. Depression is improved 45% with less intensity.  Intermittent anxiety less easily.  More able to enjoy things.  No new concerns.  More active.  06/23/22 appt noted:  Current meds: increased clonazepam to about 1.5 mg HS DT recent insomnia, lexapro 20, Dayvigo 10 mg HS, concerta 36 mg AM, trazodone 100 mg HS. Patient was administered Spravato 84 mg intranasally today.  The patient experienced the typical dissociation which gradually resolved over the 2-hour period of observation.  There were no complications.  Specifically the patient did not have nausea or vomiting or headache.  Specifically HA is less of a problem with Spravato. No SE with meds. Intensity of Spravato dissociation varies.  Last week very intesne and this week more typical.  Depression is continuing to improve with better interest and activity and motivation.  Gradual improvement.  Wants to continue.  06/25/22 appt noted: Current meds: increased clonazepam to about 1.5 mg HS DT recent insomnia, lexapro 20, Dayvigo 10 mg HS, concerta 36 mg AM, trazodone 100 mg HS. Patient was administered Spravato 84 mg intranasally today.  The patient experienced the typical dissociation which gradually resolved over the 2-hour period of observation.  There were no complications.  Specifically the patient did not have nausea or vomiting or headache.  Specifically HA is less of a problem with Spravato. No SE with meds. Intensity of Spravato dissociation varies.  No  scary and well tolerated. Continues to gradually improve with Spravato.  She is more motivated and enjoying things more.  H sees progress.  Wants to continue twice weekly until gets maximum response.  Dep is not gone yet.  06/30/22 appt noted:  seen with H Current meds: increased clonazepam to about 1.5 mg HS DT recent insomnia, lexapro 20, Dayvigo 10 mg HS, concerta 36 mg AM, trazodone 100 mg HS. Patient was administered Spravato 84 mg intranasally today.  The patient experienced the typical dissociation which gradually resolved over the 2-hour period of observation.  There were no complications.  Specifically the patient did not have nausea or vomiting or headache.  Specifically HA is less of a problem with Spravato. No SE with meds. Intensity of Spravato dissociation varies.  No scary and well tolerated. Continues to gradually improve with Spravato.  She is more motivated and enjoying things more.  H sees even more progress than she does; more active and smiling.  Wants to continue twice weekly until gets maximum response.  Dep is 80% better.  07/07/22 appt noted: Current meds: increased clonazepam to about 1.5 mg HS DT recent insomnia, lexapro 20, Dayvigo 10 mg HS, concerta 36 mg AM, trazodone 100 mg HS. Patient was administered Spravato 84 mg intranasally today.  The patient experienced the typical dissociation which gradually resolved over the 2-hour period of observation.  There were no complications.  Specifically the patient  did not have nausea or vomiting or headache.  Specifically HA is less of a problem with Spravato. No SE with meds. Intensity of Spravato dissociation varies.  No scary and well tolerated.  Was more intese today. Overall mood is markedly better and please with meds. No changes desired.  07/09/22 appt noted: In transition from Lexapro to sertraline and missing Concerta bc CO crash from it after about 4 hours.  Disc these concerns.  She'll discuss also with Corie Chiquito,  NP More dep the last couple of days and concerned about reducing Spravato frequency while transition from one antidep to another.  Affect less positive. Patient was administered Spravato 84 mg intranasally today.  The patient experienced the typical dissociation which gradually resolved over the 2-hour period of observation.  There were no complications.  Specifically the patient did not have nausea or vomiting or headache.  Specifically HA is less of a problem with Spravato. No SE with meds. Intensity of Spravato dissociation varies.  No scary and well tolerated.   07/14/22 appt noted: Has been more depressed this week.  More trouble with sleep.  Taking clonazepam 1-1.5  of the 0.5 mg tablets.   Trazodone not helping much. She switched back from 200 sertraline to Lexapro 20.  No SE More trouble with motivation.  Some stress with son with special needs. Misses the benevit of Concerta but it was too short acting and crashed in 4-6 hours. Patient was administered Spravato 84 mg intranasally today.  The patient experienced the typical dissociation which gradually resolved over the 2-hour period of observation.  There were no complications.  Specifically the patient did not have nausea or vomiting or headache.  Specifically HA is less of a problem with Spravato. No SE with meds. Intensity of Spravato dissociation varies.  No scary and well tolerated.   07/17/22 appt noted: Patient was administered Spravato 84 mg intranasally today.  The patient experienced the typical dissociation which gradually resolved over the 2-hour period of observation.  There were no complications.  Specifically the patient did not have nausea or vomiting or headache.  Specifically HA is less of a problem with Spravato. No SE with meds. Intensity of Spravato dissociation varies.  Not scary and well tolerated.  Just got Jornay last night but wasn't sure how to take it.  Wants to start and this was discussed.  She felt MPH helped  mood and attn but didn't last long enough and had crash after a few hours on Concerta.  Better for 2 hours after each dose Ritalin 20 mg with mood but then it wore off.  BP up after 3rd dose 160/90 and then took H's amlodipine 5 and BP became normal.  07/22/22 appt noted: Patient was administered Spravato 84 mg intranasally today.  The patient experienced the typical dissociation which gradually resolved over the 2-hour period of observation.  There were no complications.  Specifically the patient did not have nausea or vomiting or headache.  Specifically HA is less of a problem with Spravato. No SE with meds. Intensity of Spravato dissociation varies.  Not scary and well tolerated.  Start Jornay 20 mg over the weekend and did not notice any particular effect good or bad.  Increased to 40 mg. Other psych meds: Clonazepam 0.5 mg tablets 2 nightly, gabapentin dosage varies, Dayvigo 10 mg nightly, Lexapro 20 mg daily, to trazodone 100 mg nightly  07/24/22 appt noted: Patient was administered Spravato 84 mg intranasally today.  The patient experienced the typical dissociation  which gradually resolved over the 2-hour period of observation.  There were no complications.  Specifically the patient did not have nausea or vomiting or headache.  Specifically HA is less of a problem with Spravato. No SE with meds. Intensity of Spravato dissociation varies.  Not scary and well tolerated.  Other psych meds: Clonazepam 0.5 mg tablets 2 nightly, gabapentin dosage varies, Dayvigo 10 mg nightly, Lexapro 20 mg daily, to trazodone 100 mg nightly, Jornay 40 mg PM Doing well with Spravato.  Noticed a little effect from the Jornay 40 without SE but would like to increase the dose for ADD and mood.  Sleeping well.  No new concerns.  Still more depressed than she was with th initial benefit of Spravato.  07/30/22 note:  Patient was administered Spravato 84 mg intranasally today.  The patient experienced the typical  dissociation which gradually resolved over the 2-hour period of observation.  There were no complications.  Specifically the patient did not have nausea or vomiting or headache.  Specifically HA is less of a problem with Spravato. Other psych meds: Clonazepam 0.5 mg tablets 2 nightly, gabapentin dosage varies, Dayvigo 10 mg nightly, Lexapro 20 mg daily, to trazodone 100 mg nightly, Jornay 60 mg PM Wants to increase Jornay to 80 mg PM to increase benefit for ADD and depression. No SE She has experienced a major family crisis that threatens to change her daily life from now on.  It has not triggered suicidal thoughts at this time but she is very afraid.  No desire to change medications.  08/04/22 appt noted" Patient was administered Spravato 84 mg intranasally today.  The patient experienced the typical dissociation which gradually resolved over the 2-hour period of observation.  There were no complications.  Specifically the patient did not have nausea or vomiting or headache.  Specifically HA is less of a problem with Spravato. Other psych meds: Clonazepam 0.5 mg tablets 2 nightly, gabapentin dosage varies, Dayvigo 10 mg nightly, Lexapro 20 mg daily, trazodone 100 mg nightly, Jornay 80 mg PM No SE No benefit or SE with increase Jormay 80 mg.  Says Concerta helped ADD and depression but not Jornay.  However duration was insufficient.  Still depressed and wants change to another form of stimulant.  No concerns with other meds. Continues with strong family stressors creating uncertainty about her future but no quick resolution. No SI Trial Cotempla 17.3 and DC Ritalin & Jornay 80  08/11/2022 appointment noted: Patient was administered Spravato 84 mg intranasally today.  The patient experienced the typical dissociation which gradually resolved over the 2-hour period of observation.  There were no complications.  Specifically the patient did not have nausea or vomiting or headache.  Specifically HA is less  of a problem with Spravato now vs when started it.. Other psych meds: Clonazepam 0.5 mg tablets 2 nightly, gabapentin dosage varies, Dayvigo 10 mg nightly, trazodone 100 mg nightly, cotempla 17.3, switch from Lexapro 20 to sertaline 15o mg . No noticeable effects sertraline from for depression but has seen some antianxiety effects from the sertraline.  No side effects noted.  No side effects with the other medicines.  Still is only getting very brief benefit 3 to 4 hours from Cotempla for ADD and mood.  She wants to try the Daytrana patch as we have discussed before. No SE  08/13/22 appt: Patient was administered Spravato 84 mg intranasally today.  The patient experienced the typical dissociation which gradually resolved over the 2-hour period of observation.  There were no complications.  Specifically the patient did not have nausea or vomiting or headache.  Specifically HA is less of a problem with Spravato now vs when started it.. Other psych meds: Clonazepam 0.5 mg tablets 2 nightly, gabapentin dosage varies, Dayvigo 10 mg nightly, trazodone 100 mg nightly, cotempla 17.3, switch from Lexapro 20 to sertaline 15o mg . No noticeable effects sertraline from for depression but has seen some antianxiety effects from the sertraline.  No side effects noted.  No side effects with the other medicines.  Still is only getting very brief benefit 3 to 4 hours from Cotempla for ADD and mood.  She wants to try the Daytrana patch as we have discussed before.  Hasn't been able to get it yet. No SE Plan.  Continue to try to get Daytrana 30 AM  08/18/22 appt noted: Patient was administered Spravato 84 mg intranasally today.  The patient experienced the typical dissociation which gradually resolved over the 2-hour period of observation.  There were no complications.  Specifically the patient did not have nausea or vomiting or headache.  Specifically HA is less of a problem with Spravato now vs when started it.. Other  psych meds: Clonazepam 0.5 mg tablets 2 nightly, gabapentin dosage varies, Dayvigo 10 mg nightly, trazodone 100 mg nightly, cotempla 17.3, switch from Lexapro 20 to sertaline 15o mg . No noticeable effects sertraline from for depression but has seen some antianxiety effects from the sertraline.  No side effects noted.  No side effects with the other medicines.  Still is only getting very brief benefit 3 to 4 hours from Cotempla for ADD and mood.  She wants to try the Daytrana patch as we have discussed before.  Hasn't been able to get it yet.  Overall depression is some better than it was.   No SE Plan.  Continue to try to get Daytrana 30 AM  08/25/22 appt : No benefit Daytrana.  No SE.  Wants further med changes and interested In retrying pramipexole.   Tolerating meds. Patient was administered Spravato 84 mg intranasally today.  The patient experienced the typical dissociation which gradually resolved over the 2-hour period of observation.  There were no complications.  Specifically the patient did not have nausea or vomiting or headache.  Specifically HA is less of a problem with Spravato now vs when started it.. Other psych meds: Clonazepam 0.5 mg tablets 2 nightly, gabapentin dosage varies, Dayvigo 10 mg nightly, trazodone 100 mg nightly, , switch from Lexapro 20 to sertaline 15o mg . Daytrana 30 AM for a few days. Struggles with depression and no SI.  Reduced interest and mood and motivation and enjoyment and socialization.  08/27/22 appt noted: Psych meds:  Clonazepam 0.5 mg tablets 2 nightly, gabapentin dosage varies, Dayvigo 10 mg nightly, trazodone 100 mg nightly,  sertaline 15o mg . Returned to [employer] AM, pramipexole  Tolerating meds. Patient was administered Spravato 84 mg intranasally today.  The patient experienced the typical dissociation which gradually resolved over the 2-hour period of observation.  There were no complications.  Specifically the patient did not have nausea or vomiting  or headache.   Reports taking cotempla bc brief mood benefit and sig focus and productivity benefit. Took 1 dose pramipexole and felt more dep.  09/01/22 appt noted: Psych meds:  Clonazepam 0.5 mg tablets 2 nightly, gabapentin dosage varies, Dayvigo 10 mg nightly, trazodone 100 mg nightly,  sertaline 15o mg . Returned to Cotempla AM, pramipexole  0.25 mg BID. Tolerating  meds now. Patient was administered Spravato 84 mg intranasally today.  The patient experienced the typical dissociation which gradually resolved over the 2-hour period of observation.  There were no complications.  Specifically the patient did not have nausea or vomiting or headache.   Willing to try the pramipexole and will continue 0.25 mg BID this week and then consider increase if tolerated.  Mood is a little better.  Still looking for further improvement.  09/03/22 appt noted: Psych meds:  Clonazepam 0.5 mg tablets 2 nightly, gabapentin dosage varies, Dayvigo 10 mg nightly, trazodone 100 mg nightly,  sertaline 15o mg . Returned to Cotempla AM, pramipexole  0.25 mg tablet 1 and 1/2 tablets twice daily BID. Tolerating meds now. Patient was administered Spravato 84 mg intranasally today.  The patient experienced the typical dissociation which gradually resolved over the 2-hour period of observation.  There were no complications.  Specifically the patient did not have nausea or vomiting or headache.   Depression may be a little better with the parmipexole and is tolerating it well so far.  Still struggling with focus and residual depression.  Tolerating meds without SE.  More hopeful and able to enjoy some things.  09/08/22 appt  Psych meds:  Clonazepam 0.5 mg tablets 2 nightly, gabapentin dosage varies, Dayvigo 10 mg nightly, trazodone 100 mg nightly,  sertaline 15o mg . Returned to Cotempla AM, pramipexole  0.5 mg BID. Tolerating meds now. Patient was administered Spravato 84 mg intranasally today.  The patient experienced the  typical dissociation which gradually resolved over the 2-hour period of observation.  There were no complications.  Specifically the patient did not have nausea or vomiting or headache.   She has seen her depression drop from a 10/10 prior to treatment with Spravato to now 4/10 With additional benefit noted when she increased the pramipexole to 0.5 mg twice daily.  She is not having side effects with this like she did in the past. Anxiety levels are also improved. She is sleeping okay with the current medications. Plan: continue pramipexole trial off-label and increase more slowly for TRD.  Increase  Gradually  to 0.75 mg BID  09/11/22 appt noted: Psych meds:  Clonazepam 0.5 mg tablets 2 nightly, gabapentin dosage varies, Dayvigo 10 mg nightly, trazodone 100 mg nightly,  sertaline 15o mg . Returned to Cotempla AM, pramipexole  0.75 mg BID. Tolerating meds now. Patient was administered Spravato 84 mg intranasally today.  The patient experienced the typical dissociation which gradually resolved over the 2-hour period of observation.  There were no complications.  Specifically the patient did not have nausea or vomiting or headache.   She has seen her depression drop from a 10/10 prior to treatment with Spravato to now 4/10 With additional benefit noted when she increased the pramipexole to 0.5 mg twice daily.  She is not having side effects with this like she did in the past. Clearly improving with the pramipexole now and tolerating it.  Satisfied with current meds but would consider increasing if it might be helpful.  09/15/22 appt noted: Psych meds:  Clonazepam 0.5 mg tablets 2 nightly, gabapentin dosage varies, Dayvigo 10 mg nightly, trazodone 100 mg nightly,  sertaline 15o mg . Returned to Cotempla AM, pramipexole  0.75 mg BID too anxious and reduced to 0.5 mg BID. Tolerating meds now. Patient was administered Spravato 84 mg intranasally today.  The patient experienced the typical dissociation  which gradually resolved over the 2-hour period of observation.  There were no  complications.  Specifically the patient did not have nausea or vomiting or headache.   Trouble staying asleep longer than 6 hours.  Doesn't feel she can tolerate pramipexole 0.75 mg BID DT anxiety not experienced at  0.5 mg BID.  Otherwise tolerating meds.  Mood is better with Spravato and pramipexole.   Not sleeping as long with meds.  EMA 6 hours.  Wonders about change Disc concerns about waiting for therapy. Feels ready to reduce Spravato to weekly.    09/25/22 appt noted: Psych meds:  Clonazepam 0.5 mg tablets 2 nightly, gabapentin dosage varies, Dayvigo 10 mg nightly, trazodone 100 mg nightly,  sertaline 15o mg . Returned to Cotempla AM, pramipexole  0.75 mg BID too anxious and reduced to 0.5 mg BID. Tolerating meds now. Patient was administered Spravato 84 mg intranasally today.  The patient experienced the typical dissociation which gradually resolved over the 2-hour period of observation.  There were no complications.  Specifically the patient did not have nausea or vomiting or headache.   Trouble staying asleep longer than 6 hours.  Failed to respond to Seroquel 25 mg HS.  Gone back to [facility].  Belsomra NR. Mood ok until the last few days more dep bc it's been 10 days since last admin.  Wants to continue weekly.   No SE.  No med changes desired.  09/29/22 appt noted: Psych meds:  Clonazepam 0.5 mg tablets 2 nightly, gabapentin dosage varies, Dayvigo 10 mg nightly, trazodone 100 mg nightly,  sertaline 15o mg . Returned to Cotempla AM, pramipexole  0.75 mg BID too anxious and reduced to 0.5 mg BID. Tolerating meds now. Patient was administered Spravato 84 mg intranasally today.  The patient experienced the typical dissociation which gradually resolved over the 2-hour period of observation.  There were no complications.  Specifically the patient did not have nausea or vomiting or headache.   Trouble staying asleep  longer than 6 hours.  Plan: Continue sertaline to 200 mg daily.  It has helped anxiety but not depression.   for insomnia, clonazepam 1 mg HS. trazodone doesn't work well but helps some Continue Dayvigo 10 HS.  Failed resp Belsomra 15 and seroquel 25 She wants to continue Cotempla or Concerta bc helps mood early in day and focus  benefit lasts longer.  Have not been able to get stimulant to be consitently effective throughout the day. continue pramipexole trial off-label and reduce back to  TRD 0.5 mg BID Spravato weekly.  10/13/22 appt noted: Not sleeping as well.  Wants to increase Spravato to twice weekly bc feeling more dep close to the next sched date.  Just the day or 2 before.  .  Plan: Trial for off label TRD increase pramipexole 1 mg TID  10/20/22 appt noted: Psych meds:  Clonazepam 0.5 mg tablets 2 nightly, gabapentin dosage varies, Dayvigo 10 mg nightly, trazodone 100 mg nightly,  sertaline 15o mg . Returned to Cotempla AM, pramipexole  1 mg TID. Tolerating meds now. Patient was administered Spravato 84 mg intranasally today.  The patient experienced the typical dissociation which gradually resolved over the 2-hour period of observation.  There were no complications.  Specifically the patient did not have nausea or vomiting or headache.   Trouble staying asleep longer than 6 hours.  More dep this week and asked to get Spravato twice this week. Plan: Trial for off label TRD increase pramipexole 1 mg QID  10/22/22 appt noted:  Psych meds:  Clonazepam 0.5 mg tablets 2 nightly and  another one with EMA, gabapentin dosage varies, Belsomra 20 mg daily,  trazodone 100 mg nightly,  sertaline 15o mg . Returned to Cotempla AM, pramipexole  1 mg QID for 2nd day Tolerating meds now.  Except a little N and dizzy briefly after takes it. Patient was administered Spravato 84 mg intranasally today.  The patient experienced the typical dissociation which gradually resolved over the 2-hour period of  observation.  There were no complications.  Specifically the patient did not have nausea or vomiting or headache. No change in mood yet.   Sleep better with Belsomra 20 as long as takes with other meds.  If insomnia mood is worse. Ongoing some dep but gets partial relief with Spravato but would like better relief .  10/27/22 received Spravato 84  11/03/22 appt noted:  Meds as above except sertraline she cut to 100 mg instead of 200 mg daily She doesn't think sertraline helping and worries over poss SE Off and on Cotempla .  Not affecting BP. Doesn't notice benefit or SE with pramipexole 1 mg QID.  No SE Sleep is OK.  Some awakening aftter 6 hours but not enough so takes another clonazepam.   Patient was administered Spravato 84 mg intranasally today.  The patient experienced the typical dissociation which gradually resolved over the 2-hour period of observation.  There were no complications.  Specifically the patient did not have nausea or vomiting or headache. No change in mood yet.   Sleep better with Belsomra 20 as long as takes with other meds.  If insomnia mood is worse. Ongoing some dep but gets partial relief with Spravato but would like better relief .  No sig benefit with pramipexole 1 mg QID.  11/21/22 TC:  To ER with dep 11/21/22. No SE with selegiline but feels more dep.  Wants to stop it. Today is only day 2 of 1 tablet of selegiline 5 mg AM.  Reviewed with her that she had not been satisfied with the response of Spravato and Zoloft and that she is likely feeling worse because the Zoloft is wearing off and in particular because she abruptly stopped 150 mg daily.  She needs to give the selegiline time to work.  She agrees to do so.  She admitted to some passive suicidal thoughts but no active suicidal thoughts and no desire to die.  She agrees to the plan to increase selegiline to 10 mg every morning this weekend and to continue the selegiline trial.  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  She is aware.  Meredith Staggers, MD, DFAPA  12/19/22 TC: Pt called at 1:42p.  She said she wanted to know if she could start weaning off the Selegiline.  She thinks increasing the dosage is making things worse.  She said she wants to go back to [employer].  She acknowleged she has an appt on Monday. I advised her that I wasn't sure Dr Jennelle Human will see her message before her appt, but she still ask me to send the message.  Next appt 8/26 MD resp.  No change until Monday appt.  12/22/22 appt noted: Meds: selegiline up to 5 mg 4 daily for a week but felt it incr irritability and went back to 3 mg daily. She wants to go back to Spravato.  Not near as dep as now.  Selegiline didn't help energy. Still on Belsomra Thinks maybe sertraline helped mood more than she thought. More dep  than anxiety.   Plan: DC selegiline and will retry Effexor which helped in the past at 300 mg daily.  Start this Friday after over 10 half lives of selegiline.    12/24/22 appt noted: Stopped selegiline  Psych med: belsomra 20, clonazepam 1 mg HS.   She has felt more dep off the Spravato though at the time didn't think it was helping.  Has felt more down and withdrawn.  Low energy, motivation, not smiling.  No SI.  Sleep ok.   Patient was administered Spravato 84 mg intranasally today.  The patient experienced the typical dissociation which gradually resolved over the 2-hour period of observation.  There were no complications.  Specifically the patient did not have nausea or vomiting or headache. No problems with BP or other unusual SE Mood is a little better after this administration of Spravato.  Very motivated to continue it.  No immediate med concerns. No SI .  Ongoing anhedonia.  12/25/22 appt noted: Psych med: belsomra 20, clonazepam 1 mg HS.  Not resumed stimulant or AD yet   No SE. Patient was administered Spravato 84 mg intranasally today.  The patient experienced  the typical dissociation which gradually resolved over the 2-hour period of observation.  There were no complications.  Specifically the patient did not have nausea or vomiting or headache.  There is a lot things going on in her life and it is somewhat difficult to separate stressors from what is going on with her mood.  However she is happy to resume Spravato as she decided after stopping briefly that it was helpful.  She plans to start the antidepressant Morrow.  She also wants to resume the stimulant tomorrow.  12/30/22 appt noted; Psych med: belsomra 20, clonazepam 1 mg HS.  Not resumed stimulant.  Effexor XR 37.5 mg daily   No SE. Patient was administered Spravato 84 mg intranasally today.  The patient experienced the typical dissociation which gradually resolved over the 2-hour period of observation.  There were no complications.  Specifically the patient did not have nausea or vomiting or headache.  She is still having depression with very prominent anhedonia.  However she feels the Spravato is somewhat helpful and is hopeful for more complete response as she gets back onto a reasonable antidepressant dose.  She tolerated Effexor XR 300 mg daily in the past and took it for 5 months and it is probably the antidepressants worked best for her.  She is agreeable with this plan.  She is getting sleep benefit from the current medications Plan: incr Effexor 75 mg daily  01/02/23 appt noted: Psych meds as noted above except increase Effexor XR to 75 mg daily without side effects. Patient was administered Spravato 84 mg intranasally today.  The patient experienced the typical dissociation which gradually resolved over the 2-hour period of observation.  There were no complications.  Specifically the patient did not have nausea or vomiting or headache.  Agrees to increasing Effexor as planned.  SX not changed from that noted above.  No SI  01/05/23 appt noted: Psych meds as noted above except increase Effexor  XR to 112.5 mg daily without side effects. Patient was administered Spravato 84 mg intranasally today.  The patient experienced the typical dissociation which gradually resolved over the 2-hour period of observation.  There were no complications.  Specifically the patient did not have nausea or vomiting or headache.  Agrees to increasing Effexor as planned gradually to 300 mg daily.  SX not changed  from that noted above.  Does however feel Spravato has dampened the degree of depression.  No SI Stressful life event discussed and won't be resolved until early next year.  01/07/23 appt noted: Psych meds Effexor XR 112.5 mg daily. Patient was administered Spravato 84 mg intranasally today.  The patient experienced the typical dissociation which gradually resolved over the 2-hour period of observation.  There were no complications.  Specifically the patient did not have nausea or vomiting or headache.  Agrees to increasing Effexor as planned gradually to 300 mg daily.  SX not changed from that noted above.  Does however feel Spravato has dampened the degree of depression markedly compared to when off psych meds and no Spravato.  No SI  01/12/23 appt noted; Psych meds Effexor XR 225 mg daily. No SE notedl. Patient was administered Spravato 84 mg intranasally today.  The patient experienced the typical dissociation which gradually resolved over the 2-hour period of observation.  There were no complications.  Specifically the patient did not have nausea or vomiting.  But is having headache.  Agrees to increasing Effexor as planned gradually to 300 mg daily.  SX not changed from that noted above.  Does however feel Spravato has dampened the degree of depression markedly compared to when off psych meds and no Spravato.  No SI  01/15/23 appt noted:  Psych meds Effexor XR 225 mg daily. No SE noted. Patient was administered Spravato 84 mg intranasally today.  The patient experienced the typical dissociation which  gradually resolved over the 2-hour period of observation.  There were no complications.  Specifically the patient did not have nausea or vomiting.   Doing ok with higher doses of Effexor and will plan to increase to 300 mg daily. Mild improvement noticed so far with the incrase in meds.   Uses Cotempla prn and it helps but doesn't liek the way she feels when it wears off.  01/22/23 appt noted: Psych meds reduced Effexor XR 150 mg daily. Bc of HA No SE noted. Patient was administered Spravato 84 mg intranasally today.  The patient experienced the typical dissociation which gradually resolved over the 2-hour period of observation.  There were no complications.  Specifically the patient did not have nausea or vomiting.  Ongoing moderate levels of depression with some improvement from the Spravato.  She is not sure about the effect of the venlafaxine on her mood yet.  She would like to continue to try pursuing a stimulant but has had difficulty tolerating p.o. versions of stimulants because of the crash at the end and side effects with many of them.  She has several questions about this.  01/27/23 appt noted: Psych meds: Clonazepam 1 to 1.5 mg nightly, lithium CR 300 nightly, Belsomra 20 nightly, venlafaxine XR 150 every morning No side effects noted at the current dosage. Patient was administered Spravato 84 mg intranasally today.  The patient experienced the typical dissociation which gradually resolved over the 2-hour period of observation.  There were no complications.  Specifically the patient did not have nausea or vomiting.  Ongoing moderate levels of depression with some improvement from the Spravato.   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  She does not tolerate oral stimulants very well because she has a crash as they wear off which is very dysphoric.  She is willing to retry the Daytrana patches that she took in the spring.   She took 1 patch  on 1 occasion recently and felt it was helpful. However she is also having problems with early morning awakening over the last week without precipitant.  She would like to have a different treatment strategy for sleep but understands her sleep problems have historically been difficult to resolve and maintain.  02/10/23 appt noted: Psych meds: Clonazepam 1 to 1.5 mg nightly, lithium CR 300 nightly, Belsomra 20 nightly, venlafaxine XR 150 every morning, Daytrana 30 mg Am No side effects noted at the current dosage. Patient was administered Spravato 84 mg intranasally today.  The patient experienced the typical dissociation which gradually resolved over the 2-hour period of observation.  There were no complications.  Specifically the patient did not have nausea or vomiting.  Ongoing moderate levels of depression with some improvement from the Spravato.  Particularly anhedonia.  No sig benefit noted with Daytrana and problems with table stimulants bc gets brief benefit with unpleasant "crash".  Wants to try another type of patch if available.  02/17/23 appt noted: Psych meds: Clonazepam 1 to 1.5 mg nightly, lithium CR 300 nightly, Belsomra 20 nightly, venlafaxine XR 225 mg every morning, Daytrana 30 mg Am No side effects noted at the current dosage. Patient was administered Spravato 84 mg intranasally today.  The patient experienced the typical dissociation which gradually resolved over the 2-hour period of observation.  There were no complications.  Specifically the patient did not have nausea or vomiting.  Ongoing moderate levels of depression with some improvement from the Spravato.  Particularly anhedonia.  She has not gotten the new stimulant yet but is hopeful. No problems with increasing venlafaxine.   Mood is better with Spravato and venlafaxine.  Less anhedonia, less down. Themazepam not helpful.    03/04/23 received Spravato 84 03/12/23 received Spravato 84  03/19/23 appt  noted: Psych meds: Clonazepam 1 to 1.5 mg nightly, lithium CR 300 nightly, Belsomra 20 nightly, venlafaxine XR 300 mg every morning, tried various stimulants lately No side effects noted at the current dosage. Patient was administered Spravato 84 mg intranasally today.  The patient experienced the typical dissociation which gradually resolved over the 2-hour period of observation.  There were no complications.  Specifically the patient did not have nausea or vomiting.  Not getting good duration from oral stimulants.  She would like to try another patch but has to take tabs first and try them before insurance will cover patches.  Overall dep is better with meds and Spravato.  Clearly sees benefit.  03/30/23 appt noted: Psych meds: Clonazepam 1 to 1.5 mg nightly, lithium CR 300 nightly,  venlafaxine XR 300 mg every morning, tried various stimulants lately, gabapentin 900 mg HS, dexmethylphenidate ER without help No side effects noted at the current dosage. Patient was administered Spravato 84 mg intranasally today.  The patient experienced the typical dissociation which gradually resolved over the 2-hour period of observation.  There were no complications.  Specifically the patient did not have nausea or vomiting.  Able to leave at end of 2 hour period without assistance.  Clear mood benefit with Spravato with much less dep and wants to continue. Still trouble sleeping. TR insomnia.  Ongoing conc problems with minimal brief benefit from stimulants.  Not able to get patch Caprice Red yet DT insurance.  04/14/23 appt noted: Psych meds: Clonazepam 1 to 1.5 mg nightly, lithium CR 300 nightly,  she reduced on her own venlafaxine XR to 150 mg every morning, tried various stimulants lately, gabapentin 900 mg HS, dexmethylphenidate ER without help No  side effects noted at the current dosage. Patient was administered Spravato 84 mg intranasally today.  The patient experienced the typical dissociation which  gradually resolved over the 2-hour period of observation.  There were no complications.  Specifically the patient did not have nausea or vomiting.  Able to leave at end of 2 hour period without assistance.  Clear mood benefit with Spravato with much less dep and wants to continue. she went to [facility] and had her brain scanned at [facility]. they recommend she taper off Effexor which she is only taking 150 mg currently and start Auvelity. She reduce Effexor on her own to 150 mg daily. She wants further improvement in anhedonia.    04/28/23 received Spravato 84  05/11/23 appt noted: Psych meds: Clonazepam 1 to 1.5 mg nightly, lithium CR 300 nightly,  venlafaxine XR to 150 mg every morning, Auvelity BID, tried various stimulants lately, gabapentin 900 mg HS, dexmethylphenidate ER without help No side effects noted at the current dosage. Patient was administered Spravato 84 mg intranasally today.  The patient experienced the typical dissociation which gradually resolved over the 2-hour period of observation.  There were no complications.  Specifically the patient did not have nausea or vomiting.  Able to leave at end of 2 hour period without assistance.  Clear mood benefit with Spravato with much less dep and wants to continue. Overall her mood is still significantly better with the combination of Spravato venlafaxine and Auvelity in particular.  She has not had any problems from the reduction in venlafaxine but is not dramatically improved with regard to residual depression since adding Auvelity but she is not having side effects.  We agreed that a longer trial of the helpful.  She is also wondering about increasing the Spravato frequency from once every other week to once every 10 days in order to prove improved residual depression and specifically anhedonia.  Overall though she is dramatically better with the current combination of meds. Plan: continue venlafaxine ER 150 with Auvelity BID bc  TRD  05/25/23 appt noted: Psych meds: Clonazepam 1 to 1.5 mg nightly, lithium CR 300 nightly,  venlafaxine XR to 150 mg every morning, Auvelity BID, tried various stimulants lately, gabapentin 900 mg HS, dexmethylphenidate ER without help No side effects noted at the current dosage. Patient was administered Spravato 84 mg intranasally today.  The patient experienced the typical dissociation which gradually resolved over the 2-hour period of observation.  There were no complications.  Specifically the patient did not have nausea or vomiting.  Able to leave at end of 2 hour period without assistance.  Clear mood benefit with Spravato with much less dep and wants to continue.  06/04/23 appt noted: Psych meds: Clonazepam 1 to 1.5 mg nightly, lithium CR 300 nightly,  venlafaxine XR to 150 mg every morning, Auvelity 1 in the AM, tried various stimulants lately, gabapentin 900 mg HS, no current stimulant (Vyvanse 40 NR) She reduced Auvelity to 1 daily DT tremor she thinks it made worse and bc no benefit noted with it. She is interested in increasing the Effexor at 300 mg daily bc did get some benefit from it in the past and tolerated that dos. Chronic dep is better with Spravato but not gone.  Some anhedonia.  Focus is still not good. Problems getting patch ADD med. Patient was administered Spravato 84 mg intranasally today.  The patient experienced the typical dissociation which gradually resolved over the 2-hour period of observation.  There were no complications.  Specifically the patient did not have nausea or vomiting.  Able to leave at end of 2 hour period without assistance.  Clear mood benefit with Spravato with much less dep and wants to continue.  06/16/23 appt noted: Psych meds: Clonazepam 1 to 1.5 mg nightly, lithium CR 300 nightly,  venlafaxine XR didn't yet increase so  225 mg every morning, , tried various stimulants lately, gabapentin 900 mg HS, no current stimulant (Vyvanse 40 NR), trazodone 50  HS Chronic dep is better with Spravato but not gone.  Some anhedonia.  Focus is still not good. Problems getting patch ADD med. Patient was administered Spravato 84 mg intranasally today.  The patient experienced the typical dissociation which gradually resolved over the 2-hour period of observation.  There were no complications.  Specifically the patient did not have nausea or vomiting.  Able to leave at end of 2 hour period without assistance.  Clear mood benefit with Spravato with much less dep and wants to continue. She has not increased venlafaxine to 300 mg yet but is eager to do so.  She still has some sleep issues and wonders if trazodone is working.  She has no side effect with current medications.  She would like to try an alternative med for ADD since the stimulants have not been effective. Plan: increase venlafaxine XR to 300 mg every morning  06/29/23 appt noted: Med: Clonazepam 1 to 1.5 mg nightly, lithium CR 300 nightly,  venlafaxine XR 300 mg every morning, , tried various stimulants lately, gabapentin 900 mg HS, no current stimulant (Vyvanse 40 NR), trazodone 50 HS No SE Patient was administered Spravato 84 mg intranasally today.  The patient experienced the typical dissociation which gradually resolved over the 2-hour period of observation.  There were no complications.  Specifically the patient did not have nausea or vomiting.  Able to leave at end of 2 hour period without assistance.  Clear mood benefit with Spravato with much less dep and wants to continue.  07/10/23 appt noted: Med: Clonazepam 1 to 1.5 mg nightly, lithium CR 300 nightly,  venlafaxine XR 300 mg every morning, , tried various stimulants lately, gabapentin 900 mg HS, no current stimulant , trazodone 50 HS No SE Patient was administered Spravato 84 mg intranasally today.  The patient experienced the typical dissociation which gradually resolved over the 2-hour period of observation.  There were no complications.   Specifically the patient did not have nausea or vomiting.  Able to leave at end of 2 hour period without assistance.  Partial response with Spravato is sig but not complete.  Disc questions about stimulant vs Strattera to help more with dep and ADD and productivity. Plan: bc can't get stimulant right now will try Strattera  07/22/23 appt noted: Med: Clonazepam 1 to 1.5 mg nightly, lithium CR 300 nightly,  venlafaxine XR 300 mg every morning,  gabapentin 900 mg HS, no current stimulant & never took Strattera, trazodone 50 HS No SE Patient was administered Spravato 84 mg intranasally today.  The patient experienced the typical dissociation which gradually resolved over the 2-hour period of observation.  There were no complications.  Specifically the patient did not have nausea or vomiting.  Able to leave at end of 2 hour period without assistance.  Mood still 50% or so better with spravato and current meds. Still wants to try stimulant and Noemi [place] just approved finally. Wants to stop some meds bc doesn't like taking a lot of meds.  Most immediate concern is intermittent  tremor. 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  Disc risk relapse.  I rec she continue bc of this but she wants to stop anyway.    08/04/23 appt noted: Med: Clonazepam 1 to 1.5 mg nightly,  venlafaxine XR 225 mg every morning,  gabapentin 900 mg HS, trazodone 50 HS, Xelstrym No SE Patient was administered Spravato 84 mg intranasally today.  The patient experienced the typical dissociation which gradually resolved over the 2-hour period of observation.  There were no complications.  Specifically the patient did not have nausea or vomiting.  Able to leave at end of 2 hour period without assistance.  Mood still 50% or so better with spravato and current meds. continue Xelstrym just approved finally. Mood is better with meds and doesn't want to change.   She mentions  insurance will end in June and needs to change.  AIMS    Flowsheet Row Video Visit from 01/03/2022 in [facility]  AIMS Total Score 0      ECT-MADRS    Flowsheet Row Clinical Support from 08/13/2022 in [facility] Video Visit from 04/29/2022 in [facility] Video Visit from 02/06/2022 in [facility]  MADRS Total Score [phone]      GAD-7    Flowsheet Row Counselor from 12/19/2022 in [facility] Office Visit from 08/22/2022 in [facility] [place]  Total GAD-7 Score 4 7      PHQ2-9    Flowsheet Row Counselor from 12/19/2022 in [facility] Office Visit from 08/22/2022 in [facility] [place] Office Visit from 08/19/2022 in [facility] Video Visit from 02/06/2022 in [facility] Office Visit from 11/16/2020 in [facility] at [facility] Total Score 2 3 0 6 0  PHQ-9 Total Score 5 5 -- 14 --        Past Psychiatric Medication Trials: Trintellix - Initially effective and then was not effective when re-started Paxil- Ineffective. Helped initially. Prozac-Insomnia Lexapro- Effective and then no longer as effective Zoloft- Initially effective and then minimal response Viibryd Cymbalta- Ineffective. Helped initially. Effexor XR 300- Effective, well tolerated. Pristiq- Increased anxiety at 150 mg daily Wellbutrin XL-Ineffective.May have increased anxiety. Amitriptyline Nortriptyline-Caused irritability, constipation Auvelity- ineffective 2 trials Selegilne brief 20 mg daily , more irritable  Abilify- Helpful but caused severe insomnia. Rexulti-Helpful but caused insomnia. Vraylar Seroquel - Ineffective Risperdal Olanzapine- Ineffective Latuda-  Ineffective Caplyta  Klonopin- Effective Temazepam- Took during menopause Lunesta- Ineffective Ambien-Ineffective Sonata- Ineffective Dayvigo- partially effective  Belsomra 20 worked and then failed  Trazodone-  somewhat effective Remeron-Ineffective. Does not recall wt gain.  Quetiapine 25 NR  Adderall- Took once and had adverse effect. Concerta- Effective but only 4-6 hours Jornay 80 NR Metadate-Not as effective as Concerta Dexmethylphenidate IR NR Dexmethylphenidate ER 10/02/21- Not as effective as Concerta Azstarys- some side effects. Not as effective.  Ritalin short acting and SE anxiety & HTN @20  TID Cotempla 17.3  Daytrana NR Vyvanse 40 NR  Lamictal- May have had an adverse effects. "Felt weird." Reports worsening s/s.  Lithium ? effect Gabapentin Pramipexole- 1 mg QID NR  ECT #20 with minimal response. H says it was partly helpful.  AIMS    Flowsheet Row Video Visit  from 01/03/2022 in [facility]  AIMS Total Score 0      ECT-MADRS    Flowsheet Row Clinical Support from 08/13/2022 in [facility] Video Visit from 04/29/2022 in [facility] Video Visit from 02/06/2022 in [facility]  MADRS Total Score 27 44 23      GAD-7    Flowsheet Row Counselor from 12/19/2022 in [facility] Office Visit from 08/22/2022 in [facility] [place]  Total GAD-7 Score 4 7      PHQ2-9    Flowsheet Row Counselor from 12/19/2022 in [facility] Office Visit from 08/22/2022 in [facility] [place] Office Visit from 08/19/2022 in [facility] Video Visit from 02/06/2022 in [facility] Office Visit from 11/16/2020 in [facility]  PHQ-2 Total Score 2  3 0 6 0  PHQ-9 Total Score 5 5 -- 14 --        Review of Systems:  Review of Systems  Constitutional:  Positive for fatigue.  HENT:  Negative for dental problem.   Cardiovascular:  Negative for palpitations.  Neurological:  Positive for tremors. Negative for dizziness and headaches.  Psychiatric/Behavioral:  Positive for decreased concentration, dysphoric mood and sleep disturbance. Negative for agitation, behavioral problems, confusion, hallucinations and suicidal ideas. The patient is nervous/anxious.     Medications: I have reviewed the patient's current medications.  Current Outpatient Medications  Medication Sig Dispense Refill  . clonazePAM (KLONOPIN) 1 MG tablet Take 1-2 tablets (1-2 mg total) by mouth at bedtime. 60 tablet 0  . Dextroamphetamine (XELSTRYM) 13.5 MG/9HR PTCH Place 1 patch onto the skin every morning. 30 patch 0  . Esketamine HCl, 84 MG Dose, (SPRAVATO, 84 MG DOSE,) 28 MG/DEVICE SOPK USE 3 SPRAYS IN EACH NOSTRIL ONCE A WEEK 3 each 1  . Estradiol (VAGIFEM) 10 MCG TABS vaginal tablet Place 1 tablet (10 mcg total) vaginally 2 (two) times a week. 24 tablet 3  . gabapentin (NEURONTIN) 300 MG capsule 2 at bedtime and 1-2 capsule if need for early morning awakening. 120 capsule 1  . lithium carbonate (LITHOBID) 300 MG ER tablet TAKE 2 TABLETS BY MOUTH AT BEDTIME (Patient taking differently: Take 300 mg by mouth at bedtime.) 180 tablet 0  . traZODone (DESYREL) 50 MG tablet TAKE 1 TABLET BY MOUTH AT BEDTIME 30 tablet 0  . valACYclovir (VALTREX) 500 MG tablet Take one twice daily for 3 days with any symptoms 30 tablet 3  . valsartan (DIOVAN) 80 MG tablet Take 1 tablet (80 mg total) by mouth daily. 90 tablet 3  . venlafaxine XR (EFFEXOR-XR) 150 MG 24 hr capsule Take 2 capsules (300 mg total) by mouth daily with breakfast. 60 capsule 2   No current facility-administered medications for this visit.    Medication Side Effects: None  Allergies:  Allergies  Allergen  Reactions  . Ciprofloxacin Hives  . Oxycodone-Acetaminophen Hives    Past Medical History:  Diagnosis Date  . Adult acne   . Anemia   . Anorexia nervosa teen  . DEPRESSION 08/09/2009  . Hematoma 09/2020   post op after face lift  . Insomnia   . 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  . TRANSAMINASES, SERUM, ELEVATED 08/14/2009    Past Medical History,  Surgical history, Social history, and Family history were reviewed and updated as appropriate.   Please see review of systems for further details on the patient's review from today.   Objective:   Physical Exam:  LMP 12/27/2007 (Exact Date)   Physical Exam Constitutional:      General: She is not in acute distress. Musculoskeletal:        General: No deformity.  Neurological:     Mental Status: She is alert and oriented to person, place, and time.     Motor: Tremor present.     Coordination: Coordination normal.     Comments: Minimal tremor  Psychiatric:        Attention and Perception: Perception normal. She is inattentive. She does not perceive auditory hallucinations.        Mood and Affect: Mood is anxious and depressed. Affect is blunt. Affect is not tearful.        Speech: Speech normal.        Behavior: Behavior normal.        Thought Content: Thought content normal. Thought content is not delusional. Thought content does not include homicidal or suicidal ideation. Thought content does not include suicidal plan.        Cognition and Memory: Cognition and memory normal.     Comments: Insight  and judgment fair.  Tendency to impulsive decisions 4/10 dep but better with Spravato markedly. Affect blunted chronically    Lab Review:     Component Value Date/Time   NA 132 (L) 10/27/2022 1107   K 4.4 10/27/2022 1107   CL 95 (L) 10/27/2022 1107   CO2 30 10/27/2022 1107   GLUCOSE 89 10/27/2022 1107   BUN 14 10/27/2022 1107   CREATININE 0.67 10/27/2022 1107   CREATININE  0.68 01/26/2020 0920   CALCIUM 10.1 10/27/2022 1107   PROT 8.3 04/22/2022 0932   ALBUMIN 5.3 (H) 04/22/2022 0932   AST 82 (H) 04/22/2022 0932   ALT 118 (H) 04/22/2022 0932   ALKPHOS 74 04/22/2022 0932   BILITOT 0.3 04/22/2022 0932   GFRNONAA >90 05/01/2014 1645   GFRAA >90 05/01/2014 1645       Component Value Date/Time   WBC 3.3 (L) 04/22/2022 0932   RBC 4.25 04/22/2022 0932   HGB 14.3 04/22/2022 0932   HGB 14.1 10/26/2014 1034   HCT 40.7 04/22/2022 0932   PLT 245.0 04/22/2022 0932   MCV 95.7 04/22/2022 0932   MCH 34.4 (H) 01/26/2020 0920   MCHC 35.1 04/22/2022 0932   RDW 13.2 04/22/2022 0932   LYMPHSABS 0.9 04/22/2022 0932   MONOABS 0.2 04/22/2022 0932   EOSABS 0.1 04/22/2022 0932   BASOSABS 0.0 04/22/2022 0932    No results found for: "POCLITH", "LITHIUM"   No results found for: "PHENYTOIN", "PHENOBARB", "VALPROATE", "CBMZ"   .res Assessment: Plan:    Toniya was seen today for follow-up, depression, anxiety, add, stress and sleeping problem.  Diagnoses and all orders for this visit:  Recurrent major depression resistant to treatment (HCC) -     venlafaxine XR (EFFEXOR-XR) 150 MG 24 hr capsule; Take 2 capsules (300 mg total) by mouth daily with breakfast.  Generalized anxiety disorder -     venlafaxine XR (EFFEXOR-XR) 150 MG 24 hr capsule; Take 2 capsules (300 mg total) by mouth daily with breakfast.  Insomnia, unspecified type  Attention deficit hyperactivity disorder (ADHD), predominantly inattentive type  Tremor due to multiple drugs      Pt seen for TRD with multiple failed medications  as noted above.    Mood 50% better with venlafaxine with Spravato but ongoing dep with anhedonia and fatigue.    There is an ongoing pattern of her making impulsive decisions on her own about meds which are counter therapeutic and not informing MD of what is going on until after the fact.  That is not a helpful pattern and she is encouraged to stop it.   There  isongoing problem.  Patient was administered Spravato 84 mg intranasally today.  The patient experienced the typical dissociation which gradually resolved over the 2-hour period of observation.  There were no complications.  Specifically the patient did not have nausea or vomiting or headache.  Blood pressures remained within normal ranges at the 40-minute and 2-hour follow-up intervals.  By the time the 2-hour observation period was met the patient was alert and oriented and able to exit without assistance.  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  See nursing note for further details. Would like to increase frequency from every other week to every 10 days looking for further improvement in depression.  Consider switch Spravato to MAOI, Parnate  We discussed the short-term risks associated with benzodiazepines including sedation and increased fall risk among others.  Discussed long-term side effect risk including dependence, potential withdrawal symptoms, and the potential eventual dose-related risk of dementia.  But recent studies from 2020 dispute this association between benzodiazepines and dementia risk. Newer studies in 2020 do not support an association with dementia.  for insomnia, wants to continue clonazepam 2 mg nightly. Can't sleep without it. Wants to continue trazodone also  Reports needing gabapentin [phone] mg HS to help sleep.   Conisder switching to patch bc pt has significant crash with stimulants or perhaps [place].  Stimulant tablets cause crash with inadequate duration .  Patches should provide smoother response. The following is finally approved and she will try it. continue Xelstrym bc it is helping more than any other stimulant she tried with some mood benefit..    Discussed potential benefits, risks, and side effects of stimulants with patient to include increased heart rate,  palpitations, insomnia, increased anxiety, increased irritability, or decreased appetite.  Instructed patient to contact office if experiencing any significant tolerability issues.  Plan: continue Clonazepam 1 to 1.5 mg nightly, lithium CR 300 nightly,   tried various stimulants lately, gabapentin 900 mg HS, trazodone 50 HS. increased venlafaxine XR to 300 mg every morning then she reduced to 225  for ? reasons  Disc decisions about new insurance which will happen end of June.  Disc making sure it will cover current tx plan and complications if it doesn't.  FU every 14 days  Meredith Staggers, MD, DFAPA    Please see After Visit Summary for patient specific instructions.  Future Appointments  Date Time Provider Department Center  08/21/2023  8:00 AM Caryl Never, Elberta Fortis, MD [facility]  09/18/2023  8:15 AM Jerene Bears, MD [facility]    No orders of the defined types were placed in this encounter.   -------------------------------

## 2023-08-05 ENCOUNTER — Telehealth: Payer: Self-pay

## 2023-08-05 ENCOUNTER — Other Ambulatory Visit: Payer: Self-pay

## 2023-08-05 DIAGNOSIS — G4700 Insomnia, unspecified: Secondary | ICD-10-CM

## 2023-08-05 MED ORDER — CLONAZEPAM 1 MG PO TABS: 1.0000 mg | ORAL_TABLET | Freq: Every day | ORAL | 0 refills | Status: DC

## 2023-08-05 NOTE — Telephone Encounter (Signed)
 Prior Approval received for Venlafaxine ER 150 mg 2 capsules daily #60 with [employer] effective 07/06/23-08/04/2024, PA# [id number]

## 2023-08-05 NOTE — Telephone Encounter (Signed)
 Prior Authorization submitted for Venlafaxine XR 150 mg 2 capsules daily to [employer], pending response

## 2023-08-06 ENCOUNTER — Other Ambulatory Visit: Payer: Self-pay | Admitting: Psychiatry

## 2023-08-06 DIAGNOSIS — F339 Major depressive disorder, recurrent, unspecified: Secondary | ICD-10-CM

## 2023-08-06 DIAGNOSIS — F411 Generalized anxiety disorder: Secondary | ICD-10-CM

## 2023-08-06 NOTE — Telephone Encounter (Signed)
 Wrong dose

## 2023-08-21 ENCOUNTER — Encounter: Payer: Self-pay | Admitting: Family Medicine

## 2023-08-26 ENCOUNTER — Other Ambulatory Visit: Payer: Self-pay

## 2023-08-26 DIAGNOSIS — G4700 Insomnia, unspecified: Secondary | ICD-10-CM

## 2023-08-26 DIAGNOSIS — F419 Anxiety disorder, unspecified: Secondary | ICD-10-CM

## 2023-08-26 MED ORDER — GABAPENTIN 300 MG PO CAPS: ORAL_CAPSULE | ORAL | 1 refills | Status: DC

## 2023-08-26 MED ORDER — CLONAZEPAM 1 MG PO TABS: 1.0000 mg | ORAL_TABLET | Freq: Every day | ORAL | 1 refills | Status: AC

## 2023-08-26 NOTE — Telephone Encounter (Signed)
 Pt contacted nurse to cancel her Spravato  Treatment scheduled for tomorrow 08/27/23, pt reports she is doing well and didn't feel like she needed a treatment yet. Advised her to reach out next week if she wanted to get scheduled. Her last treatment was on 08/04/23.  Pt also requesting refill for Gabapenitn 600 mg and Clonazepam  1 mg to [facility] in [place].

## 2023-08-26 NOTE — Telephone Encounter (Signed)
 Since she is putting pause on Spravato , she needs to schedule office appt with me next available.

## 2023-08-27 ENCOUNTER — Encounter: Admitting: Psychiatry

## 2023-08-27 ENCOUNTER — Encounter

## 2023-08-28 ENCOUNTER — Encounter: Payer: Self-pay | Admitting: Family Medicine

## 2023-08-28 ENCOUNTER — Ambulatory Visit (INDEPENDENT_AMBULATORY_CARE_PROVIDER_SITE_OTHER): Payer: Self-pay | Admitting: Family Medicine

## 2023-08-28 ENCOUNTER — Other Ambulatory Visit: Payer: Self-pay

## 2023-08-28 VITALS — BP 104/60 | Temp 98.2°F | Ht 60.63 in | Wt 98.4 lb

## 2023-08-28 DIAGNOSIS — Z0000 Encounter for general adult medical examination without abnormal findings: Secondary | ICD-10-CM

## 2023-08-28 DIAGNOSIS — Z8249 Family history of ischemic heart disease and other diseases of the circulatory system: Secondary | ICD-10-CM | POA: Diagnosis not present

## 2023-08-28 LAB — BASIC METABOLIC PANEL WITH GFR
BUN: 17 mg/dL (ref 6–23)
CO2: 32 meq/L (ref 19–32)
Calcium: 9.6 mg/dL (ref 8.4–10.5)
Chloride: 97 meq/L (ref 96–112)
Creatinine, Ser: 0.61 mg/dL (ref 0.40–1.20)
GFR: 96.52 mL/min (ref >60.00)
Glucose, Bld: 92 mg/dL (ref 70–99)
Potassium: 4.1 meq/L (ref 3.5–5.1)
Sodium: 135 meq/L (ref 135–145)

## 2023-08-28 LAB — HEPATIC FUNCTION PANEL
ALT: 73 U/L — ABNORMAL HIGH (ref 0–35)
AST: 60 U/L — ABNORMAL HIGH (ref 0–37)
Albumin: 4.7 g/dL (ref 3.5–5.2)
Alkaline Phosphatase: 54 U/L (ref 39–117)
Bilirubin, Direct: 0.1 mg/dL (ref 0.0–0.3)
Total Bilirubin: 0.3 mg/dL (ref 0.2–1.2)
Total Protein: 7.4 g/dL (ref 6.0–8.3)

## 2023-08-28 LAB — CBC WITH DIFFERENTIAL/PLATELET
Basophils Absolute: 0 10*3/uL (ref 0.0–0.1)
Basophils Relative: 1.1 % (ref 0.0–3.0)
Eosinophils Absolute: 0.1 10*3/uL (ref 0.0–0.7)
Eosinophils Relative: 4.1 % (ref 0.0–5.0)
HCT: 39.8 % (ref 36.0–46.0)
Hemoglobin: 13.2 g/dL (ref 12.0–15.0)
Lymphocytes Relative: 34.4 % (ref 12.0–46.0)
Lymphs Abs: 1.1 10*3/uL (ref 0.7–4.0)
MCHC: 33.2 g/dL (ref 30.0–36.0)
MCV: 97.8 fl (ref 78.0–100.0)
Monocytes Absolute: 0.3 10*3/uL (ref 0.1–1.0)
Monocytes Relative: 9.7 % (ref 3.0–12.0)
Neutro Abs: 1.6 10*3/uL (ref 1.4–7.7)
Neutrophils Relative %: 50.7 % (ref 43.0–77.0)
Platelets: 254 10*3/uL (ref 150.0–400.0)
RBC: 4.07 Mil/uL (ref 3.87–5.11)
RDW: 13.3 % (ref 11.5–15.5)
WBC: 3.1 10*3/uL — ABNORMAL LOW (ref 4.0–10.5)

## 2023-08-28 LAB — LIPID PANEL
Cholesterol: 250 mg/dL — ABNORMAL HIGH (ref 0–200)
HDL: 48.2 mg/dL (ref >39.00)
LDL Cholesterol: 183 mg/dL — ABNORMAL HIGH (ref 0–99)
NonHDL: 202.06
Total CHOL/HDL Ratio: 5
Triglycerides: 97 mg/dL (ref 0.0–149.0)
VLDL: 19.4 mg/dL (ref 0.0–40.0)

## 2023-08-28 MED ORDER — XELSTRYM 13.5 MG/9HR TD PTCH: 1.0000 | MEDICATED_PATCH | Freq: Every day | TRANSDERMAL | 0 refills | Status: DC

## 2023-08-28 NOTE — Progress Notes (Signed)
 Established Patient Office Visit  Subjective   Patient ID: Heather Davila, female    DOB: August 13, 1960  Age: 63 y.o. MRN: [id number]  Chief Complaint  Patient presents with   Annual Exam    HPI   Heather Davila is seen for physical exam.  She has past history of depression and had being seen psychiatry and was on multiple medications but has been able to come off most of these within the past year.  She is pleased with that.  Feels like her mood is very stable at this time.  Remains on Effexor  but was able to come off lithium  and several other medications.  She has had prior history of elevated blood pressure but now only takes Diovan  occasionally.  She does see GYN regularly.  Her GYN had recently mentioned possible coronary calcium score based on the fact that her father had coronary disease around age 55 and also a sister with questionable coronary disease in her 19s.  Heather Davila has longstanding history of hyperlipidemia but no statin therapy.  She denies any recent chest pains. He does have history of colon polyps and is apparently overdue for repeat colonoscopy.  She is aware of that.  Has had previous shingles vaccine.  Getting every other year mammogram.  Health Maintenance  Topic Date Due   Colonoscopy  10/04/2021   COVID-19 Vaccine (3 - 2024-25 season) 12/28/2022   MAMMOGRAM  09/11/2023   INFLUENZA VACCINE  11/27/2023   Cervical Cancer Screening (HPV/Pap Cotest)  08/19/2027   DTaP/Tdap/Td (3 - Td or Tdap) 03/05/2028   Hepatitis C Screening  Completed   HIV Screening  Completed   Zoster Vaccines- Shingrix  Completed   HPV VACCINES  Aged Out   Meningococcal B Vaccine  Aged Out   Social History   Socioeconomic History   Marital status: Married    Spouse name: Charles   Number of children: 3   Years of education: Master's   Highest education level: Master's degree (e.g., MA, MS, MEng, MEd, MSW, MBA)  Occupational History   Occupation: Academic librarian: [employer]   Tobacco Use   Smoking status: Former    Current packs/day: 0.00    Average packs/day: 0.5 packs/day for 5.0 years (2.5 ttl pk-yrs)    Types: Cigarettes    Start date: 07/10/1982    Quit date: 07/10/1987    Years since quitting: 36.1   Smokeless tobacco: Never  Vaping Use   Vaping status: Never Used  Substance and Sexual Activity   Alcohol use: No   Drug use: No   Sexual activity: Yes    Partners: Male    Birth control/protection: Post-menopausal, Other-see comments    Comment: vasectomy  Other Topics Concern   Not on file  Social History Narrative   Patient is married Scientist, physiological) and lives at home with her husband and two children.   Patient has three children.   Patient works at [employer].   Patient has a Master's degree.   Patient is left-handed.   Patient drinks 6 cups of coffee daily.   Social Drivers of Corporate investment banker Strain: Low Risk  (08/21/2022)   Overall Financial Resource Strain (CARDIA)    Difficulty of Paying Living Expenses: Not hard at all  Food Insecurity: No Food Insecurity (08/21/2022)   Hunger Vital Sign    Worried About Running Out of Food in the Last Year: Never true    Ran Out of Food in the Last  Year: Never true  Transportation Needs: No Transportation Needs (08/21/2022)   PRAPARE - Administrator, Civil Service (Medical): No    Lack of Transportation (Non-Medical): No  Physical Activity: Unknown (08/21/2022)   Exercise Vital Sign    Days of Exercise per Week: 0 days    Minutes of Exercise per Session: Not on file  Stress: No Stress Concern Present (08/21/2022)   [employer] of Occupational Health - Occupational Stress Questionnaire    Feeling of Stress : Not at all  Social Connections: Moderately Integrated (08/21/2022)   Social Connection and Isolation Panel [NHANES]    Frequency of Communication with Friends and Family: More than three times a week    Frequency of Social Gatherings with Friends and Family:  Twice a week    Attends Religious Services: More than 4 times per year    Active Member of [employer] or Organizations: No    Attends Engineer, structural: Not on file    Marital Status: Married  Catering manager Violence: Not on file   Family History  Problem Relation Age of Onset   Cancer Mother 60       colon cancer   Hypertension Mother    Colon cancer Mother 50       colon rescection   Colon polyps Mother    Hypertension Father    Heart disease Father 90       CABG62   Stroke Father 81   Heart disease Sister        atrial fibrillation   Heart attack Sister 75       no stents   Depression Sister    Heart failure Paternal Aunt    Diabetes Paternal Uncle    Heart disease Paternal Grandfather    Esophageal cancer Neg Hx    Rectal cancer Neg Hx    Stomach cancer Neg Hx    Past Medical History:  Diagnosis Date   Adult acne    Anemia    Anorexia nervosa teen   DEPRESSION 08/09/2009   Hematoma 09/2020   post op after face lift   Insomnia    STD (sexually transmitted disease) 08/05/2013   HSV2?, 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   TRANSAMINASES, SERUM, ELEVATED 08/14/2009   Past Surgical History:  Procedure Laterality Date   AUGMENTATION MAMMAPLASTY Bilateral    BREAST ENHANCEMENT SURGERY Bilateral    BUNIONECTOMY     CESAREAN SECTION     X 3   FACELIFT  2022   HYSTEROSCOPY  12/27/2007   and HTA   UPPER GASTROINTESTINAL ENDOSCOPY  2020    reports that she quit smoking about 36 years ago. Her smoking use included cigarettes. She started smoking about 41 years ago. She has a 2.5 pack-year smoking history. She has never used smokeless tobacco. She reports that she does not drink alcohol and does not use drugs. family history includes Cancer (age of onset: 85) in her mother; Colon cancer (age of onset: 27) in her mother; Colon polyps in her mother; Depression in her sister; Diabetes in her paternal uncle; Heart attack (age of onset: 75) in her sister; Heart disease in  her paternal grandfather and sister; Heart disease (age of onset: 70) in her father; Heart failure in her paternal aunt; Hypertension in her father and mother; Stroke (age of onset: 1) in her father. Allergies  Allergen Reactions   Ciprofloxacin Hives   Oxycodone-Acetaminophen  Hives    Review of Systems  Constitutional:  Negative for chills, fever, malaise/fatigue and weight loss.  HENT:  Negative for hearing loss.   Eyes:  Negative for blurred vision and double vision.  Respiratory:  Negative for cough and shortness of breath.   Cardiovascular:  Negative for chest pain, palpitations and leg swelling.  Gastrointestinal:  Negative for abdominal pain, blood in stool, constipation and diarrhea.  Genitourinary:  Negative for dysuria.  Skin:  Negative for rash.  Neurological:  Negative for dizziness, speech change, seizures, loss of consciousness and headaches.  Psychiatric/Behavioral:  Negative for depression.       Objective:     BP 104/60 (BP Location: Left Arm, Patient Position: Sitting, Cuff Size: Normal)   Temp 98.2 F (36.8 C) (Oral)   Ht 5' 0.63" (1.54 m)   Wt 98 lb 6.4 oz (44.6 kg)   LMP 12/27/2007 (Exact Date)   SpO2 99%   BMI 18.82 kg/m  BP Readings from Last 3 Encounters:  08/28/23 104/60  11/07/22 116/60  10/27/22 104/60   Wt Readings from Last 3 Encounters:  08/28/23 98 lb 6.4 oz (44.6 kg)  11/07/22 84 lb 8 oz (38.3 kg)  10/27/22 84 lb (38.1 kg)      Physical Exam Vitals reviewed.  Constitutional:      General: She is not in acute distress.    Appearance: She is well-developed. She is not ill-appearing.  HENT:     Head: Normocephalic and atraumatic.  Eyes:     Pupils: Pupils are equal, round, and reactive to light.  Neck:     Thyroid : No thyromegaly.  Cardiovascular:     Rate and Rhythm: Normal rate and regular rhythm.     Heart sounds: Normal heart sounds. No murmur heard. Pulmonary:     Effort: No respiratory distress.     Breath sounds: Normal  breath sounds. No wheezing or rales.  Abdominal:     General: Bowel sounds are normal. There is no distension.     Palpations: Abdomen is soft. There is no mass.     Tenderness: There is no abdominal tenderness. There is no guarding or rebound.  Musculoskeletal:        General: Normal range of motion.     Cervical back: Normal range of motion and neck supple.     Right lower leg: No edema.     Left lower leg: No edema.  Lymphadenopathy:     Cervical: No cervical adenopathy.  Skin:    Findings: No rash.  Neurological:     Mental Status: She is alert and oriented to person, place, and time.     Cranial Nerves: No cranial nerve deficit.  Psychiatric:        Mood and Affect: Mood normal.        Behavior: Behavior normal.      No results found for any visits on 08/28/23.  Last CBC Lab Results  Component Value Date   WBC 3.3 (L) 04/22/2022   HGB 14.3 04/22/2022   HCT 40.7 04/22/2022   MCV 95.7 04/22/2022   MCH 34.4 (H) 01/26/2020   RDW 13.2 04/22/2022   PLT 245.0 04/22/2022   Last metabolic panel Lab Results  Component Value Date   GLUCOSE 89 10/27/2022   NA 132 (L) 10/27/2022   K 4.4 10/27/2022   CL 95 (L) 10/27/2022   CO2 30 10/27/2022   BUN 14 10/27/2022   CREATININE 0.67 10/27/2022   GFR 94.92 10/27/2022   CALCIUM 10.1 10/27/2022   PROT 8.3 04/22/2022  ALBUMIN 5.3 (H) 04/22/2022   BILITOT 0.3 04/22/2022   ALKPHOS 74 04/22/2022   AST 82 (H) 04/22/2022   ALT 118 (H) 04/22/2022   ANIONGAP 9 05/01/2014   Last lipids Lab Results  Component Value Date   CHOL 262 (H) 04/22/2022   HDL 51.30 04/22/2022   LDLCALC 178 (H) 04/22/2022   TRIG 160.0 (H) 04/22/2022   CHOLHDL 5 04/22/2022   Last hemoglobin A1c No results found for: "HGBA1C" Last thyroid  functions Lab Results  Component Value Date   TSH 2.64 04/22/2022   T4TOTAL 10.4 01/26/2020      The 10-year ASCVD risk score (Arnett DK, et al., 2019) is: 4.1%    Assessment & Plan:   Problem List Items  Addressed This Visit   None Visit Diagnoses       Physical exam    -  Primary   Relevant Orders   Lipid panel   Basic metabolic panel with GFR   CBC with Differential/Platelet   Hepatic function panel     Family history of premature CAD       Relevant Orders   CT CARDIAC SCORING (SELF PAY ONLY)     63 year old female with history of hyperlipidemia and family history of CAD as above.  We discussed setting up coronary calcium score to further risk stratify.  We also discussed the fact that she is overdue for repeat colonoscopy and she will look into setting that up and let us  know she needs referral.  Rechecking labs as above.  -We discussed Prevnar 20 and she declines at this time. - Continue annual flu vaccine - She is getting mammograms every other year - Getting Pap smears through GYN - Continue regular weightbearing exercise.  She is looking at getting DEXA scan soon through GYN.  No follow-ups on file.    Glean Lamy, MD

## 2023-08-28 NOTE — Patient Instructions (Signed)
 Let me know if you would like for me to set up repeat Colonoscopy.

## 2023-09-10 ENCOUNTER — Ambulatory Visit (HOSPITAL_COMMUNITY)
Admission: RE | Admit: 2023-09-10 | Discharge: 2023-09-10 | Disposition: A | Discharge status: Home / Self Care | Payer: Self-pay | Source: Ambulatory Visit | Attending: Family Medicine | Admitting: Family Medicine

## 2023-09-10 DIAGNOSIS — Z8249 Family history of ischemic heart disease and other diseases of the circulatory system: Secondary | ICD-10-CM | POA: Insufficient documentation

## 2023-09-11 ENCOUNTER — Ambulatory Visit: Payer: Self-pay | Admitting: Family Medicine

## 2023-09-15 ENCOUNTER — Ambulatory Visit: Admitting: Family Medicine

## 2023-09-15 ENCOUNTER — Encounter: Payer: Self-pay | Admitting: Family Medicine

## 2023-09-15 VITALS — BP 108/66 | HR 66 | Temp 97.6°F | Wt 98.9 lb

## 2023-09-15 DIAGNOSIS — E785 Hyperlipidemia, unspecified: Secondary | ICD-10-CM

## 2023-09-15 NOTE — Progress Notes (Signed)
 Established Patient Office Visit  Subjective   Patient ID: Heather Davila, female    DOB: 07-21-1960  Age: 63 y.o. MRN: [id number]  Chief Complaint  Patient presents with   Results    HPI   Leona is here to discuss recent coronary calcium score and hyperlipidemia.  She had recent labs with total cholesterol 250 with LDL 183.  Father had CABG age 44.  We sent her for coronary calcium score and she had total score of 90 with 89 LAD.  87 percentile for age and gender.  She is very reluctant to consider any statin or other antilipid therapy.  She does have history of chronic mild elevated liver transaminases which are approximately 2 times normal.  She is aware of PCSK9 inhibitors but declines that as well.  She is interested in assessing LP(a) even though she is aware there are no specific therapies for that currently  She is very health-conscious overall and stays very active physically.  Tries to follow a low saturated fat diet.  Non-smoker.  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   TRANSAMINASES, SERUM, ELEVATED 08/14/2009   Past Surgical History:  Procedure Laterality Date   AUGMENTATION MAMMAPLASTY Bilateral    BREAST ENHANCEMENT SURGERY Bilateral    BUNIONECTOMY     CESAREAN SECTION     X 3   FACELIFT  2022   HYSTEROSCOPY  12/27/2007   and HTA   UPPER GASTROINTESTINAL ENDOSCOPY  2020    reports that she quit smoking about 36 years ago. Her smoking use included cigarettes. She started smoking about 41 years ago. She has a 2.5 pack-year smoking history. She has never used smokeless tobacco. She reports that she does not drink alcohol and does not use drugs. family history includes Cancer (age of onset: 74) in her mother; Colon cancer (age of onset: 64) in her  mother; Colon polyps in her mother; Depression in her sister; Diabetes in her paternal uncle; Heart attack (age of onset: 70) in her sister; Heart disease in her paternal grandfather and sister; Heart disease (age of onset: 45) in her father; Heart failure in her paternal aunt; Hypertension in her father and mother; Stroke (age of onset: 72) in her father. Allergies  Allergen Reactions   Ciprofloxacin Hives   Oxycodone-Acetaminophen  Hives    Review of Systems  Constitutional:  Negative for malaise/fatigue.  Eyes:  Negative for blurred vision.  Respiratory:  Negative for shortness of breath.   Cardiovascular:  Negative for chest pain.  Neurological:  Negative for dizziness, weakness and headaches.      Objective:     BP 108/66 (BP Location: Left Arm, Patient Position: Sitting, Cuff Size: Normal)   Pulse 66   Temp 97.6 F (36.4 C) (Oral)   Wt 98 lb 14.4 oz (44.9 kg)   LMP 12/27/2007 (Exact Date)   SpO2 97%   BMI 18.92 kg/m  BP Readings from Last 3 Encounters:  09/15/23 108/66  08/28/23 104/60  11/07/22 116/60   Wt Readings from Last 3 Encounters:  09/15/23 98 lb 14.4 oz (44.9 kg)  08/28/23 98 lb 6.4 oz (44.6 kg)  11/07/22 84 lb 8 oz (38.3 kg)      Physical Exam  Vitals reviewed.  Constitutional:      Appearance: Normal appearance.  Cardiovascular:     Rate and Rhythm: Normal rate and regular rhythm.  Neurological:     Mental Status: She is alert.      No results found for any visits on 09/15/23.    The 10-year ASCVD risk score (Arnett DK, et al., 2019) is: 4.5%    Assessment & Plan:   Patient has family history of premature CAD as above, hyperlipidemia history, and recent coronary calcium score of 90 which is 9 percentile for age and gender.  She has very high cholesterol with recent total cholesterol 250 and LDL of 183.  She does also have chronic elevated liver transaminases which have been evaluated by GI.  She is very reluctant to consider statin therapy  especially in view of her chronic liver issue.  We did discuss other antilipid therapy such as Zetia or PCSK9 inhibitors.  She declines both at this time.  She does request LP(a).  She is aware there are no drugs available specifically to treat LP(a) although PCSK9 inhibitors currently lower this approximately 20%.  -Continue heart healthy diet with handout given - We did discuss the fact there are at least couple drugs in phase 3 trials looking at lowering LP(a) which may become an option down the road   No follow-ups on file.    Glean Lamy, MD

## 2023-09-18 ENCOUNTER — Ambulatory Visit (HOSPITAL_BASED_OUTPATIENT_CLINIC_OR_DEPARTMENT_OTHER): Payer: Self-pay | Admitting: Obstetrics & Gynecology

## 2023-09-19 LAB — LIPOPROTEIN A (LPA): Lipoprotein (a): 32 nmol/L (ref <75)

## 2023-09-21 ENCOUNTER — Ambulatory Visit: Payer: Self-pay | Admitting: Family Medicine

## 2023-10-07 ENCOUNTER — Encounter: Payer: Self-pay | Admitting: Psychiatry

## 2023-10-07 ENCOUNTER — Ambulatory Visit (INDEPENDENT_AMBULATORY_CARE_PROVIDER_SITE_OTHER): Payer: Self-pay | Admitting: Psychiatry

## 2023-10-07 DIAGNOSIS — G4700 Insomnia, unspecified: Secondary | ICD-10-CM

## 2023-10-07 DIAGNOSIS — F900 Attention-deficit hyperactivity disorder, predominantly inattentive type: Secondary | ICD-10-CM | POA: Diagnosis not present

## 2023-10-07 DIAGNOSIS — F411 Generalized anxiety disorder: Secondary | ICD-10-CM | POA: Diagnosis not present

## 2023-10-07 DIAGNOSIS — F339 Major depressive disorder, recurrent, unspecified: Secondary | ICD-10-CM | POA: Diagnosis not present

## 2023-10-07 DIAGNOSIS — G251 Drug-induced tremor: Secondary | ICD-10-CM

## 2023-10-07 MED ORDER — VENLAFAXINE HCL ER 75 MG PO CP24: 225.0000 mg | ORAL_CAPSULE | Freq: Every day | ORAL | 0 refills | Status: DC

## 2023-10-07 MED ORDER — XELSTRYM 18 MG/9HR TD PTCH: 1.0000 | MEDICATED_PATCH | Freq: Every morning | TRANSDERMAL | 0 refills | Status: AC

## 2023-10-07 MED ORDER — BELSOMRA 20 MG PO TABS: 1.0000 | ORAL_TABLET | Freq: Every evening | ORAL | 1 refills | Status: AC

## 2023-10-07 NOTE — Progress Notes (Signed)
 Heather Davila [id number] 08-28-1960 63 y.o.  Subjective:   Patient ID:  Heather Davila is a 63 y.o. (DOB [birth year]) female.  Chief Complaint:  Chief Complaint  Patient presents with   Follow-up   Depression   Anxiety    HPI Heather Davila presents to the office today for follow-up of treatment resistant recurrent depression, generalized anxiety disorder and insomnia.  Almost too numerous to count med failures. She is here for Spravato  administration for her treatment resistant depression.  05/21/22 appt noted: Patient was administered first dose Spravato  56 mg intranasally today.  The patient experienced the typical dissociation which gradually resolved over the 2-hour period of observation.  There were no complications.  Specifically the patient did not have nausea or vomiting or headache.   Dissociation was mild. Did not experience any emotional relief from disabling depression.wants to increase Spravato  to the usual dose.  She is aware insurance is not covering the costs at this time.  No SI acutely. Tolerating meds.    05/23/22 appt noted: Patient was administered Spravato  84 mg intranasally today.  The patient experienced the typical dissociation which gradually resolved over the 2-hour period of observation.  There were no complications.  Specifically the patient did not have nausea or vomiting.   Dissociation was greater than last dose and a little bothersome DT some HA. Tolerating meds.   Mood has not changed significantly thus far with Spravato .  05/27/22 appt noted: Patient was administered Spravato  84 mg intranasally today.  The patient experienced the typical dissociation which gradually resolved over the 2-hour period of observation.  There were no complications.  Specifically the patient did not have nausea or vomiting or headache. Today she has felt a little relief from the pressing heaviness of depression but a little more anxiety.  During administration of Spravato  she  listens to [place] music and was able to relax a little more with the feeling of dissociation that was mildly bothersome. Husband was also in on the session today and asked some questions about IV ketamine versus nasal spray ketamine.  05/29/22 appt noted: Cancelled today DT hypertension  05/30/22 appt noted: Patient was administered Spravato  84 mg intranasally today.  The patient experienced the typical dissociation which gradually resolved over the 2-hour period of observation.  There were no complications.  Specifically the patient did not have nausea or vomiting or headache. She is seeing some improvement in mood with less continuous depression and less intensity.  Hopefulness is better.   No med complaints or SE  06/05/22 appt noted: Patient was administered Spravato  84 mg intranasally today.  The patient experienced the typical dissociation which gradually resolved over the 2-hour period of observation.  There were no complications.  Specifically the patient did not have nausea or vomiting or headache.  Specifically HA is less of a problem with Spravato . No SE with meds. Depression is improving with less intensity.  Intermittent anxiety easily.  More able to enjoy things.  No new concerns.  06/17/22 appt noted: Patient was administered Spravato  84 mg intranasally today.  The patient experienced the typical dissociation which gradually resolved over the 2-hour period of observation.  There were no complications.  Specifically the patient did not have nausea or vomiting or headache.  Specifically HA is less of a problem with Spravato . No SE with meds. Depression is improved 45% with less intensity.  Intermittent anxiety less easily.  More able to enjoy things.  No new concerns.  More active. Current meds: increased clonazepam  to about  1.5 mg HS DT recent insomnia, lexapro  20, Dayvigo  10 mg HS, concerta  36 mg AM, trazodone  100 mg HS.  06/20/22 appt noted: Current meds: increased clonazepam  to  about 1.5 mg HS DT recent insomnia, lexapro  20, Dayvigo  10 mg HS, concerta  36 mg AM, trazodone  100 mg HS. Patient was administered Spravato  84 mg intranasally today.  The patient experienced the typical dissociation which gradually resolved over the 2-hour period of observation.  There were no complications.  Specifically the patient did not have nausea or vomiting or headache.  Specifically HA is less of a problem with Spravato . No SE with meds. Depression is improved 45% with less intensity.  Intermittent anxiety less easily.  More able to enjoy things.  No new concerns.  More active.  06/23/22 appt noted:  Current meds: increased clonazepam  to about 1.5 mg HS DT recent insomnia, lexapro  20, Dayvigo  10 mg HS, concerta  36 mg AM, trazodone  100 mg HS. Patient was administered Spravato  84 mg intranasally today.  The patient experienced the typical dissociation which gradually resolved over the 2-hour period of observation.  There were no complications.  Specifically the patient did not have nausea or vomiting or headache.  Specifically HA is less of a problem with Spravato . No SE with meds. Intensity of Spravato  dissociation varies.  Last week very intesne and this week more typical.  Depression is continuing to improve with better interest and activity and motivation.  Gradual improvement.  Wants to continue.  06/25/22 appt noted: Current meds: increased clonazepam  to about 1.5 mg HS DT recent insomnia, lexapro  20, Dayvigo  10 mg HS, concerta  36 mg AM, trazodone  100 mg HS. Patient was administered Spravato  84 mg intranasally today.  The patient experienced the typical dissociation which gradually resolved over the 2-hour period of observation.  There were no complications.  Specifically the patient did not have nausea or vomiting or headache.  Specifically HA is less of a problem with Spravato . No SE with meds. Intensity of Spravato  dissociation varies.  No scary and well tolerated. Continues to  gradually improve with Spravato .  She is more motivated and enjoying things more.  H sees progress.  Wants to continue twice weekly until gets maximum response.  Dep is not gone yet.  06/30/22 appt noted:  seen with H Current meds: increased clonazepam  to about 1.5 mg HS DT recent insomnia, lexapro  20, Dayvigo  10 mg HS, concerta  36 mg AM, trazodone  100 mg HS. Patient was administered Spravato  84 mg intranasally today.  The patient experienced the typical dissociation which gradually resolved over the 2-hour period of observation.  There were no complications.  Specifically the patient did not have nausea or vomiting or headache.  Specifically HA is less of a problem with Spravato . No SE with meds. Intensity of Spravato  dissociation varies.  No scary and well tolerated. Continues to gradually improve with Spravato .  She is more motivated and enjoying things more.  H sees even more progress than she does; more active and smiling.  Wants to continue twice weekly until gets maximum response.  Dep is 80% better.  07/07/22 appt noted: Current meds: increased clonazepam  to about 1.5 mg HS DT recent insomnia, lexapro  20, Dayvigo  10 mg HS, concerta  36 mg AM, trazodone  100 mg HS. Patient was administered Spravato  84 mg intranasally today.  The patient experienced the typical dissociation which gradually resolved over the 2-hour period of observation.  There were no complications.  Specifically the patient did not have nausea or vomiting or headache.  Specifically  HA is less of a problem with Spravato . No SE with meds. Intensity of Spravato  dissociation varies.  No scary and well tolerated.  Was more intese today. Overall mood is markedly better and please with meds. No changes desired.  07/09/22 appt noted: In transition from Lexapro  to sertraline  and missing Concerta  bc CO crash from it after about 4 hours.  Disc these concerns.  She'll discuss also with Roselyn Connor, NP More dep the last couple of days and  concerned about reducing Spravato  frequency while transition from one antidep to another.  Affect less positive. Patient was administered Spravato  84 mg intranasally today.  The patient experienced the typical dissociation which gradually resolved over the 2-hour period of observation.  There were no complications.  Specifically the patient did not have nausea or vomiting or headache.  Specifically HA is less of a problem with Spravato . No SE with meds. Intensity of Spravato  dissociation varies.  No scary and well tolerated.   07/14/22 appt noted: Has been more depressed this week.  More trouble with sleep.  Taking clonazepam  1-1.5  of the 0.5 mg tablets.   Trazodone  not helping much. She switched back from 200 sertraline  to Lexapro  20.  No SE More trouble with motivation.  Some stress with son with special needs. Misses the benevit of Concerta  but it was too short acting and crashed in 4-6 hours. Patient was administered Spravato  84 mg intranasally today.  The patient experienced the typical dissociation which gradually resolved over the 2-hour period of observation.  There were no complications.  Specifically the patient did not have nausea or vomiting or headache.  Specifically HA is less of a problem with Spravato . No SE with meds. Intensity of Spravato  dissociation varies.  No scary and well tolerated.   07/17/22 appt noted: Patient was administered Spravato  84 mg intranasally today.  The patient experienced the typical dissociation which gradually resolved over the 2-hour period of observation.  There were no complications.  Specifically the patient did not have nausea or vomiting or headache.  Specifically HA is less of a problem with Spravato . No SE with meds. Intensity of Spravato  dissociation varies.  Not scary and well tolerated.  Just got Jornay last night but wasn't sure how to take it.  Wants to start and this was discussed.  She felt MPH helped mood and attn but didn't last long enough  and had crash after a few hours on Concerta .  Better for 2 hours after each dose Ritalin  20 mg with mood but then it wore off.  BP up after 3rd dose 160/90 and then took H's amlodipine 5 and BP became normal.  07/22/22 appt noted: Patient was administered Spravato  84 mg intranasally today.  The patient experienced the typical dissociation which gradually resolved over the 2-hour period of observation.  There were no complications.  Specifically the patient did not have nausea or vomiting or headache.  Specifically HA is less of a problem with Spravato . No SE with meds. Intensity of Spravato  dissociation varies.  Not scary and well tolerated.  Start Jornay 20 mg over the weekend and did not notice any particular effect good or bad.  Increased to 40 mg. Other psych meds: Clonazepam  0.5 mg tablets 2 nightly, gabapentin  dosage varies, Dayvigo  10 mg nightly, Lexapro  20 mg daily, to trazodone  100 mg nightly  07/24/22 appt noted: Patient was administered Spravato  84 mg intranasally today.  The patient experienced the typical dissociation which gradually resolved over the 2-hour period of observation.  There were no complications.  Specifically the patient did not have nausea or vomiting or headache.  Specifically HA is less of a problem with Spravato . No SE with meds. Intensity of Spravato  dissociation varies.  Not scary and well tolerated.  Other psych meds: Clonazepam  0.5 mg tablets 2 nightly, gabapentin  dosage varies, Dayvigo  10 mg nightly, Lexapro  20 mg daily, to trazodone  100 mg nightly, Jornay 40 mg PM Doing well with Spravato .  Noticed a little effect from the [place] 40 without SE but would like to increase the dose for ADD and mood.  Sleeping well.  No new concerns.  Still more depressed than she was with th initial benefit of Spravato .  07/30/22 note:  Patient was administered Spravato  84 mg intranasally today.  The patient experienced the typical dissociation which gradually resolved over the 2-hour  period of observation.  There were no complications.  Specifically the patient did not have nausea or vomiting or headache.  Specifically HA is less of a problem with Spravato . Other psych meds: Clonazepam  0.5 mg tablets 2 nightly, gabapentin  dosage varies, Dayvigo  10 mg nightly, Lexapro  20 mg daily, to trazodone  100 mg nightly, Jornay 60 mg PM Wants to increase Jornay to 80 mg PM to increase benefit for ADD and depression. No SE She has experienced a major family crisis that threatens to change her daily life from now on.  It has not triggered suicidal thoughts at this time but she is very afraid.  No desire to change medications.  08/04/22 appt noted Patient was administered Spravato  84 mg intranasally today.  The patient experienced the typical dissociation which gradually resolved over the 2-hour period of observation.  There were no complications.  Specifically the patient did not have nausea or vomiting or headache.  Specifically HA is less of a problem with Spravato . Other psych meds: Clonazepam  0.5 mg tablets 2 nightly, gabapentin  dosage varies, Dayvigo  10 mg nightly, Lexapro  20 mg daily, trazodone  100 mg nightly, Jornay 80 mg PM No SE No benefit or SE with increase Jormay 80 mg.  Says Concerta  helped ADD and depression but not Jornay.  However duration was insufficient.  Still depressed and wants change to another form of stimulant.  No concerns with other meds. Continues with strong family stressors creating uncertainty about her future but no quick resolution. No SI Trial Cotempla  17.3 and DC Ritalin  & Jornay 80  08/11/2022 appointment noted: Patient was administered Spravato  84 mg intranasally today.  The patient experienced the typical dissociation which gradually resolved over the 2-hour period of observation.  There were no complications.  Specifically the patient did not have nausea or vomiting or headache.  Specifically HA is less of a problem with Spravato  now vs when started  it.. Other psych meds: Clonazepam  0.5 mg tablets 2 nightly, gabapentin  dosage varies, Dayvigo  10 mg nightly, trazodone  100 mg nightly, cotempla  17.3, switch from Lexapro  20 to sertaline 15o mg . No noticeable effects sertraline  from for depression but has seen some antianxiety effects from the sertraline .  No side effects noted.  No side effects with the other medicines.  Still is only getting very brief benefit 3 to 4 hours from Cotempla  for ADD and mood.  She wants to try the Daytrana  patch as we have discussed before. No SE  08/13/22 appt: Patient was administered Spravato  84 mg intranasally today.  The patient experienced the typical dissociation which gradually resolved over the 2-hour period of observation.  There were no complications.  Specifically the patient did not  have nausea or vomiting or headache.  Specifically HA is less of a problem with Spravato  now vs when started it.. Other psych meds: Clonazepam  0.5 mg tablets 2 nightly, gabapentin  dosage varies, Dayvigo  10 mg nightly, trazodone  100 mg nightly, cotempla  17.3, switch from Lexapro  20 to sertaline 15o mg . No noticeable effects sertraline  from for depression but has seen some antianxiety effects from the sertraline .  No side effects noted.  No side effects with the other medicines.  Still is only getting very brief benefit 3 to 4 hours from Cotempla  for ADD and mood.  She wants to try the Daytrana  patch as we have discussed before.  Hasn't been able to get it yet. No SE Plan.  Continue to try to get Daytrana  30 AM  08/18/22 appt noted: Patient was administered Spravato  84 mg intranasally today.  The patient experienced the typical dissociation which gradually resolved over the 2-hour period of observation.  There were no complications.  Specifically the patient did not have nausea or vomiting or headache.  Specifically HA is less of a problem with Spravato  now vs when started it.. Other psych meds: Clonazepam  0.5 mg tablets 2 nightly,  gabapentin  dosage varies, Dayvigo  10 mg nightly, trazodone  100 mg nightly, cotempla  17.3, switch from Lexapro  20 to sertaline 15o mg . No noticeable effects sertraline  from for depression but has seen some antianxiety effects from the sertraline .  No side effects noted.  No side effects with the other medicines.  Still is only getting very brief benefit 3 to 4 hours from Cotempla  for ADD and mood.  She wants to try the Daytrana  patch as we have discussed before.  Hasn't been able to get it yet.  Overall depression is some better than it was.   No SE Plan.  Continue to try to get Daytrana  30 AM  08/25/22 appt : No benefit Daytrana .  No SE.  Wants further med changes and interested In retrying pramipexole .   Tolerating meds. Patient was administered Spravato  84 mg intranasally today.  The patient experienced the typical dissociation which gradually resolved over the 2-hour period of observation.  There were no complications.  Specifically the patient did not have nausea or vomiting or headache.  Specifically HA is less of a problem with Spravato  now vs when started it.. Other psych meds: Clonazepam  0.5 mg tablets 2 nightly, gabapentin  dosage varies, Dayvigo  10 mg nightly, trazodone  100 mg nightly, , switch from Lexapro  20 to sertaline 15o mg . Daytrana  30 AM for a few days. Struggles with depression and no SI.  Reduced interest and mood and motivation and enjoyment and socialization.  08/27/22 appt noted: Psych meds:  Clonazepam  0.5 mg tablets 2 nightly, gabapentin  dosage varies, Dayvigo  10 mg nightly, trazodone  100 mg nightly,  sertaline 15o mg . Returned to Cotempla  AM, pramipexole   Tolerating meds. Patient was administered Spravato  84 mg intranasally today.  The patient experienced the typical dissociation which gradually resolved over the 2-hour period of observation.  There were no complications.  Specifically the patient did not have nausea or vomiting or headache.   Reports taking cotempla  bc brief  mood benefit and sig focus and productivity benefit. Took 1 dose pramipexole  and felt more dep.  09/01/22 appt noted: Psych meds:  Clonazepam  0.5 mg tablets 2 nightly, gabapentin  dosage varies, Dayvigo  10 mg nightly, trazodone  100 mg nightly,  sertaline 15o mg . Returned to Cotempla  AM, pramipexole   0.25 mg BID. Tolerating meds now. Patient was administered Spravato  84 mg intranasally today.  The patient experienced the typical dissociation which gradually resolved over the 2-hour period of observation.  There were no complications.  Specifically the patient did not have nausea or vomiting or headache.   Willing to try the pramipexole  and will continue 0.25 mg BID this week and then consider increase if tolerated.  Mood is a little better.  Still looking for further improvement.  09/03/22 appt noted: Psych meds:  Clonazepam  0.5 mg tablets 2 nightly, gabapentin  dosage varies, Dayvigo  10 mg nightly, trazodone  100 mg nightly,  sertaline 15o mg . Returned to Cotempla  AM, pramipexole   0.25 mg tablet 1 and 1/2 tablets twice daily BID. Tolerating meds now. Patient was administered Spravato  84 mg intranasally today.  The patient experienced the typical dissociation which gradually resolved over the 2-hour period of observation.  There were no complications.  Specifically the patient did not have nausea or vomiting or headache.   Depression may be a little better with the parmipexole and is tolerating it well so far.  Still struggling with focus and residual depression.  Tolerating meds without SE.  More hopeful and able to enjoy some things.  09/08/22 appt  Psych meds:  Clonazepam  0.5 mg tablets 2 nightly, gabapentin  dosage varies, Dayvigo  10 mg nightly, trazodone  100 mg nightly,  sertaline 15o mg . Returned to Cotempla  AM, pramipexole   0.5 mg BID. Tolerating meds now. Patient was administered Spravato  84 mg intranasally today.  The patient experienced the typical dissociation which gradually resolved over the  2-hour period of observation.  There were no complications.  Specifically the patient did not have nausea or vomiting or headache.   She has seen her depression drop from a 10/10 prior to treatment with Spravato  to now 4/10 With additional benefit noted when she increased the pramipexole  to 0.5 mg twice daily.  She is not having side effects with this like she did in the past. Anxiety levels are also improved. She is sleeping okay with the current medications. Plan: continue pramipexole  trial off-label and increase more slowly for TRD.  Increase  Gradually  to 0.75 mg BID  09/11/22 appt noted: Psych meds:  Clonazepam  0.5 mg tablets 2 nightly, gabapentin  dosage varies, Dayvigo  10 mg nightly, trazodone  100 mg nightly,  sertaline 15o mg . Returned to Cotempla  AM, pramipexole   0.75 mg BID. Tolerating meds now. Patient was administered Spravato  84 mg intranasally today.  The patient experienced the typical dissociation which gradually resolved over the 2-hour period of observation.  There were no complications.  Specifically the patient did not have nausea or vomiting or headache.   She has seen her depression drop from a 10/10 prior to treatment with Spravato  to now 4/10 With additional benefit noted when she increased the pramipexole  to 0.5 mg twice daily.  She is not having side effects with this like she did in the past. Clearly improving with the pramipexole  now and tolerating it.  Satisfied with current meds but would consider increasing if it might be helpful.  09/15/22 appt noted: Psych meds:  Clonazepam  0.5 mg tablets 2 nightly, gabapentin  dosage varies, Dayvigo  10 mg nightly, trazodone  100 mg nightly,  sertaline 15o mg . Returned to Cotempla  AM, pramipexole   0.75 mg BID too anxious and reduced to 0.5 mg BID. Tolerating meds now. Patient was administered Spravato  84 mg intranasally today.  The patient experienced the typical dissociation which gradually resolved over the 2-hour period of  observation.  There were no complications.  Specifically the patient did not have nausea or vomiting  or headache.   Trouble staying asleep longer than 6 hours.  Doesn't feel she can tolerate pramipexole  0.75 mg BID DT anxiety not experienced at  0.5 mg BID.  Otherwise tolerating meds.  Mood is better with Spravato  and pramipexole .   Not sleeping as long with meds.  EMA 6 hours.  Wonders about change Disc concerns about waiting for therapy. Feels ready to reduce Spravato  to weekly.    09/25/22 appt noted: Psych meds:  Clonazepam  0.5 mg tablets 2 nightly, gabapentin  dosage varies, Dayvigo  10 mg nightly, trazodone  100 mg nightly,  sertaline 15o mg . Returned to Cotempla  AM, pramipexole   0.75 mg BID too anxious and reduced to 0.5 mg BID. Tolerating meds now. Patient was administered Spravato  84 mg intranasally today.  The patient experienced the typical dissociation which gradually resolved over the 2-hour period of observation.  There were no complications.  Specifically the patient did not have nausea or vomiting or headache.   Trouble staying asleep longer than 6 hours.  Failed to respond to Seroquel  25 mg HS.  Gone back to Dayvigo .  Belsomra  NR. Mood ok until the last few days more dep bc it's been 10 days since last admin.  Wants to continue weekly.   No SE.  No med changes desired.  09/29/22 appt noted: Psych meds:  Clonazepam  0.5 mg tablets 2 nightly, gabapentin  dosage varies, Dayvigo  10 mg nightly, trazodone  100 mg nightly,  sertaline 15o mg . Returned to Cotempla  AM, pramipexole   0.75 mg BID too anxious and reduced to 0.5 mg BID. Tolerating meds now. Patient was administered Spravato  84 mg intranasally today.  The patient experienced the typical dissociation which gradually resolved over the 2-hour period of observation.  There were no complications.  Specifically the patient did not have nausea or vomiting or headache.   Trouble staying asleep longer than 6 hours.  Plan: Continue sertaline to  200 mg daily.  It has helped anxiety but not depression.   for insomnia, clonazepam  1 mg HS. trazodone  doesn't work well but helps some Continue Dayvigo  10 HS.  Failed resp Belsomra  15 and seroquel  25 She wants to continue Cotempla  or Concerta  bc helps mood early in day and focus  benefit lasts longer.  Have not been able to get stimulant to be consitently effective throughout the day. continue pramipexole  trial off-label and reduce back to  TRD 0.5 mg BID Spravato  weekly.  10/13/22 appt noted: Not sleeping as well.  Wants to increase Spravato  to twice weekly bc feeling more dep close to the next sched date.  Just the day or 2 before.  .  Plan: Trial for off label TRD increase pramipexole  1 mg TID  10/20/22 appt noted: Psych meds:  Clonazepam  0.5 mg tablets 2 nightly, gabapentin  dosage varies, Dayvigo  10 mg nightly, trazodone  100 mg nightly,  sertaline 15o mg . Returned to Cotempla  AM, pramipexole   1 mg TID. Tolerating meds now. Patient was administered Spravato  84 mg intranasally today.  The patient experienced the typical dissociation which gradually resolved over the 2-hour period of observation.  There were no complications.  Specifically the patient did not have nausea or vomiting or headache.   Trouble staying asleep longer than 6 hours.  More dep this week and asked to get Spravato  twice this week. Plan: Trial for off label TRD increase pramipexole  1 mg QID  10/22/22 appt noted:  Psych meds:  Clonazepam  0.5 mg tablets 2 nightly and another one with EMA, gabapentin  dosage varies, Belsomra  20 mg daily,  trazodone  100 mg nightly,  sertaline 15o mg . Returned to Cotempla  AM, pramipexole   1 mg QID for 2nd day Tolerating meds now.  Except a little N and dizzy briefly after takes it. Patient was administered Spravato  84 mg intranasally today.  The patient experienced the typical dissociation which gradually resolved over the 2-hour period of observation.  There were no complications.  Specifically  the patient did not have nausea or vomiting or headache. No change in mood yet.   Sleep better with Belsomra  20 as long as takes with other meds.  If insomnia mood is worse. Ongoing some dep but gets partial relief with Spravato  but would like better relief .  10/27/22 received Spravato  84  11/03/22 appt noted:  Meds as above except sertraline  she cut to 100 mg instead of 200 mg daily She doesn't think sertraline  helping and worries over poss SE Off and on Cotempla  .  Not affecting BP. Doesn't notice benefit or SE with pramipexole  1 mg QID.  No SE Sleep is OK.  Some awakening aftter 6 hours but not enough so takes another clonazepam .   Patient was administered Spravato  84 mg intranasally today.  The patient experienced the typical dissociation which gradually resolved over the 2-hour period of observation.  There were no complications.  Specifically the patient did not have nausea or vomiting or headache. No change in mood yet.   Sleep better with Belsomra  20 as long as takes with other meds.  If insomnia mood is worse. Ongoing some dep but gets partial relief with Spravato  but would like better relief .  No sig benefit with pramipexole  1 mg QID.  11/21/22 TC:  To ER with dep 11/21/22. No SE with selegiline  but feels more dep.  Wants to stop it. Today is only day 2 of 1 tablet of selegiline  5 mg AM.  Reviewed with her that she had not been satisfied with the response of Spravato  and Zoloft  and that she is likely feeling worse because the Zoloft  is wearing off and in particular because she abruptly stopped 150 mg daily.  She needs to give the selegiline  time to work.  She agrees to do so.  She admitted to some passive suicidal thoughts but no active suicidal thoughts and no desire to die.  She agrees to the plan to increase selegiline  to 10 mg every morning this weekend and to continue the selegiline  trial.  She cannot stop this and immediately start another AD due to risk DDI with serotonin syndrome.   She is aware.  Nori Beat, MD, DFAPA  12/19/22 TC: Pt called at 1:42p.  She said she wanted to know if she could start weaning off the Selegiline .  She thinks increasing the dosage is making things worse.  She said she wants to go back to Spravato .  She acknowleged she has an appt on Monday. I advised her that I wasn't sure Dr Toi Foster will see her message before her appt, but she still ask me to send the message.  Next appt 8/26 MD resp.  No change until Monday appt.  12/22/22 appt noted: Meds: selegiline  up to 5 mg 4 daily for a week but felt it incr irritability and went back to 3 mg daily. She wants to go back to Spravato .  Not near as dep as now.  Selegiline  didn't help energy. Still on Belsomra  Thinks maybe sertraline  helped mood more than she thought. More dep than anxiety.   Plan: DC selegiline  and will retry Effexor  which  helped in the past at 300 mg daily.  Start this Friday after over 10 half lives of selegiline .    12/24/22 appt noted: Stopped selegiline   Psych med: belsomra  20, clonazepam  1 mg HS.   She has felt more dep off the Spravato  though at the time didn't think it was helping.  Has felt more down and withdrawn.  Low energy, motivation, not smiling.  No SI.  Sleep ok.   Patient was administered Spravato  84 mg intranasally today.  The patient experienced the typical dissociation which gradually resolved over the 2-hour period of observation.  There were no complications.  Specifically the patient did not have nausea or vomiting or headache. No problems with BP or other unusual SE Mood is a little better after this administration of Spravato .  Very motivated to continue it.  No immediate med concerns. No SI .  Ongoing anhedonia.  12/25/22 appt noted: Psych med: belsomra  20, clonazepam  1 mg HS.  Not resumed stimulant or AD yet   No SE. Patient was administered Spravato  84 mg intranasally today.  The patient experienced the typical dissociation which gradually resolved over the  2-hour period of observation.  There were no complications.  Specifically the patient did not have nausea or vomiting or headache.  There is a lot things going on in her life and it is somewhat difficult to separate stressors from what is going on with her mood.  However she is happy to resume Spravato  as she decided after stopping briefly that it was helpful.  She plans to start the antidepressant Morrow.  She also wants to resume the stimulant tomorrow.  12/30/22 appt noted; Psych med: belsomra  20, clonazepam  1 mg HS.  Not resumed stimulant.  Effexor  XR 37.5 mg daily   No SE. Patient was administered Spravato  84 mg intranasally today.  The patient experienced the typical dissociation which gradually resolved over the 2-hour period of observation.  There were no complications.  Specifically the patient did not have nausea or vomiting or headache.  She is still having depression with very prominent anhedonia.  However she feels the Spravato  is somewhat helpful and is hopeful for more complete response as she gets back onto a reasonable antidepressant dose.  She tolerated Effexor  XR 300 mg daily in the past and took it for 5 months and it is probably the antidepressants worked best for her.  She is agreeable with this plan.  She is getting sleep benefit from the current medications Plan: incr Effexor  75 mg daily  01/02/23 appt noted: Psych meds as noted above except increase Effexor  XR to 75 mg daily without side effects. Patient was administered Spravato  84 mg intranasally today.  The patient experienced the typical dissociation which gradually resolved over the 2-hour period of observation.  There were no complications.  Specifically the patient did not have nausea or vomiting or headache.  Agrees to increasing Effexor  as planned.  SX not changed from that noted above.  No SI  01/05/23 appt noted: Psych meds as noted above except increase Effexor  XR to 112.5 mg daily without side effects. Patient was  administered Spravato  84 mg intranasally today.  The patient experienced the typical dissociation which gradually resolved over the 2-hour period of observation.  There were no complications.  Specifically the patient did not have nausea or vomiting or headache.  Agrees to increasing Effexor  as planned gradually to 300 mg daily.  SX not changed from that noted above.  Does however feel Spravato  has dampened the  degree of depression.  No SI Stressful life event discussed and won't be resolved until early next year.  01/07/23 appt noted: Psych meds Effexor  XR 112.5 mg daily. Patient was administered Spravato  84 mg intranasally today.  The patient experienced the typical dissociation which gradually resolved over the 2-hour period of observation.  There were no complications.  Specifically the patient did not have nausea or vomiting or headache.  Agrees to increasing Effexor  as planned gradually to 300 mg daily.  SX not changed from that noted above.  Does however feel Spravato  has dampened the degree of depression markedly compared to when off psych meds and no Spravato .  No SI  01/12/23 appt noted; Psych meds Effexor  XR 225 mg daily. No SE notedl. Patient was administered Spravato  84 mg intranasally today.  The patient experienced the typical dissociation which gradually resolved over the 2-hour period of observation.  There were no complications.  Specifically the patient did not have nausea or vomiting.  But is having headache.  Agrees to increasing Effexor  as planned gradually to 300 mg daily.  SX not changed from that noted above.  Does however feel Spravato  has dampened the degree of depression markedly compared to when off psych meds and no Spravato .  No SI  01/15/23 appt noted:  Psych meds Effexor  XR 225 mg daily. No SE noted. Patient was administered Spravato  84 mg intranasally today.  The patient experienced the typical dissociation which gradually resolved over the 2-hour period of  observation.  There were no complications.  Specifically the patient did not have nausea or vomiting.   Doing ok with higher doses of Effexor  and will plan to increase to 300 mg daily. Mild improvement noticed so far with the incrase in meds.   Uses Cotempla  prn and it helps but doesn't liek the way she feels when it wears off.  01/22/23 appt noted: Psych meds reduced Effexor  XR 150 mg daily. Bc of HA No SE noted. Patient was administered Spravato  84 mg intranasally today.  The patient experienced the typical dissociation which gradually resolved over the 2-hour period of observation.  There were no complications.  Specifically the patient did not have nausea or vomiting.  Ongoing moderate levels of depression with some improvement from the Spravato .  She is not sure about the effect of the venlafaxine  on her mood yet.  She would like to continue to try pursuing a stimulant but has had difficulty tolerating p.o. versions of stimulants because of the crash at the end and side effects with many of them.  She has several questions about this.  01/27/23 appt noted: Psych meds: Clonazepam  1 to 1.5 mg nightly, lithium  CR 300 nightly, Belsomra  20 nightly, venlafaxine  XR 150 every morning No side effects noted at the current dosage. Patient was administered Spravato  84 mg intranasally today.  The patient experienced the typical dissociation which gradually resolved over the 2-hour period of observation.  There were no complications.  Specifically the patient did not have nausea or vomiting.  Ongoing moderate levels of depression with some improvement from the Spravato .   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  She does not tolerate oral stimulants very well because she has a crash as they wear off which is very dysphoric.  She is willing to retry the Daytrana  patches that she took in the spring.  She took 1 patch on 1 occasion recently and  felt it was helpful. However she is  also having problems with early morning awakening over the last week without precipitant.  She would like to have a different treatment strategy for sleep but understands her sleep problems have historically been difficult to resolve and maintain.  02/10/23 appt noted: Psych meds: Clonazepam  1 to 1.5 mg nightly, lithium  CR 300 nightly, Belsomra  20 nightly, venlafaxine  XR 150 every morning, Daytrana  30 mg Am No side effects noted at the current dosage. Patient was administered Spravato  84 mg intranasally today.  The patient experienced the typical dissociation which gradually resolved over the 2-hour period of observation.  There were no complications.  Specifically the patient did not have nausea or vomiting.  Ongoing moderate levels of depression with some improvement from the Spravato .  Particularly anhedonia.  No sig benefit noted with Daytrana  and problems with table stimulants bc gets brief benefit with unpleasant crash.  Wants to try another type of patch if available.  02/17/23 appt noted: Psych meds: Clonazepam  1 to 1.5 mg nightly, lithium  CR 300 nightly, Belsomra  20 nightly, venlafaxine  XR 225 mg every morning, Daytrana  30 mg Am No side effects noted at the current dosage. Patient was administered Spravato  84 mg intranasally today.  The patient experienced the typical dissociation which gradually resolved over the 2-hour period of observation.  There were no complications.  Specifically the patient did not have nausea or vomiting.  Ongoing moderate levels of depression with some improvement from the Spravato .  Particularly anhedonia.  She has not gotten the new stimulant yet but is hopeful. No problems with increasing venlafaxine .   Mood is better with Spravato  and venlafaxine .  Less anhedonia, less down. Themazepam not helpful.    03/04/23 received Spravato  84 03/12/23 received Spravato  84  03/19/23 appt noted: Psych meds: Clonazepam  1 to 1.5 mg  nightly, lithium  CR 300 nightly, Belsomra  20 nightly, venlafaxine  XR 300 mg every morning, tried various stimulants lately No side effects noted at the current dosage. Patient was administered Spravato  84 mg intranasally today.  The patient experienced the typical dissociation which gradually resolved over the 2-hour period of observation.  There were no complications.  Specifically the patient did not have nausea or vomiting.  Not getting good duration from oral stimulants.  She would like to try another patch but has to take tabs first and try them before insurance will cover patches.  Overall dep is better with meds and Spravato .  Clearly sees benefit.  03/30/23 appt noted: Psych meds: Clonazepam  1 to 1.5 mg nightly, lithium  CR 300 nightly,  venlafaxine  XR 300 mg every morning, tried various stimulants lately, gabapentin  900 mg HS, dexmethylphenidate  ER without help No side effects noted at the current dosage. Patient was administered Spravato  84 mg intranasally today.  The patient experienced the typical dissociation which gradually resolved over the 2-hour period of observation.  There were no complications.  Specifically the patient did not have nausea or vomiting.  Able to leave at end of 2 hour period without assistance.  Clear mood benefit with Spravato  with much less dep and wants to continue. Still trouble sleeping. TR insomnia.  Ongoing conc problems with minimal brief benefit from stimulants.  Not able to get patch Xelstrem yet DT insurance.  04/14/23 appt noted: Psych meds: Clonazepam  1 to 1.5 mg nightly, lithium  CR 300 nightly,  she reduced on her own venlafaxine  XR to 150 mg every morning, tried various stimulants lately, gabapentin  900 mg HS, dexmethylphenidate  ER without help No side effects noted at the current dosage. Patient was administered Spravato  84  mg intranasally today.  The patient experienced the typical dissociation which gradually resolved over the 2-hour period of  observation.  There were no complications.  Specifically the patient did not have nausea or vomiting.  Able to leave at end of 2 hour period without assistance.  Clear mood benefit with Spravato  with much less dep and wants to continue. she went to [facility] and had her brain scanned at [facility]. they recommend she taper off Effexor  which she is only taking 150 mg currently and start Auvelity . She reduce Effexor  on her own to 150 mg daily. She wants further improvement in anhedonia.    04/28/23 received Spravato  84  05/11/23 appt noted: Psych meds: Clonazepam  1 to 1.5 mg nightly, lithium  CR 300 nightly,  venlafaxine  XR to 150 mg every morning, Auvelity  BID, tried various stimulants lately, gabapentin  900 mg HS, dexmethylphenidate  ER without help No side effects noted at the current dosage. Patient was administered Spravato  84 mg intranasally today.  The patient experienced the typical dissociation which gradually resolved over the 2-hour period of observation.  There were no complications.  Specifically the patient did not have nausea or vomiting.  Able to leave at end of 2 hour period without assistance.  Clear mood benefit with Spravato  with much less dep and wants to continue. Overall her mood is still significantly better with the combination of Spravato  venlafaxine  and Auvelity  in particular.  She has not had any problems from the reduction in venlafaxine  but is not dramatically improved with regard to residual depression since adding Auvelity  but she is not having side effects.  We agreed that a longer trial of the helpful.  She is also wondering about increasing the Spravato  frequency from once every other week to once every 10 days in order to prove improved residual depression and specifically anhedonia.  Overall though she is dramatically better with the current combination of meds. Plan: continue venlafaxine  ER 150 with Auvelity  BID bc TRD  05/25/23 appt noted: Psych meds: Clonazepam  1 to  1.5 mg nightly, lithium  CR 300 nightly,  venlafaxine  XR to 150 mg every morning, Auvelity  BID, tried various stimulants lately, gabapentin  900 mg HS, dexmethylphenidate  ER without help No side effects noted at the current dosage. Patient was administered Spravato  84 mg intranasally today.  The patient experienced the typical dissociation which gradually resolved over the 2-hour period of observation.  There were no complications.  Specifically the patient did not have nausea or vomiting.  Able to leave at end of 2 hour period without assistance.  Clear mood benefit with Spravato  with much less dep and wants to continue.  06/04/23 appt noted: Psych meds: Clonazepam  1 to 1.5 mg nightly, lithium  CR 300 nightly,  venlafaxine  XR to 150 mg every morning, Auvelity  1 in the AM, tried various stimulants lately, gabapentin  900 mg HS, no current stimulant (Vyvanse  40 NR) She reduced Auvelity  to 1 daily DT tremor she thinks it made worse and bc no benefit noted with it. She is interested in increasing the Effexor  at 300 mg daily bc did get some benefit from it in the past and tolerated that dos. Chronic dep is better with Spravato  but not gone.  Some anhedonia.  Focus is still not good. Problems getting patch ADD med. Patient was administered Spravato  84 mg intranasally today.  The patient experienced the typical dissociation which gradually resolved over the 2-hour period of observation.  There were no complications.  Specifically the patient did not have nausea or vomiting.  Able  to leave at end of 2 hour period without assistance.  Clear mood benefit with Spravato  with much less dep and wants to continue.  06/16/23 appt noted: Psych meds: Clonazepam  1 to 1.5 mg nightly, lithium  CR 300 nightly,  venlafaxine  XR didn't yet increase so  225 mg every morning, , tried various stimulants lately, gabapentin  900 mg HS, no current stimulant (Vyvanse  40 NR), trazodone  50 HS Chronic dep is better with Spravato  but not gone.   Some anhedonia.  Focus is still not good. Problems getting patch ADD med. Patient was administered Spravato  84 mg intranasally today.  The patient experienced the typical dissociation which gradually resolved over the 2-hour period of observation.  There were no complications.  Specifically the patient did not have nausea or vomiting.  Able to leave at end of 2 hour period without assistance.  Clear mood benefit with Spravato  with much less dep and wants to continue. She has not increased venlafaxine  to 300 mg yet but is eager to do so.  She still has some sleep issues and wonders if trazodone  is working.  She has no side effect with current medications.  She would like to try an alternative med for ADD since the stimulants have not been effective. Plan: increase venlafaxine  XR to 300 mg every morning  06/29/23 appt noted: Med: Clonazepam  1 to 1.5 mg nightly, lithium  CR 300 nightly,  venlafaxine  XR 300 mg every morning, , tried various stimulants lately, gabapentin  900 mg HS, no current stimulant (Vyvanse  40 NR), trazodone  50 HS No SE Patient was administered Spravato  84 mg intranasally today.  The patient experienced the typical dissociation which gradually resolved over the 2-hour period of observation.  There were no complications.  Specifically the patient did not have nausea or vomiting.  Able to leave at end of 2 hour period without assistance.  Clear mood benefit with Spravato  with much less dep and wants to continue.  07/10/23 appt noted: Med: Clonazepam  1 to 1.5 mg nightly, lithium  CR 300 nightly,  venlafaxine  XR 300 mg every morning, , tried various stimulants lately, gabapentin  900 mg HS, no current stimulant , trazodone  50 HS No SE Patient was administered Spravato  84 mg intranasally today.  The patient experienced the typical dissociation which gradually resolved over the 2-hour period of observation.  There were no complications.  Specifically the patient did not have nausea or vomiting.   Able to leave at end of 2 hour period without assistance.  Partial response with Spravato  is sig but not complete.  Disc questions about stimulant vs Strattera  to help more with dep and ADD and productivity. Plan: bc can't get stimulant right now will try Strattera   07/22/23 appt noted: Med: Clonazepam  1 to 1.5 mg nightly, lithium  CR 300 nightly,  venlafaxine  XR 300 mg every morning,  gabapentin  900 mg HS, no current stimulant & never took Strattera , trazodone  50 HS No SE Patient was administered Spravato  84 mg intranasally today.  The patient experienced the typical dissociation which gradually resolved over the 2-hour period of observation.  There were no complications.  Specifically the patient did not have nausea or vomiting.  Able to leave at end of 2 hour period without assistance.  Mood still 50% or so better with spravato  and current meds. Still wants to try stimulant and Xelstrym  just approved finally. Wants to stop some meds bc doesn't like taking a lot of meds.  Most immediate concern is intermittent tremor. Plan: To max opportunity to respond for tRD, lithium  300  mg PM but she wants to stop it DT intermittent tremor.  Disc risk relapse.  I rec she continue bc of this but she wants to stop anyway.    08/04/23 appt noted: Med: Clonazepam  1 to 1.5 mg nightly,  venlafaxine  XR 225 mg every morning,  gabapentin  900 mg HS, trazodone  50 HS, Xelstrym  No SE Patient was administered Spravato  84 mg intranasally today.  The patient experienced the typical dissociation which gradually resolved over the 2-hour period of observation.  There were no complications.  Specifically the patient did not have nausea or vomiting.  Able to leave at end of 2 hour period without assistance.  Mood still 50% or so better with spravato  and current meds. continue Xelstrym  just approved finally. Mood is better with meds and doesn't want to change.   She mentions insurance will end in June and needs to change.  10/07/23  appt noted:  Med : reduced Effexor  to 300/150 every other day on her own DT tremor, no gabapentin  nor trazodone  bc not helpful for sleep, clonazepam  1-1.5 mg HS;  she experimented with Xelstrym  and increased dose on her own to 1 and 1/2  patch daily . Doing much better with mood.  Tremor better not gone.   H on Abilify  an dhelped him with Abilify  and wants to wean Effexor  and start Abilify .  Sleeping bills work for [employer] and then don't. Last night only slept 4 hours and avg 5-6 hours.  Thinks 7 is normal.  Now trouble goig to sleep.  Focused on sleep and worries about it.  So trouble going to sleep.  AIMS    Flowsheet Row Video Visit from 01/03/2022 in [facility]  AIMS Total Score 0      ECT-MADRS    Flowsheet Row Clinical Support from 08/13/2022 in [facility] Video Visit from 04/29/2022 in [facility] Video Visit from 02/06/2022 in [facility] [facility]  MADRS Total Score 27 44 23      GAD-7    Flowsheet Row Counselor from 12/19/2022 in [facility] Office Visit from 08/22/2022 in [facility] [place]  Total GAD-7 Score 4 7      PHQ2-9    Flowsheet Row Office Visit from 09/15/2023 in [facility] [place] Counselor from 12/19/2022 in [facility] Office Visit from 08/22/2022 in [facility] [place] Office Visit from 08/19/2022 in [facility] at [facility] Video Visit from 02/06/2022 in [facility]  PHQ-2 Total Score 2 2 3  0 6  PHQ-9 Total Score 4 5 5  -- 14        Past Psychiatric Medication Trials: Trintellix - Initially effective and then was not effective when re-started Paxil - Ineffective. Helped initially. Prozac-Insomnia Lexapro - Effective and then no longer as effective Zoloft -  Initially effective and then minimal response Viibryd  Cymbalta- Ineffective. Helped initially. Effexor  XR 300- Effective, well tolerated. Pristiq - Increased anxiety at 150 mg daily Wellbutrin  XL-Ineffective.May have increased anxiety. Amitriptyline Nortriptyline -Caused irritability, constipation Auvelity - ineffective 2 trials Selegilne brief 20 mg daily , more irritable  Abilify - Helpful but caused severe insomnia. Rexulti -Helpful but caused insomnia. Vraylar  Seroquel  - Ineffective Risperdal Olanzapine- Ineffective Latuda - Ineffective Caplyta   Klonopin - Effective Temazepam - Took during menopause Lunesta- Ineffective Ambien-Ineffective Sonata- Ineffective Dayvigo - partially effective  Belsomra  20 worked and then failed  Trazodone -  somewhat effective Remeron-Ineffective. Does not recall  wt gain.  Gabapentin  lost benefit for sleep Quetiapine  25 NR  Adderall- Took once and had adverse effect. Concerta - Effective but only 4-6 hours Jornay 80 NR Metadate -Not as effective as Concerta  Dexmethylphenidate  IR NR Dexmethylphenidate  ER 10/02/21- Not as effective as Concerta  Azstarys - some side effects. Not as effective.  Ritalin  short acting and SE anxiety & HTN @20  TID Cotempla  17.3  Daytrana  NR Vyvanse  40 NR  Lamictal- May have had an adverse effects. Felt weird. Reports worsening s/s.  Lithium  ? effect Gabapentin  Pramipexole - 1 mg QID NR  ECT #20 with minimal response. H says it was partly helpful.  AIMS    Flowsheet Row Video Visit from 01/03/2022 in [facility]  AIMS Total Score 0      ECT-MADRS    Flowsheet Row Clinical Support from 08/13/2022 in [facility] Video Visit from 04/29/2022 in [facility] Video Visit from 02/06/2022 in [facility]  MADRS Total Score 27 44 23      GAD-7    Flowsheet Row Counselor from 12/19/2022 in [facility] Office Visit from 08/22/2022 in [facility] [place]  Total GAD-7 Score 4 7      PHQ2-9    Flowsheet Row Office Visit from 09/15/2023 in [facility] [place] Counselor from 12/19/2022 in [facility] Office Visit from 08/22/2022 in [facility] [place] Office Visit from 08/19/2022 in [facility] Video Visit from 02/06/2022 in [facility]  PHQ-2 Total Score 2 2 3  0 6  PHQ-9 Total Score 4 5 5  -- 14        Review of Systems:  Review of Systems  Constitutional:  Positive for fatigue.  HENT:  Negative for dental problem.   Cardiovascular:  Negative for palpitations.  Neurological:  Positive for tremors. Negative for dizziness and headaches.  Psychiatric/Behavioral:  Positive for dysphoric mood and sleep disturbance. Negative for agitation, behavioral problems, confusion, decreased concentration, hallucinations and suicidal ideas. The patient is nervous/anxious.     Medications: I have reviewed the patient's current medications.  Current Outpatient Medications  Medication Sig Dispense Refill   clonazePAM  (KLONOPIN ) 1 MG tablet Take 1-2 tablets (1-2 mg total) by mouth at bedtime. 60 tablet 1   Dextroamphetamine  (XELSTRYM ) 18 MG/9HR PTCH Place 1 patch onto the skin every morning. 30 patch 0   Suvorexant  (BELSOMRA ) 20 MG TABS Take 1 tablet (20 mg total) by mouth at bedtime. 30 tablet 1   valACYclovir  (VALTREX ) 500 MG tablet Take one twice daily for 3 days with any symptoms 30 tablet 3   valsartan  (DIOVAN ) 80 MG tablet Take 1 tablet (80 mg total) by mouth daily. 90 tablet 3   venlafaxine  XR (EFFEXOR -XR) 75 MG 24 hr capsule Take 3 capsules (225 mg total) by mouth daily with breakfast. 270 capsule 0   No current facility-administered medications for this visit.    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   TRANSAMINASES, SERUM, ELEVATED 08/14/2009    Past Medical History, Surgical history, Social history, and Family history were reviewed and updated as appropriate.   Please see review of systems for further details on the patient's review from today.   Objective:   Physical Exam:  LMP 12/27/2007 (Exact Date)   Physical Exam Constitutional:      General: She is not in acute distress. Musculoskeletal:        General: No deformity.  Neurological:     Mental Status: She is alert and oriented to person, place, and time.     Motor: Tremor present.     Coordination: Coordination normal.     Comments: Minimal tremor  Psychiatric:        Attention and Perception: Perception normal. She is inattentive. She does not perceive auditory hallucinations.        Mood and Affect: Mood is anxious and depressed. Affect is blunt. Affect is not tearful.        Speech: Speech normal.        Behavior: Behavior normal.        Thought Content: Thought content normal. Thought content is not delusional. Thought content does not include homicidal or suicidal ideation. Thought content does not include suicidal plan.        Cognition and Memory: Cognition and memory normal.     Comments: Insight  and judgment fair.  Tendency to impulsive decisions 4/10 dep but better with Spravato  markedly. Affect blunted chronically     Lab Review:     Component Value Date/Time   NA 135 08/28/2023 0743   K 4.1 08/28/2023 0743   CL 97 08/28/2023 0743   CO2 32 08/28/2023 0743   GLUCOSE 92 08/28/2023 0743   BUN 17 08/28/2023 0743   CREATININE 0.61  08/28/2023 0743   CREATININE 0.68 01/26/2020 0920   CALCIUM 9.6 08/28/2023 0743   PROT 7.4 08/28/2023 0743   ALBUMIN 4.7 08/28/2023 0743   AST 60 (H) 08/28/2023 0743   ALT 73 (H) 08/28/2023 0743   ALKPHOS 54 08/28/2023 0743   BILITOT 0.3 08/28/2023 0743   GFRNONAA >90 05/01/2014 1645   GFRAA >90 05/01/2014 1645       Component Value Date/Time   WBC 3.1 (L) 08/28/2023 0743   RBC 4.07 08/28/2023 0743   HGB 13.2 08/28/2023 0743   HGB 14.1 10/26/2014 1034   HCT 39.8 08/28/2023 0743   PLT 254.0 08/28/2023 0743   MCV 97.8 08/28/2023 0743   MCH 34.4 (H) 01/26/2020 0920   MCHC 33.2 08/28/2023 0743   RDW 13.3 08/28/2023 0743   LYMPHSABS 1.1 08/28/2023 0743   MONOABS 0.3 08/28/2023 0743   EOSABS 0.1 08/28/2023 0743   BASOSABS 0.0 08/28/2023 0743    No results found for: POCLITH, LITHIUM    No results found for: PHENYTOIN, PHENOBARB, VALPROATE, CBMZ   .res Assessment: Plan:    Reita was seen today for follow-up, depression and anxiety.  Diagnoses and all orders for this visit:  Recurrent major depression resistant to treatment (HCC) -     venlafaxine  XR (EFFEXOR -XR) 75 MG 24 hr capsule; Take 3 capsules (225 mg total) by mouth daily with breakfast.  Generalized anxiety disorder -     venlafaxine  XR (EFFEXOR -XR) 75 MG 24 hr capsule; Take 3 capsules (225 mg total) by mouth daily with breakfast.  Insomnia, unspecified type -     Suvorexant  (BELSOMRA ) 20 MG TABS; Take 1 tablet (20 mg total) by mouth at bedtime.  Attention deficit hyperactivity disorder (ADHD), predominantly inattentive type -     Dextroamphetamine  (XELSTRYM ) 18 MG/9HR PTCH; Place 1 patch onto the skin every morning.  Tremor due to multiple drugs       Pt seen for TRD with multiple failed medications as noted above.    Mood 75% better with venlafaxine  with Spravato  but ongoing dep with anhedonia and fatigue.    There is an ongoing pattern of her making impulsive decisions on her own about  meds which are counter therapeutic and not informing MD of what is going on until after the fact.  That is not a helpful pattern and she is encouraged to stop it.   There isongoing problem.  Stopped Spravato  per her request despite being instructed of potential relapse.  Consider switch Spravato  to MAOI, Parnate  We discussed the short-term risks associated with benzodiazepines including sedation and increased fall risk among others.  Discussed long-term side effect risk including dependence, potential withdrawal symptoms, and the potential eventual dose-related risk of dementia.  But recent studies from 2020 dispute this association between benzodiazepines and dementia risk. Newer studies in 2020 do not support an association with dementia.  for insomnia, wants to continue clonazepam  2 mg nightly. Can't sleep without it. Wants to continue trazodone  also  Reports needing gabapentin  [phone] mg HS to help sleep.   Conisder switching to patch bc pt has significant crash with stimulants or perhaps [place].  Stimulant tablets cause crash with inadequate duration .  Patches should provide smoother response. The following is finally approved and she will try it. continue  Discussed potential benefits, risks, and side effects of stimulants with patient to include increased heart rate, palpitations, insomnia, increased anxiety, increased irritability, or decreased appetite.  Instructed patient to contact office if experiencing any significant tolerability issues.  Plan: continue Clonazepam  1 to 1.5 mg nightly, lithium  CR 300 nightly,  gabapentin  900 mg HS, trazodone  50 HS.  Per her request reduce  venlafaxine  XR to 225 mg daily ? Reasons Increase  Xelstrym  bc it is helping more than any other stimulant she tried with some mood benefit but ok to increae to max dose  Disc decisions about new insurance which will happen end of June.  Disc making sure it will cover current tx plan and complications if it  doesn't.  FU 8 weeks  Nori Beat, MD, DFAPA    Please see After Visit Summary for patient specific instructions.  Future Appointments  Date Time Provider Department Center  12/10/2023 10:00 AM Cottle, Kennedy Peabody., MD [facility] None    No orders of the defined types were placed in this encounter.   -------------------------------

## 2023-10-27 ENCOUNTER — Ambulatory Visit: Admitting: Psychiatry

## 2023-11-13 ENCOUNTER — Other Ambulatory Visit: Payer: Self-pay | Admitting: Psychiatry

## 2023-11-13 DIAGNOSIS — F411 Generalized anxiety disorder: Secondary | ICD-10-CM

## 2023-11-13 DIAGNOSIS — F339 Major depressive disorder, recurrent, unspecified: Secondary | ICD-10-CM

## 2023-12-09 ENCOUNTER — Other Ambulatory Visit (HOSPITAL_BASED_OUTPATIENT_CLINIC_OR_DEPARTMENT_OTHER): Payer: Self-pay | Admitting: Obstetrics & Gynecology

## 2023-12-09 DIAGNOSIS — N898 Other specified noninflammatory disorders of vagina: Secondary | ICD-10-CM

## 2023-12-10 ENCOUNTER — Ambulatory Visit: Admitting: Psychiatry

## 2023-12-11 NOTE — Telephone Encounter (Signed)
 LMOM at 12:39 for patient to call office. Would like to know if she requested this RX. tbw

## 2023-12-14 ENCOUNTER — Other Ambulatory Visit (HOSPITAL_BASED_OUTPATIENT_CLINIC_OR_DEPARTMENT_OTHER): Payer: Self-pay

## 2023-12-14 DIAGNOSIS — A6009 Herpesviral infection of other urogenital tract: Secondary | ICD-10-CM

## 2023-12-14 MED ORDER — VALACYCLOVIR HCL 500 MG PO TABS: ORAL_TABLET | ORAL | 0 refills | Status: AC

## 2024-02-08 ENCOUNTER — Ambulatory Visit: Payer: Self-pay | Admitting: Psychiatry
